# Patient Record
Sex: Male | Born: 1940 | Race: White | Hispanic: No | Marital: Married | Smoking: Former smoker
Health system: Southern US, Community
[De-identification: ages and names within clinical notes are randomized; demographics above are authoritative.]

## PROBLEM LIST (undated history)

## (undated) DIAGNOSIS — M1990 Unspecified osteoarthritis, unspecified site: Secondary | ICD-10-CM

## (undated) DIAGNOSIS — I351 Nonrheumatic aortic (valve) insufficiency: Secondary | ICD-10-CM

## (undated) DIAGNOSIS — E785 Hyperlipidemia, unspecified: Secondary | ICD-10-CM

## (undated) DIAGNOSIS — I82409 Acute embolism and thrombosis of unspecified deep veins of unspecified lower extremity: Secondary | ICD-10-CM

## (undated) DIAGNOSIS — Z720 Tobacco use: Secondary | ICD-10-CM

## (undated) DIAGNOSIS — I2510 Atherosclerotic heart disease of native coronary artery without angina pectoris: Secondary | ICD-10-CM

## (undated) DIAGNOSIS — IMO0001 Reserved for inherently not codable concepts without codable children: Secondary | ICD-10-CM

## (undated) DIAGNOSIS — I719 Aortic aneurysm of unspecified site, without rupture: Secondary | ICD-10-CM

## (undated) DIAGNOSIS — I4892 Unspecified atrial flutter: Secondary | ICD-10-CM

## (undated) DIAGNOSIS — I10 Essential (primary) hypertension: Secondary | ICD-10-CM

## (undated) DIAGNOSIS — Z9289 Personal history of other medical treatment: Secondary | ICD-10-CM

## (undated) DIAGNOSIS — I509 Heart failure, unspecified: Secondary | ICD-10-CM

## (undated) HISTORY — DX: Essential (primary) hypertension: I10

## (undated) HISTORY — DX: Aortic aneurysm of unspecified site, without rupture: I71.9

## (undated) HISTORY — PX: REPLACEMENT TOTAL KNEE: SUR1224

## (undated) HISTORY — PX: THORACENTESIS: SHX235

## (undated) HISTORY — PX: CARDIAC VALVE REPLACEMENT: SHX585

## (undated) HISTORY — DX: Tobacco use: Z72.0

## (undated) HISTORY — DX: Nonrheumatic aortic (valve) insufficiency: I35.1

## (undated) HISTORY — DX: Hyperlipidemia, unspecified: E78.5

## (undated) HISTORY — PX: CATARACT EXTRACTION W/ INTRAOCULAR LENS  IMPLANT, BILATERAL: SHX1307

## (undated) HISTORY — DX: Acute embolism and thrombosis of unspecified deep veins of unspecified lower extremity: I82.409

## (undated) HISTORY — PX: TONSILLECTOMY: SUR1361

## (undated) HISTORY — PX: EYE SURGERY: SHX253

## (undated) HISTORY — PX: JOINT REPLACEMENT: SHX530

## (undated) HISTORY — DX: Atherosclerotic heart disease of native coronary artery without angina pectoris: I25.10

## (undated) HISTORY — DX: Reserved for inherently not codable concepts without codable children: IMO0001

## (undated) HISTORY — PX: KNEE ARTHROSCOPY: SHX127

---

## 2009-02-18 ENCOUNTER — Encounter (INDEPENDENT_AMBULATORY_CARE_PROVIDER_SITE_OTHER): Payer: Self-pay | Admitting: Interventional Cardiology

## 2009-02-18 ENCOUNTER — Inpatient Hospital Stay (HOSPITAL_COMMUNITY): Admission: EM | Admit: 2009-02-18 | Discharge: 2009-02-28 | Payer: Self-pay | Admitting: Interventional Cardiology

## 2009-02-18 ENCOUNTER — Ambulatory Visit: Payer: Self-pay | Admitting: Cardiothoracic Surgery

## 2009-02-18 ENCOUNTER — Ambulatory Visit: Payer: Self-pay | Admitting: Pulmonary Disease

## 2009-02-18 ENCOUNTER — Encounter: Payer: Self-pay | Admitting: Cardiothoracic Surgery

## 2009-02-18 ENCOUNTER — Encounter (INDEPENDENT_AMBULATORY_CARE_PROVIDER_SITE_OTHER): Payer: Self-pay | Admitting: Cardiology

## 2009-02-19 ENCOUNTER — Encounter: Payer: Self-pay | Admitting: Cardiothoracic Surgery

## 2009-02-19 HISTORY — PX: AORTIC VALVE REPLACEMENT: SHX41

## 2009-02-23 ENCOUNTER — Encounter: Payer: Self-pay | Admitting: Cardiothoracic Surgery

## 2009-03-25 ENCOUNTER — Ambulatory Visit: Payer: Self-pay | Admitting: Cardiothoracic Surgery

## 2009-03-25 ENCOUNTER — Encounter: Admission: RE | Admit: 2009-03-25 | Discharge: 2009-03-25 | Payer: Self-pay | Admitting: Cardiothoracic Surgery

## 2009-03-25 ENCOUNTER — Ambulatory Visit (HOSPITAL_COMMUNITY): Admission: RE | Admit: 2009-03-25 | Discharge: 2009-03-25 | Payer: Self-pay | Admitting: Cardiothoracic Surgery

## 2009-03-25 ENCOUNTER — Encounter: Payer: Self-pay | Admitting: Cardiothoracic Surgery

## 2009-03-29 ENCOUNTER — Ambulatory Visit (HOSPITAL_COMMUNITY): Admission: RE | Admit: 2009-03-29 | Discharge: 2009-03-29 | Payer: Self-pay | Admitting: Cardiothoracic Surgery

## 2009-03-29 ENCOUNTER — Encounter (INDEPENDENT_AMBULATORY_CARE_PROVIDER_SITE_OTHER): Payer: Self-pay | Admitting: Interventional Radiology

## 2009-03-29 ENCOUNTER — Ambulatory Visit: Payer: Self-pay | Admitting: Cardiothoracic Surgery

## 2009-03-29 HISTORY — PX: OTHER SURGICAL HISTORY: SHX169

## 2009-04-01 ENCOUNTER — Ambulatory Visit: Payer: Self-pay | Admitting: Cardiothoracic Surgery

## 2009-04-01 ENCOUNTER — Encounter: Admission: RE | Admit: 2009-04-01 | Discharge: 2009-04-01 | Payer: Self-pay | Admitting: Cardiothoracic Surgery

## 2009-04-08 ENCOUNTER — Ambulatory Visit: Payer: Self-pay | Admitting: Cardiothoracic Surgery

## 2009-04-08 ENCOUNTER — Encounter: Admission: RE | Admit: 2009-04-08 | Discharge: 2009-04-08 | Payer: Self-pay | Admitting: Cardiothoracic Surgery

## 2009-04-13 ENCOUNTER — Ambulatory Visit (HOSPITAL_COMMUNITY): Admission: RE | Admit: 2009-04-13 | Discharge: 2009-04-13 | Payer: Self-pay | Admitting: Cardiothoracic Surgery

## 2009-04-22 ENCOUNTER — Encounter: Admission: RE | Admit: 2009-04-22 | Discharge: 2009-04-22 | Payer: Self-pay | Admitting: Cardiothoracic Surgery

## 2009-04-22 ENCOUNTER — Ambulatory Visit: Payer: Self-pay | Admitting: Cardiothoracic Surgery

## 2009-05-06 ENCOUNTER — Encounter: Admission: RE | Admit: 2009-05-06 | Discharge: 2009-05-06 | Payer: Self-pay | Admitting: Cardiothoracic Surgery

## 2009-05-06 ENCOUNTER — Ambulatory Visit: Payer: Self-pay | Admitting: Cardiothoracic Surgery

## 2009-08-12 ENCOUNTER — Encounter: Admission: RE | Admit: 2009-08-12 | Discharge: 2009-08-12 | Payer: Self-pay | Admitting: Cardiothoracic Surgery

## 2009-08-12 ENCOUNTER — Ambulatory Visit: Payer: Self-pay | Admitting: Cardiothoracic Surgery

## 2010-03-19 HISTORY — PX: AORTO-FEMORAL BYPASS GRAFT: SHX885

## 2010-03-31 ENCOUNTER — Ambulatory Visit: Payer: Self-pay | Admitting: Cardiothoracic Surgery

## 2010-03-31 ENCOUNTER — Encounter: Admission: RE | Admit: 2010-03-31 | Discharge: 2010-03-31 | Payer: Self-pay | Admitting: Cardiothoracic Surgery

## 2010-07-28 ENCOUNTER — Emergency Department (HOSPITAL_COMMUNITY): Payer: Medicare Other

## 2010-07-28 ENCOUNTER — Inpatient Hospital Stay (HOSPITAL_COMMUNITY)
Admission: EM | Admit: 2010-07-28 | Discharge: 2010-07-31 | DRG: 249 | Disposition: A | Discharge status: Home / Self Care | Payer: Medicare Other | Attending: Interventional Cardiology | Admitting: Interventional Cardiology

## 2010-07-28 DIAGNOSIS — E785 Hyperlipidemia, unspecified: Secondary | ICD-10-CM | POA: Diagnosis present

## 2010-07-28 DIAGNOSIS — Z7982 Long term (current) use of aspirin: Secondary | ICD-10-CM

## 2010-07-28 DIAGNOSIS — I059 Rheumatic mitral valve disease, unspecified: Secondary | ICD-10-CM | POA: Diagnosis present

## 2010-07-28 DIAGNOSIS — Z7902 Long term (current) use of antithrombotics/antiplatelets: Secondary | ICD-10-CM

## 2010-07-28 DIAGNOSIS — Z954 Presence of other heart-valve replacement: Secondary | ICD-10-CM

## 2010-07-28 DIAGNOSIS — I10 Essential (primary) hypertension: Secondary | ICD-10-CM | POA: Diagnosis present

## 2010-07-28 DIAGNOSIS — I2582 Chronic total occlusion of coronary artery: Secondary | ICD-10-CM | POA: Diagnosis present

## 2010-07-28 DIAGNOSIS — Z87891 Personal history of nicotine dependence: Secondary | ICD-10-CM

## 2010-07-28 DIAGNOSIS — R091 Pleurisy: Secondary | ICD-10-CM | POA: Diagnosis present

## 2010-07-28 DIAGNOSIS — I2510 Atherosclerotic heart disease of native coronary artery without angina pectoris: Principal | ICD-10-CM | POA: Diagnosis present

## 2010-07-28 LAB — COMPREHENSIVE METABOLIC PANEL
ALT: 14 U/L (ref 0–53)
AST: 23 U/L (ref 0–37)
Albumin: 4.2 g/dL (ref 3.5–5.2)
Alkaline Phosphatase: 56 U/L (ref 39–117)
Calcium: 9.2 mg/dL (ref 8.4–10.5)
GFR calc Af Amer: >60 mL/min (ref >60)
Glucose, Bld: 164 mg/dL — ABNORMAL HIGH (ref 70–99)
Potassium: 3.4 mEq/L — ABNORMAL LOW (ref 3.5–5.1)
Sodium: 137 mEq/L (ref 135–145)
Total Protein: 6.9 g/dL (ref 6.0–8.3)

## 2010-07-28 LAB — CBC
HCT: 36.4 % — ABNORMAL LOW (ref 39.0–52.0)
Hemoglobin: 12 g/dL — ABNORMAL LOW (ref 13.0–17.0)
MCH: 29.3 pg (ref 26.0–34.0)
MCHC: 33 g/dL (ref 30.0–36.0)
MCV: 89 fL (ref 78.0–100.0)
RDW: 14.2 % (ref 11.5–15.5)

## 2010-07-28 LAB — APTT: aPTT: 31 seconds (ref 24–37)

## 2010-07-28 LAB — CK TOTAL AND CKMB (NOT AT ARMC)
CK, MB: 2.4 ng/mL (ref 0.3–4.0)
Total CK: 122 U/L (ref 7–232)

## 2010-07-28 LAB — TYPE AND SCREEN

## 2010-07-28 LAB — PROTIME-INR
INR: 1.03 (ref 0.00–1.49)
Prothrombin Time: 13.7 seconds (ref 11.6–15.2)

## 2010-07-28 LAB — BRAIN NATRIURETIC PEPTIDE: Pro B Natriuretic peptide (BNP): 151 pg/mL — ABNORMAL HIGH (ref 0.0–100.0)

## 2010-07-28 LAB — TROPONIN I: Troponin I: 0.01 ng/mL (ref 0.00–0.06)

## 2010-07-28 MED ORDER — IOHEXOL 300 MG/ML  SOLN
100.0000 mL | Freq: Once | INTRAMUSCULAR | Status: AC | PRN
  Administered 2010-07-28: 100 mL via INTRAVENOUS

## 2010-07-29 LAB — CARDIAC PANEL(CRET KIN+CKTOT+MB+TROPI)
CK, MB: 2 ng/mL (ref 0.3–4.0)
CK, MB: 3.5 ng/mL (ref 0.3–4.0)
Total CK: 95 U/L (ref 7–232)
Total CK: 102 U/L (ref 7–232)
Troponin I: 0.19 ng/mL — ABNORMAL HIGH (ref 0.00–0.06)

## 2010-07-29 LAB — DIFFERENTIAL
Basophils Relative: 0 % (ref 0–1)
Eosinophils Absolute: 0 10*3/uL (ref 0.0–0.7)
Eosinophils Relative: 0 % (ref 0–5)
Lymphs Abs: 0.7 10*3/uL (ref 0.7–4.0)

## 2010-07-29 LAB — CBC
MCH: 28.7 pg (ref 26.0–34.0)
MCH: 28.9 pg (ref 26.0–34.0)
MCHC: 32.2 g/dL (ref 30.0–36.0)
MCV: 87.9 fL (ref 78.0–100.0)
Platelets: 195 10*3/uL (ref 150–400)
Platelets: 208 10*3/uL (ref 150–400)
RBC: 3.53 MIL/uL — ABNORMAL LOW (ref 4.22–5.81)
RDW: 14.3 % (ref 11.5–15.5)
WBC: 6.5 10*3/uL (ref 4.0–10.5)

## 2010-07-29 LAB — BASIC METABOLIC PANEL
Calcium: 8.1 mg/dL — ABNORMAL LOW (ref 8.4–10.5)
Creatinine, Ser: 0.77 mg/dL (ref 0.4–1.5)
GFR calc Af Amer: >60 mL/min (ref >60)
GFR calc non Af Amer: >60 mL/min (ref >60)

## 2010-07-29 LAB — LIPID PANEL
Cholesterol: 136 mg/dL (ref 0–200)
LDL Cholesterol: 80 mg/dL (ref 0–99)

## 2010-07-29 LAB — HEMOGLOBIN A1C: Mean Plasma Glucose: 123 mg/dL — ABNORMAL HIGH (ref <117)

## 2010-07-29 LAB — MRSA PCR SCREENING: MRSA by PCR: NEGATIVE

## 2010-07-30 LAB — CBC
HCT: 32.6 % — ABNORMAL LOW (ref 39.0–52.0)
Hemoglobin: 10.5 g/dL — ABNORMAL LOW (ref 13.0–17.0)
MCH: 29.2 pg (ref 26.0–34.0)
MCHC: 32.2 g/dL (ref 30.0–36.0)

## 2010-07-30 LAB — BASIC METABOLIC PANEL
CO2: 28 mEq/L (ref 19–32)
Calcium: 8.7 mg/dL (ref 8.4–10.5)
Glucose, Bld: 111 mg/dL — ABNORMAL HIGH (ref 70–99)
Sodium: 142 mEq/L (ref 135–145)

## 2010-08-01 NOTE — Discharge Summary (Signed)
[name] [name], [name] [name]             ACCOUNT NO.:  [id number]  MEDICAL RECORD NO.:  [id number]           PATIENT TYPE:  I  LOCATION:  2031                         FACILITY:  [facility]  PHYSICIAN:  Jake Bathe, MD      DATE OF BIRTH:  [birth year]  DATE OF ADMISSION:  07/28/2010 DATE OF DISCHARGE:  07/31/2010                              DISCHARGE SUMMARY   CARDIOLOGIST:  [name], MD  FINAL DIAGNOSES: 1. Chest pain - pleuritic chest pain, cough, T-max was 100.1, resolved     with ibuprofen, improved. 2. Coronary artery disease - percutaneous intervention with bare-metal     stent to circumflex artery 4.0 x 23 mm Vision stent.  Mid right     coronary artery is occluded.  Widely patent left anterior     descending.  Left ventriculogram not performed during     catheterization; however, on echocardiogram, ejection fraction 50-     55% with mild focal basal hypertrophy of the septum. 3. 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 4. Hypertension. 5. Hyperlipidemia.  PERTINENT LABORATORIES:  On discharge, sodium 142, potassium 3.8, BUN 16, creatinine 0.8.  White count 7.7, hemoglobin 11.5, hematocrit 32.6, platelets 205, hemoglobin A1c 5.9.  TSH was 0.6 normal.  Cardiac panel, last set on March 29, 2011, at 9:43 a.m. demonstrating mildly elevated troponin at 0.19.  This was following intervention and with continued pleuritic chest pain.  CK-MB was normal.  All other troponins were normal.  LDL cholesterol was 80 with a goal of 70, triglycerides 89, HDL 38.  BNP on admission was 151.  CT angiogram of chest done on July 28, 2010, showed no evidence of PE.  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 Laneta Simmers.  No evidence of aortic dissection.  No pericardial or pleural  effusions.  No adenopathy.  HOSPITAL COURSE:  This 70 year old man with a complex cardiac history including a large ascending aortic aneurysm repair on February 19, 2009, with bioprosthetic aortic valve replacement and reimplantation of coronary arteries who presented with diaphoresis, chest pain, near syncope, and appearing quite ill.  He was taken to the cardiac catheterization lab where his circumflex artery was noted to be highly stenotic and a bare-metal stent was placed in this region as described above by Dr. Katrinka Blazing.  Following the procedure, he still continued to have pleuritic-type chest pain that was worse with deep inspiration and cough.  He had no evidence of dissection or PE on CT scan and no evidence of pericardial effusion on CT scan.  Throughout the hospitalization, his pleuritic-like chest pain began to dissipate and interestingly on Saturday day prior to discharge in the early morning hours, he did develop a minor temperature of 100.1, which has resolved on discharge.  White count was all normal.  Ibuprofen 800 mg 3 times a day was administered with an adequate response.  On the day of discharge, he was able to ambulate the hallway without any difficulty and was feeling  quite better.  Actually on Saturday, day prior to discharge, he was looking remarkably better.  Just prior to the hospitalization, he lost his very close family member.  With bare-metal stent, Plavix is mandatory for at least 1 month, but optimally for 1 year.  His right coronary artery also was occluded in the mid vessel with right to right collateralization noted. Echocardiogram showed low normal ejection fraction overall. Bioprosthetic aortic valve was functioning normally.  DISCHARGE MEDICATIONS: 1. Aspirin 325 mg once a day. 2. Clopidogrel 75 mg once a day. 3. Ibuprofen 800 mg 3 times a day for 5 days. 4. Metoprolol 50 mg twice a day (this is an increased from 25 twice a     day). 5.  Nitroglycerin 0.4 mg p.r.n. 6. Frusemide 40 mg a day. 7. Loratadine 10 mg a day. 8. Norvasc 5 mg a day. 9. Potassium chloride 20 mEq a day. 10.Prevacid 30 mg a day. 11.Vasotec or enalapril 5 mg a day. 12.Zocor or simvastatin 20 mg a day.  On discharge, his blood pressure was 109/60 to 116/64, temperature was 98.7, pulse was 70.  Telemetry was normal without any arrhythmias and he is satting 95% on room air, ambulating well.  There was no rub on physical exam.  Lungs were clear.  Groin was clean, dry, and intact.  DISCHARGE INSTRUCTIONS:  He knows to perform postcatheterization instructions per protocol and to return to clinic in 1 week to see [name].  He knows to contact us if any worrisome symptoms develop.  Discharge time 35 minutes spent with the patient, med reconciliation, medication review, lab review, and chart review.     Jake Bathe, MD     MCS/MEDQ  D:  07/31/2010  T:  08/01/2010  Job:  [id number]  Electronically Signed by Donato Schultz MD on 08/01/2010 06:36:39 AM

## 2010-08-26 NOTE — Procedures (Signed)
NAMECLEAVEN, DEMARIO NO.:  [id number]  MEDICAL RECORD NO.:  [id number]           PATIENT TYPE:  I  LOCATION:  2905                         FACILITY:  [facility]  PHYSICIAN:  Lyn Records, M.D.   DATE OF BIRTH:  13-Mar-1941  DATE OF PROCEDURE:  07/28/2010 DATE OF DISCHARGE:                           CARDIAC CATHETERIZATION   CARDIAC CATHETERIZATION AND PERCUTANEOUS CORONARY INTERVENTION NOTE  INDICATION:  The patient is a 70 year old gentleman with a complex cardiac history including prior ascending aortic root resection including partial arch resection for aortic aneurysm.  He also has a bioprosthetic aortic valve done at the same time in 2010.  This afternoon at around 1:30, he suddenly developed severe discomfort beginning in his chest and radiating through to his back.  He became ashen, diaphoretic, and dyspneic.  He came to the emergency room where multiple procedures were done including a CT angio which ruled out pulmonary emboli and aortic dissection.  Cardiology was eventually called at around 6:30 p.m., and the patient was still having chest pain. He had mild diffuse ST-segment depression.  IV nitroglycerin and heparin were administered.  The pain lessened some but continued.  Because of this, he is brought to the cath lab urgently for definition of coronary anatomy.  My concern was that he may have ostial coronary stenosis at the coronary reimplantation sites.  PROCEDURES PERFORMED: 1. Left heart cath. 2. Selective left and right coronary angiography. 3. No ventriculography. 4. Bare-metal stent, mid circumflex.  DESCRIPTION:  Left femoral was used.  A 1% Xylocaine was used for local anesthesia.  We then performed diagnostic catheterization with 5-French equipment.  A 5-French sheath was placed using the modified Seldinger technique.  A 5-French #4 left Judkins catheter was used for left coronary angiography.  Because of the orientation of the  patient's aorta, cannulation of the left coronary ostia was difficult.  Left main was also short, so we had difficulty visualizing the circumflex. Multiple catheter changes were performed, but we were ultimately able to visualize the circumflex using a 4.5, 6-French guide catheter, left Judkins.  We used a no torque right for right coronary angiography. After studying the anatomy which demonstrated total occlusion of the RCA and a high-grade stenosis in the mid circumflex with a widely patent LAD, I felt that PCI would be the treatment of choice.  I discussed this with the patient and family and because he was having persisting pain, we decided to proceed with the procedure which would include stenting of the circumflex.  We gave a bolus followed by an infusion of Angiomax.  Versed and fentanyl were given for sedation.  Please see the medication log.  We then tried multiple catheters in an attempt to get a good guide seating.  We ultimately ended up using an SB 4.0, 6-French left coronary system guide catheter.  The BMW wire was then used to cross the stenosis.  Predilatation was performed with a 3.5 x 59-mm Apex.  A 23-mm long x 4.0 mm diameter Vision stent was deployed to 15 atmospheres. Postdilatation was performed with a 4.5 x 20 [place] Apex to 13 atmospheres. Brisk flow  was noted.  No complications occurred.  The guide catheter and wire were removed.  The sheath was sewn in place.  Angiomax was discontinued.  The patient was taken to the coronary care unit in stable condition.  RESULTS: 1. Hemodynamic data:     a.     Aortic pressure 134/66.     b.     Left ventricular pressure:  Left ventriculography and      hemodynamic recordings were not performed. 2. Coronary angiography.     a.     The left main coronary:  Short but widely patent.     b.     Left anterior descending coronary:  Widely patent.     c.     Circumflex coronary artery:  Mid vessel segmental 95%      eccentric  stenosis with a large distal distribution and      collaterals to the right coronary.     d.     Right coronary:  A 100% midvessel stenosis with left-to-      right and right-to-right collaterals noted. 3. Circumflex PCI:  The circumflex lesion is eccentric and greater     than 90% with TIMI grade 3 flow.  After ballooning and deployment     of a 4.0 x 23 Vision stent, postdilated to 4.5 mm, 0% stenosis was     noted with TIMI grade 3 flow.  CONCLUSIONS: 1. Acute coronary syndrome due to high-grade obstruction in the     midcircumflex. 2. Successful bare-metal stenting of the mid circumflex from 95% to 0%     with TIMI grade 3 flow. 3. A 100% mid right coronary artery. 4. Left ventriculography not performed. 5. Widely patent left anterior descending.  PLAN:  Aspirin and Plavix.  Angiomax is discontinued.     Lyn Records, M.D.     HWS/MEDQ  D:  07/28/2010  T:  07/29/2010  Job:  [id number]  cc:   [name], MD  Electronically Signed by Verdis Prime M.D. on 08/25/2010 09:27:15 AM

## 2010-09-22 LAB — CULTURE, ROUTINE-ABSCESS
Culture: NO GROWTH
Gram Stain: NONE SEEN

## 2010-09-22 LAB — BODY FLUID CULTURE
Culture: NO GROWTH
Gram Stain: NONE SEEN

## 2010-09-22 LAB — PROTIME-INR
INR: 1.11 (ref 0.00–1.49)
INR: 1.28 (ref 0.00–1.49)
Prothrombin Time: 14.2 seconds (ref 11.6–15.2)
Prothrombin Time: 15.9 seconds — ABNORMAL HIGH (ref 11.6–15.2)

## 2010-09-23 LAB — BLOOD GAS, ARTERIAL
Acid-Base Excess: 0.6 mmol/L (ref 0.0–2.0)
Bicarbonate: 24.7 mEq/L — ABNORMAL HIGH (ref 20.0–24.0)
Drawn by: 275531
FIO2: 100 %
MECHVT: 550 mL
O2 Saturation: 100 %
O2 Saturation: 98.2 %
PEEP: 5 cmH2O
Patient temperature: 98.6
Patient temperature: 98.6
RATE: 18 resp/min
TCO2: 24.8 mmol/L (ref 0–100)
TCO2: 25.5 mmol/L (ref 0–100)
pCO2 arterial: 32.7 mmHg — ABNORMAL LOW (ref 35.0–45.0)
pH, Arterial: 7.561 — ABNORMAL HIGH (ref 7.350–7.450)
pO2, Arterial: 82.5 mmHg (ref 80.0–100.0)

## 2010-09-23 LAB — TYPE AND SCREEN
ABO/RH(D): A POS
Antibody Screen: NEGATIVE

## 2010-09-23 LAB — GLUCOSE, CAPILLARY
Glucose-Capillary: 102 mg/dL — ABNORMAL HIGH (ref 70–99)
Glucose-Capillary: 104 mg/dL — ABNORMAL HIGH (ref 70–99)
Glucose-Capillary: 120 mg/dL — ABNORMAL HIGH (ref 70–99)
Glucose-Capillary: 120 mg/dL — ABNORMAL HIGH (ref 70–99)
Glucose-Capillary: 125 mg/dL — ABNORMAL HIGH (ref 70–99)
Glucose-Capillary: 129 mg/dL — ABNORMAL HIGH (ref 70–99)
Glucose-Capillary: 130 mg/dL — ABNORMAL HIGH (ref 70–99)
Glucose-Capillary: 133 mg/dL — ABNORMAL HIGH (ref 70–99)
Glucose-Capillary: 133 mg/dL — ABNORMAL HIGH (ref 70–99)
Glucose-Capillary: 139 mg/dL — ABNORMAL HIGH (ref 70–99)
Glucose-Capillary: 146 mg/dL — ABNORMAL HIGH (ref 70–99)
Glucose-Capillary: 78 mg/dL (ref 70–99)
Glucose-Capillary: 87 mg/dL (ref 70–99)
Glucose-Capillary: 97 mg/dL (ref 70–99)
Glucose-Capillary: 99 mg/dL (ref 70–99)

## 2010-09-23 LAB — CBC
HCT: 27.7 % — ABNORMAL LOW (ref 39.0–52.0)
HCT: 27.7 % — ABNORMAL LOW (ref 39.0–52.0)
HCT: 27.9 % — ABNORMAL LOW (ref 39.0–52.0)
HCT: 28.1 % — ABNORMAL LOW (ref 39.0–52.0)
HCT: 29 % — ABNORMAL LOW (ref 39.0–52.0)
HCT: 29.4 % — ABNORMAL LOW (ref 39.0–52.0)
HCT: 30 % — ABNORMAL LOW (ref 39.0–52.0)
HCT: 32.7 % — ABNORMAL LOW (ref 39.0–52.0)
HCT: 37 % — ABNORMAL LOW (ref 39.0–52.0)
Hemoglobin: 10.1 g/dL — ABNORMAL LOW (ref 13.0–17.0)
Hemoglobin: 10.1 g/dL — ABNORMAL LOW (ref 13.0–17.0)
Hemoglobin: 10.5 g/dL — ABNORMAL LOW (ref 13.0–17.0)
Hemoglobin: 11 g/dL — ABNORMAL LOW (ref 13.0–17.0)
Hemoglobin: 8.4 g/dL — ABNORMAL LOW (ref 13.0–17.0)
Hemoglobin: 9.3 g/dL — ABNORMAL LOW (ref 13.0–17.0)
Hemoglobin: 9.4 g/dL — ABNORMAL LOW (ref 13.0–17.0)
Hemoglobin: 9.4 g/dL — ABNORMAL LOW (ref 13.0–17.0)
Hemoglobin: 9.4 g/dL — ABNORMAL LOW (ref 13.0–17.0)
Hemoglobin: 9.9 g/dL — ABNORMAL LOW (ref 13.0–17.0)
MCHC: 33.1 g/dL (ref 30.0–36.0)
MCHC: 33.3 g/dL (ref 30.0–36.0)
MCHC: 33.4 g/dL (ref 30.0–36.0)
MCHC: 33.5 g/dL (ref 30.0–36.0)
MCHC: 33.6 g/dL (ref 30.0–36.0)
MCHC: 33.7 g/dL (ref 30.0–36.0)
MCHC: 33.7 g/dL (ref 30.0–36.0)
MCHC: 33.7 g/dL (ref 30.0–36.0)
MCHC: 33.9 g/dL (ref 30.0–36.0)
MCHC: 34.1 g/dL (ref 30.0–36.0)
MCHC: 34.3 g/dL (ref 30.0–36.0)
MCV: 94.4 fL (ref 78.0–100.0)
MCV: 95.3 fL (ref 78.0–100.0)
MCV: 95.3 fL (ref 78.0–100.0)
MCV: 95.3 fL (ref 78.0–100.0)
MCV: 95.3 fL (ref 78.0–100.0)
MCV: 95.5 fL (ref 78.0–100.0)
MCV: 96.1 fL (ref 78.0–100.0)
MCV: 96.2 fL (ref 78.0–100.0)
MCV: 96.3 fL (ref 78.0–100.0)
MCV: 96.3 fL (ref 78.0–100.0)
MCV: 96.5 fL (ref 78.0–100.0)
Platelets: 104 10*3/uL — ABNORMAL LOW (ref 150–400)
Platelets: 139 10*3/uL — ABNORMAL LOW (ref 150–400)
Platelets: 260 10*3/uL (ref 150–400)
Platelets: 68 10*3/uL — ABNORMAL LOW (ref 150–400)
Platelets: 74 10*3/uL — ABNORMAL LOW (ref 150–400)
Platelets: 76 10*3/uL — ABNORMAL LOW (ref 150–400)
Platelets: 86 10*3/uL — ABNORMAL LOW (ref 150–400)
Platelets: 87 10*3/uL — ABNORMAL LOW (ref 150–400)
Platelets: 96 10*3/uL — ABNORMAL LOW (ref 150–400)
RBC: 2.65 MIL/uL — ABNORMAL LOW (ref 4.22–5.81)
RBC: 2.87 MIL/uL — ABNORMAL LOW (ref 4.22–5.81)
RBC: 2.9 MIL/uL — ABNORMAL LOW (ref 4.22–5.81)
RBC: 2.9 MIL/uL — ABNORMAL LOW (ref 4.22–5.81)
RBC: 2.95 MIL/uL — ABNORMAL LOW (ref 4.22–5.81)
RBC: 3.01 MIL/uL — ABNORMAL LOW (ref 4.22–5.81)
RBC: 3.12 MIL/uL — ABNORMAL LOW (ref 4.22–5.81)
RBC: 3.15 MIL/uL — ABNORMAL LOW (ref 4.22–5.81)
RBC: 3.22 MIL/uL — ABNORMAL LOW (ref 4.22–5.81)
RBC: 3.43 MIL/uL — ABNORMAL LOW (ref 4.22–5.81)
RDW: 14.1 % (ref 11.5–15.5)
RDW: 14.2 % (ref 11.5–15.5)
RDW: 14.6 % (ref 11.5–15.5)
RDW: 14.7 % (ref 11.5–15.5)
RDW: 14.8 % (ref 11.5–15.5)
RDW: 14.8 % (ref 11.5–15.5)
RDW: 15 % (ref 11.5–15.5)
RDW: 15.2 % (ref 11.5–15.5)
RDW: 15.2 % (ref 11.5–15.5)
RDW: 15.5 % (ref 11.5–15.5)
RDW: 15.6 % — ABNORMAL HIGH (ref 11.5–15.5)
WBC: 10.8 10*3/uL — ABNORMAL HIGH (ref 4.0–10.5)
WBC: 11.1 10*3/uL — ABNORMAL HIGH (ref 4.0–10.5)
WBC: 11.2 10*3/uL — ABNORMAL HIGH (ref 4.0–10.5)
WBC: 11.7 10*3/uL — ABNORMAL HIGH (ref 4.0–10.5)
WBC: 12 10*3/uL — ABNORMAL HIGH (ref 4.0–10.5)
WBC: 12.8 10*3/uL — ABNORMAL HIGH (ref 4.0–10.5)
WBC: 14.5 10*3/uL — ABNORMAL HIGH (ref 4.0–10.5)
WBC: 9.1 10*3/uL (ref 4.0–10.5)

## 2010-09-23 LAB — URINE MICROSCOPIC-ADD ON

## 2010-09-23 LAB — POCT I-STAT 3, ART BLOOD GAS (G3+)
Acid-base deficit: 1 mmol/L (ref 0.0–2.0)
Acid-base deficit: 2 mmol/L (ref 0.0–2.0)
Acid-base deficit: 3 mmol/L — ABNORMAL HIGH (ref 0.0–2.0)
Acid-base deficit: 7 mmol/L — ABNORMAL HIGH (ref 0.0–2.0)
Bicarbonate: 22.2 mEq/L (ref 20.0–24.0)
Bicarbonate: 23.7 mEq/L (ref 20.0–24.0)
Bicarbonate: 24.8 mEq/L — ABNORMAL HIGH (ref 20.0–24.0)
Bicarbonate: 24.9 mEq/L — ABNORMAL HIGH (ref 20.0–24.0)
Bicarbonate: 25.5 mEq/L — ABNORMAL HIGH (ref 20.0–24.0)
O2 Saturation: 100 %
O2 Saturation: 100 %
O2 Saturation: 100 %
O2 Saturation: 96 %
O2 Saturation: 99 %
Patient temperature: 36.6
Patient temperature: 37.2
TCO2: 24 mmol/L (ref 0–100)
TCO2: 25 mmol/L (ref 0–100)
TCO2: 26 mmol/L (ref 0–100)
TCO2: 26 mmol/L (ref 0–100)
TCO2: 27 mmol/L (ref 0–100)
pCO2 arterial: 29.4 mmHg — ABNORMAL LOW (ref 35.0–45.0)
pCO2 arterial: 48.7 mmHg — ABNORMAL HIGH (ref 35.0–45.0)
pCO2 arterial: 61.9 mmHg (ref 35.0–45.0)
pH, Arterial: 7.515 — ABNORMAL HIGH (ref 7.350–7.450)
pO2, Arterial: 235 mmHg — ABNORMAL HIGH (ref 80.0–100.0)
pO2, Arterial: 82 mmHg (ref 80.0–100.0)

## 2010-09-23 LAB — PREPARE FRESH FROZEN PLASMA

## 2010-09-23 LAB — COMPREHENSIVE METABOLIC PANEL
ALT: 47 U/L (ref 0–53)
ALT: 50 U/L (ref 0–53)
AST: 140 U/L — ABNORMAL HIGH (ref 0–37)
AST: 316 U/L — ABNORMAL HIGH (ref 0–37)
Albumin: 2.5 g/dL — ABNORMAL LOW (ref 3.5–5.2)
Albumin: 2.5 g/dL — ABNORMAL LOW (ref 3.5–5.2)
Albumin: 3.6 g/dL (ref 3.5–5.2)
Alkaline Phosphatase: 32 U/L — ABNORMAL LOW (ref 39–117)
Alkaline Phosphatase: 33 U/L — ABNORMAL LOW (ref 39–117)
BUN: 20 mg/dL (ref 6–23)
BUN: 32 mg/dL — ABNORMAL HIGH (ref 6–23)
BUN: 9 mg/dL (ref 6–23)
CO2: 25 mEq/L (ref 19–32)
CO2: 27 mEq/L (ref 19–32)
Calcium: 7.4 mg/dL — ABNORMAL LOW (ref 8.4–10.5)
Calcium: 7.5 mg/dL — ABNORMAL LOW (ref 8.4–10.5)
Calcium: 8.4 mg/dL (ref 8.4–10.5)
Chloride: 105 mEq/L (ref 96–112)
Chloride: 108 mEq/L (ref 96–112)
Chloride: 110 mEq/L (ref 96–112)
Creatinine, Ser: 0.67 mg/dL (ref 0.4–1.5)
Creatinine, Ser: 0.98 mg/dL (ref 0.4–1.5)
Creatinine, Ser: 1.3 mg/dL (ref 0.4–1.5)
GFR calc Af Amer: >60 mL/min (ref >60)
GFR calc Af Amer: >60 mL/min (ref >60)
GFR calc Af Amer: >60 mL/min (ref >60)
GFR calc non Af Amer: 55 mL/min — ABNORMAL LOW (ref >60)
GFR calc non Af Amer: >60 mL/min (ref >60)
Glucose, Bld: 120 mg/dL — ABNORMAL HIGH (ref 70–99)
Glucose, Bld: 133 mg/dL — ABNORMAL HIGH (ref 70–99)
Potassium: 3.5 mEq/L (ref 3.5–5.1)
Potassium: 4.2 mEq/L (ref 3.5–5.1)
Sodium: 141 mEq/L (ref 135–145)
Sodium: 142 mEq/L (ref 135–145)
Total Bilirubin: 0.7 mg/dL (ref 0.3–1.2)
Total Bilirubin: 0.8 mg/dL (ref 0.3–1.2)
Total Bilirubin: 1.9 mg/dL — ABNORMAL HIGH (ref 0.3–1.2)
Total Protein: 4.2 g/dL — ABNORMAL LOW (ref 6.0–8.3)
Total Protein: 4.7 g/dL — ABNORMAL LOW (ref 6.0–8.3)
Total Protein: 6.5 g/dL (ref 6.0–8.3)

## 2010-09-23 LAB — POCT I-STAT, CHEM 8
BUN: 12 mg/dL (ref 6–23)
BUN: 13 mg/dL (ref 6–23)
BUN: 21 mg/dL (ref 6–23)
BUN: 49 mg/dL — ABNORMAL HIGH (ref 6–23)
Calcium, Ion: 0.82 mmol/L — ABNORMAL LOW (ref 1.12–1.32)
Calcium, Ion: 1.02 mmol/L — ABNORMAL LOW (ref 1.12–1.32)
Calcium, Ion: 1.04 mmol/L — ABNORMAL LOW (ref 1.12–1.32)
Calcium, Ion: 1.06 mmol/L — ABNORMAL LOW (ref 1.12–1.32)
Chloride: 106 mEq/L (ref 96–112)
Chloride: 107 mEq/L (ref 96–112)
Chloride: 115 mEq/L — ABNORMAL HIGH (ref 96–112)
Creatinine, Ser: 0.8 mg/dL (ref 0.4–1.5)
Creatinine, Ser: 1.1 mg/dL (ref 0.4–1.5)
Glucose, Bld: 113 mg/dL — ABNORMAL HIGH (ref 70–99)
Glucose, Bld: 117 mg/dL — ABNORMAL HIGH (ref 70–99)
Glucose, Bld: 159 mg/dL — ABNORMAL HIGH (ref 70–99)
HCT: 29 % — ABNORMAL LOW (ref 39.0–52.0)
HCT: 29 % — ABNORMAL LOW (ref 39.0–52.0)
HCT: 31 % — ABNORMAL LOW (ref 39.0–52.0)
Hemoglobin: 10.5 g/dL — ABNORMAL LOW (ref 13.0–17.0)
Hemoglobin: 12.2 g/dL — ABNORMAL LOW (ref 13.0–17.0)
Potassium: 3.3 mEq/L — ABNORMAL LOW (ref 3.5–5.1)
Potassium: 3.5 mEq/L (ref 3.5–5.1)
Potassium: 4.2 mEq/L (ref 3.5–5.1)
Sodium: 138 mEq/L (ref 135–145)
Sodium: 141 mEq/L (ref 135–145)
TCO2: 16 mmol/L (ref 0–100)
TCO2: 23 mmol/L (ref 0–100)
TCO2: 25 mmol/L (ref 0–100)
TCO2: 25 mmol/L (ref 0–100)

## 2010-09-23 LAB — BASIC METABOLIC PANEL
BUN: 19 mg/dL (ref 6–23)
BUN: 27 mg/dL — ABNORMAL HIGH (ref 6–23)
BUN: 36 mg/dL — ABNORMAL HIGH (ref 6–23)
BUN: 47 mg/dL — ABNORMAL HIGH (ref 6–23)
BUN: 48 mg/dL — ABNORMAL HIGH (ref 6–23)
CO2: 24 mEq/L (ref 19–32)
CO2: 27 mEq/L (ref 19–32)
CO2: 27 mEq/L (ref 19–32)
CO2: 28 mEq/L (ref 19–32)
CO2: 29 mEq/L (ref 19–32)
CO2: 30 mEq/L (ref 19–32)
CO2: 32 mEq/L (ref 19–32)
Calcium: 7.4 mg/dL — ABNORMAL LOW (ref 8.4–10.5)
Calcium: 7.5 mg/dL — ABNORMAL LOW (ref 8.4–10.5)
Calcium: 7.6 mg/dL — ABNORMAL LOW (ref 8.4–10.5)
Calcium: 7.9 mg/dL — ABNORMAL LOW (ref 8.4–10.5)
Calcium: 8.1 mg/dL — ABNORMAL LOW (ref 8.4–10.5)
Calcium: 8.4 mg/dL (ref 8.4–10.5)
Chloride: 105 mEq/L (ref 96–112)
Chloride: 105 mEq/L (ref 96–112)
Chloride: 106 mEq/L (ref 96–112)
Chloride: 108 mEq/L (ref 96–112)
Chloride: 109 mEq/L (ref 96–112)
Chloride: 109 mEq/L (ref 96–112)
Creatinine, Ser: 0.87 mg/dL (ref 0.4–1.5)
Creatinine, Ser: 0.87 mg/dL (ref 0.4–1.5)
Creatinine, Ser: 0.91 mg/dL (ref 0.4–1.5)
Creatinine, Ser: 1.03 mg/dL (ref 0.4–1.5)
Creatinine, Ser: 1.11 mg/dL (ref 0.4–1.5)
Creatinine, Ser: 1.45 mg/dL (ref 0.4–1.5)
Creatinine, Ser: 1.71 mg/dL — ABNORMAL HIGH (ref 0.4–1.5)
GFR calc Af Amer: 48 mL/min — ABNORMAL LOW (ref >60)
GFR calc Af Amer: 58 mL/min — ABNORMAL LOW (ref >60)
GFR calc Af Amer: >60 mL/min (ref >60)
GFR calc Af Amer: >60 mL/min (ref >60)
GFR calc Af Amer: >60 mL/min (ref >60)
GFR calc Af Amer: >60 mL/min (ref >60)
GFR calc Af Amer: >60 mL/min (ref >60)
GFR calc non Af Amer: 40 mL/min — ABNORMAL LOW (ref >60)
GFR calc non Af Amer: 48 mL/min — ABNORMAL LOW (ref >60)
GFR calc non Af Amer: >60 mL/min (ref >60)
GFR calc non Af Amer: >60 mL/min (ref >60)
GFR calc non Af Amer: >60 mL/min (ref >60)
GFR calc non Af Amer: >60 mL/min (ref >60)
Glucose, Bld: 105 mg/dL — ABNORMAL HIGH (ref 70–99)
Glucose, Bld: 108 mg/dL — ABNORMAL HIGH (ref 70–99)
Glucose, Bld: 114 mg/dL — ABNORMAL HIGH (ref 70–99)
Glucose, Bld: 114 mg/dL — ABNORMAL HIGH (ref 70–99)
Glucose, Bld: 124 mg/dL — ABNORMAL HIGH (ref 70–99)
Glucose, Bld: 134 mg/dL — ABNORMAL HIGH (ref 70–99)
Potassium: 3.3 mEq/L — ABNORMAL LOW (ref 3.5–5.1)
Potassium: 3.7 mEq/L (ref 3.5–5.1)
Potassium: 3.8 mEq/L (ref 3.5–5.1)
Potassium: 3.9 mEq/L (ref 3.5–5.1)
Potassium: 4 mEq/L (ref 3.5–5.1)
Sodium: 137 mEq/L (ref 135–145)
Sodium: 139 mEq/L (ref 135–145)
Sodium: 141 mEq/L (ref 135–145)
Sodium: 142 mEq/L (ref 135–145)
Sodium: 143 mEq/L (ref 135–145)
Sodium: 144 mEq/L (ref 135–145)
Sodium: 147 mEq/L — ABNORMAL HIGH (ref 135–145)

## 2010-09-23 LAB — PREPARE PLATELETS

## 2010-09-23 LAB — CREATININE, SERUM
Creatinine, Ser: 1.03 mg/dL (ref 0.4–1.5)
GFR calc Af Amer: >60 mL/min (ref >60)
GFR calc non Af Amer: >60 mL/min (ref >60)

## 2010-09-23 LAB — DIFFERENTIAL
Basophils Relative: 0 % (ref 0–1)
Eosinophils Absolute: 0 10*3/uL (ref 0.0–0.7)
Eosinophils Relative: 0 % (ref 0–5)
Lymphocytes Relative: 6 % — ABNORMAL LOW (ref 12–46)
Lymphs Abs: 0.7 10*3/uL (ref 0.7–4.0)
Monocytes Absolute: 0.8 10*3/uL (ref 0.1–1.0)
Neutro Abs: 11 10*3/uL — ABNORMAL HIGH (ref 1.7–7.7)

## 2010-09-23 LAB — PROTIME-INR
INR: 1.2 (ref 0.00–1.49)
INR: 1.5 (ref 0.00–1.49)
INR: 1.6 — ABNORMAL HIGH (ref 0.00–1.49)
INR: 1.9 — ABNORMAL HIGH (ref 0.00–1.49)
INR: 1.9 — ABNORMAL HIGH (ref 0.00–1.49)
INR: 2.2 — ABNORMAL HIGH (ref 0.00–1.49)
Prothrombin Time: 14.8 seconds (ref 11.6–15.2)
Prothrombin Time: 17.8 seconds — ABNORMAL HIGH (ref 11.6–15.2)
Prothrombin Time: 18.7 seconds — ABNORMAL HIGH (ref 11.6–15.2)
Prothrombin Time: 21.5 seconds — ABNORMAL HIGH (ref 11.6–15.2)
Prothrombin Time: 21.7 seconds — ABNORMAL HIGH (ref 11.6–15.2)
Prothrombin Time: 23.9 seconds — ABNORMAL HIGH (ref 11.6–15.2)

## 2010-09-23 LAB — CARDIAC PANEL(CRET KIN+CKTOT+MB+TROPI)
CK, MB: 101.9 ng/mL — ABNORMAL HIGH (ref 0.3–4.0)
CK, MB: 223.9 ng/mL — ABNORMAL HIGH (ref 0.3–4.0)
CK, MB: 4.5 ng/mL — ABNORMAL HIGH (ref 0.3–4.0)
CK, MB: 9.9 ng/mL — ABNORMAL HIGH (ref 0.3–4.0)
Relative Index: 4.5 — ABNORMAL HIGH (ref 0.0–2.5)
Relative Index: 4.9 — ABNORMAL HIGH (ref 0.0–2.5)
Relative Index: 8.2 — ABNORMAL HIGH (ref 0.0–2.5)
Total CK: 169 U/L (ref 7–232)
Total CK: 171 U/L (ref 7–232)
Total CK: 202 U/L (ref 7–232)
Total CK: 2264 U/L — ABNORMAL HIGH (ref 7–232)
Total CK: 2719 U/L — ABNORMAL HIGH (ref 7–232)
Troponin I: 0.63 ng/mL (ref 0.00–0.06)
Troponin I: 2.15 ng/mL (ref 0.00–0.06)
Troponin I: 82.12 ng/mL (ref 0.00–0.06)
Troponin I: 98.53 ng/mL (ref 0.00–0.06)

## 2010-09-23 LAB — URINALYSIS, ROUTINE W REFLEX MICROSCOPIC
Glucose, UA: NEGATIVE mg/dL
Ketones, ur: NEGATIVE mg/dL
Leukocytes, UA: NEGATIVE
Nitrite: NEGATIVE
Protein, ur: 30 mg/dL — AB
Specific Gravity, Urine: >1.046 — ABNORMAL HIGH (ref 1.005–1.030)
Urobilinogen, UA: 0.2 mg/dL (ref 0.0–1.0)
pH: 5.5 (ref 5.0–8.0)

## 2010-09-23 LAB — POCT I-STAT 4, (NA,K, GLUC, HGB,HCT)
Glucose, Bld: 116 mg/dL — ABNORMAL HIGH (ref 70–99)
Glucose, Bld: 168 mg/dL — ABNORMAL HIGH (ref 70–99)
Glucose, Bld: 97 mg/dL (ref 70–99)
HCT: 31 % — ABNORMAL LOW (ref 39.0–52.0)
Hemoglobin: 8.2 g/dL — ABNORMAL LOW (ref 13.0–17.0)
Potassium: 2.9 mEq/L — ABNORMAL LOW (ref 3.5–5.1)
Potassium: 3 mEq/L — ABNORMAL LOW (ref 3.5–5.1)
Potassium: 3.2 mEq/L — ABNORMAL LOW (ref 3.5–5.1)
Potassium: 3.5 mEq/L (ref 3.5–5.1)
Potassium: 3.5 mEq/L (ref 3.5–5.1)
Potassium: 3.8 mEq/L (ref 3.5–5.1)
Sodium: 139 mEq/L (ref 135–145)
Sodium: 139 mEq/L (ref 135–145)
Sodium: 142 mEq/L (ref 135–145)
Sodium: 142 mEq/L (ref 135–145)
Sodium: 143 mEq/L (ref 135–145)
Sodium: 143 mEq/L (ref 135–145)

## 2010-09-23 LAB — MAGNESIUM
Magnesium: 2.2 mg/dL (ref 1.5–2.5)
Magnesium: 2.2 mg/dL (ref 1.5–2.5)

## 2010-09-23 LAB — APTT: aPTT: 55 seconds — ABNORMAL HIGH (ref 24–37)

## 2010-09-23 LAB — HEMOGLOBIN A1C: Mean Plasma Glucose: 117 mg/dL

## 2010-09-23 LAB — PREPARE RBC (CROSSMATCH)

## 2010-09-23 LAB — POCT I-STAT 3, VENOUS BLOOD GAS (G3P V)
pCO2, Ven: 38.3 mmHg — ABNORMAL LOW (ref 45.0–50.0)
pO2, Ven: 124 mmHg — ABNORMAL HIGH (ref 30.0–45.0)

## 2010-09-23 LAB — HEMOGLOBIN AND HEMATOCRIT, BLOOD
HCT: 19.8 % — ABNORMAL LOW (ref 39.0–52.0)
Hemoglobin: 6.8 g/dL — CL (ref 13.0–17.0)

## 2010-09-23 LAB — POCT I-STAT GLUCOSE
Glucose, Bld: 156 mg/dL — ABNORMAL HIGH (ref 70–99)
Operator id: 3406

## 2010-09-23 LAB — ABO/RH: ABO/RH(D): A POS

## 2010-09-23 LAB — PHOSPHORUS: Phosphorus: 3.6 mg/dL (ref 2.3–4.6)

## 2010-11-01 NOTE — Assessment & Plan Note (Signed)
OFFICE VISIT   [name] [name], [name] [name]  DOB:  Dec 31, 1940                                        August 12, 2009  CHART #:  [id number]   The patient returns to the office today in followup after replacement of  his ascending aorta, aortic valve, innominate artery, and arch aneurysm  on February 19, 2009.  He now notes that he is returning to activities  that prior to surgery was unable to do.  He has got several more weeks  left in cardiac rehab.  He is increasing his physical activity  appropriately.  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   On exam today, his blood pressure 136/66, pulse 60, respiratory rate is  18, and O2 sats 98%.  His sternum is stable and well healed.  Bowel  sounds are crisp without any murmur of aortic insufficiency.  Abdominal  exam is benign.  Lower extremities are without edema.  He has no  evidence of lymphocele in the right groin.  He has no pedal edema.   He continues on aspirin.  Last week, [name] stopped his Coumadin  as he has maintained sinus rhythm.  Continues on Lopressor, Norvasc,  Prevacid, Vasotec, Zocor, Lasix and potassium 3 days a week.   Followup chest x-ray shows clear lung fields bilaterally.   He notes that he is to have an echocardiogram in June of this year.  I  have made him return appointment to see me in September, which will be  about 1 year postop and at that time obtain a CTA of the chest to  evaluate any changes in the size of his descending aorta.   [name] Plane, MD  Electronically Signed   EG/MEDQ  D:  08/12/2009  T:  08/12/2009  Job:  [id number]   cc:   [name], MD

## 2010-11-01 NOTE — Assessment & Plan Note (Signed)
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  In addition, he  developed a right pleural effusion and underwent thoracentesis x1.  Unfortunately, at that time, the radiologist failed to completely drain  the right chest.  He returns today with a followup chest x-ray.  Overall, he continues to improve.  His physical ability, he is walking  more, has some cough, but no severe shortness of breath.  Overall, he  appears to be improving.   On exam, his blood pressure 130/56, pulse 65, respiratory rate is 18,  and O2 sats 97%.  His bowel sounds are crisp.  His sternal incision is  well healed.  He has decreased breath sounds at the right base, clear at  the left.   Chest x-ray shows slight increase in pleural effusion compared to the  film done 1 week ago.   PLAN:  We will hold the patient's Coumadin starting on Friday.   Ultrasound-guided thoracentesis of the right chest, drainage draw next  Tuesday.   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  We will continue to follow this clinically.  After this  thoracentesis, the patient will resume his Coumadin.  I will plan to see  him back in 2 weeks with a followup chest x-ray.   Sheliah Plane, MD  Electronically Signed   EG/MEDQ  D:  04/08/2009  T:  04/08/2009  Job:  [id number]   cc:   [name], MD

## 2010-11-01 NOTE — Assessment & Plan Note (Signed)
OFFICE VISIT   CHAE, OOMMEN  DOB:  1940/08/16                                        April 01, 2009  CHART #:  [id number]   The patient returns to the office in follow up after replacement of his  ascending aorta, aortic valve, aortic arch and innominate artery, when  he presented with a large aneurysm and cardiogenic shock based on severe  aortic insufficiency.  This was done on September 3.  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  Monday of this week, his  Coumadin had been held, a right thoracentesis ultrasound-guided was done  by Radiology and approximately 1100 mL was withdrawn.  In addition 20 mL  was withdrawn from the right groin lymphocele after obtaining an  ultrasound of the right groin.  The patient returns today for a follow  up visit with a chest x-ray.  He notes that since Monday he has been  able to sleep flat, his respiratory status has improved.   PHYSICAL EXAMINATION:  Vital Signs:  His blood pressure is 120/64, pulse  is 70, respiratory rate 16, O2 sats 97%.  Lungs:  Left lung is clear.  Aortic valve is crisp without aortic insufficiency, still some decrease  in breath sounds at the right base.  Chest:  The sternum is stable and  well-healed.  The lymphocele has reduced slightly but much smaller than  it was on Monday.   Followup chest x-ray was obtained and there is still some small to  moderate right pleural effusion but much better than Monday and at this  point, I do not think it warrants a repeat thoracentesis, but we will  see him back again in 1 week with a followup chest x-ray.   In addition the right groin was prepped and with numbing of the skin  with ethyl chloride, a small needle was introduced into the lymphocele  area for further evacuation.  Only about 5-6 mL was  withdrawn at this point, I will continue to monitor this.   I plan to see  the patient back in 1 week.  Today, he is going downstairs to Dr.  Hoyle Barr office for a repeat protime.   Sheliah Plane, MD  Electronically Signed   EG/MEDQ  D:  04/01/2009  T:  04/02/2009  Job:  [id number]   cc:   [name], MD

## 2010-11-01 NOTE — Assessment & Plan Note (Signed)
OFFICE VISIT   [name] [name], [name] [name]  DOB:  11/27/1940                                        April 22, 2009  CHART #:  [id number]   The patient 2 months ago underwent extensive replacement of the aortic  root, ascending aorta, and arch aorta for severe AI with a shock and  extensive ascending and arch aneurysm.  The patient has been followed  because of recurrent right pleural effusion and also a right groin  lymphocele.  He returns today with a followup chest x-ray.  Overall, he  is feeling better.  He notes that the home nurse had measured his blood  pressure as being low and discussed this with [name] and his  Norvasc had been decreased from 10 mg a day to 5 mg a day.  His blood  pressure is better with this.  However, he still notes especially when  standing suddenly or rushing, having some lightheadedness.  He is  scheduled for an echocardiogram next week with [name].   On exam today, his blood pressure 140/65, pulse 63, respiratory rate is  18, and O2 sat is 99%.  Overall, he looks better.  His lungs are clear.  Cardiac exam reveals regular rate and rhythm without murmur.  There is  no murmur of aortic insufficiency.  His pedal edema is resolved.  The  right groin lymphocele is continuing to decrease in size and is markedly  smaller than it had been and not fluctuant.   Followup chest x-ray shows clear lung fields without evidence of  reaccumulation of the right pleural effusion over the last 2 weeks.   I am somewhat concerned about his dizziness especially in light of  extensive nature of his surgery and being on Coumadin.  I have talked to  [name] office, although he is not there today.  They will  proceed to do an echocardiogram on him today just to rule out any  possibility of pericardial effusion causing these symptoms.  If this is  negative, then we will have [name] consider a Holter monitor.  The  patient has been on  Lasix 40 mg twice a day, and I  have asked him to cut this back to once a day.  I will plan to see him  back in 2 weeks, and he plans to see [name] next week.   Sheliah Plane, MD  Electronically Signed   EG/MEDQ  D:  04/22/2009  T:  04/23/2009  Job:  [id number]   cc:   [name], MD

## 2010-11-01 NOTE — Consult Note (Signed)
[name], [name] NO.:  [id number]   MEDICAL RECORD NO.:  [id number]          PATIENT TYPE:  INP   LOCATION:  2904                         FACILITY:  [facility]   PHYSICIAN:  [name], MDDATE OF BIRTH:  28-Apr-1941   DATE OF CONSULTATION:  DATE OF DISCHARGE:                                 CONSULTATION   REQUESTING PHYSICIAN:  [name], MD   REASON FOR CONSULTATION:  Dilated ascending aorta and aortic  insufficiency.   HISTORY OF PRESENT ILLNESS:  The patient is a 70 year old male who  several days ago told his daughter that he did not feel well.  The  following morning, he woke up and was without any symptoms.  The exact  complaint was very nondescript.  The patient otherwise was doing well  without any evidence of congestive heart failure, chest pain, or other  symptoms.  This morning, he told his wife he was having pain in the left  upper quadrant, became diaphoretic and short of breath, went to his  primary care doctor who sent him to the [facility] Emergency Room.  He  was seen by [name] and concerned for acute myocardial infarction.  He was treated as a code STEMI, went straight to the Cath Lab.  At the  time of catheterization, he somewhat to everyone surprise.  He was found  to have a dilated ascending aorta of over 6 cm without evidence of  dissection on root injection.  He had some aortic insufficiency, but  hard to grade.  The coronary anatomy is somewhat incomplete.  He appears  to have a short left main and patent LAD and circumflex and a small  nondominant right, but was never fully engaged.  With these findings, I  saw the patient with [name] and discussed the patient with Dr.  Eldridge Dace and it was decided to proceed with the CT scan to rule out  possibility of aortic dissection.  Prior to this, the patient became  increasingly short of breath and ultimately was intubated prior to his  CT.  The CT showed no evidence  of aortic dissection.   PAST MEDICAL HISTORY:  Positive for:  1. Hyperlipidemia.  2. His wife noted that he had had a torn esophagus in the past.      From her description, this most likely was related to Mallory-Weiss      tear with bleeding following nausea and vomiting after a knee      replacement.  He had surgery on both knee replacements bilaterally.   SOCIAL HISTORY:  The patient is a former smoker, married, recently moved  from [place] to [place].   FAMILY HISTORY:  Significant for his father who died of thoracic aortic  aneurysm the night prior to a surgery for a colonic diverticulum.   The patient's home medicines include Vasotec, Zocor, Celebrex, and  Prevacid.   REVIEW OF SYSTEMS:  Unobtainable from the patient directly and talking  to the patient's wife, he has been on Coumadin in the past without any  difficulty.  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  He has had no  complaint of blood in his stool or urine.   PHYSICAL EXAMINATION:  VITAL SIGNS:  The patient's blood pressure is  110/70 to 140/48, heart rate 58, in sinus, and O2 sats 100%, sedated on  ventilator.  NECK:  I do not appreciate any carotid bruits.  CARDIAC:  Regular rate and rhythm.  There is no active wheezing.  I do  not appreciate a murmur of aortic insufficiency, but this could be  easily missed with the noise of ventilation.  ABDOMEN:  Not distended or rigid.  He has palpable femoral pulses.  Cyst  in the right groin.  He has palpable distal pulses.   I have reviewed the patient's CT scan, cardiac catheterization,  echocardiogram and personally reviewed the TEE while being done by Dr.  Eldridge Dace.  It appears on the TEE again he has no evidence of aortic  dissection, but does have a trileaflet valve with probably more aortic  insufficiency than appreciated to cath or transthoracic echo.  Without  any other good explanation, it is presumable, but with dilated aorta the  patient has had  decompensation because of increasing aortic  insufficiency.  There is no evidence of endocarditis.  There is no  evidence of flail aortic leaflets.  With the patient's current  situation, he appears hemodynamically stable at this point.  We will  diurese some and then proceed with ascending aortic root replacement,  probably with a tissue prosthesis possibly with hypothermic circulatory  arrest to do the distal anastomosis.  The risks and diagnosis has been  discussed with the patient's wife in detail.  She is aware of the risk  of death, infection, stroke, myocardial infarction, bleeding, and blood  transfusion.      Sheliah Plane, MD  Electronically Signed      [name], MD  Electronically Signed    EG/MEDQ  D:  02/18/2009  T:  02/19/2009  Job:  [id number]   cc:   [name], MD

## 2010-11-01 NOTE — Assessment & Plan Note (Signed)
OFFICE VISIT   REI, MEDLEN  DOB:  09/30/1940                                        March 25, 2009  CHART #:  [id number]   The patient returns to the office today in followup after his recent  aortic valve replacement and replacement of aortic root and ascending  aorta with [employer] Valsalva graft and implantation of right and left  coronary ostium, replacement of aortic arch, and replacement of  innominate artery because of a large severe aortic insufficiency with  acute cardiac failure, pulmonary edema done urgently on February 19, 2009.  Overall, considering the patient's preoperative condition, he has  made reasonable progress postoperatively gaining weight, does have some  cough and had some pedal edema.  [name] increased his Lasix last  week and his family notes that he diuresed significant amount of fluid.  He has been on Coumadin for postoperative atrial fibrillation.   On exam, his blood pressure 127/66, pulse 69 and regular, respiratory  rate is 18, O2 sat is 97%.  He has marked decrease in breath sounds over  the right chest.  I do not appreciate any murmur of aortic  insufficiency.  He has mild pedal edema bilaterally.  His sternum is  stable and well healed.  There is no bony instability.   Followup chest x-ray shows moderate to large right pleural effusion.  The patient's INR for Coumadin is pending today.   His current medications have been modified, continues on:  1. Aspirin 81 mg a day.  2. Coumadin is monitored by [name] office.  3. Lopressor 25 b.i.d.  4. Norvasc 10 mg a day.  5. Zocor 20 mg a day.  6. Amiodarone 200 mg a day.  7. Lasix 40 mg twice a day.  8. Potassium 20 mEq once a day.   Also on physical examination, the patient has a firm mass in the right  groin which was the site of aortic cannulation.  The patient is  suspicious for a lymphocele but could be a false aneurysm from the  cannulation site  today.  The patient notes that it has been present for  about 2 weeks but has not changed, has nonenlarged, and is not painful.   Plan today to stop the patient's Coumadin, temporarily check his protime  as originally scheduled today, obtain ultrasound of the right groin and  femoral artery, and hold his Coumadin until early next week and  dependent on his protime, then we will proceed with ultrasound-guided  thoracentesis of the right pleural effusion.  I will plan to see him  back in 1 week with a followup chest x-ray.   Sheliah Plane, MD  Electronically Signed   EG/MEDQ  D:  03/25/2009  T:  03/26/2009  Job:  [id number]   cc:   [name], MD

## 2010-11-01 NOTE — Assessment & Plan Note (Signed)
OFFICE VISIT   KDEN, WAGSTER  DOB:  1940/11/25                                        May 06, 2009  CHART #:  [id number]   The patient returns to the office today in follow up after his aortic  valve replacement, replacement of aortic root, ascending aorta, and  arch, and replacement a right innominate artery on February 19, 2009.  Postoperative, he had trouble with a right groin lymphocele and also  right pleural effusion.  The pleural effusion was tapped several times  and now has resolved.  Overall, he seems to be making good progress, but  now I encouraged him to enroll in cardiac rehab.  His peripheral edema  has markedly improved and overall fatigue is improved.   On exam today, his blood pressure 134/63, pulse is 59, respiratory rate  is 18, O2 sat is 98%.  Lungs are clear bilaterally.  I do not appreciate  any murmur of aortic insufficiency.  We knew by echo 2 weeks ago that he  had at least moderate MR though I do not appreciate much of a murmur  associated with this.  He has minimal pedal edema.  The right groin  lymphocele is resolved.   Followup chest x-ray shows very small right pleural effusion.  Otherwise, clear lung fields.   [name] has stopped the amiodarone.  The patient thinks that he  feels better since this, was stopped.  Otherwise, he continues on 81 mg  aspirin, Coumadin as monitored by [name] office, Lopressor 25  b.i.d., Norvasc 5 mg a day, Prevacid, Vasotec 5 mg a day, Zocor 20 mg a  day, Lasix 40 mg once a day, potassium 20 mEq once a day.   Overall, I am very pleased with his current progress.  I do plan to see  him back in 3 months or sooner as necessary.   Sheliah Plane, MD  Electronically Signed   EG/MEDQ  D:  05/06/2009  T:  05/07/2009  Job:  [id number]   cc:   [name], MD

## 2010-11-01 NOTE — Cardiovascular Report (Signed)
NAMELENIN, KUHNLE             ACCOUNT NO.:  [id number]   MEDICAL RECORD NO.:  [id number]          PATIENT TYPE:  INP   LOCATION:  2303                         FACILITY:  [facility]   PHYSICIAN:  [name], MDDATE OF BIRTH:  08-Feb-1941   DATE OF PROCEDURE:  02/18/2009  DATE OF DISCHARGE:                            CARDIAC CATHETERIZATION   PROCEDURES PERFORMED:  Left heart catheterization, coronary angiogram,  left ventriculogram, abdominal aortogram.   OPERATOR:  [name], MD   INDICATIONS:  Question ST-elevation MI.   PROCEDURE NARRATIVE:  The patient was brought emergently to the cardiac  cath lab.  He was prepped and draped in the usual sterile fashion.  His  right groin was infiltrated with 1% lidocaine.  A 6-French sheath was  placed into the right femoral artery using a modified Seldinger  technique.  Left coronary artery angiography was attempted with a JL-4,  JL-5, JL-6 catheter, all of which were unsuccessful.  A CLS 4.5 guiding  catheter was used, which could not reach the ostium on the left main.  Subsequently, an AL-3 catheter was used and was able to reach the ostium  of the LAD.  A JR-4 catheter was attempted to engage the right coronary  artery was unsuccessful.  We also tried with an AL-2 an AL-3 without  success.  The circumflex could also not be engaged as there was no  common left main.  The circumflex had a separate ostium and was only non  selectively engaged, only the LAD was selectively engaged.  Digital  angiography was performed and multiple projections using hand injection  of contrast.  Pigtail catheter was then advanced to the ascending aorta  and across the aortic valve.  Power injection of contrast was performed  in the RAO projection to image left ventricle.  The catheter was pulled  back under continuous hemodynamic pressure monitoring.  Catheter was  then placed in the ascending aorta and a power injection of contrast was  performed to image the ascending aorta.  Multiple attempts were made  engaging the coronary arteries selectively with various catheters.  Subsequently, the procedure was stopped and the sheath was removed using  manual compression.   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  The left ventriculogram showed global left ventricular hypokinesis with  an estimated ejection fraction of 40%.  The aortic root showed a large ascending aortic aneurysm.  The RCA does  still and appears small.  The left anterior descending is a large vessel with moderate disease in  the midportion of the vessel.  There is also a proximal 50% lesion at  the apical vessel.  The first diagonal of an ostial 50% lesion.  Second  diagonal is small and widely patent.  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   The left circumflex appeared to be a large vessel.  There did not appear  to be any acute occlusion appreciated.  There appeared to be moderate  disease proximally.  There was a large OM1 and what appeared either be  an OM-2 or left PDA,  which appeared patent.   The right coronary artery was not seen well with the coronary catheters.   HEMODYNAMIC RESULTS:  Left ventricular pressure 134/2 with an LVEDP of  15 mmHg.  Aortic pressure 143/53 with a mean aortic pressure of 91 mmHg.   IMPRESSION:  1. Large ascending aortic aneurysm, a question is whether this is      acute or chronic.  Currently, the patient is doing well.  He is not      having any chest pain.  2. No aortic valve gradient.  3. No obvious dissection flap noted.  4. No significant coronary artery disease documented.  5. Global left ventricular hypokinesis.   RECOMMENDATIONS:  We will check a stat CT scan to evaluate whether this  is an aortic dissection or if this is just a large aneurysm and case was  discussed with Dr. Tyrone Sage.  We will also start labetalol drip IV to  help with blood pressure control.  We  will not give any anticoagulation  at this time. We will also consider performing transesophageal  echocardiogram to further evaluate his aortic valve and possibly for an  aortic dissection.      [name], MD  Electronically Signed     JSV/MEDQ  D:  02/20/2009  T:  02/21/2009  Job:  [id number]

## 2010-11-01 NOTE — Assessment & Plan Note (Signed)
OFFICE VISIT   [name] [name], [name] [name]  DOB:  1940/08/29                                        March 31, 2010  CHART #:  [id number]   HISTORY:  The patient returns to the office with a followup CT scan  approximately 1 year ago.  He underwent aortic valve replacement with  pericardial tissue valve, replacement of aortic root and ascending aorta  with a [employer] Valsalva graft, reimplantation of the coronary  arteries, replacement of the aortic arch to the left subclavian, and  replacement of the right innominate artery with a 14-mm graft from the  ascending aorta.  At that time, he was near vascular collapse with  severe aortic insufficiency of on a ventilator.  Considering his stay  last year, he continues to do reasonably well, though the past summer  especially with the heat he noted increasing shortness of breath and saw  [name] and evidently had an elevated BNP and his Lasix dose was  increased.  His symptoms of heart failure have improved with this.  He  notes that the Lasix did improve his shortness of breath.  He notes that  during the summary had an echocardiogram in [name] office.   PAST MEDICAL HISTORY:  Reviewed.   MEDICATIONS:  1. He is currently on aspirin 81 mg a day.  2. Lopressor 25 a day b.i.d.  3. Norvasc 5 mg a day.  4. Prevacid 30 mg a day.  5. Vasotec 5 mg a day.  6. Zocor 20 mg a day.  7. Lasix 40 mg twice a day.  8. Potassium 20 mEq a day.  9. Loratadine.   ALLERGIES:  He has no known allergies.   PHYSICAL EXAMINATION:  His blood pressure is 164/71, pulse 71,  respiratory rate is 18, O2 sats 98%.  His sternum is stable and well  healed.  His lungs are clear.  He has some slightly more truncal obesity  than he had on his last visit.  He has no pedal edema.  Pulses are equal  in the right and left arm.   IMAGING:  Follow up CT scan was done today that shows the previous  surgical replacement of his graft,  ascending aorta valve and arch  without any technical difficulties noted.  His proximal descending aorta  is unchanged at approximately 4 cm.   ASSESSMENT AND PLAN:  At this point, there is no evidence of dissection  or technical difficulties with his previous aortic repair.  I made him  an appointment to see me back in 1 year with a followup CT scan to check  the status of his descending aorta.  He notes that on exam today I do  not appreciate any murmur of mitral insufficiency.  However, he has been  followed by [name] with serial echocardiograms for some degree of  mitral insufficiency.  At this point, it is not clear how severe this  is.  He did respond to rather small changes in treatment for heart  failure.  He notes that he is to see [name] in the near future and  have a repeat echo and we will ask him to send a copy of the echo report  at that time.   Sheliah Plane, MD  Electronically Signed   EG/MEDQ  D:  03/31/2010  T:  04/01/2010  Job:  [id number]   cc:   [name], MD

## 2011-03-13 ENCOUNTER — Other Ambulatory Visit: Payer: Self-pay | Admitting: Cardiothoracic Surgery

## 2011-03-13 DIAGNOSIS — I712 Thoracic aortic aneurysm, without rupture: Secondary | ICD-10-CM

## 2011-03-30 ENCOUNTER — Other Ambulatory Visit: Payer: Medicare Other

## 2011-03-30 ENCOUNTER — Ambulatory Visit: Payer: Medicare Other | Admitting: Cardiothoracic Surgery

## 2011-05-15 ENCOUNTER — Encounter: Payer: Self-pay | Admitting: Cardiothoracic Surgery

## 2011-05-15 DIAGNOSIS — I10 Essential (primary) hypertension: Secondary | ICD-10-CM | POA: Insufficient documentation

## 2011-05-15 DIAGNOSIS — I719 Aortic aneurysm of unspecified site, without rupture: Secondary | ICD-10-CM | POA: Insufficient documentation

## 2011-05-18 ENCOUNTER — Encounter: Payer: Self-pay | Admitting: Cardiothoracic Surgery

## 2011-05-18 ENCOUNTER — Ambulatory Visit
Admission: RE | Admit: 2011-05-18 | Discharge: 2011-05-18 | Disposition: A | Discharge status: Home / Self Care | Payer: Medicare Other | Source: Ambulatory Visit | Attending: Cardiothoracic Surgery | Admitting: Cardiothoracic Surgery

## 2011-05-18 ENCOUNTER — Ambulatory Visit (INDEPENDENT_AMBULATORY_CARE_PROVIDER_SITE_OTHER): Payer: Medicare Other | Admitting: Cardiothoracic Surgery

## 2011-05-18 VITALS — BP 136/63 | HR 63 | Resp 18 | Ht 72.0 in | Wt 167.0 lb

## 2011-05-18 DIAGNOSIS — Z952 Presence of prosthetic heart valve: Secondary | ICD-10-CM

## 2011-05-18 DIAGNOSIS — I719 Aortic aneurysm of unspecified site, without rupture: Secondary | ICD-10-CM

## 2011-05-18 DIAGNOSIS — Z954 Presence of other heart-valve replacement: Secondary | ICD-10-CM

## 2011-05-18 DIAGNOSIS — I712 Thoracic aortic aneurysm, without rupture: Secondary | ICD-10-CM

## 2011-05-18 MED ORDER — IOHEXOL 300 MG/ML  SOLN
100.0000 mL | Freq: Once | INTRAMUSCULAR | Status: AC | PRN
  Administered 2011-05-18: 100 mL via INTRAVENOUS

## 2011-05-18 NOTE — Progress Notes (Signed)
Patient ID: [name] [name], male   DOB: 1940/07/22, 70 y.o.   MRN: [id number]                    [address]            [place] [address]          [phone]       [name] [place] Medical Record #[id number] Date of Birth: 03-Feb-1941  Referring: [name]., * Primary Care: Gabriel Cirri, DO  Chief Complaint:    Chief Complaint  Patient presents with  . Follow-up    1 year f/u wtih CTA Chest, surveillance of descending aorta    History of Present Illness:        Current Activity/ Functional Status: Mobility/Ambulation: Independent with mobility prior to admission in the home on all surfaces with no assistive devices for unlimited distances.    Past Medical History  Diagnosis Date  . Hyperlipidemia   . DVT (deep venous thrombosis)     post knee surgery  . Tobacco abuse   . HTN (hypertension)   . Descending aortic aneurysm   . Aortic valve insufficiency     Past Surgical History  Procedure Date  . Right groin lymphocele [id number]  . Aortic valve replacement [id number]    History  Smoking status  . Former Smoker  . Types: Cigarettes  . Quit date: 04/16/1974  Smokeless tobacco  . Never Used   History  Alcohol Use No    History   Social History  . Marital Status: Married    Spouse Name: N/A    Number of Children: N/A  . Years of Education: N/A   Occupational History  . Not on file.   Social History Main Topics  . Smoking status: Former Smoker    Types: Cigarettes    Quit date: 04/16/1974  . Smokeless tobacco: Never Used  . Alcohol Use: No  . Drug Use: No  . Sexually Active: Not on file   Other Topics Concern  . Not on file   Social History Narrative  . No narrative on file    Allergies  Allergen Reactions  . Morphine And Related Hives    Current Outpatient Prescriptions  Medication Sig Dispense Refill  . amLODipine (NORVASC) 5 MG tablet Take 5 mg by mouth daily.        Marland Kitchen aspirin EC 81 MG  tablet Take 81 mg by mouth daily.        . clopidogrel (PLAVIX) 75 MG tablet Take 75 mg by mouth daily.        . enalapril (VASOTEC) 5 MG tablet Take 5 mg by mouth daily.        . furosemide (LASIX) 40 MG tablet Take 40 mg by mouth 2 (two) times daily.        . lansoprazole (PREVACID) 30 MG capsule Take 30 mg by mouth daily.        Marland Kitchen loratadine (CLARITIN) 10 MG tablet Take 10 mg by mouth daily.        . metoprolol (TOPROL-XL) 50 MG 24 hr tablet Take 50 mg by mouth 2 (two) times daily.        . nitroGLYCERIN (NITROSTAT) 0.4 MG SL tablet Place 0.4 mg under the tongue every 5 (five) minutes as needed.        . potassium chloride SA (K-DUR,KLOR-CON) 20 MEQ tablet Take 20 mEq by mouth daily.       Marland Kitchen  simvastatin (ZOCOR) 20 MG tablet Take 20 mg by mouth at bedtime.         No current facility-administered medications for this visit.   Facility-Administered Medications Ordered in Other Visits  Medication Dose Route Frequency Provider Last Rate Last Dose  . iohexol (OMNIPAQUE) 300 MG/ML injection 100 mL  100 mL Intravenous Once PRN Medication Radiologist   100 mL at 05/18/11 1152     (Not in a hospital admission)  No family history on file.   Review of Systems:    Review of Systems  Constitutional: Positive for fever and malaise/fatigue. Negative for chills and weight loss.  HENT: Positive for nosebleeds.   Eyes: Negative.   Respiratory: Negative for hemoptysis, sputum production and wheezing.   Cardiovascular: Positive for palpitations and leg swelling. Negative for chest pain, orthopnea, claudication and PND.  Gastrointestinal: Negative.   Genitourinary: Negative.   Musculoskeletal: Positive for myalgias and joint pain.  Skin: Negative.   Neurological: Positive for dizziness and weakness. Negative for tingling, tremors, sensory change, speech change, focal weakness, seizures and loss of consciousness.  Endo/Heme/Allergies: Negative.   Psychiatric/Behavioral: Negative.     Physical  Exam: BP 136/63  Pulse 63  Resp 18  Ht 6' (1.829 m)  Wt 167 lb (75.751 kg)  BMI 22.65 kg/m2  SpO2 98%  General appearance: alert, cooperative, appears older than stated age, fatigued, no distress and slowed mentation Neurologic: intact Heart: regular rate and rhythm, S1, S2 normal, no murmur, click, rub or gallop, normal apical impulse, no click and no rub Lungs: clear to auscultation bilaterally Abdomen: soft, non-tender; bowel sounds normal; no masses,  no organomegaly Extremities: extremities normal, atraumatic, no cyanosis or edema, no edema, redness or tenderness in the calves or thighs and no ulcers, gangrene or trophic changes Wound: sternum stable   Diagnostic Studies & Laboratory data:     Recent Radiology Findings:   Ct Angio Chest W/cm &/or Wo Cm  05/18/2011  *RADIOLOGY REPORT*  Clinical Data:  Known thoracic aortic aneurysm; AVR, 15 pounds weight loss  CT ANGIOGRAPHY CHEST WITH CONTRAST  Technique:  Multidetector CT imaging of the chest was performed using the standard protocol during bolus administration of intravenous contrast.  Multiplanar CT image reconstructions including MIPs were obtained to evaluate the vascular anatomy.  Contrast: OMNIPAQUE IOHEXOL 300 MG/ML IV SOLN  Comparison:  Chest CTA - 07/28/2010; 03/31/2010  Findings:  Stable sequela of aortic valve replacement and repair of the ascending thoracic aorta.  The ascending thoracic aorta is unchanged in size, measuring approximately 3.4 cm as measured at the level of the main pulmonary artery.  There is fusiform dilatation of the proximal aspect of the descending thoracic aorta, measuring approximately 4.3 cm (coronal image 71, series 601), unchanged since the 03/2010 examination.  The branch vessels of the aortic arch are widely patent throughout their imaged course. The amount of noncalcified intramural thrombus along the mid aspect of the descending thoracic aorta is unchanged compared to most recent chest CTA  performed 07/28/2010.  There is no evidence of acute thoracic aortic dissection.  Although this examination was not tailored for the evaluation of the pulmonary arteries, there is no discrete filling defect in the main, right or left pulmonary artery.  Review of the MIP images confirms the above findings.  The central airways are patent. Unchanged appearance of 8 mm noncalcified nodule in the right upper lobe (image 31, series five).  No focal airspace opacity.  No pleural effusion or pneumothorax. Scattered shoddy  mediastinal lymph nodes are not enlarged by CT criteria.  No mediastinal, hilar or axillary lymph adenopathy.  The heart is enlarged, in particular, the left ventricle.  No pericardial effusion.  Coronary artery calcifications.  No pericardial effusion.  Early arterial evaluation of the upper abdomen demonstrates unchanged appearance of partially exophytic cyst arising from the superior pole right kidney.  Several smaller stones are identified within an otherwise normal-appearing gallbladder.  IMPRESSION: 1.  Post aortic valve replacement and ascending aortic repair without evidence of complication. 2.  Fusiform dilatation of the proximal aspect of the descending thoracic aorta, measuring approximately 4.3 cm in diameter, unchanged since the 03/2010 examination.  3.  Unchanged indeterminate 8 mm nodule within the right upper lobe, stable since the 03/2010 examination.   Continued attention on follow-up to ensure 2 years of stability is recommended.  Original Report Authenticated By: Waynard Reeds, M.D.      Recent Lab Findings: Lab Results  Component Value Date   WBC 7.7 07/30/2010   HGB 10.5* 07/30/2010   HCT 32.6* 07/30/2010   PLT 205 07/30/2010   GLUCOSE 111* 07/30/2010   CHOL  Value: 136        ATP III CLASSIFICATION:  <200     mg/dL   Desirable  161-096  mg/dL   Borderline High  >=045    mg/dL   High        09/25/8117   TRIG 89 07/29/2010   HDL 38* 07/29/2010   LDLCALC  Value: 80         Total Cholesterol/HDL:CHD Risk Coronary Heart Disease Risk Table                     Men   Women  1/2 Average Risk   3.4   3.3  Average Risk       5.0   4.4  2 X Average Risk   9.6   7.1  3 X Average Risk  23.4   11.0        Use the calculated Patient Ratio above and the CHD Risk Table to determine the patient's CHD Risk.        ATP III CLASSIFICATION (LDL):  <100     mg/dL   Optimal  147-829  mg/dL   Near or Above                    Optimal  130-159  mg/dL   Borderline  562-130  mg/dL   High  >865     mg/dL   Very High 7/84/6962   ALT 14 07/28/2010   AST 23 07/28/2010   NA 142 07/30/2010   K 3.8 07/30/2010   CL 106 07/30/2010   CREATININE 0.80 07/30/2010   BUN 16 07/30/2010   CO2 28 07/30/2010   TSH 0.626 07/29/2010   INR 1.03 07/28/2010   HGBA1C  Value: 5.9 (NOTE)                                                                       According to the ADA Clinical Practice Recommendations for 2011, when HbA1c is used as a screening test:   >=6.5%   Diagnostic of Diabetes Mellitus           (  if abnormal result  is confirmed)  5.7-6.4%   Increased risk of developing Diabetes Mellitus  References:Diagnosis and Classification of Diabetes Mellitus,Diabetes Care,2011,34(Suppl 1):S62-S69 and Standards of Medical Care in         Diabetes - 2011,Diabetes Care,2011,34  (Suppl 1):S11-S61.* 07/29/2010      Assessment / Plan:      The patient returns today with a followup CT scan of the chest as noted above. There is no significant change in the size of his descending aorta or in the questionable areas in the right upper lobe of lung since scan one year ago. Overall the patient seems to be doing reasonably well following extensive replacement of his ascending aorta aortic valve and arch. He did have a myocardial infarction in February of this year when a stent was placed. He's had no recurrent difficulties from this with the exception of nose bleed while on aspirin and Plavix. Plan to see him back in one year with a  followup CT scan both to evaluate his lungs and his descending thoracic aorta      Delight Ovens MD 05/18/2011 12:57 PM

## 2011-08-30 DIAGNOSIS — H40019 Open angle with borderline findings, low risk, unspecified eye: Secondary | ICD-10-CM | POA: Diagnosis not present

## 2011-11-10 DIAGNOSIS — E782 Mixed hyperlipidemia: Secondary | ICD-10-CM | POA: Diagnosis not present

## 2011-11-10 DIAGNOSIS — R0602 Shortness of breath: Secondary | ICD-10-CM | POA: Diagnosis not present

## 2011-11-10 DIAGNOSIS — I2510 Atherosclerotic heart disease of native coronary artery without angina pectoris: Secondary | ICD-10-CM | POA: Diagnosis not present

## 2012-02-20 DIAGNOSIS — Z125 Encounter for screening for malignant neoplasm of prostate: Secondary | ICD-10-CM | POA: Diagnosis not present

## 2012-02-20 DIAGNOSIS — K219 Gastro-esophageal reflux disease without esophagitis: Secondary | ICD-10-CM | POA: Diagnosis not present

## 2012-02-20 DIAGNOSIS — I10 Essential (primary) hypertension: Secondary | ICD-10-CM | POA: Diagnosis not present

## 2012-02-20 DIAGNOSIS — E785 Hyperlipidemia, unspecified: Secondary | ICD-10-CM | POA: Diagnosis not present

## 2012-04-01 DIAGNOSIS — Z23 Encounter for immunization: Secondary | ICD-10-CM | POA: Diagnosis not present

## 2012-04-03 ENCOUNTER — Other Ambulatory Visit: Payer: Self-pay | Admitting: Cardiothoracic Surgery

## 2012-04-04 ENCOUNTER — Other Ambulatory Visit: Payer: Self-pay | Admitting: Cardiothoracic Surgery

## 2012-04-04 DIAGNOSIS — I719 Aortic aneurysm of unspecified site, without rupture: Secondary | ICD-10-CM

## 2012-05-07 ENCOUNTER — Other Ambulatory Visit: Payer: Self-pay | Admitting: Cardiothoracic Surgery

## 2012-05-07 DIAGNOSIS — I2510 Atherosclerotic heart disease of native coronary artery without angina pectoris: Secondary | ICD-10-CM | POA: Diagnosis not present

## 2012-05-07 DIAGNOSIS — I719 Aortic aneurysm of unspecified site, without rupture: Secondary | ICD-10-CM

## 2012-05-07 DIAGNOSIS — Z954 Presence of other heart-valve replacement: Secondary | ICD-10-CM | POA: Diagnosis not present

## 2012-05-30 ENCOUNTER — Ambulatory Visit
Admission: RE | Admit: 2012-05-30 | Discharge: 2012-05-30 | Disposition: A | Discharge status: Home / Self Care | Payer: Medicare Other | Source: Ambulatory Visit | Attending: Cardiothoracic Surgery | Admitting: Cardiothoracic Surgery

## 2012-05-30 ENCOUNTER — Ambulatory Visit (INDEPENDENT_AMBULATORY_CARE_PROVIDER_SITE_OTHER): Payer: Medicare Other | Admitting: Cardiothoracic Surgery

## 2012-05-30 ENCOUNTER — Encounter: Payer: Self-pay | Admitting: Cardiothoracic Surgery

## 2012-05-30 VITALS — BP 147/63 | HR 71 | Resp 18 | Ht 72.0 in | Wt 192.0 lb

## 2012-05-30 DIAGNOSIS — R911 Solitary pulmonary nodule: Secondary | ICD-10-CM

## 2012-05-30 DIAGNOSIS — I712 Thoracic aortic aneurysm, without rupture: Secondary | ICD-10-CM

## 2012-05-30 DIAGNOSIS — Z48812 Encounter for surgical aftercare following surgery on the circulatory system: Secondary | ICD-10-CM | POA: Diagnosis not present

## 2012-05-30 DIAGNOSIS — I719 Aortic aneurysm of unspecified site, without rupture: Secondary | ICD-10-CM

## 2012-05-30 MED ORDER — IOHEXOL 350 MG/ML SOLN
80.0000 mL | Freq: Once | INTRAVENOUS | Status: AC | PRN
  Administered 2012-05-30: 80 mL via INTRAVENOUS

## 2012-05-30 NOTE — Patient Instructions (Addendum)
No changes   Endocarditis Information  You may be at risk for developing endocarditis since you have  an artificial heart valve  or a repaired heart valve. Endocarditis is an infection of the lining of the heart or heart valves.   Certain surgical and dental procedures may put you at risk,  such as teeth cleaning or other dental procedures or any surgery involving the respiratory, urinary, gastrointestinal tract, gallbladder or prostate.   Notify your doctor or dentist before having any invasive procedures. You will need to take antibiotics before certain procedures.   To prevent endocarditis, maintain good oral health. Seek prompt medical attention for any mouth/gum, skin or urinary tract infections.

## 2012-05-30 NOTE — Progress Notes (Signed)
Patient ID: [name] [name], male   DOB: 1941-03-05, 71 y.o.   MRN: [id number]                    [address]            [place] [address]          [phone]       [name] Medical Record #[id number] Date of Birth: 1941/02/28  Referring: [name]., * Primary Care: Gabriel Cirri, DO  Chief Complaint:    Chief Complaint  Patient presents with  . Thoracic Aortic Aneurysm    1 year f/u with CTA Chest      History of Present Illness:     Patient is a 71 year old male who on 02/19/2009 presented with a large ascending aneurysm and acute aortic insufficiency and aortic arch aneurysm and at that time underwent aortic valve replacement replacement of aortic root and ascending aorta and replacement of aortic arch to the left subclavian artery. Since that time he's had an acute myocardial infarction and had a stent placed. He currently is doing reasonably well has some fatigue no chest pain no significant symptoms of heart failure. He comes in today one year from his previous visit for a CT scan of the chest to evaluate his remaining aorta. He continues to be followed in cardiology with serial echocardiograms. Since last seen he had some bruising he remains on Plavix and aspirin the aspirin dose was decreased 81 mg a day by cardiology.  Current Activity/ Functional Status: Mobility/Ambulation: Independent with mobility prior to admission in the home on all surfaces with no assistive devices for unlimited distances.    Past Medical History  Diagnosis Date  . Hyperlipidemia   . DVT (deep venous thrombosis)     post knee surgery  . Tobacco abuse   . HTN (hypertension)   . Descending aortic aneurysm   . Aortic valve insufficiency     Past Surgical History  Procedure Date  . Right groin lymphocele [id number]  . Aortic valve replacement [id number]    History  Smoking status  . Former Smoker  . Types: Cigarettes  . Quit date:  04/16/1974  Smokeless tobacco  . Never Used   History  Alcohol Use No    History   Social History  . Marital Status: Married    Spouse Name: N/A    Number of Children: N/A  . Years of Education: N/A   Occupational History  . Not on file.   Social History Main Topics  . Smoking status: Former Smoker    Types: Cigarettes    Quit date: 04/16/1974  . Smokeless tobacco: Never Used  . Alcohol Use: No  . Drug Use: No  . Sexually Active: Not on file   Other Topics Concern  . Not on file   Social History Narrative  . No narrative on file    Allergies  Allergen Reactions  . Lortab (Hydrocodone-Acetaminophen) Hives  . Morphine And Related Hives    Current Outpatient Prescriptions  Medication Sig Dispense Refill  . amLODipine (NORVASC) 5 MG tablet Take 5 mg by mouth daily.        Marland Kitchen aspirin EC 81 MG tablet Take 81 mg by mouth daily.        . clopidogrel (PLAVIX) 75 MG tablet Take 75 mg by mouth daily.        . enalapril (VASOTEC) 5 MG tablet Take 5 mg by mouth  daily.        . furosemide (LASIX) 40 MG tablet Take 40 mg by mouth 2 (two) times daily. PRN      . lansoprazole (PREVACID) 30 MG capsule Take 30 mg by mouth daily.        Marland Kitchen loratadine (CLARITIN) 10 MG tablet Take 10 mg by mouth daily.        . metoprolol (TOPROL-XL) 50 MG 24 hr tablet Take 50 mg by mouth 2 (two) times daily.        . nitroGLYCERIN (NITROSTAT) 0.4 MG SL tablet Place 0.4 mg under the tongue every 5 (five) minutes as needed.        . potassium chloride SA (K-DUR,KLOR-CON) 20 MEQ tablet Take 20 mEq by mouth daily.       . simvastatin (ZOCOR) 20 MG tablet Take 20 mg by mouth at bedtime.         No current facility-administered medications for this visit.   Facility-Administered Medications Ordered in Other Visits  Medication Dose Route Frequency Provider Last Rate Last Dose  . [COMPLETED] iohexol (OMNIPAQUE) 350 MG/ML injection 80 mL  80 mL Intravenous Once PRN Medication Radiologist, MD   80 mL at  05/30/12 1210       Review of Systems:    Review of Systems  Constitutional: Positive for fever and malaise/fatigue. Negative for chills and weight loss.  HENT: Positive for nosebleeds.   Eyes: Negative.   Respiratory: Negative for hemoptysis, sputum production and wheezing.   Cardiovascular: Positive for palpitations and leg swelling. Negative for chest pain, orthopnea, claudication and PND.  Gastrointestinal: Negative.   Genitourinary: Negative.   Musculoskeletal: Positive for myalgias and joint pain.  Skin: Negative.   Neurological: Positive for dizziness and weakness. Negative for tingling, tremors, sensory change, speech change, focal weakness, seizures and loss of consciousness.  Endo/Heme/Allergies: Negative.   Psychiatric/Behavioral: Negative.     Physical Exam: BP 147/63  Pulse 71  Resp 18  Ht 6' (1.829 m)  Wt 192 lb (87.091 kg)  BMI 26.04 kg/m2  SpO2 98%  General appearance: alert, cooperative, appears older than stated age, fatigued, no distress and slowed mentation Neurologic: intact Heart: regular rate and rhythm, S1, S2 normal, no murmur, click, rub or gallop, normal apical impulse, no click and no rub Lungs: clear to auscultation bilaterally Abdomen: soft, non-tender; bowel sounds normal; no masses,  no organomegaly Extremities: extremities normal, atraumatic, no cyanosis or edema, no edema, redness or tenderness in the calves or thighs and no ulcers, gangrene or trophic changes Wound: sternum stable   Diagnostic Studies & Laboratory data:     Recent Radiology Findings:  Ct Angio Chest W/cm &/or Wo Cm  05/30/2012  *RADIOLOGY REPORT*  Clinical Data: Follow up ascending thoracic aorta and aortic valve repair.  CT ANGIOGRAPHY CHEST  Technique:  Multidetector CT imaging of the chest using the standard protocol during bolus administration of intravenous contrast. Multiplanar reconstructed images including MIPs were obtained and reviewed to evaluate the vascular  anatomy.  Contrast: 80mL OMNIPAQUE IOHEXOL 350 MG/ML SOLN  Comparison: 05/18/2011  Findings: The configuration of the ascending thoracic aortic graft is unchanged.  Again noted is an aortic valve replacement.  The great vessels remain patent.  Calcifications involving the coronary arteries and evidence for a stent probably within the left circumflex coronary artery.  The proximal aspect of the descending thoracic aorta roughly measures 4.3 cm and unchanged.  The mid descending thoracic aorta roughly measures 4.1 cm and unchanged. There is  no evidence for a dissection.  No significant change in the size of the aorta near the aortic hiatus.  There is flow within the celiac trunk and proximal superior mesenteric artery.  The left gastric artery appears to have a separate origin from the aorta along the right side of the celiac trunk.  There is a stable round structure along the upper pole of right kidney that is slightly hyperdense and measures 3 cm. This structure did measure 2.4 cm on 02/18/2009.  There is a small hiatal hernia.  No gross abnormality to the adrenal tissue.  The trachea and mainstem bronchi are patent.  There is a stable 8 mm nodule in the anterior right upper lobe on sequence #5, image 19.  This is unchanged from exam on 04/18/2010 and was probably present on the exam on 02/18/2009.  The most likely represents a benign finding based on its stability.  No evidence for airspace disease.  No acute bony abnormality.  Median sternotomy wires are present.  Degenerative changes in lower cervical spine.  IMPRESSION: Stable appearance of the ascending thoracic aortic graft and aortic valve replacement.  No significant change in the size of the dilated descending thoracic aorta.  No evidence for dissection.  No acute abnormalities within the chest.  Stable 8 mm nodule in the right upper lung.  This has been stable for at least 2 years and likely a benign finding.  There is a stable round structure along the  upper pole of the right kidney.  The Hounsfield units are slightly elevated, measuring roughly 23.  This structure has enlarged since 2010.  This could represent a complex cyst.  This area could be evaluated with ultrasound to confirm that this is a renal cyst without suspicious characteristics.   Original Report Authenticated By: Richarda Overlie, M.D.       Recent Lab Findings: Lab Results  Component Value Date   WBC 7.7 07/30/2010   HGB 10.5* 07/30/2010   HCT 32.6* 07/30/2010   PLT 205 07/30/2010   GLUCOSE 111* 07/30/2010   CHOL  Value: 136        ATP III CLASSIFICATION:  <200     mg/dL   Desirable  161-096  mg/dL   Borderline High  >=045    mg/dL   High        09/25/8117   TRIG 89 07/29/2010   HDL 38* 07/29/2010   LDLCALC  Value: 80        Total Cholesterol/HDL:CHD Risk Coronary Heart Disease Risk Table                     Men   Women  1/2 Average Risk   3.4   3.3  Average Risk       5.0   4.4  2 X Average Risk   9.6   7.1  3 X Average Risk  23.4   11.0        Use the calculated Patient Ratio above and the CHD Risk Table to determine the patient's CHD Risk.        ATP III CLASSIFICATION (LDL):  <100     mg/dL   Optimal  147-829  mg/dL   Near or Above                    Optimal  130-159  mg/dL   Borderline  562-130  mg/dL   High  >865     mg/dL   Very High  07/29/2010   ALT 14 07/28/2010   AST 23 07/28/2010   NA 142 07/30/2010   K 3.8 07/30/2010   CL 106 07/30/2010   CREATININE 0.80 07/30/2010   BUN 16 07/30/2010   CO2 28 07/30/2010   TSH 0.626 07/29/2010   INR 1.03 07/28/2010   HGBA1C  Value: 5.9 (NOTE)                                                                       According to the ADA Clinical Practice Recommendations for 2011, when HbA1c is used as a screening test:   >=6.5%   Diagnostic of Diabetes Mellitus           (if abnormal result  is confirmed)  5.7-6.4%   Increased risk of developing Diabetes Mellitus  References:Diagnosis and Classification of Diabetes Mellitus,Diabetes Care,2011,34(Suppl  1):S62-S69 and Standards of Medical Care in         Diabetes - 2011,Diabetes Care,2011,34  (Suppl 1):S11-S61.* 07/29/2010      Assessment / Plan:      The patient returns today with a followup CT scan of the chest as noted above. There is no significant change in the size of his descending aorta or in the questionable areas in the right upper lobe of lung since scan one year ago. Overall the patient seems to be doing reasonably well following extensive replacement of his ascending aorta aortic valve and arch. Plan to see him back in one year with a followup CT scan both to evaluate his lungs and his descending thoracic aorta   Delight Ovens MD 05/30/2012 1:38 PM

## 2012-11-06 DIAGNOSIS — I059 Rheumatic mitral valve disease, unspecified: Secondary | ICD-10-CM | POA: Diagnosis not present

## 2012-11-06 DIAGNOSIS — I2510 Atherosclerotic heart disease of native coronary artery without angina pectoris: Secondary | ICD-10-CM | POA: Diagnosis not present

## 2012-11-06 DIAGNOSIS — E782 Mixed hyperlipidemia: Secondary | ICD-10-CM | POA: Diagnosis not present

## 2012-11-06 DIAGNOSIS — R0602 Shortness of breath: Secondary | ICD-10-CM | POA: Diagnosis not present

## 2012-11-14 DIAGNOSIS — H40059 Ocular hypertension, unspecified eye: Secondary | ICD-10-CM | POA: Diagnosis not present

## 2012-11-20 DIAGNOSIS — E782 Mixed hyperlipidemia: Secondary | ICD-10-CM | POA: Diagnosis not present

## 2012-11-20 DIAGNOSIS — I059 Rheumatic mitral valve disease, unspecified: Secondary | ICD-10-CM | POA: Diagnosis not present

## 2012-11-20 DIAGNOSIS — R0602 Shortness of breath: Secondary | ICD-10-CM | POA: Diagnosis not present

## 2012-11-20 DIAGNOSIS — I2510 Atherosclerotic heart disease of native coronary artery without angina pectoris: Secondary | ICD-10-CM | POA: Diagnosis not present

## 2012-12-03 DIAGNOSIS — H2510 Age-related nuclear cataract, unspecified eye: Secondary | ICD-10-CM | POA: Diagnosis not present

## 2012-12-03 DIAGNOSIS — H40019 Open angle with borderline findings, low risk, unspecified eye: Secondary | ICD-10-CM | POA: Diagnosis not present

## 2012-12-31 DIAGNOSIS — H2510 Age-related nuclear cataract, unspecified eye: Secondary | ICD-10-CM | POA: Diagnosis not present

## 2012-12-31 DIAGNOSIS — H269 Unspecified cataract: Secondary | ICD-10-CM | POA: Diagnosis not present

## 2013-01-14 DIAGNOSIS — I10 Essential (primary) hypertension: Secondary | ICD-10-CM | POA: Diagnosis not present

## 2013-01-14 DIAGNOSIS — H2510 Age-related nuclear cataract, unspecified eye: Secondary | ICD-10-CM | POA: Diagnosis not present

## 2013-01-14 DIAGNOSIS — H2589 Other age-related cataract: Secondary | ICD-10-CM | POA: Diagnosis not present

## 2013-01-14 DIAGNOSIS — H269 Unspecified cataract: Secondary | ICD-10-CM | POA: Diagnosis not present

## 2013-04-11 DIAGNOSIS — Z23 Encounter for immunization: Secondary | ICD-10-CM | POA: Diagnosis not present

## 2013-05-04 ENCOUNTER — Encounter: Payer: Self-pay | Admitting: Cardiology

## 2013-05-04 ENCOUNTER — Emergency Department (HOSPITAL_COMMUNITY): Payer: Medicare Other

## 2013-05-04 ENCOUNTER — Inpatient Hospital Stay (HOSPITAL_COMMUNITY)
Admission: EM | Admit: 2013-05-04 | Discharge: 2013-05-13 | DRG: 300 | Disposition: A | Discharge status: Home / Self Care | Payer: Medicare Other | Attending: Internal Medicine | Admitting: Internal Medicine

## 2013-05-04 ENCOUNTER — Encounter (HOSPITAL_COMMUNITY): Payer: Self-pay | Admitting: Emergency Medicine

## 2013-05-04 DIAGNOSIS — E872 Acidosis, unspecified: Secondary | ICD-10-CM | POA: Diagnosis present

## 2013-05-04 DIAGNOSIS — I82619 Acute embolism and thrombosis of superficial veins of unspecified upper extremity: Secondary | ICD-10-CM | POA: Diagnosis not present

## 2013-05-04 DIAGNOSIS — Z87891 Personal history of nicotine dependence: Secondary | ICD-10-CM | POA: Diagnosis not present

## 2013-05-04 DIAGNOSIS — D649 Anemia, unspecified: Secondary | ICD-10-CM | POA: Diagnosis present

## 2013-05-04 DIAGNOSIS — I700 Atherosclerosis of aorta: Secondary | ICD-10-CM | POA: Diagnosis not present

## 2013-05-04 DIAGNOSIS — Y9210 Unspecified residential institution as the place of occurrence of the external cause: Secondary | ICD-10-CM | POA: Diagnosis not present

## 2013-05-04 DIAGNOSIS — I2510 Atherosclerotic heart disease of native coronary artery without angina pectoris: Secondary | ICD-10-CM | POA: Diagnosis present

## 2013-05-04 DIAGNOSIS — I7101 Dissection of thoracic aorta: Principal | ICD-10-CM | POA: Diagnosis present

## 2013-05-04 DIAGNOSIS — Z7902 Long term (current) use of antithrombotics/antiplatelets: Secondary | ICD-10-CM | POA: Diagnosis not present

## 2013-05-04 DIAGNOSIS — R7309 Other abnormal glucose: Secondary | ICD-10-CM | POA: Diagnosis not present

## 2013-05-04 DIAGNOSIS — Z79899 Other long term (current) drug therapy: Secondary | ICD-10-CM

## 2013-05-04 DIAGNOSIS — I2582 Chronic total occlusion of coronary artery: Secondary | ICD-10-CM | POA: Diagnosis present

## 2013-05-04 DIAGNOSIS — R079 Chest pain, unspecified: Secondary | ICD-10-CM

## 2013-05-04 DIAGNOSIS — I71019 Dissection of thoracic aorta, unspecified: Secondary | ICD-10-CM | POA: Diagnosis not present

## 2013-05-04 DIAGNOSIS — J449 Chronic obstructive pulmonary disease, unspecified: Secondary | ICD-10-CM | POA: Diagnosis present

## 2013-05-04 DIAGNOSIS — E785 Hyperlipidemia, unspecified: Secondary | ICD-10-CM | POA: Diagnosis present

## 2013-05-04 DIAGNOSIS — Z7982 Long term (current) use of aspirin: Secondary | ICD-10-CM

## 2013-05-04 DIAGNOSIS — I4891 Unspecified atrial fibrillation: Secondary | ICD-10-CM | POA: Diagnosis not present

## 2013-05-04 DIAGNOSIS — Z952 Presence of prosthetic heart valve: Secondary | ICD-10-CM

## 2013-05-04 DIAGNOSIS — I7100 Dissection of unspecified site of aorta: Secondary | ICD-10-CM | POA: Diagnosis present

## 2013-05-04 DIAGNOSIS — I5022 Chronic systolic (congestive) heart failure: Secondary | ICD-10-CM | POA: Diagnosis present

## 2013-05-04 DIAGNOSIS — I10 Essential (primary) hypertension: Secondary | ICD-10-CM | POA: Diagnosis present

## 2013-05-04 DIAGNOSIS — I5023 Acute on chronic systolic (congestive) heart failure: Secondary | ICD-10-CM | POA: Diagnosis present

## 2013-05-04 DIAGNOSIS — J9819 Other pulmonary collapse: Secondary | ICD-10-CM | POA: Diagnosis not present

## 2013-05-04 DIAGNOSIS — I998 Other disorder of circulatory system: Secondary | ICD-10-CM | POA: Diagnosis not present

## 2013-05-04 DIAGNOSIS — I441 Atrioventricular block, second degree: Secondary | ICD-10-CM | POA: Diagnosis not present

## 2013-05-04 DIAGNOSIS — J438 Other emphysema: Secondary | ICD-10-CM | POA: Diagnosis not present

## 2013-05-04 DIAGNOSIS — Z86718 Personal history of other venous thrombosis and embolism: Secondary | ICD-10-CM | POA: Diagnosis present

## 2013-05-04 DIAGNOSIS — R109 Unspecified abdominal pain: Secondary | ICD-10-CM | POA: Diagnosis not present

## 2013-05-04 DIAGNOSIS — J90 Pleural effusion, not elsewhere classified: Secondary | ICD-10-CM | POA: Diagnosis present

## 2013-05-04 DIAGNOSIS — Z96659 Presence of unspecified artificial knee joint: Secondary | ICD-10-CM

## 2013-05-04 DIAGNOSIS — I509 Heart failure, unspecified: Secondary | ICD-10-CM | POA: Diagnosis not present

## 2013-05-04 DIAGNOSIS — I808 Phlebitis and thrombophlebitis of other sites: Secondary | ICD-10-CM | POA: Diagnosis not present

## 2013-05-04 DIAGNOSIS — I5042 Chronic combined systolic (congestive) and diastolic (congestive) heart failure: Secondary | ICD-10-CM | POA: Diagnosis present

## 2013-05-04 DIAGNOSIS — I4892 Unspecified atrial flutter: Secondary | ICD-10-CM | POA: Diagnosis not present

## 2013-05-04 DIAGNOSIS — R739 Hyperglycemia, unspecified: Secondary | ICD-10-CM | POA: Diagnosis present

## 2013-05-04 DIAGNOSIS — E876 Hypokalemia: Secondary | ICD-10-CM | POA: Diagnosis present

## 2013-05-04 DIAGNOSIS — Z9861 Coronary angioplasty status: Secondary | ICD-10-CM | POA: Diagnosis present

## 2013-05-04 DIAGNOSIS — Y849 Medical procedure, unspecified as the cause of abnormal reaction of the patient, or of later complication, without mention of misadventure at the time of the procedure: Secondary | ICD-10-CM | POA: Diagnosis not present

## 2013-05-04 DIAGNOSIS — J4489 Other specified chronic obstructive pulmonary disease: Secondary | ICD-10-CM | POA: Diagnosis present

## 2013-05-04 DIAGNOSIS — I712 Thoracic aortic aneurysm, without rupture, unspecified: Secondary | ICD-10-CM | POA: Diagnosis not present

## 2013-05-04 HISTORY — DX: Unspecified atrial flutter: I48.92

## 2013-05-04 HISTORY — DX: Dissection of unspecified site of aorta: I71.00

## 2013-05-04 LAB — CBC WITH DIFFERENTIAL/PLATELET
Basophils Absolute: 0 10*3/uL (ref 0.0–0.1)
Basophils Relative: 0 % (ref 0–1)
Hemoglobin: 12.4 g/dL — ABNORMAL LOW (ref 13.0–17.0)
MCHC: 32.9 g/dL (ref 30.0–36.0)
Monocytes Relative: 6 % (ref 3–12)
Neutro Abs: 10.3 10*3/uL — ABNORMAL HIGH (ref 1.7–7.7)
Neutrophils Relative %: 85 % — ABNORMAL HIGH (ref 43–77)
Platelets: 231 10*3/uL (ref 150–400)
RDW: 13.6 % (ref 11.5–15.5)

## 2013-05-04 LAB — COMPREHENSIVE METABOLIC PANEL
ALT: 12 U/L (ref 0–53)
AST: 23 U/L (ref 0–37)
Albumin: 4.2 g/dL (ref 3.5–5.2)
Alkaline Phosphatase: 52 U/L (ref 39–117)
Chloride: 97 mEq/L (ref 96–112)
Potassium: 3.3 mEq/L — ABNORMAL LOW (ref 3.5–5.1)
Sodium: 137 mEq/L (ref 135–145)
Total Bilirubin: 0.4 mg/dL (ref 0.3–1.2)
Total Protein: 7.2 g/dL (ref 6.0–8.3)

## 2013-05-04 LAB — POCT I-STAT TROPONIN I: Troponin i, poc: 0.01 ng/mL (ref 0.00–0.08)

## 2013-05-04 LAB — URINALYSIS, ROUTINE W REFLEX MICROSCOPIC
Bilirubin Urine: NEGATIVE
Specific Gravity, Urine: 1.039 — ABNORMAL HIGH (ref 1.005–1.030)
pH: 8 (ref 5.0–8.0)

## 2013-05-04 LAB — PROTIME-INR
INR: 1.15 (ref 0.00–1.49)
Prothrombin Time: 14.5 seconds (ref 11.6–15.2)

## 2013-05-04 LAB — MRSA PCR SCREENING: MRSA by PCR: NEGATIVE

## 2013-05-04 LAB — GLUCOSE, CAPILLARY
Glucose-Capillary: 134 mg/dL — ABNORMAL HIGH (ref 70–99)
Glucose-Capillary: 132 mg/dL — ABNORMAL HIGH (ref 70–99)
Glucose-Capillary: 120 mg/dL — ABNORMAL HIGH (ref 70–99)

## 2013-05-04 LAB — TYPE AND SCREEN: Antibody Screen: NEGATIVE

## 2013-05-04 LAB — APTT: aPTT: 26 seconds (ref 24–37)

## 2013-05-04 LAB — PRO B NATRIURETIC PEPTIDE: Pro B Natriuretic peptide (BNP): 484.8 pg/mL — ABNORMAL HIGH (ref 0–125)

## 2013-05-04 MED ORDER — METOCLOPRAMIDE HCL 5 MG/ML IJ SOLN
10.0000 mg | Freq: Four times a day (QID) | INTRAMUSCULAR | Status: AC
  Administered 2013-05-04 – 2013-05-06 (×8): 10 mg via INTRAVENOUS
  Filled 2013-05-04 (×8): qty 2

## 2013-05-04 MED ORDER — HYDROMORPHONE HCL PF 1 MG/ML IJ SOLN
1.0000 mg | Freq: Once | INTRAMUSCULAR | Status: AC
  Administered 2013-05-04: 1 mg via INTRAVENOUS
  Filled 2013-05-04: qty 1

## 2013-05-04 MED ORDER — IOHEXOL 350 MG/ML SOLN
100.0000 mL | Freq: Once | INTRAVENOUS | Status: AC | PRN
  Administered 2013-05-04: 100 mL via INTRAVENOUS

## 2013-05-04 MED ORDER — LABETALOL HCL 5 MG/ML IV SOLN: 20.0000 mg | Freq: Once | INTRAVENOUS | Status: DC

## 2013-05-04 MED ORDER — PANTOPRAZOLE SODIUM 20 MG PO TBEC
20.0000 mg | DELAYED_RELEASE_TABLET | Freq: Every day | ORAL | Status: DC
  Filled 2013-05-04: qty 1

## 2013-05-04 MED ORDER — HEPARIN SODIUM (PORCINE) 5000 UNIT/ML IJ SOLN
5000.0000 [IU] | Freq: Three times a day (TID) | INTRAMUSCULAR | Status: DC
  Administered 2013-05-04 – 2013-05-05 (×2): 5000 [IU] via SUBCUTANEOUS
  Filled 2013-05-04 (×5): qty 1

## 2013-05-04 MED ORDER — ONDANSETRON HCL 4 MG/2ML IJ SOLN
4.0000 mg | Freq: Once | INTRAMUSCULAR | Status: AC
  Administered 2013-05-04: 4 mg via INTRAVENOUS
  Filled 2013-05-04: qty 2

## 2013-05-04 MED ORDER — HYDROMORPHONE HCL PF 1 MG/ML IJ SOLN
0.5000 mg | INTRAMUSCULAR | Status: DC | PRN
  Administered 2013-05-04: 0.5 mg via INTRAVENOUS
  Filled 2013-05-04: qty 1

## 2013-05-04 MED ORDER — SIMVASTATIN 20 MG PO TABS
20.0000 mg | ORAL_TABLET | Freq: Every day | ORAL | Status: DC
  Administered 2013-05-04 – 2013-05-07 (×4): 20 mg via ORAL
  Filled 2013-05-04 (×6): qty 1

## 2013-05-04 MED ORDER — FENTANYL CITRATE 0.05 MG/ML IJ SOLN
50.0000 ug | INTRAMUSCULAR | Status: DC | PRN
  Administered 2013-05-04 – 2013-05-11 (×19): 50 ug via INTRAVENOUS
  Filled 2013-05-04 (×18): qty 2

## 2013-05-04 MED ORDER — ENALAPRIL MALEATE 5 MG PO TABS
5.0000 mg | ORAL_TABLET | Freq: Every day | ORAL | Status: DC
  Filled 2013-05-04: qty 1

## 2013-05-04 MED ORDER — SODIUM CHLORIDE 0.9 % IV SOLN
Freq: Once | INTRAVENOUS | Status: AC
  Administered 2013-05-04: 06:00:00 via INTRAVENOUS

## 2013-05-04 MED ORDER — INSULIN ASPART 100 UNIT/ML ~~LOC~~ SOLN
0.0000 [IU] | SUBCUTANEOUS | Status: DC
  Administered 2013-05-04 (×2): 2 [IU] via SUBCUTANEOUS
  Administered 2013-05-05 (×3): 1 [IU] via SUBCUTANEOUS
  Administered 2013-05-05: 2 [IU] via SUBCUTANEOUS

## 2013-05-04 MED ORDER — ESMOLOL HCL-SODIUM CHLORIDE 2000 MG/100ML IV SOLN
25.0000 ug/kg/min | INTRAVENOUS | Status: DC
  Administered 2013-05-04: 250 ug/kg/min via INTRAVENOUS
  Filled 2013-05-04: qty 100

## 2013-05-04 MED ORDER — POTASSIUM CHLORIDE 10 MEQ/100ML IV SOLN
10.0000 meq | INTRAVENOUS | Status: DC
Start: 2013-05-04 — End: 2013-05-04
  Administered 2013-05-04 (×2): 10 meq via INTRAVENOUS
  Filled 2013-05-04 (×2): qty 100

## 2013-05-04 MED ORDER — SODIUM CHLORIDE 0.9 % IV SOLN
250.0000 mL | INTRAVENOUS | Status: DC | PRN
  Administered 2013-05-05: 1000 mL via INTRAVENOUS

## 2013-05-04 MED ORDER — ESMOLOL HCL-SODIUM CHLORIDE 2000 MG/100ML IV SOLN
50.0000 ug/kg/min | Freq: Once | INTRAVENOUS | Status: AC
  Administered 2013-05-04: 500 ug/kg/min via INTRAVENOUS
  Administered 2013-05-04: 50 ug/kg/min via INTRAVENOUS
  Filled 2013-05-04: qty 100

## 2013-05-04 MED ORDER — POTASSIUM CHLORIDE 10 MEQ/100ML IV SOLN
10.0000 meq | INTRAVENOUS | Status: AC
  Administered 2013-05-04: 10 meq via INTRAVENOUS
  Filled 2013-05-04: qty 100

## 2013-05-04 MED ORDER — ONDANSETRON HCL 4 MG/2ML IJ SOLN
4.0000 mg | Freq: Four times a day (QID) | INTRAMUSCULAR | Status: DC | PRN
  Administered 2013-05-08 – 2013-05-11 (×2): 4 mg via INTRAVENOUS
  Filled 2013-05-04 (×2): qty 2

## 2013-05-04 MED ORDER — PANTOPRAZOLE SODIUM 40 MG IV SOLR
40.0000 mg | INTRAVENOUS | Status: DC
  Administered 2013-05-04 – 2013-05-05 (×2): 40 mg via INTRAVENOUS
  Filled 2013-05-04 (×3): qty 40

## 2013-05-04 MED ORDER — LABETALOL HCL 5 MG/ML IV SOLN
0.5000 mg/min | INTRAVENOUS | Status: DC
  Administered 2013-05-04: 1.5 mg/min via INTRAVENOUS
  Administered 2013-05-04: 0.5 mg/min via INTRAVENOUS
  Administered 2013-05-04: 2 mg/min via INTRAVENOUS
  Administered 2013-05-05: 2.5 mg/min via INTRAVENOUS
  Administered 2013-05-05: 3 mg/min via INTRAVENOUS
  Administered 2013-05-05 – 2013-05-06 (×3): 2 mg/min via INTRAVENOUS
  Administered 2013-05-06: 2.5 mg/min via INTRAVENOUS
  Filled 2013-05-04 (×9): qty 100

## 2013-05-04 MED ORDER — AMLODIPINE BESYLATE 5 MG PO TABS
5.0000 mg | ORAL_TABLET | Freq: Every day | ORAL | Status: DC
  Administered 2013-05-05: 5 mg via ORAL
  Filled 2013-05-04 (×3): qty 1

## 2013-05-04 MED ORDER — FENTANYL CITRATE 0.05 MG/ML IJ SOLN
50.0000 ug | Freq: Once | INTRAMUSCULAR | Status: AC
  Administered 2013-05-04: 50 ug via INTRAVENOUS
  Filled 2013-05-04: qty 2

## 2013-05-04 MED ORDER — NITROPRUSSIDE SODIUM 25 MG/ML IV SOLN
0.2500 ug/kg/min | INTRAVENOUS | Status: DC
  Administered 2013-05-04: 0.2 ug/kg/min via INTRAVENOUS
  Filled 2013-05-04: qty 2

## 2013-05-04 MED ORDER — ONDANSETRON HCL 4 MG/2ML IJ SOLN
INTRAMUSCULAR | Status: AC
  Administered 2013-05-04: 4 mg
  Filled 2013-05-04: qty 2

## 2013-05-04 NOTE — ED Notes (Signed)
Pts SpO2 dropped to 70%, pt placed on non-re breather.

## 2013-05-04 NOTE — H&P (Addendum)
PULMONARY  / CRITICAL CARE MEDICINE HISTORY AND PHYSICAL EXAMINATION  Name: [name] [name] MRN: [id number] DOB: July 21, 1940    ADMISSION DATE:  05/04/2013  CHIEF COMPLAINT:  Back and abdominal pain  BRIEF PATIENT DESCRIPTION: 72 year-old male with previous aortic dissection presenting with new type B aortic dissection. Cardiothoracic surgery requests critical care admission for blood pressure control.  SIGNIFICANT EVENTS / STUDIES:  1. CT Angiogram 11/16   LINES / TUBES: 1. PIV  CULTURES: 1. None  ANTIBIOTICS: 1. None  HISTORY OF PRESENT ILLNESS: [name] [name] is a 72 year old male with prior aortic valve replacement who presents to [facility] with abdominal pain and back pain. The pain began spontaneously while the patient was watching football. It was severe and tearing in nature. It was reminiscent of previous pain he had when he was first found to have an aortic aneurysm many years ago. There is no associated fevers, chills, shortness of breath, nausea, or vomiting. He did feel a little lightheaded when the pain began.  PAST MEDICAL HISTORY :  Past Medical History  Diagnosis Date  . Hyperlipidemia   . DVT (deep venous thrombosis)     post knee surgery  . Tobacco abuse   . HTN (hypertension)   . Descending aortic aneurysm   . Aortic valve insufficiency     Past Surgical History  Procedure Laterality Date  . Right groin lymphocele  [id number]  . Aortic valve replacement  [id number]  . Replacement total knee      bilateral    Prior to Admission medications   Medication Sig Start Date End Date Taking? Authorizing Provider  amLODipine (NORVASC) 5 MG tablet Take 5 mg by mouth daily.     Yes Historical Provider, MD  aspirin EC 81 MG tablet Take 81 mg by mouth daily.     Yes Historical Provider, MD  clopidogrel (PLAVIX) 75 MG tablet Take 75 mg by mouth daily.     Yes Historical Provider, MD  enalapril (VASOTEC) 5 MG tablet Take 5 mg by mouth daily.     Yes Historical  Provider, MD  furosemide (LASIX) 40 MG tablet Take 40 mg by mouth 2 (two) times daily. PRN   Yes Historical Provider, MD  lansoprazole (PREVACID) 30 MG capsule Take 30 mg by mouth daily.    Yes Historical Provider, MD  loratadine (CLARITIN) 10 MG tablet Take 10 mg by mouth daily.     Yes Historical Provider, MD  metoprolol (TOPROL-XL) 50 MG 24 hr tablet Take 50 mg by mouth 2 (two) times daily.     Yes Historical Provider, MD  nitroGLYCERIN (NITROSTAT) 0.4 MG SL tablet Place 0.4 mg under the tongue every 5 (five) minutes as needed.     Yes Historical Provider, MD  potassium chloride SA (K-DUR,KLOR-CON) 20 MEQ tablet Take 20 mEq by mouth daily.    Yes Historical Provider, MD  simvastatin (ZOCOR) 20 MG tablet Take 20 mg by mouth at bedtime.     Yes Historical Provider, MD    Allergies  Allergen Reactions  . Lortab [Hydrocodone-Acetaminophen] Hives  . Morphine And Related Hives    FAMILY HISTORY:  No family history on file.  SOCIAL HISTORY:  reports that he quit smoking about 39 years ago. His smoking use included Cigarettes. He smoked 0.00 packs per day. He has never used smokeless tobacco. He reports that he does not drink alcohol or use illicit drugs.  REVIEW OF SYSTEMS:  A 12-system ROS was conducted and, unless otherwise specified in  the HPI, was negative.    PHYSICAL EXAM  VITAL SIGNS: Temp:  [98 F (36.7 C)-98.5 F (36.9 C)] 98.5 F (36.9 C) (11/16 0615) Pulse Rate:  [57-86] 81 (11/16 0615) Resp:  [5-29] 29 (11/16 0615) BP: (136-179)/(59-79) 164/70 mmHg (11/16 0615) SpO2:  [99 %-100 %] 100 % (11/16 0615) Weight:  [178 lb 9.2 oz (81 kg)-191 lb 12.8 oz (87 kg)] 178 lb 9.2 oz (81 kg) (11/16 0615)  HEMODYNAMICS:    VENTILATOR SETTINGS:    INTAKE / OUTPUT: Intake/Output   None     PHYSICAL EXAMINATION: General:  Elderly male in no acute distress. Neuro:  Intact. HEENT:  Sclera anicteric, conjunctiva pink. Mucous membranes moist, oropharynx clear. Neck:  Trachea  supple and midline. No lymphadenopathy or JVD. Cardiovascular:  Regular rate and rhythm, normal S1-S2, no murmurs rubs or gallops. Lungs:  Clear to auscultation bilaterally. Abdomen: Soft, nontender, nondistended, positive bowel sounds.  Musculoskeletal:  No clubbing cyanosis or edema.  Skin:  Intact.   LABS:  CBC Recent Labs     05/04/13  0155  WBC  12.1*  HGB  12.4*  HCT  37.7*  PLT  231    Coag's Recent Labs     05/04/13  0155  APTT  26  INR  1.15    BMET Recent Labs     05/04/13  0155  NA  137  K  3.3*  CL  97  CO2  26  BUN  15  CREATININE  0.76  GLUCOSE  155*    Electrolytes Recent Labs     05/04/13  0155  CALCIUM  9.3    Sepsis Markers No results found for this basename: LACTICACIDVEN, PROCALCITON, O2SATVEN,  in the last 72 hours  ABG No results found for this basename: PHART, PCO2ART, PO2ART,  in the last 72 hours  Liver Enzymes Recent Labs     05/04/13  0155  AST  23  ALT  12  ALKPHOS  52  BILITOT  0.4  ALBUMIN  4.2    Cardiac Enzymes Recent Labs     05/04/13  0155  PROBNP  484.8*    Glucose Recent Labs     05/04/13  0227  GLUCAP  134*    Imaging Dg Chest Portable 1 View  05/04/2013   CLINICAL DATA:  Chest pain.  EXAM: PORTABLE CHEST - 1 VIEW  COMPARISON:  Chest radiograph performed 07/28/2010, and CTA of the chest performed 05/30/2012  FINDINGS: The lungs are well-aerated. Pulmonary vascularity is at the upper limits of normal. Mild left basilar atelectasis is noted. There is no evidence of pleural effusion or pneumothorax.  The cardiomediastinal silhouette is borderline enlarged; the patient is status post median sternotomy. No acute osseous abnormalities are seen.  IMPRESSION: Mild left basilar atelectasis noted; lungs otherwise clear. Borderline cardiomegaly noted.   Electronically Signed   By: Roanna Raider M.D.   On: 05/04/2013 02:22   Ct Angio Chest Aortic Dissect W &/or W/o  05/04/2013   CLINICAL DATA:  Chest pain  radiating to the back.  EXAM: CT ANGIOGRAPHY CHEST WITH CONTRAST  TECHNIQUE: Multidetector CT imaging of the chest was performed using the standard protocol during bolus administration of intravenous contrast. Multiplanar CT image reconstructions including MIPs were obtained to evaluate the vascular anatomy.  CONTRAST:  OMNIPAQUE IOHEXOL 350 MG/ML SOLN  COMPARISON:  CTA of the chest performed 05/30/2012  FINDINGS: The thoracic aorta is dilated to 4.5 cm in maximal diameter, perhaps slightly more prominent  than on the prior study. There is a new dissection flap arising from the distal aspect of the aortic arch, with a single fenestration at the proximal aspect of the dissection, and decreased enhancement of blood within the false lumen. The distal aspect of the false lumen appears to be fully clotted; the false lumen resolves just proximal to the level of the diaphragm. This causes mild luminal narrowing along the descending thoracic aorta.  The patient is status post ascending aortic repair; postoperative change is unremarkable in appearance. The great vessels are unremarkable in appearance. There is no evidence of pulmonary embolus.  Mild left basilar atelectasis is noted. Minimal emphysematous change is noted at the lung apices. The lungs are otherwise clear. There is no evidence of pleural effusion or pneumothorax. No masses are identified.  No mediastinal lymphadenopathy is appreciated. No pericardial effusion is seen. The patient is status post median sternotomy. No axillary lymphadenopathy is seen. The thyroid gland is unremarkable in appearance.  The visualized portions of the liver and spleen are unremarkable. A 3.2 cm cyst is noted at the upper pole of the right kidney.  No acute osseous abnormalities are seen. Mild degenerative change is noted at the lower cervical spine.  Review of the MIP images confirms the above findings.  IMPRESSION: 1. New dissection flap arising from the distal aspect of the  aortic arch, extending along the descending thoracic aorta and resolving just proximal to the level of the diaphragm. No evidence of involvement of branch vessels. The distal aspect of the false lumen appears to be fully clotted, while there is decreased enhancement of blood within the more proximal false lumen, with a single fenestration noted at the proximal aspect of the dissection. Associated mild luminal narrowing along the descending thoracic aorta. The thoracic aorta measures 4.5 cm in maximal diameter, perhaps slightly more prominent than on the prior study. 2. Ascending thoracic aorta repair is unremarkable in appearance. 3. No evidence of pulmonary embolus. 4. Mild left basilar atelectasis noted; minimal emphysematous change at the lung apices. Lungs otherwise clear. 5. Right renal cyst noted.  These results were called by telephone at the time of interpretation on 05/04/2013 at 4:10 AM to Dr. Brandt Loosen , who verbally acknowledged these results.   Electronically Signed   By: Roanna Raider M.D.   On: 05/04/2013 04:27    ASSESSMENT / PLAN: Principal Problem:   Aortic dissection Active Problems:   HTN (hypertension)   Lactic acidosis   Anemia   Hypokalemia   Hyperglycemia   PULMONARY A/P: 1. No acute issue  CARDIOVASCULAR A/P:  1. Acute type B aortic dissection: Cardiothoracic surgery is aware and plan to see the patient first thing in the morning. In the meantime, we will maintain aggressive blood pressure control with a goal of 120/80. 2. Elevated lactate: There is minimal elevation of the serum lactate which may represent mild hypoperfusion of some distal targets. 3. Hypertension  Hold home ACE inhibitor given potential for renal insufficiency  Esmolol drip to obtain goal blood pressure  Cardiothoracic surgery evaluation in the morning  RENAL A/P:  1. Hypokalemia:   Will replete K  GASTROINTESTINAL A/P:  1. No Acute Issue  HEMATOLOGIC A/P:   1. Anemia: This is  mild and likely secondary to the patient's acute sequestration within the false lumen.    Monitor  Type and screen and preparation for surgery  INFECTIOUS A/P: 1. No Acute Issue  ENDOCRINE A/P: 1. Hyperglycemia: This has been noted on previous admissions.  SSI  Check A1c  NEUROLOGIC A/P: 1. No acute issue  BEST PRACTICE / DISPOSITION Level of Care:  ICU Consultants:  CTS Code Status:  Full Diet:  NPO in preparation for surgery DVT Px:  SCDs only GI Px:  Not indicated Skin Integrity:  Intact Social / Family:  Updated by patient  TODAY'S SUMMARY:   I have personally obtained a history, examined the patient, evaluated laboratory and imaging results, formulated the assessment and plan and placed orders.  CRITICAL CARE: The patient is critically ill with multiple organ systems failure and requires high complexity decision making for assessment and support, frequent evaluation and titration of therapies, application of advanced monitoring technologies and extensive interpretation of multiple databases. Critical Care Time devoted to patient care services described in this note is 30 minutes.   Evalyn Casco, MD Pulmonary and Critical Care Medicine [facility] Pager: [phone]  05/04/2013, 6:31 AM

## 2013-05-04 NOTE — ED Provider Notes (Addendum)
CSN: [id number]     Arrival date & time 05/04/13  0123 History   First MD Initiated Contact with Patient 05/04/13 0145     Chief Complaint  Patient presents with  . Chest Pain   (Consider location/radiation/quality/duration/timing/severity/associated sxs/prior Treatment) HPI Patient is a 72 yo man with a history of CAD - s/p PTCA to single vessel in 2010. He is also s/p repair of ascending aortic aneurysm and AVR. He has a history of HTN, COPD, DVT.  He presents shortly after the onset of severe, centrally located, pressure like chest pain which radiates to the back. It is 10/10 in severity and associated with diaphoresis. The pain radiates to the mid back. Patient has been nauseated and vomited once PTA. He denies abdominal pain. His sx feel similar to those which he experienced prior to cardiac cath which resulted in PTCA. The patient does not have chronic angina. He was in bed trying to fall asleep when pain began.   Denies cough, pleuritic pain, SOB, fever.  The patient had ASA 324mg  and NTG SL x 3 PTA. NTG helped "just a little".   Past Medical History  Diagnosis Date  . Hyperlipidemia   . DVT (deep venous thrombosis)     post knee surgery  . Tobacco abuse   . HTN (hypertension)   . Descending aortic aneurysm   . Aortic valve insufficiency    Past Surgical History  Procedure Laterality Date  . Right groin lymphocele  [id number]  . Aortic valve replacement  [id number]   No family history on file. History  Substance Use Topics  . Smoking status: Former Smoker    Types: Cigarettes    Quit date: 04/16/1974  . Smokeless tobacco: Never Used  . Alcohol Use: No    Review of Systems 10 point ROS performed and is negative with the exception of sx noted above.   Allergies  Lortab and Morphine and related  Home Medications   Current Outpatient Rx  Name  Route  Sig  Dispense  Refill  . amLODipine (NORVASC) 5 MG tablet   Oral   Take 5 mg by mouth daily.           Marland Kitchen aspirin  EC 81 MG tablet   Oral   Take 81 mg by mouth daily.           . clopidogrel (PLAVIX) 75 MG tablet   Oral   Take 75 mg by mouth daily.           . enalapril (VASOTEC) 5 MG tablet   Oral   Take 5 mg by mouth daily.           . furosemide (LASIX) 40 MG tablet   Oral   Take 40 mg by mouth 2 (two) times daily. PRN         . lansoprazole (PREVACID) 30 MG capsule   Oral   Take 30 mg by mouth daily.           Marland Kitchen loratadine (CLARITIN) 10 MG tablet   Oral   Take 10 mg by mouth daily.           . metoprolol (TOPROL-XL) 50 MG 24 hr tablet   Oral   Take 50 mg by mouth 2 (two) times daily.           . nitroGLYCERIN (NITROSTAT) 0.4 MG SL tablet   Sublingual   Place 0.4 mg under the tongue every 5 (five) minutes as needed.           Marland Kitchen  potassium chloride SA (K-DUR,KLOR-CON) 20 MEQ tablet   Oral   Take 20 mEq by mouth daily.          . simvastatin (ZOCOR) 20 MG tablet   Oral   Take 20 mg by mouth at bedtime.            There were no vitals taken for this visit. Physical Exam Gen: well developed and well nourished appearing, extremely uncomfortable appearing, mildly agitated appearing. Head: NCAT Eyes: PERL, EOMI Nose: no epistaixis or rhinorrhea Mouth/throat: mucosa is moist and pink Neck: supple, no stridor Lungs: CTA B, no wheezing, rhonchi or rales, respiratory rate 32-36 per minute CV: RRR, mild systolic murmur, good extremity pulses  Chest wall: Well healed midline sternotomy incision, no chest wall tenderness Abd: soft, notender, nondistended Back: no midline ttp Skin: warm and dry, no lesions Extremities: Nontender, no edema Neuro: CN ii-xii grossly intact, no focal deficits, normal speech, normal gait.  Psyche; anxious affect,  calm and cooperative.   ED Course  Procedures (including critical care time) Labs Review Imaging Review No results found.  Results for orders placed during the hospital encounter of 05/04/13 (from the past 24 hour(s))   TYPE AND SCREEN     Status: None   Collection Time    05/04/13  1:47 AM      Result Value Range   ABO/RH(D) A POS     Antibody Screen NEG     Sample Expiration 05/07/2013    PRO B NATRIURETIC PEPTIDE     Status: Abnormal   Collection Time    05/04/13  1:55 AM      Result Value Range   Pro B Natriuretic peptide (BNP) 484.8 (*) 0 - 125 pg/mL  CBC WITH DIFFERENTIAL     Status: Abnormal   Collection Time    05/04/13  1:55 AM      Result Value Range   WBC 12.1 (*) 4.0 - 10.5 K/uL   RBC 4.05 (*) 4.22 - 5.81 MIL/uL   Hemoglobin 12.4 (*) 13.0 - 17.0 g/dL   HCT 16.1 (*) 09.6 - 04.5 %   MCV 93.1  78.0 - 100.0 fL   MCH 30.6  26.0 - 34.0 pg   MCHC 32.9  30.0 - 36.0 g/dL   RDW 40.9  81.1 - 91.4 %   Platelets 231  150 - 400 K/uL   Neutrophils Relative % 85 (*) 43 - 77 %   Neutro Abs 10.3 (*) 1.7 - 7.7 K/uL   Lymphocytes Relative 8 (*) 12 - 46 %   Lymphs Abs 1.0  0.7 - 4.0 K/uL   Monocytes Relative 6  3 - 12 %   Monocytes Absolute 0.7  0.1 - 1.0 K/uL   Eosinophils Relative 1  0 - 5 %   Eosinophils Absolute 0.1  0.0 - 0.7 K/uL   Basophils Relative 0  0 - 1 %   Basophils Absolute 0.0  0.0 - 0.1 K/uL  COMPREHENSIVE METABOLIC PANEL     Status: Abnormal   Collection Time    05/04/13  1:55 AM      Result Value Range   Sodium 137  135 - 145 mEq/L   Potassium 3.3 (*) 3.5 - 5.1 mEq/L   Chloride 97  96 - 112 mEq/L   CO2 26  19 - 32 mEq/L   Glucose, Bld 155 (*) 70 - 99 mg/dL   BUN 15  6 - 23 mg/dL   Creatinine, Ser 7.82  0.50 -  1.35 mg/dL   Calcium 9.3  8.4 - 11.9 mg/dL   Total Protein 7.2  6.0 - 8.3 g/dL   Albumin 4.2  3.5 - 5.2 g/dL   AST 23  0 - 37 U/L   ALT 12  0 - 53 U/L   Alkaline Phosphatase 52  39 - 117 U/L   Total Bilirubin 0.4  0.3 - 1.2 mg/dL   GFR calc non Af Amer 88 (*) >90 mL/min   GFR calc Af Amer >90  >90 mL/min  LACTIC ACID, PLASMA     Status: Abnormal   Collection Time    05/04/13  1:55 AM      Result Value Range   Lactic Acid, Venous 3.2 (*) 0.5 - 2.2 mmol/L   LIPASE, BLOOD     Status: None   Collection Time    05/04/13  1:55 AM      Result Value Range   Lipase 23  11 - 59 U/L  PROTIME-INR     Status: None   Collection Time    05/04/13  1:55 AM      Result Value Range   Prothrombin Time 14.5  11.6 - 15.2 seconds   INR 1.15  0.00 - 1.49  APTT     Status: None   Collection Time    05/04/13  1:55 AM      Result Value Range   aPTT 26  24 - 37 seconds  POCT I-STAT TROPONIN I     Status: None   Collection Time    05/04/13  2:00 AM      Result Value Range   Troponin i, poc 0.00  0.00 - 0.08 ng/mL   Comment 3           GLUCOSE, CAPILLARY     Status: Abnormal   Collection Time    05/04/13  2:27 AM      Result Value Range   Glucose-Capillary 134 (*) 70 - 99 mg/dL     EKG: NSR, pulse 76 bpm, non specific IVCD, borderline ST depression diffusely - unable to obtain old EKG for comparison. Normal axis and intervals.   MDM  DDX: PTX, PNA, ACS, failure of TAD repair with recurrent aneuyrsm, PE, pleural effusion, pericarditis  0200:  Pain relieved only slightly - from 10 to 9.5 - after dilaudid 1mg  IV. We will repeat and initiated NTG gtt.   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 At about the same time, the patient developed recurrent severe chest pain.  Noted to have increase in BP in the 160s systolic. We are continuing to control pain and have initiated emsolol gtt with goal SBP of 100 to 110.   0430:  Case dissussed with Dr. Laneta Simmers of [facility]. He advises that the patient's treatment is medical although, he personally consult on the patient within the next few hours. He agrees with management of pain and BP and recommends admission to the CCM service. Consult to CCM initiated.   The patient and family have been advised of the patient's diagnosis.   74: Case discussed with CCM fellow who will see and admit the patient to the ICU.   0450: Findings, mechanism and detailed  medical management plan of BP control explained to the patient's son and daughter. We are titrating [place] up.   Brandt Loosen, MD 05/04/13 0500  CRITICAL CARE Performed by: Brandt Loosen   Total critical care time: 129m  Critical care  time was exclusive of separately billable procedures and treating other patients.  Critical care was necessary to treat or prevent imminent or life-threatening deterioration.  Critical care was time spent personally by me on the following activities: development of treatment plan with patient and/or surrogate as well as nursing, discussions with consultants, evaluation of patient's response to treatment, examination of patient, obtaining history from patient or surrogate, ordering and performing treatments and interventions, ordering and review of laboratory studies, ordering and review of radiographic studies, pulse oximetry and re-evaluation of patient's condition.   Brandt Loosen, MD 05/04/13 0500

## 2013-05-04 NOTE — ED Notes (Signed)
Per EMS, pt began having chest pain at 11:00 mid sternal and epigastric radiating to back. Pt took 3 nitro at home with some relief. EMS gave 324 of aspirin and of LR.

## 2013-05-04 NOTE — ED Notes (Signed)
Patient transported to CT 

## 2013-05-04 NOTE — Consult Note (Signed)
Cardiothoracic Surgery Consultation:  Reason for Consult: Acute type B aortic dissection Referring Physician:  Dr. Lollie Marrow [name] [name] is an 72 y.o. male.  HPI:   The patient is a 72 year old gentleman who underwent replacement of his aortic valve with a pericardial valve, replacement of the ascending aorta and arch to the left subclavian, and bypass to the innominate artery on 02/19/2009 by Dr. Tyrone Sage for acute type A aortic dissection. He subsequently had a BMS placed in the mid left circumflex by Dr. Verdis Prime in 07/2010 for high grade stenosis with chest pain and CHF and was noted to have an occluded RCA at that time. He has been followed in the office by Dr. Tyrone Sage with yearly CT scans to check the remainder of the aorta and the last one in 05/2012 was stable. The patient reports that he has been having some episodes of chest discomfort with exertion for a while and they have been relieved with SL NTG. The last night he developed sudden severe back pain radiating through to the chest. It was similar to the pain he had with his aortic dissection so he came to the ER. CTA of the chest shows a new type B aortic dissection extending from the left subclavian down to the diaphragm with thrombosis of the false lumen and no sign of leakage or rupture. The aorta looks about the same size as last year at 4.5 cm. He is now in the ICU on esmolol drip with SBP still 160 and still having some pain and nausea.   Past Medical History  Diagnosis Date  . Hyperlipidemia   . DVT (deep venous thrombosis)     post knee surgery  . Tobacco abuse   . HTN (hypertension)   . Descending aortic aneurysm   . Aortic valve insufficiency     Past Surgical History  Procedure Laterality Date  . Right groin lymphocele  [id number]  . Aortic valve replacement  [id number]  . Replacement total knee      bilateral    No family history on file.  Social History:  reports that he quit smoking about 39 years  ago. His smoking use included Cigarettes. He smoked 0.00 packs per day. He has never used smokeless tobacco. He reports that he does not drink alcohol or use illicit drugs.  Allergies:  Allergies  Allergen Reactions  . Lortab [Hydrocodone-Acetaminophen] Hives  . Morphine And Related Hives    Medications:  I have reviewed the patient's current medications. Prior to Admission:  Prescriptions prior to admission  Medication Sig Dispense Refill  . amLODipine (NORVASC) 5 MG tablet Take 5 mg by mouth daily.        Marland Kitchen aspirin EC 81 MG tablet Take 81 mg by mouth daily.        . clopidogrel (PLAVIX) 75 MG tablet Take 75 mg by mouth daily.        . enalapril (VASOTEC) 5 MG tablet Take 5 mg by mouth daily.        . furosemide (LASIX) 40 MG tablet Take 40 mg by mouth 2 (two) times daily. PRN      . lansoprazole (PREVACID) 30 MG capsule Take 30 mg by mouth daily.       Marland Kitchen loratadine (CLARITIN) 10 MG tablet Take 10 mg by mouth daily.        . metoprolol (TOPROL-XL) 50 MG 24 hr tablet Take 50 mg by mouth 2 (two) times daily.        Marland Kitchen  nitroGLYCERIN (NITROSTAT) 0.4 MG SL tablet Place 0.4 mg under the tongue every 5 (five) minutes as needed.        . potassium chloride SA (K-DUR,KLOR-CON) 20 MEQ tablet Take 20 mEq by mouth daily.       . simvastatin (ZOCOR) 20 MG tablet Take 20 mg by mouth at bedtime.         Scheduled: . amLODipine  5 mg Oral Daily  . heparin  5,000 Units Subcutaneous Q8H  . insulin aspart  0-9 Units Subcutaneous Q4H  . metoCLOPramide (REGLAN) injection  10 mg Intravenous Q6H  . pantoprazole (PROTONIX) IV  40 mg Intravenous Q24H  . potassium chloride  10 mEq Intravenous Q1 Hr x 3  . simvastatin  20 mg Oral QHS   Continuous: . labetalol (NORMODYNE) infusion 1 mg/min (05/04/13 1100)  . nitroPRUSSide Stopped (05/04/13 1100)   WGN:FAOZHY chloride, fentaNYL, ondansetron (ZOFRAN) IV Anti-infectives   None      Results for orders placed during the hospital encounter of 05/04/13  (from the past 48 hour(s))  TYPE AND SCREEN     Status: None   Collection Time    05/04/13  1:47 AM      Result Value Range   ABO/RH(D) A POS     Antibody Screen NEG     Sample Expiration 05/07/2013    PRO B NATRIURETIC PEPTIDE     Status: Abnormal   Collection Time    05/04/13  1:55 AM      Result Value Range   Pro B Natriuretic peptide (BNP) 484.8 (*) 0 - 125 pg/mL  CBC WITH DIFFERENTIAL     Status: Abnormal   Collection Time    05/04/13  1:55 AM      Result Value Range   WBC 12.1 (*) 4.0 - 10.5 K/uL   RBC 4.05 (*) 4.22 - 5.81 MIL/uL   Hemoglobin 12.4 (*) 13.0 - 17.0 g/dL   HCT 86.5 (*) 78.4 - 69.6 %   MCV 93.1  78.0 - 100.0 fL   MCH 30.6  26.0 - 34.0 pg   MCHC 32.9  30.0 - 36.0 g/dL   RDW 29.5  28.4 - 13.2 %   Platelets 231  150 - 400 K/uL   Neutrophils Relative % 85 (*) 43 - 77 %   Neutro Abs 10.3 (*) 1.7 - 7.7 K/uL   Lymphocytes Relative 8 (*) 12 - 46 %   Lymphs Abs 1.0  0.7 - 4.0 K/uL   Monocytes Relative 6  3 - 12 %   Monocytes Absolute 0.7  0.1 - 1.0 K/uL   Eosinophils Relative 1  0 - 5 %   Eosinophils Absolute 0.1  0.0 - 0.7 K/uL   Basophils Relative 0  0 - 1 %   Basophils Absolute 0.0  0.0 - 0.1 K/uL  COMPREHENSIVE METABOLIC PANEL     Status: Abnormal   Collection Time    05/04/13  1:55 AM      Result Value Range   Sodium 137  135 - 145 mEq/L   Potassium 3.3 (*) 3.5 - 5.1 mEq/L   Chloride 97  96 - 112 mEq/L   CO2 26  19 - 32 mEq/L   Glucose, Bld 155 (*) 70 - 99 mg/dL   BUN 15  6 - 23 mg/dL   Creatinine, Ser 4.40  0.50 - 1.35 mg/dL   Calcium 9.3  8.4 - 10.2 mg/dL   Total Protein 7.2  6.0 - 8.3 g/dL  Albumin 4.2  3.5 - 5.2 g/dL   AST 23  0 - 37 U/L   ALT 12  0 - 53 U/L   Alkaline Phosphatase 52  39 - 117 U/L   Total Bilirubin 0.4  0.3 - 1.2 mg/dL   GFR calc non Af Amer 88 (*) >90 mL/min   GFR calc Af Amer >90  >90 mL/min   Comment: (NOTE)     The eGFR has been calculated using the CKD EPI equation.     This calculation has not been validated in all  clinical situations.     eGFR's persistently <90 mL/min signify possible Chronic Kidney     Disease.  LACTIC ACID, PLASMA     Status: Abnormal   Collection Time    05/04/13  1:55 AM      Result Value Range   Lactic Acid, Venous 3.2 (*) 0.5 - 2.2 mmol/L  LIPASE, BLOOD     Status: None   Collection Time    05/04/13  1:55 AM      Result Value Range   Lipase 23  11 - 59 U/L  PROTIME-INR     Status: None   Collection Time    05/04/13  1:55 AM      Result Value Range   Prothrombin Time 14.5  11.6 - 15.2 seconds   INR 1.15  0.00 - 1.49  APTT     Status: None   Collection Time    05/04/13  1:55 AM      Result Value Range   aPTT 26  24 - 37 seconds  POCT I-STAT TROPONIN I     Status: None   Collection Time    05/04/13  2:00 AM      Result Value Range   Troponin i, poc 0.00  0.00 - 0.08 ng/mL   Comment 3            Comment: Due to the release kinetics of cTnI,     a negative result within the first hours     of the onset of symptoms does not rule out     myocardial infarction with certainty.     If myocardial infarction is still suspected,     repeat the test at appropriate intervals.  GLUCOSE, CAPILLARY     Status: Abnormal   Collection Time    05/04/13  2:27 AM      Result Value Range   Glucose-Capillary 134 (*) 70 - 99 mg/dL  URINALYSIS, ROUTINE W REFLEX MICROSCOPIC     Status: Abnormal   Collection Time    05/04/13  4:39 AM      Result Value Range   Color, Urine STRAW (*) YELLOW   APPearance CLEAR  CLEAR   Specific Gravity, Urine 1.039 (*) 1.005 - 1.030   pH 8.0  5.0 - 8.0   Glucose, UA NEGATIVE  NEGATIVE mg/dL   Hgb urine dipstick TRACE (*) NEGATIVE   Bilirubin Urine NEGATIVE  NEGATIVE   Ketones, ur NEGATIVE  NEGATIVE mg/dL   Protein, ur NEGATIVE  NEGATIVE mg/dL   Urobilinogen, UA 0.2  0.0 - 1.0 mg/dL   Nitrite NEGATIVE  NEGATIVE   Leukocytes, UA NEGATIVE  NEGATIVE  URINE MICROSCOPIC-ADD ON     Status: None   Collection Time    05/04/13  4:39 AM      Result  Value Range   WBC, UA 0-2  <3 WBC/hpf   RBC / HPF 0-2  <3 RBC/hpf  POCT  I-STAT TROPONIN I     Status: None   Collection Time    05/04/13  4:44 AM      Result Value Range   Troponin i, poc 0.01  0.00 - 0.08 ng/mL   Comment 3            Comment: Due to the release kinetics of cTnI,     a negative result within the first hours     of the onset of symptoms does not rule out     myocardial infarction with certainty.     If myocardial infarction is still suspected,     repeat the test at appropriate intervals.  MRSA PCR SCREENING     Status: None   Collection Time    05/04/13  6:07 AM      Result Value Range   MRSA by PCR NEGATIVE  NEGATIVE   Comment:            The GeneXpert MRSA Assay (FDA     approved for NASAL specimens     only), is one component of a     comprehensive MRSA colonization     surveillance program. It is not     intended to diagnose MRSA     infection nor to guide or     monitor treatment for     MRSA infections.  GLUCOSE, CAPILLARY     Status: Abnormal   Collection Time    05/04/13  8:50 AM      Result Value Range   Glucose-Capillary 138 (*) 70 - 99 mg/dL   Comment 1 Notify RN      Dg Chest Portable 1 View  05/04/2013   CLINICAL DATA:  Chest pain.  EXAM: PORTABLE CHEST - 1 VIEW  COMPARISON:  Chest radiograph performed 07/28/2010, and CTA of the chest performed 05/30/2012  FINDINGS: The lungs are well-aerated. Pulmonary vascularity is at the upper limits of normal. Mild left basilar atelectasis is noted. There is no evidence of pleural effusion or pneumothorax.  The cardiomediastinal silhouette is borderline enlarged; the patient is status post median sternotomy. No acute osseous abnormalities are seen.  IMPRESSION: Mild left basilar atelectasis noted; lungs otherwise clear. Borderline cardiomegaly noted.   Electronically Signed   By: Roanna Raider M.D.   On: 05/04/2013 02:22   Ct Angio Chest Aortic Dissect W &/or W/o  05/04/2013   CLINICAL DATA:  Chest pain  radiating to the back.  EXAM: CT ANGIOGRAPHY CHEST WITH CONTRAST  TECHNIQUE: Multidetector CT imaging of the chest was performed using the standard protocol during bolus administration of intravenous contrast. Multiplanar CT image reconstructions including MIPs were obtained to evaluate the vascular anatomy.  CONTRAST:  OMNIPAQUE IOHEXOL 350 MG/ML SOLN  COMPARISON:  CTA of the chest performed 05/30/2012  FINDINGS: The thoracic aorta is dilated to 4.5 cm in maximal diameter, perhaps slightly more prominent than on the prior study. There is a new dissection flap arising from the distal aspect of the aortic arch, with a single fenestration at the proximal aspect of the dissection, and decreased enhancement of blood within the false lumen. The distal aspect of the false lumen appears to be fully clotted; the false lumen resolves just proximal to the level of the diaphragm. This causes mild luminal narrowing along the descending thoracic aorta.  The patient is status post ascending aortic repair; postoperative change is unremarkable in appearance. The great vessels are unremarkable in appearance. There is no evidence of pulmonary embolus.  Mild  left basilar atelectasis is noted. Minimal emphysematous change is noted at the lung apices. The lungs are otherwise clear. There is no evidence of pleural effusion or pneumothorax. No masses are identified.  No mediastinal lymphadenopathy is appreciated. No pericardial effusion is seen. The patient is status post median sternotomy. No axillary lymphadenopathy is seen. The thyroid gland is unremarkable in appearance.  The visualized portions of the liver and spleen are unremarkable. A 3.2 cm cyst is noted at the upper pole of the right kidney.  No acute osseous abnormalities are seen. Mild degenerative change is noted at the lower cervical spine.  Review of the MIP images confirms the above findings.  IMPRESSION: 1. New dissection flap arising from the distal aspect of the  aortic arch, extending along the descending thoracic aorta and resolving just proximal to the level of the diaphragm. No evidence of involvement of branch vessels. The distal aspect of the false lumen appears to be fully clotted, while there is decreased enhancement of blood within the more proximal false lumen, with a single fenestration noted at the proximal aspect of the dissection. Associated mild luminal narrowing along the descending thoracic aorta. The thoracic aorta measures 4.5 cm in maximal diameter, perhaps slightly more prominent than on the prior study. 2. Ascending thoracic aorta repair is unremarkable in appearance. 3. No evidence of pulmonary embolus. 4. Mild left basilar atelectasis noted; minimal emphysematous change at the lung apices. Lungs otherwise clear. 5. Right renal cyst noted.  These results were called by telephone at the time of interpretation on 05/04/2013 at 4:10 AM to Dr. Brandt Loosen , who verbally acknowledged these results.   Electronically Signed   By: Roanna Raider M.D.   On: 05/04/2013 04:27    Review of Systems  Constitutional: Positive for diaphoresis. Negative for fever, chills, weight loss and malaise/fatigue.  HENT: Negative.   Eyes: Negative.   Respiratory: Negative for shortness of breath.   Cardiovascular: Positive for chest pain. Negative for orthopnea, leg swelling and PND.  Gastrointestinal: Positive for nausea. Negative for abdominal pain, diarrhea, constipation, blood in stool and melena.  Genitourinary: Negative.   Musculoskeletal: Positive for back pain.  Skin: Negative.   Neurological: Positive for weakness. Negative for dizziness, sensory change, speech change, focal weakness, seizures and loss of consciousness.  Endo/Heme/Allergies: Negative.   Psychiatric/Behavioral: Negative.    Blood pressure 93/34, pulse 74, temperature 97.4 F (36.3 C), temperature source Oral, resp. rate 12, height 5\' 11"  (1.803 m), weight 81 kg (178 lb 9.2 oz), SpO2  100.00%. Physical Exam  Constitutional: He is oriented to person, place, and time. He appears well-developed and well-nourished. He appears distressed.  HENT:  Head: Normocephalic and atraumatic.  Mouth/Throat: Oropharynx is clear and moist.  Eyes: Conjunctivae and EOM are normal. Pupils are equal, round, and reactive to light.  Neck: Normal range of motion. Neck supple. No JVD present. No thyromegaly present.  Cardiovascular: Normal rate, regular rhythm, normal heart sounds and intact distal pulses.  Exam reveals no gallop and no friction rub.   No murmur heard. Respiratory: Effort normal and breath sounds normal. No respiratory distress. He has no rales. He exhibits no tenderness.  GI: Soft. Bowel sounds are normal. He exhibits no distension and no mass. There is no tenderness.  Musculoskeletal: He exhibits no edema and no tenderness.  Lymphadenopathy:    He has no cervical adenopathy.  Neurological: He is alert and oriented to person, place, and time. He has normal strength. No cranial nerve deficit or  sensory deficit.  Skin: Skin is warm and dry.  Psychiatric: He has a normal mood and affect.   CLINICAL DATA: Chest pain radiating to the back.  EXAM:  CT ANGIOGRAPHY CHEST WITH CONTRAST  TECHNIQUE:  Multidetector CT imaging of the chest was performed using the  standard protocol during bolus administration of intravenous  contrast. Multiplanar CT image reconstructions including MIPs were  obtained to evaluate the vascular anatomy.  CONTRAST: OMNIPAQUE IOHEXOL 350 MG/ML SOLN  COMPARISON: CTA of the chest performed 05/30/2012  FINDINGS:  The thoracic aorta is dilated to 4.5 cm in maximal diameter, perhaps  slightly more prominent than on the prior study. There is a new  dissection flap arising from the distal aspect of the aortic arch,  with a single fenestration at the proximal aspect of the dissection,  and decreased enhancement of blood within the false lumen. The  distal  aspect of the false lumen appears to be fully clotted; the  false lumen resolves just proximal to the level of the diaphragm.  This causes mild luminal narrowing along the descending thoracic  aorta.  The patient is status post ascending aortic repair; postoperative  change is unremarkable in appearance. The great vessels are  unremarkable in appearance. There is no evidence of pulmonary  embolus.  Mild left basilar atelectasis is noted. Minimal emphysematous change  is noted at the lung apices. The lungs are otherwise clear. There is  no evidence of pleural effusion or pneumothorax. No masses are  identified.  No mediastinal lymphadenopathy is appreciated. No pericardial  effusion is seen. The patient is status post median sternotomy. No  axillary lymphadenopathy is seen. The thyroid gland is unremarkable  in appearance.  The visualized portions of the liver and spleen are unremarkable. A  3.2 cm cyst is noted at the upper pole of the right kidney.  No acute osseous abnormalities are seen. Mild degenerative change is  noted at the lower cervical spine.  Review of the MIP images confirms the above findings.  IMPRESSION:  1. New dissection flap arising from the distal aspect of the aortic  arch, extending along the descending thoracic aorta and resolving  just proximal to the level of the diaphragm. No evidence of  involvement of branch vessels. The distal aspect of the false lumen  appears to be fully clotted, while there is decreased enhancement of  blood within the more proximal false lumen, with a single  fenestration noted at the proximal aspect of the dissection.  Associated mild luminal narrowing along the descending thoracic  aorta. The thoracic aorta measures 4.5 cm in maximal diameter,  perhaps slightly more prominent than on the prior study.  2. Ascending thoracic aorta repair is unremarkable in appearance.  3. No evidence of pulmonary embolus.  4. Mild left basilar  atelectasis noted; minimal emphysematous change  at the lung apices. Lungs otherwise clear.  5. Right renal cyst noted.  These results were called by telephone at the time of interpretation  on 05/04/2013 at 4:10 AM to Dr. Brandt Loosen , who verbally  acknowledged these results.  Electronically Signed  By: Roanna Raider M.D.  On: 05/04/2013 04:27        Assessment/Plan:  He has an acute type B aortic dissection that is limited to the chest with a partially thrombosed false lumen and mild aneurysmal enlargement of the descending aorta following extensive replacement of the aortic valve, ascending and arch in 2010. The immediate treatment is medical with tight BP control to  goal of SBP of 100-110. The should be done with Labetalol and Nipride with eventual conversion to po. His pain should improve and resolve with good BP control. He can have IV pain meds as needed. The mortality for surgical treatment is the same as for medical treatment for type B dissection. He does have significant coronary disease with prior left circumflex stenting and chronic RCA occlusion with what sounds like chronic stable angina. His ECG is not remarkable and troponins have been negative. He had a follow up appt with [name] tomorrow concerning his heart disease so I would let him know. His current back pain is most consistent with his dissection and not angina. I reviewed all of this with the patient and his wife who is a retired Engineer, structural. Dr. Tyrone Sage will follow up tomorrow.  BARTLE,[name] K 05/04/2013, 12:24 PM

## 2013-05-04 NOTE — Progress Notes (Signed)
D/w Dr Laneta Simmers Imaging reviewed Agree with using labetalol & nipride for BP control rather than esmolol. Keep npo x 24h -sips/ chips ok Fentanyl for pain. Decrease to 3 runs of KCL for hypokalemia.  [name] Caswell V.  [phone]

## 2013-05-05 ENCOUNTER — Ambulatory Visit: Payer: Medicare Other | Admitting: Interventional Cardiology

## 2013-05-05 ENCOUNTER — Telehealth: Payer: Self-pay | Admitting: Interventional Cardiology

## 2013-05-05 DIAGNOSIS — I2510 Atherosclerotic heart disease of native coronary artery without angina pectoris: Secondary | ICD-10-CM

## 2013-05-05 DIAGNOSIS — I7101 Dissection of thoracic aorta: Principal | ICD-10-CM

## 2013-05-05 DIAGNOSIS — I10 Essential (primary) hypertension: Secondary | ICD-10-CM

## 2013-05-05 DIAGNOSIS — Z954 Presence of other heart-valve replacement: Secondary | ICD-10-CM

## 2013-05-05 DIAGNOSIS — I700 Atherosclerosis of aorta: Secondary | ICD-10-CM

## 2013-05-05 DIAGNOSIS — R7309 Other abnormal glucose: Secondary | ICD-10-CM

## 2013-05-05 DIAGNOSIS — I7100 Dissection of unspecified site of aorta: Secondary | ICD-10-CM

## 2013-05-05 DIAGNOSIS — E876 Hypokalemia: Secondary | ICD-10-CM

## 2013-05-05 HISTORY — DX: Atherosclerosis of aorta: I70.0

## 2013-05-05 LAB — GLUCOSE, CAPILLARY
Glucose-Capillary: 108 mg/dL — ABNORMAL HIGH (ref 70–99)
Glucose-Capillary: 123 mg/dL — ABNORMAL HIGH (ref 70–99)
Glucose-Capillary: 125 mg/dL — ABNORMAL HIGH (ref 70–99)
Glucose-Capillary: 133 mg/dL — ABNORMAL HIGH (ref 70–99)
Glucose-Capillary: 120 mg/dL — ABNORMAL HIGH (ref 70–99)

## 2013-05-05 LAB — BASIC METABOLIC PANEL
BUN: 14 mg/dL (ref 6–23)
Chloride: 107 mEq/L (ref 96–112)
GFR calc Af Amer: >90 mL/min (ref >90)
GFR calc non Af Amer: >90 mL/min (ref >90)
Glucose, Bld: 122 mg/dL — ABNORMAL HIGH (ref 70–99)
Potassium: 4.1 mEq/L (ref 3.5–5.1)
Sodium: 142 mEq/L (ref 135–145)

## 2013-05-05 LAB — CBC
HCT: 32.3 % — ABNORMAL LOW (ref 39.0–52.0)
Hemoglobin: 10.3 g/dL — ABNORMAL LOW (ref 13.0–17.0)
MCHC: 31.9 g/dL (ref 30.0–36.0)
RDW: 14.2 % (ref 11.5–15.5)
WBC: 7.2 10*3/uL (ref 4.0–10.5)

## 2013-05-05 MED ORDER — ENALAPRIL MALEATE 5 MG PO TABS
5.0000 mg | ORAL_TABLET | Freq: Every day | ORAL | Status: DC
  Administered 2013-05-05 – 2013-05-12 (×8): 5 mg via ORAL
  Filled 2013-05-05 (×8): qty 1

## 2013-05-05 MED ORDER — METOPROLOL TARTRATE 50 MG PO TABS
50.0000 mg | ORAL_TABLET | Freq: Two times a day (BID) | ORAL | Status: DC
  Administered 2013-05-05 – 2013-05-11 (×13): 50 mg via ORAL
  Filled 2013-05-05 (×4): qty 1
  Filled 2013-05-05: qty 2
  Filled 2013-05-05 (×4): qty 1
  Filled 2013-05-05: qty 2
  Filled 2013-05-05 (×6): qty 1

## 2013-05-05 NOTE — Progress Notes (Signed)
PULMONARY  / CRITICAL CARE MEDICINE HISTORY AND PHYSICAL EXAMINATION  Name: [name] [name] MRN: [id number] DOB: 1940-10-18    ADMISSION DATE:  05/04/2013  CHIEF COMPLAINT:  Back and abdominal pain  BRIEF PATIENT DESCRIPTION: 72 year-old male with previous aortic dissection presenting with new type B aortic dissection. Cardiothoracic surgery requests critical care admission for blood pressure control.  SIGNIFICANT EVENTS / STUDIES:  1. CT Angiogram 11/16  > dissection flap distal aortic arch, extends to diaphragm, no PE, minimal left basilar atelectasis.   LINES / TUBES: 1. PIV  CULTURES: 1. None  ANTIBIOTICS: 1. None  Subjective:   PHYSICAL EXAM  VITAL SIGNS: Temp:  [98.1 F (36.7 C)-98.5 F (36.9 C)] 98.1 F (36.7 C) (11/17 0810) Pulse Rate:  [55-83] 66 (11/17 0815) Resp:  [7-42] 25 (11/17 0815) BP: (78-120)/(30-63) 102/40 mmHg (11/17 0815) SpO2:  [95 %-100 %] 99 % (11/17 0815) Weight:  [79.7 kg (175 lb 11.3 oz)] 79.7 kg (175 lb 11.3 oz) (11/17 0400)  HEMODYNAMICS:    VENTILATOR SETTINGS:    INTAKE / OUTPUT: Intake/Output     11/16 0701 - 11/17 0700 11/17 0701 - 11/18 0700   P.O. 90    I.V. (mL/kg) 1180.3 (14.8) 75 (0.9)   IV Piggyback 300    Total Intake(mL/kg) 1570.3 (19.7) 75 (0.9)   Urine (mL/kg/hr) 1075 (0.6) 200 (0.8)   Total Output 1075 200   Net +495.3 -125          PHYSICAL EXAMINATION:  Gen:  Comfortable HEENT: NCAT, EOMi PULM: CTA B CV :RRR , no mgr AB: BS+, soft, nontender Ext: warm, no edema Neuro: A&Ox4, maew  LABS:  CBC Recent Labs     05/04/13  0155  WBC  12.1*  HGB  12.4*  HCT  37.7*  PLT  231    Coag's Recent Labs     05/04/13  0155  APTT  26  INR  1.15    BMET Recent Labs     05/04/13  0155  NA  137  K  3.3*  CL  97  CO2  26  BUN  15  CREATININE  0.76  GLUCOSE  155*    Electrolytes Recent Labs     05/04/13  0155  CALCIUM  9.3    Sepsis Markers No results found for this basename:  LACTICACIDVEN, PROCALCITON, O2SATVEN,  in the last 72 hours  ABG No results found for this basename: PHART, PCO2ART, PO2ART,  in the last 72 hours  Liver Enzymes Recent Labs     05/04/13  0155  AST  23  ALT  12  ALKPHOS  52  BILITOT  0.4  ALBUMIN  4.2    Cardiac Enzymes Recent Labs     05/04/13  0155  PROBNP  484.8*    Glucose Recent Labs     05/04/13  1241  05/04/13  1647  05/04/13  1953  05/05/13  0036  05/05/13  0406  05/05/13  0812  GLUCAP  132*  120*  126*  126*  108*  123*    Imaging   ASSESSMENT / PLAN: Principal Problem:   Aortic dissection Active Problems:   HTN (hypertension)   Anemia   Hypokalemia   Lactic acidosis   Hyperglycemia   PULMONARY A/P: 1. No acute issue  CARDIOVASCULAR A: 1. Acute type B aortic dissection: BP better controlled, s/p arch replacement for prior ascending dissection 2. Hypertension P:  Restart ACE In  Wean labetalol off today  Call  cardiology > known patient to [name]  RENAL A: 1. Hypokalemia:  P:  bmet now  GASTROINTESTINAL A: No acute issues P: 1. Advance diet  HEMATOLOGIC A: Mild chronic anemia P:    Monitor CBC  INFECTIOUS A: No acute issues P: Monitor temp/wbc  ENDOCRINE A: Hyperglycemia P:  SSI  Check A1c  NEUROLOGIC A/P: 1. No acute issue   TODAY'S SUMMARY:   I have personally obtained a history, examined the patient, evaluated laboratory and imaging results, formulated the assessment and plan and placed orders.  CRITICAL CARE: The patient is critically ill with multiple organ systems failure and requires high complexity decision making for assessment and support, frequent evaluation and titration of therapies, application of advanced monitoring technologies and extensive interpretation of multiple databases. Critical Care Time devoted to patient care services described in this note is 40 minutes.   Yolonda Kida PCCM Pager: [phone] Cell:  [phone] If no response, call [phone]  05/05/2013, 10:10 AM

## 2013-05-05 NOTE — Progress Notes (Signed)
Patient ID: [name] [name], male   DOB: 1941/02/01, 72 y.o.   MRN: [id number] TCTS DAILY ICU PROGRESS NOTE                   [address]            [place] [address]          [phone]        Total Length of Stay:  LOS: 1 day   Subjective: Patients history reviewed, well known to me from previous admission and follow up with serial CT scans. BP now better controlled, back pain resolved.  Objective: Vital signs in last 24 hours: Temp:  [98.1 F (36.7 C)-98.5 F (36.9 C)] 98.1 F (36.7 C) (11/17 1215) Pulse Rate:  [55-83] 64 (11/17 1130) Cardiac Rhythm:  [-] Normal sinus rhythm (11/17 0800) Resp:  [10-42] 23 (11/17 1130) BP: (79-121)/(30-63) 100/31 mmHg (11/17 1130) SpO2:  [95 %-100 %] 97 % (11/17 1130) Weight:  [175 lb 11.3 oz (79.7 kg)] 175 lb 11.3 oz (79.7 kg) (11/17 0400)  Filed Weights   05/04/13 0428 05/04/13 0615 05/05/13 0400  Weight: 180 lb (81.647 kg) 178 lb 9.2 oz (81 kg) 175 lb 11.3 oz (79.7 kg)    Weight change: -16 lb 1.5 oz (-7.3 kg)   Hemodynamic parameters for last 24 hours:    Intake/Output from previous day: 11/16 0701 - 11/17 0700 In: 1570.3 [P.O.:90; I.V.:1180.3; IV Piggyback:300] Out: 1075 [Urine:1075]  Intake/Output this shift: Total I/O In: 375 [P.O.:60; I.V.:315] Out: 400 [Urine:400]  Current Meds: Scheduled Meds: . amLODipine  5 mg Oral Daily  . enalapril  5 mg Oral Daily  . insulin aspart  0-9 Units Subcutaneous Q4H  . metoCLOPramide (REGLAN) injection  10 mg Intravenous Q6H  . metoprolol tartrate  50 mg Oral BID  . pantoprazole (PROTONIX) IV  40 mg Intravenous Q24H  . simvastatin  20 mg Oral QHS   Continuous Infusions: . labetalol (NORMODYNE) infusion 2 mg/min (05/05/13 1200)  . nitroPRUSSide 0.2 mcg/kg/min (05/04/13 1600)   PRN Meds:.sodium chloride, fentaNYL, ondansetron (ZOFRAN) IV  General appearance: alert and cooperative Neurologic: intact Heart: regular rate and rhythm, S1, S2 normal, no murmur,  click, rub or gallop Lungs: clear to auscultation bilaterally Abdomen: soft, non-tender; bowel sounds normal; no masses,  no organomegaly Extremities: extremities normal, atraumatic, no cyanosis or edema and Homans sign is negative, no sign of DVT   Lab Results: CBC: Recent Labs  05/04/13 0155 05/05/13 1130  WBC 12.1* 7.2  HGB 12.4* 10.3*  HCT 37.7* 32.3*  PLT 231 148*   BMET:  Recent Labs  05/04/13 0155 05/05/13 1130  NA 137 142  K 3.3* 4.1  CL 97 107  CO2 26 29  GLUCOSE 155* 122*  BUN 15 14  CREATININE 0.76 0.69  CALCIUM 9.3 8.8    PT/INR:  Recent Labs  05/04/13 0155  LABPROT 14.5  INR 1.15   Radiology: Dg Chest Portable 1 View  05/04/2013   CLINICAL DATA:  Chest pain.  EXAM: PORTABLE CHEST - 1 VIEW  COMPARISON:  Chest radiograph performed 07/28/2010, and CTA of the chest performed 05/30/2012  FINDINGS: The lungs are well-aerated. Pulmonary vascularity is at the upper limits of normal. Mild left basilar atelectasis is noted. There is no evidence of pleural effusion or pneumothorax.  The cardiomediastinal silhouette is borderline enlarged; the patient is status post median sternotomy. No acute osseous abnormalities are seen.  IMPRESSION: Mild left basilar atelectasis noted; lungs  otherwise clear. Borderline cardiomegaly noted.   Electronically Signed   By: Roanna Raider M.D.   On: 05/04/2013 02:22   Ct Angio Chest Aortic Dissect W &/or W/o  05/04/2013   CLINICAL DATA:  Chest pain radiating to the back.  EXAM: CT ANGIOGRAPHY CHEST WITH CONTRAST  TECHNIQUE: Multidetector CT imaging of the chest was performed using the standard protocol during bolus administration of intravenous contrast. Multiplanar CT image reconstructions including MIPs were obtained to evaluate the vascular anatomy.  CONTRAST:  OMNIPAQUE IOHEXOL 350 MG/ML SOLN  COMPARISON:  CTA of the chest performed 05/30/2012  FINDINGS: The thoracic aorta is dilated to 4.5 cm in maximal diameter, perhaps slightly  more prominent than on the prior study. There is a new dissection flap arising from the distal aspect of the aortic arch, with a single fenestration at the proximal aspect of the dissection, and decreased enhancement of blood within the false lumen. The distal aspect of the false lumen appears to be fully clotted; the false lumen resolves just proximal to the level of the diaphragm. This causes mild luminal narrowing along the descending thoracic aorta.  The patient is status post ascending aortic repair; postoperative change is unremarkable in appearance. The great vessels are unremarkable in appearance. There is no evidence of pulmonary embolus.  Mild left basilar atelectasis is noted. Minimal emphysematous change is noted at the lung apices. The lungs are otherwise clear. There is no evidence of pleural effusion or pneumothorax. No masses are identified.  No mediastinal lymphadenopathy is appreciated. No pericardial effusion is seen. The patient is status post median sternotomy. No axillary lymphadenopathy is seen. The thyroid gland is unremarkable in appearance.  The visualized portions of the liver and spleen are unremarkable. A 3.2 cm cyst is noted at the upper pole of the right kidney.  No acute osseous abnormalities are seen. Mild degenerative change is noted at the lower cervical spine.  Review of the MIP images confirms the above findings.  IMPRESSION: 1. New dissection flap arising from the distal aspect of the aortic arch, extending along the descending thoracic aorta and resolving just proximal to the level of the diaphragm. No evidence of involvement of branch vessels. The distal aspect of the false lumen appears to be fully clotted, while there is decreased enhancement of blood within the more proximal false lumen, with a single fenestration noted at the proximal aspect of the dissection. Associated mild luminal narrowing along the descending thoracic aorta. The thoracic aorta measures 4.5 cm in  maximal diameter, perhaps slightly more prominent than on the prior study. 2. Ascending thoracic aorta repair is unremarkable in appearance. 3. No evidence of pulmonary embolus. 4. Mild left basilar atelectasis noted; minimal emphysematous change at the lung apices. Lungs otherwise clear. 5. Right renal cyst noted.  These results were called by telephone at the time of interpretation on 05/04/2013 at 4:10 AM to Dr. Brandt Loosen , who verbally acknowledged these results.   Electronically Signed   By: Roanna Raider M.D.   On: 05/04/2013 04:27     Assessment/Plan: S/P   avr,root, arch replacement  2010  For dilated aorta, no dissection. 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

## 2013-05-05 NOTE — Progress Notes (Signed)
Nutrition Brief Note  Patient identified on the Malnutrition Screening Tool (MST) Report for recent weight lost without trying (patient unsure).  Per weight readings, patient has had 9% weight loss since December 2013; not significant for time frame.  Wt Readings from Last 15 Encounters:  05/05/13 175 lb 11.3 oz (79.7 kg)  05/30/12 192 lb (87.091 kg)  05/18/11 167 lb (75.751 kg)    Body mass index is 24.52 kg/(m^2). Patient meets criteria for Normal based on current BMI.   Current diet order is Heart Healthy.  Labs and medications reviewed.   No nutrition interventions warranted at this time. If nutrition issues arise, please consult RD.   Maureen Chatters, RD, LDN Pager #: [phone] After-Hours Pager #: [phone]

## 2013-05-05 NOTE — Telephone Encounter (Signed)
Wife notified as well.

## 2013-05-05 NOTE — Telephone Encounter (Signed)
New Problem:  [name] wife is calling in stating her husband has a aortic anursym. [name] wife states her husband is in [facility] in room 15. She'd like it if [name] came to see her husband. Please advise

## 2013-05-05 NOTE — Telephone Encounter (Signed)
[name] notified and he will try and go by to see patient.

## 2013-05-05 NOTE — Consult Note (Signed)
Admit date: 05/04/2013 Referring Physician  Dr. 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 On 02/19/2009 he underwent replacement of his aortic valve, pericardial valve with replacement of ascending aorta and arch to the left subclavian as well as bypass to the innominate artery by Dr. Tyrone Sage in the setting of acute type A aortic dissection. Subsequently had bare-metal stent placed to the mid left circumflex in February of 2012 in the setting of chest pain and heart failure. He was noted to have occluded RCA at that time. Dr. Tyrone Sage has been following closely yearly CT scans and at last CT was in December of 2013, stable.   He reported that he was having some episodes of chest discomfort with exertion for a while that seemed to be relieved with nitroglycerin. On the night of 05/03/13 he developed sudden and severe back pain radiating through his chest. This discomfort was very similar to his prior aortic dissection so he subsequently came to the emergency room. CT angiogram was performed on 05/04/13 demonstrated dissection flap of the distal aortic arch which extends to the diaphragm. There is thrombosis of the false lumen and no sign of leakage or rupture. The aorta remains 4.5 cm about the same size as last year. No pulmonary embolism noted.  Cardiothoracic surgery team note has been reviewed.  ACE inhibitor has been restarted. He is continuing with amlodipine 5 mg once a day. Nitroprusside and labetalol IV both being administered. Blood pressure currently ranging from 78/31-141/57. His most recent blood pressure is 121/41 at 10:15 AM.  Back pain has subsided. No SOB. No bleeding.     PMH:   Past Medical History  Diagnosis Date  . Hyperlipidemia   . DVT (deep  venous thrombosis)     post knee surgery  . Tobacco abuse   . HTN (hypertension)   . Descending aortic aneurysm   . Aortic valve insufficiency   . Thoracic aneurysm   . S/P AVR (aortic valve replacement)   . CAD (coronary artery disease)     PSH:   Past Surgical History  Procedure Laterality Date  . Right groin lymphocele  [id number]  . Aortic valve replacement  [id number]  . Replacement total knee      bilateral   Allergies:  Lortab and Morphine and related Prior to Admit Meds:   Prior to Admission medications   Medication Sig Start Date End Date Taking? Authorizing Provider  amLODipine (NORVASC) 5 MG tablet Take 5 mg by mouth daily.     Yes Historical Provider, MD  aspirin EC 81 MG tablet Take 81 mg by mouth daily.     Yes Historical Provider, MD  clopidogrel (PLAVIX) 75 MG tablet Take 75 mg by mouth daily.     Yes Historical Provider, MD  enalapril (VASOTEC) 5 MG tablet Take 5 mg by mouth daily.     Yes Historical Provider, MD  furosemide (LASIX) 40 MG tablet Take 40 mg by mouth 2 (two) times daily. PRN   Yes Historical Provider, MD  lansoprazole (PREVACID) 30 MG capsule Take 30 mg by mouth daily.    Yes Historical Provider, MD  loratadine (CLARITIN) 10 MG tablet Take 10 mg by mouth daily.     Yes Historical Provider, MD  metoprolol (TOPROL-XL) 50 MG 24 hr tablet Take  50 mg by mouth 2 (two) times daily.     Yes Historical Provider, MD  nitroGLYCERIN (NITROSTAT) 0.4 MG SL tablet Place 0.4 mg under the tongue every 5 (five) minutes as needed.     Yes Historical Provider, MD  potassium chloride SA (K-DUR,KLOR-CON) 20 MEQ tablet Take 20 mEq by mouth daily.    Yes Historical Provider, MD  simvastatin (ZOCOR) 20 MG tablet Take 20 mg by mouth at bedtime.     Yes Historical Provider, MD   Fam HX:   No family history on file. noncontributory Social HX:    History   Social History  . Marital Status: Married    Spouse Name: N/A    Number of Children: N/A  . Years of Education: N/A    Occupational History  . Not on file.   Social History Main Topics  . Smoking status: Former Smoker    Types: Cigarettes    Quit date: 04/16/1974  . Smokeless tobacco: Never Used  . Alcohol Use: No  . Drug Use: No  . Sexual Activity: Not on file   Other Topics Concern  . Not on file   Social History Narrative  . No narrative on file     ROS: Denies any strokelike symptoms, fevers, chills, orthopnea, PND All 11 ROS were addressed and are negative except what is stated in the HPI   Physical Exam: Blood pressure 121/41, pulse 67, temperature 98.1 F (36.7 C), temperature source Oral, resp. rate 20, height 5\' 11"  (1.803 m), weight 175 lb 11.3 oz (79.7 kg), SpO2 100.00%.   General: Well developed, well nourished, in no acute distress Head: Eyes PERRLA, No xanthomas.   Normal cephalic and atramatic  Lungs:   Clear bilaterally to auscultation and percussion. Normal respiratory effort. No wheezes, no rales. Heart:   HRRR S1 S2 Pulses are 2+ & equal. No murmurs, rubs, gallops            No carotid bruit. No JVD.  No abdominal bruits.  Abdomen: Bowel sounds are positive, abdomen soft and non-tender without masses. No hepatosplenomegaly. Msk:  Back normal. Normal strength and tone for age. Extremities:  No clubbing, cyanosis or edema.  DP +1 Neuro: Alert and oriented X 3, non-focal, MAE x 4 GU: Deferred Rectal: Deferred Psych:  Good affect, responds appropriately      Labs: Lab Results  Component Value Date   WBC 12.1* 05/04/2013   HGB 12.4* 05/04/2013   HCT 37.7* 05/04/2013   MCV 93.1 05/04/2013   PLT 231 05/04/2013    Recent Labs Lab 05/04/13 0155  NA 137  K 3.3*  CL 97  CO2 26  BUN 15  CREATININE 0.76  CALCIUM 9.3  PROT 7.2  BILITOT 0.4  ALKPHOS 52  ALT 12  AST 23  GLUCOSE 155*   No results found for this basename: CKTOTAL, CKMB, TROPONINI,  in the last 72 hours Lab Results  Component Value Date   CHOL  Value: 136        ATP III CLASSIFICATION:  <200      mg/dL   Desirable  284-132  mg/dL   Borderline High  >=440    mg/dL   High        06/21/7251   HDL 38* 07/29/2010   LDLCALC  Value: 80        Total Cholesterol/HDL:CHD Risk Coronary Heart Disease Risk Table  Men   Women  1/2 Average Risk   3.4   3.3  Average Risk       5.0   4.4  2 X Average Risk   9.6   7.1  3 X Average Risk  23.4   11.0        Use the calculated Patient Ratio above and the CHD Risk Table to determine the patient's CHD Risk.        ATP III CLASSIFICATION (LDL):  <100     mg/dL   Optimal  161-096  mg/dL   Near or Above                    Optimal  130-159  mg/dL   Borderline  045-409  mg/dL   High  >811     mg/dL   Very High 03/02/7828   TRIG 89 07/29/2010   No results found for this basename: DDIMER     Radiology:  Dg Chest Portable 1 View  05/04/2013   CLINICAL DATA:  Chest pain.  EXAM: PORTABLE CHEST - 1 VIEW  COMPARISON:  Chest radiograph performed 07/28/2010, and CTA of the chest performed 05/30/2012  FINDINGS: The lungs are well-aerated. Pulmonary vascularity is at the upper limits of normal. Mild left basilar atelectasis is noted. There is no evidence of pleural effusion or pneumothorax.  The cardiomediastinal silhouette is borderline enlarged; the patient is status post median sternotomy. No acute osseous abnormalities are seen.  IMPRESSION: Mild left basilar atelectasis noted; lungs otherwise clear. Borderline cardiomegaly noted.   Electronically Signed   By: Roanna Raider M.D.   On: 05/04/2013 02:22   Ct Angio Chest Aortic Dissect W &/or W/o  05/04/2013   CLINICAL DATA:  Chest pain radiating to the back.  EXAM: CT ANGIOGRAPHY CHEST WITH CONTRAST  TECHNIQUE: Multidetector CT imaging of the chest was performed using the standard protocol during bolus administration of intravenous contrast. Multiplanar CT image reconstructions including MIPs were obtained to evaluate the vascular anatomy.  CONTRAST:  OMNIPAQUE IOHEXOL 350 MG/ML SOLN  COMPARISON:  CTA of  the chest performed 05/30/2012  FINDINGS: The thoracic aorta is dilated to 4.5 cm in maximal diameter, perhaps slightly more prominent than on the prior study. There is a new dissection flap arising from the distal aspect of the aortic arch, with a single fenestration at the proximal aspect of the dissection, and decreased enhancement of blood within the false lumen. The distal aspect of the false lumen appears to be fully clotted; the false lumen resolves just proximal to the level of the diaphragm. This causes mild luminal narrowing along the descending thoracic aorta.  The patient is status post ascending aortic repair; postoperative change is unremarkable in appearance. The great vessels are unremarkable in appearance. There is no evidence of pulmonary embolus.  Mild left basilar atelectasis is noted. Minimal emphysematous change is noted at the lung apices. The lungs are otherwise clear. There is no evidence of pleural effusion or pneumothorax. No masses are identified.  No mediastinal lymphadenopathy is appreciated. No pericardial effusion is seen. The patient is status post median sternotomy. No axillary lymphadenopathy is seen. The thyroid gland is unremarkable in appearance.  The visualized portions of the liver and spleen are unremarkable. A 3.2 cm cyst is noted at the upper pole of the right kidney.  No acute osseous abnormalities are seen. Mild degenerative change is noted at the lower cervical spine.  Review of the MIP images confirms the above findings.  IMPRESSION: 1. New dissection flap arising from the distal aspect of the aortic arch, extending along the descending thoracic aorta and resolving just proximal to the level of the diaphragm. No evidence of involvement of branch vessels. The distal aspect of the false lumen appears to be fully clotted, while there is decreased enhancement of blood within the more proximal false lumen, with a single fenestration noted at the proximal aspect of the  dissection. Associated mild luminal narrowing along the descending thoracic aorta. The thoracic aorta measures 4.5 cm in maximal diameter, perhaps slightly more prominent than on the prior study. 2. Ascending thoracic aorta repair is unremarkable in appearance. 3. No evidence of pulmonary embolus. 4. Mild left basilar atelectasis noted; minimal emphysematous change at the lung apices. Lungs otherwise clear. 5. Right renal cyst noted.  These results were called by telephone at the time of interpretation on 05/04/2013 at 4:10 AM to Dr. Brandt Loosen , who verbally acknowledged these results.   Electronically Signed   By: Roanna Raider M.D.   On: 05/04/2013 04:27   Personally viewed.  EKG:   Sinus rhythm, nonspecific ST-T wave changes with subtle depression diffusely. Personally viewed.   ASSESSMENT/PLAN:   72 year old with previously repaired ascending aortic aneurysm with paracardial tissue aortic valve who presented with acuteb aortic dissection from subclavian down to diaphragm.  1. Hypertension-agree with current plans. Reinitiation of ACE inhibitor. Will reinitiate Toprol/beta blocker as well. Weaning off both labetalol and as well as nitroprusside (nitroprusside now off). Restarting metoprolol 50mg  BID.   2. Type B aortic dissection-cardiothoracic surgery team consult note reviewed. Acute treatment includes tight blood pressure control trying to maintain systolic blood pressure between 100- 110 mmHg. The mortality for surgical treatment is the same as medical treatment for type B dissection. Will DC DVT proph dose heparin SQ and place SCD's. Discussed with Dr. Tyrone Sage. Holding Plavix/ASA (it has been greater than one year since stent placement to circ).   3. Coronary artery disease-chronically occluded RCA, previously stented circumflex. His current back pain is consistent with dissection, not chronic stable angina.  Discussed with wife and daughter.   Donato Schultz, MD  05/05/2013  11:17  AM

## 2013-05-06 DIAGNOSIS — I4891 Unspecified atrial fibrillation: Secondary | ICD-10-CM | POA: Diagnosis not present

## 2013-05-06 DIAGNOSIS — I7101 Dissection of thoracic aorta: Secondary | ICD-10-CM

## 2013-05-06 LAB — CBC
HCT: 31.5 % — ABNORMAL LOW (ref 39.0–52.0)
MCH: 30.5 pg (ref 26.0–34.0)
MCHC: 32.1 g/dL (ref 30.0–36.0)
Platelets: 144 10*3/uL — ABNORMAL LOW (ref 150–400)
RDW: 14.2 % (ref 11.5–15.5)
WBC: 6.8 10*3/uL (ref 4.0–10.5)

## 2013-05-06 LAB — GLUCOSE, CAPILLARY: Glucose-Capillary: 107 mg/dL — ABNORMAL HIGH (ref 70–99)

## 2013-05-06 LAB — BASIC METABOLIC PANEL
BUN: 15 mg/dL (ref 6–23)
Chloride: 104 mEq/L (ref 96–112)
Creatinine, Ser: 0.65 mg/dL (ref 0.50–1.35)
GFR calc Af Amer: >90 mL/min (ref >90)
Potassium: 3.8 mEq/L (ref 3.5–5.1)
Sodium: 138 mEq/L (ref 135–145)

## 2013-05-06 MED ORDER — AMIODARONE HCL IN DEXTROSE 360-4.14 MG/200ML-% IV SOLN
60.0000 mg/h | INTRAVENOUS | Status: AC
  Administered 2013-05-06 (×2): 60 mg/h via INTRAVENOUS
  Filled 2013-05-06 (×2): qty 200

## 2013-05-06 MED ORDER — AMIODARONE HCL IN DEXTROSE 360-4.14 MG/200ML-% IV SOLN
30.0000 mg/h | INTRAVENOUS | Status: DC
  Administered 2013-05-07 – 2013-05-09 (×6): 30 mg/h via INTRAVENOUS
  Filled 2013-05-06 (×11): qty 200

## 2013-05-06 MED ORDER — AMIODARONE LOAD VIA INFUSION
150.0000 mg | Freq: Once | INTRAVENOUS | Status: AC
  Administered 2013-05-06: 150 mg via INTRAVENOUS
  Filled 2013-05-06: qty 83.34

## 2013-05-06 MED ORDER — AMLODIPINE BESYLATE 10 MG PO TABS
10.0000 mg | ORAL_TABLET | Freq: Every day | ORAL | Status: DC
  Administered 2013-05-06 – 2013-05-07 (×2): 10 mg via ORAL
  Filled 2013-05-06 (×2): qty 1

## 2013-05-06 MED ORDER — SODIUM CHLORIDE 0.9 % IJ SOLN
3.0000 mL | Freq: Two times a day (BID) | INTRAMUSCULAR | Status: DC
  Administered 2013-05-06 – 2013-05-12 (×8): 3 mL via INTRAVENOUS
  Administered 2013-05-12 – 2013-05-13 (×2): via INTRAVENOUS

## 2013-05-06 NOTE — Evaluation (Signed)
Physical Therapy Evaluation Patient Details Name: [name] [name] MRN: [id number] DOB: [birth year] Today's Date: 05/06/2013 Time: 8295-6213 PT Time Calculation (min): 20 min  PT Assessment / Plan / Recommendation History of Present Illness  72 year-old male with previous aortic dissection presenting with new type B aortic dissection. Stable now on medical therapy.  Clinical Impression  Pt admitted with above. Pt currently with functional limitations due to the deficits listed below (see PT Problem List).  Pt will benefit from skilled PT to increase their independence and safety with mobility to allow discharge home with supportive family.     PT Assessment  Patient needs continued PT services    Follow Up Recommendations  Home health PT;Supervision/Assistance - 24 hour    Does the patient have the potential to tolerate intense rehabilitation      Barriers to Discharge        Equipment Recommendations  None recommended by PT    Recommendations for Other Services     Frequency Min 3X/week    Precautions / Restrictions Precautions Precautions: Fall   Pertinent Vitals/Pain BP 68/34 with standing.      Mobility  Transfers Transfers: Sit to Stand;Stand to Sit Sit to Stand: 4: Min assist;With upper extremity assist;With armrests;From chair/3-in-1 Stand to Sit: 4: Min assist;With upper extremity assist;With armrests;To chair/3-in-1 Details for Transfer Assistance: Verbal cues for hand placement and assist to bring hips up. Ambulation/Gait Ambulation/Gait Assistance: 4: Min assist (+2 for lines) Ambulation Distance (Feet): 6 Feet (forward and backward) Assistive device: Other (Comment) (pushing w/c) Ambulation/Gait Assistance Details: Verbal cues to stand more erect. Gait Pattern: Step-through pattern;Decreased step length - right;Decreased step length - left;Shuffle;Trunk flexed Gait velocity: slow    Exercises     PT Diagnosis: Difficulty walking;Generalized weakness   PT Problem List: Decreased strength;Decreased activity tolerance;Decreased balance;Decreased mobility PT Treatment Interventions: DME instruction;Gait training;Stair training;Functional mobility training;Therapeutic activities;Therapeutic exercise;Balance training;Patient/family education     PT Goals(Current goals can be found in the care plan section) Acute Rehab PT Goals Patient Stated Goal: Return home PT Goal Formulation: With patient/family Time For Goal Achievement: 05/13/13 Potential to Achieve Goals: Good  Visit Information  Last PT Received On: 05/06/13 Assistance Needed: +1 History of Present Illness: 72 year-old male with previous aortic dissection presenting with new type B aortic dissection. Stable now on medical therapy.       Prior Functioning  Home Living Family/patient expects to be discharged to:: Private residence Living Arrangements: Spouse/significant other Available Help at Discharge: Family;Available 24 hours/day Type of Home: Mobile home Home Access: Stairs to enter Entrance Stairs-Number of Steps: 4 Entrance Stairs-Rails: Right Home Layout: One level Home Equipment: Walker - 2 wheels Prior Function Level of Independence: Independent Communication Communication: No difficulties    Cognition  Cognition Arousal/Alertness: Awake/alert Behavior During Therapy: WFL for tasks assessed/performed Overall Cognitive Status: Within Functional Limits for tasks assessed    Extremity/Trunk Assessment Upper Extremity Assessment Upper Extremity Assessment: Generalized weakness Lower Extremity Assessment Lower Extremity Assessment: Generalized weakness   Balance Balance Balance Assessed: Yes Static Standing Balance Static Standing - Balance Support: Bilateral upper extremity supported Static Standing - Level of Assistance: 4: Min assist  End of Session PT - End of Session Equipment Utilized During Treatment: Gait belt Activity Tolerance: Patient limited by  fatigue Patient left: in chair;with call bell/phone within reach Nurse Communication: Mobility status  GP     Ladesha Pacini 05/06/2013, 2:15 PM  [employer] PT [phone]

## 2013-05-06 NOTE — Progress Notes (Signed)
05/06/13 1630  Vitals  BP ! 92/58 mmHg  MAP (mmHg) 66  Pulse Rate ! 142  ECG Heart Rate ! 138  Resp ! 28  Oxygen Therapy  SpO2 94 %  Called [facility] and notified Dr. Marchelle Gearing of patient's change in rhythm to rapid atrial fibrillation at 1602. Rate ranging from 110s- 140's. Dr. Marchelle Gearing stated he would notify cardiology and cancelled transfer to SDU.

## 2013-05-06 NOTE — Progress Notes (Signed)
05/06/13 1800  Vitals  BP ! 98/55 mmHg  MAP (mmHg) 64  Pulse Rate ! 138  ECG Heart Rate ! 120  Resp 15  Oxygen Therapy  SpO2 96 %  Pain Assessment  Pain Score 0  Amiodarone 150 mg IV bolus given and drip started at 60 mg/hr.

## 2013-05-06 NOTE — Progress Notes (Signed)
PULMONARY  / CRITICAL CARE MEDICINE HISTORY AND PHYSICAL EXAMINATION  Name: [name] [name] MRN: [id number] DOB: 1940-07-28    ADMISSION DATE:  05/04/2013  CHIEF COMPLAINT:  Back and abdominal pain  BRIEF PATIENT DESCRIPTION: 72 year-old male with previous aortic dissection presenting with new type B aortic dissection. Cardiothoracic surgery requests critical care admission for blood pressure control.  SIGNIFICANT EVENTS / STUDIES:  CT Angiogram 11/16  > dissection flap distal aortic arch, extends to diaphragm, no PE, minimal left basilar atelectasis.   LINES / TUBES: PIV  CULTURES: None  ANTIBIOTICS: None   Subjective:  Wants to get out of bed  PHYSICAL EXAM  VITAL SIGNS: Temp:  [98.1 F (36.7 C)-98.9 F (37.2 C)] 98.9 F (37.2 C) (11/18 0400) Pulse Rate:  [63-78] 67 (11/18 0645) Resp:  [7-46] 27 (11/18 0645) BP: (90-137)/(31-99) 110/46 mmHg (11/18 0645) SpO2:  [93 %-100 %] 97 % (11/18 0645) Weight:  [82 kg (180 lb 12.4 oz)] 82 kg (180 lb 12.4 oz) (11/18 0500)  HEMODYNAMICS:    VENTILATOR SETTINGS:    INTAKE / OUTPUT: Intake/Output     11/17 0701 - 11/18 0700 11/18 0701 - 11/19 0700   P.O. 480    I.V. (mL/kg) 998.8 (12.2)    IV Piggyback     Total Intake(mL/kg) 1478.8 (18)    Urine (mL/kg/hr) 975 (0.5)    Total Output 975     Net +503.8            PHYSICAL EXAMINATION:  Gen:  Comfortable HEENT: NCAT, EOMi PULM: CTA B CV :RRR , no mgr AB: BS+, soft, nontender Ext: warm, no edema Neuro: A&Ox4, maew  LABS:  CBC Recent Labs     05/04/13  0155  05/05/13  1130  05/06/13  0355  WBC  12.1*  7.2  6.8  HGB  12.4*  10.3*  10.1*  HCT  37.7*  32.3*  31.5*  PLT  231  148*  144*    Coag's Recent Labs     05/04/13  0155  APTT  26  INR  1.15    BMET Recent Labs     05/04/13  0155  05/05/13  1130  05/06/13  0355  NA  137  142  138  K  3.3*  4.1  3.8  CL  97  107  104  CO2  26  29  24   BUN  15  14  15   CREATININE  0.76  0.69  0.65   GLUCOSE  155*  122*  105*    Electrolytes Recent Labs     05/04/13  0155  05/05/13  1130  05/06/13  0355  CALCIUM  9.3  8.8  8.6    Sepsis Markers No results found for this basename: LACTICACIDVEN, PROCALCITON, O2SATVEN,  in the last 72 hours  ABG No results found for this basename: PHART, PCO2ART, PO2ART,  in the last 72 hours  Liver Enzymes Recent Labs     05/04/13  0155  AST  23  ALT  12  ALKPHOS  52  BILITOT  0.4  ALBUMIN  4.2    Cardiac Enzymes Recent Labs     05/04/13  0155  PROBNP  484.8*    Glucose Recent Labs     05/05/13  0812  05/05/13  1216  05/05/13  1555  05/05/13  1939  05/05/13  2332  05/06/13  0452  GLUCAP  123*  125*  133*  120*  107*  107*  Imaging   ASSESSMENT / PLAN: Principal Problem:   Aortic dissection Active Problems:   HTN (hypertension)   Anemia   Hypokalemia   Lactic acidosis   Hyperglycemia   CAD (coronary artery disease)   S/P AVR (aortic valve replacement)   Aortic atherosclerosis   PULMONARY A/P: No acute issue  CARDIOVASCULAR A: Acute type B aortic dissection: s/p arch replacement for prior ascending dissection Hypertension: BP better controlled, still on labetalol gtt;  P: Restart ACE In Wean labetalol off today F/u cardiology and CTCS recs Increase amlodipine 10mg   RENAL A: Hypokalemia:  P: bmet now  GASTROINTESTINAL A: No acute issues P: Advance diet  HEMATOLOGIC A: Mild chronic anemia P:    Monitor CBC  INFECTIOUS A: No acute issues P: Monitor temp/wbc  ENDOCRINE A: No acute issues P: D/c SSI   NEUROLOGIC A/P: 1. No acute issue   TODAY'S SUMMARY: Out of bed if OK by surgery, wean off labetalol; Out of bed and to SDU when OK by Tretha Sciara PCCM Pager: [phone] Cell: [phone] If no response, call [phone]  05/06/2013, 8:13 AM

## 2013-05-06 NOTE — Progress Notes (Signed)
HR 123 BP 90 sbp  On multiple BP meds  Plan Called cards ? Needs cardiovert   Dr. Kalman Shan, M.D., [facility].C.P Pulmonary and Critical Care Medicine Staff Physician [facility] Pager: [phone], If no answer or between  15:00h - 7:00h: call [phone]  05/06/2013 4:47 PM

## 2013-05-06 NOTE — Progress Notes (Signed)
Patient Name: [name] [name] Date of Encounter: 05/06/2013  Principal Problem:   Aortic dissection Active Problems:   HTN (hypertension)   Anemia   Hypokalemia   Lactic acidosis   Hyperglycemia   CAD (coronary artery disease)   S/P AVR (aortic valve replacement)   Aortic atherosclerosis   Atrial fibrillation with rapid ventricular response    SUBJECTIVE: No chest pain or SOB, did not feel any different when he went into afib.   OBJECTIVE Filed Vitals:   05/06/13 1545 05/06/13 1600 05/06/13 1615 05/06/13 1653  BP: 121/37 116/41 103/50   Pulse: 65 64 131   Temp:    98.4 F (36.9 C)  TempSrc:    Oral  Resp: 20 15 25    Height:      Weight:      SpO2: 96% 95% 94%     Intake/Output Summary (Last 24 hours) at 05/06/13 1733 Last data filed at 05/06/13 1500  Gross per 24 hour  Intake   1305 ml  Output    475 ml  Net    830 ml   Filed Weights   05/04/13 0615 05/05/13 0400 05/06/13 0500  Weight: 178 lb 9.2 oz (81 kg) 175 lb 11.3 oz (79.7 kg) 180 lb 12.4 oz (82 kg)    PHYSICAL EXAM General: Well developed, well nourished, male in no acute distress. Head: Normocephalic, atraumatic.  Neck: Supple without bruits, JVD slightly elevated Lungs:  Resp regular and unlabored, CTA. Heart: rapid and slightly irregular, S1, S2, no S3, S4, may have murmur; no rub. Abdomen: Soft, non-tender, non-distended, BS + x 4.  Extremities: No clubbing, cyanosis, no edema.  Neuro: Alert and oriented X 3. Moves all extremities spontaneously. Psych: Normal affect.  LABS: CBC:  Recent Labs  05/04/13 0155 05/05/13 1130 05/06/13 0355  WBC 12.1* 7.2 6.8  NEUTROABS 10.3*  --   --   HGB 12.4* 10.3* 10.1*  HCT 37.7* 32.3* 31.5*  MCV 93.1 95.8 95.2  PLT 231 148* 144*   INR:  Recent Labs  05/04/13 0155  INR 1.15   Basic Metabolic Panel:  Recent Labs  46/96/29 1130 05/06/13 0355  NA 142 138  K 4.1 3.8  CL 107 104  CO2 29 24  GLUCOSE 122* 105*  BUN 14 15    CREATININE 0.69 0.65  CALCIUM 8.8 8.6   Liver Function Tests:  Recent Labs  05/04/13 0155  AST 23  ALT 12  ALKPHOS 52  BILITOT 0.4  PROT 7.2  ALBUMIN 4.2    Recent Labs  05/04/13 0200 05/04/13 0444  TROPIPOC 0.00 0.01   BNP: Pro B Natriuretic peptide (BNP)  Date/Time Value Range Status  05/04/2013  1:55 AM 484.8* 0 - 125 pg/mL Final  07/28/2010  4:55 PM 151.0* 0.0 - 100.0 pg/mL Final   TELE:  SR, till about 4 pm, then afib w/ RVR  Radiology/Studies: Dg Chest Portable 1 View 05/04/2013   CLINICAL DATA:  Chest pain.  EXAM: PORTABLE CHEST - 1 VIEW  COMPARISON:  Chest radiograph performed 07/28/2010, and CTA of the chest performed 05/30/2012  FINDINGS: The lungs are well-aerated. Pulmonary vascularity is at the upper limits of normal. Mild left basilar atelectasis is noted. There is no evidence of pleural effusion or pneumothorax.  The cardiomediastinal silhouette is borderline enlarged; the patient is status post median sternotomy. No acute osseous abnormalities are seen.  IMPRESSION: Mild left basilar atelectasis noted; lungs otherwise clear. Borderline cardiomegaly noted.   Electronically Signed  By: Roanna Raider M.D.   On: 05/04/2013 02:22   Ct Angio Chest Aortic Dissect W &/or W/o 05/04/2013   CLINICAL DATA:  Chest pain radiating to the back.  EXAM: CT ANGIOGRAPHY CHEST WITH CONTRAST  TECHNIQUE: Multidetector CT imaging of the chest was performed using the standard protocol during bolus administration of intravenous contrast. Multiplanar CT image reconstructions including MIPs were obtained to evaluate the vascular anatomy.  CONTRAST:  OMNIPAQUE IOHEXOL 350 MG/ML SOLN  COMPARISON:  CTA of the chest performed 05/30/2012  FINDINGS: The thoracic aorta is dilated to 4.5 cm in maximal diameter, perhaps slightly more prominent than on the prior study. There is a new dissection flap arising from the distal aspect of the aortic arch, with a single fenestration at the proximal  aspect of the dissection, and decreased enhancement of blood within the false lumen. The distal aspect of the false lumen appears to be fully clotted; the false lumen resolves just proximal to the level of the diaphragm. This causes mild luminal narrowing along the descending thoracic aorta.  The patient is status post ascending aortic repair; postoperative change is unremarkable in appearance. The great vessels are unremarkable in appearance. There is no evidence of pulmonary embolus.  Mild left basilar atelectasis is noted. Minimal emphysematous change is noted at the lung apices. The lungs are otherwise clear. There is no evidence of pleural effusion or pneumothorax. No masses are identified.  No mediastinal lymphadenopathy is appreciated. No pericardial effusion is seen. The patient is status post median sternotomy. No axillary lymphadenopathy is seen. The thyroid gland is unremarkable in appearance.  The visualized portions of the liver and spleen are unremarkable. A 3.2 cm cyst is noted at the upper pole of the right kidney.  No acute osseous abnormalities are seen. Mild degenerative change is noted at the lower cervical spine.  Review of the MIP images confirms the above findings.  11/1 IMPRESSION: 1. New dissection flap arising from the distal aspect of the aortic arch, extending along the descending thoracic aorta and resolving just proximal to the level of the diaphragm. No evidence of involvement of branch vessels. The distal aspect of the false lumen appears to be fully clotted, while there is decreased enhancement of blood within the more proximal false lumen, with a single fenestration noted at the proximal aspect of the dissection. Associated mild luminal narrowing along the descending thoracic aorta. The thoracic aorta measures 4.5 cm in maximal diameter, perhaps slightly more prominent than on the prior study. 2. Ascending thoracic aorta repair is unremarkable in appearance. 3. No evidence of  pulmonary embolus. 4. Mild left basilar atelectasis noted; minimal emphysematous change at the lung apices. Lungs otherwise clear. 5. Right renal cyst noted.  These results were called by telephone at the time of interpretation on 05/04/2013 at 4:10 AM to Dr. Brandt Loosen , who verbally acknowledged these results.   Electronically Signed   By: Roanna Raider M.D.   On: 05/04/2013 04:27    Current Medications:  . amLODipine  10 mg Oral Daily  . enalapril  5 mg Oral Daily  . metoprolol tartrate  50 mg Oral BID  . simvastatin  20 mg Oral QHS  . sodium chloride  3 mL Intravenous Q12H   . labetalol (NORMODYNE) infusion Stopped (05/06/13 0910)    ASSESSMENT AND PLAN: 72 yo male with Hx CAD (BMS CFX 2012, RCA totaled) and AOVR (bioprosthetic AoV in 2010) who was admitted 11/16 with back and abdominal pain. 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 Admitted for BP control. TCTS has seen, no surgery planned.     Atrial fibrillation with rapid ventricular response - spoke with [name], add amiodarone and follow. He may convert spontaneously. If not, can consider DCCV in am as documented SR till this pm. May benefit from partial amio load prior to DCCV as long as symptoms/BP are controlled.  Principal Problem:   Aortic dissection - no surgery planned, BP control improved Active Problems:   HTN (hypertension) - BP improved on current Rx.   Anemia - per primary MD   Hypokalemia - improved   Lactic acidosis - per primary MD   Hyperglycemia - per primary MD   CAD (coronary artery disease) - treat symptoms   S/P AVR (aortic valve replacement) - TCTS has seen   Aortic atherosclerosis - follow    Signed, Theodore Demark , PA-C 5:33 PM 05/06/2013 I have examined the patient and reviewed assessment and plan and discussed with patient.  Agree with above as stated.  BP control for Type B dissection.  Now complicated by AFib with RVR.  He is not a candidate for anticoagulation at this time.  Will try IV amiodarone  to restore NSR.  AFib has just started, definitely less than 48 hours duration.  Not much room on BP for additional rate control meds.  Will follow.  VARANASI,JAYADEEP S.

## 2013-05-06 NOTE — Plan of Care (Signed)
Problem: Phase I Progression Outcomes Goal: Hemodynamically stable Outcome: Progressing Off IV labetolol. On amlodipine, enalapril and metoprolol po to sustain sbp < 110.  Problem: Phase II Progression Outcomes Goal: Progress activity as tolerated unless otherwise ordered Outcome: Progressing PT evaluated and ambulated to door. Up to chair for first time today. Tolerated fair.

## 2013-05-06 NOTE — Progress Notes (Signed)
05/06/13 1730  Vitals  BP ! 109/58 mmHg  MAP (mmHg) 68  Pulse Rate ! 130  ECG Heart Rate ! 137  Resp 18  Oxygen Therapy  SpO2 96 %  Theodore Demark, NP in to see patient for cardiology. Will write orders for amiodarone.

## 2013-05-06 NOTE — Progress Notes (Signed)
Patient ID: [name] [name], male   DOB: [birth year], 72 y.o.   MRN: [id number] TCTS DAILY ICU PROGRESS NOTE                   [address]            [place] [address]          [phone]        Total Length of Stay:  LOS: 2 days   Subjective: Stable night, still on labetalol   Objective: Vital signs in last 24 hours: Temp:  [98.1 F (36.7 C)-98.9 F (37.2 C)] 98.6 F (37 C) (11/18 0814) Pulse Rate:  [63-78] 67 (11/18 0900) Cardiac Rhythm:  [-] Normal sinus rhythm (11/18 0600) Resp:  [7-46] 22 (11/18 0900) BP: (90-137)/(31-99) 105/41 mmHg (11/18 0900) SpO2:  [93 %-100 %] 94 % (11/18 0900) Weight:  [180 lb 12.4 oz (82 kg)] 180 lb 12.4 oz (82 kg) (11/18 0500)  Filed Weights   05/04/13 0615 05/05/13 0400 05/06/13 0500  Weight: 178 lb 9.2 oz (81 kg) 175 lb 11.3 oz (79.7 kg) 180 lb 12.4 oz (82 kg)    Weight change: 5 lb 1.1 oz (2.3 kg)   Hemodynamic parameters for last 24 hours:    Intake/Output from previous day: 11/17 0701 - 11/18 0700 In: 1478.8 [P.O.:480; I.V.:998.8] Out: 975 [Urine:975]  Intake/Output this shift:    Current Meds: Scheduled Meds: . amLODipine  10 mg Oral Daily  . enalapril  5 mg Oral Daily  . metoprolol tartrate  50 mg Oral BID  . simvastatin  20 mg Oral QHS   Continuous Infusions: . labetalol (NORMODYNE) infusion 2.5 mg/min (05/06/13 0656)   PRN Meds:.sodium chloride, fentaNYL, ondansetron (ZOFRAN) IV  General appearance: alert and cooperative Neurologic: intact Heart: regular rate and rhythm, S1, S2 normal, no murmur, click, rub or gallop Lungs: clear to auscultation bilaterally Abdomen: soft, non-tender; bowel sounds normal; no masses,  no organomegaly Extremities: extremities normal, atraumatic, no cyanosis or edema and Homans sign is negative, no sign of DVT   Lab Results: CBC: Recent Labs  05/05/13 1130 05/06/13 0355  WBC 7.2 6.8  HGB 10.3* 10.1*  HCT 32.3* 31.5*  PLT 148* 144*   BMET:  Recent  Labs  05/05/13 1130 05/06/13 0355  NA 142 138  K 4.1 3.8  CL 107 104  CO2 29 24  GLUCOSE 122* 105*  BUN 14 15  CREATININE 0.69 0.65  CALCIUM 8.8 8.6    PT/INR:  Recent Labs  05/04/13 0155  LABPROT 14.5  INR 1.15   Radiology: No results found.   Assessment/Plan: S/P   Type III Aortic dissection, medical treatment- renal function and HGB stable OK to get up and move around  Cal Gindlesperger B 05/06/2013 9:42 AM

## 2013-05-07 MED ORDER — DILTIAZEM HCL ER COATED BEADS 120 MG PO CP24
120.0000 mg | ORAL_CAPSULE | Freq: Every day | ORAL | Status: DC
  Filled 2013-05-07: qty 1

## 2013-05-07 NOTE — Progress Notes (Signed)
SUBJECTIVE:  No CP; no palpitations. AFib started yesterday.  IV AMio started as well.  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  OBJECTIVE:   Vitals:   Filed Vitals:   05/07/13 0732 05/07/13 0800 05/07/13 0900 05/07/13 1000  BP:  110/68 122/68 96/43  Pulse:  135 121 128  Temp: 99.2 F (37.3 C)     TempSrc: Oral     Resp:  28 21 27   Height:      Weight:      SpO2:  97% 96% 96%   I&O's:   Intake/Output Summary (Last 24 hours) at 05/07/13 1021 Last data filed at 05/07/13 1000  Gross per 24 hour  Intake 1758.5 ml  Output    875 ml  Net  883.5 ml   TELEMETRY: Reviewed telemetry pt in AFib with RVR:     PHYSICAL EXAM General: Well developed, well nourished, in no acute distress Head: Eyes PERRLA, No xanthomas.   Normal cephalic and atramatic  Lungs:   No wheezing Heart:  Irregularly irregular Abdomen: nondistended Msk:   Normal strength and tone for age. Extremities:  No edema.   Neuro: Alert and oriented X 3. Psych:  Normal affect, responds appropriately   LABS: Basic Metabolic Panel:  Recent Labs  57/84/69 1130 05/06/13 0355  NA 142 138  K 4.1 3.8  CL 107 104  CO2 29 24  GLUCOSE 122* 105*  BUN 14 15  CREATININE 0.69 0.65  CALCIUM 8.8 8.6   Liver Function Tests: No results found for this basename: AST, ALT, ALKPHOS, BILITOT, PROT, ALBUMIN,  in the last 72 hours No results found for this basename: LIPASE, AMYLASE,  in the last 72 hours CBC:  Recent Labs  05/05/13 1130 05/06/13 0355  WBC 7.2 6.8  HGB 10.3* 10.1*  HCT 32.3* 31.5*  MCV 95.8 95.2  PLT 148* 144*   Cardiac Enzymes: No results found for this basename: CKTOTAL, CKMB, CKMBINDEX, TROPONINI,  in the last 72 hours BNP: No components found with this basename: POCBNP,  D-Dimer: No results found for this basename: DDIMER,  in the last 72 hours Hemoglobin A1C: No results found for this basename: HGBA1C,  in the last 72 hours Fasting Lipid Panel: No results found for this basename:  CHOL, HDL, LDLCALC, TRIG, CHOLHDL, LDLDIRECT,  in the last 72 hours Thyroid Function Tests: No results found for this basename: TSH, T4TOTAL, FREET3, T3FREE, THYROIDAB,  in the last 72 hours Anemia Panel: No results found for this basename: VITAMINB12, FOLATE, FERRITIN, TIBC, IRON, RETICCTPCT,  in the last 72 hours Coag Panel:   Lab Results  Component Value Date   INR 1.15 05/04/2013   INR 1.03 07/28/2010   INR 1.11 04/13/2009    RADIOLOGY: Dg Chest Portable 1 View  05/04/2013   CLINICAL DATA:  Chest pain.  EXAM: PORTABLE CHEST - 1 VIEW  COMPARISON:  Chest radiograph performed 07/28/2010, and CTA of the chest performed 05/30/2012  FINDINGS: The lungs are well-aerated. Pulmonary vascularity is at the upper limits of normal. Mild left basilar atelectasis is noted. There is no evidence of pleural effusion or pneumothorax.  The cardiomediastinal silhouette is borderline enlarged; the patient is status post median sternotomy. No acute osseous abnormalities are seen.  IMPRESSION: Mild left basilar atelectasis noted; lungs otherwise clear. Borderline cardiomegaly noted.   Electronically Signed   By: Roanna Raider M.D.   On: 05/04/2013 02:22   Ct Angio Chest Aortic Dissect W &/or W/o  05/04/2013   CLINICAL DATA:  Chest pain radiating to the back.  EXAM: CT ANGIOGRAPHY CHEST WITH CONTRAST  TECHNIQUE: Multidetector CT imaging of the chest was performed using the standard protocol during bolus administration of intravenous contrast. Multiplanar CT image reconstructions including MIPs were obtained to evaluate the vascular anatomy.  CONTRAST:  OMNIPAQUE IOHEXOL 350 MG/ML SOLN  COMPARISON:  CTA of the chest performed 05/30/2012  FINDINGS: The thoracic aorta is dilated to 4.5 cm in maximal diameter, perhaps slightly more prominent than on the prior study. There is a new dissection flap arising from the distal aspect of the aortic arch, with a single fenestration at the proximal aspect of the dissection,  and decreased enhancement of blood within the false lumen. The distal aspect of the false lumen appears to be fully clotted; the false lumen resolves just proximal to the level of the diaphragm. This causes mild luminal narrowing along the descending thoracic aorta.  The patient is status post ascending aortic repair; postoperative change is unremarkable in appearance. The great vessels are unremarkable in appearance. There is no evidence of pulmonary embolus.  Mild left basilar atelectasis is noted. Minimal emphysematous change is noted at the lung apices. The lungs are otherwise clear. There is no evidence of pleural effusion or pneumothorax. No masses are identified.  No mediastinal lymphadenopathy is appreciated. No pericardial effusion is seen. The patient is status post median sternotomy. No axillary lymphadenopathy is seen. The thyroid gland is unremarkable in appearance.  The visualized portions of the liver and spleen are unremarkable. A 3.2 cm cyst is noted at the upper pole of the right kidney.  No acute osseous abnormalities are seen. Mild degenerative change is noted at the lower cervical spine.  Review of the MIP images confirms the above findings.  IMPRESSION: 1. New dissection flap arising from the distal aspect of the aortic arch, extending along the descending thoracic aorta and resolving just proximal to the level of the diaphragm. No evidence of involvement of branch vessels. The distal aspect of the false lumen appears to be fully clotted, while there is decreased enhancement of blood within the more proximal false lumen, with a single fenestration noted at the proximal aspect of the dissection. Associated mild luminal narrowing along the descending thoracic aorta. The thoracic aorta measures 4.5 cm in maximal diameter, perhaps slightly more prominent than on the prior study. 2. Ascending thoracic aorta repair is unremarkable in appearance. 3. No evidence of pulmonary embolus. 4. Mild left  basilar atelectasis noted; minimal emphysematous change at the lung apices. Lungs otherwise clear. 5. Right renal cyst noted.  These results were called by telephone at the time of interpretation on 05/04/2013 at 4:10 AM to Dr. Brandt Loosen , who verbally acknowledged these results.   Electronically Signed   By: Roanna Raider M.D.   On: 05/04/2013 04:27      ASSESSMENT: Aortic dissection, AFib, CAD  PLAN:  Continued BP control.  Now off of IV drips for BP lowering.  Contine oral meds.  Still in AFib: would have to consider anticoagulation if safe from a surgical standpoint.  COntinue IV Amiodarone.  Hopefully, he will convert.  Could consider cardioversion but he would still need anticoagulation for a month afterwards.  If bleeding risk outweighs benefit of stroke reduction, would just continue rate control.   No angina.  Negative troponin.  [name]., MD  05/07/2013  10:21 AM

## 2013-05-07 NOTE — Progress Notes (Signed)
[facility] Progress Note [place] TEAM 1 - Stepdown/ICU TEAM   DANIEL RITTHALER KGM:[id number] DOB: 1940/10/19 DOA: 05/04/2013 PCP: [name]., MD  Admit HPI / Brief Narrative: 72 year old male with prior aortic valve replacement who presented to [facility] with abdominal pain and back pain. The pain began spontaneously while the patient was watching football. It was severe and tearing in nature. It was reminiscent of previous pain he had when he was first found to have an aortic aneurysm many years ago. There was no associated fevers, chills, shortness of breath, nausea, or vomiting. He did feel a little lightheaded when the pain began.  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 - has been tx w/ labetalol gtt - no chest pain today - appears to be stabilizing   Atrial fibrillation w/ RVR - newly diagnosed 05/06/13 not a candidate for anticoagulation at this time - IV Amiodarone per Cardiology  HTN  Well controlled at present/borderline hypotension - follow   Mild chronic anemia  Hgb is stable  Hypokalemia  Resolved   Lactic acidosis  Level not rechecked, but clinically suspect has resolved   Hyperglycemia  Check A1c - no known hx of DM - possible simply due to IVF and stress response  CAD - BMS CFX 2012, RCA totaled  S/P bioprosthetic AoVR in 2010  Code Status: FULL Family Communication: no family present at time of exam Disposition Plan: stable for transfer to Cardiac SDU where titration of CCB or amio gtt can be carried out prn   Consultants: PCCM >> [facility] TCTS Cardiology  Procedures: none  Antibiotics: none  DVT prophylaxis: SCDs  HPI/Subjective: The patient is awake alert and conversant.  He denies any complaints  whatsoever today.  He specifically denies chest pain shortness of breath abdominal pain nausea or vomiting.  Objective: Blood pressure 96/43, pulse 128, temperature 99.2 F (37.3 C), temperature source Oral, resp. rate 27, height 5\' 11"  (1.803 m), weight 82 kg (180 lb 12.4 oz), SpO2 96.00%.  Intake/Output Summary (Last 24 hours) at 05/07/13 1043 Last data filed at 05/07/13 1000  Gross per 24 hour  Intake 1758.5 ml  Output    875 ml  Net  883.5 ml   Exam: General: No acute respiratory distress Lungs: Clear to auscultation bilaterally without wheezes or crackles Cardiovascular: Tachycardic at approximately 120 beats per minute and irregular without murmur gallop or rub normal S1 and S2 Abdomen: Nontender, nondistended, soft, bowel sounds positive, no rebound, no ascites, no appreciable mass Extremities: No significant cyanosis, clubbing, or edema bilateral lower extremities  Data Reviewed: Basic Metabolic Panel:  Recent Labs Lab 05/04/13 0155 05/05/13 1130 05/06/13 0355  NA 137 142 138  K 3.3* 4.1 3.8  CL 97 107 104  CO2 26 29 24   GLUCOSE 155* 122* 105*  BUN 15 14 15   CREATININE 0.76 0.69 0.65  CALCIUM 9.3 8.8 8.6   Liver Function Tests:  Recent Labs Lab 05/04/13 0155  AST 23  ALT 12  ALKPHOS 52  BILITOT 0.4  PROT 7.2  ALBUMIN 4.2    Recent Labs Lab 05/04/13 0155  LIPASE 23   CBC:  Recent Labs Lab 05/04/13 0155 05/05/13 1130 05/06/13 0355  WBC 12.1* 7.2 6.8  NEUTROABS 10.3*  --   --   HGB 12.4* 10.3* 10.1*  HCT  37.7* 32.3* 31.5*  MCV 93.1 95.8 95.2  PLT 231 148* 144*   BNP (last 3 results)  Recent Labs  05/04/13 0155  PROBNP 484.8*   CBG:  Recent Labs Lab 05/05/13 1555 05/05/13 1939 05/05/13 2332 05/06/13 0452 05/06/13 0812  GLUCAP 133* 120* 107* 107* 103*    Recent Results (from the past 240 hour(s))  MRSA PCR SCREENING     Status: None   Collection Time    05/04/13  6:07 AM      Result Value Range Status   MRSA by PCR  NEGATIVE  NEGATIVE Final   Comment:            The GeneXpert MRSA Assay (FDA     approved for NASAL specimens     only), is one component of a     comprehensive MRSA colonization     surveillance program. It is not     intended to diagnose MRSA     infection nor to guide or     monitor treatment for     MRSA infections.     Studies:  Recent x-ray studies have been reviewed in detail by the Attending Physician  Scheduled Meds:  Scheduled Meds: . amLODipine  10 mg Oral Daily  . enalapril  5 mg Oral Daily  . metoprolol tartrate  50 mg Oral BID  . simvastatin  20 mg Oral QHS  . sodium chloride  3 mL Intravenous Q12H    Time spent on care of this patient: 35 mins   [facility] T  [facility] Office  [phone] Pager - Text Page per Loretha Stapler as per below:  On-Call/Text Page:      [email or web address]      password [email or web address]  If 7PM-7AM, please contact night-coverage [email or web address] Password [email or web address] 05/07/2013, 10:43 AM   LOS: 3 days

## 2013-05-07 NOTE — Progress Notes (Signed)
Chaplain offered emotional/spiritual support to pt and pt's family. Pt and family members were relieved and excited that pt "did not need surgery!" They identified no emotional/spiritual needs at this time. Chaplain provided support, caring presence, and reflective listening.   Maurene Capes, [place] [phone]

## 2013-05-07 NOTE — Progress Notes (Signed)
Spoke with [name] via telephone. Updated on pt. Status. Aware that HR occasionally at 130-140 but returns to low 100 -120. New medication orders received and processed. Will continue to monitor, educate, assess. [name] [name]

## 2013-05-08 ENCOUNTER — Other Ambulatory Visit: Payer: Self-pay | Admitting: *Deleted

## 2013-05-08 DIAGNOSIS — I7100 Dissection of unspecified site of aorta: Secondary | ICD-10-CM

## 2013-05-08 DIAGNOSIS — R079 Chest pain, unspecified: Secondary | ICD-10-CM

## 2013-05-08 LAB — CBC
Hemoglobin: 11.3 g/dL — ABNORMAL LOW (ref 13.0–17.0)
MCH: 30.9 pg (ref 26.0–34.0)
MCV: 93.7 fL (ref 78.0–100.0)
Platelets: 209 10*3/uL (ref 150–400)
RBC: 3.66 MIL/uL — ABNORMAL LOW (ref 4.22–5.81)
RDW: 14.3 % (ref 11.5–15.5)
WBC: 7.9 10*3/uL (ref 4.0–10.5)

## 2013-05-08 LAB — BASIC METABOLIC PANEL
BUN: 17 mg/dL (ref 6–23)
CO2: 22 mEq/L (ref 19–32)
Calcium: 8.5 mg/dL (ref 8.4–10.5)
Chloride: 103 mEq/L (ref 96–112)
Creatinine, Ser: 0.61 mg/dL (ref 0.50–1.35)
GFR calc Af Amer: >90 mL/min (ref >90)
GFR calc non Af Amer: >90 mL/min (ref >90)
Glucose, Bld: 109 mg/dL — ABNORMAL HIGH (ref 70–99)
Potassium: 3.7 mEq/L (ref 3.5–5.1)
Sodium: 137 mEq/L (ref 135–145)

## 2013-05-08 LAB — HEMOGLOBIN A1C: Mean Plasma Glucose: 120 mg/dL — ABNORMAL HIGH (ref <117)

## 2013-05-08 LAB — TSH: TSH: 1.102 u[IU]/mL (ref 0.350–4.500)

## 2013-05-08 LAB — MAGNESIUM: Magnesium: 2.2 mg/dL (ref 1.5–2.5)

## 2013-05-08 MED ORDER — SIMVASTATIN 10 MG PO TABS
10.0000 mg | ORAL_TABLET | Freq: Every day | ORAL | Status: DC
  Administered 2013-05-08 – 2013-05-12 (×5): 10 mg via ORAL
  Filled 2013-05-08 (×6): qty 1

## 2013-05-08 MED ORDER — FUROSEMIDE 40 MG PO TABS
40.0000 mg | ORAL_TABLET | Freq: Every day | ORAL | Status: DC
  Administered 2013-05-08 – 2013-05-09 (×2): 40 mg via ORAL
  Filled 2013-05-08 (×3): qty 1

## 2013-05-08 MED ORDER — ACETAMINOPHEN 325 MG PO TABS
650.0000 mg | ORAL_TABLET | ORAL | Status: DC | PRN
  Administered 2013-05-08 – 2013-05-10 (×3): 650 mg via ORAL
  Filled 2013-05-08 (×3): qty 2

## 2013-05-08 MED ORDER — DEXTROSE 5 % IV SOLN
5.0000 mg/h | INTRAVENOUS | Status: DC
  Administered 2013-05-08: 5 mg/h via INTRAVENOUS
  Administered 2013-05-08 – 2013-05-09 (×3): 15 mg/h via INTRAVENOUS
  Filled 2013-05-08 (×4): qty 100

## 2013-05-08 MED ORDER — PANTOPRAZOLE SODIUM 40 MG PO TBEC
40.0000 mg | DELAYED_RELEASE_TABLET | Freq: Every day | ORAL | Status: DC
  Administered 2013-05-08 – 2013-05-13 (×6): 40 mg via ORAL
  Filled 2013-05-08 (×6): qty 1

## 2013-05-08 MED ORDER — DILTIAZEM HCL 100 MG IV SOLR: 5.0000 mg/h | INTRAVENOUS | Status: DC

## 2013-05-08 NOTE — Progress Notes (Signed)
SUBJECTIVE:  No CP; no palpitations. AFib started yesterday.  IV AMio started as well.  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  OBJECTIVE:   Vitals:   Filed Vitals:   05/08/13 0420 05/08/13 0600 05/08/13 0700 05/08/13 0805  BP:  126/80  118/77  Pulse:  118 124   Temp:    99.2 F (37.3 C)  TempSrc:    Oral  Resp:  21 21   Height:      Weight: 181 lb 4.8 oz (82.237 kg)     SpO2:  97% 97%    I&O's:    Intake/Output Summary (Last 24 hours) at 05/08/13 1610 Last data filed at 05/08/13 0856  Gross per 24 hour  Intake 1530.7 ml  Output    900 ml  Net  630.7 ml   TELEMETRY: Reviewed telemetry pt in AFib with RVR:     PHYSICAL EXAM General: Well developed, well nourished, in no acute distress Head: Eyes PERRLA, No xanthomas.   Normal cephalic and atramatic  Lungs:   No wheezing Heart:  Irregularly irregular Abdomen: nondistended Msk:   Normal strength and tone for age. Extremities:  No edema.   Neuro: Alert and oriented X 3. Psych:  Normal affect, responds appropriately   LABS: Basic Metabolic Panel:  Recent Labs  96/04/54 0355 05/08/13 0532  NA 138 137  K 3.8 3.7  CL 104 103  CO2 24 22  GLUCOSE 105* 109*  BUN 15 17  CREATININE 0.65 0.61  CALCIUM 8.6 8.5  MG  --  2.2   Liver Function Tests: No results found for this basename: AST, ALT, ALKPHOS, BILITOT, PROT, ALBUMIN,  in the last 72 hours No results found for this basename: LIPASE, AMYLASE,  in the last 72 hours CBC:  Recent Labs  05/06/13 0355 05/08/13 0532  WBC 6.8 7.9  HGB 10.1* 11.3*  HCT 31.5* 34.3*  MCV 95.2 93.7  PLT 144* 209   Cardiac Enzymes: No results found for this basename: CKTOTAL, CKMB, CKMBINDEX, TROPONINI,  in the last 72 hours BNP: No components found with this basename: POCBNP,  D-Dimer: No results found for this basename: DDIMER,  in the last 72 hours Hemoglobin A1C: No results found for this basename: HGBA1C,  in the last 72 hours Fasting Lipid Panel: No results  found for this basename: CHOL, HDL, LDLCALC, TRIG, CHOLHDL, LDLDIRECT,  in the last 72 hours Thyroid Function Tests: No results found for this basename: TSH, T4TOTAL, FREET3, T3FREE, THYROIDAB,  in the last 72 hours Anemia Panel: No results found for this basename: VITAMINB12, FOLATE, FERRITIN, TIBC, IRON, RETICCTPCT,  in the last 72 hours Coag Panel:   Lab Results  Component Value Date   INR 1.15 05/04/2013   INR 1.03 07/28/2010   INR 1.11 04/13/2009    RADIOLOGY: Dg Chest Portable 1 View  05/04/2013   CLINICAL DATA:  Chest pain.  EXAM: PORTABLE CHEST - 1 VIEW  COMPARISON:  Chest radiograph performed 07/28/2010, and CTA of the chest performed 05/30/2012  FINDINGS: The lungs are well-aerated. Pulmonary vascularity is at the upper limits of normal. Mild left basilar atelectasis is noted. There is no evidence of pleural effusion or pneumothorax.  The cardiomediastinal silhouette is borderline enlarged; the patient is status post median sternotomy. No acute osseous abnormalities are seen.  IMPRESSION: Mild left basilar atelectasis noted; lungs otherwise clear. Borderline cardiomegaly noted.   Electronically Signed   By: Roanna Raider M.D.   On: 05/04/2013 02:22   Ct Angio Chest  Aortic Dissect W &/or W/o  05/04/2013   CLINICAL DATA:  Chest pain radiating to the back.  EXAM: CT ANGIOGRAPHY CHEST WITH CONTRAST  TECHNIQUE: Multidetector CT imaging of the chest was performed using the standard protocol during bolus administration of intravenous contrast. Multiplanar CT image reconstructions including MIPs were obtained to evaluate the vascular anatomy.  CONTRAST:  OMNIPAQUE IOHEXOL 350 MG/ML SOLN  COMPARISON:  CTA of the chest performed 05/30/2012  FINDINGS: The thoracic aorta is dilated to 4.5 cm in maximal diameter, perhaps slightly more prominent than on the prior study. There is a new dissection flap arising from the distal aspect of the aortic arch, with a single fenestration at the proximal  aspect of the dissection, and decreased enhancement of blood within the false lumen. The distal aspect of the false lumen appears to be fully clotted; the false lumen resolves just proximal to the level of the diaphragm. This causes mild luminal narrowing along the descending thoracic aorta.  The patient is status post ascending aortic repair; postoperative change is unremarkable in appearance. The great vessels are unremarkable in appearance. There is no evidence of pulmonary embolus.  Mild left basilar atelectasis is noted. Minimal emphysematous change is noted at the lung apices. The lungs are otherwise clear. There is no evidence of pleural effusion or pneumothorax. No masses are identified.  No mediastinal lymphadenopathy is appreciated. No pericardial effusion is seen. The patient is status post median sternotomy. No axillary lymphadenopathy is seen. The thyroid gland is unremarkable in appearance.  The visualized portions of the liver and spleen are unremarkable. A 3.2 cm cyst is noted at the upper pole of the right kidney.  No acute osseous abnormalities are seen. Mild degenerative change is noted at the lower cervical spine.  Review of the MIP images confirms the above findings.  IMPRESSION: 1. New dissection flap arising from the distal aspect of the aortic arch, extending along the descending thoracic aorta and resolving just proximal to the level of the diaphragm. No evidence of involvement of branch vessels. The distal aspect of the false lumen appears to be fully clotted, while there is decreased enhancement of blood within the more proximal false lumen, with a single fenestration noted at the proximal aspect of the dissection. Associated mild luminal narrowing along the descending thoracic aorta. The thoracic aorta measures 4.5 cm in maximal diameter, perhaps slightly more prominent than on the prior study. 2. Ascending thoracic aorta repair is unremarkable in appearance. 3. No evidence of pulmonary  embolus. 4. Mild left basilar atelectasis noted; minimal emphysematous change at the lung apices. Lungs otherwise clear. 5. Right renal cyst noted.  These results were called by telephone at the time of interpretation on 05/04/2013 at 4:10 AM to Dr. Brandt Loosen , who verbally acknowledged these results.   Electronically Signed   By: Roanna Raider M.D.   On: 05/04/2013 04:27      ASSESSMENT: Aortic dissection, AFib, CAD  PLAN:  Continued BP control.  Now off of IV drips for BP lowering.  Contine oral meds. Starting IV Cardizem.  Still in AFib: would have to consider anticoagulation if safe from a surgical standpoint.  COntinue IV Amiodarone.  Hopefully, he will convert.  Could consider cardioversion but he would still need anticoagulation for a month afterwards.  If bleeding risk outweighs benefit of stroke reduction, would just continue rate control.   Would like to see from Dr. Tyrone Sage when he thinks anticoagulation may be ok to use.  No angina.  Negative troponin.  [name]., MD  05/08/2013  9:27 AM

## 2013-05-08 NOTE — Progress Notes (Signed)
Dr. Tyrone Sage contacted to address anticoagulation. He will discuss with [name].  Theodore Demark, PA-C 05/08/2013 3:14 PM [facility] [phone]

## 2013-05-08 NOTE — Progress Notes (Signed)
Physical Therapy Treatment Patient Details Name: MIRANDA GARBER MRN: [id number] DOB: [birth year] Today's Date: 05/08/2013 Time: 1440-1510 PT Time Calculation (min): 30 min  PT Assessment / Plan / Recommendation  History of Present Illness 72 year-old male with previous aortic dissection presenting with new type B aortic dissection. Stable now on medical therapy.   PT Comments   Pt admitted with above. Pt currently with functional limitations due to endurance deficits with nursing asking for pt not to be pushed prior to PT going in to see pt.   Pt will benefit from skilled PT to increase their independence and safety with mobility to allow discharge to the venue listed below.   Follow Up Recommendations  Home health PT;Supervision/Assistance - 24 hour                 Equipment Recommendations  None recommended by PT        Frequency Min 3X/week   Progress towards PT Goals Progress towards PT goals: Progressing toward goals  Plan Current plan remains appropriate    Precautions / Restrictions Precautions Precautions: Fall Restrictions Weight Bearing Restrictions: No   Pertinent Vitals/Pain HR 87-103 bpm with activity.  O2 sat >90% with activity, BP 111/54 .  No pain   Mobility  Transfers Transfers: Sit to Stand;Stand to Sit Sit to Stand: 4: Min assist;With upper extremity assist;With armrests;From chair/3-in-1 Stand to Sit: 4: Min assist;With upper extremity assist;With armrests;To chair/3-in-1 Details for Transfer Assistance: Verbal cues for hand placement and assist to bring hips up. Ambulation/Gait Ambulation/Gait Assistance: 4: Min assist Ambulation Distance (Feet): 15 Feet Assistive device:  (pushing IV pole) Ambulation/Gait Assistance Details: verbal cues for upright posture and to widen BOS.  Steadying assist needed.   Gait Pattern: Step-through pattern;Decreased step length - right;Decreased step length - left;Shuffle;Trunk flexed Gait velocity: slow Stairs:  No Wheelchair Mobility Wheelchair Mobility: No    Exercises General Exercises - Lower Extremity Ankle Circles/Pumps: AROM;Both;10 reps;Seated Long Arc Quad: AROM;Both;10 reps;Seated Hip ABduction/ADduction: AROM;Both;10 reps;Seated Hip Flexion/Marching: AROM;Both;10 reps;Seated    PT Goals (current goals can now be found in the care plan section)    Visit Information  Last PT Received On: 05/08/13 Assistance Needed: +1 History of Present Illness: 72 year-old male with previous aortic dissection presenting with new type B aortic dissection. Stable now on medical therapy.    Subjective Data  Subjective: "II will try."   Cognition  Cognition Arousal/Alertness: Awake/alert Behavior During Therapy: WFL for tasks assessed/performed Overall Cognitive Status: Within Functional Limits for tasks assessed    Balance  Static Standing Balance Static Standing - Balance Support: Bilateral upper extremity supported Static Standing - Level of Assistance: 4: Min assist  End of Session PT - End of Session Equipment Utilized During Treatment: Gait belt Activity Tolerance: Patient limited by fatigue Patient left: in chair;with call bell/phone within reach;with family/visitor present Nurse Communication: Mobility status        INGOLD,Teddy Rebstock 05/08/2013, 3:17 PM [facility] [phone] (pager)

## 2013-05-08 NOTE — Progress Notes (Signed)
[facility] Progress Note [place] TEAM 1 - Stepdown/ICU TEAM   ULISSES VONDRAK ZOX:[id number] DOB: 1941/01/02 DOA: 05/04/2013 PCP: [name]., MD  Admit HPI / Brief Narrative: 72 year old male with prior aortic valve replacement who presented to [facility] with abdominal pain and back pain. The pain began spontaneously while the patient was watching football. It was severe and tearing in nature. It was reminiscent of previous pain he had when he was first found to have an aortic aneurysm many years ago. There was no associated fevers, chills, shortness of breath, nausea, or vomiting. He did feel a little lightheaded when the pain began.  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 - has been tx w/ labetalol gtt and now has been transitioned to oral meds  Atrial fibrillation w/ RVR - newly diagnosed 05/06/13 not a candidate for anticoagulation at this time - IV Amiodarone per Cardiology- also started on CArdizem infusion  - anticoagulation will be deferred to CT surgery- was on Plavix at home  HTN  Well controlled at present/borderline hypotension - follow  - resume lasix 40 mg daily (takes BID at home)  Mild chronic anemia  Hgb is stable  Hypokalemia  Resolved  - follow with resuming Lasix  Lactic acidosis  Level not rechecked, but clinically suspect has resolved   Hyperglycemia  Check A1c - no known hx of DM - possible simply due to IVF and stress response  CAD - BMS CFX 2012, RCA totaled  S/P bioprosthetic AoVR in 2010  Code Status: FULL Family Communication: no family present at time of exam Disposition Plan: stable for transfer to Cardiac SDU where titration of CCB or amio gtt can be carried out prn   Consultants: PCCM >>  TR[facility] TCTS Cardiology  Procedures: none  Antibiotics: none  DVT prophylaxis: SCDs  HPI/Subjective: The patient is awake alert and conversant.  Wife at bedside. He had some epigastric/substernal chest pain today (3/10). Wife states he takes Prevacid at home which has not been resumed. EKG done at the time of the pain is unremarkable.  PO intake poor compared to home.   Objective: Blood pressure 118/77, pulse 124, temperature 99.2 F (37.3 C), temperature source Oral, resp. rate 21, height 5\' 10"  (1.778 m), weight 82.237 kg (181 lb 4.8 oz), SpO2 97.00%.  Intake/Output Summary (Last 24 hours) at 05/08/13 1555 Last data filed at 05/08/13 1249  Gross per 24 hour  Intake  870.5 ml  Output    800 ml  Net   70.5 ml   Exam: General: No acute respiratory distress Lungs: Clear to auscultation bilaterally without wheezes or crackles Cardiovascular: IIRR. HR 70-80s Abdomen: Nontender, nondistended, soft, bowel sounds positive, no rebound, no ascites, no appreciable mass Extremities: No significant cyanosis, clubbing, or edema bilateral lower extremities  Data Reviewed: Basic Metabolic Panel:  Recent Labs Lab 05/04/13 0155 05/05/13 1130 05/06/13 0355 05/08/13 0532  NA 137 142 138 137  K 3.3* 4.1 3.8 3.7  CL 97 107 104 103  CO2 26 29 24 22   GLUCOSE 155* 122* 105* 109*  BUN 15 14 15 17   CREATININE 0.76 0.69 0.65 0.61  CALCIUM 9.3 8.8 8.6 8.5  MG  --   --   --  2.2   Liver Function Tests:  Recent Labs Lab 05/04/13 0155  AST  23  ALT 12  ALKPHOS 52  BILITOT 0.4  PROT 7.2  ALBUMIN 4.2    Recent Labs Lab 05/04/13 0155  LIPASE 23   CBC:  Recent Labs Lab 05/04/13 0155 05/05/13 1130 05/06/13 0355 05/08/13 0532  WBC 12.1* 7.2 6.8 7.9  NEUTROABS 10.3*  --   --   --   HGB 12.4* 10.3* 10.1* 11.3*  HCT 37.7* 32.3* 31.5* 34.3*  MCV 93.1 95.8 95.2 93.7  PLT 231 148* 144* 209   BNP (last 3 results)  Recent Labs  05/04/13 0155  PROBNP 484.8*   CBG:  Recent  Labs Lab 05/05/13 1555 05/05/13 1939 05/05/13 2332 05/06/13 0452 05/06/13 0812  GLUCAP 133* 120* 107* 107* 103*    Recent Results (from the past 240 hour(s))  MRSA PCR SCREENING     Status: None   Collection Time    05/04/13  6:07 AM      Result Value Range Status   MRSA by PCR NEGATIVE  NEGATIVE Final   Comment:            The GeneXpert MRSA Assay (FDA     approved for NASAL specimens     only), is one component of a     comprehensive MRSA colonization     surveillance program. It is not     intended to diagnose MRSA     infection nor to guide or     monitor treatment for     MRSA infections.     Studies:  Recent x-ray studies have been reviewed in detail by the Attending Physician  Scheduled Meds:  Scheduled Meds: . enalapril  5 mg Oral Daily  . furosemide  40 mg Oral Daily  . metoprolol tartrate  50 mg Oral BID  . pantoprazole  40 mg Oral Daily  . simvastatin  10 mg Oral QHS  . sodium chloride  3 mL Intravenous Q12H    Time spent on care of this patient: 35 mins   Calvert Cantor, MD  [facility] Office  [phone] Pager - Text Page per Loretha Stapler as per below:  On-Call/Text Page:      [email or web address]      password [email or web address]  If 7PM-7AM, please contact night-coverage [email or web address] Password [email or web address] 05/08/2013, 3:55 PM   LOS: 4 days

## 2013-05-09 ENCOUNTER — Inpatient Hospital Stay (HOSPITAL_COMMUNITY): Payer: Medicare Other

## 2013-05-09 LAB — GLUCOSE, CAPILLARY
Glucose-Capillary: 119 mg/dL — ABNORMAL HIGH (ref 70–99)
Glucose-Capillary: 118 mg/dL — ABNORMAL HIGH (ref 70–99)

## 2013-05-09 LAB — BASIC METABOLIC PANEL
Calcium: 8.1 mg/dL — ABNORMAL LOW (ref 8.4–10.5)
Chloride: 102 mEq/L (ref 96–112)
Creatinine, Ser: 0.63 mg/dL (ref 0.50–1.35)
GFR calc Af Amer: >90 mL/min (ref >90)
Sodium: 138 mEq/L (ref 135–145)

## 2013-05-09 MED ORDER — AMIODARONE HCL 200 MG PO TABS
200.0000 mg | ORAL_TABLET | Freq: Every day | ORAL | Status: DC
  Administered 2013-05-09 – 2013-05-10 (×2): 200 mg via ORAL
  Filled 2013-05-09 (×3): qty 1

## 2013-05-09 MED ORDER — DILTIAZEM HCL ER COATED BEADS 360 MG PO CP24
360.0000 mg | ORAL_CAPSULE | Freq: Every day | ORAL | Status: DC
  Administered 2013-05-09 – 2013-05-11 (×3): 360 mg via ORAL
  Filled 2013-05-09 (×5): qty 1

## 2013-05-09 MED ORDER — POTASSIUM CHLORIDE CRYS ER 20 MEQ PO TBCR
40.0000 meq | EXTENDED_RELEASE_TABLET | Freq: Once | ORAL | Status: AC
  Administered 2013-05-09: 40 meq via ORAL
  Filled 2013-05-09: qty 2

## 2013-05-09 MED ORDER — POTASSIUM CHLORIDE 20 MEQ/15ML (10%) PO LIQD
40.0000 meq | Freq: Once | ORAL | Status: DC
  Filled 2013-05-09: qty 30

## 2013-05-09 NOTE — Progress Notes (Addendum)
SUBJECTIVE:  No CP; no palpitations. AFib started yesterday.  IV AMio started as well.  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  OBJECTIVE:   Vitals:   Filed Vitals:   05/09/13 0000 05/09/13 0200 05/09/13 0400 05/09/13 0700  BP: 114/53 121/58 115/47 129/52  Pulse: 70 75 79 100  Temp:   98.3 F (36.8 C) 98.9 F (37.2 C)  TempSrc:   Oral Oral  Resp: 23 24 22 23   Height:      Weight:      SpO2: 93% 93% 93% 95%   I&O's:    Intake/Output Summary (Last 24 hours) at 05/09/13 0908 Last data filed at 05/09/13 0700  Gross per 24 hour  Intake   1159 ml  Output    900 ml  Net    259 ml   TELEMETRY: Reviewed telemetry pt in AFib with RVR:     PHYSICAL EXAM General: Well developed, well nourished, in no acute distress Head: Eyes PERRLA, No xanthomas.   Normal cephalic and atramatic  Lungs:   No wheezing Heart:  Irregularly irregular Abdomen: nondistended Msk:   Normal strength and tone for age. Extremities:  No edema.   Neuro: Alert and oriented X 3. Psych:  Normal affect, responds appropriately   LABS: Basic Metabolic Panel:  Recent Labs  16/10/96 0532 05/09/13 0417  NA 137 138  K 3.7 3.4*  CL 103 102  CO2 22 27  GLUCOSE 109* 117*  BUN 17 15  CREATININE 0.61 0.63  CALCIUM 8.5 8.1*  MG 2.2  --    Liver Function Tests: No results found for this basename: AST, ALT, ALKPHOS, BILITOT, PROT, ALBUMIN,  in the last 72 hours No results found for this basename: LIPASE, AMYLASE,  in the last 72 hours CBC:  Recent Labs  05/08/13 0532  WBC 7.9  HGB 11.3*  HCT 34.3*  MCV 93.7  PLT 209   Cardiac Enzymes: No results found for this basename: CKTOTAL, CKMB, CKMBINDEX, TROPONINI,  in the last 72 hours BNP: No components found with this basename: POCBNP,  D-Dimer: No results found for this basename: DDIMER,  in the last 72 hours Hemoglobin A1C:  Recent Labs  05/08/13 0532  HGBA1C 5.8*   Fasting Lipid Panel: No results found for this basename: CHOL, HDL,  LDLCALC, TRIG, CHOLHDL, LDLDIRECT,  in the last 72 hours Thyroid Function Tests:  Recent Labs  05/08/13 0532  TSH 1.102   Anemia Panel: No results found for this basename: VITAMINB12, FOLATE, FERRITIN, TIBC, IRON, RETICCTPCT,  in the last 72 hours Coag Panel:   Lab Results  Component Value Date   INR 1.15 05/04/2013   INR 1.03 07/28/2010   INR 1.11 04/13/2009    RADIOLOGY: Dg Chest Portable 1 View  05/04/2013   CLINICAL DATA:  Chest pain.  EXAM: PORTABLE CHEST - 1 VIEW  COMPARISON:  Chest radiograph performed 07/28/2010, and CTA of the chest performed 05/30/2012  FINDINGS: The lungs are well-aerated. Pulmonary vascularity is at the upper limits of normal. Mild left basilar atelectasis is noted. There is no evidence of pleural effusion or pneumothorax.  The cardiomediastinal silhouette is borderline enlarged; the patient is status post median sternotomy. No acute osseous abnormalities are seen.  IMPRESSION: Mild left basilar atelectasis noted; lungs otherwise clear. Borderline cardiomegaly noted.   Electronically Signed   By: Roanna Raider M.D.   On: 05/04/2013 02:22   Ct Angio Chest Aortic Dissect W &/or W/o  05/04/2013   CLINICAL DATA:  Chest  pain radiating to the back.  EXAM: CT ANGIOGRAPHY CHEST WITH CONTRAST  TECHNIQUE: Multidetector CT imaging of the chest was performed using the standard protocol during bolus administration of intravenous contrast. Multiplanar CT image reconstructions including MIPs were obtained to evaluate the vascular anatomy.  CONTRAST:  OMNIPAQUE IOHEXOL 350 MG/ML SOLN  COMPARISON:  CTA of the chest performed 05/30/2012  FINDINGS: The thoracic aorta is dilated to 4.5 cm in maximal diameter, perhaps slightly more prominent than on the prior study. There is a new dissection flap arising from the distal aspect of the aortic arch, with a single fenestration at the proximal aspect of the dissection, and decreased enhancement of blood within the false lumen. The  distal aspect of the false lumen appears to be fully clotted; the false lumen resolves just proximal to the level of the diaphragm. This causes mild luminal narrowing along the descending thoracic aorta.  The patient is status post ascending aortic repair; postoperative change is unremarkable in appearance. The great vessels are unremarkable in appearance. There is no evidence of pulmonary embolus.  Mild left basilar atelectasis is noted. Minimal emphysematous change is noted at the lung apices. The lungs are otherwise clear. There is no evidence of pleural effusion or pneumothorax. No masses are identified.  No mediastinal lymphadenopathy is appreciated. No pericardial effusion is seen. The patient is status post median sternotomy. No axillary lymphadenopathy is seen. The thyroid gland is unremarkable in appearance.  The visualized portions of the liver and spleen are unremarkable. A 3.2 cm cyst is noted at the upper pole of the right kidney.  No acute osseous abnormalities are seen. Mild degenerative change is noted at the lower cervical spine.  Review of the MIP images confirms the above findings.  IMPRESSION: 1. New dissection flap arising from the distal aspect of the aortic arch, extending along the descending thoracic aorta and resolving just proximal to the level of the diaphragm. No evidence of involvement of branch vessels. The distal aspect of the false lumen appears to be fully clotted, while there is decreased enhancement of blood within the more proximal false lumen, with a single fenestration noted at the proximal aspect of the dissection. Associated mild luminal narrowing along the descending thoracic aorta. The thoracic aorta measures 4.5 cm in maximal diameter, perhaps slightly more prominent than on the prior study. 2. Ascending thoracic aorta repair is unremarkable in appearance. 3. No evidence of pulmonary embolus. 4. Mild left basilar atelectasis noted; minimal emphysematous change at the lung  apices. Lungs otherwise clear. 5. Right renal cyst noted.  These results were called by telephone at the time of interpretation on 05/04/2013 at 4:10 AM to Dr. Brandt Loosen , who verbally acknowledged these results.   Electronically Signed   By: Roanna Raider M.D.   On: 05/04/2013 04:27      ASSESSMENT: Aortic dissection, AFib, CAD  PLAN:  Continued BP control.  Now off of IV drips for BP lowering.  Contine oral meds. Starting IV Cardizem.  Still in AFib: would have to consider anticoagulation if safe from a surgical standpoint.  COntinue IV Amiodarone.  Hopefully, he will convert.   continue rate control. Increase IV cardizem to 20 mg daily.  May have to hold enalapril.    Discussed with  Dr. Tyrone Sage .  Start anticoagulation 2 weeks post dissection.  No angina.  Negative troponin.  [name]., MD  05/09/2013  9:08 AM   Patient's heart rate is much better controlled on higher dose of  IV Cardizem. Will switch Cardizem to oral preparation. Cardizem CD 360 mg by mouth daily. Stop intravenous amiodarone and switched to oral amiodarone, 200 mg by mouth daily. He will be started on a novel oral anticoagulant in about 10 days, with the delay being due to his type B aortic dissection.  OK for discharge now that he is rate controlled.

## 2013-05-09 NOTE — Progress Notes (Signed)
[facility] Progress Note [place] TEAM 1 - Stepdown/ICU TEAM   LAKEN LOBATO WUJ:[id number] DOB: Oct 23, 1940 DOA: 05/04/2013 PCP: [name]., MD  Admit HPI / Brief Narrative: 72 year old male with prior aortic valve replacement who presented to [facility] with abdominal pain and back pain. The pain began spontaneously while the patient was watching football. It was severe and tearing in nature. It was reminiscent of previous pain he had when he was first found to have an aortic aneurysm many years ago. There was no associated fevers, chills, shortness of breath, nausea, or vomiting. He did feel a little lightheaded when the pain began.  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 - has been tx w/ labetalol gtt and now has been transitioned to oral meds  Atrial fibrillation w/ RVR - newly diagnosed 05/06/13 not a candidate for anticoagulation at this time - IV Amiodarone per Cardiology- also started on CArdizem infusion  - anticoagulation to be resumed in 2 wks per CT surgery-  was on Plavix at home  HTN  Well controlled at present/borderline hypotension - follow  - cont lasix 40 mg daily (takes BID at home)  Mild chronic anemia  Hgb is stable  Hypokalemia  Resolved  -replace with resuming Lasix  Lactic acidosis  Level not rechecked, but clinically suspect has resolved   Hyperglycemia   A1c 5.8 - no known hx of DM - possible simply due to IVF and stress response  CAD - BMS CFX 2012, RCA totaled  S/P bioprosthetic AoVR in 2010  Code Status: FULL Family Communication: dicussed with wife Disposition Plan:  SDU   Consultants: PCCM >> TR[facility] TCTS Cardiology  Procedures: none  Antibiotics: none  DVT  prophylaxis: SCDs  HPI/Subjective: The patient is awake alert and sitting in a chair. Has no complaints.  Objective: Blood pressure 127/49, pulse 100, temperature 98.4 F (36.9 C), temperature source Oral, resp. rate 28, height 5\' 10"  (1.778 m), weight 82.237 kg (181 lb 4.8 oz), SpO2 98.00%.  Intake/Output Summary (Last 24 hours) at 05/09/13 1410 Last data filed at 05/09/13 1300  Gross per 24 hour  Intake 1265.7 ml  Output    775 ml  Net  490.7 ml   Exam: General: No acute respiratory distress Lungs: Clear to auscultation bilaterally without wheezes or crackles Cardiovascular: IIRR. HR 70-80s Abdomen: Nontender, nondistended, soft, bowel sounds positive, no rebound, no ascites, no appreciable mass Extremities: No significant cyanosis, clubbing, or edema bilateral lower extremities  Data Reviewed: Basic Metabolic Panel:  Recent Labs Lab 05/04/13 0155 05/05/13 1130 05/06/13 0355 05/08/13 0532 05/09/13 0417  NA 137 142 138 137 138  K 3.3* 4.1 3.8 3.7 3.4*  CL 97 107 104 103 102  CO2 26 29 24 22 27   GLUCOSE 155* 122* 105* 109* 117*  BUN 15 14 15 17 15   CREATININE 0.76 0.69 0.65 0.61 0.63  CALCIUM 9.3 8.8 8.6 8.5 8.1*  MG  --   --   --  2.2  --    Liver Function Tests:  Recent Labs Lab 05/04/13 0155  AST 23  ALT 12  ALKPHOS 52  BILITOT 0.4  PROT 7.2  ALBUMIN 4.2    Recent Labs Lab 05/04/13 0155  LIPASE 23   CBC:  Recent Labs Lab 05/04/13 0155 05/05/13 1130 05/06/13 0355 05/08/13  0532  WBC 12.1* 7.2 6.8 7.9  NEUTROABS 10.3*  --   --   --   HGB 12.4* 10.3* 10.1* 11.3*  HCT 37.7* 32.3* 31.5* 34.3*  MCV 93.1 95.8 95.2 93.7  PLT 231 148* 144* 209   BNP (last 3 results)  Recent Labs  05/04/13 0155  PROBNP 484.8*   CBG:  Recent Labs Lab 05/05/13 1939 05/05/13 2332 05/06/13 0452 05/06/13 0812 05/09/13 0746  GLUCAP 120* 107* 107* 103* 119*    Recent Results (from the past 240 hour(s))  MRSA PCR SCREENING     Status: None   Collection  Time    05/04/13  6:07 AM      Result Value Range Status   MRSA by PCR NEGATIVE  NEGATIVE Final   Comment:            The GeneXpert MRSA Assay (FDA     approved for NASAL specimens     only), is one component of a     comprehensive MRSA colonization     surveillance program. It is not     intended to diagnose MRSA     infection nor to guide or     monitor treatment for     MRSA infections.     Studies:  Recent x-ray studies have been reviewed in detail by the Attending Physician  Scheduled Meds:  Scheduled Meds: . amiodarone  200 mg Oral Daily  . diltiazem  360 mg Oral Daily  . enalapril  5 mg Oral Daily  . furosemide  40 mg Oral Daily  . metoprolol tartrate  50 mg Oral BID  . pantoprazole  40 mg Oral Daily  . potassium chloride  40 mEq Oral Once  . simvastatin  10 mg Oral QHS  . sodium chloride  3 mL Intravenous Q12H    Time spent on care of this patient: 35 mins   Calvert Cantor, MD  [facility] Office  [phone] Pager - Text Page per Loretha Stapler as per below:  On-Call/Text Page:      [email or web address]      password [email or web address]  If 7PM-7AM, please contact night-coverage [email or web address] Password [email or web address] 05/09/2013, 2:10 PM   LOS: 5 days

## 2013-05-10 ENCOUNTER — Encounter (HOSPITAL_COMMUNITY): Payer: Self-pay

## 2013-05-10 DIAGNOSIS — M79609 Pain in unspecified limb: Secondary | ICD-10-CM

## 2013-05-10 DIAGNOSIS — M7989 Other specified soft tissue disorders: Secondary | ICD-10-CM

## 2013-05-10 LAB — BASIC METABOLIC PANEL
BUN: 16 mg/dL (ref 6–23)
Calcium: 8.7 mg/dL (ref 8.4–10.5)
Creatinine, Ser: 0.6 mg/dL (ref 0.50–1.35)
GFR calc Af Amer: >90 mL/min (ref >90)
GFR calc non Af Amer: >90 mL/min (ref >90)
Potassium: 3.6 mEq/L (ref 3.5–5.1)

## 2013-05-10 LAB — CBC
MCHC: 33.2 g/dL (ref 30.0–36.0)
RDW: 14.3 % (ref 11.5–15.5)
WBC: 11.2 10*3/uL — ABNORMAL HIGH (ref 4.0–10.5)

## 2013-05-10 MED ORDER — AMIODARONE HCL 200 MG PO TABS
200.0000 mg | ORAL_TABLET | Freq: Two times a day (BID) | ORAL | Status: DC
  Administered 2013-05-10 – 2013-05-11 (×2): 200 mg via ORAL
  Filled 2013-05-10 (×3): qty 1

## 2013-05-10 MED ORDER — POTASSIUM CHLORIDE CRYS ER 20 MEQ PO TBCR
40.0000 meq | EXTENDED_RELEASE_TABLET | Freq: Every day | ORAL | Status: DC
  Administered 2013-05-10 – 2013-05-13 (×4): 40 meq via ORAL
  Filled 2013-05-10 (×5): qty 2

## 2013-05-10 MED ORDER — FUROSEMIDE 80 MG PO TABS
80.0000 mg | ORAL_TABLET | Freq: Two times a day (BID) | ORAL | Status: DC
  Administered 2013-05-10 – 2013-05-13 (×7): 80 mg via ORAL
  Filled 2013-05-10: qty 1
  Filled 2013-05-10: qty 2
  Filled 2013-05-10 (×8): qty 1

## 2013-05-10 MED ORDER — VANCOMYCIN HCL IN DEXTROSE 1-5 GM/200ML-% IV SOLN
1000.0000 mg | Freq: Two times a day (BID) | INTRAVENOUS | Status: DC
  Administered 2013-05-10 (×2): 1000 mg via INTRAVENOUS
  Filled 2013-05-10 (×4): qty 200

## 2013-05-10 NOTE — Progress Notes (Signed)
Spoke with Dr. Anne Fu r/t patients HR 118-155. Patient asymptomatic. Vitals stable. Order received to give 10am PO medications early and monitor patient closely. If patient becomes symptomatic RN will notify MD immediately. Patient denies complaints

## 2013-05-10 NOTE — Progress Notes (Signed)
Patient complaining of right upper arm tenderness. Appears to be an old IV infiltrate site. Right AC and upper arm firm, edematous, and warm to touch. Dr. Butler Denmark aware. Warm compress applied and extremity elevated. Awaiting arrival of IV antibiotic and upper extremity doppler. Will encourage patient to continue to elevate arm and notify RN with increased pain.

## 2013-05-10 NOTE — Progress Notes (Signed)
ANTIBIOTIC CONSULT NOTE - INITIAL  Pharmacy Consult for Vancomycin Indication: Cellulitis  Allergies  Allergen Reactions  . Lortab [Hydrocodone-Acetaminophen] Hives  . Morphine And Related Hives    Patient Measurements: Height: 5\' 10"  (177.8 cm) Weight: 183 lb 14.4 oz (83.416 kg) IBW/kg (Calculated) : 73 Adjusted Body Weight: n/a  Vital Signs: Temp: 97.8 F (36.6 C) (11/22 0843) Temp src: Axillary (11/22 0843) BP: 130/78 mmHg (11/22 0835) Pulse Rate: 84 (11/22 0357) Intake/Output from previous day: 11/21 0701 - 11/22 0700 In: 701.9 [P.O.:480; I.V.:221.9] Out: 825 [Urine:825] Intake/Output from this shift: Total I/O In: 240 [P.O.:240] Out: 150 [Urine:150]  Labs:  Recent Labs  05/08/13 0532 05/09/13 0417  WBC 7.9  --   HGB 11.3*  --   PLT 209  --   CREATININE 0.61 0.63   Estimated Creatinine Clearance: 84.9 ml/min (by C-G formula based on Cr of 0.63). No results found for this basename: VANCOTROUGH, Leodis Binet, VANCORANDOM, GENTTROUGH, GENTPEAK, GENTRANDOM, TOBRATROUGH, TOBRAPEAK, TOBRARND, AMIKACINPEAK, AMIKACINTROU, AMIKACIN,  in the last 72 hours   Microbiology: Recent Results (from the past 720 hour(s))  MRSA PCR SCREENING     Status: None   Collection Time    05/04/13  6:07 AM      Result Value Range Status   MRSA by PCR NEGATIVE  NEGATIVE Final   Comment:            The GeneXpert MRSA Assay (FDA     approved for NASAL specimens     only), is one component of a     comprehensive MRSA colonization     surveillance program. It is not     intended to diagnose MRSA     infection nor to guide or     monitor treatment for     MRSA infections.    Medical History: Past Medical History  Diagnosis Date  . Hyperlipidemia   . DVT (deep venous thrombosis)     post knee surgery  . Tobacco abuse   . HTN (hypertension)   . Descending aortic aneurysm   . Aortic valve insufficiency   . Thoracic aneurysm   . S/P AVR (aortic valve replacement)   . CAD  (coronary artery disease)     Medications:  Scheduled:  . amiodarone  200 mg Oral Daily  . diltiazem  360 mg Oral Daily  . enalapril  5 mg Oral Daily  . furosemide  80 mg Oral BID  . metoprolol tartrate  50 mg Oral BID  . pantoprazole  40 mg Oral Daily  . simvastatin  10 mg Oral QHS  . sodium chloride  3 mL Intravenous Q12H  . vancomycin  1,000 mg Intravenous Q12H   Assessment: 72 yo male admitted with acute type B descending aortic dissection.  Pharmacy now asked to begin vancomycin for cellulitis.  Renal function within normal limits, no cultures available.  Goal of Therapy:  Vancomycin trough level 10-15 mcg/ml  Plan:  1. Vancomycin 1g IV q 12 hrs. 2. Will f/u renal function, cultures and clinical course. 3. Check vancomycin trough level at steady state as needed. 4. F/U narrowing antibiotics as able.  Tad Moore, BCPS  Clinical Pharmacist Pager [phone]  05/10/2013 10:32 AM

## 2013-05-10 NOTE — Progress Notes (Addendum)
Subjective:   Earlier this am had another bout of AFIB RVR.  Gave medications early and he has subsequently converted. Appears to be sinus with second-degree heart block type I.  No chest pain, no shortness of breath. Wife in room. Retired Engineer, structural.   Objective:  Vital Signs in the last 24 hours: Temp:  [97.8 F (36.6 C)-99.7 F (37.6 C)] 97.8 F (36.6 C) (11/22 0843) Pulse Rate:  [84-100] 84 (11/22 0357) Resp:  [20-28] 27 (11/22 0838) BP: (95-130)/(49-78) 130/78 mmHg (11/22 0835) SpO2:  [95 %-98 %] 96 % (11/22 0835) Weight:  [183 lb 14.4 oz (83.416 kg)] 183 lb 14.4 oz (83.416 kg) (11/22 0500)  Intake/Output from previous day: 11/21 0701 - 11/22 0700 In: 701.9 [P.O.:480; I.V.:221.9] Out: 825 [Urine:825] Intake/Output from this shift: Total I/O In: 240 [P.O.:240] Out: 150 [Urine:150]  Physical Exam: General: Alert and oriented x3 in no acute distress Cardiovascular: Irregularly irregular, no murmurs Lungs: Clear to auscultation bilaterally Abdomen: Soft, nontender Extremities: Right upper extremity with heating pad, erythema.  Lab Results:  Recent Labs  05/08/13 0532  WBC 7.9  HGB 11.3*  PLT 209    Recent Labs  05/08/13 0532 05/09/13 0417  NA 137 138  K 3.7 3.4*  CL 103 102  CO2 22 27  GLUCOSE 109* 117*  BUN 17 15  CREATININE 0.61 0.63   Assessment/Plan:  Principal Problem:   Aortic dissection Active Problems:   HTN (hypertension)   Anemia   Hypokalemia   Lactic acidosis   Hyperglycemia   CAD (coronary artery disease)   S/P AVR (aortic valve replacement)   Aortic atherosclerosis   Atrial fibrillation with rapid ventricular response   1. Atrial fibrillation with rapid ventricular response-I have increased his amiodarone by mouth to 200 mg twice a day. Continue with other medications metoprolol and diltiazem. Metoprolol currently 50 mg twice a day, diltiazem CD 360 mg once a day. After discussion with Daniel Nones, RN this morning, we administered  his morning medications early and this seemed to help. He was maintaining hemodynamic stability during his bout of rapid ventricular response.  2. Aortic dissection-type B. continue with aggressive blood pressure control/medical management. Appreciate cardiothoracic surgery input. Current dissection is distal to subclavian down to diaphragm. Intramural hematoma noted. Avoid anticoagulation. Family thinks that the dissection may been exacerbated by moving heavy furniture.  3. Hypertension-currently very well controlled. Trying to maintain blood pressures between 100-110 systolic.  4. Right upper extremity erythema/edema-primary team is investigating this. Currently on antibiotic.  5. Volume overload-on Lasix 80 mg twice a day.  6. Previous type a dissection with aortic valve replacement, [place]. Stable  According to [name] note, he will be started on novel oral anticoagulant in about 10 days with the delay being due to his type B. aortic dissection.  I would like to watch him today to make sure that his heart rate remained stable. If improved, potential discharge tomorrow.   LOS: 6 days    Chanie Soucek 05/10/2013, 10:44 AM

## 2013-05-10 NOTE — Progress Notes (Signed)
VASCULAR LAB PRELIMINARY  PRELIMINARY  PRELIMINARY  PRELIMINARY  Right upper extremity venous Doppler completed.    Preliminary report:  There is no SVT noted in the right upper extremity.  There is SVT noted in the cephalic and basilic veins of the right upper extremity.  [name] [name], RVT 05/10/2013, 11:28 AM

## 2013-05-10 NOTE — Progress Notes (Signed)
[facility] Progress Note [place] TEAM 1 - Stepdown/ICU TEAM   [name] [name] GNF:[id number] DOB: [birth year] DOA: 05/04/2013 PCP: [name]., MD  Admit HPI / Brief Narrative: 72 year old male with prior aortic valve replacement who presented to [facility] with abdominal pain and back pain. The pain began spontaneously while the patient was watching football. It was severe and tearing in nature. It was reminiscent of previous pain he had when he was first found to have an aortic aneurysm many years ago. There was no associated fevers, chills, shortness of breath, nausea, or vomiting. He did feel a little lightheaded when the pain began.  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 - has been tx w/ labetalol gtt and now has been transitioned to oral meds  Atrial fibrillation w/ RVR - newly diagnosed 05/06/13 not a candidate for anticoagulation at this time -rate management has been difficult- cardiology team managing   - anticoagulation to be resumed in 2 wks per CT surgery-  was on Plavix at home  HTN  Well controlled at present/borderline hypotension - follow   Left Pleural effusion - increase Lasix today and diurese  Mild chronic anemia  Hgb is stable  Hypokalemia  Resolved  -replace with increasing Lasix  Right arm superficial venous thrombosis - due to infiltration of IV - warm compresses for now  Lactic acidosis  Level not rechecked, but clinically suspect has resolved   Hyperglycemia   A1c 5.8 - no known hx of DM - possible simply due to IVF and stress response  CAD - BMS CFX 2012, RCA totaled  S/P bioprosthetic AoVR in 2010  Code Status: FULL Family Communication: dicussed with wife Disposition Plan:  SDU    Consultants: PCCM >> [facility] TCTS Cardiology  Procedures: none  Antibiotics: none  DVT prophylaxis: SCDs  HPI/Subjective: The patient is awake alert and sitting in a chair. C/o pain in right arm and warm and swelling noted this morning.  Discussed finding of pleural effusion with patient an wife- they agree with increasing Lasix for now.  Objective: Blood pressure 115/43, pulse 84, temperature 97.3 F (36.3 C), temperature source Axillary, resp. rate 33, height 5\' 10"  (1.778 m), weight 83.416 kg (183 lb 14.4 oz), SpO2 99.00%.  Intake/Output Summary (Last 24 hours) at 05/10/13 1519 Last data filed at 05/10/13 1500  Gross per 24 hour  Intake    680 ml  Output   1775 ml  Net  -1095 ml   Exam: General: No acute respiratory distress Lungs: Clear to auscultation bilaterally without wheezes or crackles- decreased breath sounds in LLL Cardiovascular: IIRR. HR 70-80s Abdomen: Nontender, nondistended, soft, bowel sounds positive, no rebound, no ascites, no appreciable mass Extremities: No significant cyanosis, clubbing, or edema bilateral lower extremities  Data Reviewed: Basic Metabolic Panel:  Recent Labs Lab 05/05/13 1130 05/06/13 0355 05/08/13 0532 05/09/13 0417 05/10/13 1000  NA 142 138 137 138 137  K 4.1 3.8 3.7 3.4* 3.6  CL 107 104 103 102 100  CO2 29 24 22 27 27   GLUCOSE 122* 105* 109* 117* 147*  BUN 14 15 17 15 16   CREATININE 0.69 0.65 0.61 0.63 0.60  CALCIUM 8.8 8.6 8.5 8.1* 8.7  MG  --   --  2.2  --   --    Liver Function Tests:  Recent Labs Lab 05/04/13 0155  AST 23  ALT 12  ALKPHOS 52  BILITOT 0.4  PROT 7.2  ALBUMIN 4.2    Recent Labs Lab 05/04/13 0155  LIPASE 23   CBC:  Recent Labs Lab 05/04/13 0155 05/05/13 1130 05/06/13 0355 05/08/13 0532 05/10/13 1145  WBC 12.1* 7.2 6.8 7.9 11.2*  NEUTROABS 10.3*  --   --   --   --   HGB 12.4* 10.3* 10.1* 11.3* 12.7*  HCT 37.7* 32.3* 31.5* 34.3* 38.3*  MCV 93.1 95.8 95.2 93.7 93.2  PLT 231  148* 144* 209 329   BNP (last 3 results)  Recent Labs  05/04/13 0155  PROBNP 484.8*   CBG:  Recent Labs Lab 05/05/13 2332 05/06/13 0452 05/06/13 0812 05/09/13 0746 05/09/13 1228  GLUCAP 107* 107* 103* 119* 118*    Recent Results (from the past 240 hour(s))  MRSA PCR SCREENING     Status: None   Collection Time    05/04/13  6:07 AM      Result Value Range Status   MRSA by PCR NEGATIVE  NEGATIVE Final   Comment:            The GeneXpert MRSA Assay (FDA     approved for NASAL specimens     only), is one component of a     comprehensive MRSA colonization     surveillance program. It is not     intended to diagnose MRSA     infection nor to guide or     monitor treatment for     MRSA infections.     Studies:  Recent x-ray studies have been reviewed in detail by the Attending Physician  Scheduled Meds:  Scheduled Meds: . amiodarone  200 mg Oral BID  . diltiazem  360 mg Oral Daily  . enalapril  5 mg Oral Daily  . furosemide  80 mg Oral BID  . metoprolol tartrate  50 mg Oral BID  . pantoprazole  40 mg Oral Daily  . simvastatin  10 mg Oral QHS  . sodium chloride  3 mL Intravenous Q12H  . vancomycin  1,000 mg Intravenous Q12H    Time spent on care of this patient: 35 mins   Calvert Cantor, MD  [facility] Office  [phone] Pager - Text Page per Loretha Stapler as per below:  On-Call/Text Page:      [email or web address]      password [email or web address]  If 7PM-7AM, please contact night-coverage [email or web address] Password [facility] 05/10/2013, 3:19 PM   LOS: 6 days

## 2013-05-11 DIAGNOSIS — I5023 Acute on chronic systolic (congestive) heart failure: Secondary | ICD-10-CM | POA: Diagnosis present

## 2013-05-11 DIAGNOSIS — I5042 Chronic combined systolic (congestive) and diastolic (congestive) heart failure: Secondary | ICD-10-CM | POA: Diagnosis present

## 2013-05-11 DIAGNOSIS — I509 Heart failure, unspecified: Secondary | ICD-10-CM

## 2013-05-11 DIAGNOSIS — J90 Pleural effusion, not elsewhere classified: Secondary | ICD-10-CM | POA: Diagnosis present

## 2013-05-11 DIAGNOSIS — I808 Phlebitis and thrombophlebitis of other sites: Secondary | ICD-10-CM | POA: Diagnosis not present

## 2013-05-11 DIAGNOSIS — I5022 Chronic systolic (congestive) heart failure: Secondary | ICD-10-CM | POA: Diagnosis present

## 2013-05-11 HISTORY — DX: Acute on chronic systolic (congestive) heart failure: I50.23

## 2013-05-11 LAB — BASIC METABOLIC PANEL
BUN: 14 mg/dL (ref 6–23)
Calcium: 8.3 mg/dL — ABNORMAL LOW (ref 8.4–10.5)
Chloride: 98 mEq/L (ref 96–112)
Creatinine, Ser: 0.62 mg/dL (ref 0.50–1.35)
Glucose, Bld: 95 mg/dL (ref 70–99)
Potassium: 3.7 mEq/L (ref 3.5–5.1)
Sodium: 136 mEq/L (ref 135–145)

## 2013-05-11 MED ORDER — METOPROLOL TARTRATE 50 MG PO TABS
50.0000 mg | ORAL_TABLET | Freq: Four times a day (QID) | ORAL | Status: DC
  Administered 2013-05-11 – 2013-05-13 (×7): 50 mg via ORAL
  Filled 2013-05-11 (×11): qty 1

## 2013-05-11 MED ORDER — METOPROLOL TARTRATE 1 MG/ML IV SOLN
INTRAVENOUS | Status: AC
  Filled 2013-05-11: qty 5

## 2013-05-11 MED ORDER — AMIODARONE HCL 200 MG PO TABS
200.0000 mg | ORAL_TABLET | Freq: Once | ORAL | Status: AC
  Administered 2013-05-11: 200 mg via ORAL
  Filled 2013-05-11: qty 1

## 2013-05-11 MED ORDER — DICLOFENAC SODIUM 1 % TD GEL
2.0000 g | Freq: Four times a day (QID) | TRANSDERMAL | Status: DC
  Administered 2013-05-11 – 2013-05-13 (×7): 2 g via TOPICAL
  Filled 2013-05-11: qty 100

## 2013-05-11 MED ORDER — POLYETHYLENE GLYCOL 3350 17 G PO PACK
17.0000 g | PACK | Freq: Every day | ORAL | Status: DC | PRN
  Filled 2013-05-11: qty 1

## 2013-05-11 MED ORDER — METOPROLOL TARTRATE 1 MG/ML IV SOLN
5.0000 mg | INTRAVENOUS | Status: DC | PRN
  Administered 2013-05-11: 5 mg via INTRAVENOUS

## 2013-05-11 MED ORDER — AMIODARONE HCL 200 MG PO TABS
400.0000 mg | ORAL_TABLET | Freq: Two times a day (BID) | ORAL | Status: DC
  Administered 2013-05-11 – 2013-05-13 (×4): 400 mg via ORAL
  Filled 2013-05-11 (×5): qty 2

## 2013-05-11 NOTE — Progress Notes (Signed)
Echo from 11/20/12:  1. Left ventricular ejection fraction estimated by 2D at 50-55 percent. 2. Mild to moderate mitral valve regurgitation. 3. There is a bioprosthetic aortic valve which functions normally. 4. There is mild aortic root dilatation. 5. Dilated atria. 6. Mildly elevated estimated right ventricular systolic pressure. 7. No significant change compared to 2011 echocardiogram.

## 2013-05-11 NOTE — Progress Notes (Addendum)
    Subjective:  AFIB remained through day yesterday with this am again RVR.   Yesterday increased amio to 200 BID. No complaints.   Objective:  Vital Signs in the last 24 hours: Temp:  [97.3 F (36.3 C)-99.2 F (37.3 C)] 98.5 F (36.9 C) (11/23 0821) Pulse Rate:  [110-137] 110 (11/23 0821) Resp:  [21-33] 21 (11/23 0912) BP: (114-145)/(43-104) 114/59 mmHg (11/23 0912) SpO2:  [94 %-99 %] 94 % (11/23 0821) Weight:  [178 lb 1.6 oz (80.786 kg)] 178 lb 1.6 oz (80.786 kg) (11/23 0349)  Intake/Output from previous day: 11/22 0701 - 11/23 0700 In: 1240 [P.O.:840; IV Piggyback:400] Out: 2550 [Urine:2550]   Physical Exam: General: Well developed, well nourished, in no acute distress. Head:  Normocephalic and atraumatic. Lungs: Clear to auscultation and percussion. Mildly decreased at bases.  Heart: Irreg irreg (HR now 90-110).  No murmur, rubs or gallops.  Abdomen: soft, non-tender, positive bowel sounds. Extremities: No clubbing or cyanosis. No edema. Neurologic: Alert and oriented x 3. Skin: right upper ext. Improved erythema. Diaphoretic back.     Lab Results:  Recent Labs  05/10/13 1145  WBC 11.2*  HGB 12.7*  PLT 329    Recent Labs  05/10/13 1000 05/11/13 0450  NA 137 136  K 3.6 3.7  CL 100 98  CO2 27 26  GLUCOSE 147* 95  BUN 16 14  CREATININE 0.60 0.62    Imaging: Dg Chest 2 View  05/09/2013   CLINICAL DATA:  CTA chest dated 05/04/2013  EXAM: CHEST  2 VIEW  COMPARISON:  Abnormal breath sounds  FINDINGS: Moderate left pleural effusion, new. Pulmonary vascular congestion without frank interstitial edema. No pneumothorax.  Cardiomegaly.  Prosthetic aortic valve.  Median sternotomy.  Prominence of the aortic arch, likely related to known dissection.  IMPRESSION: Moderate left pleural effusion, new.  Prominence of the aortic arch, likely related to known dissection.   Electronically Signed   By: Charline Bills M.D.   On: 05/09/2013 11:43   Personally viewed.     Telemetry: AFIB variable rates, as high as 150.  Personally viewed.     Assessment/Plan:   89 with new Type B aortic dissection with afib RVR.   1) AFIB  - despite increased amio yesterday 200 BID he had issues with increased heart rate.  - This am gave him 5mg  IV metoprolol which seemed to work very well.   - I also increased amio to 400mg  BID to resume load.  - Now I will increase metoprolol as well from 50mg  BID to 50 mg Q6 (for a total of 200 per day).   - In review of prior notes, anticoagulation restart will be considered in about 2 weeks.    2) Aortic dissection  - No anticoagulation   - BP control  - Appreciate Dr. Tyrone Sage assistance.   3) Hypertension  - controlled currently. Watch for any hypotension with increased metoprolol.   - lasix 80mg  BID.  4) Prior type A dissection - Bent[place]. Bioprosthetic AV stable.   Discussed with family.   SKAINS, MARK 05/11/2013, 9:13 AM

## 2013-05-11 NOTE — Progress Notes (Signed)
Heart rate sustaining 120-140's Afib. Pt asymptomatic. Dr. Anne Fu notified. Order obtained for 5mg  IV lopressor and increased PO Amiodarone to 400mg  BID. Will continue to monitor.

## 2013-05-11 NOTE — Progress Notes (Addendum)
[facility] Progress Note [place] TEAM 1 - Stepdown/ICU TEAM   [name] [name] ZOX:[id number] DOB: [birth year] DOA: 05/04/2013 PCP: [name]., MD  Admit HPI / Brief Narrative: 72 year old male with prior aortic valve replacement who presented to [facility] with abdominal pain and back pain. The pain began spontaneously while the patient was watching football. It was severe and tearing in nature. It was reminiscent of previous pain he had when he was first found to have an aortic aneurysm many years ago. There was no associated fevers, chills, shortness of breath, nausea, or vomiting. He did feel a little lightheaded when the pain began.  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 - has been tx w/ labetalol gtt and now has been transitioned to oral meds  Atrial fibrillation w/ RVR - newly diagnosed 05/06/13 - not a candidate for anticoagulation at this time -rate management has been difficult- cardiology team managing  - adjusting medications again today - anticoagulation to be resumed in 2 wks after this initial date of admission per CT surgery-  was on Plavix at home  HTN  Well controlled at present/borderline hypotension - follow   Left Pleural effusion- 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%, Mod MR and aortic bioprosthesis (ECHO 2010) - Repeat EFin 6/14 was normal (see Sr Skain's note) - increased Lasix on 11/22 - has diuresed - Cardiology has titrated lasix down to 40 BID today- On room air with good O2 sats - repeat CXR tomorrow  Mild chronic anemia  Hgb is stable  Hypokalemia  Resolved  -replace - on Lasix  Right arm superficial venous thrombosis - due to infiltration of IV - warm compresses- Voltaren gel  Lactic acidosis  Level  not rechecked, but clinically suspect has resolved   Hyperglycemia   A1c 5.8 - no known hx of DM - possible simply due to IVF and stress response  CAD - BMS CFX 2012, RCA totaled  S/P bioprosthetic AoVR in 2010  Code Status: FULL Family Communication: dicussed with wife Disposition Plan:  SDU   Consultants: PCCM >> TR[facility] TCTS Cardiology  Procedures: none  Antibiotics: none  DVT prophylaxis: SCDs  HPI/Subjective: The patient is awake alert and sitting in a chair- right arm feeling somewhat better  Objective: Blood pressure 119/69, pulse 93, temperature 98.8 F (37.1 C), temperature source Oral, resp. rate 21, height 5\' 10"  (1.778 m), weight 80.786 kg (178 lb 1.6 oz), SpO2 96.00%.  Intake/Output Summary (Last 24 hours) at 05/11/13 1510 Last data filed at 05/11/13 1200  Gross per 24 hour  Intake    920 ml  Output   1450 ml  Net   -530 ml   Exam: General: No acute respiratory distress Lungs: Clear to auscultation bilaterally without wheezes or crackles- decreased breath sounds in LLL Cardiovascular: IIRR. HR 70-80s Abdomen: Nontender, nondistended, soft, bowel sounds positive, no rebound, no ascites, no appreciable mass Extremities: No significant cyanosis, clubbing, or edema bilateral lower extremities  Data Reviewed: Basic Metabolic Panel:  Recent Labs Lab 05/06/13 0355 05/08/13 0532 05/09/13 0417 05/10/13 1000 05/11/13 0450  NA 138 137 138 137 136  K 3.8 3.7 3.4* 3.6 3.7  CL 104 103 102 100 98  CO2 24 22 27 27 26   GLUCOSE 105* 109* 117* 147* 95  BUN 15 17 15  16  14  CREATININE 0.65 0.61 0.63 0.60 0.62  CALCIUM 8.6 8.5 8.1* 8.7 8.3*  MG  --  2.2  --   --   --    Liver Function Tests: No results found for this basename: AST, ALT, ALKPHOS, BILITOT, PROT, ALBUMIN,  in the last 168 hours No results found for this basename: LIPASE, AMYLASE,  in the last 168 hours CBC:  Recent Labs Lab 05/05/13 1130 05/06/13 0355 05/08/13 0532 05/10/13 1145  WBC 7.2  6.8 7.9 11.2*  HGB 10.3* 10.1* 11.3* 12.7*  HCT 32.3* 31.5* 34.3* 38.3*  MCV 95.8 95.2 93.7 93.2  PLT 148* 144* 209 329   BNP (last 3 results)  Recent Labs  05/04/13 0155  PROBNP 484.8*   CBG:  Recent Labs Lab 05/05/13 2332 05/06/13 0452 05/06/13 0812 05/09/13 0746 05/09/13 1228  GLUCAP 107* 107* 103* 119* 118*    Recent Results (from the past 240 hour(s))  MRSA PCR SCREENING     Status: None   Collection Time    05/04/13  6:07 AM      Result Value Range Status   MRSA by PCR NEGATIVE  NEGATIVE Final   Comment:            The GeneXpert MRSA Assay (FDA     approved for NASAL specimens     only), is one component of a     comprehensive MRSA colonization     surveillance program. It is not     intended to diagnose MRSA     infection nor to guide or     monitor treatment for     MRSA infections.     Studies:  Recent x-ray studies have been reviewed in detail by the Attending Physician  Scheduled Meds:  Scheduled Meds: . amiodarone  400 mg Oral BID  . diclofenac sodium  2 g Topical QID  . diltiazem  360 mg Oral Daily  . enalapril  5 mg Oral Daily  . furosemide  80 mg Oral BID  . metoprolol tartrate  50 mg Oral QID  . pantoprazole  40 mg Oral Daily  . potassium chloride  40 mEq Oral Daily  . simvastatin  10 mg Oral QHS  . sodium chloride  3 mL Intravenous Q12H    Time spent on care of this patient: 35 mins   Calvert Cantor, MD  [facility] Office  [phone] Pager - Text Page per Loretha Stapler as per below:  On-Call/Text Page:      [email or web address]      password [email or web address]  If 7PM-7AM, please contact night-coverage [email or web address] Password [email or web address] 05/11/2013, 3:10 PM   LOS: 7 days

## 2013-05-11 NOTE — Progress Notes (Signed)
Notified Md about pt hr 103-140's Ekg obtained  With noted Afib RVR at times.  Pt is asymptomatic at this time.  Will continue to monitor as discussed with Md.  Emilie [place] Liter

## 2013-05-12 ENCOUNTER — Inpatient Hospital Stay (HOSPITAL_COMMUNITY): Payer: Medicare Other

## 2013-05-12 ENCOUNTER — Encounter (HOSPITAL_COMMUNITY): Payer: Self-pay | Admitting: Interventional Cardiology

## 2013-05-12 DIAGNOSIS — I4892 Unspecified atrial flutter: Secondary | ICD-10-CM

## 2013-05-12 MED ORDER — DILTIAZEM HCL ER COATED BEADS 240 MG PO CP24
240.0000 mg | ORAL_CAPSULE | Freq: Two times a day (BID) | ORAL | Status: DC
  Administered 2013-05-12 – 2013-05-13 (×3): 240 mg via ORAL
  Filled 2013-05-12 (×4): qty 1

## 2013-05-12 MED ORDER — METOPROLOL TARTRATE 1 MG/ML IV SOLN
5.0000 mg | Freq: Once | INTRAVENOUS | Status: AC
  Administered 2013-05-12: 5 mg via INTRAVENOUS
  Filled 2013-05-12: qty 5

## 2013-05-12 NOTE — Progress Notes (Signed)
Physical Therapy Treatment Patient Details Name: [name] [name] MRN: [id number] DOB: [birth year] Today's Date: 05/12/2013 Time: 6578-4696 PT Time Calculation (min): 24 min  PT Assessment / Plan / Recommendation  History of Present Illness 72 year-old male with previous aortic dissection presenting with new type B aortic dissection. Stable now on medical therapy.   PT Comments   Patient eager to get out of bed and ambulate today so that he can get back home soon. He is aware of the need to be cautious against overexertion and was able to safely ambulate 110 feet with RW. He needed a reminder to take a short (<30sec) rest break during ambulation and was able to recover from HR of 155 immediately upon returning to his room. Goal of 3-5 stairs to be addressed as HR allows in order to assure more independence at home.  Follow Up Recommendations  Supervision - Intermittent;No PT follow up     Does the patient have the potential to tolerate intense rehabilitation     Barriers to Discharge        Equipment Recommendations  None recommended by PT    Recommendations for Other Services    Frequency Min 3X/week   Progress towards PT Goals Progress towards PT goals: Progressing toward goals  Plan Current plan remains appropriate    Precautions / Restrictions Precautions Precautions: Fall Restrictions Weight Bearing Restrictions: No   Pertinent Vitals/Pain Starting resting BP 90/54 HR 110 and fluctuating;HR increases 117-135 with sitting, HR with gait 130-155; after activity BP 102/83, HR 122 and fluctuation; reports no pain; tachycardia unsustained but present with all mobility and corrects with sitting rest; patient asymptomatic throughout     Mobility  Bed Mobility Bed Mobility: Rolling Right;Rolling Left;Right Sidelying to Sit;Sitting - Scoot to Edge of Bed Rolling Right: 7: Independent Rolling Left: 7: Independent Right Sidelying to Sit: 4: Min assist Sitting - Scoot to Edge of  Bed: 5: Supervision Details for Bed Mobility Assistance: needed HHA assist to elevate trunk from right sidelying to sitting Transfers Transfers: Sit to Stand;Stand to Sit Sit to Stand: From bed;5: Supervision Stand to Sit: To chair/3-in-1;With upper extremity assist;6: Modified independent (Device/Increase time) Details for Transfer Assistance: for safety Ambulation/Gait Ambulation/Gait Assistance: 5: Supervision Ambulation Distance (Feet): 110 Feet Assistive device: Rolling walker Ambulation/Gait Assistance Details: for safety Gait Pattern: Step-through pattern;Trunk flexed;Decreased stride length Gait velocity: slow Stairs: No    Exercises General Exercises - Lower Extremity Long Arc Quad: AROM;15 reps;Both;Seated Hip ABduction/ADduction: AROM;Both;15 reps;Seated Hip Flexion/Marching: AROM;Both;15 reps;Seated Toe Raises: AROM;Both;15 reps;Seated   PT Diagnosis:    PT Problem List:   PT Treatment Interventions:     PT Goals (current goals can now be found in the care plan section)    Visit Information  Last PT Received On: 05/12/13 Assistance Needed: +1 History of Present Illness: 72 year-old male with previous aortic dissection presenting with new type B aortic dissection. Stable now on medical therapy.    Subjective Data      Cognition  Cognition Arousal/Alertness: Awake/alert Behavior During Therapy: WFL for tasks assessed/performed Overall Cognitive Status: Within Functional Limits for tasks assessed    Balance     End of Session PT - End of Session Equipment Utilized During Treatment: Gait belt Activity Tolerance: Patient tolerated treatment well Patient left: in chair;with call bell/phone within reach;with family/visitor present Nurse Communication: Mobility status   GP     Willette Pa, SPT 05/12/2013, 1:01 PM

## 2013-05-12 NOTE — Progress Notes (Signed)
  Echocardiogram 2D Echocardiogram has been canceled per [name]. Please reorder if deemed necessary.  [name] [name] 05/12/2013, 10:20 AM

## 2013-05-12 NOTE — Progress Notes (Addendum)
Subjective:  AFIB remained through day yesterday with this am again RVR.   Two days ago, increased amio to 200 BID. No physical complaints.  Wants to go home.  Objective:  Vital Signs in the last 24 hours: Temp:  [98.1 F (36.7 C)-99.1 F (37.3 C)] 98.4 F (36.9 C) (11/24 0745) Pulse Rate:  [93-138] 138 (11/24 0951) Resp:  [16-22] 16 (11/24 0745) BP: (95-131)/(42-86) 95/42 mmHg (11/24 0952) SpO2:  [94 %-98 %] 96 % (11/24 0745) Weight:  [186 lb 1.1 oz (84.4 kg)] 186 lb 1.1 oz (84.4 kg) (11/24 0514)  Intake/Output from previous day: 11/23 0701 - 11/24 0700 In: 720 [P.O.:720] Out: 525 [Urine:525]   Physical Exam: General: Well developed, well nourished, in no acute distress. Head:  Normocephalic and atraumatic. Lungs: Clear to auscultation and percussion. Mildly decreased at bases.  Heart: Regular, tachycardic.  No murmur, rubs or gallops.  Abdomen: soft, non-tender, positive bowel sounds. Extremities: No clubbing or cyanosis. No edema. Neurologic: Alert and oriented x 3.      Lab Results:  Recent Labs  05/10/13 1145  WBC 11.2*  HGB 12.7*  PLT 329    Recent Labs  05/10/13 1000 05/11/13 0450  NA 137 136  K 3.6 3.7  CL 100 98  CO2 27 26  GLUCOSE 147* 95  BUN 16 14  CREATININE 0.60 0.62    Imaging: Dg Chest Port 1 View  05/12/2013   CLINICAL DATA:  Pleural effusion  EXAM: PORTABLE CHEST - 1 VIEW  COMPARISON:  Prior chest x-ray 05/09/2013  FINDINGS: Stable to slightly decreased layering left pleural effusion with associated dense a basilar opacity. Patient is status post median sternotomy with evidence of aortic valve replacement. Similar configuration and contour of the thoracic aorta in this patient with a known acute/ subacute thoracic aortic dissection. The right lung remains relatively clear. There is mild background vascular congestion without overt edema. No pneumothorax. No acute osseous abnormality.  IMPRESSION: 1. Stable to slightly decreased left  layering pleural effusion with associated dense left lower lobe opacity which is favored to reflect pleural fluid combined with atelectasis. 2. Similar aortic contour with focal bulging of the left aspect of the proximal descending thoracic aorta in the region of the patient's known acute/subacute thoracic aortic dissection.   Electronically Signed   By: Malachy Moan M.D.   On: 05/12/2013 07:44      Telemetry: AFlutter 140.  Personally viewed.     Assessment/Plan:   7 with new Type B aortic dissection with afib RVR.   1) AFIB  - despite increased amio  200 BID he had issues with increased heart rate. Now in atrial flutter 2:1 conduction.  -  5mg  IV metoprolol worked very well.   - I also increased amio to 400mg  BID to resume load.  - increased metoprolol as well from 50mg  BID to 50 mg Q6 (for a total of 200 per day).   - In review of prior notes, anticoagulation restart will be considered in about 2 weeks.  Change diltiazem to 240 mg BID.   2) Aortic dissection  - No anticoagulation   - BP control  - Appreciate Dr. Tyrone Sage assistance.   3) Hypertension  - controlled currently. Watch for any hypotension with increased metoprolol.   - lasix 80mg  BID.  4) Prior type A dissection - Bent[place]. Bioprosthetic AV stable.  Normal LV function in June 2014 by echo in our ofice.  Mild to moderate MR at that time.  Hospital echo cancelled due to that study and because HR is quite high.  Discussed with family.   [name] [name] S. 05/12/2013, 10:25 AM

## 2013-05-12 NOTE — Progress Notes (Signed)
[facility] Progress Note [place] TEAM 1 - Stepdown/ICU TEAM   [name] [name] WUJ:[id number] DOB: [birth year] DOA: 05/04/2013 PCP: [name]., MD  Admit HPI / Brief Narrative: 72 year old male with prior aortic valve replacement who presented to [facility] with abdominal pain and back pain. The pain began spontaneously while the patient was watching football. It was severe and tearing in nature. It was reminiscent of previous pain he had when he was first found to have an aortic aneurysm many years ago. There was no associated fevers, chills, shortness of breath, nausea, or vomiting. He did feel a little lightheaded when the pain began.  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 - has been tx w/ labetalol gtt and now has been transitioned to oral meds - blood pressure well controlled  Atrial fibrillation w/ RVR - newly diagnosed 05/06/13 - not a candidate for anticoagulation at this time -rate management has been difficult- Cardiology team managing  - adjusting medications again today - anticoagulation to be resumed in 2 wks after this initial date of admission per CT surgery-  was on Plavix at home  HTN  Well controlled at present/borderline hypotension - follow without change today  Left Pleural effusion - 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%, Mod MR and aortic bioprosthesis (ECHO 2010) - Repeat EF in 6/14 was normal (see Sr Skain's note) - increased Lasix on 11/22 - has diuresed - Cardiology has titrated lasix down to 40 BID - On room air with good O2 sats - repeat CXR reveals decreased size of effusion  Mild chronic anemia  Hgb is stable  Hypokalemia  Resolved   Right arm superficial venous thrombosis - due to infiltration of IV -  warm compresses - Voltaren gel  Hyperglycemia   A1c 5.8 - no known hx of DM - possible simply due to IVF and stress response  CAD - BMS CFX 2012, RCA totaled  S/P bioprosthetic AoVR in 2010  Code Status: FULL Family Communication: No family present at time of exam today Disposition Plan:  SDU   Consultants: PCCM >> [facility] TCTS Cardiology  Procedures: none  Antibiotics: none  DVT prophylaxis: SCDs  HPI/Subjective: The patient has no new complaints today.  Objective: Blood pressure 113/89, pulse 142, temperature 97.9 F (36.6 C), temperature source Oral, resp. rate 25, height 5\' 10"  (1.778 m), weight 84.4 kg (186 lb 1.1 oz), SpO2 96.00%.  Intake/Output Summary (Last 24 hours) at 05/12/13 1308 Last data filed at 05/12/13 1100  Gross per 24 hour  Intake    513 ml  Output    825 ml  Net   -312 ml   Exam: General: No acute respiratory distress Lungs: Clear to auscultation bilaterally without wheezes or crackles- decreased breath sounds in LLL Cardiovascular: Tachycardic in the 130 beats per minute at time of exam and irregular Abdomen: Nontender, nondistended, soft, bowel sounds positive, no rebound, no ascites, no appreciable mass Extremities: No significant cyanosis, clubbing, or edema bilateral lower extremities  Data Reviewed: Basic Metabolic Panel:  Recent Labs Lab 05/06/13 0355 05/08/13 0532 05/09/13 0417 05/10/13 1000 05/11/13 0450  NA 138 137 138 137 136  K 3.8 3.7 3.4* 3.6 3.7  CL 104 103 102 100 98  CO2 24 22 27 27 26   GLUCOSE 105* 109* 117* 147* 95  BUN 15 17 15 16 14   CREATININE 0.65 0.61 0.63 0.60 0.62  CALCIUM 8.6 8.5 8.1* 8.7 8.3*  MG  --  2.2  --   --   --    Liver Function Tests: No results found for this basename: AST, ALT, ALKPHOS, BILITOT, PROT, ALBUMIN,  in the last 168 hours  CBC:  Recent Labs Lab 05/06/13 0355 05/08/13 0532 05/10/13 1145  WBC 6.8 7.9 11.2*  HGB 10.1* 11.3* 12.7*  HCT 31.5* 34.3* 38.3*  MCV 95.2 93.7 93.2   PLT 144* 209 329   BNP (last 3 results)  Recent Labs  05/04/13 0155  PROBNP 484.8*   CBG:  Recent Labs Lab 05/05/13 2332 05/06/13 0452 05/06/13 0812 05/09/13 0746 05/09/13 1228  GLUCAP 107* 107* 103* 119* 118*    Recent Results (from the past 240 hour(s))  MRSA PCR SCREENING     Status: None   Collection Time    05/04/13  6:07 AM      Result Value Range Status   MRSA by PCR NEGATIVE  NEGATIVE Final   Comment:            The GeneXpert MRSA Assay (FDA     approved for NASAL specimens     only), is one component of a     comprehensive MRSA colonization     surveillance program. It is not     intended to diagnose MRSA     infection nor to guide or     monitor treatment for     MRSA infections.     Studies:  Recent x-ray studies have been reviewed in detail by the Attending Physician  Scheduled Meds:  Scheduled Meds: . amiodarone  400 mg Oral BID  . diclofenac sodium  2 g Topical QID  . diltiazem  240 mg Oral BID  . enalapril  5 mg Oral Daily  . furosemide  80 mg Oral BID  . metoprolol tartrate  50 mg Oral QID  . pantoprazole  40 mg Oral Daily  . potassium chloride  40 mEq Oral Daily  . simvastatin  10 mg Oral QHS  . sodium chloride  3 mL Intravenous Q12H    Time spent on care of this patient: 25 mins   [facility] T, MD  [facility] Office  [phone] Pager - Text Page per Loretha Stapler as per below:  On-Call/Text Page:      [email or web address]      password [email or web address]  If 7PM-7AM, please contact night-coverage [email or web address] Password [email or web address] 05/12/2013, 1:08 PM   LOS: 8 days

## 2013-05-12 NOTE — Progress Notes (Signed)
Seen and agree with SPT note Derrion Tritz Tabor Devery Murgia, PT [phone]  

## 2013-05-13 LAB — BASIC METABOLIC PANEL
BUN: 21 mg/dL (ref 6–23)
Chloride: 100 mEq/L (ref 96–112)
Creatinine, Ser: 0.75 mg/dL (ref 0.50–1.35)
GFR calc Af Amer: >90 mL/min (ref >90)
GFR calc non Af Amer: 89 mL/min — ABNORMAL LOW (ref >90)
Glucose, Bld: 99 mg/dL (ref 70–99)
Sodium: 138 mEq/L (ref 135–145)

## 2013-05-13 MED ORDER — POTASSIUM CHLORIDE CRYS ER 20 MEQ PO TBCR: 40.0000 meq | EXTENDED_RELEASE_TABLET | Freq: Every day | ORAL | Status: DC

## 2013-05-13 MED ORDER — METOPROLOL SUCCINATE ER 50 MG PO TB24: 50.0000 mg | ORAL_TABLET | Freq: Every day | ORAL | Status: DC

## 2013-05-13 MED ORDER — DILTIAZEM HCL ER COATED BEADS 240 MG PO CP24: 240.0000 mg | ORAL_CAPSULE | Freq: Two times a day (BID) | ORAL | Status: DC

## 2013-05-13 MED ORDER — METOPROLOL SUCCINATE ER 100 MG PO TB24: 100.0000 mg | ORAL_TABLET | Freq: Every day | ORAL | Status: DC

## 2013-05-13 MED ORDER — METOPROLOL TARTRATE 100 MG PO TABS: 100.0000 mg | ORAL_TABLET | Freq: Two times a day (BID) | ORAL | Status: DC

## 2013-05-13 MED ORDER — FUROSEMIDE 80 MG PO TABS: 80.0000 mg | ORAL_TABLET | Freq: Two times a day (BID) | ORAL | Status: DC

## 2013-05-13 MED ORDER — SIMVASTATIN 20 MG PO TABS: 10.0000 mg | ORAL_TABLET | Freq: Every day | ORAL | Status: DC

## 2013-05-13 MED ORDER — AMIODARONE HCL 400 MG PO TABS: 400.0000 mg | ORAL_TABLET | Freq: Every day | ORAL | Status: DC

## 2013-05-13 MED ORDER — POTASSIUM CHLORIDE CRYS ER 20 MEQ PO TBCR: 40.0000 meq | EXTENDED_RELEASE_TABLET | Freq: Once | ORAL | Status: DC

## 2013-05-13 MED ORDER — AMIODARONE HCL 400 MG PO TABS: 400.0000 mg | ORAL_TABLET | Freq: Two times a day (BID) | ORAL | Status: DC

## 2013-05-13 NOTE — Progress Notes (Signed)
PT Cancellation Note  Patient Details Name: [name] [name] MRN: [id number] DOB: 22-Dec-1940   Cancelled Treatment:    Reason Eval/Treat Not Completed: Medical issues which prohibited therapy (Pt with BP 143/73 supine and beyond parameters for mobility. Per RN SBP goal <110). Will attempt at later date and recommend mobility with nursing as medically able.   Toney Sang Beth 05/13/2013, 8:15 AM Delaney Meigs, PT [phone]

## 2013-05-13 NOTE — Progress Notes (Signed)
Subjective:  AFIB was much better controlled through day yesterday.  Heart rates consistently below 100 overnight. This morning, while awake, he is in the 90s to 110 range. He continues to be asymptomatic.   3 days ago, increased amio to 200 BID. No physical complaints.  Wants to go home.  Objective:  Vital Signs in the last 24 hours: Temp:  [97.9 F (36.6 C)-99.1 F (37.3 C)] 98.8 F (37.1 C) (11/25 0720) Pulse Rate:  [124-142] 124 (11/24 2129) Resp:  [18-25] 18 (11/25 0720) BP: (81-124)/(42-89) 124/55 mmHg (11/25 0720) SpO2:  [93 %-97 %] 96 % (11/25 0720) Weight:  [176 lb 2.4 oz (79.9 kg)] 176 lb 2.4 oz (79.9 kg) (11/25 0500)  Intake/Output from previous day: 11/24 0701 - 11/25 0700 In: 361 [P.O.:355; I.V.:6] Out: 825 [Urine:825]   Physical Exam: General: Well developed, well nourished, in no acute distress. Head:  Normocephalic and atraumatic. Lungs: Clear to auscultation and percussion. Mildly decreased at bases.  Heart: Regular, tachycardic.  No murmur, rubs or gallops.  Abdomen: soft, non-tender, positive bowel sounds. Extremities: No clubbing or cyanosis. No edema. Neurologic: Alert and oriented x 3.      Lab Results:  Recent Labs  05/10/13 1145  WBC 11.2*  HGB 12.7*  PLT 329    Recent Labs  05/11/13 0450 05/13/13 0549  NA 136 138  K 3.7 3.5  CL 98 100  CO2 26 25  GLUCOSE 95 99  BUN 14 21  CREATININE 0.62 0.75    Imaging: Dg Chest Port 1 View  05/12/2013   CLINICAL DATA:  Pleural effusion  EXAM: PORTABLE CHEST - 1 VIEW  COMPARISON:  Prior chest x-ray 05/09/2013  FINDINGS: Stable to slightly decreased layering left pleural effusion with associated dense a basilar opacity. Patient is status post median sternotomy with evidence of aortic valve replacement. Similar configuration and contour of the thoracic aorta in this patient with a known acute/ subacute thoracic aortic dissection. The right lung remains relatively clear. There is mild background  vascular congestion without overt edema. No pneumothorax. No acute osseous abnormality.  IMPRESSION: 1. Stable to slightly decreased left layering pleural effusion with associated dense left lower lobe opacity which is favored to reflect pleural fluid combined with atelectasis. 2. Similar aortic contour with focal bulging of the left aspect of the proximal descending thoracic aorta in the region of the patient's known acute/subacute thoracic aortic dissection.   Electronically Signed   By: Malachy Moan M.D.   On: 05/12/2013 07:44      Telemetry: Atrial fibrillation.  Personally viewed.     Assessment/Plan:   45 with new Type B aortic dissection with afib RVR.   1) AFIB- normal A., we would've performed cardioversion electrically. However, since he cannot get anticoagulation for another week due to his aortic dissection, we have been forced to manage with medication. His rate is finally controlled enough that I think he can be discharged.  Would recommend following cardiac medications:  Metoprolol tartrate 100 mg by mouth twice a day diltiazem CD 240 mg BID. Lasix 80 mg by mouth twice a day Amiodarone 400 mg by mouth daily Lisinopril 5 mg by mouth daily.  If he has low blood pressures at home with systolic below 90, would hold lisinopril for that day. We'll see him back in the office next week.  Start Eliquis Monday.     - anticoagulation restart will be considered next week.  Change diltiazem CD 240 mg BID.   2)  Aortic dissection  - No anticoagulation until Dec 1.  - BP control  - Appreciate Dr. Tyrone Sage assistance.   3) Hypertension  - controlled currently. Watch for any hypotension with increased metoprolol and diltiazem.   - lasix 80mg  BID.  4) Prior type A dissection - [place]. Bioprosthetic AV stable.  Normal LV function in June 2014 by echo in our ofice.  Mild to moderate MR at that time.  Hospital echo cancelled due to that study and because HR is quite  high.    Adelis Docter S. 05/13/2013, 9:47 AM

## 2013-05-13 NOTE — Progress Notes (Signed)
Patient discharged to home w/wife via wheelchair.  PIV d/c'd w/pressure dressing applied to site.  Telemetry also d/c'd CCMD and E-Link notified.  D/C instructions given w/all questions and concerns addressed.  Patient was awake, alert and oriented at time of discharge.

## 2013-05-13 NOTE — Discharge Summary (Signed)
Patient ID: [name] [name] MRN: [id number] DOB/AGE: [birth year] 72 y.o.  Admit date: 05/04/2013 Discharge date: 05/13/2013  Primary Discharge Diagnosis type B aortic dissection Secondary Discharge Diagnosis atrial fibrillation with rapid ventricular response, hypertension, coronary artery disease, atrial flutter  Significant Diagnostic Studies: radiology: CT scan: Partially thrombosed aortic dissection in the descending, thoracic aorta  Consults: Cardiac surgery-Dr. Hoyle Sauer Course: 1 history of a Type A dissection who had an episode of severe chest discomfort. It was very similar to what he had about 5 years ago at the time of his first dissection. He came to the emergency room. CT scan was performed with the above findings. Cardiac surgery consult was obtained and blood pressure management was recommended. His blood pressure was decreased. He was diuresis. His pain resolved. He ruled out for MI. Despite his history of coronary artery disease, the pain that he had was very atypical.  He was doing well until he went into atrial fibrillation with rapid ventricular response a couple days after admission. I discussed anticoagulation with cardiac surgery. Due to the recent dissection, it was recommended that anticoagulation be held for 2 weeks from the time of his dissection. There is no randomized controlled data to guide this decision. This was purely a clinical decision. We did opt for a novel oral anticoagulant to avoid a possible supratherapeutic INR from Coumadin. The plan was to start Eliquis on December 1. He was started on IV amiodarone. This was continued for several days. Unfortunately, he did not convert back to normal sinus rhythm. This was switched to oral amiodarone. He was started on a Cardizem drip. His rate did come down on 20 mg per hour of Cardizem. When this was switched to oral Cardizem, his rate went back up. His amiodarone dose was increased. He did convert to  sinus rhythm on Saturday, November 22 for a short period of time, but then reverted back to atrial fibrillation.  We then struggled to achieve rate control. The patient was quite frustrated because he felt fine and wasn't being discharged. At one point, he appear to be in atrial flutter with 2:1 conduction. His Lasix dose was increased due to the possibility of diastolic heart failure from the rapid ventricular response. Finally, on November 24 into November 25, his rate control improved significantly. He felt well. He was lying flat without any shortness of breath. He was pleading to go home. At that point, he seemed clinically stable to go home. He will have close followup next week. He will come to our office to get a prescription for his anticoagulation as well as some samples. I will see him the day after he starts this medication. His blood pressure has been controlled. Parameters have been given to hold lisinopril if his systolic blood pressure increases.  His home potassium dose was also increased.   Discharge Exam: Blood pressure 124/55, pulse 124, temperature 98.8 F (37.1 C), temperature source Oral, resp. rate 18, height 5\' 10"  (1.778 m), weight 176 lb 2.4 oz (79.9 kg), SpO2 96.00%.    General: Well developed, well nourished, in no acute distress.  Head: Normocephalic and atraumatic.  Lungs: Clear to auscultation and percussion. Mildly decreased at bases.  Heart: irRegular, . Normal rate  Abdomen: soft, non-tender, positive bowel sounds.  Extremities: No clubbing or cyanosis. No edema.  Neurologic: Alert and oriented x 3.  Labs:   Lab Results  Component Value Date   WBC 11.2* 05/10/2013   HGB 12.7* 05/10/2013   HCT  38.3* 05/10/2013   MCV 93.2 05/10/2013   PLT 329 05/10/2013    Recent Labs Lab 05/13/13 0549  NA 138  K 3.5  CL 100  CO2 25  BUN 21  CREATININE 0.75  CALCIUM 8.4  GLUCOSE 99   Lab Results  Component Value Date   CKTOTAL 102 07/29/2010   CKMB 3.5 07/29/2010    TROPONINI  Value: 0.19        PERSISTENTLY INCREASED TROPONIN VALUES IN THE RANGE OF 0.06-0.49 ng/mL CAN BE SEEN IN:       -UNSTABLE ANGINA       -CONGESTIVE HEART FAILURE       -MYOCARDITIS       -CHEST TRAUMA       -ARRYHTHMIAS       -LATE PRESENTING MI       -COPD   CLINICAL FOLLOW-UP RECOMMENDED.* 07/29/2010    Lab Results  Component Value Date   CHOL  Value: 136        ATP III CLASSIFICATION:  <200     mg/dL   Desirable  161-096  mg/dL   Borderline High  >=045    mg/dL   High        09/25/8117   Lab Results  Component Value Date   HDL 38* 07/29/2010   Lab Results  Component Value Date   LDLCALC  Value: 80        Total Cholesterol/HDL:CHD Risk Coronary Heart Disease Risk Table                     Men   Women  1/2 Average Risk   3.4   3.3  Average Risk       5.0   4.4  2 X Average Risk   9.6   7.1  3 X Average Risk  23.4   11.0        Use the calculated Patient Ratio above and the CHD Risk Table to determine the patient's CHD Risk.        ATP III CLASSIFICATION (LDL):  <100     mg/dL   Optimal  147-829  mg/dL   Near or Above                    Optimal  130-159  mg/dL   Borderline  562-130  mg/dL   High  >865     mg/dL   Very High 7/84/6962   Lab Results  Component Value Date   TRIG 89 07/29/2010   Lab Results  Component Value Date   CHOLHDL 3.6 07/29/2010   No results found for this basename: LDLDIRECT      Radiology: CT scan as above EKG: Atrial fibrillation with rapid ventricular response  FOLLOW UP PLANS AND APPOINTMENTS  Future Appointments Provider Department Dept Phone   06/26/2013 2:30 PM Delight Ovens, MD [facility] [phone]       Medication List    STOP taking these medications       amLODipine 5 MG tablet  Commonly known as:  NORVASC     clopidogrel 75 MG tablet  Commonly known as:  PLAVIX     metoprolol succinate 50 MG 24 hr tablet  Commonly known as:  TOPROL-XL      TAKE these medications       amiodarone  400 MG tablet  Commonly known as:  PACERONE  Take 1 tablet (400 mg total) by mouth daily.  aspirin EC 81 MG tablet  Take 81 mg by mouth daily.     diltiazem 240 MG 24 hr capsule  Commonly known as:  CARDIZEM CD  Take 1 capsule (240 mg total) by mouth 2 (two) times daily.     enalapril 5 MG tablet  Commonly known as:  VASOTEC  Take 5 mg by mouth daily.     furosemide 80 MG tablet  Commonly known as:  LASIX  Take 1 tablet (80 mg total) by mouth 2 (two) times daily.     lansoprazole 30 MG capsule  Commonly known as:  PREVACID  Take 30 mg by mouth daily.     loratadine 10 MG tablet  Commonly known as:  CLARITIN  Take 10 mg by mouth daily.     metoprolol 100 MG tablet  Commonly known as:  LOPRESSOR  Take 1 tablet (100 mg total) by mouth 2 (two) times daily.     nitroGLYCERIN 0.4 MG SL tablet  Commonly known as:  NITROSTAT  Place 0.4 mg under the tongue every 5 (five) minutes as needed.     potassium chloride SA 20 MEQ tablet  Commonly known as:  K-DUR,KLOR-CON  Take 2 tablets (40 mEq total) by mouth daily.     simvastatin 20 MG tablet  Commonly known as:  ZOCOR  Take 0.5 tablets (10 mg total) by mouth at bedtime. Can take half of 20 mg tab           Follow-up Information   Follow up with [name]., MD On 05/20/2013. (office to call with time.)    Specialty:  Cardiology   Contact information:   [address] [place] [address] [phone]       BRING ALL MEDICATIONS WITH YOU TO FOLLOW UP APPOINTMENTS  Time spent with patient to include physician time:  40 minutes going over medication changes and arranging for his anticoagulation and followup. SignedCorky Crafts. 05/13/2013, 10:15 AM

## 2013-05-20 ENCOUNTER — Encounter: Payer: Self-pay | Admitting: Interventional Cardiology

## 2013-05-20 ENCOUNTER — Ambulatory Visit (INDEPENDENT_AMBULATORY_CARE_PROVIDER_SITE_OTHER): Payer: Medicare Other | Admitting: Interventional Cardiology

## 2013-05-20 VITALS — BP 139/61 | HR 63 | Wt 176.0 lb

## 2013-05-20 DIAGNOSIS — I4891 Unspecified atrial fibrillation: Secondary | ICD-10-CM | POA: Diagnosis not present

## 2013-05-20 DIAGNOSIS — E785 Hyperlipidemia, unspecified: Secondary | ICD-10-CM | POA: Diagnosis not present

## 2013-05-20 DIAGNOSIS — I2510 Atherosclerotic heart disease of native coronary artery without angina pectoris: Secondary | ICD-10-CM

## 2013-05-20 DIAGNOSIS — Z79899 Other long term (current) drug therapy: Secondary | ICD-10-CM | POA: Diagnosis not present

## 2013-05-20 DIAGNOSIS — I7100 Dissection of unspecified site of aorta: Secondary | ICD-10-CM

## 2013-05-20 LAB — BASIC METABOLIC PANEL
BUN: 16 mg/dL (ref 6–23)
CO2: 28 mEq/L (ref 19–32)
Calcium: 9 mg/dL (ref 8.4–10.5)
Creatinine, Ser: 1.1 mg/dL (ref 0.4–1.5)
Potassium: 4.2 mEq/L (ref 3.5–5.1)

## 2013-05-20 MED ORDER — ATORVASTATIN CALCIUM 10 MG PO TABS: 10.0000 mg | ORAL_TABLET | Freq: Every day | ORAL | Status: DC

## 2013-05-20 NOTE — Patient Instructions (Signed)
Your physician recommends that you schedule a follow-up appointment in: 3 months with [name].  Your physician recommends that you return for lab work in 3 months on the same day as appt with [name] fasting for lipid and alt.  You will go to the lab today to have a Bmet drawn.  Your physician has recommended you make the following change in your medication:   1. Stop Simvastatin.  2. Start Atorvastatin 10 mg 1 tablet by mouth daily.

## 2013-05-20 NOTE — Progress Notes (Signed)
Patient ID: [name] [name], male   DOB: 1940/11/23, 72 y.o.   MRN: [id number] `                     [address] [place], [place]  [address] Phone: [phone] Fax:  [phone]  Date:  05/20/2013   ID:  [name] [name], [name] [name] November 21, 1940, MRN [id number]  PCP:  [name]., MD      History of Present Illness: [name] [name] is a 72 y.o. male who had a stent placed in 2/12. He had an aortic root replacement in 2010. He has been doing well, until a few weeks ago when he had severe chest discomfort. He was found to have a type B aortic dissection. This was managed with blood pressure control. He had atrial fibrillation in the hospital. He was started on amiodarone and Eliquis after 2 weeks from the dissection. No bleeding issues since being on the Eliquis. Blood pressure has been well controlled. His pulse has returned to normal. He is a little bit weak but feels like he is getting better. No constipation on the high-dose diltiazem.  CAD/ASCVD:  Denies : Dizziness.  Dyspnea on exertion.  Leg edema.  Nitroglycerin.  Palpitations.  Syncope.  Doing a regular walk. No sx with that.    Wt Readings from Last 3 Encounters:  05/20/13 176 lb (79.833 kg)  05/13/13 176 lb 2.4 oz (79.9 kg)  05/30/12 192 lb (87.091 kg)     Past Medical History  Diagnosis Date  . Hyperlipidemia   . DVT (deep venous thrombosis)     post knee surgery  . Tobacco abuse   . HTN (hypertension)   . Descending aortic aneurysm   . Aortic valve insufficiency   . Thoracic aneurysm   . S/P AVR (aortic valve replacement)   . CAD (coronary artery disease)   . Atrial flutter     Current Outpatient Prescriptions  Medication Sig Dispense Refill  . amiodarone (PACERONE) 400 MG tablet Take 1 tablet (400 mg total) by mouth daily.  60 tablet  3  . apixaban (ELIQUIS) 5 MG TABS tablet Take 5 mg by mouth 2 (two) times daily.      Marland Kitchen aspirin EC 81 MG tablet Take 81 mg by mouth daily.        Marland Kitchen  diltiazem (CARDIZEM CD) 240 MG 24 hr capsule Take 1 capsule (240 mg total) by mouth 2 (two) times daily.  60 capsule  11  . enalapril (VASOTEC) 5 MG tablet Take 5 mg by mouth daily.        . furosemide (LASIX) 80 MG tablet Take 1 tablet (80 mg total) by mouth 2 (two) times daily.  60 tablet  11  . lansoprazole (PREVACID) 30 MG capsule Take 30 mg by mouth daily.       Marland Kitchen loratadine (CLARITIN) 10 MG tablet Take 10 mg by mouth daily.        . metoprolol (LOPRESSOR) 100 MG tablet Take 1 tablet (100 mg total) by mouth 2 (two) times daily.  60 tablet  7  . nitroGLYCERIN (NITROSTAT) 0.4 MG SL tablet Place 0.4 mg under the tongue every 5 (five) minutes as needed.        . potassium chloride SA (K-DUR,KLOR-CON) 20 MEQ tablet Take 2 tablets (40 mEq total) by mouth daily.  60 tablet  11  . simvastatin (ZOCOR) 20 MG tablet Take 0.5 tablets (10 mg total) by mouth at bedtime.  Can take half of 20 mg tab  30 tablet     No current facility-administered medications for this visit.    Allergies:    Allergies  Allergen Reactions  . Lortab [Hydrocodone-Acetaminophen] Hives  . Morphine And Related Hives    Social History:  The patient  reports that he quit smoking about 39 years ago. His smoking use included Cigarettes. He smoked 0.00 packs per day. He has never used smokeless tobacco. He reports that he does not drink alcohol or use illicit drugs.   Family History:  The patient's family history is not on file.   ROS:  Please see the history of present illness.  No nausea, vomiting.  No fevers, chills.  No focal weakness.  No dysuria. Generalized weakness.  All other systems reviewed and negative.   PHYSICAL EXAM: VS:  BP 139/61  Pulse 63  Wt 176 lb (79.833 kg) Well nourished, well developed, in no acute distress HEENT: normal Neck: no JVD, no carotid bruits Cardiac:  normal S1, S2; RRR; 2/6 systolic murmur Lungs:  clear to auscultation bilaterally, no wheezing, rhonchi or rales Abd: soft, nontender, no  hepatomegaly Ext: no edema Skin: warm and dry Neuro:   no focal abnormalities noted      ASSESSMENT AND PLAN:  Coronary atherosclerosis of native coronary artery  Refill K-Dur Tablet, 20 MEQ, TAKE ONE TABLET BY MOUTH EVERY DAY, once a day, 90 days, 90, Refills 3 Refill Norvasc Tablet, 5 MG, 1 tablet, Orally, Once a day, 90 days, 90, Refills 3 Refill Metoprolol Tartrate Tablet, 50, TAKE ONE TABLET BY MOUTH TWICE DAILY, 90 days, 180, Refills 3 Notes: No angina.    2. Elevated triglycerides with high cholesterol  Continue Zocor Tablet, 20 MG, 1 tablet every evening, Orally, Once a day Notes: LDL 80, HDL 51 at most recent check. recent labs reviewed from [facility].    3. Shortness of breath   Notes: Stable. Known moderate MR. Okay to increase lasix dose if SHOB persists. He can titrate the dose based on weight, swelling and SHOB.    4. Mitral Regurgitation   has never been severe. No current symptoms of CHF.   5.  aortic dissection: Continue aggressive blood pressure control. Blood pressure is very well controlled. 6. atrial fibrillation: Continue anticoagulation for stroke prevention. Continue amiodarone for rhythm control. No signs of bradycardia. Procedures  Venipuncture:  Venipuncture: [name] [name],[name] [name] 11/06/2012 11:49:50 AM > , performed in right arm.        Labs    Lab: Basic Metabolic  GLUCOSE 96  70-99 - mg/dL  BUN 15  1-61 - mg/dL  CREATININE 0.96  0.45-4.09 - mg/dl  eGFR (NON-AFRICAN AMERICAN) 92  >60 - calc  eGFR (AFRICAN AMERICAN) 111  >60 - calc  SODIUM 141  136-145 - mmol/L  POTASSIUM 4.3  3.5-5.5 - mmol/L  CHLORIDE 102  98-107 - mmol/L  C02 31  22-32 - mmol/L  ANION GAP 11.7  6.0-20.0 - mmol/L  CALCIUM 9.6  8.6-10.3 - mg/dL   [name] [name],[name] [name] 81/19/1478 01:41:56 PM > stable renal function [name] [name],[name] [name] 11/06/2012 02:32:11 PM > pts wife notified per dpr.        Lab: BNP  B-NATRIURETIC PEPTIDE 311 H 0-100 - pg/mL   [name] [name],[name] [name] 11/06/2012 02:57:23 PM >  slightly increased still. await echocardiogram. increase to lasix 80 mg daily all 7 days of the week. [name] [name],[name] [name] 11/06/2012 02:59:38 PM > Pts notified per dpr. Meds updated.     Signed, Fredric Mare, MD, [facility] 05/20/2013  2:36 PM

## 2013-05-21 DIAGNOSIS — I4891 Unspecified atrial fibrillation: Secondary | ICD-10-CM | POA: Insufficient documentation

## 2013-05-21 HISTORY — DX: Unspecified atrial fibrillation: I48.91

## 2013-05-22 ENCOUNTER — Encounter: Payer: Self-pay | Admitting: Cardiology

## 2013-06-26 ENCOUNTER — Ambulatory Visit (INDEPENDENT_AMBULATORY_CARE_PROVIDER_SITE_OTHER): Payer: Medicare Other | Admitting: Cardiothoracic Surgery

## 2013-06-26 ENCOUNTER — Encounter: Payer: Self-pay | Admitting: Cardiothoracic Surgery

## 2013-06-26 VITALS — BP 130/66 | HR 57 | Resp 20 | Ht 70.0 in | Wt 176.0 lb

## 2013-06-26 DIAGNOSIS — I712 Thoracic aortic aneurysm, without rupture, unspecified: Secondary | ICD-10-CM | POA: Diagnosis not present

## 2013-06-26 DIAGNOSIS — R911 Solitary pulmonary nodule: Secondary | ICD-10-CM | POA: Diagnosis not present

## 2013-06-26 DIAGNOSIS — I719 Aortic aneurysm of unspecified site, without rupture: Secondary | ICD-10-CM

## 2013-06-26 NOTE — Progress Notes (Signed)
Patient ID: [name] [name], male   DOB: Dec 25, 1940, 73 y.o.   MRN: [id number]          RALPH HERMOSO [facility] Medical Record #[id number] Date of Birth: 1940-06-22  Referring: Everette Rank, MD Primary Care: [name]., MD  Chief Complaint:    Chief Complaint  Patient presents with  . Thoracic Aortic Aneurysm    1 year f/u with CTA Chest    History of Present Illness:     Patient is a 73 year old male who on 02/19/2009 presented with a large ascending aneurysm and acute aortic insufficiency and aortic arch aneurysm and at that time underwent aortic valve replacement replacement of aortic root and ascending aorta and replacement of aortic arch to the left subclavian artery. Since that time he's had an acute myocardial infarction and had a stent placed. In November 2014 prior to his yearly followup [facility] with an acute type III aortic dissection. He was treated medically, there is hospital stay he did develop persistent atrial fibrillation and ultimately was discharged home on anticoagulation. He currently is doing reasonably well has some fatigue no chest pain no significant symptoms of heart failure. He was confused about his discharge instruction concerning aspirin, since discharge he has not been taking it although it is listed as one of his medications.   Current Activity/ Functional Status: Mobility/Ambulation: Independent with mobility prior to admission in the home on all surfaces with no assistive devices for unlimited distances.   Zubrod Score: At the time of surgery this patient's most appropriate activity status/level should be described as: []  Normal activity, no symptoms []  Symptoms, fully ambulatory [x]  Symptoms, in bed less than or equal to 50% of the time []  Symptoms, in bed greater than 50% of the time but less than 100% []  Bedridden []  Moribund   Past Medical History  Diagnosis Date  . Hyperlipidemia   . DVT  (deep venous thrombosis)     post knee surgery  . Tobacco abuse   . HTN (hypertension)   . Descending aortic aneurysm   . Aortic valve insufficiency   . Thoracic aneurysm   . S/P AVR (aortic valve replacement)   . CAD (coronary artery disease)   . Atrial flutter     Past Surgical History  Procedure Laterality Date  . Right groin lymphocele  [id number]  . Aortic valve replacement  [id number]  . Replacement total knee      bilateral    History  Smoking status  . Former Smoker  . Types: Cigarettes  . Quit date: 04/16/1974  Smokeless tobacco  . Never Used   History  Alcohol Use No      Allergies  Allergen Reactions  . Lortab [Hydrocodone-Acetaminophen] Hives  . Morphine And Related Hives    Current Outpatient Prescriptions  Medication Sig Dispense Refill  . amiodarone (PACERONE) 400 MG tablet Take 1 tablet (400 mg total) by mouth daily.  60 tablet  3  . apixaban (ELIQUIS) 5 MG TABS tablet Take 5 mg by mouth 2 (two) times daily.      Marland Kitchen atorvastatin (LIPITOR) 10 MG tablet Take 1 tablet (10 mg total) by mouth daily.  90 tablet  3  . diltiazem (CARDIZEM CD) 240 MG 24 hr capsule Take 1 capsule (240 mg total) by mouth 2 (two) times daily.  60 capsule  11  . enalapril (VASOTEC) 5 MG tablet Take 5 mg by mouth daily.        Marland Kitchen  furosemide (LASIX) 80 MG tablet Take 1 tablet (80 mg total) by mouth 2 (two) times daily.  60 tablet  11  . lansoprazole (PREVACID) 30 MG capsule Take 30 mg by mouth daily.       Marland Kitchen loratadine (CLARITIN) 10 MG tablet Take 10 mg by mouth daily.        . metoprolol (LOPRESSOR) 100 MG tablet Take 1 tablet (100 mg total) by mouth 2 (two) times daily.  60 tablet  7  . nitroGLYCERIN (NITROSTAT) 0.4 MG SL tablet Place 0.4 mg under the tongue every 5 (five) minutes as needed.        . potassium chloride SA (K-DUR,KLOR-CON) 20 MEQ tablet Take 2 tablets (40 mEq total) by mouth daily.  60 tablet  11  . aspirin EC 81 MG tablet Take 81 mg by mouth daily.         No  current facility-administered medications for this visit.       Review of Systems:    Review of Systems  Constitutional: Nofever and malaise/fatigue. Negative for chills and weight loss.  HENT: Positive for nosebleeds. In the past but since discharge in November has had no further nosebleeds  Eyes: Negative.   Respiratory: Negative for hemoptysis, sputum production and wheezing.   Cardiovascular: Positive for palpitations and leg swelling. Negative for chest pain, orthopnea, claudication and PND.  Gastrointestinal: Negative.   Genitourinary: Negative.   Musculoskeletal: Positive for myalgias and joint pain.  Skin: Negative.   Neurological: Positive for dizziness and weakness. Negative for tingling, tremors, sensory change, speech change, focal weakness, seizures and loss of consciousness.  Endo/Heme/Allergies: Negative.   Psychiatric/Behavioral: Negative.     Physical Exam: BP 130/66  Pulse 57  Resp 20  Ht 5\' 10"  (1.778 m)  Wt 176 lb (79.833 kg)  BMI 25.25 kg/m2  SpO2 98%  General appearance: alert, cooperative, appears older than stated age, fatigued, no distress and slowed mentation Neurologic: intact Heart: regular rate and rhythm, S1, S2 normal, no murmur, click, rub or gallop, normal apical impulse, no click and no rub Lungs: clear to auscultation bilaterally Abdomen: soft, non-tender; bowel sounds normal; no masses,  no organomegaly Extremities: extremities normal, atraumatic, no cyanosis or edema, no edema, redness or tenderness in the calves or thighs and no ulcers, gangrene or trophic changes Wound: sternum stable He does not appear to be in atrial fibrillation on exam today  Diagnostic Studies & Laboratory data:     Recent Radiology Findings:   CLINICAL DATA: Chest pain radiating to the back.  EXAM:  CT ANGIOGRAPHY CHEST WITH CONTRAST  TECHNIQUE:  Multidetector CT imaging of the chest was performed using the  standard protocol during bolus administration of  intravenous  contrast. Multiplanar CT image reconstructions including MIPs were  obtained to evaluate the vascular anatomy.  CONTRAST: OMNIPAQUE IOHEXOL 350 MG/ML SOLN  COMPARISON: CTA of the chest performed 05/30/2012  FINDINGS:  The thoracic aorta is dilated to 4.5 cm in maximal diameter, perhaps  slightly more prominent than on the prior study. There is a new  dissection flap arising from the distal aspect of the aortic arch,  with a single fenestration at the proximal aspect of the dissection,  and decreased enhancement of blood within the false lumen. The  distal aspect of the false lumen appears to be fully clotted; the  false lumen resolves just proximal to the level of the diaphragm.  This causes mild luminal narrowing along the descending thoracic  aorta.  The  patient is status post ascending aortic repair; postoperative  change is unremarkable in appearance. The great vessels are  unremarkable in appearance. There is no evidence of pulmonary  embolus.  Mild left basilar atelectasis is noted. Minimal emphysematous change  is noted at the lung apices. The lungs are otherwise clear. There is  no evidence of pleural effusion or pneumothorax. No masses are  identified.  No mediastinal lymphadenopathy is appreciated. No pericardial  effusion is seen. The patient is status post median sternotomy. No  axillary lymphadenopathy is seen. The thyroid gland is unremarkable  in appearance.  The visualized portions of the liver and spleen are unremarkable. A  3.2 cm cyst is noted at the upper pole of the right kidney.  No acute osseous abnormalities are seen. Mild degenerative change is  noted at the lower cervical spine.  Review of the MIP images confirms the above findings.  IMPRESSION:  1. New dissection flap arising from the distal aspect of the aortic  arch, extending along the descending thoracic aorta and resolving  just proximal to the level of the diaphragm. No evidence  of  involvement of branch vessels. The distal aspect of the false lumen  appears to be fully clotted, while there is decreased enhancement of  blood within the more proximal false lumen, with a single  fenestration noted at the proximal aspect of the dissection.  Associated mild luminal narrowing along the descending thoracic  aorta. The thoracic aorta measures 4.5 cm in maximal diameter,  perhaps slightly more prominent than on the prior study.  2. Ascending thoracic aorta repair is unremarkable in appearance.  3. No evidence of pulmonary embolus.  4. Mild left basilar atelectasis noted; minimal emphysematous change  at the lung apices. Lungs otherwise clear.  5. Right renal cyst noted.  These results were called by telephone at the time of interpretation  on 05/04/2013 at 4:10 AM to Dr. Brandt LoosenJULIE MANLY , who verbally  acknowledged these results.  Electronically Signed  By: Roanna RaiderJeffery Chang M.D.  On: 05/04/2013 04:27   Recent Lab Findings: Lab Results  Component Value Date   WBC 11.2* 05/10/2013   HGB 12.7* 05/10/2013   HCT 38.3* 05/10/2013   PLT 329 05/10/2013   GLUCOSE 103* 05/20/2013   CHOL  Value: 136        ATP III CLASSIFICATION:  <200     mg/dL   Desirable  409-811200-239  mg/dL   Borderline High  >=914>=240    mg/dL   High        7/82/95622/03/2011   TRIG 89 07/29/2010   HDL 38* 07/29/2010   LDLCALC  Value: 80        Total Cholesterol/HDL:CHD Risk Coronary Heart Disease Risk Table                     Men   Women  1/2 Average Risk   3.4   3.3  Average Risk       5.0   4.4  2 X Average Risk   9.6   7.1  3 X Average Risk  23.4   11.0        Use the calculated Patient Ratio above and the CHD Risk Table to determine the patient's CHD Risk.        ATP III CLASSIFICATION (LDL):  <100     mg/dL   Optimal  130-865100-129  mg/dL   Near or Above  Optimal  130-159  mg/dL   Borderline  295-621  mg/dL   High  >308     mg/dL   Very High 6/57/8469   ALT 12 05/04/2013   AST 23 05/04/2013   NA 137 05/20/2013    K 4.2 05/20/2013   CL 100 05/20/2013   CREATININE 1.1 05/20/2013   BUN 16 05/20/2013   CO2 28 05/20/2013   TSH 1.102 05/08/2013   INR 1.15 05/04/2013   HGBA1C 5.8* 05/08/2013      Assessment / Plan:      The patient returns today for his regular yearly follow up,  CT scan of the chest as noted above was done demonstrating an acute type III aortic dissection in November. There is no significant change in the size of his repaired ascending aorta or in the questionable areas in the right upper lobe of lung since scan one year ago. Plan to see him back in in 4-5 weeks , which will be 3 months after his acute aortic type III dissection with a followup CT scan of the chest abdomen and pelvis to evaluate his descending thoracic aorta.   Delight Ovens MD 06/26/2013 2:57 PM

## 2013-07-01 ENCOUNTER — Telehealth: Payer: Self-pay | Admitting: Interventional Cardiology

## 2013-07-01 NOTE — Telephone Encounter (Signed)
Called pts wife sue, no answer.

## 2013-07-01 NOTE — Telephone Encounter (Signed)
New message     Talk to you about FMLA papers left days ago

## 2013-07-02 NOTE — Telephone Encounter (Signed)
No answer

## 2013-07-02 NOTE — Telephone Encounter (Signed)
FMLA form was filled out and faxed abut a month ago.

## 2013-07-11 NOTE — Telephone Encounter (Signed)
Spoke with pts wife and I let her know we did fill out form (then it was put in the to be faxed box) but she states the company never got it. I looked through pts chart and I have checked with medical records and form has not been found. It is not in pts chart either. She states she called medical records one day and they had the form and then the next time there was no form to be found. Pts wifes claim was denied. I told pt I was very sorry that this happened and if they send me back another form we can try to send it in again to see if it can be appealed. Pt states she will look into if further and hung the phone up on me in frustration. Pt was polite as possible considering the circumstances and I apologized continuously. Sending to Almira Coaster to review and make aware.

## 2013-07-18 ENCOUNTER — Telehealth: Payer: Self-pay | Admitting: *Deleted

## 2013-07-18 MED ORDER — FUROSEMIDE 40 MG PO TABS: 40.0000 mg | ORAL_TABLET | Freq: Two times a day (BID) | ORAL | Status: DC

## 2013-07-18 NOTE — Telephone Encounter (Signed)
Spoke with pt and he states he has taken lasix 40 mg 1 tablet twice a day since being discharged from the hospital. He just found out that he does have an rx on file at the pharmacy for lasix 80mg . I told pt I would refill the lasix 40 mg BID since he has been taking this dosage for quite sometimes and I will check to see if [name] wants him to increase lasix to 80 mg BID. Please advise.

## 2013-07-18 NOTE — Telephone Encounter (Signed)
Patients wife called for lasix refill. She stated that he takes 40mg  bid, but his office note has 80mg  bid and it looks like this was changed when he was in the hospital in November. Please advise on how this should be filled as the patient is out. Thanks, MI

## 2013-07-19 NOTE — Telephone Encounter (Signed)
Ok to stay on same dose if he is doing well.  If more swelling or SHOB, may have to increase to 80 mg BID.

## 2013-07-21 ENCOUNTER — Other Ambulatory Visit: Payer: Self-pay

## 2013-07-21 DIAGNOSIS — I719 Aortic aneurysm of unspecified site, without rupture: Secondary | ICD-10-CM

## 2013-07-21 NOTE — Telephone Encounter (Signed)
[name] wife notified.

## 2013-07-25 ENCOUNTER — Telehealth: Payer: Self-pay | Admitting: Interventional Cardiology

## 2013-07-25 NOTE — Telephone Encounter (Signed)
[place]/FMLA Completed For [employer] Wife aware signed & Completed, mailed to Home address Of Pt 2.6.15/kdm

## 2013-07-29 DIAGNOSIS — I712 Thoracic aortic aneurysm, without rupture, unspecified: Secondary | ICD-10-CM | POA: Diagnosis not present

## 2013-07-29 DIAGNOSIS — R05 Cough: Secondary | ICD-10-CM | POA: Diagnosis not present

## 2013-07-29 DIAGNOSIS — I10 Essential (primary) hypertension: Secondary | ICD-10-CM | POA: Diagnosis not present

## 2013-07-29 DIAGNOSIS — R059 Cough, unspecified: Secondary | ICD-10-CM | POA: Diagnosis not present

## 2013-08-07 DIAGNOSIS — I716 Thoracoabdominal aortic aneurysm, without rupture, unspecified: Secondary | ICD-10-CM | POA: Diagnosis not present

## 2013-08-12 DIAGNOSIS — J209 Acute bronchitis, unspecified: Secondary | ICD-10-CM | POA: Diagnosis not present

## 2013-08-12 DIAGNOSIS — J0100 Acute maxillary sinusitis, unspecified: Secondary | ICD-10-CM | POA: Diagnosis not present

## 2013-08-14 ENCOUNTER — Ambulatory Visit: Payer: Medicare Other | Admitting: Cardiothoracic Surgery

## 2013-08-14 ENCOUNTER — Other Ambulatory Visit: Payer: Medicare Other

## 2013-08-14 ENCOUNTER — Inpatient Hospital Stay: Admission: RE | Admit: 2013-08-14 | Payer: Medicare Other | Source: Ambulatory Visit

## 2013-08-18 DIAGNOSIS — R0602 Shortness of breath: Secondary | ICD-10-CM | POA: Diagnosis not present

## 2013-08-18 DIAGNOSIS — D649 Anemia, unspecified: Secondary | ICD-10-CM | POA: Diagnosis not present

## 2013-08-18 DIAGNOSIS — I77819 Aortic ectasia, unspecified site: Secondary | ICD-10-CM | POA: Diagnosis not present

## 2013-08-18 DIAGNOSIS — I4891 Unspecified atrial fibrillation: Secondary | ICD-10-CM | POA: Diagnosis not present

## 2013-08-18 DIAGNOSIS — I2510 Atherosclerotic heart disease of native coronary artery without angina pectoris: Secondary | ICD-10-CM | POA: Diagnosis not present

## 2013-08-18 DIAGNOSIS — I4892 Unspecified atrial flutter: Secondary | ICD-10-CM | POA: Diagnosis not present

## 2013-08-22 ENCOUNTER — Other Ambulatory Visit: Payer: Self-pay

## 2013-08-22 ENCOUNTER — Emergency Department (HOSPITAL_COMMUNITY): Payer: Medicare Other

## 2013-08-22 ENCOUNTER — Encounter (HOSPITAL_COMMUNITY): Payer: Self-pay | Admitting: Emergency Medicine

## 2013-08-22 ENCOUNTER — Inpatient Hospital Stay (HOSPITAL_COMMUNITY)
Admission: EM | Admit: 2013-08-22 | Discharge: 2013-08-29 | DRG: 219 | Disposition: A | Discharge status: Home / Self Care | Payer: Medicare Other | Attending: Internal Medicine | Admitting: Internal Medicine

## 2013-08-22 ENCOUNTER — Telehealth: Payer: Self-pay | Admitting: Interventional Cardiology

## 2013-08-22 DIAGNOSIS — I712 Thoracic aortic aneurysm, without rupture, unspecified: Secondary | ICD-10-CM | POA: Diagnosis not present

## 2013-08-22 DIAGNOSIS — D649 Anemia, unspecified: Secondary | ICD-10-CM | POA: Diagnosis not present

## 2013-08-22 DIAGNOSIS — Z885 Allergy status to narcotic agent status: Secondary | ICD-10-CM

## 2013-08-22 DIAGNOSIS — I4901 Ventricular fibrillation: Secondary | ICD-10-CM | POA: Diagnosis not present

## 2013-08-22 DIAGNOSIS — R339 Retention of urine, unspecified: Secondary | ICD-10-CM | POA: Diagnosis not present

## 2013-08-22 DIAGNOSIS — I5022 Chronic systolic (congestive) heart failure: Secondary | ICD-10-CM | POA: Diagnosis present

## 2013-08-22 DIAGNOSIS — I4892 Unspecified atrial flutter: Secondary | ICD-10-CM | POA: Diagnosis present

## 2013-08-22 DIAGNOSIS — I6509 Occlusion and stenosis of unspecified vertebral artery: Secondary | ICD-10-CM | POA: Diagnosis not present

## 2013-08-22 DIAGNOSIS — E876 Hypokalemia: Secondary | ICD-10-CM

## 2013-08-22 DIAGNOSIS — Z79899 Other long term (current) drug therapy: Secondary | ICD-10-CM | POA: Diagnosis not present

## 2013-08-22 DIAGNOSIS — E785 Hyperlipidemia, unspecified: Secondary | ICD-10-CM | POA: Diagnosis present

## 2013-08-22 DIAGNOSIS — I5042 Chronic combined systolic (congestive) and diastolic (congestive) heart failure: Secondary | ICD-10-CM | POA: Diagnosis present

## 2013-08-22 DIAGNOSIS — I369 Nonrheumatic tricuspid valve disorder, unspecified: Secondary | ICD-10-CM | POA: Diagnosis not present

## 2013-08-22 DIAGNOSIS — Z7982 Long term (current) use of aspirin: Secondary | ICD-10-CM

## 2013-08-22 DIAGNOSIS — Z86718 Personal history of other venous thrombosis and embolism: Secondary | ICD-10-CM

## 2013-08-22 DIAGNOSIS — R079 Chest pain, unspecified: Secondary | ICD-10-CM | POA: Diagnosis not present

## 2013-08-22 DIAGNOSIS — I459 Conduction disorder, unspecified: Secondary | ICD-10-CM | POA: Diagnosis not present

## 2013-08-22 DIAGNOSIS — I359 Nonrheumatic aortic valve disorder, unspecified: Secondary | ICD-10-CM | POA: Diagnosis present

## 2013-08-22 DIAGNOSIS — I219 Acute myocardial infarction, unspecified: Secondary | ICD-10-CM | POA: Diagnosis present

## 2013-08-22 DIAGNOSIS — I700 Atherosclerosis of aorta: Secondary | ICD-10-CM

## 2013-08-22 DIAGNOSIS — T502X5A Adverse effect of carbonic-anhydrase inhibitors, benzothiadiazides and other diuretics, initial encounter: Secondary | ICD-10-CM | POA: Diagnosis not present

## 2013-08-22 DIAGNOSIS — I472 Ventricular tachycardia: Secondary | ICD-10-CM | POA: Diagnosis not present

## 2013-08-22 DIAGNOSIS — I771 Stricture of artery: Secondary | ICD-10-CM | POA: Diagnosis present

## 2013-08-22 DIAGNOSIS — Z9861 Coronary angioplasty status: Secondary | ICD-10-CM

## 2013-08-22 DIAGNOSIS — I5023 Acute on chronic systolic (congestive) heart failure: Secondary | ICD-10-CM | POA: Diagnosis present

## 2013-08-22 DIAGNOSIS — D62 Acute posthemorrhagic anemia: Secondary | ICD-10-CM | POA: Diagnosis not present

## 2013-08-22 DIAGNOSIS — I7100 Dissection of unspecified site of aorta: Secondary | ICD-10-CM

## 2013-08-22 DIAGNOSIS — I4729 Other ventricular tachycardia: Secondary | ICD-10-CM | POA: Diagnosis not present

## 2013-08-22 DIAGNOSIS — Z87891 Personal history of nicotine dependence: Secondary | ICD-10-CM | POA: Diagnosis not present

## 2013-08-22 DIAGNOSIS — I498 Other specified cardiac arrhythmias: Secondary | ICD-10-CM | POA: Diagnosis not present

## 2013-08-22 DIAGNOSIS — I509 Heart failure, unspecified: Secondary | ICD-10-CM

## 2013-08-22 DIAGNOSIS — I5033 Acute on chronic diastolic (congestive) heart failure: Secondary | ICD-10-CM | POA: Diagnosis present

## 2013-08-22 DIAGNOSIS — Z952 Presence of prosthetic heart valve: Secondary | ICD-10-CM

## 2013-08-22 DIAGNOSIS — Z888 Allergy status to other drugs, medicaments and biological substances status: Secondary | ICD-10-CM

## 2013-08-22 DIAGNOSIS — I2510 Atherosclerotic heart disease of native coronary artery without angina pectoris: Secondary | ICD-10-CM

## 2013-08-22 DIAGNOSIS — Z7901 Long term (current) use of anticoagulants: Secondary | ICD-10-CM

## 2013-08-22 DIAGNOSIS — K8020 Calculus of gallbladder without cholecystitis without obstruction: Secondary | ICD-10-CM | POA: Diagnosis not present

## 2013-08-22 DIAGNOSIS — J90 Pleural effusion, not elsewhere classified: Secondary | ICD-10-CM | POA: Diagnosis not present

## 2013-08-22 DIAGNOSIS — I10 Essential (primary) hypertension: Secondary | ICD-10-CM

## 2013-08-22 DIAGNOSIS — Z8679 Personal history of other diseases of the circulatory system: Secondary | ICD-10-CM | POA: Diagnosis not present

## 2013-08-22 DIAGNOSIS — J9819 Other pulmonary collapse: Secondary | ICD-10-CM | POA: Diagnosis not present

## 2013-08-22 DIAGNOSIS — J811 Chronic pulmonary edema: Secondary | ICD-10-CM | POA: Diagnosis not present

## 2013-08-22 DIAGNOSIS — R0602 Shortness of breath: Secondary | ICD-10-CM | POA: Diagnosis not present

## 2013-08-22 DIAGNOSIS — I4891 Unspecified atrial fibrillation: Secondary | ICD-10-CM | POA: Diagnosis present

## 2013-08-22 DIAGNOSIS — R001 Bradycardia, unspecified: Secondary | ICD-10-CM

## 2013-08-22 HISTORY — DX: Acute on chronic diastolic (congestive) heart failure: I50.33

## 2013-08-22 LAB — COMPREHENSIVE METABOLIC PANEL
ALBUMIN: 4.2 g/dL (ref 3.5–5.2)
ALT: 62 U/L — ABNORMAL HIGH (ref 0–53)
AST: 57 U/L — ABNORMAL HIGH (ref 0–37)
Alkaline Phosphatase: 118 U/L — ABNORMAL HIGH (ref 39–117)
BUN: 28 mg/dL — AB (ref 6–23)
CALCIUM: 9.8 mg/dL (ref 8.4–10.5)
CO2: 26 mEq/L (ref 19–32)
CREATININE: 0.99 mg/dL (ref 0.50–1.35)
Chloride: 100 mEq/L (ref 96–112)
GFR calc non Af Amer: 79 mL/min — ABNORMAL LOW (ref >90)
GLUCOSE: 129 mg/dL — AB (ref 70–99)
Potassium: 3.9 mEq/L (ref 3.7–5.3)
Sodium: 142 mEq/L (ref 137–147)
TOTAL PROTEIN: 8.3 g/dL (ref 6.0–8.3)
Total Bilirubin: 0.9 mg/dL (ref 0.3–1.2)

## 2013-08-22 LAB — CBC
HEMATOCRIT: 34.6 % — AB (ref 39.0–52.0)
Hemoglobin: 10.7 g/dL — ABNORMAL LOW (ref 13.0–17.0)
MCH: 28.9 pg (ref 26.0–34.0)
MCHC: 30.9 g/dL (ref 30.0–36.0)
MCV: 93.5 fL (ref 78.0–100.0)
Platelets: 244 10*3/uL (ref 150–400)
RBC: 3.7 MIL/uL — ABNORMAL LOW (ref 4.22–5.81)
RDW: 17.1 % — AB (ref 11.5–15.5)
WBC: 9.8 10*3/uL (ref 4.0–10.5)

## 2013-08-22 LAB — BASIC METABOLIC PANEL
BUN: 28 mg/dL — ABNORMAL HIGH (ref 6–23)
CALCIUM: 9.4 mg/dL (ref 8.4–10.5)
CO2: 25 meq/L (ref 19–32)
CREATININE: 0.99 mg/dL (ref 0.50–1.35)
Chloride: 101 mEq/L (ref 96–112)
GFR calc Af Amer: >90 mL/min (ref >90)
GFR calc non Af Amer: 79 mL/min — ABNORMAL LOW (ref >90)
Glucose, Bld: 146 mg/dL — ABNORMAL HIGH (ref 70–99)
Potassium: 4.7 mEq/L (ref 3.7–5.3)
Sodium: 141 mEq/L (ref 137–147)

## 2013-08-22 LAB — SAMPLE TO BLOOD BANK

## 2013-08-22 LAB — MRSA PCR SCREENING: MRSA BY PCR: NEGATIVE

## 2013-08-22 LAB — I-STAT TROPONIN, ED
TROPONIN I, POC: 0.02 ng/mL (ref 0.00–0.08)
Troponin i, poc: 0.02 ng/mL (ref 0.00–0.08)

## 2013-08-22 LAB — URINALYSIS, ROUTINE W REFLEX MICROSCOPIC
BILIRUBIN URINE: NEGATIVE
Glucose, UA: NEGATIVE mg/dL
HGB URINE DIPSTICK: NEGATIVE
Ketones, ur: NEGATIVE mg/dL
Leukocytes, UA: NEGATIVE
Nitrite: NEGATIVE
PROTEIN: NEGATIVE mg/dL
Specific Gravity, Urine: 1.023 (ref 1.005–1.030)
UROBILINOGEN UA: 0.2 mg/dL (ref 0.0–1.0)
pH: 7.5 (ref 5.0–8.0)

## 2013-08-22 LAB — PRO B NATRIURETIC PEPTIDE: PRO B NATRI PEPTIDE: 4028 pg/mL — AB (ref 0–125)

## 2013-08-22 LAB — PROTIME-INR
INR: 1.59 — ABNORMAL HIGH (ref 0.00–1.49)
PROTHROMBIN TIME: 18.5 s — AB (ref 11.6–15.2)

## 2013-08-22 LAB — APTT: aPTT: 36 seconds (ref 24–37)

## 2013-08-22 LAB — TROPONIN I: Troponin I: <0.3 ng/mL (ref <0.30)

## 2013-08-22 MED ORDER — ADULT MULTIVITAMIN W/MINERALS CH
1.0000 | ORAL_TABLET | Freq: Every day | ORAL | Status: DC
  Administered 2013-08-22 – 2013-08-29 (×7): 1 via ORAL
  Filled 2013-08-22 (×8): qty 1

## 2013-08-22 MED ORDER — FENTANYL CITRATE 0.05 MG/ML IJ SOLN
50.0000 ug | Freq: Once | INTRAMUSCULAR | Status: AC
  Administered 2013-08-22: 50 ug via INTRAVENOUS
  Filled 2013-08-22: qty 2

## 2013-08-22 MED ORDER — FUROSEMIDE 10 MG/ML IJ SOLN
40.0000 mg | Freq: Two times a day (BID) | INTRAMUSCULAR | Status: DC
  Administered 2013-08-23 – 2013-08-29 (×12): 40 mg via INTRAVENOUS
  Filled 2013-08-22 (×17): qty 4

## 2013-08-22 MED ORDER — NITROGLYCERIN 0.4 MG SL SUBL: 0.4000 mg | SUBLINGUAL_TABLET | SUBLINGUAL | Status: DC | PRN

## 2013-08-22 MED ORDER — AMIODARONE HCL 200 MG PO TABS
400.0000 mg | ORAL_TABLET | Freq: Every day | ORAL | Status: DC
  Administered 2013-08-22: 400 mg via ORAL
  Filled 2013-08-22 (×2): qty 2

## 2013-08-22 MED ORDER — IOHEXOL 350 MG/ML SOLN
75.0000 mL | Freq: Once | INTRAVENOUS | Status: AC | PRN
  Administered 2013-08-22: 75 mL via INTRAVENOUS

## 2013-08-22 MED ORDER — DEXTROSE 5 % IV SOLN
0.5000 mg/min | INTRAVENOUS | Status: DC
  Administered 2013-08-22: 0.5 mg/min via INTRAVENOUS
  Filled 2013-08-22 (×2): qty 100

## 2013-08-22 MED ORDER — IOHEXOL 350 MG/ML SOLN
50.0000 mL | Freq: Once | INTRAVENOUS | Status: AC | PRN
  Administered 2013-08-22: 50 mL via INTRAVENOUS

## 2013-08-22 MED ORDER — PANTOPRAZOLE SODIUM 40 MG PO TBEC
40.0000 mg | DELAYED_RELEASE_TABLET | Freq: Every day | ORAL | Status: DC
Start: 2013-08-22 — End: 2013-08-27
  Administered 2013-08-22 – 2013-08-25 (×4): 40 mg via ORAL
  Filled 2013-08-22 (×5): qty 1

## 2013-08-22 MED ORDER — SODIUM CHLORIDE 0.9 % IV SOLN: 250.0000 mL | INTRAVENOUS | Status: DC | PRN

## 2013-08-22 MED ORDER — ENALAPRIL MALEATE 5 MG PO TABS
5.0000 mg | ORAL_TABLET | Freq: Every day | ORAL | Status: DC
  Administered 2013-08-22 – 2013-08-23 (×2): 5 mg via ORAL
  Filled 2013-08-22 (×3): qty 1

## 2013-08-22 MED ORDER — LABETALOL HCL 5 MG/ML IV SOLN
10.0000 mg | Freq: Once | INTRAVENOUS | Status: AC
  Administered 2013-08-22: 10 mg via INTRAVENOUS
  Filled 2013-08-22: qty 4

## 2013-08-22 MED ORDER — DILTIAZEM HCL ER COATED BEADS 240 MG PO CP24
240.0000 mg | ORAL_CAPSULE | Freq: Two times a day (BID) | ORAL | Status: DC
  Administered 2013-08-22: 240 mg via ORAL
  Filled 2013-08-22 (×3): qty 1

## 2013-08-22 MED ORDER — FUROSEMIDE 10 MG/ML IJ SOLN
80.0000 mg | Freq: Once | INTRAMUSCULAR | Status: AC
  Administered 2013-08-22: 80 mg via INTRAVENOUS
  Filled 2013-08-22: qty 8

## 2013-08-22 MED ORDER — LORATADINE 10 MG PO TABS
10.0000 mg | ORAL_TABLET | Freq: Every day | ORAL | Status: DC
  Administered 2013-08-22 – 2013-08-29 (×7): 10 mg via ORAL
  Filled 2013-08-22 (×8): qty 1

## 2013-08-22 MED ORDER — ATORVASTATIN CALCIUM 10 MG PO TABS
10.0000 mg | ORAL_TABLET | Freq: Every day | ORAL | Status: DC
  Administered 2013-08-22 – 2013-08-29 (×7): 10 mg via ORAL
  Filled 2013-08-22 (×8): qty 1

## 2013-08-22 MED ORDER — POTASSIUM CHLORIDE CRYS ER 20 MEQ PO TBCR
40.0000 meq | EXTENDED_RELEASE_TABLET | Freq: Every day | ORAL | Status: DC
  Administered 2013-08-22 – 2013-08-23 (×2): 40 meq via ORAL
  Filled 2013-08-22 (×2): qty 2

## 2013-08-22 MED ORDER — SODIUM CHLORIDE 0.9 % IJ SOLN
3.0000 mL | INTRAMUSCULAR | Status: DC | PRN
Start: 2013-08-22 — End: 2013-08-29

## 2013-08-22 MED ORDER — SODIUM CHLORIDE 0.9 % IJ SOLN
3.0000 mL | Freq: Two times a day (BID) | INTRAMUSCULAR | Status: DC
  Administered 2013-08-22 – 2013-08-28 (×8): 3 mL via INTRAVENOUS

## 2013-08-22 MED ORDER — ASPIRIN EC 81 MG PO TBEC
81.0000 mg | DELAYED_RELEASE_TABLET | Freq: Every day | ORAL | Status: DC
  Administered 2013-08-22 – 2013-08-29 (×7): 81 mg via ORAL
  Filled 2013-08-22 (×8): qty 1

## 2013-08-22 MED ORDER — HEPARIN SODIUM (PORCINE) 5000 UNIT/ML IJ SOLN
5000.0000 [IU] | Freq: Three times a day (TID) | INTRAMUSCULAR | Status: DC
  Administered 2013-08-22 – 2013-08-25 (×9): 5000 [IU] via SUBCUTANEOUS
  Administered 2013-08-26: 7000 [IU] via SUBCUTANEOUS
  Administered 2013-08-26: 4000 [IU] via SUBCUTANEOUS
  Administered 2013-08-26 – 2013-08-27 (×2): 5000 [IU] via SUBCUTANEOUS
  Filled 2013-08-22 (×18): qty 1

## 2013-08-22 MED ORDER — METOPROLOL TARTRATE 100 MG PO TABS
100.0000 mg | ORAL_TABLET | Freq: Two times a day (BID) | ORAL | Status: DC
  Administered 2013-08-22: 100 mg via ORAL
  Filled 2013-08-22 (×3): qty 1

## 2013-08-22 NOTE — ED Provider Notes (Signed)
CSN: [id number]     Arrival date & time 08/22/13  7829 History   First MD Initiated Contact with Patient 08/22/13 1034     Chief Complaint  Patient presents with  . Shortness of Breath     (Consider location/radiation/quality/duration/timing/severity/associated sxs/prior Treatment) HPI Patient reports this morning he started having central chest pain that radiated into his back. His wife reports he's been short of breath over the past week that got worse the last couple days. He has been having swelling of his legs and his abdomen has been getting tight. She states this morning his face he looks well in. She has been doubling the dose of his Lasix without result. She reports he has gained 10 pounds. Patient states the pain is a pressure and he rates it as a 5-6/10. 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 Patient has never had congestive heart failure before. Wife states he got a steroid injection for possible URI/sinus infection and after that he started having the swelling in shortness of breath. That was 10 days ago.  PCP  Dr Elton Sin in [facility] Cardiologist Dr Eldridge Dace Thoracic Surgeon Dr Tyrone Sage  Past Medical History  Diagnosis Date  . Hyperlipidemia   . DVT (deep venous thrombosis)     post knee surgery  . Tobacco abuse   . HTN (hypertension)   . Descending aortic aneurysm   . Aortic valve insufficiency   . Thoracic aneurysm   . S/P AVR (aortic valve replacement)   . CAD (coronary artery disease)   . Atrial flutter    Past Surgical History  Procedure Laterality Date  . Right groin lymphocele  [id number]  . Aortic valve replacement  [id number]  . Replacement total knee      bilateral   History reviewed. No pertinent family history. History  Substance Use Topics  . Smoking status: Former Smoker    Types: Cigarettes    Quit date: 04/16/1974  . Smokeless tobacco: Never Used  . Alcohol Use: No  lives at  home Lives with spouse  Review of Systems  All other systems reviewed and are negative.      Allergies  Lortab and Morphine and related  Home Medications   Current Outpatient Rx  Name  Route  Sig  Dispense  Refill  . amiodarone (PACERONE) 400 MG tablet   Oral   Take 1 tablet (400 mg total) by mouth daily.   60 tablet   3   . apixaban (ELIQUIS) 5 MG TABS tablet   Oral   Take 5 mg by mouth 2 (two) times daily.         Marland Kitchen aspirin EC 81 MG tablet   Oral   Take 81 mg by mouth daily.           Marland Kitchen atorvastatin (LIPITOR) 10 MG tablet   Oral   Take 1 tablet (10 mg total) by mouth daily.   90 tablet   3   . diltiazem (CARDIZEM CD) 240 MG 24 hr capsule   Oral   Take 1 capsule (240 mg total) by mouth 2 (two) times daily.   60 capsule   11   . enalapril (VASOTEC) 5 MG tablet   Oral   Take 5 mg by mouth daily.           . furosemide (LASIX) 40 MG tablet   Oral   Take 1 tablet (40 mg total) by mouth 2 (two) times  daily.   60 tablet   11   . lansoprazole (PREVACID) 30 MG capsule   Oral   Take 30 mg by mouth daily.          Marland Kitchen. loratadine (CLARITIN) 10 MG tablet   Oral   Take 10 mg by mouth daily.           . metoprolol (LOPRESSOR) 100 MG tablet   Oral   Take 1 tablet (100 mg total) by mouth 2 (two) times daily.   60 tablet   7   . Multiple Vitamin (MULTIVITAMIN WITH MINERALS) TABS tablet   Oral   Take 1 tablet by mouth daily.         . nitroGLYCERIN (NITROSTAT) 0.4 MG SL tablet   Sublingual   Place 0.4 mg under the tongue every 5 (five) minutes as needed.           . potassium chloride SA (K-DUR,KLOR-CON) 20 MEQ tablet   Oral   Take 2 tablets (40 mEq total) by mouth daily.   60 tablet   11    BP 175/66  Pulse 62  Temp(Src) 97.7 F (36.5 C)  Resp 18  Ht 5\' 10"  (1.778 m)  Wt 191 lb 3.2 oz (86.728 kg)  BMI 27.43 kg/m2  SpO2 98%  Vital signs normal   Physical Exam  Nursing note and vitals reviewed. Constitutional: He is oriented to  person, place, and time. He appears well-developed and well-nourished.  Non-toxic appearance. He does not appear ill. No distress.  HENT:  Head: Normocephalic and atraumatic.  Right Ear: External ear normal.  Left Ear: External ear normal.  Nose: Nose normal. No mucosal edema or rhinorrhea.  Mouth/Throat: Oropharynx is clear and moist and mucous membranes are normal. No dental abscesses or uvula swelling.  Eyes: Conjunctivae and EOM are normal. Pupils are equal, round, and reactive to light.  Neck: Normal range of motion and full passive range of motion without pain. Neck supple.  Cardiovascular: Normal rate, regular rhythm and normal heart sounds.  Exam reveals no gallop and no friction rub.   No murmur heard. Pulmonary/Chest: Accessory muscle usage present. Tachypnea noted. He is in respiratory distress. He has decreased breath sounds. He has no wheezes. He has no rhonchi. He has no rales. He exhibits no tenderness and no crepitus.  Patient appears short of breath  Abdominal: Soft. Normal appearance and bowel sounds are normal. He exhibits no distension. There is no tenderness. There is no rebound and no guarding.  Musculoskeletal: Normal range of motion. He exhibits edema. He exhibits no tenderness.  Patient has pitting edema up to his knees bilaterally  Neurological: He is alert and oriented to person, place, and time. He has normal strength. No cranial nerve deficit.  Skin: Skin is warm, dry and intact. No rash noted. No erythema. No pallor.  Psychiatric: He has a normal mood and affect. His speech is normal and behavior is normal. His mood appears not anxious.    ED Course  Procedures (including critical care time)  Medications  labetalol (NORMODYNE,TRANDATE) 4 mg/mL in dextrose 5 % 125 mL infusion (1.5 mg/min Intravenous Rate/Dose Change 08/22/13 1342)  labetalol (NORMODYNE,TRANDATE) injection 10 mg (10 mg Intravenous Given 08/22/13 1104)  fentaNYL (SUBLIMAZE) injection 50 mcg (50 mcg  Intravenous Given 08/22/13 1103)  iohexol (OMNIPAQUE) 350 MG/ML injection 75 mL (75 mLs Intravenous Contrast Given 08/22/13 1124)  iohexol (OMNIPAQUE) 350 MG/ML injection 50 mL (50 mLs Intravenous Contrast Given 08/22/13 1126)  furosemide (LASIX)  injection 80 mg (80 mg Intravenous Given 08/22/13 1245)   Patient was brought back to a room from x-ray. I saw the patient when he was on a stretcher but waiting to go into his room. Expected Dr. Halina Maidens office at 10:56 AM. She is trying to reach him however he is in surgery. He called me back at 1111 and wants me to add a CT angiogram of the neck and brain. 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  1206 radiology skull CT imaging of chest/abdomen/pelvis result. Patient has congestive heart failure. He has stable aortic root repair with dilatation of the aorta distal to the isthmus up to 6 cm. There is a freely communicating dissection because the flap is not intact. He sees mild ascites but no evidence of acute intra-abdominal hemorrhage. He states he sees no acute changes in his abdominal aortic aneurysm.  1225 patient and wife given results of his scan. I am still waiting for Dr. Nydia Bouton to call back so he can also review the scan. We discussed admission for control of his congestive heart failure. Wife states she's never had the congestive heart failure before.  12:37 Dr Tyrone Sage has looked at his scans, states he is still considering repair of the distal dissection, but it is not emergent to be done today. Will see patient and wife later to discuss further plans.    Patient has been getting good urinary output from the Lasix.  14:10 Trish, [place] cardmaster will have someone come admit patient.      Labs Review Results for orders placed during the hospital encounter of 08/22/13  CBC      Result Value Ref Range   WBC 9.8  4.0 - 10.5 K/uL   RBC 3.70 (*) 4.22 - 5.81 MIL/uL   Hemoglobin 10.7 (*) 13.0 - 17.0 g/dL   HCT 16.1 (*) 09.6 -  52.0 %   MCV 93.5  78.0 - 100.0 fL   MCH 28.9  26.0 - 34.0 pg   MCHC 30.9  30.0 - 36.0 g/dL   RDW 04.5 (*) 40.9 - 81.1 %   Platelets 244  150 - 400 K/uL  BASIC METABOLIC PANEL      Result Value Ref Range   Sodium 141  137 - 147 mEq/L   Potassium 4.7  3.7 - 5.3 mEq/L   Chloride 101  96 - 112 mEq/L   CO2 25  19 - 32 mEq/L   Glucose, Bld 146 (*) 70 - 99 mg/dL   BUN 28 (*) 6 - 23 mg/dL   Creatinine, Ser 9.14  0.50 - 1.35 mg/dL   Calcium 9.4  8.4 - 78.2 mg/dL   GFR calc non Af Amer 79 (*) >90 mL/min   GFR calc Af Amer >90  >90 mL/min  PRO B NATRIURETIC PEPTIDE      Result Value Ref Range   Pro B Natriuretic peptide (BNP) 4028.0 (*) 0 - 125 pg/mL  APTT      Result Value Ref Range   aPTT 36  24 - 37 seconds  PROTIME-INR      Result Value Ref Range   Prothrombin Time 18.5 (*) 11.6 - 15.2 seconds   INR 1.59 (*) 0.00 - 1.49  TROPONIN I      Result Value Ref Range   Troponin I <0.30  <0.30 ng/mL  URINALYSIS, ROUTINE W REFLEX MICROSCOPIC      Result Value Ref Range   Color, Urine YELLOW  YELLOW   APPearance  CLEAR  CLEAR   Specific Gravity, Urine 1.023  1.005 - 1.030   pH 7.5  5.0 - 8.0   Glucose, UA NEGATIVE  NEGATIVE mg/dL   Hgb urine dipstick NEGATIVE  NEGATIVE   Bilirubin Urine NEGATIVE  NEGATIVE   Ketones, ur NEGATIVE  NEGATIVE mg/dL   Protein, ur NEGATIVE  NEGATIVE mg/dL   Urobilinogen, UA 0.2  0.0 - 1.0 mg/dL   Nitrite NEGATIVE  NEGATIVE   Leukocytes, UA NEGATIVE  NEGATIVE  COMPREHENSIVE METABOLIC PANEL      Result Value Ref Range   Sodium 142  137 - 147 mEq/L   Potassium 3.9  3.7 - 5.3 mEq/L   Chloride 100  96 - 112 mEq/L   CO2 26  19 - 32 mEq/L   Glucose, Bld 129 (*) 70 - 99 mg/dL   BUN 28 (*) 6 - 23 mg/dL   Creatinine, Ser 1.61  0.50 - 1.35 mg/dL   Calcium 9.8  8.4 - 09.6 mg/dL   Total Protein 8.3  6.0 - 8.3 g/dL   Albumin 4.2  3.5 - 5.2 g/dL   AST 57 (*) 0 - 37 U/L   ALT 62 (*) 0 - 53 U/L   Alkaline Phosphatase 118 (*) 39 - 117 U/L   Total Bilirubin 0.9  0.3  - 1.2 mg/dL   GFR calc non Af Amer 79 (*) >90 mL/min   GFR calc Af Amer >90  >90 mL/min  I-STAT TROPOININ, ED      Result Value Ref Range   Troponin i, poc 0.02  0.00 - 0.08 ng/mL   Comment 3           I-STAT TROPOININ, ED      Result Value Ref Range   Troponin i, poc 0.02  0.00 - 0.08 ng/mL   Comment 3           SAMPLE TO BLOOD BANK      Result Value Ref Range   Blood Bank Specimen SAMPLE AVAILABLE FOR TESTING     Sample Expiration 08/23/2013       Laboratory interpretation all normal except anemia, elevation of LFTs, increased BNP   Imaging Review Dg Chest 2 View  08/22/2013   CLINICAL DATA:  Shortness of breath.  History of aortic dissection.  EXAM: CHEST  2 VIEW  COMPARISON:  CT chest 05/04/2013. Single view of the chest 05/12/2013 and 11 16/2014. PA and lateral chest 05/09/2013.  FINDINGS: There is cardiomegaly and pulmonary edema with small bilateral pleural effusions. The patient is status post aortic valve replacement. Marked dilatation of the aortic arch appears increased since the prior study.  IMPRESSION: Marked increase in size of the aortic arch since the prior exam worrisome for worsened dissection/aneurysm and possibly hemorrhage. The patient in congestive failure with marked cardiomegaly. CT angiogram of the aorta is recommended.  Critical Value/emergent results were called by telephone at the time of interpretation on 08/22/2013 at 10:26 AM to Dr. Karma Ganja, who verbally acknowledged these results.   Electronically Signed   By: Drusilla Kanner M.D.   On: 08/22/2013 10:28    Ct Angio Head W/cm &/or Wo Cm  Ct Angio Neck W/cm &/or Wo/cm  08/22/2013   CLINICAL DATA:  73 year old male with history of thoracic aneurysm status post repair. Progressive weight gain, swelling and shortness of Breath. Recently thought to have sinus infection, treated with antibiotics and steroids. Symptoms increasing. Initial encounter.  EXAM: CT ANGIOGRAPHY HEAD AND NECK  TECHNIQUE: Multidetector CT  imaging  of the head and neck was performed using the standard protocol during bolus administration of intravenous contrast. Multiplanar CT image reconstructions and MIPs were obtained to evaluate the vascular anatomy. Carotid stenosis measurements (when applicable) are obtained utilizing NASCET criteria, using the distal internal carotid diameter as the denominator.  CONTRAST:  50mL OMNIPAQUE IOHEXOL 350 MG/ML SOLN  COMPARISON:  Chest abdomen and pelvis CTA from the same day reported separately.  FINDINGS: CTA HEAD FINDINGS  Calvarium intact. Negative scalp soft tissues. Cerebral volume is within normal limits for age. No midline shift, ventriculomegaly, mass effect, evidence of mass lesion, intracranial hemorrhage or evidence of cortically based acute infarction. Gray-white matter differentiation is within normal limits throughout the brain. No abnormal enhancement identified.  VASCULAR FINDINGS:  Major intracranial venous structures are enhancing.  Dominant distal left vertebral artery is patent without stenosis despite some calcified plaque. Retrograde supply to the non dominant distal right vertebral artery. Right PICA and AICA are patent. Dominant left AICA. The basilar artery is patent without stenosis. SCA and PCA origins are within normal limits. Normal left posterior communicating artery, the right is diminutive or absent. Bilateral PCA branches are within normal limits.  Some cavernous sinus venous contrast artifact is present. Both ICA siphons are patent. Ophthalmic and posterior communicating arteries are within normal limits. Normal carotid termini. Normal MCA and ACA origins. Anterior communicating artery and bilateral ACA branches are within normal limits. Bilateral MCA branches are within normal limits.  Review of the MIP images confirms the above findings.  CTA NECK FINDINGS  Chest findings are reported separately (please see that report). Negative thyroid, larynx, pharynx, parapharyngeal spaces,  retropharyngeal space. Extensive dental hardware artifact. Negative visualized sublingual space, submandibular glands and submandibular space. Negative parotid glands. No acute orbits soft tissue findings.  No cervical lymphadenopathy. Mucous retention cyst left maxillary sinus. Other Visualized paranasal sinuses and mastoids are clear. No acute osseous abnormality identified. Degenerative changes in the cervical spine.  VASCULAR FINDINGS:  SEE CHEST CTA FINDINGS FROM TODAY REPORTED SEPARATELY.  Both internal jugular veins are patent. The left subclavian and innominate vein are patent. Negative non contrast appearance of the right subclavian. The right innominate vein is patent.  Great vessel origins are patent, dolichoectatic. Widespread mild soft and calcified circumferential plaque in the great vessels.  Patent, mildly dolichoectatic right CCA. Patent right carotid bifurcation with no right ICA stenosis despite calcified plaque. Tortuous, mildly dolichoectatic cervical right ICA.  No proximal right subclavian artery stenosis, with some dolichoectasia. Non dominant right vertebral artery is occluded at its origin. Distal right vertebral artery flow is reconstituted briefly at the C2-C3 level, but then re- occludes below the skullbase. Intracranial findings detailed above.  Patent, the dolichoectatic left CCA. Calcified plaque at the left carotid bifurcation which is significantly affected by dental streak artifact. Loss of enhancement in the left ICA origin and bulb felt to be artifactual (see series 12, image 172). Tortuous/dolichoectatic but otherwise negative cervical left ICA be on that level.  No proximal left subclavian artery stenosis. Dominant left vertebral artery origin widely patent. Tortuous proximal left vertebral artery. Dominant left vertebral artery tortuous and patent to the skullbase.  Review of the MIP images confirms the above findings.  IMPRESSION: 1. Abnormal thoracic aorta, see chest CTA  from today reported separately. 2. Patent, dolichoectatic great vessels without arterial dissection in the neck. 3. Occluded non dominant right vertebral artery. Patent, dominant left vertebral artery. 4. Suspect artifactual decreased enhancement of the left ICA origin, related to severe  dental hardware artifact. Absence of any proximal left ICA stenosis could be confirmed with either carotid Doppler or neck MRA if necessary. 5. Distal right vertebral artery supplied in a retrograde fashion from the left. Intracranial atherosclerosis, but no intracranial stenosis and otherwise negative intracranial CTA. 6.  No acute intracranial abnormality.   Electronically Signed   By: Augusto Gamble M.D.   On: 08/22/2013 12:26   Ct Angio Chest W/cm &/or Wo Cm  Ct Angio Abd/pel W/ And/or W/o  08/22/2013   CLINICAL DATA:  Short of breath. Weight gain. Swelling. Abnormal chest radiograph. Known aortic dissection with change in the appearance of the mediastinum compared to prior.  EXAM: CT ANGIOGRAPHY CHEST, ABDOMEN AND PELVIS  TECHNIQUE: Multidetector CT imaging through the chest, abdomen and pelvis was performed using the standard protocol during bolus administration of intravenous contrast. Multiplanar reconstructed images and MIPs were obtained and reviewed to evaluate the vascular anatomy.  CONTRAST:  48mL OMNIPAQUE IOHEXOL 350 MG/ML SOLN  COMPARISON:  [place] THORA/PARACENTESIS dated 04/13/2009; [place] THORA/PARACENTESIS dated 03/29/2009; DG ABD PORTABLE 1V dated 02/18/2009; DG CHEST 2 VIEW dated 08/22/2013; CT ANGIO CHEST AORTA W/CM & WO/CM dated 05/04/2013  FINDINGS: CTA CHEST FINDINGS  The patient has undergone aortic root replacement with aortic valve replacement. This appears satisfactory. In the interval since the prior exam, the aortic arch has continued to dilate distal to the isthmus, and the dissection now off shows a large area of communication. The aortic lumen measures 61 mm maximally. The dissection flap remains present within  the lumen, thicker than on prior, with the goal opacification of the entire aorta.  There is no hemothorax. No hemopericardium. Tortuous ectatic aortic branch vessels. There is no axillary adenopathy. Small mediastinal lymph nodes are present which are probably congestive. Heart size as enlarged compared to the prior exam with new cardiomegaly since the prior CT. Small bilateral right greater than left pleural effusions are present with associated atelectasis. Scattered foci of airspace opacity are present in the lungs, with upper lobe predominance. Although these could represent areas of infection, given the increase in the heart size, alveolar pulmonary edema is favored. Interlobular septal thickening is present at the apices, also consistent with CHF. Severe coronary artery atherosclerosis is incidentally noted. Thoracic spondylosis. Median sternotomy. No aggressive osseous lesions.  8 mm right upper lobe pulmonary nodule is benign, stable since 2012.  Review of the MIP images confirms the above findings.  CTA ABDOMEN AND PELVIS FINDINGS  The thoracic aortic aneurysm tapers to more normal caliber in the abdomen. Just inferior to the aortic hiatus, the abdominal aorta measures 29 mm. Ectasia of the infrarenal abdominal aorta measuring 26 mm. Iliofemoral atherosclerosis is present.  Cholelithiasis. Small amount of ascites. Right renal cysts. Prostatomegaly with 54 cm transverse prostate span. Fat containing periumbilical hernia. Small bilateral inguinal hernia is. Left inguinal hernia contains a portion of the sigmoid colon. Severe colonic diverticulosis. Right inguinal hernia sac contains ascites. No aggressive osseous lesions. Lumbar spondylosis and facet arthrosis. Visceral atherosclerosis.  Review of the MIP images confirms the above findings.  IMPRESSION: 1. Cardiomegaly and mild CHF. Right-greater-than-left small bilateral pleural effusions. 2. Multifocal airspace disease in the lungs is favored to represent  alveolar edema rather than multifocal pneumonia. 3. 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 Maximal luminal diameter is 62 mm on today's study, increased from 45 mm on the prior exam. The septum from the dissection appears to have torn with the  aneurysmal dilation of the aorta. Significantly, no intramural or periaortic hematoma to suggest rupture. 4. Infrarenal abdominal aortic ectasia, cholelithiasis and bilateral inguinal hernias. 5. These results were called by telephone at the time of interpretation on 08/22/2013 at 12:23 PM to Dr. Devoria Albe , who verbally acknowledged these results. 6. Benign 8 mm right upper lobe pulmonary nodule.   Electronically Signed   By: Andreas Newport M.D.   On: 08/22/2013 12:24      EKG Interpretation   Date/Time:  Friday August 22 2013 09:38:14 EST Ventricular Rate:  64 PR Interval:  278 QRS Duration: 110 QT Interval:  498 QTC Calculation: 513 R Axis:   69 Text Interpretation:  Atrial fibrillation ST \\T \ T wave abnormality,  consider inferior ischemia Prolonged QT Since last tracing rate slower  Confirmed by Dempsy Damiano  MD-I, Reinhold Rickey ([id number]) on 08/22/2013 10:54:43 AM      MDM    patient presents with new onset congestive heart failure after getting a steroid injection for his sinus infection. He was noted to have a marked enlargement of his proximal aortic arch compared to his last chest x-ray with concern of worsening of his prior repair of his aortic dissection. Patient has no acute decompensation with his aortic dissection/repair. He is being admitted for treatment of his acute, new onset of congestive heart failure.    Final diagnoses:  CHF, acute  Chest pain  Aortic dissection     CRITICAL CARE Performed by: Devoria Albe L Total critical care time: 40 min Critical care time was exclusive of separately billable procedures and treating other patients. Critical care was necessary to treat or prevent  imminent or life-threatening deterioration. Critical care was time spent personally by me on the following activities: development of treatment plan with patient and/or surrogate as well as nursing, discussions with consultants, evaluation of patient's response to treatment, examination of patient, obtaining history from patient or surrogate, ordering and performing treatments and interventions, ordering and review of laboratory studies, ordering and review of radiographic studies, pulse oximetry and re-evaluation of patient's condition.    Ward Givens, MD 08/22/13 [phone]

## 2013-08-22 NOTE — ED Notes (Signed)
Pt has returned from being out of the department; myself and Lillia Abed, RN changed pt's linens, gown and top blankets and replaced with clean dry linens for the stretcher, gown for the pt and placed a diaper on pt and chuks underneath pt; family at bedside

## 2013-08-22 NOTE — ED Notes (Signed)
Cardiology at the bedside.

## 2013-08-22 NOTE — ED Notes (Signed)
Pt also placed back on monitor, continuous pulse oximetry and blood pressure cuff

## 2013-08-22 NOTE — ED Notes (Addendum)
Family states pt has been "extremely SOB, gained weight and is swollen all over." wife states he was treated for a sinus infection with abx and steroids at an [facility] 10 days ago and he's been congested and sob since then. Wife states pt doctor told them to increase lasix at home for these symptoms which she has been doing for past several days but the swelling and SOB continue to increase. He c/o "a little bit of chest pain right now."

## 2013-08-22 NOTE — ED Notes (Signed)
Pt taken to CT with this RN.

## 2013-08-22 NOTE — ED Notes (Signed)
Talked with EDP about pt bed status in the department. No information about admitting provider at this time. Call placed back to cardiology to determine admission provider for the patient.

## 2013-08-22 NOTE — Telephone Encounter (Signed)
Will speak with [name] asap and call pts wife back.

## 2013-08-22 NOTE — ED Notes (Signed)
EDP at the bedside.  ?

## 2013-08-22 NOTE — ED Notes (Signed)
Pt still out of the department for testing at this time 

## 2013-08-22 NOTE — ED Notes (Signed)
Pt brought back to room and EDP notified that pt has a possible aneurysm. EDP at the bedside.

## 2013-08-22 NOTE — Telephone Encounter (Signed)
Per [name] pt shoud go to the ED and pts wife agrees. She will take him now. Pt is not doing well at all.

## 2013-08-22 NOTE — Telephone Encounter (Signed)
New message     C/o swollen ankles, sob, coughing and bp is up.  Wife says he needs to be seen today

## 2013-08-22 NOTE — ED Notes (Signed)
Pt undressed, in gown, on monitor, continuous pulse oximetry and blood pressure cuff; family at bedside 

## 2013-08-22 NOTE — ED Notes (Signed)
Cardiothoracic Surgeon at the bedside.

## 2013-08-22 NOTE — H&P (Signed)
Reason for admit: Acute HF and expanding Ao dissection.  HPI:  Mr. [name] [name] is a 73 year old gentleman with h/o aortic dissection, PAF and CAD who presents with acute HF symptoms.   On 02/19/09 presented with Typa A dissection. Underwent replacement of his aortic valve with a pericardial valve, replacement of the ascending aorta and arch to the left subclavian, and bypass to the innominate artery by Dr. Tyrone Sage.    In 2/12 presented with MI. Cath with normal LM & LAD, LCK 95% mid and RCA 100% mid with L to R collaterals. Had BMS to LCX.  Echo 6/14 EF 50-55% mild to moderate MR. AVR stable  In 11/14 presented with acute CP found to have a new type B aortic dissection extending from the left subclavian down to the diaphragm with thrombosis of the false lumen and no sign of leakage or rupture. The aorta looks about the same size as last year at 4.5 cm. Treated medically with close f/u. That hospitalization c/b recalcitrant AF eventually started on amio.   About 10 days ago treated with steroid injection for sinus infection. Since that time progressive swelling and dyspnea not responding to increased po lasix. 10-15 pound weight gain. Came to ER and found to be in HF. CXR suggestive of worsening dissection. CT obtained stable aortic root repair with dilatation of the aorta distal to the isthmus up to 6 cm with chronic dissection.  Seen by Dr. Tyrone Sage who is planning surgery Monday or Tuesday once HF stabilized. Eliquis stopped. Patient denies CP.    Review of Systems:     Cardiac Review of Systems: {Y] = yes [ ]  = no  Chest Pain [    ]  Resting SOB [place].Etienne   ] Exertional SOB  [  y]  Orthopnea [  ]   Pedal Edema [   ]    Palpitations [  ] Syncope  [  ]   Presyncope [   ]  General Review of Systems: [Y] = yes [  ]=no Constitional: recent weight change [  ]; anorexia [  ]; fatigue [ y ]; nausea [  ]; night sweats [  ]; fever [  ]; or chills [  ];                                                                                                                                           Dental: poor dentition[  ];   Eye : blurred vision [  ]; diplopia [   ]; vision changes [  ];  Amaurosis fugax[  ]; Resp: cough [  ];  wheezing[  ];  hemoptysis[  ]; shortness of breath[  y]; paroxysmal nocturnal dyspnea[  ]; dyspnea on exertion[y  ]; or orthopnea[ y ];  GI:  gallstones[  ], vomiting[  ];  dysphagia[  ]; melena[  ];  hematochezia [  ]; heartburn[  ];    GU: kidney stones [  ]; hematuria[  ];   dysuria [  ];  nocturia[  ];  history of     obstruction [  ];                 Skin: rash, swelling[  ];, hair loss[  ];  peripheral edema[  ];  or itching[  ]; Musculosketetal: myalgias[  ];  joint swelling[  ];  joint erythema[  ];  joint pain[ y ];  back pain[  y];  Heme/Lymph: bruising[  ];  bleeding[  ];  anemia[  ];  Neuro: TIA[  ];  headaches[  ];  stroke[  ];  vertigo[  ];  seizures[  ];   paresthesias[  ];  difficulty walking[  ];  Psych:depression[  ]; anxiety[  ];  Endocrine: diabetes[  ];  thyroid dysfunction[  ];  Other:  Past Medical History  Diagnosis Date  . Hyperlipidemia   . DVT (deep venous thrombosis)     post knee surgery  . Tobacco abuse   . HTN (hypertension)   . Descending aortic aneurysm   . Aortic valve insufficiency   . Thoracic aneurysm   . S/P AVR (aortic valve replacement)   . CAD (coronary artery disease)   . Atrial flutter      (Not in a hospital admission)   Allergies  Allergen Reactions  . Lortab [Hydrocodone-Acetaminophen] Hives  . Morphine And Related Hives    History   Social History  . Marital Status: Married    Spouse Name: N/A    Number of Children: N/A  . Years of Education: N/A   Occupational History  . Not on file.   Social History Main Topics  . Smoking status: Former Smoker    Types: Cigarettes    Quit date: 04/16/1974  . Smokeless tobacco: Never Used  . Alcohol Use: No  . Drug Use: No  . Sexual Activity: Not on file    Other Topics Concern  . Not on file   Social History Narrative  . No narrative on file    History reviewed. No pertinent family history.  PHYSICAL EXAM: Filed Vitals:   08/22/13 1536  BP: 149/53  Pulse: 59  Temp:   Resp: 18   General:  Weak appearing. No respiratory difficulty HEENT: normal Neck: supple. JVP 9 Carotids 2+ bilat; no bruits. No lymphadenopathy or thryomegaly appreciated. Cor: PMI nondisplaced. Regular rate & rhythm. Soft SEM at RSB Lungs: clear with minimal crackles at L base Abdomen: soft, nontender, + distended. No hepatosplenomegaly. No bruits or masses. Good bowel sounds. Extremities: no cyanosis, clubbing, rash, 2+ edema Neuro: alert & oriented x 3, cranial nerves grossly intact. moves all 4 extremities w/o difficulty. Affect pleasant.  ECG: NSR 64. Diffuse artifact in baseline unable to assess STTs  Results for orders placed during the hospital encounter of 08/22/13 (from the past 24 hour(s))  CBC     Status: Abnormal   Collection Time    08/22/13  9:59 AM      Result Value Ref Range   WBC 9.8  4.0 - 10.5 K/uL   RBC 3.70 (*) 4.22 - 5.81 MIL/uL   Hemoglobin 10.7 (*) 13.0 - 17.0 g/dL   HCT 56.2 (*) 13.0 - 86.5 %   MCV 93.5  78.0 - 100.0 fL   MCH 28.9  26.0 - 34.0 pg   MCHC 30.9  30.0 - 36.0 g/dL  RDW 17.1 (*) 11.5 - 15.5 %   Platelets 244  150 - 400 K/uL  BASIC METABOLIC PANEL     Status: Abnormal   Collection Time    08/22/13  9:59 AM      Result Value Ref Range   Sodium 141  137 - 147 mEq/L   Potassium 4.7  3.7 - 5.3 mEq/L   Chloride 101  96 - 112 mEq/L   CO2 25  19 - 32 mEq/L   Glucose, Bld 146 (*) 70 - 99 mg/dL   BUN 28 (*) 6 - 23 mg/dL   Creatinine, Ser 8.650.99  0.50 - 1.35 mg/dL   Calcium 9.4  8.4 - 78.410.5 mg/dL   GFR calc non Af Amer 79 (*) >90 mL/min   GFR calc Af Amer >90  >90 mL/min  PRO B NATRIURETIC PEPTIDE     Status: Abnormal   Collection Time    08/22/13  9:59 AM      Result Value Ref Range   Pro B Natriuretic peptide (BNP)  4028.0 (*) 0 - 125 pg/mL  I-STAT TROPOININ, ED     Status: None   Collection Time    08/22/13 10:16 AM      Result Value Ref Range   Troponin i, poc 0.02  0.00 - 0.08 ng/mL   Comment 3           APTT     Status: None   Collection Time    08/22/13 10:45 AM      Result Value Ref Range   aPTT 36  24 - 37 seconds  PROTIME-INR     Status: Abnormal   Collection Time    08/22/13 10:45 AM      Result Value Ref Range   Prothrombin Time 18.5 (*) 11.6 - 15.2 seconds   INR 1.59 (*) 0.00 - 1.49  SAMPLE TO BLOOD BANK     Status: None   Collection Time    08/22/13 10:45 AM      Result Value Ref Range   Blood Bank Specimen SAMPLE AVAILABLE FOR TESTING     Sample Expiration 08/23/2013    TROPONIN I     Status: None   Collection Time    08/22/13 10:45 AM      Result Value Ref Range   Troponin I <0.30  <0.30 ng/mL  COMPREHENSIVE METABOLIC PANEL     Status: Abnormal   Collection Time    08/22/13 10:45 AM      Result Value Ref Range   Sodium 142  137 - 147 mEq/L   Potassium 3.9  3.7 - 5.3 mEq/L   Chloride 100  96 - 112 mEq/L   CO2 26  19 - 32 mEq/L   Glucose, Bld 129 (*) 70 - 99 mg/dL   BUN 28 (*) 6 - 23 mg/dL   Creatinine, Ser 6.960.99  0.50 - 1.35 mg/dL   Calcium 9.8  8.4 - 29.510.5 mg/dL   Total Protein 8.3  6.0 - 8.3 g/dL   Albumin 4.2  3.5 - 5.2 g/dL   AST 57 (*) 0 - 37 U/L   ALT 62 (*) 0 - 53 U/L   Alkaline Phosphatase 118 (*) 39 - 117 U/L   Total Bilirubin 0.9  0.3 - 1.2 mg/dL   GFR calc non Af Amer 79 (*) >90 mL/min   GFR calc Af Amer >90  >90 mL/min  I-STAT TROPOININ, ED     Status: None   Collection Time  08/22/13 10:51 AM      Result Value Ref Range   Troponin i, poc 0.02  0.00 - 0.08 ng/mL   Comment 3           URINALYSIS, ROUTINE W REFLEX MICROSCOPIC     Status: None   Collection Time    08/22/13 12:35 PM      Result Value Ref Range   Color, Urine YELLOW  YELLOW   APPearance CLEAR  CLEAR   Specific Gravity, Urine 1.023  1.005 - 1.030   pH 7.5  5.0 - 8.0   Glucose, UA  NEGATIVE  NEGATIVE mg/dL   Hgb urine dipstick NEGATIVE  NEGATIVE   Bilirubin Urine NEGATIVE  NEGATIVE   Ketones, ur NEGATIVE  NEGATIVE mg/dL   Protein, ur NEGATIVE  NEGATIVE mg/dL   Urobilinogen, UA 0.2  0.0 - 1.0 mg/dL   Nitrite NEGATIVE  NEGATIVE   Leukocytes, UA NEGATIVE  NEGATIVE   Ct Angio Head W/cm &/or Wo Cm  08/22/2013   CLINICAL DATA:  73 year old male with history of thoracic aneurysm status post repair. Progressive weight gain, swelling and shortness of Breath. Recently thought to have sinus infection, treated with antibiotics and steroids. Symptoms increasing. Initial encounter.  EXAM: CT ANGIOGRAPHY HEAD AND NECK  TECHNIQUE: Multidetector CT imaging of the head and neck was performed using the standard protocol during bolus administration of intravenous contrast. Multiplanar CT image reconstructions and MIPs were obtained to evaluate the vascular anatomy. Carotid stenosis measurements (when applicable) are obtained utilizing NASCET criteria, using the distal internal carotid diameter as the denominator.  CONTRAST:  50mL OMNIPAQUE IOHEXOL 350 MG/ML SOLN  COMPARISON:  Chest abdomen and pelvis CTA from the same day reported separately.  FINDINGS: CTA HEAD FINDINGS  Calvarium intact. Negative scalp soft tissues. Cerebral volume is within normal limits for age. No midline shift, ventriculomegaly, mass effect, evidence of mass lesion, intracranial hemorrhage or evidence of cortically based acute infarction. Gray-white matter differentiation is within normal limits throughout the brain. No abnormal enhancement identified.  VASCULAR FINDINGS:  Major intracranial venous structures are enhancing.  Dominant distal left vertebral artery is patent without stenosis despite some calcified plaque. Retrograde supply to the non dominant distal right vertebral artery. Right PICA and AICA are patent. Dominant left AICA. The basilar artery is patent without stenosis. SCA and PCA origins are within normal limits.  Normal left posterior communicating artery, the right is diminutive or absent. Bilateral PCA branches are within normal limits.  Some cavernous sinus venous contrast artifact is present. Both ICA siphons are patent. Ophthalmic and posterior communicating arteries are within normal limits. Normal carotid termini. Normal MCA and ACA origins. Anterior communicating artery and bilateral ACA branches are within normal limits. Bilateral MCA branches are within normal limits.  Review of the MIP images confirms the above findings.  CTA NECK FINDINGS  Chest findings are reported separately (please see that report). Negative thyroid, larynx, pharynx, parapharyngeal spaces, retropharyngeal space. Extensive dental hardware artifact. Negative visualized sublingual space, submandibular glands and submandibular space. Negative parotid glands. No acute orbits soft tissue findings.  No cervical lymphadenopathy. Mucous retention cyst left maxillary sinus. Other Visualized paranasal sinuses and mastoids are clear. No acute osseous abnormality identified. Degenerative changes in the cervical spine.  VASCULAR FINDINGS:  SEE CHEST CTA FINDINGS FROM TODAY REPORTED SEPARATELY.  Both internal jugular veins are patent. The left subclavian and innominate vein are patent. Negative non contrast appearance of the right subclavian. The right innominate vein is patent.  Great vessel  origins are patent, dolichoectatic. Widespread mild soft and calcified circumferential plaque in the great vessels.  Patent, mildly dolichoectatic right CCA. Patent right carotid bifurcation with no right ICA stenosis despite calcified plaque. Tortuous, mildly dolichoectatic cervical right ICA.  No proximal right subclavian artery stenosis, with some dolichoectasia. Non dominant right vertebral artery is occluded at its origin. Distal right vertebral artery flow is reconstituted briefly at the C2-C3 level, but then re- occludes below the skullbase. Intracranial  findings detailed above.  Patent, the dolichoectatic left CCA. Calcified plaque at the left carotid bifurcation which is significantly affected by dental streak artifact. Loss of enhancement in the left ICA origin and bulb felt to be artifactual (see series 12, image 172). Tortuous/dolichoectatic but otherwise negative cervical left ICA be on that level.  No proximal left subclavian artery stenosis. Dominant left vertebral artery origin widely patent. Tortuous proximal left vertebral artery. Dominant left vertebral artery tortuous and patent to the skullbase.  Review of the MIP images confirms the above findings.  IMPRESSION: 1. Abnormal thoracic aorta, see chest CTA from today reported separately. 2. Patent, dolichoectatic great vessels without arterial dissection in the neck. 3. Occluded non dominant right vertebral artery. Patent, dominant left vertebral artery. 4. Suspect artifactual decreased enhancement of the left ICA origin, related to severe dental hardware artifact. Absence of any proximal left ICA stenosis could be confirmed with either carotid Doppler or neck MRA if necessary. 5. Distal right vertebral artery supplied in a retrograde fashion from the left. Intracranial atherosclerosis, but no intracranial stenosis and otherwise negative intracranial CTA. 6.  No acute intracranial abnormality.   Electronically Signed   By: Augusto Gamble M.D.   On: 08/22/2013 12:26   Dg Chest 2 View  08/22/2013   CLINICAL DATA:  Shortness of breath.  History of aortic dissection.  EXAM: CHEST  2 VIEW  COMPARISON:  CT chest 05/04/2013. Single view of the chest 05/12/2013 and 11 16/2014. PA and lateral chest 05/09/2013.  FINDINGS: There is cardiomegaly and pulmonary edema with small bilateral pleural effusions. The patient is status post aortic valve replacement. Marked dilatation of the aortic arch appears increased since the prior study.  IMPRESSION: Marked increase in size of the aortic arch since the prior exam worrisome  for worsened dissection/aneurysm and possibly hemorrhage. The patient in congestive failure with marked cardiomegaly. CT angiogram of the aorta is recommended.  Critical Value/emergent results were called by telephone at the time of interpretation on 08/22/2013 at 10:26 AM to Dr. Karma Ganja, who verbally acknowledged these results.   Electronically Signed   By: Drusilla Kanner M.D.   On: 08/22/2013 10:28   Ct Angio Neck W/cm &/or Wo/cm  08/22/2013   CLINICAL DATA:  73 year old male with history of thoracic aneurysm status post repair. Progressive weight gain, swelling and shortness of Breath. Recently thought to have sinus infection, treated with antibiotics and steroids. Symptoms increasing. Initial encounter.  EXAM: CT ANGIOGRAPHY HEAD AND NECK  TECHNIQUE: Multidetector CT imaging of the head and neck was performed using the standard protocol during bolus administration of intravenous contrast. Multiplanar CT image reconstructions and MIPs were obtained to evaluate the vascular anatomy. Carotid stenosis measurements (when applicable) are obtained utilizing NASCET criteria, using the distal internal carotid diameter as the denominator.  CONTRAST:  50mL OMNIPAQUE IOHEXOL 350 MG/ML SOLN  COMPARISON:  Chest abdomen and pelvis CTA from the same day reported separately.  FINDINGS: CTA HEAD FINDINGS  Calvarium intact. Negative scalp soft tissues. Cerebral volume is within normal limits for  age. No midline shift, ventriculomegaly, mass effect, evidence of mass lesion, intracranial hemorrhage or evidence of cortically based acute infarction. Gray-white matter differentiation is within normal limits throughout the brain. No abnormal enhancement identified.  VASCULAR FINDINGS:  Major intracranial venous structures are enhancing.  Dominant distal left vertebral artery is patent without stenosis despite some calcified plaque. Retrograde supply to the non dominant distal right vertebral artery. Right PICA and AICA are patent.  Dominant left AICA. The basilar artery is patent without stenosis. SCA and PCA origins are within normal limits. Normal left posterior communicating artery, the right is diminutive or absent. Bilateral PCA branches are within normal limits.  Some cavernous sinus venous contrast artifact is present. Both ICA siphons are patent. Ophthalmic and posterior communicating arteries are within normal limits. Normal carotid termini. Normal MCA and ACA origins. Anterior communicating artery and bilateral ACA branches are within normal limits. Bilateral MCA branches are within normal limits.  Review of the MIP images confirms the above findings.  CTA NECK FINDINGS  Chest findings are reported separately (please see that report). Negative thyroid, larynx, pharynx, parapharyngeal spaces, retropharyngeal space. Extensive dental hardware artifact. Negative visualized sublingual space, submandibular glands and submandibular space. Negative parotid glands. No acute orbits soft tissue findings.  No cervical lymphadenopathy. Mucous retention cyst left maxillary sinus. Other Visualized paranasal sinuses and mastoids are clear. No acute osseous abnormality identified. Degenerative changes in the cervical spine.  VASCULAR FINDINGS:  SEE CHEST CTA FINDINGS FROM TODAY REPORTED SEPARATELY.  Both internal jugular veins are patent. The left subclavian and innominate vein are patent. Negative non contrast appearance of the right subclavian. The right innominate vein is patent.  Great vessel origins are patent, dolichoectatic. Widespread mild soft and calcified circumferential plaque in the great vessels.  Patent, mildly dolichoectatic right CCA. Patent right carotid bifurcation with no right ICA stenosis despite calcified plaque. Tortuous, mildly dolichoectatic cervical right ICA.  No proximal right subclavian artery stenosis, with some dolichoectasia. Non dominant right vertebral artery is occluded at its origin. Distal right vertebral artery  flow is reconstituted briefly at the C2-C3 level, but then re- occludes below the skullbase. Intracranial findings detailed above.  Patent, the dolichoectatic left CCA. Calcified plaque at the left carotid bifurcation which is significantly affected by dental streak artifact. Loss of enhancement in the left ICA origin and bulb felt to be artifactual (see series 12, image 172). Tortuous/dolichoectatic but otherwise negative cervical left ICA be on that level.  No proximal left subclavian artery stenosis. Dominant left vertebral artery origin widely patent. Tortuous proximal left vertebral artery. Dominant left vertebral artery tortuous and patent to the skullbase.  Review of the MIP images confirms the above findings.  IMPRESSION: 1. Abnormal thoracic aorta, see chest CTA from today reported separately. 2. Patent, dolichoectatic great vessels without arterial dissection in the neck. 3. Occluded non dominant right vertebral artery. Patent, dominant left vertebral artery. 4. Suspect artifactual decreased enhancement of the left ICA origin, related to severe dental hardware artifact. Absence of any proximal left ICA stenosis could be confirmed with either carotid Doppler or neck MRA if necessary. 5. Distal right vertebral artery supplied in a retrograde fashion from the left. Intracranial atherosclerosis, but no intracranial stenosis and otherwise negative intracranial CTA. 6.  No acute intracranial abnormality.   Electronically Signed   By: Augusto Gamble M.D.   On: 08/22/2013 12:26   Ct Angio Chest W/cm &/or Wo Cm  08/22/2013   CLINICAL DATA:  Short of breath. Weight gain. Swelling. Abnormal  chest radiograph. Known aortic dissection with change in the appearance of the mediastinum compared to prior.  EXAM: CT ANGIOGRAPHY CHEST, ABDOMEN AND PELVIS  TECHNIQUE: Multidetector CT imaging through the chest, abdomen and pelvis was performed using the standard protocol during bolus administration of intravenous contrast.  Multiplanar reconstructed images and MIPs were obtained and reviewed to evaluate the vascular anatomy.  CONTRAST:  62mL OMNIPAQUE IOHEXOL 350 MG/ML SOLN  COMPARISON:  [place] THORA/PARACENTESIS dated 04/13/2009; [place] THORA/PARACENTESIS dated 03/29/2009; DG ABD PORTABLE 1V dated 02/18/2009; DG CHEST 2 VIEW dated 08/22/2013; CT ANGIO CHEST AORTA W/CM & WO/CM dated 05/04/2013  FINDINGS: CTA CHEST FINDINGS  The patient has undergone aortic root replacement with aortic valve replacement. This appears satisfactory. In the interval since the prior exam, the aortic arch has continued to dilate distal to the isthmus, and the dissection now off shows a large area of communication. The aortic lumen measures 61 mm maximally. The dissection flap remains present within the lumen, thicker than on prior, with the goal opacification of the entire aorta.  There is no hemothorax. No hemopericardium. Tortuous ectatic aortic branch vessels. There is no axillary adenopathy. Small mediastinal lymph nodes are present which are probably congestive. Heart size as enlarged compared to the prior exam with new cardiomegaly since the prior CT. Small bilateral right greater than left pleural effusions are present with associated atelectasis. Scattered foci of airspace opacity are present in the lungs, with upper lobe predominance. Although these could represent areas of infection, given the increase in the heart size, alveolar pulmonary edema is favored. Interlobular septal thickening is present at the apices, also consistent with CHF. Severe coronary artery atherosclerosis is incidentally noted. Thoracic spondylosis. Median sternotomy. No aggressive osseous lesions.  8 mm right upper lobe pulmonary nodule is benign, stable since 2012.  Review of the MIP images confirms the above findings.  CTA ABDOMEN AND PELVIS FINDINGS  The thoracic aortic aneurysm tapers to more normal caliber in the abdomen. Just inferior to the aortic hiatus, the abdominal aorta  measures 29 mm. Ectasia of the infrarenal abdominal aorta measuring 26 mm. Iliofemoral atherosclerosis is present.  Cholelithiasis. Small amount of ascites. Right renal cysts. Prostatomegaly with 54 cm transverse prostate span. Fat containing periumbilical hernia. Small bilateral inguinal hernia is. Left inguinal hernia contains a portion of the sigmoid colon. Severe colonic diverticulosis. Right inguinal hernia sac contains ascites. No aggressive osseous lesions. Lumbar spondylosis and facet arthrosis. Visceral atherosclerosis.  Review of the MIP images confirms the above findings.  IMPRESSION: 1. Cardiomegaly and mild CHF. Right-greater-than-left small bilateral pleural effusions. 2. Multifocal airspace disease in the lungs is favored to represent alveolar edema rather than multifocal pneumonia. 3. 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 Maximal luminal diameter is 62 mm on today's study, increased from 45 mm on the prior exam. The septum from the dissection appears to have torn with the aneurysmal dilation of the aorta. Significantly, no intramural or periaortic hematoma to suggest rupture. 4. Infrarenal abdominal aortic ectasia, cholelithiasis and bilateral inguinal hernias. 5. These results were called by telephone at the time of interpretation on 08/22/2013 at 12:23 PM to Dr. Devoria Albe , who verbally acknowledged these results. 6. Benign 8 mm right upper lobe pulmonary nodule.   Electronically Signed   By: Andreas Newport M.D.   On: 08/22/2013 12:24   Ct Angio Abd/pel W/ And/or W/o  08/22/2013   CLINICAL DATA:  Short of breath. Weight gain. Swelling. Abnormal  chest radiograph. Known aortic dissection with change in the appearance of the mediastinum compared to prior.  EXAM: CT ANGIOGRAPHY CHEST, ABDOMEN AND PELVIS  TECHNIQUE: Multidetector CT imaging through the chest, abdomen and pelvis was performed using the standard protocol during bolus  administration of intravenous contrast. Multiplanar reconstructed images and MIPs were obtained and reviewed to evaluate the vascular anatomy.  CONTRAST:  75mL OMNIPAQUE IOHEXOL 350 MG/ML SOLN  COMPARISON:  [place] THORA/PARACENTESIS dated 04/13/2009; [place] THORA/PARACENTESIS dated 03/29/2009; DG ABD PORTABLE 1V dated 02/18/2009; DG CHEST 2 VIEW dated 08/22/2013; CT ANGIO CHEST AORTA W/CM & WO/CM dated 05/04/2013  FINDINGS: CTA CHEST FINDINGS  The patient has undergone aortic root replacement with aortic valve replacement. This appears satisfactory. In the interval since the prior exam, the aortic arch has continued to dilate distal to the isthmus, and the dissection now off shows a large area of communication. The aortic lumen measures 61 mm maximally. The dissection flap remains present within the lumen, thicker than on prior, with the goal opacification of the entire aorta.  There is no hemothorax. No hemopericardium. Tortuous ectatic aortic branch vessels. There is no axillary adenopathy. Small mediastinal lymph nodes are present which are probably congestive. Heart size as enlarged compared to the prior exam with new cardiomegaly since the prior CT. Small bilateral right greater than left pleural effusions are present with associated atelectasis. Scattered foci of airspace opacity are present in the lungs, with upper lobe predominance. Although these could represent areas of infection, given the increase in the heart size, alveolar pulmonary edema is favored. Interlobular septal thickening is present at the apices, also consistent with CHF. Severe coronary artery atherosclerosis is incidentally noted. Thoracic spondylosis. Median sternotomy. No aggressive osseous lesions.  8 mm right upper lobe pulmonary nodule is benign, stable since 2012.  Review of the MIP images confirms the above findings.  CTA ABDOMEN AND PELVIS FINDINGS  The thoracic aortic aneurysm tapers to more normal caliber in the abdomen. Just inferior to the  aortic hiatus, the abdominal aorta measures 29 mm. Ectasia of the infrarenal abdominal aorta measuring 26 mm. Iliofemoral atherosclerosis is present.  Cholelithiasis. Small amount of ascites. Right renal cysts. Prostatomegaly with 54 cm transverse prostate span. Fat containing periumbilical hernia. Small bilateral inguinal hernia is. Left inguinal hernia contains a portion of the sigmoid colon. Severe colonic diverticulosis. Right inguinal hernia sac contains ascites. No aggressive osseous lesions. Lumbar spondylosis and facet arthrosis. Visceral atherosclerosis.  Review of the MIP images confirms the above findings.  IMPRESSION: 1. Cardiomegaly and mild CHF. Right-greater-than-left small bilateral pleural effusions. 2. Multifocal airspace disease in the lungs is favored to represent alveolar edema rather than multifocal pneumonia. 3. 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 Maximal luminal diameter is 62 mm on today's study, increased from 45 mm on the prior exam. The septum from the dissection appears to have torn with the aneurysmal dilation of the aorta. Significantly, no intramural or periaortic hematoma to suggest rupture. 4. Infrarenal abdominal aortic ectasia, cholelithiasis and bilateral inguinal hernias. 5. These results were called by telephone at the time of interpretation on 08/22/2013 at 12:23 PM to Dr. Devoria Albe , who verbally acknowledged these results. 6. Benign 8 mm right upper lobe pulmonary nodule.   Electronically Signed   By: Andreas Newport M.D.   On: 08/22/2013 12:24     ASSESSMENT: 1. Acute/chronic diastolic HF 2. 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 PAF in NSR on amio  5. CAD s/p LCX BMS in 2012 6. HTN  PLAN/DISCUSSION:  Case d/w Dr. Tyrone Sage. Will admit to SDU for treatment of HF with IV lasix. Will check echo. Once tuned up will  need operative repair of aneurysm/dissection.  Will hold Eliquis. Will not use full dose heparin given risk of bleed. Use DVT dose. Continue b-blocker and CCB for HR and BP control.   Daniel Bensimhon,MD 5:05 PM

## 2013-08-22 NOTE — Progress Notes (Signed)
Patient ID: [name] [name], male   DOB: August 04, 1940, 73 y.o.   MRN: [id number]      [address]       [place] [address]             [phone]        [name] Record #[id number] Date of Birth: 03-21-1941  Referring: Dr Lynelle Doctor- ER Primary Care: [name]., MD  Chief Complaint:    Chief Complaint  Patient presents with  . 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 Since that time he's had an acute myocardial infarction and had a coronary stent placed. In November 2014 prior to his yearly followup he [facility] with an acute type III aortic dissection. He was treated medically, during the  hospital stay he did develop persistent atrial fibrillation and ultimately was discharged home on anticoagulation.   One week ago the patient was to come into the office with a followup CT scan of the chest, do to the snow did not make it to the appointment. He now comes to the emergency room with increasing symptoms of congestive heart failure. He's had increasing lower extremity edema in spite of increasing his Lasix to 80 mg twice a day for the last several days. In addition he's been unable to sleep for several days because of difficulty breathing when he lays flat. He has not noted any rapid heart rate. He denies any chest pain, specifically denying any pain similar to the pain at the time of his dissection in November.  Patient is currently on a Eliquis   Current Activity/ Functional Status: Mobility/Ambulation: Independent with mobility prior to admission in the home on all surfaces  with no assistive devices for unlimited distances.   Zubrod Score: At the time of surgery this patient's most appropriate activity status/level should be described as: []  Normal activity, no symptoms []  Symptoms, fully ambulatory [x]  Symptoms, in bed less than or equal to 50% of the time []  Symptoms, in bed greater than 50% of the time but less than 100% []  Bedridden []  Moribund   Past Medical History  Diagnosis Date  . Hyperlipidemia   . DVT (deep venous thrombosis)     post knee surgery  . Tobacco abuse   . HTN (hypertension)   . Descending aortic aneurysm   . Aortic valve insufficiency   . Thoracic aneurysm   . S/P AVR (aortic valve replacement)   . CAD (coronary artery disease)   . Atrial flutter     Past Surgical History  Procedure Laterality Date  . Right groin lymphocele  [id number]  . Aortic valve replacement  [id number]  . Replacement total knee      bilateral    History  Smoking status  . Former Smoker  . Types: Cigarettes  . Quit date: 04/16/1974  Smokeless tobacco  . Never Used   History  Alcohol Use No      Allergies  Allergen Reactions  . Lortab [Hydrocodone-Acetaminophen] Hives  . Morphine And Related Hives    Current Facility-Administered Medications  Medication Dose Route Frequency Provider Last Rate Last Dose  .  labetalol (NORMODYNE,TRANDATE) 4 mg/mL in dextrose 5 % 125 mL infusion  0.5-3 mg/min Intravenous Titrated Ward Givens, MD 22.5 mL/hr at 08/22/13 1342 1.5 mg/min at 08/22/13 1342   Current Outpatient Prescriptions  Medication Sig Dispense Refill  . amiodarone (PACERONE) 400 MG tablet Take 1 tablet (400 mg total) by mouth daily.  60 tablet  3  . apixaban (ELIQUIS) 5 MG TABS tablet Take 5 mg by mouth 2 (two) times daily.      Marland Kitchen aspirin EC 81 MG tablet Take 81 mg by mouth daily.        Marland Kitchen atorvastatin (LIPITOR) 10 MG tablet Take 1 tablet (10 mg total) by mouth daily.  90 tablet  3  . diltiazem (CARDIZEM CD) 240 MG 24 hr capsule Take 1  capsule (240 mg total) by mouth 2 (two) times daily.  60 capsule  11  . enalapril (VASOTEC) 5 MG tablet Take 5 mg by mouth daily.        . furosemide (LASIX) 40 MG tablet Take 1 tablet (40 mg total) by mouth 2 (two) times daily.  60 tablet  11  . lansoprazole (PREVACID) 30 MG capsule Take 30 mg by mouth daily.       Marland Kitchen loratadine (CLARITIN) 10 MG tablet Take 10 mg by mouth daily.        . metoprolol (LOPRESSOR) 100 MG tablet Take 1 tablet (100 mg total) by mouth 2 (two) times daily.  60 tablet  7  . Multiple Vitamin (MULTIVITAMIN WITH MINERALS) TABS tablet Take 1 tablet by mouth daily.      . nitroGLYCERIN (NITROSTAT) 0.4 MG SL tablet Place 0.4 mg under the tongue every 5 (five) minutes as needed.        . potassium chloride SA (K-DUR,KLOR-CON) 20 MEQ tablet Take 2 tablets (40 mEq total) by mouth daily.  60 tablet  11       Review of Systems:    Review of Systems  Constitutional: Nofever and malaise/fatigue. Negative for chills and weight loss.  HENT: Positive for nosebleeds. In the past but since discharge in November has had no further nosebleeds  Eyes: Negative.   Respiratory: Negative for hemoptysis, sputum production and wheezing.   Cardiovascular: Positive for palpitations and leg swelling and orthopnea Negative for chest pain, orthopnea, claudication and PND.  Gastrointestinal: Negative.   Genitourinary: Negative.   Musculoskeletal: Positive for myalgias and joint pain.  Skin: Negative.   Neurological: Positive for dizziness and weakness. Negative for tingling, tremors, sensory change, speech change, focal weakness, seizures and loss of consciousness.  Endo/Heme/Allergies: Negative.   Psychiatric/Behavioral: Negative.     Physical Exam: BP 152/45  Pulse 58  Temp(Src) 97.7 F (36.5 C)  Resp 16  Ht 5\' 10"  (1.778 m)  Wt 191 lb 3.2 oz (86.728 kg)  BMI 27.43 kg/m2  SpO2 96%  General appearance: alert, cooperative, appears older than stated age, fatigued, no distress and  slowed mentation Neurologic: intact Heart: regular rate and rhythm, S1, S2 normal, no murmur, click, rub or gallop, normal apical impulse, no click and no rub, patient has significant jugular venous distention Lungs: clear to auscultation bilaterally Abdomen: soft, non-tender; bowel sounds normal; no masses,  no organomegaly Extremities: extremities normal, atraumatic, no cyanosis 3+ edema in both lower extremities  Wound: sternum stable He does not appear to be in atrial fibrillation on exam today  Diagnostic Studies & Laboratory data:     Recent Radiology Findings:  Ct Angio Head W/cm &/or Wo Cm  08/22/2013   CLINICAL DATA:  73 year old male with history of thoracic aneurysm status post repair. Progressive weight gain, swelling and shortness of Breath. Recently thought to have sinus infection, treated with antibiotics and steroids. Symptoms increasing. Initial encounter.  EXAM: CT ANGIOGRAPHY HEAD AND NECK  TECHNIQUE: Multidetector CT imaging of the head and neck was performed using the standard protocol during bolus administration of intravenous contrast. Multiplanar CT image reconstructions and MIPs were obtained to evaluate the vascular anatomy. Carotid stenosis measurements (when applicable) are obtained utilizing NASCET criteria, using the distal internal carotid diameter as the denominator.  CONTRAST:  50mL OMNIPAQUE IOHEXOL 350 MG/ML SOLN  COMPARISON:  Chest abdomen and pelvis CTA from the same day reported separately.  FINDINGS: CTA HEAD FINDINGS  Calvarium intact. Negative scalp soft tissues. Cerebral volume is within normal limits for age. No midline shift, ventriculomegaly, mass effect, evidence of mass lesion, intracranial hemorrhage or evidence of cortically based acute infarction. Gray-white matter differentiation is within normal limits throughout the brain. No abnormal enhancement identified.  VASCULAR FINDINGS:  Major intracranial venous structures are enhancing.  Dominant distal left  vertebral artery is patent without stenosis despite some calcified plaque. Retrograde supply to the non dominant distal right vertebral artery. Right PICA and AICA are patent. Dominant left AICA. The basilar artery is patent without stenosis. SCA and PCA origins are within normal limits. Normal left posterior communicating artery, the right is diminutive or absent. Bilateral PCA branches are within normal limits.  Some cavernous sinus venous contrast artifact is present. Both ICA siphons are patent. Ophthalmic and posterior communicating arteries are within normal limits. Normal carotid termini. Normal MCA and ACA origins. Anterior communicating artery and bilateral ACA branches are within normal limits. Bilateral MCA branches are within normal limits.  Review of the MIP images confirms the above findings.  CTA NECK FINDINGS  Chest findings are reported separately (please see that report). Negative thyroid, larynx, pharynx, parapharyngeal spaces, retropharyngeal space. Extensive dental hardware artifact. Negative visualized sublingual space, submandibular glands and submandibular space. Negative parotid glands. No acute orbits soft tissue findings.  No cervical lymphadenopathy. Mucous retention cyst left maxillary sinus. Other Visualized paranasal sinuses and mastoids are clear. No acute osseous abnormality identified. Degenerative changes in the cervical spine.  VASCULAR FINDINGS:  SEE CHEST CTA FINDINGS FROM TODAY REPORTED SEPARATELY.  Both internal jugular veins are patent. The left subclavian and innominate vein are patent. Negative non contrast appearance of the right subclavian. The right innominate vein is patent.  Great vessel origins are patent, dolichoectatic. Widespread mild soft and calcified circumferential plaque in the great vessels.  Patent, mildly dolichoectatic right CCA. Patent right carotid bifurcation with no right ICA stenosis despite calcified plaque. Tortuous, mildly dolichoectatic cervical  right ICA.  No proximal right subclavian artery stenosis, with some dolichoectasia. Non dominant right vertebral artery is occluded at its origin. Distal right vertebral artery flow is reconstituted briefly at the C2-C3 level, but then re- occludes below the skullbase. Intracranial findings detailed above.  Patent, the dolichoectatic left CCA. Calcified plaque at the left carotid bifurcation which is significantly affected by dental streak artifact. Loss of enhancement in the left ICA origin and bulb felt to be artifactual (see series 12, image 172). Tortuous/dolichoectatic but otherwise negative cervical left ICA be on that level.  No proximal left subclavian artery stenosis. Dominant left vertebral artery origin widely patent. Tortuous proximal left vertebral artery. Dominant left vertebral artery tortuous and patent to the skullbase.  Review of the MIP images confirms the  above findings.  IMPRESSION: 1. Abnormal thoracic aorta, see chest CTA from today reported separately. 2. Patent, dolichoectatic great vessels without arterial dissection in the neck. 3. Occluded non dominant right vertebral artery. Patent, dominant left vertebral artery. 4. Suspect artifactual decreased enhancement of the left ICA origin, related to severe dental hardware artifact. Absence of any proximal left ICA stenosis could be confirmed with either carotid Doppler or neck MRA if necessary. 5. Distal right vertebral artery supplied in a retrograde fashion from the left. Intracranial atherosclerosis, but no intracranial stenosis and otherwise negative intracranial CTA. 6.  No acute intracranial abnormality.   Electronically Signed   By: Augusto Gamble M.D.   On: 08/22/2013 12:26   Dg Chest 2 View  08/22/2013   CLINICAL DATA:  Shortness of breath.  History of aortic dissection.  EXAM: CHEST  2 VIEW  COMPARISON:  CT chest 05/04/2013. Single view of the chest 05/12/2013 and 11 16/2014. PA and lateral chest 05/09/2013.  FINDINGS: There is  cardiomegaly and pulmonary edema with small bilateral pleural effusions. The patient is status post aortic valve replacement. Marked dilatation of the aortic arch appears increased since the prior study.  IMPRESSION: Marked increase in size of the aortic arch since the prior exam worrisome for worsened dissection/aneurysm and possibly hemorrhage. The patient in congestive failure with marked cardiomegaly. CT angiogram of the aorta is recommended.  Critical Value/emergent results were called by telephone at the time of interpretation on 08/22/2013 at 10:26 AM to Dr. Karma Ganja, who verbally acknowledged these results.   Electronically Signed   By: Drusilla Kanner M.D.   On: 08/22/2013 10:28   Ct Angio Neck W/cm &/or Wo/cm  08/22/2013   CLINICAL DATA:  73 year old male with history of thoracic aneurysm status post repair. Progressive weight gain, swelling and shortness of Breath. Recently thought to have sinus infection, treated with antibiotics and steroids. Symptoms increasing. Initial encounter.  EXAM: CT ANGIOGRAPHY HEAD AND NECK  TECHNIQUE: Multidetector CT imaging of the head and neck was performed using the standard protocol during bolus administration of intravenous contrast. Multiplanar CT image reconstructions and MIPs were obtained to evaluate the vascular anatomy. Carotid stenosis measurements (when applicable) are obtained utilizing NASCET criteria, using the distal internal carotid diameter as the denominator.  CONTRAST:  50mL OMNIPAQUE IOHEXOL 350 MG/ML SOLN  COMPARISON:  Chest abdomen and pelvis CTA from the same day reported separately.  FINDINGS: CTA HEAD FINDINGS  Calvarium intact. Negative scalp soft tissues. Cerebral volume is within normal limits for age. No midline shift, ventriculomegaly, mass effect, evidence of mass lesion, intracranial hemorrhage or evidence of cortically based acute infarction. Gray-white matter differentiation is within normal limits throughout the brain. No abnormal  enhancement identified.  VASCULAR FINDINGS:  Major intracranial venous structures are enhancing.  Dominant distal left vertebral artery is patent without stenosis despite some calcified plaque. Retrograde supply to the non dominant distal right vertebral artery. Right PICA and AICA are patent. Dominant left AICA. The basilar artery is patent without stenosis. SCA and PCA origins are within normal limits. Normal left posterior communicating artery, the right is diminutive or absent. Bilateral PCA branches are within normal limits.  Some cavernous sinus venous contrast artifact is present. Both ICA siphons are patent. Ophthalmic and posterior communicating arteries are within normal limits. Normal carotid termini. Normal MCA and ACA origins. Anterior communicating artery and bilateral ACA branches are within normal limits. Bilateral MCA branches are within normal limits.  Review of the MIP images confirms the above findings.  CTA NECK FINDINGS  Chest findings are reported separately (please see that report). Negative thyroid, larynx, pharynx, parapharyngeal spaces, retropharyngeal space. Extensive dental hardware artifact. Negative visualized sublingual space, submandibular glands and submandibular space. Negative parotid glands. No acute orbits soft tissue findings.  No cervical lymphadenopathy. Mucous retention cyst left maxillary sinus. Other Visualized paranasal sinuses and mastoids are clear. No acute osseous abnormality identified. Degenerative changes in the cervical spine.  VASCULAR FINDINGS:  SEE CHEST CTA FINDINGS FROM TODAY REPORTED SEPARATELY.  Both internal jugular veins are patent. The left subclavian and innominate vein are patent. Negative non contrast appearance of the right subclavian. The right innominate vein is patent.  Great vessel origins are patent, dolichoectatic. Widespread mild soft and calcified circumferential plaque in the great vessels.  Patent, mildly dolichoectatic right CCA. Patent  right carotid bifurcation with no right ICA stenosis despite calcified plaque. Tortuous, mildly dolichoectatic cervical right ICA.  No proximal right subclavian artery stenosis, with some dolichoectasia. Non dominant right vertebral artery is occluded at its origin. Distal right vertebral artery flow is reconstituted briefly at the C2-C3 level, but then re- occludes below the skullbase. Intracranial findings detailed above.  Patent, the dolichoectatic left CCA. Calcified plaque at the left carotid bifurcation which is significantly affected by dental streak artifact. Loss of enhancement in the left ICA origin and bulb felt to be artifactual (see series 12, image 172). Tortuous/dolichoectatic but otherwise negative cervical left ICA be on that level.  No proximal left subclavian artery stenosis. Dominant left vertebral artery origin widely patent. Tortuous proximal left vertebral artery. Dominant left vertebral artery tortuous and patent to the skullbase.  Review of the MIP images confirms the above findings.  IMPRESSION: 1. Abnormal thoracic aorta, see chest CTA from today reported separately. 2. Patent, dolichoectatic great vessels without arterial dissection in the neck. 3. Occluded non dominant right vertebral artery. Patent, dominant left vertebral artery. 4. Suspect artifactual decreased enhancement of the left ICA origin, related to severe dental hardware artifact. Absence of any proximal left ICA stenosis could be confirmed with either carotid Doppler or neck MRA if necessary. 5. Distal right vertebral artery supplied in a retrograde fashion from the left. Intracranial atherosclerosis, but no intracranial stenosis and otherwise negative intracranial CTA. 6.  No acute intracranial abnormality.   Electronically Signed   By: Augusto Gamble M.D.   On: 08/22/2013 12:26   Ct Angio Chest W/cm &/or Wo Cm  08/22/2013   CLINICAL DATA:  Short of breath. Weight gain. Swelling. Abnormal chest radiograph. Known aortic  dissection with change in the appearance of the mediastinum compared to prior.  EXAM: CT ANGIOGRAPHY CHEST, ABDOMEN AND PELVIS  TECHNIQUE: Multidetector CT imaging through the chest, abdomen and pelvis was performed using the standard protocol during bolus administration of intravenous contrast. Multiplanar reconstructed images and MIPs were obtained and reviewed to evaluate the vascular anatomy.  CONTRAST:  75mL OMNIPAQUE IOHEXOL 350 MG/ML SOLN  COMPARISON:  [place] THORA/PARACENTESIS dated 04/13/2009; [place] THORA/PARACENTESIS dated 03/29/2009; DG ABD PORTABLE 1V dated 02/18/2009; DG CHEST 2 VIEW dated 08/22/2013; CT ANGIO CHEST AORTA W/CM & WO/CM dated 05/04/2013  FINDINGS: CTA CHEST FINDINGS  The patient has undergone aortic root replacement with aortic valve replacement. This appears satisfactory. In the interval since the prior exam, the aortic arch has continued to dilate distal to the isthmus, and the dissection now off shows a large area of communication. The aortic lumen measures 61 mm maximally. The dissection flap remains present within the lumen, thicker than on prior,  with the goal opacification of the entire aorta.  There is no hemothorax. No hemopericardium. Tortuous ectatic aortic branch vessels. There is no axillary adenopathy. Small mediastinal lymph nodes are present which are probably congestive. Heart size as enlarged compared to the prior exam with new cardiomegaly since the prior CT. Small bilateral right greater than left pleural effusions are present with associated atelectasis. Scattered foci of airspace opacity are present in the lungs, with upper lobe predominance. Although these could represent areas of infection, given the increase in the heart size, alveolar pulmonary edema is favored. Interlobular septal thickening is present at the apices, also consistent with CHF. Severe coronary artery atherosclerosis is incidentally noted. Thoracic spondylosis. Median sternotomy. No aggressive osseous  lesions.  8 mm right upper lobe pulmonary nodule is benign, stable since 2012.  Review of the MIP images confirms the above findings.  CTA ABDOMEN AND PELVIS FINDINGS  The thoracic aortic aneurysm tapers to more normal caliber in the abdomen. Just inferior to the aortic hiatus, the abdominal aorta measures 29 mm. Ectasia of the infrarenal abdominal aorta measuring 26 mm. Iliofemoral atherosclerosis is present.  Cholelithiasis. Small amount of ascites. Right renal cysts. Prostatomegaly with 54 cm transverse prostate span. Fat containing periumbilical hernia. Small bilateral inguinal hernia is. Left inguinal hernia contains a portion of the sigmoid colon. Severe colonic diverticulosis. Right inguinal hernia sac contains ascites. No aggressive osseous lesions. Lumbar spondylosis and facet arthrosis. Visceral atherosclerosis.  Review of the MIP images confirms the above findings.  IMPRESSION: 1. Cardiomegaly and mild CHF. Right-greater-than-left small bilateral pleural effusions. 2. Multifocal airspace disease in the lungs is favored to represent alveolar edema rather than multifocal pneumonia. 3. 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 Maximal luminal diameter is 62 mm on today's study, increased from 45 mm on the prior exam. The septum from the dissection appears to have torn with the aneurysmal dilation of the aorta. Significantly, no intramural or periaortic hematoma to suggest rupture. 4. Infrarenal abdominal aortic ectasia, cholelithiasis and bilateral inguinal hernias. 5. These results were called by telephone at the time of interpretation on 08/22/2013 at 12:23 PM to Dr. Devoria Albe , who verbally acknowledged these results. 6. Benign 8 mm right upper lobe pulmonary nodule.   Electronically Signed   By: Andreas Newport M.D.   On: 08/22/2013 12:24   Ct Angio Abd/pel W/ And/or W/o  08/22/2013   CLINICAL DATA:  Short of breath. Weight gain. Swelling.  Abnormal chest radiograph. Known aortic dissection with change in the appearance of the mediastinum compared to prior.  EXAM: CT ANGIOGRAPHY CHEST, ABDOMEN AND PELVIS  TECHNIQUE: Multidetector CT imaging through the chest, abdomen and pelvis was performed using the standard protocol during bolus administration of intravenous contrast. Multiplanar reconstructed images and MIPs were obtained and reviewed to evaluate the vascular anatomy.  CONTRAST:  45mL OMNIPAQUE IOHEXOL 350 MG/ML SOLN  COMPARISON:  [place] THORA/PARACENTESIS dated 04/13/2009; [place] THORA/PARACENTESIS dated 03/29/2009; DG ABD PORTABLE 1V dated 02/18/2009; DG CHEST 2 VIEW dated 08/22/2013; CT ANGIO CHEST AORTA W/CM & WO/CM dated 05/04/2013  FINDINGS: CTA CHEST FINDINGS  The patient has undergone aortic root replacement with aortic valve replacement. This appears satisfactory. In the interval since the prior exam, the aortic arch has continued to dilate distal to the isthmus, and the dissection now off shows a large area of communication. The aortic lumen measures 61 mm maximally. The dissection flap remains present within the lumen, thicker than on prior,  with the goal opacification of the entire aorta.  There is no hemothorax. No hemopericardium. Tortuous ectatic aortic branch vessels. There is no axillary adenopathy. Small mediastinal lymph nodes are present which are probably congestive. Heart size as enlarged compared to the prior exam with new cardiomegaly since the prior CT. Small bilateral right greater than left pleural effusions are present with associated atelectasis. Scattered foci of airspace opacity are present in the lungs, with upper lobe predominance. Although these could represent areas of infection, given the increase in the heart size, alveolar pulmonary edema is favored. Interlobular septal thickening is present at the apices, also consistent with CHF. Severe coronary artery atherosclerosis is incidentally noted. Thoracic spondylosis. Median  sternotomy. No aggressive osseous lesions.  8 mm right upper lobe pulmonary nodule is benign, stable since 2012.  Review of the MIP images confirms the above findings.  CTA ABDOMEN AND PELVIS FINDINGS  The thoracic aortic aneurysm tapers to more normal caliber in the abdomen. Just inferior to the aortic hiatus, the abdominal aorta measures 29 mm. Ectasia of the infrarenal abdominal aorta measuring 26 mm. Iliofemoral atherosclerosis is present.  Cholelithiasis. Small amount of ascites. Right renal cysts. Prostatomegaly with 54 cm transverse prostate span. Fat containing periumbilical hernia. Small bilateral inguinal hernia is. Left inguinal hernia contains a portion of the sigmoid colon. Severe colonic diverticulosis. Right inguinal hernia sac contains ascites. No aggressive osseous lesions. Lumbar spondylosis and facet arthrosis. Visceral atherosclerosis.  Review of the MIP images confirms the above findings.  IMPRESSION: 1. Cardiomegaly and mild CHF. Right-greater-than-left small bilateral pleural effusions. 2. Multifocal airspace disease in the lungs is favored to represent alveolar edema rather than multifocal pneumonia. 3. 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 Maximal luminal diameter is 62 mm on today's study, increased from 45 mm on the prior exam. The septum from the dissection appears to have torn with the aneurysmal dilation of the aorta. Significantly, no intramural or periaortic hematoma to suggest rupture. 4. Infrarenal abdominal aortic ectasia, cholelithiasis and bilateral inguinal hernias. 5. These results were called by telephone at the time of interpretation on 08/22/2013 at 12:23 PM to Dr. Devoria Albe , who verbally acknowledged these results. 6. Benign 8 mm right upper lobe pulmonary nodule.   Electronically Signed   By: Andreas Newport M.D.   On: 08/22/2013 12:24    CLINICAL DATA: Chest pain radiating to the back.  EXAM:  CT  ANGIOGRAPHY CHEST WITH CONTRAST  TECHNIQUE:  Multidetector CT imaging of the chest was performed using the  standard protocol during bolus administration of intravenous  contrast. Multiplanar CT image reconstructions including MIPs were  obtained to evaluate the vascular anatomy.  CONTRAST: OMNIPAQUE IOHEXOL 350 MG/ML SOLN  COMPARISON: CTA of the chest performed 05/30/2012  FINDINGS:  The thoracic aorta is dilated to 4.5 cm in maximal diameter, perhaps  slightly more prominent than on the prior study. There is a new  dissection flap arising from the distal aspect of the aortic arch,  with a single fenestration at the proximal aspect of the dissection,  and decreased enhancement of blood within the false lumen. The  distal aspect of the false lumen appears to be fully clotted; the  false lumen resolves just proximal to the level of the diaphragm.  This causes mild luminal narrowing along the descending thoracic  aorta.  The patient is status post ascending aortic repair; postoperative  change is unremarkable in appearance. The great vessels are  unremarkable in appearance. There is no evidence of pulmonary  embolus.  Mild left basilar atelectasis is noted. Minimal emphysematous change  is noted at the lung apices. The lungs are otherwise clear. There is  no evidence of pleural effusion or pneumothorax. No masses are  identified.  No mediastinal lymphadenopathy is appreciated. No pericardial  effusion is seen. The patient is status post median sternotomy. No  axillary lymphadenopathy is seen. The thyroid gland is unremarkable  in appearance.  The visualized portions of the liver and spleen are unremarkable. A  3.2 cm cyst is noted at the upper pole of the right kidney.  No acute osseous abnormalities are seen. Mild degenerative change is  noted at the lower cervical spine.  Review of the MIP images confirms the above findings.  IMPRESSION:  1. New dissection flap arising from  the distal aspect of the aortic  arch, extending along the descending thoracic aorta and resolving  just proximal to the level of the diaphragm. No evidence of  involvement of branch vessels. The distal aspect of the false lumen  appears to be fully clotted, while there is decreased enhancement of  blood within the more proximal false lumen, with a single  fenestration noted at the proximal aspect of the dissection.  Associated mild luminal narrowing along the descending thoracic  aorta. The thoracic aorta measures 4.5 cm in maximal diameter,  perhaps slightly more prominent than on the prior study.  2. Ascending thoracic aorta repair is unremarkable in appearance.  3. No evidence of pulmonary embolus.  4. Mild left basilar atelectasis noted; minimal emphysematous change  at the lung apices. Lungs otherwise clear.  5. Right renal cyst noted.  These results were called by telephone at the time of interpretation  on 05/04/2013 at 4:10 AM to Dr. Brandt Loosen , who verbally  acknowledged these results.  Electronically Signed  By: Roanna Raider M.D.  On: 05/04/2013 04:27   Recent Lab Findings: Lab Results  Component Value Date   WBC 9.8 08/22/2013   HGB 10.7* 08/22/2013   HCT 34.6* 08/22/2013   PLT 244 08/22/2013   GLUCOSE 129* 08/22/2013   CHOL  Value: 136        ATP III CLASSIFICATION:  <200     mg/dL   Desirable  295-621  mg/dL   Borderline High  >=308    mg/dL   High        6/57/8469   TRIG 89 07/29/2010   HDL 38* 07/29/2010   LDLCALC  Value: 80        Total Cholesterol/HDL:CHD Risk Coronary Heart Disease Risk Table                     Men   Women  1/2 Average Risk   3.4   3.3  Average Risk       5.0   4.4  2 X Average Risk   9.6   7.1  3 X Average Risk  23.4   11.0        Use the calculated Patient Ratio above and the CHD Risk Table to determine the patient's CHD Risk.        ATP III CLASSIFICATION (LDL):  <100     mg/dL   Optimal  629-528  mg/dL   Near or Above                    Optimal   130-159  mg/dL   Borderline  413-244  mg/dL   High  >161>190     mg/dL   Very High 0/96/04542/03/2011   ALT 62* 08/22/2013   AST 57* 08/22/2013   NA 142 08/22/2013   K 3.9 08/22/2013   CL 100 08/22/2013   CREATININE 0.99 08/22/2013   BUN 28* 08/22/2013   CO2 26 08/22/2013   TSH 1.102 05/08/2013   INR 1.59* 08/22/2013   HGBA1C 5.8* 05/08/2013      Assessment / Plan:    Patient admitted with increasing symptoms of acute on chronic systolic congestive heart failure and followup CT scan of his type III aortic dissection or shows enlargement of the proximal descending aorta since November. 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 CTA of the cerebral vessels reveal a dominant left vertebral artery. At the patient's original operation the ascending aorta and arch were placed past the left carotid takeoff. The left carotid right carotid and innominate artery, off a bifurcated graft arising from the anterior surface of the ascending aorta. Placement of a thoracic stent graft from the previously placed graft to the descending aorta, with coverage of the left subclavian artery. The patient will require  carotid to subclavian bypass. I discussed the case with Dr. Myra GianottiBrabham. Tentatively we will plan to proceed on Tuesday, if the patient's congestive heart failure and renal function is stable.  . Hyperlipidemia   . DVT (deep venous thrombosis)     post knee surgery  . Tobacco abuse   . HTN (hypertension)   . Descending aortic aneurysm   . S/P AVR (aortic valve replacement) Biological Bent[place] and arch replacement   . CAD (coronary artery disease)/ coronary stent placed    . Atrial flutter     Delight OvensEdward B Lavonia Eager MD 08/22/2013 4:54 PM

## 2013-08-22 NOTE — ED Notes (Signed)
Patient taken to CT with Laverda Page, RN

## 2013-08-23 DIAGNOSIS — I498 Other specified cardiac arrhythmias: Secondary | ICD-10-CM

## 2013-08-23 DIAGNOSIS — I509 Heart failure, unspecified: Secondary | ICD-10-CM

## 2013-08-23 DIAGNOSIS — R001 Bradycardia, unspecified: Secondary | ICD-10-CM

## 2013-08-23 DIAGNOSIS — I369 Nonrheumatic tricuspid valve disorder, unspecified: Secondary | ICD-10-CM

## 2013-08-23 HISTORY — DX: Bradycardia, unspecified: R00.1

## 2013-08-23 LAB — COMPREHENSIVE METABOLIC PANEL
ALT: 44 U/L (ref 0–53)
AST: 63 U/L — AB (ref 0–37)
Albumin: 2.9 g/dL — ABNORMAL LOW (ref 3.5–5.2)
Alkaline Phosphatase: 74 U/L (ref 39–117)
BILIRUBIN TOTAL: 0.6 mg/dL (ref 0.3–1.2)
BUN: 27 mg/dL — ABNORMAL HIGH (ref 6–23)
CALCIUM: 8.5 mg/dL (ref 8.4–10.5)
CHLORIDE: 102 meq/L (ref 96–112)
CO2: 23 meq/L (ref 19–32)
Creatinine, Ser: 1.12 mg/dL (ref 0.50–1.35)
GFR calc Af Amer: 73 mL/min — ABNORMAL LOW (ref >90)
GFR calc non Af Amer: 63 mL/min — ABNORMAL LOW (ref >90)
Glucose, Bld: 111 mg/dL — ABNORMAL HIGH (ref 70–99)
Potassium: 5.6 mEq/L — ABNORMAL HIGH (ref 3.7–5.3)
SODIUM: 140 meq/L (ref 137–147)
Total Protein: 6 g/dL (ref 6.0–8.3)

## 2013-08-23 MED ORDER — HYDRALAZINE HCL 20 MG/ML IJ SOLN
10.0000 mg | Freq: Once | INTRAMUSCULAR | Status: AC
  Administered 2013-08-23: 10 mg via INTRAVENOUS
  Filled 2013-08-23: qty 1

## 2013-08-23 MED ORDER — ATROPINE SULFATE 0.1 MG/ML IJ SOLN
INTRAMUSCULAR | Status: AC
  Filled 2013-08-23: qty 10

## 2013-08-23 MED ORDER — ATROPINE SULFATE 0.4 MG/ML IJ SOLN
0.4000 mg | Freq: Once | INTRAMUSCULAR | Status: DC
  Filled 2013-08-23: qty 1

## 2013-08-23 NOTE — Progress Notes (Signed)
Pt blood pressure slowly rising due to medications being discontinued due to low heart rate; paged Dr. Antoine Poche; returned paged; see new order; will continue to monitor

## 2013-08-23 NOTE — Progress Notes (Signed)
Echo Lab  2D Echocardiogram completed.  [name], RDCS 08/23/2013 9:12 AM

## 2013-08-23 NOTE — Progress Notes (Addendum)
Patient ID: Vista DeckRobert D Wojahn, male   DOB: 1940-09-09, 73 y.o.   MRN: [id number]    Subjective:  Denies SSCP, palpitations   Dyspnea improved  Looking for [employer] game on [employer]  Objective:  Filed Vitals:   08/23/13 0100 08/23/13 0335 08/23/13 0400 08/23/13 0827  BP:   106/47 149/47  Pulse: 34  32 34  Temp:  98.2 F (36.8 C)  98 F (36.7 C)  TempSrc:  Oral  Oral  Resp: 14  18   Height:      Weight:  185 lb 13.6 oz (84.3 kg)    SpO2: 95%  96% 97%    Intake/Output from previous day:  Intake/Output Summary (Last 24 hours) at 08/23/13 1118 Last data filed at 08/23/13 0900  Gross per 24 hour  Intake    360 ml  Output   2050 ml  Net  -1690 ml    Physical Exam: Affect appropriate Healthy:  appears stated age HEENT: normal Neck supple with no adenopathy JVP normal no bruits no thyromegaly Lungs decreased BS bases no wheezing and good diaphragmatic motion Heart:  S1/S2 promiant no AR   murmur, no rub, gallop or click PMI normal Abdomen: benighn, BS positve, no tenderness, no AAA no bruit.  No HSM or HJR Distal pulses intact with no bruits No edema Neuro non-focal Skin warm and dry No muscular weakness   Lab Results: Basic Metabolic Panel:  Recent Labs  40/03/2702/06/15 1045 08/23/13 0334  NA 142 140  K 3.9 5.6*  CL 100 102  CO2 26 23  GLUCOSE 129* 111*  BUN 28* 27*  CREATININE 0.99 1.12  CALCIUM 9.8 8.5   Liver Function Tests:  Recent Labs  08/22/13 1045 08/23/13 0334  AST 57* 63*  ALT 62* 44  ALKPHOS 118* 74  BILITOT 0.9 0.6  PROT 8.3 6.0  ALBUMIN 4.2 2.9*   No results found for this basename: LIPASE, AMYLASE,  in the last 72 hours CBC:  Recent Labs  08/22/13 0959  WBC 9.8  HGB 10.7*  HCT 34.6*  MCV 93.5  PLT 244   Cardiac Enzymes:  Recent Labs  08/22/13 1045  TROPONINI <0.30    Imaging: CT:  Ascending aortic repair in tact  Large disection with flap in descending thoracic aorta distal to subclavian   Cardiac Studies:  ECG:   SB  with PAC and junctional escape     Telemetry:   SB with junctional rates 35   Echo:   Medications:   . amiodarone  400 mg Oral Daily  . aspirin EC  81 mg Oral Daily  . atorvastatin  10 mg Oral Daily  . atropine      . diltiazem  240 mg Oral BID  . enalapril  5 mg Oral Daily  . furosemide  40 mg Intravenous BID  . heparin  5,000 Units Subcutaneous 3 times per day  . loratadine  10 mg Oral Daily  . metoprolol  100 mg Oral BID  . multivitamin with minerals  1 tablet Oral Daily  . pantoprazole  40 mg Oral Daily  . potassium chloride SA  40 mEq Oral Daily  . sodium chloride  3 mL Intravenous Q12H       Assessment/Plan:  Disection:  Reviewed CT  Plans for ? Stent repair with Dr Myra GianottiBrabham and Tyrone SageGerhardt latter in week.  Recent type one repair intact Bradycardia:  K slightly higher Cr ok  On 3 drugs ( amiodarone, lopressor and cardizem ) that need  to be stopped He is asymptomatic.  Will have atropine and external pacer at bedside. Troponin negative with no chest pain Limit to OOB to chair CHF:   Improved continue current dose of lasix  F/u CXR in am   Discussed care with nurses  Discussed possible need for Temp wire with wife and patient including risks  Has had lymphcele in right groin and carotid surgery on right neck   Charlton Haws 08/23/2013, 11:18 AM

## 2013-08-23 NOTE — Progress Notes (Signed)
[address]       [name] [name] [address]             [phone]     CARDIOTHORACIC SURGERY PROGRESS NOTE  Subjective: Feels a little better today.  No chest pain or back pain.  Objective: Vital signs in last 24 hours: Temp:  [98 F (36.7 C)-99.2 F (37.3 C)] 98.4 F (36.9 C) (03/07 1143) Pulse Rate:  [32-59] 39 (03/07 1143) Cardiac Rhythm:  [-] Sinus bradycardia (03/07 0827) Resp:  [12-20] 18 (03/07 0400) BP: (106-178)/(32-72) 135/32 mmHg (03/07 1143) SpO2:  [93 %-99 %] 99 % (03/07 1143) Weight:  [84 kg (185 lb 3 oz)-84.3 kg (185 lb 13.6 oz)] 84.3 kg (185 lb 13.6 oz) (03/07 0335)  Physical Exam:  Rhythm:   Sinus brady  Breath sounds: clear  Heart sounds:  RRR  Incisions:  n/a  Abdomen:  soft  Extremities:  warm   Intake/Output from previous day: 03/06 0701 - 03/07 0700 In: -  Out: 2050 [Urine:2050] Intake/Output this shift: Total I/O In: 600 [P.O.:600] Out: -   Lab Results:  Recent Labs  08/22/13 0959  WBC 9.8  HGB 10.7*  HCT 34.6*  PLT 244   BMET:  Recent Labs  08/22/13 1045 08/23/13 0334  NA 142 140  K 3.9 5.6*  CL 100 102  CO2 26 23  GLUCOSE 129* 111*  BUN 28* 27*  CREATININE 0.99 1.12  CALCIUM 9.8 8.5    CBG (last 3)  No results found for this basename: GLUCAP,  in the last 72 hours PT/INR:   Recent Labs  08/22/13 1045  LABPROT 18.5*  INR 1.59*    CXR:  N/A   Transthoracic Echocardiography  Patient: [name], [name] MR #: [id number] Study Date: 08/23/2013 Gender: M Age: 73 Height: 177.8cm Weight: 84.3kg BSA: 2.18m^2 Pt. Status: Room: 2H27C  [name] [name] REFERRING [name] [name] SONOGRAPHER [name] [name], RDCS ATTENDING [name] [name], [name] [name] PERFORMING [name] [name], [name] [name] cc:  ------------------------------------------------------------ LV EF: 50% - 55%  ------------------------------------------------------------ Indications: CHF -  428.0.  ------------------------------------------------------------ History: PMH: Atrial fibrillation. Atrial flutter. Coronary artery disease. PMH: Myocardial infarction. Risk factors: Current tobacco use. Hypertension.  ------------------------------------------------------------ Study Conclusions  - Left ventricle: The cavity size was normal. Wall thickness was increased in a pattern of mild LVH. Systolic function was normal. The estimated ejection fraction was in the range of 50% to 55%. Doppler parameters are consistent with a reversible restrictive pattern, indicative of decreased left ventricular diastolic compliance and/or increased left atrial pressure (grade 3 diastolic dysfunction). - Aortic valve: Normal appearing tissue bioprosthetic valve with normal systolic gradient and no AR - Mitral valve: Calcified annulus. Mild regurgitation. - Left atrium: The atrium was moderately dilated. - Right ventricle: The cavity size was moderately dilated. - Right atrium: The atrium was severely dilated. - Atrial septum: No defect or patent foramen ovale was identified. - Tricuspid valve: Moderate regurgitation. - Pulmonary arteries: PA peak pressure: 7mm Hg (S).  ------------------------------------------------------------ Labs, prior tests, procedures, and surgery: Valve surgery (2010). Aortic valve replacement.  Aortic repair (2010). Transthoracic echocardiography. M-mode, complete 2D, spectral Doppler, and color Doppler. Height: Height: 177.8cm. Height: 70in. Weight: Weight: 84.3kg. Weight: 185.5lb. Body mass index: BMI: 26.7kg/m^2. Body surface area: BSA: 2.32m^2. Blood pressure: 106/47. Patient status: [name] [name]. Location: ICU/CCU  ------------------------------------------------------------  ------------------------------------------------------------ Left ventricle: The cavity size was normal. Wall thickness was increased in a pattern of mild LVH. Systolic  function was normal. The estimated ejection fraction  was in the range of 50% to 55%. Doppler parameters are consistent with a reversible restrictive pattern, indicative of decreased left ventricular diastolic compliance and/or increased left atrial pressure (grade 3 diastolic dysfunction).  ------------------------------------------------------------ Aortic valve: Normal appearing tissue bioprosthetic valve with normal systolic gradient and no AR Doppler: Peak velocity ratio of LVOT to aortic valve: 0.48. Mean gradient: 9mm Hg (S). Peak gradient: 18mm Hg (S).  ------------------------------------------------------------ Mitral valve: Calcified annulus. Doppler: Mild regurgitation. Peak gradient: 5mm Hg (D).  ------------------------------------------------------------ Left atrium: The atrium was moderately dilated.  ------------------------------------------------------------ Atrial septum: No defect or patent foramen ovale was identified.  ------------------------------------------------------------ Right ventricle: The cavity size was moderately dilated.  ------------------------------------------------------------ Pulmonic valve: Doppler: Mild regurgitation.  ------------------------------------------------------------ Tricuspid valve: Doppler: Moderate regurgitation.  ------------------------------------------------------------ Right atrium: The atrium was severely dilated.  ------------------------------------------------------------ Pericardium: The pericardium was normal in appearance.  ------------------------------------------------------------  2D measurements Normal Doppler Normal Left ventricle measurements LVID ED, 58.7 mm 43-52 Main pulmonary chord, artery PLAX Pressure, S 51 mm =30 LVID ES, 43.7 mm 23-38 Hg chord, Left ventricle PLAX Ea, med 4.79 cm/ ------- FS, chord, 26 % >29 ann, tiss s PLAX DP LVPW, ED 11.5 mm ------ E/Ea, med 22.76  ------- IVS/LVPW 1.03 <1.3 ann, tiss ratio, ED DP Ventricular septum LVOT IVS, ED 11.9 mm ------ Peak vel, S 103 cm/ ------- Aorta s Root diam, 34 mm ------ Aortic valve ED Peak vel, S 215 cm/ ------- Left atrium s AP dim 54 mm ------ Mean vel, S 130 cm/ ------- AP dim 2.63 cm/m^2 <2.2 s index VTI, S 49.8 cm ------- Mean 9 mm ------- gradient, S Hg Peak 18 mm ------- gradient, S Hg Peak vel 0.48 ------- ratio, LVOT/AV Mitral valve Peak E vel 109 cm/ ------- s Deceleratio 142 ms 150-230 n time Peak 5 mm ------- gradient, D Hg Tricuspid valve Regurg peak 308 cm/ ------- vel s Peak RV-RA 38 mm ------- gradient, S Hg Max regurg 308 cm/ ------- vel s Systemic veins Estimated 13 mm ------- CVP Hg Right ventricle Pressure, S 51 mm <30 Hg Sa vel, lat 9.68 cm/ ------- ann, tiss s DP  ------------------------------------------------------------ Prepared and Electronically Authenticated by  [name] [name], [name] [name] 2015-03-07T10:08:41.030        Assessment/Plan:  Clinically stable. Plan per Cardiology Carotid-subclavian bypass this week  [name] [name] 08/23/2013 2:47 PM

## 2013-08-23 NOTE — Progress Notes (Signed)
Patient is bradycardic with a heart rate of low 30s and sometimes upper 20s. He is alert and oriented  and does not endorse chest pain. Of note, Patient received his home regimen of 100 metoprolol, 400mg  amiodarone, 240mg  diltiazem XR and enalapril.  Md made aware. Atropine brought to bedside. Will continue to monitor

## 2013-08-24 ENCOUNTER — Inpatient Hospital Stay (HOSPITAL_COMMUNITY): Payer: Medicare Other

## 2013-08-24 LAB — BASIC METABOLIC PANEL
BUN: 21 mg/dL (ref 6–23)
CHLORIDE: 102 meq/L (ref 96–112)
CO2: 26 mEq/L (ref 19–32)
CREATININE: 0.96 mg/dL (ref 0.50–1.35)
Calcium: 9 mg/dL (ref 8.4–10.5)
GFR calc Af Amer: >90 mL/min (ref >90)
GFR calc non Af Amer: 80 mL/min — ABNORMAL LOW (ref >90)
Glucose, Bld: 102 mg/dL — ABNORMAL HIGH (ref 70–99)
Potassium: 3.5 mEq/L — ABNORMAL LOW (ref 3.7–5.3)
Sodium: 145 mEq/L (ref 137–147)

## 2013-08-24 MED ORDER — AMIODARONE HCL 200 MG PO TABS
400.0000 mg | ORAL_TABLET | Freq: Two times a day (BID) | ORAL | Status: DC
  Administered 2013-08-24 – 2013-08-25 (×3): 400 mg via ORAL
  Filled 2013-08-24 (×4): qty 2

## 2013-08-24 MED ORDER — ENALAPRIL MALEATE 10 MG PO TABS
10.0000 mg | ORAL_TABLET | Freq: Every day | ORAL | Status: DC
  Administered 2013-08-24 – 2013-08-29 (×5): 10 mg via ORAL
  Filled 2013-08-24 (×6): qty 1

## 2013-08-24 MED ORDER — ENALAPRIL MALEATE 10 MG PO TABS: 10.0000 mg | ORAL_TABLET | Freq: Every day | ORAL | Status: DC

## 2013-08-24 MED ORDER — HYDRALAZINE HCL 25 MG PO TABS
25.0000 mg | ORAL_TABLET | Freq: Four times a day (QID) | ORAL | Status: DC
  Administered 2013-08-24 – 2013-08-28 (×11): 25 mg via ORAL
  Filled 2013-08-24 (×24): qty 1

## 2013-08-24 NOTE — Progress Notes (Signed)
Utilization Review Completed.[name] [name] T3/01/2014  

## 2013-08-24 NOTE — Progress Notes (Addendum)
Patient ID: [name] [name], male   DOB: [birth year], 73 y.o.   MRN: [id number]    Subjective:  Good day yesterday  His team [facility] won.    Objective:  Filed Vitals:   08/24/13 0400 08/24/13 0500 08/24/13 0600 08/24/13 0800  BP: 183/52  185/57 200/50  Pulse: 67  65   Temp: 98 F (36.7 C)   98 F (36.7 C)  TempSrc: Oral   Oral  Resp:    20  Height:      Weight:  180 lb 11.2 oz (81.965 kg)    SpO2: 97%  94%     Intake/Output from previous day:  Intake/Output Summary (Last 24 hours) at 08/24/13 0951 Last data filed at 08/24/13 0900  Gross per 24 hour  Intake   1080 ml  Output   3125 ml  Net  -2045 ml    Physical Exam: Affect appropriate Healthy:  appears stated age HEENT: normal Neck supple with no adenopathy JVP normal no bruits no thyromegaly Lungs decreased BS bases no wheezing and good diaphragmatic motion Heart:  S1/S2 promiant no AR   murmur, no rub, gallop or click PMI normal Abdomen: benighn, BS positve, no tenderness, no AAA no bruit.  No HSM or HJR Distal pulses intact with no bruits No edema Neuro non-focal Skin warm and dry No muscular weakness   Lab Results: Basic Metabolic Panel:  Recent Labs  83/41/96 0334 08/24/13 0355  NA 140 145  K 5.6* 3.5*  CL 102 102  CO2 23 26  GLUCOSE 111* 102*  BUN 27* 21  CREATININE 1.12 0.96  CALCIUM 8.5 9.0   Liver Function Tests:  Recent Labs  08/22/13 1045 08/23/13 0334  AST 57* 63*  ALT 62* 44  ALKPHOS 118* 74  BILITOT 0.9 0.6  PROT 8.3 6.0  ALBUMIN 4.2 2.9*   No results found for this basename: LIPASE, AMYLASE,  in the last 72 hours CBC:  Recent Labs  08/22/13 0959  WBC 9.8  HGB 10.7*  HCT 34.6*  MCV 93.5  PLT 244   Cardiac Enzymes:  Recent Labs  08/22/13 1045  TROPONINI <0.30    Imaging: CT:  Ascending aortic repair in tact  Large disection with flap in descending thoracic aorta distal to subclavian   Cardiac Studies:  ECG:   SB with PAC and junctional escape      Telemetry:   SB with junctional rates 35  3/7  Resolved now SR rate mid 60's no junctional   Echo:   Medications:   . aspirin EC  81 mg Oral Daily  . atorvastatin  10 mg Oral Daily  . atropine  0.4 mg Intravenous Once  . enalapril  5 mg Oral Daily  . furosemide  40 mg Intravenous BID  . heparin  5,000 Units Subcutaneous 3 times per day  . loratadine  10 mg Oral Daily  . multivitamin with minerals  1 tablet Oral Daily  . pantoprazole  40 mg Oral Daily  . sodium chloride  3 mL Intravenous Q12H       Assessment/Plan:  Disection:  Reviewed CT  Plans for ? Stent repair with Dr Myra Gianotti and Tyrone Sage latter in week.  Recent type one repair intact Bradycardia:  Resolved will resume amiodarone as he is at risk for periop PAF  Eliquis held for surgery on DVT SQ heparin HTN:  Add hydralazine q 6 and increase ACe  Surgery planned for Tuesday   Charlton Haws 08/24/2013, 9:51 AM

## 2013-08-25 DIAGNOSIS — E876 Hypokalemia: Secondary | ICD-10-CM

## 2013-08-25 DIAGNOSIS — I700 Atherosclerosis of aorta: Secondary | ICD-10-CM

## 2013-08-25 DIAGNOSIS — Z954 Presence of other heart-valve replacement: Secondary | ICD-10-CM

## 2013-08-25 DIAGNOSIS — I4891 Unspecified atrial fibrillation: Secondary | ICD-10-CM

## 2013-08-25 DIAGNOSIS — I10 Essential (primary) hypertension: Secondary | ICD-10-CM

## 2013-08-25 DIAGNOSIS — E785 Hyperlipidemia, unspecified: Secondary | ICD-10-CM

## 2013-08-25 DIAGNOSIS — I2510 Atherosclerotic heart disease of native coronary artery without angina pectoris: Secondary | ICD-10-CM

## 2013-08-25 LAB — POCT I-STAT 3, ART BLOOD GAS (G3+)
ACID-BASE EXCESS: 6 mmol/L — AB (ref 0.0–2.0)
BICARBONATE: 27.7 meq/L — AB (ref 20.0–24.0)
O2 Saturation: 99 %
Patient temperature: 98.6
TCO2: 29 mmol/L (ref 0–100)
pCO2 arterial: 29.7 mmHg — ABNORMAL LOW (ref 35.0–45.0)
pH, Arterial: 7.578 — ABNORMAL HIGH (ref 7.350–7.450)
pO2, Arterial: 115 mmHg — ABNORMAL HIGH (ref 80.0–100.0)

## 2013-08-25 LAB — SURGICAL PCR SCREEN
MRSA, PCR: NEGATIVE
Staphylococcus aureus: NEGATIVE

## 2013-08-25 LAB — BASIC METABOLIC PANEL
BUN: 20 mg/dL (ref 6–23)
CALCIUM: 8.6 mg/dL (ref 8.4–10.5)
CO2: 25 meq/L (ref 19–32)
CREATININE: 0.95 mg/dL (ref 0.50–1.35)
Chloride: 104 mEq/L (ref 96–112)
GFR calc Af Amer: >90 mL/min (ref >90)
GFR calc non Af Amer: 81 mL/min — ABNORMAL LOW (ref >90)
Glucose, Bld: 92 mg/dL (ref 70–99)
Potassium: 3.4 mEq/L — ABNORMAL LOW (ref 3.7–5.3)
Sodium: 143 mEq/L (ref 137–147)

## 2013-08-25 LAB — TYPE AND SCREEN
ABO/RH(D): A POS
Antibody Screen: NEGATIVE

## 2013-08-25 MED ORDER — SODIUM CHLORIDE 0.9 % IV SOLN
INTRAVENOUS | Status: DC
  Filled 2013-08-25: qty 40

## 2013-08-25 MED ORDER — EPINEPHRINE HCL 1 MG/ML IJ SOLN
0.5000 ug/min | INTRAVENOUS | Status: DC
  Filled 2013-08-25 (×2): qty 4

## 2013-08-25 MED ORDER — CHLORHEXIDINE GLUCONATE CLOTH 2 % EX PADS: 6.0000 | MEDICATED_PAD | Freq: Once | CUTANEOUS | Status: AC

## 2013-08-25 MED ORDER — CHLORHEXIDINE GLUCONATE CLOTH 2 % EX PADS
6.0000 | MEDICATED_PAD | Freq: Once | CUTANEOUS | Status: AC
  Administered 2013-08-26: 6 via TOPICAL

## 2013-08-25 MED ORDER — ZOLPIDEM TARTRATE 5 MG PO TABS: 5.0000 mg | ORAL_TABLET | Freq: Every evening | ORAL | Status: DC | PRN

## 2013-08-25 MED ORDER — BISACODYL 5 MG PO TBEC
5.0000 mg | DELAYED_RELEASE_TABLET | Freq: Once | ORAL | Status: AC
  Administered 2013-08-25: 5 mg via ORAL
  Filled 2013-08-25: qty 1

## 2013-08-25 MED ORDER — DOPAMINE-DEXTROSE 3.2-5 MG/ML-% IV SOLN
2.0000 ug/kg/min | INTRAVENOUS | Status: DC
  Filled 2013-08-25: qty 250

## 2013-08-25 MED ORDER — DEXTROSE 5 % IV SOLN
1.5000 g | INTRAVENOUS | Status: AC
  Administered 2013-08-26 (×2): 1.5 g via INTRAVENOUS
  Filled 2013-08-25: qty 1.5

## 2013-08-25 MED ORDER — METOPROLOL TARTRATE 12.5 MG HALF TABLET
12.5000 mg | ORAL_TABLET | Freq: Once | ORAL | Status: AC
  Administered 2013-08-26: 12.5 mg via ORAL
  Filled 2013-08-25: qty 1

## 2013-08-25 MED ORDER — SODIUM CHLORIDE 0.9 % IV SOLN
INTRAVENOUS | Status: DC
  Filled 2013-08-25: qty 1

## 2013-08-25 MED ORDER — PHENYLEPHRINE HCL 10 MG/ML IJ SOLN
30.0000 ug/min | INTRAVENOUS | Status: DC
  Filled 2013-08-25: qty 2

## 2013-08-25 MED ORDER — AMIODARONE HCL 200 MG PO TABS
400.0000 mg | ORAL_TABLET | Freq: Every day | ORAL | Status: DC
  Administered 2013-08-27 – 2013-08-29 (×3): 400 mg via ORAL
  Filled 2013-08-25 (×4): qty 2

## 2013-08-25 MED ORDER — METOPROLOL TARTRATE 25 MG PO TABS
25.0000 mg | ORAL_TABLET | Freq: Two times a day (BID) | ORAL | Status: DC
  Administered 2013-08-25 – 2013-08-29 (×6): 25 mg via ORAL
  Filled 2013-08-25 (×10): qty 1

## 2013-08-25 MED ORDER — SODIUM CHLORIDE 0.9 % IV SOLN
INTRAVENOUS | Status: DC
  Filled 2013-08-25: qty 30

## 2013-08-25 MED ORDER — NITROGLYCERIN IN D5W 200-5 MCG/ML-% IV SOLN
2.0000 ug/min | INTRAVENOUS | Status: DC
  Filled 2013-08-25: qty 250

## 2013-08-25 MED ORDER — POTASSIUM CHLORIDE 2 MEQ/ML IV SOLN
80.0000 meq | INTRAVENOUS | Status: DC
  Filled 2013-08-25: qty 40

## 2013-08-25 MED ORDER — DEXMEDETOMIDINE HCL IN NACL 400 MCG/100ML IV SOLN
0.1000 ug/kg/h | INTRAVENOUS | Status: DC
  Filled 2013-08-25: qty 100

## 2013-08-25 MED ORDER — DEXTROSE 5 % IV SOLN
750.0000 mg | INTRAVENOUS | Status: DC
  Filled 2013-08-25: qty 750

## 2013-08-25 MED ORDER — POTASSIUM CHLORIDE CRYS ER 20 MEQ PO TBCR
40.0000 meq | EXTENDED_RELEASE_TABLET | Freq: Every day | ORAL | Status: DC
  Administered 2013-08-25 – 2013-08-29 (×4): 40 meq via ORAL
  Filled 2013-08-25 (×5): qty 2

## 2013-08-25 MED ORDER — PLASMA-LYTE 148 IV SOLN
INTRAVENOUS | Status: DC
Start: 2013-08-26 — End: 2013-08-26
  Filled 2013-08-25: qty 2.5

## 2013-08-25 MED ORDER — MAGNESIUM SULFATE 50 % IJ SOLN
40.0000 meq | INTRAMUSCULAR | Status: DC
  Filled 2013-08-25: qty 10

## 2013-08-25 MED ORDER — CHLORHEXIDINE GLUCONATE 4 % EX LIQD
CUTANEOUS | Status: AC
  Administered 2013-08-25: 20:00:00
  Filled 2013-08-25: qty 15

## 2013-08-25 MED ORDER — TEMAZEPAM 15 MG PO CAPS
15.0000 mg | ORAL_CAPSULE | Freq: Once | ORAL | Status: AC | PRN
  Administered 2013-08-25: 15 mg via ORAL
  Filled 2013-08-25: qty 1

## 2013-08-25 MED ORDER — VANCOMYCIN HCL 10 G IV SOLR
1250.0000 mg | INTRAVENOUS | Status: DC
  Filled 2013-08-25: qty 1250

## 2013-08-25 NOTE — Care Management Note (Addendum)
    Page 1 of 2   08/28/2013     11:01:21 AM   CARE MANAGEMENT NOTE 08/28/2013  Patient:  [name] [name], [name] [name]   Account Number:  [id number]  Date Initiated:  08/25/2013  Documentation initiated by:  Junius Creamer  Subjective/Objective Assessment:   adm w heart failure     Action/Plan:   lives w wife, pcp dr Eldridge Dace   Anticipated DC Date:  09/01/2013   Anticipated DC Plan:  HOME W HOME HEALTH SERVICES      DC Planning Services  CM consult      Choice offered to / List presented to:             Status of service:  In process, will continue to follow Medicare Important Message given?   (If response is "NO", the following Medicare IM given date fields will be blank) Date Medicare IM given:   Date Additional Medicare IM given:    Discharge Disposition:    Per UR Regulation:  Reviewed for med. necessity/level of care/duration of stay  If discussed at Long Length of Stay Meetings, dates discussed:   08/28/2013    Comments:  Contact:  Ewald, Ducker Spouse [phone]                 [facility] Daughter [phone]  08-28-13 10:55am Avie Arenas, RNBSN [phone] Patient more awake - looks good.  Wife at bedside.  In the past have had HH with [facility].  Has RW, [place] and bedside commode at home.  CM will continue to follow for progression - progressing to 2W  unit today.   08-27-13 11:15am Avie Arenas, RNBSN [phone] Post op BYPASS GRAFT CAROTID-SUBCLAVIAN (Left) and THORACIC STENT GRAFT INSERTION on 3-10.  CM will continue to follow for progression.   3/9 1043a debbie dowell rn,bsn pt for car and subcl bypass this week. will moniter for [facility]hc needs as pt progresses.

## 2013-08-25 NOTE — Progress Notes (Signed)
Patient ID: [name] [name], male   DOB: 25-Feb-1941, 73 y.o.   MRN: [id number]      [address]       [place] [address]             [id number]                   Procedure(s) (LRB): BYPASS GRAFT CAROTID-SUBCLAVIAN (Left)  THORACIC STENT GRAFT INSERTION (N/A)  LOS: 3 days   Subjective: Feels much better today, sob much improved  Objective: Vital signs in last 24 hours: Patient Vitals for the past 24 hrs:  BP Temp Temp src Pulse Resp SpO2 Weight  08/25/13 0735 170/56 mmHg 98.1 F (36.7 C) Oral - - 96 % -  08/25/13 0325 151/55 mmHg 98.3 F (36.8 C) Oral - - - 178 lb 12.7 oz (81.1 kg)  08/25/13 0027 163/65 mmHg - - - - - -  08/24/13 2300 - 98.7 F (37.1 C) Oral - - - -  08/24/13 1950 135/45 mmHg - - 80 - - -  08/24/13 1946 - 98.3 F (36.8 C) Oral - - 95 % -  08/24/13 1551 161/51 mmHg 97.9 F (36.6 C) Oral 68 18 97 % -  08/24/13 1142 176/53 mmHg 98.8 F (37.1 C) Oral - 18 98 % -    Filed Weights   08/23/13 0335 08/24/13 0500 08/25/13 0325  Weight: 185 lb 13.6 oz (84.3 kg) 180 lb 11.2 oz (81.965 kg) 178 lb 12.7 oz (81.1 kg)    Hemodynamic parameters for last 24 hours:    Intake/Output from previous day: 03/08 0701 - 03/09 0700 In: 1200 [P.O.:1200] Out: 1470 [Urine:1470] Intake/Output this shift:    Scheduled Meds: . [START ON 08/26/2013] amiodarone  400 mg Oral Daily  . aspirin EC  81 mg Oral Daily  . atorvastatin  10 mg Oral Daily  . atropine  0.4 mg Intravenous Once  . enalapril  10 mg Oral Daily  . furosemide  40 mg Intravenous BID  . heparin  5,000 Units Subcutaneous 3 times per day  . hydrALAZINE  25 mg Oral 4 times per day  . loratadine  10 mg Oral Daily  . metoprolol tartrate  25 mg Oral BID  . multivitamin with minerals  1 tablet Oral Daily  . pantoprazole  40 mg Oral Daily  . potassium chloride  40 mEq Oral Daily  . sodium chloride  3 mL Intravenous Q12H   Continuous Infusions:  PRN Meds:.sodium chloride, nitroGLYCERIN, sodium  chloride, zolpidem  General appearance: alert and cooperative Neurologic: intact Heart: regular rate and rhythm, S1, S2 normal, no murmur, click, rub or gallop Lungs: clear to auscultation bilaterally Abdomen: soft, non-tender; bowel sounds normal; no masses,  no organomegaly Extremities: edema less edema at ankles  Lab Results: CBC:No results found for this basename: WBC, HGB, HCT, PLT,  in the last 72 hours BMET:  Recent Labs  08/24/13 0355 08/25/13 0433  NA 145 143  K 3.5* 3.4*  CL 102 104  CO2 26 25  GLUCOSE 102* 92  BUN 21 20  CREATININE 0.96 0.95  CALCIUM 9.0 8.6    PT/INR: No results found for this basename: LABPROT, INR,  in the last 72 hours   Radiology Dg Chest Port 1 View  08/24/2013   CLINICAL DATA:  CHF and 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  EXAM: PORTABLE CHEST - 1 VIEW  COMPARISON:  CT ANGIO CHEST W/CM &/OR WO/CM dated 08/22/2013;  DG CHEST 2 VIEW dated 08/22/2013; CT ANGIO CHEST AORTA W/CM & WO/CM dated 05/04/2013; DG CHEST 1V PORT dated 05/12/2013  FINDINGS: Stable prominence at aortic contour along the left mediastinum corresponding to the proximal descending thoracic aorta. There is stable cardiomegaly. CHF appears improved since the prior chest x-ray with no significant residual edema. Pleural effusions have resolved.  IMPRESSION: Resolution of CHF and pleural effusions. 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   Electronically Signed   By: Irish LackGlenn  Yamagata M.D.   On: 08/24/2013 08:34     Assessment/Plan: S/P Procedure(s) (LRB): BYPASS GRAFT CAROTID-SUBCLAVIAN (Left)  THORACIC STENT GRAFT INSERTION (N/A)  The goals risks and alternatives of the planned surgical procedure Thoracic stent graft / left subclavian bypass  have been discussed with the patient in detail. The risks of the procedure including death, infection, stroke, myocardial infarction,  bleeding, blood transfusion. have all been discussed specifically. Patienty and family have been informed of risk of spinal cord ischemia and paraplegia with the planned procedure. I have quoted Vista Deckobert D Kingbird a 5% of perioperative mortality and a complication rate as high as 25 %. The patient's questions have been answered.Vista Deckobert D Bettendorf is willing  to proceed with the planned procedure.  Delight OvensEdward B Maleaha Hughett MD 08/25/2013 11:40 AM

## 2013-08-25 NOTE — Progress Notes (Signed)
SUBJECTIVE:  No complaints except LE edema  OBJECTIVE:   Vitals:   Filed Vitals:   08/24/13 2300 08/25/13 0027 08/25/13 0325 08/25/13 0735  BP:  163/65 151/55 170/56  Pulse:      Temp: 98.7 F (37.1 C)  98.3 F (36.8 C) 98.1 F (36.7 C)  TempSrc: Oral  Oral Oral  Resp:      Height:      Weight:   178 lb 12.7 oz (81.1 kg)   SpO2:    96%   I&O's:   Intake/Output Summary (Last 24 hours) at 08/25/13 1057 Last data filed at 08/25/13 0300  Gross per 24 hour  Intake    840 ml  Output   1470 ml  Net   -630 ml   TELEMETRY: Reviewed telemetry pt in NSR:     PHYSICAL EXAM General: Well developed, well nourished, in no acute distress Head: Eyes PERRLA, No xanthomas.   Normal cephalic and atramatic  Lungs:   Clear bilaterally to auscultation and percussion. Heart:   HRRR S1 S2 Pulses are 2+ & equal. Abdomen: Bowel sounds are positive, abdomen soft and non-tender without masses  Extremities:   2+ edema Neuro: Alert and oriented X 3. Psych:  Good affect, responds appropriately   LABS: Basic Metabolic Panel:  Recent Labs  40/98/11 0355 08/25/13 0433  NA 145 143  K 3.5* 3.4*  CL 102 104  CO2 26 25  GLUCOSE 102* 92  BUN 21 20  CREATININE 0.96 0.95  CALCIUM 9.0 8.6   Liver Function Tests:  Recent Labs  08/23/13 0334  AST 63*  ALT 44  ALKPHOS 74  BILITOT 0.6  PROT 6.0  ALBUMIN 2.9*   No results found for this basename: LIPASE, AMYLASE,  in the last 72 hours CBC: No results found for this basename: WBC, NEUTROABS, HGB, HCT, MCV, PLT,  in the last 72 hours Cardiac Enzymes: No results found for this basename: CKTOTAL, CKMB, CKMBINDEX, TROPONINI,  in the last 72 hours BNP: No components found with this basename: POCBNP,  D-Dimer: No results found for this basename: DDIMER,  in the last 72 hours Hemoglobin A1C: No results found for this basename: HGBA1C,  in the last 72 hours Fasting Lipid Panel: No results found for this basename: CHOL, HDL, LDLCALC, TRIG,  CHOLHDL, LDLDIRECT,  in the last 72 hours Thyroid Function Tests: No results found for this basename: TSH, T4TOTAL, FREET3, T3FREE, THYROIDAB,  in the last 72 hours Anemia Panel: No results found for this basename: VITAMINB12, FOLATE, FERRITIN, TIBC, IRON, RETICCTPCT,  in the last 72 hours Coag Panel:   Lab Results  Component Value Date   INR 1.59* 08/22/2013   INR 1.15 05/04/2013   INR 1.03 07/28/2010    RADIOLOGY: Ct Angio Head W/cm &/or Wo Cm  08/22/2013   CLINICAL DATA:  73 year old male with history of thoracic aneurysm status post repair. Progressive weight gain, swelling and shortness of Breath. Recently thought to have sinus infection, treated with antibiotics and steroids. Symptoms increasing. Initial encounter.  EXAM: CT ANGIOGRAPHY HEAD AND NECK  TECHNIQUE: Multidetector CT imaging of the head and neck was performed using the standard protocol during bolus administration of intravenous contrast. Multiplanar CT image reconstructions and MIPs were obtained to evaluate the vascular anatomy. Carotid stenosis measurements (when applicable) are obtained utilizing NASCET criteria, using the distal internal carotid diameter as the denominator.  CONTRAST:  50mL OMNIPAQUE IOHEXOL 350 MG/ML SOLN  COMPARISON:  Chest abdomen and pelvis CTA from the same  day reported separately.  FINDINGS: CTA HEAD FINDINGS  Calvarium intact. Negative scalp soft tissues. Cerebral volume is within normal limits for age. No midline shift, ventriculomegaly, mass effect, evidence of mass lesion, intracranial hemorrhage or evidence of cortically based acute infarction. Gray-white matter differentiation is within normal limits throughout the brain. No abnormal enhancement identified.  VASCULAR FINDINGS:  Major intracranial venous structures are enhancing.  Dominant distal left vertebral artery is patent without stenosis despite some calcified plaque. Retrograde supply to the non dominant distal right vertebral artery. Right PICA and  AICA are patent. Dominant left AICA. The basilar artery is patent without stenosis. SCA and PCA origins are within normal limits. Normal left posterior communicating artery, the right is diminutive or absent. Bilateral PCA branches are within normal limits.  Some cavernous sinus venous contrast artifact is present. Both ICA siphons are patent. Ophthalmic and posterior communicating arteries are within normal limits. Normal carotid termini. Normal MCA and ACA origins. Anterior communicating artery and bilateral ACA branches are within normal limits. Bilateral MCA branches are within normal limits.  Review of the MIP images confirms the above findings.  CTA NECK FINDINGS  Chest findings are reported separately (please see that report). Negative thyroid, larynx, pharynx, parapharyngeal spaces, retropharyngeal space. Extensive dental hardware artifact. Negative visualized sublingual space, submandibular glands and submandibular space. Negative parotid glands. No acute orbits soft tissue findings.  No cervical lymphadenopathy. Mucous retention cyst left maxillary sinus. Other Visualized paranasal sinuses and mastoids are clear. No acute osseous abnormality identified. Degenerative changes in the cervical spine.  VASCULAR FINDINGS:  SEE CHEST CTA FINDINGS FROM TODAY REPORTED SEPARATELY.  Both internal jugular veins are patent. The left subclavian and innominate vein are patent. Negative non contrast appearance of the right subclavian. The right innominate vein is patent.  Great vessel origins are patent, dolichoectatic. Widespread mild soft and calcified circumferential plaque in the great vessels.  Patent, mildly dolichoectatic right CCA. Patent right carotid bifurcation with no right ICA stenosis despite calcified plaque. Tortuous, mildly dolichoectatic cervical right ICA.  No proximal right subclavian artery stenosis, with some dolichoectasia. Non dominant right vertebral artery is occluded at its origin. Distal right  vertebral artery flow is reconstituted briefly at the C2-C3 level, but then re- occludes below the skullbase. Intracranial findings detailed above.  Patent, the dolichoectatic left CCA. Calcified plaque at the left carotid bifurcation which is significantly affected by dental streak artifact. Loss of enhancement in the left ICA origin and bulb felt to be artifactual (see series 12, image 172). Tortuous/dolichoectatic but otherwise negative cervical left ICA be on that level.  No proximal left subclavian artery stenosis. Dominant left vertebral artery origin widely patent. Tortuous proximal left vertebral artery. Dominant left vertebral artery tortuous and patent to the skullbase.  Review of the MIP images confirms the above findings.  IMPRESSION: 1. Abnormal thoracic aorta, see chest CTA from today reported separately. 2. Patent, dolichoectatic great vessels without arterial dissection in the neck. 3. Occluded non dominant right vertebral artery. Patent, dominant left vertebral artery. 4. Suspect artifactual decreased enhancement of the left ICA origin, related to severe dental hardware artifact. Absence of any proximal left ICA stenosis could be confirmed with either carotid Doppler or neck MRA if necessary. 5. Distal right vertebral artery supplied in a retrograde fashion from the left. Intracranial atherosclerosis, but no intracranial stenosis and otherwise negative intracranial CTA. 6.  No acute intracranial abnormality.   Electronically Signed   By: Augusto GambleLee  Hall M.D.   On: 08/22/2013 12:26  Dg Chest 2 View  08/22/2013   CLINICAL DATA:  Shortness of breath.  History of aortic dissection.  EXAM: CHEST  2 VIEW  COMPARISON:  CT chest 05/04/2013. Single view of the chest 05/12/2013 and 11 16/2014. PA and lateral chest 05/09/2013.  FINDINGS: There is cardiomegaly and pulmonary edema with small bilateral pleural effusions. The patient is status post aortic valve replacement. Marked dilatation of the aortic arch  appears increased since the prior study.  IMPRESSION: Marked increase in size of the aortic arch since the prior exam worrisome for worsened dissection/aneurysm and possibly hemorrhage. The patient in congestive failure with marked cardiomegaly. CT angiogram of the aorta is recommended.  Critical Value/emergent results were called by telephone at the time of interpretation on 08/22/2013 at 10:26 AM to Dr. Karma Ganja, who verbally acknowledged these results.   Electronically Signed   By: Drusilla Kanner M.D.   On: 08/22/2013 10:28   Ct Angio Neck W/cm &/or Wo/cm  08/22/2013   CLINICAL DATA:  73 year old male with history of thoracic aneurysm status post repair. Progressive weight gain, swelling and shortness of Breath. Recently thought to have sinus infection, treated with antibiotics and steroids. Symptoms increasing. Initial encounter.  EXAM: CT ANGIOGRAPHY HEAD AND NECK  TECHNIQUE: Multidetector CT imaging of the head and neck was performed using the standard protocol during bolus administration of intravenous contrast. Multiplanar CT image reconstructions and MIPs were obtained to evaluate the vascular anatomy. Carotid stenosis measurements (when applicable) are obtained utilizing NASCET criteria, using the distal internal carotid diameter as the denominator.  CONTRAST:  50mL OMNIPAQUE IOHEXOL 350 MG/ML SOLN  COMPARISON:  Chest abdomen and pelvis CTA from the same day reported separately.  FINDINGS: CTA HEAD FINDINGS  Calvarium intact. Negative scalp soft tissues. Cerebral volume is within normal limits for age. No midline shift, ventriculomegaly, mass effect, evidence of mass lesion, intracranial hemorrhage or evidence of cortically based acute infarction. Gray-white matter differentiation is within normal limits throughout the brain. No abnormal enhancement identified.  VASCULAR FINDINGS:  Major intracranial venous structures are enhancing.  Dominant distal left vertebral artery is patent without stenosis despite  some calcified plaque. Retrograde supply to the non dominant distal right vertebral artery. Right PICA and AICA are patent. Dominant left AICA. The basilar artery is patent without stenosis. SCA and PCA origins are within normal limits. Normal left posterior communicating artery, the right is diminutive or absent. Bilateral PCA branches are within normal limits.  Some cavernous sinus venous contrast artifact is present. Both ICA siphons are patent. Ophthalmic and posterior communicating arteries are within normal limits. Normal carotid termini. Normal MCA and ACA origins. Anterior communicating artery and bilateral ACA branches are within normal limits. Bilateral MCA branches are within normal limits.  Review of the MIP images confirms the above findings.  CTA NECK FINDINGS  Chest findings are reported separately (please see that report). Negative thyroid, larynx, pharynx, parapharyngeal spaces, retropharyngeal space. Extensive dental hardware artifact. Negative visualized sublingual space, submandibular glands and submandibular space. Negative parotid glands. No acute orbits soft tissue findings.  No cervical lymphadenopathy. Mucous retention cyst left maxillary sinus. Other Visualized paranasal sinuses and mastoids are clear. No acute osseous abnormality identified. Degenerative changes in the cervical spine.  VASCULAR FINDINGS:  SEE CHEST CTA FINDINGS FROM TODAY REPORTED SEPARATELY.  Both internal jugular veins are patent. The left subclavian and innominate vein are patent. Negative non contrast appearance of the right subclavian. The right innominate vein is patent.  Great vessel origins are patent, dolichoectatic. Widespread  mild soft and calcified circumferential plaque in the great vessels.  Patent, mildly dolichoectatic right CCA. Patent right carotid bifurcation with no right ICA stenosis despite calcified plaque. Tortuous, mildly dolichoectatic cervical right ICA.  No proximal right subclavian artery  stenosis, with some dolichoectasia. Non dominant right vertebral artery is occluded at its origin. Distal right vertebral artery flow is reconstituted briefly at the C2-C3 level, but then re- occludes below the skullbase. Intracranial findings detailed above.  Patent, the dolichoectatic left CCA. Calcified plaque at the left carotid bifurcation which is significantly affected by dental streak artifact. Loss of enhancement in the left ICA origin and bulb felt to be artifactual (see series 12, image 172). Tortuous/dolichoectatic but otherwise negative cervical left ICA be on that level.  No proximal left subclavian artery stenosis. Dominant left vertebral artery origin widely patent. Tortuous proximal left vertebral artery. Dominant left vertebral artery tortuous and patent to the skullbase.  Review of the MIP images confirms the above findings.  IMPRESSION: 1. Abnormal thoracic aorta, see chest CTA from today reported separately. 2. Patent, dolichoectatic great vessels without arterial dissection in the neck. 3. Occluded non dominant right vertebral artery. Patent, dominant left vertebral artery. 4. Suspect artifactual decreased enhancement of the left ICA origin, related to severe dental hardware artifact. Absence of any proximal left ICA stenosis could be confirmed with either carotid Doppler or neck MRA if necessary. 5. Distal right vertebral artery supplied in a retrograde fashion from the left. Intracranial atherosclerosis, but no intracranial stenosis and otherwise negative intracranial CTA. 6.  No acute intracranial abnormality.   Electronically Signed   By: Augusto Gamble M.D.   On: 08/22/2013 12:26   Ct Angio Chest W/cm &/or Wo Cm  08/22/2013   CLINICAL DATA:  Short of breath. Weight gain. Swelling. Abnormal chest radiograph. Known aortic dissection with change in the appearance of the mediastinum compared to prior.  EXAM: CT ANGIOGRAPHY CHEST, ABDOMEN AND PELVIS  TECHNIQUE: Multidetector CT imaging through the  chest, abdomen and pelvis was performed using the standard protocol during bolus administration of intravenous contrast. Multiplanar reconstructed images and MIPs were obtained and reviewed to evaluate the vascular anatomy.  CONTRAST:  67mL OMNIPAQUE IOHEXOL 350 MG/ML SOLN  COMPARISON:  [place] THORA/PARACENTESIS dated 04/13/2009; [place] THORA/PARACENTESIS dated 03/29/2009; DG ABD PORTABLE 1V dated 02/18/2009; DG CHEST 2 VIEW dated 08/22/2013; CT ANGIO CHEST AORTA W/CM & WO/CM dated 05/04/2013  FINDINGS: CTA CHEST FINDINGS  The patient has undergone aortic root replacement with aortic valve replacement. This appears satisfactory. In the interval since the prior exam, the aortic arch has continued to dilate distal to the isthmus, and the dissection now off shows a large area of communication. The aortic lumen measures 61 mm maximally. The dissection flap remains present within the lumen, thicker than on prior, with the goal opacification of the entire aorta.  There is no hemothorax. No hemopericardium. Tortuous ectatic aortic branch vessels. There is no axillary adenopathy. Small mediastinal lymph nodes are present which are probably congestive. Heart size as enlarged compared to the prior exam with new cardiomegaly since the prior CT. Small bilateral right greater than left pleural effusions are present with associated atelectasis. Scattered foci of airspace opacity are present in the lungs, with upper lobe predominance. Although these could represent areas of infection, given the increase in the heart size, alveolar pulmonary edema is favored. Interlobular septal thickening is present at the apices, also consistent with CHF. Severe coronary artery atherosclerosis is incidentally noted. Thoracic spondylosis. Median sternotomy. No  aggressive osseous lesions.  8 mm right upper lobe pulmonary nodule is benign, stable since 2012.  Review of the MIP images confirms the above findings.  CTA ABDOMEN AND PELVIS FINDINGS  The thoracic  aortic aneurysm tapers to more normal caliber in the abdomen. Just inferior to the aortic hiatus, the abdominal aorta measures 29 mm. Ectasia of the infrarenal abdominal aorta measuring 26 mm. Iliofemoral atherosclerosis is present.  Cholelithiasis. Small amount of ascites. Right renal cysts. Prostatomegaly with 54 cm transverse prostate span. Fat containing periumbilical hernia. Small bilateral inguinal hernia is. Left inguinal hernia contains a portion of the sigmoid colon. Severe colonic diverticulosis. Right inguinal hernia sac contains ascites. No aggressive osseous lesions. Lumbar spondylosis and facet arthrosis. Visceral atherosclerosis.  Review of the MIP images confirms the above findings.  IMPRESSION: 1. Cardiomegaly and mild CHF. Right-greater-than-left small bilateral pleural effusions. 2. Multifocal airspace disease in the lungs is favored to represent alveolar edema rather than multifocal pneumonia. 3. 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 Maximal luminal diameter is 62 mm on today's study, increased from 45 mm on the prior exam. The septum from the dissection appears to have torn with the aneurysmal dilation of the aorta. Significantly, no intramural or periaortic hematoma to suggest rupture. 4. Infrarenal abdominal aortic ectasia, cholelithiasis and bilateral inguinal hernias. 5. These results were called by telephone at the time of interpretation on 08/22/2013 at 12:23 PM to Dr. Devoria Albe , who verbally acknowledged these results. 6. Benign 8 mm right upper lobe pulmonary nodule.   Electronically Signed   By: Andreas Newport M.D.   On: 08/22/2013 12:24   Dg Chest Port 1 View  08/24/2013   CLINICAL DATA:  CHF and 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  EXAM: PORTABLE CHEST - 1 VIEW  COMPARISON:  CT ANGIO CHEST W/CM &/OR WO/CM dated 08/22/2013; DG CHEST 2 VIEW dated 08/22/2013; CT ANGIO CHEST AORTA W/CM &  WO/CM dated 05/04/2013; DG CHEST 1V PORT dated 05/12/2013  FINDINGS: Stable prominence at aortic contour along the left mediastinum corresponding to the proximal descending thoracic aorta. There is stable cardiomegaly. CHF appears improved since the prior chest x-ray with no significant residual edema. Pleural effusions have resolved.  IMPRESSION: Resolution of CHF and pleural effusions. 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   Electronically Signed   By: Irish Lack M.D.   On: 08/24/2013 08:34   Ct Angio Abd/pel W/ And/or W/o  08/22/2013   CLINICAL DATA:  Short of breath. Weight gain. Swelling. Abnormal chest radiograph. Known aortic dissection with change in the appearance of the mediastinum compared to prior.  EXAM: CT ANGIOGRAPHY CHEST, ABDOMEN AND PELVIS  TECHNIQUE: Multidetector CT imaging through the chest, abdomen and pelvis was performed using the standard protocol during bolus administration of intravenous contrast. Multiplanar reconstructed images and MIPs were obtained and reviewed to evaluate the vascular anatomy.  CONTRAST:  75mL OMNIPAQUE IOHEXOL 350 MG/ML SOLN  COMPARISON:  [place] THORA/PARACENTESIS dated 04/13/2009; [place] THORA/PARACENTESIS dated 03/29/2009; DG ABD PORTABLE 1V dated 02/18/2009; DG CHEST 2 VIEW dated 08/22/2013; CT ANGIO CHEST AORTA W/CM & WO/CM dated 05/04/2013  FINDINGS: CTA CHEST FINDINGS  The patient has undergone aortic root replacement with aortic valve replacement. This appears satisfactory. In the interval since the prior exam, the aortic arch has continued to dilate distal to the isthmus, and the dissection now off shows a large  area of communication. The aortic lumen measures 61 mm maximally. The dissection flap remains present within the lumen, thicker than on prior, with the goal opacification of the entire aorta.  There is no hemothorax. No hemopericardium. Tortuous ectatic aortic branch  vessels. There is no axillary adenopathy. Small mediastinal lymph nodes are present which are probably congestive. Heart size as enlarged compared to the prior exam with new cardiomegaly since the prior CT. Small bilateral right greater than left pleural effusions are present with associated atelectasis. Scattered foci of airspace opacity are present in the lungs, with upper lobe predominance. Although these could represent areas of infection, given the increase in the heart size, alveolar pulmonary edema is favored. Interlobular septal thickening is present at the apices, also consistent with CHF. Severe coronary artery atherosclerosis is incidentally noted. Thoracic spondylosis. Median sternotomy. No aggressive osseous lesions.  8 mm right upper lobe pulmonary nodule is benign, stable since 2012.  Review of the MIP images confirms the above findings.  CTA ABDOMEN AND PELVIS FINDINGS  The thoracic aortic aneurysm tapers to more normal caliber in the abdomen. Just inferior to the aortic hiatus, the abdominal aorta measures 29 mm. Ectasia of the infrarenal abdominal aorta measuring 26 mm. Iliofemoral atherosclerosis is present.  Cholelithiasis. Small amount of ascites. Right renal cysts. Prostatomegaly with 54 cm transverse prostate span. Fat containing periumbilical hernia. Small bilateral inguinal hernia is. Left inguinal hernia contains a portion of the sigmoid colon. Severe colonic diverticulosis. Right inguinal hernia sac contains ascites. No aggressive osseous lesions. Lumbar spondylosis and facet arthrosis. Visceral atherosclerosis.  Review of the MIP images confirms the above findings.  IMPRESSION: 1. Cardiomegaly and mild CHF. Right-greater-than-left small bilateral pleural effusions. 2. Multifocal airspace disease in the lungs is favored to represent alveolar edema rather than multifocal pneumonia. 3. 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  Maximal luminal diameter is 62 mm on today's study, increased from 45 mm on the prior exam. The septum from the dissection appears to have torn with the aneurysmal dilation of the aorta. Significantly, no intramural or periaortic hematoma to suggest rupture. 4. Infrarenal abdominal aortic ectasia, cholelithiasis and bilateral inguinal hernias. 5. These results were called by telephone at the time of interpretation on 08/22/2013 at 12:23 PM to Dr. Devoria Albe , who verbally acknowledged these results. 6. Benign 8 mm right upper lobe pulmonary nodule.   Electronically Signed   By: Andreas Newport M.D.   On: 08/22/2013 12:24      ASSESSMENT/PLAN:  1.  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  The dimension is now 62mm.  Plan for surgery per CVTS tomorrow.   2.  HTN not adequately controlled.  His cardiazem and beta blocker were stopped due to bradycardia which has resolved. Will restart low dose lopressor 25mg  BID. Continue ACE I and hydralazine.   3.  Acute diastolic CHF diuresing well on Lasix IV.  He is 4L net negative today.   4. Hypokalemia secondary to diuretics - will replete 5.  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 Continue ASA/statin 6.  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  Low normal LVF EF 50-55% 8.  PAF maintaining NSR - continue Jessica Priest, MD  08/25/2013  10:57 AM

## 2013-08-25 NOTE — Progress Notes (Signed)
Discussed mobility with pt and family for postop. Family has good understanding of diagnosis and postop course. He has done CRPII x2. Encouraged low sodium diet as well.  [id number] Ethelda Chick CES, ACSM 3:01 PM 08/25/2013

## 2013-08-26 ENCOUNTER — Encounter (HOSPITAL_COMMUNITY)
Admission: EM | Disposition: A | Discharge status: Home / Self Care | Payer: Self-pay | Source: Home / Self Care | Attending: Internal Medicine

## 2013-08-26 ENCOUNTER — Encounter (HOSPITAL_COMMUNITY): Payer: Self-pay | Admitting: Anesthesiology

## 2013-08-26 ENCOUNTER — Inpatient Hospital Stay (HOSPITAL_COMMUNITY): Payer: Medicare Other | Admitting: Certified Registered Nurse Anesthetist

## 2013-08-26 ENCOUNTER — Encounter (HOSPITAL_COMMUNITY): Payer: Medicare Other | Admitting: Certified Registered Nurse Anesthetist

## 2013-08-26 ENCOUNTER — Inpatient Hospital Stay (HOSPITAL_COMMUNITY): Payer: Medicare Other

## 2013-08-26 DIAGNOSIS — I712 Thoracic aortic aneurysm, without rupture, unspecified: Secondary | ICD-10-CM

## 2013-08-26 DIAGNOSIS — I472 Ventricular tachycardia: Secondary | ICD-10-CM

## 2013-08-26 DIAGNOSIS — I4729 Other ventricular tachycardia: Secondary | ICD-10-CM

## 2013-08-26 HISTORY — PX: ENDOVASCULAR STENT INSERTION: SHX5161

## 2013-08-26 HISTORY — PX: CAROTID-SUBCLAVIAN BYPASS GRAFT: SHX910

## 2013-08-26 LAB — BLOOD GAS, ARTERIAL
Acid-Base Excess: 0.2 mmol/L (ref 0.0–2.0)
Bicarbonate: 25.1 mEq/L — ABNORMAL HIGH (ref 20.0–24.0)
Delivery systems: POSITIVE
EXPIRATORY PAP: 6
FIO2: 0.5 %
Inspiratory PAP: 12
Mode: POSITIVE
O2 Saturation: 98.4 %
PCO2 ART: 46.6 mmHg — AB (ref 35.0–45.0)
PO2 ART: 110 mmHg — AB (ref 80.0–100.0)
Patient temperature: 98.6
TCO2: 26.5 mmol/L (ref 0–100)
pH, Arterial: 7.35 (ref 7.350–7.450)

## 2013-08-26 LAB — POCT I-STAT 4, (NA,K, GLUC, HGB,HCT)
GLUCOSE: 110 mg/dL — AB (ref 70–99)
HCT: 35 % — ABNORMAL LOW (ref 39.0–52.0)
HEMOGLOBIN: 11.9 g/dL — AB (ref 13.0–17.0)
POTASSIUM: 3.7 meq/L (ref 3.7–5.3)
Sodium: 140 mEq/L (ref 137–147)

## 2013-08-26 LAB — POCT I-STAT 7, (LYTES, BLD GAS, ICA,H+H)
Acid-Base Excess: 8 mmol/L — ABNORMAL HIGH (ref 0.0–2.0)
BICARBONATE: 31 meq/L — AB (ref 20.0–24.0)
Calcium, Ion: 1.05 mmol/L — ABNORMAL LOW (ref 1.13–1.30)
HEMATOCRIT: 27 % — AB (ref 39.0–52.0)
HEMOGLOBIN: 9.2 g/dL — AB (ref 13.0–17.0)
O2 Saturation: 100 %
PCO2 ART: 35.8 mmHg (ref 35.0–45.0)
POTASSIUM: 2.9 meq/L — AB (ref 3.7–5.3)
Patient temperature: 36.7
SODIUM: 141 meq/L (ref 137–147)
TCO2: 32 mmol/L (ref 0–100)
pH, Arterial: 7.545 — ABNORMAL HIGH (ref 7.350–7.450)
pO2, Arterial: 408 mmHg — ABNORMAL HIGH (ref 80.0–100.0)

## 2013-08-26 LAB — GLUCOSE, CAPILLARY: Glucose-Capillary: 98 mg/dL (ref 70–99)

## 2013-08-26 SURGERY — CREATION, BYPASS, ARTERIAL, SUBCLAVIAN TO CAROTID, USING GRAFT
Anesthesia: General | Site: Neck

## 2013-08-26 MED ORDER — ALBUMIN HUMAN 5 % IV SOLN
INTRAVENOUS | Status: DC | PRN
  Administered 2013-08-26: 10:00:00 via INTRAVENOUS

## 2013-08-26 MED ORDER — PROTAMINE SULFATE 10 MG/ML IV SOLN
INTRAVENOUS | Status: DC | PRN
  Administered 2013-08-26: 50 mg via INTRAVENOUS

## 2013-08-26 MED ORDER — ARTIFICIAL TEARS OP OINT
TOPICAL_OINTMENT | OPHTHALMIC | Status: DC | PRN
  Administered 2013-08-26: 1 via OPHTHALMIC

## 2013-08-26 MED ORDER — LIDOCAINE HCL (PF) 1 % IJ SOLN
INTRAMUSCULAR | Status: AC
  Filled 2013-08-26: qty 30

## 2013-08-26 MED ORDER — HEMOSTATIC AGENTS (NO CHARGE) OPTIME
TOPICAL | Status: DC | PRN
  Administered 2013-08-26 (×2): 1 via TOPICAL

## 2013-08-26 MED ORDER — PROPOFOL 10 MG/ML IV BOLUS
INTRAVENOUS | Status: DC | PRN
  Administered 2013-08-26: 80 mg via INTRAVENOUS

## 2013-08-26 MED ORDER — GLYCOPYRROLATE 0.2 MG/ML IJ SOLN
INTRAMUSCULAR | Status: DC | PRN
  Administered 2013-08-26 (×2): 0.1 mg via INTRAVENOUS
  Administered 2013-08-26: .6 mg via INTRAVENOUS
  Administered 2013-08-26: 0.1 mg via INTRAVENOUS

## 2013-08-26 MED ORDER — METOPROLOL TARTRATE 1 MG/ML IV SOLN
2.0000 mg | INTRAVENOUS | Status: DC | PRN
  Filled 2013-08-26: qty 5

## 2013-08-26 MED ORDER — SODIUM CHLORIDE 0.9 % IV SOLN
20.0000 mg | INTRAVENOUS | Status: DC | PRN
  Administered 2013-08-26: 50 ug/min via INTRAVENOUS

## 2013-08-26 MED ORDER — PROPOFOL 10 MG/ML IV BOLUS
INTRAVENOUS | Status: AC
  Filled 2013-08-26: qty 20

## 2013-08-26 MED ORDER — CHLORHEXIDINE GLUCONATE 4 % EX LIQD
CUTANEOUS | Status: AC
  Filled 2013-08-26: qty 15

## 2013-08-26 MED ORDER — NEOSTIGMINE METHYLSULFATE 1 MG/ML IJ SOLN
INTRAMUSCULAR | Status: DC | PRN
  Administered 2013-08-26: 4 mg via INTRAVENOUS

## 2013-08-26 MED ORDER — SODIUM CHLORIDE 0.9 % IV SOLN: 500.0000 mL | Freq: Once | INTRAVENOUS | Status: AC | PRN

## 2013-08-26 MED ORDER — DEXTROSE-NACL 5-0.45 % IV SOLN
INTRAVENOUS | Status: DC
  Administered 2013-08-26 – 2013-08-27 (×2): via INTRAVENOUS

## 2013-08-26 MED ORDER — DOCUSATE SODIUM 100 MG PO CAPS
100.0000 mg | ORAL_CAPSULE | Freq: Every day | ORAL | Status: DC
  Administered 2013-08-27 – 2013-08-28 (×2): 100 mg via ORAL
  Filled 2013-08-26 (×3): qty 1

## 2013-08-26 MED ORDER — MIDAZOLAM HCL 5 MG/5ML IJ SOLN
INTRAMUSCULAR | Status: DC | PRN
  Administered 2013-08-26 (×2): 1 mg via INTRAVENOUS

## 2013-08-26 MED ORDER — ONDANSETRON HCL 4 MG/2ML IJ SOLN
4.0000 mg | Freq: Four times a day (QID) | INTRAMUSCULAR | Status: DC | PRN
  Filled 2013-08-26: qty 2

## 2013-08-26 MED ORDER — MAGNESIUM SULFATE 50 % IJ SOLN
INTRAMUSCULAR | Status: DC | PRN
  Administered 2013-08-26: 2 g via INTRAVENOUS

## 2013-08-26 MED ORDER — EPHEDRINE SULFATE 50 MG/ML IJ SOLN
INTRAMUSCULAR | Status: AC
  Filled 2013-08-26: qty 1

## 2013-08-26 MED ORDER — HEPARIN SODIUM (PORCINE) 1000 UNIT/ML IJ SOLN
INTRAMUSCULAR | Status: AC
  Filled 2013-08-26: qty 1

## 2013-08-26 MED ORDER — ACETAMINOPHEN 325 MG RE SUPP
325.0000 mg | RECTAL | Status: DC | PRN
  Filled 2013-08-26: qty 2

## 2013-08-26 MED ORDER — ROCURONIUM BROMIDE 100 MG/10ML IV SOLN
INTRAVENOUS | Status: DC | PRN
  Administered 2013-08-26: 50 mg via INTRAVENOUS
  Administered 2013-08-26 (×2): 20 mg via INTRAVENOUS
  Administered 2013-08-26: 30 mg via INTRAVENOUS

## 2013-08-26 MED ORDER — POTASSIUM CHLORIDE 10 MEQ/100ML IV SOLN
INTRAVENOUS | Status: DC | PRN
  Administered 2013-08-26 (×2): 10 meq via INTRAVENOUS

## 2013-08-26 MED ORDER — LACTATED RINGERS IV SOLN
INTRAVENOUS | Status: DC | PRN
  Administered 2013-08-26 (×2): via INTRAVENOUS

## 2013-08-26 MED ORDER — ROCURONIUM BROMIDE 50 MG/5ML IV SOLN
INTRAVENOUS | Status: AC
  Filled 2013-08-26: qty 1

## 2013-08-26 MED ORDER — ONDANSETRON HCL 4 MG/2ML IJ SOLN
INTRAMUSCULAR | Status: DC | PRN
  Administered 2013-08-26: 4 mg via INTRAVENOUS

## 2013-08-26 MED ORDER — FENTANYL CITRATE 0.05 MG/ML IJ SOLN
INTRAMUSCULAR | Status: AC
  Filled 2013-08-26: qty 5

## 2013-08-26 MED ORDER — LIDOCAINE HCL (CARDIAC) 20 MG/ML IV SOLN
INTRAVENOUS | Status: DC | PRN
  Administered 2013-08-26: 100 mg via INTRAVENOUS

## 2013-08-26 MED ORDER — SODIUM CHLORIDE 0.9 % IJ SOLN
INTRAMUSCULAR | Status: AC
  Filled 2013-08-26: qty 10

## 2013-08-26 MED ORDER — ARTIFICIAL TEARS OP OINT
TOPICAL_OINTMENT | OPHTHALMIC | Status: AC
  Filled 2013-08-26: qty 3.5

## 2013-08-26 MED ORDER — ALUM & MAG HYDROXIDE-SIMETH 200-200-20 MG/5ML PO SUSP
15.0000 mL | ORAL | Status: DC | PRN
  Filled 2013-08-26: qty 30

## 2013-08-26 MED ORDER — LACTATED RINGERS IV SOLN
INTRAVENOUS | Status: DC | PRN
  Administered 2013-08-26: 07:00:00 via INTRAVENOUS

## 2013-08-26 MED ORDER — ONDANSETRON HCL 4 MG/2ML IJ SOLN
INTRAMUSCULAR | Status: AC
  Filled 2013-08-26: qty 2

## 2013-08-26 MED ORDER — SUCCINYLCHOLINE CHLORIDE 20 MG/ML IJ SOLN
INTRAMUSCULAR | Status: AC
  Filled 2013-08-26: qty 1

## 2013-08-26 MED ORDER — PHENOL 1.4 % MT LIQD
1.0000 | OROMUCOSAL | Status: DC | PRN
  Administered 2013-08-27 (×2): 1 via OROMUCOSAL
  Filled 2013-08-26: qty 177

## 2013-08-26 MED ORDER — PHENYLEPHRINE 40 MCG/ML (10ML) SYRINGE FOR IV PUSH (FOR BLOOD PRESSURE SUPPORT)
PREFILLED_SYRINGE | INTRAVENOUS | Status: AC
  Filled 2013-08-26: qty 10

## 2013-08-26 MED ORDER — SODIUM CHLORIDE 0.9 % IR SOLN
Status: DC | PRN
  Administered 2013-08-26 (×2)

## 2013-08-26 MED ORDER — FUROSEMIDE 10 MG/ML IJ SOLN
INTRAMUSCULAR | Status: AC
  Administered 2013-08-26: 60 mg via INTRAVENOUS
  Filled 2013-08-26: qty 4

## 2013-08-26 MED ORDER — MAGNESIUM SULFATE 40 MG/ML IJ SOLN
2.0000 g | Freq: Every day | INTRAMUSCULAR | Status: DC | PRN
  Filled 2013-08-26: qty 50

## 2013-08-26 MED ORDER — DEXTROSE 5 % IV SOLN
1.5000 g | INTRAVENOUS | Status: AC
  Filled 2013-08-26: qty 1.5

## 2013-08-26 MED ORDER — IODIXANOL 320 MG/ML IV SOLN
INTRAVENOUS | Status: DC | PRN
  Administered 2013-08-26: 43 mL via INTRAVENOUS

## 2013-08-26 MED ORDER — FUROSEMIDE 10 MG/ML IJ SOLN
20.0000 mg | Freq: Once | INTRAMUSCULAR | Status: AC
  Administered 2013-08-26: 20 mg via INTRAVENOUS

## 2013-08-26 MED ORDER — 0.9 % SODIUM CHLORIDE (POUR BTL) OPTIME
TOPICAL | Status: DC | PRN
  Administered 2013-08-26: 2000 mL

## 2013-08-26 MED ORDER — PANTOPRAZOLE SODIUM 40 MG PO TBEC
40.0000 mg | DELAYED_RELEASE_TABLET | Freq: Every day | ORAL | Status: DC
  Administered 2013-08-27 – 2013-08-29 (×3): 40 mg via ORAL
  Filled 2013-08-26 (×3): qty 1

## 2013-08-26 MED ORDER — MIDAZOLAM HCL 2 MG/2ML IJ SOLN
INTRAMUSCULAR | Status: AC
  Filled 2013-08-26: qty 2

## 2013-08-26 MED ORDER — ACETAMINOPHEN 325 MG PO TABS
325.0000 mg | ORAL_TABLET | ORAL | Status: DC | PRN
  Administered 2013-08-27 (×2): 650 mg via ORAL
  Filled 2013-08-26 (×3): qty 2

## 2013-08-26 MED ORDER — FUROSEMIDE 10 MG/ML IJ SOLN
60.0000 mg | Freq: Once | INTRAMUSCULAR | Status: AC
  Administered 2013-08-26: 60 mg via INTRAVENOUS

## 2013-08-26 MED ORDER — FUROSEMIDE 10 MG/ML IJ SOLN
INTRAMUSCULAR | Status: AC
  Administered 2013-08-26: 20 mg via INTRAVENOUS
  Filled 2013-08-26: qty 4

## 2013-08-26 MED ORDER — GUAIFENESIN-DM 100-10 MG/5ML PO SYRP
15.0000 mL | ORAL_SOLUTION | ORAL | Status: DC | PRN
  Filled 2013-08-26: qty 15

## 2013-08-26 MED ORDER — HYDRALAZINE HCL 20 MG/ML IJ SOLN
10.0000 mg | INTRAMUSCULAR | Status: DC | PRN
  Administered 2013-08-26: 10 mg via INTRAVENOUS
  Filled 2013-08-26: qty 1
  Filled 2013-08-26: qty 0.5

## 2013-08-26 MED ORDER — LABETALOL HCL 5 MG/ML IV SOLN
10.0000 mg | INTRAVENOUS | Status: DC | PRN
  Filled 2013-08-26: qty 4

## 2013-08-26 MED ORDER — POTASSIUM CHLORIDE 10 MEQ/50ML IV SOLN
10.0000 meq | INTRAVENOUS | Status: AC
  Administered 2013-08-26 (×2): 10 meq via INTRAVENOUS
  Filled 2013-08-26: qty 50

## 2013-08-26 MED ORDER — DEXTROSE 5 % IV SOLN
1.5000 g | Freq: Two times a day (BID) | INTRAVENOUS | Status: AC
  Administered 2013-08-26 – 2013-08-27 (×2): 1.5 g via INTRAVENOUS
  Filled 2013-08-26 (×3): qty 1.5

## 2013-08-26 MED ORDER — FENTANYL CITRATE 0.05 MG/ML IJ SOLN
INTRAMUSCULAR | Status: DC | PRN
  Administered 2013-08-26: 50 ug via INTRAVENOUS
  Administered 2013-08-26: 100 ug via INTRAVENOUS
  Administered 2013-08-26: 150 ug via INTRAVENOUS
  Administered 2013-08-26: 50 ug via INTRAVENOUS

## 2013-08-26 MED ORDER — POTASSIUM CHLORIDE CRYS ER 20 MEQ PO TBCR
20.0000 meq | EXTENDED_RELEASE_TABLET | Freq: Every day | ORAL | Status: DC | PRN
  Filled 2013-08-26: qty 2

## 2013-08-26 SURGICAL SUPPLY — 111 items
BAG BANDED W/RUBBER/TAPE 36X54 (MISCELLANEOUS) ×4 IMPLANT
BAG DECANTER FOR FLEXI CONT (MISCELLANEOUS) ×4 IMPLANT
BAG SNAP BAND KOVER 36X36 (MISCELLANEOUS) ×4 IMPLANT
BALLN CODA OCL 2-9.0-35-120-3 (BALLOONS) ×4
BALLOON COD OCL 2-9.0-35-120-3 (BALLOONS) ×2 IMPLANT
BENZOIN TINCTURE PRP APPL 2/3 (GAUZE/BANDAGES/DRESSINGS) ×4 IMPLANT
BLADE SURG ROTATE 9660 (MISCELLANEOUS) ×4 IMPLANT
CANISTER SUCTION 2500CC (MISCELLANEOUS) ×8 IMPLANT
CATH ACCU-VU SIZ PIG 5F 100CM (CATHETERS) ×4 IMPLANT
CATH BALLN TRILOBE 26-42 (BALLOONS) ×4 IMPLANT
CATH BEACON 5.038 65CM KMP-01 (CATHETERS) ×4 IMPLANT
CATH ROBINSON RED A/P 18FR (CATHETERS) ×4 IMPLANT
CATH SUCT 10FR WHISTLE TIP (CATHETERS) IMPLANT
CLIP TI MEDIUM 24 (CLIP) ×4 IMPLANT
CLIP TI WIDE RED SMALL 24 (CLIP) ×4 IMPLANT
CLOSURE WOUND 1/2 X4 (GAUZE/BANDAGES/DRESSINGS) ×1
COVER MAYO STAND STRL (DRAPES) ×4 IMPLANT
COVER SURGICAL LIGHT HANDLE (MISCELLANEOUS) ×12 IMPLANT
CRADLE DONUT ADULT HEAD (MISCELLANEOUS) ×4 IMPLANT
DERMABOND ADVANCED (GAUZE/BANDAGES/DRESSINGS) ×2
DERMABOND ADVANCED .7 DNX12 (GAUZE/BANDAGES/DRESSINGS) ×2 IMPLANT
DEVICE CLOSURE PERCLS PRGLD 6F (VASCULAR PRODUCTS) ×4 IMPLANT
DRAIN CHANNEL 10F 3/8 F FF (DRAIN) IMPLANT
DRAIN CHANNEL 10M FLAT 3/4 FLT (DRAIN) IMPLANT
DRAIN CHANNEL 15F RND FF W/TCR (WOUND CARE) ×4 IMPLANT
DRAPE C-ARM 42X72 X-RAY (DRAPES) ×4 IMPLANT
DRAPE TABLE COVER HEAVY DUTY (DRAPES) ×4 IMPLANT
DRAPE WARM FLUID 44X44 (DRAPE) ×4 IMPLANT
DRESSING OPSITE X SMALL 2X3 (GAUZE/BANDAGES/DRESSINGS) ×4 IMPLANT
DRSG AQUACEL AG ADV 3.5X14 (GAUZE/BANDAGES/DRESSINGS) ×4 IMPLANT
DRYSEAL FLEXSHEATH 24FR 33CM (SHEATH) ×4
ELECT CAUTERY BLADE 6.4 (BLADE) ×4 IMPLANT
ELECT REM PT RETURN 9FT ADLT (ELECTROSURGICAL) ×12
ELECTRODE REM PT RTRN 9FT ADLT (ELECTROSURGICAL) ×6 IMPLANT
ENDOPROSTHESIS THORAC 40X40X20 (Endovascular Graft) ×2 IMPLANT
ENDOPROTH THORACIC 40X40X20 (Endovascular Graft) ×4 IMPLANT
ENDOPROTH THORACIC 45X45X15 (Endovascular Graft) ×4 IMPLANT
EVACUATOR 3/16  PVC DRAIN (DRAIN)
EVACUATOR 3/16 PVC DRAIN (DRAIN) IMPLANT
EVACUATOR SILICONE 100CC (DRAIN) ×4 IMPLANT
GAUZE SPONGE 2X2 8PLY STRL LF (GAUZE/BANDAGES/DRESSINGS) ×2 IMPLANT
GAUZE SPONGE 4X4 16PLY XRAY LF (GAUZE/BANDAGES/DRESSINGS) ×8 IMPLANT
GLOVE BIO SURGEON STRL SZ 6.5 (GLOVE) ×6 IMPLANT
GLOVE BIO SURGEON STRL SZ8 (GLOVE) ×4 IMPLANT
GLOVE BIO SURGEONS STRL SZ 6.5 (GLOVE) ×2
GLOVE BIOGEL PI IND STRL 7.5 (GLOVE) ×2 IMPLANT
GLOVE BIOGEL PI IND STRL 8.5 (GLOVE) ×2 IMPLANT
GLOVE BIOGEL PI INDICATOR 7.5 (GLOVE) ×2
GLOVE BIOGEL PI INDICATOR 8.5 (GLOVE) ×2
GLOVE SURG SS PI 7.0 STRL IVOR (GLOVE) ×4 IMPLANT
GLOVE SURG SS PI 7.5 STRL IVOR (GLOVE) ×4 IMPLANT
GOWN STRL REUS W/ TWL LRG LVL3 (GOWN DISPOSABLE) ×12 IMPLANT
GOWN STRL REUS W/ TWL XL LVL3 (GOWN DISPOSABLE) ×2 IMPLANT
GOWN STRL REUS W/TWL LRG LVL3 (GOWN DISPOSABLE) ×12
GOWN STRL REUS W/TWL XL LVL3 (GOWN DISPOSABLE) ×2
GRAFT HEMASHIELD 8MM (Vascular Products) ×2 IMPLANT
GRAFT VASC STRG 30X8KNIT (Vascular Products) ×2 IMPLANT
GUIDEWIRE LUND STIFF .035X300 (WIRE) ×4 IMPLANT
HEMOSTAT SNOW SURGICEL 2X4 (HEMOSTASIS) IMPLANT
HEMOSTAT SURGICEL 2X14 (HEMOSTASIS) IMPLANT
KIT BASIN OR (CUSTOM PROCEDURE TRAY) ×8 IMPLANT
KIT ROOM TURNOVER OR (KITS) ×8 IMPLANT
LOOP VESSEL MINI RED (MISCELLANEOUS) ×4 IMPLANT
NEEDLE PERC 18GX7CM (NEEDLE) ×4 IMPLANT
NS IRRIG 1000ML POUR BTL (IV SOLUTION) ×16 IMPLANT
PACK AORTA (CUSTOM PROCEDURE TRAY) ×4 IMPLANT
PACK CAROTID (CUSTOM PROCEDURE TRAY) ×4 IMPLANT
PAD ARMBOARD 7.5X6 YLW CONV (MISCELLANEOUS) ×16 IMPLANT
PENCIL BUTTON HOLSTER BLD 10FT (ELECTRODE) ×4 IMPLANT
PERCLOSE PROGLIDE 6F (VASCULAR PRODUCTS) ×8
PROTECTION STATION PRESSURIZED (MISCELLANEOUS) ×4
PUNCH AORTIC ROTATE 5MM 8IN (MISCELLANEOUS) ×4 IMPLANT
SHEATH AVANTI 11CM 8FR (MISCELLANEOUS) ×4 IMPLANT
SHEATH DRYSEAL FLEX 24FR 33CM (SHEATH) ×4 IMPLANT
SHIELD RADPAD SCOOP 12X17 (MISCELLANEOUS) ×8 IMPLANT
SHUNT CAROTID BYPASS 10 (VASCULAR PRODUCTS) IMPLANT
SHUNT CAROTID BYPASS 12 (VASCULAR PRODUCTS) IMPLANT
SPONGE GAUZE 2X2 STER 10/PKG (GAUZE/BANDAGES/DRESSINGS) ×2
SPONGE SURGIFOAM ABS GEL 100 (HEMOSTASIS) IMPLANT
STATION PROTECTION PRESSURIZED (MISCELLANEOUS) ×2 IMPLANT
STOPCOCK MORSE 400PSI 3WAY (MISCELLANEOUS) ×4 IMPLANT
STRIP CLOSURE SKIN 1/2X4 (GAUZE/BANDAGES/DRESSINGS) ×3 IMPLANT
SUT ETHIBOND NAB CT1 #1 30IN (SUTURE) ×8 IMPLANT
SUT ETHILON 3 0 PS 1 (SUTURE) ×4 IMPLANT
SUT PROLENE 5 0 C 1 24 (SUTURE) ×8 IMPLANT
SUT PROLENE 5 0 C 1 36 (SUTURE) ×16 IMPLANT
SUT PROLENE 6 0 BV (SUTURE) ×8 IMPLANT
SUT PROLENE 6 0 C 1 30 (SUTURE) IMPLANT
SUT PROLENE 7 0 BV 1 (SUTURE) IMPLANT
SUT SILK 3 0 (SUTURE) ×4
SUT SILK 3-0 18XBRD TIE 12 (SUTURE) ×4 IMPLANT
SUT VIC AB 2-0 CTX 36 (SUTURE) ×8 IMPLANT
SUT VIC AB 3-0 SH 27 (SUTURE) ×6
SUT VIC AB 3-0 SH 27X BRD (SUTURE) ×6 IMPLANT
SUT VICRYL 4-0 PS2 18IN ABS (SUTURE) IMPLANT
SYR 20CC LL (SYRINGE) ×8 IMPLANT
SYR 30ML SLIP (SYRINGE) IMPLANT
SYR 50ML LL SCALE MARK (SYRINGE) ×4 IMPLANT
SYR 5ML LL (SYRINGE) ×4 IMPLANT
SYR MEDRAD MARK V 150ML (SYRINGE) ×4 IMPLANT
SYRINGE 10CC LL (SYRINGE) ×12 IMPLANT
TOWEL OR 17X24 6PK STRL BLUE (TOWEL DISPOSABLE) ×8 IMPLANT
TOWEL OR 17X26 10 PK STRL BLUE (TOWEL DISPOSABLE) ×12 IMPLANT
TRAY FOLEY CATH 14FRSI W/METER (CATHETERS) ×4 IMPLANT
TRAY FOLEY CATH 16FRSI W/METER (SET/KITS/TRAYS/PACK) ×4 IMPLANT
TUBE SUCT INTRACARD DLP 20F (MISCELLANEOUS) ×4 IMPLANT
TUBE SUCTION CARDIAC 10FR (CANNULA) ×4 IMPLANT
TUBING HIGH PRESSURE 120CM (CONNECTOR) ×4 IMPLANT
WATER STERILE IRR 1000ML POUR (IV SOLUTION) ×8 IMPLANT
WIRE BENTSON .035X145CM (WIRE) ×8 IMPLANT
WIRE HI TORQ VERSACORE J 260CM (WIRE) ×4 IMPLANT

## 2013-08-26 NOTE — Brief Op Note (Signed)
08/26/2013  1:55 PM  PATIENT:  [name] [name]  73 y.o. 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  PROCEDURE:  Procedure(s): BYPASS GRAFT CAROTID-SUBCLAVIAN (Left)  THORACIC STENT GRAFT INSERTION (N/A)  SURGEON:  Surgeon(s) and Role:     Nada Libman, MD - [place]    * Delight Ovens, MD - [place]   ASSISTANTS: none   ANESTHESIA:   general  EBL:  Total I/O In: 2250 [I.V.:2000; IV Piggyback:250] Out: 1350 [Urine:1250; Blood:100]  BLOOD ADMINISTERED:none  DRAINS: (10) Blake drain(s) in the left neck   LOCAL MEDICATIONS USED:  NONE  SPECIMEN:  No Specimen  DISPOSITION OF SPECIMEN:  N/A  COUNTS:  YES   DICTATION: .Dragon Dictation  PLAN OF CARE: already inpatient  PATIENT DISPOSITION:  PACU - hemodynamically stable.   Delay start of Pharmacological VTE agent (>24hrs) due to surgical blood loss or risk of bleeding: yes  Episode of unsustained VT VF at end of case with out hemodynamic change. Heart rate 45

## 2013-08-26 NOTE — Progress Notes (Signed)
Pt seen by Dr.Fitzgerald, ABG results reviewed with him, ok per Dr.Fitzgerald to be sent to icu room on Bipap, Dr.brabham called and updated on pt status, no new orders, will cont to monitor.

## 2013-08-26 NOTE — Progress Notes (Signed)
Patient ID: [name] [name], male   DOB: [birth year], 73 y.o.   MRN: [id number]  SICU Evening Rounds:  Hemodynamically stable  On cpap  Follows commands. Good strength in all extremities. Feet warm and well-perfused. Left radial pulse palpable.  Left groin ok  Left neck with mild swelling. Stable. Minimal output from drain.  K 2.9 early this afternoon and reportedly replaced. Will repeat now.

## 2013-08-26 NOTE — Preoperative (Addendum)
Beta Blockers   Reason not to administer Beta Blockersp:pt received 12.5 mg metoprolol po @0630  on 08/26/2013

## 2013-08-26 NOTE — Anesthesia Procedure Notes (Signed)
Procedure Name: Intubation Performed by: Wray Kearns A Pre-anesthesia Checklist: Patient identified, Emergency Drugs available, Suction available, Patient being monitored and Timeout performed Patient Re-evaluated:Patient Re-evaluated prior to inductionOxygen Delivery Method: Circle system utilized Preoxygenation: Pre-oxygenation with 100% oxygen Intubation Type: IV induction Ventilation: Mask ventilation without difficulty Laryngoscope Size: Mac and 4 Grade View: Grade I Tube type: Oral Number of attempts: 1 Airway Equipment and Method: Stylet

## 2013-08-26 NOTE — Progress Notes (Signed)
Pt placed on BiPap as per Dr.Moser orders, IV Lasix was given 20mg  IV, and last Potassium infusing at this time

## 2013-08-26 NOTE — Progress Notes (Addendum)
Post op TEVAR and left carotid subclavian bypass.   Developed VF at the end of case which spontaneously resolved. Required BiPAP in the PACU for labored breathing. 80mg  Lasix given in PACU Neuroloically intact Mild left neck swelling.  Minimal JP output Left groin soft palpable left radial pulse Transfer to SICU Spoke with cardiology, who is going to re-evaluate given VF in OR   [name] [name]

## 2013-08-26 NOTE — Consult Note (Signed)
ELECTROPHYSIOLOGY CONSULT NOTE    Patient ID: [name] [name] MRN: [id number], DOB/AGE: 74-Feb-1942 74 y.o.  Admit date: 08/22/2013 Date of Consult: 08-26-13  Primary Cardiologist: [name]., MD  Reason for Consultation: ventricular arrhythmia during surgery  HPI:  [name] [name] is a 73 y.o. male with a past medical history of coronary disease (s/p BMS to Lcx in 2012), type A aortic dissection (s/p replacement of aortic valve and ascending aorta in 2010), atrial fibrillation (on Amiodarone).  He was found to have type B aortic dissection extending from the left subclavian to the diaphragm in 04/2013 that has been watched closely as an outpatient.  He was admitted 3-6 with acute heart failure and has undergone diuresis.  He underwent left carotid subclavian bypass, ligation of proximal left subclavian artery, and endovascular repair of thoracic aortic aneurysm..  When the surgery was ending, he reportedly had several runs of nonsustained polymorphic VT that were unfortunately unable to be recorded.  This followed by transient junctional rhythm.  His K was noted to be low at 2.9.  EP has been asked to evaluate for treatment options.    Of note, during the hospitalization, he has been found to have sinus bradycardia and intermittent junctional rhythm.  His amiodarone, lopressor, and cardizem were discontinued on 08-23-13.   ROS unable to be obtained due to sedation post surgery.   Past Medical History  Diagnosis Date  . Hyperlipidemia   . DVT (deep venous thrombosis)     post knee surgery  . Tobacco abuse   . HTN (hypertension)   . Descending aortic aneurysm   . Aortic valve insufficiency   . Thoracic aneurysm   . S/P AVR (aortic valve replacement)   . CAD (coronary artery disease)   . Atrial flutter      Surgical History:  Past Surgical History  Procedure Laterality Date  . Right groin lymphocele  [id number]  . Aortic valve replacement  [id number]  . Replacement total  knee      bilateral     Prescriptions prior to admission  Medication Sig Dispense Refill  . amiodarone (PACERONE) 400 MG tablet Take 1 tablet (400 mg total) by mouth daily.  60 tablet  3  . apixaban (ELIQUIS) 5 MG TABS tablet Take 5 mg by mouth 2 (two) times daily.      Marland Kitchen aspirin EC 81 MG tablet Take 81 mg by mouth daily.        Marland Kitchen atorvastatin (LIPITOR) 10 MG tablet Take 1 tablet (10 mg total) by mouth daily.  90 tablet  3  . diltiazem (CARDIZEM CD) 240 MG 24 hr capsule Take 1 capsule (240 mg total) by mouth 2 (two) times daily.  60 capsule  11  . enalapril (VASOTEC) 5 MG tablet Take 5 mg by mouth daily.        . furosemide (LASIX) 40 MG tablet Take 1 tablet (40 mg total) by mouth 2 (two) times daily.  60 tablet  11  . lansoprazole (PREVACID) 30 MG capsule Take 30 mg by mouth daily.       Marland Kitchen loratadine (CLARITIN) 10 MG tablet Take 10 mg by mouth daily.        . metoprolol (LOPRESSOR) 100 MG tablet Take 1 tablet (100 mg total) by mouth 2 (two) times daily.  60 tablet  7  . Multiple Vitamin (MULTIVITAMIN WITH MINERALS) TABS tablet Take 1 tablet by mouth daily.      . nitroGLYCERIN (NITROSTAT) 0.4 MG SL tablet  Place 0.4 mg under the tongue every 5 (five) minutes as needed.        . potassium chloride SA (K-DUR,KLOR-CON) 20 MEQ tablet Take 2 tablets (40 mEq total) by mouth daily.  60 tablet  11    Inpatient Medications:  . aminocaproic acid (AMICAR) for OHS   Intravenous To OR  . [facility] HOLD] amiodarone  400 mg Oral Daily  . [facility] HOLD] aspirin EC  81 mg Oral Daily  . [facility] HOLD] atorvastatin  10 mg Oral Daily  . [facility] HOLD] atropine  0.4 mg Intravenous Once  . cefUROXime (ZINACEF) 1.5 GM IVPB  1.5 g Intravenous To OR  . cefUROXime (ZINACEF)  IV  1.5 g Intravenous Q12H  . cefUROXime (ZINACEF)  IV  750 mg Intravenous To OR  . dexmedetomidine  0.1-0.7 mcg/kg/hr Intravenous To OR  . [START ON 08/27/2013] docusate sodium  100 mg Oral Daily  . DOPamine  2-20 mcg/kg/min Intravenous To OR  . [MAR  HOLD] enalapril  10 mg Oral Daily  . epinephrine  0.5-20 mcg/min Intravenous To OR  . [facility] HOLD] furosemide  40 mg Intravenous BID  . heparin-papaverine-plasmalyte irrigation   Irrigation To OR  . heparin 30,000 units/NS 1000 mL solution for CELLSAVER   Other To OR  . [MAR HOLD] heparin  5,000 Units Subcutaneous 3 times per day  . [facility] HOLD] hydrALAZINE  25 mg Oral 4 times per day  . insulin (NOVOLIN-R) infusion   Intravenous To OR  . [facility] HOLD] loratadine  10 mg Oral Daily  . magnesium sulfate  40 mEq Other To OR  . [facility] HOLD] metoprolol tartrate  25 mg Oral BID  . [MAR HOLD] multivitamin with minerals  1 tablet Oral Daily  . nitroGLYCERIN  2-200 mcg/min Intravenous To OR  . [MAR HOLD] pantoprazole  40 mg Oral Daily  . pantoprazole  40 mg Oral Daily  . phenylephrine (NEO-SYNEPHRINE) Adult infusion  30-200 mcg/min Intravenous To OR  . potassium chloride  80 mEq Other To OR  . [facility] HOLD] potassium chloride  40 mEq Oral Daily  . [facility] HOLD] sodium chloride  3 mL Intravenous Q12H  . vancomycin  1,250 mg Intravenous To OR    Allergies:  Allergies  Allergen Reactions  . Lortab [Hydrocodone-Acetaminophen] Hives  . Morphine And Related Hives    History   Social History  . Marital Status: Married    Spouse Name: N/A    Number of Children: N/A  . Years of Education: N/A   Occupational History  . Not on file.   Social History Main Topics  . Smoking status: Former Smoker    Types: Cigarettes    Quit date: 04/16/1974  . Smokeless tobacco: Never Used  . Alcohol Use: No  . Drug Use: No  . Sexual Activity: Not on file   Other Topics Concern  . Not on file   Social History Narrative  . No narrative on file     History reviewed. No pertinent family history.   Physical Exam: Filed Vitals:   08/26/13 1600 08/26/13 1615 08/26/13 1630 08/26/13 1645  BP: 142/44 131/42 112/37   Pulse: 61 55 63   Temp:    98.1 F (36.7 C)  TempSrc:      Resp: 27 8 16    Height:      Weight:       SpO2: 95% 96% 95%     GEN- The patient is chronically ill appearing, sedated Head- normocephalic, atraumatic Eyes-  Sclera clear, conjunctiva pink Ears- hearing intact Oropharynx- clear Neck- supple  Lungs- Clear to ausculation bilaterally, normal work of breathing Heart- Regular rate and rhythm, 2/6 SEM LUSB GI- soft, NT, ND, + BS Extremities- no clubbing, cyanosis, or edema MS-diffuse muscle atrophy Skin- no rash or lesion Psych- sedated Neuro-sedated  Labs:   Lab Results  Component Value Date   WBC 9.8 08/22/2013   HGB 9.2* 08/26/2013   HCT 27.0* 08/26/2013   MCV 93.5 08/22/2013   PLT 244 08/22/2013    Recent Labs Lab 08/23/13 0334  08/25/13 0433 08/26/13 1255  NA 140  < > 143 141  K 5.6*  < > 3.4* 2.9*  CL 102  < > 104  --   CO2 23  < > 25  --   BUN 27*  < > 20  --   CREATININE 1.12  < > 0.95  --   CALCIUM 8.5  < > 8.6  --   PROT 6.0  --   --   --   BILITOT 0.6  --   --   --   ALKPHOS 74  --   --   --   ALT 44  --   --   --   AST 63*  --   --   --   GLUCOSE 111*  < > 92  --   < > = values in this interval not displayed.   Radiology/Studies: Ct Angio Head W/cm &/or Wo Cm 08/22/2013   CLINICAL DATA:  73 year old male with history of thoracic aneurysm status post repair. Progressive weight gain, swelling and shortness of Breath. Recently thought to have sinus infection, treated with antibiotics and steroids. Symptoms increasing. Initial encounter.  EXAM: CT ANGIOGRAPHY HEAD AND NECK  TECHNIQUE: Multidetector CT imaging of the head and neck was performed using the standard protocol during bolus administration of intravenous contrast. Multiplanar CT image reconstructions and MIPs were obtained to evaluate the vascular anatomy. Carotid stenosis measurements (when applicable) are obtained utilizing NASCET criteria, using the distal internal carotid diameter as the denominator.  CONTRAST:  58mL OMNIPAQUE IOHEXOL 350 MG/ML SOLN  COMPARISON:  Chest abdomen and pelvis CTA from the  same day reported separately.  FINDINGS: CTA HEAD FINDINGS  Calvarium intact. Negative scalp soft tissues. Cerebral volume is within normal limits for age. No midline shift, ventriculomegaly, mass effect, evidence of mass lesion, intracranial hemorrhage or evidence of cortically based acute infarction. Gray-white matter differentiation is within normal limits throughout the brain. No abnormal enhancement identified.  VASCULAR FINDINGS:  Major intracranial venous structures are enhancing.  Dominant distal left vertebral artery is patent without stenosis despite some calcified plaque. Retrograde supply to the non dominant distal right vertebral artery. Right PICA and AICA are patent. Dominant left AICA. The basilar artery is patent without stenosis. SCA and PCA origins are within normal limits. Normal left posterior communicating artery, the right is diminutive or absent. Bilateral PCA branches are within normal limits.  Some cavernous sinus venous contrast artifact is present. Both ICA siphons are patent. Ophthalmic and posterior communicating arteries are within normal limits. Normal carotid termini. Normal MCA and ACA origins. Anterior communicating artery and bilateral ACA branches are within normal limits. Bilateral MCA branches are within normal limits.  Review of the MIP images confirms the above findings.  CTA NECK FINDINGS  Chest findings are reported separately (please see that report). Negative thyroid, larynx, pharynx, parapharyngeal spaces, retropharyngeal space. Extensive dental hardware artifact. Negative visualized sublingual space, submandibular glands  and submandibular space. Negative parotid glands. No acute orbits soft tissue findings.  No cervical lymphadenopathy. Mucous retention cyst left maxillary sinus. Other Visualized paranasal sinuses and mastoids are clear. No acute osseous abnormality identified. Degenerative changes in the cervical spine.  VASCULAR FINDINGS:  SEE CHEST CTA FINDINGS FROM  TODAY REPORTED SEPARATELY.  Both internal jugular veins are patent. The left subclavian and innominate vein are patent. Negative non contrast appearance of the right subclavian. The right innominate vein is patent.  Great vessel origins are patent, dolichoectatic. Widespread mild soft and calcified circumferential plaque in the great vessels.  Patent, mildly dolichoectatic right CCA. Patent right carotid bifurcation with no right ICA stenosis despite calcified plaque. Tortuous, mildly dolichoectatic cervical right ICA.  No proximal right subclavian artery stenosis, with some dolichoectasia. Non dominant right vertebral artery is occluded at its origin. Distal right vertebral artery flow is reconstituted briefly at the C2-C3 level, but then re- occludes below the skullbase. Intracranial findings detailed above.  Patent, the dolichoectatic left CCA. Calcified plaque at the left carotid bifurcation which is significantly affected by dental streak artifact. Loss of enhancement in the left ICA origin and bulb felt to be artifactual (see series 12, image 172). Tortuous/dolichoectatic but otherwise negative cervical left ICA be on that level.  No proximal left subclavian artery stenosis. Dominant left vertebral artery origin widely patent. Tortuous proximal left vertebral artery. Dominant left vertebral artery tortuous and patent to the skullbase.  Review of the MIP images confirms the above findings.  IMPRESSION: 1. Abnormal thoracic aorta, see chest CTA from today reported separately. 2. Patent, dolichoectatic great vessels without arterial dissection in the neck. 3. Occluded non dominant right vertebral artery. Patent, dominant left vertebral artery. 4. Suspect artifactual decreased enhancement of the left ICA origin, related to severe dental hardware artifact. Absence of any proximal left ICA stenosis could be confirmed with either carotid Doppler or neck MRA if necessary. 5. Distal right vertebral artery supplied in a  retrograde fashion from the left. Intracranial atherosclerosis, but no intracranial stenosis and otherwise negative intracranial CTA. 6.  No acute intracranial abnormality.   Electronically Signed   By: Augusto GambleLee  Hall M.D.   On: 08/22/2013 12:26   Dg Chest 2 View 08/22/2013   CLINICAL DATA:  Shortness of breath.  History of aortic dissection.  EXAM: CHEST  2 VIEW  COMPARISON:  CT chest 05/04/2013. Single view of the chest 05/12/2013 and 11 16/2014. PA and lateral chest 05/09/2013.  FINDINGS: There is cardiomegaly and pulmonary edema with small bilateral pleural effusions. The patient is status post aortic valve replacement. Marked dilatation of the aortic arch appears increased since the prior study.  IMPRESSION: Marked increase in size of the aortic arch since the prior exam worrisome for worsened dissection/aneurysm and possibly hemorrhage. The patient in congestive failure with marked cardiomegaly. CT angiogram of the aorta is recommended.  Critical Value/emergent results were called by telephone at the time of interpretation on 08/22/2013 at 10:26 AM to Dr. Karma GanjaLINKER, who verbally acknowledged these results.   Electronically Signed   By: Drusilla Kannerhomas  Dalessio M.D.   On: 08/22/2013 10:28   EKG:  08/23/13 (Most recent) reveals junctional rhythm with prolonged qt interval  Echo is reviewed  A/P The patient is presently recovering from vascular surgery performed earlier today.   He was observed to have nonsustained polymorphic VT (strips not presently available) which self terminated.  He has no prior h/o ventricular arrhythmias.  His presenting EKG revealed junctional rhythm with prolonged qt for  which his amiodarone, diltiazem, and metoprolol were discontinued.  His present VT event occurred in the midst of a surgical procedure which included manipulation of the aorta, carotids, and subclavian vessels.  His K was 2.9 at the time. I think that given his preserved EF and provoked VT, that I would recommend conservative  management presently.  We will observe on telemetry in the SICU.  Repeat ekg at this time.  Keep K >3.9 and Mg >1.9.  I would not resume amiodarone presently unless additional arrhythmias occur. No indication for further EP workup or management at this time.   Dr Mayford Knife to follow-up on patient in the am. Electrophysiology team to see as needed while here. Please call with questions.

## 2013-08-26 NOTE — Op Note (Addendum)
Patient name: [name] [name] MRN: [id number] DOB: May 12, 1941 Sex: male  08/22/2013 - 08/26/2013 Pre-operative Diagnosis: Type III a aortic dissection Post-operative diagnosis:  Same Surgeon:  Eldridge Abrahams Co-surgeon:  Ceasar Mons Procedure:    #1: Left carotid subclavian bypass with 8 mm dacryon   #2: Ligation of proximal left subclavian artery   #3: Ultrasound-guided access, left common femoral artery   #4: Catheter in aorta x2   #5: Aortic arch angiogram   #6: Endovascular repair of thoracic aortic aneurysm   #7: Distal extension x1 Anesthesia:  Gen. Blood Loss:  See anesthesia record Specimens:  None  Findings:  Complete exclusion Devices used: Proximal piece is a Gore CTAG 20 x 40.  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  Late last year he developed an acute type III a dissection.  He was seen several months ago and found to have significant enlargement of his aneurysm.  He was admitted for heart failure symptoms.  These have resolved.  There has been a candidate significant increase in his aneurysm size.  He is now ready for repair.  Procedure:  The patient was identified in the holding area and taken to [place] [facility]  The patient was then placed supine on the table. general anesthesia was administered.  The patient was prepped and draped in the usual sterile fashion.  A time out was called and antibiotics were administered.  I initially began by making a supraclavicular incision beginning at the sternal notch extending 7 cm lateral.  The original plan was to perform a carotid subclavian transposition.  The platysma muscle was divided with cautery.  I identified the sternal and clavicular head of the sternocleidomastoid and developed a plane between the 2.  I divided approximately 80% of the clavicular head.  The omohyoid muscle was divided.  I then dissected out the internal  jugular vein.  The carotid artery was then identified and circumferentially exposed.  The vagus nerve was identified on the posterior medial border and protected.  I then developed a plane between the left common carotid and internal jugular vein.  The thoracic duct was visualized and ligated between 3-0 silk ties.  The vertebral vein was then identified.  Initially, what turned out to be internal mammary artery was identified.  We felt this was the vertebral artery and because of its depth in the wound did not feel isolating the subclavian artery at this location was appropriate.  Therefore, the decision was made to get the subclavian artery out more distally.  I then began dissecting on the lateral side of the internal jugular vein.  The scalene fat pad was reflected laterally, ligating all lymphatic structures.  The anterior scalene muscle was then identified.  The phrenic nerve was visualized coursing on the medial border.  This was mobilized and protected.  Next, the anterior scalene muscle was divided with cautery, exposing the subclavian artery at this level.  It was circumferentially dissected free.  I then mobilized this more proximally and realized that what we originally thought was the vertebral artery was the mammary artery.  I continue with further mobilization of the subclavian artery.  I identified the vertebral artery and then circumferentially exposed the subclavian artery proximal to this.  Because of the posterior and proximal  location of the vertebral artery, I did not think reimplantation would be a good idea and  felt bypass was a better anatomic option.  The patient was given a therapeutic bolus of heparin.  After this circulated, the distal subclavian artery was occluded with vascular clamps.  A #11 blade was used to make an arteriotomy which was extended longitudinally with Potts scissors.  An 8 mm dacryon graft was then brought onto the table and fashioned to fit the arteriotomy.  A  running anastomosis was created with 5-0 Prolene.  Prior to completion of the anastomosis, the appropriate flushing maneuvers were performed.  The anastomosis was completed and the clamp was released.  One repair stitch was required at the heel of the graft.  The graft was then brought posterior to the internal jugular vein.  A site was selected on the posterior lateral side of the common carotid artery.  The common carotid artery was then occluded with vascular clamps.  A #11 blade was used to make an arteriotomy.  A #5 punch was used to open the arteriotomy.  The dacryon graft was then cut the appropriate length and then beveled.  A running anastomosis was created with 5-0 Prolene.  Prior to completion, the appropriate flushing maneuvers were performed and the anastomosis was completed.  Sequential declamping was then performed with the distal common carotid clamp being removed last.  Hand-held Doppler was used to evaluate the signals in the common carotid and subclavian artery.  These were all appropriate.  The wound was then packed and attention was turned towards the groin.  Ultrasound was used to evaluate the left common femoral artery.  This was widely patent without significant calcification.  Digital ultrasound image was acquired.  A #11 blade was used to make a skin nick.  The left common femoral artery was then cannulated under ultrasound guidance with an 18-gauge needle.  An 035 wire was then advanced but met resistance in the proximal iliac.  A Kumpe catheter was used to navigate the wire into the aorta.  The subcutaneous tract was dilated with an 8 [place] dilator.  Provide devices were deployed at the 11:00 and 1:00 position for pre-closure.  An 8 French sheath was then placed.  The patient was then re-dosed with heparin.  A pigtail catheter was advanced into the descending aorta.  A Lunderquist double curve wire was then placed into the ascending aorta.  This was followed by a 24 [place] sheath.  The  main body device was prepared on the back table.  This was a Gore  CTAG 40 x 20.  It was then advanced over the wire into the descending thoracic aorta.  A second puncture of the sheath was then performed and pigtail  catheter was advanced over an 035 Bentson wire into the ascending aorta.  The catheter was connected to the injector after de-airing, a aortic arch angiogram was performed.  This located the origin of the previously placed bypass arising from the ascending aorta.  The proximal piece was then advanced around to the aortic arch, and then deployed landing at the level of the innominate and left carotid bypass.  The catheter was rewired.  The distal extension was then prepared, this was a Gore  CTAG 45 x 15.  It was then advanced through the sheath.  The pigtail was inserted to the proximal piece and a contrast injection was performed locating the celiac artery origin.  Distal extension was then advanced through the proximal piece with approximately 5 cm of overlap.  This was then deployed landing about 6 to 7 cm proximal to  the celiac artery.  Next a trilobed balloon was used to mold the distal portion first followed by the proximal portion, followed by the device overlap.  A completion arteriogram was then performed.  This showed successful exclusion of the aneurysm sac with no evidence of a endoleak.  There was preserve patency of the innominate and carotid bypass.  The subclavian carotid bypass was widely patent, as was the celiac artery.  Next a soft wire was inserted and the catheter was removed.  The sheath was withdrawn and the arteriotomy closed with the prior deployed proglide devices.  The skin was closed with a 4-0 Vicryl.  Next attention was turned towards the neck.  Hemostasis was achieved.  I did leave a 15 Blake drain.  The platysma muscle was closed with 3-0 Vicryl the skin was closed with 4-0 Vicryl.  I did reapproximate a portion of the clavicular head of the sternocleidomastoid.   Dermabond was applied.  The patient had Doppler signals in his feet similar to preop.  The patient had several runs of V Fib during closure.  He remained hemodynamically stable.  He did not require shocking.  His electrolytes were corrected and he was stabilized medically.  He was extubated and taken to the recovery room in stable condition   Disposition:  To PACU in stable condition.   Theotis Burrow, M.D. [facility] Office: [phone] Pager:  [phone]

## 2013-08-26 NOTE — Transfer of Care (Signed)
Immediate Anesthesia Transfer of Care Note  Patient: [name] [name]  Procedure(s) Performed: Procedure(s): BYPASS GRAFT CAROTID-SUBCLAVIAN (Left)  THORACIC STENT GRAFT INSERTION (N/A)  Patient Location: PACU  Anesthesia Type:General  Level of Consciousness: oriented, sedated, patient cooperative and responds to stimulation  Airway & Oxygen Therapy: Patient Spontanous Breathing and Patient connected to face mask oxygen  Post-op Assessment: Report given to PACU RN, Post -op Vital signs reviewed and stable, Patient moving all extremities and Patient moving all extremities X 4  Post vital signs: Reviewed and stable  Complications: No apparent anesthesia complications

## 2013-08-26 NOTE — Consult Note (Addendum)
Consult Note  Patient name: [name] [name] MRN: [id number] DOB: 07/29/1940 Sex: male  Consulting Physician:  Dr. Tyrone SageGerhardt  Reason for Consult:  Chief Complaint  Patient presents with  . Shortness of Breath    HISTORY OF PRESENT ILLNESS: This is a 73 year old gentleman with a history of a descending aortic aneurysm repair.  This was in 2010.  He underwent aortic valve replacement with tissue valve as well as replacement of the descending aorta.  He had a bypass to the innominate and left carotid artery.  He was seen in the office in January for followup of a type III aortic dissection which was found at November 2014.  He was scheduled to come back for repeat followup however he presented to the emergency department on 36 with acute on chronic heart failure  The patient has a history of atrial fibrillation and is on chronic anticoagulation.  He also has a history of coronary artery disease, status post stenting in 2012.  He is medically managed for hypertension with an ACE inhibitor.  He is medically managed for cholesterol with a statin.  He has a history of a deep vein thrombosis in the past  Past Medical History  Diagnosis Date  . Hyperlipidemia   . DVT (deep venous thrombosis)     post knee surgery  . Tobacco abuse   . HTN (hypertension)   . Descending aortic aneurysm   . Aortic valve insufficiency   . Thoracic aneurysm   . S/P AVR (aortic valve replacement)   . CAD (coronary artery disease)   . Atrial flutter     Past Surgical History  Procedure Laterality Date  . Right groin lymphocele  [id number]  . Aortic valve replacement  [id number]  . Replacement total knee      bilateral    History   Social History  . Marital Status: Married    Spouse Name: N/A    Number of Children: N/A  . Years of Education: N/A   Occupational History  . Not on file.   Social History Main Topics  . Smoking status: Former Smoker    Types: Cigarettes    Quit date: 04/16/1974    . Smokeless tobacco: Never Used  . Alcohol Use: No  . Drug Use: No  . Sexual Activity: Not on file   Other Topics Concern  . Not on file   Social History Narrative  . No narrative on file    History reviewed. No pertinent family history.  Allergies as of 08/22/2013 - Review Complete 08/22/2013  Allergen Reaction Noted  . Lortab [hydrocodone-acetaminophen] Hives 05/30/2012  . Morphine and related Hives 05/18/2011    No current facility-administered medications on file prior to encounter.   Current Outpatient Prescriptions on File Prior to Encounter  Medication Sig Dispense Refill  . amiodarone (PACERONE) 400 MG tablet Take 1 tablet (400 mg total) by mouth daily.  60 tablet  3  . apixaban (ELIQUIS) 5 MG TABS tablet Take 5 mg by mouth 2 (two) times daily.      Marland Kitchen. aspirin EC 81 MG tablet Take 81 mg by mouth daily.        Marland Kitchen. atorvastatin (LIPITOR) 10 MG tablet Take 1 tablet (10 mg total) by mouth daily.  90 tablet  3  . diltiazem (CARDIZEM CD) 240 MG 24 hr capsule Take 1 capsule (240 mg total) by mouth 2 (two) times daily.  60 capsule  11  . enalapril (VASOTEC)  5 MG tablet Take 5 mg by mouth daily.        . furosemide (LASIX) 40 MG tablet Take 1 tablet (40 mg total) by mouth 2 (two) times daily.  60 tablet  11  . lansoprazole (PREVACID) 30 MG capsule Take 30 mg by mouth daily.       Marland Kitchen loratadine (CLARITIN) 10 MG tablet Take 10 mg by mouth daily.        . metoprolol (LOPRESSOR) 100 MG tablet Take 1 tablet (100 mg total) by mouth 2 (two) times daily.  60 tablet  7  . nitroGLYCERIN (NITROSTAT) 0.4 MG SL tablet Place 0.4 mg under the tongue every 5 (five) minutes as needed.        . potassium chloride SA (K-DUR,KLOR-CON) 20 MEQ tablet Take 2 tablets (40 mEq total) by mouth daily.  60 tablet  11     REVIEW OF SYSTEMS: Cardiovascular: No chest pain, chest pressure, palpitations, orthopnea, or dyspnea on exertion.  Positive for lower extremity swelling Pulmonary: No productive cough,  asthma or wheezing. Neurologic: No weakness, paresthesias, aphasia, or amaurosis. No dizziness. Hematologic: No bleeding problems or clotting disorders. Musculoskeletal: No joint pain or joint swelling. Gastrointestinal: No blood in stool or hematemesis Genitourinary: No dysuria or hematuria. Psychiatric:: No history of major depression. Integumentary: No rashes or ulcers. Constitutional: No fever or chills.  PHYSICAL EXAMINATION: General: The patient appears their stated age.  Vital signs are BP 189/54  Pulse 77  Temp(Src) 98.4 F (36.9 C) (Oral)  Resp 16  Ht 5\' 10"  (1.778 m)  Wt 176 lb 5.9 oz (80 kg)  BMI 25.31 kg/m2  SpO2 98% Pulmonary: Respirations are non-labored HEENT:  No gross abnormalities Abdomen: Soft and non-tender  Musculoskeletal: There are no major deformities.   Neurologic: No focal weakness or paresthesias are detected, Skin: There are no ulcer or rashes noted. Psychiatric: The patient has normal affect. Cardiovascular: There is a regular rate and rhythm without significant murmur appreciated.  Lower extremity swelling.  Cannot palpate pulses  Diagnostic Studies: I have reviewed his most recent CT angiogram of the chest abdomen and pelvis as well as the neck.  There is a type III a dissection with aneurysmal degeneration.  No right vertebral artery is identified.  The left vertebral artery is  dominant.   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 Tyrone Sage.  The patient will require a left subclavian to carotid transposition.  This is because his right vertebral artery is occluded at his left vertebral is his only was brought to the posterior circulation.  He will need coverage of his left subclavian artery in order to get adequate seal of his dissection.  Following the transposition, the patient will need a thoracic stent graft extending from the origin of his previous innominate bypass down to  just above the celiac artery for repair of his dissection.  I discussed this operation in detail with the patient this morning.  We discussed the risks and benefits of a carotid subclavian transposition which include the risk of lymph leak, nerve injury and bleeding.  We also discussed possibly doing the stent graft from a percutaneous approach.  All his questions were answered.     Jorge Ny, M.D. [facility] Office: [phone] Pager:  [phone]

## 2013-08-26 NOTE — Anesthesia Postprocedure Evaluation (Signed)
  Anesthesia Post-op Note  Patient: [name] [name]  Procedure(s) Performed: Procedure(s): BYPASS GRAFT CAROTID-SUBCLAVIAN (Left)  THORACIC STENT GRAFT INSERTION (N/A)  Patient Location: PACU  Anesthesia Type:General  Level of Consciousness: awake, alert  and patient cooperative  Airway and Oxygen Therapy: Patient Spontanous Breathing on Bipap  Post-op Pain: none  Post-op Assessment: Post-op Vital signs reviewed, Patient's Cardiovascular Status Stable and Respiratory Function Stable  Post-op Vital Signs: Reviewed and stable  Complications: No apparent anesthesia complications

## 2013-08-26 NOTE — Anesthesia Preprocedure Evaluation (Signed)
Anesthesia Evaluation  Patient identified by MRN, date of birth, ID band Patient awake    Reviewed: Allergy & Precautions, H&P , NPO status , Patient's Chart, lab work & pertinent test results, reviewed documented beta blocker date and time   History of Anesthesia Complications Negative for: history of anesthetic complications  Airway Mallampati: II TM Distance: >3 FB Neck ROM: Full    Dental  (+) Teeth Intact   Pulmonary neg shortness of breath, neg sleep apnea, neg COPDformer smoker,  breath sounds clear to auscultation        Cardiovascular hypertension, Pt. on medications and Pt. on home beta blockers + CAD, + Cardiac Stents, + Peripheral Vascular Disease and +CHF Rhythm:Regular Rate:Normal     Neuro/Psych Normal LE motor function bilaterally negative neurological ROS  negative psych ROS   GI/Hepatic negative GI ROS, Neg liver ROS,   Endo/Other  negative endocrine ROS  Renal/GU negative Renal ROS     Musculoskeletal negative musculoskeletal ROS (+)   Abdominal   Peds  Hematology negative hematology ROS (+) anemia ,   Anesthesia Other Findings   Reproductive/Obstetrics                           Anesthesia Physical Anesthesia Plan  ASA: III  Anesthesia Plan: General   Post-op Pain Management:    Induction: Intravenous  Airway Management Planned: Oral ETT  Additional Equipment: None, Arterial line, CVP, TEE and Ultrasound Guidance Line Placement  Intra-op Plan:   Post-operative Plan: Extubation in OR  Informed Consent: I have reviewed the patients History and Physical, chart, labs and discussed the procedure including the risks, benefits and alternatives for the proposed anesthesia with the patient or authorized representative who has indicated his/her understanding and acceptance.   Dental advisory given  Plan Discussed with: CRNA and Surgeon  Anesthesia Plan Comments:          Anesthesia Quick Evaluation

## 2013-08-27 LAB — BASIC METABOLIC PANEL
BUN: 26 mg/dL — ABNORMAL HIGH (ref 6–23)
CO2: 26 mEq/L (ref 19–32)
Calcium: 8 mg/dL — ABNORMAL LOW (ref 8.4–10.5)
Chloride: 103 mEq/L (ref 96–112)
Creatinine, Ser: 1.07 mg/dL (ref 0.50–1.35)
GFR calc Af Amer: 77 mL/min — ABNORMAL LOW (ref >90)
GFR calc non Af Amer: 67 mL/min — ABNORMAL LOW (ref >90)
Glucose, Bld: 109 mg/dL — ABNORMAL HIGH (ref 70–99)
Potassium: 3.9 mEq/L (ref 3.7–5.3)
Sodium: 141 mEq/L (ref 137–147)

## 2013-08-27 LAB — CBC
HCT: 34.7 % — ABNORMAL LOW (ref 39.0–52.0)
Hemoglobin: 10.4 g/dL — ABNORMAL LOW (ref 13.0–17.0)
MCH: 28.2 pg (ref 26.0–34.0)
MCHC: 30 g/dL (ref 30.0–36.0)
MCV: 94 fL (ref 78.0–100.0)
Platelets: 240 10*3/uL (ref 150–400)
RBC: 3.69 MIL/uL — ABNORMAL LOW (ref 4.22–5.81)
RDW: 18.2 % — ABNORMAL HIGH (ref 11.5–15.5)
WBC: 12.1 10*3/uL — ABNORMAL HIGH (ref 4.0–10.5)

## 2013-08-27 LAB — PROTIME-INR
INR: 1.22 (ref 0.00–1.49)
Prothrombin Time: 15.1 seconds (ref 11.6–15.2)

## 2013-08-27 LAB — HEMOGLOBIN A1C
Hgb A1c MFr Bld: 6.1 % — ABNORMAL HIGH (ref <5.7)
Mean Plasma Glucose: 128 mg/dL — ABNORMAL HIGH (ref <117)

## 2013-08-27 MED ORDER — ENOXAPARIN SODIUM 30 MG/0.3ML ~~LOC~~ SOLN
30.0000 mg | SUBCUTANEOUS | Status: DC
  Administered 2013-08-28 – 2013-08-29 (×2): 30 mg via SUBCUTANEOUS
  Filled 2013-08-27 (×3): qty 0.3

## 2013-08-27 NOTE — Progress Notes (Signed)
UR Completed.  Hayly Litsey Jane [phone] 08/27/2013  

## 2013-08-27 NOTE — Progress Notes (Signed)
SUBJECTIVE:  No complaints  OBJECTIVE:   Vitals:   Filed Vitals:   08/27/13 1100 08/27/13 1125 08/27/13 1200 08/27/13 1215  BP: 109/46   114/39  Pulse: 77  65   Temp:  97.9 F (36.6 C)    TempSrc:  Oral    Resp: 30  20   Height:      Weight:      SpO2: 97%  96%    I&O's:   Intake/Output Summary (Last 24 hours) at 08/27/13 1259 Last data filed at 08/27/13 1215  Gross per 24 hour  Intake 2437.5 ml  Output   1000 ml  Net 1437.5 ml   TELEMETRY: Reviewed telemetry pt in NSR:     PHYSICAL EXAM General: Well developed, well nourished, in no acute distress Head: Eyes PERRLA, No xanthomas.   Normal cephalic and atramatic  Lungs:   Clear bilaterally to auscultation anteriorly. Heart:   HRRR S1 S2 Pulses are 2+ & equal. Abdomen: Bowel sounds are positive, abdomen soft and non-tender without masses  Extremities:   No clubbing, cyanosis or edema.  DP +1 Neuro: Alert and oriented X 3. Psych:  Good affect, responds appropriately   LABS: Basic Metabolic Panel:  Recent Labs  16/10/96 0433  08/26/13 1855 08/27/13 0440  NA 143  < > 140 141  K 3.4*  < > 3.7 3.9  CL 104  --   --  103  CO2 25  --   --  26  GLUCOSE 92  --  110* 109*  BUN 20  --   --  26*  CREATININE 0.95  --   --  1.07  CALCIUM 8.6  --   --  8.0*  < > = values in this interval not displayed. Liver Function Tests: No results found for this basename: AST, ALT, ALKPHOS, BILITOT, PROT, ALBUMIN,  in the last 72 hours No results found for this basename: LIPASE, AMYLASE,  in the last 72 hours CBC:  Recent Labs  08/26/13 1855 08/27/13 0440  WBC  --  12.1*  HGB 11.9* 10.4*  HCT 35.0* 34.7*  MCV  --  94.0  PLT  --  240   Cardiac Enzymes: No results found for this basename: CKTOTAL, CKMB, CKMBINDEX, TROPONINI,  in the last 72 hours BNP: No components found with this basename: POCBNP,  D-Dimer: No results found for this basename: DDIMER,  in the last 72 hours Hemoglobin A1C: No results found for this  basename: HGBA1C,  in the last 72 hours Fasting Lipid Panel: No results found for this basename: CHOL, HDL, LDLCALC, TRIG, CHOLHDL, LDLDIRECT,  in the last 72 hours Thyroid Function Tests: No results found for this basename: TSH, T4TOTAL, FREET3, T3FREE, THYROIDAB,  in the last 72 hours Anemia Panel: No results found for this basename: VITAMINB12, FOLATE, FERRITIN, TIBC, IRON, RETICCTPCT,  in the last 72 hours Coag Panel:   Lab Results  Component Value Date   INR 1.22 08/27/2013   INR 1.59* 08/22/2013   INR 1.15 05/04/2013    RADIOLOGY: Ct Angio Head W/cm &/or Wo Cm  08/22/2013   CLINICAL DATA:  73 year old male with history of thoracic aneurysm status post repair. Progressive weight gain, swelling and shortness of Breath. Recently thought to have sinus infection, treated with antibiotics and steroids. Symptoms increasing. Initial encounter.  EXAM: CT ANGIOGRAPHY HEAD AND NECK  TECHNIQUE: Multidetector CT imaging of the head and neck was performed using the standard protocol during bolus administration of intravenous contrast. Multiplanar CT image  reconstructions and MIPs were obtained to evaluate the vascular anatomy. Carotid stenosis measurements (when applicable) are obtained utilizing NASCET criteria, using the distal internal carotid diameter as the denominator.  CONTRAST:  50mL OMNIPAQUE IOHEXOL 350 MG/ML SOLN  COMPARISON:  Chest abdomen and pelvis CTA from the same day reported separately.  FINDINGS: CTA HEAD FINDINGS  Calvarium intact. Negative scalp soft tissues. Cerebral volume is within normal limits for age. No midline shift, ventriculomegaly, mass effect, evidence of mass lesion, intracranial hemorrhage or evidence of cortically based acute infarction. Gray-white matter differentiation is within normal limits throughout the brain. No abnormal enhancement identified.  VASCULAR FINDINGS:  Major intracranial venous structures are enhancing.  Dominant distal left vertebral artery is patent  without stenosis despite some calcified plaque. Retrograde supply to the non dominant distal right vertebral artery. Right PICA and AICA are patent. Dominant left AICA. The basilar artery is patent without stenosis. SCA and PCA origins are within normal limits. Normal left posterior communicating artery, the right is diminutive or absent. Bilateral PCA branches are within normal limits.  Some cavernous sinus venous contrast artifact is present. Both ICA siphons are patent. Ophthalmic and posterior communicating arteries are within normal limits. Normal carotid termini. Normal MCA and ACA origins. Anterior communicating artery and bilateral ACA branches are within normal limits. Bilateral MCA branches are within normal limits.  Review of the MIP images confirms the above findings.  CTA NECK FINDINGS  Chest findings are reported separately (please see that report). Negative thyroid, larynx, pharynx, parapharyngeal spaces, retropharyngeal space. Extensive dental hardware artifact. Negative visualized sublingual space, submandibular glands and submandibular space. Negative parotid glands. No acute orbits soft tissue findings.  No cervical lymphadenopathy. Mucous retention cyst left maxillary sinus. Other Visualized paranasal sinuses and mastoids are clear. No acute osseous abnormality identified. Degenerative changes in the cervical spine.  VASCULAR FINDINGS:  SEE CHEST CTA FINDINGS FROM TODAY REPORTED SEPARATELY.  Both internal jugular veins are patent. The left subclavian and innominate vein are patent. Negative non contrast appearance of the right subclavian. The right innominate vein is patent.  Great vessel origins are patent, dolichoectatic. Widespread mild soft and calcified circumferential plaque in the great vessels.  Patent, mildly dolichoectatic right CCA. Patent right carotid bifurcation with no right ICA stenosis despite calcified plaque. Tortuous, mildly dolichoectatic cervical right ICA.  No proximal  right subclavian artery stenosis, with some dolichoectasia. Non dominant right vertebral artery is occluded at its origin. Distal right vertebral artery flow is reconstituted briefly at the C2-C3 level, but then re- occludes below the skullbase. Intracranial findings detailed above.  Patent, the dolichoectatic left CCA. Calcified plaque at the left carotid bifurcation which is significantly affected by dental streak artifact. Loss of enhancement in the left ICA origin and bulb felt to be artifactual (see series 12, image 172). Tortuous/dolichoectatic but otherwise negative cervical left ICA be on that level.  No proximal left subclavian artery stenosis. Dominant left vertebral artery origin widely patent. Tortuous proximal left vertebral artery. Dominant left vertebral artery tortuous and patent to the skullbase.  Review of the MIP images confirms the above findings.  IMPRESSION: 1. Abnormal thoracic aorta, see chest CTA from today reported separately. 2. Patent, dolichoectatic great vessels without arterial dissection in the neck. 3. Occluded non dominant right vertebral artery. Patent, dominant left vertebral artery. 4. Suspect artifactual decreased enhancement of the left ICA origin, related to severe dental hardware artifact. Absence of any proximal left ICA stenosis could be confirmed with either carotid Doppler or neck MRA  if necessary. 5. Distal right vertebral artery supplied in a retrograde fashion from the left. Intracranial atherosclerosis, but no intracranial stenosis and otherwise negative intracranial CTA. 6.  No acute intracranial abnormality.   Electronically Signed   By: Augusto Gamble M.D.   On: 08/22/2013 12:26   Dg Chest 2 View  08/22/2013   CLINICAL DATA:  Shortness of breath.  History of aortic dissection.  EXAM: CHEST  2 VIEW  COMPARISON:  CT chest 05/04/2013. Single view of the chest 05/12/2013 and 11 16/2014. PA and lateral chest 05/09/2013.  FINDINGS: There is cardiomegaly and pulmonary edema  with small bilateral pleural effusions. The patient is status post aortic valve replacement. Marked dilatation of the aortic arch appears increased since the prior study.  IMPRESSION: Marked increase in size of the aortic arch since the prior exam worrisome for worsened dissection/aneurysm and possibly hemorrhage. The patient in congestive failure with marked cardiomegaly. CT angiogram of the aorta is recommended.  Critical Value/emergent results were called by telephone at the time of interpretation on 08/22/2013 at 10:26 AM to Dr. Karma Ganja, who verbally acknowledged these results.   Electronically Signed   By: Drusilla Kanner M.D.   On: 08/22/2013 10:28   Ct Angio Neck W/cm &/or Wo/cm  08/22/2013   CLINICAL DATA:  73 year old male with history of thoracic aneurysm status post repair. Progressive weight gain, swelling and shortness of Breath. Recently thought to have sinus infection, treated with antibiotics and steroids. Symptoms increasing. Initial encounter.  EXAM: CT ANGIOGRAPHY HEAD AND NECK  TECHNIQUE: Multidetector CT imaging of the head and neck was performed using the standard protocol during bolus administration of intravenous contrast. Multiplanar CT image reconstructions and MIPs were obtained to evaluate the vascular anatomy. Carotid stenosis measurements (when applicable) are obtained utilizing NASCET criteria, using the distal internal carotid diameter as the denominator.  CONTRAST:  50mL OMNIPAQUE IOHEXOL 350 MG/ML SOLN  COMPARISON:  Chest abdomen and pelvis CTA from the same day reported separately.  FINDINGS: CTA HEAD FINDINGS  Calvarium intact. Negative scalp soft tissues. Cerebral volume is within normal limits for age. No midline shift, ventriculomegaly, mass effect, evidence of mass lesion, intracranial hemorrhage or evidence of cortically based acute infarction. Gray-white matter differentiation is within normal limits throughout the brain. No abnormal enhancement identified.  VASCULAR  FINDINGS:  Major intracranial venous structures are enhancing.  Dominant distal left vertebral artery is patent without stenosis despite some calcified plaque. Retrograde supply to the non dominant distal right vertebral artery. Right PICA and AICA are patent. Dominant left AICA. The basilar artery is patent without stenosis. SCA and PCA origins are within normal limits. Normal left posterior communicating artery, the right is diminutive or absent. Bilateral PCA branches are within normal limits.  Some cavernous sinus venous contrast artifact is present. Both ICA siphons are patent. Ophthalmic and posterior communicating arteries are within normal limits. Normal carotid termini. Normal MCA and ACA origins. Anterior communicating artery and bilateral ACA branches are within normal limits. Bilateral MCA branches are within normal limits.  Review of the MIP images confirms the above findings.  CTA NECK FINDINGS  Chest findings are reported separately (please see that report). Negative thyroid, larynx, pharynx, parapharyngeal spaces, retropharyngeal space. Extensive dental hardware artifact. Negative visualized sublingual space, submandibular glands and submandibular space. Negative parotid glands. No acute orbits soft tissue findings.  No cervical lymphadenopathy. Mucous retention cyst left maxillary sinus. Other Visualized paranasal sinuses and mastoids are clear. No acute osseous abnormality identified. Degenerative changes in the cervical  spine.  VASCULAR FINDINGS:  SEE CHEST CTA FINDINGS FROM TODAY REPORTED SEPARATELY.  Both internal jugular veins are patent. The left subclavian and innominate vein are patent. Negative non contrast appearance of the right subclavian. The right innominate vein is patent.  Great vessel origins are patent, dolichoectatic. Widespread mild soft and calcified circumferential plaque in the great vessels.  Patent, mildly dolichoectatic right CCA. Patent right carotid bifurcation with no  right ICA stenosis despite calcified plaque. Tortuous, mildly dolichoectatic cervical right ICA.  No proximal right subclavian artery stenosis, with some dolichoectasia. Non dominant right vertebral artery is occluded at its origin. Distal right vertebral artery flow is reconstituted briefly at the C2-C3 level, but then re- occludes below the skullbase. Intracranial findings detailed above.  Patent, the dolichoectatic left CCA. Calcified plaque at the left carotid bifurcation which is significantly affected by dental streak artifact. Loss of enhancement in the left ICA origin and bulb felt to be artifactual (see series 12, image 172). Tortuous/dolichoectatic but otherwise negative cervical left ICA be on that level.  No proximal left subclavian artery stenosis. Dominant left vertebral artery origin widely patent. Tortuous proximal left vertebral artery. Dominant left vertebral artery tortuous and patent to the skullbase.  Review of the MIP images confirms the above findings.  IMPRESSION: 1. Abnormal thoracic aorta, see chest CTA from today reported separately. 2. Patent, dolichoectatic great vessels without arterial dissection in the neck. 3. Occluded non dominant right vertebral artery. Patent, dominant left vertebral artery. 4. Suspect artifactual decreased enhancement of the left ICA origin, related to severe dental hardware artifact. Absence of any proximal left ICA stenosis could be confirmed with either carotid Doppler or neck MRA if necessary. 5. Distal right vertebral artery supplied in a retrograde fashion from the left. Intracranial atherosclerosis, but no intracranial stenosis and otherwise negative intracranial CTA. 6.  No acute intracranial abnormality.   Electronically Signed   By: Augusto Gamble M.D.   On: 08/22/2013 12:26   Ct Angio Chest W/cm &/or Wo Cm  08/22/2013   CLINICAL DATA:  Short of breath. Weight gain. Swelling. Abnormal chest radiograph. Known aortic dissection with change in the appearance  of the mediastinum compared to prior.  EXAM: CT ANGIOGRAPHY CHEST, ABDOMEN AND PELVIS  TECHNIQUE: Multidetector CT imaging through the chest, abdomen and pelvis was performed using the standard protocol during bolus administration of intravenous contrast. Multiplanar reconstructed images and MIPs were obtained and reviewed to evaluate the vascular anatomy.  CONTRAST:  75mL OMNIPAQUE IOHEXOL 350 MG/ML SOLN  COMPARISON:  [place] THORA/PARACENTESIS dated 04/13/2009; [place] THORA/PARACENTESIS dated 03/29/2009; DG ABD PORTABLE 1V dated 02/18/2009; DG CHEST 2 VIEW dated 08/22/2013; CT ANGIO CHEST AORTA W/CM & WO/CM dated 05/04/2013  FINDINGS: CTA CHEST FINDINGS  The patient has undergone aortic root replacement with aortic valve replacement. This appears satisfactory. In the interval since the prior exam, the aortic arch has continued to dilate distal to the isthmus, and the dissection now off shows a large area of communication. The aortic lumen measures 61 mm maximally. The dissection flap remains present within the lumen, thicker than on prior, with the goal opacification of the entire aorta.  There is no hemothorax. No hemopericardium. Tortuous ectatic aortic branch vessels. There is no axillary adenopathy. Small mediastinal lymph nodes are present which are probably congestive. Heart size as enlarged compared to the prior exam with new cardiomegaly since the prior CT. Small bilateral right greater than left pleural effusions are present with associated atelectasis. Scattered foci of airspace opacity are present  in the lungs, with upper lobe predominance. Although these could represent areas of infection, given the increase in the heart size, alveolar pulmonary edema is favored. Interlobular septal thickening is present at the apices, also consistent with CHF. Severe coronary artery atherosclerosis is incidentally noted. Thoracic spondylosis. Median sternotomy. No aggressive osseous lesions.  8 mm right upper lobe pulmonary nodule  is benign, stable since 2012.  Review of the MIP images confirms the above findings.  CTA ABDOMEN AND PELVIS FINDINGS  The thoracic aortic aneurysm tapers to more normal caliber in the abdomen. Just inferior to the aortic hiatus, the abdominal aorta measures 29 mm. Ectasia of the infrarenal abdominal aorta measuring 26 mm. Iliofemoral atherosclerosis is present.  Cholelithiasis. Small amount of ascites. Right renal cysts. Prostatomegaly with 54 cm transverse prostate span. Fat containing periumbilical hernia. Small bilateral inguinal hernia is. Left inguinal hernia contains a portion of the sigmoid colon. Severe colonic diverticulosis. Right inguinal hernia sac contains ascites. No aggressive osseous lesions. Lumbar spondylosis and facet arthrosis. Visceral atherosclerosis.  Review of the MIP images confirms the above findings.  IMPRESSION: 1. Cardiomegaly and mild CHF. Right-greater-than-left small bilateral pleural effusions. 2. Multifocal airspace disease in the lungs is favored to represent alveolar edema rather than multifocal pneumonia. 3. 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 Maximal luminal diameter is 62 mm on today's study, increased from 45 mm on the prior exam. The septum from the dissection appears to have torn with the aneurysmal dilation of the aorta. Significantly, no intramural or periaortic hematoma to suggest rupture. 4. Infrarenal abdominal aortic ectasia, cholelithiasis and bilateral inguinal hernias. 5. These results were called by telephone at the time of interpretation on 08/22/2013 at 12:23 PM to Dr. Devoria Albe , who verbally acknowledged these results. 6. Benign 8 mm right upper lobe pulmonary nodule.   Electronically Signed   By: Andreas Newport M.D.   On: 08/22/2013 12:24   Dg Chest Portable 1 View  08/26/2013   CLINICAL DATA Stent graft placement.  EXAM PORTABLE CHEST - 1 VIEW  COMPARISON Chest x-ray from 2 days prior   FINDINGS Cardiopericardial enlargement which appears similar to prior. Aneurysmal thoracic aorta, with new stent graft. There is a surgical drain in the left neck where there is asymmetric soft tissue density, reportedly hematoma present on exam. Right IJ sheath, tip at the level of the SVC origin. There is pulmonary edema, both interstitial and alveolar, new from prior. Left pleural effusion, at least moderate. There is likely a layering right pleural effusion.  IMPRESSION Pulmonary edema and moderate left pleural effusion. Probable layering right pleural effusion.  SIGNATURE  Electronically Signed   By: Tiburcio Pea M.D.   On: 08/26/2013 14:46   Dg Chest Port 1 View  08/24/2013   CLINICAL DATA:  CHF and 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  EXAM: PORTABLE CHEST - 1 VIEW  COMPARISON:  CT ANGIO CHEST W/CM &/OR WO/CM dated 08/22/2013; DG CHEST 2 VIEW dated 08/22/2013; CT ANGIO CHEST AORTA W/CM & WO/CM dated 05/04/2013; DG CHEST 1V PORT dated 05/12/2013  FINDINGS: Stable prominence at aortic contour along the left mediastinum corresponding to the proximal descending thoracic aorta. There is stable cardiomegaly. CHF appears improved since the prior chest x-ray with no significant residual edema. Pleural effusions have resolved.  IMPRESSION: Resolution of CHF and pleural effusions. 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  Electronically Signed   By: Irish Lack M.D.   On: 08/24/2013 08:34   Ct Angio Abd/pel W/ And/or W/o  08/22/2013   CLINICAL DATA:  Short of breath. Weight gain. Swelling. Abnormal chest radiograph. Known aortic dissection with change in the appearance of the mediastinum compared to prior.  EXAM: CT ANGIOGRAPHY CHEST, ABDOMEN AND PELVIS  TECHNIQUE: Multidetector CT imaging through the chest, abdomen and pelvis was performed using the standard protocol during bolus administration of  intravenous contrast. Multiplanar reconstructed images and MIPs were obtained and reviewed to evaluate the vascular anatomy.  CONTRAST:  35mL OMNIPAQUE IOHEXOL 350 MG/ML SOLN  COMPARISON:  [place] THORA/PARACENTESIS dated 04/13/2009; [place] THORA/PARACENTESIS dated 03/29/2009; DG ABD PORTABLE 1V dated 02/18/2009; DG CHEST 2 VIEW dated 08/22/2013; CT ANGIO CHEST AORTA W/CM & WO/CM dated 05/04/2013  FINDINGS: CTA CHEST FINDINGS  The patient has undergone aortic root replacement with aortic valve replacement. This appears satisfactory. In the interval since the prior exam, the aortic arch has continued to dilate distal to the isthmus, and the dissection now off shows a large area of communication. The aortic lumen measures 61 mm maximally. The dissection flap remains present within the lumen, thicker than on prior, with the goal opacification of the entire aorta.  There is no hemothorax. No hemopericardium. Tortuous ectatic aortic branch vessels. There is no axillary adenopathy. Small mediastinal lymph nodes are present which are probably congestive. Heart size as enlarged compared to the prior exam with new cardiomegaly since the prior CT. Small bilateral right greater than left pleural effusions are present with associated atelectasis. Scattered foci of airspace opacity are present in the lungs, with upper lobe predominance. Although these could represent areas of infection, given the increase in the heart size, alveolar pulmonary edema is favored. Interlobular septal thickening is present at the apices, also consistent with CHF. Severe coronary artery atherosclerosis is incidentally noted. Thoracic spondylosis. Median sternotomy. No aggressive osseous lesions.  8 mm right upper lobe pulmonary nodule is benign, stable since 2012.  Review of the MIP images confirms the above findings.  CTA ABDOMEN AND PELVIS FINDINGS  The thoracic aortic aneurysm tapers to more normal caliber in the abdomen. Just inferior to the aortic hiatus, the  abdominal aorta measures 29 mm. Ectasia of the infrarenal abdominal aorta measuring 26 mm. Iliofemoral atherosclerosis is present.  Cholelithiasis. Small amount of ascites. Right renal cysts. Prostatomegaly with 54 cm transverse prostate span. Fat containing periumbilical hernia. Small bilateral inguinal hernia is. Left inguinal hernia contains a portion of the sigmoid colon. Severe colonic diverticulosis. Right inguinal hernia sac contains ascites. No aggressive osseous lesions. Lumbar spondylosis and facet arthrosis. Visceral atherosclerosis.  Review of the MIP images confirms the above findings.  IMPRESSION: 1. Cardiomegaly and mild CHF. Right-greater-than-left small bilateral pleural effusions. 2. Multifocal airspace disease in the lungs is favored to represent alveolar edema rather than multifocal pneumonia. 3. 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 Maximal luminal diameter is 62 mm on today's study, increased from 45 mm on the prior exam. The septum from the dissection appears to have torn with the aneurysmal dilation of the aorta. Significantly, no intramural or periaortic hematoma to suggest rupture. 4. Infrarenal abdominal aortic ectasia, cholelithiasis and bilateral inguinal hernias. 5. These results were called by telephone at the time of interpretation on 08/22/2013 at 12:23 PM to Dr. Devoria Albe , who verbally acknowledged these results. 6. Benign 8 mm right upper lobe pulmonary nodule.  Electronically Signed   By: Andreas NewportGeoffrey  Lamke M.D.   On: 08/22/2013 12:24   ASSESSMENT/PLAN:  1. 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 The dimension is now 62mm. Postop day #1Carotid-subclavian bypass and thoracic stent graft insertion.  Continue ASA 2. HTN - well controlled ACE I/Hydralazine and beta blockers 3. Acute diastolic CHF diuresing well on Lasix IV. He is 2.3L net negative today.    5. 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 Continue ASA/statin  6. 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 Low normal LVF EF 50-55%  8. PAF maintaining NSR - continue Amio.  Eliquis on hold for now post op 9.  Episode of VT/VF at end of surgery but none since then     Quintella ReichertURNER,Archer Vise R, MD  08/27/2013  12:59 PM

## 2013-08-27 NOTE — Progress Notes (Addendum)
Patient ID: Vista DeckRobert D Marulanda, male   DOB: [birth year], 73 y.o.   MRN: [id number] TCTS DAILY ICU PROGRESS NOTE                   [address]            [place] [id number]          [phone]   1 Day Post-Op Procedure(s) (LRB): BYPASS GRAFT CAROTID-SUBCLAVIAN (Left)  THORACIC STENT GRAFT INSERTION (N/A)  Total Length of Stay:  LOS: 5 days   Subjective: Up in chair neuro intact  Objective: Vital signs in last 24 hours: Temp:  [97.5 F (36.4 C)-98.5 F (36.9 C)] 98.2 F (36.8 C) (03/11 0351) Pulse Rate:  [47-80] 80 (03/11 0700) Cardiac Rhythm:  [-] Normal sinus rhythm (03/11 0000) Resp:  [8-31] 16 (03/11 0700) BP: (101-174)/(36-60) 107/40 mmHg (03/11 0700) SpO2:  [91 %-100 %] 92 % (03/11 0700) Arterial Line BP: (97-173)/(37-63) 102/42 mmHg (03/11 0400) FiO2 (%):  [50 %] 50 % (03/11 0351) Weight:  [178 lb 2.1 oz (80.8 kg)] 178 lb 2.1 oz (80.8 kg) (03/11 0600)  Filed Weights   08/25/13 0325 08/26/13 0450 08/27/13 0600  Weight: 178 lb 12.7 oz (81.1 kg) 176 lb 5.9 oz (80 kg) 178 lb 2.1 oz (80.8 kg)    Weight change: 1 lb 12.2 oz (0.8 kg)   Hemodynamic parameters for last 24 hours:    Intake/Output from previous day: 03/10 0701 - 03/11 0700 In: 2857.5 [I.V.:2457.5; IV Piggyback:400] Out: 2350 [Urine:2185; Drains:65; Blood:100]  Intake/Output this shift:    Current Meds: Scheduled Meds: . amiodarone  400 mg Oral Daily  . aspirin EC  81 mg Oral Daily  . atorvastatin  10 mg Oral Daily  . atropine  0.4 mg Intravenous Once  . cefUROXime (ZINACEF) 1.5 GM IVPB  1.5 g Intravenous To OR  . cefUROXime (ZINACEF)  IV  1.5 g Intravenous Q12H  . docusate sodium  100 mg Oral Daily  . enalapril  10 mg Oral Daily  . furosemide  40 mg Intravenous BID  . heparin  5,000 Units Subcutaneous 3 times per day  . hydrALAZINE  25 mg Oral 4 times per day  . loratadine  10 mg Oral Daily  . metoprolol tartrate  25 mg Oral BID  . multivitamin with minerals  1 tablet Oral  Daily  . pantoprazole  40 mg Oral Daily  . pantoprazole  40 mg Oral Daily  . potassium chloride  40 mEq Oral Daily  . sodium chloride  3 mL Intravenous Q12H   Continuous Infusions: . dextrose 5 % and 0.45% NaCl 50 mL/hr at 08/27/13 0600   PRN Meds:.sodium chloride, acetaminophen, acetaminophen, alum & mag hydroxide-simeth, guaiFENesin-dextromethorphan, hydrALAZINE, labetalol, magnesium sulfate 1 - 4 g bolus IVPB, metoprolol, nitroGLYCERIN, ondansetron, phenol, potassium chloride, sodium chloride  General appearance: alert, cooperative and no distress Neurologic: intact Heart: regular rate and rhythm, S1, S2 normal, no murmur, click, rub or gallop Lungs: diminished breath sounds bibasilar Abdomen: soft, non-tender; bowel sounds normal; no masses,  no organomegaly Extremities: extremities normal, atraumatic, no cyanosis or edema and Homans sign is negative, no sign of DVT Wound: some swelling left neck but not tight, drain in place, left groin without hematoma Palpable left brachial, radial pulse Lab Results: CBC: Recent Labs  08/26/13 1855 08/27/13 0440  WBC  --  12.1*  HGB 11.9* 10.4*  HCT 35.0* 34.7*  PLT  --  240   BMET:  Recent  Labs  08/25/13 0433  08/26/13 1855 08/27/13 0440  NA 143  < > 140 141  K 3.4*  < > 3.7 3.9  CL 104  --   --  103  CO2 25  --   --  26  GLUCOSE 92  --  110* 109*  BUN 20  --   --  26*  CREATININE 0.95  --   --  1.07  CALCIUM 8.6  --   --  8.0*  < > = values in this interval not displayed.  PT/INR:  Recent Labs  08/27/13 0440  LABPROT 15.1  INR 1.22   Radiology: Dg Chest Portable 1 View  08/26/2013   CLINICAL DATA Stent graft placement.  EXAM PORTABLE CHEST - 1 VIEW  COMPARISON Chest x-ray from 2 days prior  FINDINGS Cardiopericardial enlargement which appears similar to prior. Aneurysmal thoracic aorta, with new stent graft. There is a surgical drain in the left neck where there is asymmetric soft tissue density, reportedly hematoma  present on exam. Right IJ sheath, tip at the level of the SVC origin. There is pulmonary edema, both interstitial and alveolar, new from prior. Left pleural effusion, at least moderate. There is likely a layering right pleural effusion.  IMPRESSION Pulmonary edema and moderate left pleural effusion. Probable layering right pleural effusion.  SIGNATURE  Electronically Signed   By: Tiburcio Pea M.D.   On: 08/26/2013 14:46     Assessment/Plan: S/P Procedure(s) (LRB): BYPASS GRAFT CAROTID-SUBCLAVIAN (Left)  THORACIC STENT GRAFT INSERTION (N/A) Mobilize Diuresis d/c tubes/lines Patient was on elquis for Afib, currently in sinus on po amiodarone, episode of vt/vf nonsustained at end of case yesterday, no further rhythm problems, k corrected  Renal function stable Still needs RX for CHF as preop Resume  dvt prophylaxis hold on elquis for now     Dashanique Brownstein B 08/27/2013 8:28 AM

## 2013-08-27 NOTE — Progress Notes (Addendum)
[facility]  Subjective  - "I slept all night."  He has no back pain or abdominal pain this am.  His worse pain is when he moves his neck.   Objective 107/40 80 98.2 F (36.8 C) (Axillary) 16 92%  Intake/Output Summary (Last 24 hours) at 08/27/13 0749 Last data filed at 08/27/13 0600  Gross per 24 hour  Intake 2857.5 ml  Output   2350 ml  Net  507.5 ml    Feet are warm to touch.  Doppler signals bilaterally DP/PT Palpable radial pulse left arm. Neck incision healing well with min-mod edema.  Drain in place. Left groin incision is soft  Assessment/Planning: POD #1 Surgeon: Jorge Ny  Co-surgeon: Ofilia Neas  Procedure: #1: Left carotid subclavian bypass with 8 mm dacryon  #2: Ligation of proximal left subclavian artery  #3: Ultrasound-guided access, left common femoral artery  #4: Catheter in aorta x2  #5: Aortic arch angiogram  #6: Endovascular repair of thoracic aortic aneurysm  #7: Distal extension x1 Developed VF at the end of case which spontaneously resolved Sinus rhythm this am JP drain left subclavian output 65 cc total since surgery   BP 107/40  Foley D/C'd this morning no independent void yet Hypokalemia replaced K+ 3.9 , HGB 10.4 Heparin 5,00 units q 8.     Clinton Gallant [facility] 08/27/2013 7:49 AM --  Laboratory Lab Results:  Recent Labs  08/26/13 1855 08/27/13 0440  WBC  --  12.1*  HGB 11.9* 10.4*  HCT 35.0* 34.7*  PLT  --  240   BMET  Recent Labs  08/25/13 0433  08/26/13 1855 08/27/13 0440  NA 143  < > 140 141  K 3.4*  < > 3.7 3.9  CL 104  --   --  103  CO2 25  --   --  26  GLUCOSE 92  --  110* 109*  BUN 20  --   --  26*  CREATININE 0.95  --   --  1.07  CALCIUM 8.6  --   --  8.0*  < > = values in this interval not displayed.  COAG Lab Results  Component Value Date   INR 1.22 08/27/2013   INR 1.59* 08/22/2013   INR 1.15 05/04/2013   No results found for this basename: PTT       I agree with the above.  Patient complains of soreness in his left neck.  His left groin is soft. No significant change in the evaluation of the left neck swelling.  This remained soft.  JP with 65 cc output since surgery.  I will leave the drain in one more day. Patient remains neurologically intact. He'll most likely be able to be transferred to the floor later today.  I discussed the possibility of discharge Thursday or Friday.  This will need to be coordinated with cardiology regarding his arrhythmias.  [name] [name]

## 2013-08-28 ENCOUNTER — Encounter (HOSPITAL_COMMUNITY): Payer: Self-pay | Admitting: Surgery

## 2013-08-28 ENCOUNTER — Inpatient Hospital Stay (HOSPITAL_COMMUNITY): Payer: Medicare Other

## 2013-08-28 LAB — CBC
HCT: 30 % — ABNORMAL LOW (ref 39.0–52.0)
Hemoglobin: 9.4 g/dL — ABNORMAL LOW (ref 13.0–17.0)
MCH: 29 pg (ref 26.0–34.0)
MCHC: 31.3 g/dL (ref 30.0–36.0)
MCV: 92.6 fL (ref 78.0–100.0)
Platelets: 169 10*3/uL (ref 150–400)
RBC: 3.24 MIL/uL — ABNORMAL LOW (ref 4.22–5.81)
RDW: 18.3 % — ABNORMAL HIGH (ref 11.5–15.5)
WBC: 11.4 10*3/uL — ABNORMAL HIGH (ref 4.0–10.5)

## 2013-08-28 LAB — BASIC METABOLIC PANEL
BUN: 30 mg/dL — ABNORMAL HIGH (ref 6–23)
CO2: 26 mEq/L (ref 19–32)
Calcium: 7.9 mg/dL — ABNORMAL LOW (ref 8.4–10.5)
Chloride: 102 mEq/L (ref 96–112)
Creatinine, Ser: 0.94 mg/dL (ref 0.50–1.35)
GFR calc Af Amer: >90 mL/min (ref >90)
GFR calc non Af Amer: 81 mL/min — ABNORMAL LOW (ref >90)
Glucose, Bld: 130 mg/dL — ABNORMAL HIGH (ref 70–99)
Potassium: 4 mEq/L (ref 3.7–5.3)
Sodium: 139 mEq/L (ref 137–147)

## 2013-08-28 MED ORDER — LIDOCAINE HCL 2 % EX GEL
Freq: Once | CUTANEOUS | Status: AC
  Administered 2013-08-28: 14:00:00 via URETHRAL
  Filled 2013-08-28 (×2): qty 5

## 2013-08-28 MED ORDER — TAMSULOSIN HCL 0.4 MG PO CAPS
0.4000 mg | ORAL_CAPSULE | Freq: Every day | ORAL | Status: DC
  Administered 2013-08-28 – 2013-08-29 (×2): 0.4 mg via ORAL
  Filled 2013-08-28 (×2): qty 1

## 2013-08-28 MED ORDER — ZOLPIDEM TARTRATE 5 MG PO TABS
5.0000 mg | ORAL_TABLET | Freq: Every evening | ORAL | Status: DC | PRN
  Administered 2013-08-28: 5 mg via ORAL
  Filled 2013-08-28: qty 1

## 2013-08-28 NOTE — Progress Notes (Signed)
Bladder scan was 431, fell within parameters to place a foley cath to straight drain. Attempted to place a 16-fr foley cath as per MD order but was unsuccessful. Catheter was fully advanced and bladder full. Clinton Gallant PA-C notified and will contact urology. Franki Cabot, RN

## 2013-08-28 NOTE — Progress Notes (Addendum)
[facility]  Subjective  - Walked this morning, He tolerates clear liquids.   Objective 113/30 69 99 F (37.2 C) (Oral) 19 97%  Intake/Output Summary (Last 24 hours) at 08/28/13 0747 Last data filed at 08/28/13 0600  Gross per 24 hour  Intake   2640 ml  Output    875 ml  Net   1765 ml   Drain output 75 cc yesterday total  Incisions healing well Palpable left radial puls, feet warm to touch. Doppler DP/PT signals Groin soft no hematoma.  Assessment/Planning: POD #2 Surgeon: Jorge Ny  Co-surgeon: Ofilia Neas  Procedure: #1: Left carotid subclavian bypass with 8 mm dacryon  #2: Ligation of proximal left subclavian artery  #3: Ultrasound-guided access, left common femoral artery  #4: Catheter in aorta x2  #5: Aortic arch angiogram  #6: Endovascular repair of thoracic aortic aneurysm  #7: Distal extension x1  Developed VF at the end of case which spontaneously resolved  Sinus rhythm this am  JP drain left subclavian output 75 cc total since surgery pending D/C per Dr. Myra Gianotti  BP 133/48, HR 73 In/Out cath times 2 400 cc urine both occasions.  If in/out cath again will maintain foley for 24 hours and then D/C for another trial. Hypokalemia replenished K+ 4.0 today HGB 9.4  Heart failure medications controlled by Dr. Gala Romney Heparin SQ was D/C'd Lovenox 30 QD  Flomax started daily/bladder scan leave foley if greater than 200 cc on scan D/C JP and right neck IV after gaining peripheral IV site. Transfer to 2W   Clinton Gallant [facility] 08/28/2013 7:47 AM --  Laboratory Lab Results:  Recent Labs  08/27/13 0440 08/28/13 0450  WBC 12.1* 11.4*  HGB 10.4* 9.4*  HCT 34.7* 30.0*  PLT 240 169   BMET  Recent Labs  08/27/13 0440 08/28/13 0450  NA 141 139  K 3.9 4.0  CL 103 102  CO2 26 26  GLUCOSE 109* 130*  BUN 26* 30*  CREATININE 1.07 0.94  CALCIUM 8.0* 7.9*    COAG Lab Results  Component Value Date   INR 1.22 08/27/2013   INR 1.59* 08/22/2013   INR 1.15 05/04/2013   No results found for this basename: PTT      Called Urology consult.  RN states his bladder scan showed 400+ cc and they tried to place a foley catheter and got no Urine return.  They in/out cath times 2 before. COLLINS, EMMA MAUREEN  I agree with the above.  The patient is doing much better today.  He tolerated regular diet today.  He has walked from the ICU to the floor.  His incision remained soft and intact.  The JP was removed this morning.  He is neurologically intact.  Pending his urologic issues, there is a reasonable chance that he could be discharged home tomorrow.  Durene Cal

## 2013-08-28 NOTE — Progress Notes (Signed)
Patient ID: [name] [name], male   DOB: 02-28-1941, 73 y.o.   MRN: [id number] TCTS DAILY ICU PROGRESS NOTE                   [address]            [name] [id number]          [phone]   2 Days Post-Op Procedure(s) (LRB): BYPASS GRAFT CAROTID-SUBCLAVIAN (Left)  THORACIC STENT GRAFT INSERTION (N/A)  Total Length of Stay:  LOS: 6 days   Subjective: Unable to pass urine, i/o cath several times  Objective: Vital signs in last 24 hours: Temp:  [97.6 F (36.4 C)-99 F (37.2 C)] 99 F (37.2 C) (03/12 0338) Pulse Rate:  [57-81] 69 (03/12 0500) Cardiac Rhythm:  [-] Heart block (03/12 0400) Resp:  [16-31] 19 (03/12 0500) BP: (98-135)/(26-47) 113/30 mmHg (03/12 0500) SpO2:  [91 %-98 %] 97 % (03/12 0500) Weight:  [180 lb 12.4 oz (82 kg)] 180 lb 12.4 oz (82 kg) (03/12 0600)  Filed Weights   08/26/13 0450 08/27/13 0600 08/28/13 0600  Weight: 176 lb 5.9 oz (80 kg) 178 lb 2.1 oz (80.8 kg) 180 lb 12.4 oz (82 kg)    Weight change: 2 lb 10.3 oz (1.2 kg)   Hemodynamic parameters for last 24 hours:    Intake/Output from previous day: 03/11 0701 - 03/12 0700 In: 2640 [P.O.:1440; I.V.:1150; IV Piggyback:50] Out: 875 [Urine:800; Drains:75]  Intake/Output this shift:    Current Meds: Scheduled Meds: . amiodarone  400 mg Oral Daily  . aspirin EC  81 mg Oral Daily  . atorvastatin  10 mg Oral Daily  . atropine  0.4 mg Intravenous Once  . docusate sodium  100 mg Oral Daily  . enalapril  10 mg Oral Daily  . enoxaparin (LOVENOX) injection  30 mg Subcutaneous Q24H  . furosemide  40 mg Intravenous BID  . hydrALAZINE  25 mg Oral 4 times per day  . loratadine  10 mg Oral Daily  . metoprolol tartrate  25 mg Oral BID  . multivitamin with minerals  1 tablet Oral Daily  . pantoprazole  40 mg Oral Daily  . potassium chloride  40 mEq Oral Daily  . sodium chloride  3 mL Intravenous Q12H  . tamsulosin  0.4 mg Oral Daily   Continuous Infusions: . dextrose 5 % and 0.45%  NaCl 50 mL/hr at 08/28/13 0600   PRN Meds:.sodium chloride, acetaminophen, acetaminophen, alum & mag hydroxide-simeth, guaiFENesin-dextromethorphan, hydrALAZINE, labetalol, magnesium sulfate 1 - 4 g bolus IVPB, metoprolol, nitroGLYCERIN, ondansetron, phenol, potassium chloride, sodium chloride  General appearance: alert, cooperative and no distress Neurologic: intact Heart: regular rate and rhythm, S1, S2 normal, no murmur, click, rub or gallop Lungs: diminished breath sounds bibasilar Abdomen: soft, non-tender; bowel sounds normal; no masses,  no organomegaly Extremities: extremities normal, atraumatic, no cyanosis or edema and Homans sign is negative, no sign of DVT Wound: neck less swollen  Lab Results: CBC: Recent Labs  08/27/13 0440 08/28/13 0450  WBC 12.1* 11.4*  HGB 10.4* 9.4*  HCT 34.7* 30.0*  PLT 240 169   BMET:  Recent Labs  08/27/13 0440 08/28/13 0450  NA 141 139  K 3.9 4.0  CL 103 102  CO2 26 26  GLUCOSE 109* 130*  BUN 26* 30*  CREATININE 1.07 0.94  CALCIUM 8.0* 7.9*    PT/INR:  Recent Labs  08/27/13 0440  LABPROT 15.1  INR 1.22   Radiology: Dg Chest  Port 1 View  08/28/2013   CLINICAL DATA Carotid subclavian bypass.  Thoracic stent graft  EXAM PORTABLE CHEST - 1 VIEW  COMPARISON 08/26/2013  FINDINGS Right jugular sheath in the right innominate vein.  Improvement in bilateral edema. Left lower lobe atelectasis and left effusion unchanged. Aortic stent graft across the aortic arch is unchanged in position.  IMPRESSION Improvement in pulmonary edema.  Left lower lobe atelectasis and effusion remain.  SIGNATURE  Electronically Signed   By: Marlan Palau M.D.   On: 08/28/2013 08:09   Dg Chest Portable 1 View  08/26/2013   CLINICAL DATA Stent graft placement.  EXAM PORTABLE CHEST - 1 VIEW  COMPARISON Chest x-ray from 2 days prior  FINDINGS Cardiopericardial enlargement which appears similar to prior. Aneurysmal thoracic aorta, with new stent graft. There is a  surgical drain in the left neck where there is asymmetric soft tissue density, reportedly hematoma present on exam. Right IJ sheath, tip at the level of the SVC origin. There is pulmonary edema, both interstitial and alveolar, new from prior. Left pleural effusion, at least moderate. There is likely a layering right pleural effusion.  IMPRESSION Pulmonary edema and moderate left pleural effusion. Probable layering right pleural effusion.  SIGNATURE  Electronically Signed   By: Tiburcio Pea M.D.   On: 08/26/2013 14:46     Assessment/Plan: S/P Procedure(s) (LRB): BYPASS GRAFT CAROTID-SUBCLAVIAN (Left)  THORACIC STENT GRAFT INSERTION (N/A) Expected Acute  Blood - loss Anemia Urinary retention, starting Flomax check bladder scan may need urology to see   [name] [name] 08/28/2013 8:18 AM

## 2013-08-28 NOTE — Progress Notes (Signed)
Report called to 2W to Kim in the AM.  Pt did have a PIV placed by IV Team prior to transfer.  RIJ cordis was d/c'd after PIV was placed.  Attempt to place a 16FR foley was made but was not successful. The bladder scan had indicated the pt had of urine.  [name] [name], P.A. [facility] was notified and Urology was consulted.  Urology did round prior to the pt's transfer.  Dr. Brunilda Payor did place a 16FR coude with lidocaine for comfort.  Myself and my orientee, Franki Cabot were both present.  The pt tolerated the placement well.  A foley catheter leg strap was applied.  The pt returned a total of approx. 900.  Pt ambulated to 2W with assistance of wheelchair.  Pt tolerated well.

## 2013-08-28 NOTE — Consult Note (Addendum)
Urology Consult  Referring physician: Dr. Lanelle Bal Reason for referral: Inability to insert Foley catheter  History of Present Illness: Patient is a 73 years old male who underwent a carotid subclavian bypass graft on the left side on 3/10. Postoperatively he had difficulty voiding and was in and out catheterized. He was started on Flomax. Since he continued to have difficulty voiding Dr. Servando Snare ordered an indwelling Foley catheter. The nurses attempted to pass a #16 [place] Foley catheter in the bladder but admits some resistance. I was asked to see the patient in consultation. Bladder scan showed 430 cc of urine in the bladder. I then inserted a #16 [place] coud catheter without difficulty. 900 cc of urine were drained out of the bladder. The catheter was left indwelling.  Past Medical History  Diagnosis Date  . Hyperlipidemia   . DVT (deep venous thrombosis)     post knee surgery  . Tobacco abuse   . HTN (hypertension)   . Descending aortic aneurysm   . Aortic valve insufficiency   . Thoracic aneurysm   . S/P AVR (aortic valve replacement)   . CAD (coronary artery disease)   . Atrial flutter    Past Surgical History  Procedure Laterality Date  . Right groin lymphocele  [id number]  . Aortic valve replacement  [id number]  . Replacement total knee      bilateral  . Carotid-subclavian bypass graft Left 08/26/2013    Procedure: BYPASS GRAFT CAROTID-SUBCLAVIAN;  Surgeon: Serafina Mitchell, MD;  Location: [facility];  Service: Vascular;  Laterality: Left;  . Endovascular stent insertion N/A 08/26/2013    Procedure:  THORACIC STENT GRAFT INSERTION;  Surgeon: Serafina Mitchell, MD;  Location: [facility];  Service: Vascular;  Laterality: N/A;    Medications: Hydralazine, metoprolol, labetalol, pantoprazole, atorvastatin, furosemide, enalapril Allergies:  Allergies  Allergen Reactions  . Lortab [Hydrocodone-Acetaminophen] Hives  . Morphine And Related Hives    History reviewed. No pertinent  family history. Social History:  reports that he quit smoking about 39 years ago. His smoking use included Cigarettes. He smoked 0.00 packs per day. He has never used smokeless tobacco. He reports that he does not drink alcohol or use illicit drugs.  ROS: All systems are reviewed and negative except as noted.   Physical Exam:  Vital signs in last 24 hours: Temp:  [98.3 F (36.8 C)-99.3 F (37.4 C)] 99.3 F (37.4 C) (03/12 2113) Pulse Rate:  [51-78] 70 (03/12 2113) Resp:  [16-31] 18 (03/12 2113) BP: (97-142)/(26-95) 97/32 mmHg (03/12 2113) SpO2:  [90 %-99 %] 90 % (03/12 2113) Weight:  [82 kg (180 lb 12.4 oz)] 82 kg (180 lb 12.4 oz) (03/12 0600)   Abdomen: No masses. Tender in the suprapubic area. Neurological: Normal sensation to touch Musculoskeletal: Normal motor function arms and legs Lymphatics: No inguinal adenopathy Skin: No rashes Genitourinary: Penis is circumcised. Meatus is normal. Scrotum is normal in appearance. There is no testicular mass.  Laboratory Data:  Results for orders placed during the hospital encounter of 08/22/13 (from the past 72 hour(s))  GLUCOSE, CAPILLARY     Status: None   Collection Time    08/26/13  5:42 AM      Result Value Ref Range   Glucose-Capillary 98  70 - 99 mg/dL  POCT I-STAT 7, (LYTES, BLD GAS, ICA,H+H)     Status: Abnormal   Collection Time    08/26/13 12:55 PM      Result Value Ref Range   pH, Arterial 7.545 (*)  7.350 - 7.450   pCO2 arterial 35.8  35.0 - 45.0 mmHg   pO2, Arterial 408.0 (*) 80.0 - 100.0 mmHg   Bicarbonate 31.0 (*) 20.0 - 24.0 mEq/L   TCO2 32  0 - 100 mmol/L   O2 Saturation 100.0     Acid-Base Excess 8.0 (*) 0.0 - 2.0 mmol/L   Sodium 141  137 - 147 mEq/L   Potassium 2.9 (*) 3.7 - 5.3 mEq/L   Calcium, Ion 1.05 (*) 1.13 - 1.30 mmol/L   HCT 27.0 (*) 39.0 - 52.0 %   Hemoglobin 9.2 (*) 13.0 - 17.0 g/dL   Patient temperature 36.7 C     Sample type ARTERIAL     Comment NOTIFIED PHYSICIAN    BLOOD GAS, ARTERIAL      Status: Abnormal   Collection Time    08/26/13  4:28 PM      Result Value Ref Range   FIO2 0.50     Delivery systems BILEVEL POSITIVE AIRWAY PRESSURE     Mode BILEVEL POSITIVE AIRWAY PRESSURE     Inspiratory PAP 12.0     Expiratory PAP 6.0     pH, Arterial 7.350  7.350 - 7.450   pCO2 arterial 46.6 (*) 35.0 - 45.0 mmHg   pO2, Arterial 110.0 (*) 80.0 - 100.0 mmHg   Bicarbonate 25.1 (*) 20.0 - 24.0 mEq/L   TCO2 26.5  0 - 100 mmol/L   Acid-Base Excess 0.2  0.0 - 2.0 mmol/L   O2 Saturation 98.4     Patient temperature 98.6     Collection site A-LINE     Drawn by DRAWN BY RN     Sample type ARTERIAL DRAW    POCT I-STAT 4, (NA,K, GLUC, HGB,HCT)     Status: Abnormal   Collection Time    08/26/13  6:55 PM      Result Value Ref Range   Sodium 140  137 - 147 mEq/L   Potassium 3.7  3.7 - 5.3 mEq/L   Glucose, Bld 110 (*) 70 - 99 mg/dL   HCT 35.0 (*) 39.0 - 52.0 %   Hemoglobin 11.9 (*) 13.0 - 17.0 g/dL  CBC     Status: Abnormal   Collection Time    08/27/13  4:40 AM      Result Value Ref Range   WBC 12.1 (*) 4.0 - 10.5 K/uL   RBC 3.69 (*) 4.22 - 5.81 MIL/uL   Hemoglobin 10.4 (*) 13.0 - 17.0 g/dL   HCT 34.7 (*) 39.0 - 52.0 %   MCV 94.0  78.0 - 100.0 fL   MCH 28.2  26.0 - 34.0 pg   MCHC 30.0  30.0 - 36.0 g/dL   RDW 18.2 (*) 11.5 - 15.5 %   Platelets 240  150 - 400 K/uL  BASIC METABOLIC PANEL     Status: Abnormal   Collection Time    08/27/13  4:40 AM      Result Value Ref Range   Sodium 141  137 - 147 mEq/L   Potassium 3.9  3.7 - 5.3 mEq/L   Chloride 103  96 - 112 mEq/L   CO2 26  19 - 32 mEq/L   Glucose, Bld 109 (*) 70 - 99 mg/dL   BUN 26 (*) 6 - 23 mg/dL   Creatinine, Ser 1.07  0.50 - 1.35 mg/dL   Calcium 8.0 (*) 8.4 - 10.5 mg/dL   GFR calc non Af Amer 67 (*) >90 mL/min   GFR calc Af Wyvonnia Lora  77 (*) >90 mL/min   Comment: (NOTE)     The eGFR has been calculated using the CKD EPI equation.     This calculation has not been validated in all clinical situations.     eGFR's  persistently <90 mL/min signify possible Chronic Kidney     Disease.  HEMOGLOBIN A1C     Status: Abnormal   Collection Time    08/27/13  4:40 AM      Result Value Ref Range   Hemoglobin A1C 6.1 (*) <5.7 %   Comment: (NOTE)                                                                               According to the ADA Clinical Practice Recommendations for 2011, when     HbA1c is used as a screening test:      >=6.5%   Diagnostic of Diabetes Mellitus               (if abnormal result is confirmed)     5.7-6.4%   Increased risk of developing Diabetes Mellitus     References:Diagnosis and Classification of Diabetes Mellitus,Diabetes     RVUY,2334,35(WYSHU 1):S62-S69 and Standards of Medical Care in             Diabetes - 2011,Diabetes Care,2011,34 (Suppl 1):S11-S61.   Mean Plasma Glucose 128 (*) <117 mg/dL   Comment: Performed at [facility]     Status: None   Collection Time    08/27/13  4:40 AM      Result Value Ref Range   Prothrombin Time 15.1  11.6 - 15.2 seconds   INR 1.22  0.00 - 8.37  BASIC METABOLIC PANEL     Status: Abnormal   Collection Time    08/28/13  4:50 AM      Result Value Ref Range   Sodium 139  137 - 147 mEq/L   Potassium 4.0  3.7 - 5.3 mEq/L   Chloride 102  96 - 112 mEq/L   CO2 26  19 - 32 mEq/L   Glucose, Bld 130 (*) 70 - 99 mg/dL   BUN 30 (*) 6 - 23 mg/dL   Creatinine, Ser 0.94  0.50 - 1.35 mg/dL   Calcium 7.9 (*) 8.4 - 10.5 mg/dL   GFR calc non Af Amer 81 (*) >90 mL/min   GFR calc Af Amer >90  >90 mL/min   Comment: (NOTE)     The eGFR has been calculated using the CKD EPI equation.     This calculation has not been validated in all clinical situations.     eGFR's persistently <90 mL/min signify possible Chronic Kidney     Disease.  CBC     Status: Abnormal   Collection Time    08/28/13  4:50 AM      Result Value Ref Range   WBC 11.4 (*) 4.0 - 10.5 K/uL   RBC 3.24 (*) 4.22 - 5.81 MIL/uL   Hemoglobin 9.4 (*) 13.0 - 17.0 g/dL    HCT 30.0 (*) 39.0 - 52.0 %   MCV 92.6  78.0 - 100.0 fL   MCH 29.0  26.0 - 34.0 pg  MCHC 31.3  30.0 - 36.0 g/dL   RDW 18.3 (*) 11.5 - 15.5 %   Platelets 169  150 - 400 K/uL   Comment: DELTA CHECK NOTED     SPECIMEN CHECKED FOR CLOTS     REPEATED TO VERIFY   Recent Results (from the past 240 hour(s))  MRSA PCR SCREENING     Status: None   Collection Time    08/22/13  8:21 PM      Result Value Ref Range Status   MRSA by PCR NEGATIVE  NEGATIVE Final   Comment:            The GeneXpert MRSA Assay (FDA     approved for NASAL specimens     only), is one component of a     comprehensive MRSA colonization     surveillance program. It is not     intended to diagnose MRSA     infection nor to guide or     monitor treatment for     MRSA infections.  SURGICAL PCR SCREEN     Status: None   Collection Time    08/25/13  7:50 PM      Result Value Ref Range Status   MRSA, PCR NEGATIVE  NEGATIVE Final   Staphylococcus aureus NEGATIVE  NEGATIVE Final   Comment:            The Xpert SA Assay (FDA     approved for NASAL specimens     in patients over 52 years of age),     is one component of     a comprehensive surveillance     program.  Test performance has     been validated by [employer] for patients greater     than or equal to 26 year old.     It is not intended     to diagnose infection nor to     guide or monitor treatment.   Creatinine:  Recent Labs  08/22/13 0959 08/22/13 1045 08/23/13 0334 08/24/13 0355 08/25/13 0433 08/27/13 0440 08/28/13 0450  CREATININE 0.99 0.99 1.12 0.96 0.95 1.07 0.94    Impression/Assessment:  Urinary retention  Plan:  Insert a #16 Foley catheter. Leave indwelling. Continue Flomax. Voiding trial when patient is ambulatory.  Hanley Ben 08/28/2013, 9:20 PM     CC: Dr. Lanelle Bal

## 2013-08-28 NOTE — Progress Notes (Signed)
SUBJECTIVE:  No complaints, transfering to 2w  OBJECTIVE:   Vitals:   Filed Vitals:   08/28/13 1158 08/28/13 1200 08/28/13 1300 08/28/13 1400  BP:  112/35 119/42   Pulse:  51 59 62  Temp: 98.8 F (37.1 C)     TempSrc: Oral     Resp:  21 22 24   Height:      Weight:      SpO2:  94% 93% 94%   I&O's:    Intake/Output Summary (Last 24 hours) at 08/28/13 1422 Last data filed at 08/28/13 1400  Gross per 24 hour  Intake   2600 ml  Output   1760 ml  Net    840 ml   TELEMETRY: Reviewed telemetry pt in NSR:  PHYSICAL EXAM General: appears chronically ill Head: Eyes PERRLA, No xanthomas.   Normal cephalic and atramatic  Lungs:   Decreased breath sounds , esp. On left side. Consolidation  Heart:   HRRR S1 S2 Pulses are 2+ & equal. Abdomen: Bowel sounds are positive, abdomen soft and non-tender without masses  Extremities:   No clubbing, cyanosis or edema.  DP +1 Neuro: Alert and oriented X 3. Psych:  Good affect, responds appropriately   LABS: Basic Metabolic Panel:  Recent Labs  16/03/9602/11/15 0440 08/28/13 0450  NA 141 139  K 3.9 4.0  CL 103 102  CO2 26 26  GLUCOSE 109* 130*  BUN 26* 30*  CREATININE 1.07 0.94  CALCIUM 8.0* 7.9*   Liver Function Tests: No results found for this basename: AST, ALT, ALKPHOS, BILITOT, PROT, ALBUMIN,  in the last 72 hours No results found for this basename: LIPASE, AMYLASE,  in the last 72 hours CBC:  Recent Labs  08/27/13 0440 08/28/13 0450  WBC 12.1* 11.4*  HGB 10.4* 9.4*  HCT 34.7* 30.0*  MCV 94.0 92.6  PLT 240 169   Cardiac Enzymes: No results found for this basename: CKTOTAL, CKMB, CKMBINDEX, TROPONINI,  in the last 72 hours BNP: No components found with this basename: POCBNP,  D-Dimer: No results found for this basename: DDIMER,  in the last 72 hours Hemoglobin A1C:  Recent Labs  08/27/13 0440  HGBA1C 6.1*   Fasting Lipid Panel: No results found for this basename: CHOL, HDL, LDLCALC, TRIG, CHOLHDL, LDLDIRECT,  in  the last 72 hours Thyroid Function Tests: No results found for this basename: TSH, T4TOTAL, FREET3, T3FREE, THYROIDAB,  in the last 72 hours Anemia Panel: No results found for this basename: VITAMINB12, FOLATE, FERRITIN, TIBC, IRON, RETICCTPCT,  in the last 72 hours Coag Panel:   Lab Results  Component Value Date   INR 1.22 08/27/2013   INR 1.59* 08/22/2013   INR 1.15 05/04/2013    A/P    1. 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 Was  62mm. Postop day #1Carotid-subclavian bypass and thoracic stent graft insertion.  Continue ASA 2. HTN - well controlled ACE I/Hydralazine and beta blockers 3. Acute diastolic CHF diuresing well on Lasix IV. He is 2.3L net negative today.  5. 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 Continue ASA/statin  6. 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 Low normal LVF EF 50-55%  8. PAF maintaining NSR - continue Amio.  Eliquis on hold for now post op 9.  Episode of VT/VF at end of surgery but none since then   Alvia GrovePhilip J. Ladelle Teodoro, Jr.,  MD, [facility] 08/28/2013, 2:25 PM Office - [phone] Pager [phone]

## 2013-08-29 MED ORDER — ENALAPRIL MALEATE 10 MG PO TABS: 10.0000 mg | ORAL_TABLET | Freq: Every day | ORAL | Status: DC

## 2013-08-29 MED ORDER — POTASSIUM CHLORIDE CRYS ER 20 MEQ PO TBCR: 20.0000 meq | EXTENDED_RELEASE_TABLET | Freq: Two times a day (BID) | ORAL | Status: DC

## 2013-08-29 MED ORDER — HYDRALAZINE HCL 25 MG PO TABS: 25.0000 mg | ORAL_TABLET | Freq: Four times a day (QID) | ORAL | Status: DC

## 2013-08-29 MED ORDER — AMIODARONE HCL 200 MG PO TABS: 200.0000 mg | ORAL_TABLET | Freq: Every day | ORAL | Status: DC

## 2013-08-29 MED ORDER — METOPROLOL TARTRATE 25 MG PO TABS: 25.0000 mg | ORAL_TABLET | Freq: Two times a day (BID) | ORAL | Status: DC

## 2013-08-29 MED ORDER — FUROSEMIDE 40 MG PO TABS
40.0000 mg | ORAL_TABLET | Freq: Two times a day (BID) | ORAL | Status: DC
  Filled 2013-08-29 (×2): qty 1

## 2013-08-29 MED ORDER — TAMSULOSIN HCL 0.4 MG PO CAPS: 0.4000 mg | ORAL_CAPSULE | Freq: Every day | ORAL | Status: DC

## 2013-08-29 NOTE — Progress Notes (Signed)
    Subjective  - POD #3  Patient had a pseudo-fall earlier this morning with no acute injury. Foley catheter was replaced by urology yesterday was 900 cc output. No significant complaints of pain   Physical Exam:  Left neck incision is healing nicely.  No significant swelling. Neurologically intact left groin incision without hematoma.     Assessment/Plan:  POD #3  Overall the patient has recovered from his operation without significant difficulty.  He did have to have a Foley catheter replaced and he will likely go home with this.  The current plan is for urology to remove this in the office on Monday.  From a surgical perspective, the patient is cleared for discharge.  Cardiology is addressing some of his medication changes, with the anticipated discharge tomorrow.  No contraindication from a surgical perspective with restarting anticoagulation.  BRABHAM IV, V. WELLS 08/29/2013 11:57 AM --  Filed Vitals:   08/29/13 0930  BP: 156/44  Pulse: 78  Temp:   Resp:     Intake/Output Summary (Last 24 hours) at 08/29/13 1157 Last data filed at 08/29/13 0700  Gross per 24 hour  Intake    960 ml  Output    875 ml  Net     85 ml     Laboratory CBC    Component Value Date/Time   WBC 11.4* 08/28/2013 0450   HGB 9.4* 08/28/2013 0450   HCT 30.0* 08/28/2013 0450   PLT 169 08/28/2013 0450    BMET    Component Value Date/Time   NA 139 08/28/2013 0450   K 4.0 08/28/2013 0450   CL 102 08/28/2013 0450   CO2 26 08/28/2013 0450   GLUCOSE 130* 08/28/2013 0450   BUN 30* 08/28/2013 0450   CREATININE 0.94 08/28/2013 0450   CALCIUM 7.9* 08/28/2013 0450   GFRNONAA 81* 08/28/2013 0450   GFRAA >90 08/28/2013 0450    COAG Lab Results  Component Value Date   INR 1.22 08/27/2013   INR 1.59* 08/22/2013   INR 1.15 05/04/2013   No results found for this basename: PTT    Antibiotics Anti-infectives   Start     Dose/Rate Route Frequency Ordered Stop   08/26/13 2359  cefUROXime (ZINACEF) 1.5  g in dextrose 5 % 50 mL IVPB     1.5 g 100 mL/hr over 30 Minutes Intravenous Every 12 hours 08/26/13 1405 08/27/13 1245   08/26/13 1215  cefUROXime (ZINACEF) 1.5 g in dextrose 5 % 50 mL IVPB     1.5 g 100 mL/hr over 30 Minutes Intravenous To Surgery 08/26/13 1212 08/27/13 1215   08/26/13 0400  vancomycin (VANCOCIN) 1,250 mg in sodium chloride 0.9 % 250 mL IVPB  Status:  Discontinued     1,250 mg 166.7 mL/hr over 90 Minutes Intravenous To Surgery 08/25/13 1438 08/26/13 1756   08/26/13 0400  cefUROXime (ZINACEF) 1.5 g in dextrose 5 % 50 mL IVPB     1.5 g 100 mL/hr over 30 Minutes Intravenous To Surgery 08/25/13 1438 08/26/13 1218   08/26/13 0400  cefUROXime (ZINACEF) 750 mg in dextrose 5 % 50 mL IVPB  Status:  Discontinued     750 mg 100 mL/hr over 30 Minutes Intravenous To Surgery 08/25/13 1438 08/26/13 1756       V. Charlena Cross, M.D. [facility] Office: [phone] Pager:  [phone]

## 2013-08-29 NOTE — Progress Notes (Signed)
[facility]  Subjective  - Doing well over all.  He did have a fall and caught himself against the wall in the bathroom.  He got twisted up in his hospital gown.  He denise dizziness or blackouts.   Objective 156/44 78 99.4 F (37.4 C) (Oral) 18 97%  Intake/Output Summary (Last 24 hours) at 08/29/13 1223 Last data filed at 08/29/13 0700  Gross per 24 hour  Intake    960 ml  Output    875 ml  Net     85 ml    Left groin incision C/D/I Left subclavian incision ecchymosis, C/D/I. Palpable left radial puls, feet warm to touch.   Assessment/Planning: POD #3 Surgeon: Jorge Ny  Co-surgeon: Ofilia Neas  Procedure: #1: Left carotid subclavian bypass with 8 mm dacryon  #2: Ligation of proximal left subclavian artery  #3: Ultrasound-guided access, left common femoral artery  #4: Catheter in aorta x2  #5: Aortic arch angiogram  #6: Endovascular repair of thoracic aortic aneurysm  #7: Distal extension x1   Foley in place on flomax, plans as outlined per urology BP well controlled, only one reading of systolic>140- mostly in 1teens D/C planning per cardiology as patient was admitted for CHF symptoms, still needs resolution of anticoagulation (eliquis prior to admission) on Cordarone (question to stop per Dr Tillie Rung), and diuretic BP meds  High risk for early readmission for CHF so will need close medical follow up  Discussed urinary retention with Dr Brunilda Payor, can go home with foley and follow up with Urology next week in office.  Patient will F/U with Dr. Lowella Fairy as scheduled and with  Dr. Myra Gianotti PRN.  Clinton Gallant [facility] 08/29/2013 12:23 PM --  Laboratory Lab Results:  Recent Labs  08/27/13 0440 08/28/13 0450  WBC 12.1* 11.4*  HGB 10.4* 9.4*  HCT 34.7* 30.0*  PLT 240 169   BMET  Recent Labs  08/27/13 0440 08/28/13 0450  NA 141 139  K 3.9 4.0  CL 103 102  CO2 26 26  GLUCOSE 109* 130*  BUN 26* 30*  CREATININE 1.07  0.94  CALCIUM 8.0* 7.9*    COAG Lab Results  Component Value Date   INR 1.22 08/27/2013   INR 1.59* 08/22/2013   INR 1.15 05/04/2013   No results found for this basename: PTT

## 2013-08-29 NOTE — Progress Notes (Signed)
Patient Name: [name] DeckRobert D [name] Date of Encounter: 08/29/2013     Principal Problem:   Acute on chronic diastolic heart failure Active Problems:   Aortic dissection   Chronic systolic congestive heart failure   Acute on chronic diastolic CHF (congestive heart failure), NYHA class 3   Bradycardia    SUBJECTIVE  [name] MaduroRobert is a 73 year old gentleman with history of coronary artery disease, atrial fibrillation, aortic dissection and diastolic congestive heart failure. He recently presented to the hospital with volume overload and was thought to be in congestive heart failure. He was found to have another aortic dissection.  He's had his aortic dissection repair. His heart failure seems to be well-controlled.  CURRENT MEDS . amiodarone  400 mg Oral Daily  . aspirin EC  81 mg Oral Daily  . atorvastatin  10 mg Oral Daily  . atropine  0.4 mg Intravenous Once  . docusate sodium  100 mg Oral Daily  . enalapril  10 mg Oral Daily  . enoxaparin (LOVENOX) injection  30 mg Subcutaneous Q24H  . furosemide  40 mg Intravenous BID  . hydrALAZINE  25 mg Oral 4 times per day  . loratadine  10 mg Oral Daily  . metoprolol tartrate  25 mg Oral BID  . multivitamin with minerals  1 tablet Oral Daily  . pantoprazole  40 mg Oral Daily  . potassium chloride  40 mEq Oral Daily  . sodium chloride  3 mL Intravenous Q12H  . tamsulosin  0.4 mg Oral Daily    OBJECTIVE  Filed Vitals:   08/28/13 2113 08/29/13 0045 08/29/13 0539 08/29/13 0930  BP: 97/32 117/33 114/42 156/44  Pulse: 70 62 67 78  Temp: 99.3 F (37.4 C)  99.4 F (37.4 C)   TempSrc: Oral  Oral   Resp: 18  18   Height:      Weight:   182 lb 3.2 oz (82.645 kg)   SpO2: 90%  93% 97%    Intake/Output Summary (Last 24 hours) at 08/29/13 1119 Last data filed at 08/29/13 0700  Gross per 24 hour  Intake    960 ml  Output    875 ml  Net     85 ml   Filed Weights   08/27/13 0600 08/28/13 0600 08/29/13 0539  Weight: 178 lb 2.1 oz (80.8 kg)  180 lb 12.4 oz (82 kg) 182 lb 3.2 oz (82.645 kg)    PHYSICAL EXAM  General: Pleasant, NAD. Neuro: Alert and oriented X 3. Moves all extremities spontaneously. Psych: Normal affect. HEENT:  Normal  Neck: Supple without bruits or JVD. Lungs:  Resp regular and unlabored, CTA. Heart: RR, normal S1 , crisp S2  Abdomen: Soft, non-tender, non-distended, BS + x 4.  Extremities: No clubbing, cyanosis or edema. DP/PT/Radials 2+ and equal bilaterally.  Accessory Clinical Findings  CBC  Recent Labs  08/27/13 0440 08/28/13 0450  WBC 12.1* 11.4*  HGB 10.4* 9.4*  HCT 34.7* 30.0*  MCV 94.0 92.6  PLT 240 169   Basic Metabolic Panel  Recent Labs  08/27/13 0440 08/28/13 0450  NA 141 139  K 3.9 4.0  CL 103 102  CO2 26 26  GLUCOSE 109* 130*  BUN 26* 30*  CREATININE 1.07 0.94  CALCIUM 8.0* 7.9*   Hemoglobin A1C  Recent Labs  08/27/13 0440  HGBA1C 6.1*    TELE  NSR  ECG     ASSESSMENT AND PLAN Mr. [name] [name] is a 73 year old gentleman with h/o aortic dissection, PAF  and CAD who presented to [facility] on 08/22/13 with acute on chronic HF exacerbation. On 02/19/2009 presented with a large ascending aneurysm and acute aortic insufficiency and aortic arch aneurysm and at that time underwent aortic valve replacement replacement of aortic root with tissue valve and ascending aorta and replacement of aortic arch to the left subclavian artery. Since that time he's had an acute myocardial infarction and had a coronary stent placed. In November 2014 prior to his yearly followup [facility] with an acute type III aortic dissection. He was treated medically, during the hospital stay he did develop persistent atrial fibrillation and ultimately was discharged home on anticoagulation.  He was in his usual state of health until ~10 days ago when he was treated with steroid injection for sinus infection. Since that time progressive swelling and dyspnea not responding to  increased po lasix. 10-15 pound weight gain. Came to ED and found to be in HF. CXR suggestive of worsening dissection. CT obtained stable aortic root repair with dilatation of the aorta distal to the isthmus up to 6 cm with chronic dissection. He was seen by Dr. Tyrone Sage and now s/p carotid to subclavian bypass.  Carotid-subclavian bypass and thoracic stent graft insertion. Post op day #3. Continue ASA.   Bradycardia- on 3/7 the patient was bradycardic with a HR 20s-30s and with long QT, amiodarone, lopressor and cardizem all held. This resolved and amiodarone resumed on 3/8 as it was felt that he was at risk for periop PAF. His blood pressure was not adequately controlled and was at high risk with aortic dissection so lopressor was added back on 3/9.  HTN - well controlled ACE I/Hydralazine and beta blocker.  Have also added back enalapril   Acute diastolic CHF- diuresing well on Lasix IV. 732 mL net negative today. Weight down 9lbs. Creat normal.   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 Eliquis on hold for now post op. Resume when CTCS deems safe.   Episode of VT/VF- His VT event occurred in the midst of a surgical procedure which included manipulation of the aorta, carotids, and subclavian vessels. His K was 2.9 at the time. EP consulted and recommended conservative tx with observation, K and Mag supplementation and discontinuation of amiodarone. Amiodarone was never discontinued for some unknown reason and has received 400mg  qd ever since the day after his surgery. Per Dr. Johney Frame, we will decrease dose to 200mg  qd.   Urinary retention- Postoperatively he had difficulty voiding and was in and out catheterized. He was started on Flomax. Since he continued to have difficulty voiding Dr. Tyrone Sage  ordered an indwelling Foley catheter. Can leave with foley and follow up as an outpatient if leaves today. Otherwise voiding trial Monday if he is still in the hospital per urology.   Fall- Per nurse and patients wife patient fell going to bathroom.He got twisted up in his gown and caught himself against the wall in the bathroom. He denies dizziness or blackouts.   Signed, Thereasa Parkin PA-C   Attending Note:   The patient was seen and examined.  Agree with assessment and plan as noted above.  Changes made to the above note as needed.  Discussed with Dr. Chauncey Cruel ( PA for vascular)  Patient wife.  He has lots of issues but seem stable enough to go home. Will need a follow up visit this week.   Vesta Mixer, Montez Hageman., MD, [facility] 08/29/2013, 1:13 PM  er [phone]

## 2013-08-29 NOTE — Discharge Summary (Signed)
Discharge Summary   Patient ID: [name] [name] MRN: [id number], DOB/AGE: February 11, 1941 73 y.o. Admit date: 08/22/2013 D/C date:     08/29/2013  Primary Cardiologist: Dr. Eldridge DaceVaranasi   Principal Problem:   Acute on chronic diastolic heart failure Active Problems:   HTN (hypertension)   Aortic dissection   Hypokalemia   Hyperlipidemia   Atrial fibrillation with rapid ventricular response   Chronic systolic congestive heart failure   Acute on chronic diastolic CHF (congestive heart failure), NYHA class 3   Bradycardia   Admission Dates: 08/22/13-08/29/13 Discharge Diagnosis: Acute on chronic diastolic heart failure in the setting of aortic dissection: Discharge weight 182lbs   HPI: Mr. [name] [name] is a 73 year old gentleman with h/o aortic dissection s/p repair and AVR, PAF on eliquis and amiodarone and CAD s/p stenting, who [facility] with acute on chronic diastolic CHF exacerbation.  On 02/19/2009 he presented with a large ascending aneurysm and acute aortic insufficiency and aortic arch aneurysm and at that time underwent aortic valve replacement replacement of aortic root with tissue valve and ascending aorta and replacement of aortic arch to the left subclavian artery. Since that time he's had an acute myocardial infarction and had a coronary stent placed. In November 2014 prior to his yearly followup he presented to the [place] with an acute type III aortic dissection. He was treated medically and during the hospital stay he developed persistent atrial fibrillation and ultimately was discharged home on anticoagulation and amiodarone. He was in his usual state of health until ~10 days ago when he was treated with steroid injection for sinus infection. Since that time he had progressive swelling and dyspnea that did not respond to increased po lasix. He a 10-15 pound weight gain and presented to the ED and found to be in HF. CXR suggestive of worsening dissection. CT obtained stable  aortic root repair with dilatation of the aorta distal to the isthmus up to 6 cm with chronic dissection. He was seen by Dr. Tyrone SageGerhardt and now s/p carotid to subclavian bypass.  Carotid-subclavian bypass and thoracic stent graft insertion. Post op day #3. Continue ASA.   Bradycardia- on 3/7 the patient was bradycardic with a HR 20s-30s and with long QT, amiodarone, lopressor and cardizem all held. This resolved and amiodarone resumed on 3/8 as it was felt that he was at risk for periop PAF. His blood pressure was not adequately controlled and was at high risk with aortic dissection so lopressor was added back on 3/9.   HTN - well controlled ACE I/Hydralazine and beta blocker. Have also added back enalapril   Acute on chronic diastolic CHF- diuresing well on Lasix IV. 732 mL net negative today. Weight down 9lbs. Creat normal. 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 Continue ACEi and BB.  PAF maintaining NSR - continue Amio 200mg . Eliquis resumed today as cleared by CTCS.  Episode of VT/VF- His VT event occurred in the midst of a surgical procedure which included manipulation of the aorta, carotids, and subclavian vessels. His K was 2.9 at the time. EP consulted and recommended conservative tx with observation, K and Mag supplementation and discontinuation of amiodarone. Amiodarone was never discontinued for some unknown reason and has received 400mg  qd ever since the day after  his surgery. Per Dr. Johney Frame, we will decrease dose to 200mg  qd.   Urinary retention- Postoperatively he had difficulty voiding and was in and out catheterized. He was started on Flomax. Since he continued to have difficulty voiding Dr. Tyrone Sage ordered an indwelling Foley catheter. He will leave with foley in place and follow up as an outpatient with  urology on Monday.   The patient has had a complicated hospital course and has many issues but is felt stable enough to go home with close outpatient follow up. The patient has been seen by Dr. Elease Hashimoto today and deemed safe for discharge home. All follow-up appointments have been scheduled. Discharge medications include amiodarone 200mg , eliquis 5mg  BID, ASA, lipitor 10mg , enalapril 10mg , Lasix 40 BID, Kdur 20 mEq BID, hydralazine 25mg  q 6hrs, lopressor 25mg  BID, NTG, Flowmax .4mg ,    Discharge Vitals: Blood pressure 122/48, pulse 56, temperature 99.4 F (37.4 C), temperature source Oral, resp. rate 20, height 5\' 10"  (1.778 m), weight 182 lb 3.2 oz (82.645 kg), SpO2 98.00%.  Labs: Lab Results  Component Value Date   WBC 11.4* 08/28/2013   HGB 9.4* 08/28/2013   HCT 30.0* 08/28/2013   MCV 92.6 08/28/2013   PLT 169 08/28/2013    Recent Labs Lab 08/23/13 0334  08/28/13 0450  NA 140  < > 139  K 5.6*  < > 4.0  CL 102  < > 102  CO2 23  < > 26  BUN 27*  < > 30*  CREATININE 1.12  < > 0.94  CALCIUM 8.5  < > 7.9*  PROT 6.0  --   --   BILITOT 0.6  --   --   ALKPHOS 74  --   --   ALT 44  --   --   AST 63*  --   --   GLUCOSE 111*  < > 130*  < > = values in this interval not displayed.   Diagnostic Studies/Procedures      Discharge Medications     Medication List    ASK your doctor about these medications       amiodarone 400 MG tablet  Commonly known as:  PACERONE  Take 1 tablet (400 mg total) by mouth daily.     aspirin EC 81 MG tablet  Take 81 mg by mouth daily.     atorvastatin 10 MG tablet  Commonly known as:  LIPITOR  Take 1 tablet (10 mg total) by mouth daily.     diltiazem 240 MG 24 hr capsule  Commonly known as:  CARDIZEM CD  Take 1 capsule (240 mg total) by mouth 2 (two) times daily.     ELIQUIS 5 MG Tabs tablet  Generic drug:  apixaban  Take 5 mg by mouth 2 (two) times daily.     enalapril 5 MG tablet  Commonly known as:  VASOTEC  Take 5 mg by mouth daily.       furosemide 40 MG tablet  Commonly known as:  LASIX  Take 1 tablet (40 mg total) by mouth 2 (two) times daily.     lansoprazole 30 MG capsule  Commonly known as:  PREVACID  Take 30 mg by mouth daily.     loratadine 10 MG tablet  Commonly known as:  CLARITIN  Take 10 mg by mouth daily.     metoprolol 100 MG tablet  Commonly known as:  LOPRESSOR  Take 1 tablet (100 mg total) by mouth 2 (two) times daily.  multivitamin with minerals Tabs tablet  Take 1 tablet by mouth daily.     nitroGLYCERIN 0.4 MG SL tablet  Commonly known as:  NITROSTAT  Place 0.4 mg under the tongue every 5 (five) minutes as needed.     potassium chloride SA 20 MEQ tablet  Commonly known as:  K-DUR,KLOR-CON  Take 2 tablets (40 mEq total) by mouth daily.        Disposition   The patient will be discharged in stable condition to home.  Future Appointments Provider Department Dept Phone   09/04/2013 8:30 AM Everette Rank, MD [facility] [phone]   09/04/2013 8:40 AM [facility] [place] Office [phone]   09/18/2013 11:30 AM [facility] [place] [facility] AT [facility] [phone]   Liquids only 4 hours prior to your exam. Any medications can be taken as usual. Please arrive 15 min prior to your scheduled exam time.   09/18/2013 1:00 PM Delight Ovens, MD [facility] [phone]     Follow-up Information   Follow up with [name]., MD On 09/04/2013. (@ 8:30 am - please arrive at 8:15 for labwork. Nothing to eat after midnight the night before)    Specialty:  Cardiology   Contact information:   [address] [place] [address] [phone]       Follow up with Vernell Barrier, NP On 09/01/2013. (@ 10:45am for your foley catheter removal (Dr. Foster Simpson with urology) )    Specialty:  Urology   Contact information:   [address] [place]  [address] [phone]         Duration of Discharge Encounter: Greater than 30 minutes including physician and PA time.  Urban Gibson PA-C 08/29/2013, 3:23 PM   Attending Note:   The patient was seen and examined.  Agree with assessment and plan as noted above.  Changes made to the above note as needed.  Patient has had a very complex hospitalization. He originally presented with heart failure but was noticed to have an aortic dissection. The aortic dissection has been repaired. He is heart failure symptoms have resolved.    Vesta Mixer, Montez Hageman., MD, [facility] 08/29/2013, 6:54 PM

## 2013-08-29 NOTE — Progress Notes (Addendum)
[address]       [place] [address]             [phone]      3 Days Post-Op  Procedure(s) (LRB): BYPASS GRAFT CAROTID-SUBCLAVIAN (Left)  THORACIC STENT GRAFT INSERTION (N/A) Subjective: Feels well  Objective  Telemetry sinus rhythm  Temp:  [98.3 F (36.8 C)-99.4 F (37.4 C)] 99.4 F (37.4 C) (03/13 0539) Pulse Rate:  [51-78] 67 (03/13 0539) Resp:  [18-31] 18 (03/13 0539) BP: (97-142)/(32-42) 114/42 mmHg (03/13 0539) SpO2:  [90 %-98 %] 93 % (03/13 0539) Weight:  [182 lb 3.2 oz (82.645 kg)] 182 lb 3.2 oz (82.645 kg) (03/13 0539)   Intake/Output Summary (Last 24 hours) at 08/29/13 0822 Last data filed at 08/28/13 1630  Gross per 24 hour  Intake   1080 ml  Output    885 ml  Net    195 ml       General appearance: alert, cooperative and no distress Heart: regular rate and rhythm Lungs: dim in left base Abdomen: benign Extremities: no edema, left radial pulse strong Wound: incis healing well  Lab Results:  Recent Labs  08/27/13 0440 08/28/13 0450  NA 141 139  K 3.9 4.0  CL 103 102  CO2 26 26  GLUCOSE 109* 130*  BUN 26* 30*  CREATININE 1.07 0.94  CALCIUM 8.0* 7.9*   No results found for this basename: AST, ALT, ALKPHOS, BILITOT, PROT, ALBUMIN,  in the last 72 hours No results found for this basename: LIPASE, AMYLASE,  in the last 72 hours  Recent Labs  08/27/13 0440 08/28/13 0450  WBC 12.1* 11.4*  HGB 10.4* 9.4*  HCT 34.7* 30.0*  MCV 94.0 92.6  PLT 240 169   No results found for this basename: CKTOTAL, CKMB, TROPONINI,  in the last 72 hours No components found with this basename: POCBNP,  No results found for this basename: DDIMER,  in the last 72 hours  Recent Labs  08/27/13 0440  HGBA1C 6.1*   No results found for this basename: CHOL, HDL, LDLCALC, TRIG, CHOLHDL,  in the last 72 hours No results found for this basename: TSH, T4TOTAL, FREET3, T3FREE, THYROIDAB,  in the last 72 hours No results found for this  basename: VITAMINB12, FOLATE, FERRITIN, TIBC, IRON, RETICCTPCT,  in the last 72 hours  Medications: Scheduled . amiodarone  400 mg Oral Daily  . aspirin EC  81 mg Oral Daily  . atorvastatin  10 mg Oral Daily  . atropine  0.4 mg Intravenous Once  . docusate sodium  100 mg Oral Daily  . enalapril  10 mg Oral Daily  . enoxaparin (LOVENOX) injection  30 mg Subcutaneous Q24H  . furosemide  40 mg Intravenous BID  . hydrALAZINE  25 mg Oral 4 times per day  . loratadine  10 mg Oral Daily  . metoprolol tartrate  25 mg Oral BID  . multivitamin with minerals  1 tablet Oral Daily  . pantoprazole  40 mg Oral Daily  . potassium chloride  40 mEq Oral Daily  . sodium chloride  3 mL Intravenous Q12H  . tamsulosin  0.4 mg Oral Daily     Radiology/Studies:  Dg Chest Port 1 View  08/28/2013   CLINICAL DATA Carotid subclavian bypass.  Thoracic stent graft  EXAM PORTABLE CHEST - 1 VIEW  COMPARISON 08/26/2013  FINDINGS Right jugular sheath in the right innominate vein.  Improvement in bilateral edema. Left lower lobe atelectasis and  left effusion unchanged. Aortic stent graft across the aortic arch is unchanged in position.  IMPRESSION Improvement in pulmonary edema.  Left lower lobe atelectasis and effusion remain.  SIGNATURE  Electronically Signed   By: Marlan Palauharles  Clark M.D.   On: 08/28/2013 08:09    INR: Will add last result for INR, ABG once components are confirmed Will add last 4 CBG results once components are confirmed  Assessment/Plan: S/P Procedure(s) (LRB): BYPASS GRAFT CAROTID-SUBCLAVIAN (Left)  THORACIC STENT GRAFT INSERTION (N/A)  1 doing well 2 foley in place- on flomax, plans as outlined per urology 3 BP well controlled, only one reading of systolic>140- mostly in 1teens 4 no new labs today 5 cont routine rehab  LOS: 7 days    GOLD,WAYNE E 3/13/20158:22 AM  Per nurse and patients wife patient fell going to bathroom, was not alone, did not hit head, no obvious injury Close to  D/C from surgical point of view  D/C planning per cardiology as patient was admitted for CHF symptoms, still needs resolution of anticoagulation (eliquis prior to admission) on Cordarone (question to stop per Dr Tillie RungAlred), and diuretic BP meds  High risk for early readmission for CHF so will need close medical follow up Discussed urinary retention with Dr Brunilda PayorNesi, can go home with foley and follow up with Urology next week in office. I have seen and examined Vista Deckobert D Krikorian and agree with the above assessment  and plan.  Delight OvensEdward B Elisah Parmer MD [facility] [phone] Office [phone] 08/29/2013 10:22 AM

## 2013-08-29 NOTE — Progress Notes (Signed)
[id number] Cardiac Rehab Pt for discharge. Completed CHF education with pt and wife. We discussed use of IS, daily weights, sodium and fluid restrictions and when to call MD and 911. Pt voices understanding. Pt agrees to Outpt. CRP in [place], will send referral. Beatrix Fetters, RN 08/29/2013 3:44 PM

## 2013-08-29 NOTE — Op Note (Signed)
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  SURGICAL PROCEDURE:  Left subclavian to carotid bypass with 8 mm graft and ligation of proximal left subclavian artery.  Endovascular repair of descending thoracic aorta with coverage of the left subclavian and distal extension with a proximal 40 x 40 x 20 GORE-TEX device with a second extension 45 x 45 x 15 cm and aortogram x3.  CO-SURGEONS: 1. Sheliah PlaneEdward Quanta Roher, MD, Thoracic Surgery. 2. [name] ChinaV. Wells Brabham IV, MD; Vascular Surgery.  BRIEF HISTORY:  The patient is a 73 year old male who 4 years previously presented in respiratory distress with severe aortic insufficiency and a 7 cm ascending aorta extending into the arch.  At that time, a biologic Bental was done  with tissue valve and replacement of his ascending aorta and arch to the left subclavian was performed.  A bifurcated graft was brought off the distal ascending aorta to the innominate   and to the left carotid artery. Since he  had myocardial infarction and stent placed in the circumflex coronary artery.  In November of this year, 1 week prior to his regularly scheduled yearly CT of the aorta, he developed chest pain and was found to have a type 3 aortic dissection originating distal to the left subclavian  artery.  This was initially treated medically and a followup scan was scheduled for February when because of the snow he did not make it for the scan. A week  later, his wife noted he was having increasing swelling in his feet and ankles, increasing shortness of breath, and was admitted by Cardiology for congestive heart failure.  A followup scan, chest x-ray suggested that his aortic knob had enlarged even compared to November.  A repeat CTA of the chest, abdomen, and pelvis, and brain and neck was performed revealing enlargement from 4.8 to almost 6 cm in the proximal descending aorta with re-opacification of the false lumen that extended but did not extend entire length to the thoracic aorta and did not involve the femorals.  The patient's right vertebral was small and nondominant, the left was large.  The patient's congestive heart failure status was improved with diuresis and was recommended to the patient to proceed with placement of a stent graft into the old ascending aorta and arch graft and a carotid subclavian bypass to allow coverage of the left subclavian artery and preserved flow to the left vertebral.  Risks and options of this were discussed with the patient.  He was agreeable and signed informed consent.  The procedure was done with co-surgeons Dr. Myra [name] of Vascular Surgery and Dr. Tyrone [name] of Thoracic Surgery.  DESCRIPTION OF PROCEDURE:  The patient underwent general endotracheal anesthesia without incident.  Appropriate  time-out was performed.  The initial portion of the procedure by Dr.  Myra Gianotti in the left subclavian area, an incision was made and described in detail in his note.  A left subclavian-carotid bypass was performed and the left subclavian artery was ligated proximally before the takeoff of the vertebral and the mammary.  We then proceeded with placement of the GORE intraluminal covered stent device. Because of previous bypasses and lymphoceles in the right groin, the left femoral artery was selected.  Ultrasound guidance, needle stick, and guidewire were placed into the left femoral artery.  A small  sheath was then placed over the wire into the femoral artery to allow access. The pigtail catheter was advanced.  Two ProGlide with access to the artery obtained. Two ProGlide devices were placed in the left femoral artery for later closure.  An Amplatz wire under fluoroscopic guidance was passed into the ascending aorta.  A 24-French dilator and sheath were then placed into the femoral artery and well positioned.  A second buddy wire was placed for a pigtail catheter for aortogram into the superior vena cava.  The patient had been previously heparinized at the time of the subclavian bypass, so additional heparin was administered. A nonselective aortogram of the aortic arch was then performed confirming the location of the takeoff of the ascending graft to the subclavian and carotid.  A 40 x 40 x 20 graft was then positioned and deployed proximally right at the takeoff of the graft to the cerebral vessels with the landing zone in the old graft.  A second aortogram was then performed.  Then, a second device was deployed inside the first device with approximately 5 cm overlap, the second device was 45 x 45 x 15.  A trilobed balloon was then passed over guidewire and the distal landing zone, proximal landing zone, and overlap were then dilated.  A completion aortogram was then performed which showed no evidence of endovascular leak and good positioning of the device.  The device was then removed without consequence and ProGlide closures completed.  While closing the neck incision, the patient remained hemodynamically stable but did have several runs of nonsustained VT.  The patient was already on amiodarone, lidocaine bolus was administered and potassium was noted to be depleted and was replaced.  He had no further arrhythmias and incision was closed.  The patient was extubated in the operating room. The sponge and needle count was reported as correct.  He tolerated the procedure without  obvious complication.  At the completion of the procedure, he had a easily palpable pulse in the left arm and pedal pulses.     Sheliah Plane, MD     EG/MEDQ  D:  08/28/2013  T:  08/28/2013  Job:  [id number]

## 2013-08-29 NOTE — Progress Notes (Signed)
3 Days Post-Op Subjective: Patient reports No pain.  Objective: Vital signs in last 24 hours: Temp:  [98.3 F (36.8 C)-99.4 F (37.4 C)] 99.4 F (37.4 C) (03/13 0539) Pulse Rate:  [51-78] 67 (03/13 0539) Resp:  [18-31] 18 (03/13 0539) BP: (97-142)/(32-42) 114/42 mmHg (03/13 0539) SpO2:  [90 %-98 %] 93 % (03/13 0539) Weight:  [82.645 kg (182 lb 3.2 oz)] 82.645 kg (182 lb 3.2 oz) (03/13 0539)  Intake/Output from previous day: 03/12 0701 - 03/13 0700 In: 1080 [P.O.:1080] Out: 885 [Urine:875; Drains:10] Intake/Output this shift:    Physical Exam:  Foley draining clear urine. Urinary output: 885 ml/ 8 hours   Lab Results:  Recent Labs  08/26/13 1855 08/27/13 0440 08/28/13 0450  HGB 11.9* 10.4* 9.4*  HCT 35.0* 34.7* 30.0*   BMET  Recent Labs  08/27/13 0440 08/28/13 0450  NA 141 139  K 3.9 4.0  CL 103 102  CO2 26 26  GLUCOSE 109* 130*  BUN 26* 30*  CREATININE 1.07 0.94  CALCIUM 8.0* 7.9*    Recent Labs  08/27/13 0440  INR 1.22   No results found for this basename: LABURIN,  in the last 72 hours Results for orders placed during the hospital encounter of 08/22/13  MRSA PCR SCREENING     Status: None   Collection Time    08/22/13  8:21 PM      Result Value Ref Range Status   MRSA by PCR NEGATIVE  NEGATIVE Final   Comment:            The GeneXpert MRSA Assay (FDA     approved for NASAL specimens     only), is one component of a     comprehensive MRSA colonization     surveillance program. It is not     intended to diagnose MRSA     infection nor to guide or     monitor treatment for     MRSA infections.  SURGICAL PCR SCREEN     Status: None   Collection Time    08/25/13  7:50 PM      Result Value Ref Range Status   MRSA, PCR NEGATIVE  NEGATIVE Final   Staphylococcus aureus NEGATIVE  NEGATIVE Final   Comment:            The Xpert SA Assay (FDA     approved for NASAL specimens     in patients over 74 years of age),     is one component of     a  comprehensive surveillance     program.  Test performance has     been validated by [facility] for patients greater     than or equal to 74 year old.     It is not intended     to diagnose infection nor to     guide or monitor treatment.    Studies/Results: Dg Chest Port 1 View  08/28/2013   CLINICAL DATA Carotid subclavian bypass.  Thoracic stent graft  EXAM PORTABLE CHEST - 1 VIEW  COMPARISON 08/26/2013  FINDINGS Right jugular sheath in the right innominate vein.  Improvement in bilateral edema. Left lower lobe atelectasis and left effusion unchanged. Aortic stent graft across the aortic arch is unchanged in position.  IMPRESSION Improvement in pulmonary edema.  Left lower lobe atelectasis and effusion remain.  SIGNATURE  Electronically Signed   By: Marlan Palauharles  Clark M.D.   On: 08/28/2013 08:09    Assessment/Plan:  Urinary retention  Continue Flomax  Patient can go home with Foley if discharged before Monday.    Otherwise voiding trial Monday if he is still in the hospital  Follow-up as outpatient.   LOS: 7 days   Lindaann Slough 08/29/2013, 8:05 AM

## 2013-08-30 NOTE — Progress Notes (Signed)
Pt/family given discharge instructions, medication lists, follow up appointments, and when to call the doctor.  Pt/family verbalizes understanding. Pt given signs and symptoms of infection. Divina Neale McClintock, RN    

## 2013-09-01 ENCOUNTER — Telehealth: Payer: Self-pay | Admitting: Interventional Cardiology

## 2013-09-01 DIAGNOSIS — R339 Retention of urine, unspecified: Secondary | ICD-10-CM | POA: Diagnosis not present

## 2013-09-01 NOTE — Telephone Encounter (Signed)
Follow up     Calling to talk to [name] [name]----pt thinks he missed her call earlier

## 2013-09-01 NOTE — Telephone Encounter (Signed)
No answer no vm

## 2013-09-01 NOTE — Telephone Encounter (Signed)
New message    Wife [name] calling stating patient was recently discharge from hospital has a standing appt on  3/19 . Patient wife stated she need to R.S this appt due to time. Offer an appt with PA / NPA wife refused .     Advise patient a message will be sent around to nurse.

## 2013-09-02 NOTE — Telephone Encounter (Signed)
Called pts wife back yesterday and moved 8:30 appt to 10:30am.

## 2013-09-04 ENCOUNTER — Ambulatory Visit: Payer: Medicare Other | Admitting: Interventional Cardiology

## 2013-09-04 ENCOUNTER — Other Ambulatory Visit: Payer: Medicare Other

## 2013-09-04 ENCOUNTER — Encounter: Payer: Self-pay | Admitting: Interventional Cardiology

## 2013-09-04 ENCOUNTER — Ambulatory Visit (INDEPENDENT_AMBULATORY_CARE_PROVIDER_SITE_OTHER): Payer: Medicare Other | Admitting: Interventional Cardiology

## 2013-09-04 ENCOUNTER — Ambulatory Visit: Payer: Medicare Other | Admitting: Cardiothoracic Surgery

## 2013-09-04 VITALS — BP 142/62 | HR 66 | Ht 70.0 in | Wt 176.0 lb

## 2013-09-04 DIAGNOSIS — I7100 Dissection of unspecified site of aorta: Secondary | ICD-10-CM

## 2013-09-04 DIAGNOSIS — G4700 Insomnia, unspecified: Secondary | ICD-10-CM

## 2013-09-04 DIAGNOSIS — E785 Hyperlipidemia, unspecified: Secondary | ICD-10-CM | POA: Diagnosis not present

## 2013-09-04 DIAGNOSIS — I2510 Atherosclerotic heart disease of native coronary artery without angina pectoris: Secondary | ICD-10-CM | POA: Diagnosis not present

## 2013-09-04 DIAGNOSIS — I4891 Unspecified atrial fibrillation: Secondary | ICD-10-CM

## 2013-09-04 DIAGNOSIS — R609 Edema, unspecified: Secondary | ICD-10-CM

## 2013-09-04 HISTORY — DX: Insomnia, unspecified: G47.00

## 2013-09-04 MED ORDER — ZOLPIDEM TARTRATE 5 MG PO TABS: 5.0000 mg | ORAL_TABLET | Freq: Every evening | ORAL | Status: DC | PRN

## 2013-09-04 NOTE — Progress Notes (Signed)
Patient ID: [name] [name], male   DOB: May 21, 1941, 73 y.o.   MRN: [id number]    [address] [place], [place]  [address] Phone: [phone] Fax:  [phone]  Date:  09/04/2013   ID:  [name], [name] 1940/09/20, MRN [id number]  PCP:  [name]., MD      History of Present Illness: [name] [name] is a 73 y.o. male with h/o aortic disection.  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  His recovery has been slow.  He has had urinary retention requiring a catheter,  He had post op VT that was treated conservatively.  Overall,he is weak.  He cannot sleep at night.  He is using a walker and moves around slowly.     Wt Readings from Last 3 Encounters:  09/04/13 176 lb (79.833 kg)  08/29/13 182 lb 3.2 oz (82.645 kg)  08/29/13 182 lb 3.2 oz (82.645 kg)     Past Medical History  Diagnosis Date  . Hyperlipidemia   . DVT (deep venous thrombosis)     post knee surgery  . Tobacco abuse   . HTN (hypertension)   . Descending aortic aneurysm   . Aortic valve insufficiency   . Thoracic aneurysm   . S/P AVR (aortic valve replacement)   . CAD (coronary artery disease)   . Atrial flutter     Current Outpatient Prescriptions  Medication Sig Dispense Refill  . amiodarone (PACERONE) 200 MG tablet Take 1 tablet (200 mg total) by mouth daily.  30 tablet  3  . apixaban (ELIQUIS) 5 MG TABS tablet Take 5 mg by mouth 2 (two) times daily.      Marland Kitchen aspirin EC 81 MG tablet Take 81 mg by mouth daily.        Marland Kitchen atorvastatin (LIPITOR) 10 MG tablet Take 1 tablet (10 mg total) by mouth daily.  90 tablet  3  . enalapril (VASOTEC) 10 MG tablet Take 1 tablet (10 mg total) by mouth daily.  30 tablet  6  . furosemide (LASIX) 40 MG tablet Take 1 tablet (40 mg total) by mouth 2 (two) times daily.  60 tablet  11  . hydrALAZINE (APRESOLINE) 25 MG tablet Take 1 tablet (25 mg total) by mouth every 6 (six) hours.  120 tablet  3  . lansoprazole  (PREVACID) 30 MG capsule Take 30 mg by mouth daily.       Marland Kitchen loratadine (CLARITIN) 10 MG tablet Take 10 mg by mouth daily.        . metoprolol (LOPRESSOR) 25 MG tablet Take 1 tablet (25 mg total) by mouth 2 (two) times daily.  60 tablet  6  . Multiple Vitamin (MULTIVITAMIN WITH MINERALS) TABS tablet Take 1 tablet by mouth daily.      . nitroGLYCERIN (NITROSTAT) 0.4 MG SL tablet Place 0.4 mg under the tongue every 5 (five) minutes as needed.        . potassium chloride SA (K-DUR,KLOR-CON) 20 MEQ tablet Take 2 tablets (40 mEq total) by mouth daily.  60 tablet  11  . tamsulosin (FLOMAX) 0.4 MG CAPS capsule Take 0.4 mg by mouth. Take 2 daily       No current facility-administered medications for this visit.    Allergies:    Allergies  Allergen Reactions  . Lortab [Hydrocodone-Acetaminophen] Hives  . Morphine And Related Hives    Social History:  The patient  reports that he quit smoking  about 39 years ago. His smoking use included Cigarettes. He smoked 0.00 packs per day. He has never used smokeless tobacco. He reports that he does not drink alcohol or use illicit drugs.   Family History:  The patient's family history is not on file.   ROS:  Please see the history of present illness.  No nausea, vomiting.  No fevers, chills.  No focal weakness.  No dysuria. Weak; unable to urinate-has catheter.   All other systems reviewed and negative.   PHYSICAL EXAM: VS:  BP 142/62  Pulse 66  Ht 5\' 10"  (1.778 m)  Wt 176 lb (79.833 kg)  BMI 25.25 kg/m2 Well nourished, well developed, in no acute distress HEENT: normal Neck: no JVD, no carotid bruits Cardiac:  normal S1, S2; RRR;  Lungs:  clear to auscultation bilaterally, no wheezing, rhonchi or rales Abd: soft, nontender, no hepatomegaly Ext: bilateral lower extremity edema; bruising on low back form fall Skin: warm and dry, bruising at surgical site Neuro:   no focal abnormalities noted      ASSESSMENT AND PLAN:  1) aortic dissection:  Continue aggressive blood pressure control. Blood pressure is very well controlled. S/p stent to thoracic aorta and carotid-subclavian bypass 2). atrial fibrillation: Continue anticoagulation for stroke prevention. Continue amiodarone for rhythm control. No signs of bradycardia. Back on Eliquis.  Appears to be in NSR at this time.   Postpone lipid test until he is feeling better.  Insomnia: WIll give one month supply of ambien.  This has not been a long term problem in the past.   Edema: elevate legs. Do not want to increase diuretics at this time given his urinary issues.  Hopefully, his catheter can come out soon.     Signed, Fredric MareJay S. Taiki Buckwalter, MD, [facility] 09/04/2013 11:25 AM

## 2013-09-04 NOTE — Patient Instructions (Addendum)
Your physician recommends that you return for a FASTING lipid profile: order given to you to take to [facility].   Your physician recommends that you continue on your current medications as directed. Please refer to the Current Medication list given to you today.  Your physician recommends that you schedule a follow-up appointment in: 3 months with [name].

## 2013-09-11 DIAGNOSIS — R300 Dysuria: Secondary | ICD-10-CM | POA: Diagnosis not present

## 2013-09-11 DIAGNOSIS — R339 Retention of urine, unspecified: Secondary | ICD-10-CM | POA: Diagnosis not present

## 2013-09-12 ENCOUNTER — Telehealth: Payer: Self-pay | Admitting: Interventional Cardiology

## 2013-09-12 NOTE — Telephone Encounter (Signed)
New problem   Pt stated her husband still has a cathter and she need the FMLA forms to have her out until May 2 because she has to take care of him. Please call pt is any questions.

## 2013-09-12 NOTE — Telephone Encounter (Addendum)
Noted, [name] has form and he will fill out asap.

## 2013-09-15 NOTE — Telephone Encounter (Deleted)
Pt.notified

## 2013-09-18 ENCOUNTER — Ambulatory Visit (INDEPENDENT_AMBULATORY_CARE_PROVIDER_SITE_OTHER): Payer: Medicare Other | Admitting: Cardiothoracic Surgery

## 2013-09-18 ENCOUNTER — Encounter: Payer: Self-pay | Admitting: Cardiothoracic Surgery

## 2013-09-18 ENCOUNTER — Telehealth: Payer: Self-pay | Admitting: Interventional Cardiology

## 2013-09-18 ENCOUNTER — Other Ambulatory Visit: Payer: Medicare Other

## 2013-09-18 VITALS — BP 97/40 | HR 68 | Temp 97.4°F | Resp 20 | Ht 70.0 in | Wt 176.0 lb

## 2013-09-18 DIAGNOSIS — I719 Aortic aneurysm of unspecified site, without rupture: Secondary | ICD-10-CM

## 2013-09-18 DIAGNOSIS — Z09 Encounter for follow-up examination after completed treatment for conditions other than malignant neoplasm: Secondary | ICD-10-CM

## 2013-09-18 MED ORDER — HYDRALAZINE HCL 25 MG PO TABS: 12.5000 mg | ORAL_TABLET | Freq: Four times a day (QID) | ORAL | Status: DC

## 2013-09-18 NOTE — Telephone Encounter (Signed)
FMLA Mailed to Pt, pt aware 4.2.15/kdm

## 2013-09-18 NOTE — Progress Notes (Signed)
Patient ID: [name] [name], male   DOB: 03-12-1941, 73 y.o.   MRN: [id number]          [name] [name]  [facility] #[id number] Date of Birth: 03-12-1941  Referring: Corky CraftsVaranasi, Jayadeep S, MD Primary Care: Corky CraftsVARANASI,JAYADEEP S., MD  Chief Complaint:    Chief Complaint  Patient presents with  . Routine Post Op    F/U from surgery S/P Lt Subclavian carotid bypass, Endovascular repair of descending thoracic aorta on 08/28/13  08/28/2013  DATE OF DISCHARGE:  OPERATIVE REPORT  PREOPERATIVE DIAGNOSIS: Expanding proximal descending aortic aneurysm  following type 3 aortic dissection in November 2014.  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  SURGICAL PROCEDURE: Left subclavian to carotid bypass with 8 mm graft  and ligation of proximal left subclavian artery. Endovascular repair of  descending thoracic aorta with coverage of the left subclavian and  distal extension with a proximal 40 x 40 x 20 GORE-TEX device with a  second extension 45 x 45 x 15 cm and aortogram x3.   History of Present Illness:     Patient is a 73 year old male who on 02/19/2009 presented with a large ascending aneurysm and acute aortic insufficiency and aortic arch aneurysm and at that time underwent aortic valve replacement replacement of aortic root and ascending aorta and replacement of aortic arch to the left subclavian artery. Since that time he's had an acute myocardial infarction and had a stent placed. In November 2014 prior to his yearly followup he [facility] with an acute type III aortic dissection. He was treated medically, there is hospital stay he did develop persistent atrial fibrillation and ultimately was discharged home on anticoagulation.   Prior to his followup scan for the type III aortic dissection he was really admitted with signs and symptoms of congestive heart failure  followup scan showed significant enlargement of the type III aortic dissection. March 12 he underwent left subclavian to carotid bypass ligation of the proximal left subclavian artery an endovascular repair of descending thoracic aorta with coverage of the left subclavian artery. He returns to the office today for followup. He continues to have urinary problems his Flomax dose has been doubled and he still has a Foley catheter in place. He notes that the swelling and lower extremity edema is much improved. His wife notes this blood pressure at times is 90 to 100 systolic.  Current Activity/ Functional Status: Mobility/Ambulation: Independent with mobility prior to admission in the home on all surfaces with no assistive devices for unlimited distances.   Zubrod Score: At the time of surgery this patient's most appropriate activity status/level should be described as: []  Normal activity, no symptoms []  Symptoms, fully ambulatory [x]  Symptoms, in bed less than or equal to 50% of the time []  Symptoms, in bed greater than 50% of the time but less than 100% []  Bedridden []  Moribund   Past Medical History  Diagnosis Date  . Hyperlipidemia   . DVT (deep venous thrombosis)     post knee surgery  . Tobacco abuse   . HTN (hypertension)   . Descending aortic aneurysm   . Aortic valve insufficiency   . Thoracic aneurysm   . S/P AVR (aortic valve replacement)   . CAD (coronary artery disease)   . Atrial flutter     Past Surgical History  Procedure Laterality Date  . Right groin lymphocele  [id number]  . Aortic valve  replacement  [id number]  . Replacement total knee      bilateral  . Carotid-subclavian bypass graft Left 08/26/2013    Procedure: BYPASS GRAFT CAROTID-SUBCLAVIAN;  Surgeon: Nada Libman, MD;  Location: [facility] OR;  Service: Vascular;  Laterality: Left;  . Endovascular stent insertion N/A 08/26/2013    Procedure:  THORACIC STENT GRAFT INSERTION;  Surgeon: Nada Libman, MD;  Location:  [facility] OR;  Service: Vascular;  Laterality: N/A;    History  Smoking status  . Former Smoker  . Types: Cigarettes  . Quit date: 04/16/1974  Smokeless tobacco  . Never Used   History  Alcohol Use No      Allergies  Allergen Reactions  . Lortab [Hydrocodone-Acetaminophen] Hives  . Morphine And Related Hives    Current Outpatient Prescriptions  Medication Sig Dispense Refill  . amiodarone (PACERONE) 200 MG tablet Take 1 tablet (200 mg total) by mouth daily.  30 tablet  3  . apixaban (ELIQUIS) 5 MG TABS tablet Take 5 mg by mouth 2 (two) times daily.      Marland Kitchen aspirin EC 81 MG tablet Take 81 mg by mouth daily.        Marland Kitchen atorvastatin (LIPITOR) 10 MG tablet Take 1 tablet (10 mg total) by mouth daily.  90 tablet  3  . enalapril (VASOTEC) 10 MG tablet Take 1 tablet (10 mg total) by mouth daily.  30 tablet  6  . furosemide (LASIX) 40 MG tablet Take 1 tablet (40 mg total) by mouth 2 (two) times daily.  60 tablet  11  . hydrALAZINE (APRESOLINE) 25 MG tablet Take 1 tablet (25 mg total) by mouth every 6 (six) hours.  120 tablet  3  . lansoprazole (PREVACID) 30 MG capsule Take 30 mg by mouth daily.       Marland Kitchen loratadine (CLARITIN) 10 MG tablet Take 10 mg by mouth daily.        . metoprolol (LOPRESSOR) 25 MG tablet Take 1 tablet (25 mg total) by mouth 2 (two) times daily.  60 tablet  6  . Multiple Vitamin (MULTIVITAMIN WITH MINERALS) TABS tablet Take 1 tablet by mouth daily.      . nitroGLYCERIN (NITROSTAT) 0.4 MG SL tablet Place 0.4 mg under the tongue every 5 (five) minutes as needed.        . potassium chloride SA (K-DUR,KLOR-CON) 20 MEQ tablet Take 2 tablets (40 mEq total) by mouth daily.  60 tablet  11  . tamsulosin (FLOMAX) 0.4 MG CAPS capsule Take 0.4 mg by mouth. Take 2 daily      . zolpidem (AMBIEN) 5 MG tablet Take 1 tablet (5 mg total) by mouth at bedtime as needed for sleep.  30 tablet  0   No current facility-administered medications for this visit.       Review of Systems:     Review of Systems  Constitutional: Nofever and malaise/fatigue. Negative for chills and weight loss.  HENT: Positive for nosebleeds. In the past but since discharge in November has had no further nosebleeds  Eyes: Negative.   Respiratory: Negative for hemoptysis, sputum production and wheezing.   Cardiovascular: Positive for palpitations and leg swelling. Negative for chest pain, orthopnea, claudication and PND.  Gastrointestinal: Negative.   Genitourinary: Negative.   Musculoskeletal: Positive for myalgias and joint pain.  Skin: Negative.   Neurological: Positive for dizziness and weakness. Negative for tingling, tremors, sensory change, speech change, focal weakness, seizures and loss of consciousness.  Endo/Heme/Allergies: Negative.   Psychiatric/Behavioral:  Negative.     Physical Exam: BP 97/40  Pulse 68  Temp(Src) 97.4 F (36.3 C) (Oral)  Resp 20  Ht 5\' 10"  (1.778 m)  Wt 176 lb (79.833 kg)  BMI 25.25 kg/m2  SpO2 97%  General appearance: alert, cooperative, appears older than stated age, fatigued, no distress and slowed mentation Neurologic: intact Heart: regular rate and rhythm, S1, S2 normal, no murmur, click, rub or gallop, normal apical impulse, no click and no rub Lungs: clear to auscultation bilaterally Abdomen: soft, non-tender; bowel sounds normal; no masses,  no organomegaly Extremities: extremities normal, atraumatic, no cyanosis or edema, mild  edema, redness or tenderness in the calves or thighs and no ulcers, gangrene or trophic changes Wound: The left neck incision is well-healed, has a strong probable left brachial and radial pulses  The left groin puncture site is well-healed   Diagnostic Studies & Laboratory data:     Recent Radiology Findings:   Ct Angio Head W/cm &/or Wo Cm  08/22/2013   CLINICAL DATA:  73 year old male with history of thoracic aneurysm status post repair. Progressive weight gain, swelling and shortness of Breath. Recently thought to have  sinus infection, treated with antibiotics and steroids. Symptoms increasing. Initial encounter.  EXAM: CT ANGIOGRAPHY HEAD AND NECK  TECHNIQUE: Multidetector CT imaging of the head and neck was performed using the standard protocol during bolus administration of intravenous contrast. Multiplanar CT image reconstructions and MIPs were obtained to evaluate the vascular anatomy. Carotid stenosis measurements (when applicable) are obtained utilizing NASCET criteria, using the distal internal carotid diameter as the denominator.  CONTRAST:  66mL OMNIPAQUE IOHEXOL 350 MG/ML SOLN  COMPARISON:  Chest abdomen and pelvis CTA from the same day reported separately.  FINDINGS: CTA HEAD FINDINGS  Calvarium intact. Negative scalp soft tissues. Cerebral volume is within normal limits for age. No midline shift, ventriculomegaly, mass effect, evidence of mass lesion, intracranial hemorrhage or evidence of cortically based acute infarction. Gray-white matter differentiation is within normal limits throughout the brain. No abnormal enhancement identified.  VASCULAR FINDINGS:  Major intracranial venous structures are enhancing.  Dominant distal left vertebral artery is patent without stenosis despite some calcified plaque. Retrograde supply to the non dominant distal right vertebral artery. Right PICA and AICA are patent. Dominant left AICA. The basilar artery is patent without stenosis. SCA and PCA origins are within normal limits. Normal left posterior communicating artery, the right is diminutive or absent. Bilateral PCA branches are within normal limits.  Some cavernous sinus venous contrast artifact is present. Both ICA siphons are patent. Ophthalmic and posterior communicating arteries are within normal limits. Normal carotid termini. Normal MCA and ACA origins. Anterior communicating artery and bilateral ACA branches are within normal limits. Bilateral MCA branches are within normal limits.  Review of the MIP images confirms the  above findings.  CTA NECK FINDINGS  Chest findings are reported separately (please see that report). Negative thyroid, larynx, pharynx, parapharyngeal spaces, retropharyngeal space. Extensive dental hardware artifact. Negative visualized sublingual space, submandibular glands and submandibular space. Negative parotid glands. No acute orbits soft tissue findings.  No cervical lymphadenopathy. Mucous retention cyst left maxillary sinus. Other Visualized paranasal sinuses and mastoids are clear. No acute osseous abnormality identified. Degenerative changes in the cervical spine.  VASCULAR FINDINGS:  SEE CHEST CTA FINDINGS FROM TODAY REPORTED SEPARATELY.  Both internal jugular veins are patent. The left subclavian and innominate vein are patent. Negative non contrast appearance of the right subclavian. The right innominate vein is patent.  [place]  vessel origins are patent, dolichoectatic. Widespread mild soft and calcified circumferential plaque in the great vessels.  Patent, mildly dolichoectatic right CCA. Patent right carotid bifurcation with no right ICA stenosis despite calcified plaque. Tortuous, mildly dolichoectatic cervical right ICA.  No proximal right subclavian artery stenosis, with some dolichoectasia. Non dominant right vertebral artery is occluded at its origin. Distal right vertebral artery flow is reconstituted briefly at the C2-C3 level, but then re- occludes below the skullbase. Intracranial findings detailed above.  Patent, the dolichoectatic left CCA. Calcified plaque at the left carotid bifurcation which is significantly affected by dental streak artifact. Loss of enhancement in the left ICA origin and bulb felt to be artifactual (see series 12, image 172). Tortuous/dolichoectatic but otherwise negative cervical left ICA be on that level.  No proximal left subclavian artery stenosis. Dominant left vertebral artery origin widely patent. Tortuous proximal left vertebral artery. Dominant left vertebral  artery tortuous and patent to the skullbase.  Review of the MIP images confirms the above findings.  IMPRESSION: 1. Abnormal thoracic aorta, see chest CTA from today reported separately. 2. Patent, dolichoectatic great vessels without arterial dissection in the neck. 3. Occluded non dominant right vertebral artery. Patent, dominant left vertebral artery. 4. Suspect artifactual decreased enhancement of the left ICA origin, related to severe dental hardware artifact. Absence of any proximal left ICA stenosis could be confirmed with either carotid Doppler or neck MRA if necessary. 5. Distal right vertebral artery supplied in a retrograde fashion from the left. Intracranial atherosclerosis, but no intracranial stenosis and otherwise negative intracranial CTA. 6.  No acute intracranial abnormality.   Electronically Signed   By: Augusto Gamble M.D.   On: 08/22/2013 12:26     CLINICAL DATA: Chest pain radiating to the back.  EXAM:  CT ANGIOGRAPHY CHEST WITH CONTRAST  TECHNIQUE:  Multidetector CT imaging of the chest was performed using the  standard protocol during bolus administration of intravenous  contrast. Multiplanar CT image reconstructions including MIPs were  obtained to evaluate the vascular anatomy.  CONTRAST: OMNIPAQUE IOHEXOL 350 MG/ML SOLN  COMPARISON: CTA of the chest performed 05/30/2012  FINDINGS:  The thoracic aorta is dilated to 4.5 cm in maximal diameter, perhaps  slightly more prominent than on the prior study. There is a new  dissection flap arising from the distal aspect of the aortic arch,  with a single fenestration at the proximal aspect of the dissection,  and decreased enhancement of blood within the false lumen. The  distal aspect of the false lumen appears to be fully clotted; the  false lumen resolves just proximal to the level of the diaphragm.  This causes mild luminal narrowing along the descending thoracic  aorta.  The patient is status post ascending aortic  repair; postoperative  change is unremarkable in appearance. The great vessels are  unremarkable in appearance. There is no evidence of pulmonary  embolus.  Mild left basilar atelectasis is noted. Minimal emphysematous change  is noted at the lung apices. The lungs are otherwise clear. There is  no evidence of pleural effusion or pneumothorax. No masses are  identified.  No mediastinal lymphadenopathy is appreciated. No pericardial  effusion is seen. The patient is status post median sternotomy. No  axillary lymphadenopathy is seen. The thyroid gland is unremarkable  in appearance.  The visualized portions of the liver and spleen are unremarkable. A  3.2 cm cyst is noted at the upper pole of the right kidney.  No acute osseous abnormalities are seen.  Mild degenerative change is  noted at the lower cervical spine.  Review of the MIP images confirms the above findings.  IMPRESSION:  1. New dissection flap arising from the distal aspect of the aortic  arch, extending along the descending thoracic aorta and resolving  just proximal to the level of the diaphragm. No evidence of  involvement of branch vessels. The distal aspect of the false lumen  appears to be fully clotted, while there is decreased enhancement of  blood within the more proximal false lumen, with a single  fenestration noted at the proximal aspect of the dissection.  Associated mild luminal narrowing along the descending thoracic  aorta. The thoracic aorta measures 4.5 cm in maximal diameter,  perhaps slightly more prominent than on the prior study.  2. Ascending thoracic aorta repair is unremarkable in appearance.  3. No evidence of pulmonary embolus.  4. Mild left basilar atelectasis noted; minimal emphysematous change  at the lung apices. Lungs otherwise clear.  5. Right renal cyst noted.  These results were called by telephone at the time of interpretation  on 05/04/2013 at 4:10 AM to Dr. Brandt Loosen , who verbally    acknowledged these results.  Electronically Signed  By: Roanna Raider M.D.  On: 05/04/2013 04:27   Recent Lab Findings: Lab Results  Component Value Date   WBC 11.4* 08/28/2013   HGB 9.4* 08/28/2013   HCT 30.0* 08/28/2013   PLT 169 08/28/2013   GLUCOSE 130* 08/28/2013   CHOL  Value: 136        ATP III CLASSIFICATION:  <200     mg/dL   Desirable  161-096  mg/dL   Borderline High  >=045    mg/dL   High        09/25/8117   TRIG 89 07/29/2010   HDL 38* 07/29/2010   LDLCALC  Value: 80        Total Cholesterol/HDL:CHD Risk Coronary Heart Disease Risk Table                     Men   Women  1/2 Average Risk   3.4   3.3  Average Risk       5.0   4.4  2 X Average Risk   9.6   7.1  3 X Average Risk  23.4   11.0        Use the calculated Patient Ratio above and the CHD Risk Table to determine the patient's CHD Risk.        ATP III CLASSIFICATION (LDL):  <100     mg/dL   Optimal  147-829  mg/dL   Near or Above                    Optimal  130-159  mg/dL   Borderline  562-130  mg/dL   High  >865     mg/dL   Very High 7/84/6962   ALT 44 08/23/2013   AST 63* 08/23/2013   NA 139 08/28/2013   K 4.0 08/28/2013   CL 102 08/28/2013   CREATININE 0.94 08/28/2013   BUN 30* 08/28/2013   CO2 26 08/28/2013   TSH 1.102 05/08/2013   INR 1.22 08/27/2013   HGBA1C 6.1* 08/27/2013      Assessment / Plan:   Patient status post left subclavian carotid bypass ligation of subclavian artery an endovascular repair of a chronic type III aortic dissection , with previous biologic Bent[place] and arch replacement . Patient's blood  pressure is slightly low in the office today , I discussed with his wife decreasing the Apresoline to 12.5 mg every 6 hours rather than 25 . If his pressure maintenance less than 100 she will contact cardiology office for further instructions about blood pressure medications .  Plan to see the patient back in 3 months with a followup CTA of the chest and abdomen to evaluate the endovascular repair of his type III  aortic dissection.  Delight Ovens MD 09/18/2013 1:12 PM

## 2013-09-18 NOTE — Telephone Encounter (Signed)
Form filled out and gave to medical records to scan. Selena Batten will have pt pick up form as pts wife will still need to sign.

## 2013-09-23 ENCOUNTER — Encounter: Payer: Self-pay | Admitting: Cardiothoracic Surgery

## 2013-09-26 ENCOUNTER — Telehealth: Payer: Self-pay

## 2013-09-26 NOTE — Telephone Encounter (Addendum)
Spoke with pts wife and pt takes [place] every night. It has helped him sleep. Pt tried not to take it last night and he had trouble sleeping. Pts wife thinks everything will get better after the catheter is removed. [name], pt only has 4-5 tablets left. Is it okay to refill?

## 2013-09-29 NOTE — Telephone Encounter (Signed)
Ok to refill for 30 days only

## 2013-09-30 MED ORDER — ZOLPIDEM TARTRATE 5 MG PO TABS: 5.0000 mg | ORAL_TABLET | Freq: Every evening | ORAL | Status: DC | PRN

## 2013-09-30 NOTE — Telephone Encounter (Signed)
Called into pharmacy

## 2013-10-02 DIAGNOSIS — R339 Retention of urine, unspecified: Secondary | ICD-10-CM | POA: Diagnosis not present

## 2013-10-03 DIAGNOSIS — N390 Urinary tract infection, site not specified: Secondary | ICD-10-CM | POA: Diagnosis not present

## 2013-10-03 DIAGNOSIS — N401 Enlarged prostate with lower urinary tract symptoms: Secondary | ICD-10-CM | POA: Diagnosis not present

## 2013-10-03 DIAGNOSIS — R339 Retention of urine, unspecified: Secondary | ICD-10-CM | POA: Diagnosis not present

## 2013-10-05 ENCOUNTER — Inpatient Hospital Stay (HOSPITAL_COMMUNITY)
Admission: EM | Admit: 2013-10-05 | Discharge: 2013-10-11 | DRG: 871 | Disposition: A | Discharge status: Home / Self Care | Payer: Medicare Other | Attending: Internal Medicine | Admitting: Internal Medicine

## 2013-10-05 ENCOUNTER — Encounter (HOSPITAL_COMMUNITY): Payer: Self-pay | Admitting: Emergency Medicine

## 2013-10-05 DIAGNOSIS — I498 Other specified cardiac arrhythmias: Secondary | ICD-10-CM | POA: Diagnosis not present

## 2013-10-05 DIAGNOSIS — D649 Anemia, unspecified: Secondary | ICD-10-CM | POA: Diagnosis not present

## 2013-10-05 DIAGNOSIS — I4892 Unspecified atrial flutter: Secondary | ICD-10-CM | POA: Diagnosis present

## 2013-10-05 DIAGNOSIS — Z86718 Personal history of other venous thrombosis and embolism: Secondary | ICD-10-CM | POA: Diagnosis not present

## 2013-10-05 DIAGNOSIS — Z888 Allergy status to other drugs, medicaments and biological substances status: Secondary | ICD-10-CM

## 2013-10-05 DIAGNOSIS — E872 Acidosis, unspecified: Secondary | ICD-10-CM | POA: Diagnosis not present

## 2013-10-05 DIAGNOSIS — I472 Ventricular tachycardia, unspecified: Secondary | ICD-10-CM | POA: Diagnosis not present

## 2013-10-05 DIAGNOSIS — I5033 Acute on chronic diastolic (congestive) heart failure: Secondary | ICD-10-CM | POA: Diagnosis not present

## 2013-10-05 DIAGNOSIS — A419 Sepsis, unspecified organism: Principal | ICD-10-CM

## 2013-10-05 DIAGNOSIS — I5030 Unspecified diastolic (congestive) heart failure: Secondary | ICD-10-CM | POA: Diagnosis present

## 2013-10-05 DIAGNOSIS — I739 Peripheral vascular disease, unspecified: Secondary | ICD-10-CM | POA: Diagnosis present

## 2013-10-05 DIAGNOSIS — G4700 Insomnia, unspecified: Secondary | ICD-10-CM | POA: Diagnosis not present

## 2013-10-05 DIAGNOSIS — Z885 Allergy status to narcotic agent status: Secondary | ICD-10-CM

## 2013-10-05 DIAGNOSIS — I252 Old myocardial infarction: Secondary | ICD-10-CM

## 2013-10-05 DIAGNOSIS — J9600 Acute respiratory failure, unspecified whether with hypoxia or hypercapnia: Secondary | ICD-10-CM | POA: Diagnosis not present

## 2013-10-05 DIAGNOSIS — K219 Gastro-esophageal reflux disease without esophagitis: Secondary | ICD-10-CM | POA: Diagnosis present

## 2013-10-05 DIAGNOSIS — Z954 Presence of other heart-valve replacement: Secondary | ICD-10-CM | POA: Diagnosis not present

## 2013-10-05 DIAGNOSIS — R339 Retention of urine, unspecified: Secondary | ICD-10-CM | POA: Diagnosis not present

## 2013-10-05 DIAGNOSIS — I7100 Dissection of unspecified site of aorta: Secondary | ICD-10-CM | POA: Diagnosis not present

## 2013-10-05 DIAGNOSIS — R6520 Severe sepsis without septic shock: Secondary | ICD-10-CM

## 2013-10-05 DIAGNOSIS — I10 Essential (primary) hypertension: Secondary | ICD-10-CM

## 2013-10-05 DIAGNOSIS — I2510 Atherosclerotic heart disease of native coronary artery without angina pectoris: Secondary | ICD-10-CM | POA: Diagnosis present

## 2013-10-05 DIAGNOSIS — I509 Heart failure, unspecified: Secondary | ICD-10-CM | POA: Diagnosis present

## 2013-10-05 DIAGNOSIS — Z87440 Personal history of urinary (tract) infections: Secondary | ICD-10-CM

## 2013-10-05 DIAGNOSIS — R911 Solitary pulmonary nodule: Secondary | ICD-10-CM | POA: Diagnosis not present

## 2013-10-05 DIAGNOSIS — I5042 Chronic combined systolic (congestive) and diastolic (congestive) heart failure: Secondary | ICD-10-CM | POA: Diagnosis present

## 2013-10-05 DIAGNOSIS — R001 Bradycardia, unspecified: Secondary | ICD-10-CM

## 2013-10-05 DIAGNOSIS — J984 Other disorders of lung: Secondary | ICD-10-CM | POA: Diagnosis not present

## 2013-10-05 DIAGNOSIS — I5022 Chronic systolic (congestive) heart failure: Secondary | ICD-10-CM

## 2013-10-05 DIAGNOSIS — E876 Hypokalemia: Secondary | ICD-10-CM | POA: Diagnosis present

## 2013-10-05 DIAGNOSIS — E785 Hyperlipidemia, unspecified: Secondary | ICD-10-CM

## 2013-10-05 DIAGNOSIS — J9819 Other pulmonary collapse: Secondary | ICD-10-CM | POA: Diagnosis not present

## 2013-10-05 DIAGNOSIS — J90 Pleural effusion, not elsewhere classified: Secondary | ICD-10-CM

## 2013-10-05 DIAGNOSIS — R059 Cough, unspecified: Secondary | ICD-10-CM | POA: Diagnosis present

## 2013-10-05 DIAGNOSIS — N390 Urinary tract infection, site not specified: Secondary | ICD-10-CM | POA: Diagnosis present

## 2013-10-05 DIAGNOSIS — R579 Shock, unspecified: Secondary | ICD-10-CM | POA: Diagnosis not present

## 2013-10-05 DIAGNOSIS — J189 Pneumonia, unspecified organism: Secondary | ICD-10-CM

## 2013-10-05 DIAGNOSIS — J811 Chronic pulmonary edema: Secondary | ICD-10-CM | POA: Diagnosis not present

## 2013-10-05 DIAGNOSIS — Z87891 Personal history of nicotine dependence: Secondary | ICD-10-CM

## 2013-10-05 DIAGNOSIS — I700 Atherosclerosis of aorta: Secondary | ICD-10-CM

## 2013-10-05 DIAGNOSIS — R0989 Other specified symptoms and signs involving the circulatory and respiratory systems: Secondary | ICD-10-CM | POA: Diagnosis present

## 2013-10-05 DIAGNOSIS — I4729 Other ventricular tachycardia: Secondary | ICD-10-CM | POA: Diagnosis not present

## 2013-10-05 DIAGNOSIS — J9601 Acute respiratory failure with hypoxia: Secondary | ICD-10-CM

## 2013-10-05 DIAGNOSIS — R899 Unspecified abnormal finding in specimens from other organs, systems and tissues: Secondary | ICD-10-CM | POA: Diagnosis not present

## 2013-10-05 DIAGNOSIS — R05 Cough: Secondary | ICD-10-CM | POA: Diagnosis present

## 2013-10-05 DIAGNOSIS — R6521 Severe sepsis with septic shock: Secondary | ICD-10-CM

## 2013-10-05 DIAGNOSIS — D509 Iron deficiency anemia, unspecified: Secondary | ICD-10-CM | POA: Diagnosis present

## 2013-10-05 DIAGNOSIS — Z952 Presence of prosthetic heart valve: Secondary | ICD-10-CM

## 2013-10-05 DIAGNOSIS — R509 Fever, unspecified: Secondary | ICD-10-CM | POA: Diagnosis not present

## 2013-10-05 DIAGNOSIS — R079 Chest pain, unspecified: Secondary | ICD-10-CM

## 2013-10-05 DIAGNOSIS — I5023 Acute on chronic systolic (congestive) heart failure: Secondary | ICD-10-CM | POA: Diagnosis present

## 2013-10-05 DIAGNOSIS — I4891 Unspecified atrial fibrillation: Secondary | ICD-10-CM | POA: Diagnosis not present

## 2013-10-05 DIAGNOSIS — Z7982 Long term (current) use of aspirin: Secondary | ICD-10-CM

## 2013-10-05 HISTORY — DX: Heart failure, unspecified: I50.9

## 2013-10-05 MED ORDER — IBUPROFEN 200 MG PO TABS
600.0000 mg | ORAL_TABLET | Freq: Once | ORAL | Status: AC
  Administered 2013-10-06: 600 mg via ORAL
  Filled 2013-10-05: qty 3

## 2013-10-05 NOTE — ED Notes (Signed)
Patient presents with c/o fever (2 extra strength Tylenol at home) of 102.2  Has been seeing the urologist and dx with uti and started on Cipro and Pyridium.

## 2013-10-06 ENCOUNTER — Inpatient Hospital Stay (HOSPITAL_COMMUNITY): Payer: Medicare Other

## 2013-10-06 ENCOUNTER — Emergency Department (HOSPITAL_COMMUNITY): Payer: Medicare Other

## 2013-10-06 ENCOUNTER — Encounter (HOSPITAL_COMMUNITY): Payer: Self-pay | Admitting: *Deleted

## 2013-10-06 ENCOUNTER — Observation Stay (HOSPITAL_COMMUNITY): Payer: Medicare Other

## 2013-10-06 DIAGNOSIS — A419 Sepsis, unspecified organism: Secondary | ICD-10-CM

## 2013-10-06 DIAGNOSIS — N390 Urinary tract infection, site not specified: Secondary | ICD-10-CM

## 2013-10-06 DIAGNOSIS — R339 Retention of urine, unspecified: Secondary | ICD-10-CM

## 2013-10-06 DIAGNOSIS — D649 Anemia, unspecified: Secondary | ICD-10-CM

## 2013-10-06 DIAGNOSIS — E785 Hyperlipidemia, unspecified: Secondary | ICD-10-CM

## 2013-10-06 DIAGNOSIS — Z954 Presence of other heart-valve replacement: Secondary | ICD-10-CM

## 2013-10-06 DIAGNOSIS — R6521 Severe sepsis with septic shock: Secondary | ICD-10-CM

## 2013-10-06 DIAGNOSIS — J9600 Acute respiratory failure, unspecified whether with hypoxia or hypercapnia: Secondary | ICD-10-CM

## 2013-10-06 DIAGNOSIS — R6520 Severe sepsis without septic shock: Secondary | ICD-10-CM

## 2013-10-06 DIAGNOSIS — E876 Hypokalemia: Secondary | ICD-10-CM

## 2013-10-06 DIAGNOSIS — I10 Essential (primary) hypertension: Secondary | ICD-10-CM

## 2013-10-06 DIAGNOSIS — I4891 Unspecified atrial fibrillation: Secondary | ICD-10-CM

## 2013-10-06 HISTORY — DX: Urinary tract infection, site not specified: N39.0

## 2013-10-06 HISTORY — DX: Retention of urine, unspecified: R33.9

## 2013-10-06 LAB — COMPREHENSIVE METABOLIC PANEL
ALBUMIN: 2.6 g/dL — AB (ref 3.5–5.2)
ALK PHOS: 90 U/L (ref 39–117)
ALT: 66 U/L — ABNORMAL HIGH (ref 0–53)
ALT: 77 U/L — AB (ref 0–53)
AST: 65 U/L — ABNORMAL HIGH (ref 0–37)
AST: 79 U/L — ABNORMAL HIGH (ref 0–37)
Albumin: 2.9 g/dL — ABNORMAL LOW (ref 3.5–5.2)
Alkaline Phosphatase: 86 U/L (ref 39–117)
BUN: 22 mg/dL (ref 6–23)
BUN: 23 mg/dL (ref 6–23)
CALCIUM: 8.1 mg/dL — AB (ref 8.4–10.5)
CHLORIDE: 99 meq/L (ref 96–112)
CO2: 23 meq/L (ref 19–32)
CO2: 25 mEq/L (ref 19–32)
CREATININE: 0.82 mg/dL (ref 0.50–1.35)
Calcium: 8.4 mg/dL (ref 8.4–10.5)
Chloride: 104 mEq/L (ref 96–112)
Creatinine, Ser: 0.83 mg/dL (ref 0.50–1.35)
GFR calc Af Amer: >90 mL/min (ref >90)
GFR calc Af Amer: >90 mL/min (ref >90)
GFR calc non Af Amer: 85 mL/min — ABNORMAL LOW (ref >90)
GFR, EST NON AFRICAN AMERICAN: 85 mL/min — AB (ref >90)
Glucose, Bld: 123 mg/dL — ABNORMAL HIGH (ref 70–99)
Glucose, Bld: 128 mg/dL — ABNORMAL HIGH (ref 70–99)
POTASSIUM: 3.6 meq/L — AB (ref 3.7–5.3)
Potassium: 3.3 mEq/L — ABNORMAL LOW (ref 3.7–5.3)
SODIUM: 138 meq/L (ref 137–147)
Sodium: 142 mEq/L (ref 137–147)
Total Bilirubin: 0.4 mg/dL (ref 0.3–1.2)
Total Bilirubin: 0.6 mg/dL (ref 0.3–1.2)
Total Protein: 5.8 g/dL — ABNORMAL LOW (ref 6.0–8.3)
Total Protein: 6.4 g/dL (ref 6.0–8.3)

## 2013-10-06 LAB — URINE MICROSCOPIC-ADD ON

## 2013-10-06 LAB — IRON AND TIBC
Iron: 15 ug/dL — ABNORMAL LOW (ref 42–135)
SATURATION RATIOS: 6 % — AB (ref 20–55)
TIBC: 249 ug/dL (ref 215–435)
UIBC: 234 ug/dL (ref 125–400)

## 2013-10-06 LAB — URINE CULTURE
Colony Count: NO GROWTH
Culture: NO GROWTH

## 2013-10-06 LAB — LACTATE DEHYDROGENASE: LDH: 246 U/L (ref 94–250)

## 2013-10-06 LAB — CBC WITH DIFFERENTIAL/PLATELET
BASOS ABS: 0 10*3/uL (ref 0.0–0.1)
BASOS PCT: 0 % (ref 0–1)
Basophils Absolute: 0 10*3/uL (ref 0.0–0.1)
Basophils Relative: 0 % (ref 0–1)
EOS ABS: 0.1 10*3/uL (ref 0.0–0.7)
EOS PCT: 1 % (ref 0–5)
Eosinophils Absolute: 0 10*3/uL (ref 0.0–0.7)
Eosinophils Relative: 1 % (ref 0–5)
HCT: 24.7 % — ABNORMAL LOW (ref 39.0–52.0)
HCT: 27.6 % — ABNORMAL LOW (ref 39.0–52.0)
Hemoglobin: 7.4 g/dL — ABNORMAL LOW (ref 13.0–17.0)
Hemoglobin: 8.3 g/dL — ABNORMAL LOW (ref 13.0–17.0)
LYMPHS ABS: 0.5 10*3/uL — AB (ref 0.7–4.0)
LYMPHS PCT: 7 % — AB (ref 12–46)
Lymphocytes Relative: 8 % — ABNORMAL LOW (ref 12–46)
Lymphs Abs: 0.4 10*3/uL — ABNORMAL LOW (ref 0.7–4.0)
MCH: 27.7 pg (ref 26.0–34.0)
MCH: 27.8 pg (ref 26.0–34.0)
MCHC: 30 g/dL (ref 30.0–36.0)
MCHC: 30.1 g/dL (ref 30.0–36.0)
MCV: 92 fL (ref 78.0–100.0)
MCV: 92.9 fL (ref 78.0–100.0)
MONO ABS: 0.5 10*3/uL (ref 0.1–1.0)
Monocytes Absolute: 0.5 10*3/uL (ref 0.1–1.0)
Monocytes Relative: 10 % (ref 3–12)
Monocytes Relative: 9 % (ref 3–12)
NEUTROS ABS: 3.9 10*3/uL (ref 1.7–7.7)
NEUTROS PCT: 83 % — AB (ref 43–77)
Neutro Abs: 5.1 10*3/uL (ref 1.7–7.7)
Neutrophils Relative %: 81 % — ABNORMAL HIGH (ref 43–77)
PLATELETS: 232 10*3/uL (ref 150–400)
Platelets: 198 10*3/uL (ref 150–400)
RBC: 2.66 MIL/uL — ABNORMAL LOW (ref 4.22–5.81)
RBC: 3 MIL/uL — AB (ref 4.22–5.81)
RDW: 17.8 % — AB (ref 11.5–15.5)
RDW: 17.8 % — ABNORMAL HIGH (ref 11.5–15.5)
WBC: 4.8 10*3/uL (ref 4.0–10.5)
WBC: 6.1 10*3/uL (ref 4.0–10.5)

## 2013-10-06 LAB — BLOOD GAS, ARTERIAL
Acid-base deficit: 6.6 mmol/L — ABNORMAL HIGH (ref 0.0–2.0)
Bicarbonate: 18.7 mEq/L — ABNORMAL LOW (ref 20.0–24.0)
DRAWN BY: 252031
O2 CONTENT: 4 L/min
O2 Saturation: 90.8 %
PCO2 ART: 44.1 mmHg (ref 35.0–45.0)
Patient temperature: 102.6
TCO2: 19.9 mmol/L (ref 0–100)
pH, Arterial: 7.266 — ABNORMAL LOW (ref 7.350–7.450)
pO2, Arterial: 84.9 mmHg (ref 80.0–100.0)

## 2013-10-06 LAB — RETICULOCYTES
RBC.: 2.89 MIL/uL — ABNORMAL LOW (ref 4.22–5.81)
Retic Count, Absolute: 66.5 10*3/uL (ref 19.0–186.0)
Retic Ct Pct: 2.3 % (ref 0.4–3.1)

## 2013-10-06 LAB — URINALYSIS, ROUTINE W REFLEX MICROSCOPIC
GLUCOSE, UA: NEGATIVE mg/dL
Hgb urine dipstick: NEGATIVE
KETONES UR: 15 mg/dL — AB
Nitrite: POSITIVE — AB
PROTEIN: 30 mg/dL — AB
Specific Gravity, Urine: 1.023 (ref 1.005–1.030)
Urobilinogen, UA: 1 mg/dL (ref 0.0–1.0)
pH: 5 (ref 5.0–8.0)

## 2013-10-06 LAB — TYPE AND SCREEN
ABO/RH(D): A POS
ANTIBODY SCREEN: NEGATIVE

## 2013-10-06 LAB — PROTIME-INR
INR: 1.74 — AB (ref 0.00–1.49)
PROTHROMBIN TIME: 19.8 s — AB (ref 11.6–15.2)

## 2013-10-06 LAB — FERRITIN: Ferritin: 318 ng/mL (ref 22–322)

## 2013-10-06 LAB — I-STAT CG4 LACTIC ACID, ED: Lactic Acid, Venous: 2.08 mmol/L (ref 0.5–2.2)

## 2013-10-06 MED ORDER — ASPIRIN EC 81 MG PO TBEC
81.0000 mg | DELAYED_RELEASE_TABLET | Freq: Every day | ORAL | Status: DC
  Filled 2013-10-06: qty 1

## 2013-10-06 MED ORDER — ATORVASTATIN CALCIUM 10 MG PO TABS
10.0000 mg | ORAL_TABLET | Freq: Every day | ORAL | Status: DC
  Administered 2013-10-06: 10 mg via ORAL
  Filled 2013-10-06 (×2): qty 1

## 2013-10-06 MED ORDER — METOCLOPRAMIDE HCL 5 MG/ML IJ SOLN
5.0000 mg | Freq: Once | INTRAMUSCULAR | Status: AC
  Administered 2013-10-06: 5 mg via INTRAVENOUS
  Filled 2013-10-06: qty 1

## 2013-10-06 MED ORDER — CIPROFLOXACIN IN D5W 400 MG/200ML IV SOLN
400.0000 mg | Freq: Once | INTRAVENOUS | Status: AC
  Administered 2013-10-06: 400 mg via INTRAVENOUS
  Filled 2013-10-06: qty 200

## 2013-10-06 MED ORDER — POTASSIUM CHLORIDE 10 MEQ/100ML IV SOLN
10.0000 meq | INTRAVENOUS | Status: AC
  Administered 2013-10-06 (×2): 10 meq via INTRAVENOUS
  Filled 2013-10-06 (×2): qty 100

## 2013-10-06 MED ORDER — ACETAMINOPHEN 650 MG RE SUPP
650.0000 mg | Freq: Four times a day (QID) | RECTAL | Status: DC | PRN
  Administered 2013-10-06: 650 mg via RECTAL
  Filled 2013-10-06 (×2): qty 1

## 2013-10-06 MED ORDER — NITROGLYCERIN 0.4 MG SL SUBL: 0.4000 mg | SUBLINGUAL_TABLET | SUBLINGUAL | Status: DC | PRN

## 2013-10-06 MED ORDER — ALBUTEROL SULFATE (2.5 MG/3ML) 0.083% IN NEBU
2.5000 mg | INHALATION_SOLUTION | Freq: Once | RESPIRATORY_TRACT | Status: AC
  Administered 2013-10-07: 2.5 mg via RESPIRATORY_TRACT
  Filled 2013-10-06: qty 3

## 2013-10-06 MED ORDER — MIDAZOLAM HCL 2 MG/2ML IJ SOLN
INTRAMUSCULAR | Status: AC
  Administered 2013-10-07: 2 mg via INTRAVENOUS
  Filled 2013-10-06: qty 2

## 2013-10-06 MED ORDER — METOCLOPRAMIDE HCL 5 MG/ML IJ SOLN
5.0000 mg | Freq: Once | INTRAMUSCULAR | Status: DC
  Filled 2013-10-06: qty 1

## 2013-10-06 MED ORDER — AMIODARONE HCL 200 MG PO TABS
200.0000 mg | ORAL_TABLET | Freq: Every day | ORAL | Status: DC
  Administered 2013-10-06 – 2013-10-11 (×6): 200 mg via ORAL
  Filled 2013-10-06 (×6): qty 1

## 2013-10-06 MED ORDER — CIPROFLOXACIN IN D5W 400 MG/200ML IV SOLN
400.0000 mg | Freq: Two times a day (BID) | INTRAVENOUS | Status: DC
  Administered 2013-10-06: 400 mg via INTRAVENOUS
  Filled 2013-10-06 (×2): qty 200

## 2013-10-06 MED ORDER — IOHEXOL 350 MG/ML SOLN
80.0000 mL | Freq: Once | INTRAVENOUS | Status: AC | PRN
  Administered 2013-10-06: 80 mL via INTRAVENOUS

## 2013-10-06 MED ORDER — IPRATROPIUM BROMIDE 0.02 % IN SOLN
0.5000 mg | Freq: Once | RESPIRATORY_TRACT | Status: AC
  Administered 2013-10-07: 0.5 mg via RESPIRATORY_TRACT
  Filled 2013-10-06: qty 2.5

## 2013-10-06 MED ORDER — PANTOPRAZOLE SODIUM 40 MG PO TBEC
40.0000 mg | DELAYED_RELEASE_TABLET | Freq: Every day | ORAL | Status: DC
  Administered 2013-10-06: 40 mg via ORAL
  Filled 2013-10-06: qty 1

## 2013-10-06 MED ORDER — FUROSEMIDE 10 MG/ML IJ SOLN
40.0000 mg | Freq: Once | INTRAMUSCULAR | Status: AC
  Administered 2013-10-06: 40 mg via INTRAVENOUS

## 2013-10-06 MED ORDER — IOHEXOL 300 MG/ML  SOLN
100.0000 mL | Freq: Once | INTRAMUSCULAR | Status: AC | PRN
  Administered 2013-10-06: 100 mL via INTRAVENOUS

## 2013-10-06 MED ORDER — ACETAMINOPHEN 325 MG PO TABS: 650.0000 mg | ORAL_TABLET | Freq: Four times a day (QID) | ORAL | Status: DC | PRN

## 2013-10-06 MED ORDER — FENTANYL CITRATE 0.05 MG/ML IJ SOLN
INTRAMUSCULAR | Status: AC
  Filled 2013-10-06: qty 2

## 2013-10-06 MED ORDER — VANCOMYCIN HCL 10 G IV SOLR
1250.0000 mg | Freq: Two times a day (BID) | INTRAVENOUS | Status: DC
  Administered 2013-10-06 – 2013-10-07 (×3): 1250 mg via INTRAVENOUS
  Filled 2013-10-06 (×7): qty 1250

## 2013-10-06 MED ORDER — FUROSEMIDE 40 MG PO TABS
40.0000 mg | ORAL_TABLET | Freq: Two times a day (BID) | ORAL | Status: DC
  Filled 2013-10-06 (×3): qty 1

## 2013-10-06 MED ORDER — TAMSULOSIN HCL 0.4 MG PO CAPS
0.4000 mg | ORAL_CAPSULE | Freq: Two times a day (BID) | ORAL | Status: DC
  Administered 2013-10-06 (×2): 0.4 mg via ORAL
  Filled 2013-10-06 (×4): qty 1

## 2013-10-06 MED ORDER — FUROSEMIDE 10 MG/ML IJ SOLN
INTRAMUSCULAR | Status: AC
  Filled 2013-10-06: qty 4

## 2013-10-06 MED ORDER — HYDRALAZINE HCL 25 MG PO TABS
25.0000 mg | ORAL_TABLET | Freq: Two times a day (BID) | ORAL | Status: DC
  Administered 2013-10-06 (×2): 25 mg via ORAL
  Filled 2013-10-06 (×4): qty 1

## 2013-10-06 MED ORDER — POTASSIUM CHLORIDE CRYS ER 20 MEQ PO TBCR
40.0000 meq | EXTENDED_RELEASE_TABLET | Freq: Every day | ORAL | Status: DC
  Administered 2013-10-06: 40 meq via ORAL
  Filled 2013-10-06 (×2): qty 2

## 2013-10-06 MED ORDER — ONDANSETRON HCL 4 MG/2ML IJ SOLN: 4.0000 mg | Freq: Four times a day (QID) | INTRAMUSCULAR | Status: DC | PRN

## 2013-10-06 MED ORDER — DEXTROSE 5 % IV SOLN
1.0000 g | Freq: Two times a day (BID) | INTRAVENOUS | Status: DC
  Administered 2013-10-06: 1 g via INTRAVENOUS
  Filled 2013-10-06 (×2): qty 1

## 2013-10-06 MED ORDER — PHENAZOPYRIDINE HCL 100 MG PO TABS
100.0000 mg | ORAL_TABLET | Freq: Three times a day (TID) | ORAL | Status: DC
  Administered 2013-10-06 (×3): 100 mg via ORAL
  Filled 2013-10-06 (×8): qty 1

## 2013-10-06 MED ORDER — ONDANSETRON HCL 4 MG PO TABS: 4.0000 mg | ORAL_TABLET | Freq: Four times a day (QID) | ORAL | Status: DC | PRN

## 2013-10-06 MED ORDER — ENALAPRIL MALEATE 10 MG PO TABS
10.0000 mg | ORAL_TABLET | Freq: Every day | ORAL | Status: DC
  Filled 2013-10-06: qty 1

## 2013-10-06 MED ORDER — METOPROLOL TARTRATE 25 MG PO TABS
25.0000 mg | ORAL_TABLET | Freq: Two times a day (BID) | ORAL | Status: DC
  Administered 2013-10-06 (×2): 25 mg via ORAL
  Filled 2013-10-06 (×4): qty 1

## 2013-10-06 MED ORDER — ZOLPIDEM TARTRATE 5 MG PO TABS: 5.0000 mg | ORAL_TABLET | Freq: Every evening | ORAL | Status: DC | PRN

## 2013-10-06 MED ORDER — APIXABAN 5 MG PO TABS
5.0000 mg | ORAL_TABLET | Freq: Two times a day (BID) | ORAL | Status: DC
  Filled 2013-10-06 (×2): qty 1

## 2013-10-06 MED ORDER — CEFTRIAXONE SODIUM 1 G IJ SOLR
1.0000 g | Freq: Once | INTRAMUSCULAR | Status: AC
  Administered 2013-10-06: 1 g via INTRAVENOUS
  Filled 2013-10-06: qty 10

## 2013-10-06 NOTE — ED Provider Notes (Signed)
CSN: [id number]     Arrival date & time 10/05/13  2259 History   First MD Initiated Contact with Patient 10/05/13 2330     Chief Complaint  Patient presents with  . Fever  . Urinary Tract Infection     (Consider location/radiation/quality/duration/timing/severity/associated sxs/prior Treatment) HPI Comments: Patient is 73 year old male with PMHx significant for DVT, HTN, aortic aneurysm, CAD, aortic valve replacement and most recently a carotid subclavian bypass graft on 3/10 - he developed post surgical urinary retention requiring a catheter, his wife states that he has been followed by urology for this and his catheter was removed on this past Thursday.  He has been able to urinate but has developed a UTI for which he was placed on Cephalexin 500mg  po TID.  His wife reports that since then he continues with fever, chills, rigors, and dysuria.  He denies hematuria, abdominal pain, cough, congestion, wound pain or drainage, nausea, vomiting or diarrhea.  Patient is a 73 y.o. male presenting with fever and urinary tract infection. The history is provided by the patient and the spouse. No language interpreter was used.  Fever Max temp prior to arrival:  103 Temp source:  Oral Severity:  Severe Onset quality:  Gradual Duration:  4 days Timing:  Constant Progression:  Worsening Chronicity:  New Relieved by:  Nothing Worsened by:  Nothing tried Ineffective treatments:  None tried Associated symptoms: chills, dysuria and myalgias   Associated symptoms: no chest pain, no confusion, no congestion, no cough, no diarrhea, no headaches, no nausea, no rash, no rhinorrhea, no sore throat and no vomiting   Risk factors: recent surgery   Urinary Tract Infection Associated symptoms include chills, a fever and myalgias. Pertinent negatives include no chest pain, congestion, coughing, headaches, nausea, rash, sore throat or vomiting.    Past Medical History  Diagnosis Date  . Hyperlipidemia   . DVT  (deep venous thrombosis)     post knee surgery  . Tobacco abuse   . HTN (hypertension)   . Descending aortic aneurysm   . Aortic valve insufficiency   . Thoracic aneurysm   . S/P AVR (aortic valve replacement)   . CAD (coronary artery disease)   . Atrial flutter    Past Surgical History  Procedure Laterality Date  . Right groin lymphocele  [id number]  . Aortic valve replacement  [id number]  . Replacement total knee      bilateral  . Carotid-subclavian bypass graft Left 08/26/2013    Procedure: BYPASS GRAFT CAROTID-SUBCLAVIAN;  Surgeon: Nada Libman, MD;  Location: [facility];  Service: Vascular;  Laterality: Left;  . Endovascular stent insertion N/A 08/26/2013    Procedure:  THORACIC STENT GRAFT INSERTION;  Surgeon: Nada Libman, MD;  Location: [facility] OR;  Service: Vascular;  Laterality: N/A;   History reviewed. No pertinent family history. History  Substance Use Topics  . Smoking status: Former Smoker    Types: Cigarettes    Quit date: 04/16/1974  . Smokeless tobacco: Never Used  . Alcohol Use: No    Review of Systems  Constitutional: Positive for fever and chills.  HENT: Negative for congestion, rhinorrhea and sore throat.   Respiratory: Negative for cough.   Cardiovascular: Negative for chest pain.  Gastrointestinal: Negative for nausea, vomiting and diarrhea.  Genitourinary: Positive for dysuria.  Musculoskeletal: Positive for myalgias.  Skin: Negative for rash.  Neurological: Negative for headaches.  Psychiatric/Behavioral: Negative for confusion.  All other systems reviewed and are negative.  Allergies  Lortab and Morphine and related  Home Medications   Prior to Admission medications   Medication Sig Start Date End Date Taking? Authorizing Provider  amiodarone (PACERONE) 200 MG tablet Take 1 tablet (200 mg total) by mouth daily. 08/29/13   Thereasa Parkin, PA-C  apixaban (ELIQUIS) 5 MG TABS tablet Take 5 mg by mouth 2 (two) times daily.    Historical Provider, MD   aspirin EC 81 MG tablet Take 81 mg by mouth daily.      Historical Provider, MD  atorvastatin (LIPITOR) 10 MG tablet Take 1 tablet (10 mg total) by mouth daily. 05/20/13   [name], MD  enalapril (VASOTEC) 10 MG tablet Take 1 tablet (10 mg total) by mouth daily. 08/29/13   Thereasa Parkin, PA-C  furosemide (LASIX) 40 MG tablet Take 1 tablet (40 mg total) by mouth 2 (two) times daily. 07/18/13   [name], MD  hydrALAZINE (APRESOLINE) 25 MG tablet Take 0.5 tablets (12.5 mg total) by mouth every 6 (six) hours. 09/18/13   Delight Ovens, MD  lansoprazole (PREVACID) 30 MG capsule Take 30 mg by mouth daily.     Historical Provider, MD  loratadine (CLARITIN) 10 MG tablet Take 10 mg by mouth daily.      Historical Provider, MD  metoprolol (LOPRESSOR) 25 MG tablet Take 1 tablet (25 mg total) by mouth 2 (two) times daily. 08/29/13   Thereasa Parkin, PA-C  Multiple Vitamin (MULTIVITAMIN WITH MINERALS) TABS tablet Take 1 tablet by mouth daily.    Historical Provider, MD  nitroGLYCERIN (NITROSTAT) 0.4 MG SL tablet Place 0.4 mg under the tongue every 5 (five) minutes as needed.      Historical Provider, MD  phenazopyridine (PYRIDIUM) 100 MG tablet Take 100 mg by mouth 3 (three) times daily as needed for pain.    Historical Provider, MD  potassium chloride SA (K-DUR,KLOR-CON) 20 MEQ tablet Take 2 tablets (40 mEq total) by mouth daily. 05/13/13   [name], MD  tamsulosin (FLOMAX) 0.4 MG CAPS capsule Take 0.4 mg by mouth. Take 2 daily    Historical Provider, MD  zolpidem (AMBIEN) 5 MG tablet Take 1 tablet (5 mg total) by mouth at bedtime as needed for sleep. 09/30/13   [name], MD   BP 118/44  Pulse 76  Temp(Src) 101.3 F (38.5 C) (Rectal)  Resp 33  Ht 5\' 10"  (1.778 m)  Wt 168 lb (76.204 kg)  BMI 24.11 kg/m2  SpO2 98% Physical Exam  Nursing note and vitals reviewed. Constitutional: He is oriented to person, place, and time. He appears well-developed and well-nourished.  No distress.  HENT:  Head: Normocephalic and atraumatic.  Right Ear: External ear normal.  Left Ear: External ear normal.  Nose: Nose normal.  Mouth/Throat: Oropharynx is clear and moist. No oropharyngeal exudate.  Eyes: Conjunctivae are normal. Pupils are equal, round, and reactive to light. No scleral icterus.  Neck: Normal range of motion. Neck supple.  Cardiovascular: Normal rate, regular rhythm and normal heart sounds.  Exam reveals no gallop and no friction rub.   No murmur heard. Pulmonary/Chest: Effort normal and breath sounds normal. No respiratory distress. He has no wheezes. He has no rales. He exhibits no tenderness.  Abdominal: Soft. Bowel sounds are normal. He exhibits no distension. There is no tenderness.  Musculoskeletal: Normal range of motion. He exhibits no edema and no tenderness.  Lymphadenopathy:    He has no cervical adenopathy.  Neurological: He is alert and oriented  to person, place, and time. He exhibits normal muscle tone. Coordination normal.  Skin: Skin is warm and dry. No rash noted. No erythema. There is pallor.  Psychiatric: He has a normal mood and affect. His behavior is normal. Judgment and thought content normal.    ED Course  Procedures (including critical care time) Labs Review Labs Reviewed  CBC WITH DIFFERENTIAL - Abnormal; Notable for the following:    RBC 3.00 (*)    Hemoglobin 8.3 (*)    HCT 27.6 (*)    RDW 17.8 (*)    Neutrophils Relative % 83 (*)    Lymphocytes Relative 7 (*)    Lymphs Abs 0.5 (*)    All other components within normal limits  COMPREHENSIVE METABOLIC PANEL - Abnormal; Notable for the following:    Potassium 3.6 (*)    Glucose, Bld 128 (*)    Albumin 2.9 (*)    AST 79 (*)    ALT 77 (*)    GFR calc non Af Amer 85 (*)    All other components within normal limits  URINALYSIS, ROUTINE W REFLEX MICROSCOPIC - Abnormal; Notable for the following:    Color, Urine ORANGE (*)    APPearance HAZY (*)    Bilirubin Urine  SMALL (*)    Ketones, ur 15 (*)    Protein, ur 30 (*)    Nitrite POSITIVE (*)    Leukocytes, UA MODERATE (*)    All other components within normal limits  URINE MICROSCOPIC-ADD ON - Abnormal; Notable for the following:    Bacteria, UA FEW (*)    Casts HYALINE CASTS (*)    All other components within normal limits  CULTURE, BLOOD (ROUTINE X 2)  CULTURE, BLOOD (ROUTINE X 2)  URINE CULTURE  I-STAT CG4 LACTIC ACID, ED    Imaging Review Dg Chest 2 View  10/06/2013   CLINICAL DATA:  Fever of 1020.2.  Recent urinary tract infection.  EXAM: CHEST  2 VIEW  COMPARISON:  DG CHEST 1V PORT dated 08/28/2013; CT ANGIO CHEST W/CM &/OR WO/CM dated 08/22/2013; DG CHEST 1V PORT dated 08/26/2013  FINDINGS: Postoperative changes in the mediastinum with aortic valve replacement and aortic stent graft. Aortic shadow remains enlarged but appears similar to previous studies. Left pleural effusion with basilar atelectasis is similar to prior study. Right lung is clear. Cardiac enlargement with normal pulmonary vascularity.  IMPRESSION: Stable appearance of postoperative changes in the mediastinum. Cardiac enlargement. Left pleural effusion with basilar atelectasis. These changes appear stable since previous study appear   Electronically Signed   By: Burman Nieves M.D.   On: 10/06/2013 00:44     EKG Interpretation None      Results for orders placed during the hospital encounter of 10/05/13  CBC WITH DIFFERENTIAL      Result Value Ref Range   WBC 6.1  4.0 - 10.5 K/uL   RBC 3.00 (*) 4.22 - 5.81 MIL/uL   Hemoglobin 8.3 (*) 13.0 - 17.0 g/dL   HCT 25.8 (*) 52.7 - 78.2 %   MCV 92.0  78.0 - 100.0 fL   MCH 27.7  26.0 - 34.0 pg   MCHC 30.1  30.0 - 36.0 g/dL   RDW 42.3 (*) 53.6 - 14.4 %   Platelets 232  150 - 400 K/uL   Neutrophils Relative % 83 (*) 43 - 77 %   Neutro Abs 5.1  1.7 - 7.7 K/uL   Lymphocytes Relative 7 (*) 12 - 46 %   Lymphs Abs  0.5 (*) 0.7 - 4.0 K/uL   Monocytes Relative 9  3 - 12 %   Monocytes  Absolute 0.5  0.1 - 1.0 K/uL   Eosinophils Relative 1  0 - 5 %   Eosinophils Absolute 0.0  0.0 - 0.7 K/uL   Basophils Relative 0  0 - 1 %   Basophils Absolute 0.0  0.0 - 0.1 K/uL  COMPREHENSIVE METABOLIC PANEL      Result Value Ref Range   Sodium 138  137 - 147 mEq/L   Potassium 3.6 (*) 3.7 - 5.3 mEq/L   Chloride 99  96 - 112 mEq/L   CO2 23  19 - 32 mEq/L   Glucose, Bld 128 (*) 70 - 99 mg/dL   BUN 23  6 - 23 mg/dL   Creatinine, Ser 1.61  0.50 - 1.35 mg/dL   Calcium 8.4  8.4 - 09.6 mg/dL   Total Protein 6.4  6.0 - 8.3 g/dL   Albumin 2.9 (*) 3.5 - 5.2 g/dL   AST 79 (*) 0 - 37 U/L   ALT 77 (*) 0 - 53 U/L   Alkaline Phosphatase 90  39 - 117 U/L   Total Bilirubin 0.6  0.3 - 1.2 mg/dL   GFR calc non Af Amer 85 (*) >90 mL/min   GFR calc Af Amer >90  >90 mL/min  URINALYSIS, ROUTINE W REFLEX MICROSCOPIC      Result Value Ref Range   Color, Urine ORANGE (*) YELLOW   APPearance HAZY (*) CLEAR   Specific Gravity, Urine 1.023  1.005 - 1.030   pH 5.0  5.0 - 8.0   Glucose, UA NEGATIVE  NEGATIVE mg/dL   Hgb urine dipstick NEGATIVE  NEGATIVE   Bilirubin Urine SMALL (*) NEGATIVE   Ketones, ur 15 (*) NEGATIVE mg/dL   Protein, ur 30 (*) NEGATIVE mg/dL   Urobilinogen, UA 1.0  0.0 - 1.0 mg/dL   Nitrite POSITIVE (*) NEGATIVE   Leukocytes, UA MODERATE (*) NEGATIVE  URINE MICROSCOPIC-ADD ON      Result Value Ref Range   Squamous Epithelial / LPF RARE  RARE   WBC, UA TOO NUMEROUS TO COUNT  <3 WBC/hpf   RBC / HPF 0-2  <3 RBC/hpf   Bacteria, UA FEW (*) RARE   Casts HYALINE CASTS (*) NEGATIVE  I-STAT CG4 LACTIC ACID, ED      Result Value Ref Range   Lactic Acid, Venous 2.08  0.5 - 2.2 mmol/L   Dg Chest 2 View  10/06/2013   CLINICAL DATA:  Fever of 1020.2.  Recent urinary tract infection.  EXAM: CHEST  2 VIEW  COMPARISON:  DG CHEST 1V PORT dated 08/28/2013; CT ANGIO CHEST W/CM &/OR WO/CM dated 08/22/2013; DG CHEST 1V PORT dated 08/26/2013  FINDINGS: Postoperative changes in the mediastinum with aortic  valve replacement and aortic stent graft. Aortic shadow remains enlarged but appears similar to previous studies. Left pleural effusion with basilar atelectasis is similar to prior study. Right lung is clear. Cardiac enlargement with normal pulmonary vascularity.  IMPRESSION: Stable appearance of postoperative changes in the mediastinum. Cardiac enlargement. Left pleural effusion with basilar atelectasis. These changes appear stable since previous study appear   Electronically Signed   By: Burman Nieves M.D.   On: 10/06/2013 00:44       MDM   UTI  Patient here with UTI s/p urinary retention and indwelling catheter.  Presents with fever, chills, rigors, although there is no true sepsis on this patient I feel that  he does need admission for this.  I have spoken with Dr. Allena KatzPatel who will admit to his service.   Izola PriceFrances C. Marisue HumbleSanford, New JerseyPA-C 10/06/13 [phone]

## 2013-10-06 NOTE — Progress Notes (Addendum)
[facility]                                                                              Patient Demographics  [name] [name], is a 73 y.o. male, DOB - Oct 25, 1940, ZOX:[id number]  Admit date - 10/05/2013   Admitting Physician [name], MD  Outpatient Primary MD for the patient is Corky CraftsVARANASI,JAYADEEP S., MD  LOS - 1   Chief Complaint  Patient presents with  . Fever  . Urinary Tract Infection      HPI: Vista DeckRobert D [name] is a 73 y.o. male with Past medical history of coronary artery disease, DVT, hypertension, aortic valve replacement, aortic dissection repair, dyslipidemia, PVD. The patient presented with complaints of burning urination ongoing since last Thursday. The patient was recently admitted in the hospital for vascular procedure and at the time of the discharge he had a Foley catheter because of urinary retention. The catheter was changed 2 more times in his urologist office. Patient was given a cephalosporin 3 times a day after removal of the Foley catheter since he complained of burning urination. He was also given pyridium. Patient started having chills with fever again and therefore he came to the hospital. No nausea no vomiting no abdominal pain no CVA tenderness. No recent change in his medications.   Assessment & Plan   Urinary tract infection -Patient failed outpatient treatment -Was recently admitted for a vascular procedure, all was discharged with Foley catheter and follow up with urology. -UA shows -Continue ciprofloxacin and pyridium -Pending urine culture  Hypokalemia -Will replete and continue to monitor BMP  Anemia -Possibly dilutional -Pending iron studies and fecal occult -Patient denies any dark stools -Currently hemodynamically stable -Will continue to monitor CBC -Pending CT of the abdomen and pelvis  Atrial fibrillation -Continue amiodarone and metoprolol -Apixaban has been held secondary to anemia -Currently rate controlled  Recent  peripheral vascular disease repair with carotid subclavian bypass -Continue aspirin statin  Urinary retention -Continue Flomax -Patient will need to continue following up with urology as an outpatient  Diastolic congestive heart failure -Last echocardiogram on 08/23/2013 shows a systolic function and normal, EF of 50-55%, grade 3 diastolic dysfunction -Currently euvolemic and compensated -Will continue to monitor patient, place him on strict intake and output, and monitor daily weight  Code Status: Full  Family Communication: None at bedside  Disposition Plan: Admitted  Time Spent in minutes   30 minutes  Procedures none  Consults  none  DVT Prophylaxis  SCDs  Lab Results  Component Value Date   PLT 198 10/06/2013    Medications  Scheduled Meds: . amiodarone  200 mg Oral Daily  . atorvastatin  10 mg Oral Daily  . ciprofloxacin  400 mg Intravenous Q12H  . hydrALAZINE  25 mg Oral BID  . metoprolol  25 mg Oral BID  . pantoprazole  40 mg Oral Daily  . phenazopyridine  100 mg Oral TID WC  . potassium chloride  10 mEq Intravenous Q1 Hr x 2  . potassium chloride SA  40 mEq Oral Daily  . tamsulosin  0.4 mg Oral BID   Continuous Infusions:  PRN Meds:.acetaminophen, acetaminophen, nitroGLYCERIN, ondansetron (ZOFRAN) IV, ondansetron, zolpidem  Antibiotics   Anti-infectives   Start     Dose/Rate Route Frequency Ordered Stop   10/06/13 1400  ciprofloxacin (CIPRO) IVPB 400 mg     400 mg 200 mL/hr over 60 Minutes Intravenous Every 12 hours 10/06/13 0426     10/06/13 0230  ciprofloxacin (CIPRO) IVPB 400 mg     400 mg 200 mL/hr over 60 Minutes Intravenous  Once 10/06/13 0221 10/06/13 0429   10/06/13 0130  cefTRIAXone (ROCEPHIN) 1 g in dextrose 5 % 50 mL IVPB     1 g 100 mL/hr over 30 Minutes Intravenous  Once 10/06/13 0123 10/06/13 2841      Subjective:   [name] [name] seen and examined today.  Patient has no complaints at this time. Denies any chest pain, shortness  of breath, dizziness, abdominal pain.   Objective:   Filed Vitals:   10/06/13 0146 10/06/13 0156 10/06/13 0315 10/06/13 0357  BP: 113/39 109/41 121/45 122/50  Pulse:  73 73 70  Temp:    98.6 F (37 C)  TempSrc:      Resp:  26 18 20   Height:    5\' 10"  (1.778 m)  Weight:    78.2 kg (172 lb 6.4 oz)  SpO2:  98% 99% 98%    Wt Readings from Last 3 Encounters:  10/06/13 78.2 kg (172 lb 6.4 oz)  09/18/13 79.833 kg (176 lb)  09/04/13 79.833 kg (176 lb)     Intake/Output Summary (Last 24 hours) at 10/06/13 3244 Last data filed at 10/06/13 0738  Gross per 24 hour  Intake      0 ml  Output    125 ml  Net   -125 ml    Exam  General: Well developed, well nourished, NAD, appears stated age  HEENT: NCAT, PERRLA, EOMI, Anicteic Sclera, mucous membranes moist.   Neck: Supple, no JVD, no masses  Cardiovascular: S1 S2 auscultated, no rubs, murmurs or gallops. Regular rate and rhythm.  Respiratory: Clear to auscultation bilaterally with equal chest rise  Abdomen: Soft, nontender, nondistended, + bowel sounds  Extremities: warm dry without cyanosis clubbing or edema  Neuro: AAOx3, cranial nerves grossly intact. Strength equal and bilateral upper and lower extremities   Skin: Without rashes exudates or nodules  Psych: Normal affect and demeanor with intact judgement and insight  Data Review   Micro Results No results found for this or any previous visit (from the past 240 hour(s)).  Radiology Reports Dg Chest 2 View  10/06/2013   CLINICAL DATA:  Fever of 1020.2.  Recent urinary tract infection.  EXAM: CHEST  2 VIEW  COMPARISON:  DG CHEST 1V PORT dated 08/28/2013; CT ANGIO CHEST W/CM &/OR WO/CM dated 08/22/2013; DG CHEST 1V PORT dated 08/26/2013  FINDINGS: Postoperative changes in the mediastinum with aortic valve replacement and aortic stent graft. Aortic shadow remains enlarged but appears similar to previous studies. Left pleural effusion with basilar atelectasis is similar to  prior study. Right lung is clear. Cardiac enlargement with normal pulmonary vascularity.  IMPRESSION: Stable appearance of postoperative changes in the mediastinum. Cardiac enlargement. Left pleural effusion with basilar atelectasis. These changes appear stable since previous study appear   Electronically Signed   By: Burman Nieves M.D.   On: 10/06/2013 00:44    CBC  Recent Labs Lab 10/06/13 0001 10/06/13 0546  WBC 6.1 4.8  HGB 8.3* 7.4*  HCT 27.6* 24.7*  PLT 232 198  MCV 92.0 92.9  MCH 27.7 27.8  MCHC 30.1 30.0  RDW  17.8* 17.8*  LYMPHSABS 0.5* 0.4*  MONOABS 0.5 0.5  EOSABS 0.0 0.1  BASOSABS 0.0 0.0    Chemistries   Recent Labs Lab 10/06/13 0001 10/06/13 0546  NA 138 142  K 3.6* 3.3*  CL 99 104  CO2 23 25  GLUCOSE 128* 123*  BUN 23 22  CREATININE 0.83 0.82  CALCIUM 8.4 8.1*  AST 79* 65*  ALT 77* 66*  ALKPHOS 90 86  BILITOT 0.6 0.4   ------------------------------------------------------------------------------------------------------------------ estimated creatinine clearance is 81.6 ml/min (by C-G formula based on Cr of 0.82). ------------------------------------------------------------------------------------------------------------------ No results found for this basename: HGBA1C,  in the last 72 hours ------------------------------------------------------------------------------------------------------------------ No results found for this basename: CHOL, HDL, LDLCALC, TRIG, CHOLHDL, LDLDIRECT,  in the last 72 hours ------------------------------------------------------------------------------------------------------------------ No results found for this basename: TSH, T4TOTAL, FREET3, T3FREE, THYROIDAB,  in the last 72 hours ------------------------------------------------------------------------------------------------------------------ No results found for this basename: VITAMINB12, FOLATE, FERRITIN, TIBC, IRON, RETICCTPCT,  in the last 72  hours  Coagulation profile  Recent Labs Lab 10/06/13 0546  INR 1.74*    No results found for this basename: DDIMER,  in the last 72 hours  Cardiac Enzymes No results found for this basename: CK, CKMB, TROPONINI, MYOGLOBIN,  in the last 168 hours ------------------------------------------------------------------------------------------------------------------ No components found with this basename: POCBNP,     Antonino Nienhuis D.O. on 10/06/2013 at 8:12 AM  Between 7am to 7pm - Pager - [phone]  After 7pm go to [email or web address] - password [email or web address]  And look for the night coverage person covering for me after hours  [facility] Office  [phone]

## 2013-10-06 NOTE — Progress Notes (Signed)
ANTIBIOTIC CONSULT NOTE - INITIAL  Pharmacy Consult:  Vancomycin / Cefepime Indication:  HCAP / UTI  Allergies  Allergen Reactions  . Lortab [Hydrocodone-Acetaminophen] Hives  . Morphine And Related Hives    Patient Measurements: Height: 5\' 10"  (177.8 cm) Weight: 172 lb 6.4 oz (78.2 kg) IBW/kg (Calculated) : 73  Vital Signs: Temp: 98.5 F (36.9 C) (04/20 1427) Temp src: Oral (04/20 1427) BP: 108/46 mmHg (04/20 1427) Pulse Rate: 64 (04/20 1427)  Labs:  Recent Labs  10/06/13 0001 10/06/13 0546  WBC 6.1 4.8  HGB 8.3* 7.4*  PLT 232 198  CREATININE 0.83 0.82   Estimated Creatinine Clearance: 81.6 ml/min (by C-G formula based on Cr of 0.82). No results found for this basename: VANCOTROUGH, VANCOPEAK, VANCORANDOM, GENTTROUGH, GENTPEAK, GENTRANDOM, TOBRATROUGH, TOBRAPEAK, TOBRARND, AMIKACINPEAK, AMIKACINTROU, AMIKACIN,  in the last 72 hours   Microbiology: No results found for this or any previous visit (from the past 720 hour(s)).  Medical History: Past Medical History  Diagnosis Date  . Hyperlipidemia   . DVT (deep venous thrombosis)     post knee surgery  . Tobacco abuse   . HTN (hypertension)   . Descending aortic aneurysm   . Aortic valve insufficiency   . Thoracic aneurysm   . S/P AVR (aortic valve replacement)   . CAD (coronary artery disease)   . Atrial flutter   . CHF (congestive heart failure)      Assessment: 71 YOM known to Pharmacy from Cipro dosing for UTI.  Patient to broaden antibiotics to vancomycin and cefepime to cover for HCAP.  His renal function is stable.  Cipro 4/20 >> 4/20 Vanc 4/20 >> Cefepime 4/20 >>  4/20 UCx - 4/19 BCx x2 -  4/20 BCx x2 -    Goal of Therapy:  Vancomycin trough level 15-20 mcg/ml   Plan:  - Vanc 1250mg  IV Q12H  - Cefepime 1gm IV Q12H - Monitor renal fxn, clinical course, vanc prior to 4th dose    Luretta Everly D. Laney Potash, PharmD, BCPS Pager:  [phone] 10/06/2013, 7:38 PM

## 2013-10-06 NOTE — Progress Notes (Signed)
ANTICOAGULATION and ANTIBIOTIC CONSULT NOTE - Initial Consult  Pharmacy Consult for Apixaban  Indication: atrial fibrillation  Pharmacy Consult for Cipro Indication: UTI  Allergies  Allergen Reactions  . Lortab [Hydrocodone-Acetaminophen] Hives  . Morphine And Related Hives    Patient Measurements: Height: 5\' 10"  (177.8 cm) Weight: 172 lb 6.4 oz (78.2 kg) IBW/kg (Calculated) : 73 Vital Signs: Temp: 98.6 F (37 C) (04/20 0357) Temp src: Oral (04/20 0144) BP: 122/50 mmHg (04/20 0357) Pulse Rate: 70 (04/20 0357)  Labs:  Recent Labs  10/06/13 0001  HGB 8.3*  HCT 27.6*  PLT 232  CREATININE 0.83    Estimated Creatinine Clearance: 80.6 ml/min (by C-G formula based on Cr of 0.83).  Medical History: Past Medical History  Diagnosis Date  . Hyperlipidemia   . DVT (deep venous thrombosis)     post knee surgery  . Tobacco abuse   . HTN (hypertension)   . Descending aortic aneurysm   . Aortic valve insufficiency   . Thoracic aneurysm   . S/P AVR (aortic valve replacement)   . CAD (coronary artery disease)   . Atrial flutter     Medications:  Apixaban PTA  Assessment: 73 y/o M here with fever/UTI on apixaban PTA for afib. Noted Hgb of 8.3, will follow closely. Renal function good. Abnormal U/A.  WBC 6.1, Tmax 101.3.   Goal of Therapy:  Monitor platelets by anticoagulation protocol: Yes   Plan:  -Continue Apixaban 5 mg BID  -Minimum q72h CBC  -Monitor for bleeding, trend Hgb -Cipro 400 mg IV q12h -Trend WBC, temp, renal function   Abran Duke 10/06/2013,4:22 AM

## 2013-10-06 NOTE — ED Provider Notes (Signed)
Medical screening examination/treatment/procedure(s) were performed by non-physician practitioner and as supervising physician I was immediately available for consultation/collaboration.   EKG Interpretation None        Atira Borello, MD 10/06/13 0533 

## 2013-10-06 NOTE — Consult Note (Signed)
PULMONARY / CRITICAL CARE MEDICINE   Name: [name] [name] MRN: [id number] DOB: 1940-10-19    ADMISSION DATE:  10/05/2013 CONSULTATION DATE:  10/06/2013 LOS 2 days   REFERRING MD :  [facility] PRIMARY SERVICE: PCCM  CHIEF COMPLAINT:  Dyspnea  BRIEF PATIENT DESCRIPTION: 73 year old male with recent history of thoracic aortic aneurism s/p mesh stent repair, aortic valve replacement, and chf admitted for sepsis 4/19 developed CP and profound dyspnea after ambulating to restroom 4/20. He was transferred to ICU and PCCM to assume care.   SIGNIFICANT EVENTS / STUDIES:  4/19 admitted for sepsis  LINES / TUBES: ETT 10/07/13>> Central line 4/2/115  CULTURES: Blood 4/19 >>> Urine 4/20 >>> Negative  ANTIBIOTICS: Cefepime 4/19 > 4/20 Vanc 4/19 >>> Zosyn 4/20 >>>  HISTORY OF PRESENT ILLNESS:    PAST MEDICAL HISTORY :  Past Medical History  Diagnosis Date  . Hyperlipidemia   . DVT (deep venous thrombosis)     post knee surgery  . Tobacco abuse   . HTN (hypertension)   . Descending aortic aneurysm   . Aortic valve insufficiency   . Thoracic aneurysm   . S/P AVR (aortic valve replacement)   . CAD (coronary artery disease)   . Atrial flutter   . CHF (congestive heart failure)    Past Surgical History  Procedure Laterality Date  . Right groin lymphocele  [id number]  . Aortic valve replacement  [id number]  . Replacement total knee      bilateral  . Carotid-subclavian bypass graft Left 08/26/2013    Procedure: BYPASS GRAFT CAROTID-SUBCLAVIAN;  Surgeon: Nada Libman, MD;  Location: [facility] OR;  Service: Vascular;  Laterality: Left;  . Endovascular stent insertion N/A 08/26/2013    Procedure:  THORACIC STENT GRAFT INSERTION;  Surgeon: Nada Libman, MD;  Location: [facility] OR;  Service: Vascular;  Laterality: N/A;   Prior to Admission medications   Medication Sig Start Date End Date Taking? Authorizing Provider  amiodarone (PACERONE) 200 MG tablet Take 1 tablet (200 mg total) by mouth daily.  08/29/13  Yes Thereasa Parkin, PA-C  apixaban (ELIQUIS) 5 MG TABS tablet Take 5 mg by mouth 2 (two) times daily.   Yes Historical Provider, MD  aspirin EC 81 MG tablet Take 81 mg by mouth daily.    Yes Historical Provider, MD  atorvastatin (LIPITOR) 10 MG tablet Take 1 tablet (10 mg total) by mouth daily. 05/20/13  Yes [name], MD  cephALEXin (KEFLEX) 500 MG capsule Take 500 mg by mouth 3 (three) times daily. 10/03/13 10/08/13 Yes Historical Provider, MD  enalapril (VASOTEC) 10 MG tablet Take 1 tablet (10 mg total) by mouth daily. 08/29/13  Yes Thereasa Parkin, PA-C  furosemide (LASIX) 40 MG tablet Take 1 tablet (40 mg total) by mouth 2 (two) times daily. 07/18/13  Yes [name], MD  hydrALAZINE (APRESOLINE) 25 MG tablet Take 25 mg by mouth 2 (two) times daily.   Yes Historical Provider, MD  lansoprazole (PREVACID) 30 MG capsule Take 30 mg by mouth daily.    Yes Historical Provider, MD  loratadine (CLARITIN) 10 MG tablet Take 10 mg by mouth daily.    Yes Historical Provider, MD  metoprolol (LOPRESSOR) 25 MG tablet Take 1 tablet (25 mg total) by mouth 2 (two) times daily. 08/29/13  Yes Thereasa Parkin, PA-C  Multiple Vitamin (MULTIVITAMIN WITH MINERALS) TABS tablet Take 1 tablet by mouth daily.   Yes Historical Provider, MD  phenazopyridine (PYRIDIUM) 100 MG tablet Take 100 mg  by mouth 2 (two) times daily as needed for pain.    Yes Historical Provider, MD  potassium chloride SA (K-DUR,KLOR-CON) 20 MEQ tablet Take 2 tablets (40 mEq total) by mouth daily. 05/13/13  Yes [name], MD  tamsulosin (FLOMAX) 0.4 MG CAPS capsule Take 0.4 mg by mouth 2 (two) times daily.    Yes Historical Provider, MD  zolpidem (AMBIEN) 5 MG tablet Take 1 tablet (5 mg total) by mouth at bedtime as needed for sleep. 09/30/13  Yes [name], MD  nitroGLYCERIN (NITROSTAT) 0.4 MG SL tablet Place 0.4 mg under the tongue every 5 (five) minutes as needed.      Historical Provider, MD   Allergies   Allergen Reactions  . Lortab [Hydrocodone-Acetaminophen] Hives  . Morphine And Related Hives    FAMILY HISTORY:  History reviewed. No pertinent family history. SOCIAL HISTORY:  reports that he quit smoking about 39 years ago. His smoking use included Cigarettes. He smoked 0.00 packs per day. He has never used smokeless tobacco. He reports that he does not drink alcohol or use illicit drugs.  REVIEW OF SYSTEMS:   Bolds are positive  Constitutional: weight loss, gain, night sweats, Fevers, chills, fatigue .  HEENT: headaches, Sore throat, sneezing, nasal congestion, post nasal drip, Difficulty swallowing, Tooth/dental problems, visual complaints visual changes, ear ache CV:  chest pain, radiates: ,Orthopnea, PND, swelling in lower extremities, dizziness, palpitations, syncope.  GI  heartburn, indigestion, abdominal pain, nausea, vomiting, diarrhea, change in bowel habits, loss of appetite, bloody stools.  Resp: cough, productive: clear , hemoptysis, dyspnea, chest pain, pleuritic.  Skin: rash or itching or icterus GU: dysuria, change in color of urine, urgency or frequency. flank pain, hematuria  MS: joint pain or swelling. decreased range of motion  Psych: change in mood or affect. depression or anxiety.  Neuro: difficulty with speech, weakness, numbness, ataxia    SUBJECTIVE:   VITAL SIGNS: Temp:  [98.5 F (36.9 C)-101.3 F (38.5 C)] 99 F (37.2 C) (04/20 2220) Pulse Rate:  [64-84] 79 (04/20 2220) Resp:  [18-34] 18 (04/20 2031) BP: (84-136)/(39-50) 84/50 mmHg (04/20 2220) SpO2:  [84 %-99 %] 84 % (04/20 2220) Weight:  [78.2 kg (172 lb 6.4 oz)] 78.2 kg (172 lb 6.4 oz) (04/20 0357) HEMODYNAMICS:   VENTILATOR SETTINGS:   INTAKE / OUTPUT: Intake/Output     04/20 0701 - 04/21 0700   P.O. 660   Total Intake(mL/kg) 660 (8.4)   Urine (mL/kg/hr) 800 (0.6)   Total Output 800   Net -140         PHYSICAL EXAMINATION: General:  Acutely ill appearing male in moderate  respiratory distress Neuro:  Alert, oriented, anxious HEENT: [place]/AT, no jvd noted Cardiovascular:  RRR tachy Lungs:  Respirations even, severely labored. Clear  Sounds anteriorly Abdomen:  Soft, non-tender Musculoskeletal:  No acute deformity Skin:  diaphoretic  LABS: PULMONARY  Recent Labs Lab 10/06/13 2300  PHART 7.266*  PCO2ART 44.1  PO2ART 84.9  HCO3 18.7*  TCO2 19.9  O2SAT 90.8    CBC  Recent Labs Lab 10/06/13 0001 10/06/13 0546  HGB 8.3* 7.4*  HCT 27.6* 24.7*  WBC 6.1 4.8  PLT 232 198    COAGULATION  Recent Labs Lab 10/06/13 0546  INR 1.74*    CARDIAC   Recent Labs Lab 10/06/13 2249  TROPONINI <0.30    Recent Labs Lab 10/07/13 0030  PROBNP 3141.0*     CHEMISTRY  Recent Labs Lab 10/06/13 0001 10/06/13 0546 10/06/13 2315  NA 138 142 138  K 3.6* 3.3* 4.2  CL 99 104 102  CO2 23 25 19   GLUCOSE 128* 123* 125*  BUN 23 22 21   CREATININE 0.83 0.82 0.73  CALCIUM 8.4 8.1* 8.5   Estimated Creatinine Clearance: 83.6 ml/min (by C-G formula based on Cr of 0.73).   LIVER  Recent Labs Lab 10/06/13 0001 10/06/13 0546  AST 79* 65*  ALT 77* 66*  ALKPHOS 90 86  BILITOT 0.6 0.4  PROT 6.4 5.8*  ALBUMIN 2.9* 2.6*  INR  --  1.74*     INFECTIOUS  Recent Labs Lab 10/06/13 0017 10/06/13 2315  LATICACIDVEN 2.08 3.7*     ENDOCRINE CBG (last 3)   Recent Labs  10/06/13 2327  GLUCAP 128*         IMAGING x48h  Dg Chest 2 View  10/06/2013   CLINICAL DATA:  Fever of 1020.2.  Recent urinary tract infection.  EXAM: CHEST  2 VIEW  COMPARISON:  DG CHEST 1V PORT dated 08/28/2013; CT ANGIO CHEST W/CM &/OR WO/CM dated 08/22/2013; DG CHEST 1V PORT dated 08/26/2013  FINDINGS: Postoperative changes in the mediastinum with aortic valve replacement and aortic stent graft. Aortic shadow remains enlarged but appears similar to previous studies. Left pleural effusion with basilar atelectasis is similar to prior study. Right lung is clear. Cardiac  enlargement with normal pulmonary vascularity.  IMPRESSION: Stable appearance of postoperative changes in the mediastinum. Cardiac enlargement. Left pleural effusion with basilar atelectasis. These changes appear stable since previous study appear   Electronically Signed   By: Burman Nieves M.D.   On: 10/06/2013 00:44   Ct Abdomen Pelvis W Contrast  10/06/2013   CLINICAL DATA:  pt with anemia, no external bleed. rule out retroperitoneal bleed, also has recurrent UTi evaluate obstruction  EXAM: CT ABDOMEN AND PELVIS WITH CONTRAST  TECHNIQUE: Multidetector CT imaging of the abdomen and pelvis was performed using the standard protocol following bolus administration of intravenous contrast.  CONTRAST:  OMNIPAQUE IOHEXOL 300 MG/ML  SOLN  COMPARISON:  CT CTA ABD/PEL W/CM AND/OR W/O CM dated 08/22/2013; DG CHEST 2 VIEW dated 10/06/2013; CT ANGIO CHEST W/CM &/OR WO/CM dated 05/18/2011; CT ANGIO CHEST W/CM &/OR WO/CM dated 05/30/2012  FINDINGS: The patient is status post aortic stent graft repair. The graft is only partially visualized. Along the superior aspect of the descending aorta a crescent-shaped area of of low attenuation (Hounsfield units 29) is appreciated adjacent to the aorta likely representing thrombus within the false lumen of the dissection. There is no evidence of active extravasation within the visualized portions of the stented aorta. The thoracic aorta is incompletely evaluated on this study.  A small left pleural effusion is appreciated increased from prior. There has been near complete resolution of the previous left pleural effusion. Areas of increased density are appreciated within the lung bases left greater than right differential considerations are atelectasis versus infiltrates.  Atherosclerotic calcification identified within the coronary vessels.  The liver, spleen, adrenals, pancreas, and left kidney are unremarkable. A benign Bosniak type 1 cyst is appreciated within the upper pole of  the right kidney. A stable 1.3 cm nonenhancing cystic appearing nodule is appreciated along the anterior lateral lower pole right kidney, stable. There is mild stranding surrounding this finding which may represent prior rupture of a cyst.  There is no evidence of abdominal free fluid, loculated fluid collections, masses, nor adenopathy.  Bowel is negative. There is no evidence of a retroperitoneal hematoma.  There is  no evidence of abdominal aortic aneurysm. The celiac, SMA, IMA, portal vein are patent. Atherosclerotic calcifications are appreciated within the abdominal aorta.  Small calcified gallstones appreciated within the dependent portion of the gallbladder.  A trace amount of fluid is appreciated within the dependent portion of the lower pelvis. No pelvic masses, loculated fluid collections nor adenopathy is appreciated. There is diverticulosis within the sigmoid colon.  A small fat containing umbilical hernia is identified. There is no evidence of an inguinal hernia.  Multilevel spondylosis within the lumbar spine. No aggressive appearing osseous lesions.  IMPRESSION: 1. Patient is status post aortic stent graft repair. The stent graft is incompletely visualized. There are findings likely reflecting thrombus within the false lumen. Active extravasation is not appreciated. Leakage within non evaluated portions of the graft cannot be excluded on this study. Considering the patient's history further evaluation of the entire thoracic aorta and recommended with CT angiography. These results were called by telephone at the time of interpretation on 10/06/2013 at 11:43 AM to Dr. Edsel Petrin , who verbally acknowledged these results. 2. No evidence of focal or acute intra-abdominal abnormalities. A trace amount of fluid is appreciated within the pelvis this may represent reactive fluid considering the patient's history of recent surgical intervention. 3. Stable benign Bosniak type 1 cyst right kidney.  Indeterminate stable low attenuating focus inferior pole right kidney which may represent a partially ruptured cyst. 4. Increased size of the patient's left pleural effusion with near complete resolution of the right. 5. Atelectasis versus infiltrate within the lung bases.   Electronically Signed   By: Salome Holmes M.D.   On: 10/06/2013 11:49   Dg Chest Port 1 View  10/06/2013   CLINICAL DATA:  Cough and respiratory distress.  EXAM: PORTABLE CHEST - 1 VIEW  COMPARISON:  CT ANGIO CHEST AORTA W/CM & WO/CM dated 10/06/2013; DG CHEST 2 VIEW dated 10/06/2013  FINDINGS: No significant change since previous study. Previous postoperative changes in the mediastinum with aortic stent graft placement. Mild cardiac enlargement. Mild pulmonary vascular congestion. Small left pleural effusion with basilar atelectasis.  IMPRESSION: No significant change since prior study.   Electronically Signed   By: Burman Nieves M.D.   On: 10/06/2013 23:10   Ct Angio Chest Aortic Dissect W &/or W/o  10/06/2013   CLINICAL DATA:  Recent drop in hemoglobin, history of thoracic aortic stent graft  EXAM: CT ANGIOGRAPHY CHEST WITH CONTRAST  TECHNIQUE: Multidetector CT imaging of the chest was performed using the standard protocol during bolus administration of intravenous contrast. Multiplanar CT image reconstructions and MIPs were obtained to evaluate the vascular anatomy.  CONTRAST:  80mL OMNIPAQUE IOHEXOL 350 MG/ML SOLN  COMPARISON:  08/22/2013  FINDINGS: A left-sided pleural effusion is seen which is increased from the prior exam. Left lower lobe consolidation secondary to the effusion is seen. No significant right-sided infiltrate is noted. 7 mm nodule is again seen in the anterior aspect of the right upper lobe best seen on image number 43. This is stable from the prior exam.  There are changes consistent with recent aortic stent graft placement. The stent is widely patent and no findings to suggest endoleak are identified. The  previously seen aortic dissection has been excluded. The thrombus surrounding the stent graft in the descending thoracic aorta is slightly greater in density than the adjacent fluid although felt to be within normal limits given the relative recent surgical history. There are no findings to suggest active extravasation. The more distal thoracic aorta  and proximal visualized abdominal aorta are within normal limits. Few small mediastinal lymph nodes are seen which are stable from the prior exam. 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 Some retrograde flow in the proximal portion of the subclavian artery is noted secondary to a carotid subclavian bypass graft which appears patent.  No evidence of pulmonary emboli is identified. The visualized upper abdomen this similar to that seen on recent CT of the abdomen and pelvis.  Review of the MIP images confirms the above findings.  IMPRESSION: Changes of thoracic aortic stent graft with exclusion of the previously seen aortic dissection. No evidence to suggest endoleak is identified.  Stable right upper lobe pulmonary nodule.  Left lower lobe consolidation with associated effusion.  Patent left common carotid artery to left subclavian artery bypass with some retrograde filling of the more proximal left subclavian artery. 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   Electronically Signed   By: Alcide CleverMark  Lukens M.D.   On: 10/06/2013 16:58       ASSESSMENT / PLAN:  PULMONARY A:  Acute respiratory failure likely due to septic shock (staff MD comment() LLL infiltrate PE ruled out on CTA prior to this acute episode  0 worsening resp distress despite lasix  P:   Intubate 40mg  IV lasix now Check ABG Check PCT Check CXR in am Doppler BLE  CARDIOVASCULAR A:  Diastolic CHF (grade 3 DD on 08/2013 echo, preserved systolic function) S/p  aortic valve replacement on apixaban  H/o thoracic aorta dissection s/p stent/repair.   P:  Consider calling CVTS after 7am Continue PO BP medications if able to avoid intubation D/c Apixaban and transition to IV heparin; wait 24h-48h after apixaban dc Check lactic acid Monitor closely for signs of bleeding Trend troponin Check ProBNP   RENAL A:   Metabolic acidosis  P:   May need bicarb gtt if unable to compensate with BiPAP Strict I&O Cont flomax Monitor BMP  GASTROINTESTINAL A:   GERD  P:   NPO Cont PPI  HEMATOLOGIC A:   Acute Anemia On apixaban coagulopathy  P:  Follow anemia workup Follow CBC  Hold apixaban x 48h and then consider IV heparin gtt (staff MD note).  PRBC for hgb <7gm% unless MI then < 8gm% (staff MD note) If actively bleeding, transfuse for hemodynamics  INFECTIOUS A:   High Pre-test septic shock  LLL infiltrate potentially PNA overall appears small/stable.  Recent admission for UTI  P:   Broaden ABX as above Follow Blood cx Check PCT Trend WBC and fever curve  ENDOCRINE A:  No acute issues P:   Monitor Check CBG on [facility]  NEUROLOGIC A:  No acute issue P:   Monitor   Joneen RoachPaul Hoffman, ACNP [facility] Pager [phone] or [phone] 10/06/2013, 11:22 PM    STAFF NOTE: I, Dr Lavinia SharpsM Emonte Dieujuste have personally reviewed patient's available data, including medical history, events of note, physical examination and test results as part of my evaluation. I have discussed with resident/NP and other care providers such as pharmacist, RN and RRT.  In addition,  I personally evaluated patient and elicited key findings of acute resp failure likely due to sepsis. No evidence of aortic graft leak on CT or rupture. Also in circulatory shock due to sepsis most likely. Intubate and start pressors. Currently maintaining renal function but this could change.  Patient was seen by me on 10/07/13 (PA saw 10/06/13).  Rest per NP/medical  resident whose note is outlined above and that I agree with  The patient is critically ill with multiple organ systems failure and requires high complexity decision making for assessment and support, frequent evaluation and titration of therapies, application of advanced monitoring technologies and extensive interpretation of multiple databases.   Critical Care Time devoted to patient care services described in this note is  45  Minutes.  Dr. Kalman Shan, M.D., [facility].C.P Pulmonary and Critical Care Medicine Staff Physician [facility] Pager: [phone], If no answer or between  15:00h - 7:00h: call [phone]  10/07/2013 1:20 AM

## 2013-10-06 NOTE — H&P (Addendum)
[facility] History and Physical  Patient: [name] [name]  HDQ:[id number]  DOB: 01-02-1941  DOS: the patient was seen and examined on 10/06/2013 PCP: [name]., MD  Chief Complaint: Burning urination with fever  HPI: [name] [name] is a 73 y.o. male with Past medical history of coronary artery disease, DVT, hypertension, aortic valve replacement, aortic dissection repair, dyslipidemia, PVD. The patient presented with complaints of burning urination ongoing since last Thursday. The patient was recently admitted in the hospital for vascular procedure and at the time of the discharge he had a Foley catheter because of urinary retention. The catheter was changed 2 more times in his urologist office. Patient was given a cephalosporin 3 times a day after removal of the Foley catheter since he complained of burning urination. He was also given pyridium. Today the patient started having chills with fever again and therefore he came to the hospital. No nausea no vomiting no abdominal pain no CVA tenderness. No recent change in his medications.   The patient is coming from home. And at her baseline Independent for most of his  ADL.  Review of Systems: as mentioned in the history of present illness.  A Comprehensive review of the other systems is negative.  Past Medical History  Diagnosis Date  . Hyperlipidemia   . DVT (deep venous thrombosis)     post knee surgery  . Tobacco abuse   . HTN (hypertension)   . Descending aortic aneurysm   . Aortic valve insufficiency   . Thoracic aneurysm   . S/P AVR (aortic valve replacement)   . CAD (coronary artery disease)   . Atrial flutter    Past Surgical History  Procedure Laterality Date  . Right groin lymphocele  [id number]  . Aortic valve replacement  [id number]  . Replacement total knee      bilateral  . Carotid-subclavian bypass graft Left 08/26/2013    Procedure: BYPASS GRAFT CAROTID-SUBCLAVIAN;  Surgeon: Nada Libman,  MD;  Location: [facility] OR;  Service: Vascular;  Laterality: Left;  . Endovascular stent insertion N/A 08/26/2013    Procedure:  THORACIC STENT GRAFT INSERTION;  Surgeon: Nada Libman, MD;  Location: [facility] OR;  Service: Vascular;  Laterality: N/A;   Social History:  reports that he quit smoking about 39 years ago. His smoking use included Cigarettes. He smoked 0.00 packs per day. He has never used smokeless tobacco. He reports that he does not drink alcohol or use illicit drugs.  Allergies  Allergen Reactions  . Lortab [Hydrocodone-Acetaminophen] Hives  . Morphine And Related Hives    History reviewed. No pertinent family history.  Prior to Admission medications   Medication Sig Start Date End Date Taking? Authorizing Provider  amiodarone (PACERONE) 200 MG tablet Take 1 tablet (200 mg total) by mouth daily. 08/29/13  Yes Thereasa Parkin, PA-C  apixaban (ELIQUIS) 5 MG TABS tablet Take 5 mg by mouth 2 (two) times daily.   Yes Historical Provider, MD  aspirin EC 81 MG tablet Take 81 mg by mouth daily.    Yes Historical Provider, MD  atorvastatin (LIPITOR) 10 MG tablet Take 1 tablet (10 mg total) by mouth daily. 05/20/13  Yes [name], MD  cephALEXin (KEFLEX) 500 MG capsule Take 500 mg by mouth 3 (three) times daily. 10/03/13 10/08/13 Yes Historical Provider, MD  enalapril (VASOTEC) 10 MG tablet Take 1 tablet (10 mg total) by mouth daily. 08/29/13  Yes Thereasa Parkin, PA-C  furosemide (LASIX) 40 MG tablet Take 1 tablet (  40 mg total) by mouth 2 (two) times daily. 07/18/13  Yes Corky CraftsJayadeep S Varanasi, MD  hydrALAZINE (APRESOLINE) 25 MG tablet Take 25 mg by mouth 2 (two) times daily.   Yes Historical Provider, MD  lansoprazole (PREVACID) 30 MG capsule Take 30 mg by mouth daily.    Yes Historical Provider, MD  loratadine (CLARITIN) 10 MG tablet Take 10 mg by mouth daily.    Yes Historical Provider, MD  metoprolol (LOPRESSOR) 25 MG tablet Take 1 tablet (25 mg total) by mouth 2 (two) times daily. 08/29/13   Yes Thereasa ParkinKathryn Stern, PA-C  Multiple Vitamin (MULTIVITAMIN WITH MINERALS) TABS tablet Take 1 tablet by mouth daily.   Yes Historical Provider, MD  phenazopyridine (PYRIDIUM) 100 MG tablet Take 100 mg by mouth 2 (two) times daily as needed for pain.    Yes Historical Provider, MD  potassium chloride SA (K-DUR,KLOR-CON) 20 MEQ tablet Take 2 tablets (40 mEq total) by mouth daily. 05/13/13  Yes Corky CraftsJayadeep S Varanasi, MD  tamsulosin (FLOMAX) 0.4 MG CAPS capsule Take 0.4 mg by mouth 2 (two) times daily.    Yes Historical Provider, MD  zolpidem (AMBIEN) 5 MG tablet Take 1 tablet (5 mg total) by mouth at bedtime as needed for sleep. 09/30/13  Yes Corky CraftsJayadeep S Varanasi, MD  nitroGLYCERIN (NITROSTAT) 0.4 MG SL tablet Place 0.4 mg under the tongue every 5 (five) minutes as needed.      Historical Provider, MD    Physical Exam: Filed Vitals:   10/06/13 0146 10/06/13 0156 10/06/13 0315 10/06/13 0357  BP: 113/39 109/41 121/45 122/50  Pulse:  73 73 70  Temp:    98.6 F (37 C)  TempSrc:      Resp:  26 18 20   Height:    5\' 10"  (1.778 m)  Weight:    78.2 kg (172 lb 6.4 oz)  SpO2:  98% 99% 98%    General: Alert, Awake and Oriented to Time, Place and Person. Appear in mild distress Eyes: PERRL ENT: Oral Mucosa clear moist. Neck: no JVD Cardiovascular: S1 and S2 Present, no Murmur, Peripheral Pulses Present Respiratory: Bilateral Air entry equal and Decreased, Clear to Auscultation,  no Crackles,no wheezes Abdomen: Bowel Sound Present, Soft and Non tender Skin: no Rash Extremities: no Pedal edema, no calf tenderness Neurologic: Grossly Unremarkable.  Labs on Admission:  CBC:  Recent Labs Lab 10/06/13 0001  WBC 6.1  NEUTROABS 5.1  HGB 8.3*  HCT 27.6*  MCV 92.0  PLT 232    CMP     Component Value Date/Time   NA 138 10/06/2013 0001   K 3.6* 10/06/2013 0001   CL 99 10/06/2013 0001   CO2 23 10/06/2013 0001   GLUCOSE 128* 10/06/2013 0001   BUN 23 10/06/2013 0001   CREATININE 0.83 10/06/2013 0001    CALCIUM 8.4 10/06/2013 0001   PROT 6.4 10/06/2013 0001   ALBUMIN 2.9* 10/06/2013 0001   AST 79* 10/06/2013 0001   ALT 77* 10/06/2013 0001   ALKPHOS 90 10/06/2013 0001   BILITOT 0.6 10/06/2013 0001   GFRNONAA 85* 10/06/2013 0001   GFRAA >90 10/06/2013 0001    No results found for this basename: LIPASE, AMYLASE,  in the last 168 hours No results found for this basename: AMMONIA,  in the last 168 hours  No results found for this basename: CKTOTAL, CKMB, CKMBINDEX, TROPONINI,  in the last 168 hours BNP (last 3 results)  Recent Labs  05/04/13 0155 08/22/13 0959  PROBNP 484.8* 4028.0*    Radiological  Exams on Admission: Dg Chest 2 View  10/06/2013   CLINICAL DATA:  Fever of 1020.2.  Recent urinary tract infection.  EXAM: CHEST  2 VIEW  COMPARISON:  DG CHEST 1V PORT dated 08/28/2013; CT ANGIO CHEST W/CM &/OR WO/CM dated 08/22/2013; DG CHEST 1V PORT dated 08/26/2013  FINDINGS: Postoperative changes in the mediastinum with aortic valve replacement and aortic stent graft. Aortic shadow remains enlarged but appears similar to previous studies. Left pleural effusion with basilar atelectasis is similar to prior study. Right lung is clear. Cardiac enlargement with normal pulmonary vascularity.  IMPRESSION: Stable appearance of postoperative changes in the mediastinum. Cardiac enlargement. Left pleural effusion with basilar atelectasis. These changes appear stable since previous study appear   Electronically Signed   By: Burman Nieves M.D.   On: 10/06/2013 00:44     Assessment/Plan Principal Problem:   UTI (urinary tract infection) Active Problems:   HTN (hypertension)   Aortic dissection   CAD (coronary artery disease)   S/P AVR (aortic valve replacement)   Chronic systolic congestive heart failure   Atrial fibrillation   Recurrent UTI   Urinary retention   1. UTI (urinary tract infection) The patient is presenting with complaints of burning urination and fever. His urine is showing too numerous  to count WBC. With this the patient will be treated her in the hospital with ciprofloxacin since he has failed outpatient treatment with cephalosporin. Patient may require checking his urine again to make sure clearance. He may also require ultrasound renal/urologic consultation for further workup about his recurrent UTI.  2. Recent peripheral vascular disease repair with carotid-subclavian bypass Continue aspirin and statin  3. A. Fib Continue the amiodarone, Apixaban per pharmacy  DVT Prophylaxis: subcutaneous Heparin Nutrition: Regular diet  Code Status: Full  Disposition: Admitted to observation in telemetry unit.  Addendum 6:48 AM  1. Anemia Etiology unknown, no external bleeding noted, no CVA tenderness or abdominal pain on exam,. Check iron, tibc, retic, hemoccult, hold apixaban indication was atrial fibrillation and aspirin 1 dose. Type and screen.  2. Hypokalemia Replacing, in the past had NSVT DUE to low potassium.  Author: Lynden Oxford, MD [facility] Pager: [phone] 10/06/2013, 4:15 AM    If 7PM-7AM, please contact night-coverage [email or web address] Password [email or web address]

## 2013-10-06 NOTE — Progress Notes (Signed)
UR completed. Patient changed to inpatient- requiring IV antibiotics.  

## 2013-10-06 NOTE — ED Notes (Signed)
Returned from radiology and placed back on monitor,.

## 2013-10-07 ENCOUNTER — Inpatient Hospital Stay (HOSPITAL_COMMUNITY): Payer: Medicare Other

## 2013-10-07 DIAGNOSIS — R579 Shock, unspecified: Secondary | ICD-10-CM | POA: Diagnosis present

## 2013-10-07 DIAGNOSIS — E872 Acidosis, unspecified: Secondary | ICD-10-CM

## 2013-10-07 DIAGNOSIS — A419 Sepsis, unspecified organism: Secondary | ICD-10-CM | POA: Diagnosis present

## 2013-10-07 DIAGNOSIS — J9601 Acute respiratory failure with hypoxia: Secondary | ICD-10-CM

## 2013-10-07 HISTORY — DX: Acute respiratory failure with hypoxia: J96.01

## 2013-10-07 HISTORY — DX: Shock, unspecified: R57.9

## 2013-10-07 LAB — BASIC METABOLIC PANEL
BUN: 21 mg/dL (ref 6–23)
BUN: 21 mg/dL (ref 6–23)
BUN: 20 mg/dL (ref 6–23)
CALCIUM: 8.4 mg/dL (ref 8.4–10.5)
CO2: 19 meq/L (ref 19–32)
CO2: 22 mEq/L (ref 19–32)
CO2: 20 meq/L (ref 19–32)
CREATININE: 0.73 mg/dL (ref 0.50–1.35)
CREATININE: 0.85 mg/dL (ref 0.50–1.35)
Calcium: 8.5 mg/dL (ref 8.4–10.5)
Calcium: 7.4 mg/dL — ABNORMAL LOW (ref 8.4–10.5)
Chloride: 102 mEq/L (ref 96–112)
Chloride: 105 mEq/L (ref 96–112)
Chloride: 106 mEq/L (ref 96–112)
Creatinine, Ser: 0.72 mg/dL (ref 0.50–1.35)
GFR calc Af Amer: >90 mL/min (ref >90)
GFR calc Af Amer: >90 mL/min (ref >90)
GFR calc Af Amer: >90 mL/min (ref >90)
GFR calc non Af Amer: 90 mL/min — ABNORMAL LOW (ref >90)
GFR, EST NON AFRICAN AMERICAN: 89 mL/min — AB (ref >90)
GFR, EST NON AFRICAN AMERICAN: 84 mL/min — AB (ref >90)
GLUCOSE: 125 mg/dL — AB (ref 70–99)
GLUCOSE: 153 mg/dL — AB (ref 70–99)
Glucose, Bld: 118 mg/dL — ABNORMAL HIGH (ref 70–99)
POTASSIUM: 4.2 meq/L (ref 3.7–5.3)
Potassium: 4.2 mEq/L (ref 3.7–5.3)
Potassium: 3.3 mEq/L — ABNORMAL LOW (ref 3.7–5.3)
SODIUM: 142 meq/L (ref 137–147)
SODIUM: 139 meq/L (ref 137–147)
Sodium: 138 mEq/L (ref 137–147)

## 2013-10-07 LAB — BLOOD GAS, ARTERIAL
Acid-base deficit: 1.6 mmol/L (ref 0.0–2.0)
Acid-base deficit: 2.4 mmol/L — ABNORMAL HIGH (ref 0.0–2.0)
BICARBONATE: 23.5 meq/L (ref 20.0–24.0)
BICARBONATE: 22 meq/L (ref 20.0–24.0)
Drawn by: 39866
Drawn by: 252031
FIO2: 1 %
FIO2: 0.6 %
LHR: 16 {breaths}/min
MECHVT: 580 mL
O2 SAT: 98.2 %
O2 SAT: 96.3 %
PATIENT TEMPERATURE: 102.8
PEEP/CPAP: 5 cmH2O
PEEP: 5 cmH2O
PH ART: 7.299 — AB (ref 7.350–7.450)
Patient temperature: 99
RATE: 16 resp/min
TCO2: 24.9 mmol/L (ref 0–100)
TCO2: 23.2 mmol/L (ref 0–100)
VT: 500 mL
pCO2 arterial: 51 mmHg — ABNORMAL HIGH (ref 35.0–45.0)
pCO2 arterial: 39.1 mmHg (ref 35.0–45.0)
pH, Arterial: 7.371 (ref 7.350–7.450)
pO2, Arterial: 217 mmHg — ABNORMAL HIGH (ref 80.0–100.0)
pO2, Arterial: 105 mmHg — ABNORMAL HIGH (ref 80.0–100.0)

## 2013-10-07 LAB — CBC
HCT: 31.5 % — ABNORMAL LOW (ref 39.0–52.0)
Hemoglobin: 9.1 g/dL — ABNORMAL LOW (ref 13.0–17.0)
MCH: 27.1 pg (ref 26.0–34.0)
MCHC: 28.9 g/dL — ABNORMAL LOW (ref 30.0–36.0)
MCV: 93.8 fL (ref 78.0–100.0)
Platelets: 241 10*3/uL (ref 150–400)
RBC: 3.36 MIL/uL — ABNORMAL LOW (ref 4.22–5.81)
RDW: 17.9 % — AB (ref 11.5–15.5)
WBC: 8.6 10*3/uL (ref 4.0–10.5)

## 2013-10-07 LAB — TROPONIN I
Troponin I: <0.3 ng/mL (ref <0.30)
Troponin I: <0.3 ng/mL (ref <0.30)
Troponin I: <0.3 ng/mL (ref <0.30)

## 2013-10-07 LAB — GLUCOSE, CAPILLARY
Glucose-Capillary: 128 mg/dL — ABNORMAL HIGH (ref 70–99)
Glucose-Capillary: 111 mg/dL — ABNORMAL HIGH (ref 70–99)
Glucose-Capillary: 135 mg/dL — ABNORMAL HIGH (ref 70–99)

## 2013-10-07 LAB — TRIGLYCERIDES: TRIGLYCERIDES: 116 mg/dL (ref <150)

## 2013-10-07 LAB — PROCALCITONIN
Procalcitonin: 0.21 ng/mL
Procalcitonin: 1.39 ng/mL

## 2013-10-07 LAB — HEPARIN LEVEL (UNFRACTIONATED): Heparin Unfractionated: 1.52 IU/mL — ABNORMAL HIGH (ref 0.30–0.70)

## 2013-10-07 LAB — D-DIMER, QUANTITATIVE: D-Dimer, Quant: 18.73 ug/mL-FEU — ABNORMAL HIGH (ref 0.00–0.48)

## 2013-10-07 LAB — MAGNESIUM
MAGNESIUM: 1.8 mg/dL (ref 1.5–2.5)
Magnesium: 2.4 mg/dL (ref 1.5–2.5)

## 2013-10-07 LAB — LACTIC ACID, PLASMA
Lactic Acid, Venous: 3.7 mmol/L — ABNORMAL HIGH (ref 0.5–2.2)
Lactic Acid, Venous: 1.3 mmol/L (ref 0.5–2.2)

## 2013-10-07 LAB — APTT
APTT: 32 s (ref 24–37)
APTT: 58 s — AB (ref 24–37)

## 2013-10-07 LAB — PRO B NATRIURETIC PEPTIDE: PRO B NATRI PEPTIDE: 3141 pg/mL — AB (ref 0–125)

## 2013-10-07 LAB — PHOSPHORUS: Phosphorus: 1.6 mg/dL — ABNORMAL LOW (ref 2.3–4.6)

## 2013-10-07 MED ORDER — ROCURONIUM BROMIDE 50 MG/5ML IV SOLN
80.0000 mg | Freq: Once | INTRAVENOUS | Status: AC
  Administered 2013-10-07: 80 mg via INTRAVENOUS

## 2013-10-07 MED ORDER — NOREPINEPHRINE BITARTRATE 1 MG/ML IJ SOLN
2.0000 ug/min | INTRAVENOUS | Status: DC
  Administered 2013-10-07: 16 ug/min via INTRAVENOUS
  Administered 2013-10-07: 20 ug/min via INTRAVENOUS
  Administered 2013-10-07: 25 ug/min via INTRAVENOUS
  Filled 2013-10-07 (×3): qty 16

## 2013-10-07 MED ORDER — MIDAZOLAM HCL 2 MG/2ML IJ SOLN
INTRAMUSCULAR | Status: AC
  Filled 2013-10-07: qty 4

## 2013-10-07 MED ORDER — FENTANYL BOLUS VIA INFUSION
25.0000 ug | INTRAVENOUS | Status: DC | PRN
  Filled 2013-10-07: qty 50

## 2013-10-07 MED ORDER — PIPERACILLIN-TAZOBACTAM 3.375 G IVPB
3.3750 g | Freq: Three times a day (TID) | INTRAVENOUS | Status: DC
  Administered 2013-10-07 – 2013-10-11 (×13): 3.375 g via INTRAVENOUS
  Filled 2013-10-07 (×16): qty 50

## 2013-10-07 MED ORDER — NOREPINEPHRINE BITARTRATE 1 MG/ML IJ SOLN
2.0000 ug/min | INTRAMUSCULAR | Status: DC
  Administered 2013-10-07: 25 ug/min via INTRAVENOUS
  Filled 2013-10-07: qty 8

## 2013-10-07 MED ORDER — MAGNESIUM SULFATE 40 MG/ML IJ SOLN
2.0000 g | Freq: Once | INTRAMUSCULAR | Status: AC
  Administered 2013-10-07: 2 g via INTRAVENOUS
  Filled 2013-10-07: qty 50

## 2013-10-07 MED ORDER — SODIUM CHLORIDE 0.9 % IV BOLUS (SEPSIS)
1000.0000 mL | Freq: Once | INTRAVENOUS | Status: AC
  Administered 2013-10-07: 1000 mL via INTRAVENOUS

## 2013-10-07 MED ORDER — FENTANYL CITRATE 0.05 MG/ML IJ SOLN: 50.0000 ug | Freq: Once | INTRAMUSCULAR | Status: DC

## 2013-10-07 MED ORDER — BIOTENE DRY MOUTH MT LIQD
15.0000 mL | Freq: Four times a day (QID) | OROMUCOSAL | Status: DC
  Administered 2013-10-07 – 2013-10-08 (×4): 15 mL via OROMUCOSAL

## 2013-10-07 MED ORDER — HEPARIN BOLUS VIA INFUSION
2000.0000 [IU] | Freq: Once | INTRAVENOUS | Status: AC
  Administered 2013-10-07: 2000 [IU] via INTRAVENOUS
  Filled 2013-10-07: qty 2000

## 2013-10-07 MED ORDER — PROPOFOL 10 MG/ML IV EMUL
0.0000 ug/kg/min | INTRAVENOUS | Status: DC
  Administered 2013-10-07: 10 ug/kg/min via INTRAVENOUS
  Administered 2013-10-07: 20 ug/kg/min via INTRAVENOUS
  Filled 2013-10-07: qty 100

## 2013-10-07 MED ORDER — FENTANYL CITRATE 0.05 MG/ML IJ SOLN
100.0000 ug | Freq: Once | INTRAMUSCULAR | Status: AC
  Administered 2013-10-07: 100 ug via INTRAVENOUS

## 2013-10-07 MED ORDER — ETOMIDATE 2 MG/ML IV SOLN
20.0000 mg | Freq: Once | INTRAVENOUS | Status: AC
  Administered 2013-10-07: 20 mg via INTRAVENOUS

## 2013-10-07 MED ORDER — FENTANYL CITRATE 0.05 MG/ML IJ SOLN
50.0000 ug | INTRAMUSCULAR | Status: DC | PRN
  Administered 2013-10-07: 50 ug via INTRAVENOUS

## 2013-10-07 MED ORDER — PHENYLEPHRINE HCL 10 MG/ML IJ SOLN
30.0000 ug/min | INTRAMUSCULAR | Status: DC
  Administered 2013-10-07: 50 ug/min via INTRAVENOUS
  Filled 2013-10-07: qty 2

## 2013-10-07 MED ORDER — SODIUM CHLORIDE 0.9 % IV SOLN
0.0000 ug/h | INTRAVENOUS | Status: DC
  Administered 2013-10-07: 100 ug/h via INTRAVENOUS
  Filled 2013-10-07: qty 50

## 2013-10-07 MED ORDER — MIDAZOLAM HCL 2 MG/2ML IJ SOLN
2.0000 mg | Freq: Once | INTRAMUSCULAR | Status: AC
  Administered 2013-10-07: 2 mg via INTRAVENOUS

## 2013-10-07 MED ORDER — MAGNESIUM SULFATE 40 MG/ML IJ SOLN
INTRAMUSCULAR | Status: AC
  Filled 2013-10-07: qty 50

## 2013-10-07 MED ORDER — POTASSIUM PHOSPHATE DIBASIC 3 MMOLE/ML IV SOLN
20.0000 meq | Freq: Once | INTRAVENOUS | Status: AC
  Administered 2013-10-07: 20 meq via INTRAVENOUS
  Filled 2013-10-07: qty 4.55

## 2013-10-07 MED ORDER — FENTANYL CITRATE 0.05 MG/ML IJ SOLN
INTRAMUSCULAR | Status: AC
  Filled 2013-10-07: qty 4

## 2013-10-07 MED ORDER — PHENYLEPHRINE HCL 10 MG/ML IJ SOLN
30.0000 ug/min | INTRAVENOUS | Status: AC
  Administered 2013-10-07: 30 ug/min via INTRAVENOUS
  Filled 2013-10-07 (×2): qty 1

## 2013-10-07 MED ORDER — SODIUM BICARBONATE 8.4 % IV SOLN
50.0000 meq | Freq: Once | INTRAVENOUS | Status: AC
  Administered 2013-10-07: 50 meq via INTRAVENOUS

## 2013-10-07 MED ORDER — HEPARIN (PORCINE) IN NACL 100-0.45 UNIT/ML-% IJ SOLN
1350.0000 [IU]/h | INTRAMUSCULAR | Status: AC
  Administered 2013-10-07: 1100 [IU]/h via INTRAVENOUS
  Administered 2013-10-07 – 2013-10-08 (×2): 1250 [IU]/h via INTRAVENOUS
  Filled 2013-10-07 (×3): qty 250

## 2013-10-07 MED ORDER — VITAL AF 1.2 CAL PO LIQD
1000.0000 mL | ORAL | Status: DC
  Administered 2013-10-07 – 2013-10-08 (×2): 1000 mL
  Filled 2013-10-07 (×4): qty 1000

## 2013-10-07 MED ORDER — PANTOPRAZOLE SODIUM 40 MG IV SOLR
40.0000 mg | INTRAVENOUS | Status: DC
  Administered 2013-10-07: 40 mg via INTRAVENOUS
  Filled 2013-10-07 (×2): qty 40

## 2013-10-07 MED ORDER — POTASSIUM CHLORIDE 10 MEQ/50ML IV SOLN
10.0000 meq | INTRAVENOUS | Status: AC
  Administered 2013-10-07 (×4): 10 meq via INTRAVENOUS
  Filled 2013-10-07 (×4): qty 50

## 2013-10-07 MED ORDER — SODIUM CHLORIDE 0.9 % IV BOLUS (SEPSIS)
500.0000 mL | Freq: Once | INTRAVENOUS | Status: AC
  Administered 2013-10-07: 500 mL via INTRAVENOUS

## 2013-10-07 MED ORDER — SODIUM CHLORIDE 0.9 % IV SOLN
INTRAVENOUS | Status: DC
  Administered 2013-10-07 – 2013-10-08 (×2): 1000 mL via INTRAVENOUS

## 2013-10-07 MED ORDER — CHLORHEXIDINE GLUCONATE 0.12 % MT SOLN
15.0000 mL | Freq: Two times a day (BID) | OROMUCOSAL | Status: DC
  Administered 2013-10-07 – 2013-10-08 (×3): 15 mL via OROMUCOSAL
  Filled 2013-10-07 (×3): qty 15

## 2013-10-07 MED ORDER — MAGNESIUM SULFATE 40 MG/ML IJ SOLN
2.0000 g | Freq: Once | INTRAMUSCULAR | Status: AC
  Administered 2013-10-07: 2 g via INTRAVENOUS

## 2013-10-07 MED FILL — Sodium Bicarbonate IV Soln 8.4%: INTRAVENOUS | Qty: 50 | Status: AC

## 2013-10-07 MED FILL — Adenosine IV Soln 6 MG/2ML: INTRAVENOUS | Qty: 2 | Status: AC

## 2013-10-07 NOTE — Progress Notes (Signed)
Event: Notified at 2230 that pt had an episode while being assisted back to bed from being OOB to BR. RN reports pt c/o (L) sided CP that was associated w/ SOB, cough and diaphoresis. EKG was obtained and revealed NSR w/ rate of 84 and w/o acute changes. [name] BP noted to be 84/40 and 02 sats of 90% on 2L [place]. At the time of the initial call pt appeared to be improving. He was noted to be CP free, BP now 120/50 and 02 sats back up to 94-96% On 3-4L [place]. RR RN was paged and is currently at bedside. NP to bedside. Subjective: Pt continues to deny CP but endorses persistent coughing and SOB. RN reports that pt seemed to be improving but then quickly decompensated w/ persistent coughing and tachypnea. She relates that Dr Adela Glimpse w/ [facility] was present on the unit and was asked by RN to assist.  Objective: Mr Rabel is a 73 year old male with recent history of thoracic aortic aneurism s/p mesh stent repair, aortic valve replacement, and chf admitted for UTI/fever on 10/06/13 who developed CP and profound dyspnea after ambulating to restroom this evening. At bedside pt noted very tachypneic w/ persistent cough at times productive of clear secretions. He is pale, ashen, diaphoretic w/ rigors.  Very anxious. BBS diminished L>R. Dr Adela Glimpse is at bedside. Current VS are T-102.8, BP-137/100, P-85, R-44 w/ 02 sats of 94-96% on 4L [place]. Stat PCXR reveals mild pulmonary vascular congestion and small (L) pleural effusion (essentially unchanged from previous). ABG > pH-7.26, pC02-44.1, p02-84.9 w/ bicarb of 18.7.  Assessment/Plan: 1. Acute respiratory distress in setting of small LLL infiltrate, CHF and significant CAD w/ recent (08/2013) aortic dissection repair. PE ruled out on CTA just prior to this event. Dr Adela Glimpse discussed pt w/ CCM who have agreed to accept pt for transfer into ICU for management and possible intubation. Appreciate CCM input. Pt was transferred to 18M-13 and at the time of my departure pt was somewhat improved  but remained guarded. Will follow.  Leanne Chang, NP-C [facility] Pager [phone]

## 2013-10-07 NOTE — Significant Event (Signed)
Rapid Response Event Note  Overview: Time Called: 2211 Arrival Time: 2215 Event Type: Respiratory;Cardiac;Hypotension  Initial Focused Assessment: Received call from [place], RN 3W with concern for pt who was OOB to void and developed Left CP and hypotension 84/90.  Upon arrival to room pt was in bed, diaphoretic, ashen and CP free.  Stated pain was left chest, non radiating and rated 5/10 and sharp. EKG obtained  With rate 84 NSR. Breath sounds diminished in left base. O2 sats 90% on 2l [place]. Increased to 3l with 95% sat.  Color improving and for the next few minutes pt seemed to be improving all around.  At 2230 Temp 99.0  121/50 HR 82  RR 22.  A few minutes later pt began trembling with rigors, c/o increased SOB and pain on inspiration.  Began coughing uncontrollably  Producing small amount clear/white sputum.  O2 sats dropped to 90 and pt was placed on 4 L [place].  Dr Adela Glimpse arrived at bedside.  Pt again ashen, diaphoretic, warm to touch.  At 2240 Temp spiked to 102.8 with continued rigors and cough   BP 137/100 HR 85 and regular RR 42. Stat PCXR, ABG obtained.  ABG:  7.26 44 85 Dr Adela Glimpse consulted with  critical care.  [name], PA at bedside.  Pt tranferred to ICU 71m13 at 2310.     Interventions: Increased O2 to 4l [place], EKG, CXR, ABG, Troponin, d dimer,  Tylenol 650 suppository and transfer to ICU ordered by Dr Adela Glimpse   Event Summary: Name of Physician Notified: Tama Gander, NP and Dr Adela Glimpse at 2230  Name of Consulting Physician Notified: Dr Colletta [place] at 2245  Outcome: Transferred (Comment) (to 2M13)  Event End Time: 2315  [place] Sl[place] Twyla Dais

## 2013-10-07 NOTE — Progress Notes (Signed)
Was notified by Nursing stuff that patient was in respiratory distress.  Patient is a 73 yo m with recent aortic aneurism repair who was admitted for UTI/ left lobe pneumonia. CTA was done initially and was negative for PE. Patient have been previously on anticoagulation but it was held due to acute hg drop.  Patient operantly got up to use the bathroom and when he returned to bed he has rapidly decompensated. At first complained of chest pain but that rapidly resolved and patient started to have persistent cough and went into respiratory distress.  At bedside patient appeared to be breathing rapidly but answering questions, Vitals stable but increased oxygen requirement up to 4 L On physical exam noted diminished air movement on the left lung base which is change from prior,  Patient appeared tenuous. Heart rate regular no Murmur. LE w/o edema abdomen soft. Patient with rigors and repeat tem 102.8 Ordered ABG, CXR, ECG obtain non ischemic, CXR showed no significant change. Discussed case with CCM given clinical picture  Patient was transferred to ICU for closer monitoring. Ordered troponin and breathing treatments. patient have had recent blood cultures.   [name] [name] 3:36 AM

## 2013-10-07 NOTE — Progress Notes (Signed)
UR Completed.  Braelee Herrle Jane Taytem Ghattas [phone] 10/07/2013  

## 2013-10-07 NOTE — Progress Notes (Signed)
ANTICOAGULATION CONSULT NOTE - Initial Consult  Pharmacy Consult for Heparin Indication: atrial fibrillation  Allergies  Allergen Reactions  . Lortab [Hydrocodone-Acetaminophen] Hives  . Morphine And Related Hives    Patient Measurements: Height: 5\' 10"  (177.8 cm) Weight: 178 lb 2.1 oz (80.8 kg) IBW/kg (Calculated) : 73  Vital Signs: Temp: 97.9 F (36.6 C) (04/21 1222) Temp src: Oral (04/21 1222) BP: 137/46 mmHg (04/21 1300) Pulse Rate: 60 (04/21 1300)  Labs:  Recent Labs  10/06/13 0001 10/06/13 0546 10/06/13 2249 10/06/13 2315 10/07/13 0320 10/07/13 0540 10/07/13 1049 10/07/13 1300  HGB 8.3* 7.4*  --   --  9.1*  --   --   --   HCT 27.6* 24.7*  --   --  31.5*  --   --   --   PLT 232 198  --   --  241  --   --   --   APTT  --   --   --   --   --  32  --  58*  LABPROT  --  19.8*  --   --   --   --   --   --   INR  --  1.74*  --   --   --   --   --   --   HEPARINUNFRC  --   --   --   --   --  1.52*  --   --   CREATININE 0.83 0.82  --  0.73 0.85  --   --   --   TROPONINI  --   --  <0.30  --  <0.30  --  <0.30  --     Estimated Creatinine Clearance: 78.7 ml/min (by C-G formula based on Cr of 0.85).   Medications:  Scheduled:  . amiodarone  200 mg Oral Daily  . antiseptic oral rinse  15 mL Mouth Rinse QID  . chlorhexidine  15 mL Mouth Rinse BID  . fentaNYL  50 mcg Intravenous Once  . pantoprazole (PROTONIX) IV  40 mg Intravenous Q24H  . piperacillin-tazobactam (ZOSYN)  IV  3.375 g Intravenous Q8H  . potassium phosphate IVPB (mEq)  20 mEq Intravenous Once  . vancomycin  1,250 mg Intravenous Q12H   Current Heparin Rate: 1100 units/hr (~15 units/kg/hr)  Assessment: 73 yo male presenting to ED on 4/19 with fever/UTI. Patient is on apixaban PTA for afib (last dose 4/19). Apixaban was held on admission for anemia with hgb 7.3. On 4/20 patient became diaphoretic and endorsed chest pain so heparin gtt to start for ACS/afib. Baseline HL elevated to 1.52 d/t effect of  apixaban so will dose based on aPTT levels for now. Current aPTT low at 58s. No interruptions in gtt and no bleeding noted per nurse. H/H remains low but stable at 9.1/31.5, plt wnl.   Goal of Therapy:  APTT goal: 66-102s Heparin level 0.3-0.7 units/ml Monitor platelets by anticoagulation protocol: Yes   Plan:  - Increase heparin to 1250 units/hr (~2units/kg/hr increase) - Check aPTT in 8 hrs - Follow daily HL/aPTT - Continue to monitor H/H, s/s bleeding  Margie Billet, PharmD Clinical Pharmacist - Resident Pager: [phone] Pharmacy: [phone] 10/07/2013 3:23 PM

## 2013-10-07 NOTE — Progress Notes (Addendum)
Brief progress note  Patient developed several short-lasting episodes of V-tach in afternoon. The patient feels OK, no chest pain.  He is intubated.  Stat EKG showed QTc=794. K 3.3 in AM, which was replete with 10 X 4  MEq of KCl in AM. Mg=1.8 and he received 2 g of magnesium sulfate by IV. He was on Levophed gtt 20 mcg/min, which is decreased to 10 mcg/min now. He was also given 2g of magnesium sulfate. Stat BMP and Mg level were ordered, needs to be followed up.   Of note, patient was on metoprolol 25 mg bid at home, which was discontinued due to septic shock.  May consider to restart his home dose metoprolol if V-tach persists.   -  Zofran was discontinued. If nausea, consider hydroxyzine.  -  No Reglan from now. -  EKG in AM -  Consider to switch levophed to Neonephrine for maintaining MAP >65 in order to avoid beta receptor stimulation.  Lorretta Harp, MD PGY3, Internal Medicine Teaching Service Pager: [phone]

## 2013-10-07 NOTE — Progress Notes (Addendum)
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 He was transferred to ICU and PCCM to assume care.    SIGNIFICANT EVENTS / STUDIES:   4/19 admitted for sepsis 4/20 CTA:  thoracic aortic stent graft with exclusion of the previously seen aortic dissection. No evidence of endoleak.   Stable right upper lobe pulmonary nodule. Left lower lobe consolidation with associated effusion. Patent left common carotid artery to left subclavian artery bypass with some retrograde filling of the more proximal left subclavian artery. 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  LINES / TUBES: ETT 10/07/13>> Central line 4/21>>  CULTURES: Blood 4/19 >>> Urine 4/20 >>> Negative  ANTIBIOTICS: Cefepime 4/19 > 4/20 Vanc 4/19 >>> Zosyn 4/20 >>>  SUBJECTIVE:  4/21: The patient is intubated and sedated. Tm   VITAL SIGNS: Temp:  [98.2 F (36.8 C)-102.8 F (39.3 C)] 98.2 F (36.8 C) (04/21 0743) Pulse Rate:  [64-101] 76 (04/21 0700) Resp:  [14-42] 16 (04/21 0700) BP: (72-145)/(31-100) 140/42 mmHg (04/21 0700) SpO2:  [84 %-100 %] 99 % (04/21 0700) FiO2 (%):  [40 %-100 %] 40 % (04/21 0534) Weight:  [178 lb 2.1 oz (80.8 kg)] 178 lb 2.1 oz (80.8 kg) (04/21 0500) HEMODYNAMICS: CVP:  [1 mmHg-6 mmHg] 6 mmHg VENTILATOR SETTINGS: Vent Mode:  [-] PRVC FiO2 (%):  [40 %-100 %] 40 % Set Rate:  [16 bmp] 16 bmp Vt Set:  [500 mL-580 mL] 580 mL PEEP:  [5 cmH20] 5 cmH20 Pressure Support:  [7 cmH20] 7  cmH20 Plateau Pressure:  [21 cmH20] 21 cmH20 INTAKE / OUTPUT: Intake/Output     04/20 0701 - 04/21 0700 04/21 0701 - 04/22 0700   P.O. 660    I.V. (mL/kg) 357.7 (4.4)    Other 1000    IV Piggyback 291    Total Intake(mL/kg) 2308.7 (28.6)    Urine (mL/kg/hr) 1300 (0.7)    Total Output 1300     Net +1008.7            PHYSICAL EXAMINATION: General: intubated and sedated Neuro:  grossly non focal HEENT: no jvd Cardiovascular:  RRR tachy Lungs:  rhonchi on the right lung field anteriorly. Clear on the left lung field.  Abd:  Soft, non-tender Musculoskeletal:  No acute deformity Skin:  No rashes  LABS:  CBC  Recent Labs Lab 10/06/13 0001 10/06/13 0546 10/07/13 0320  WBC 6.1 4.8 8.6  HGB 8.3* 7.4* 9.1*  HCT 27.6* 24.7* 31.5*  PLT 232 198 241   Coag's  Recent Labs Lab 10/06/13 0546 10/07/13 0540  APTT  --  32  INR 1.74*  --    BMET  Recent Labs Lab 10/06/13 0546 10/06/13 2315 10/07/13 0320  NA 142 138 142  K 3.3* 4.2 3.3*  CL 104 102 105  CO2 25 19 22   BUN 22 21 21   CREATININE 0.82 0.73 0.85  GLUCOSE 123* 125* 153*   Electrolytes  Recent Labs Lab 10/06/13 0546 10/06/13 2315 10/07/13 0230 10/07/13 0320  CALCIUM 8.1* 8.5  --  8.4  MG  --   --  1.8  --   PHOS  --   --  1.6*  --    Sepsis Markers  Recent Labs Lab 10/06/13 0017 10/06/13 2315 10/06/13 2357 10/07/13 0030  LATICACIDVEN 2.08 3.7*  --   --   PROCALCITON  --   --  0.21 1.39   ABG  Recent Labs Lab 10/06/13 2300 10/07/13 0200 10/07/13 0500  PHART 7.266* 7.299* 7.371  PCO2ART 44.1 51.0* 39.1  PO2ART 84.9 217.0* 105.0*   Liver Enzymes  Recent Labs Lab 10/06/13 0001 10/06/13 0546  AST 79* 65*  ALT 77* 66*  ALKPHOS 90 86  BILITOT 0.6 0.4  ALBUMIN 2.9* 2.6*   Cardiac Enzymes  Recent Labs Lab 10/06/13 2249 10/07/13 0030 10/07/13 0320  TROPONINI <0.30  --  <0.30  PROBNP  --  3141.0*  --    Glucose  Recent Labs Lab 10/06/13 2327  GLUCAP 128*     Imaging Dg Chest 2 View  10/06/2013   CLINICAL DATA:  Fever of 1020.2.  Recent urinary tract infection.  EXAM: CHEST  2 VIEW  COMPARISON:  DG CHEST 1V PORT dated 08/28/2013; CT ANGIO CHEST W/CM &/OR WO/CM dated 08/22/2013; DG CHEST 1V PORT dated 08/26/2013  FINDINGS: Postoperative changes in the mediastinum with aortic valve replacement and aortic stent graft. Aortic shadow remains enlarged but appears similar to previous studies. Left pleural effusion with basilar atelectasis is similar to prior study. Right lung is clear. Cardiac enlargement with normal pulmonary vascularity.  IMPRESSION: Stable appearance of postoperative changes in the mediastinum. Cardiac enlargement. Left pleural effusion with basilar atelectasis. These changes appear stable since previous study appear   Electronically Signed   By: Burman Nieves M.D.   On: 10/06/2013 00:44   Ct Abdomen Pelvis W Contrast  10/06/2013   CLINICAL DATA:  pt with anemia, no external bleed. rule out retroperitoneal bleed, also has recurrent UTi evaluate obstruction  EXAM: CT ABDOMEN AND PELVIS WITH CONTRAST  TECHNIQUE: Multidetector CT imaging of the abdomen and pelvis was performed using the standard protocol following bolus administration of intravenous contrast.  CONTRAST:  OMNIPAQUE IOHEXOL 300 MG/ML  SOLN  COMPARISON:  CT CTA ABD/PEL W/CM AND/OR W/O CM dated 08/22/2013; DG CHEST 2 VIEW dated 10/06/2013; CT ANGIO CHEST W/CM &/OR WO/CM dated 05/18/2011; CT ANGIO CHEST W/CM &/OR WO/CM dated 05/30/2012  FINDINGS: The patient is status post aortic stent graft repair. The graft is only partially visualized. Along the superior aspect of the descending aorta a crescent-shaped area of of low attenuation (Hounsfield units 29) is appreciated adjacent to the aorta likely representing thrombus within the false lumen of the dissection. There is no evidence of active extravasation within the visualized portions of the stented aorta. The thoracic aorta is  incompletely evaluated on this study.  A small left pleural effusion is appreciated increased from prior. There has been near complete resolution of the previous left pleural effusion. Areas of increased density are appreciated within the lung bases left greater than right differential considerations are atelectasis versus infiltrates.  Atherosclerotic calcification identified within the coronary vessels.  The liver, spleen, adrenals, pancreas, and left kidney are unremarkable. A benign Bosniak type 1 cyst is appreciated within the upper pole of the right kidney. A stable 1.3 cm nonenhancing cystic appearing nodule is appreciated along the anterior lateral lower pole right kidney, stable. There is mild stranding surrounding this finding which may represent prior rupture of a cyst.  There is no  evidence of abdominal free fluid, loculated fluid collections, masses, nor adenopathy.  Bowel is negative. There is no evidence of a retroperitoneal hematoma.  There is no evidence of abdominal aortic aneurysm. The celiac, SMA, IMA, portal vein are patent. Atherosclerotic calcifications are appreciated within the abdominal aorta.  Small calcified gallstones appreciated within the dependent portion of the gallbladder.  A trace amount of fluid is appreciated within the dependent portion of the lower pelvis. No pelvic masses, loculated fluid collections nor adenopathy is appreciated. There is diverticulosis within the sigmoid colon.  A small fat containing umbilical hernia is identified. There is no evidence of an inguinal hernia.  Multilevel spondylosis within the lumbar spine. No aggressive appearing osseous lesions.  IMPRESSION: 1. Patient is status post aortic stent graft repair. The stent graft is incompletely visualized. There are findings likely reflecting thrombus within the false lumen. Active extravasation is not appreciated. Leakage within non evaluated portions of the graft cannot be excluded on this study.  Considering the patient's history further evaluation of the entire thoracic aorta and recommended with CT angiography. These results were called by telephone at the time of interpretation on 10/06/2013 at 11:43 AM to Dr. Edsel PetrinMARYANN MIKHAIL , who verbally acknowledged these results. 2. No evidence of focal or acute intra-abdominal abnormalities. A trace amount of fluid is appreciated within the pelvis this may represent reactive fluid considering the patient's history of recent surgical intervention. 3. Stable benign Bosniak type 1 cyst right kidney. Indeterminate stable low attenuating focus inferior pole right kidney which may represent a partially ruptured cyst. 4. Increased size of the patient's left pleural effusion with near complete resolution of the right. 5. Atelectasis versus infiltrate within the lung bases.   Electronically Signed   By: Salome HolmesHector  Cooper M.D.   On: 10/06/2013 11:49   Dg Chest Port 1 View  10/07/2013   CLINICAL DATA:  Intubated  EXAM: PORTABLE CHEST - 1 VIEW  COMPARISON:  DG CHEST 1V PORT dated 10/06/2013  FINDINGS: Endotracheal tube placed. Tip is at the carina. NG tube placed. Tip is in the fundus. Central hazy airspace disease has developed worrisome for developing edema. Left pleural effusion has increased. Cardiomegaly. Postoperative changes are stable. Right internal jugular central venous catheter placed with its tip in the upper SVC. No pneumothorax.  IMPRESSION: Endotracheal tube placed.  Tip is at the carina.  NG tube placed  Right internal jugular central venous catheter placed with its tip at the upper SVC and no pneumothorax  Developing airspace disease worrisome for edema or ARDS.   Electronically Signed   By: Maryclare BeanArt  Hoss M.D.   On: 10/07/2013 01:59   Dg Chest Port 1 View  10/06/2013   CLINICAL DATA:  Cough and respiratory distress.  EXAM: PORTABLE CHEST - 1 VIEW  COMPARISON:  CT ANGIO CHEST AORTA W/CM & WO/CM dated 10/06/2013; DG CHEST 2 VIEW dated 10/06/2013  FINDINGS: No  significant change since previous study. Previous postoperative changes in the mediastinum with aortic stent graft placement. Mild cardiac enlargement. Mild pulmonary vascular congestion. Small left pleural effusion with basilar atelectasis.  IMPRESSION: No significant change since prior study.   Electronically Signed   By: Burman NievesWilliam  Stevens M.D.   On: 10/06/2013 23:10   Ct Angio Chest Aortic Dissect W &/or W/o  10/06/2013   CLINICAL DATA:  Recent drop in hemoglobin, history of thoracic aortic stent graft  EXAM: CT ANGIOGRAPHY CHEST WITH CONTRAST  TECHNIQUE: Multidetector CT imaging of the chest was performed using the standard protocol  during bolus administration of intravenous contrast. Multiplanar CT image reconstructions and MIPs were obtained to evaluate the vascular anatomy.  CONTRAST:  80mL OMNIPAQUE IOHEXOL 350 MG/ML SOLN  COMPARISON:  08/22/2013  FINDINGS: A left-sided pleural effusion is seen which is increased from the prior exam. Left lower lobe consolidation secondary to the effusion is seen. No significant right-sided infiltrate is noted. 7 mm nodule is again seen in the anterior aspect of the right upper lobe best seen on image number 43. This is stable from the prior exam.  There are changes consistent with recent aortic stent graft placement. The stent is widely patent and no findings to suggest endoleak are identified. The previously seen aortic dissection has been excluded. The thrombus surrounding the stent graft in the descending thoracic aorta is slightly greater in density than the adjacent fluid although felt to be within normal limits given the relative recent surgical history. There are no findings to suggest active extravasation. The more distal thoracic aorta and proximal visualized abdominal aorta are within normal limits. Few small mediastinal lymph nodes are seen which are stable from the prior exam. The great vessels show evidence of occlusion of the subclavian artery on the left at  its origin due to the stent graft placement. Some retrograde flow in the proximal portion of the subclavian artery is noted secondary to a carotid subclavian bypass graft which appears patent.  No evidence of pulmonary emboli is identified. The visualized upper abdomen this similar to that seen on recent CT of the abdomen and pelvis.  Review of the MIP images confirms the above findings.  IMPRESSION: Changes of thoracic aortic stent graft with exclusion of the previously seen aortic dissection. No evidence to suggest endoleak is identified.  Stable right upper lobe pulmonary nodule.  Left lower lobe consolidation with associated effusion.  Patent left common carotid artery to left subclavian artery bypass with some retrograde filling of the more proximal left subclavian artery. 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   Electronically Signed   By: Alcide Clever M.D.   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   P:    Continue vanco and zosyn IV Follow Blood cx Trend WBC and fever curve  ENDOCRINE A:  No acute issues P:   Monitor Check CBG on BMet  NEUROLOGIC A:  No acute issue P:   Monitor  Attending:  I have  seen and examined the patient with Dr. Clyde Lundborg and agree with the note above.   Septic shock presumably from UTI and HCAP; appears under-resuscitated, will bolus saline now Wean levophed Stop propofol, use fentanyl gtt Wife updated at bedside  Additional CC time by me 45 minutes  [place], MD [place] PCCM Pager: [phone] Cell: [phone] If no response, call [phone]   10/07/2013, 7:45 AM  Addendum:  4:50 PM:  Patient developed several short-lasting episodes of V-tach in afternoon. The patient feels OK, no chest pain. He is intubated. Stat EKG showed QTc=794. K 3.3 in AM, which was replete with 10 X 4 MEq of KCl in AM. Mg=1.8 and he received 2 g of magnesium sulfate by IV. He was on Levophed gtt 20 mcg/min, which is decreased to 10 mcg/min now. He was also given 2g of magnesium sulfate. Stat BMP and Mg level were ordered, needs to be followed up.  Of note, patient was on metoprolol 25 mg bid at home, which was discontinued due to septic shock. May consider to restart his home dose metoprolol if V-tach persists.   Lorretta Harp, MD  PGY3, Internal Medicine Teaching Service  Pager: [phone]

## 2013-10-07 NOTE — Progress Notes (Signed)
CCM Interim Progress Note.  Updated wife at bedside.  She notified us that patients last dose of apixaban was 10p on Sunday the 19th - patient is >36 hours from last dose.    Hb stable (improved to 9.1) Concern for ACS causing rapid response, esp given h/o CAD s/p stent, so far CE (-) x 2, last CE later this morning  Will start heparin gtt for afib CVA ppx

## 2013-10-07 NOTE — Procedures (Signed)
Personally supervised procedure   Dr. Kalman Shan, M.D., [facility].C.P Pulmonary and Critical Care Medicine Staff Physician [place] [facility] [place] Pulmonary and Critical Care Pager: [phone], If no answer or between  15:00h - 7:00h: call [phone]  10/07/2013 1:12 AM

## 2013-10-07 NOTE — Procedures (Signed)
Central Venous Catheter Insertion Procedure Note [name] [name] [id number] 1940-11-05  Procedure: Insertion of Central Venous Catheter Indications: Assessment of intravascular volume, Drug and/or fluid administration and Frequent blood sampling  Procedure Details Consent: Risks of procedure as well as the alternatives and risks of each were explained to the (patient/caregiver).  Consent for procedure obtained. Time Out: Verified patient identification, verified procedure, site/side was marked, verified correct patient position, special equipment/implants available, medications/allergies/relevent history reviewed, required imaging and test results available.  Performed  Maximum sterile technique was used including antiseptics, cap, gloves, gown, hand hygiene, mask and sheet. Skin prep: Chlorhexidine; local anesthetic administered A antimicrobial bonded/coated triple lumen catheter was placed in the right internal jugular vein using the Seldinger technique. Ultrasound guidance used.yes Catheter placed to 17 cm. Blood aspirated via all 3 ports and then flushed x 3. Line sutured x 2 and dressing applied.  Evaluation Blood flow good Complications: No apparent complications Patient did tolerate procedure well. Chest X-ray ordered to verify placement.  CXR: pending.  [name] [name], ACNP [facility] Pulmonology/Critical Care Pager [phone] or [phone]

## 2013-10-07 NOTE — Procedures (Signed)
Personally supervised this procedure at bedside earlier today. Real time 2D ultrasound used for vein site selection, patency assessment, and needle entry.  A record of image was made but could not be submitted for filing due to malfunction of printing device   Dr. Kalman Shan, M.D., [facility].C.P Pulmonary and Critical Care Medicine Staff Physician [place] [facility] [place] Pulmonary and Critical Care Pager: [phone], If no answer or between  15:00h - 7:00h: call [phone]  10/07/2013 9:06 AM

## 2013-10-07 NOTE — Progress Notes (Signed)
ANTICOAGULATION CONSULT NOTE - Initial Consult  Pharmacy Consult for Heparin Indication: atrial fibrillation  Allergies  Allergen Reactions  . Lortab [Hydrocodone-Acetaminophen] Hives  . Morphine And Related Hives    Patient Measurements: Height: 5\' 10"  (177.8 cm) Weight: 172 lb 6.4 oz (78.2 kg) IBW/kg (Calculated) : 73  Vital Signs: Temp: 99 F (37.2 C) (04/21 0334) Temp src: Oral (04/21 0334) BP: 129/48 mmHg (04/21 0500) Pulse Rate: 78 (04/21 0500)  Labs:  Recent Labs  10/06/13 0001 10/06/13 0546 10/06/13 2249 10/06/13 2315 10/07/13 0320  HGB 8.3* 7.4*  --   --  9.1*  HCT 27.6* 24.7*  --   --  31.5*  PLT 232 198  --   --  241  LABPROT  --  19.8*  --   --   --   INR  --  1.74*  --   --   --   CREATININE 0.83 0.82  --  0.73 0.85  TROPONINI  --   --  <0.30  --  <0.30    Estimated Creatinine Clearance: 78.7 ml/min (by C-G formula based on Cr of 0.85).   Medications:  Scheduled:  . amiodarone  200 mg Oral Daily  . atorvastatin  10 mg Oral Daily  . fentaNYL      . fentaNYL      . hydrALAZINE  25 mg Oral BID  . metoprolol  25 mg Oral BID  . midazolam      . pantoprazole  40 mg Oral Daily  . phenazopyridine  100 mg Oral TID WC  . piperacillin-tazobactam (ZOSYN)  IV  3.375 g Intravenous Q8H  . potassium chloride SA  40 mEq Oral Daily  . tamsulosin  0.4 mg Oral BID  . vancomycin  1,250 mg Intravenous Q12H    Assessment: 73 yo male with chest pain/Afib for heparin.  Pt was on Eliquis PTA, last dose 4/19.    Goal of Therapy:  Heparin level 0.3-0.7 units/ml Monitor platelets by anticoagulation protocol: Yes   Plan:  Check heparin level and aPTT now, then start heparin 2000 units IV bolus, and 1100 units/hr.   Heparin level/aPTT in 8 hrs  [employer] Karis Rilling 10/07/2013,5:27 AM

## 2013-10-07 NOTE — Progress Notes (Signed)
INITIAL NUTRITION ASSESSMENT  DOCUMENTATION CODES Per approved criteria  -Not Applicable   INTERVENTION:  Diet clarification, NPO while intubated.  Utilize 19M PEPuP Protocol: initiate TF via OGT with Vital AF 1.2 at 25 ml/h on day 1; on day 2, increase to goal rate of 70 ml/h (1680 ml per day) to provide 2016 kcals (94% of estimated needs), 126 gm protein, 1362 ml free water daily.  NUTRITION DIAGNOSIS: Inadequate oral intake related to inability to eat as evidenced by NPO status.   Goal: Intake to meet >90% of estimated nutrition needs.  Monitor:  TF tolerance/adequacy, weight trend, labs, vent status.  Reason for Assessment: VDRF; Verbal MD Consult for TF initiation and management  73 y.o. 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 He was transferred to ICU and intubated in the early morning of 4/21.   Nutrition focused physical exam completed.  No muscle or subcutaneous fat depletion noticed.  Patient is currently intubated on ventilator support MV: 16.1 L/min Temp (24hrs), Avg:99.2 F (37.3 C), Min:97.9 F (36.6 C), Max:102.8 F (39.3 C)   Height: Ht Readings from Last 1 Encounters:  10/06/13 5\' 10"  (1.778 m)    Weight: Wt Readings from Last 1 Encounters:  10/07/13 178 lb 2.1 oz (80.8 kg)    Ideal Body Weight: 75.5 kg  % Ideal Body Weight: 107%  Wt Readings from Last 10 Encounters:  10/07/13 178 lb 2.1 oz (80.8 kg)  09/18/13 176 lb (79.833 kg)  09/04/13 176 lb (79.833 kg)  08/29/13 182 lb 3.2 oz (82.645 kg)  08/29/13 182 lb 3.2 oz (82.645 kg)  06/26/13 176 lb (79.833 kg)  05/20/13 176 lb (79.833 kg)  05/13/13 176 lb 2.4 oz (79.9 kg)  05/30/12 192 lb (87.091 kg)  05/18/11 167 lb (75.751 kg)     Usual Body Weight: 176 lb  % Usual Body Weight: 101%  BMI:  Body mass index is 25.56 kg/(m^2).  Estimated Nutritional Needs: Kcal: 2150 Protein: 115-130 gm Fluid: 2.3-2.4 L  Skin: no wounds  Diet Order: Cardiac  EDUCATION NEEDS: -Education not appropriate at this time   Intake/Output Summary (Last 24 hours) at 10/07/13 1509 Last data filed at 10/07/13 1320  Gross per 24 hour  Intake 2653.9 ml  Output   1075 ml  Net 1578.9 ml    Last BM: 4/19   Labs:   Recent Labs Lab 10/06/13 0546 10/06/13 2315 10/07/13 0230 10/07/13 0320  NA 142 138  --  142  K 3.3* 4.2  --  3.3*  CL 104 102  --  105  CO2 25 19  --  22  BUN 22 21  --  21  CREATININE 0.82 0.73  --  0.85  CALCIUM 8.1* 8.5  --  8.4  MG  --   --  1.8  --   PHOS  --   --  1.6*  --   GLUCOSE 123* 125*  --  153*    CBG (last 3)   Recent Labs  10/06/13 2327  GLUCAP 128*    Scheduled Meds: . amiodarone  200 mg Oral Daily  . antiseptic oral rinse  15 mL Mouth Rinse QID  . chlorhexidine  15 mL Mouth Rinse BID  . fentaNYL  50 mcg Intravenous Once  .  pantoprazole (PROTONIX) IV  40 mg Intravenous Q24H  . piperacillin-tazobactam (ZOSYN)  IV  3.375 g Intravenous Q8H  . potassium phosphate IVPB (mEq)  20 mEq Intravenous Once  . vancomycin  1,250 mg Intravenous Q12H    Continuous Infusions: . sodium chloride 1,000 mL (10/07/13 0616)  . fentaNYL infusion INTRAVENOUS 25 mcg/hr (10/07/13 1012)  . heparin 1,100 Units/hr (10/07/13 0616)  . norepinephrine (LEVOPHED) Adult infusion 14 mcg/min (10/07/13 1320)  . phenylephrine (NEO-SYNEPHRINE) Adult infusion      Past Medical History  Diagnosis Date  . Hyperlipidemia   . DVT (deep venous thrombosis)     post knee surgery  . Tobacco abuse   . HTN (hypertension)   . Descending aortic aneurysm   . Aortic valve insufficiency   . Thoracic aneurysm   . S/P AVR (aortic valve replacement)   . CAD (coronary artery disease)   . Atrial flutter   . CHF  (congestive heart failure)     Past Surgical History  Procedure Laterality Date  . Right groin lymphocele  [id number]  . Aortic valve replacement  [id number]  . Replacement total knee      bilateral  . Carotid-subclavian bypass graft Left 08/26/2013    Procedure: BYPASS GRAFT CAROTID-SUBCLAVIAN;  Surgeon: Nada LibmanVance W Brabham, MD;  [facility];  Service: Vascular;  Laterality: Left;  . Endovascular stent insertion N/A 08/26/2013    Procedure:  THORACIC STENT GRAFT INSERTION;  Surgeon: Nada LibmanVance W Brabham, MD;  [facility];  Service: Vascular;  Laterality: N/A;     Joaquin CourtsKimberly Rehana Uncapher, RD, LDN, CNSC Pager [phone] After Hours Pager [phone]

## 2013-10-07 NOTE — Progress Notes (Signed)
Event Note-10/06/2013 @ 2200 -In room assisting pt to stand at bedside to urinate and noted pt to have visible sweat on forehead. Pt stated he was fine and denied any complaints at that time.  Shortly after pt was assisted into bed and stated he was having chest pain and difficulty breathing. VS as follows : T 98.9  HR 86 BP 80/42 O2 sat 90% on 2L/[place].  Pt states he felt "woozy" just prior to Chest Pain. .  EKG obtained with no acute changes noted. Rapid Response Paged.  2220 VS as follows: T 99.0 HR 79  BP 84/50 O2 sat 84% on 2L/[place].  Oxygen increased to 4L/[place] with sat of 94%. Pt remained very diaphoretic/grey/pale color, stated chest pain had resolved but continued to c/o of shortness of breath. 2225  TRH on call K. Schorr NP paged and notified of above. VS at that time T 99.0  BP 121/50  HR 79 95% on 3L/[place].  2235 Pt remained very short of breath, tachypnea, and had developed constant cough and rigors. Dr.Doutova on floor and asked to assist/evaluate pt . 2240 Pts wife [name]) called and informed/notified of up coming transfer to ICU.  Orders Placed for Stat PCXR, ABG, Troponin---2240 Vs as follows T 102.8 BP 137/100 HR 85 RR42 sat 96% on 4/L/[place]. PCXR obtained/ ABG obtained. PCCM on floor to assess pt. 2305 Pt transferred to 2M03. Dierdre Highman, RN

## 2013-10-07 NOTE — Progress Notes (Signed)
[facility]  Subjective  - He is intubated and sedated.     Objective 140/42 76 98.2 F (36.8 C) (Oral) 16 99%  Intake/Output Summary (Last 24 hours) at 10/07/13 0744 Last data filed at 10/07/13 0700  Gross per 24 hour  Intake 2308.7 ml  Output   1175 ml  Net 1133.7 ml    Palpable radial pulses bil. Left neck incision not visible due to positioning this am.  Assessment/Planning: 08/26/2013  Surgeon: Jorge Ny  Co-surgeon: Ofilia Neas  Procedure: #1: Left carotid subclavian bypass with 8 mm dacryon  #2: Ligation of proximal left subclavian artery  #3: Ultrasound-guided access, left common femoral artery  #4: Catheter in aorta x2  #5: Aortic arch angiogram  #6: Endovascular repair of thoracic aortic aneurysm  #7: Distal extension x1  Admitted for fevers and chills.   Lars Mage 10/07/2013 7:44 AM --  Laboratory Lab Results:  Recent Labs  10/06/13 0546 10/07/13 0320  WBC 4.8 8.6  HGB 7.4* 9.1*  HCT 24.7* 31.5*  PLT 198 241   BMET  Recent Labs  10/06/13 2315 10/07/13 0320  NA 138 142  K 4.2 3.3*  CL 102 105  CO2 19 22  GLUCOSE 125* 153*  BUN 21 21  CREATININE 0.73 0.85  CALCIUM 8.5 8.4    COAG Lab Results  Component Value Date   INR 1.74* 10/06/2013   INR 1.22 08/27/2013   INR 1.59* 08/22/2013   No results found for this basename: PTT

## 2013-10-07 NOTE — Progress Notes (Addendum)
T 98.5 HR 63 RR 32 BP 128/70 Patient had several episodes of NSVT. He was found to be hypokalemic (K=3.3). This was supplemented. He also had hypomagnesemia and this also was supplemented Levophed as been decreased to 10 mcg/min an his now off. He is intubated but awake and alert. CT result reviewed (Changes of thoracic aortic stent graft with exclusion of the previously seen aortic dissection. No evidence to suggest endoleak is identified. Stable right upper lobe pulmonary nodule. Left lower lobe consolidation with associated effusion. Patent left common carotid artery to left subclavian artery bypass with some retrograde filling of the more proximal left subclavian artery. 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 Dr Tyrone Sage aware of admission. Please call if any problems.  Patient seen and CT reviewed I have seen and examined [name] and agree with the above assessment  and plan.  Delight Ovens MD Beeper [phone] Office [phone] 10/07/2013 5:59 PM

## 2013-10-07 NOTE — Procedures (Signed)
Intubation Procedure Note [name] [name] [id number] 09-20-1940  Procedure: Intubation Indications: Airway protection and maintenance  Procedure Details Consent: Unable to obtain consent because of emergent medical necessity. Time Out: Verified patient identification, verified procedure, site/side was marked, verified correct patient position, special equipment/implants available, medications/allergies/relevent history reviewed, required imaging and test results available.  Performed  MAC - glide scope   Evaluation Hemodynamic Status: BP stable throughout; O2 sats: stable throughout Patient's Current Condition: stable Complications: No apparent complications Patient did tolerate procedure well. Chest X-ray ordered to verify placement.  CXR: pending.   [name] Heman, MD (PGY3, IMTS) 10/07/2013

## 2013-10-08 ENCOUNTER — Inpatient Hospital Stay (HOSPITAL_COMMUNITY): Payer: Medicare Other

## 2013-10-08 DIAGNOSIS — I2510 Atherosclerotic heart disease of native coronary artery without angina pectoris: Secondary | ICD-10-CM

## 2013-10-08 DIAGNOSIS — I7100 Dissection of unspecified site of aorta: Secondary | ICD-10-CM

## 2013-10-08 DIAGNOSIS — I4729 Other ventricular tachycardia: Secondary | ICD-10-CM

## 2013-10-08 DIAGNOSIS — I472 Ventricular tachycardia: Secondary | ICD-10-CM

## 2013-10-08 DIAGNOSIS — J90 Pleural effusion, not elsewhere classified: Secondary | ICD-10-CM

## 2013-10-08 DIAGNOSIS — Z86718 Personal history of other venous thrombosis and embolism: Secondary | ICD-10-CM

## 2013-10-08 DIAGNOSIS — J9600 Acute respiratory failure, unspecified whether with hypoxia or hypercapnia: Secondary | ICD-10-CM

## 2013-10-08 DIAGNOSIS — I700 Atherosclerosis of aorta: Secondary | ICD-10-CM

## 2013-10-08 LAB — PHOSPHORUS: Phosphorus: 2.1 mg/dL — ABNORMAL LOW (ref 2.3–4.6)

## 2013-10-08 LAB — GLUCOSE, CAPILLARY
Glucose-Capillary: 104 mg/dL — ABNORMAL HIGH (ref 70–99)
Glucose-Capillary: 106 mg/dL — ABNORMAL HIGH (ref 70–99)
Glucose-Capillary: 106 mg/dL — ABNORMAL HIGH (ref 70–99)
Glucose-Capillary: 112 mg/dL — ABNORMAL HIGH (ref 70–99)
Glucose-Capillary: 122 mg/dL — ABNORMAL HIGH (ref 70–99)

## 2013-10-08 LAB — CBC
HCT: 25.8 % — ABNORMAL LOW (ref 39.0–52.0)
Hemoglobin: 7.8 g/dL — ABNORMAL LOW (ref 13.0–17.0)
MCH: 27.3 pg (ref 26.0–34.0)
MCHC: 30.2 g/dL (ref 30.0–36.0)
MCV: 90.2 fL (ref 78.0–100.0)
Platelets: 208 10*3/uL (ref 150–400)
RBC: 2.86 MIL/uL — ABNORMAL LOW (ref 4.22–5.81)
RDW: 18.2 % — ABNORMAL HIGH (ref 11.5–15.5)
WBC: 4.9 10*3/uL (ref 4.0–10.5)

## 2013-10-08 LAB — MAGNESIUM: MAGNESIUM: 2.7 mg/dL — AB (ref 1.5–2.5)

## 2013-10-08 LAB — BASIC METABOLIC PANEL
BUN: 19 mg/dL (ref 6–23)
BUN: 17 mg/dL (ref 6–23)
CALCIUM: 7.4 mg/dL — AB (ref 8.4–10.5)
CO2: 19 mEq/L (ref 19–32)
CO2: 22 mEq/L (ref 19–32)
Calcium: 7.1 mg/dL — ABNORMAL LOW (ref 8.4–10.5)
Chloride: 109 mEq/L (ref 96–112)
Chloride: 109 mEq/L (ref 96–112)
Creatinine, Ser: 0.62 mg/dL (ref 0.50–1.35)
Creatinine, Ser: 0.59 mg/dL (ref 0.50–1.35)
GFR calc Af Amer: >90 mL/min (ref >90)
GFR calc Af Amer: >90 mL/min (ref >90)
GFR calc non Af Amer: >90 mL/min (ref >90)
Glucose, Bld: 108 mg/dL — ABNORMAL HIGH (ref 70–99)
Glucose, Bld: 104 mg/dL — ABNORMAL HIGH (ref 70–99)
Potassium: 3.3 mEq/L — ABNORMAL LOW (ref 3.7–5.3)
Potassium: 4.1 mEq/L (ref 3.7–5.3)
Sodium: 140 mEq/L (ref 137–147)
Sodium: 142 mEq/L (ref 137–147)

## 2013-10-08 LAB — HEPATIC FUNCTION PANEL
ALK PHOS: 87 U/L (ref 39–117)
ALT: 87 U/L — ABNORMAL HIGH (ref 0–53)
AST: 95 U/L — ABNORMAL HIGH (ref 0–37)
Albumin: 2.1 g/dL — ABNORMAL LOW (ref 3.5–5.2)
BILIRUBIN DIRECT: 0.2 mg/dL (ref 0.0–0.3)
BILIRUBIN INDIRECT: 0.3 mg/dL (ref 0.3–0.9)
Total Bilirubin: 0.5 mg/dL (ref 0.3–1.2)
Total Protein: 5.1 g/dL — ABNORMAL LOW (ref 6.0–8.3)

## 2013-10-08 LAB — VANCOMYCIN, TROUGH: VANCOMYCIN TR: 16.5 ug/mL (ref 10.0–20.0)

## 2013-10-08 LAB — HEPARIN LEVEL (UNFRACTIONATED): Heparin Unfractionated: 0.7 IU/mL (ref 0.30–0.70)

## 2013-10-08 LAB — APTT
aPTT: 62 seconds — ABNORMAL HIGH (ref 24–37)
aPTT: 75 seconds — ABNORMAL HIGH (ref 24–37)

## 2013-10-08 LAB — CORTISOL: Cortisol, Plasma: 10.3 ug/dL

## 2013-10-08 LAB — LACTIC ACID, PLASMA: Lactic Acid, Venous: 1 mmol/L (ref 0.5–2.2)

## 2013-10-08 LAB — PROCALCITONIN: PROCALCITONIN: 21.26 ng/mL

## 2013-10-08 MED ORDER — CHLORHEXIDINE GLUCONATE 0.12 % MT SOLN: 15.0000 mL | Freq: Two times a day (BID) | OROMUCOSAL | Status: DC

## 2013-10-08 MED ORDER — PANTOPRAZOLE SODIUM 40 MG PO PACK
40.0000 mg | PACK | Freq: Every day | ORAL | Status: DC
  Administered 2013-10-08: 40 mg
  Filled 2013-10-08 (×2): qty 20

## 2013-10-08 MED ORDER — BIOTENE DRY MOUTH MT LIQD
15.0000 mL | Freq: Two times a day (BID) | OROMUCOSAL | Status: DC
  Administered 2013-10-08 – 2013-10-09 (×3): 15 mL via OROMUCOSAL

## 2013-10-08 MED ORDER — TAMSULOSIN HCL 0.4 MG PO CAPS
0.4000 mg | ORAL_CAPSULE | Freq: Two times a day (BID) | ORAL | Status: DC
  Administered 2013-10-08 – 2013-10-11 (×7): 0.4 mg via ORAL
  Filled 2013-10-08 (×9): qty 1

## 2013-10-08 MED ORDER — POTASSIUM PHOSPHATE DIBASIC 3 MMOLE/ML IV SOLN
20.0000 meq | Freq: Once | INTRAVENOUS | Status: AC
  Administered 2013-10-08: 20 meq via INTRAVENOUS
  Filled 2013-10-08: qty 4.55

## 2013-10-08 MED ORDER — METOPROLOL TARTRATE 12.5 MG HALF TABLET
12.5000 mg | ORAL_TABLET | Freq: Two times a day (BID) | ORAL | Status: DC
  Administered 2013-10-08: 12.5 mg via ORAL
  Filled 2013-10-08 (×3): qty 1

## 2013-10-08 MED ORDER — VANCOMYCIN HCL 10 G IV SOLR
1250.0000 mg | Freq: Two times a day (BID) | INTRAVENOUS | Status: DC
  Administered 2013-10-08 – 2013-10-10 (×6): 1250 mg via INTRAVENOUS
  Filled 2013-10-08 (×9): qty 1250

## 2013-10-08 MED ORDER — APIXABAN 5 MG PO TABS
5.0000 mg | ORAL_TABLET | Freq: Two times a day (BID) | ORAL | Status: DC
  Administered 2013-10-08 (×2): 5 mg via ORAL
  Filled 2013-10-08 (×4): qty 1

## 2013-10-08 NOTE — Progress Notes (Signed)
PULMONARY / CRITICAL CARE MEDICINE   Name: [name] [name] MRN: [id number] DOB: [birth year]    ADMISSION DATE:  10/05/2013 CONSULTATION DATE: 10/06/13 ADMISSION DATE:  10/05/2013 CONSULTATION DATE:  10/06/2013  REFERRING MD : [facility] PRIMARY SERVICE: PCCM  CHIEF COMPLAINT:  Dyspnea  BRIEF PATIENT DESCRIPTION: 73 year old male with recent history of thoracic aortic aneurism s/p mesh stent repair, aortic valve replacement, and chf admitted for sepsis 4/19 developed CP and profound dyspnea after ambulating to restroom 4/20. He was transferred to ICU and PCCM to assume care.    SIGNIFICANT EVENTS / STUDIES:   4/19 Admitted for VDRF with shock requiring pressor support 4/20 CTA:  thoracic aortic stent graft with exclusion of the previously seen aortic dissection. No evidence of endoleak.   Stable right upper lobe pulmonary nodule. Left lower lobe consolidation with associated effusion. Patent left common carotid artery to left subclavian artery bypass with some retrograde filling of the more proximal left subclavian artery. 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 4/22 Plan for extubation.  LINES / TUBES: ETT 10/07/13>> Central line 4/21>>  CULTURES: Blood 4/19 >>> Urine 4/20 >>> Negative  ANTIBIOTICS: Cefepime 4/19 > 4/20 Vanc 4/19 >>> Zosyn 4/20 >>>  SUBJECTIVE:  Pt intubated, alert, and follows commands.  No current complaints, wants tube out.   VITAL SIGNS: Temp:  [97.7 F (36.5 C)-98.7 F (37.1 C)] 97.7 F (36.5 C) (04/22 0806) Pulse Rate:  [55-100] 97 (04/22 0700) Resp:  [16-37] 23 (04/22 0700) BP: (66-164)/(38-70) 155/61 mmHg (04/22 0700) SpO2:  [98 %-100 %] 100 % (04/22 0700) FiO2 (%):  [40 %] 40 % (04/22 0342) Weight:  [82.8 kg (182 lb 8.7 oz)] 82.8 kg (182 lb 8.7 oz) (04/22 0500) HEMODYNAMICS: CVP:  [6 mmHg-8 mmHg] 6 mmHg VENTILATOR SETTINGS: Vent Mode:  [-] PRVC FiO2 (%):  [40 %] 40 % Set Rate:  [16 bmp] 16 bmp Vt Set:   [580 mL] 580 mL PEEP:  [5 cmH20] 5 cmH20 Plateau Pressure:  [10 cmH20-23 cmH20] 18 cmH20 INTAKE / OUTPUT: Intake/Output     04/21 0701 - 04/22 0700 04/22 0701 - 04/23 0700   P.O.     I.V. (mL/kg) 2908.1 (35.1)    Other     NG/GT 375    IV Piggyback 1152    Total Intake(mL/kg) 4435.1 (53.6)    Urine (mL/kg/hr) 1025 (0.5)    Total Output 1025     Net +3410.1            PHYSICAL EXAMINATION: General: intubated, alert, NAD Neuro: no focal deficits  HEENT: wnl Cardiovascular:  regular rate and rhythm  Lungs:Clear to auscultation anterior fields Abd:  Soft, non-tender, non-distended, normal BS Musculoskeletal: No edema Skin:  No rashes  LABS:  CBC  Recent Labs Lab 10/06/13 0546 10/07/13 0320 10/08/13 0415  WBC 4.8 8.6 4.9  HGB 7.4* 9.1* 7.8*  HCT 24.7* 31.5* 25.8*  PLT 198 241 208   Coag's  Recent Labs Lab 10/06/13 0546  10/07/13 1300 10/07/13 2330 10/08/13 0415  APTT  --   < > 58* 75* 62*  INR 1.74*  --   --   --   --   < > = values in this interval not displayed. BMET  Recent Labs Lab 10/07/13 0320 10/07/13 1625 10/08/13 0415  NA 142 139 140  K 3.3* 4.2 3.3*  CL 105 106 109  CO2 22 20 19   BUN 21 20 19   CREATININE  0.85 0.72 0.62  GLUCOSE 153* 118* 108*   Electrolytes  Recent Labs Lab 10/07/13 0230 10/07/13 0320 10/07/13 1625 10/08/13 0415  CALCIUM  --  8.4 7.4* 7.1*  MG 1.8  --  2.4 2.7*  PHOS 1.6*  --   --  2.1*   Sepsis Markers  Recent Labs Lab 10/06/13 2315 10/06/13 2357 10/07/13 0030 10/07/13 1430 10/08/13 0415  LATICACIDVEN 3.7*  --   --  1.3 1.0  PROCALCITON  --  0.21 1.39  --  21.26   ABG  Recent Labs Lab 10/06/13 2300 10/07/13 0200 10/07/13 0500  PHART 7.266* 7.299* 7.371  PCO2ART 44.1 51.0* 39.1  PO2ART 84.9 217.0* 105.0*   Liver Enzymes  Recent Labs Lab 10/06/13 0001 10/06/13 0546 10/08/13 0415  AST 79* 65* 95*  ALT 77* 66* 87*  ALKPHOS 90 86 87  BILITOT 0.6 0.4 0.5  ALBUMIN 2.9* 2.6* 2.1*    Cardiac Enzymes  Recent Labs Lab 10/06/13 2249 10/07/13 0030 10/07/13 0320 10/07/13 1049  TROPONINI <0.30  --  <0.30 <0.30  PROBNP  --  3141.0*  --   --    Glucose  Recent Labs Lab 10/06/13 2327 10/07/13 1749 10/07/13 2210 10/08/13 0010 10/08/13 0415 10/08/13 0743  GLUCAP 128* 111* 135* 112* 106* 122*    Imaging Ct Abdomen Pelvis W Contrast  10/06/2013   CLINICAL DATA:  pt with anemia, no external bleed. rule out retroperitoneal bleed, also has recurrent UTi evaluate obstruction  EXAM: CT ABDOMEN AND PELVIS WITH CONTRAST  TECHNIQUE: Multidetector CT imaging of the abdomen and pelvis was performed using the standard protocol following bolus administration of intravenous contrast.  CONTRAST:  OMNIPAQUE IOHEXOL 300 MG/ML  SOLN  COMPARISON:  CT CTA ABD/PEL W/CM AND/OR W/O CM dated 08/22/2013; DG CHEST 2 VIEW dated 10/06/2013; CT ANGIO CHEST W/CM &/OR WO/CM dated 05/18/2011; CT ANGIO CHEST W/CM &/OR WO/CM dated 05/30/2012  FINDINGS: The patient is status post aortic stent graft repair. The graft is only partially visualized. Along the superior aspect of the descending aorta a crescent-shaped area of of low attenuation (Hounsfield units 29) is appreciated adjacent to the aorta likely representing thrombus within the false lumen of the dissection. There is no evidence of active extravasation within the visualized portions of the stented aorta. The thoracic aorta is incompletely evaluated on this study.  A small left pleural effusion is appreciated increased from prior. There has been near complete resolution of the previous left pleural effusion. Areas of increased density are appreciated within the lung bases left greater than right differential considerations are atelectasis versus infiltrates.  Atherosclerotic calcification identified within the coronary vessels.  The liver, spleen, adrenals, pancreas, and left kidney are unremarkable. A benign Bosniak type 1 cyst is appreciated within  the upper pole of the right kidney. A stable 1.3 cm nonenhancing cystic appearing nodule is appreciated along the anterior lateral lower pole right kidney, stable. There is mild stranding surrounding this finding which may represent prior rupture of a cyst.  There is no evidence of abdominal free fluid, loculated fluid collections, masses, nor adenopathy.  Bowel is negative. There is no evidence of a retroperitoneal hematoma.  There is no evidence of abdominal aortic aneurysm. The celiac, SMA, IMA, portal vein are patent. Atherosclerotic calcifications are appreciated within the abdominal aorta.  Small calcified gallstones appreciated within the dependent portion of the gallbladder.  A trace amount of fluid is appreciated within the dependent portion of the lower pelvis. No pelvic masses, loculated fluid collections  nor adenopathy is appreciated. There is diverticulosis within the sigmoid colon.  A small fat containing umbilical hernia is identified. There is no evidence of an inguinal hernia.  Multilevel spondylosis within the lumbar spine. No aggressive appearing osseous lesions.  IMPRESSION: 1. Patient is status post aortic stent graft repair. The stent graft is incompletely visualized. There are findings likely reflecting thrombus within the false lumen. Active extravasation is not appreciated. Leakage within non evaluated portions of the graft cannot be excluded on this study. Considering the patient's history further evaluation of the entire thoracic aorta and recommended with CT angiography. These results were called by telephone at the time of interpretation on 10/06/2013 at 11:43 AM to Dr. Edsel Petrin , who verbally acknowledged these results. 2. No evidence of focal or acute intra-abdominal abnormalities. A trace amount of fluid is appreciated within the pelvis this may represent reactive fluid considering the patient's history of recent surgical intervention. 3. Stable benign Bosniak type 1 cyst right  kidney. Indeterminate stable low attenuating focus inferior pole right kidney which may represent a partially ruptured cyst. 4. Increased size of the patient's left pleural effusion with near complete resolution of the right. 5. Atelectasis versus infiltrate within the lung bases.   Electronically Signed   By: Salome Holmes M.D.   On: 10/06/2013 11:49   Dg Chest Port 1 View  10/08/2013   CLINICAL DATA:  ET tube check  EXAM: PORTABLE CHEST - 1 VIEW  COMPARISON:  DG CHEST 1V PORT dated 10/07/2013; CT ANGIO CHEST W/CM &/OR WO/CM dated 08/22/2013; DG CHEST 1V PORT dated 05/04/2013; DG CHEST 1V PORT dated 05/12/2013; DG CHEST 2 VIEW dated 05/09/2013  FINDINGS: There is an endotracheal tube with the tip 7.5 cm above the carina. There is a nasogastric tube coursing below the diaphragm. There is a right jugular central venous catheter with the tip projecting over the SVC.  There is bilateral diffuse interstitial thickening. There is a small left pleural effusion and left basilar airspace disease likely representing atelectasis. There is hazy airspace disease adjacent to the aortic arch in the left lung which may reflect atelectasis versus false lumen from the aortic dissection. . There is no pneumothorax. There is an aortic stent graft noted. Prior median sternotomy. Prosthetic aortic valve is noted.  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  IMPRESSION: 1. Endotracheal tube 7.5 cm above the carina. 2. Stable pulmonary edema.  Stable cardiomegaly.   Electronically Signed   By: Elige Ko   On: 10/08/2013 07:45   Dg Chest Port 1 View  10/07/2013   CLINICAL DATA:  Intubated  EXAM: PORTABLE CHEST - 1 VIEW  COMPARISON:  DG CHEST 1V PORT dated 10/06/2013  FINDINGS: Endotracheal tube placed. Tip is at the carina. NG tube placed. Tip is in the fundus. Central hazy airspace disease has developed worrisome for developing edema. Left pleural effusion has increased. Cardiomegaly. Postoperative changes are stable.  Right internal jugular central venous catheter placed with its tip in the upper SVC. No pneumothorax.  IMPRESSION: Endotracheal tube placed.  Tip is at the carina.  NG tube placed  Right internal jugular central venous catheter placed with its tip at the upper SVC and no pneumothorax  Developing airspace disease worrisome for edema or ARDS.   Electronically Signed   By: Maryclare Bean M.D.   On: 10/07/2013 01:59   Dg Chest Port 1 View  10/06/2013   CLINICAL DATA:  Cough and respiratory distress.  EXAM: PORTABLE CHEST - 1  VIEW  COMPARISON:  CT ANGIO CHEST AORTA W/CM & WO/CM dated 10/06/2013; DG CHEST 2 VIEW dated 10/06/2013  FINDINGS: No significant change since previous study. Previous postoperative changes in the mediastinum with aortic stent graft placement. Mild cardiac enlargement. Mild pulmonary vascular congestion. Small left pleural effusion with basilar atelectasis.  IMPRESSION: No significant change since prior study.   Electronically Signed   By: Burman Nieves M.D.   On: 10/06/2013 23:10   Ct Angio Chest Aortic Dissect W &/or W/o  10/06/2013   CLINICAL DATA:  Recent drop in hemoglobin, history of thoracic aortic stent graft  EXAM: CT ANGIOGRAPHY CHEST WITH CONTRAST  TECHNIQUE: Multidetector CT imaging of the chest was performed using the standard protocol during bolus administration of intravenous contrast. Multiplanar CT image reconstructions and MIPs were obtained to evaluate the vascular anatomy.  CONTRAST:  80mL OMNIPAQUE IOHEXOL 350 MG/ML SOLN  COMPARISON:  08/22/2013  FINDINGS: A left-sided pleural effusion is seen which is increased from the prior exam. Left lower lobe consolidation secondary to the effusion is seen. No significant right-sided infiltrate is noted. 7 mm nodule is again seen in the anterior aspect of the right upper lobe best seen on image number 43. This is stable from the prior exam.  There are changes consistent with recent aortic stent graft placement. The stent is widely patent  and no findings to suggest endoleak are identified. The previously seen aortic dissection has been excluded. The thrombus surrounding the stent graft in the descending thoracic aorta is slightly greater in density than the adjacent fluid although felt to be within normal limits given the relative recent surgical history. There are no findings to suggest active extravasation. The more distal thoracic aorta and proximal visualized abdominal aorta are within normal limits. Few small mediastinal lymph nodes are seen which are stable from the prior exam. 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 Some retrograde flow in the proximal portion of the subclavian artery is noted secondary to a carotid subclavian bypass graft which appears patent.  No evidence of pulmonary emboli is identified. The visualized upper abdomen this similar to that seen on recent CT of the abdomen and pelvis.  Review of the MIP images confirms the above findings.  IMPRESSION: Changes of thoracic aortic stent graft with exclusion of the previously seen aortic dissection. No evidence to suggest endoleak is identified.  Stable right upper lobe pulmonary nodule.  Left lower lobe consolidation with associated effusion.  Patent left common carotid artery to left subclavian artery bypass with some retrograde filling of the more proximal left subclavian artery. 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   Electronically Signed   By: Alcide Clever M.D.   On: 10/06/2013 16:58    ASSESSMENT / PLAN:  ASSESSMENT / PLAN:  PULMONARY A:   Acute respiratory failure requiring ventilator support  HCAP Small left pleural effusion Elevated d-dimer (18.73 on 4/20)   -LE DUS negative P:   Extubate today  Supplemental O2 to keep SpO2 >92% PT consult  CARDIOVASCULAR A:   Septic vs hypovolemic Shock   -bp currently stable, off  pressors since 1 AM Vtach on 4/21 - resolved   - Troponin x3 neg, Mg and K repleted Chronic Prolonged QT   - 794, improved to 527 today Grade 3 Diastolic CHF   - on 2D-echo 08/2013  Atrial Fibrillation on  apixaban and amiodarone  S/p aortic valve replacement on apixaban at home 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  - no evidence of leakage on CT  P:  D/c IV heparin, restart home apixaban D/C Neo   Continue NS 19300mL/hr NS bolus as needed to keep MAP 10-12 Continue home amiodarone Avoid QT prolonging medications  Cardiology consult   RENAL A:    Hypokalemia  Hypophosphatemia  Low Magnesium - resolved Anion Gap Metabolic acidosis - resolved Lactic acidosis - resolved P:   Resume home flomax Monitor BMP Replete electrolytes as needed  GASTROINTESTINAL A:GERD P:    NPO, if passes swallow study resume heart healthy diet Cont PPI  HEMATOLOGIC A:    Normocytic Anemia P:  Follow CBC   Continue heparin, consider transition to home apixaban   INFECTIOUS A:    Severe sepsis  - resolved    - most likely due to HCAP and recent UTI   - PCT 21.26 from 1.39 P:   Continue vanco and zosyn IV F/U blood cx and PCT Follow CBC  ENDOCRINE A:  Cortisol level 10.3 on 4/21 P:   Monitor BMP Consider stress steroids   NEUROLOGIC A: Sedation on mechanical ventilation P:   Fentantyl drip and PRN Ambien PRN insomnia   Marjan Rabbani PGY-1 IMTS  10/08/2013, 8:06 AM    Attending:  I have seen and examined the patient with nurse practitioner/resident and agree with the note above.   Doing much better Extubate today Cards consult PT consult Wife updated  CC time 35 minutes  Heber CarolinaBrent McQuaid, MD [place] [facility] Pager: [phone] Cell: [phone] If no response, call [phone]

## 2013-10-08 NOTE — Progress Notes (Signed)
VASCULAR LAB PRELIMINARY  PRELIMINARY  PRELIMINARY  PRELIMINARY  Bilateral lower extremity venous duplex  completed.    Preliminary report:  Bilateral:  No evidence of DVT, superficial thrombosis, or Baker's Cyst.    Gara Kroner, RVT 10/08/2013, 9:33 AM

## 2013-10-08 NOTE — Consult Note (Signed)
Personally seen and examined, and I agree with above.   Complex CV history. AVR (bio), ascending and arch/proximal descending stent graft.  QT was in the setting of NSVT (torsades) likely exacerbated by metabolic derangement in the seting of septic shock.  Keep K >4, Mag>2.  ECG this am demonstrates much improved QT interval.  Note: has a history of post op VT after cardiac surgery. ([name] note reviewed).  Recent ECHO in March - EF 50% Continue with AMIODARONE and metoprolol.  Will follow.   Donato Schultz, MD

## 2013-10-08 NOTE — Consult Note (Signed)
Reason for Consult: VT Referring Physician: [facility]  Primary Cardiologist: Dr. Irish Lack  HPI: The patient is a 73 year old male, followed by Dr. 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 At that time, he underwent aortic valve replacement with replacement of the aortic root and ascending aorta and replacement of aortic arch to the left subclavian artery. This has been followed by Dr. Servando Snare. In Feb. 2012, he had an acute myocardial infarction and underwent PCI + insertion of a BMS in the mid circumflex. In November 2014, he presented to the [facility] emergency room with an acute type III aortic dissection, which was treated medically. During his hospital stay he did develop persistent atrial fibrillation and ultimately was discharged home on Amiodarone and Eliquis.    In addition to his cardiac and vascular issues, he has a h/o urinary retention and recurrent UTIs. He presented to [facility] on 10/06/13 with a complaint of dysuria and fever. UA suggested a UTI. He became septic and required intubation (extubated 4/22) He was placed IV antibiotics. Yesterday, he was noted to have several episodes of VT on telemetry, the longest being a 20 beat run, and appears to be torsades. This is in the setting of prolonged QT ( 794) and hypokalemia (3.3), in the setting of metabolic derangement . Mg has been WNL.    He states that he was completely asymptomatic during episodes, denying palpitations, chest pain, diaphoresis, syncope/ near syncope.   Electrolytes were repleated and Qt prolongation improved to the 4-500s. He has had no further recurrence today.    Past Medical History  Diagnosis Date  . Hyperlipidemia   . DVT (deep venous thrombosis)     post knee surgery  . Tobacco abuse   . HTN (hypertension)   . Descending aortic aneurysm   . Aortic valve insufficiency   . Thoracic aneurysm   . S/P AVR (aortic valve replacement)     . CAD (coronary artery disease)   . Atrial flutter   . CHF (congestive heart failure)     Past Surgical History  Procedure Laterality Date  . Right groin lymphocele  [id number]  . Aortic valve replacement  [id number]  . Replacement total knee      bilateral  . Carotid-subclavian bypass graft Left 08/26/2013    Procedure: BYPASS GRAFT CAROTID-SUBCLAVIAN;  Surgeon: Serafina Mitchell, MD;  Location: [place];  Service: Vascular;  Laterality: Left;  . Endovascular stent insertion N/A 08/26/2013    Procedure:  THORACIC STENT GRAFT INSERTION;  Surgeon: Serafina Mitchell, MD;  Location: [facility] OR;  Service: Vascular;  Laterality: N/A;    History reviewed. No pertinent family history.  Social History:  reports that he quit smoking about 39 years ago. His smoking use included Cigarettes. He smoked 0.00 packs per day. He has never used smokeless tobacco. He reports that he does not drink alcohol or use illicit drugs.  Allergies:  Allergies  Allergen Reactions  . Lortab [Hydrocodone-Acetaminophen] Hives  . Morphine And Related Hives    Medications:  Prior to Admission medications   Medication Sig Start Date End Date Taking? Authorizing Provider  amiodarone (PACERONE) 200 MG tablet Take 1 tablet (200 mg total) by mouth daily. 08/29/13  Yes Perry Mount, PA-C  apixaban (ELIQUIS) 5 MG TABS tablet Take 5 mg by mouth 2 (two) times daily.   Yes Historical Provider, MD  aspirin EC 81 MG tablet Take 81 mg by mouth daily.  Yes Historical Provider, MD  atorvastatin (LIPITOR) 10 MG tablet Take 1 tablet (10 mg total) by mouth daily. 05/20/13  Yes Jettie Booze, MD  cephALEXin (KEFLEX) 500 MG capsule Take 500 mg by mouth 3 (three) times daily. 10/03/13 10/08/13 Yes Historical Provider, MD  enalapril (VASOTEC) 10 MG tablet Take 1 tablet (10 mg total) by mouth daily. 08/29/13  Yes Perry Mount, PA-C  furosemide (LASIX) 40 MG tablet Take 1 tablet (40 mg total) by mouth 2 (two) times daily. 07/18/13  Yes Jettie Booze, MD  hydrALAZINE (APRESOLINE) 25 MG tablet Take 25 mg by mouth 2 (two) times daily.   Yes Historical Provider, MD  lansoprazole (PREVACID) 30 MG capsule Take 30 mg by mouth daily.    Yes Historical Provider, MD  loratadine (CLARITIN) 10 MG tablet Take 10 mg by mouth daily.    Yes Historical Provider, MD  metoprolol (LOPRESSOR) 25 MG tablet Take 1 tablet (25 mg total) by mouth 2 (two) times daily. 08/29/13  Yes Perry Mount, PA-C  Multiple Vitamin (MULTIVITAMIN WITH MINERALS) TABS tablet Take 1 tablet by mouth daily.   Yes Historical Provider, MD  phenazopyridine (PYRIDIUM) 100 MG tablet Take 100 mg by mouth 2 (two) times daily as needed for pain.    Yes Historical Provider, MD  potassium chloride SA (K-DUR,KLOR-CON) 20 MEQ tablet Take 2 tablets (40 mEq total) by mouth daily. 05/13/13  Yes Jettie Booze, MD  tamsulosin (FLOMAX) 0.4 MG CAPS capsule Take 0.4 mg by mouth 2 (two) times daily.    Yes Historical Provider, MD  zolpidem (AMBIEN) 5 MG tablet Take 1 tablet (5 mg total) by mouth at bedtime as needed for sleep. 09/30/13  Yes Jettie Booze, MD  nitroGLYCERIN (NITROSTAT) 0.4 MG SL tablet Place 0.4 mg under the tongue every 5 (five) minutes as needed.      Historical Provider, MD     Results for orders placed during the hospital encounter of 10/05/13 (from the past 48 hour(s))  TROPONIN I     Status: None   Collection Time    10/06/13 10:49 PM      Result Value Ref Range   Troponin I <0.30  <0.30 ng/mL   Comment:            Due to the release kinetics of cTnI,     a negative result within the first hours     of the onset of symptoms does not rule out     myocardial infarction with certainty.     If myocardial infarction is still suspected,     repeat the test at appropriate intervals.  BLOOD GAS, ARTERIAL     Status: Abnormal   Collection Time    10/06/13 11:00 PM      Result Value Ref Range   O2 Content 4.0     Delivery systems NASAL CANNULA     pH, Arterial  7.266 (*) 7.350 - 7.450   pCO2 arterial 44.1  35.0 - 45.0 mmHg   pO2, Arterial 84.9  80.0 - 100.0 mmHg   Bicarbonate 18.7 (*) 20.0 - 24.0 mEq/L   TCO2 19.9  0 - 100 mmol/L   Acid-base deficit 6.6 (*) 0.0 - 2.0 mmol/L   O2 Saturation 90.8     Patient temperature 102.6     Collection site RIGHT RADIAL     Drawn by [id number]     Sample type ARTERIAL DRAW     Allens test (pass/fail) PASS  PASS  LACTIC ACID, PLASMA     Status: Abnormal   Collection Time    10/06/13 11:15 PM      Result Value Ref Range   Lactic Acid, Venous 3.7 (*) 0.5 - 2.2 mmol/L  BASIC METABOLIC PANEL     Status: Abnormal   Collection Time    10/06/13 11:15 PM      Result Value Ref Range   Sodium 138  137 - 147 mEq/L   Potassium 4.2  3.7 - 5.3 mEq/L   Comment: DELTA CHECK NOTED   Chloride 102  96 - 112 mEq/L   CO2 19  19 - 32 mEq/L   Glucose, Bld 125 (*) 70 - 99 mg/dL   BUN 21  6 - 23 mg/dL   Creatinine, Ser 0.73  0.50 - 1.35 mg/dL   Calcium 8.5  8.4 - 10.5 mg/dL   GFR calc non Af Amer 89 (*) >90 mL/min   GFR calc Af Amer >90  >90 mL/min   Comment: (NOTE)     The eGFR has been calculated using the CKD EPI equation.     This calculation has not been validated in all clinical situations.     eGFR's persistently <90 mL/min signify possible Chronic Kidney     Disease.  D-DIMER, QUANTITATIVE     Status: Abnormal   Collection Time    10/06/13 11:15 PM      Result Value Ref Range   D-Dimer, Quant 18.73 (*) 0.00 - 0.48 ug/mL-FEU   Comment:            AT THE INHOUSE ESTABLISHED CUTOFF     VALUE OF 0.48 ug/mL FEU,     THIS ASSAY HAS BEEN DOCUMENTED     IN THE LITERATURE TO HAVE     A SENSITIVITY AND NEGATIVE     PREDICTIVE VALUE OF AT LEAST     98 TO 99%.  THE TEST RESULT     SHOULD BE CORRELATED WITH     AN ASSESSMENT OF THE CLINICAL     PROBABILITY OF DVT / VTE.     REPEATED TO VERIFY     SPECIMEN CHECKED FOR CLOTS  GLUCOSE, CAPILLARY     Status: Abnormal   Collection Time    10/06/13 11:27 PM      Result  Value Ref Range   Glucose-Capillary 128 (*) 70 - 99 mg/dL  PROCALCITONIN     Status: None   Collection Time    10/06/13 11:57 PM      Result Value Ref Range   Procalcitonin 0.21     Comment:            Interpretation:     PCT (Procalcitonin) <= 0.5 ng/mL:     Systemic infection (sepsis) is not likely.     Local bacterial infection is possible.     (NOTE)             ICU PCT Algorithm               Non ICU PCT Algorithm        ----------------------------     ------------------------------             PCT < 0.25 ng/mL                 PCT < 0.1 ng/mL         Stopping of antibiotics            Stopping of antibiotics  strongly encouraged.               strongly encouraged.        ----------------------------     ------------------------------           PCT level decrease by               PCT < 0.25 ng/mL           >= 80% from peak PCT           OR PCT 0.25 - 0.5 ng/mL          Stopping of antibiotics                                                 encouraged.         Stopping of antibiotics               encouraged.        ----------------------------     ------------------------------           PCT level decrease by              PCT >= 0.25 ng/mL           < 80% from peak PCT            AND PCT >= 0.5 ng/mL            Continuing antibiotics                                                  encouraged.           Continuing antibiotics                encouraged.        ----------------------------     ------------------------------         PCT level increase compared          PCT > 0.5 ng/mL             with peak PCT AND              PCT >= 0.5 ng/mL             Escalation of antibiotics                                              strongly encouraged.          Escalation of antibiotics            strongly encouraged.  PROCALCITONIN     Status: None   Collection Time    10/07/13 12:30 AM      Result Value Ref Range   Procalcitonin 1.39     Comment:             Interpretation:     PCT > 0.5 ng/mL and <= 2 ng/mL:     Systemic infection (sepsis) is possible,     but other conditions are known to elevate     PCT as well.     (NOTE)  ICU PCT Algorithm               Non ICU PCT Algorithm        ----------------------------     ------------------------------             PCT < 0.25 ng/mL                 PCT < 0.1 ng/mL         Stopping of antibiotics            Stopping of antibiotics           strongly encouraged.               strongly encouraged.        ----------------------------     ------------------------------           PCT level decrease by               PCT < 0.25 ng/mL           >= 80% from peak PCT           OR PCT 0.25 - 0.5 ng/mL          Stopping of antibiotics                                                 encouraged.         Stopping of antibiotics               encouraged.        ----------------------------     ------------------------------           PCT level decrease by              PCT >= 0.25 ng/mL           < 80% from peak PCT            AND PCT >= 0.5 ng/mL            Continuing antibiotics                                                  encouraged.           Continuing antibiotics                encouraged.        ----------------------------     ------------------------------         PCT level increase compared          PCT > 0.5 ng/mL             with peak PCT AND              PCT >= 0.5 ng/mL             Escalation of antibiotics                                              strongly encouraged.          Escalation of antibiotics  strongly encouraged.  PRO B NATRIURETIC PEPTIDE     Status: Abnormal   Collection Time    10/07/13 12:30 AM      Result Value Ref Range   Pro B Natriuretic peptide (BNP) 3141.0 (*) 0 - 125 pg/mL  BLOOD GAS, ARTERIAL     Status: Abnormal   Collection Time    10/07/13  2:00 AM      Result Value Ref Range   FIO2 1.00     Delivery systems VENTILATOR     Mode  PRESSURE REGULATED VOLUME CONTROL     VT 500     Rate 16     Peep/cpap 5.0     pH, Arterial 7.299 (*) 7.350 - 7.450   pCO2 arterial 51.0 (*) 35.0 - 45.0 mmHg   pO2, Arterial 217.0 (*) 80.0 - 100.0 mmHg   Bicarbonate 23.5  20.0 - 24.0 mEq/L   TCO2 24.9  0 - 100 mmol/L   Acid-base deficit 1.6  0.0 - 2.0 mmol/L   O2 Saturation 98.2     Patient temperature 102.8     Collection site LEFT RADIAL     Drawn by [phone]     Sample type ARTERIAL DRAW     Allens test (pass/fail) PASS  PASS  MAGNESIUM     Status: None   Collection Time    10/07/13  2:30 AM      Result Value Ref Range   Magnesium 1.8  1.5 - 2.5 mg/dL  PHOSPHORUS     Status: Abnormal   Collection Time    10/07/13  2:30 AM      Result Value Ref Range   Phosphorus 1.6 (*) 2.3 - 4.6 mg/dL  TROPONIN I     Status: None   Collection Time    10/07/13  3:20 AM      Result Value Ref Range   Troponin I <0.30  <0.30 ng/mL   Comment:            Due to the release kinetics of cTnI,     a negative result within the first hours     of the onset of symptoms does not rule out     myocardial infarction with certainty.     If myocardial infarction is still suspected,     repeat the test at appropriate intervals.  CBC     Status: Abnormal   Collection Time    10/07/13  3:20 AM      Result Value Ref Range   WBC 8.6  4.0 - 10.5 K/uL   RBC 3.36 (*) 4.22 - 5.81 MIL/uL   Hemoglobin 9.1 (*) 13.0 - 17.0 g/dL   Comment: DELTA CHECK NOTED     RESULTS VERIFIED VIA RECOLLECT   HCT 31.5 (*) 39.0 - 52.0 %   MCV 93.8  78.0 - 100.0 fL   MCH 27.1  26.0 - 34.0 pg   MCHC 28.9 (*) 30.0 - 36.0 g/dL   RDW 17.9 (*) 11.5 - 15.5 %   Platelets 241  150 - 400 K/uL  TRIGLYCERIDES     Status: None   Collection Time    10/07/13  3:20 AM      Result Value Ref Range   Triglycerides 116  <150 mg/dL  BASIC METABOLIC PANEL     Status: Abnormal   Collection Time    10/07/13  3:20 AM      Result Value Ref Range   Sodium 142  137 - 147 mEq/L  Potassium 3.3  (*) 3.7 - 5.3 mEq/L   Comment: DELTA CHECK NOTED   Chloride 105  96 - 112 mEq/L   CO2 22  19 - 32 mEq/L   Glucose, Bld 153 (*) 70 - 99 mg/dL   BUN 21  6 - 23 mg/dL   Creatinine, Ser 0.85  0.50 - 1.35 mg/dL   Calcium 8.4  8.4 - 10.5 mg/dL   GFR calc non Af Amer 84 (*) >90 mL/min   GFR calc Af Amer >90  >90 mL/min   Comment: (NOTE)     The eGFR has been calculated using the CKD EPI equation.     This calculation has not been validated in all clinical situations.     eGFR's persistently <90 mL/min signify possible Chronic Kidney     Disease.  BLOOD GAS, ARTERIAL     Status: Abnormal   Collection Time    10/07/13  5:00 AM      Result Value Ref Range   FIO2 0.60     Delivery systems VENTILATOR     Mode PRESSURE REGULATED VOLUME CONTROL     VT 580     Rate 16     Peep/cpap 5.0     pH, Arterial 7.371  7.350 - 7.450   pCO2 arterial 39.1  35.0 - 45.0 mmHg   pO2, Arterial 105.0 (*) 80.0 - 100.0 mmHg   Bicarbonate 22.0  20.0 - 24.0 mEq/L   TCO2 23.2  0 - 100 mmol/L   Acid-base deficit 2.4 (*) 0.0 - 2.0 mmol/L   O2 Saturation 96.3     Patient temperature 99.0     Collection site RIGHT RADIAL     Drawn by [phone]     Sample type ARTERIAL DRAW     Allens test (pass/fail) PASS  PASS  HEPARIN LEVEL (UNFRACTIONATED)     Status: Abnormal   Collection Time    10/07/13  5:40 AM      Result Value Ref Range   Heparin Unfractionated 1.52 (*) 0.30 - 0.70 IU/mL   Comment: RESULTS CONFIRMED BY MANUAL DILUTION                IF HEPARIN RESULTS ARE BELOW     EXPECTED VALUES, AND PATIENT     DOSAGE HAS BEEN CONFIRMED,     SUGGEST FOLLOW UP TESTING     OF ANTITHROMBIN III LEVELS.  APTT     Status: None   Collection Time    10/07/13  5:40 AM      Result Value Ref Range   aPTT 32  24 - 37 seconds  TROPONIN I     Status: None   Collection Time    10/07/13 10:49 AM      Result Value Ref Range   Troponin I <0.30  <0.30 ng/mL   Comment:            Due to the release kinetics of cTnI,     a  negative result within the first hours     of the onset of symptoms does not rule out     myocardial infarction with certainty.     If myocardial infarction is still suspected,     repeat the test at appropriate intervals.  APTT     Status: Abnormal   Collection Time    10/07/13  1:00 PM      Result Value Ref Range   aPTT 58 (*) 24 - 37 seconds   Comment:  IF BASELINE aPTT IS ELEVATED,     SUGGEST PATIENT RISK ASSESSMENT     BE USED TO DETERMINE APPROPRIATE     ANTICOAGULANT THERAPY.  LACTIC ACID, PLASMA     Status: None   Collection Time    10/07/13  2:30 PM      Result Value Ref Range   Lactic Acid, Venous 1.3  0.5 - 2.2 mmol/L  CORTISOL     Status: None   Collection Time    10/07/13  3:00 PM      Result Value Ref Range   Cortisol, Plasma 10.3     Comment: (NOTE)     AM:  4.3 - 22.4 ug/dL     PM:  3.1 - 16.7 ug/dL     Performed at [facility]     Status: Abnormal   Collection Time    10/07/13  4:25 PM      Result Value Ref Range   Sodium 139  137 - 147 mEq/L   Potassium 4.2  3.7 - 5.3 mEq/L   Chloride 106  96 - 112 mEq/L   CO2 20  19 - 32 mEq/L   Glucose, Bld 118 (*) 70 - 99 mg/dL   BUN 20  6 - 23 mg/dL   Creatinine, Ser 0.72  0.50 - 1.35 mg/dL   Calcium 7.4 (*) 8.4 - 10.5 mg/dL   GFR calc non Af Amer 90 (*) >90 mL/min   GFR calc Af Amer >90  >90 mL/min   Comment: (NOTE)     The eGFR has been calculated using the CKD EPI equation.     This calculation has not been validated in all clinical situations.     eGFR's persistently <90 mL/min signify possible Chronic Kidney     Disease.  MAGNESIUM     Status: None   Collection Time    10/07/13  4:25 PM      Result Value Ref Range   Magnesium 2.4  1.5 - 2.5 mg/dL  GLUCOSE, CAPILLARY     Status: Abnormal   Collection Time    10/07/13  5:49 PM      Result Value Ref Range   Glucose-Capillary 111 (*) 70 - 99 mg/dL  GLUCOSE, CAPILLARY     Status: Abnormal   Collection Time     10/07/13 10:10 PM      Result Value Ref Range   Glucose-Capillary 135 (*) 70 - 99 mg/dL  APTT     Status: Abnormal   Collection Time    10/07/13 11:30 PM      Result Value Ref Range   aPTT 75 (*) 24 - 37 seconds   Comment:            IF BASELINE aPTT IS ELEVATED,     SUGGEST PATIENT RISK ASSESSMENT     BE USED TO DETERMINE APPROPRIATE     ANTICOAGULANT THERAPY.  GLUCOSE, CAPILLARY     Status: Abnormal   Collection Time    10/08/13 12:10 AM      Result Value Ref Range   Glucose-Capillary 112 (*) 70 - 99 mg/dL   Comment 1 Notify RN    PROCALCITONIN     Status: None   Collection Time    10/08/13  4:15 AM      Result Value Ref Range   Procalcitonin 21.26     Comment:            Interpretation:  PCT >= 10 ng/mL:     Important systemic inflammatory response,     almost exclusively due to severe bacterial     sepsis or septic shock.     (NOTE)             ICU PCT Algorithm               Non ICU PCT Algorithm        ----------------------------     ------------------------------             PCT < 0.25 ng/mL                 PCT < 0.1 ng/mL         Stopping of antibiotics            Stopping of antibiotics           strongly encouraged.               strongly encouraged.        ----------------------------     ------------------------------           PCT level decrease by               PCT < 0.25 ng/mL           >= 80% from peak PCT           OR PCT 0.25 - 0.5 ng/mL          Stopping of antibiotics                                                 encouraged.         Stopping of antibiotics               encouraged.        ----------------------------     ------------------------------           PCT level decrease by              PCT >= 0.25 ng/mL           < 80% from peak PCT            AND PCT >= 0.5 ng/mL            Continuing antibiotics                                                  encouraged.           Continuing antibiotics                encouraged.         ----------------------------     ------------------------------         PCT level increase compared          PCT > 0.5 ng/mL             with peak PCT AND              PCT >= 0.5 ng/mL             Escalation of antibiotics  strongly encouraged.          Escalation of antibiotics            strongly encouraged.  MAGNESIUM     Status: Abnormal   Collection Time    10/08/13  4:15 AM      Result Value Ref Range   Magnesium 2.7 (*) 1.5 - 2.5 mg/dL  PHOSPHORUS     Status: Abnormal   Collection Time    10/08/13  4:15 AM      Result Value Ref Range   Phosphorus 2.1 (*) 2.3 - 4.6 mg/dL  HEPARIN LEVEL (UNFRACTIONATED)     Status: None   Collection Time    10/08/13  4:15 AM      Result Value Ref Range   Heparin Unfractionated 0.70  0.30 - 0.70 IU/mL   Comment:            IF HEPARIN RESULTS ARE BELOW     EXPECTED VALUES, AND PATIENT     DOSAGE HAS BEEN CONFIRMED,     SUGGEST FOLLOW UP TESTING     OF ANTITHROMBIN III LEVELS.  APTT     Status: Abnormal   Collection Time    10/08/13  4:15 AM      Result Value Ref Range   aPTT 62 (*) 24 - 37 seconds   Comment:            IF BASELINE aPTT IS ELEVATED,     SUGGEST PATIENT RISK ASSESSMENT     BE USED TO DETERMINE APPROPRIATE     ANTICOAGULANT THERAPY.  CBC     Status: Abnormal   Collection Time    10/08/13  4:15 AM      Result Value Ref Range   WBC 4.9  4.0 - 10.5 K/uL   RBC 2.86 (*) 4.22 - 5.81 MIL/uL   Hemoglobin 7.8 (*) 13.0 - 17.0 g/dL   HCT 25.8 (*) 39.0 - 52.0 %   MCV 90.2  78.0 - 100.0 fL   MCH 27.3  26.0 - 34.0 pg   MCHC 30.2  30.0 - 36.0 g/dL   RDW 18.2 (*) 11.5 - 15.5 %   Platelets 208  150 - 400 K/uL  LACTIC ACID, PLASMA     Status: None   Collection Time    10/08/13  4:15 AM      Result Value Ref Range   Lactic Acid, Venous 1.0  0.5 - 2.2 mmol/L  BASIC METABOLIC PANEL     Status: Abnormal   Collection Time    10/08/13  4:15 AM      Result Value Ref Range   Sodium 140   137 - 147 mEq/L   Potassium 3.3 (*) 3.7 - 5.3 mEq/L   Chloride 109  96 - 112 mEq/L   CO2 19  19 - 32 mEq/L   Glucose, Bld 108 (*) 70 - 99 mg/dL   BUN 19  6 - 23 mg/dL   Creatinine, Ser 0.62  0.50 - 1.35 mg/dL   Calcium 7.1 (*) 8.4 - 10.5 mg/dL   GFR calc non Af Amer >90  >90 mL/min   GFR calc Af Amer >90  >90 mL/min   Comment: (NOTE)     The eGFR has been calculated using the CKD EPI equation.     This calculation has not been validated in all clinical situations.     eGFR's persistently <90 mL/min signify possible Chronic Kidney     Disease.  HEPATIC FUNCTION PANEL  Status: Abnormal   Collection Time    10/08/13  4:15 AM      Result Value Ref Range   Total Protein 5.1 (*) 6.0 - 8.3 g/dL   Albumin 2.1 (*) 3.5 - 5.2 g/dL   AST 95 (*) 0 - 37 U/L   ALT 87 (*) 0 - 53 U/L   Alkaline Phosphatase 87  39 - 117 U/L   Total Bilirubin 0.5  0.3 - 1.2 mg/dL   Bilirubin, Direct 0.2  0.0 - 0.3 mg/dL   Indirect Bilirubin 0.3  0.3 - 0.9 mg/dL  GLUCOSE, CAPILLARY     Status: Abnormal   Collection Time    10/08/13  4:15 AM      Result Value Ref Range   Glucose-Capillary 106 (*) 70 - 99 mg/dL   Comment 1 Notify RN    GLUCOSE, CAPILLARY     Status: Abnormal   Collection Time    10/08/13  7:43 AM      Result Value Ref Range   Glucose-Capillary 122 (*) 70 - 99 mg/dL  VANCOMYCIN, TROUGH     Status: None   Collection Time    10/08/13  8:10 AM      Result Value Ref Range   Vancomycin Tr 16.5  10.0 - 20.0 ug/mL    Dg Chest Port 1 View  10/08/2013   CLINICAL DATA:  ET tube check  EXAM: PORTABLE CHEST - 1 VIEW  COMPARISON:  DG CHEST 1V PORT dated 10/07/2013; CT ANGIO CHEST W/CM &/OR WO/CM dated 08/22/2013; DG CHEST 1V PORT dated 05/04/2013; DG CHEST 1V PORT dated 05/12/2013; DG CHEST 2 VIEW dated 05/09/2013  FINDINGS: There is an endotracheal tube with the tip 7.5 cm above the carina. There is a nasogastric tube coursing below the diaphragm. There is a right jugular central venous catheter with the  tip projecting over the SVC.  There is bilateral diffuse interstitial thickening. There is a small left pleural effusion and left basilar airspace disease likely representing atelectasis. There is hazy airspace disease adjacent to the aortic arch in the left lung which may reflect atelectasis versus false lumen from the aortic dissection. . There is no pneumothorax. There is an aortic stent graft noted. Prior median sternotomy. Prosthetic aortic valve is noted.  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  IMPRESSION: 1. Endotracheal tube 7.5 cm above the carina. 2. Stable pulmonary edema.  Stable cardiomegaly.   Electronically Signed   By: Kathreen Devoid   On: 10/08/2013 07:45   Dg Chest Port 1 View  10/07/2013   CLINICAL DATA:  Intubated  EXAM: PORTABLE CHEST - 1 VIEW  COMPARISON:  DG CHEST 1V PORT dated 10/06/2013  FINDINGS: Endotracheal tube placed. Tip is at the carina. NG tube placed. Tip is in the fundus. Central hazy airspace disease has developed worrisome for developing edema. Left pleural effusion has increased. Cardiomegaly. Postoperative changes are stable. Right internal jugular central venous catheter placed with its tip in the upper SVC. No pneumothorax.  IMPRESSION: Endotracheal tube placed.  Tip is at the carina.  NG tube placed  Right internal jugular central venous catheter placed with its tip at the upper SVC and no pneumothorax  Developing airspace disease worrisome for edema or ARDS.   Electronically Signed   By: Maryclare Bean M.D.   On: 10/07/2013 01:59   Dg Chest Port 1 View  10/06/2013   CLINICAL DATA:  Cough and respiratory distress.  EXAM: PORTABLE CHEST - 1 VIEW  COMPARISON:  CT ANGIO CHEST AORTA W/CM & WO/CM dated 10/06/2013; DG CHEST 2 VIEW dated 10/06/2013  FINDINGS: No significant change since previous study. Previous postoperative changes in the mediastinum with aortic stent graft placement. Mild cardiac enlargement. Mild pulmonary vascular congestion. Small left pleural  effusion with basilar atelectasis.  IMPRESSION: No significant change since prior study.   Electronically Signed   By: Lucienne Capers M.D.   On: 10/06/2013 23:10   Ct Angio Chest Aortic Dissect W &/or W/o  10/06/2013   CLINICAL DATA:  Recent drop in hemoglobin, history of thoracic aortic stent graft  EXAM: CT ANGIOGRAPHY CHEST WITH CONTRAST  TECHNIQUE: Multidetector CT imaging of the chest was performed using the standard protocol during bolus administration of intravenous contrast. Multiplanar CT image reconstructions and MIPs were obtained to evaluate the vascular anatomy.  CONTRAST:  50m OMNIPAQUE IOHEXOL 350 MG/ML SOLN  COMPARISON:  08/22/2013  FINDINGS: A left-sided pleural effusion is seen which is increased from the prior exam. Left lower lobe consolidation secondary to the effusion is seen. No significant right-sided infiltrate is noted. 7 mm nodule is again seen in the anterior aspect of the right upper lobe best seen on image number 43. This is stable from the prior exam.  There are changes consistent with recent aortic stent graft placement. The stent is widely patent and no findings to suggest endoleak are identified. The previously seen aortic dissection has been excluded. The thrombus surrounding the stent graft in the descending thoracic aorta is slightly greater in density than the adjacent fluid although felt to be within normal limits given the relative recent surgical history. There are no findings to suggest active extravasation. The more distal thoracic aorta and proximal visualized abdominal aorta are within normal limits. Few small mediastinal lymph nodes are seen which are stable from the prior exam. 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 Some retrograde flow in the proximal portion of the subclavian artery is noted secondary to a carotid subclavian bypass graft which appears patent.  No evidence  of pulmonary emboli is identified. The visualized upper abdomen this similar to that seen on recent CT of the abdomen and pelvis.  Review of the MIP images confirms the above findings.  IMPRESSION: Changes of thoracic aortic stent graft with exclusion of the previously seen aortic dissection. No evidence to suggest endoleak is identified.  Stable right upper lobe pulmonary nodule.  Left lower lobe consolidation with associated effusion.  Patent left common carotid artery to left subclavian artery bypass with some retrograde filling of the more proximal left subclavian artery. 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   Electronically Signed   By: MInez CatalinaM.D.   On: 10/06/2013 16:58    Review of Systems  Respiratory: Negative for shortness of breath.   Cardiovascular: Negative for chest pain and palpitations.  Neurological: Negative for dizziness and loss of consciousness.  All other systems reviewed and are negative.  Blood pressure 118/50, pulse 91, temperature 97.7 F (36.5 C), temperature source Oral, resp. rate 26, height 5' 10" (1.778 m), weight 182 lb 8.7 oz (82.8 kg), SpO2 98.00%. Physical Exam  Constitutional: He is oriented to person, place, and time. He appears well-developed and well-nourished.  Cardiovascular: Normal rate and regular rhythm.   Crisp mechanical heart valve sounds   Respiratory: Breath sounds normal.  GI: Bowel sounds are normal.  Musculoskeletal: He exhibits no edema.  Neurological: He is alert and oriented to person, place, and time.  Skin: Skin is dry.  Psychiatric: He has a normal mood and affect. His behavior is normal.    Assessment/Plan: Principal Problem:   UTI (urinary tract infection) Active Problems:   HTN (hypertension)   Aortic dissection   Anemia   CAD (coronary artery disease)   S/P AVR (aortic valve replacement)   Chronic systolic congestive heart failure   Atrial fibrillation   Recurrent  UTI   Urinary retention   Acute respiratory failure with hypoxia   Shock circulatory  Plan:  1. VT- resulted from Qt prolongation, in the setting of metabolic derangement. EKG today shows improved Qt at 438. Telemetry shows no further recurrence. Am labs show hypokalemia with K of 3.3. Continue to replete K. Mg today is 2.7. Keep K >4 and Mg > 2. Avoid cipro. Continue on telemetry for further monitoring. We will continue to follow.   2. Atrial Fibrillation- continue Amiodarone, Lopressor and Eliquis  Brittainy Simmons 10/08/2013, 1:06 PM

## 2013-10-08 NOTE — Progress Notes (Signed)
ANTICOAGULATION and ANTIBIOTIC CONSULT NOTE  Pharmacy Consult for Apixaban  Indication: atrial fibrillation  Pharmacy Consult for Vancomycin and Zosyn Indication: Possible HCAP/UTI  Allergies  Allergen Reactions  . Lortab [Hydrocodone-Acetaminophen] Hives  . Morphine And Related Hives    Patient Measurements: Height: 5\' 10"  (177.8 cm) Weight: 182 lb 8.7 oz (82.8 kg) IBW/kg (Calculated) : 73 Vital Signs: Temp: 97.7 F (36.5 C) (04/22 0806) Temp src: Oral (04/22 0806) BP: 117/48 mmHg (04/22 1100) Pulse Rate: 92 (04/22 1100)  Labs:  Recent Labs  10/06/13 0546 10/06/13 2249  10/07/13 0320  10/07/13 0540 10/07/13 1049 10/07/13 1300 10/07/13 1625 10/07/13 2330 10/08/13 0415  HGB 7.4*  --   --  9.1*  --   --   --   --   --   --  7.8*  HCT 24.7*  --   --  31.5*  --   --   --   --   --   --  25.8*  PLT 198  --   --  241  --   --   --   --   --   --  208  APTT  --   --   --   --   < > 32  --  58*  --  75* 62*  LABPROT 19.8*  --   --   --   --   --   --   --   --   --   --   INR 1.74*  --   --   --   --   --   --   --   --   --   --   HEPARINUNFRC  --   --   --   --   --  1.52*  --   --   --   --  0.70  CREATININE 0.82  --   < > 0.85  --   --   --   --  0.72  --  0.62  TROPONINI  --  <0.30  --  <0.30  --   --  <0.30  --   --   --   --   < > = values in this interval not displayed.  Estimated Creatinine Clearance: 83.6 ml/min (by C-G formula based on Cr of 0.62).  Medical History: Past Medical History  Diagnosis Date  . Hyperlipidemia   . DVT (deep venous thrombosis)     post knee surgery  . Tobacco abuse   . HTN (hypertension)   . Descending aortic aneurysm   . Aortic valve insufficiency   . Thoracic aneurysm   . S/P AVR (aortic valve replacement)   . CAD (coronary artery disease)   . Atrial flutter   . CHF (congestive heart failure)     Medications:  Apixaban PTA  Assessment: 73 y/o M here with fever/UTI (failed outpatient Keflex treatment) on apixaban  PTA for afib. Noted Hgb of 7.8 which is stable since admission, will follow closely.   Anticoagulation: Initially apixaban on hold for low hgb, started on hep gtt on 4/21 for ACS symptoms, now to transition back to apixaban. Of note, LFTs mildly elevated this am so will follow for further increases since apixaban is contraindicated in severe hepatic impairment. SCr stable at 0.62 (CrCl ~83 ml/min), wt >60kg, age<80 so apixaban dose appropriate.   Antibiotics: Day #3 of empiric abx for possible HCAP/inadequately treated UTI. Abnormal U/A on admission.  Remains afebrile, WBC  wnl, to be extubated today. VT this am therapeutic at 16.5.  Cipro 4/20 >> 4/20 Cefepime 4/20 >>4/21 Vanc 4/20 >> Zosyn 4/21>>  4/20 UCx - NEG 4/20 BCx x2 - NGTD  Goal of Therapy:  Vancomycin Trough: 15-20 mcg/ml Monitor platelets by anticoagulation protocol: Yes   Plan:  - D/c heparin gtt and restart Apixaban 5 mg PO BID at time of heparin discontinuation - Minimum q72h CBC  - Monitor for bleeding, trend Hgb  - Continue Vancomycin 1250 mg IV q12h - Continue Zosyn 3.375 gm IV q8h - 4 hr infusion - Trend WBC, temp, C&S, VT prn, renal function    Margie Billet, PharmD Clinical Pharmacist - Resident Pager: [phone] Pharmacy: [phone] 10/08/2013 12:01 PM

## 2013-10-08 NOTE — Progress Notes (Signed)
ANTICOAGULATION CONSULT NOTE Pharmacy Consult for Heparin Indication: atrial fibrillation  Allergies  Allergen Reactions  . Lortab [Hydrocodone-Acetaminophen] Hives  . Morphine And Related Hives    Patient Measurements: Height: 5\' 10"  (177.8 cm) Weight: 178 lb 2.1 oz (80.8 kg) IBW/kg (Calculated) : 73  Vital Signs: Temp: 98.6 F (37 C) (04/22 0000) Temp src: Oral (04/22 0000) BP: 134/50 mmHg (04/22 0000) Pulse Rate: 59 (04/22 0000)  Labs:  Recent Labs  10/06/13 0001 10/06/13 0546 10/06/13 2249 10/06/13 2315 10/07/13 0320 10/07/13 0540 10/07/13 1049 10/07/13 1300 10/07/13 1625 10/07/13 2330  HGB 8.3* 7.4*  --   --  9.1*  --   --   --   --   --   HCT 27.6* 24.7*  --   --  31.5*  --   --   --   --   --   PLT 232 198  --   --  241  --   --   --   --   --   APTT  --   --   --   --   --  32  --  58*  --  75*  LABPROT  --  19.8*  --   --   --   --   --   --   --   --   INR  --  1.74*  --   --   --   --   --   --   --   --   HEPARINUNFRC  --   --   --   --   --  1.52*  --   --   --   --   CREATININE 0.83 0.82  --  0.73 0.85  --   --   --  0.72  --   TROPONINI  --   --  <0.30  --  <0.30  --  <0.30  --   --   --     Estimated Creatinine Clearance: 83.6 ml/min (by C-G formula based on Cr of 0.72).  Assessment: 73 yo male with chest pain/Afib for heparin.  Pt was on Eliquis PTA, last dose 4/19.    Goal of Therapy:  APTT 66-102 Heparin level 0.3-0.7 units/ml Monitor platelets by anticoagulation protocol: Yes   Plan:  Continue Heparin at current rate   Follow-up am labs.  Gary Fleet Noretta Frier 10/08/2013,12:25 AM

## 2013-10-08 NOTE — Procedures (Signed)
Extubation Procedure Note  Patient Details:   Name: [name] [name] DOB: [birth year] MRN: [id number]   Pt extubated to 4L [place]. Pt able to vocalize, no stridor noted, VS WNL. Pt tolerated well, RT will continue to monitor.    Evaluation  O2 sats: stable throughout Complications: No apparent complications Patient did tolerate procedure well. Bilateral Breath Sounds: Diminished Suctioning: Airway Yes  Harley Hallmark 10/08/2013, 12:26 PM

## 2013-10-08 NOTE — Progress Notes (Signed)
Wasted 75 cc of fentanyl in sink with Drucie Opitz, RN.

## 2013-10-09 ENCOUNTER — Inpatient Hospital Stay (HOSPITAL_COMMUNITY): Payer: Medicare Other

## 2013-10-09 LAB — GLUCOSE, CAPILLARY: Glucose-Capillary: 91 mg/dL (ref 70–99)

## 2013-10-09 LAB — BASIC METABOLIC PANEL
BUN: 14 mg/dL (ref 6–23)
CALCIUM: 7.8 mg/dL — AB (ref 8.4–10.5)
CO2: 22 mEq/L (ref 19–32)
Chloride: 109 mEq/L (ref 96–112)
Creatinine, Ser: 0.57 mg/dL (ref 0.50–1.35)
GLUCOSE: 84 mg/dL (ref 70–99)
Potassium: 3.9 mEq/L (ref 3.7–5.3)
SODIUM: 141 meq/L (ref 137–147)

## 2013-10-09 LAB — CBC
HCT: 24.8 % — ABNORMAL LOW (ref 39.0–52.0)
Hemoglobin: 7.5 g/dL — ABNORMAL LOW (ref 13.0–17.0)
MCH: 27.9 pg (ref 26.0–34.0)
MCHC: 30.2 g/dL (ref 30.0–36.0)
MCV: 92.2 fL (ref 78.0–100.0)
Platelets: 192 10*3/uL (ref 150–400)
RBC: 2.69 MIL/uL — ABNORMAL LOW (ref 4.22–5.81)
RDW: 18.1 % — AB (ref 11.5–15.5)
WBC: 3.9 10*3/uL — ABNORMAL LOW (ref 4.0–10.5)

## 2013-10-09 LAB — PROCALCITONIN: PROCALCITONIN: 10.34 ng/mL

## 2013-10-09 LAB — PHOSPHORUS: Phosphorus: 3 mg/dL (ref 2.3–4.6)

## 2013-10-09 LAB — MAGNESIUM: Magnesium: 2.4 mg/dL (ref 1.5–2.5)

## 2013-10-09 MED ORDER — SODIUM CHLORIDE 0.9 % IJ SOLN
10.0000 mL | INTRAMUSCULAR | Status: DC | PRN
  Administered 2013-10-11: 10 mL

## 2013-10-09 MED ORDER — METOPROLOL TARTRATE 25 MG PO TABS
25.0000 mg | ORAL_TABLET | Freq: Two times a day (BID) | ORAL | Status: DC
  Administered 2013-10-09 – 2013-10-10 (×3): 25 mg via ORAL
  Filled 2013-10-09 (×5): qty 1

## 2013-10-09 MED ORDER — POTASSIUM CHLORIDE CRYS ER 20 MEQ PO TBCR
40.0000 meq | EXTENDED_RELEASE_TABLET | Freq: Every day | ORAL | Status: DC
  Administered 2013-10-09 – 2013-10-11 (×3): 40 meq via ORAL
  Filled 2013-10-09 (×3): qty 2

## 2013-10-09 MED ORDER — BOOST / RESOURCE BREEZE PO LIQD
1.0000 | Freq: Three times a day (TID) | ORAL | Status: DC
  Administered 2013-10-09 – 2013-10-10 (×4): 1 via ORAL

## 2013-10-09 MED ORDER — PANTOPRAZOLE SODIUM 40 MG PO TBEC
40.0000 mg | DELAYED_RELEASE_TABLET | Freq: Every day | ORAL | Status: DC
  Administered 2013-10-09 – 2013-10-11 (×2): 40 mg via ORAL
  Filled 2013-10-09 (×3): qty 1

## 2013-10-09 NOTE — Progress Notes (Signed)
PULMONARY / CRITICAL CARE MEDICINE   Name: [name] [name] MRN: [id number] DOB: 06-12-1941    ADMISSION DATE:  10/05/2013 CONSULTATION DATE: 10/06/13  REFERRING MD : [facility] PRIMARY SERVICE: PCCM  CHIEF COMPLAINT:  Dyspnea  BRIEF PATIENT DESCRIPTION: 73 year old male with recent history of thoracic aortic aneurism s/p mesh stent repair, aortic valve replacement, and chf admitted for sepsis 4/19 developed CP and profound dyspnea after ambulating to restroom 4/20. He was transferred to ICU and PCCM to assume care.    SIGNIFICANT EVENTS / STUDIES:   4/19 Admitted for VDRF with shock requiring pressor support 4/20 CTA:  thoracic aortic stent graft with exclusion of the previously seen aortic dissection. No evidence of endoleak.   Stable right upper lobe pulmonary nodule. Left lower lobe consolidation with associated effusion. Patent left common carotid artery to left subclavian artery bypass with some retrograde filling of the more proximal left subclavian artery. 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 4/22 Extubated successfully  4/23 CXR with moderate size left-sided effusion with associated worsening left mid and lower lung consolidative opacities   LINES / TUBES: ETT 10/07/13>>4/22 Central line 4/21>>4/22  CULTURES: Blood 4/19 >>> Urine 4/20 >>>NG  ANTIBIOTICS: Cefepime 4/19 > 4/20 Vanc 4/19 >>> Zosyn 4/20 >>>  SUBJECTIVE:  No acute events overnight. Currently breathing comfortably on 2L [place] O2.  Afebrile with dry cough present. No urinary symptoms.    VITAL SIGNS: Temp:  [97.7 F (36.5 C)-99 F (37.2 C)] 98.4 F (36.9 C) (04/23 0344) Pulse Rate:  [66-99] 70 (04/23 0600) Resp:  [14-29] 19 (04/23 0600) BP: (117-155)/(41-71) 126/44 mmHg (04/23 0600) SpO2:  [96 %-100 %] 96 % (04/23 0600) FiO2 (%):  [40 %] 40 % (04/22 1200) Weight:  [86.2 kg (190 lb 0.6 oz)] 86.2 kg (190 lb 0.6 oz) (04/23 0500) HEMODYNAMICS: CVP:  [5 mmHg-6 mmHg] 5  mmHg VENTILATOR SETTINGS: Vent Mode:  [-] PSV FiO2 (%):  [40 %] 40 % PEEP:  [5 cmH20] 5 cmH20 Pressure Support:  [8 cmH20] 8 cmH20 INTAKE / OUTPUT: Intake/Output     04/22 0701 - 04/23 0700   I.V. (mL/kg) 1393.8 (16.2)   NG/GT 240   IV Piggyback 986.6   Total Intake(mL/kg) 2620.4 (30.4)   Urine (mL/kg/hr) 1000 (0.5)   Total Output 1000   Net +1620.4         PHYSICAL EXAMINATION: General:  NAD Neuro: No focal deficits  HEENT: wnl Cardiovascular: Regular rate and rhythm  Lungs: Decreased BS in   Abd:  Soft, non-tender, non-distended, normal BS Musculoskeletal: No edema, SCD's Skin:  No rashes  LABS:  CBC  Recent Labs Lab 10/07/13 0320 10/08/13 0415 10/09/13 0445  WBC 8.6 4.9 3.9*  HGB 9.1* 7.8* 7.5*  HCT 31.5* 25.8* 24.8*  PLT 241 208 192   Coag's  Recent Labs Lab 10/06/13 0546  10/07/13 1300 10/07/13 2330 10/08/13 0415  APTT  --   < > 58* 75* 62*  INR 1.74*  --   --   --   --   < > = values in this interval not displayed. BMET  Recent Labs Lab 10/08/13 0415 10/08/13 1700 10/09/13 0445  NA 140 142 141  K 3.3* 4.1 3.9  CL 109 109 109  CO2 19 22 22   BUN 19 17 14   CREATININE 0.62 0.59 0.57  GLUCOSE 108* 104* 84   Electrolytes  Recent Labs Lab 10/07/13 0230  10/07/13 1625 10/08/13 0415 10/08/13  1700 10/09/13 0445  CALCIUM  --   < > 7.4* 7.1* 7.4* 7.8*  MG 1.8  --  2.4 2.7*  --  2.4  PHOS 1.6*  --   --  2.1*  --  3.0  < > = values in this interval not displayed. Sepsis Markers  Recent Labs Lab 10/06/13 2315  10/07/13 0030 10/07/13 1430 10/08/13 0415 10/09/13 0445  LATICACIDVEN 3.7*  --   --  1.3 1.0  --   PROCALCITON  --   < > 1.39  --  21.26 10.34  < > = values in this interval not displayed. ABG  Recent Labs Lab 10/06/13 2300 10/07/13 0200 10/07/13 0500  PHART 7.266* 7.299* 7.371  PCO2ART 44.1 51.0* 39.1  PO2ART 84.9 217.0* 105.0*   Liver Enzymes  Recent Labs Lab 10/06/13 0001 10/06/13 0546 10/08/13 0415  AST  79* 65* 95*  ALT 77* 66* 87*  ALKPHOS 90 86 87  BILITOT 0.6 0.4 0.5  ALBUMIN 2.9* 2.6* 2.1*   Cardiac Enzymes  Recent Labs Lab 10/06/13 2249 10/07/13 0030 10/07/13 0320 10/07/13 1049  TROPONINI <0.30  --  <0.30 <0.30  PROBNP  --  3141.0*  --   --    Glucose  Recent Labs Lab 10/08/13 0010 10/08/13 0415 10/08/13 0743 10/08/13 1536 10/08/13 1914 10/08/13 2351  GLUCAP 112* 106* 122* 104* 106* 91    Imaging Dg Chest Port 1 View  10/09/2013   CLINICAL DATA:  Shortness of breath, cough, hospital acquired pneumonia  EXAM: PORTABLE CHEST - 1 VIEW  COMPARISON:  DG CHEST 1V PORT dated 10/08/2013; DG CHEST 2 VIEW dated 08/22/2013; CT ANGIO CHEST W/CM &/OR WO/CM dated 08/22/2013; DG CHEST 1V PORT dated 05/04/2013; CT ANGIO CHEST AORTA W/CM & WO/CM dated 10/06/2013  FINDINGS: Interval increase in now moderate size left-sided effusion with worsening obscuration of the right border. Otherwise, grossly unchanged cardiac silhouette and mediastinal contours post aortic arch and descending thoracic aortic stent graft repair. Post median sternotomy aortic valve replacement. Interval extubation removal of enteric tube. Interval removal of external pacing pads. Otherwise, stable position of support apparatus. Improved aeration of the right lung. Grossly unchanged bones.  IMPRESSION: 1. Interval extubation removal of enteric tube.  No pneumothorax. 2. Interval increase in now moderate size left-sided effusion with associated worsening left mid and lower lung consolidative opacities, likely atelectasis.   Electronically Signed   By: Simonne ComeJohn  Watts M.D.   On: 10/09/2013 07:54   Dg Chest Port 1 View  10/08/2013   CLINICAL DATA:  ET tube check  EXAM: PORTABLE CHEST - 1 VIEW  COMPARISON:  DG CHEST 1V PORT dated 10/07/2013; CT ANGIO CHEST W/CM &/OR WO/CM dated 08/22/2013; DG CHEST 1V PORT dated 05/04/2013; DG CHEST 1V PORT dated 05/12/2013; DG CHEST 2 VIEW dated 05/09/2013  FINDINGS: There is an endotracheal tube with the  tip 7.5 cm above the carina. There is a nasogastric tube coursing below the diaphragm. There is a right jugular central venous catheter with the tip projecting over the SVC.  There is bilateral diffuse interstitial thickening. There is a small left pleural effusion and left basilar airspace disease likely representing atelectasis. There is hazy airspace disease adjacent to the aortic arch in the left lung which may reflect atelectasis versus false lumen from the aortic dissection. . There is no pneumothorax. There is an aortic stent graft noted. Prior median sternotomy. Prosthetic aortic valve is noted.  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  IMPRESSION: 1. Endotracheal tube  7.5 cm above the carina. 2. Stable pulmonary edema.  Stable cardiomegaly.   Electronically Signed   By: Elige Ko   On: 10/08/2013 07:45    ASSESSMENT / PLAN:  PULMONARY A:   Acute respiratory failure requiring ventilator support - resolved  - extubated on 4/22. Currently on 2L [place] O2 HCAP Worsening left pleural effusion  -exudative vs transudative > suspect parapneumonic effusion Elevated d-dimer (18.73 on 4/20)   -LE DUS negative P:   Supplemental O2 to keep SpO2 >92% Thoracentesis tomorrow in bronch suite PT consult  CARDIOVASCULAR A:   Septic Shock   -off pressor support Vtach on 4/21 - resolved   - most likely due torsades  Chronic Prolonged QT   - 527, unchanged from yesterday Grade 3 Diastolic CHF   - on 2D-echo 08/2013  Atrial Fibrillation on apixaban and amiodarone  S/p aortic valve replacement on apixaban at home 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  - no evidence of leakage on CT  P:  Hold home apixaban for thoracentesis tomm  D/c  NS 192mL/hr D/C central line  Continue home amiodarone Continue home lopressor 25 mg BID  Avoid QT prolonging medications  Appreciate cardiology consult   RENAL A:    Hypokalemia - resolved Hypophosphatemia - resolved Low  Magnesium - resolved Anion Gap Metabolic acidosis - resolved Lactic acidosis - resolved P:   Continue home flomax Monitor BMP Replete electrolytes as needed Resume hme 40 mEq potassium daily   GASTROINTESTINAL A:GERD P:    Heart Healthy diet  PO Protonix 40 mg daily   HEMATOLOGIC A:    Normocytic Anemia Afib on chronic AC therapVT Ppx P:  Follow CBC   Hold home apixaban for thoracentesis tomm SCD's  INFECTIOUS A:    Severe sepsis -improving   - most likely due to HCAP and recent UT P:   Continue vanco and zosyn F/U blood cx  Follow CBC and PCT  ENDOCRINE A:  Cortisol level 10.3 on 4/21 P:   Monitor BMP    NEUROLOGIC A: No acute issues P:   Ambien PRN insomnia   Marjan Rabbani PGY-1 IMTS  10/09/2013, 6:44 AM  Attending:  I have seen and examined the patient with nurse practitioner/resident and agree with the note above.   He is doing well, appreciate cardiology seeing him Ready for transfer to floor Needs thoracentesis on left effusion given possible HCAP and recent surgery Have arranged for it tomorrow morning at 10AM in bronch suite  Heber Ames, MD [place] [facility] Pager: [phone] Cell: [phone] If no response, call [phone]

## 2013-10-09 NOTE — Progress Notes (Signed)
[facility]  Subjective  - Alert and oriented.  Sitting in bedside chair.   Objective 170/63 86 98.3 F (36.8 C) (Oral) 22 98%  Intake/Output Summary (Last 24 hours) at 10/09/13 1029 Last data filed at 10/09/13 0700  Gross per 24 hour  Intake 1857.88 ml  Output   1100 ml  Net 757.88 ml    Left supraclavicular incision is well healed. Palpable radial pulses bil. Equal.  Assessment/Planning: Surgeon: Jorge Ny  Co-surgeon: Ofilia Neas  Procedure: #1: Left carotid subclavian bypass with 8 mm dacryon  #2: Ligation of proximal left subclavian artery  #3: Ultrasound-guided access, left common femoral artery  #4: Catheter in aorta x2  #5: Aortic arch angiogram  #6: Endovascular repair of thoracic aortic aneurysm  #7: Distal extension x1  Plan for thorocentesis 10/10/2013     Lars Mage 10/09/2013 10:29 AM --  Laboratory Lab Results:  Recent Labs  10/08/13 0415 10/09/13 0445  WBC 4.9 3.9*  HGB 7.8* 7.5*  HCT 25.8* 24.8*  PLT 208 192   BMET  Recent Labs  10/08/13 1700 10/09/13 0445  NA 142 141  K 4.1 3.9  CL 109 109  CO2 22 22  GLUCOSE 104* 84  BUN 17 14  CREATININE 0.59 0.57  CALCIUM 7.4* 7.8*    COAG Lab Results  Component Value Date   INR 1.74* 10/06/2013   INR 1.22 08/27/2013   INR 1.59* 08/22/2013   No results found for this basename: PTT

## 2013-10-09 NOTE — Evaluation (Signed)
Physical Therapy Evaluation Patient Details Name: [name] [name] MRN: [id number] DOB: June 29, 1940 Today's Date: 10/09/2013   History of Present Illness  Pt adm with VDRF, sepsis, UTI, PNA. 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 Pt currently with functional limitations due to the deficits listed below (see PT Problem List).  Pt will benefit from skilled PT to increase their independence and safety with mobility to allow discharge home with wife. Expect pt to make good progress.     Follow Up Recommendations No PT follow up;Supervision/Assistance - 24 hour (initially)    Equipment Recommendations  None recommended by PT    Recommendations for Other Services       Precautions / Restrictions Precautions Precautions: Fall      Mobility  Bed Mobility                  Transfers Overall transfer level: Needs assistance Equipment used: Rolling walker (2 wheeled) Transfers: Sit to/from Stand Sit to Stand: Min guard         General transfer comment: for safety and lines  Ambulation/Gait Ambulation/Gait assistance: Min guard Ambulation Distance (Feet): 150 Feet Assistive device: Rolling walker (2 wheeled) Gait Pattern/deviations: Step-through pattern;Decreased stride length     General Gait Details: assist for safety and lines.  Stairs            Wheelchair Mobility    Modified Rankin (Stroke Patients Only)       Balance Overall balance assessment: Needs assistance         Standing balance support: No upper extremity supported Standing balance-Leahy Scale: Fair                               Pertinent Vitals/Pain SaO2 94-96% on RA with amb.    Home Living Family/patient expects to be discharged to:: Private residence Living Arrangements: Spouse/significant other Available Help at Discharge: Family;Available 24  hours/day Type of Home: Mobile home Home Access: Stairs to enter Entrance Stairs-Rails: Right Entrance Stairs-Number of Steps: 4 Home Layout: One level Home Equipment: Walker - 2 wheels;Grab bars - tub/shower;Other (comment);Cane - single point (walking stick)      Prior Function Level of Independence: Needs assistance   Gait / Transfers Assistance Needed: Amb with walking stick (bought at [facility] gift shop)  ADL's / Homemaking Assistance Needed: Supervision for in/out of shower.        Hand Dominance        Extremity/Trunk Assessment   Upper Extremity Assessment: Overall WFL for tasks assessed           Lower Extremity Assessment: Generalized weakness         Communication   Communication: No difficulties  Cognition Arousal/Alertness: Awake/alert Behavior During Therapy: WFL for tasks assessed/performed Overall Cognitive Status: Within Functional Limits for tasks assessed                      General Comments      Exercises        Assessment/Plan    PT Assessment Patient needs continued PT services  PT Diagnosis Difficulty walking;Generalized weakness   PT Problem List Decreased strength;Decreased activity tolerance;Decreased mobility;Decreased balance  PT Treatment Interventions DME instruction;Gait training;Therapeutic activities;Therapeutic exercise;Balance training;Functional mobility training;Patient/family education;Stair training   PT Goals (Current goals can be found in the Care Plan  section) Acute Rehab PT Goals Patient Stated Goal: Return home PT Goal Formulation: With patient Time For Goal Achievement: 10/16/13 Potential to Achieve Goals: Good    Frequency Min 3X/week   Barriers to discharge        Co-evaluation               End of Session   Activity Tolerance: Patient tolerated treatment well Patient left: in chair Nurse Communication: Mobility status         Time: [id number] PT Time Calculation (min): 24  min   Charges:   PT Evaluation $Initial PT Evaluation Tier I: 1 Procedure PT Treatments $Gait Training: 8-22 mins   PT G CodesAngelina Ok:          Satine Hausner W Zackrey Dyar 10/09/2013, 12:20 PM  [employer] CorporationCary Carrera Kiesel PT [phone]

## 2013-10-09 NOTE — Progress Notes (Signed)
NUTRITION FOLLOW UP  Intervention:    Diet advancement to heart healthy per patient preference.  Resource Breeze PO TID, each supplement provides 250 kcal and 9 grams of protein  Nutrition Dx:   Inadequate oral intake now related to poor appetite as evidenced by full liquid diet.   Goal:   Intake to meet >90% of estimated nutrition needs.  Monitor:   Diet advancement, PO intake, labs, weight trend.  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 He was transferred to ICU and intubated in the early morning of 4/21.   Patient was extubated on 4/22. TF was discontinued before extubation yesterday morning. Patient is now on a full liquid diet. Ready to try some solid foods.  Height: Ht Readings from Last 1 Encounters:  10/06/13 5\' 10"  (1.778 m)    Weight Status:   Wt Readings from Last 1 Encounters:  10/09/13 190 lb 0.6 oz (86.2 kg)    Re-estimated needs:  Kcal: 1900-2100 Protein: 95-110 gm Fluid: > 2 L  Skin: no wounds  Diet Order: Full Liquid   Intake/Output Summary (Last 24 hours) at 10/09/13 1005 Last data filed at 10/09/13 0700  Gross per 24 hour  Intake 1870.38 ml  Output   1100 ml  Net 770.38 ml    Last BM: 4/19   Labs:   Recent Labs Lab 10/07/13 0230  10/07/13 1625 10/08/13 0415 10/08/13 1700 10/09/13 0445  NA  --   < > 139 140 142 141  K  --   < > 4.2 3.3* 4.1 3.9  CL  --   < > 106 109 109 109  CO2  --   < > 20 19 22 22   BUN  --   < > 20 19 17 14   CREATININE  --   < > 0.72 0.62 0.59 0.57  CALCIUM  --   < > 7.4* 7.1* 7.4* 7.8*  MG 1.8  --  2.4 2.7*  --  2.4  PHOS 1.6*  --   --  2.1*  --  3.0  GLUCOSE  --   < > 118* 108* 104* 84  < > = values in this interval not displayed.  CBG (last 3)   Recent Labs  10/08/13 1536 10/08/13 1914  10/08/13 2351  GLUCAP 104* 106* 91    Scheduled Meds: . amiodarone  200 mg Oral Daily  . antiseptic oral rinse  15 mL Mouth Rinse q12n4p  . metoprolol  25 mg Oral BID  . pantoprazole  40 mg Oral Daily  . piperacillin-tazobactam (ZOSYN)  IV  3.375 g Intravenous Q8H  . tamsulosin  0.4 mg Oral BID  . vancomycin  1,250 mg Intravenous Q12H    Continuous Infusions: . sodium chloride 1,000 mL (10/08/13 1328)    Joaquin Courts, RD, LDN, CNSC Pager [phone] After Hours Pager [phone]

## 2013-10-09 NOTE — Progress Notes (Signed)
ANTICOAGULATION CONSULT NOTE  Pharmacy Consult for Apixaban  Indication: atrial fibrillation  Allergies  Allergen Reactions  . Lortab [Hydrocodone-Acetaminophen] Hives  . Morphine And Related Hives    Patient Measurements: Height: 5\' 10"  (177.8 cm) Weight: 190 lb 0.6 oz (86.2 kg) IBW/kg (Calculated) : 73 Vital Signs: Temp: 98.3 F (36.8 C) (04/23 0700) Temp src: Oral (04/23 0700) BP: 170/63 mmHg (04/23 1000) Pulse Rate: 86 (04/23 1000)  Labs:  Recent Labs  10/06/13 2249  10/07/13 0320  10/07/13 0540 10/07/13 1049 10/07/13 1300  10/07/13 2330 10/08/13 0415 10/08/13 1700 10/09/13 0445  HGB  --   < > 9.1*  --   --   --   --   --   --  7.8*  --  7.5*  HCT  --   --  31.5*  --   --   --   --   --   --  25.8*  --  24.8*  PLT  --   --  241  --   --   --   --   --   --  208  --  192  APTT  --   --   --   < > 32  --  58*  --  75* 62*  --   --   HEPARINUNFRC  --   --   --   --  1.52*  --   --   --   --  0.70  --   --   CREATININE  --   < > 0.85  --   --   --   --   < >  --  0.62 0.59 0.57  TROPONINI <0.30  --  <0.30  --   --  <0.30  --   --   --   --   --   --   < > = values in this interval not displayed.  Estimated Creatinine Clearance: 83.6 ml/min (by C-G formula based on Cr of 0.57).  Medical History: Past Medical History  Diagnosis Date  . Hyperlipidemia   . DVT (deep venous thrombosis)     post knee surgery  . Tobacco abuse   . HTN (hypertension)   . Descending aortic aneurysm   . Aortic valve insufficiency   . Thoracic aneurysm   . S/P AVR (aortic valve replacement)   . CAD (coronary artery disease)   . Atrial flutter   . CHF (congestive heart failure)     Medications:  Apixaban PTA  Assessment: 73 y/o M here with fever/UTI on apixaban PTA for afib. Noted Hgb of 7.5 which is stable since admission, will follow closely. SCr stable at 0.57 (CrCl ~83 ml/min). Patient for thoracentesis tomorrow so will hold Eliquis today.   Goal of Therapy:  Monitor  platelets by anticoagulation protocol: Yes   Plan:  - Hold Eliquis today for thoracentesis tomorrow - Continue to monitor renal function, CBC, s/s bleeding  Margie Billet, PharmD Clinical Pharmacist - Resident Pager: [phone] Pharmacy: [phone] 10/09/2013 10:10 AM

## 2013-10-10 ENCOUNTER — Inpatient Hospital Stay (HOSPITAL_COMMUNITY): Payer: Medicare Other

## 2013-10-10 DIAGNOSIS — I498 Other specified cardiac arrhythmias: Secondary | ICD-10-CM

## 2013-10-10 DIAGNOSIS — I509 Heart failure, unspecified: Secondary | ICD-10-CM

## 2013-10-10 DIAGNOSIS — I5033 Acute on chronic diastolic (congestive) heart failure: Secondary | ICD-10-CM

## 2013-10-10 LAB — BASIC METABOLIC PANEL
BUN: 12 mg/dL (ref 6–23)
CHLORIDE: 108 meq/L (ref 96–112)
CO2: 22 mEq/L (ref 19–32)
CREATININE: 0.59 mg/dL (ref 0.50–1.35)
Calcium: 8.1 mg/dL — ABNORMAL LOW (ref 8.4–10.5)
GFR calc non Af Amer: >90 mL/min (ref >90)
Glucose, Bld: 95 mg/dL (ref 70–99)
POTASSIUM: 4 meq/L (ref 3.7–5.3)
SODIUM: 140 meq/L (ref 137–147)

## 2013-10-10 LAB — BODY FLUID CELL COUNT WITH DIFFERENTIAL
EOS FL: 0 %
Lymphs, Fluid: 65 %
Monocyte-Macrophage-Serous Fluid: 25 % — ABNORMAL LOW (ref 50–90)
NEUTROPHIL FLUID: 10 % (ref 0–25)
WBC FLUID: 1966 uL — AB (ref 0–1000)

## 2013-10-10 LAB — CBC
HCT: 26.9 % — ABNORMAL LOW (ref 39.0–52.0)
HEMOGLOBIN: 7.9 g/dL — AB (ref 13.0–17.0)
MCH: 27 pg (ref 26.0–34.0)
MCHC: 29.4 g/dL — ABNORMAL LOW (ref 30.0–36.0)
MCV: 91.8 fL (ref 78.0–100.0)
PLATELETS: 214 10*3/uL (ref 150–400)
RBC: 2.93 MIL/uL — AB (ref 4.22–5.81)
RDW: 17.9 % — ABNORMAL HIGH (ref 11.5–15.5)
WBC: 4.4 10*3/uL (ref 4.0–10.5)

## 2013-10-10 LAB — PROTEIN, BODY FLUID: Total protein, fluid: 2.5 g/dL

## 2013-10-10 LAB — CHOLESTEROL, TOTAL: Cholesterol: 152 mg/dL (ref 0–200)

## 2013-10-10 LAB — PHOSPHORUS: PHOSPHORUS: 2.5 mg/dL (ref 2.3–4.6)

## 2013-10-10 LAB — LACTATE DEHYDROGENASE, PLEURAL OR PERITONEAL FLUID: LD, Fluid: 138 U/L — ABNORMAL HIGH (ref 3–23)

## 2013-10-10 LAB — PROTEIN, TOTAL: TOTAL PROTEIN: 5.9 g/dL — AB (ref 6.0–8.3)

## 2013-10-10 LAB — LACTATE DEHYDROGENASE
LDH: 289 U/L — ABNORMAL HIGH (ref 94–250)
LDH: 293 U/L — ABNORMAL HIGH (ref 94–250)

## 2013-10-10 LAB — PROCALCITONIN: Procalcitonin: 5.3 ng/mL

## 2013-10-10 LAB — MAGNESIUM: MAGNESIUM: 2.2 mg/dL (ref 1.5–2.5)

## 2013-10-10 MED ORDER — METOPROLOL TARTRATE 12.5 MG HALF TABLET
12.5000 mg | ORAL_TABLET | Freq: Two times a day (BID) | ORAL | Status: DC
  Administered 2013-10-10 – 2013-10-11 (×2): 12.5 mg via ORAL
  Filled 2013-10-10 (×3): qty 1

## 2013-10-10 MED ORDER — FUROSEMIDE 40 MG PO TABS
40.0000 mg | ORAL_TABLET | Freq: Two times a day (BID) | ORAL | Status: DC
  Administered 2013-10-10 – 2013-10-11 (×2): 40 mg via ORAL
  Filled 2013-10-10 (×4): qty 1

## 2013-10-10 MED ORDER — APIXABAN 5 MG PO TABS
5.0000 mg | ORAL_TABLET | Freq: Two times a day (BID) | ORAL | Status: DC
  Administered 2013-10-10 – 2013-10-11 (×2): 5 mg via ORAL
  Filled 2013-10-10 (×3): qty 1

## 2013-10-10 NOTE — Care Management Note (Unsigned)
    Page 1 of 1   10/10/2013     4:15:16 PM CARE MANAGEMENT NOTE 10/10/2013  Patient:  [name] [name], [name] [name]   Account Number:  [id number]  Date Initiated:  10/07/2013  Documentation initiated by:  Avie Arenas  Subjective/Objective Assessment:   ADmitted with UTI - became septic - intubated - pressors     Action/Plan:   Anticipated DC Date:  10/14/2013   Anticipated DC Plan:  HOME W HOME HEALTH SERVICES         Choice offered to / List presented to:             Status of service:  In process, will continue to follow Medicare Important Message given?   (If response is "NO", the following Medicare IM given date fields will be blank) Date Medicare IM given:   Date Additional Medicare IM given:    Discharge Disposition:    Per UR Regulation:  Reviewed for med. necessity/level of care/duration of stay  If discussed at Long Length of Stay Meetings, dates discussed:    Comments:  Contact:  [place],Sue Spouse [phone]                 [facility] Daughter [phone]  10/10/13 Sidney Ace, RN, BSN [phone] P.T.  recommending  HH at dc.  Will follow progress.

## 2013-10-10 NOTE — Procedures (Signed)
Thoracentesis Procedure Note  Pre-operative Diagnosis: left effusion, unclear etiolo  Post-operative Diagnosis: same  Indications: sob, large effusion  Procedure Details  Consent: Informed consent was obtained. Risks of the procedure were discussed including: infection, bleeding, pain, pneumothorax.  Under sterile conditions the patient was positioned. Betadine solution and sterile drapes were utilized.  1% buffered lidocaine was used to anesthetize the 5th rib space. Fluid was obtained without any difficulties and minimal blood loss.  A dressing was applied to the wound and wound care instructions were provided.   Findings 1250 amber ml of clear pleural fluid was obtained. A sample was sent to Pathology for cytogenetics, flow, and cell counts, as well as for infection analysis.  Complications:  None; patient tolerated the procedure well.          Condition: stable  Plan A follow up chest x-ray was ordered. Bed Rest for 5 hours. Tylenol 650 mg. for pain.  Attending Attestation: I performed the procedure.  [place] guifance  Mcarthur Rossetti. Tyson Alias, MD, FACP Pgr: [phone] [name]

## 2013-10-10 NOTE — Progress Notes (Signed)
Subjective:  Thoracentesis today Feels better.  Huston FoleyBrady recently.   Objective:  Vital Signs in the last 24 hours: Temp:  [97.6 F (36.4 C)-99.2 F (37.3 C)] 99.2 F (37.3 C) (04/24 1300) Pulse Rate:  [46-89] 46 (04/24 1300) Resp:  [17-21] 20 (04/24 1300) BP: (118-164)/(39-70) 120/41 mmHg (04/24 1300) SpO2:  [91 %-97 %] 97 % (04/24 1300) Weight:  [187 lb 12.8 oz (85.186 kg)] 187 lb 12.8 oz (85.186 kg) (04/24 0607)  Intake/Output from previous day: 04/23 0701 - 04/24 0700 In: 670 [P.O.:120; I.V.:200; IV Piggyback:350] Out: 500 [Urine:500]   Physical Exam: General: Well developed, well nourished, in no acute distress. Head:  Normocephalic and atraumatic. Lungs: Improved BS.  Heart: Normal S1 and S2.  Abdomen: soft, non-tender, positive bowel sounds. Extremities: No clubbing or cyanosis. No edema. Neurologic: Alert and oriented x 3.    Lab Results:  Recent Labs  10/09/13 0445 10/10/13 0409  WBC 3.9* 4.4  HGB 7.5* 7.9*  PLT 192 214    Recent Labs  10/09/13 0445 10/10/13 0409  NA 141 140  K 3.9 4.0  CL 109 108  CO2 22 22  GLUCOSE 84 95  BUN 14 12  CREATININE 0.57 0.59   No results found for this basename: TROPONINI, CK, MB,  in the last 72 hours Hepatic Function Panel  Recent Labs  10/08/13 0415 10/10/13 1220  PROT 5.1* 5.9*  ALBUMIN 2.1*  --   AST 95*  --   ALT 87*  --   ALKPHOS 87  --   BILITOT 0.5  --   BILIDIR 0.2  --   IBILI 0.3  --     Recent Labs  10/10/13 1220  CHOL 152    Imaging: Dg Chest Port 1 View  10/10/2013   CLINICAL DATA:  post thoracentesis  EXAM: PORTABLE CHEST - 1 VIEW  COMPARISON:  DG CHEST 1V PORT dated 10/09/2013  FINDINGS: Cardiac silhouette is enlarged. There has been significant decreased size of the left pleural effusion consistent with thoracentesis. There is no evidence of a pneumothorax. Increased density is appreciated within the left lung base differential considerations residual effusion versus atelectasis  or infiltrate. No further focal regions of consolidation nor infiltrate. 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 A right internal jugular central venous catheter tip at the level superior vena cava. Patient is status post median sternotomy. Mild degenerative changes are appreciated within the shoulders.  IMPRESSION: Improved left pleural effusion consistent with thoracentesis.  No pneumothorax  Atelectasis versus infiltrate versus residual effusion left lung base   Electronically Signed   By: Salome HolmesHector  Cooper M.D.   On: 10/10/2013 11:40   Dg Chest Port 1 View  10/09/2013   CLINICAL DATA:  Shortness of breath, cough, hospital acquired pneumonia  EXAM: PORTABLE CHEST - 1 VIEW  COMPARISON:  DG CHEST 1V PORT dated 10/08/2013; DG CHEST 2 VIEW dated 08/22/2013; CT ANGIO CHEST W/CM &/OR WO/CM dated 08/22/2013; DG CHEST 1V PORT dated 05/04/2013; CT ANGIO CHEST AORTA W/CM & WO/CM dated 10/06/2013  FINDINGS: Interval increase in now moderate size left-sided effusion with worsening obscuration of the right border. Otherwise, grossly unchanged cardiac silhouette and mediastinal contours post aortic arch and descending thoracic aortic stent graft repair. Post median sternotomy aortic valve replacement. Interval extubation removal of enteric tube. Interval removal of external pacing pads. Otherwise, stable position of support apparatus. Improved aeration of the right lung. Grossly unchanged bones.  IMPRESSION: 1. Interval extubation removal of enteric  tube.  No pneumothorax. 2. Interval increase in now moderate size left-sided effusion with associated worsening left mid and lower lung consolidative opacities, likely atelectasis.   Electronically Signed   By: Simonne Come M.D.   On: 10/09/2013 07:54   Personally viewed.   Telemetry: brady at times in morning.  Personally viewed.   ECG:  Last QT 480  Assessment/Plan:  Principal Problem:   UTI (urinary tract infection) Active Problems:   HTN  (hypertension)   Aortic dissection   Anemia   CAD (coronary artery disease)   S/P AVR (aortic valve replacement)   Chronic systolic congestive heart failure   Atrial fibrillation   Recurrent UTI   Urinary retention   Acute respiratory failure with hypoxia   Shock circulatory  Complex CV history. AVR (bio), ascending and arch/proximal descending stent graft.  QT was in the setting of NSVT (torsades) likely exacerbated by metabolic derangement in the seting of septic shock.  Keep K >4, Mag>2.   ECG now demonstrates much improved QT interval.  Note: has a history of post op VT after cardiac surgery. ([name] note reviewed).  Recent ECHO in March - EF 50%  Continue with AMIODARONE  -Reduce metoprolol to 12.5 BID with bradycardia.     Mark Skains 10/10/2013, 2:10 PM

## 2013-10-10 NOTE — Progress Notes (Signed)
Thoracentesis done by Dr. Tyson Alias. No complications noted. Pt monitored X post procedure with no complications.  Spo2 96% on RA, HR 84, BP 146/53, PCXR has been done and MD aware of results. Pt being transported back to room.

## 2013-10-10 NOTE — Procedures (Signed)
At 10 am  [place] real time chest left  1. Large free flowing effusion, small lung flap 8 cm away from wall  Mcarthur Rossetti. Tyson Alias, MD, FACP Pgr: [phone] [facility]

## 2013-10-10 NOTE — Progress Notes (Signed)
[facility] Progress Note  10/10/2013 7:31 AM   Subjective:  Complaining about breakfast; states he is breathing okay and using spirometer.  afebrile  Filed Vitals:   10/10/13 0607  BP: 126/60  Pulse: 74  Temp: 98.4 F (36.9 C)  Resp: 18    Physical Exam: Incisions:  Well healed Extremities:  Left hand with 2+ radial pulse.  CBC    Component Value Date/Time   WBC 4.4 10/10/2013 0409   RBC 2.93* 10/10/2013 0409   RBC 2.89* 10/06/2013 1202   HGB 7.9* 10/10/2013 0409   HCT 26.9* 10/10/2013 0409   PLT 214 10/10/2013 0409   MCV 91.8 10/10/2013 0409   MCH 27.0 10/10/2013 0409   MCHC 29.4* 10/10/2013 0409   RDW 17.9* 10/10/2013 0409   LYMPHSABS 0.4* 10/06/2013 0546   MONOABS 0.5 10/06/2013 0546   EOSABS 0.1 10/06/2013 0546   BASOSABS 0.0 10/06/2013 0546    BMET    Component Value Date/Time   NA 140 10/10/2013 0409   K 4.0 10/10/2013 0409   CL 108 10/10/2013 0409   CO2 22 10/10/2013 0409   GLUCOSE 95 10/10/2013 0409   BUN 12 10/10/2013 0409   CREATININE 0.59 10/10/2013 0409   CALCIUM 8.1* 10/10/2013 0409   GFRNONAA >90 10/10/2013 0409   GFRAA >90 10/10/2013 0409    INR    Component Value Date/Time   INR 1.74* 10/06/2013 0546     Intake/Output Summary (Last 24 hours) at 10/10/13 0731 Last data filed at 10/10/13 0121  Gross per 24 hour  Intake    670 ml  Output    350 ml  Net    320 ml     Assessment:  73 y.o. male is s/p:  #1: Left carotid subclavian bypass with 8 mm dacryon  #2: Ligation of proximal left subclavian artery  #3: Ultrasound-guided access, left common femoral artery  #4: Catheter in aorta x2  #5: Aortic arch angiogram  #6: Endovascular repair of thoracic aortic aneurysm  #7: Distal extension x1  08/26/13 Readmitted with CHF and sepsis  Plan: -doing well from surgical standpoint -for thoracentesis today    Doreatha Massed, PA-C [facility] [phone] 10/10/2013 7:31 AM

## 2013-10-10 NOTE — Progress Notes (Signed)
Physical Therapy Treatment Patient Details Name: [name] [name] MRN: [id number] DOB: 06/11/1941 Today's Date: 11-02-13    History of Present Illness Pt adm with VDRF, sepsis, UTI, PNA. 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  Follow Up Recommendations  Home health PT;Supervision/Assistance - 24 hour     Equipment Recommendations  None recommended by PT    Recommendations for Other Services       Precautions / Restrictions Precautions Precautions: Fall    Mobility  Bed Mobility                  Transfers Overall transfer level: Needs assistance Equipment used: Rolling walker (2 wheeled) Transfers: Sit to/from Stand Sit to Stand: Min guard         General transfer comment: for safety and lines  Ambulation/Gait Ambulation/Gait assistance: Min guard Ambulation Distance (Feet): 200 Feet Assistive device: Rolling walker (2 wheeled) Gait Pattern/deviations: Step-through pattern;Decreased stride length     General Gait Details: verbal cues to stand more erect   Stairs            Wheelchair Mobility    Modified Rankin (Stroke Patients Only)       Balance           Standing balance support: Single extremity supported Standing balance-Leahy Scale: Poor                      Cognition Arousal/Alertness: Awake/alert Behavior During Therapy: WFL for tasks assessed/performed Overall Cognitive Status: Within Functional Limits for tasks assessed                      Exercises      General Comments        Pertinent Vitals/Pain See flow sheet.    Home Living                      Prior Function            PT Goals (current goals can now be found in the care plan section) Progress towards PT goals: Progressing toward goals    Frequency  Min 3X/week    PT Plan Discharge plan needs to be  updated    Co-evaluation             End of Session Equipment Utilized During Treatment: Gait belt Activity Tolerance: Patient tolerated treatment well Patient left: in chair;with call bell/phone within reach;with family/visitor present     Time: 3710-6269 PT Time Calculation (min): 17 min  Charges:                       G CodesAngelina Ok Barkley [place] 02-Nov-2013, 2:43 PM  [employer] PT [phone]

## 2013-10-10 NOTE — Progress Notes (Signed)
[facility] PROGRESS NOTE  [name] [name] UJW:[id number] DOB: February 12, 1941 DOA: 10/05/2013 PCP: [name]., MD  Assessment/Plan: 1. Acute hypoxemic respiratory failure -Status post rapid sequence intubation, admitted to the intensive care unit, extubated and 10/08/2013 -Likely secondary to ARDS/healthcare associated pneumonia -Respiratory failure that resolved  2. Left pleural effusion -Likely secondary to parapneumonic effusion -Status post thoracentesis performed on 10/10/2013, 1250 mL of amber pleural fluid removed -Fluid studies pending  3. Nonsustained ventricular tachycardia/QT prolongation -Likely secondary to critical illness/sepsis -Stable now -Remains on amiodarone, cardiology following  4. Grade 3 diastolic congestive heart failure -Lasix restarted on 10/11/1998 1540 mg by mouth twice a day. -Presently appears compensated  5. Atrial fibrillation -Currently on metoprolol, rate controlled -Continue anticoagulation with apixaban -Continue amiodarone  6. Healthcare associated pneumonia -He is on IV Zosyn and vancomycin -Clinically improving, anticipate transitioning to oral antimicrobial therapy in the next 24-48 hours if he remains afebrile  7. Bradycardia -Patient having heart rates in the 40s this afternoon, could be secondary to beta blocker therapy -Decrease in metoprolol dose to 12.5 mg by mouth twice a day  Code Status: Full Code Family Communication: I spoke with patient's wife at bedside Disposition Plan: continue monitoring    Consultants:  PCCM  Cardiology  Vascular Surgery  4/19 Admitted for VDRF with shock requiring pressor support  4/20 CTA: thoracic aortic stent graft with exclusion of the previously seen aortic dissection. No evidence of endoleak.  Stable right upper lobe pulmonary nodule. Left lower lobe consolidation with associated effusion. Patent left common carotid artery to left subclavian artery bypass with some  retrograde filling of the more proximal left subclavian artery. 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 He was transferred to ICU and PCCM to assume care.    Objective: Filed Vitals:   10/10/13 1424  BP:   Pulse: 84  Temp:   Resp:     Intake/Output Summary (Last 24 hours) at 10/10/13 1601 Last data filed at 10/10/13 0700  Gross per 24 hour  Intake    170 ml  Output    350 ml  Net   -180 ml   Filed Weights   10/08/13 0500 10/09/13 0500 10/10/13 0607  Weight: 82.8 kg (182 lb 8.7 oz) 86.2 kg (190 lb 0.6 oz) 85.186 kg (187 lb 12.8 oz)    Exam:   General:  Patient states feeling better, breathing comfortably, sitting at bedside chair, had a thoracentesis earlier  Cardiovascular: Regular rate and rhythm, normal S1S2  Respiratory: Has crackles to his left lower lung, normal inspiratory effort  Abdomen: Soft nontender nondistended  Musculoskeletal: 1+ b/l LE edema  Data Reviewed: Basic Metabolic Panel:  Recent Labs Lab 10/07/13 0230  10/07/13 1625 10/08/13 0415 10/08/13 1700 10/09/13 0445 10/10/13 0409  NA  --   < > 139 140 142 141 140  K  --   < > 4.2  3.3* 4.1 3.9 4.0  CL  --   < > 106 109 109 109 108  CO2  --   < > 20 19 22 22 22   GLUCOSE  --   < > 118* 108* 104* 84 95  BUN  --   < > 20 19 17 14 12   CREATININE  --   < > 0.72 0.62 0.59 0.57 0.59  CALCIUM   --   < > 7.4* 7.1* 7.4* 7.8* 8.1*  MG 1.8  --  2.4 2.7*  --  2.4 2.2  PHOS 1.6*  --   --  2.1*  --  3.0 2.5  < > = values in this interval not displayed. Liver Function Tests:  Recent Labs Lab 10/06/13 0001 10/06/13 0546 10/08/13 0415 10/10/13 1220  AST 79* 65* 95*  --   ALT 77* 66* 87*  --   ALKPHOS 90 86 87  --   BILITOT 0.6 0.4 0.5  --   PROT 6.4 5.8* 5.1* 5.9*  ALBUMIN 2.9* 2.6* 2.1*  --    No results found for this basename: LIPASE, AMYLASE,  in the last 168 hours No results found for this basename: AMMONIA,  in the last 168 hours CBC:  Recent Labs Lab 10/06/13 0001 10/06/13 0546 10/07/13 0320 10/08/13 0415 10/09/13 0445 10/10/13 0409  WBC 6.1 4.8 8.6 4.9 3.9* 4.4  NEUTROABS 5.1 3.9  --   --   --   --   HGB 8.3* 7.4* 9.1* 7.8* 7.5* 7.9*  HCT 27.6* 24.7* 31.5* 25.8* 24.8* 26.9*  MCV 92.0 92.9 93.8 90.2 92.2 91.8  PLT 232 198 241 208 192 214   Cardiac Enzymes:  Recent Labs Lab 10/06/13 2249 10/07/13 0320 10/07/13 1049  TROPONINI <0.30 <0.30 <0.30   BNP (last 3 results)  Recent Labs  05/04/13 0155 08/22/13 0959 10/07/13 0030  PROBNP 484.8* 4028.0* 3141.0*   CBG:  Recent Labs Lab 10/08/13 0415 10/08/13 0743 10/08/13 1536 10/08/13 1914 10/08/13 2351  GLUCAP 106* 122* 104* 106* 91    Recent Results (from the past 240 hour(s))  CULTURE, BLOOD (ROUTINE X 2)     Status: None   Collection Time    10/05/13 11:45 PM      Result Value Ref Range Status   Specimen Description BLOOD LEFT ARM   Final   Special Requests BOTTLES DRAWN AEROBIC AND ANAEROBIC 10CC EACH   Final   Culture  Setup Time     Final   Value: 10/06/2013 08:37     [facility]   Culture     Final   Value:        BLOOD CULTURE RECEIVED NO GROWTH TO DATE CULTURE WILL BE HELD FOR 5 DAYS BEFORE ISSUING A FINAL NEGATIVE REPORT     Performed at [facility]   Report Status PENDING   Incomplete  URINE CULTURE     Status: None   Collection Time    10/06/13  12:00 AM      Result Value Ref Range Status   Specimen Description URINE, CLEAN CATCH   Final   Special Requests NONE   Final   Culture  Setup Time     Final   Value: 10/06/2013 00:30     Performed at [facility]   Colony Count     Final   Value: NO GROWTH     Performed at [facility]   Culture     Final  Value: NO GROWTH     Performed at [facility]   Report Status 10/06/2013 FINAL   Final  CULTURE, BLOOD (ROUTINE X 2)     Status: None   Collection Time    10/06/13 12:28 AM      Result Value Ref Range Status   Specimen Description BLOOD RIGHT FOREARM   Final   Special Requests BOTTLES DRAWN AEROBIC ONLY 10CC   Final   Culture  Setup Time     Final   Value: 10/06/2013 08:37     Performed at [facility]   Culture     Final   Value:        BLOOD CULTURE RECEIVED NO GROWTH TO DATE CULTURE WILL BE HELD FOR 5 DAYS BEFORE ISSUING A FINAL NEGATIVE REPORT     Performed at [facility]   Report Status PENDING   Incomplete     Studies: Dg Chest Port 1 View  10/10/2013   CLINICAL DATA:  post thoracentesis  EXAM: PORTABLE CHEST - 1 VIEW  COMPARISON:  DG CHEST 1V PORT dated 10/09/2013  FINDINGS: Cardiac silhouette is enlarged. There has been significant decreased size of the left pleural effusion consistent with thoracentesis. There is no evidence of a pneumothorax. Increased density is appreciated within the left lung base differential considerations residual effusion versus atelectasis or infiltrate. No further focal regions of consolidation nor infiltrate. 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 A right internal jugular central venous catheter tip at the level superior vena cava. Patient is status post median sternotomy. Mild degenerative changes are appreciated within the shoulders.  IMPRESSION: Improved left pleural effusion consistent with thoracentesis.  No pneumothorax  Atelectasis versus infiltrate versus residual  effusion left lung base   Electronically Signed   By: Salome Holmes M.D.   On: 10/10/2013 11:40   Dg Chest Port 1 View  10/09/2013   CLINICAL DATA:  Shortness of breath, cough, hospital acquired pneumonia  EXAM: PORTABLE CHEST - 1 VIEW  COMPARISON:  DG CHEST 1V PORT dated 10/08/2013; DG CHEST 2 VIEW dated 08/22/2013; CT ANGIO CHEST W/CM &/OR WO/CM dated 08/22/2013; DG CHEST 1V PORT dated 05/04/2013; CT ANGIO CHEST AORTA W/CM & WO/CM dated 10/06/2013  FINDINGS: Interval increase in now moderate size left-sided effusion with worsening obscuration of the right border. Otherwise, grossly unchanged cardiac silhouette and mediastinal contours post aortic arch and descending thoracic aortic stent graft repair. Post median sternotomy aortic valve replacement. Interval extubation removal of enteric tube. Interval removal of external pacing pads. Otherwise, stable position of support apparatus. Improved aeration of the right lung. Grossly unchanged bones.  IMPRESSION: 1. Interval extubation removal of enteric tube.  No pneumothorax. 2. Interval increase in now moderate size left-sided effusion with associated worsening left mid and lower lung consolidative opacities, likely atelectasis.   Electronically Signed   By: Simonne Come M.D.   On: 10/09/2013 07:54    Scheduled Meds: . amiodarone  200 mg Oral Daily  . apixaban  5 mg Oral BID  . feeding supplement (RESOURCE BREEZE)  1 Container Oral TID BM  . furosemide  40 mg Oral BID  . metoprolol  12.5 mg Oral BID  . pantoprazole  40 mg Oral Daily  . piperacillin-tazobactam (ZOSYN)  IV  3.375 g Intravenous Q8H  . potassium chloride  40 mEq Oral Daily  . tamsulosin  0.4 mg Oral BID  . vancomycin  1,250 mg Intravenous Q12H   Continuous Infusions:  Principal Problem:   UTI (urinary tract infection) Active Problems:   HTN (hypertension)   Aortic dissection   Anemia   CAD (coronary artery disease)   S/P AVR (aortic valve replacement)   Chronic systolic congestive  heart failure   Atrial fibrillation   Recurrent UTI   Urinary retention   Acute respiratory failure with hypoxia   Shock circulatory    Time spent: 35 min    Jeralyn Bennett  [facility] Pager [phone]. If 7PM-7AM, please contact night-coverage at [email or web address], password [facility] 10/10/2013, 4:01 PM  LOS: 5 days

## 2013-10-11 DIAGNOSIS — R6520 Severe sepsis without septic shock: Secondary | ICD-10-CM

## 2013-10-11 DIAGNOSIS — R6521 Severe sepsis with septic shock: Secondary | ICD-10-CM

## 2013-10-11 DIAGNOSIS — J189 Pneumonia, unspecified organism: Secondary | ICD-10-CM

## 2013-10-11 DIAGNOSIS — J984 Other disorders of lung: Secondary | ICD-10-CM | POA: Diagnosis present

## 2013-10-11 DIAGNOSIS — A419 Sepsis, unspecified organism: Secondary | ICD-10-CM

## 2013-10-11 HISTORY — DX: Pneumonia, unspecified organism: J18.9

## 2013-10-11 LAB — BASIC METABOLIC PANEL
BUN: 9 mg/dL (ref 6–23)
CALCIUM: 7.8 mg/dL — AB (ref 8.4–10.5)
CO2: 23 mEq/L (ref 19–32)
CREATININE: 0.64 mg/dL (ref 0.50–1.35)
Chloride: 108 mEq/L (ref 96–112)
Glucose, Bld: 95 mg/dL (ref 70–99)
Potassium: 3.6 mEq/L — ABNORMAL LOW (ref 3.7–5.3)
Sodium: 142 mEq/L (ref 137–147)

## 2013-10-11 LAB — CBC
HCT: 25.4 % — ABNORMAL LOW (ref 39.0–52.0)
Hemoglobin: 7.5 g/dL — ABNORMAL LOW (ref 13.0–17.0)
MCH: 27 pg (ref 26.0–34.0)
MCHC: 29.5 g/dL — ABNORMAL LOW (ref 30.0–36.0)
MCV: 91.4 fL (ref 78.0–100.0)
PLATELETS: 197 10*3/uL (ref 150–400)
RBC: 2.78 MIL/uL — ABNORMAL LOW (ref 4.22–5.81)
RDW: 17.8 % — AB (ref 11.5–15.5)
WBC: 4.5 10*3/uL (ref 4.0–10.5)

## 2013-10-11 LAB — PROCALCITONIN: PROCALCITONIN: 2.77 ng/mL

## 2013-10-11 MED ORDER — AMOXICILLIN-POT CLAVULANATE 875-125 MG PO TABS: 1.0000 | ORAL_TABLET | Freq: Two times a day (BID) | ORAL | Status: DC

## 2013-10-11 MED ORDER — METOPROLOL TARTRATE 12.5 MG HALF TABLET: 12.5000 mg | ORAL_TABLET | Freq: Two times a day (BID) | ORAL | Status: DC

## 2013-10-11 NOTE — Progress Notes (Signed)
REMOVED RT NECK CENTRAL LINE. PT TOLERATED WELL, PRESSURE HELD FOR , PT ON BEDREST FOR . APPLIED VASELINE GAUZE DRESSING.

## 2013-10-11 NOTE — Discharge Summary (Signed)
Physician Discharge Summary  NYCHOLAS RAYNER [name]:[id number] DOB: 1941/04/17 DOA: 10/05/2013  PCP: [name]., MD  Admit date: 10/05/2013 Discharge date: 10/11/2013  Time spent: 35 minutes  Recommendations for Outpatient Follow-up:  1. Patient undergoing thoracentesis on 10/10/13, Cytology and Cultures are pending. Please follow up studies 2. Follow up on blood pressures since blood pressure medications where changed during this hospitalization  Discharge Diagnoses:  Principal Problem:   Acute respiratory failure with hypoxia Active Problems:   Pleural effusion   Shock circulatory   HCAP (healthcare-associated pneumonia)   S/P AVR (aortic valve replacement)   HTN (hypertension)   Aortic dissection   Anemia   CAD (coronary artery disease)   Chronic systolic congestive heart failure   Atrial fibrillation   UTI (urinary tract infection)   Recurrent UTI   Urinary retention   Discharge Condition: Stable  Diet recommendation: Heart Healthy  Filed Weights   10/09/13 0500 10/10/13 0607 10/11/13 0500  Weight: 86.2 kg (190 lb 0.6 oz) 85.186 kg (187 lb 12.8 oz) 85.2 kg (187 lb 13.3 oz)    History of present illness:  [name] [name] is a 73 y.o. male with Past medical history of coronary artery disease, DVT, hypertension, aortic valve replacement, aortic dissection repair, dyslipidemia, PVD.  The patient presented with complaints of burning urination ongoing since last Thursday. The patient was recently admitted in the hospital for vascular procedure and at the time of the discharge he had a Foley catheter because of urinary retention. The catheter was changed 2 more times in his urologist office. Patient was given a cephalosporin 3 times a day after removal of the Foley catheter since he complained of burning urination. He was also given pyridium.  Today the patient started having chills with fever again and therefore he came to the hospital. No nausea no vomiting no  abdominal pain no CVA tenderness. No recent change in his medications.   Hospital Course:  Patient is a pleasant 73 year old gentleman with a past medical history of aortic dissection status post mesh stent repair and aortic valve replacement, paroxysmal atrial fibrillationtery disease, who was admitted on 10/06/2013 1 a presented with complaints of fever and dysuria. He was admitted to the medicine service initially. On 10/07/2013 patient going into respiratory distress. Chest x-ray showed left lower lobe pneumonia. CT scan of lungs with IV contrast was negative for pulmonary embolism, patient previously on anticoagulation but had been held due to 2 hemoglobin dropped. Given decompensation he was transferred to the intensive care unit as pulmonary critical care medicine took over his care. He was treated with vancomycin and Zosyn. Patient condition continued to deteriorate as he underwent a rapid sequence intubation on 10/07/2013. Patient also required IV pressors for septic shock. Followup x-ray showed interval increase in moderate size left-sided pleural effusion with associated worsening of left mid and lower lung consolidative opacities. Her suspicion that pleural effusion could reflect a pneumonic effusion. IV antibiotics were continued. Patient was successfully extubated on 10/08/2013. Other issues occurring in the intensive care unit, patient developing short lasting episodes of ventricular tachycardia as EKG showed a prolonged QTC of 794. Patient was seen by cardiology. Nonsustained ventricular tachycardia (4 slides) was felt to be secondary to critical illness in setting of septic shock. Cardiology recommending continuing amiodarone and metoprolol. Given clinical improvement he was transferred to the medical floor on 10/09/2013. On 10/10/2013 he underwent paracentesis of left sided large pleural effusion, with removal of 1250 MLS of pleural fluid. Patient tolerated procedure well there  are no immediate  complications. Pleural fluid analysis indicates agitative effusion however cytology and cultures are pending at the time of this dictation and will need to be followed up as an outpatient. Patient was discharged in stable condition on 10/11/2013 to his home.  4/19 Admitted for VDRF with shock requiring pressor support  4/20 CTA: thoracic aortic stent graft with exclusion of the previously seen aortic dissection. No evidence of endoleak.  Stable right upper lobe pulmonary nodule. Left lower lobe consolidation with associated effusion. Patent left common carotid artery to left subclavian artery bypass with some retrograde filling of the more proximal left subclavian artery. 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  4/22 Extubated successfully  4/23 CXR with moderate size left-sided effusion with associated worsening left mid and lower lung consolidative opacities  LINES / TUBES:  ETT 10/07/13>>4/22  Central line 4/21>>4/22  CULTURES:  Blood 4/19 >>>  Urine 4/20 >>>NG  ANTIBIOTICS:  Cefepime 4/19 > 4/20  Vanc 4/19 >>>  Zosyn 4/20 >>>   Discharge Exam: Filed Vitals:   10/11/13 0500  BP: 133/70  Pulse: 71  Temp: 99.1 F (37.3 C)  Resp: 17    General: Patient is in no acute distress he is sitting at bedside chair, reports improvement to shortness of breath, he states feeling well and is anxious about going home today. Cardiovascular: Regular rate rhythm normal S1-S2,  Respiratory: Left-sided crackles noted, normal respiratory effort, breathing comfortably on room air Abdomen: Soft nontender nondistended  Discharge Instructions You were cared for by a hospitalist during your hospital stay. If you have any questions about your discharge medications or the care you received while you were in the hospital after you are discharged, you can call the unit and asked to speak with the hospitalist on call if the hospitalist that took care of you is not  available. Once you are discharged, your primary care physician will handle any further medical issues. Please note that NO REFILLS for any discharge medications will be authorized once you are discharged, as it is imperative that you return to your primary care physician (or establish a relationship with a primary care physician if you do not have one) for your aftercare needs so that they can reassess your need for medications and monitor your lab values.  Discharge Orders   Future Appointments Provider Department Dept Phone   12/02/2013 10:15 AM [name], MD [facility] [address] [phone]   Future Orders Complete By Expires   (HEART FAILURE PATIENTS) Call MD:  Anytime you have any of the following symptoms: 1) 3 pound weight gain in 24 hours or 5 pounds in 1 week 2) shortness of breath, with or without a dry hacking cough 3) swelling in the hands, feet or stomach 4) if you have to sleep on extra pillows at night in order to breathe.  As directed    Amylase, Pleural Fluid  As directed 10/09/2014   Body fluid cell count with differential  As directed 10/09/2014   Body fluid chemistry - Cholesterol  As directed 10/09/2014   Questions:     Tests needed:  Cholesterol   Fluid Type:  pleural fluid   Body fluid chemistry - Triglycerides  As directed 10/09/2014   Questions:     Tests needed:  Triglycerides   Fluid Type:  pleural fluid   Body fluid culture with gram stain  As directed 10/09/2014   Call MD for:  extreme fatigue  As directed    Call MD for:  persistant dizziness or light-headedness  As directed    Call MD for:  persistant nausea and vomiting  As directed    Call MD for:  severe uncontrolled pain  As directed    Call MD for:  temperature >100.4  As directed    Cytology - Non PAP;  As directed 10/09/2014   Questions:     Specimen Source:  pleural fluid   Specimen Location:  pleural fluid   Previous Biopsy:     Copy of Report to:     Clinical History:     Clinical  Diagnosis:     Cytology Non Gyn Anc Testing:     Diet - low sodium heart healthy  As directed    Glucose, Pleural fluid  As directed 10/09/2014   Questions:     Fluid Type:  pleural fluid   Increase activity slowly  As directed    Lactate dehydrogenase, Pleural fluid  As directed 10/09/2014   Questions:     Fluid Type:  pleural fluid   pH, body fluid  As directed 10/09/2014   Questions:     Fluid Type:     Protein, Pleural fluid  As directed 10/09/2014   Questions:     Fluid Type:  pleural fluid       Medication List    STOP taking these medications       aspirin EC 81 MG tablet     cephALEXin 500 MG capsule  Commonly known as:  KEFLEX     enalapril 10 MG tablet  Commonly known as:  VASOTEC     hydrALAZINE 25 MG tablet  Commonly known as:  APRESOLINE      TAKE these medications       amiodarone 200 MG tablet  Commonly known as:  PACERONE  Take 1 tablet (200 mg total) by mouth daily.     amoxicillin-clavulanate 875-125 MG per tablet  Commonly known as:  AUGMENTIN  Take 1 tablet by mouth 2 (two) times daily.     atorvastatin 10 MG tablet  Commonly known as:  LIPITOR  Take 1 tablet (10 mg total) by mouth daily.     ELIQUIS 5 MG Tabs tablet  Generic drug:  apixaban  Take 5 mg by mouth 2 (two) times daily.     furosemide 40 MG tablet  Commonly known as:  LASIX  Take 1 tablet (40 mg total) by mouth 2 (two) times daily.     lansoprazole 30 MG capsule  Commonly known as:  PREVACID  Take 30 mg by mouth daily.     loratadine 10 MG tablet  Commonly known as:  CLARITIN  Take 10 mg by mouth daily.     metoprolol tartrate 12.5 mg Tabs tablet  Commonly known as:  LOPRESSOR  Take 0.5 tablets (12.5 mg total) by mouth 2 (two) times daily.     multivitamin with minerals Tabs tablet  Take 1 tablet by mouth daily.     nitroGLYCERIN 0.4 MG SL tablet  Commonly known as:  NITROSTAT  Place 0.4 mg under the tongue every 5 (five) minutes as needed.     phenazopyridine 100  MG tablet  Commonly known as:  PYRIDIUM  Take 100 mg by mouth 2 (two) times daily as needed for pain.     potassium chloride SA 20 MEQ tablet  Commonly known as:  K-DUR,KLOR-CON  Take 2 tablets (40 mEq total) by mouth daily.  tamsulosin 0.4 MG Caps capsule  Commonly known as:  FLOMAX  Take 0.4 mg by mouth 2 (two) times daily.     zolpidem 5 MG tablet  Commonly known as:  AMBIEN  Take 1 tablet (5 mg total) by mouth at bedtime as needed for sleep.       Allergies  Allergen Reactions  . Lortab [Hydrocodone-Acetaminophen] Hives  . Morphine And Related Hives       Follow-up Information   Follow up with Corky CraftsVARANASI,JAYADEEP S., MD In 1 week.   Specialty:  Interventional Cardiology   Contact information:   [address] [place] [address] [phone]       Follow up with Nelda BucksFEINSTEIN,DANIEL J., MD In 1 week.   Specialty:  Pulmonary Disease   Contact information:   [address] [place] [address] [phone]       Follow up with GERHARDT,EDWARD B, MD In 2 weeks.   Specialty:  Cardiothoracic Surgery   Contact information:   [address] [place] [address] [phone]        The results of significant diagnostics from this hospitalization (including imaging, microbiology, ancillary and laboratory) are listed below for reference.    Significant Diagnostic Studies: Dg Chest 2 View  10/06/2013   CLINICAL DATA:  Fever of 1020.2.  Recent urinary tract infection.  EXAM: CHEST  2 VIEW  COMPARISON:  DG CHEST 1V PORT dated 08/28/2013; CT ANGIO CHEST W/CM &/OR WO/CM dated 08/22/2013; DG CHEST 1V PORT dated 08/26/2013  FINDINGS: Postoperative changes in the mediastinum with aortic valve replacement and aortic stent graft. Aortic shadow remains enlarged but appears similar to previous studies. Left pleural effusion with basilar atelectasis is similar to prior study. Right lung is clear. Cardiac enlargement with normal pulmonary vascularity.   IMPRESSION: Stable appearance of postoperative changes in the mediastinum. Cardiac enlargement. Left pleural effusion with basilar atelectasis. These changes appear stable since previous study appear   Electronically Signed   By: Burman NievesWilliam  Stevens M.D.   On: 10/06/2013 00:44   Ct Abdomen Pelvis W Contrast  10/06/2013   CLINICAL DATA:  pt with anemia, no external bleed. rule out retroperitoneal bleed, also has recurrent UTi evaluate obstruction  EXAM: CT ABDOMEN AND PELVIS WITH CONTRAST  TECHNIQUE: Multidetector CT imaging of the abdomen and pelvis was performed using the standard protocol following bolus administration of intravenous contrast.  CONTRAST:  100mL OMNIPAQUE IOHEXOL 300 MG/ML  SOLN  COMPARISON:  CT CTA ABD/PEL W/CM AND/OR W/O CM dated 08/22/2013; DG CHEST 2 VIEW dated 10/06/2013; CT ANGIO CHEST W/CM &/OR WO/CM dated 05/18/2011; CT ANGIO CHEST W/CM &/OR WO/CM dated 05/30/2012  FINDINGS: The patient is status post aortic stent graft repair. The graft is only partially visualized. Along the superior aspect of the descending aorta a crescent-shaped area of of low attenuation (Hounsfield units 29) is appreciated adjacent to the aorta likely representing thrombus within the false lumen of the dissection. There is no evidence of active extravasation within the visualized portions of the stented aorta. The thoracic aorta is incompletely evaluated on this study.  A small left pleural effusion is appreciated increased from prior. There has been near complete resolution of the previous left pleural effusion. Areas of increased density are appreciated within the lung bases left greater than right differential considerations are atelectasis versus infiltrates.  Atherosclerotic calcification identified within the coronary vessels.  The liver, spleen, adrenals, pancreas, and left kidney are unremarkable. A benign Bosniak type 1 cyst  is appreciated within the upper pole of the right kidney. A stable 1.3 cm nonenhancing  cystic appearing nodule is appreciated along the anterior lateral lower pole right kidney, stable. There is mild stranding surrounding this finding which may represent prior rupture of a cyst.  There is no evidence of abdominal free fluid, loculated fluid collections, masses, nor adenopathy.  Bowel is negative. There is no evidence of a retroperitoneal hematoma.  There is no evidence of abdominal aortic aneurysm. The celiac, SMA, IMA, portal vein are patent. Atherosclerotic calcifications are appreciated within the abdominal aorta.  Small calcified gallstones appreciated within the dependent portion of the gallbladder.  A trace amount of fluid is appreciated within the dependent portion of the lower pelvis. No pelvic masses, loculated fluid collections nor adenopathy is appreciated. There is diverticulosis within the sigmoid colon.  A small fat containing umbilical hernia is identified. There is no evidence of an inguinal hernia.  Multilevel spondylosis within the lumbar spine. No aggressive appearing osseous lesions.  IMPRESSION: 1. Patient is status post aortic stent graft repair. The stent graft is incompletely visualized. There are findings likely reflecting thrombus within the false lumen. Active extravasation is not appreciated. Leakage within non evaluated portions of the graft cannot be excluded on this study. Considering the patient's history further evaluation of the entire thoracic aorta and recommended with CT angiography. These results were called by telephone at the time of interpretation on 10/06/2013 at 11:43 AM to Dr. Edsel Petrin , who verbally acknowledged these results. 2. No evidence of focal or acute intra-abdominal abnormalities. A trace amount of fluid is appreciated within the pelvis this may represent reactive fluid considering the patient's history of recent surgical intervention. 3. Stable benign Bosniak type 1 cyst right kidney. Indeterminate stable low attenuating focus inferior pole  right kidney which may represent a partially ruptured cyst. 4. Increased size of the patient's left pleural effusion with near complete resolution of the right. 5. Atelectasis versus infiltrate within the lung bases.   Electronically Signed   By: Salome Holmes M.D.   On: 10/06/2013 11:49   Dg Chest Port 1 View  10/10/2013   CLINICAL DATA:  post thoracentesis  EXAM: PORTABLE CHEST - 1 VIEW  COMPARISON:  DG CHEST 1V PORT dated 10/09/2013  FINDINGS: Cardiac silhouette is enlarged. There has been significant decreased size of the left pleural effusion consistent with thoracentesis. There is no evidence of a pneumothorax. Increased density is appreciated within the left lung base differential considerations residual effusion versus atelectasis or infiltrate. No further focal regions of consolidation nor infiltrate. 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 A right internal jugular central venous catheter tip at the level superior vena cava. Patient is status post median sternotomy. Mild degenerative changes are appreciated within the shoulders.  IMPRESSION: Improved left pleural effusion consistent with thoracentesis.  No pneumothorax  Atelectasis versus infiltrate versus residual effusion left lung base   Electronically Signed   By: Salome Holmes M.D.   On: 10/10/2013 11:40   Dg Chest Port 1 View  10/09/2013   CLINICAL DATA:  Shortness of breath, cough, hospital acquired pneumonia  EXAM: PORTABLE CHEST - 1 VIEW  COMPARISON:  DG CHEST 1V PORT dated 10/08/2013; DG CHEST 2 VIEW dated 08/22/2013; CT ANGIO CHEST W/CM &/OR WO/CM dated 08/22/2013; DG CHEST 1V PORT dated 05/04/2013; CT ANGIO CHEST AORTA W/CM & WO/CM dated 10/06/2013  FINDINGS: Interval increase in now moderate size left-sided effusion with worsening obscuration of the right border.  Otherwise, grossly unchanged cardiac silhouette and mediastinal contours post aortic arch and descending thoracic aortic stent graft repair. Post median  sternotomy aortic valve replacement. Interval extubation removal of enteric tube. Interval removal of external pacing pads. Otherwise, stable position of support apparatus. Improved aeration of the right lung. Grossly unchanged bones.  IMPRESSION: 1. Interval extubation removal of enteric tube.  No pneumothorax. 2. Interval increase in now moderate size left-sided effusion with associated worsening left mid and lower lung consolidative opacities, likely atelectasis.   Electronically Signed   By: Simonne Come M.D.   On: 10/09/2013 07:54   Dg Chest Port 1 View  10/08/2013   CLINICAL DATA:  ET tube check  EXAM: PORTABLE CHEST - 1 VIEW  COMPARISON:  DG CHEST 1V PORT dated 10/07/2013; CT ANGIO CHEST W/CM &/OR WO/CM dated 08/22/2013; DG CHEST 1V PORT dated 05/04/2013; DG CHEST 1V PORT dated 05/12/2013; DG CHEST 2 VIEW dated 05/09/2013  FINDINGS: There is an endotracheal tube with the tip 7.5 cm above the carina. There is a nasogastric tube coursing below the diaphragm. There is a right jugular central venous catheter with the tip projecting over the SVC.  There is bilateral diffuse interstitial thickening. There is a small left pleural effusion and left basilar airspace disease likely representing atelectasis. There is hazy airspace disease adjacent to the aortic arch in the left lung which may reflect atelectasis versus false lumen from the aortic dissection. . There is no pneumothorax. There is an aortic stent graft noted. Prior median sternotomy. Prosthetic aortic valve is noted.  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  IMPRESSION: 1. Endotracheal tube 7.5 cm above the carina. 2. Stable pulmonary edema.  Stable cardiomegaly.   Electronically Signed   By: Elige Ko   On: 10/08/2013 07:45   Dg Chest Port 1 View  10/07/2013   CLINICAL DATA:  Intubated  EXAM: PORTABLE CHEST - 1 VIEW  COMPARISON:  DG CHEST 1V PORT dated 10/06/2013  FINDINGS: Endotracheal tube placed. Tip is at the carina. NG tube  placed. Tip is in the fundus. Central hazy airspace disease has developed worrisome for developing edema. Left pleural effusion has increased. Cardiomegaly. Postoperative changes are stable. Right internal jugular central venous catheter placed with its tip in the upper SVC. No pneumothorax.  IMPRESSION: Endotracheal tube placed.  Tip is at the carina.  NG tube placed  Right internal jugular central venous catheter placed with its tip at the upper SVC and no pneumothorax  Developing airspace disease worrisome for edema or ARDS.   Electronically Signed   By: Maryclare Bean M.D.   On: 10/07/2013 01:59   Dg Chest Port 1 View  10/06/2013   CLINICAL DATA:  Cough and respiratory distress.  EXAM: PORTABLE CHEST - 1 VIEW  COMPARISON:  CT ANGIO CHEST AORTA W/CM & WO/CM dated 10/06/2013; DG CHEST 2 VIEW dated 10/06/2013  FINDINGS: No significant change since previous study. Previous postoperative changes in the mediastinum with aortic stent graft placement. Mild cardiac enlargement. Mild pulmonary vascular congestion. Small left pleural effusion with basilar atelectasis.  IMPRESSION: No significant change since prior study.   Electronically Signed   By: Burman Nieves M.D.   On: 10/06/2013 23:10   Ct Angio Chest Aortic Dissect W &/or W/o  10/06/2013   CLINICAL DATA:  Recent drop in hemoglobin, history of thoracic aortic stent graft  EXAM: CT ANGIOGRAPHY CHEST WITH CONTRAST  TECHNIQUE: Multidetector CT imaging of the chest was performed using the standard protocol during  bolus administration of intravenous contrast. Multiplanar CT image reconstructions and MIPs were obtained to evaluate the vascular anatomy.  CONTRAST:  80mL OMNIPAQUE IOHEXOL 350 MG/ML SOLN  COMPARISON:  08/22/2013  FINDINGS: A left-sided pleural effusion is seen which is increased from the prior exam. Left lower lobe consolidation secondary to the effusion is seen. No significant right-sided infiltrate is noted. 7 mm nodule is again seen in the anterior  aspect of the right upper lobe best seen on image number 43. This is stable from the prior exam.  There are changes consistent with recent aortic stent graft placement. The stent is widely patent and no findings to suggest endoleak are identified. The previously seen aortic dissection has been excluded. The thrombus surrounding the stent graft in the descending thoracic aorta is slightly greater in density than the adjacent fluid although felt to be within normal limits given the relative recent surgical history. There are no findings to suggest active extravasation. The more distal thoracic aorta and proximal visualized abdominal aorta are within normal limits. Few small mediastinal lymph nodes are seen which are stable from the prior exam. 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 Some retrograde flow in the proximal portion of the subclavian artery is noted secondary to a carotid subclavian bypass graft which appears patent.  No evidence of pulmonary emboli is identified. The visualized upper abdomen this similar to that seen on recent CT of the abdomen and pelvis.  Review of the MIP images confirms the above findings.  IMPRESSION: Changes of thoracic aortic stent graft with exclusion of the previously seen aortic dissection. No evidence to suggest endoleak is identified.  Stable right upper lobe pulmonary nodule.  Left lower lobe consolidation with associated effusion.  Patent left common carotid artery to left subclavian artery bypass with some retrograde filling of the more proximal left subclavian artery. 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   Electronically Signed   By: Alcide Clever M.D.   On: 10/06/2013 16:58    Microbiology: Recent Results (from the past 240 hour(s))  CULTURE, BLOOD (ROUTINE X 2)     Status: None   Collection Time    10/05/13 11:45 PM      Result  Value Ref Range Status   Specimen Description BLOOD LEFT ARM   Final   Special Requests BOTTLES DRAWN AEROBIC AND ANAEROBIC 10CC EACH   Final   Culture  Setup Time     Final   Value: 10/06/2013 08:37     Performed at [facility]   Culture     Final   Value:        BLOOD CULTURE RECEIVED NO GROWTH TO DATE CULTURE WILL BE HELD FOR 5 DAYS BEFORE ISSUING A FINAL NEGATIVE REPORT     Performed at [facility]   Report Status PENDING   Incomplete  URINE CULTURE     Status: None   Collection Time    10/06/13 12:00 AM      Result Value Ref Range Status   Specimen Description URINE, CLEAN CATCH   Final   Special Requests NONE   Final   Culture  Setup Time     Final   Value: 10/06/2013 00:30     Performed at [facility]     Final   Value: NO GROWTH  Performed at [facility]     Final   Value: NO GROWTH     Performed at [facility]   Report Status 10/06/2013 FINAL   Final  CULTURE, BLOOD (ROUTINE X 2)     Status: None   Collection Time    10/06/13 12:28 AM      Result Value Ref Range Status   Specimen Description BLOOD RIGHT FOREARM   Final   Special Requests BOTTLES DRAWN AEROBIC ONLY 10CC   Final   Culture  Setup Time     Final   Value: 10/06/2013 08:37     Performed at [facility]   Culture     Final   Value:        BLOOD CULTURE RECEIVED NO GROWTH TO DATE CULTURE WILL BE HELD FOR 5 DAYS BEFORE ISSUING A FINAL NEGATIVE REPORT     Performed at [facility]   Report Status PENDING   Incomplete  BODY FLUID CULTURE     Status: None   Collection Time    10/10/13 10:56 AM      Result Value Ref Range Status   Specimen Description FLUID LEFT PLEURAL   Final   Special Requests NONE   Final   Gram Stain     Final   Value: RARE WBC PRESENT, PREDOMINANTLY PMN     NO ORGANISMS SEEN     Performed at [facility]   Culture PENDING   Incomplete   Report Status PENDING   Incomplete      Labs: Basic Metabolic Panel:  Recent Labs Lab 10/07/13 0230  10/07/13 1625 10/08/13 0415 10/08/13 1700 10/09/13 0445 10/10/13 0409 10/11/13 0445  NA  --   < > 139 140 142 141 140 142  K  --   < > 4.2 3.3* 4.1 3.9 4.0 3.6*  CL  --   < > 106 109 109 109 108 108  CO2  --   < > 20 19 22 22 22 23   GLUCOSE  --   < > 118* 108* 104* 84 95 95  BUN  --   < > 20 19 17 14 12 9   CREATININE  --   < > 0.72 0.62 0.59 0.57 0.59 0.64  CALCIUM  --   < > 7.4* 7.1* 7.4* 7.8* 8.1* 7.8*  MG 1.8  --  2.4 2.7*  --  2.4 2.2  --   PHOS 1.6*  --   --  2.1*  --  3.0 2.5  --   < > = values in this interval not displayed. Liver Function Tests:  Recent Labs Lab 10/06/13 0001 10/06/13 0546 10/08/13 0415 10/10/13 1220  AST 79* 65* 95*  --   ALT 77* 66* 87*  --   ALKPHOS 90 86 87  --   BILITOT 0.6 0.4 0.5  --   PROT 6.4 5.8* 5.1* 5.9*  ALBUMIN 2.9* 2.6* 2.1*  --    No results found for this basename: LIPASE, AMYLASE,  in the last 168 hours No results found for this basename: AMMONIA,  in the last 168 hours CBC:  Recent Labs Lab 10/06/13 0001 10/06/13 0546 10/07/13 0320 10/08/13 0415 10/09/13 0445 10/10/13 0409 10/11/13 0445  WBC 6.1 4.8 8.6 4.9 3.9* 4.4 4.5  NEUTROABS 5.1 3.9  --   --   --   --   --   HGB 8.3* 7.4* 9.1* 7.8* 7.5* 7.9* 7.5*  HCT 27.6* 24.7*  31.5* 25.8* 24.8* 26.9* 25.4*  MCV 92.0 92.9 93.8 90.2 92.2 91.8 91.4  PLT 232 198 241 208 192 214 197   Cardiac Enzymes:  Recent Labs Lab 10/06/13 2249 10/07/13 0320 10/07/13 1049  TROPONINI <0.30 <0.30 <0.30   BNP: BNP (last 3 results)  Recent Labs  05/04/13 0155 08/22/13 0959 10/07/13 0030  PROBNP 484.8* 4028.0* 3141.0*   CBG:  Recent Labs Lab 10/08/13 0415 10/08/13 0743 10/08/13 1536 10/08/13 1914 10/08/13 2351  GLUCAP 106* 122* 104* 106* 91       Signed:  Dacota Ruben  [facility] 10/11/2013, 10:51 AM

## 2013-10-11 NOTE — Progress Notes (Signed)
Subjective:  He feels a lot better after the thoracentesis yesterday and wants to go home. Not currently short of breath. No chest pain  Objective:  Vital Signs in the last 24 hours: BP 133/70  Pulse 71  Temp(Src) 99.1 F (37.3 C) (Oral)  Resp 17  Ht 5\' 10"  (1.778 m)  Wt 85.2 kg (187 lb 13.3 oz)  BMI 26.95 kg/m2  SpO2 95%  Physical Exam: Pleasant male in no acute distress Lungs: Clear anteriorly,reduced breath sounds at left base Cardiac:  Regular rhythm, normal S1 and S2, no S3, valve clicks well heard, 1/6 systolic murmur Extremities:  No edema present  Intake/Output from previous day: 04/24 0701 - 04/25 0700 In: -  Out: 1150 [Urine:1150] Weight Filed Weights   10/09/13 0500 10/10/13 0607 10/11/13 0500  Weight: 86.2 kg (190 lb 0.6 oz) 85.186 kg (187 lb 12.8 oz) 85.2 kg (187 lb 13.3 oz)    Lab Results: Basic Metabolic Panel:  Recent Labs  35/24/81 0409 10/11/13 0445  NA 140 142  K 4.0 3.6*  CL 108 108  CO2 22 23  GLUCOSE 95 95  BUN 12 9  CREATININE 0.59 0.64    CBC:  Recent Labs  10/10/13 0409 10/11/13 0445  WBC 4.4 4.5  HGB 7.9* 7.5*  HCT 26.9* 25.4*  MCV 91.8 91.4  PLT 214 197    BNP    Component Value Date/Time   PROBNP 3141.0* 10/07/2013 0030   Telemetry: Sinus rhythm, QTC 0.5 to .52 Assessment/Plan:  1. Nonsustained ventricular tachycardia much better on amiodarone 2. Bradycardia improved with reduction metoprolol 3. 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 History of prolonged QT interval in the setting of nonsustained ventricular tachycardia 5. Significant anemia currently stable  Recommendations:  From a cardiac viewpoint is acceptable for discharge. He should continue on furosemide twice daily and a lower dose of metoprolol at home. If he develops low blood pressure at home I would stop his Flomax. He should continue Eliquus and amiodarone 200 mg daily. He should follow up with his cardiologist in  one to 2 weeks after discharge.     Darden Palmer  MD [facility]  10/11/2013, 10:25 AM

## 2013-10-11 NOTE — Progress Notes (Signed)
Pt discharge home with wife. Discharge instruction reviewed and prescriptions given. Encouraged pt to call for any needs.

## 2013-10-11 NOTE — Progress Notes (Signed)
Thoracentesis results reviewed.  Fluid is a transudate by LDH and TP, and stains negative so far.  2000 white cells seen with nonspecific diff.  Cytology and cultures are pending. Glucose ordered, but do not see where resulted.  Will f/u further on Monday.  Please call if questions.

## 2013-10-12 LAB — CULTURE, BLOOD (ROUTINE X 2)
CULTURE: NO GROWTH
Culture: NO GROWTH

## 2013-10-13 LAB — ADENOSINE DEAMINASE, FLUID: ADENOSINE DEAMINASE FL: 12.8 U/L — AB (ref <7.6)

## 2013-10-13 NOTE — Progress Notes (Signed)
I agree with the above  Wells Everett Ehrler 

## 2013-10-14 LAB — BODY FLUID CULTURE: Culture: NO GROWTH

## 2013-10-20 LAB — RHEUMATOID FACTORS, FLUID: Cortisol #1 (Base): ABNORMAL

## 2013-10-21 ENCOUNTER — Encounter: Payer: Self-pay | Admitting: Nurse Practitioner

## 2013-10-21 ENCOUNTER — Ambulatory Visit
Admission: RE | Admit: 2013-10-21 | Discharge: 2013-10-21 | Disposition: A | Discharge status: Home / Self Care | Payer: Medicare Other | Source: Ambulatory Visit | Attending: Nurse Practitioner | Admitting: Nurse Practitioner

## 2013-10-21 ENCOUNTER — Ambulatory Visit (INDEPENDENT_AMBULATORY_CARE_PROVIDER_SITE_OTHER): Payer: Medicare Other | Admitting: Nurse Practitioner

## 2013-10-21 VITALS — BP 132/60 | HR 71 | Ht 70.5 in | Wt 165.8 lb

## 2013-10-21 DIAGNOSIS — I259 Chronic ischemic heart disease, unspecified: Secondary | ICD-10-CM

## 2013-10-21 DIAGNOSIS — I4891 Unspecified atrial fibrillation: Secondary | ICD-10-CM

## 2013-10-21 DIAGNOSIS — I5022 Chronic systolic (congestive) heart failure: Secondary | ICD-10-CM

## 2013-10-21 DIAGNOSIS — I10 Essential (primary) hypertension: Secondary | ICD-10-CM

## 2013-10-21 DIAGNOSIS — I509 Heart failure, unspecified: Secondary | ICD-10-CM

## 2013-10-21 DIAGNOSIS — J90 Pleural effusion, not elsewhere classified: Secondary | ICD-10-CM

## 2013-10-21 LAB — BASIC METABOLIC PANEL
BUN: 15 mg/dL (ref 6–23)
CO2: 30 mEq/L (ref 19–32)
Calcium: 8.8 mg/dL (ref 8.4–10.5)
Chloride: 102 mEq/L (ref 96–112)
Creatinine, Ser: 0.9 mg/dL (ref 0.4–1.5)
GFR: 93.64 mL/min (ref >60.00)
Glucose, Bld: 105 mg/dL — ABNORMAL HIGH (ref 70–99)
Potassium: 4.8 mEq/L (ref 3.5–5.1)
Sodium: 140 mEq/L (ref 135–145)

## 2013-10-21 LAB — CBC
HCT: 31.4 % — ABNORMAL LOW (ref 39.0–52.0)
Hemoglobin: 9.8 g/dL — ABNORMAL LOW (ref 13.0–17.0)
MCHC: 31.3 g/dL (ref 30.0–36.0)
MCV: 88.2 fl (ref 78.0–100.0)
Platelets: 335 10*3/uL (ref 150.0–400.0)
RBC: 3.56 Mil/uL — ABNORMAL LOW (ref 4.22–5.81)
RDW: 20.5 % — ABNORMAL HIGH (ref 11.5–15.5)
WBC: 5.9 10*3/uL (ref 4.0–10.5)

## 2013-10-21 LAB — MAGNESIUM: Magnesium: 2 mg/dL (ref 1.5–2.5)

## 2013-10-21 NOTE — Progress Notes (Signed)
[name] [name] Date of Birth: 1941/06/16 Medical Record #[id number]  History of Present Illness: [name] [name] is seen back today for a post hospital visit. Seen for Dr. Eldridge DaceVaranasi. He is a 73 year old male with known CAD with past MI and stent in February of 2012, DVT, HLD, PAF and HTN. In September of 2010 he presented with a large ascending aneurysm and acute aortic insufficiency and aortic arch aneurysm and at that time underwent aortic valve replacement replacement of aortic root and ascending aorta and replacement of aortic arch to the left subclavian arteryaortic dissection with AVR. He is on chronic anticoagulation.  In November 2014 he had an acute type III aortic dissection and was treated medically. Prior to his followup scan for the type III aortic dissection he was readmitted with signs and symptoms of congestive heart failure and followup scan showed significant enlargement of the type III aortic dissection and in March of 2015 he underwent left subclavian to carotid bypass ligation of the proximal left subclavian artery an endovascular repair of descending thoracic aorta with coverage of the left subclavian artery. He has a normal EF per echo from March of 2015 but with grade 3 diastolic dysfunction.   Most recently admitted with burning upon urination that developed into fever and chills. This turned into respiratory failure with pneumonia - was intubated and had VDRF - required pressors for septic shock - had a left effusion which was tapped - developed VT on the monitor with prolonged QT - continued on amiodarone and metoprolol. Discharged on 10/11/13. Hemoglobin at discharge was 7.5.   Comes in today. Here with his wife. She gives a lot of the history. She feels he is making progress and getting a little stronger. Some cough - clear sputum. No fever or chills. Got his foley out and has had no problems voiding. Denies being short of breath. No chest pain. No palpitations. BP running in  the 130's at home. No repeat CXR yet. Appetite still poor. Bowels working ok. Stools look normal. No active bleeding noted. She has him on a multi vitamin.   Current Outpatient Prescriptions  Medication Sig Dispense Refill  . amiodarone (PACERONE) 200 MG tablet Take 1 tablet (200 mg total) by mouth daily.  30 tablet  3  . apixaban (ELIQUIS) 5 MG TABS tablet Take 5 mg by mouth 2 (two) times daily.      Marland Kitchen. atorvastatin (LIPITOR) 10 MG tablet Take 1 tablet (10 mg total) by mouth daily.  90 tablet  3  . furosemide (LASIX) 40 MG tablet Take 1 tablet (40 mg total) by mouth 2 (two) times daily.  60 tablet  11  . lansoprazole (PREVACID) 30 MG capsule Take 30 mg by mouth daily.       Marland Kitchen. loratadine (CLARITIN) 10 MG tablet Take 10 mg by mouth daily.       . metoprolol tartrate (LOPRESSOR) 12.5 mg TABS tablet Take 0.5 tablets (12.5 mg total) by mouth 2 (two) times daily.  60 tablet  1  . Multiple Vitamin (MULTIVITAMIN WITH MINERALS) TABS tablet Take 1 tablet by mouth daily.      . nitroGLYCERIN (NITROSTAT) 0.4 MG SL tablet Place 0.4 mg under the tongue every 5 (five) minutes as needed.        . potassium chloride SA (K-DUR,KLOR-CON) 20 MEQ tablet Take 2 tablets (40 mEq total) by mouth daily.  60 tablet  11  . tamsulosin (FLOMAX) 0.4 MG CAPS capsule Take 0.4 mg by mouth  2 (two) times daily.        No current facility-administered medications for this visit.    Allergies  Allergen Reactions  . Lortab [Hydrocodone-Acetaminophen] Hives  . Morphine And Related Hives    Past Medical History  Diagnosis Date  . Hyperlipidemia   . DVT (deep venous thrombosis)     post knee surgery  . Tobacco abuse   . HTN (hypertension)   . Descending aortic aneurysm   . Aortic valve insufficiency   . Thoracic aneurysm   . S/P AVR (aortic valve replacement)   . CAD (coronary artery disease)   . Atrial flutter   . CHF (congestive heart failure)     Past Surgical History  Procedure Laterality Date  . Right groin  lymphocele  [id number]  . Aortic valve replacement  [id number]  . Replacement total knee      bilateral  . Carotid-subclavian bypass graft Left 08/26/2013    Procedure: BYPASS GRAFT CAROTID-SUBCLAVIAN;  Surgeon: Nada Libman, MD;  Location[facility] OR;  Service: Vascular;  Laterality: Left;  . Endovascular stent insertion N/A 08/26/2013    Procedure:  THORACIC STENT GRAFT INSERTION;  Surgeon: Nada Libman, MD;  Location: [facility] OR;  Service: Vascular;  Laterality: N/A;    History  Smoking status  . Former Smoker  . Types: Cigarettes  . Quit date: 04/16/1974  Smokeless tobacco  . Never Used    History  Alcohol Use No    No family history on file.  Review of Systems: The review of systems is per the HPI.  All other systems were reviewed and are negative.  Physical Exam: BP 132/60  Pulse 71  Ht 5' 10.5" (1.791 m)  Wt 165 lb 12.8 oz (75.206 kg)  BMI 23.45 kg/m2  SpO2 96% Patient is very pleasant and in no acute distress. Weight down 12 pounds since this last admission. Skin is warm and dry. Color is normal.  HEENT is unremarkable. Normocephalic/atraumatic. PERRL. Sclera are nonicteric. Neck is supple. No masses. No JVD. Lungs show decreased breath sounds, especially on the left. Cardiac exam shows a regular rate and rhythm. Abdomen is soft. Extremities are with just trace edema. Gait and ROM are intact. He is using a cane. No gross neurologic deficits noted.  Wt Readings from Last 3 Encounters:  10/21/13 165 lb 12.8 oz (75.206 kg)  10/11/13 187 lb 13.3 oz (85.2 kg)  09/18/13 176 lb (79.833 kg)      LABORATORY DATA: PENDING  Lab Results  Component Value Date   WBC 4.5 10/11/2013   HGB 7.5* 10/11/2013   HCT 25.4* 10/11/2013   PLT 197 10/11/2013   GLUCOSE 95 10/11/2013   CHOL 152 10/10/2013   TRIG 116 10/07/2013   HDL 38* 07/29/2010   LDLCALC  Value: 80        Total Cholesterol/HDL:CHD Risk Coronary Heart Disease Risk Table                     Men   Women  1/2 Average Risk   3.4    3.3  Average Risk       5.0   4.4  2 X Average Risk   9.6   7.1  3 X Average Risk  23.4   11.0        Use the calculated Patient Ratio above and the CHD Risk Table to determine the patient's CHD Risk.        ATP III CLASSIFICATION (LDL):  <100  mg/dL   Optimal  161-096  mg/dL   Near or Above                    Optimal  130-159  mg/dL   Borderline  045-409  mg/dL   High  >811     mg/dL   Very High 03/02/7828   ALT 87* 10/08/2013   AST 95* 10/08/2013   NA 142 10/11/2013   K 3.6* 10/11/2013   CL 108 10/11/2013   CREATININE 0.64 10/11/2013   BUN 9 10/11/2013   CO2 23 10/11/2013   TSH 1.102 05/08/2013   INR 1.74* 10/06/2013   HGBA1C 6.1* 08/27/2013      Echo Study Conclusions from March 2015  - Left ventricle: The cavity size was normal. Wall thickness was increased in a pattern of mild LVH. Systolic function was normal. The estimated ejection fraction was in the range of 50% to 55%. Doppler parameters are consistent with a reversible restrictive pattern, indicative of decreased left ventricular diastolic compliance and/or increased left atrial pressure (grade 3 diastolic dysfunction). - Aortic valve: Normal appearing tissue bioprosthetic valve with normal systolic gradient and no AR - Mitral valve: Calcified annulus. Mild regurgitation. - Left atrium: The atrium was moderately dilated. - Right ventricle: The cavity size was moderately dilated. - Right atrium: The atrium was severely dilated. - Atrial septum: No defect or patent foramen ovale was identified. - Tricuspid valve: Moderate regurgitation. - Pulmonary arteries: PA peak pressure: 51mm Hg (S).    Assessment / Plan: 1. Recent admission for sepsis/pneumonia/effusion - needs repeat CXR  2. Recent vascular surgery - slow recovery  3. Diastolic HF - his weight is down - he denies any shortness of breath - leave him on his current regimen. Recheck labs today  4. PAF - in sinus today - remains on amiodarone  5. Anemia - not  clear to me as to the etiology - needs repeat labs today - leave off aspirin for now.   6. CAD - no active chest pain  7. NSVT - recheck labs today. EKG still with prolonged QT at 504 and QTC of 528 - this is reviewed with Dr. Anne Fu (DOD) - we are leaving him on his current regimen. Check lytes and Mg today.   See back as planned.   Patient is agreeable to this plan and will call if any problems develop in the interim.   Rosalio Macadamia, RN, ANP-C [facility] [address] [place], [place]  [address] [phone]

## 2013-10-21 NOTE — Patient Instructions (Signed)
Stay on your current medicines  Please go to [facility] on the first floor for a chest Xray - you may walk in.   We will check labs today  Try to keep increasing your activity as tolerated  See [name] and Dr. Tyrone Sage as planned  Call the [facility] office at [phone] if you have any questions, problems or concerns.

## 2013-10-22 ENCOUNTER — Other Ambulatory Visit: Payer: Self-pay | Admitting: Cardiothoracic Surgery

## 2013-10-22 DIAGNOSIS — I7100 Dissection of unspecified site of aorta: Secondary | ICD-10-CM

## 2013-10-23 ENCOUNTER — Ambulatory Visit (INDEPENDENT_AMBULATORY_CARE_PROVIDER_SITE_OTHER): Payer: Medicare Other | Admitting: Cardiothoracic Surgery

## 2013-10-23 ENCOUNTER — Encounter: Payer: Self-pay | Admitting: Cardiothoracic Surgery

## 2013-10-23 VITALS — BP 145/65 | HR 73 | Resp 16 | Ht 70.5 in | Wt 165.0 lb

## 2013-10-23 DIAGNOSIS — I712 Thoracic aortic aneurysm, without rupture, unspecified: Secondary | ICD-10-CM

## 2013-10-23 DIAGNOSIS — I259 Chronic ischemic heart disease, unspecified: Secondary | ICD-10-CM

## 2013-10-23 DIAGNOSIS — A419 Sepsis, unspecified organism: Secondary | ICD-10-CM

## 2013-10-23 DIAGNOSIS — Z09 Encounter for follow-up examination after completed treatment for conditions other than malignant neoplasm: Secondary | ICD-10-CM

## 2013-10-23 DIAGNOSIS — N390 Urinary tract infection, site not specified: Secondary | ICD-10-CM

## 2013-10-23 DIAGNOSIS — I509 Heart failure, unspecified: Secondary | ICD-10-CM

## 2013-10-23 DIAGNOSIS — J90 Pleural effusion, not elsewhere classified: Secondary | ICD-10-CM

## 2013-10-23 NOTE — Progress Notes (Signed)
Patient ID: [name] [name], male   DOB: 05-13-1941, 73 y.o.   MRN: [id number]          [name] [name] [facility] Medical Record #[id number] Date of Birth: 1940/12/16  Referring: [name], MD Primary Care: [name]., MD  Chief Complaint:    Chief Complaint  Patient presents with  . 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  SURGICAL PROCEDURE: Left subclavian to carotid bypass with 8 mm graft  and ligation of proximal left subclavian artery. Endovascular repair of  descending thoracic aorta with coverage of the left subclavian and  distal extension with a proximal 40 x 40 x 20 GORE-TEX device with a  second extension 45 x 45 x 15 cm and aortogram x3.   History of Present Illness:     Patient is a 73 year old male who on 02/19/2009 presented with a large ascending aneurysm and acute aortic insufficiency and aortic arch aneurysm and at that time underwent aortic valve replacement replacement of aortic root and ascending aorta and replacement of aortic arch to the left subclavian artery. Since that time he's had an acute myocardial infarction and had a stent placed. In November 2014 prior to his yearly followup [facility] with an acute type III aortic dissection. He was treated medically, there is hospital stay he did develop persistent atrial fibrillation and ultimately was discharged home on anticoagulation.   Prior to his followup scan for the type III aortic dissection he was really admitted with signs and symptoms of congestive heart failure followup scan showed significant enlargement of the type III aortic dissection.  March 12 he underwent left subclavian to carotid bypass ligation of the proximal left subclavian artery an endovascular repair of descending thoracic aorta with coverage of the left subclavian artery. \  Since last seen when the patient still had a Foley catheter in he was admitted with urosepsis and respiratory failure requiring intubation. He slowly improved from this has been discharged home and return to the office today. His Foley catheter is out, a chest x-ray done 2 days ago is reviewed and improved. Overall the patient says he feels much better than he did prior to his hospitalization.     Current Activity/ Functional Status: Mobility/Ambulation: Independent with mobility prior to admission in the home on all surfaces with no assistive devices for unlimited distances.   Zubrod Score: At the time of surgery this patient's most appropriate activity status/level should be described as: []  Normal activity, no symptoms []  Symptoms, fully ambulatory [x]  Symptoms, in bed less than or equal to 50% of the time []  Symptoms, in bed greater than 50% of the time but less than 100% []  Bedridden []  Moribund   Past Medical History  Diagnosis Date  . Hyperlipidemia   . DVT (deep venous thrombosis)     post knee surgery  . Tobacco abuse   . HTN (hypertension)   . Descending aortic aneurysm   . Aortic valve insufficiency   . Thoracic aneurysm   . S/P AVR (aortic valve replacement)   . CAD (coronary artery disease)   . Atrial flutter   . CHF (congestive heart failure)     Past Surgical History  Procedure Laterality Date  . Right groin lymphocele  [id number]  . Aortic valve replacement  [id number]  . Replacement total knee      bilateral  . Carotid-subclavian bypass graft Left 08/26/2013    Procedure: BYPASS GRAFT CAROTID-SUBCLAVIAN;  Surgeon: Nada LibmanVance W Brabham, MD;  [facility];  Service: Vascular;  Laterality: Left;  . Endovascular stent insertion N/A 08/26/2013    Procedure:  THORACIC  STENT GRAFT INSERTION;  Surgeon: Nada LibmanVance W Brabham, MD;  Location: [facility] OR;  Service: Vascular;  Laterality: N/A;    History  Smoking status  . Former Smoker  . Types: Cigarettes  . Quit date: 04/16/1974  Smokeless tobacco  . Never Used   History  Alcohol Use No      Allergies  Allergen Reactions  . Lortab [Hydrocodone-Acetaminophen] Hives  . Morphine And Related Hives    Current Outpatient Prescriptions  Medication Sig Dispense Refill  . amiodarone (PACERONE) 200 MG tablet Take 1 tablet (200 mg total) by mouth daily.  30 tablet  3  . apixaban (ELIQUIS) 5 MG TABS tablet Take 5 mg by mouth 2 (two) times daily.      Marland Kitchen. atorvastatin (LIPITOR) 10 MG tablet Take 1 tablet (10 mg total) by mouth daily.  90 tablet  3  . furosemide (LASIX) 40 MG tablet Take 1 tablet (40 mg total) by mouth 2 (two) times daily.  60 tablet  11  . lansoprazole (PREVACID) 30 MG capsule Take 30 mg by mouth daily.       Marland Kitchen. loratadine (CLARITIN) 10 MG tablet Take 10 mg by mouth daily.       . metoprolol tartrate (LOPRESSOR) 12.5 mg TABS tablet Take 0.5 tablets (12.5 mg total) by mouth 2 (two) times daily.  60 tablet  1  . Multiple Vitamin (MULTIVITAMIN WITH MINERALS) TABS tablet Take 1 tablet by mouth daily.      . nitroGLYCERIN (NITROSTAT) 0.4 MG SL tablet Place 0.4 mg under the tongue every 5 (five) minutes as needed.        . potassium chloride SA (K-DUR,KLOR-CON) 20 MEQ tablet Take 2 tablets (40 mEq total) by mouth daily.  60 tablet  11  . tamsulosin (FLOMAX) 0.4 MG CAPS capsule Take 0.4 mg by mouth 2 (two) times daily.        No current facility-administered medications for this visit.           Physical Exam: BP 145/65  Pulse 73  Resp 16  Ht 5' 10.5" (1.791 m)  Wt 165 lb (74.844 kg)  BMI 23.33 kg/m2  SpO2 95%  General appearance: alert, cooperative, appears older than stated age, fatigued, no distress and slowed mentation Neurologic: intact Heart: regular rate and rhythm, S1, S2 normal, no  murmur, click, rub or gallop, normal apical impulse, no click and no rub Lungs: clear to auscultation bilaterally Abdomen: soft, non-tender; bowel sounds normal; no masses,  no organomegaly Extremities: extremities normal, atraumatic, no cyanosis or edema, mild  edema, redness or tenderness in the calves or thighs and no ulcers, gangrene or trophic changes Wound: The left neck incision is well-healed, has a strong probable left brachial and radial pulses  The left groin puncture site is well-healed   Diagnostic Studies & Laboratory data:     Recent Radiology Findings:  Dg Chest 2 View  10/21/2013   CLINICAL DATA:  follow up effusion  EXAM: CHEST  2 VIEW  COMPARISON:  DG CHEST 1V PORT dated 10/10/2013; CT ANGIO CHEST AORTA W/CM &  WO/CM dated 10/06/2013  FINDINGS: Cardiac silhouette is enlarged. Patient is status post median sternotomy and aortic valve replacement. Patient is status post aortic aneurysm stent graft repair which appears stable.  These there is decreased density in the left lung base. Mild residual blunting of the left costophrenic angle is appreciated. There is residual increased density in the left lung base with a nodular component. The remaining aerated portions of the lungs are clear. There is hyperinflation of the right hemi thorax. No acute osseous abnormalities.  IMPRESSION: Improving left pleural effusion. There is residual density in the left lung base with a nodular component. Differential considerations are atelectasis versus scarring. In the appropriate clinical setting an infiltrate cannot be excluded. Continue monitoring with chest radiography is recommended.   Electronically Signed   By: Salome Holmes M.D.   On: 10/21/2013 16:35    Ct Angio Head W/cm &/or Wo Cm  08/22/2013   CLINICAL DATA:  73 year old male with history of thoracic aneurysm status post repair. Progressive weight gain, swelling and shortness of Breath. Recently thought to have sinus infection, treated with  antibiotics and steroids. Symptoms increasing. Initial encounter.  EXAM: CT ANGIOGRAPHY HEAD AND NECK  TECHNIQUE: Multidetector CT imaging of the head and neck was performed using the standard protocol during bolus administration of intravenous contrast. Multiplanar CT image reconstructions and MIPs were obtained to evaluate the vascular anatomy. Carotid stenosis measurements (when applicable) are obtained utilizing NASCET criteria, using the distal internal carotid diameter as the denominator.  CONTRAST:  50mL OMNIPAQUE IOHEXOL 350 MG/ML SOLN  COMPARISON:  Chest abdomen and pelvis CTA from the same day reported separately.  FINDINGS: CTA HEAD FINDINGS  Calvarium intact. Negative scalp soft tissues. Cerebral volume is within normal limits for age. No midline shift, ventriculomegaly, mass effect, evidence of mass lesion, intracranial hemorrhage or evidence of cortically based acute infarction. Gray-white matter differentiation is within normal limits throughout the brain. No abnormal enhancement identified.  VASCULAR FINDINGS:  Major intracranial venous structures are enhancing.  Dominant distal left vertebral artery is patent without stenosis despite some calcified plaque. Retrograde supply to the non dominant distal right vertebral artery. Right PICA and AICA are patent. Dominant left AICA. The basilar artery is patent without stenosis. SCA and PCA origins are within normal limits. Normal left posterior communicating artery, the right is diminutive or absent. Bilateral PCA branches are within normal limits.  Some cavernous sinus venous contrast artifact is present. Both ICA siphons are patent. Ophthalmic and posterior communicating arteries are within normal limits. Normal carotid termini. Normal MCA and ACA origins. Anterior communicating artery and bilateral ACA branches are within normal limits. Bilateral MCA branches are within normal limits.  Review of the MIP images confirms the above findings.  CTA NECK  FINDINGS  Chest findings are reported separately (please see that report). Negative thyroid, larynx, pharynx, parapharyngeal spaces, retropharyngeal space. Extensive dental hardware artifact. Negative visualized sublingual space, submandibular glands and submandibular space. Negative parotid glands. No acute orbits soft tissue findings.  No cervical lymphadenopathy. Mucous retention cyst left maxillary sinus. Other Visualized paranasal sinuses and mastoids are clear. No acute osseous abnormality identified. Degenerative changes in the cervical spine.  VASCULAR FINDINGS:  SEE CHEST CTA FINDINGS FROM TODAY REPORTED SEPARATELY.  Both internal jugular veins are patent. The left subclavian and innominate vein are patent. Negative non contrast appearance of the right subclavian. The right innominate vein is patent.  Great vessel origins are patent, dolichoectatic. Widespread mild soft and calcified circumferential plaque in the great  vessels.  Patent, mildly dolichoectatic right CCA. Patent right carotid bifurcation with no right ICA stenosis despite calcified plaque. Tortuous, mildly dolichoectatic cervical right ICA.  No proximal right subclavian artery stenosis, with some dolichoectasia. Non dominant right vertebral artery is occluded at its origin. Distal right vertebral artery flow is reconstituted briefly at the C2-C3 level, but then re- occludes below the skullbase. Intracranial findings detailed above.  Patent, the dolichoectatic left CCA. Calcified plaque at the left carotid bifurcation which is significantly affected by dental streak artifact. Loss of enhancement in the left ICA origin and bulb felt to be artifactual (see series 12, image 172). Tortuous/dolichoectatic but otherwise negative cervical left ICA be on that level.  No proximal left subclavian artery stenosis. Dominant left vertebral artery origin widely patent. Tortuous proximal left vertebral artery. Dominant left vertebral artery tortuous and  patent to the skullbase.  Review of the MIP images confirms the above findings.  IMPRESSION: 1. Abnormal thoracic aorta, see chest CTA from today reported separately. 2. Patent, dolichoectatic great vessels without arterial dissection in the neck. 3. Occluded non dominant right vertebral artery. Patent, dominant left vertebral artery. 4. Suspect artifactual decreased enhancement of the left ICA origin, related to severe dental hardware artifact. Absence of any proximal left ICA stenosis could be confirmed with either carotid Doppler or neck MRA if necessary. 5. Distal right vertebral artery supplied in a retrograde fashion from the left. Intracranial atherosclerosis, but no intracranial stenosis and otherwise negative intracranial CTA. 6.  No acute intracranial abnormality.   Electronically Signed   By: Augusto Gamble M.D.   On: 08/22/2013 12:26     CLINICAL DATA: Chest pain radiating to the back.  EXAM:  CT ANGIOGRAPHY CHEST WITH CONTRAST  TECHNIQUE:  Multidetector CT imaging of the chest was performed using the  standard protocol during bolus administration of intravenous  contrast. Multiplanar CT image reconstructions including MIPs were  obtained to evaluate the vascular anatomy.  CONTRAST: OMNIPAQUE IOHEXOL 350 MG/ML SOLN  COMPARISON: CTA of the chest performed 05/30/2012  FINDINGS:  The thoracic aorta is dilated to 4.5 cm in maximal diameter, perhaps  slightly more prominent than on the prior study. There is a new  dissection flap arising from the distal aspect of the aortic arch,  with a single fenestration at the proximal aspect of the dissection,  and decreased enhancement of blood within the false lumen. The  distal aspect of the false lumen appears to be fully clotted; the  false lumen resolves just proximal to the level of the diaphragm.  This causes mild luminal narrowing along the descending thoracic  aorta.  The patient is status post ascending aortic repair; postoperative    change is unremarkable in appearance. The great vessels are  unremarkable in appearance. There is no evidence of pulmonary  embolus.  Mild left basilar atelectasis is noted. Minimal emphysematous change  is noted at the lung apices. The lungs are otherwise clear. There is  no evidence of pleural effusion or pneumothorax. No masses are  identified.  No mediastinal lymphadenopathy is appreciated. No pericardial  effusion is seen. The patient is status post median sternotomy. No  axillary lymphadenopathy is seen. The thyroid gland is unremarkable  in appearance.  The visualized portions of the liver and spleen are unremarkable. A  3.2 cm cyst is noted at the upper pole of the right kidney.  No acute osseous abnormalities are seen. Mild degenerative change is  noted at the lower cervical spine.  Review of  the MIP images confirms the above findings.  IMPRESSION:  1. New dissection flap arising from the distal aspect of the aortic  arch, extending along the descending thoracic aorta and resolving  just proximal to the level of the diaphragm. No evidence of  involvement of branch vessels. The distal aspect of the false lumen  appears to be fully clotted, while there is decreased enhancement of  blood within the more proximal false lumen, with a single  fenestration noted at the proximal aspect of the dissection.  Associated mild luminal narrowing along the descending thoracic  aorta. The thoracic aorta measures 4.5 cm in maximal diameter,  perhaps slightly more prominent than on the prior study.  2. Ascending thoracic aorta repair is unremarkable in appearance.  3. No evidence of pulmonary embolus.  4. Mild left basilar atelectasis noted; minimal emphysematous change  at the lung apices. Lungs otherwise clear.  5. Right renal cyst noted.  These results were called by telephone at the time of interpretation  on 05/04/2013 at 4:10 AM to Dr. Brandt Loosen , who verbally  acknowledged these  results.  Electronically Signed  By: Roanna Raider M.D.  On: 05/04/2013 04:27   Recent Lab Findings: Lab Results  Component Value Date   WBC 5.9 10/21/2013   HGB 9.8* 10/21/2013   HCT 31.4* 10/21/2013   PLT 335.0 10/21/2013   GLUCOSE 105* 10/21/2013   CHOL 152 10/10/2013   TRIG 116 10/07/2013   HDL 38* 07/29/2010   LDLCALC  Value: 80        Total Cholesterol/HDL:CHD Risk Coronary Heart Disease Risk Table                     Men   Women  1/2 Average Risk   3.4   3.3  Average Risk       5.0   4.4  2 X Average Risk   9.6   7.1  3 X Average Risk  23.4   11.0        Use the calculated Patient Ratio above and the CHD Risk Table to determine the patient's CHD Risk.        ATP III CLASSIFICATION (LDL):  <100     mg/dL   Optimal  960-454  mg/dL   Near or Above                    Optimal  130-159  mg/dL   Borderline  098-119  mg/dL   High  >147     mg/dL   Very High 02/15/5620   ALT 87* 10/08/2013   AST 95* 10/08/2013   NA 140 10/21/2013   K 4.8 10/21/2013   CL 102 10/21/2013   CREATININE 0.9 10/21/2013   BUN 15 10/21/2013   CO2 30 10/21/2013   TSH 1.102 05/08/2013   INR 1.74* 10/06/2013   HGBA1C 6.1* 08/27/2013      Assessment / Plan:   Patient status post left subclavian carotid bypass ligation of subclavian artery an endovascular repair of a chronic type III aortic dissection , with previous biologic Bentall and arch replacement . Now recovering from an episode of urosepsis respiratory failure requiring intubation, which improved now with clearing of left pleural effusion.  Plan to see the patient back in 3 months with a followup CTA of the chest and abdomen to evaluate the endovascular repair of his type III aortic dissection.  Delight Ovens MD 10/23/2013 9:41 AM

## 2013-11-07 LAB — FUNGUS CULTURE W SMEAR: Fungal Smear: NONE SEEN

## 2013-11-13 DIAGNOSIS — R339 Retention of urine, unspecified: Secondary | ICD-10-CM | POA: Diagnosis not present

## 2013-11-13 DIAGNOSIS — N401 Enlarged prostate with lower urinary tract symptoms: Secondary | ICD-10-CM | POA: Diagnosis not present

## 2013-11-13 DIAGNOSIS — N139 Obstructive and reflux uropathy, unspecified: Secondary | ICD-10-CM | POA: Diagnosis not present

## 2013-11-24 LAB — AFB CULTURE WITH SMEAR (NOT AT ARMC): ACID FAST SMEAR: NONE SEEN

## 2013-12-02 ENCOUNTER — Encounter: Payer: Self-pay | Admitting: Interventional Cardiology

## 2013-12-02 ENCOUNTER — Ambulatory Visit (INDEPENDENT_AMBULATORY_CARE_PROVIDER_SITE_OTHER): Payer: Medicare Other | Admitting: Interventional Cardiology

## 2013-12-02 VITALS — BP 133/53 | HR 54 | Ht 70.5 in | Wt 167.8 lb

## 2013-12-02 DIAGNOSIS — I7100 Dissection of unspecified site of aorta: Secondary | ICD-10-CM | POA: Diagnosis not present

## 2013-12-02 DIAGNOSIS — I259 Chronic ischemic heart disease, unspecified: Secondary | ICD-10-CM

## 2013-12-02 DIAGNOSIS — I2510 Atherosclerotic heart disease of native coronary artery without angina pectoris: Secondary | ICD-10-CM

## 2013-12-02 DIAGNOSIS — I4891 Unspecified atrial fibrillation: Secondary | ICD-10-CM | POA: Diagnosis not present

## 2013-12-02 MED ORDER — NITROGLYCERIN 0.4 MG SL SUBL: 0.4000 mg | SUBLINGUAL_TABLET | SUBLINGUAL | Status: DC | PRN

## 2013-12-02 MED ORDER — AMIODARONE HCL 200 MG PO TABS: 200.0000 mg | ORAL_TABLET | Freq: Every day | ORAL | Status: DC

## 2013-12-02 NOTE — Patient Instructions (Signed)
Your physician recommends that you continue on your current medications as directed. Please refer to the Current Medication list given to you today.  Your physician wants you to follow-up in: 6 months with Dr. Varanasi.  You will receive a reminder letter in the mail two months in advance. If you don't receive a letter, please call our office to schedule the follow-up appointment.  

## 2013-12-02 NOTE — Progress Notes (Signed)
Patient ID: [name] [name], male   DOB: 05-20-1941, 73 y.o.   MRN: [id number]    [address] [place], [place]  [address] Phone: [phone] Fax:  [phone]) [phone]  Date:  12/02/2013   ID:  [name] RueRobert D [name], DOB 05-20-1941, MRN [id number]  PCP:  Corky CraftsVARANASI,JAYADEEP S., MD      History of Present Illness:  [name] DeckRobert D Necaise is a 73 y.o. male with h/o aortic disection, CAD - DES to circumflex in 2011. 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 He was admitted to the hospital on 10/05/13 for urosepsis and was on the ventilator for 2 days. Also has thoracentesis for pleural effusion (1200cc). He was discharged 5 or 6 days after admission. He is recovering slowly. Feels okay otherwise. He fell once since hospital discharge after taking his Ambien. Hasn't have to take Ambien to help him sleep. Denies headache, palpitations, chest pain, dyspnea, orthopnea, PND, LE edema. No bleeding with Eliquis. BP is around 120-130/58-64 at home. Walks daily for 20min w/o chest pain or shortness of breath. Wants to know if it is okay for him to participate in cardiac rehab.   Wt Readings from Last 3 Encounters:  12/02/13 167 lb 12.8 oz (76.114 kg)  10/23/13 165 lb (74.844 kg)  10/21/13 165 lb 12.8 oz (75.206 kg)     Past Medical History  Diagnosis Date  . Hyperlipidemia   . DVT (deep venous thrombosis)     post knee surgery  . Tobacco abuse   . HTN (hypertension)   . Descending aortic aneurysm   . Aortic valve insufficiency   . Thoracic aneurysm   . S/P AVR (aortic valve replacement)   . CAD (coronary artery disease)   . Atrial flutter   . CHF (congestive heart failure)     Current Outpatient Prescriptions  Medication Sig Dispense Refill  . amiodarone (PACERONE) 200 MG tablet Take 1 tablet (200 mg total) by mouth daily.  30 tablet  3  . apixaban (ELIQUIS) 5 MG TABS tablet Take 5 mg by mouth 2 (two) times daily.      Marland Kitchen. atorvastatin (LIPITOR) 10 MG  tablet Take 1 tablet (10 mg total) by mouth daily.  90 tablet  3  . furosemide (LASIX) 40 MG tablet Take 1 tablet (40 mg total) by mouth 2 (two) times daily.  60 tablet  11  . lansoprazole (PREVACID) 30 MG capsule Take 30 mg by mouth daily.       Marland Kitchen. loratadine (CLARITIN) 10 MG tablet Take 10 mg by mouth daily.       . metoprolol tartrate (LOPRESSOR) 12.5 mg TABS tablet Take 0.5 tablets (12.5 mg total) by mouth 2 (two) times daily.  60 tablet  1  . Multiple Vitamin (MULTIVITAMIN WITH MINERALS) TABS tablet Take 1 tablet by mouth daily.      . nitroGLYCERIN (NITROSTAT) 0.4 MG SL tablet Place 0.4 mg under the tongue every 5 (five) minutes as needed.        . potassium chloride SA (K-DUR,KLOR-CON) 20 MEQ tablet Take 2 tablets (40 mEq total) by mouth daily.  60 tablet  11  . tamsulosin (FLOMAX) 0.4 MG CAPS capsule Take 0.4 mg by mouth 2 (two) times daily.        No current facility-administered medications for this visit.    Allergies:    Allergies  Allergen Reactions  . Lortab [Hydrocodone-Acetaminophen] Hives  . Morphine And Related Hives  Social History:  The patient  reports that he quit smoking about 39 years ago. His smoking use included Cigarettes. He smoked 0.00 packs per day. He has never used smokeless tobacco. He reports that he does not drink alcohol or use illicit drugs.   Family History:  The patient's family history is not on file.   ROS:  Please see the history of present illness.  No nausea, vomiting.  No fevers, chills.  No focal weakness.  No dysuria. Getting stronger   All other systems reviewed and negative.   PHYSICAL EXAM: VS:  BP 133/53  Pulse 54  Ht 5' 10.5" (1.791 m)  Wt 167 lb 12.8 oz (76.114 kg)  BMI 23.73 kg/m2 Well nourished, well developed, in no acute distress HEENT: normal Neck: no JVD, no carotid bruits Cardiac:  normal S1, S2; RRR; 2/6 systolic murmur Lungs:  clear to auscultation bilaterally, no wheezing, rhonchi or rales Abd: soft, nontender, no  hepatomegaly Ext: no edema Skin: warm and dry Neuro:   no focal abnormalities noted       ASSESSMENT AND PLAN:  1) Aortic dissection: Continue aggressive blood pressure control. Blood pressure is very well controlled. S/p stent to thoracic aorta and carotid-subclavian bypass  2). atrial fibrillation: Continue anticoagulation for stroke prevention. Continue amiodarone for rhythm control. No signs of bradycardia. Back on Eliquis. Appears to be in NSR at this time.  3) CAD:  No angina. lipid test soon.  OK for cardiac rehab.  4) Recent urosepsis.  Much improved.  Feeling better.    Signed, Fredric Mare, MD, [facility] 12/02/2013 10:49 AM

## 2013-12-18 DIAGNOSIS — N401 Enlarged prostate with lower urinary tract symptoms: Secondary | ICD-10-CM | POA: Diagnosis not present

## 2013-12-18 DIAGNOSIS — N139 Obstructive and reflux uropathy, unspecified: Secondary | ICD-10-CM | POA: Diagnosis not present

## 2014-01-05 ENCOUNTER — Other Ambulatory Visit: Payer: Self-pay

## 2014-01-05 DIAGNOSIS — I7101 Dissection of thoracic aorta: Secondary | ICD-10-CM

## 2014-01-05 DIAGNOSIS — I71019 Dissection of thoracic aorta, unspecified: Secondary | ICD-10-CM

## 2014-01-13 ENCOUNTER — Other Ambulatory Visit: Payer: Self-pay | Admitting: *Deleted

## 2014-02-03 DIAGNOSIS — E7800 Pure hypercholesterolemia, unspecified: Secondary | ICD-10-CM | POA: Diagnosis not present

## 2014-02-03 DIAGNOSIS — Z79899 Other long term (current) drug therapy: Secondary | ICD-10-CM | POA: Diagnosis not present

## 2014-02-03 DIAGNOSIS — I71019 Dissection of thoracic aorta, unspecified: Secondary | ICD-10-CM | POA: Diagnosis not present

## 2014-02-03 DIAGNOSIS — I7101 Dissection of thoracic aorta: Secondary | ICD-10-CM | POA: Diagnosis not present

## 2014-02-03 DIAGNOSIS — E785 Hyperlipidemia, unspecified: Secondary | ICD-10-CM | POA: Diagnosis not present

## 2014-02-12 ENCOUNTER — Ambulatory Visit (INDEPENDENT_AMBULATORY_CARE_PROVIDER_SITE_OTHER): Payer: Medicare Other | Admitting: Cardiothoracic Surgery

## 2014-02-12 ENCOUNTER — Ambulatory Visit
Admission: RE | Admit: 2014-02-12 | Discharge: 2014-02-12 | Disposition: A | Discharge status: Home / Self Care | Payer: Medicare Other | Source: Ambulatory Visit | Attending: Cardiothoracic Surgery | Admitting: Cardiothoracic Surgery

## 2014-02-12 ENCOUNTER — Encounter: Payer: Self-pay | Admitting: Cardiothoracic Surgery

## 2014-02-12 VITALS — BP 141/57 | HR 64 | Ht 70.0 in | Wt 167.0 lb

## 2014-02-12 DIAGNOSIS — I712 Thoracic aortic aneurysm, without rupture, unspecified: Secondary | ICD-10-CM | POA: Diagnosis not present

## 2014-02-12 DIAGNOSIS — I71019 Dissection of thoracic aorta, unspecified: Secondary | ICD-10-CM

## 2014-02-12 DIAGNOSIS — I7101 Dissection of thoracic aorta: Secondary | ICD-10-CM

## 2014-02-12 DIAGNOSIS — Z952 Presence of prosthetic heart valve: Secondary | ICD-10-CM

## 2014-02-12 DIAGNOSIS — I714 Abdominal aortic aneurysm, without rupture, unspecified: Secondary | ICD-10-CM | POA: Diagnosis not present

## 2014-02-12 DIAGNOSIS — Z954 Presence of other heart-valve replacement: Secondary | ICD-10-CM

## 2014-02-12 DIAGNOSIS — I7100 Dissection of unspecified site of aorta: Secondary | ICD-10-CM

## 2014-02-12 DIAGNOSIS — I719 Aortic aneurysm of unspecified site, without rupture: Secondary | ICD-10-CM

## 2014-02-12 DIAGNOSIS — I259 Chronic ischemic heart disease, unspecified: Secondary | ICD-10-CM

## 2014-02-12 MED ORDER — IOHEXOL 350 MG/ML SOLN
80.0000 mL | Freq: Once | INTRAVENOUS | Status: AC | PRN
  Administered 2014-02-12: 80 mL via INTRAVENOUS

## 2014-02-12 NOTE — Progress Notes (Signed)
Patient ID: [name] [name], male   DOB: 1941-06-06, 73 y.o.   MRN: [id number]          [name] [name] [facility] Medical Record #[id number] Date of Birth: 02-Jun-1941  Referring: [name], MD Primary Care: [name]., MD  Chief Complaint:    Chief Complaint  Patient presents with  . 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  SURGICAL PROCEDURE: Left subclavian to carotid bypass with 8 mm graft  and ligation of proximal left subclavian artery. Endovascular repair of  descending thoracic aorta with coverage of the left subclavian and  distal extension with a proximal 40 x 40 x 20 GORE-TEX device with a  second extension 45 x 45 x 15 cm and aortogram x3.  2010- aortic valve replacement replacement of aortic root and ascending aorta and replacement of aortic arch to the left subclavian artery.  History of Present Illness:     Patient is a 73 year old male who on 02/19/2009 presented with a large ascending aneurysm and acute aortic insufficiency and aortic arch aneurysm and at that time underwent aortic valve replacement replacement of aortic root and ascending aorta and replacement of aortic arch to the left subclavian artery. Since that time he's had an acute myocardial infarction and had a stent placed. In November 2014 prior to his yearly followup [facility] with an acute type III aortic dissection. He was treated medically, there is hospital stay he did develop persistent atrial fibrillation and ultimately was discharged home on anticoagulation.   Prior to his followup scan for the type III aortic dissection he was really admitted with signs and symptoms  of congestive heart failure followup scan showed significant enlargement of the type III aortic dissection. March 12 he underwent left subclavian to carotid bypass ligation of the proximal left subclavian artery an endovascular repair of descending thoracic aorta with coverage of the left subclavian artery. \   Since last seen the patient and his wife both note that he's been doing better than he has for several years. His urinary problems have been straightened out.  Current Activity/ Functional Status: Mobility/Ambulation: Independent with mobility prior to admission in the home on all surfaces with no assistive devices for unlimited distances.   Zubrod Score: At the time of surgery this patient's most appropriate activity status/level should be described as:  Normal activity, no symptoms  Symptoms, fully ambulatory  Symptoms, in bed less than or equal to 50% of the time  Symptoms, in bed greater than 50% of the time but less than 100%  Bedridden  Moribund   Past Medical History  Diagnosis Date  . Hyperlipidemia   . DVT (deep venous thrombosis)     post knee surgery  . Tobacco abuse   . HTN (hypertension)   . Descending aortic aneurysm   . Aortic valve insufficiency   . Thoracic aneurysm   . S/P AVR (aortic valve replacement)   . CAD (coronary artery disease)   . Atrial flutter   . CHF (congestive heart failure)     Past Surgical History  Procedure Laterality Date  . Right groin lymphocele  [id number]  . Aortic valve replacement  [id number]  . Replacement total knee  bilateral  . Carotid-subclavian bypass graft Left 08/26/2013    Procedure: BYPASS GRAFT CAROTID-SUBCLAVIAN;  Surgeon: Nada Libman, MD;  Location: [facility] OR;  Service: Vascular;  Laterality: Left;  . Endovascular stent insertion N/A 08/26/2013    Procedure:  THORACIC STENT GRAFT INSERTION;  Surgeon: Nada Libman, MD;  Location: [facility] OR;  Service: Vascular;  Laterality: N/A;    History  Smoking  status  . Former Smoker  . Types: Cigarettes  . Quit date: 04/16/1974  Smokeless tobacco  . Never Used   History  Alcohol Use No      Allergies  Allergen Reactions  . Lortab [Hydrocodone-Acetaminophen] Hives  . Morphine And Related Hives    Current Outpatient Prescriptions  Medication Sig Dispense Refill  . amiodarone (PACERONE) 200 MG tablet Take 1 tablet (200 mg total) by mouth daily.  30 tablet  6  . apixaban (ELIQUIS) 5 MG TABS tablet Take 5 mg by mouth 2 (two) times daily.      Marland Kitchen atorvastatin (LIPITOR) 10 MG tablet Take 1 tablet (10 mg total) by mouth daily.  90 tablet  3  . furosemide (LASIX) 40 MG tablet Take 1 tablet (40 mg total) by mouth 2 (two) times daily.  60 tablet  11  . lansoprazole (PREVACID) 30 MG capsule Take 30 mg by mouth daily.       Marland Kitchen loratadine (CLARITIN) 10 MG tablet Take 10 mg by mouth daily.       . metoprolol tartrate (LOPRESSOR) 12.5 mg TABS tablet Take 0.5 tablets (12.5 mg total) by mouth 2 (two) times daily.  60 tablet  1  . Multiple Vitamin (MULTIVITAMIN WITH MINERALS) TABS tablet Take 1 tablet by mouth daily.      . potassium chloride SA (K-DUR,KLOR-CON) 20 MEQ tablet Take 2 tablets (40 mEq total) by mouth daily.  60 tablet  11  . tamsulosin (FLOMAX) 0.4 MG CAPS capsule Take 0.4 mg by mouth 2 (two) times daily.       . nitroGLYCERIN (NITROSTAT) 0.4 MG SL tablet Place 1 tablet (0.4 mg total) under the tongue every 5 (five) minutes as needed.  25 tablet  6   No current facility-administered medications for this visit.      Physical Exam: BP 141/57  Pulse 64  Ht 5\' 10"  (1.778 m)  Wt 167 lb (75.751 kg)  BMI 23.96 kg/m2  SpO2 98%  General appearance: alert, cooperative, appears older than stated age, fatigued, no distress and slowed mentation Neurologic: intact Heart: regular rate and rhythm, S1, S2 normal, no murmur, click, rub or gallop, normal apical impulse, no click and no rub Lungs: clear to auscultation bilaterally Abdomen: soft,  non-tender; bowel sounds normal; no masses,  no organomegaly Extremities: extremities normal, atraumatic, no cyanosis or edema, mild  edema, redness or tenderness in the calves or thighs and no ulcers, gangrene or trophic changes Wound: The left neck incision is well-healed, has a strong probable left brachial and radial pulses  The left groin puncture site is well-healed   Diagnostic Studies & Laboratory data:     Recent Radiology Findings:   Ct Angio Chest Aorta W/cm &/or Wo/cm  02/12/2014   CLINICAL DATA:  Evaluate aortic dissection. Followup thoracic aortic aneurysm. Previous aortic valve replacement with thoracic aortic aneurysm stent graft.  EXAM: CT ANGIOGRAPHY CHEST AND ABDOMEN  TECHNIQUE: Multidetector CT imaging of the chest and abdomen was performed using the standard protocol during bolus administration of intravenous contrast. Multiplanar CT image reconstructions and MIPs were  obtained to evaluate the vascular anatomy.  CONTRAST:  80mL OMNIPAQUE IOHEXOL 350 MG/ML SOLN  COMPARISON:  10/06/2013, 08/22/2013 as well as 05/18/2011  FINDINGS: CTA CHEST FINDINGS  Sternotomy wires are present. The lungs are adequately inflated without consolidation or effusion. There is very minimal apical emphysematous disease. There is a 7-8 mm nodule over the anterior right upper lobe stable since 2012. There is moderate stable cardiomegaly. No change and coronary artery calcifications with suggestion of stent over the lateral circumflex artery. Evidence of previous aortic valve replacement. No pericardial or mediastinal fluid collection. No mediastinal or hilar adenopathy.  The posterior arch/descending thoracic aortic stent graft is unchanged which excludes the previously noted aneurysm/dissection. No current evidence of a dissection. Significantly less the low density thrombus in the native aneurysm sac. No evidence of endoleak. 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  Review of the MIP images confirms the above findings.  CTA ABDOMEN FINDINGS  Abdominal images demonstrate minimal cholelithiasis. The liver, spleen, pancreas and adrenal glands are within normal. Kidneys are normal in size without hydronephrosis or nephrolithiasis. No change in a 3.4 cm cyst over the upper pole right kidney. Few other smaller subcentimeter renal cortical hypodensities likely cysts but too small to characterize. There is diverticulosis of the colon. Appendix is within normal. Visualize ureters are within normal.  There is mild calcified plaque over the abdominal aorta. There is no evidence of abdominal aortic dissection. There is mild aneurysmal dilatation of the infrarenal abdominal aorta measuring 3.0 cm in AP diameter just below the take-off of the renal arteries. The celiac axis, superior and inferior mesenteric arteries are patent. Single bilateral renal arteries are patent with mild focal narrowing at the origin of the right renal artery. There are degenerative changes of the spine.  Review of the MIP images confirms the above findings.  IMPRESSION: Stable aortic stent graft over the aortic arch and descending thoracic aorta with significant reduction in the mural thrombus of the native aneurysm sac. No evidence of dissection or endoleak.  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 No evidence of abdominal aortic dissection.  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  Stable moderate cardiomegaly with atherosclerotic coronary artery disease and left lateral circumflex stent. Previous median sternotomy and aortic valve replacement.  Mild cholelithiasis.  Bilateral stable renal cysts with the largest measuring 3.4 cm over the upper pole right kidney.  Diverticulosis of the colon.   Electronically Signed   By: Elberta Fortis M.D.   On: 02/12/2014 10:02    Ct  Angio Head W/cm &/or Wo Cm  08/22/2013   CLINICAL DATA:  73 year old male with history of thoracic aneurysm status post repair. Progressive weight gain, swelling and shortness of Breath. Recently thought to have sinus infection, treated with antibiotics and steroids. Symptoms increasing. Initial encounter.  EXAM: CT ANGIOGRAPHY HEAD AND NECK  TECHNIQUE: Multidetector CT imaging of the head and neck was performed using the standard protocol during bolus administration of intravenous contrast. Multiplanar CT image reconstructions and MIPs were obtained to evaluate the vascular anatomy. Carotid stenosis measurements (when applicable) are obtained utilizing NASCET criteria, using the distal internal carotid diameter as the denominator.  CONTRAST:  50mL OMNIPAQUE IOHEXOL 350 MG/ML SOLN  COMPARISON:  Chest abdomen and pelvis CTA from the same day reported separately.  FINDINGS: CTA HEAD FINDINGS  Calvarium intact. Negative scalp soft tissues. Cerebral volume is  within normal limits for age. No midline shift, ventriculomegaly, mass effect, evidence of mass lesion, intracranial hemorrhage or evidence of cortically based acute infarction. Gray-white matter differentiation is within normal limits throughout the brain. No abnormal enhancement identified.  VASCULAR FINDINGS:  Major intracranial venous structures are enhancing.  Dominant distal left vertebral artery is patent without stenosis despite some calcified plaque. Retrograde supply to the non dominant distal right vertebral artery. Right PICA and AICA are patent. Dominant left AICA. The basilar artery is patent without stenosis. SCA and PCA origins are within normal limits. Normal left posterior communicating artery, the right is diminutive or absent. Bilateral PCA branches are within normal limits.  Some cavernous sinus venous contrast artifact is present. Both ICA siphons are patent. Ophthalmic and posterior communicating arteries are within normal limits. Normal  carotid termini. Normal MCA and ACA origins. Anterior communicating artery and bilateral ACA branches are within normal limits. Bilateral MCA branches are within normal limits.  Review of the MIP images confirms the above findings.  CTA NECK FINDINGS  Chest findings are reported separately (please see that report). Negative thyroid, larynx, pharynx, parapharyngeal spaces, retropharyngeal space. Extensive dental hardware artifact. Negative visualized sublingual space, submandibular glands and submandibular space. Negative parotid glands. No acute orbits soft tissue findings.  No cervical lymphadenopathy. Mucous retention cyst left maxillary sinus. Other Visualized paranasal sinuses and mastoids are clear. No acute osseous abnormality identified. Degenerative changes in the cervical spine.  VASCULAR FINDINGS:  SEE CHEST CTA FINDINGS FROM TODAY REPORTED SEPARATELY.  Both internal jugular veins are patent. The left subclavian and innominate vein are patent. Negative non contrast appearance of the right subclavian. The right innominate vein is patent.  Great vessel origins are patent, dolichoectatic. Widespread mild soft and calcified circumferential plaque in the great vessels.  Patent, mildly dolichoectatic right CCA. Patent right carotid bifurcation with no right ICA stenosis despite calcified plaque. Tortuous, mildly dolichoectatic cervical right ICA.  No proximal right subclavian artery stenosis, with some dolichoectasia. Non dominant right vertebral artery is occluded at its origin. Distal right vertebral artery flow is reconstituted briefly at the C2-C3 level, but then re- occludes below the skullbase. Intracranial findings detailed above.  Patent, the dolichoectatic left CCA. Calcified plaque at the left carotid bifurcation which is significantly affected by dental streak artifact. Loss of enhancement in the left ICA origin and bulb felt to be artifactual (see series 12, image 172). Tortuous/dolichoectatic but  otherwise negative cervical left ICA be on that level.  No proximal left subclavian artery stenosis. Dominant left vertebral artery origin widely patent. Tortuous proximal left vertebral artery. Dominant left vertebral artery tortuous and patent to the skullbase.  Review of the MIP images confirms the above findings.  IMPRESSION: 1. Abnormal thoracic aorta, see chest CTA from today reported separately. 2. Patent, dolichoectatic great vessels without arterial dissection in the neck. 3. Occluded non dominant right vertebral artery. Patent, dominant left vertebral artery. 4. Suspect artifactual decreased enhancement of the left ICA origin, related to severe dental hardware artifact. Absence of any proximal left ICA stenosis could be confirmed with either carotid Doppler or neck MRA if necessary. 5. Distal right vertebral artery supplied in a retrograde fashion from the left. Intracranial atherosclerosis, but no intracranial stenosis and otherwise negative intracranial CTA. 6.  No acute intracranial abnormality.   Electronically Signed   By: Augusto Gamble M.D.   On: 08/22/2013 12:26       Recent Lab Findings: Lab Results  Component Value Date   WBC 5.9  10/21/2013   HGB 9.8* 10/21/2013   HCT 31.4* 10/21/2013   PLT 335.0 10/21/2013   GLUCOSE 105* 10/21/2013   CHOL 152 10/10/2013   TRIG 116 10/07/2013   HDL 38* 07/29/2010   LDLCALC  Value: 80        Total Cholesterol/HDL:CHD Risk Coronary Heart Disease Risk Table                     Men   Women  1/2 Average Risk   3.4   3.3  Average Risk       5.0   4.4  2 X Average Risk   9.6   7.1  3 X Average Risk  23.4   11.0        Use the calculated Patient Ratio above and the CHD Risk Table to determine the patient's CHD Risk.        ATP III CLASSIFICATION (LDL):  <100     mg/dL   Optimal  960-454  mg/dL   Near or Above                    Optimal  130-159  mg/dL   Borderline  098-119  mg/dL   High  >147     mg/dL   Very High 02/15/5620   ALT 87* 10/08/2013   AST 95* 10/08/2013   NA  140 10/21/2013   K 4.8 10/21/2013   CL 102 10/21/2013   CREATININE 0.9 10/21/2013   BUN 15 10/21/2013   CO2 30 10/21/2013   TSH 1.102 05/08/2013   INR 1.74* 10/06/2013   HGBA1C 6.1* 08/27/2013      Assessment / Plan:   Patient status post left subclavian carotid bypass ligation of subclavian artery an endovascular repair of a chronic type III aortic dissection , with previous biologic Bentall and arch replacement .  CT today confirms no evidence of endoleak and marked decrease in size of native aneurysm sac. Plan to see the patient back in 8 months with a followup CTA of the chest and abdomen to evaluate the endovascular repair of his type III aortic dissection.  Delight Ovens MD 02/12/2014 10:40 AM

## 2014-02-27 DIAGNOSIS — K219 Gastro-esophageal reflux disease without esophagitis: Secondary | ICD-10-CM | POA: Diagnosis not present

## 2014-02-27 DIAGNOSIS — Z23 Encounter for immunization: Secondary | ICD-10-CM | POA: Diagnosis not present

## 2014-02-27 DIAGNOSIS — I2510 Atherosclerotic heart disease of native coronary artery without angina pectoris: Secondary | ICD-10-CM | POA: Diagnosis not present

## 2014-02-27 DIAGNOSIS — Z0000 Encounter for general adult medical examination without abnormal findings: Secondary | ICD-10-CM | POA: Diagnosis not present

## 2014-02-27 DIAGNOSIS — I4891 Unspecified atrial fibrillation: Secondary | ICD-10-CM | POA: Diagnosis not present

## 2014-02-27 DIAGNOSIS — I4892 Unspecified atrial flutter: Secondary | ICD-10-CM | POA: Diagnosis not present

## 2014-03-12 ENCOUNTER — Ambulatory Visit: Payer: Medicare Other | Admitting: Cardiothoracic Surgery

## 2014-03-23 DIAGNOSIS — N401 Enlarged prostate with lower urinary tract symptoms: Secondary | ICD-10-CM | POA: Diagnosis not present

## 2014-03-23 DIAGNOSIS — R351 Nocturia: Secondary | ICD-10-CM | POA: Diagnosis not present

## 2014-04-10 DIAGNOSIS — I4891 Unspecified atrial fibrillation: Secondary | ICD-10-CM | POA: Diagnosis not present

## 2014-04-10 DIAGNOSIS — I4892 Unspecified atrial flutter: Secondary | ICD-10-CM | POA: Diagnosis not present

## 2014-04-10 DIAGNOSIS — I714 Abdominal aortic aneurysm, without rupture: Secondary | ICD-10-CM | POA: Diagnosis not present

## 2014-04-10 DIAGNOSIS — I2510 Atherosclerotic heart disease of native coronary artery without angina pectoris: Secondary | ICD-10-CM | POA: Diagnosis not present

## 2014-05-19 ENCOUNTER — Other Ambulatory Visit: Payer: Self-pay

## 2014-05-19 MED ORDER — APIXABAN 5 MG PO TABS: 5.0000 mg | ORAL_TABLET | Freq: Two times a day (BID) | ORAL | Status: DC

## 2014-05-21 ENCOUNTER — Telehealth: Payer: Self-pay | Admitting: *Deleted

## 2014-05-21 NOTE — Telephone Encounter (Signed)
New Message  Per [name] wife- pt received flu shot at their Family physicians office with Cheryln Manly at the end of October of this year.

## 2014-05-28 ENCOUNTER — Other Ambulatory Visit: Payer: Self-pay | Admitting: *Deleted

## 2014-05-28 MED ORDER — POTASSIUM CHLORIDE CRYS ER 20 MEQ PO TBCR: 40.0000 meq | EXTENDED_RELEASE_TABLET | Freq: Every day | ORAL | Status: DC

## 2014-06-01 ENCOUNTER — Ambulatory Visit (INDEPENDENT_AMBULATORY_CARE_PROVIDER_SITE_OTHER): Payer: Medicare Other | Admitting: Interventional Cardiology

## 2014-06-01 ENCOUNTER — Encounter: Payer: Self-pay | Admitting: Interventional Cardiology

## 2014-06-01 VITALS — BP 150/54 | HR 60 | Ht 70.0 in | Wt 179.1 lb

## 2014-06-01 DIAGNOSIS — I10 Essential (primary) hypertension: Secondary | ICD-10-CM

## 2014-06-01 DIAGNOSIS — I7100 Dissection of unspecified site of aorta: Secondary | ICD-10-CM

## 2014-06-01 DIAGNOSIS — I259 Chronic ischemic heart disease, unspecified: Secondary | ICD-10-CM

## 2014-06-01 DIAGNOSIS — I2510 Atherosclerotic heart disease of native coronary artery without angina pectoris: Secondary | ICD-10-CM

## 2014-06-01 MED ORDER — METOPROLOL TARTRATE 12.5 MG HALF TABLET: 12.5000 mg | ORAL_TABLET | Freq: Two times a day (BID) | ORAL | Status: DC

## 2014-06-01 MED ORDER — METOPROLOL TARTRATE 25 MG PO TABS: ORAL_TABLET | ORAL | Status: DC

## 2014-06-01 MED ORDER — ATORVASTATIN CALCIUM 10 MG PO TABS: 10.0000 mg | ORAL_TABLET | Freq: Every day | ORAL | Status: DC

## 2014-06-01 MED ORDER — LORATADINE 10 MG PO TABS: 10.0000 mg | ORAL_TABLET | Freq: Every day | ORAL | Status: DC

## 2014-06-01 MED ORDER — FUROSEMIDE 40 MG PO TABS: 40.0000 mg | ORAL_TABLET | Freq: Two times a day (BID) | ORAL | Status: DC

## 2014-06-01 MED ORDER — AMIODARONE HCL 200 MG PO TABS: 200.0000 mg | ORAL_TABLET | Freq: Every day | ORAL | Status: DC

## 2014-06-01 NOTE — Progress Notes (Signed)
Patient ID: [name] [name], male   DOB: May 22, 1941, 73 y.o.   MRN: [id number] Patient ID: [name] [name], male   DOB: [birth year], 73 y.o.   MRN: [id number]    [address] [place], [place]  [address] Phone: [phone] Fax:  [phone]  Date:  06/01/2014   ID:  [name] [name] Nov 01, 1940, MRN [id number]  PCP:  [name], MD      History of Present Illness:  [name] [name] is a 73 y.o. male with h/o aortic disection s/p emergent repair, CAD - DES to circumflex in 2011. 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 He was admitted to the hospital on 10/05/13 for urosepsis and was on the ventilator for 2 days.  Denies headache, palpitations, chest pain, dyspnea, orthopnea, PND, LE edema. No bleeding with Eliquis. BP is around 130/58-64 at home. Walks daily for w/o chest pain or shortness of breath. Wants to know if it is okay for him to participate in cardiac rehab.   Wt Readings from Last 3 Encounters:  06/01/14 179 lb 1.9 oz (81.248 kg)  02/12/14 167 lb (75.751 kg)  12/02/13 167 lb 12.8 oz (76.114 kg)     Past Medical History  Diagnosis Date  . Hyperlipidemia   . DVT (deep venous thrombosis)     post knee surgery  . Tobacco abuse   . HTN (hypertension)   . Descending aortic aneurysm   . Aortic valve insufficiency   . Thoracic aneurysm   . S/P AVR (aortic valve replacement)   . CAD (coronary artery disease)   . Atrial flutter   . CHF (congestive heart failure)     Current Outpatient Prescriptions  Medication Sig Dispense Refill  . amiodarone (PACERONE) 200 MG tablet Take 1 tablet (200 mg total) by mouth daily. 30 tablet 6  . apixaban (ELIQUIS) 5 MG TABS tablet Take 1 tablet (5 mg total) by mouth 2 (two) times daily. 60 tablet 6  . atorvastatin (LIPITOR) 10 MG tablet Take 1 tablet (10 mg total) by mouth daily. 90 tablet 3  . furosemide (LASIX) 40 MG tablet Take 1 tablet (40 mg total) by mouth 2  (two) times daily. 60 tablet 11  . lansoprazole (PREVACID) 30 MG capsule Take 30 mg by mouth daily.     Marland Kitchen loratadine (CLARITIN) 10 MG tablet Take 10 mg by mouth daily.     . metoprolol tartrate (LOPRESSOR) 12.5 mg TABS tablet Take 0.5 tablets (12.5 mg total) by mouth 2 (two) times daily. 60 tablet 1  . Multiple Vitamin (MULTIVITAMIN WITH MINERALS) TABS tablet Take 1 tablet by mouth daily.    . nitroGLYCERIN (NITROSTAT) 0.4 MG SL tablet Place 1 tablet (0.4 mg total) under the tongue every 5 (five) minutes as needed. 25 tablet 6  . potassium chloride SA (K-DUR,KLOR-CON) 20 MEQ tablet Take 2 tablets (40 mEq total) by mouth daily. 60 tablet 11  . tamsulosin (FLOMAX) 0.4 MG CAPS capsule Take 0.4 mg by mouth 2 (two) times daily.      No current facility-administered medications for this visit.    Allergies:    Allergies  Allergen Reactions  . Lortab [Hydrocodone-Acetaminophen] Hives  . Morphine And Related Hives    Social History:  The patient  reports that he quit smoking about 40 years ago. His smoking use included Cigarettes. He smoked 0.00 packs per day. He has never used smokeless tobacco. He reports that he does not drink  alcohol or use illicit drugs.   Family History:  The patient's family history includes Celiac disease in his daughter.   ROS:  Please see the history of present illness.  No nausea, vomiting.  No fevers, chills.  No focal weakness.  No dysuria. Getting stronger   All other systems reviewed and negative.   PHYSICAL EXAM: VS:  BP 150/54 mmHg  Pulse 60  Ht 5\' 10"  (1.778 m)  Wt 179 lb 1.9 oz (81.248 kg)  BMI 25.70 kg/m2 Well nourished, well developed, in no acute distress HEENT: normal Neck: no JVD, left carotid bruit Cardiac:  normal S1, S2; RRR; 2/6 systolic murmur Lungs:  clear to auscultation bilaterally, no wheezing, rhonchi or rales Abd: soft, nontender, no hepatomegaly Ext: no edema Skin: warm and dry Neuro:   no focal abnormalities noted Psych: normal  affect       ASSESSMENT AND PLAN:  1) Aortic dissection: Continue aggressive blood pressure control. Blood pressure is very well controlled. S/p stent to thoracic aorta and carotid-subclavian bypass  2). atrial fibrillation: Continue anticoagulation for stroke prevention. Continue amiodarone for rhythm control. No signs of bradycardia. Back on Eliquis. Appears to be in NSR at this time.  3) CAD:  No angina. lipid test soon.  OK for cardiac rehab. On apixaban.  No bleeding problems.     Signed, Fredric MareJay S. Anwen Cannedy, MD, [facility] 06/01/2014 2:01 PM

## 2014-06-01 NOTE — Patient Instructions (Signed)
Your physician recommends that you continue on your current medications as directed. Please refer to the Current Medication list given to you today.  Your physician wants you to follow-up in: 9 month with [name]. You will receive a reminder letter in the mail two months in advance. If you don't receive a letter, please call our office to schedule the follow-up appointment.

## 2014-06-05 ENCOUNTER — Telehealth: Payer: Self-pay | Admitting: Interventional Cardiology

## 2014-06-05 NOTE — Telephone Encounter (Signed)
New Message  Per Pt wife- Pt on eliquis- bled this morning no obvious cut - compression, elevation, ice. Pt wife wants to know if pt can take Morning eliquis? Or not. Phone call was sent to Triage. Please call back and discuss.

## 2014-06-05 NOTE — Telephone Encounter (Signed)
Spoke with patient's wife this am. Apparently patient went out for a walk this am. He returned with a bleeding foot, with no apparent injury. Wife said he bled "a dinner plate full" of blood. She cleaned the area, wrapped it and put ice on it. Bleeding stopped. Her question is re; Eliquis. Give the am dose or hold it? Per Dr. Anne Fu, DOD, he should hold am dose, then resume it this pm. She voices good understanding.

## 2014-09-02 ENCOUNTER — Other Ambulatory Visit: Payer: Self-pay | Admitting: *Deleted

## 2014-09-02 DIAGNOSIS — I7100 Dissection of unspecified site of aorta: Secondary | ICD-10-CM

## 2014-09-23 ENCOUNTER — Other Ambulatory Visit: Payer: Self-pay

## 2014-09-23 MED ORDER — METOPROLOL TARTRATE 25 MG PO TABS: ORAL_TABLET | ORAL | Status: DC

## 2014-09-28 DIAGNOSIS — R351 Nocturia: Secondary | ICD-10-CM | POA: Diagnosis not present

## 2014-09-28 DIAGNOSIS — R972 Elevated prostate specific antigen [PSA]: Secondary | ICD-10-CM | POA: Diagnosis not present

## 2014-09-28 DIAGNOSIS — N401 Enlarged prostate with lower urinary tract symptoms: Secondary | ICD-10-CM | POA: Diagnosis not present

## 2014-10-01 ENCOUNTER — Encounter: Payer: Self-pay | Admitting: Cardiothoracic Surgery

## 2014-10-01 DIAGNOSIS — I7100 Dissection of unspecified site of aorta: Secondary | ICD-10-CM | POA: Diagnosis not present

## 2014-10-15 ENCOUNTER — Other Ambulatory Visit: Payer: Medicare Other

## 2014-10-15 ENCOUNTER — Ambulatory Visit
Admission: RE | Admit: 2014-10-15 | Discharge: 2014-10-15 | Disposition: A | Discharge status: Home / Self Care | Payer: Medicare Other | Source: Ambulatory Visit | Attending: Cardiothoracic Surgery | Admitting: Cardiothoracic Surgery

## 2014-10-15 ENCOUNTER — Encounter: Payer: Self-pay | Admitting: Cardiothoracic Surgery

## 2014-10-15 ENCOUNTER — Ambulatory Visit (INDEPENDENT_AMBULATORY_CARE_PROVIDER_SITE_OTHER): Payer: Medicare Other | Admitting: Cardiothoracic Surgery

## 2014-10-15 ENCOUNTER — Other Ambulatory Visit: Payer: Self-pay | Admitting: Cardiothoracic Surgery

## 2014-10-15 ENCOUNTER — Other Ambulatory Visit (HOSPITAL_COMMUNITY): Payer: Self-pay | Admitting: Interventional Radiology

## 2014-10-15 VITALS — BP 147/57 | HR 60 | Resp 20 | Ht 70.0 in | Wt 185.0 lb

## 2014-10-15 DIAGNOSIS — Z452 Encounter for adjustment and management of vascular access device: Secondary | ICD-10-CM

## 2014-10-15 DIAGNOSIS — I719 Aortic aneurysm of unspecified site, without rupture: Secondary | ICD-10-CM | POA: Diagnosis not present

## 2014-10-15 DIAGNOSIS — I7100 Dissection of unspecified site of aorta: Secondary | ICD-10-CM

## 2014-10-15 DIAGNOSIS — I878 Other specified disorders of veins: Secondary | ICD-10-CM

## 2014-10-15 DIAGNOSIS — T82598A Other mechanical complication of other cardiac and vascular devices and implants, initial encounter: Secondary | ICD-10-CM | POA: Diagnosis not present

## 2014-10-15 DIAGNOSIS — I712 Thoracic aortic aneurysm, without rupture: Secondary | ICD-10-CM | POA: Diagnosis not present

## 2014-10-15 DIAGNOSIS — Z9889 Other specified postprocedural states: Secondary | ICD-10-CM | POA: Diagnosis not present

## 2014-10-15 MED ORDER — IOPAMIDOL (ISOVUE-370) INJECTION 76%
75.0000 mL | Freq: Once | INTRAVENOUS | Status: AC | PRN
  Administered 2014-10-15: 75 mL via INTRAVENOUS

## 2014-10-15 NOTE — Progress Notes (Signed)
Patient ID: [name] [name], male   DOB: 1941/01/11, 74 y.o.   MRN: [id number]          [name] [name] [facility] Medical Record #[id number] Date of Birth: [birth year]  Referring: [name] Crafts, MD Primary Care: [name] Pearson, MD  Chief Complaint:    Chief Complaint  Patient presents with  . 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  SURGICAL PROCEDURE: Left subclavian to carotid bypass with 8 mm graft  and ligation of proximal left subclavian artery. Endovascular repair of  descending thoracic aorta with coverage of the left subclavian and  distal extension with a proximal 40 x 40 x 20 GORE-TEX device with a  second extension 45 x 45 x 15 cm and aortogram x3.  2010- aortic valve replacement replacement of aortic root and ascending aorta and replacement of aortic arch to the left subclavian artery.  History of Present Illness:     Patient is a 74 year old male who on 02/19/2009 presented with a large ascending aneurysm and acute aortic insufficiency and aortic arch aneurysm and at that time underwent aortic valve replacement replacement of aortic root and ascending aorta and replacement of aortic arch to the left subclavian artery. Since that time he's had an acute myocardial infarction and had a stent placed. In November 2014 prior to his yearly followup [facility] with an acute type III aortic dissection. He was treated medically, there is hospital stay he did develop persistent atrial fibrillation and ultimately was discharged home on anticoagulation.   Prior to his followup scan for the type III aortic dissection he was really admitted with signs and symptoms of  congestive heart failure followup scan showed significant enlargement of the type III aortic dissection. March 12 he underwent left subclavian to carotid bypass ligation of the proximal left subclavian artery an endovascular repair of descending thoracic aorta with coverage of the left subclavian artery. \   Since last seen the patient and his wife both note that he's been doing better than he has for several years. His urinary problems have been straightened out.  Current Activity/ Functional Status: Mobility/Ambulation: Independent with mobility prior to admission in the home on all surfaces with no assistive devices for unlimited distances.   Zubrod Score: At the time of surgery this patient's most appropriate activity status/level should be described as: []  Normal activity, no symptoms []  Symptoms, fully ambulatory [x]  Symptoms, in bed less than or equal to 50% of the time []  Symptoms, in bed greater than 50% of the time but less than 100% []  Bedridden []  Moribund   Past Medical History  Diagnosis Date  . Hyperlipidemia   . DVT (deep venous thrombosis)     post knee surgery  . Tobacco abuse   . HTN (hypertension)   . Descending aortic aneurysm   . Aortic valve insufficiency   . Thoracic aneurysm   . S/P AVR (aortic valve replacement)   . CAD (coronary artery disease)   . Atrial flutter   . CHF (congestive heart failure)     Past Surgical History  Procedure Laterality Date  . Right groin lymphocele  [id number]  . Aortic valve replacement  [id number]  . Replacement total knee  bilateral  . Carotid-subclavian bypass graft Left 08/26/2013    Procedure: BYPASS GRAFT CAROTID-SUBCLAVIAN;  Surgeon: Nada Libman, MD;  Location: [facility] OR;  Service: Vascular;  Laterality: Left;  . Endovascular stent insertion N/A 08/26/2013    Procedure:  THORACIC STENT GRAFT INSERTION;  Surgeon: Nada Libman, MD;  Location: [facility] OR;  Service: Vascular;  Laterality: N/A;    History  Smoking  status  . Former Smoker  . Types: Cigarettes  . Quit date: 04/16/1974  Smokeless tobacco  . Never Used    History  Alcohol Use No      Allergies  Allergen Reactions  . Lortab [Hydrocodone-Acetaminophen] Hives  . Morphine And Related Hives    Current Outpatient Prescriptions  Medication Sig Dispense Refill  . amiodarone (PACERONE) 200 MG tablet Take 1 tablet (200 mg total) by mouth daily. 30 tablet 11  . apixaban (ELIQUIS) 5 MG TABS tablet Take 1 tablet (5 mg total) by mouth 2 (two) times daily. 60 tablet 6  . atorvastatin (LIPITOR) 10 MG tablet Take 1 tablet (10 mg total) by mouth daily. 30 tablet 11  . furosemide (LASIX) 40 MG tablet Take 1 tablet (40 mg total) by mouth 2 (two) times daily. 60 tablet 11  . lansoprazole (PREVACID) 30 MG capsule Take 30 mg by mouth daily.     Marland Kitchen loratadine (CLARITIN) 10 MG tablet Take 1 tablet (10 mg total) by mouth daily. 30 tablet 11  . metoprolol tartrate (LOPRESSOR) 25 MG tablet Take half a tablet (12.5 mg) twice daily 30 tablet 6  . Multiple Vitamin (MULTIVITAMIN WITH MINERALS) TABS tablet Take 1 tablet by mouth daily.    . nitroGLYCERIN (NITROSTAT) 0.4 MG SL tablet Place 1 tablet (0.4 mg total) under the tongue every 5 (five) minutes as needed. 25 tablet 6  . potassium chloride SA (K-DUR,KLOR-CON) 20 MEQ tablet Take 2 tablets (40 mEq total) by mouth daily. 60 tablet 11  . tamsulosin (FLOMAX) 0.4 MG CAPS capsule Take 0.4 mg by mouth 2 (two) times daily.      No current facility-administered medications for this visit.      Physical Exam: BP 147/57 mmHg  Pulse 60  Resp 20  Ht 5\' 10"  (1.778 m)  Wt 185 lb (83.915 kg)  BMI 26.54 kg/m2  SpO2 97%  General appearance: alert, cooperative, appears older than stated age, fatigued, no distress and slowed mentation Neurologic: intact Heart: regular rate and rhythm, S1, S2 normal, no murmur, click, rub or gallop, normal apical impulse, no click and no rub Lungs: clear to auscultation  bilaterally Abdomen: soft, non-tender; bowel sounds normal; no masses,  no organomegaly Extremities: extremities normal, atraumatic, no cyanosis or edema, mild  edema, redness or tenderness in the calves or thighs and no ulcers, gangrene or trophic changes Wound: The left neck incision is well-healed, has a strong probable left brachial and radial pulses    Diagnostic Studies & Laboratory data:     Recent Radiology Findings:  ICt Angio Chest Aorta W/cm &/or Wo/cm  10/15/2014   CLINICAL DATA:  Thoracic aortic stent graft  EXAM: CT ANGIOGRAPHY CHEST WITH CONTRAST  TECHNIQUE: Multidetector CT imaging of the chest was performed using the standard protocol during bolus administration of intravenous contrast. Multiplanar CT image reconstructions and MIPs were obtained to evaluate the vascular anatomy.  CONTRAST:  75 cc Isovue 370  COMPARISON:  02/12/2014  FINDINGS: Chest:  Thoracic aortic stent graft is stable in position. It begins just distal to the takeoff of the  innominate artery with a bovine configuration. There is no evidence of endoleak. Maximal aneurysm sac diameter on image 43 of series 5 measures 5.3 cm. This is improved compared to my direct measurements on the prior study. At that time, I measure a diameter of 5.6 cm. There is calcification between the innominate artery and aortic arch on image 31 of series 5.  There is persistent mild narrowing at the origin of the innominate artery just above the takeoff of the left common carotid artery. 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 This is unchanged. Right subclavian and common carotid arteries are patent. Origin of the right vertebral artery fills with contrast but then is non-opacified. Left common carotid artery is patent. Proximal left subclavian artery has been ligated. Left carotid to left subclavian bypass graft is patent. Left vertebral artery is dominant and patent. Left subclavian artery is patent.  Aortic valve  replacement is visualized. The aortic root and ascending aorta are have a normal appearance without evidence of dissection. Maximal diameters 3.3 cm.  No obvious acute pulmonary thromboembolism.  Stable 5 mm right upper lobe pulmonary nodule on image 57 of series 602. Minimal dependent atelectasis  No pneumothorax or pleural effusion.  Cholelithiasis  Review of the MIP images confirms the above findings.  IMPRESSION: Stable position of the thoracic aortic stent graft. There is no evidence of endoleak and aneurysm sac diameter has diminished from 5.6 cm to 5.3 cm.  There is poor filling of the right vertebral artery. Early subclavian steal syndrome cannot be excluded in this patient suggesting significant narrowing of the right subclavian artery circuit. This could conceivably occur at its origin at the level of the stent graft. Carotid Doppler study with evaluation of vertebral artery flow was recommended. There is no obvious area of narrowing on this study.   Electronically Signed   By: Jolaine Click M.D.   On: 10/15/2014 14:39    Ct Angio Chest Aorta W/cm &/or Wo/cm  02/12/2014   CLINICAL DATA:  Evaluate aortic dissection. Followup thoracic aortic aneurysm. Previous aortic valve replacement with thoracic aortic aneurysm stent graft.  EXAM: CT ANGIOGRAPHY CHEST AND ABDOMEN  TECHNIQUE: Multidetector CT imaging of the chest and abdomen was performed using the standard protocol during bolus administration of intravenous contrast. Multiplanar CT image reconstructions and MIPs were obtained to evaluate the vascular anatomy.  CONTRAST:  80mL OMNIPAQUE IOHEXOL 350 MG/ML SOLN  COMPARISON:  10/06/2013, 08/22/2013 as well as 05/18/2011  FINDINGS: CTA CHEST FINDINGS  Sternotomy wires are present. The lungs are adequately inflated without consolidation or effusion. There is very minimal apical emphysematous disease. There is a 7-8 mm nodule over the anterior right upper lobe stable since 2012. There is moderate stable  cardiomegaly. No change and coronary artery calcifications with suggestion of stent over the lateral circumflex artery. Evidence of previous aortic valve replacement. No pericardial or mediastinal fluid collection. No mediastinal or hilar adenopathy.  The posterior arch/descending thoracic aortic stent graft is unchanged which excludes the previously noted aneurysm/dissection. No current evidence of a dissection. Significantly less the low density thrombus in the native aneurysm sac. No evidence of endoleak. 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  Review of the MIP images confirms the above findings.  CTA ABDOMEN FINDINGS  Abdominal images demonstrate minimal cholelithiasis. The liver, spleen, pancreas and adrenal glands are within normal. Kidneys are normal in size without hydronephrosis or nephrolithiasis. No change  in a 3.4 cm cyst over the upper pole right kidney. Few other smaller subcentimeter renal cortical hypodensities likely cysts but too small to characterize. There is diverticulosis of the colon. Appendix is within normal. Visualize ureters are within normal.  There is mild calcified plaque over the abdominal aorta. There is no evidence of abdominal aortic dissection. There is mild aneurysmal dilatation of the infrarenal abdominal aorta measuring 3.0 cm in AP diameter just below the take-off of the renal arteries. The celiac axis, superior and inferior mesenteric arteries are patent. Single bilateral renal arteries are patent with mild focal narrowing at the origin of the right renal artery. There are degenerative changes of the spine.  Review of the MIP images confirms the above findings.  IMPRESSION: Stable aortic stent graft over the aortic arch and descending thoracic aorta with significant reduction in the mural thrombus of the native aneurysm sac. No evidence of dissection or endoleak.  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 No evidence of abdominal aortic dissection.  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  Stable moderate cardiomegaly with atherosclerotic coronary artery disease and left lateral circumflex stent. Previous median sternotomy and aortic valve replacement.  Mild cholelithiasis.  Bilateral stable renal cysts with the largest measuring 3.4 cm over the upper pole right kidney.  Diverticulosis of the colon.   Electronically Signed   By: Elberta Fortis M.D.   On: 02/12/2014 10:02    Ct Angio Head W/cm &/or Wo Cm  08/22/2013   CLINICAL DATA:  74 year old male with history of thoracic aneurysm status post repair. Progressive weight gain, swelling and shortness of Breath. Recently thought to have sinus infection, treated with antibiotics and steroids. Symptoms increasing. Initial encounter.  EXAM: CT ANGIOGRAPHY HEAD AND NECK  TECHNIQUE: Multidetector CT imaging of the head and neck was performed using the standard protocol during bolus administration of intravenous contrast. Multiplanar CT image reconstructions and MIPs were obtained to evaluate the vascular anatomy. Carotid stenosis measurements (when applicable) are obtained utilizing NASCET criteria, using the distal internal carotid diameter as the denominator.  CONTRAST:  50mL OMNIPAQUE IOHEXOL 350 MG/ML SOLN  COMPARISON:  Chest abdomen and pelvis CTA from the same day reported separately.  FINDINGS: CTA HEAD FINDINGS  Calvarium intact. Negative scalp soft tissues. Cerebral volume is within normal limits for age. No midline shift, ventriculomegaly, mass effect, evidence of mass lesion, intracranial hemorrhage or evidence of cortically based acute infarction. Gray-white matter differentiation is within normal limits throughout the brain. No abnormal enhancement identified.  VASCULAR FINDINGS:  Major intracranial venous structures are enhancing.   Dominant distal left vertebral artery is patent without stenosis despite some calcified plaque. Retrograde supply to the non dominant distal right vertebral artery. Right PICA and AICA are patent. Dominant left AICA. The basilar artery is patent without stenosis. SCA and PCA origins are within normal limits. Normal left posterior communicating artery, the right is diminutive or absent. Bilateral PCA branches are within normal limits.  Some cavernous sinus venous contrast artifact is present. Both ICA siphons are patent. Ophthalmic and posterior communicating arteries are within normal limits. Normal carotid termini. Normal MCA and ACA origins. Anterior communicating artery and bilateral ACA branches are within normal limits. Bilateral MCA branches are within normal limits.  Review of the MIP images confirms the above findings.  CTA NECK FINDINGS  Chest findings are reported separately (please see that report). Negative thyroid, larynx, pharynx, parapharyngeal spaces, retropharyngeal space. Extensive dental hardware  artifact. Negative visualized sublingual space, submandibular glands and submandibular space. Negative parotid glands. No acute orbits soft tissue findings.  No cervical lymphadenopathy. Mucous retention cyst left maxillary sinus. Other Visualized paranasal sinuses and mastoids are clear. No acute osseous abnormality identified. Degenerative changes in the cervical spine.  VASCULAR FINDINGS:  SEE CHEST CTA FINDINGS FROM TODAY REPORTED SEPARATELY.  Both internal jugular veins are patent. The left subclavian and innominate vein are patent. Negative non contrast appearance of the right subclavian. The right innominate vein is patent.  Great vessel origins are patent, dolichoectatic. Widespread mild soft and calcified circumferential plaque in the great vessels.  Patent, mildly dolichoectatic right CCA. Patent right carotid bifurcation with no right ICA stenosis despite calcified plaque. Tortuous, mildly  dolichoectatic cervical right ICA.  No proximal right subclavian artery stenosis, with some dolichoectasia. Non dominant right vertebral artery is occluded at its origin. Distal right vertebral artery flow is reconstituted briefly at the C2-C3 level, but then re- occludes below the skullbase. Intracranial findings detailed above.  Patent, the dolichoectatic left CCA. Calcified plaque at the left carotid bifurcation which is significantly affected by dental streak artifact. Loss of enhancement in the left ICA origin and bulb felt to be artifactual (see series 12, image 172). Tortuous/dolichoectatic but otherwise negative cervical left ICA be on that level.  No proximal left subclavian artery stenosis. Dominant left vertebral artery origin widely patent. Tortuous proximal left vertebral artery. Dominant left vertebral artery tortuous and patent to the skullbase.  Review of the MIP images confirms the above findings.  IMPRESSION: 1. Abnormal thoracic aorta, see chest CTA from today reported separately. 2. Patent, dolichoectatic great vessels without arterial dissection in the neck. 3. Occluded non dominant right vertebral artery. Patent, dominant left vertebral artery. 4. Suspect artifactual decreased enhancement of the left ICA origin, related to severe dental hardware artifact. Absence of any proximal left ICA stenosis could be confirmed with either carotid Doppler or neck MRA if necessary. 5. Distal right vertebral artery supplied in a retrograde fashion from the left. Intracranial atherosclerosis, but no intracranial stenosis and otherwise negative intracranial CTA. 6.  No acute intracranial abnormality.   Electronically Signed   By: Augusto Gamble M.D.   On: 08/22/2013 12:26       Recent Lab Findings: Lab Results  Component Value Date   WBC 5.9 10/21/2013   HGB 9.8* 10/21/2013   HCT 31.4* 10/21/2013   PLT 335.0 10/21/2013   GLUCOSE 105* 10/21/2013   CHOL 152 10/10/2013   TRIG 116 10/07/2013   HDL 38*  07/29/2010   LDLCALC  07/29/2010    80        Total Cholesterol/HDL:CHD Risk Coronary Heart Disease Risk Table                     Men   Women  1/2 Average Risk   3.4   3.3  Average Risk       5.0   4.4  2 X Average Risk   9.6   7.1  3 X Average Risk  23.4   11.0        Use the calculated Patient Ratio above and the CHD Risk Table to determine the patient's CHD Risk.        ATP III CLASSIFICATION (LDL):  <100     mg/dL   Optimal  937-902  mg/dL   Near or Above  Optimal  130-159  mg/dL   Borderline  409-811  mg/dL   High  >914     mg/dL   Very High   ALT 87* 78/29/5621   AST 95* 10/08/2013   NA 140 10/21/2013   K 4.8 10/21/2013   CL 102 10/21/2013   CREATININE 0.9 10/21/2013   BUN 15 10/21/2013   CO2 30 10/21/2013   TSH 1.102 05/08/2013   INR 1.74* 10/06/2013   HGBA1C 6.1* 08/27/2013      Assessment / Plan:   Patient status post left subclavian carotid bypass,  ligation of subclavian artery and endovascular repair of a chronic type III aortic dissection , with previous biologic Bentall and arch replacement . Stable appearance of thoracic aortic stent Repeat CTA of chest one year  Delight Ovens MD 10/15/2014 4:39 PM

## 2014-11-25 ENCOUNTER — Encounter: Payer: Self-pay | Admitting: Cardiothoracic Surgery

## 2014-12-14 ENCOUNTER — Other Ambulatory Visit: Payer: Self-pay | Admitting: Interventional Cardiology

## 2014-12-14 ENCOUNTER — Other Ambulatory Visit: Payer: Self-pay

## 2014-12-14 MED ORDER — APIXABAN 5 MG PO TABS: 5.0000 mg | ORAL_TABLET | Freq: Two times a day (BID) | ORAL | Status: DC

## 2015-01-15 ENCOUNTER — Telehealth: Payer: Self-pay | Admitting: Interventional Cardiology

## 2015-01-15 NOTE — Telephone Encounter (Signed)
Attempted to call back.  Phone rang several times with no answer and no voicemail.  Unable to leave message.  Will try again later.  Pt does not need to hold Eliquis for cleaning per Ward Givens, NP.

## 2015-01-15 NOTE — Telephone Encounter (Signed)
Informed Eliquis does not need to be held for cleaning. Verbalized understanding and was in agreement with this plan.

## 2015-01-15 NOTE — Telephone Encounter (Signed)
Per wife:    Pt has a dentist apt 8/25 needs to know what he should do about his Eliquis befor the cleaning.

## 2015-01-15 NOTE — Telephone Encounter (Signed)
Follow up  ° ° °Patient returning call back to nurse  °

## 2015-01-28 ENCOUNTER — Encounter (HOSPITAL_COMMUNITY): Payer: Self-pay | Admitting: Emergency Medicine

## 2015-01-28 ENCOUNTER — Observation Stay (HOSPITAL_COMMUNITY)
Admission: EM | Admit: 2015-01-28 | Discharge: 2015-01-29 | Disposition: A | Discharge status: Home / Self Care | Payer: Medicare Other | Attending: Internal Medicine | Admitting: Internal Medicine

## 2015-01-28 ENCOUNTER — Emergency Department (HOSPITAL_COMMUNITY): Payer: Medicare Other

## 2015-01-28 DIAGNOSIS — I5032 Chronic diastolic (congestive) heart failure: Secondary | ICD-10-CM

## 2015-01-28 DIAGNOSIS — Z79899 Other long term (current) drug therapy: Secondary | ICD-10-CM | POA: Insufficient documentation

## 2015-01-28 DIAGNOSIS — I480 Paroxysmal atrial fibrillation: Secondary | ICD-10-CM | POA: Insufficient documentation

## 2015-01-28 DIAGNOSIS — I10 Essential (primary) hypertension: Secondary | ICD-10-CM | POA: Diagnosis not present

## 2015-01-28 DIAGNOSIS — R079 Chest pain, unspecified: Principal | ICD-10-CM | POA: Insufficient documentation

## 2015-01-28 DIAGNOSIS — E785 Hyperlipidemia, unspecified: Secondary | ICD-10-CM | POA: Diagnosis not present

## 2015-01-28 DIAGNOSIS — R404 Transient alteration of awareness: Secondary | ICD-10-CM | POA: Diagnosis not present

## 2015-01-28 DIAGNOSIS — K8020 Calculus of gallbladder without cholecystitis without obstruction: Secondary | ICD-10-CM | POA: Diagnosis not present

## 2015-01-28 DIAGNOSIS — Z952 Presence of prosthetic heart valve: Secondary | ICD-10-CM | POA: Diagnosis not present

## 2015-01-28 DIAGNOSIS — I4891 Unspecified atrial fibrillation: Secondary | ICD-10-CM | POA: Diagnosis present

## 2015-01-28 DIAGNOSIS — R61 Generalized hyperhidrosis: Secondary | ICD-10-CM | POA: Diagnosis not present

## 2015-01-28 DIAGNOSIS — Z7902 Long term (current) use of antithrombotics/antiplatelets: Secondary | ICD-10-CM | POA: Insufficient documentation

## 2015-01-28 DIAGNOSIS — Z5181 Encounter for therapeutic drug level monitoring: Secondary | ICD-10-CM | POA: Insufficient documentation

## 2015-01-28 DIAGNOSIS — I2510 Atherosclerotic heart disease of native coronary artery without angina pectoris: Secondary | ICD-10-CM | POA: Diagnosis not present

## 2015-01-28 DIAGNOSIS — I25110 Atherosclerotic heart disease of native coronary artery with unstable angina pectoris: Secondary | ICD-10-CM | POA: Diagnosis not present

## 2015-01-28 DIAGNOSIS — R42 Dizziness and giddiness: Secondary | ICD-10-CM | POA: Insufficient documentation

## 2015-01-28 DIAGNOSIS — I4892 Unspecified atrial flutter: Secondary | ICD-10-CM | POA: Insufficient documentation

## 2015-01-28 DIAGNOSIS — Z86718 Personal history of other venous thrombosis and embolism: Secondary | ICD-10-CM | POA: Diagnosis not present

## 2015-01-28 DIAGNOSIS — I209 Angina pectoris, unspecified: Secondary | ICD-10-CM

## 2015-01-28 DIAGNOSIS — R0602 Shortness of breath: Secondary | ICD-10-CM | POA: Insufficient documentation

## 2015-01-28 DIAGNOSIS — I77811 Abdominal aortic ectasia: Secondary | ICD-10-CM | POA: Diagnosis not present

## 2015-01-28 DIAGNOSIS — Z87891 Personal history of nicotine dependence: Secondary | ICD-10-CM | POA: Insufficient documentation

## 2015-01-28 DIAGNOSIS — Z885 Allergy status to narcotic agent status: Secondary | ICD-10-CM | POA: Insufficient documentation

## 2015-01-28 DIAGNOSIS — R0789 Other chest pain: Secondary | ICD-10-CM | POA: Diagnosis not present

## 2015-01-28 DIAGNOSIS — Z954 Presence of other heart-valve replacement: Secondary | ICD-10-CM

## 2015-01-28 DIAGNOSIS — R55 Syncope and collapse: Secondary | ICD-10-CM | POA: Insufficient documentation

## 2015-01-28 DIAGNOSIS — I252 Old myocardial infarction: Secondary | ICD-10-CM | POA: Insufficient documentation

## 2015-01-28 DIAGNOSIS — I712 Thoracic aortic aneurysm, without rupture: Secondary | ICD-10-CM | POA: Diagnosis not present

## 2015-01-28 DIAGNOSIS — I7100 Dissection of unspecified site of aorta: Secondary | ICD-10-CM | POA: Diagnosis present

## 2015-01-28 HISTORY — DX: Syncope and collapse: R55

## 2015-01-28 HISTORY — DX: Unspecified osteoarthritis, unspecified site: M19.90

## 2015-01-28 HISTORY — DX: Chronic diastolic (congestive) heart failure: I50.32

## 2015-01-28 HISTORY — DX: Personal history of other medical treatment: Z92.89

## 2015-01-28 HISTORY — DX: Chest pain, unspecified: R07.9

## 2015-01-28 LAB — COMPREHENSIVE METABOLIC PANEL
ALT: 18 U/L (ref 17–63)
AST: 27 U/L (ref 15–41)
Albumin: 3.9 g/dL (ref 3.5–5.0)
Alkaline Phosphatase: 58 U/L (ref 38–126)
Anion gap: 8 (ref 5–15)
BUN: 20 mg/dL (ref 6–20)
CALCIUM: 8.8 mg/dL — AB (ref 8.9–10.3)
CO2: 29 mmol/L (ref 22–32)
CREATININE: 1.19 mg/dL (ref 0.61–1.24)
Chloride: 106 mmol/L (ref 101–111)
GFR, EST NON AFRICAN AMERICAN: 58 mL/min — AB (ref >60)
GLUCOSE: 146 mg/dL — AB (ref 65–99)
Potassium: 4.4 mmol/L (ref 3.5–5.1)
SODIUM: 143 mmol/L (ref 135–145)
Total Bilirubin: 0.8 mg/dL (ref 0.3–1.2)
Total Protein: 6.6 g/dL (ref 6.5–8.1)

## 2015-01-28 LAB — CBC WITH DIFFERENTIAL/PLATELET
BASOS PCT: 0 % (ref 0–1)
Basophils Absolute: 0 10*3/uL (ref 0.0–0.1)
EOS ABS: 0.1 10*3/uL (ref 0.0–0.7)
EOS PCT: 1 % (ref 0–5)
HCT: 33.8 % — ABNORMAL LOW (ref 39.0–52.0)
HEMOGLOBIN: 10.5 g/dL — AB (ref 13.0–17.0)
LYMPHS ABS: 0.5 10*3/uL — AB (ref 0.7–4.0)
Lymphocytes Relative: 8 % — ABNORMAL LOW (ref 12–46)
MCH: 27.6 pg (ref 26.0–34.0)
MCHC: 31.1 g/dL (ref 30.0–36.0)
MCV: 88.9 fL (ref 78.0–100.0)
MONOS PCT: 5 % (ref 3–12)
Monocytes Absolute: 0.3 10*3/uL (ref 0.1–1.0)
Neutro Abs: 5.5 10*3/uL (ref 1.7–7.7)
Neutrophils Relative %: 86 % — ABNORMAL HIGH (ref 43–77)
Platelets: 203 10*3/uL (ref 150–400)
RBC: 3.8 MIL/uL — AB (ref 4.22–5.81)
RDW: 16 % — ABNORMAL HIGH (ref 11.5–15.5)
WBC: 6.3 10*3/uL (ref 4.0–10.5)

## 2015-01-28 LAB — I-STAT TROPONIN, ED: Troponin i, poc: 0 ng/mL (ref 0.00–0.08)

## 2015-01-28 LAB — PROTIME-INR
INR: 1.42 (ref 0.00–1.49)
Prothrombin Time: 17.5 seconds — ABNORMAL HIGH (ref 11.6–15.2)

## 2015-01-28 LAB — TROPONIN I: Troponin I: <0.03 ng/mL (ref <0.031)

## 2015-01-28 LAB — BRAIN NATRIURETIC PEPTIDE: B NATRIURETIC PEPTIDE 5: 278.9 pg/mL — AB (ref 0.0–100.0)

## 2015-01-28 LAB — HEPARIN LEVEL (UNFRACTIONATED)

## 2015-01-28 MED ORDER — PANTOPRAZOLE SODIUM 40 MG PO TBEC: 40.0000 mg | DELAYED_RELEASE_TABLET | Freq: Every day | ORAL | Status: DC

## 2015-01-28 MED ORDER — METOPROLOL TARTRATE 12.5 MG HALF TABLET
12.5000 mg | ORAL_TABLET | Freq: Two times a day (BID) | ORAL | Status: DC
  Administered 2015-01-28 – 2015-01-29 (×2): 12.5 mg via ORAL
  Filled 2015-01-28 (×3): qty 1

## 2015-01-28 MED ORDER — NITROGLYCERIN 0.4 MG SL SUBL: 0.4000 mg | SUBLINGUAL_TABLET | SUBLINGUAL | Status: DC | PRN

## 2015-01-28 MED ORDER — LORATADINE 10 MG PO TABS
10.0000 mg | ORAL_TABLET | Freq: Every day | ORAL | Status: DC
  Administered 2015-01-29: 10 mg via ORAL
  Filled 2015-01-28: qty 1

## 2015-01-28 MED ORDER — ADULT MULTIVITAMIN W/MINERALS CH
1.0000 | ORAL_TABLET | Freq: Every day | ORAL | Status: DC
  Administered 2015-01-29: 1 via ORAL
  Filled 2015-01-28: qty 1

## 2015-01-28 MED ORDER — ATORVASTATIN CALCIUM 10 MG PO TABS
10.0000 mg | ORAL_TABLET | Freq: Every day | ORAL | Status: DC
  Administered 2015-01-29: 10 mg via ORAL
  Filled 2015-01-28: qty 1

## 2015-01-28 MED ORDER — AMIODARONE HCL 200 MG PO TABS
200.0000 mg | ORAL_TABLET | Freq: Every day | ORAL | Status: DC
  Administered 2015-01-29: 200 mg via ORAL
  Filled 2015-01-28: qty 1

## 2015-01-28 MED ORDER — POTASSIUM CHLORIDE CRYS ER 20 MEQ PO TBCR
40.0000 meq | EXTENDED_RELEASE_TABLET | Freq: Every day | ORAL | Status: DC
  Administered 2015-01-29: 40 meq via ORAL
  Filled 2015-01-28: qty 2

## 2015-01-28 MED ORDER — TAMSULOSIN HCL 0.4 MG PO CAPS
0.4000 mg | ORAL_CAPSULE | Freq: Two times a day (BID) | ORAL | Status: DC
  Administered 2015-01-28 – 2015-01-29 (×2): 0.4 mg via ORAL
  Filled 2015-01-28 (×3): qty 1

## 2015-01-28 MED ORDER — SODIUM CHLORIDE 0.9 % IJ SOLN
3.0000 mL | Freq: Two times a day (BID) | INTRAMUSCULAR | Status: DC
  Administered 2015-01-28: 3 mL via INTRAVENOUS

## 2015-01-28 MED ORDER — IOHEXOL 350 MG/ML SOLN
100.0000 mL | Freq: Once | INTRAVENOUS | Status: AC | PRN
  Administered 2015-01-28: 100 mL via INTRAVENOUS

## 2015-01-28 MED ORDER — FUROSEMIDE 40 MG PO TABS
40.0000 mg | ORAL_TABLET | Freq: Two times a day (BID) | ORAL | Status: DC
  Administered 2015-01-28 – 2015-01-29 (×3): 40 mg via ORAL
  Filled 2015-01-28 (×4): qty 1

## 2015-01-28 MED ORDER — SODIUM CHLORIDE 0.9 % IV SOLN
Freq: Once | INTRAVENOUS | Status: AC
  Administered 2015-01-28: 13:00:00 via INTRAVENOUS

## 2015-01-28 MED ORDER — HEPARIN (PORCINE) IN NACL 100-0.45 UNIT/ML-% IJ SOLN
1500.0000 [IU]/h | INTRAMUSCULAR | Status: DC
  Administered 2015-01-28: 1200 [IU]/h via INTRAVENOUS
  Filled 2015-01-28 (×4): qty 250

## 2015-01-28 NOTE — ED Notes (Signed)
Called 3E to give report. Receiving RN unable to take report at this time 

## 2015-01-28 NOTE — Progress Notes (Addendum)
ANTICOAGULATION CONSULT NOTE - Initial Consult  Pharmacy Consult for Heparin Indication: chest pain/ACS and atrial fibrillation  Allergies  Allergen Reactions  . Lortab [Hydrocodone-Acetaminophen] Hives  . Morphine And Related Hives    Patient Measurements: Height: 5\' 11"  (180.3 cm) Weight: 189 lb 11.2 oz (86.047 kg) IBW/kg (Calculated) : 75.3 Heparin Dosing Weight: 86 kg  Vital Signs: Temp: 97.7 F (36.5 C) (08/11 1209) Temp Source: Oral (08/11 1209) BP: 163/56 mmHg (08/11 1948) Pulse Rate: 63 (08/11 1948)  Labs:  Recent Labs  01/28/15 1317 01/28/15 1352 01/28/15 1747  HGB 10.5*  --   --   HCT 33.8*  --   --   PLT 203  --   --   CREATININE  --  1.19  --   TROPONINI  --   --  <0.03    Estimated Creatinine Clearance: 57.1 mL/min (by C-G formula based on Cr of 1.19).   Medical History: Past Medical History  Diagnosis Date  . Hyperlipidemia   . DVT (deep venous thrombosis)     post knee surgery  . Tobacco abuse   . HTN (hypertension)   . Descending aortic aneurysm   . Aortic valve insufficiency   . Thoracic aneurysm   . S/P AVR (aortic valve replacement)   . CAD (coronary artery disease)   . Atrial flutter   . CHF (congestive heart failure)    Assessment:   74 yr old male to begin IV heparin for chest pain/ACS.  For lexiscan myocardia perfusion study tomorrow; may need cardiac cath.   On Eliquis 5 mg BID at home for afib.  Last dose ~9-9:30am today.   Will need to use aPTTs to monitor heparin therapy, as heparin levels are likely to be skewed due to recent Eliquis doses.  When aPTT and heparin levels correlate, will stop aPTTs.  Goal of Therapy:  Heparin level 0.3-0.7 units/ml aPTT 66-102 seconds Monitor platelets by anticoagulation protocol: Yes   Plan:   Baseline aPTT and heparin level.  Begin Heparin drip at 1200 units/hr ~12 hrs after last Eliquis dose (~9-9:30pm).  aPTT and heparin level in am, ~ 8 hrs after IV heparin begins.  Daily aPTT and  heparin level, daily CBC while on heparin.  Addenedum:  Amiodarone can inhibit the metabolism of Loratadine, increasing risk for QT prolongation.  Hold Loratadine?   Dennie Fetters, [place] Pager: [phone] 01/28/2015,8:33 PM

## 2015-01-28 NOTE — ED Provider Notes (Signed)
CSN: [id number]     Arrival date & time 01/28/15  1201 History   First MD Initiated Contact with Patient 01/28/15 1207     Chief Complaint  Patient presents with  . Near Syncope     (Consider location/radiation/quality/duration/timing/severity/associated sxs/prior Treatment) HPI Comments: [name] [name] is a 74 y.o. 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 He reports the pain is 10/10 central chest pressure which was nonradiating, constant, improved somewhat with 2 nitroglycerin, with no known aggravating factors. Associated symptoms include diaphoresis, dizziness, pallor, and shortness of breath. His dizziness and SOB worsens when he stands up. He has a extensive history of several aortic aneurysms with gortex repairs, and regularly follows up with his cardiothoracic surgeons. He is compliant with his eliquis. He denies any fevers, chills, cough, hemoptysis, leg swelling, recent travel/surgery/immobilization, history of PE, vaginal pain, nausea vomiting, diarrhea, constipation, melena, hematochezia, dysuria, hematuria, numbness, tingling, weakness, vision changes, headache, syncope, claudication, orthopnea, or recent weight changes. He has not a smoker. He has a positive family history of CABG in his son.  Per chart review from office visit on 10/15/14 with Dr. Tyrone Sage: 02/19/2009 presented with a large ascending aneurysm and acute aortic insufficiency and aortic arch aneurysm and at that time  underwent aortic valve replacement replacement of aortic root and ascending aorta and replacement of aortic arch to the left subclavian artery. Since that time he's had an acute myocardial infarction and had a stent placed. In November 2014 prior to his yearly followup [facility] with an acute type III aortic dissection. He was treated medically, there is hospital stay he did develop persistent atrial fibrillation and ultimately was discharged home on anticoagulation. Prior to his followup scan for the type III aortic dissection he was really admitted with signs and symptoms of congestive heart failure followup scan showed significant enlargement of the type III aortic dissection. August 28, 2013 he underwent left subclavian to carotid bypass ligation of the proximal left subclavian artery an endovascular repair of descending thoracic aorta with coverage of the left subclavian artery.  Patient is a 74 y.o. male presenting with near-syncope. The history is provided by the patient and medical records. No language interpreter was used.  Near Syncope This is a new problem. The current episode started today. The problem occurs rarely. The problem has been gradually improving. Associated symptoms include chest pain and diaphoresis. Pertinent negatives include no abdominal pain, arthralgias, chills, coughing, fever, headaches, myalgias, nausea, numbness, vomiting or weakness. The symptoms are aggravated by standing. He has tried nothing for the symptoms. The treatment provided no relief.    Past Medical History  Diagnosis Date  . Hyperlipidemia   . DVT (deep venous thrombosis)     post knee surgery  . Tobacco abuse   . HTN (hypertension)   . Descending aortic aneurysm   . Aortic valve insufficiency   . Thoracic aneurysm   . S/P AVR (aortic valve replacement)   . CAD (coronary artery disease)   . Atrial flutter   .  CHF (congestive heart failure)    Past Surgical History   Procedure Laterality Date  . Right groin lymphocele  [id number]  . Aortic valve replacement  [id number]  . Replacement total knee      bilateral  . Carotid-subclavian bypass graft Left 08/26/2013    Procedure: BYPASS GRAFT CAROTID-SUBCLAVIAN;  Surgeon: Nada Libman, MD;  Location: [facility] OR;  Service: Vascular;  Laterality: Left;  . Endovascular stent insertion N/A 08/26/2013    Procedure:  THORACIC STENT GRAFT INSERTION;  Surgeon: Nada Libman, MD;  Location[facility] OR;  Service: Vascular;  Laterality: N/A;   Family History  Problem Relation Age of Onset  . Celiac disease Daughter    Social History  Substance Use Topics  . Smoking status: Former Smoker    Types: Cigarettes    Quit date: 04/16/1974  . Smokeless tobacco: Never Used  . Alcohol Use: No    Review of Systems  Constitutional: Positive for diaphoresis. Negative for fever, chills and unexpected weight change.  Eyes: Negative for visual disturbance.  Respiratory: Positive for shortness of breath. Negative for cough and wheezing.   Cardiovascular: Positive for chest pain and near-syncope.  Gastrointestinal: Negative for nausea, vomiting, abdominal pain, diarrhea, constipation and blood in stool.  Genitourinary: Negative for dysuria and hematuria.  Musculoskeletal: Negative for myalgias and arthralgias.  Skin: Positive for pallor.  Allergic/Immunologic: Negative for immunocompromised state.  Neurological: Positive for dizziness and light-headedness. Negative for syncope, weakness, numbness and headaches.  Psychiatric/Behavioral: Negative for confusion.   10 Systems reviewed and are negative for acute change except as noted in the HPI.    Allergies  Lortab and Morphine and related  Home Medications   Prior to Admission medications   Medication Sig Start Date End Date Taking? Authorizing Provider  amiodarone (PACERONE) 200 MG tablet Take 1 tablet (200 mg total) by mouth daily. 06/01/14   [name], MD  apixaban  (ELIQUIS) 5 MG TABS tablet Take 1 tablet (5 mg total) by mouth 2 (two) times daily. 12/14/14   [name], MD  atorvastatin (LIPITOR) 10 MG tablet Take 1 tablet (10 mg total) by mouth daily. 06/01/14   [name], MD  furosemide (LASIX) 40 MG tablet Take 1 tablet (40 mg total) by mouth 2 (two) times daily. 06/01/14   [name], MD  lansoprazole (PREVACID) 30 MG capsule Take 30 mg by mouth daily.     Historical Provider, MD  loratadine (CLARITIN) 10 MG tablet Take 1 tablet (10 mg total) by mouth daily. 06/01/14   [name], MD  metoprolol tartrate (LOPRESSOR) 25 MG tablet Take half a tablet (12.5 mg) twice daily 09/23/14   [name], MD  Multiple Vitamin (MULTIVITAMIN WITH MINERALS) TABS tablet Take 1 tablet by mouth daily.    Historical Provider, MD  nitroGLYCERIN (NITROSTAT) 0.4 MG SL tablet Place 1 tablet (0.4 mg total) under the tongue every 5 (five) minutes as needed. 12/02/13   [name], MD  potassium chloride SA (K-DUR,KLOR-CON) 20 MEQ tablet Take 2 tablets (40 mEq total) by mouth daily. 05/28/14   [name], MD  tamsulosin (FLOMAX) 0.4 MG CAPS capsule Take 0.4 mg by mouth 2 (two) times daily.     Historical Provider, MD   BP 161/44 mmHg  Temp(Src) 97.7 F (36.5 C) (Oral)  Resp 24  Wt 189 lb 11.2 oz (86.047 kg)  SpO2 100%  HR: 63 Physical Exam  Constitutional: He is oriented to person, place, and  time. Vital signs are normal. He appears well-developed and well-nourished.  Non-toxic appearance. No distress.  Afebrile, nontoxic, NAD  HENT:  Head: Normocephalic and atraumatic.  Mouth/Throat: Oropharynx is clear and moist and mucous membranes are normal.  Eyes: Conjunctivae and EOM are normal. Right eye exhibits no discharge. Left eye exhibits no discharge.  Neck: Normal range of motion. Neck supple. No JVD present.  No JVD  Cardiovascular: Normal rate, regular rhythm, normal heart sounds and intact distal pulses.  Exam  reveals no gallop and no friction rub.   No murmur heard. Pulses:      Dorsalis pedis pulses are 1+ on the right side, and 1+ on the left side.  RRR, nl s1/s2, no m/r/g, distal pulses intact although diminished in b/l feet, no pedal edema   Pulmonary/Chest: Effort normal and breath sounds normal. No respiratory distress. He has no decreased breath sounds. He has no wheezes. He has no rhonchi. He has no rales. He exhibits no tenderness, no crepitus, no deformity and no retraction.  CTAB in all lung fields, no w/r/r, no hypoxia or increased WOB, speaking in full sentences, SpO2 100% on RA Chest wall nonTTP without crepitus or deformity, no retractions  Abdominal: Soft. Normal appearance and bowel sounds are normal. He exhibits no distension. There is no tenderness. There is no rigidity, no rebound, no guarding, no tenderness at McBurney's point and negative Murphy's sign.  Musculoskeletal: Normal range of motion.  Neurological: He is alert and oriented to person, place, and time. He has normal strength. No sensory deficit.  Skin: Skin is warm, dry and intact. No rash noted.  Psychiatric: He has a normal mood and affect.  Nursing note and vitals reviewed.   ED Course  Procedures (including critical care time)  12:11:29 Orthostatic Vital Signs HD  Orthostatic Lying  - BP- Lying: 161/44 mmHg ; Pulse- Lying: 54  Orthostatic Sitting - BP- Sitting: 175/55 mmHg ; Pulse- Sitting: 54  Orthostatic Standing at 0 minutes - BP- Standing at 0 minutes: 166/57 mmHg ; Pulse- Standing at 0 minutes: 63      Labs Review Labs Reviewed  CBC WITH DIFFERENTIAL/PLATELET - Abnormal; Notable for the following:    RBC 3.80 (*)    Hemoglobin 10.5 (*)    HCT 33.8 (*)    RDW 16.0 (*)    Neutrophils Relative % 86 (*)    Lymphocytes Relative 8 (*)    Lymphs Abs 0.5 (*)    All other components within normal limits  BRAIN NATRIURETIC PEPTIDE - Abnormal; Notable for the following:    B Natriuretic Peptide 278.9 (*)     All other components within normal limits  COMPREHENSIVE METABOLIC PANEL - Abnormal; Notable for the following:    Glucose, Bld 146 (*)    Calcium 8.8 (*)    GFR calc non Af Amer 58 (*)    All other components within normal limits  TROPONIN I  I-STAT TROPOININ, ED    Imaging Review Dg Chest 2 View  01/28/2015   CLINICAL DATA:  Chest pain started this morning. History of hypertension and thoracic aortic stent graft.  EXAM: CHEST  2 VIEW  COMPARISON:  10/21/2013  FINDINGS: Stable positioning of the thoracic stent graft extending from the aortic arch to the distal descending thoracic aorta. Again noted is a prosthetic aortic valve. Heart size is stable and within normal limits. Both lungs are clear without airspace disease or pulmonary edema. Patient has median sternotomy wires. No acute bone abnormality.  IMPRESSION:  No acute chest abnormality.  Stable postoperative changes.   Electronically Signed   By: Richarda Overlie M.D.   On: 01/28/2015 14:19   Ct Angio Chest Aorta W/cm &/or Wo/cm  01/28/2015   CLINICAL DATA:  Chest pain  EXAM: CT ANGIOGRAPHY CHEST, ABDOMEN AND PELVIS WITH CONTRAST  TECHNIQUE: Multidetector CT imaging of the chest, abdomen and pelvis was performed using the standard protocol during bolus administration of intravenous contrast. Multiplanar CT image reconstructions and MIPs were obtained to evaluate the vascular anatomy.  CONTRAST:  OMNIPAQUE IOHEXOL 350 MG/ML SOLN  COMPARISON:  02/12/2014  FINDINGS: Thorax:  The thoracic aortic stent graft remains stable in position. Maximal aneurysm sac diameter has decreased from 5.8 cm on the prior study, based on my direct measurements, to 5.0 cm today. There is no evidence of endoleak.  The left subclavian artery remains excluded with occlusion of the proximal segment. It reconstitutes just proximal to the takeoff of the left vertebral artery. Bypass graft from the left common carotid artery to the left carotid artery is patent.  Innominate  artery, right subclavian artery, right common carotid artery are patent with atherosclerotic changes. The right vertebral artery is extremely diminutive and poorly opacifies. Left vertebral artery is dominant and is widely patent within the confines of the study. The distal left subclavian artery is patent.  Artificial aortic valve is in place. Scattered coronary artery calcifications are noted.  There is no obvious evidence of acute pulmonary thromboembolism.  There is no evidence of abnormal mediastinal or hilar adenopathy.  Lungs are clear.  No pneumothorax or pleural effusion.  No vertebral compression deformity.  Abdomen and pelvis:  At the diaphragmatic hiatus, maximal aortic diameter is 4.0 cm. In the infrarenal aorta, maximal AP and transverse diameters are 3.1 and 2.6 cm. Previously, maximal aortic diameter below the renal arteries was 3.0 cm.  Celiac is patent. Branch vessels are patent. Accessory left hepatic artery anatomy. No splenic artery aneurysm.  SMA is patent with mild atherosclerotic changes at its origin. Branch vessels are grossly patent  IMA is patent.  Branch vessels are patent  Single renal arteries are patent. Atherosclerotic calcifications at their origins are noted.  Right common, internal, and external iliac arteries are patent with mild atherosclerotic changes. Left common, external, and internal iliac arteries are patent with mild diffuse atherosclerotic calcifications.  Gallstones  Liver, spleen, pancreas, adrenal glands are within normal limits.  Hyperdense cysts in the upper pole of the right kidney demonstrates no significant enhancement comparing pre and postcontrast images. Tiny hypodensity in the left kidney anteriorly on image 51 of series 601 is nonspecific. No hydronephrosis. No definite renal mass. Tiny cyst in the lower pole of the left kidney, anteriorly is noted on image 56.  Prostate is mildly enlarged extending through the base of the bladder. Bladder is otherwise  within normal limits  Sigmoid diverticulosis without evidence of acute diverticulitis.  No free-fluid.  No abnormal adenopathy.  No vertebral compression deformity. 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  Review of the MIP images confirms the above findings.  IMPRESSION: Thoracic aortic stent graft is in place without complication. There is no endoleak. Aneurysm sac diameter in the thorax has diminished from 5.8 cm to 5.0 cm.  Stable appearance of the thoracic vasculature. The left subclavian artery origin has been excluded and a left carotid to subclavian artery bypass graft is patent. The left vertebral artery is dominant.  Ectasia of the infrarenal abdominal aorta has increased from  3.0 cm to 3.1 cm.  Cholelithiasis.   Electronically Signed   By: Arthur  Hoss M.D.   On: 01/28/2015 16:06   Ct Angio Abd/pel W/ And/or W/o  01/28/2015   CLINICAL DATA:  Chest pain  EXAM: CT ANGIOGRAPHY CHEST, ABDOMEN AND PELVIS WITH CONTRAST  TECHNIQUE: Multidetector CT imaging of the chest, abdomen and pelvis was performed using the standard protocol during bolus administration of intravenous contrast. Multiplanar CT image reconstructions and MIPs were obtained to evaluate the vascular anatomy.  CONTRAST:  <MEASUREMEN16109Cataract Specialty Surgical CenSagecrest H72536ThermPurvis [phone]-Bluegrass Surgery And Laser CentWakemedDoll205-7Vantage Surgical Associates LLC Dba Vantage Surgery Cent28erMarland Kitch11 16109Gi Asc Apollo HospitalLCTeache72536ThermPurvis [phone]-Clifton T Perkins Hospital CentThe Greenwood Endoscopy Center IncDoll408-4Aspen Hills Healthcare Cent33erMarland Kitch11 16109Hardin Memorial HospiMatagor72536ThermPurvis [phone]-Shore Medical CentWashington County Memorial [phone] At Parks[facility],T42heMarland Kitch11 16109Dublin Methodist HospiBristol Hosp72536ThermPurvis [phone]-Barnes-Jewish HospitLafayette Physical Rehabilitation [phone] Medical Center - Baton Rou59geMarland Kitch11 16109Four Seasons Endoscopy Center Summit Ambulato72536ThermPurvis [phone]-Plano Surgical HospitSparrow Specialty HospitalDoll671-6Ascension Macomb Oakland Hosp-Warren Camp33usMarland Kitch11 16109Bone And Joint Surgery Center Of NGreater S72536ThermPurvis [phone]-Phycare Surgery Center LLC Dba Physicians Care Surgery CentCandescent Eye Health Surgicenter [phone] Health Providers Ltd Partnership - Dba Rml Hinsda59leMarland Kitch11 16109Regional Behavioral Health CenEndoscopy Center Of El PasoerTeacher, early yearsMarl72536ThermPurvis [phone]-Baylor Scott & White Medical Center - PlaSpaulding Hospital For Continuing Med Care CambridgeDoll262-2Memorial Hospit28alMarland Kitch11 16109Easton HospiVibr72536ThermPurvis [phone] Willow Treatment CentSt. Bernardine Medical CenterDoll415-2Southern Nevada Adult Mental Health Servic8esMarland Kitch11 16109Malcom Randall Va Medical CenDayton Va Medical Centerer72536ThermPurvis [phone]-Rose Medical CentJackson Parish HospitalDoll334-8Riverwood Healthcare Cent78erMarland Kitch11 16109Plantation General HospiCarl R. Darnall Army Medical CenteralTeacher, early ye72536ThermPurvis [phone]-Baptist Eastpoint Surgery Center LBristow Medical CenterDoll929College Hospital Costa Me56saMarland Kitch11 16109Prince William Ambulatory Surgery CenTewksbury Hospital72536ThermPurvis [phone]-Sloan Eye ClinWills Surgery Center In Northeast PhiladeLPhiaDoll681-6New Albany Surgery Center L61LCMarland Kitch11 16109Metroeast Endoscopic Surgery CenEndoscop72536ThermPurvis [phone] NorConejo Valley Surgery Center [phone] Rehabilitati7onMarland Kitch11 16109Mid Dakota ClinicSheriff Al Cannon Detenti72536ThermPurvis [phone]-Indiana University Health White Memorial HospitEncompass Health Rehabilitation Hospital Of [phone] Eye Surgery Center 71LPMarland Kitch11 16109Augusta Eye Surgery Sage Specialty HospitalLCTeacher72536ThermPurvis [phone]-North Shore Medical Center - Union CampGeisinger Endoscopy MontoursvilleDoll203-0Ucsd Ambulatory Surgery Center L31LCMarland Kitch119en1PLevindale Hebrew Geriatric Center & HospitalGenesis Behavioral HospitalMarland Kitc 16109Harrison Medical CenWilliamson M72536ThermPurvis [phone]-Va Central California Health Care SystLebauer Endoscopy CenterDoll312-4Samaritan Endoscopy L74LCMarland Kitch11 16109Bronson Methodist HospiValley Hospital Medical CenteralT72536ThermPurvis [phone]-Fisher-Titus HospitMemorial HospitalDoll864-1Lifecare Hospitals Of Wiscons81inMarland Kitch11 16109Grossnickle Eye Center Hima San72536ThermPurvis [phone]-South Texas Spine And Surgical HospitNorman Regional HealthplexDoll431-8Rutherford Hospital, In57cMarland Kitch11 16109Apex Surgery CenWest Hills Hospital And Medica72536ThermPurvis [phone]-Spokane Digestive Disease Center Total Eye Care Surgery Center [phone] Hospit40alMarland Kitch11 16109Syracuse Endoscopy AssociaLittle River Healthcare - Cameron Hospit72536ThermPurvis [phone]-Navarro Regional HospitCalhoun Memorial HospitalDoll541Conway Endoscopy Center I88ncMarland Kitch11 16109Women'S Center Of Carolinas Hospital SysEndocentre Of Baltim72536ThermPurvis [phone]-Mayers Memorial HospitWestern Avenue Day Surgery Center Dba Division Of Plastic And Hand Surgical [phone] Lukes Endoscopy Center Buxmo76ntMarland Kitch11 16109Greenbrier Valley Medical CenHorsham Cli72536ThermPurvis [phone]-Clovis Community Medical CentTrident Ambulatory Surgery Center LPDoll807-4Shepherd Cent57erMarland Kitch11 16109John T Mather Memorial Hospital Of Port [place] Sanford Jackson Me72536ThermPurvis S531764Regency Hospital Of MeridiOutpatient Surgical Care LtdDoll443-4Uf Health Jacksonvil14leMarland Kitch11 16109Ray County Memorial HospiSt Mary'S Medical Ce72536ThermPurvis [phone]-Encompass Health Rehabilitation Hospital Of AltooProvidence Valdez Medical CenterDoll276-6Adventhealth Hendersonvil60leMarland Kitch11 16109Ohio Valley Medical CenCalvert Health Medical CentererTeacher72536ThermPurvis S325 [phone] Christus Spohn Hospital KlebeTrace Regional HospitalDoll623-4Waterbury Hospit35alMarland Kitch11 16109Hosp Metropolitano De San JAcuity Specialty Hospital Ohio Valley WheelinganTeacher, ear72536ThermPurvis [phone]-John T Mather Memorial Hospital Of [place] IMadison Surgery Center LLCDoll272-2Jones Eye Clin63icMarland Kitch11 16109Scripps Memorial Hospital - EnciniFairview No72536ThermPurvis [phone]-Joint Township District Memorial HospitNorth Star Hospital - Bragaw CampusDoll249 3Westside Outpatient Center L57LCMarland Kitch11 16109Nye Regional Medical CenSagecrest Hospital G72536ThermPurvis [phone]-Saint Josephs Wayne HospitMclaren MacombDoll617-3Blue Ridge Surgery Cent27erMarland Kitch11<MEASUREMENTFranklin County Medical Cen38tKat63h130([phone] [name] Ob[place] Co Psychiatric Health FacilityDoll76911 16109Specialty Surgery Laser CenBaptist Medical Center - Princetoner72536ThermPurvis S725-525-Prairie Lakes HospitBhc Fairfax HospitalDoll514-6Beverly Hills Endoscopy L27LCMarland Kitch11 16109Trinitas Hospital - New Point CamTift Regional Medical CenterusTeac72536ThermPurvis S548857Northeast Rehabilitation HospitSelect Spec Hospital Lukes CampusDoll763-8Physicians Surgery Center Of Neva73daMarland Kitch11 16109Aims Outpatient SurgSouthcoast Hospitals Group - Tobey Hospital Camp72536ThermPurvis [phone]-Vibra Hospital Of Northern CalifornKindred Hospital AuroraDoll705-4Riverside Behavioral Health Cent59erMarland Kitch11 16109Surgery Center At Health Park Golden Triangle Surgicenter L72536ThermPurvis [phone]-Naval Health Clinic Cherry PoiUniversity Surgery Center LtdDoll267-3Sharp Chula Vista Medical Cent23erMarland Kitch11 16109Alliance Community HospiThe Hospitals Of Providence Northeast CampusalTeacher, 72536ThermPurvis S386-594-Ark[place] Specialty Surgery CentMethodist Rehabilitation HospitalDoll435-3Aiken Regional Medical Cent24erMarland Kitch11 16109Santa Rosa Medical CenTewksbury 72536ThermPurvis S442-200-Endeavor Surgical CentLiberty Endoscopy CenterDoll339-7Sumner County Hospit53alMarland Kitch11 16109Dayton Children'S HospiHudson Surgic72536ThermPurvis [phone]-Surgicare Of Jackson LMagnolia Behavioral Hospital Of East [phone] Hospit30alMarland Kitch11 16109Bahamas Surgery CenAnn & Azim H Lurie Children'S Hospital Of Chicagoer72536ThermPurvis [phone]-Fresno Surgical HospitPrisma Health Surgery Center [phone] Methodist Hospit28alMarland Kitch11 16109Palisades Medical CenBaptist Health Medical Center - Little RockerTea72536ThermPurvis [phone]-Sierra Nevada Memorial HospitAscension St Marys [phone] General Hospit42alMarland Kitch11 16109Abilene Cataract And Refractive Surgery CenWalla Walla Clinic IncerTea72536ThermPurvis [phone]-Surgery Center Of [place]ake LOverland Park Reg Med CtrDoll585Davita Medical Gro77upMarland Kitch11 16109Mclaren Central MichiKansas Medica72536ThermPurvis [phone]-Preston Surgery Center LCarlinville Area [phone] Outpatient Surgery Center L57LCMarland Kitch11 16109Overton Brooks Va Medical Center (ShrevepoAnmed Health Rehabilitation Ho72536ThermPurvis S808-840-Poudre Valley HospitUnion General [phone] Community Hospit87alMarland Kitch11 16109Ahmc Anaheim Regional Medical CenOchsner Baptist Medical CentererT72536ThermPurvis [phone]-Wal[facility] Endoscopy Center LMt Ogden Utah Surgical Center LLCDoll862-0Berger Hospit65alMarland Kitch119en83-1583MeresgyearsMarland Kit60cheKat55hlen130C406 [facility]vue Ambulatory [phone] stent graft remains stable in position. Maximal aneurysm sac diameter has decreased from 5.8 cm on the prior study, based on my direct measurements, to 5.0 cm today. There is no evidence of endoleak.  The left subclavian artery remains excluded with occlusion of the proximal segment. It reconstitutes just proximal to the takeoff of the left vertebral artery. Bypass graft from the left common carotid artery to the left carotid artery is patent.  Innominate artery, right subclavian artery, right common carotid artery are patent with atherosclerotic changes. The right vertebral artery is extremely diminutive  and poorly opacifies. Left vertebral artery is dominant and is widely patent within the confines of the study. The distal left subclavian artery is patent.  Artificial aortic valve is in place. Scattered coronary artery calcifications are noted.  There is no obvious evidence of acute pulmonary thromboembolism.  There is no evidence of abnormal mediastinal or hilar adenopathy.  Lungs are clear.  No pneumothorax or pleural effusion.  No vertebral compression deformity.  Abdomen and pelvis:  At the diaphragmatic hiatus, maximal aortic diameter is 4.0 cm. In the infrarenal aorta, maximal AP and transverse diameters are 3.1 and 2.6 cm. Previously, maximal aortic diameter below the renal arteries was 3.0 cm.  Celiac is patent. Branch vessels are patent. Accessory left hepatic artery anatomy. No splenic artery aneurysm.  SMA is patent with mild atherosclerotic changes at its origin. Branch vessels are grossly patent  IMA is patent.  Branch vessels are patent  Single renal arteries are patent. Atherosclerotic calcifications at their origins are noted.  Right common, internal, and external iliac arteries are patent with mild atherosclerotic changes. Left common, external, and internal iliac arteries are patent with mild diffuse atherosclerotic calcifications.  Gallstones  Liver, spleen, pancreas, adrenal glands are within normal limits.  Hyperdense cysts in the upper pole of the right kidney demonstrates no significant enhancement comparing pre and postcontrast images. Tiny hypodensity in the left kidney anteriorly on image 51 of series 601 is nonspecific. No hydronephrosis. No definite renal mass. Tiny cyst in the lower pole of the left kidney, anteriorly is noted on image 56.  Prostate is mildly enlarged extending through the base of the bladder. Bladder is otherwise within normal limits  Sigmoid diverticulosis without evidence of acute diverticulitis.  No free-fluid.  No abnormal adenopathy.  No vertebral compression  deformity. 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  Review of the MIP images confirms the above findings.  IMPRESSION: Thoracic aortic stent graft is in place without complication. There is no endoleak. Aneurysm sac diameter in the thorax has diminished from 5.8 cm to 5.0 cm.  Stable appearance of the thoracic vasculature. The left subclavian artery origin has been excluded and a left carotid to subclavian artery bypass graft is patent. The left vertebral artery is dominant.  Ectasia of the infrarenal abdominal aorta has increased from 3.0 cm  to 3.1 cm.  Cholelithiasis.   Electronically Signed   By: Jolaine Click M.D.   On: 01/28/2015 16:06     EKG Interpretation None      MDM   Final diagnoses:  Chest pain at rest  Diaphoresis  Dizziness  SOB (shortness of breath)    74 y.o. male here with sudden onset CP, diaphoresis, lightheadedness while at rest. Took two NTG with some relief. Lightheadedness worsens with standing. On Eliquis. No LE swelling. Doubt PE, but ACS or dissection on differential. Will obtain labs, CXR, EKG, and CT chest. Likely admit pt. Will hold on meds for now since pt took NTG and felt better. Will give gentle hydration since orthostatic VS caused worsening lightheadedness, although orthostatics negative. Will reassess shortly.   4:34 PM CMP WNL aside from mildly elevated glucose. Trop neg. Will get repeat trop now since it's been 3hrs. CBC w/diff showing stable improved anemia. BNP 278.9. CTA chest/abd/pelvis without evidence of complications to gortex grafts and no endoleaks. CXR without acute findings. EKG unchanged. Given risk factors, will admit for full ACS r/o. Pt currently CP free.  5:19 PM IM residents returning page, will admit pt. Please see there notes for further documentation of care.  BP 149/52 mmHg  Pulse 59  Temp(Src) 97.7 F (36.5 C) (Oral)  Resp 18  Wt 189 lb 11.2 oz (86.047 kg)  SpO2 100%  Meds ordered this encounter   Medications  . 0.9 %  sodium chloride infusion    Sig:   . iohexol (OMNIPAQUE) 350 MG/ML injection 100 mL    Sig:      Tallin Hart Camprubi-Soms, PA-C 01/28/15 1720  Benjiman Core, MD 02/01/15 1450

## 2015-01-28 NOTE — Consult Note (Addendum)
Name: [name] [name] is a 74 y.o. male Admit date: 01/28/2015 Referring Physician:  Internal Medicine Teaching Primary Physician:  Same and EDP Primary Cardiologist:  Everette Rank, MD  Reason for Consultation:  Near syncope and Chest pain  ASSESSMENT: 1. Chest pain r/o ischemia related. Patient has history of bare metal stent in the circumflex 2012 for ACS and chronic total occlusion of RCA.. 2. Syncope after sublingual nitroglycerin 3. 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 4. 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 5. History of paroxysmal atrial fibrillation, 2015. Rhythm control on amiodarone 6. Diastolic heart failure, with most recent EF 50% March 2015 7. Chronic anticoagulation therapy with Eliquis 8. Long QT on ECG   PLAN:  1. Assuming negative cardiac markers, the patient should undergo a Lexiscan myocardial perfusion study to rule out high-risk ischemic substrate. Should the patient rule in for myocardial injury, he will need to have coronary angiography (had large contrast exposure today related to CT studies). Should angiography become necessary, a femoral or right radial approach could be used. The left radial is not feasible due to prior subclavian to carotid bypass followed by endovascular repair of type B dissection.. The nuclear study will be abnormal since the patient has a known RCA total occlusion. If there is lateral wall ischemia or anterior ischemia, angiography would need to be done. Inferior wall ischemia alone should not provoke invasive evaluation in absence of other strong indications. 2. Hold Apixiban and and use IV heparin 3. Continue other medications as per usual home therapy 4. Check magnesium and correct hypokalemia if  present 5. Trend cardiac markers and repeat EKG in a.m.  HPI: 74 year old gentleman with a history of aortic disease who is undergone a Bentall procedure for ascending and arch aortic aneurysm and severe aortic regurgitation. He also has a history of type B aortic dissection and is undergone Endo-therapy of the dissection and left subclavian to left carotid bypass. He has a history of paroxysmal atrial fibrillation now with rhythm control, essential hypertension, and diastolic heart failure. There is an antecedent history of coronary artery disease and bare-metal stent implantation in the mid circumflex in 2012 during and ACS. He is known to have a chronically occluded RCA.  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 He received 2 sublingual nitroglycerin tablets and had syncope thereafter. Following syncope the chest discomfort had resolved. He has been increasingly active over the past year following his most recent aortic procedures. He does complain of some moderate exertional dyspnea. This is been chronic and stable. There is no exertional chest discomfort. He does take sublingual nitroglycerin from time to time.  PMH:   Past Medical History  Diagnosis Date  . Hyperlipidemia   . DVT (deep venous thrombosis)     post knee surgery  . Tobacco abuse   . HTN (hypertension)   . Descending aortic aneurysm   . Aortic valve insufficiency   . Thoracic aneurysm   . S/P AVR (aortic valve replacement)   . CAD (coronary artery disease)   . Atrial flutter   . CHF (congestive heart failure)     PSH:   Past Surgical History  Procedure Laterality Date  . Right groin lymphocele  [id number]  . Aortic valve replacement  [id number]  . Replacement total  knee      bilateral  . Carotid-subclavian bypass graft Left 08/26/2013    Procedure: BYPASS GRAFT CAROTID-SUBCLAVIAN;  Surgeon: Nada Libman, MD;  Location: [facility] OR;  Service: Vascular;   Laterality: Left;  . Endovascular stent insertion N/A 08/26/2013    Procedure:  THORACIC STENT GRAFT INSERTION;  Surgeon: Nada Libman, MD;  Location: [facility] OR;  Service: Vascular;  Laterality: N/A;   Allergies:  Lortab and Morphine and related Prior to Admit Meds:   (Not in a hospital admission) Fam HX:    Family History  Problem Relation Age of Onset  . Celiac disease Daughter    Social HX:    Social History   Social History  . Marital Status: Married    Spouse Name: N/A  . Number of Children: N/A  . Years of Education: N/A   Occupational History  . Not on file.   Social History Main Topics  . Smoking status: Former Smoker    Types: Cigarettes    Quit date: 04/16/1974  . Smokeless tobacco: Never Used  . Alcohol Use: No  . Drug Use: No  . Sexual Activity: Not on file   Other Topics Concern  . Not on file   Social History Narrative     Review of Systems: He has not noticed blood in his urine as stool. Denies transient neurological symptoms. 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 He denies dyspnea. He does have a history of chronic diastolic heart failure.  Physical Exam: Blood pressure 166/50, pulse 58, temperature 97.7 F (36.5 C), temperature source Oral, resp. rate 16, weight 86.047 kg (189 lb 11.2 oz), SpO2 100 %. Weight change:    Patient is lying comfortably on the hospital gurney in the emergency room. No distress. Skin color and touch is normal. HEENT exam reveals no jaundice or pallor. Neck reveals no JVD. Bilateral left greater than right carotid bruits are heard. Chest is clear to auscultation and percussion Cardiac exam reveals a soft systolic murmur right upper sternal border. No diastolic murmurs heard. An S4 gallop is audible. Abdomen is soft. Bowel sounds are normal. No tenderness. Extremities reveal no edema. Radial pulses are intact. Neuro exam reveals some difficulty with speech but otherwise unremarkable   Labs: Lab  Results  Component Value Date   WBC 6.3 01/28/2015   HGB 10.5* 01/28/2015   HCT 33.8* 01/28/2015   MCV 88.9 01/28/2015   PLT 203 01/28/2015    Recent Labs Lab 01/28/15 1352  NA 143  K 4.4  CL 106  CO2 29  BUN 20  CREATININE 1.19  CALCIUM 8.8*  PROT 6.6  BILITOT 0.8  ALKPHOS 58  ALT 18  AST 27  GLUCOSE 146*   No results found for: PTT Lab Results  Component Value Date   INR 1.74* 10/06/2013   INR 1.22 08/27/2013   INR 1.59* 08/22/2013   Lab Results  Component Value Date   CKTOTAL 102 07/29/2010   CKMB 3.5 07/29/2010   TROPONINI <0.30 10/07/2013     Lab Results  Component Value Date   CHOL 152 10/10/2013   CHOL  07/29/2010    136        ATP III CLASSIFICATION:  <200     mg/dL   Desirable  161-096  mg/dL   Borderline High  >=045    mg/dL   High          Lab Results  Component Value Date   HDL 38* 07/29/2010  Lab Results  Component Value Date   [facility]  07/29/2010    80        Total Cholesterol/HDL:CHD Risk Coronary Heart Disease Risk Table                     Men   Women  1/2 Average Risk   3.4   3.3  Average Risk       5.0   4.4  2 X Average Risk   9.6   7.1  3 X Average Risk  23.4   11.0        Use the calculated Patient Ratio above and the CHD Risk Table to determine the patient's CHD Risk.        ATP III CLASSIFICATION (LDL):  <100     mg/dL   Optimal  282-060  mg/dL   Near or Above                    Optimal  130-159  mg/dL   Borderline  156-153  mg/dL   High  >794     mg/dL   Very High   Lab Results  Component Value Date   TRIG 116 10/07/2013   TRIG 89 07/29/2010   Lab Results  Component Value Date   CHOLHDL 3.6 07/29/2010   No results found for: LDLDIRECT    Radiology:  Dg Chest 2 View  01/28/2015   CLINICAL DATA:  Chest pain started this morning. History of hypertension and thoracic aortic stent graft.  EXAM: CHEST  2 VIEW  COMPARISON:  10/21/2013  FINDINGS: Stable positioning of the thoracic stent graft extending from  the aortic arch to the distal descending thoracic aorta. Again noted is a prosthetic aortic valve. Heart size is stable and within normal limits. Both lungs are clear without airspace disease or pulmonary edema. Patient has median sternotomy wires. No acute bone abnormality.  IMPRESSION: No acute chest abnormality.  Stable postoperative changes.   Electronically Signed   By: Richarda Overlie M.D.   On: 01/28/2015 14:19   Ct Angio Chest Aorta W/cm &/or Wo/cm  01/28/2015   CLINICAL DATA:  Chest pain  EXAM: CT ANGIOGRAPHY CHEST, ABDOMEN AND PELVIS WITH CONTRAST  TECHNIQUE: Multidetector CT imaging of the chest, abdomen and pelvis was performed using the standard protocol during bolus administration of intravenous contrast. Multiplanar CT image reconstructions and MIPs were obtained to evaluate the vascular anatomy.  CONTRAST:  OMNIPAQUE IOHEXOL 350 MG/ML SOLN  COMPARISON:  02/12/2014  FINDINGS: Thorax:  The thoracic aortic stent graft remains stable in position. Maximal aneurysm sac diameter has decreased from 5.8 cm on the prior study, based on my direct measurements, to 5.0 cm today. There is no evidence of endoleak.  The left subclavian artery remains excluded with occlusion of the proximal segment. It reconstitutes just proximal to the takeoff of the left vertebral artery. Bypass graft from the left common carotid artery to the left carotid artery is patent.  Innominate artery, right subclavian artery, right common carotid artery are patent with atherosclerotic changes. The right vertebral artery is extremely diminutive and poorly opacifies. Left vertebral artery is dominant and is widely patent within the confines of the study. The distal left subclavian artery is patent.  Artificial aortic valve is in place. Scattered coronary artery calcifications are noted.  There is no obvious evidence of acute pulmonary thromboembolism.  There is no evidence of abnormal mediastinal or hilar adenopathy.  Lungs are clear.  No pneumothorax or pleural effusion.  No vertebral compression deformity.  Abdomen and pelvis:  At the diaphragmatic hiatus, maximal aortic diameter is 4.0 cm. In the infrarenal aorta, maximal AP and transverse diameters are 3.1 and 2.6 cm. Previously, maximal aortic diameter below the renal arteries was 3.0 cm.  Celiac is patent. Branch vessels are patent. Accessory left hepatic artery anatomy. No splenic artery aneurysm.  SMA is patent with mild atherosclerotic changes at its origin. Branch vessels are grossly patent  IMA is patent.  Branch vessels are patent  Single renal arteries are patent. Atherosclerotic calcifications at their origins are noted.  Right common, internal, and external iliac arteries are patent with mild atherosclerotic changes. Left common, external, and internal iliac arteries are patent with mild diffuse atherosclerotic calcifications.  Gallstones  Liver, spleen, pancreas, adrenal glands are within normal limits.  Hyperdense cysts in the upper pole of the right kidney demonstrates no significant enhancement comparing pre and postcontrast images. Tiny hypodensity in the left kidney anteriorly on image 51 of series 601 is nonspecific. No hydronephrosis. No definite renal mass. Tiny cyst in the lower pole of the left kidney, anteriorly is noted on image 56.  Prostate is mildly enlarged extending through the base of the bladder. Bladder is otherwise within normal limits  Sigmoid diverticulosis without evidence of acute diverticulitis.  No free-fluid.  No abnormal adenopathy.  No vertebral compression deformity. 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  Review of the MIP images confirms the above findings.  IMPRESSION: Thoracic aortic stent graft is in place without complication. There is no endoleak. Aneurysm sac diameter in the thorax has diminished from 5.8 cm to 5.0 cm.  Stable appearance of the thoracic vasculature. The left subclavian artery origin has been excluded and  a left carotid to subclavian artery bypass graft is patent. The left vertebral artery is dominant.  Ectasia of the infrarenal abdominal aorta has increased from 3.0 cm to 3.1 cm.  Cholelithiasis.   Electronically Signed   By: Jolaine Click M.D.   On: 01/28/2015 16:06   Ct Angio Abd/pel W/ And/or W/o  01/28/2015   CLINICAL DATA:  Chest pain  EXAM: CT ANGIOGRAPHY CHEST, ABDOMEN AND PELVIS WITH CONTRAST  TECHNIQUE: Multidetector CT imaging of the chest, abdomen and pelvis was performed using the standard protocol during bolus administration of intravenous contrast. Multiplanar CT image reconstructions and MIPs were obtained to evaluate the vascular anatomy.  CONTRAST:  OMNIPAQUE IOHEXOL 350 MG/ML SOLN  COMPARISON:  02/12/2014  FINDINGS: Thorax:  The thoracic aortic stent graft remains stable in position. Maximal aneurysm sac diameter has decreased from 5.8 cm on the prior study, based on my direct measurements, to 5.0 cm today. There is no evidence of endoleak.  The left subclavian artery remains excluded with occlusion of the proximal segment. It reconstitutes just proximal to the takeoff of the left vertebral artery. Bypass graft from the left common carotid artery to the left carotid artery is patent.  Innominate artery, right subclavian artery, right common carotid artery are patent with atherosclerotic changes. The right vertebral artery is extremely diminutive and poorly opacifies. Left vertebral artery is dominant and is widely patent within the confines of the study. The distal left subclavian artery is patent.  Artificial aortic valve is in place. Scattered coronary artery calcifications are noted.  There is no obvious evidence of acute pulmonary thromboembolism.  There is no evidence of abnormal mediastinal or hilar adenopathy.  Lungs are clear.  No pneumothorax or  pleural effusion.  No vertebral compression deformity.  Abdomen and pelvis:  At the diaphragmatic hiatus, maximal aortic diameter is 4.0  cm. In the infrarenal aorta, maximal AP and transverse diameters are 3.1 and 2.6 cm. Previously, maximal aortic diameter below the renal arteries was 3.0 cm.  Celiac is patent. Branch vessels are patent. Accessory left hepatic artery anatomy. No splenic artery aneurysm.  SMA is patent with mild atherosclerotic changes at its origin. Branch vessels are grossly patent  IMA is patent.  Branch vessels are patent  Single renal arteries are patent. Atherosclerotic calcifications at their origins are noted.  Right common, internal, and external iliac arteries are patent with mild atherosclerotic changes. Left common, external, and internal iliac arteries are patent with mild diffuse atherosclerotic calcifications.  Gallstones  Liver, spleen, pancreas, adrenal glands are within normal limits.  Hyperdense cysts in the upper pole of the right kidney demonstrates no significant enhancement comparing pre and postcontrast images. Tiny hypodensity in the left kidney anteriorly on image 51 of series 601 is nonspecific. No hydronephrosis. No definite renal mass. Tiny cyst in the lower pole of the left kidney, anteriorly is noted on image 56.  Prostate is mildly enlarged extending through the base of the bladder. Bladder is otherwise within normal limits  Sigmoid diverticulosis without evidence of acute diverticulitis.  No free-fluid.  No abnormal adenopathy.  No vertebral compression deformity. 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  Review of the MIP images confirms the above findings.  IMPRESSION: Thoracic aortic stent graft is in place without complication. There is no endoleak. Aneurysm sac diameter in the thorax has diminished from 5.8 cm to 5.0 cm.  Stable appearance of the thoracic vasculature. The left subclavian artery origin has been excluded and a left carotid to subclavian artery bypass graft is patent. The left vertebral artery is dominant.  Ectasia of the infrarenal abdominal aorta has increased  from 3.0 cm to 3.1 cm.  Cholelithiasis.   Electronically Signed   By: Jolaine Click M.D.   On: 01/28/2015 16:06    EKG:  Sinus bradycardia, prominent voltage, prolonged QT. Nonspecific ST depression    Lesleigh Noe 01/28/2015 6:35 PM

## 2015-01-28 NOTE — ED Notes (Signed)
Pt has hx of aortic aneurysm. Pt was in chair getting hair cut. Pt started having CP. Took 1 nitro. [name] wife gave second nitro. Pt started feeling dizzy/light headed. EMS arrived and pt pale, cool diaphoretic. BP from EMS was 107/48, HR 50's, denies SOB and CP at this time. Pt now complaining of abd pain.

## 2015-01-28 NOTE — H&P (Signed)
Date: 01/28/2015               Patient Name:  [name] [name] MRN: [id number]  DOB: 1940/11/04 Age / Sex: 74 y.o., male   PCP: [name], MD         Medical Service: Internal Medicine Teaching Service         Attending Physician: Dr. Doneen Poisson, MD    First Contact: Dr. Earnest Conroy Pager: [phone]  Second Contact: Dr. 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 Earlier today while getting his haircut, he began feeling left-sided chest pain and became very sweaty. His wife gave him one sublingual nitroglycerin but his chest pain persisted. Ten minutes later, she gave him another one, at which point he began feeling lightheaded and subsequently passed out for two to three minutes. His wife lowered him from the chair down to the ground. Recently, he denies orthopnea, paroxysmal nocturnal dyspnea, leg swelling, and he is very diligent about taking his medications.  In the emergency department, his vital signs were stable, orthostatics were negative, an EKG showed normal sinus rhythm without ST changes, unchanged from prior EKG, chest and abdominal CT did not show any acute leaks of prior aortic surgeries, and troponins were negative times two.  Meds: No current facility-administered medications for this encounter.   Current Outpatient  Prescriptions  Medication Sig Dispense Refill  . amiodarone (PACERONE) 200 MG tablet Take 1 tablet (200 mg total) by mouth daily. 30 tablet 11  . apixaban (ELIQUIS) 5 MG TABS tablet Take 1 tablet (5 mg total) by mouth 2 (two) times daily. 60 tablet 3  . atorvastatin (LIPITOR) 10 MG tablet Take 1 tablet (10 mg total) by mouth daily. 30 tablet 11  . furosemide (LASIX) 40 MG tablet Take 1 tablet (40 mg total) by mouth 2 (two) times daily. 60 tablet 11  . lansoprazole (PREVACID) 30 MG capsule Take 30 mg by mouth daily.     Marland Kitchen loratadine (CLARITIN) 10 MG tablet Take 1 tablet (10 mg total) by mouth daily. 30 tablet 11  . metoprolol tartrate (LOPRESSOR) 25 MG tablet Take half a tablet (12.5 mg) twice daily 30 tablet 6  . Multiple Vitamin (MULTIVITAMIN WITH MINERALS) TABS tablet Take 1 tablet by mouth daily.    . nitroGLYCERIN (NITROSTAT) 0.4 MG SL tablet Place 1 tablet (0.4 mg total) under the tongue every 5 (five) minutes as needed. 25 tablet 6  . potassium chloride SA (K-DUR,KLOR-CON) 20 MEQ tablet Take 2 tablets (40 mEq total) by mouth daily. 60 tablet 11  . tamsulosin (FLOMAX) 0.4 MG CAPS capsule Take 0.4 mg by mouth 2 (two) times daily.       Allergies: Allergies as of 01/28/2015 - Review Complete 01/28/2015  Allergen Reaction Noted  . Lortab [hydrocodone-acetaminophen] Hives 05/30/2012  . Morphine and related Hives 05/18/2011   Past Medical History  Diagnosis Date  .  Hyperlipidemia   . DVT (deep venous thrombosis)     post knee surgery  . Tobacco abuse   . HTN (hypertension)   . Descending aortic aneurysm   . Aortic valve insufficiency   . Thoracic aneurysm   . S/P AVR (aortic valve replacement)   . CAD (coronary artery disease)   . Atrial flutter   . CHF (congestive heart failure)    Past Surgical History  Procedure Laterality Date  . Right groin lymphocele  [id number]  . Aortic valve replacement  [id number]  . Replacement total knee      bilateral  . Carotid-subclavian bypass  graft Left 08/26/2013    Procedure: BYPASS GRAFT CAROTID-SUBCLAVIAN;  Surgeon: Nada Libman, MD;  Location: [facility] OR;  Service: Vascular;  Laterality: Left;  . Endovascular stent insertion N/A 08/26/2013    Procedure:  THORACIC STENT GRAFT INSERTION;  Surgeon: Nada Libman, MD;  Location: [facility] OR;  Service: Vascular;  Laterality: N/A;   Family History  Problem Relation Age of Onset  . Celiac disease Daughter    Social History   Social History  . Marital Status: Married    Spouse Name: N/A  . Number of Children: N/A  . Years of Education: N/A   Occupational History  . Not on file.   Social History Main Topics  . Smoking status: Former Smoker    Types: Cigarettes    Quit date: 04/16/1974  . Smokeless tobacco: Never Used  . Alcohol Use: No  . Drug Use: No  . Sexual Activity: Not on file   Other Topics Concern  . Not on file   Social History Narrative    Review of Systems  Constitutional: Negative for fever, chills and weight loss.  HENT: Negative for hearing loss.   Eyes: Negative for blurred vision and double vision.  Cardiovascular: Positive for chest pain. Negative for orthopnea, claudication, leg swelling and PND.  Gastrointestinal: Negative for nausea, vomiting and abdominal pain.  Genitourinary: Negative for dysuria.  Skin: Negative for rash.  Neurological: Positive for dizziness and loss of consciousness. Negative for headaches.    Physical Exam: Blood pressure 163/56, pulse 63, temperature 97.7 F (36.5 C), temperature source Oral, resp. rate 18, weight 86.047 kg (189 lb 11.2 oz), SpO2 98 %. Physical Exam  Constitutional: He appears well-developed and well-nourished.  Eyes: Conjunctivae and EOM are normal.  Neck: Normal range of motion. Neck supple.  Cardiovascular: Normal rate, regular rhythm and normal heart sounds.  Exam reveals no gallop.   No murmur heard. Pulmonary/Chest: Effort normal and breath sounds normal. He has no wheezes. He has no rales.    Abdominal: Soft. Bowel sounds are normal. He exhibits no distension. There is no tenderness.  Musculoskeletal: Normal range of motion.  Neurological: He is alert.  Skin: Skin is warm and dry. He is not diaphoretic.  Psychiatric: He has a normal mood and affect. His behavior is normal.   Lab results: Basic Metabolic Panel:  Recent Labs  14/78/29 1352  NA 143  K 4.4  CL 106  CO2 29  GLUCOSE 146*  BUN 20  CREATININE 1.19  CALCIUM 8.8*   Liver Function Tests:  Recent Labs  01/28/15 1352  AST 27  ALT 18  ALKPHOS 58  BILITOT 0.8  PROT 6.6  ALBUMIN 3.9  CBC:  Recent Labs  01/28/15 1317  WBC 6.3  NEUTROABS 5.5  HGB 10.5*  HCT 33.8*  MCV 88.9  PLT 203   Cardiac Enzymes:  Recent Labs  01/28/15 1747  TROPONINI <0.03   Imaging results:  Dg Chest 2 View  01/28/2015   CLINICAL DATA:  Chest pain started this morning. History of hypertension and thoracic aortic stent graft.  EXAM: CHEST  2 VIEW  COMPARISON:  10/21/2013  FINDINGS: Stable positioning of the thoracic stent graft extending from the aortic arch to the distal descending thoracic aorta. Again noted is a prosthetic aortic valve. Heart size is stable and within normal limits. Both lungs are clear without airspace disease or pulmonary edema. Patient has median sternotomy wires. No acute bone abnormality.  IMPRESSION: No acute chest abnormality.  Stable postoperative changes.   Electronically Signed   By: Richarda Overlie M.D.   On: 01/28/2015 14:19   Ct Angio Abd/pel W/ And/or W/o  01/28/2015   CLINICAL DATA:  Chest pain  EXAM: CT ANGIOGRAPHY CHEST, ABDOMEN AND PELVIS WITH CONTRAST  TECHNIQUE: Multidetector CT imaging of the chest, abdomen and pelvis was performed using the standard protocol during bolus administration of intravenous contrast. Multiplanar CT image reconstructions and MIPs were obtained to evaluate the vascular anatomy.  CONTRAST:  OMNIPAQUE IOHEXOL 350 MG/ML SOLN  COMPARISON:  02/12/2014  FINDINGS:  Thorax:  The thoracic aortic stent graft remains stable in position. Maximal aneurysm sac diameter has decreased from 5.8 cm on the prior study, based on my direct measurements, to 5.0 cm today. There is no evidence of endoleak.  The left subclavian artery remains excluded with occlusion of the proximal segment. It reconstitutes just proximal to the takeoff of the left vertebral artery. Bypass graft from the left common carotid artery to the left carotid artery is patent.  Innominate artery, right subclavian artery, right common carotid artery are patent with atherosclerotic changes. The right vertebral artery is extremely diminutive and poorly opacifies. Left vertebral artery is dominant and is widely patent within the confines of the study. The distal left subclavian artery is patent.  Artificial aortic valve is in place. Scattered coronary artery calcifications are noted.  There is no obvious evidence of acute pulmonary thromboembolism.  There is no evidence of abnormal mediastinal or hilar adenopathy.  Lungs are clear.  No pneumothorax or pleural effusion.  No vertebral compression deformity.  Abdomen and pelvis:  At the diaphragmatic hiatus, maximal aortic diameter is 4.0 cm. In the infrarenal aorta, maximal AP and transverse diameters are 3.1 and 2.6 cm. Previously, maximal aortic diameter below the renal arteries was 3.0 cm.  Celiac is patent. Branch vessels are patent. Accessory left hepatic artery anatomy. No splenic artery aneurysm.  SMA is patent with mild atherosclerotic changes at its origin. Branch vessels are grossly patent  IMA is patent.  Branch vessels are patent  Single renal arteries are patent. Atherosclerotic calcifications at their origins are noted.  Right common, internal, and external iliac arteries are patent with mild atherosclerotic changes. Left common, external, and internal iliac arteries are patent with mild diffuse atherosclerotic calcifications.  Gallstones  Liver, spleen,  pancreas, adrenal glands are within normal limits.  Hyperdense cysts in the upper pole of the right kidney demonstrates no significant enhancement comparing pre and postcontrast images. Tiny hypodensity in the left kidney anteriorly on image 51 of series 601 is nonspecific. No hydronephrosis. No definite renal mass. Tiny cyst in the lower pole of the left kidney, anteriorly is noted on image 56.  Prostate is mildly enlarged extending through the base of the bladder. Bladder is otherwise within normal limits  Sigmoid diverticulosis without evidence of acute diverticulitis.  No free-fluid.  No abnormal adenopathy.  No vertebral compression deformity. 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  Review of the MIP images confirms the above findings.  IMPRESSION: Thoracic aortic stent graft is in place without complication. There is no endoleak. Aneurysm sac diameter in the thorax has diminished from 5.8 cm to 5.0 cm.  Stable appearance of the thoracic vasculature. The left subclavian artery origin has been excluded and a left carotid to subclavian artery bypass graft is patent. The left vertebral artery is dominant.  Ectasia of the infrarenal abdominal aorta has increased from 3.0 cm to 3.1 cm.  Cholelithiasis.   Electronically Signed   By: Jolaine Click M.D.   On: 01/28/2015 16:06   Other results: EKG: Sinus bradycardia, prolonged QT interval of 570, unchanged from prior EKGs  Assessment & Plan by Problem: Mr. Scheer is a 74 year-old man with an extensive cardiac history including multiple thoracic aortic aneurysms, coronary artery disease involving complete occlusion of his right coronary artery, essential hypertension, and paroxysmal atrial fibrillation, admitted for an episode of typical angina and syncope after receiving two doses of sublingual nitroglycerin. I suspect his syncope was likely caused by the nitroglycerin but this may have been a true arrhythmia. His QT was prolonged to 570 and  I am concerned this may have been torsades de pointes or perhaps another arrhythmia induced by ischemia. His EKG showed prolonged QT but no ischemia and troponins were negative times two thus far. Cardiology is recommending myocardial perfusion study; given his known RCA occlusion involving the inferior wall, angiography should be pursued only if lateral wall or anterior wall ischemia is noted.  Chest pain and syncope followed by nitroglycerin: Cardiology is following and recommending myocardial perfusion study tomorrow with angiography pending those results. -We will follow his troponins and EKG in the morning. -We will follow-up magnesium level -We will hold lansoprazole given its potential to prolong QT  Aortic valve replacement: No regurgitation per TTE in March 2015. -Cardiology recommended holding apixaban 5mg  twice daily and starting IV heparin drip  Paroxysmal atrial fibrillation: He is currently in normal sinus rhythm; he takes amiodarone 200mg  daily and metoprolol 12.5 BID. -We will monitor telemetry -Continue amiodarone and metoprolol  Diastolic heart failure: This seems to be well-controlled recently and he is euvolemic on exam. His last transesophageal echocardiogram in March 2015 showed ejecrion fraction 50%, grade 3 diastolic heart failure, normal systolic gradient over aortic bioprosthetic valve. He is on furosemide 40mg  twice daily. -Continue furosemide 40mg  BID  Hyperlipidemia: -Continue atorvastatin 10mg  daily  Dispo: Disposition is deferred at this time, awaiting improvement of current medical problems. Anticipated discharge in approximately 2-5 day(s).   The patient does have a current PCP [name], MD) and does need an [facility] hospital follow-up appointment after discharge.  The patient does not know have transportation limitations that hinder transportation to clinic appointments.  Signed: Selina Cooley, MD 01/28/2015, 7:56 PM

## 2015-01-28 NOTE — ED Notes (Signed)
Pt SOB with any movement

## 2015-01-29 ENCOUNTER — Encounter (HOSPITAL_COMMUNITY): Payer: Medicare Other

## 2015-01-29 ENCOUNTER — Observation Stay (HOSPITAL_COMMUNITY): Payer: Medicare Other

## 2015-01-29 DIAGNOSIS — I5032 Chronic diastolic (congestive) heart failure: Secondary | ICD-10-CM | POA: Diagnosis not present

## 2015-01-29 DIAGNOSIS — I2510 Atherosclerotic heart disease of native coronary artery without angina pectoris: Secondary | ICD-10-CM

## 2015-01-29 DIAGNOSIS — R079 Chest pain, unspecified: Secondary | ICD-10-CM | POA: Diagnosis not present

## 2015-01-29 DIAGNOSIS — I7100 Dissection of unspecified site of aorta: Secondary | ICD-10-CM

## 2015-01-29 DIAGNOSIS — Z954 Presence of other heart-valve replacement: Secondary | ICD-10-CM | POA: Diagnosis not present

## 2015-01-29 DIAGNOSIS — R55 Syncope and collapse: Secondary | ICD-10-CM | POA: Diagnosis not present

## 2015-01-29 LAB — TROPONIN I: Troponin I: <0.03 ng/mL (ref <0.031)

## 2015-01-29 LAB — COMPREHENSIVE METABOLIC PANEL
ALT: 17 U/L (ref 17–63)
AST: 24 U/L (ref 15–41)
Albumin: 3.5 g/dL (ref 3.5–5.0)
Alkaline Phosphatase: 53 U/L (ref 38–126)
Anion gap: 10 (ref 5–15)
BUN: 16 mg/dL (ref 6–20)
CO2: 27 mmol/L (ref 22–32)
CREATININE: 0.97 mg/dL (ref 0.61–1.24)
Calcium: 8.4 mg/dL — ABNORMAL LOW (ref 8.9–10.3)
Chloride: 105 mmol/L (ref 101–111)
GFR calc non Af Amer: >60 mL/min (ref >60)
Glucose, Bld: 95 mg/dL (ref 65–99)
Potassium: 3.5 mmol/L (ref 3.5–5.1)
Sodium: 142 mmol/L (ref 135–145)
Total Bilirubin: 0.9 mg/dL (ref 0.3–1.2)
Total Protein: 6.4 g/dL — ABNORMAL LOW (ref 6.5–8.1)

## 2015-01-29 LAB — MAGNESIUM
Magnesium: 2.5 mg/dL — ABNORMAL HIGH (ref 1.7–2.4)
Magnesium: 2.3 mg/dL (ref 1.7–2.4)

## 2015-01-29 LAB — CBC
HEMATOCRIT: 32.3 % — AB (ref 39.0–52.0)
HEMOGLOBIN: 9.8 g/dL — AB (ref 13.0–17.0)
MCH: 26.7 pg (ref 26.0–34.0)
MCHC: 30.3 g/dL (ref 30.0–36.0)
MCV: 88 fL (ref 78.0–100.0)
Platelets: 208 10*3/uL (ref 150–400)
RBC: 3.67 MIL/uL — AB (ref 4.22–5.81)
RDW: 16.2 % — ABNORMAL HIGH (ref 11.5–15.5)
WBC: 4.6 10*3/uL (ref 4.0–10.5)

## 2015-01-29 LAB — APTT
APTT: 48 s — AB (ref 24–37)
aPTT: 64 seconds — ABNORMAL HIGH (ref 24–37)

## 2015-01-29 LAB — HEPARIN LEVEL (UNFRACTIONATED): Heparin Unfractionated: 1.82 IU/mL — ABNORMAL HIGH (ref 0.30–0.70)

## 2015-01-29 MED ORDER — REGADENOSON 0.4 MG/5ML IV SOLN
0.4000 mg | Freq: Once | INTRAVENOUS | Status: AC
  Administered 2015-01-29: 0.4 mg via INTRAVENOUS
  Filled 2015-01-29: qty 5

## 2015-01-29 MED ORDER — PANTOPRAZOLE SODIUM 40 MG PO TBEC
40.0000 mg | DELAYED_RELEASE_TABLET | Freq: Every day | ORAL | Status: DC
  Administered 2015-01-29: 40 mg via ORAL
  Filled 2015-01-29: qty 1

## 2015-01-29 MED ORDER — REGADENOSON 0.4 MG/5ML IV SOLN
INTRAVENOUS | Status: DC
Start: 2015-01-29 — End: 2015-01-29
  Filled 2015-01-29: qty 5

## 2015-01-29 MED ORDER — TECHNETIUM TC 99M SESTAMIBI - CARDIOLITE
20.0000 | Freq: Once | INTRAVENOUS | Status: AC | PRN
  Administered 2015-01-29: 20 via INTRAVENOUS

## 2015-01-29 MED ORDER — TECHNETIUM TC 99M SESTAMIBI - CARDIOLITE
10.0000 | Freq: Once | INTRAVENOUS | Status: AC | PRN
  Administered 2015-01-29: 10 via INTRAVENOUS

## 2015-01-29 NOTE — H&P (Signed)
Internal Medicine Attending Admission Note Date: 01/29/2015  Patient name: [name] [name] Medical record number: [id number] Date of birth: Oct 16, 1940 Age: 74 y.o. Gender: male  I saw and evaluated the patient. I reviewed the resident's note and I agree with the resident's findings and plan as documented in the resident's note.  Chief Complaint(s): Chest pain and syncope.  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 His wife gave him a sublingual nitroglycerin but the pain did not resolve and she gave him another sublingual nitroglycerin. At this point he put his head down and was noted to be unresponsive. He was removed from the chair to the floor and EMS was called. He reportedly was unresponsive for 2-3 minutes. Upon awakening he was minimally confused. There were no tonic-clonic movements. He was transported to the emergency department for further evaluation and admitted to the internal medicine teaching service for further care. He has ruled out for myocardial infarction with serial enzymes and cardiology recommended a nuclear stress test which was just completed. The results are pending at this time. He is currently chest pain-free on a heparin drip and without other complaints.  Physical Exam - key components related to admission:  Filed Vitals:   01/29/15 1016 01/29/15 1018 01/29/15 1024 01/29/15 1207  BP: 108/49 109/50 122/57  139/52  Pulse: 81   67  Temp:      TempSrc:      Resp:      Height:      Weight:      SpO2:       Gen.: Well-developed, well-nourished, chronically ill-appearing man sitting comfortably in a chair in no acute distress. Lungs: Bibasilar inspiratory crackles without wheezes, rhonchi, or rales. Heart: Regular rate and rhythm with a 2/6 systolic murmur that is harsh and crescendo decrescendo best heard at the mid left sternal border.  Lab results:  Basic Metabolic Panel:  Recent Labs  46/96/29 1352 01/28/15 2242 01/29/15 0506  NA 143  --  142  K 4.4  --  3.5  CL 106  --  105  CO2 29  --  27  GLUCOSE 146*  --  95  BUN 20  --  16  CREATININE 1.19  --  0.97  CALCIUM 8.8*  --  8.4*  MG  --  2.5* 2.3   Liver Function Tests:  Recent Labs  01/28/15 1352 01/29/15 0506  AST 27 24  ALT 18 17  ALKPHOS 58 53  BILITOT 0.8 0.9  PROT 6.6 6.4*  ALBUMIN 3.9 3.5   CBC:  Recent Labs  01/28/15 1317 01/29/15 0506  WBC 6.3 4.6  NEUTROABS 5.5  --   HGB 10.5* 9.8*  HCT 33.8* 32.3*  MCV 88.9 88.0  PLT 203 208   Cardiac Enzymes:  Recent Labs  01/28/15 1747 01/28/15 2242 01/29/15 0506  TROPONINI <0.03 <0.03 <0.03   Coagulation:  Recent Labs  01/28/15 2242  INR 1.42   Misc. Labs:  BNP 278.9  Imaging results:  Dg Chest 2 View  01/28/2015   CLINICAL DATA:  Chest pain started this morning. History of hypertension and thoracic aortic stent graft.  EXAM: CHEST  2 VIEW  COMPARISON:  10/21/2013  FINDINGS: Stable positioning of the thoracic stent graft extending from the aortic arch to the distal descending thoracic aorta. Again noted is a prosthetic aortic valve. Heart size is stable and within normal limits. Both lungs are clear without airspace disease or pulmonary edema. Patient has median sternotomy wires. No acute bone abnormality.  IMPRESSION: No acute chest abnormality.  Stable postoperative changes.   Electronically Signed   By: Richarda Overlie M.D.   On: 01/28/2015 14:19    Ct Angio Chest Aorta W/cm &/or Wo/cm  01/28/2015   CLINICAL DATA:  Chest pain  EXAM: CT ANGIOGRAPHY CHEST, ABDOMEN AND PELVIS WITH CONTRAST  TECHNIQUE: Multidetector CT imaging of the chest, abdomen and pelvis was performed using the standard protocol during bolus administration of intravenous contrast. Multiplanar CT image reconstructions and MIPs were obtained to evaluate the vascular anatomy.  CONTRAST:  OMNIPAQUE IOHEXOL 350 MG/ML SOLN  COMPARISON:  02/12/2014  FINDINGS: Thorax:  The thoracic aortic stent graft remains stable in position. Maximal aneurysm sac diameter has decreased from 5.8 cm on the prior study, based on my direct measurements, to 5.0 cm today. There is no evidence of endoleak.  The left subclavian artery remains excluded with occlusion of the proximal segment. It reconstitutes just proximal to the takeoff of the left vertebral artery. Bypass graft from the left common carotid artery to the left carotid artery is patent.  Innominate artery, right subclavian artery, right common carotid artery are patent with atherosclerotic changes. The right vertebral artery is extremely diminutive and poorly opacifies. Left vertebral artery is dominant and is widely patent within the confines of the study. The distal left subclavian artery is patent.  Artificial aortic valve is in place. Scattered coronary artery calcifications are noted.  There is no obvious evidence of acute pulmonary thromboembolism.  There is no evidence of abnormal mediastinal or hilar adenopathy.  Lungs are clear.  No pneumothorax or pleural effusion.  No vertebral compression deformity.  Abdomen and pelvis:  At the diaphragmatic hiatus, maximal aortic diameter is 4.0 cm. In the infrarenal aorta, maximal AP and transverse diameters are 3.1 and 2.6 cm. Previously, maximal aortic diameter below the renal arteries was 3.0 cm.  Celiac is patent. Branch vessels are patent. Accessory left hepatic artery anatomy. No splenic artery  aneurysm.  SMA is patent with mild atherosclerotic changes at its origin. Branch vessels are grossly patent  IMA is patent.  Branch vessels are patent  Single renal arteries are patent. Atherosclerotic calcifications at their origins are noted.  Right common, internal, and external iliac arteries are patent with mild atherosclerotic changes. Left common, external, and internal iliac arteries are patent with mild diffuse atherosclerotic calcifications.  Gallstones  Liver, spleen, pancreas, adrenal glands are within normal limits.  Hyperdense cysts in the upper pole of the right kidney demonstrates no significant enhancement comparing pre and postcontrast images. Tiny hypodensity in the left kidney anteriorly on image 51 of series 601 is nonspecific. No hydronephrosis. No definite renal mass. Tiny cyst in the lower pole of the left kidney, anteriorly is noted on image 56.  Prostate is mildly enlarged extending through the base of the bladder. Bladder is otherwise within normal limits  Sigmoid diverticulosis without evidence of acute diverticulitis.  No free-fluid.  No abnormal adenopathy.  No vertebral compression deformity.  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  Review of the MIP images confirms the above findings.  IMPRESSION: Thoracic aortic stent graft is in place without complication. There is no endoleak. Aneurysm sac diameter in the thorax has diminished from 5.8 cm to 5.0 cm.  Stable appearance of the thoracic vasculature. The left subclavian artery origin has been excluded and a left carotid to subclavian artery bypass graft is patent. The left vertebral artery is dominant.  Ectasia of the infrarenal abdominal aorta has increased from 3.0 cm to 3.1 cm.  Cholelithiasis.   Electronically Signed   By: Jolaine Click M.D.   On: 01/28/2015 16:06   Ct Angio Abd/pel W/ And/or W/o  01/28/2015   CLINICAL DATA:  Chest pain  EXAM: CT ANGIOGRAPHY CHEST, ABDOMEN AND PELVIS WITH CONTRAST  TECHNIQUE:  Multidetector CT imaging of the chest, abdomen and pelvis was performed using the standard protocol during bolus administration of intravenous contrast. Multiplanar CT image reconstructions and MIPs were obtained to evaluate the vascular anatomy.  CONTRAST:  OMNIPAQUE IOHEXOL 350 MG/ML SOLN  COMPARISON:  02/12/2014  FINDINGS: Thorax:  The thoracic aortic stent graft remains stable in position. Maximal aneurysm sac diameter has decreased from 5.8 cm on the prior study, based on my direct measurements, to 5.0 cm today. There is no evidence of endoleak.  The left subclavian artery remains excluded with occlusion of the proximal segment. It reconstitutes just proximal to the takeoff of the left vertebral artery. Bypass graft from the left common carotid artery to the left carotid artery is patent.  Innominate artery, right subclavian artery, right common carotid artery are patent with atherosclerotic changes. The right vertebral artery is extremely diminutive and poorly opacifies. Left vertebral artery is dominant and is widely patent within the confines of the study. The distal left subclavian artery is patent.  Artificial aortic valve is in place. Scattered coronary artery calcifications are noted.  There is no obvious evidence of acute pulmonary thromboembolism.  There is no evidence of abnormal mediastinal or hilar adenopathy.  Lungs are clear.  No pneumothorax or pleural effusion.  No vertebral compression deformity.  Abdomen and pelvis:  At the diaphragmatic hiatus, maximal aortic diameter is 4.0 cm. In the infrarenal aorta, maximal AP and transverse diameters are 3.1 and 2.6 cm. Previously, maximal aortic diameter below the renal arteries was 3.0 cm.  Celiac is patent. Branch vessels are patent. Accessory left hepatic artery anatomy. No splenic artery aneurysm.  SMA is patent with mild atherosclerotic changes at its origin. Branch vessels are grossly patent  IMA is patent.  Branch vessels are patent  Single  renal arteries are patent. Atherosclerotic calcifications at their origins are noted.  Right common, internal, and external iliac arteries are patent with mild atherosclerotic changes. Left common, external, and internal iliac arteries are patent with mild diffuse atherosclerotic calcifications.  Gallstones  Liver, spleen, pancreas, adrenal glands are within normal limits.  Hyperdense cysts in the upper pole of the right kidney demonstrates no significant enhancement comparing pre and postcontrast images. Tiny hypodensity in the left kidney anteriorly on image 51 of series 601 is nonspecific. No hydronephrosis. No definite renal mass. Tiny cyst in the lower pole of the left kidney, anteriorly is noted on image 56.  Prostate is mildly enlarged extending through the base of the bladder. Bladder is otherwise within normal limits  Sigmoid diverticulosis without evidence of acute diverticulitis.  No free-fluid.  No abnormal adenopathy.  No vertebral compression deformity. Advanced degenerative  disc disease at L3-4, L4-5, and L5-S1.  Review of the MIP images confirms the above findings.  IMPRESSION: Thoracic aortic stent graft is in place without complication. There is no endoleak. Aneurysm sac diameter in the thorax has diminished from 5.8 cm to 5.0 cm.  Stable appearance of the thoracic vasculature. The left subclavian artery origin has been excluded and a left carotid to subclavian artery bypass graft is patent. The left vertebral artery is dominant.  Ectasia of the infrarenal abdominal aorta has increased from 3.0 cm to 3.1 cm.  Cholelithiasis.   Electronically Signed   By: Jolaine Click M.D.   On: 01/28/2015 16:06   Other results:  EKG: Normal sinus bradycardia at 54 bpm, normal axis, QT prolongation, first-degree AV block, Q wave in V1, no LVH by voltage, good R wave progression, no ST or T-wave changes. This ECG is unchanged from the previous ECG on 10/21/2013.  Assessment & Plan by Problem:  Mr. Freimark is  a 74 year old man with a history of coronary artery disease status post a bare-metal stent in the left circumflex in 2012, paroxysmal atrial fibrillation, hypertension, diastolic heart failure, and extensive peripheral vascular occlusive disease who presents with typical anginal chest pain and syncope. He has ruled out for myocardial infarction with serial enzymes and ECGs. Further evaluation for evidence of ischemia included a nuclear stress test which was just completed. The results are pending at this time. He is currently hemodynamically stable without chest pain.  1) Chest pain with syncope: We will follow-up on the results of the nuclear stress test which was just completed. If it demonstrates ischemia we will proceed to cardiac catheterization to further define his anatomy. In the meantime, we will continue with the heparin drip until the results are known. We are also continuing the amiodarone, Lasix, atorvastatin, and metoprolol.  2) Disposition: Pending the results of the nuclear stress test and the need for further evaluation or intervention.

## 2015-01-29 NOTE — Progress Notes (Signed)
Pt a/o, no c/o pain, pt had stress test today and is stable will be dc to home with wife, VSS

## 2015-01-29 NOTE — Discharge Instructions (Signed)
Angina Pectoris  Angina pectoris, often just called angina, is extreme discomfort in your chest, neck, or arm caused by a lack of blood in the middle and thickest layer of your heart wall (myocardium). It may feel like tightness or heavy pressure. It may feel like a crushing or squeezing pain. Some people say it feels like gas or indigestion. It may go down your shoulders, back, and arms. Some people may have symptoms other than pain. These symptoms include fatigue, shortness of breath, cold sweats, or nausea. There are four different types of angina:  · Stable angina--Stable angina usually occurs in episodes of predictable frequency and duration. It usually is brought on by physical activity, emotional stress, or excitement. These are all times when the myocardium needs more oxygen. Stable angina usually lasts a few minutes and often is relieved by taking a medicine that can be taken under your tongue (sublingually). The medicine is called nitroglycerin. Stable angina is caused by a buildup of plaque inside the arteries, which restricts blood flow to the heart muscle (atherosclerosis).  · Unstable angina--Unstable angina can occur even when your body experiences little or no physical exertion. It can occur during sleep. It can also occur at rest. It can suddenly increase in severity or frequency. It might not be relieved by sublingual nitroglycerin. It can last up to 30 minutes. The most common cause of unstable angina is a blood clot that has developed on the top of plaque buildup inside a coronary artery. It can lead to a heart attack if the blood clot completely blocks the artery.  · Microvascular angina--This type of angina is caused by a disorder of tiny blood vessels called arterioles. Microvascular angina is more common in women. The pain may be more severe and last longer than other types of angina pectoris.  · Prinzmetal or variant angina--This type of angina pectoris usually occurs when your body  experiences little or no physical exertion. It especially occurs in the early morning hours. It is caused by a spasm of your coronary artery.  HOME CARE INSTRUCTIONS   · Only take over-the-counter and prescription medicines as directed by your health care provider.  · Stay active or increase your exercise as directed by your health care provider.  · Limit strenuous activity as directed by your health care provider.  · Limit heavy lifting as directed by your health care provider.  · Maintain a healthy weight.  · Learn about and eat heart-healthy foods.  · Do not use any tobacco products including cigarettes, chewing tobacco or electronic cigarettes.  SEEK IMMEDIATE MEDICAL CARE IF:   You experience the following symptoms:  · Chest, neck, deep shoulder, or arm pain or discomfort that lasts more than a few minutes.  · Chest, neck, deep shoulder, or arm pain or discomfort that goes away and comes back, repeatedly.  · Heavy sweating with discomfort, without a noticeable cause.  · Shortness of breath or difficulty breathing.  · Angina that does not get better after a few minutes of rest or after taking sublingual nitroglycerin.  These can all be symptoms of a heart attack, which is a medical emergency! Get medical help at once. Call your local emergency service (911 in [place] immediately. Do not  drive yourself to the hospital and do not  wait to for your symptoms to go away.  MAKE SURE YOU:  · Understand these instructions.  · Will watch your condition.  · Will get help right away if you are not   doing well or get worse.  Document Released: 06/05/2005 Document Revised: 06/10/2013 Document Reviewed: 10/07/2013  [employer]® Patient Information ©2015 [employer]. This information is not intended to replace advice given to you by your health care provider. Make sure you discuss any questions you have with your health care provider.

## 2015-01-29 NOTE — Progress Notes (Signed)
Patient Profile: 74 y/o male with h/o CAD and aortic disease, s/p PCI + BMS to the circumflex in 2012 for ACS and total occlusion of the RCA. 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 He also has a history of type B aortic dissection and is undergone Endo-therapy of the dissection and left subclavian to left carotid bypass. He has a history of paroxysmal atrial fibrillation now with rhythm control, essential hypertension, and diastolic heart failure. He was admitted for chest pain and near syncope.   Subjective: No complaints today. Currently CP free.   Objective: Vital signs in last 24 hours: Temp:  [97.7 F (36.5 C)-98.4 F (36.9 C)] 98.1 F (36.7 C) (08/12 0521) Pulse Rate:  [57-81] 81 (08/12 1016) Resp:  [13-24] 18 (08/12 0521) BP: (108-186)/(42-70) 122/57 mmHg (08/12 1024) SpO2:  [96 %-100 %] 97 % (08/12 0521) Weight:  [187 lb 11.2 oz (85.14 kg)-189 lb 11.2 oz (86.047 kg)] 188 lb 4.4 oz (85.4 kg) (08/12 0521) Last BM Date: 01/27/15  Intake/Output from previous day: 08/11 0701 - 08/12 0700 In: 162 [P.O.:120; I.V.:42] Out: 880 [Urine:880] Intake/Output this shift: Total I/O In: -  Out: 320 [Urine:320]  Medications Current Facility-Administered Medications  Medication Dose Route Frequency Provider Last Rate Last Dose  . amiodarone (PACERONE) tablet 200 mg  200 mg Oral Daily Yolanda Manges, DO      . atorvastatin (LIPITOR) tablet 10 mg  10 mg Oral q1800 Yolanda Manges, DO      . furosemide (LASIX) tablet 40 mg  40 mg Oral BID Yolanda Manges, DO   40 mg at 01/29/15 0733  . heparin ADULT infusion 100 units/mL (25000 units/250 mL)  1,450 Units/hr Intravenous Continuous Titus Mould, RPH 14.5 mL/hr at 01/29/15 0701 1,450 Units/hr at 01/29/15 0701  . loratadine (CLARITIN) tablet 10 mg  10 mg Oral Daily Yolanda Manges, DO      . metoprolol tartrate (LOPRESSOR) tablet 12.5 mg  12.5 mg Oral BID Yolanda Manges, DO    12.5 mg at 01/28/15 2151  . multivitamin with minerals tablet 1 tablet  1 tablet Oral Daily Yolanda Manges, DO      . nitroGLYCERIN (NITROSTAT) SL tablet 0.4 mg  0.4 mg Sublingual Q5 min PRN Yolanda Manges, DO      . potassium chloride SA (K-DUR,KLOR-CON) CR tablet 40 mEq  40 mEq Oral Daily Yolanda Manges, DO      . regadenoson [facility]) 0.4 MG/5ML injection SOLN           . sodium chloride 0.9 % injection 3 mL  3 mL Intravenous Q12H Yolanda Manges, DO   3 mL at 01/28/15 2243  . sodium chloride 0.9 % injection 3 mL  3 mL Intravenous Q12H Yolanda Manges, DO   3 mL at 01/28/15 2243  . tamsulosin (FLOMAX) capsule 0.4 mg  0.4 mg Oral BID Yolanda Manges, DO   0.4 mg at 01/28/15 2151    PE: General appearance: alert, cooperative and no distress Neck: no carotid bruit and no JVD Lungs: clear to auscultation bilaterally Heart: regular rate and rhythm and 2/6 SM best heard along the LSB Extremities: no LEE Pulses: 2+ and symmetric Skin: warm and dry Neurologic: Grossly normal  Lab Results:   Recent Labs  01/28/15 1317 01/29/15 0506  WBC 6.3 4.6  HGB 10.5* 9.8*  HCT 33.8* 32.3*  PLT 203 208  BMET  Recent Labs  01/28/15 1352 01/29/15 0506  NA 143 142  K 4.4 3.5  CL 106 105  CO2 29 27  GLUCOSE 146* 95  BUN 20 16  CREATININE 1.19 0.97  CALCIUM 8.8* 8.4*   PT/INR  Recent Labs  01/28/15 2242  LABPROT 17.5*  INR 1.42   Cardiac Panel (last 3 results)  Recent Labs  01/28/15 1747 01/28/15 2242 01/29/15 0506  TROPONINI <0.03 <0.03 <0.03    Studies/Results: NST - results pending   Assessment/Plan  Active Problems:   HTN (hypertension)   Aortic dissection   CAD (coronary artery disease)   S/P AVR (aortic valve replacement)   Atrial fibrillation   Chest pain   Near syncope   Chronic diastolic heart failure    1. Chest Pain: cardiac enzymes are negative x 3. NST pending.   2. Syncope after sublingual nitroglycerin. Bp now stable.  3. Aortic disease with  ascending aneurysm treated with aortic bioprosthetic valve replacement, ascending and arch replacement 2010 (Bentall procedure).  4. 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  5. History of paroxysmal atrial fibrillation, 2015. Rhythm control on amiodarone  6. Diastolic heart failure, with most recent EF 50% March 2015 7. Chronic anticoagulation therapy with Eliquis  8. Long QT on ECG   Brittainy M. Delmer Islam 01/29/2015 10:32 AM   Patient seen and examined. Agree with assessment and plan. Felt well during NST. No chest pain or dyspnea.1-2 SEM aortic area without AR on exam   Results pending.   Lennette Bihari, MD, [facility] 01/29/2015 3:13 PM

## 2015-01-29 NOTE — Progress Notes (Signed)
Patient ID: [name] [name], male   DOB: [birth year], 74 y.o.   MRN: [id number].   Subjective: [name] [name] was undergoing myocardial perfusion study this morning so we were unable to speak with him on rounds.  However I checked on him after his study and he said he was feeling well as he ate his dinner. He and his family were awaiting cardiology's approval to go home. No chest pain.  Objective: Vital signs in last 24 hours: Filed Vitals:   01/29/15 1015 01/29/15 1016 01/29/15 1018 01/29/15 1024  BP:  108/49 109/50 122/57  Pulse: 76 81    Temp:      TempSrc:      Resp:      Height:      Weight:      SpO2:       Physical Exam: General: sitting in his recliner eating dinner Cardiac: slightly bradycardic with 2/6 systolic blowing murmur Pulm: subtle bibasilar inspiratory crackles. No rales or wheezes. Moving normal volumes of air Ext: warm and well perfused, no pedal edema   Lab Results: Basic Metabolic Panel:  Recent Labs Lab 01/28/15 1352 01/28/15 2242 01/29/15 0506  NA 143  --  142  K 4.4  --  3.5  CL 106  --  105  CO2 29  --  27  GLUCOSE 146*  --  95  BUN 20  --  16  CREATININE 1.19  --  0.97  CALCIUM 8.8*  --  8.4*  MG  --  2.5* 2.3   Cardiac Enzymes:  Recent Labs Lab 01/28/15 1747 01/28/15 2242 01/29/15 0506  TROPONINI <0.03 <0.03 <0.03   Studies/Results:  Ct Angio Abd/pel W/ And/or W/o 01/28/2015 IMPRESSION: Thoracic aortic stent graft is in place without complication. There is no endoleak. Aneurysm sac diameter in the thorax has diminished from 5.8 cm to 5.0 cm.  Stable appearance of the thoracic vasculature. The left subclavian artery origin has been excluded and a left carotid to subclavian artery bypass graft is patent. The left vertebral artery is dominant.  Ectasia of the infrarenal abdominal aorta has increased from 3.0 cm to 3.1 cm.  Cholelithiasis.   Electronically Signed   By: Jolaine Click M.D.   On: 01/28/2015 16:06   Medications: I have  reviewed the patient's current medications.  Scheduled Meds: . amiodarone  200 mg Oral Daily  . atorvastatin  10 mg Oral q1800  . furosemide  40 mg Oral BID  . loratadine  10 mg Oral Daily  . metoprolol tartrate  12.5 mg Oral BID  . multivitamin with minerals  1 tablet Oral Daily  . potassium chloride SA  40 mEq Oral Daily  . regadenoson      . sodium chloride  3 mL Intravenous Q12H  . sodium chloride  3 mL Intravenous Q12H  . tamsulosin  0.4 mg Oral BID   Continuous Infusions: . heparin 1,450 Units/hr (01/29/15 0701)   PRN Meds:.nitroGLYCERIN  Assessment/Plan: Chest pain and syncope followed by nitroglycerin: Cardiology is following and recommending myocardial perfusion study today with angiography pending those results. His telemetry showed no arrhythmias overnight, his EKG was unchanged this morning, and his troponins have been negative times three. If his myocardial perfusion does not show new ischemia, and cardiology clears him, he may be discharged today. 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 -Cardiology recommended holding apixaban 5mg  twice daily and starting IV heparin drip -We will re-start his apixaban  following cath   Paroxysmal atrial fibrillation: He is currently in normal sinus rhythm; he takes amiodarone  daily and metoprolol 12.5 BID. -We will monitor telemetry -Continue amiodarone and metoprolol  Diastolic heart failure:  -Continue furosemide  BID  Hyperlipidemia: -Continue atorvastatin  daily  Dispo: Disposition is deferred at this time, awaiting improvement of current medical problems.  Anticipated discharge in approximately 2-5 day(s).   The patient does have a current PCP [name], MD) and does need an [facility] hospital follow-up appointment after discharge.  The patient does have transportation limitations that hinder transportation to clinic  appointments.  .Services Needed at time of discharge: Y = Yes, Blank = No PT:   OT:   RN:   Equipment:   Other:       Selina Cooley, MD 01/29/2015, 11:28 AM

## 2015-01-29 NOTE — Progress Notes (Signed)
ANTICOAGULATION CONSULT NOTE - Follow-up Consult  Pharmacy Consult for Heparin Indication: chest pain/ACS and atrial fibrillation  Allergies  Allergen Reactions  . Lortab [Hydrocodone-Acetaminophen] Hives  . Morphine And Related Hives   Patient Measurements: Height: 5\' 11"  (180.3 cm) Weight: 188 lb 4.4 oz (85.4 kg) IBW/kg (Calculated) : 75.3 Heparin Dosing Weight: 86 kg  Vital Signs: Temp: 97.7 F (36.5 C) (08/12 1300) Temp Source: Oral (08/12 1300) BP: 142/39 mmHg (08/12 1300) Pulse Rate: 62 (08/12 1300)  Labs:  Recent Labs  01/28/15 1317 01/28/15 1352 01/28/15 1747 01/28/15 2242 01/29/15 0506 01/29/15 1504  HGB 10.5*  --   --   --  9.8*  --   HCT 33.8*  --   --   --  32.3*  --   PLT 203  --   --   --  208  --   APTT  --   --   --   --  48* 64*  LABPROT  --   --   --  17.5*  --   --   INR  --   --   --  1.42  --   --   HEPARINUNFRC  --   --   --  >2.20* 1.82*  --   CREATININE  --  1.19  --   --  0.97  --   TROPONINI  --   --  <0.03 <0.03 <0.03  --    Estimated Creatinine Clearance: 70.1 mL/min (by C-G formula based on Cr of 0.97).  Assessment: 74 yr old male on IV heparin for chest pain/ACS. Pt on Eliquis PTA (last dose 8/11 ~0900) - currently on hold for possible cath. For lexiscan myocardia perfusion study today. Heparin level remains elevated 1.84 (Eliquis affecting) so using aPTT to monitor heparin. APTT 64 seconds which is just below desired goal range on 1450 units/hr.  No noted bleeding complications.  Goal of Therapy:  Heparin level 0.3-0.7 units/ml aPTT 66-102 seconds Monitor platelets by anticoagulation protocol: Yes   Plan:  Increase heparin gtt to 1500 units/hr F/u AM aPTT  Nadara Mustard, PharmD., MS Clinical Pharmacist Pager:  [phone] Thank you for allowing pharmacy to be part of this patients care team. 01/29/2015,4:19 PM

## 2015-01-29 NOTE — Progress Notes (Addendum)
ANTICOAGULATION CONSULT NOTE - Follow-up Consult  Pharmacy Consult for Heparin Indication: chest pain/ACS and atrial fibrillation  Allergies  Allergen Reactions  . Lortab [Hydrocodone-Acetaminophen] Hives  . Morphine And Related Hives    Patient Measurements: Height: 5\' 11"  (180.3 cm) Weight: 188 lb 4.4 oz (85.4 kg) IBW/kg (Calculated) : 75.3 Heparin Dosing Weight: 86 kg  Vital Signs: Temp: 98.1 F (36.7 C) (08/12 0521) Temp Source: Oral (08/12 0521) BP: 159/50 mmHg (08/12 0521) Pulse Rate: 60 (08/12 0521)  Labs:  Recent Labs  01/28/15 1317 01/28/15 1352 01/28/15 1747 01/28/15 2242 01/29/15 0506  HGB 10.5*  --   --   --  9.8*  HCT 33.8*  --   --   --  32.3*  PLT 203  --   --   --  208  APTT  --   --   --   --  48*  LABPROT  --   --   --  17.5*  --   INR  --   --   --  1.42  --   HEPARINUNFRC  --   --   --  >2.20*  --   CREATININE  --  1.19  --   --  0.97  TROPONINI  --   --  <0.03 <0.03 <0.03    Estimated Creatinine Clearance: 70.1 mL/min (by C-G formula based on Cr of 0.97).   Assessment: 74 yr old male on IV heparin for chest pain/ACS. Pt on Eliquis PTA (last dose 8/11 ~0900) - currently on hold for possible cath. For lexiscan myocardia perfusion study today. Heparin level remains elevated 1.84 (Eliquis affecting) so using aPTT to monitor heparin. APTT 48 seconds (subtherapeutic) on heparin 1200 units/hr. Hgb down a bit, no bleeding noted.  Goal of Therapy:  Heparin level 0.3-0.7 units/ml aPTT 66-102 seconds Monitor platelets by anticoagulation protocol: Yes   Plan:  Increase heparin gtt to 1450 units/hr F/u 8 hr aPTT  Christoper Fabian, PharmD, BCPS Clinical pharmacist, pager [phone] 01/29/2015,6:40 AM

## 2015-01-29 NOTE — Discharge Summary (Signed)
Name: [name] [name] MRN: [id number] DOB: 24-Nov-1940 74 y.o. PCP: [name] Pearson, MD  Date of Admission: 01/28/2015 12:01 PM Date of Discharge: 01/29/2015 Attending Physician: Doneen Poisson, MD  Discharge Diagnosis: 1. Chest pain and syncope 3. Paroxysmal atrial fibrillation 4. Diastolic heart failure  Discharge Medications:   Medication List    STOP taking these medications        lansoprazole 30 MG capsule  Commonly known as:  PREVACID      TAKE these medications        amiodarone 200 MG tablet  Commonly known as:  PACERONE  Take 1 tablet (200 mg total) by mouth daily.     apixaban 5 MG Tabs tablet  Commonly known as:  ELIQUIS  Take 1 tablet (5 mg total) by mouth 2 (two) times daily.     atorvastatin 10 MG tablet  Commonly known as:  LIPITOR  Take 1 tablet (10 mg total) by mouth daily.     furosemide 40 MG tablet  Commonly known as:  LASIX  Take 1 tablet (40 mg total) by mouth 2 (two) times daily.     loratadine 10 MG tablet  Commonly known as:  CLARITIN  Take 1 tablet (10 mg total) by mouth daily.     metoprolol tartrate 25 MG tablet  Commonly known as:  LOPRESSOR  Take half a tablet (12.5 mg) twice daily     multivitamin with minerals Tabs tablet  Take 1 tablet by mouth daily.     nitroGLYCERIN 0.4 MG SL tablet  Commonly known as:  NITROSTAT  Place 1 tablet (0.4 mg total) under the tongue every 5 (five) minutes as needed.     potassium chloride SA 20 MEQ tablet  Commonly known as:  K-DUR,KLOR-CON  Take 2 tablets (40 mEq total) by mouth daily.     tamsulosin 0.4 MG Caps capsule  Commonly known as:  FLOMAX  Take 0.4 mg by mouth 2 (two) times daily.        Disposition and follow-up:   [name] was discharged from [facility] in Stable condition.  At the hospital follow up visit please address:  1.  Resolution of chest pain and syncope  2.  Labs / imaging needed at time of follow-up: None  3.  Pending labs/  test needing follow-up: None  Consultations: Cardiology  Procedures Performed:  Dg Chest 2 View  01/28/2015   CLINICAL DATA:  Chest pain started this morning. History of hypertension and thoracic aortic stent graft.  EXAM: CHEST  2 VIEW  COMPARISON:  10/21/2013  FINDINGS: Stable positioning of the thoracic stent graft extending from the aortic arch to the distal descending thoracic aorta. Again noted is a prosthetic aortic valve. Heart size is stable and within normal limits. Both lungs are clear without airspace disease or pulmonary edema. Patient has median sternotomy wires. No acute bone abnormality.  IMPRESSION: No acute chest abnormality.  Stable postoperative changes.   Electronically Signed   By: Richarda Overlie M.D.   On: 01/28/2015 14:19   Nm Myocar Multi W/spect W/wall Motion / Ef  01/29/2015    Defect 1: There is a medium defect of moderate severity present in the  basal inferior, basal inferolateral, mid inferior, mid inferolateral and  apical lateral location. This is a fixed defect. No ischemia noted.  This is an intermediate risk study.  The left ventricular ejection fraction is mildly decreased (45-54%).  Nuclear stress EF: 46%.  Baseline ST/T wave  abnormality in the inferolateral leads with no change  from baseline during Lexiscan infusion.    Ct Angio Abd/pel W/ And/or W/o  01/28/2015   CLINICAL DATA:  Chest pain  EXAM: CT ANGIOGRAPHY CHEST, ABDOMEN AND PELVIS WITH CONTRAST  TECHNIQUE: Multidetector CT imaging of the chest, abdomen and pelvis was performed using the standard protocol during bolus administration of intravenous contrast. Multiplanar CT image reconstructions and MIPs were obtained to evaluate the vascular anatomy.  CONTRAST:  OMNIPAQUE IOHEXOL 350 MG/ML SOLN  COMPARISON:  02/12/2014  FINDINGS: Thorax:  The thoracic aortic stent graft remains stable in position. Maximal aneurysm sac diameter has decreased from 5.8 cm on the prior study, based on my direct  measurements, to 5.0 cm today. There is no evidence of endoleak.  The left subclavian artery remains excluded with occlusion of the proximal segment. It reconstitutes just proximal to the takeoff of the left vertebral artery. Bypass graft from the left common carotid artery to the left carotid artery is patent.  Innominate artery, right subclavian artery, right common carotid artery are patent with atherosclerotic changes. The right vertebral artery is extremely diminutive and poorly opacifies. Left vertebral artery is dominant and is widely patent within the confines of the study. The distal left subclavian artery is patent.  Artificial aortic valve is in place. Scattered coronary artery calcifications are noted.  There is no obvious evidence of acute pulmonary thromboembolism.  There is no evidence of abnormal mediastinal or hilar adenopathy.  Lungs are clear.  No pneumothorax or pleural effusion.  No vertebral compression deformity.  Abdomen and pelvis:  At the diaphragmatic hiatus, maximal aortic diameter is 4.0 cm. In the infrarenal aorta, maximal AP and transverse diameters are 3.1 and 2.6 cm. Previously, maximal aortic diameter below the renal arteries was 3.0 cm.  Celiac is patent. Branch vessels are patent. Accessory left hepatic artery anatomy. No splenic artery aneurysm.  SMA is patent with mild atherosclerotic changes at its origin. Branch vessels are grossly patent  IMA is patent.  Branch vessels are patent  Single renal arteries are patent. Atherosclerotic calcifications at their origins are noted.  Right common, internal, and external iliac arteries are patent with mild atherosclerotic changes. Left common, external, and internal iliac arteries are patent with mild diffuse atherosclerotic calcifications.  Gallstones  Liver, spleen, pancreas, adrenal glands are within normal limits.  Hyperdense cysts in the upper pole of the right kidney demonstrates no significant enhancement comparing pre and  postcontrast images. Tiny hypodensity in the left kidney anteriorly on image 51 of series 601 is nonspecific. No hydronephrosis. No definite renal mass. Tiny cyst in the lower pole of the left kidney, anteriorly is noted on image 56.  Prostate is mildly enlarged extending through the base of the bladder. Bladder is otherwise within normal limits  Sigmoid diverticulosis without evidence of acute diverticulitis.  No free-fluid.  No abnormal adenopathy.  No vertebral compression deformity. 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  Review of the MIP images confirms the above findings.  IMPRESSION: Thoracic aortic stent graft is in place without complication. There is no endoleak. Aneurysm sac diameter in the thorax has diminished from 5.8 cm to 5.0 cm.  Stable appearance of the thoracic vasculature. The left subclavian artery origin has been excluded and a left carotid to subclavian artery bypass graft is patent. The left vertebral artery is dominant.  Ectasia of the infrarenal abdominal aorta has increased from 3.0 cm to 3.1 cm.  Cholelithiasis.   Electronically  Signed   By: Jolaine Click M.D.   On: 01/28/2015 16:06   Admission HPI:  Mr. 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 His wife gave him a sublingual nitroglycerin but the pain did not resolve and she gave him another sublingual nitroglycerin. At this point he put his head down and was noted to be unresponsive. He was removed  from the chair to the floor and EMS was called. He reportedly was unresponsive for 2-3 minutes. Upon awakening he was minimally confused. There were no tonic-clonic movements. He was transported to the emergency department for further evaluation and admitted to the internal medicine teaching service for further care. He has ruled out for myocardial infarction with serial enzymes and cardiology recommended a nuclear stress test. The results are pending at this time. He is currently chest pain-free on a heparin drip and without other complaints.  Hospital Course by problem list:  1. Chest pain and syncope: Mr. [name] [name] is a 74 year-old man with an extensive cardiac history including multiple thoracic aortic aneurysms, coronary artery disease involving complete occlusion of his right coronary artery, essential hypertension, and paroxysmal atrial fibrillation, admitted for an episode of typical angina and syncope after receiving two doses of sublingual nitroglycerin. In the ED, he was asymptomatic, hemodynamically stable, his EKG was was unchanged from prior, without ischemic changes, but notably had a prolonged QT interval of 570. A CT angiogram of his chest, abdomen, and pelvis was negative for leaks from his prior aortic surgeries. We discontinued his pantoprazole given its propensity to increase the QT interval. We ruled out cardiac ischemia given with three values of negative troponins. The following day (01/29/2015) per Cardiology's recommendations, he underwent a myocardial perfusion study that showed a defect of moderate severity in the basal inferior, basal inferolateral, mid inferior, mid inferolateral and apical lateral locations, consistent with his known occlusion of the right coronary artery from coronary angiogram in 2014. He was discharged home asymptomatic, ruled out for myocardial ischemia with negative three sets of negative troponins, and without new ischemic changes noted on his myocardial  perfusion study performed. His telemetry was unremarkable for arrhythmias throughout his stay.   2. Paroxysmal atrial fibrillation with rhythm control: We continued his home medication regiment and he did not show any runs of atrial fibrillation on telemetry during his stay.  3. Diastolic heart failure: His heart failure was well-compensated during his stay; he appeared euvolemic on exam and had been compliant with his diet and medication regiment at home.   Discharge Vitals:   BP 142/39 mmHg  Pulse 62  Temp(Src) 97.7 F (36.5 C) (Oral)  Resp 18  Ht 5\' 11"  (1.803 m)  Wt 85.4 kg (188 lb 4.4 oz)  BMI 26.27 kg/m2  SpO2 98%  Discharge Labs:  Results for orders placed or performed during the hospital encounter of 01/28/15 (from the past 24 hour(s))  Troponin I     Status: None   Collection Time: 01/28/15  5:47 PM  Result Value Ref Range   Troponin I <0.03 <0.031  ng/mL  Troponin I (q 6hr x 3)     Status: None   Collection Time: 01/28/15 10:42 PM  Result Value Ref Range   Troponin I <0.03 <0.031 ng/mL  Heparin level (unfractionated)     Status: Abnormal   Collection Time: 01/28/15 10:42 PM  Result Value Ref Range   Heparin Unfractionated >2.20 (H) 0.30 - 0.70 IU/mL  Magnesium     Status: Abnormal   Collection Time: 01/28/15 10:42 PM  Result Value Ref Range   Magnesium 2.5 (H) 1.7 - 2.4 mg/dL  Protime-INR     Status: Abnormal   Collection Time: 01/28/15 10:42 PM  Result Value Ref Range   Prothrombin Time 17.5 (H) 11.6 - 15.2 seconds   INR 1.42 0.00 - 1.49  Magnesium     Status: None   Collection Time: 01/29/15  5:06 AM  Result Value Ref Range   Magnesium 2.3 1.7 - 2.4 mg/dL  Heparin level (unfractionated)     Status: Abnormal   Collection Time: 01/29/15  5:06 AM  Result Value Ref Range   Heparin Unfractionated 1.82 (H) 0.30 - 0.70 IU/mL  CBC     Status: Abnormal   Collection Time: 01/29/15  5:06 AM  Result Value Ref Range   WBC 4.6 4.0 - 10.5 K/uL   RBC 3.67 (L) 4.22 -  5.81 MIL/uL   Hemoglobin 9.8 (L) 13.0 - 17.0 g/dL   HCT 40.9 (L) 81.1 - 91.4 %   MCV 88.0 78.0 - 100.0 fL   MCH 26.7 26.0 - 34.0 pg   MCHC 30.3 30.0 - 36.0 g/dL   RDW 78.2 (H) 95.6 - 21.3 %   Platelets 208 150 - 400 K/uL  Comprehensive metabolic panel     Status: Abnormal   Collection Time: 01/29/15  5:06 AM  Result Value Ref Range   Sodium 142 135 - 145 mmol/L   Potassium 3.5 3.5 - 5.1 mmol/L   Chloride 105 101 - 111 mmol/L   CO2 27 22 - 32 mmol/L   Glucose, Bld 95 65 - 99 mg/dL   BUN 16 6 - 20 mg/dL   Creatinine, Ser 0.86 0.61 - 1.24 mg/dL   Calcium 8.4 (L) 8.9 - 10.3 mg/dL   Total Protein 6.4 (L) 6.5 - 8.1 g/dL   Albumin 3.5 3.5 - 5.0 g/dL   AST 24 15 - 41 U/L   ALT 17 17 - 63 U/L   Alkaline Phosphatase 53 38 - 126 U/L   Total Bilirubin 0.9 0.3 - 1.2 mg/dL   GFR calc non Af Amer >60 >60 mL/min   GFR calc Af Amer >60 >60 mL/min   Anion gap 10 5 - 15  Troponin I     Status: None   Collection Time: 01/29/15  5:06 AM  Result Value Ref Range   Troponin I <0.03 <0.031 ng/mL  APTT     Status: Abnormal   Collection Time: 01/29/15  5:06 AM  Result Value Ref Range   aPTT 48 (H) 24 - 37 seconds    Signed: Selina Cooley, MD 01/29/2015, 3:30 PM

## 2015-02-08 DIAGNOSIS — I2510 Atherosclerotic heart disease of native coronary artery without angina pectoris: Secondary | ICD-10-CM | POA: Diagnosis not present

## 2015-02-08 DIAGNOSIS — M859 Disorder of bone density and structure, unspecified: Secondary | ICD-10-CM | POA: Diagnosis not present

## 2015-02-08 DIAGNOSIS — Z13820 Encounter for screening for osteoporosis: Secondary | ICD-10-CM | POA: Diagnosis not present

## 2015-02-08 DIAGNOSIS — M898X9 Other specified disorders of bone, unspecified site: Secondary | ICD-10-CM | POA: Diagnosis not present

## 2015-02-08 DIAGNOSIS — Z954 Presence of other heart-valve replacement: Secondary | ICD-10-CM | POA: Diagnosis not present

## 2015-02-08 DIAGNOSIS — I4891 Unspecified atrial fibrillation: Secondary | ICD-10-CM | POA: Diagnosis not present

## 2015-02-08 DIAGNOSIS — I4892 Unspecified atrial flutter: Secondary | ICD-10-CM | POA: Diagnosis not present

## 2015-02-24 ENCOUNTER — Encounter: Payer: Self-pay | Admitting: *Deleted

## 2015-02-26 ENCOUNTER — Ambulatory Visit (INDEPENDENT_AMBULATORY_CARE_PROVIDER_SITE_OTHER): Payer: Medicare Other | Admitting: Interventional Cardiology

## 2015-02-26 ENCOUNTER — Encounter: Payer: Self-pay | Admitting: Interventional Cardiology

## 2015-02-26 VITALS — BP 136/56 | HR 49 | Ht 71.0 in | Wt 189.0 lb

## 2015-02-26 DIAGNOSIS — Z952 Presence of prosthetic heart valve: Secondary | ICD-10-CM

## 2015-02-26 DIAGNOSIS — E785 Hyperlipidemia, unspecified: Secondary | ICD-10-CM | POA: Diagnosis not present

## 2015-02-26 DIAGNOSIS — I480 Paroxysmal atrial fibrillation: Secondary | ICD-10-CM

## 2015-02-26 DIAGNOSIS — I2510 Atherosclerotic heart disease of native coronary artery without angina pectoris: Secondary | ICD-10-CM | POA: Diagnosis not present

## 2015-02-26 DIAGNOSIS — I10 Essential (primary) hypertension: Secondary | ICD-10-CM

## 2015-02-26 DIAGNOSIS — Z954 Presence of other heart-valve replacement: Secondary | ICD-10-CM

## 2015-02-26 DIAGNOSIS — I7100 Dissection of unspecified site of aorta: Secondary | ICD-10-CM

## 2015-02-26 MED ORDER — METOPROLOL TARTRATE 25 MG PO TABS: ORAL_TABLET | ORAL | Status: DC

## 2015-02-26 MED ORDER — APIXABAN 5 MG PO TABS: 5.0000 mg | ORAL_TABLET | Freq: Two times a day (BID) | ORAL | Status: DC

## 2015-02-26 NOTE — Patient Instructions (Signed)
Medication Instructions:  1) Take Metoprolol Tartrate 12.5mg  at night  Labwork: None  Testing/Procedures: None  Follow-Up: Your physician wants you to follow-up in: 9 months with [name]. You will receive a reminder letter in the mail two months in advance. If you don't receive a letter, please call our office to schedule the follow-up appointment.   Any Other Special Instructions Will Be Listed Below (If Applicable).

## 2015-02-26 NOTE — Progress Notes (Signed)
Patient ID: [name] [name], male   DOB: [birth year], 74 y.o.   MRN: [id number]     Cardiology Office Note   Date:  02/26/2015   ID:  [name] [name], DOB 1940-12-06, MRN [id number]  PCP:  [name] Pearson, MD    No chief complaint on file.    Wt Readings from Last 3 Encounters:  02/26/15 189 lb (85.73 kg)  01/29/15 188 lb 4.4 oz (85.4 kg)  10/15/14 185 lb (83.915 kg)       History of Present Illness: [name] [name] is a 74 y.o. male  with h/o aortic disection, CAD - DES to circumflex in 2011. 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 He was admitted to the hospital on 10/05/13 for urosepsis and was on the ventilator for 2 days. Also has thoracentesis for pleural effusion (1200cc).   A few weeks ago, he had been out on a hot day. He then went to get his haircut. He had a syncopal episode while in the chair. He did have some warning where he felt lightheaded. He was admitted. 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 He has gone back to walking regularly. Overall, he feels that his stamina is the same.    Past Medical History  Diagnosis Date  . Hyperlipidemia   . DVT (deep venous thrombosis)     ?LLE post knee surgery  . Tobacco abuse   . HTN (hypertension)   . Descending aortic aneurysm   . Aortic valve insufficiency   . Thoracic aneurysm   . CAD (coronary artery disease)   . Atrial flutter   . CHF (congestive heart failure)   . History of blood transfusion     "after one of my knee surgeries"  . Arthritis     "probably in my knees before they replaced them" (01/28/2015)    Past Surgical History  Procedure Laterality Date  . Right groin lymphocele Right 03/29/2009  . Aortic valve replacement  02/19/2009  . Replacement total knee Bilateral   . Carotid-subclavian bypass graft Left 08/26/2013    Procedure: BYPASS GRAFT CAROTID-SUBCLAVIAN;  Surgeon: Nada Libman, MD;  Location: [facility] OR;  Service:  Vascular;  Laterality: Left;  . Endovascular stent insertion N/A 08/26/2013    Procedure:  THORACIC STENT GRAFT INSERTION;  Surgeon: Nada Libman, MD;  Location: [facility] OR;  Service: Vascular;  Laterality: N/A;  . Cardiac valve replacement    . Tonsillectomy    . Knee arthroscopy Bilateral   . Joint replacement    . Thoracentesis  2010 X 2  . Aorto-femoral bypass graft  03/2010    ascending aortic and arch aneurysm repair/notes 04/09/2009  . Cataract extraction w/ intraocular lens  implant, bilateral Bilateral      Current Outpatient Prescriptions  Medication Sig Dispense Refill  . amiodarone (PACERONE) 200 MG tablet Take 1 tablet (200 mg total) by mouth daily. 30 tablet 11  . apixaban (ELIQUIS) 5 MG TABS tablet Take 1 tablet (5 mg total) by mouth 2 (two) times daily. 60 tablet 3  . atorvastatin (LIPITOR) 10 MG tablet Take 1 tablet (10 mg total) by mouth daily. 30 tablet 11  . furosemide (LASIX) 40 MG tablet Take 1 tablet (40 mg total) by mouth 2 (two) times daily. 60 tablet 11  . loratadine (CLARITIN) 10 MG tablet Take 1 tablet (10 mg total) by mouth daily. 30 tablet 11  . metoprolol tartrate (LOPRESSOR) 25 MG  tablet Take half a tablet (12.5 mg) twice daily 30 tablet 6  . Multiple Vitamin (MULTIVITAMIN WITH MINERALS) TABS tablet Take 1 tablet by mouth daily.    . nitroGLYCERIN (NITROSTAT) 0.4 MG SL tablet Place 1 tablet (0.4 mg total) under the tongue every 5 (five) minutes as needed. 25 tablet 6  . potassium chloride SA (K-DUR,KLOR-CON) 20 MEQ tablet Take 2 tablets (40 mEq total) by mouth daily. 60 tablet 11  . tamsulosin (FLOMAX) 0.4 MG CAPS capsule Take 0.4 mg by mouth 2 (two) times daily.      No current facility-administered medications for this visit.    Allergies:   Lortab and Morphine and related    Social History:  The patient  reports that he quit smoking about 40 years ago. His smoking use included Cigarettes. He smoked 0.00 packs per day. He has never used smokeless  tobacco. He reports that he does not drink alcohol or use illicit drugs.   Family History:  The patient's family history includes Aortic aneurysm in his father; Celiac disease in his daughter.    ROS:  Please see the history of present illness.   Otherwise, review of systems are positive for recent syncope.   All other systems are reviewed and negative.    PHYSICAL EXAM: VS:  BP 136/56 mmHg  Pulse 49  Ht  (1.803 m)  Wt 189 lb (85.73 kg)  BMI 26.37 kg/m2 , BMI Body mass index is 26.37 kg/(m^2). GEN: Well nourished, well developed, in no acute distress HEENT: normal Neck: no JVD, carotid bruits, or masses Cardiac: RRR; 2/6 systolic murmurs, rubs, or gallops,no edema  Respiratory:  clear to auscultation bilaterally, normal work of breathing GI: soft, nontender, nondistended, + BS MS: no deformity or atrophy Skin: warm and dry, no rash Neuro:  Strength and sensation are intact Psych: euthymic mood, full affect   EKG:   The ekg ordered today demonstrates sinus bradycardia, nonspecific ST segment changes   Recent Labs: 01/28/2015: B Natriuretic Peptide 278.9* 01/29/2015: ALT 17; BUN 16; Creatinine, Ser 0.97; Hemoglobin 9.8*; Magnesium 2.3; Platelets 208; Potassium 3.5; Sodium 142   Lipid Panel    Component Value Date/Time   CHOL 152 10/10/2013 1220   TRIG 116 10/07/2013 0320   HDL 38* 07/29/2010 0210   CHOLHDL 3.6 07/29/2010 0210   VLDL 18 07/29/2010 0210   LDLCALC  07/29/2010 0210    80        Total Cholesterol/HDL:CHD Risk Coronary Heart Disease Risk Table                     Men   Women  1/2 Average Risk   3.4   3.3  Average Risk       5.0   4.4  2 X Average Risk   9.6   7.1  3 X Average Risk  23.4   11.0        Use the calculated Patient Ratio above and the CHD Risk Table to determine the patient's CHD Risk.        ATP III CLASSIFICATION (LDL):  <100     mg/dL   Optimal  914-782  mg/dL   Near or Above                    Optimal  130-159  mg/dL    Borderline  956-213  mg/dL   High  >086     mg/dL   Very High  Other studies Reviewed: Additional studies/ records that were reviewed today with results demonstrating: 2016 nuclear stress test reviewed.   ASSESSMENT AND PLAN:  1. History of aortic dissection: Blood pressure stable.  HTN controlled. 2. Syncope/bradycardia. We'll decrease metoprolol to 12.5 mg daily at bedtime only. Depending on how he does, may need to change this back to twice a day. We'll see if any atrial fibrillation symptoms come back. Hopefully, his amiodarone will keep him in sinus rhythm. Continue anticoagulation with Eliquis. 3. Mitral regurgitation: Mild. No CHF symptoms. 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 Lipids well controlled.   Current medicines are reviewed at length with the patient today.  The patient concerns regarding his medicines were addressed.  The following changes have been made:  Decrease frequency of metoprolol  Labs/ tests ordered today include:  No orders of the defined types were placed in this encounter.    Recommend 150 minutes/week of aerobic exercise Low fat, low carb, high fiber diet recommended  Disposition:   FU in 9 months   Delorise Jackson., MD  02/26/2015 11:07 AM    [facility] [address] [place], [place], [place]  [address] Phone: [phone]; Fax: [phone]

## 2015-03-01 ENCOUNTER — Encounter: Payer: Self-pay | Admitting: Interventional Cardiology

## 2015-03-18 DIAGNOSIS — E785 Hyperlipidemia, unspecified: Secondary | ICD-10-CM | POA: Diagnosis not present

## 2015-03-19 ENCOUNTER — Encounter: Payer: Self-pay | Admitting: Interventional Cardiology

## 2015-04-12 DIAGNOSIS — Z23 Encounter for immunization: Secondary | ICD-10-CM | POA: Diagnosis not present

## 2015-04-15 DIAGNOSIS — N400 Enlarged prostate without lower urinary tract symptoms: Secondary | ICD-10-CM | POA: Diagnosis not present

## 2015-05-25 ENCOUNTER — Other Ambulatory Visit: Payer: Self-pay | Admitting: Interventional Cardiology

## 2015-05-26 ENCOUNTER — Other Ambulatory Visit: Payer: Self-pay | Admitting: Interventional Cardiology

## 2015-05-27 ENCOUNTER — Other Ambulatory Visit: Payer: Self-pay | Admitting: Interventional Cardiology

## 2015-06-14 ENCOUNTER — Other Ambulatory Visit: Payer: Self-pay | Admitting: Interventional Cardiology

## 2015-06-14 DIAGNOSIS — R05 Cough: Secondary | ICD-10-CM | POA: Diagnosis not present

## 2015-06-14 DIAGNOSIS — J069 Acute upper respiratory infection, unspecified: Secondary | ICD-10-CM | POA: Diagnosis not present

## 2015-06-15 ENCOUNTER — Telehealth: Payer: Self-pay | Admitting: Interventional Cardiology

## 2015-06-15 ENCOUNTER — Other Ambulatory Visit: Payer: Self-pay | Admitting: *Deleted

## 2015-06-15 MED ORDER — FUROSEMIDE 40 MG PO TABS: 40.0000 mg | ORAL_TABLET | Freq: Two times a day (BID) | ORAL | Status: DC

## 2015-06-15 NOTE — Telephone Encounter (Signed)
°*  STAT* If patient is at the pharmacy, call can be transferred to refill team.   1. Which medications need to be refilled? (please list name of each medication and dose if known) Furosemide 40mg   2. Which pharmacy/location (including street and city if local pharmacy) is medication to be sent to?[facility] [place]  3. Do they need a 30 day or 90 day supply? ?

## 2015-08-05 ENCOUNTER — Encounter: Payer: Self-pay | Admitting: Cardiology

## 2015-08-08 ENCOUNTER — Other Ambulatory Visit: Payer: Self-pay | Admitting: Interventional Cardiology

## 2015-08-10 ENCOUNTER — Telehealth: Payer: Self-pay | Admitting: Interventional Cardiology

## 2015-08-10 NOTE — Telephone Encounter (Signed)
*  STAT* If patient is at the pharmacy, call can be transferred to refill team.  Pt has appt w/ Dr Seth Bake 11/29/15.   1. Which medications need to be refilled? (please list name of each medication and dose if known) Loratidine 10mg   2. Which pharmacy/location (including street and city if local pharmacy) is medication to be sent to?[facility] [place]  3. Do they need a 30 day or 90 day supply? 30

## 2015-08-11 NOTE — Telephone Encounter (Signed)
The pt is advised that we have sent this RX refill request back to his pharmacy with a message to refer to his PCP to fill as Dr Eldridge Dace is his cardiologist and may have refilled medication in the past as a courtesy to the the pt. He verbalized understanding.

## 2015-08-11 NOTE — Telephone Encounter (Signed)
Please refer to he pts PCP. Thanks.

## 2015-09-09 ENCOUNTER — Encounter: Payer: Self-pay | Admitting: Interventional Cardiology

## 2015-09-09 DIAGNOSIS — D649 Anemia, unspecified: Secondary | ICD-10-CM | POA: Diagnosis not present

## 2015-09-09 DIAGNOSIS — I4892 Unspecified atrial flutter: Secondary | ICD-10-CM | POA: Diagnosis not present

## 2015-09-09 DIAGNOSIS — Z0000 Encounter for general adult medical examination without abnormal findings: Secondary | ICD-10-CM | POA: Diagnosis not present

## 2015-09-09 DIAGNOSIS — I714 Abdominal aortic aneurysm, without rupture: Secondary | ICD-10-CM | POA: Diagnosis not present

## 2015-09-09 DIAGNOSIS — I4891 Unspecified atrial fibrillation: Secondary | ICD-10-CM | POA: Diagnosis not present

## 2015-09-09 DIAGNOSIS — I2510 Atherosclerotic heart disease of native coronary artery without angina pectoris: Secondary | ICD-10-CM | POA: Diagnosis not present

## 2015-09-10 ENCOUNTER — Other Ambulatory Visit: Payer: Self-pay | Admitting: Cardiothoracic Surgery

## 2015-09-10 DIAGNOSIS — I253 Aneurysm of heart: Secondary | ICD-10-CM

## 2015-09-23 ENCOUNTER — Encounter: Payer: Self-pay | Admitting: Cardiothoracic Surgery

## 2015-09-23 ENCOUNTER — Other Ambulatory Visit: Payer: Medicare Other

## 2015-09-23 ENCOUNTER — Ambulatory Visit
Admission: RE | Admit: 2015-09-23 | Discharge: 2015-09-23 | Disposition: A | Discharge status: Home / Self Care | Payer: Medicare Other | Source: Ambulatory Visit | Attending: Cardiothoracic Surgery | Admitting: Cardiothoracic Surgery

## 2015-09-23 ENCOUNTER — Ambulatory Visit (INDEPENDENT_AMBULATORY_CARE_PROVIDER_SITE_OTHER): Payer: Medicare Other | Admitting: Cardiothoracic Surgery

## 2015-09-23 VITALS — BP 113/56 | HR 69 | Resp 20 | Ht 71.0 in | Wt 197.0 lb

## 2015-09-23 DIAGNOSIS — I7101 Dissection of thoracic aorta: Secondary | ICD-10-CM

## 2015-09-23 DIAGNOSIS — I712 Thoracic aortic aneurysm, without rupture: Secondary | ICD-10-CM | POA: Diagnosis not present

## 2015-09-23 DIAGNOSIS — I71019 Dissection of thoracic aorta, unspecified: Secondary | ICD-10-CM

## 2015-09-23 DIAGNOSIS — I253 Aneurysm of heart: Secondary | ICD-10-CM

## 2015-09-23 MED ORDER — IOPAMIDOL (ISOVUE-370) INJECTION 76%
80.0000 mL | Freq: Once | INTRAVENOUS | Status: AC | PRN
  Administered 2015-09-23: 80 mL via INTRAVENOUS

## 2015-09-23 NOTE — Progress Notes (Signed)
Patient ID: [name] [name], male   DOB: [birth year], 75 y.o.   MRN: [id number]          [facility] Record #[id number] Date of Birth: [birth year]  Referring: [name], MD Primary Care: [name], MD  Chief Complaint:    Chief Complaint  Patient presents with  . 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  SURGICAL PROCEDURE: Left subclavian to carotid bypass with 8 mm graft  and ligation of proximal left subclavian artery. Endovascular repair of  descending thoracic aorta with coverage of the left subclavian and  distal extension with a proximal 40 x 40 x 20 GORE-TEX device with a  second extension 45 x 45 x 15 cm and aortogram x3.  2010- aortic valve replacement replacement of aortic root and ascending aorta and replacement of aortic arch to the left subclavian artery.  History of Present Illness:     Patient is a 75 year old male who on 02/19/2009 presented In respiratory distress on a ventilator with a large ascending aneurysm and acute aortic insufficiency and aortic arch aneurysm and at that time underwent aortic valve replacement replacement of aortic root and ascending aorta and replacement of aortic arch to the left subclavian artery. Since that time he's had an acute myocardial infarction and had a stent placed. In November 2014 prior to his yearly followup he [facility] with an acute type III aortic dissection. He was treated medically, there is hospital stay he did develop persistent atrial fibrillation and ultimately was discharged home on anticoagulation.   Prior to his followup  scan for the type III aortic dissection he was really admitted with signs and symptoms of congestive heart failure followup scan showed significant enlargement of the type III aortic dissection. August 28 2013  he underwent left subclavian to carotid bypass ligation of the proximal left subclavian artery an endovascular repair of descending thoracic aorta with coverage of the left subclavian artery. \  Since seen last year he is medical history is been relatively stable he did have a syncopal episode in the fall of 2016 evaluated by cardiology.  Current Activity/ Functional Status: Mobility/Ambulation: Independent with mobility prior to admission in the home on all surfaces with no assistive devices for unlimited distances.   Zubrod Score: At the time of surgery this patient's most appropriate activity status/level should be described as: []  Normal activity, no symptoms []  Symptoms, fully ambulatory [x]  Symptoms, in bed less than or equal to 50% of the time []  Symptoms, in bed greater than 50% of the time but less than 100% []  Bedridden []  Moribund   Past Medical History  Diagnosis Date  . Hyperlipidemia   . DVT (deep venous thrombosis) (HCC)     ?LLE post knee surgery  . Tobacco abuse   . HTN (hypertension)   . Descending aortic aneurysm (HCC)   . Aortic valve insufficiency   . Thoracic aneurysm   . CAD (coronary artery disease)   . Atrial flutter (HCC)   . CHF (congestive heart failure) (HCC)   . History of blood transfusion     "after one of my knee surgeries"  . Arthritis     "  probably in my knees before they replaced them" (01/28/2015)    Past Surgical History  Procedure Laterality Date  . Right groin lymphocele Right 03/29/2009  . Aortic valve replacement  02/19/2009  . Replacement total knee Bilateral   . Carotid-subclavian bypass graft Left 08/26/2013    Procedure: BYPASS GRAFT CAROTID-SUBCLAVIAN;  Surgeon: Nada Libman, MD;  Location: [facility] OR;  Service: Vascular;   Laterality: Left;  . Endovascular stent insertion N/A 08/26/2013    Procedure:  THORACIC STENT GRAFT INSERTION;  Surgeon: Nada Libman, MD;  Location: [facility] OR;  Service: Vascular;  Laterality: N/A;  . Cardiac valve replacement    . Tonsillectomy    . Knee arthroscopy Bilateral   . Joint replacement    . Thoracentesis  2010 X 2  . Aorto-femoral bypass graft  03/2010    ascending aortic and arch aneurysm repair/notes 04/09/2009  . Cataract extraction w/ intraocular lens  implant, bilateral Bilateral     History  Smoking status  . Former Smoker -- 0.00 packs/day  . Types: Cigarettes  . Quit date: 04/16/1974  Smokeless tobacco  . Never Used    History  Alcohol Use No      Allergies  Allergen Reactions  . Lortab [Hydrocodone-Acetaminophen] Hives  . Morphine And Related Hives    Current Outpatient Prescriptions  Medication Sig Dispense Refill  . amiodarone (PACERONE) 200 MG tablet TAKE ONE TABLET BY MOUTH ONCE DAILY 30 tablet 9  . apixaban (ELIQUIS) 5 MG TABS tablet Take 1 tablet (5 mg total) by mouth 2 (two) times daily. 60 tablet 8  . atorvastatin (LIPITOR) 10 MG tablet TAKE ONE TABLET BY MOUTH ONCE DAILY 30 tablet 9  . Ferrous Sulfate (IRON) 325 (65 Fe) MG TABS Take by mouth daily.    . furosemide (LASIX) 40 MG tablet Take 1 tablet (40 mg total) by mouth 2 (two) times daily. 60 tablet 11  . KLOR-CON M20 20 MEQ tablet TAKE TWO TABLETS BY MOUTH ONCE DAILY 60 tablet 6  . loratadine (CLARITIN) 10 MG tablet Take 1 tablet (10 mg total) by mouth daily. 30 tablet 11  . metoprolol tartrate (LOPRESSOR) 25 MG tablet Use as directed (Patient taking differently: Take 12.5 mg by mouth at bedtime. Use as directed) 30 tablet 8  . Multiple Vitamin (MULTIVITAMIN WITH MINERALS) TABS tablet Take 1 tablet by mouth daily.    . nitroGLYCERIN (NITROSTAT) 0.4 MG SL tablet Place 1 tablet (0.4 mg total) under the tongue every 5 (five) minutes as needed for chest pain. 25 tablet 6  . tamsulosin  (FLOMAX) 0.4 MG CAPS capsule Take 0.4 mg by mouth 2 (two) times daily.      No current facility-administered medications for this visit.      Physical Exam: BP 113/56 mmHg  Pulse 69  Resp 20  Ht  (1.803 m)  Wt 197 lb (89.359 kg)  BMI 27.49 kg/m2  SpO2 98%  General appearance: alert, cooperative, appears older than stated age, fatigued, no distress and slowed mentation Neurologic: intact Heart: regular rate and rhythm, S1, S2 normal, no murmur, click, rub or gallop, normal apical impulse, no click and no rub, there is a bruit over the left supraclavicular area related to his carotid subclavian bypass Lungs: clear to auscultation bilaterally Abdomen: soft, non-tender; bowel sounds normal; no masses,  no organomegaly Extremities: extremities normal, atraumatic, no cyanosis or edema, mild  edema, redness or tenderness in the calves or thighs and no ulcers, gangrene or trophic changes Wound: The  left neck incision is well-healed, has a strong probable left brachial and radial pulses    Diagnostic Studies & Laboratory data:     Recent Radiology Findings:  Ct Angio Chest Aorta W/cm &/or Wo/cm  09/23/2015  CLINICAL DATA:  F/u s/p stent for TAA No injury No hx cancer Ex smoker x 30 years EXAM: CT ANGIOGRAPHY CHEST WITH CONTRAST TECHNIQUE: Multidetector CT imaging of the chest was performed using the standard protocol during bolus administration of intravenous contrast. Multiplanar CT image reconstructions and MIPs were obtained to evaluate the vascular anatomy. CONTRAST:  80 mL Isovue 370 IV COMPARISON:  01/28/2015 FINDINGS: Vascular: Right arm IV contrast injection. SVC patent. Four-chamber cardiac enlargement. Mildly dilated central pulmonary arteries. Satisfactory opacification of pulmonary arteries noted, and there is no evidence of pulmonary emboli. Patent bilateral pulmonary veins drain into the left atrium. Scattered coronary calcifications. Previous AVR. Previous tube graft repair of  the ascending aorta with graft to great vessels. There is thrombosis of the branch to the left subclavian artery. Patent left carotid to subclavian arterial bypass graft. Stent graft extends through the aortic arch to the distal descending thoracic aorta. Proximal and distal tines appear well apposed. No endoleak. No dissection. Maximum aortic diameter 5.2 cm in the mid descending, previously 5.4 cm at the same level on previous examination by my measurement. The native distal descending thoracic aorta is dilated up to 3.9 cm diameter with moderate calcified plaque in the wall. The proximal abdominal aorta tapers to a diameter 3 cm at the level of the SMA, not visualized distally. Mediastinum/Lymph Nodes: No masses or pathologically enlarged lymph nodes identified. No pericardial effusion. Lungs/Pleura: 6 mm subpleural nodule in the anterior segment right upper lobe, previously 7.5 mm. Lungs otherwise clear. No pleural effusion. No pneumothorax. Mild emphysematous changes in the lung apices. Upper abdomen: At least 2 subcentimeter partially calcified stones in the dependent aspect of the gallbladder, nondilated. Musculoskeletal: Minimal spurring in the mid and lower thoracic spine. Previous median sternotomy. Review of the MIP images confirms the above findings. IMPRESSION: 1. 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 2. Stable postop changes of AVR, ascending aortic repair, graft to brachiocephalic vessels, and left carotid subclavian bypass. 3. Cholelithiasis. Electronically Signed   By: Corlis Leak M.D.   On: 09/23/2015 12:30   ICt Angio Chest Aorta W/cm &/or Wo/cm  10/15/2014   CLINICAL DATA:  Thoracic aortic stent graft  EXAM: CT ANGIOGRAPHY CHEST WITH CONTRAST  TECHNIQUE: Multidetector CT imaging of the chest was performed using the standard protocol during bolus  administration of intravenous contrast. Multiplanar CT image reconstructions and MIPs were obtained to evaluate the vascular anatomy.  CONTRAST:  75 cc Isovue 370  COMPARISON:  02/12/2014  FINDINGS: Chest:  Thoracic aortic stent graft is stable in position. It begins just distal to the takeoff of the innominate artery with a bovine configuration. There is no evidence of endoleak. Maximal aneurysm sac diameter on image 43 of series 5 measures 5.3 cm. This is improved compared to my direct measurements on the prior study. At that time, I measure a diameter of 5.6 cm. There is calcification between the innominate artery and aortic arch on image 31 of series 5.  There is persistent mild narrowing at the origin of the innominate artery just above the takeoff of the left common carotid artery. 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 This is unchanged. Right subclavian and common carotid arteries are patent. Origin of the right vertebral artery fills with contrast but then is non-opacified. Left common carotid artery is patent. Proximal left subclavian artery has been ligated. Left carotid to left subclavian bypass graft is patent. Left vertebral artery is dominant and patent. Left subclavian artery is patent.  Aortic valve replacement is visualized. The aortic root and ascending aorta are have a normal appearance without evidence of dissection. Maximal diameters 3.3 cm.  No obvious acute pulmonary thromboembolism.  Stable 5 mm right upper lobe pulmonary nodule on image 57 of series 602. Minimal dependent atelectasis  No pneumothorax or pleural effusion.  Cholelithiasis  Review of the MIP images confirms the above findings.  IMPRESSION: Stable position of the thoracic aortic stent graft. There is no evidence of endoleak and aneurysm sac diameter has diminished from 5.6 cm to 5.3 cm.  There is poor filling of the right vertebral artery. Early subclavian steal syndrome cannot be excluded in this  patient suggesting significant narrowing of the right subclavian artery circuit. This could conceivably occur at its origin at the level of the stent graft. Carotid Doppler study with evaluation of vertebral artery flow was recommended. There is no obvious area of narrowing on this study.   Electronically Signed   By: Jolaine Click M.D.   On: 10/15/2014 14:39    Ct Angio Chest Aorta W/cm &/or Wo/cm  02/12/2014   CLINICAL DATA:  Evaluate aortic dissection. Followup thoracic aortic aneurysm. Previous aortic valve replacement with thoracic aortic aneurysm stent graft.  EXAM: CT ANGIOGRAPHY CHEST AND ABDOMEN  TECHNIQUE: Multidetector CT imaging of the chest and abdomen was performed using the standard protocol during bolus administration of intravenous contrast. Multiplanar CT image reconstructions and MIPs were obtained to evaluate the vascular anatomy.  CONTRAST:  80mL OMNIPAQUE IOHEXOL 350 MG/ML SOLN  COMPARISON:  10/06/2013, 08/22/2013 as well as 05/18/2011  FINDINGS: CTA CHEST FINDINGS  Sternotomy wires are present. The lungs are adequately inflated without consolidation or effusion. There is very minimal apical emphysematous disease. There is a 7-8 mm nodule over the anterior right upper lobe stable since 2012. There is moderate stable cardiomegaly. No change and coronary artery calcifications with suggestion of stent over the lateral circumflex artery. Evidence of previous aortic valve replacement. No pericardial or mediastinal fluid collection. No mediastinal or hilar adenopathy.  The posterior arch/descending thoracic aortic stent graft is unchanged which excludes the previously noted aneurysm/dissection. No current evidence of a dissection. Significantly less the low density thrombus in the native aneurysm sac. No evidence of endoleak. 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  Review of the MIP images confirms the  above findings.  CTA ABDOMEN FINDINGS  Abdominal images demonstrate minimal cholelithiasis. The liver, spleen, pancreas and adrenal glands are within normal. Kidneys are normal in size without hydronephrosis or nephrolithiasis. No change in a 3.4 cm cyst over the upper pole right kidney. Few other smaller subcentimeter renal cortical hypodensities likely cysts but too small to characterize. There is diverticulosis of the colon. Appendix is within normal. Visualize ureters are within normal.  There is mild calcified plaque over the abdominal aorta. There is no evidence of abdominal aortic dissection. There is mild aneurysmal dilatation of the infrarenal abdominal aorta measuring 3.0 cm in AP diameter just below the take-off of the renal arteries. The celiac axis, superior and inferior mesenteric arteries  are patent. Single bilateral renal arteries are patent with mild focal narrowing at the origin of the right renal artery. There are degenerative changes of the spine.  Review of the MIP images confirms the above findings.  IMPRESSION: Stable aortic stent graft over the aortic arch and descending thoracic aorta with significant reduction in the mural thrombus of the native aneurysm sac. No evidence of dissection or endoleak.  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 No evidence of abdominal aortic dissection.  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  Stable moderate cardiomegaly with atherosclerotic coronary artery disease and left lateral circumflex stent. Previous median sternotomy and aortic valve replacement.  Mild cholelithiasis.  Bilateral stable renal cysts with the largest measuring 3.4 cm over the upper pole right kidney.  Diverticulosis of the colon.   Electronically Signed   By: Elberta Fortis M.D.   On: 02/12/2014 10:02    Ct Angio Head W/cm &/or Wo Cm  08/22/2013   CLINICAL DATA:  75 year old male with history of  thoracic aneurysm status post repair. Progressive weight gain, swelling and shortness of Breath. Recently thought to have sinus infection, treated with antibiotics and steroids. Symptoms increasing. Initial encounter.  EXAM: CT ANGIOGRAPHY HEAD AND NECK  TECHNIQUE: Multidetector CT imaging of the head and neck was performed using the standard protocol during bolus administration of intravenous contrast. Multiplanar CT image reconstructions and MIPs were obtained to evaluate the vascular anatomy. Carotid stenosis measurements (when applicable) are obtained utilizing NASCET criteria, using the distal internal carotid diameter as the denominator.  CONTRAST:  50mL OMNIPAQUE IOHEXOL 350 MG/ML SOLN  COMPARISON:  Chest abdomen and pelvis CTA from the same day reported separately.  FINDINGS: CTA HEAD FINDINGS  Calvarium intact. Negative scalp soft tissues. Cerebral volume is within normal limits for age. No midline shift, ventriculomegaly, mass effect, evidence of mass lesion, intracranial hemorrhage or evidence of cortically based acute infarction. Gray-white matter differentiation is within normal limits throughout the brain. No abnormal enhancement identified.  VASCULAR FINDINGS:  Major intracranial venous structures are enhancing.  Dominant distal left vertebral artery is patent without stenosis despite some calcified plaque. Retrograde supply to the non dominant distal right vertebral artery. Right PICA and AICA are patent. Dominant left AICA. The basilar artery is patent without stenosis. SCA and PCA origins are within normal limits. Normal left posterior communicating artery, the right is diminutive or absent. Bilateral PCA branches are within normal limits.  Some cavernous sinus venous contrast artifact is present. Both ICA siphons are patent. Ophthalmic and posterior communicating arteries are within normal limits. Normal carotid termini. Normal MCA and ACA origins. Anterior communicating artery and bilateral ACA  branches are within normal limits. Bilateral MCA branches are within normal limits.  Review of the MIP images confirms the above findings.  CTA NECK FINDINGS  Chest findings are reported separately (please see that report). Negative thyroid, larynx, pharynx, parapharyngeal spaces, retropharyngeal space. Extensive dental hardware artifact. Negative visualized sublingual space, submandibular glands and submandibular space. Negative parotid glands. No acute orbits soft tissue findings.  No cervical lymphadenopathy. Mucous retention cyst left maxillary sinus. Other Visualized paranasal sinuses and mastoids are clear. No acute osseous abnormality identified. Degenerative changes in the cervical spine.  VASCULAR FINDINGS:  SEE CHEST CTA FINDINGS FROM TODAY REPORTED SEPARATELY.  Both internal jugular veins are patent. The left subclavian and innominate vein are patent. Negative non contrast appearance of the right subclavian. The right innominate vein is patent.  Great vessel origins are patent, dolichoectatic. Widespread mild soft and calcified circumferential plaque in the great vessels.  Patent, mildly dolichoectatic right CCA. Patent right carotid bifurcation with no right ICA stenosis despite calcified plaque. Tortuous, mildly dolichoectatic cervical right ICA.  No proximal right subclavian artery stenosis, with some dolichoectasia. Non dominant right vertebral artery is occluded at its origin. Distal right vertebral artery flow is reconstituted briefly at the C2-C3 level, but then re- occludes below the skullbase. Intracranial findings detailed above.  Patent, the dolichoectatic left CCA. Calcified plaque at the left carotid bifurcation which is significantly affected by dental streak artifact. Loss of enhancement in the left ICA origin and bulb felt to be artifactual (see series 12, image 172). Tortuous/dolichoectatic but otherwise negative cervical left ICA be on that level.  No proximal left subclavian artery  stenosis. Dominant left vertebral artery origin widely patent. Tortuous proximal left vertebral artery. Dominant left vertebral artery tortuous and patent to the skullbase.  Review of the MIP images confirms the above findings.  IMPRESSION: 1. Abnormal thoracic aorta, see chest CTA from today reported separately. 2. Patent, dolichoectatic great vessels without arterial dissection in the neck. 3. Occluded non dominant right vertebral artery. Patent, dominant left vertebral artery. 4. Suspect artifactual decreased enhancement of the left ICA origin, related to severe dental hardware artifact. Absence of any proximal left ICA stenosis could be confirmed with either carotid Doppler or neck MRA if necessary. 5. Distal right vertebral artery supplied in a retrograde fashion from the left. Intracranial atherosclerosis, but no intracranial stenosis and otherwise negative intracranial CTA. 6.  No acute intracranial abnormality.   Electronically Signed   By: Augusto Gamble M.D.   On: 08/22/2013 12:26       Recent Lab Findings: Lab Results  Component Value Date   WBC 4.6 01/29/2015   HGB 9.8* 01/29/2015   HCT 32.3* 01/29/2015   PLT 208 01/29/2015   GLUCOSE 95 01/29/2015   CHOL 152 10/10/2013   TRIG 116 10/07/2013   HDL 38* 07/29/2010   LDLCALC  07/29/2010    80        Total Cholesterol/HDL:CHD Risk Coronary Heart Disease Risk Table                     Men   Women  1/2 Average Risk   3.4   3.3  Average Risk       5.0   4.4  2 X Average Risk   9.6   7.1  3 X Average Risk  23.4   11.0        Use the calculated Patient Ratio above and the CHD Risk Table to determine the patient's CHD Risk.        ATP III CLASSIFICATION (LDL):  <100     mg/dL   Optimal  161-096  mg/dL   Near or Above                    Optimal  130-159  mg/dL   Borderline  045-409  mg/dL   High  >811     mg/dL   Very High   ALT 17 91/47/8295   AST 24 01/29/2015   NA 142 01/29/2015   K 3.5 01/29/2015   CL 105 01/29/2015    CREATININE 0.97 01/29/2015   BUN 16 01/29/2015   CO2 27 01/29/2015   TSH 1.102 05/08/2013   INR 1.42 01/28/2015   HGBA1C 6.1* 08/27/2013      Assessment /  Plan:   Patient status post left subclavian carotid bypass,  ligation of subclavian artery and endovascular repair of a chronic type III aortic dissection , with previous biologic Bentall and arch replacement . Stable appearance of thoracic aortic stent Repeat CTA of chest one year  Delight Ovens MD 09/23/2015 12:39 PM

## 2015-11-29 ENCOUNTER — Ambulatory Visit (INDEPENDENT_AMBULATORY_CARE_PROVIDER_SITE_OTHER): Payer: Medicare Other | Admitting: Interventional Cardiology

## 2015-11-29 ENCOUNTER — Encounter: Payer: Self-pay | Admitting: Interventional Cardiology

## 2015-11-29 VITALS — BP 145/70 | HR 56 | Ht 71.0 in | Wt 192.0 lb

## 2015-11-29 DIAGNOSIS — I7103 Dissection of thoracoabdominal aorta: Secondary | ICD-10-CM | POA: Diagnosis not present

## 2015-11-29 DIAGNOSIS — I480 Paroxysmal atrial fibrillation: Secondary | ICD-10-CM

## 2015-11-29 DIAGNOSIS — I252 Old myocardial infarction: Secondary | ICD-10-CM

## 2015-11-29 DIAGNOSIS — I2510 Atherosclerotic heart disease of native coronary artery without angina pectoris: Secondary | ICD-10-CM | POA: Diagnosis not present

## 2015-11-29 MED ORDER — FUROSEMIDE 40 MG PO TABS: 40.0000 mg | ORAL_TABLET | Freq: Two times a day (BID) | ORAL | Status: DC

## 2015-11-29 MED ORDER — ATORVASTATIN CALCIUM 10 MG PO TABS: 10.0000 mg | ORAL_TABLET | Freq: Every day | ORAL | Status: DC

## 2015-11-29 MED ORDER — METOPROLOL SUCCINATE ER 25 MG PO TB24: 12.5000 mg | ORAL_TABLET | Freq: Every day | ORAL | Status: DC

## 2015-11-29 MED ORDER — POTASSIUM CHLORIDE CRYS ER 20 MEQ PO TBCR: 40.0000 meq | EXTENDED_RELEASE_TABLET | Freq: Every day | ORAL | Status: DC

## 2015-11-29 MED ORDER — APIXABAN 5 MG PO TABS: 5.0000 mg | ORAL_TABLET | Freq: Two times a day (BID) | ORAL | Status: DC

## 2015-11-29 MED ORDER — AMIODARONE HCL 200 MG PO TABS: 200.0000 mg | ORAL_TABLET | Freq: Every day | ORAL | Status: DC

## 2015-11-29 NOTE — Patient Instructions (Signed)
**Note De-Identified  Obfuscation** Medication Instructions:  Same-no changes  Labwork: None  Testing/Procedures: None  Follow-Up: Your physician wants you to follow-up in: 9 to 10 months. You will receive a reminder letter in the mail two months in advance. If you don't receive a letter, please call our office to schedule the follow-up appointment.     If you need a refill on your cardiac medications before your next appointment, please call your pharmacy.

## 2015-11-29 NOTE — Progress Notes (Signed)
Cardiology Office Note   Date:  11/29/2015   ID:  [name] [name], DOB 06/23/1940, MRN [id number]  PCP:  [name], MD    No chief complaint on file. f/u CAD   Wt Readings from Last 3 Encounters:  11/29/15 192 lb (87.091 kg)  09/23/15 197 lb (89.359 kg)  02/26/15 189 lb (85.73 kg)       History of Present Illness: [name] [name] is a 75 y.o. male  with h/o aortic disection, CAD - DES to circumflex in 2011. 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 He was admitted to the hospital on 10/05/13 for urosepsis and was on the ventilator for 2 days. Also has thoracentesis for pleural effusion (1200cc).   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 He has gone back to walking regularly.   Overall, he is felt well. He denies any chest discomfort or shortness of breath. He walks the dog daily.    Past Medical History  Diagnosis Date  . Hyperlipidemia   . DVT (deep venous thrombosis) (HCC)     ?LLE post knee surgery  . Tobacco abuse   . HTN (hypertension)   . Descending aortic aneurysm (HCC)   . Aortic valve insufficiency   . Thoracic aneurysm   . CAD (coronary artery disease)   . Atrial flutter (HCC)   . CHF (congestive heart failure) (HCC)   . History of blood transfusion     "after one of my knee surgeries"  . Arthritis     "probably in my knees before they replaced them" (01/28/2015)    Past Surgical History  Procedure Laterality Date  . Right groin lymphocele Right 03/29/2009  . Aortic valve replacement  02/19/2009  . Replacement total knee Bilateral   . Carotid-subclavian bypass graft Left 08/26/2013    Procedure: BYPASS GRAFT CAROTID-SUBCLAVIAN;  Surgeon: Nada Libman, MD;  Location: [facility] OR;  Service: Vascular;  Laterality: Left;  . Endovascular stent insertion N/A 08/26/2013    Procedure:  THORACIC STENT GRAFT INSERTION;  Surgeon: Nada Libman, MD;  Location: [facility] OR;   Service: Vascular;  Laterality: N/A;  . Cardiac valve replacement    . Tonsillectomy    . Knee arthroscopy Bilateral   . Joint replacement    . Thoracentesis  2010 X 2  . Aorto-femoral bypass graft  03/2010    ascending aortic and arch aneurysm repair/notes 04/09/2009  . Cataract extraction w/ intraocular lens  implant, bilateral Bilateral      Current Outpatient Prescriptions  Medication Sig Dispense Refill  . amiodarone (PACERONE) 200 MG tablet TAKE ONE TABLET BY MOUTH ONCE DAILY 30 tablet 9  . apixaban (ELIQUIS) 5 MG TABS tablet Take 1 tablet (5 mg total) by mouth 2 (two) times daily. 60 tablet 8  . atorvastatin (LIPITOR) 10 MG tablet TAKE ONE TABLET BY MOUTH ONCE DAILY 30 tablet 9  . Ferrous Sulfate (IRON) 325 (65 Fe) MG TABS Take by mouth daily.    . furosemide (LASIX) 40 MG tablet Take 1 tablet (40 mg total) by mouth 2 (two) times daily. 60 tablet 11  . KLOR-CON M20 20 MEQ tablet TAKE TWO TABLETS BY MOUTH ONCE DAILY 60 tablet 6  . loratadine (CLARITIN) 10 MG tablet Take 1 tablet (10 mg total) by mouth daily. 30 tablet 11  . metoprolol succinate (TOPROL-XL) 25 MG 24 hr tablet Take 12.5 mg by mouth at bedtime.    Marland Kitchen  Multiple Vitamin (MULTIVITAMIN WITH MINERALS) TABS tablet Take 1 tablet by mouth daily.    . nitroGLYCERIN (NITROSTAT) 0.4 MG SL tablet Place 0.4 mg under the tongue every 5 (five) minutes as needed for chest pain (X3 DOSES BEFORE CALLING 911).    . tamsulosin (FLOMAX) 0.4 MG CAPS capsule Take 0.4 mg by mouth 2 (two) times daily.      No current facility-administered medications for this visit.    Allergies:   Lortab and Morphine and related    Social History:  The patient  reports that he quit smoking about 41 years ago. His smoking use included Cigarettes. He smoked 0.00 packs per day. He has never used smokeless tobacco. He reports that he does not drink alcohol or use illicit drugs.   Family History:  The patient's family history includes Aortic aneurysm in his  father; Celiac disease in his daughter; Hypertension in his mother. There is no history of Heart attack or Stroke.    ROS:  Please see the history of present illness.   Otherwise, review of systems are positive for Minimal swelling in his legs.   All other systems are reviewed and negative.    PHYSICAL EXAM: VS:  BP 145/70 mmHg  Pulse 56  Ht  (1.803 m)  Wt 192 lb (87.091 kg)  BMI 26.79 kg/m2 , BMI Body mass index is 26.79 kg/(m^2). GEN: Well nourished, well developed, in no acute distress HEENT: normal Neck: no JVD, carotid bruits, or masses Cardiac: RRR; no murmurs, rubs, or gallops, bilateral trace leg edema  Respiratory:  clear to auscultation bilaterally, normal work of breathing GI: soft, nontender, nondistended, + BS MS: no deformity or atrophy Skin: warm and dry, no rash Neuro:  Strength and sensation are intact Psych: euthymic mood, full affect     Recent Labs: 01/28/2015: B Natriuretic Peptide 278.9* 01/29/2015: ALT 17; BUN 16; Creatinine, Ser 0.97; Hemoglobin 9.8*; Magnesium 2.3; Platelets 208; Potassium 3.5; Sodium 142   Lipid Panel    Component Value Date/Time   CHOL 152 10/10/2013 1220   TRIG 116 10/07/2013 0320   HDL 38* 07/29/2010 0210   CHOLHDL 3.6 07/29/2010 0210   VLDL 18 07/29/2010 0210   LDLCALC  07/29/2010 0210    80        Total Cholesterol/HDL:CHD Risk Coronary Heart Disease Risk Table                     Men   Women  1/2 Average Risk   3.4   3.3  Average Risk       5.0   4.4  2 X Average Risk   9.6   7.1  3 X Average Risk  23.4   11.0        Use the calculated Patient Ratio above and the CHD Risk Table to determine the patient's CHD Risk.        ATP III CLASSIFICATION (LDL):  <100     mg/dL   Optimal  161-096  mg/dL   Near or Above                    Optimal  130-159  mg/dL   Borderline  045-409  mg/dL   High  >811     mg/dL   Very High     Other studies Reviewed: Additional studies/ records that were reviewed today with  results demonstrating: CT imaging showed no endoleak in 2017.   ASSESSMENT AND PLAN:  1.  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 Continue aggressive secondary prevention. 2. Aortic dissection: Followed by Dr. Tyrone Sage. 3. Mitral regurgitation: No CHF sx. 4. AFib: Eliquis for stroke prevention.  5. No further syncope since reducing beta blocker.    Current medicines are reviewed at length with the patient today.  The patient concerns regarding his medicines were addressed.  The following changes have been made:  No change  Labs/ tests ordered today include:  No orders of the defined types were placed in this encounter.    Recommend 150 minutes/week of aerobic exercise Low fat, low carb, high fiber diet recommended  Disposition:   FU in 9 months   Signed, Lance Muss, MD  11/29/2015 11:05 AM    [facility] [address] [place], [place], [place]  [address] Phone: [phone]; Fax: [phone]

## 2016-01-18 IMAGING — CR DG CHEST 1V PORT
1 series · 1 of 1 positions shown · non-contrast
Comparison: CT ANGIO CHEST W/CM &/OR WO/CM dated 08/22/2013;

CLINICAL DATA: CHF and enlarging descending thoracic aorta
secondary to prior dissection.

EXAM:
PORTABLE CHEST - 1 VIEW

[AP]
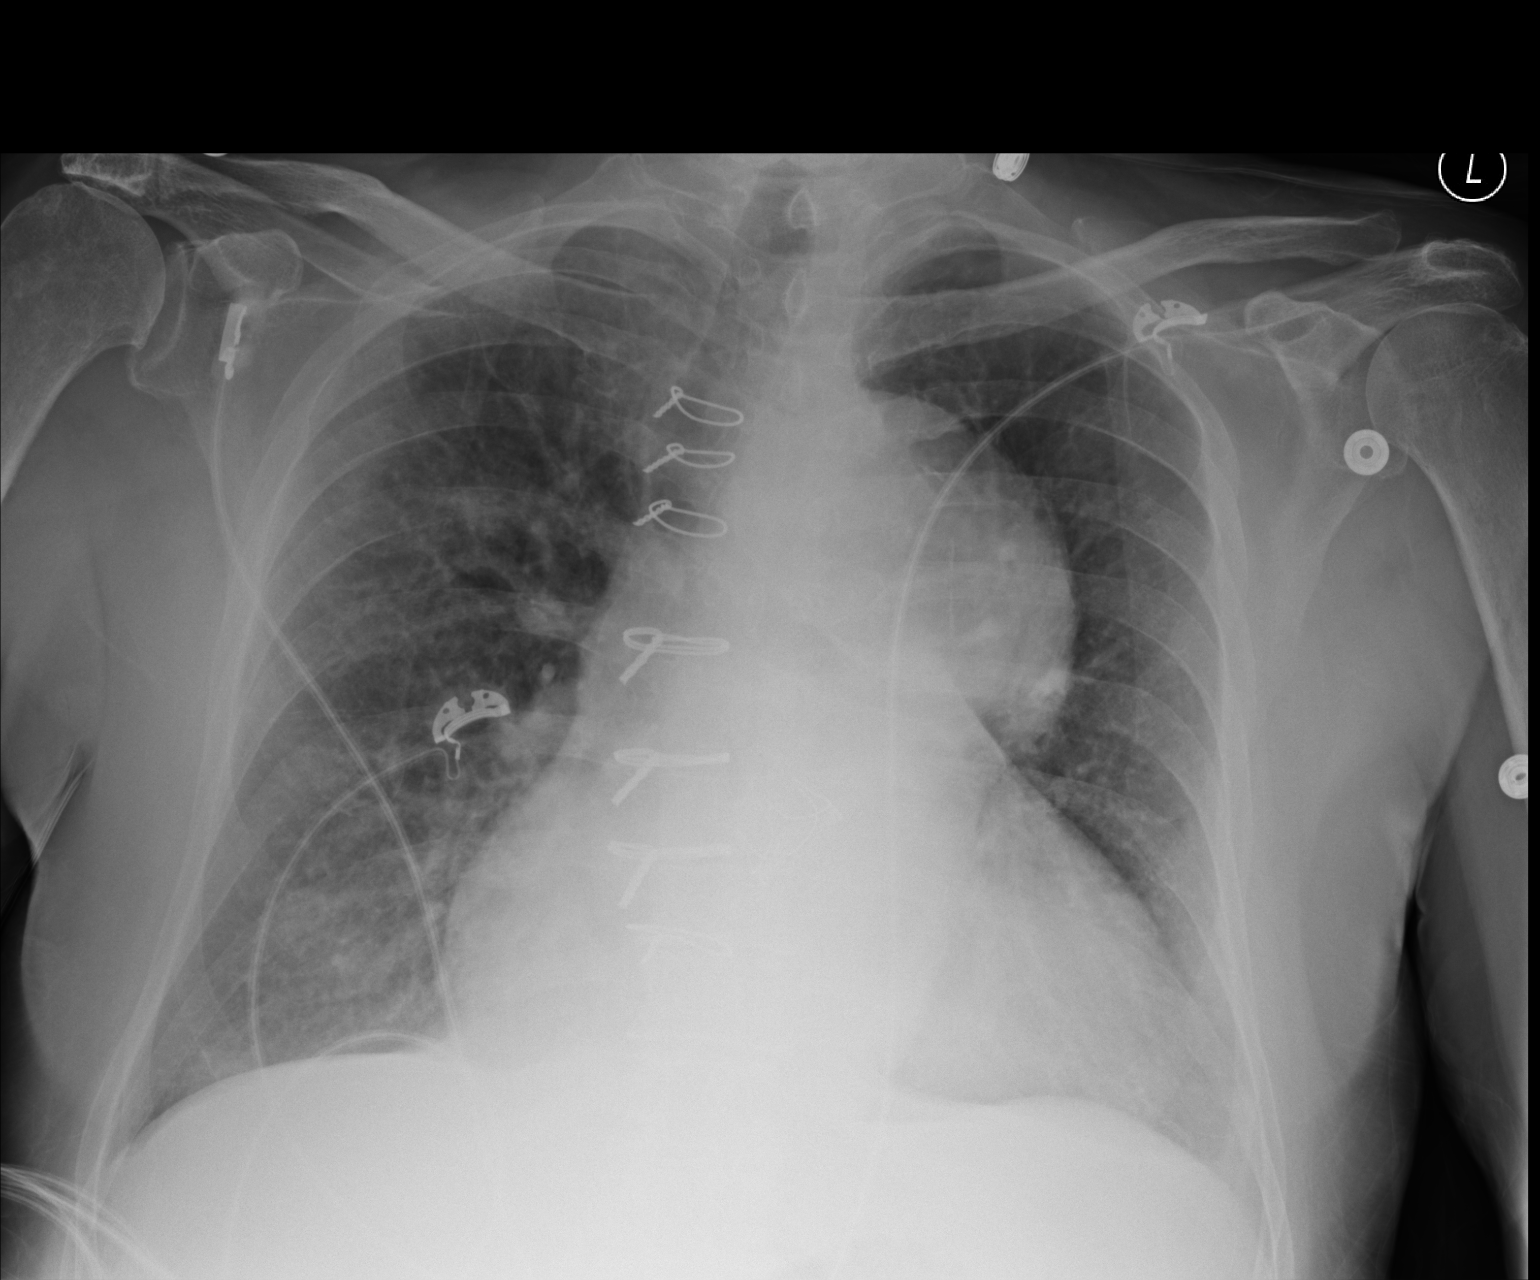

[1 of 1 positions shown; findings below may reference images not displayed]

DG
CHEST 2 VIEW dated 08/22/2013; CT ANGIO CHEST AORTA W/CM & WO/CM
dated 05/04/2013; DG CHEST 1V PORT dated 05/12/2013
FINDINGS: Stable prominence at aortic contour along the left mediastinum
corresponding to the proximal descending thoracic aorta. There is
stable cardiomegaly. CHF appears improved since the prior chest
x-ray with no significant residual edema. Pleural effusions have
resolved.
IMPRESSION: Resolution of CHF and pleural effusions. Stable mediastinal
prominence at the level of the known aneurysmal dilatation of the
proximal descending thoracic aorta.

## 2016-04-13 DIAGNOSIS — Z23 Encounter for immunization: Secondary | ICD-10-CM | POA: Diagnosis not present

## 2016-04-19 ENCOUNTER — Other Ambulatory Visit: Payer: Self-pay | Admitting: Interventional Cardiology

## 2016-05-08 DIAGNOSIS — N400 Enlarged prostate without lower urinary tract symptoms: Secondary | ICD-10-CM | POA: Diagnosis not present

## 2016-05-08 DIAGNOSIS — R972 Elevated prostate specific antigen [PSA]: Secondary | ICD-10-CM | POA: Diagnosis not present

## 2016-06-02 ENCOUNTER — Other Ambulatory Visit: Payer: Self-pay | Admitting: Interventional Cardiology

## 2016-06-02 NOTE — Telephone Encounter (Signed)
Medication Detail    Disp Refills Start End   furosemide (LASIX) 40 MG tablet 60 tablet 11 11/29/2015    Sig - Route: Take 1 tablet (40 mg total) by mouth 2 (two) times daily. - Oral   E-Prescribing Status: Receipt confirmed by pharmacy (11/29/2015 11:22 AM EDT)   Pharmacy   [id number] - [place], [place] - [address]

## 2016-06-21 DIAGNOSIS — J0100 Acute maxillary sinusitis, unspecified: Secondary | ICD-10-CM | POA: Diagnosis not present

## 2016-07-12 DIAGNOSIS — J309 Allergic rhinitis, unspecified: Secondary | ICD-10-CM | POA: Diagnosis not present

## 2016-07-12 DIAGNOSIS — R0982 Postnasal drip: Secondary | ICD-10-CM | POA: Diagnosis not present

## 2016-07-12 DIAGNOSIS — J189 Pneumonia, unspecified organism: Secondary | ICD-10-CM | POA: Diagnosis not present

## 2016-08-11 ENCOUNTER — Encounter: Payer: Self-pay | Admitting: Interventional Cardiology

## 2016-08-18 DIAGNOSIS — R972 Elevated prostate specific antigen [PSA]: Secondary | ICD-10-CM | POA: Diagnosis not present

## 2016-08-18 DIAGNOSIS — Z125 Encounter for screening for malignant neoplasm of prostate: Secondary | ICD-10-CM | POA: Diagnosis not present

## 2016-08-20 DIAGNOSIS — S6992XA Unspecified injury of left wrist, hand and finger(s), initial encounter: Secondary | ICD-10-CM | POA: Diagnosis not present

## 2016-08-20 NOTE — Progress Notes (Signed)
Cardiology Office Note   Date:  08/21/2016   ID:  DEVAUN HERNANDEZ, DOB 1940/08/14, MRN [id number]  PCP:  [name], MD    No chief complaint on file. f/u CAD   Wt Readings from Last 3 Encounters:  08/21/16 190 lb 12.8 oz (86.5 kg)  11/29/15 192 lb (87.1 kg)  09/23/15 197 lb (89.4 kg)       History of Present Illness: [name] [name] is a 76 y.o. male  with h/o aortic disection, CAD - DES to circumflex in 2011. 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 He was admitted to the hospital on 10/05/13 for urosepsis and was on the ventilator for 2 days. Also has thoracentesis for pleural effusion (1200cc).   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 He has gone back to walking regularly.   Overall, he is felt well. He denies any chest discomfort or shortness of breath. He walks the dog daily. Unfortunately, he fell yesterday and injured his left wrist.  He tripped on a blanket and fell.  He did not pass out.  He had syncope over a year ago but has not had anymore since decreasing the beta blocker.     Past Medical History:  Diagnosis Date  . Aortic valve insufficiency   . Arthritis    "probably in my knees before they replaced them" (01/28/2015)  . Atrial flutter (HCC)   . CAD (coronary artery disease)   . CHF (congestive heart failure) (HCC)   . Descending aortic aneurysm (HCC)   . DVT (deep venous thrombosis) (HCC)    ?LLE post knee surgery  . History of blood transfusion    "after one of my knee surgeries"  . HTN (hypertension)   . Hyperlipidemia   . Thoracic aneurysm   . Tobacco abuse     Past Surgical History:  Procedure Laterality Date  . AORTIC VALVE REPLACEMENT  02/19/2009  . AORTO-FEMORAL BYPASS GRAFT  03/2010   ascending aortic and arch aneurysm repair/notes 04/09/2009  . CARDIAC VALVE REPLACEMENT    . CAROTID-SUBCLAVIAN BYPASS GRAFT Left 08/26/2013   Procedure: BYPASS  GRAFT CAROTID-SUBCLAVIAN;  Surgeon: Nada Libman, MD;  Location: [facility] OR;  Service: Vascular;  Laterality: Left;  . CATARACT EXTRACTION W/ INTRAOCULAR LENS  IMPLANT, BILATERAL Bilateral   . ENDOVASCULAR STENT INSERTION N/A 08/26/2013   Procedure:  THORACIC STENT GRAFT INSERTION;  Surgeon: Nada Libman, MD;  Location: [facility] OR;  Service: Vascular;  Laterality: N/A;  . JOINT REPLACEMENT    . KNEE ARTHROSCOPY Bilateral   . REPLACEMENT TOTAL KNEE Bilateral   . right groin lymphocele Right 03/29/2009  . THORACENTESIS  2010 X 2  . TONSILLECTOMY       Current Outpatient Prescriptions  Medication Sig Dispense Refill  . amiodarone (PACERONE) 200 MG tablet Take 1 tablet (200 mg total) by mouth daily. 30 tablet 11  . apixaban (ELIQUIS) 5 MG TABS tablet Take 1 tablet (5 mg total) by mouth 2 (two) times daily. 60 tablet 11  . atorvastatin (LIPITOR) 10 MG tablet Take 1 tablet (10 mg total) by mouth daily. 30 tablet 11  . Ferrous Sulfate (IRON) 325 (65 Fe) MG TABS Take by mouth daily.    . furosemide (LASIX) 40 MG tablet Take 1 tablet (40 mg total) by mouth 2 (two) times daily. 60 tablet 11  . loratadine (CLARITIN) 10 MG tablet Take 1 tablet (10 mg total)  by mouth daily. 30 tablet 11  . metoprolol succinate (TOPROL-XL) 25 MG 24 hr tablet Take 0.5 tablets (12.5 mg total) by mouth at bedtime. 15 tablet 11  . metoprolol tartrate (LOPRESSOR) 25 MG tablet Take 0.5 tablets (12.5 mg total) by mouth at bedtime. 45 tablet 1  . Multiple Vitamin (MULTIVITAMIN WITH MINERALS) TABS tablet Take 1 tablet by mouth daily.    . nitroGLYCERIN (NITROSTAT) 0.4 MG SL tablet Place 0.4 mg under the tongue every 5 (five) minutes as needed for chest pain (X3 DOSES BEFORE CALLING 911).    . potassium chloride SA (KLOR-CON M20) 20 MEQ tablet Take 2 tablets (40 mEq total) by mouth daily. 60 tablet 11  . tamsulosin (FLOMAX) 0.4 MG CAPS capsule Take 0.4 mg by mouth 2 (two) times daily.      No current facility-administered medications  for this visit.     Allergies:   Lortab [hydrocodone-acetaminophen] and Morphine and related    Social History:  The patient  reports that he quit smoking about 42 years ago. His smoking use included Cigarettes. He smoked 0.00 packs per day. He has never used smokeless tobacco. He reports that he does not drink alcohol or use drugs.   Family History:  The patient's family history includes Aortic aneurysm in his father; Celiac disease in his daughter; Hypertension in his mother.    ROS:  Please see the history of present illness.   Otherwise, review of systems are positive for Minimal swelling in his legs.   All other systems are reviewed and negative.    PHYSICAL EXAM: VS:  BP (!) 168/70 (BP Location: Right Arm, Patient Position: Sitting, Cuff Size: Normal)   Pulse (!) 52   Ht 5\' 11"  (1.803 m)   Wt 190 lb 12.8 oz (86.5 kg)   SpO2 98%   BMI 26.61 kg/m  , BMI Body mass index is 26.61 kg/m. GEN: Well nourished, well developed, in no acute distress  HEENT: normal  Neck: no JVD, carotid bruits, or masses Cardiac: RRR; systolic murmurs;q   rubs, or gallops, bilateral trace leg edema  Respiratory:  clear to auscultation bilaterally, normal work of breathing GI: soft, nontender, nondistended, + BS MS: no deformity or atrophy  Skin: warm and dry, no rash Neuro:  Strength and sensation are intact Psych: euthymic mood, full affect     Recent Labs: No results found for requested labs within last 8760 hours.   Lipid Panel    Component Value Date/Time   CHOL 152 10/10/2013 1220   TRIG 116 10/07/2013 0320   HDL 38 (L) 07/29/2010 0210   CHOLHDL 3.6 07/29/2010 0210   VLDL 18 07/29/2010 0210   LDLCALC  07/29/2010 0210    80        Total Cholesterol/HDL:CHD Risk Coronary Heart Disease Risk Table                     Men   Women  1/2 Average Risk   3.4   3.3  Average Risk       5.0   4.4  2 X Average Risk   9.6   7.1  3 X Average Risk  23.4   11.0        Use the calculated  Patient Ratio above and the CHD Risk Table to determine the patient's CHD Risk.        ATP III CLASSIFICATION (LDL):  <100     mg/dL   Optimal  485-462  mg/dL  Near or Above                    Optimal  130-159  mg/dL   Borderline  811-914  mg/dL   High  >782     mg/dL   Very High     Other studies Reviewed: Additional studies/ records that were reviewed today with results demonstrating: CT imaging showed no endoleak in 2017.   ASSESSMENT AND PLAN:  1. 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 Continue aggressive secondary prevention.  Continue to try to be active. 2. Aortic dissection: s/p extensive repair several years ago.  Followed by Dr. Tyrone Sage. 3. Mitral regurgitation: No CHF sx. 4. AFib: Eliquis for stroke prevention. No bleeding problems. I stressed the importance of being careful to avoid falls given that he is on a blood thinner. 5. No further syncope since reducing beta blocker. Watch for lightheadedness or syncope.  THey will clarify which type of metoprolol they are taking.  Mild bradycardia at times. No indication for pacer.    Current medicines are reviewed at length with the patient today.  The patient concerns regarding his medicines were addressed.  The following changes have been made:  No change  Labs/ tests ordered today include:  No orders of the defined types were placed in this encounter.   Recommend 150 minutes/week of aerobic exercise Low fat, low carb, high fiber diet recommended  Disposition:   FU in 9 months   Signed, Lance Muss, MD  08/21/2016 12:05 PM    [facility] [address] [place], [place], [place]  [address] Phone: [phone]; Fax: [phone]

## 2016-08-21 ENCOUNTER — Ambulatory Visit (INDEPENDENT_AMBULATORY_CARE_PROVIDER_SITE_OTHER): Payer: Medicare Other | Admitting: Interventional Cardiology

## 2016-08-21 ENCOUNTER — Telehealth: Payer: Self-pay | Admitting: *Deleted

## 2016-08-21 ENCOUNTER — Encounter: Payer: Self-pay | Admitting: Interventional Cardiology

## 2016-08-21 ENCOUNTER — Other Ambulatory Visit: Payer: Self-pay | Admitting: *Deleted

## 2016-08-21 VITALS — BP 168/70 | HR 52 | Ht 71.0 in | Wt 190.8 lb

## 2016-08-21 DIAGNOSIS — I2510 Atherosclerotic heart disease of native coronary artery without angina pectoris: Secondary | ICD-10-CM

## 2016-08-21 DIAGNOSIS — I480 Paroxysmal atrial fibrillation: Secondary | ICD-10-CM

## 2016-08-21 DIAGNOSIS — R001 Bradycardia, unspecified: Secondary | ICD-10-CM | POA: Diagnosis not present

## 2016-08-21 DIAGNOSIS — I7101 Dissection of thoracic aorta: Secondary | ICD-10-CM | POA: Diagnosis not present

## 2016-08-21 DIAGNOSIS — Z952 Presence of prosthetic heart valve: Secondary | ICD-10-CM

## 2016-08-21 DIAGNOSIS — I71019 Dissection of thoracic aorta, unspecified: Secondary | ICD-10-CM

## 2016-08-21 MED ORDER — METOPROLOL TARTRATE 25 MG PO TABS: 12.5000 mg | ORAL_TABLET | Freq: Every day | ORAL | 3 refills | Status: DC

## 2016-08-21 NOTE — Telephone Encounter (Signed)
**Note De-Identified  Obfuscation** Per Dr Eldridge Dace it is ok to fill as reported by the pts wife. Thanks.

## 2016-08-21 NOTE — Telephone Encounter (Signed)
Patients wife called and stated that the patient was seen today and they were instructed to call the office when they got home to let the nurse know which metoprolol the patient was taking. Patient is taking metoprolol tartrate 12.5 mg qhs. Okay to update med list and refill as patient reported taking? Please advise. Thanks, MI

## 2016-08-21 NOTE — Patient Instructions (Signed)
Medication Instructions:  Same-no changes  Labwork: None  Testing/Procedures: None  Follow-Up: Your physician wants you to follow-up in: 9 months You will receive a reminder letter in the mail two months in advance. If you don't receive a letter, please call our office to schedule the follow-up appointment.   Any Other Special Instructions Will Be Listed Below (If Applicable). Please call us at [phone] to let us know what type of Metoprolol you are taking.   If you need a refill on your cardiac medications before your next appointment, please call your pharmacy.

## 2016-08-29 ENCOUNTER — Other Ambulatory Visit: Payer: Self-pay | Admitting: *Deleted

## 2016-08-29 DIAGNOSIS — I7101 Dissection of thoracic aorta: Secondary | ICD-10-CM

## 2016-08-29 DIAGNOSIS — I71019 Dissection of thoracic aorta, unspecified: Secondary | ICD-10-CM

## 2016-10-11 DIAGNOSIS — I7101 Dissection of thoracic aorta: Secondary | ICD-10-CM | POA: Diagnosis not present

## 2016-10-19 ENCOUNTER — Ambulatory Visit
Admission: RE | Admit: 2016-10-19 | Discharge: 2016-10-19 | Disposition: A | Discharge status: Home / Self Care | Payer: Medicare Other | Source: Ambulatory Visit | Attending: Cardiothoracic Surgery | Admitting: Cardiothoracic Surgery

## 2016-10-19 ENCOUNTER — Ambulatory Visit (INDEPENDENT_AMBULATORY_CARE_PROVIDER_SITE_OTHER): Payer: Medicare Other | Admitting: Cardiothoracic Surgery

## 2016-10-19 ENCOUNTER — Encounter: Payer: Self-pay | Admitting: Cardiothoracic Surgery

## 2016-10-19 VITALS — BP 124/57 | HR 56 | Resp 20 | Ht 71.0 in | Wt 194.0 lb

## 2016-10-19 DIAGNOSIS — I7101 Dissection of thoracic aorta: Secondary | ICD-10-CM

## 2016-10-19 DIAGNOSIS — I71019 Dissection of thoracic aorta, unspecified: Secondary | ICD-10-CM

## 2016-10-19 DIAGNOSIS — I2510 Atherosclerotic heart disease of native coronary artery without angina pectoris: Secondary | ICD-10-CM

## 2016-10-19 DIAGNOSIS — I712 Thoracic aortic aneurysm, without rupture: Secondary | ICD-10-CM | POA: Diagnosis not present

## 2016-10-19 MED ORDER — IOPAMIDOL (ISOVUE-370) INJECTION 76%
75.0000 mL | Freq: Once | INTRAVENOUS | Status: AC | PRN
  Administered 2016-10-19: 75 mL via INTRAVENOUS

## 2016-10-19 NOTE — Progress Notes (Signed)
Patient ID: [name] [name], male   DOB: Jun 27, 1940, 76 y.o.   MRN: [id number]          [name] [name] [facility] Record #[id number] Date of Birth: 1941/03/13  Referring: [name], MD Primary Care: [name], MD  Chief Complaint:    Chief Complaint  Patient presents with  . 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  SURGICAL PROCEDURE: Left subclavian to carotid bypass with 8 mm graft  and ligation of proximal left subclavian artery. Endovascular repair of  descending thoracic aorta with coverage of the left subclavian and  distal extension with a proximal 40 x 40 x 20 GORE-TEX device with a  second extension 45 x 45 x 15 cm and aortogram x3.  2010- aortic valve replacement replacement of aortic root and ascending aorta and replacement of aortic arch to the left subclavian artery.  History of Present Illness:     Patient is a 76 year old male who on 02/19/2009 presented In respiratory distress on a ventilator with a large ascending aneurysm and acute aortic insufficiency and aortic arch aneurysm and at that time underwent aortic valve replacement replacement of aortic root and ascending aorta and replacement of aortic arch to the left subclavian artery. Since that time he's had an acute myocardial infarction and had a stent placed. In November 2014 prior to his yearly followup he [facility] with an acute type III aortic dissection. He was treated medically, there is hospital stay he did develop persistent atrial fibrillation and ultimately was discharged home on anticoagulation.   Prior to his followup scan for the type III aortic  dissection he was really admitted with signs and symptoms of congestive heart failure followup scan showed significant enlargement of the type III aortic dissection. August 28 2013  he underwent left subclavian to carotid bypass ligation of the proximal left subclavian artery an endovascular repair of descending thoracic aorta with coverage of the left subclavian artery. \  Since seen last year his  medical history is been relatively stable .    Current Activity/ Functional Status: Mobility/Ambulation: Independent with mobility prior to admission in the home on all surfaces with no assistive devices for unlimited distances.   Zubrod Score: At the time of surgery this patient's most appropriate activity status/level should be described as: []  Normal activity, no symptoms []  Symptoms, fully ambulatory [x]  Symptoms, in bed less than or equal to 50% of the time []  Symptoms, in bed greater than 50% of the time but less than 100% []  Bedridden []  Moribund   Past Medical History:  Diagnosis Date  . Aortic valve insufficiency   . Arthritis    "probably in my knees before they replaced them" (01/28/2015)  . Atrial flutter (HCC)   . CAD (coronary artery disease)   . CHF (congestive heart failure) (HCC)   . Descending aortic aneurysm (HCC)   . DVT (deep venous thrombosis) (HCC)    ?LLE post knee surgery  . History of blood transfusion    "after one of my knee surgeries"  . HTN (hypertension)   . Hyperlipidemia   . Thoracic aneurysm   . Tobacco abuse     Past Surgical History:  Procedure Laterality Date  .  AORTIC VALVE REPLACEMENT  02/19/2009  . AORTO-FEMORAL BYPASS GRAFT  03/2010   ascending aortic and arch aneurysm repair/notes 04/09/2009  . CARDIAC VALVE REPLACEMENT    . CAROTID-SUBCLAVIAN BYPASS GRAFT Left 08/26/2013   Procedure: BYPASS GRAFT CAROTID-SUBCLAVIAN;  Surgeon: Nada Libman, MD;  Location: [facility] OR;  Service: Vascular;  Laterality: Left;  . CATARACT EXTRACTION W/  INTRAOCULAR LENS  IMPLANT, BILATERAL Bilateral   . ENDOVASCULAR STENT INSERTION N/A 08/26/2013   Procedure:  THORACIC STENT GRAFT INSERTION;  Surgeon: Nada Libman, MD;  Location: [facility] OR;  Service: Vascular;  Laterality: N/A;  . JOINT REPLACEMENT    . KNEE ARTHROSCOPY Bilateral   . REPLACEMENT TOTAL KNEE Bilateral   . right groin lymphocele Right 03/29/2009  . THORACENTESIS  2010 X 2  . TONSILLECTOMY      History  Smoking Status  . Former Smoker  . Packs/day: 0.00  . Types: Cigarettes  . Quit date: 04/16/1974  Smokeless Tobacco  . Never Used    History  Alcohol Use No      Allergies  Allergen Reactions  . Lortab [Hydrocodone-Acetaminophen] Hives  . Morphine And Related Hives    Current Outpatient Prescriptions  Medication Sig Dispense Refill  . amiodarone (PACERONE) 200 MG tablet Take 1 tablet (200 mg total) by mouth daily. 30 tablet 11  . apixaban (ELIQUIS) 5 MG TABS tablet Take 1 tablet (5 mg total) by mouth 2 (two) times daily. 60 tablet 11  . atorvastatin (LIPITOR) 10 MG tablet Take 1 tablet (10 mg total) by mouth daily. 30 tablet 11  . Ferrous Sulfate (IRON) 325 (65 Fe) MG TABS Take by mouth daily.    . furosemide (LASIX) 40 MG tablet Take 1 tablet (40 mg total) by mouth 2 (two) times daily. 60 tablet 11  . loratadine (CLARITIN) 10 MG tablet Take 1 tablet (10 mg total) by mouth daily. 30 tablet 11  . metoprolol tartrate (LOPRESSOR) 25 MG tablet Take 0.5 tablets (12.5 mg total) by mouth at bedtime. 45 tablet 3  . Multiple Vitamin (MULTIVITAMIN WITH MINERALS) TABS tablet Take 1 tablet by mouth daily.    . nitroGLYCERIN (NITROSTAT) 0.4 MG SL tablet Place 0.4 mg under the tongue every 5 (five) minutes as needed for chest pain (X3 DOSES BEFORE CALLING 911).    . potassium chloride SA (KLOR-CON M20) 20 MEQ tablet Take 2 tablets (40 mEq total) by mouth daily. 60 tablet 11  . tamsulosin (FLOMAX) 0.4 MG CAPS capsule Take 0.4 mg by mouth 2 (two) times daily.      No current  facility-administered medications for this visit.       Physical Exam: BP (!) 124/57   Pulse (!) 56   Resp 20   Ht 5\' 11"  (1.803 m)   Wt 194 lb (88 kg)   SpO2 98% Comment: RA  BMI 27.06 kg/m   General appearance: alert, cooperative, appears older than stated age, fatigued, no distress and slowed mentation Neurologic: intact Heart: regular rate and rhythm, S1, S2 normal, no murmur, click, rub or gallop, normal apical impulse, no click and no rub, there is a bruit over the left supraclavicular area related to his carotid subclavian bypass Lungs: clear to auscultation bilaterally Abdomen: soft, non-tender; bowel sounds normal; no masses,  no organomegaly Extremities: extremities normal, atraumatic, no cyanosis or edema, mild  edema, redness or tenderness in the calves or thighs and no ulcers, gangrene or trophic changes Wound: The left neck incision is well-healed, has a strong probable  left brachial and radial pulses    Diagnostic Studies & Laboratory data:     Recent Radiology Findings:  Ct Angio Chest Aorta W &/or Wo Contrast  Result Date: 10/19/2016 CLINICAL DATA:  Thoracic aortic aneurysm, post stent. History of dissection. EXAM: CT ANGIOGRAPHY CHEST WITH CONTRAST TECHNIQUE: Multidetector CT imaging of the chest was performed using the standard protocol during bolus administration of intravenous contrast. Multiplanar CT image reconstructions and MIPs were obtained to evaluate the vascular anatomy. CONTRAST:  75 cc Isovue 370 IV COMPARISON:  09/23/2015 FINDINGS: Cardiovascular: Patient is status post stent graft in the aortic arch and descending thoracic aorta. Maximum diameter in the distal descending thoracic aorta is 4.5 cm, stable. No dissection. No evidence of endoleak. Prior aortic valve repair she. Prior tube graft repair of ascending thoracic aorta with graft to great vessels. 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 Cardiomegaly noted. Extensive coronary artery calcifications. Mediastinum/Nodes: No mediastinal, hilar, or axillary adenopathy. Lungs/Pleura: Linear areas of scarring in the lung bases. No confluent opacities or effusions. Upper Abdomen: Imaging into the upper abdomen shows no acute findings. Gallstones layering in the gallbladder. Musculoskeletal: Chest wall soft tissues are unremarkable. No acute bony abnormality. Review of the MIP images confirms the above findings. IMPRESSION: Status post tube graft repair of the ascending thoracic aorta and stent graft repair of the aortic arch and descending thoracic aorta. Stable size and appearance. No dissection or endoleak. No acute cardiopulmonary disease. Cholelithiasis. Electronically Signed   By: Charlett Nose M.D.   On: 10/19/2016 11:53   Ct Angio Chest Aorta W/cm &/or Wo/cm  09/23/2015  CLINICAL DATA:  F/u s/p stent for TAA No injury No hx cancer Ex smoker x 30 years EXAM: CT ANGIOGRAPHY CHEST WITH CONTRAST TECHNIQUE: Multidetector CT imaging of the chest was performed using the standard protocol during bolus administration of intravenous contrast. Multiplanar CT image reconstructions and MIPs were obtained to evaluate the vascular anatomy. CONTRAST:  80 mL Isovue 370 IV COMPARISON:  01/28/2015 FINDINGS: Vascular: Right arm IV contrast injection. SVC patent. Four-chamber cardiac enlargement. Mildly dilated central pulmonary arteries. Satisfactory opacification of pulmonary arteries noted, and there is no evidence of pulmonary emboli. Patent bilateral pulmonary veins drain into the left atrium. Scattered coronary calcifications. Previous AVR. Previous tube graft repair of the ascending aorta with graft to great vessels. There is thrombosis of the branch to the left subclavian artery. Patent left carotid to subclavian arterial bypass graft. Stent graft extends through the aortic arch to the distal descending thoracic aorta. Proximal  and distal tines appear well apposed. No endoleak. No dissection. Maximum aortic diameter 5.2 cm in the mid descending, previously 5.4 cm at the same level on previous examination by my measurement. The native distal descending thoracic aorta is dilated up to 3.9 cm diameter with moderate calcified plaque in the wall. The proximal abdominal aorta tapers to a diameter 3 cm at the level of the SMA, not visualized distally. Mediastinum/Lymph Nodes: No masses or pathologically enlarged lymph nodes identified. No pericardial effusion. Lungs/Pleura: 6 mm subpleural nodule in the anterior segment right upper lobe, previously 7.5 mm. Lungs otherwise clear. No pleural effusion. No pneumothorax. Mild emphysematous changes in the lung apices. Upper abdomen: At least 2 subcentimeter partially calcified stones in the dependent aspect of the gallbladder, nondilated. Musculoskeletal: Minimal spurring in the mid and lower thoracic spine. Previous median sternotomy. Review of the MIP images confirms  the above findings. IMPRESSION: 1. 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 2. Stable postop changes of AVR, ascending aortic repair, graft to brachiocephalic vessels, and left carotid subclavian bypass. 3. Cholelithiasis. Electronically Signed   By: Corlis Leak M.D.   On: 09/23/2015 12:30   ICt Angio Chest Aorta W/cm &/or Wo/cm  10/15/2014   CLINICAL DATA:  Thoracic aortic stent graft  EXAM: CT ANGIOGRAPHY CHEST WITH CONTRAST  TECHNIQUE: Multidetector CT imaging of the chest was performed using the standard protocol during bolus administration of intravenous contrast. Multiplanar CT image reconstructions and MIPs were obtained to evaluate the vascular anatomy.  CONTRAST:  75 cc Isovue 370  COMPARISON:  02/12/2014  FINDINGS: Chest:  Thoracic aortic stent graft is stable in position. It begins  just distal to the takeoff of the innominate artery with a bovine configuration. There is no evidence of endoleak. Maximal aneurysm sac diameter on image 43 of series 5 measures 5.3 cm. This is improved compared to my direct measurements on the prior study. At that time, I measure a diameter of 5.6 cm. There is calcification between the innominate artery and aortic arch on image 31 of series 5.  There is persistent mild narrowing at the origin of the innominate artery just above the takeoff of the left common carotid artery. 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 This is unchanged. Right subclavian and common carotid arteries are patent. Origin of the right vertebral artery fills with contrast but then is non-opacified. Left common carotid artery is patent. Proximal left subclavian artery has been ligated. Left carotid to left subclavian bypass graft is patent. Left vertebral artery is dominant and patent. Left subclavian artery is patent.  Aortic valve replacement is visualized. The aortic root and ascending aorta are have a normal appearance without evidence of dissection. Maximal diameters 3.3 cm.  No obvious acute pulmonary thromboembolism.  Stable 5 mm right upper lobe pulmonary nodule on image 57 of series 602. Minimal dependent atelectasis  No pneumothorax or pleural effusion.  Cholelithiasis  Review of the MIP images confirms the above findings.  IMPRESSION: Stable position of the thoracic aortic stent graft. There is no evidence of endoleak and aneurysm sac diameter has diminished from 5.6 cm to 5.3 cm.  There is poor filling of the right vertebral artery. Early subclavian steal syndrome cannot be excluded in this patient suggesting significant narrowing of the right subclavian artery circuit. This could conceivably occur at its origin at the level of the stent graft. Carotid Doppler study with evaluation of vertebral artery flow was recommended. There is no obvious area of narrowing  on this study.   Electronically Signed   By: Jolaine Click M.D.   On: 10/15/2014 14:39    Ct Angio Chest Aorta W/cm &/or Wo/cm  02/12/2014   CLINICAL DATA:  Evaluate aortic dissection. Followup thoracic aortic aneurysm. Previous aortic valve replacement with thoracic aortic aneurysm stent graft.  EXAM: CT ANGIOGRAPHY CHEST AND ABDOMEN  TECHNIQUE: Multidetector CT imaging of the chest and abdomen was performed using the standard protocol during bolus administration of intravenous contrast. Multiplanar CT image reconstructions and MIPs were obtained to evaluate the vascular anatomy.  CONTRAST:  80mL OMNIPAQUE IOHEXOL 350 MG/ML SOLN  COMPARISON:  10/06/2013, 08/22/2013 as well as 05/18/2011  FINDINGS: CTA CHEST FINDINGS  Sternotomy wires are present. The lungs are  adequately inflated without consolidation or effusion. There is very minimal apical emphysematous disease. There is a 7-8 mm nodule over the anterior right upper lobe stable since 2012. There is moderate stable cardiomegaly. No change and coronary artery calcifications with suggestion of stent over the lateral circumflex artery. Evidence of previous aortic valve replacement. No pericardial or mediastinal fluid collection. No mediastinal or hilar adenopathy.  The posterior arch/descending thoracic aortic stent graft is unchanged which excludes the previously noted aneurysm/dissection. No current evidence of a dissection. Significantly less the low density thrombus in the native aneurysm sac. No evidence of endoleak. 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  Review of the MIP images confirms the above findings.  CTA ABDOMEN FINDINGS  Abdominal images demonstrate minimal cholelithiasis. The liver, spleen, pancreas and adrenal glands are within normal. Kidneys are normal in size without hydronephrosis or nephrolithiasis. No change in a 3.4 cm cyst over the upper pole  right kidney. Few other smaller subcentimeter renal cortical hypodensities likely cysts but too small to characterize. There is diverticulosis of the colon. Appendix is within normal. Visualize ureters are within normal.  There is mild calcified plaque over the abdominal aorta. There is no evidence of abdominal aortic dissection. There is mild aneurysmal dilatation of the infrarenal abdominal aorta measuring 3.0 cm in AP diameter just below the take-off of the renal arteries. The celiac axis, superior and inferior mesenteric arteries are patent. Single bilateral renal arteries are patent with mild focal narrowing at the origin of the right renal artery. There are degenerative changes of the spine.  Review of the MIP images confirms the above findings.  IMPRESSION: Stable aortic stent graft over the aortic arch and descending thoracic aorta with significant reduction in the mural thrombus of the native aneurysm sac. No evidence of dissection or endoleak.  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 No evidence of abdominal aortic dissection.  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  Stable moderate cardiomegaly with atherosclerotic coronary artery disease and left lateral circumflex stent. Previous median sternotomy and aortic valve replacement.  Mild cholelithiasis.  Bilateral stable renal cysts with the largest measuring 3.4 cm over the upper pole right kidney.  Diverticulosis of the colon.   Electronically Signed   By: Elberta Fortis M.D.   On: 02/12/2014 10:02    Ct Angio Head W/cm &/or Wo Cm  08/22/2013   CLINICAL DATA:  76 year old male with history of thoracic aneurysm status post repair. Progressive weight gain, swelling and shortness of Breath. Recently thought to have sinus infection, treated with antibiotics and steroids. Symptoms increasing. Initial encounter.  EXAM: CT ANGIOGRAPHY HEAD AND NECK  TECHNIQUE:  Multidetector CT imaging of the head and neck was performed using the standard protocol during bolus administration of intravenous contrast. Multiplanar CT image reconstructions and MIPs were obtained to evaluate the vascular anatomy. Carotid stenosis measurements (when applicable) are obtained utilizing NASCET criteria, using the distal internal carotid diameter as the denominator.  CONTRAST:  50mL OMNIPAQUE IOHEXOL 350 MG/ML SOLN  COMPARISON:  Chest abdomen and pelvis CTA from the same day reported separately.  FINDINGS: CTA HEAD FINDINGS  Calvarium intact. Negative scalp soft tissues. Cerebral volume is within normal limits for age. No midline shift, ventriculomegaly, mass effect, evidence of mass lesion, intracranial hemorrhage or evidence of cortically based acute infarction. Gray-white matter differentiation is within normal limits throughout the brain. No abnormal enhancement  identified.  VASCULAR FINDINGS:  Major intracranial venous structures are enhancing.  Dominant distal left vertebral artery is patent without stenosis despite some calcified plaque. Retrograde supply to the non dominant distal right vertebral artery. Right PICA and AICA are patent. Dominant left AICA. The basilar artery is patent without stenosis. SCA and PCA origins are within normal limits. Normal left posterior communicating artery, the right is diminutive or absent. Bilateral PCA branches are within normal limits.  Some cavernous sinus venous contrast artifact is present. Both ICA siphons are patent. Ophthalmic and posterior communicating arteries are within normal limits. Normal carotid termini. Normal MCA and ACA origins. Anterior communicating artery and bilateral ACA branches are within normal limits. Bilateral MCA branches are within normal limits.  Review of the MIP images confirms the above findings.  CTA NECK FINDINGS  Chest findings are reported separately (please see that report). Negative thyroid, larynx, pharynx,  parapharyngeal spaces, retropharyngeal space. Extensive dental hardware artifact. Negative visualized sublingual space, submandibular glands and submandibular space. Negative parotid glands. No acute orbits soft tissue findings.  No cervical lymphadenopathy. Mucous retention cyst left maxillary sinus. Other Visualized paranasal sinuses and mastoids are clear. No acute osseous abnormality identified. Degenerative changes in the cervical spine.  VASCULAR FINDINGS:  SEE CHEST CTA FINDINGS FROM TODAY REPORTED SEPARATELY.  Both internal jugular veins are patent. The left subclavian and innominate vein are patent. Negative non contrast appearance of the right subclavian. The right innominate vein is patent.  Great vessel origins are patent, dolichoectatic. Widespread mild soft and calcified circumferential plaque in the great vessels.  Patent, mildly dolichoectatic right CCA. Patent right carotid bifurcation with no right ICA stenosis despite calcified plaque. Tortuous, mildly dolichoectatic cervical right ICA.  No proximal right subclavian artery stenosis, with some dolichoectasia. Non dominant right vertebral artery is occluded at its origin. Distal right vertebral artery flow is reconstituted briefly at the C2-C3 level, but then re- occludes below the skullbase. Intracranial findings detailed above.  Patent, the dolichoectatic left CCA. Calcified plaque at the left carotid bifurcation which is significantly affected by dental streak artifact. Loss of enhancement in the left ICA origin and bulb felt to be artifactual (see series 12, image 172). Tortuous/dolichoectatic but otherwise negative cervical left ICA be on that level.  No proximal left subclavian artery stenosis. Dominant left vertebral artery origin widely patent. Tortuous proximal left vertebral artery. Dominant left vertebral artery tortuous and patent to the skullbase.  Review of the MIP images confirms the above findings.  IMPRESSION: 1. Abnormal thoracic  aorta, see chest CTA from today reported separately. 2. Patent, dolichoectatic great vessels without arterial dissection in the neck. 3. Occluded non dominant right vertebral artery. Patent, dominant left vertebral artery. 4. Suspect artifactual decreased enhancement of the left ICA origin, related to severe dental hardware artifact. Absence of any proximal left ICA stenosis could be confirmed with either carotid Doppler or neck MRA if necessary. 5. Distal right vertebral artery supplied in a retrograde fashion from the left. Intracranial atherosclerosis, but no intracranial stenosis and otherwise negative intracranial CTA. 6.  No acute intracranial abnormality.   Electronically Signed   By: Augusto Gamble M.D.   On: 08/22/2013 12:26       Recent Lab Findings: Lab Results  Component Value Date   WBC 4.6 01/29/2015   HGB 9.8 (L) 01/29/2015   HCT 32.3 (L) 01/29/2015   PLT 208 01/29/2015   GLUCOSE 95 01/29/2015   CHOL 152 10/10/2013   TRIG 116 10/07/2013   HDL 38 (  L) 07/29/2010   LDLCALC  07/29/2010    80        Total Cholesterol/HDL:CHD Risk Coronary Heart Disease Risk Table                     Men   Women  1/2 Average Risk   3.4   3.3  Average Risk       5.0   4.4  2 X Average Risk   9.6   7.1  3 X Average Risk  23.4   11.0        Use the calculated Patient Ratio above and the CHD Risk Table to determine the patient's CHD Risk.        ATP III CLASSIFICATION (LDL):  <100     mg/dL   Optimal  333-832  mg/dL   Near or Above                    Optimal  130-159  mg/dL   Borderline  919-166  mg/dL   High  >060     mg/dL   Very High   ALT 17 04/59/9774   AST 24 01/29/2015   NA 142 01/29/2015   K 3.5 01/29/2015   CL 105 01/29/2015   CREATININE 0.97 01/29/2015   BUN 16 01/29/2015   CO2 27 01/29/2015   TSH 1.102 05/08/2013   INR 1.42 01/28/2015   HGBA1C 6.1 (H) 08/27/2013      Assessment / Plan:   Patient status post left subclavian carotid bypass,  ligation of subclavian artery  and endovascular repair of a chronic type III aortic dissection , with previous biologic Bentall and arch replacement . Stable appearance of thoracic aortic stent- Status post tube graft repair of the ascending thoracic aorta and stent graft repair of the aortic arch and descending thoracic aorta.  Repeat CTA of chest 18 months   Delight Ovens MD 10/19/2016 1:41 PM

## 2016-12-06 ENCOUNTER — Other Ambulatory Visit: Payer: Self-pay | Admitting: Interventional Cardiology

## 2016-12-06 NOTE — Telephone Encounter (Signed)
Pt was seen by Dr Eldridge Dace on 08/21/16. Wt at OV with Dr Tyrone Sage on 10/19/16 was 88 Kg. SCr collected on 10/11/16 @ PCP was 0.96. Age 76 yrs old. Will refill  5mg  BID Eliquis .

## 2016-12-18 ENCOUNTER — Other Ambulatory Visit: Payer: Self-pay | Admitting: Interventional Cardiology

## 2016-12-28 ENCOUNTER — Other Ambulatory Visit: Payer: Self-pay | Admitting: Interventional Cardiology

## 2017-01-13 DIAGNOSIS — H1131 Conjunctival hemorrhage, right eye: Secondary | ICD-10-CM | POA: Diagnosis not present

## 2017-01-15 ENCOUNTER — Other Ambulatory Visit: Payer: Self-pay

## 2017-02-09 ENCOUNTER — Telehealth: Payer: Self-pay | Admitting: Interventional Cardiology

## 2017-02-09 NOTE — Telephone Encounter (Signed)
Called and spoke to patient. Patient states that they asked about having labs done prior to his appointment that was just scheduled in November. Patient states that he normally has his labs checked at [facility] (PCP). Will forward to [name] to see if patient needs labs prior to his appointment.

## 2017-02-09 NOTE — Telephone Encounter (Signed)
New message    Pt wife is calling stating that pt has lab work done before appts, but the order gets mailed to their house so they can do it in [place]. Can the order be mailed.

## 2017-02-13 DIAGNOSIS — H34811 Central retinal vein occlusion, right eye, with macular edema: Secondary | ICD-10-CM | POA: Diagnosis not present

## 2017-02-21 NOTE — Telephone Encounter (Signed)
Those labs are fine.  Thanks

## 2017-03-01 NOTE — Telephone Encounter (Signed)
Called and spoke to wife. Lab order request sent to patient's home to be drawn at PCP. Address verified.

## 2017-03-05 DIAGNOSIS — H35032 Hypertensive retinopathy, left eye: Secondary | ICD-10-CM | POA: Diagnosis not present

## 2017-03-05 DIAGNOSIS — H34811 Central retinal vein occlusion, right eye, with macular edema: Secondary | ICD-10-CM | POA: Diagnosis not present

## 2017-03-05 DIAGNOSIS — H3582 Retinal ischemia: Secondary | ICD-10-CM | POA: Diagnosis not present

## 2017-03-05 DIAGNOSIS — H43813 Vitreous degeneration, bilateral: Secondary | ICD-10-CM | POA: Diagnosis not present

## 2017-04-09 DIAGNOSIS — H3582 Retinal ischemia: Secondary | ICD-10-CM | POA: Diagnosis not present

## 2017-04-09 DIAGNOSIS — H35032 Hypertensive retinopathy, left eye: Secondary | ICD-10-CM | POA: Diagnosis not present

## 2017-04-09 DIAGNOSIS — H43813 Vitreous degeneration, bilateral: Secondary | ICD-10-CM | POA: Diagnosis not present

## 2017-04-09 DIAGNOSIS — H34811 Central retinal vein occlusion, right eye, with macular edema: Secondary | ICD-10-CM | POA: Diagnosis not present

## 2017-04-20 ENCOUNTER — Encounter: Payer: Self-pay | Admitting: Interventional Cardiology

## 2017-04-20 DIAGNOSIS — Z23 Encounter for immunization: Secondary | ICD-10-CM | POA: Diagnosis not present

## 2017-04-20 DIAGNOSIS — Z79899 Other long term (current) drug therapy: Secondary | ICD-10-CM | POA: Diagnosis not present

## 2017-04-20 DIAGNOSIS — E785 Hyperlipidemia, unspecified: Secondary | ICD-10-CM | POA: Diagnosis not present

## 2017-05-02 NOTE — Progress Notes (Signed)
Cardiology Office Note   Date:  05/03/2017   ID:  [name], [name] 04-30-1941, MRN [id number]  PCP:  [name], MD    No chief complaint on file.  CAD, AFib  Wt Readings from Last 3 Encounters:  05/03/17 190 lb 6.4 oz (86.4 kg)  10/19/16 194 lb (88 kg)  08/21/16 190 lb 12.8 oz (86.5 kg)       History of Present Illness: [name] [name] is a 76 y.o. male   with h/o aortic disection, CAD - DES to circumflex in 2011. 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 He was admitted to the hospital on 10/05/13 for urosepsis and was on the ventilator for 2 days. Also has thoracentesis for pleural effusion (1200cc).   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 He has gone back to walking regularly.   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  He had a retinal vein thrombosis apparently, per the patient's report.  Denies : Chest pain. Dizziness. Leg edema. Nitroglycerin use. Orthopnea. Palpitations. Paroxysmal nocturnal dyspnea. Shortness of breath. Syncope.   Overall, he feels that he is doing well from a cardiac standpoint.      Past Medical History:  Diagnosis Date  . Aortic valve insufficiency   . Arthritis    "probably in my knees before they replaced them" (01/28/2015)  . Atrial flutter (HCC)   . CAD (coronary artery disease)   . CHF (congestive heart failure) (HCC)   . Descending aortic aneurysm (HCC)   . DVT (deep venous thrombosis) (HCC)    ?LLE post knee surgery  . History of blood transfusion    "after one of my knee surgeries"  . HTN (hypertension)   . Hyperlipidemia   . Thoracic aneurysm   . Tobacco abuse     Past Surgical History:  Procedure Laterality Date  . AORTIC VALVE REPLACEMENT  02/19/2009  . AORTO-FEMORAL BYPASS GRAFT  03/2010   ascending aortic and arch aneurysm repair/notes 04/09/2009  . CARDIAC VALVE REPLACEMENT    . CAROTID-SUBCLAVIAN BYPASS GRAFT Left 08/26/2013   Procedure: BYPASS GRAFT CAROTID-SUBCLAVIAN;  Surgeon: Nada Libman, MD;  Location: [facility] OR;  Service: Vascular;  Laterality: Left;  . CATARACT EXTRACTION W/ INTRAOCULAR LENS  IMPLANT, BILATERAL Bilateral   . ENDOVASCULAR STENT INSERTION N/A 08/26/2013   Procedure:  THORACIC STENT GRAFT INSERTION;  Surgeon: Nada Libman, MD;  Location: [facility] OR;  Service: Vascular;  Laterality: N/A;  . JOINT REPLACEMENT    . KNEE ARTHROSCOPY Bilateral   . REPLACEMENT TOTAL KNEE Bilateral   . right groin lymphocele Right 03/29/2009  . THORACENTESIS  2010 X 2  . TONSILLECTOMY       Current Outpatient Medications  Medication Sig Dispense Refill  . amiodarone (PACERONE) 200 MG tablet TAKE ONE TABLET BY MOUTH ONCE DAILY 30 tablet 11  . atorvastatin (LIPITOR) 10 MG tablet TAKE ONE TABLET BY MOUTH ONCE DAILY 30 tablet 11  . ELIQUIS 5 MG TABS tablet TAKE ONE TABLET BY MOUTH TWICE DAILY 180 tablet 3  . furosemide (LASIX) 40 MG tablet Take 1 tablet (40 mg total) by mouth 2 (two) times daily. 60 tablet 8  . KLOR-CON M20 20 MEQ tablet TAKE TWO TABLETS BY MOUTH ONCE DAILY 60 tablet 11  . loratadine (CLARITIN) 10 MG tablet Take 1 tablet (10 mg total) by mouth daily. 30 tablet 11  . metoprolol tartrate (LOPRESSOR) 25 MG tablet Take  0.5 tablets (12.5 mg total) by mouth at bedtime. 45 tablet 3  . Multiple Vitamin (MULTIVITAMIN WITH MINERALS) TABS tablet Take 1 tablet by mouth daily.    . nitroGLYCERIN (NITROSTAT) 0.4 MG SL tablet Place 0.4 mg under the tongue every 5 (five) minutes as needed for chest pain (X3 DOSES BEFORE CALLING 911).    . tamsulosin (FLOMAX) 0.4 MG CAPS capsule Take 0.4 mg by mouth 2 (two) times daily.      No current facility-administered medications for this visit.     Allergies:   Lortab [hydrocodone-acetaminophen] and Morphine and related    Social History:  The patient  reports that he quit smoking about 43 years ago. His smoking use included cigarettes. He smoked 0.00 packs per day. he has  never used smokeless tobacco. He reports that he does not drink alcohol or use drugs.   Family History:  The patient's family history includes Aortic aneurysm in his father; Celiac disease in his daughter; Hypertension in his mother.    ROS:  Please see the history of present illness.   Otherwise, review of systems are positive for visual issues.   All other systems are reviewed and negative.    PHYSICAL EXAM: VS:  BP 126/62   Pulse 62   Ht 5\' 11"  (1.803 m)   Wt 190 lb 6.4 oz (86.4 kg)   SpO2 95%   BMI 26.56 kg/m  , BMI Body mass index is 26.56 kg/m. GEN: Well nourished, well developed, in no acute distress  HEENT: normal  Neck: no JVD, carotid bruits, or masses Cardiac: RRR; no murmurs, rubs, or gallops,no edema  Respiratory:  clear to auscultation bilaterally, normal work of breathing GI: soft, nontender, nondistended, + BS MS: no deformity or atrophy  Skin: warm and dry, no rash Neuro:  Strength and sensation are intact Psych: euthymic mood, full affect   EKG:   The ekg ordered 3/18 demonstrates NSR, nonspecific ST changes   Recent Labs: No results found for requested labs within last 8760 hours.   Lipid Panel    Component Value Date/Time   CHOL 152 10/10/2013 1220   TRIG 116 10/07/2013 0320   HDL 38 (L) 07/29/2010 0210   CHOLHDL 3.6 07/29/2010 0210   VLDL 18 07/29/2010 0210   LDLCALC  07/29/2010 0210    80        Total Cholesterol/HDL:CHD Risk Coronary Heart Disease Risk Table                     Men   Women  1/2 Average Risk   3.4   3.3  Average Risk       5.0   4.4  2 X Average Risk   9.6   7.1  3 X Average Risk  23.4   11.0        Use the calculated Patient Ratio above and the CHD Risk Table to determine the patient's CHD Risk.        ATP III CLASSIFICATION (LDL):  <100     mg/dL   Optimal  725-366  mg/dL   Near or Above                    Optimal  130-159  mg/dL   Borderline  440-347  mg/dL   High  >425     mg/dL   Very High     Other studies  Reviewed: Additional studies/ records that were reviewed today with results demonstrating: LVEF  50-55%, mild mitral regurgitaton.   ASSESSMENT AND PLAN:  1. CAD/Old MI:  No angina on medical therapy.  COntinue aggressive medical therapy. Refill NTG.   2. Aortic dissection: s/p repair.  Follows with Dr. Tyrone SageGerhardt. 3. Mitral regurgitation: No CHF sx.  4. AFib: Eliquis for stroke prevention.  No sx of palpitations. 5. Bradycardia : beta blocker reduced after syncope in the past. No further syncope.    Current medicines are reviewed at length with the patient today.  The patient concerns regarding his medicines were addressed.  The following changes have been made:  No change  Labs/ tests ordered today include:  No orders of the defined types were placed in this encounter.   Recommend 150 minutes/week of aerobic exercise Low fat, low carb, high fiber diet recommended  Disposition:   FU in 1 year   Signed, Lance MussJayadeep Yorel Redder, MD  05/03/2017 11:32 AM    [facility] [address], [place], [place]  [address] Phone: [phone]; Fax: [phone]

## 2017-05-03 ENCOUNTER — Encounter: Payer: Self-pay | Admitting: Interventional Cardiology

## 2017-05-03 ENCOUNTER — Ambulatory Visit (INDEPENDENT_AMBULATORY_CARE_PROVIDER_SITE_OTHER): Payer: Medicare Other | Admitting: Interventional Cardiology

## 2017-05-03 VITALS — BP 126/62 | HR 62 | Ht 71.0 in | Wt 190.4 lb

## 2017-05-03 DIAGNOSIS — I2510 Atherosclerotic heart disease of native coronary artery without angina pectoris: Secondary | ICD-10-CM | POA: Diagnosis not present

## 2017-05-03 DIAGNOSIS — R001 Bradycardia, unspecified: Secondary | ICD-10-CM | POA: Diagnosis not present

## 2017-05-03 DIAGNOSIS — I7101 Dissection of thoracic aorta: Secondary | ICD-10-CM | POA: Diagnosis not present

## 2017-05-03 DIAGNOSIS — I252 Old myocardial infarction: Secondary | ICD-10-CM

## 2017-05-03 DIAGNOSIS — I480 Paroxysmal atrial fibrillation: Secondary | ICD-10-CM | POA: Diagnosis not present

## 2017-05-03 DIAGNOSIS — Z952 Presence of prosthetic heart valve: Secondary | ICD-10-CM | POA: Diagnosis not present

## 2017-05-03 DIAGNOSIS — I71019 Dissection of thoracic aorta, unspecified: Secondary | ICD-10-CM

## 2017-05-03 MED ORDER — FUROSEMIDE 40 MG PO TABS: 40.0000 mg | ORAL_TABLET | Freq: Two times a day (BID) | ORAL | 3 refills | Status: DC

## 2017-05-03 MED ORDER — NITROGLYCERIN 0.4 MG SL SUBL
0.4000 mg | SUBLINGUAL_TABLET | SUBLINGUAL | 3 refills | Status: DC | PRN
Start: 2017-05-03 — End: 2019-05-29

## 2017-05-03 MED ORDER — METOPROLOL TARTRATE 25 MG PO TABS: 12.5000 mg | ORAL_TABLET | Freq: Every day | ORAL | 3 refills | Status: DC

## 2017-05-03 NOTE — Patient Instructions (Signed)

## 2017-05-21 DIAGNOSIS — H34811 Central retinal vein occlusion, right eye, with macular edema: Secondary | ICD-10-CM | POA: Diagnosis not present

## 2017-05-21 DIAGNOSIS — H3582 Retinal ischemia: Secondary | ICD-10-CM | POA: Diagnosis not present

## 2017-05-21 DIAGNOSIS — H35032 Hypertensive retinopathy, left eye: Secondary | ICD-10-CM | POA: Diagnosis not present

## 2017-05-21 DIAGNOSIS — H43813 Vitreous degeneration, bilateral: Secondary | ICD-10-CM | POA: Diagnosis not present

## 2017-07-09 DIAGNOSIS — H43813 Vitreous degeneration, bilateral: Secondary | ICD-10-CM | POA: Diagnosis not present

## 2017-07-09 DIAGNOSIS — H35032 Hypertensive retinopathy, left eye: Secondary | ICD-10-CM | POA: Diagnosis not present

## 2017-07-09 DIAGNOSIS — H3582 Retinal ischemia: Secondary | ICD-10-CM | POA: Diagnosis not present

## 2017-07-09 DIAGNOSIS — H34811 Central retinal vein occlusion, right eye, with macular edema: Secondary | ICD-10-CM | POA: Diagnosis not present

## 2017-08-25 ENCOUNTER — Other Ambulatory Visit: Payer: Self-pay | Admitting: Interventional Cardiology

## 2017-09-03 DIAGNOSIS — H34811 Central retinal vein occlusion, right eye, with macular edema: Secondary | ICD-10-CM | POA: Diagnosis not present

## 2017-09-03 DIAGNOSIS — H3582 Retinal ischemia: Secondary | ICD-10-CM | POA: Diagnosis not present

## 2017-09-03 DIAGNOSIS — H35032 Hypertensive retinopathy, left eye: Secondary | ICD-10-CM | POA: Diagnosis not present

## 2017-09-03 DIAGNOSIS — H43813 Vitreous degeneration, bilateral: Secondary | ICD-10-CM | POA: Diagnosis not present

## 2017-10-22 DIAGNOSIS — H35032 Hypertensive retinopathy, left eye: Secondary | ICD-10-CM | POA: Diagnosis not present

## 2017-10-22 DIAGNOSIS — H3582 Retinal ischemia: Secondary | ICD-10-CM | POA: Diagnosis not present

## 2017-10-22 DIAGNOSIS — E785 Hyperlipidemia, unspecified: Secondary | ICD-10-CM | POA: Diagnosis not present

## 2017-10-22 DIAGNOSIS — I714 Abdominal aortic aneurysm, without rupture: Secondary | ICD-10-CM | POA: Diagnosis not present

## 2017-10-22 DIAGNOSIS — H43813 Vitreous degeneration, bilateral: Secondary | ICD-10-CM | POA: Diagnosis not present

## 2017-10-22 DIAGNOSIS — H34811 Central retinal vein occlusion, right eye, with macular edema: Secondary | ICD-10-CM | POA: Diagnosis not present

## 2017-10-22 DIAGNOSIS — K219 Gastro-esophageal reflux disease without esophagitis: Secondary | ICD-10-CM | POA: Diagnosis not present

## 2017-10-22 DIAGNOSIS — I2510 Atherosclerotic heart disease of native coronary artery without angina pectoris: Secondary | ICD-10-CM | POA: Diagnosis not present

## 2017-10-22 DIAGNOSIS — Z0000 Encounter for general adult medical examination without abnormal findings: Secondary | ICD-10-CM | POA: Diagnosis not present

## 2017-12-12 ENCOUNTER — Other Ambulatory Visit: Payer: Self-pay | Admitting: Interventional Cardiology

## 2017-12-17 DIAGNOSIS — H43813 Vitreous degeneration, bilateral: Secondary | ICD-10-CM | POA: Diagnosis not present

## 2017-12-17 DIAGNOSIS — H3582 Retinal ischemia: Secondary | ICD-10-CM | POA: Diagnosis not present

## 2017-12-17 DIAGNOSIS — H35032 Hypertensive retinopathy, left eye: Secondary | ICD-10-CM | POA: Diagnosis not present

## 2017-12-17 DIAGNOSIS — H34811 Central retinal vein occlusion, right eye, with macular edema: Secondary | ICD-10-CM | POA: Diagnosis not present

## 2018-01-02 ENCOUNTER — Other Ambulatory Visit: Payer: Self-pay | Admitting: Interventional Cardiology

## 2018-01-02 NOTE — Telephone Encounter (Signed)
Eliquis 5mg  refill request received; pt is 77 yrs old, wt-86.4kg, Crea-1.11 on 04/20/17, last seen by [name] on 05/03/17; will send in refill to requested pharmacy.

## 2018-02-11 DIAGNOSIS — H35033 Hypertensive retinopathy, bilateral: Secondary | ICD-10-CM | POA: Diagnosis not present

## 2018-02-11 DIAGNOSIS — H3582 Retinal ischemia: Secondary | ICD-10-CM | POA: Diagnosis not present

## 2018-02-11 DIAGNOSIS — H43813 Vitreous degeneration, bilateral: Secondary | ICD-10-CM | POA: Diagnosis not present

## 2018-02-11 DIAGNOSIS — H34811 Central retinal vein occlusion, right eye, with macular edema: Secondary | ICD-10-CM | POA: Diagnosis not present

## 2018-03-22 ENCOUNTER — Other Ambulatory Visit: Payer: Self-pay | Admitting: Cardiothoracic Surgery

## 2018-03-22 DIAGNOSIS — I7101 Dissection of thoracic aorta: Secondary | ICD-10-CM

## 2018-03-22 DIAGNOSIS — I71019 Dissection of thoracic aorta, unspecified: Secondary | ICD-10-CM

## 2018-04-15 DIAGNOSIS — H35033 Hypertensive retinopathy, bilateral: Secondary | ICD-10-CM | POA: Diagnosis not present

## 2018-04-15 DIAGNOSIS — H3582 Retinal ischemia: Secondary | ICD-10-CM | POA: Diagnosis not present

## 2018-04-15 DIAGNOSIS — H43813 Vitreous degeneration, bilateral: Secondary | ICD-10-CM | POA: Diagnosis not present

## 2018-04-15 DIAGNOSIS — H34811 Central retinal vein occlusion, right eye, with macular edema: Secondary | ICD-10-CM | POA: Diagnosis not present

## 2018-04-16 DIAGNOSIS — Z23 Encounter for immunization: Secondary | ICD-10-CM | POA: Diagnosis not present

## 2018-04-25 ENCOUNTER — Encounter: Payer: Self-pay | Admitting: Interventional Cardiology

## 2018-05-01 NOTE — Progress Notes (Signed)
Cardiology Office Note   Date:  05/02/2018   ID:  [name] [name], DOB January 27, 1941, MRN [id number]  PCP:  [name], MD    No chief complaint on file.  CAD  Wt Readings from Last 3 Encounters:  05/02/18 189 lb (85.7 kg)  05/02/18 188 lb 6.4 oz (85.5 kg)  05/03/17 190 lb 6.4 oz (86.4 kg)       History of Present Illness: [name] [name] is a 77 y.o. male  with h/o aortic disection, CAD - DES to circumflex in 2011. 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 He was admitted to the hospital on 10/05/13 for urosepsis and was on the ventilator for 2 days. Also has thoracentesis for pleural effusion (1200cc).   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  He had a retinal vein thrombosis apparently, per the patient's report.  Since the last visit, he has done well.  Getting eye injections.    Denies : Chest pain. Dizziness. Leg edema. Nitroglycerin use. Orthopnea. Palpitations. Paroxysmal nocturnal dyspnea. Shortness of breath. Syncope.    HR at Dr. Tyrone Sage appt this morning was 107.  He is not having any palpitations.   Past Medical History:  Diagnosis Date  . Aortic valve insufficiency   . Arthritis    "probably in my knees before they replaced them" (01/28/2015)  . Atrial flutter (HCC)   . CAD (coronary artery disease)   . CHF (congestive heart failure) (HCC)   . Descending aortic aneurysm (HCC)   . DVT (deep venous thrombosis) (HCC)    ?LLE post knee surgery  . History of blood transfusion    "after one of my knee surgeries"  . HTN (hypertension)   . Hyperlipidemia   . Thoracic aneurysm   . Tobacco abuse     Past Surgical History:  Procedure Laterality Date  . AORTIC VALVE REPLACEMENT  02/19/2009  . AORTO-FEMORAL BYPASS GRAFT  03/2010   ascending aortic and arch aneurysm repair/notes 04/09/2009  . CARDIAC VALVE REPLACEMENT    .  CAROTID-SUBCLAVIAN BYPASS GRAFT Left 08/26/2013   Procedure: BYPASS GRAFT CAROTID-SUBCLAVIAN;  Surgeon: Nada Libman, MD;  [facility] OR;  Service: Vascular;  Laterality: Left;  . CATARACT EXTRACTION W/ INTRAOCULAR LENS  IMPLANT, BILATERAL Bilateral   . ENDOVASCULAR STENT INSERTION N/A 08/26/2013   Procedure:  THORACIC STENT GRAFT INSERTION;  Surgeon: Nada Libman, MD;  Location: [facility] OR;  Service: Vascular;  Laterality: N/A;  . JOINT REPLACEMENT    . KNEE ARTHROSCOPY Bilateral   . REPLACEMENT TOTAL KNEE Bilateral   . right groin lymphocele Right 03/29/2009  . THORACENTESIS  2010 X 2  . TONSILLECTOMY       Current Outpatient Medications  Medication Sig Dispense Refill  . amiodarone (PACERONE) 200 MG tablet TAKE 1 TABLET BY MOUTH ONCE DAILY 90 tablet 1  . atorvastatin (LIPITOR) 10 MG tablet TAKE 1 TABLET BY MOUTH ONCE DAILY 90 tablet 1  . ELIQUIS 5 MG TABS tablet TAKE 1 TABLET BY MOUTH TWICE DAILY 30 tablet 5  . furosemide (LASIX) 40 MG tablet TAKE 1 TABLET BY MOUTH TWICE A DAY 180 tablet 2  . loratadine (CLARITIN) 10 MG tablet Take 1 tablet (10 mg total) by mouth daily. 30 tablet 11  . metoprolol tartrate (LOPRESSOR) 25 MG tablet Take 0.5 tablets (12.5 mg total) at bedtime by mouth. 45 tablet 3  .  Multiple Vitamin (MULTIVITAMIN WITH MINERALS) TABS tablet Take 1 tablet by mouth daily.    . nitroGLYCERIN (NITROSTAT) 0.4 MG SL tablet Place 1 tablet (0.4 mg total) every 5 (five) minutes as needed under the tongue for chest pain (X3 DOSES BEFORE CALLING 911). 25 tablet 3  . potassium chloride SA (K-DUR,KLOR-CON) 20 MEQ tablet Take 2 tablets (40 mEq total) by mouth daily. 180 tablet 1   No current facility-administered medications for this visit.     Allergies:   Lortab [hydrocodone-acetaminophen] and Morphine and related    Social History:  The patient  reports that he quit smoking about 44 years ago. His smoking use included cigarettes. He smoked 0.00 packs per day. He has never used  smokeless tobacco. He reports that he does not drink alcohol or use drugs.   Family History:  The patient's family history includes Aortic aneurysm in his father; Celiac disease in his daughter; Hypertension in his mother.    ROS:  Please see the history of present illness.   Otherwise, review of systems are positive for vision problems.   All other systems are reviewed and negative.    PHYSICAL EXAM: VS:  BP 134/82   Pulse (!) 108   Ht 5\' 11"  (1.803 m)   Wt 189 lb (85.7 kg)   SpO2 98%   BMI 26.36 kg/m  , BMI Body mass index is 26.36 kg/m. GEN: Well nourished, well developed, in no acute distress  HEENT: normal  Neck: no JVD, carotid bruits, or masses Cardiac: tacycardic, S1 S2; no murmurs, rubs, or gallops,; 1+ bilateral LE edema  Respiratory:  clear to auscultation bilaterally, normal work of breathing GI: soft, nontender, nondistended, + BS MS: no deformity or atrophy  Skin: warm and dry, no rash Neuro:  Strength and sensation are intact Psych: euthymic mood, full affect   EKG:   The ekg ordered today demonstrates likely atrial tachycardia, wide QRS   Recent Labs: No results found for requested labs within last 8760 hours.   Lipid Panel    Component Value Date/Time   CHOL 152 10/10/2013 1220   TRIG 116 10/07/2013 0320   HDL 38 (L) 07/29/2010 0210   CHOLHDL 3.6 07/29/2010 0210   VLDL 18 07/29/2010 0210   LDLCALC  07/29/2010 0210    80        Total Cholesterol/HDL:CHD Risk Coronary Heart Disease Risk Table                     Men   Women  1/2 Average Risk   3.4   3.3  Average Risk       5.0   4.4  2 X Average Risk   9.6   7.1  3 X Average Risk  23.4   11.0        Use the calculated Patient Ratio above and the CHD Risk Table to determine the patient's CHD Risk.        ATP III CLASSIFICATION (LDL):  <100     mg/dL   Optimal  161-096  mg/dL   Near or Above                    Optimal  130-159  mg/dL   Borderline  045-409  mg/dL   High  >811     mg/dL    Very High     Other studies Reviewed: Additional studies/ records that were reviewed today with results demonstrating: 2015 echo 50-55%.  ASSESSMENT AND PLAN:  1. CAD/Old MI: No CHF.  Check labs today. 2. Aortic dissection: routine screening CT scans with Dr. Tyrone Sage. 3. Mitral regurgitation: mild at last echo.  No CHF. 4. AFib: Appears to be in atrial tach.  Increase metoprolol to 25 mg BID.  When HR decreases, can go back 12.5 BID.  Wife is a rad tech and comfortable checking his pulse.  5. Bradycardia:  Not an issue now.  It has been in the past, therefore, decrease back to 12.5 mg BID after HR slows. 6. Anticoagulated: Check labs for eliquis.   Current medicines are reviewed at length with the patient today.  The patient concerns regarding his medicines were addressed.  The following changes have been made:  Increase metoprolol  Labs/ tests ordered today include:  No orders of the defined types were placed in this encounter.   Recommend 150 minutes/week of aerobic exercise Low fat, low carb, high fiber diet recommended  Disposition:   FU in 1 year, or sooner if HR continues to be high, or slow HR are noted   Signed, Lance Muss, MD  05/02/2018 1:40 PM    [facility] [address] [place], [place], [place]  [address] Phone: [phone]; Fax: [phone]

## 2018-05-02 ENCOUNTER — Ambulatory Visit
Admission: RE | Admit: 2018-05-02 | Discharge: 2018-05-02 | Disposition: A | Discharge status: Home / Self Care | Payer: Medicare Other | Source: Ambulatory Visit | Attending: Cardiothoracic Surgery | Admitting: Cardiothoracic Surgery

## 2018-05-02 ENCOUNTER — Ambulatory Visit (INDEPENDENT_AMBULATORY_CARE_PROVIDER_SITE_OTHER): Payer: Medicare Other | Admitting: Cardiothoracic Surgery

## 2018-05-02 ENCOUNTER — Encounter: Payer: Self-pay | Admitting: Interventional Cardiology

## 2018-05-02 ENCOUNTER — Other Ambulatory Visit: Payer: Self-pay

## 2018-05-02 ENCOUNTER — Ambulatory Visit (INDEPENDENT_AMBULATORY_CARE_PROVIDER_SITE_OTHER): Payer: Medicare Other | Admitting: Interventional Cardiology

## 2018-05-02 VITALS — BP 134/82 | HR 108 | Ht 71.0 in | Wt 189.0 lb

## 2018-05-02 VITALS — BP 140/88 | HR 107 | Resp 18 | Ht 71.0 in | Wt 188.4 lb

## 2018-05-02 DIAGNOSIS — Z952 Presence of prosthetic heart valve: Secondary | ICD-10-CM | POA: Diagnosis not present

## 2018-05-02 DIAGNOSIS — I71019 Dissection of thoracic aorta, unspecified: Secondary | ICD-10-CM

## 2018-05-02 DIAGNOSIS — Z7901 Long term (current) use of anticoagulants: Secondary | ICD-10-CM | POA: Diagnosis not present

## 2018-05-02 DIAGNOSIS — I7101 Dissection of thoracic aorta: Secondary | ICD-10-CM

## 2018-05-02 DIAGNOSIS — I480 Paroxysmal atrial fibrillation: Secondary | ICD-10-CM

## 2018-05-02 DIAGNOSIS — I340 Nonrheumatic mitral (valve) insufficiency: Secondary | ICD-10-CM

## 2018-05-02 DIAGNOSIS — Z954 Presence of other heart-valve replacement: Secondary | ICD-10-CM | POA: Diagnosis not present

## 2018-05-02 DIAGNOSIS — I2510 Atherosclerotic heart disease of native coronary artery without angina pectoris: Secondary | ICD-10-CM

## 2018-05-02 MED ORDER — IOPAMIDOL (ISOVUE-370) INJECTION 76%
75.0000 mL | Freq: Once | INTRAVENOUS | Status: AC | PRN
  Administered 2018-05-02: 75 mL via INTRAVENOUS

## 2018-05-02 MED ORDER — METOPROLOL TARTRATE 25 MG PO TABS: 25.0000 mg | ORAL_TABLET | Freq: Two times a day (BID) | ORAL | 3 refills | Status: DC

## 2018-05-02 NOTE — Patient Instructions (Signed)
Medication Instructions:  Your physician has recommended you make the following change in your medication:   INCREASE: metoprolol tartrate (lopressor) to 25 mg by mouth twice a day   If you need a refill on your cardiac medications before your next appointment, please call your pharmacy.   Lab work: TODAY: CBC, CMET, TSH  If you have labs (blood work) drawn today and your tests are completely normal, you will receive your results only by: Marland Kitchen MyChart Message (if you have MyChart) OR . A paper copy in the mail If you have any lab test that is abnormal or we need to change your treatment, we will call you to review the results.  Testing/Procedures: None ordered  Follow-Up: At [facility], you and your health needs are our priority.  As part of our continuing mission to provide you with exceptional heart care, we have created designated Provider Care Teams.  These Care Teams include your primary Cardiologist (physician) and Advanced Practice Providers (APPs -  Physician Assistants and Nurse Practitioners) who all work together to provide you with the care you need, when you need it. . You will need a follow up appointment in 1 year.  Please call our office 2 months in advance to schedule this appointment.  You may see Everette Rank, MD or one of the following Advanced Practice Providers on your designated Care Team:   . Robbie Lis, PA-C . Dayna Dunn, PA-C . Jacolyn Reedy, PA-C  Any Other Special Instructions Will Be Listed Below (If Applicable).

## 2018-05-02 NOTE — Progress Notes (Signed)
Patient ID: [name] [name], male   DOB: 21-Dec-1940, 77 y.o.   MRN: [id number]          [facility] [place] Medical Record #[id number] Date of Birth: 21-Apr-1941  Referring: [name], MD Primary Care: [name], MD  Chief Complaint:    Chief Complaint  Patient presents with  . 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  SURGICAL PROCEDURE: Left subclavian to carotid bypass with 8 mm graft  and ligation of proximal left subclavian artery. Endovascular repair of  descending thoracic aorta with coverage of the left subclavian and  distal extension with a proximal 40 x 40 x 20 GORE-TEX device with a  second extension 45 x 45 x 15 cm and aortogram x3.  2010- aortic valve replacement replacement of aortic root and ascending aorta and replacement of aortic arch to the left subclavian artery.  History of Present Illness:     Patient is a 77 year old male who on 02/19/2009 presented In respiratory distress on a ventilator with a large ascending aneurysm and acute aortic insufficiency and aortic arch aneurysm and at that time underwent aortic valve replacement replacement of aortic root and ascending aorta and replacement of aortic arch to the left subclavian artery. Since that time he's had an acute myocardial infarction and had a stent placed. In November 2014 prior to his yearly followup [facility] with an acute type III aortic dissection. He was treated medically, there is hospital stay he did develop persistent atrial fibrillation and ultimately was discharged home on anticoagulation.    August 28 2013  he underwent left subclavian to  carotid bypass ligation of the proximal left subclavian artery an endovascular repair of descending thoracic aorta with coverage of the left subclavian artery. \  The patient returns today with a follow-up CT scan of the chest and abdomen.  Since last seen he has had a right eye blindness.  He has become more unstable on his feet.  His wife does note that he gets out and walks the dogs occasionally.  He denies chest pain.     Current Activity/ Functional Status: Mobility/Ambulation: Independent with mobility prior to admission in the home on all surfaces with no assistive devices for unlimited distances.   Zubrod Score: At the time of surgery this patient's most appropriate activity status/level should be described as: []  Normal activity, no symptoms []  Symptoms, fully ambulatory [x]  Symptoms, in bed less than or equal to 50% of the time []  Symptoms, in bed greater than 50% of the time but less than 100% []  Bedridden []  Moribund   Past Medical History:  Diagnosis Date  . Aortic valve insufficiency   . Arthritis    "probably in my knees before they replaced them" (01/28/2015)  . Atrial flutter (HCC)   . CAD (coronary artery disease)   . CHF (congestive heart failure) (HCC)   . Descending aortic aneurysm (HCC)   . DVT (deep venous thrombosis) (HCC)    ?LLE post knee surgery  . History of blood transfusion    "after one of my knee surgeries"  . HTN (hypertension)   . Hyperlipidemia   . Thoracic aneurysm   . Tobacco abuse  Past Surgical History:  Procedure Laterality Date  . AORTIC VALVE REPLACEMENT  02/19/2009  . AORTO-FEMORAL BYPASS GRAFT  03/2010   ascending aortic and arch aneurysm repair/notes 04/09/2009  . CARDIAC VALVE REPLACEMENT    . CAROTID-SUBCLAVIAN BYPASS GRAFT Left 08/26/2013   Procedure: BYPASS GRAFT CAROTID-SUBCLAVIAN;  Surgeon: Nada Libman, MD;  Location: [facility] OR;  Service: Vascular;  Laterality: Left;  . CATARACT EXTRACTION W/ INTRAOCULAR LENS  IMPLANT,  BILATERAL Bilateral   . ENDOVASCULAR STENT INSERTION N/A 08/26/2013   Procedure:  THORACIC STENT GRAFT INSERTION;  Surgeon: Nada Libman, MD;  Location: [facility] OR;  Service: Vascular;  Laterality: N/A;  . JOINT REPLACEMENT    . KNEE ARTHROSCOPY Bilateral   . REPLACEMENT TOTAL KNEE Bilateral   . right groin lymphocele Right 03/29/2009  . THORACENTESIS  2010 X 2  . TONSILLECTOMY      Social History   Tobacco Use  Smoking Status Former Smoker  . Packs/day: 0.00  . Types: Cigarettes  . Last attempt to quit: 04/16/1974  . Years since quitting: 44.0  Smokeless Tobacco Never Used    Social History   Substance and Sexual Activity  Alcohol Use No      Allergies  Allergen Reactions  . Lortab [Hydrocodone-Acetaminophen] Hives  . Morphine And Related Hives    Current Outpatient Medications  Medication Sig Dispense Refill  . amiodarone (PACERONE) 200 MG tablet TAKE 1 TABLET BY MOUTH ONCE DAILY 90 tablet 1  . atorvastatin (LIPITOR) 10 MG tablet TAKE 1 TABLET BY MOUTH ONCE DAILY 90 tablet 1  . ELIQUIS 5 MG TABS tablet TAKE 1 TABLET BY MOUTH TWICE DAILY 30 tablet 5  . furosemide (LASIX) 40 MG tablet TAKE 1 TABLET BY MOUTH TWICE A DAY 180 tablet 2  . loratadine (CLARITIN) 10 MG tablet Take 1 tablet (10 mg total) by mouth daily. 30 tablet 11  . metoprolol tartrate (LOPRESSOR) 25 MG tablet Take 0.5 tablets (12.5 mg total) at bedtime by mouth. 45 tablet 3  . Multiple Vitamin (MULTIVITAMIN WITH MINERALS) TABS tablet Take 1 tablet by mouth daily.    . nitroGLYCERIN (NITROSTAT) 0.4 MG SL tablet Place 1 tablet (0.4 mg total) every 5 (five) minutes as needed under the tongue for chest pain (X3 DOSES BEFORE CALLING 911). 25 tablet 3  . potassium chloride SA (K-DUR,KLOR-CON) 20 MEQ tablet Take 2 tablets (40 mEq total) by mouth daily. 180 tablet 1   No current facility-administered medications for this visit.       Physical Exam: BP 140/88 (BP Location: Right Arm, Patient Position: Sitting,  Cuff Size: Normal)   Pulse (!) 107   Resp 18   Ht 5\' 11"  (1.803 m)   Wt 188 lb 6.4 oz (85.5 kg)   SpO2 98% Comment: RA  BMI 26.28 kg/m   General appearance: alert, cooperative, appears older than stated age and no distress Head: Normocephalic, without obvious abnormality, atraumatic Neck: no adenopathy, no carotid bruit, no JVD, supple, symmetrical, trachea midline and thyroid not enlarged, symmetric, no tenderness/mass/nodules Lymph nodes: Cervical, supraclavicular, and axillary nodes normal. Resp: clear to auscultation bilaterally Back: symmetric, no curvature. ROM normal. No CVA tenderness. Cardio: regular rate and rhythm, S1, S2 normal, no murmur, click, rub or gallop GI: soft, non-tender; bowel sounds normal; no masses,  no organomegaly Extremities: extremities normal, atraumatic, no cyanosis or edema and Homans sign is negative, no sign of DVT Neurologic: Grossly normal Patient has palpable DP and PT pulses bilaterally  Diagnostic Studies & Laboratory data:  Recent Radiology Findings:  Ct Angio Chest Aorta W/cm &/or Wo/cm  Result Date: 05/02/2018 CLINICAL DATA:  Known hx of dissection , aortic valve replacement , s/p dissection, f/u exam 2-3 years Cancer-none Prev in pacs^ EXAM: CT ANGIOGRAPHY CHEST WITH CONTRAST TECHNIQUE: Multidetector CT imaging of the chest was performed using the standard protocol during bolus administration of intravenous contrast. Multiplanar CT image reconstructions and MIPs were obtained to evaluate the vascular anatomy. CONTRAST:  75mL ISOVUE-370 IOPAMIDOL (ISOVUE-370) INJECTION 76% Creatinine was obtained on site at [facility] at [address] Results: Creatinine 1.0 mg/dL.  GFR 72 COMPARISON:  10/19/2016 and previous FINDINGS: Cardiovascular: Biatrial cardiac enlargement. No pericardial effusion. Incomplete opacification of the pulmonary arterial tree; the exam was not optimized for detection of pulmonary emboli. Mitral annulus  calcifications. Moderate coronary arterial calcifications. Previous AVR. Stable ascending aortic tube graft repair. Common origin of the right brachiocephalic and left common carotid arteries, with mild stenosis just beyond the right brachiocephalic origin. Stent graft in the distal arch through the proximal and mid descending thoracic aorta. Proximal tines partially overlay the common brachiocephalic origin. There is origin occlusion/thrombosis of the proximal left common carotid artery, with patent carotid subclavian graft. Good apposition of distal stent graft tines. Aneurysmal dilatation of the distal descending thoracic aorta up to 4.3 cm diameter. Fusiform dilatation of the proximal abdominal aorta measuring 3.9 cm diameter at the level of the celiac axis origin, 3.3 cm the visualized infrarenal segment, incompletely visualized distally. Mediastinum/Nodes: No hilar or mediastinal adenopathy. Lungs/Pleura: Trace left pleural effusion. No pneumothorax. Pulmonary emphysematous changes most marked in the apices. Upper Abdomen: 3 cm exophytic lesion from the upper pole right kidney, stable since 08/22/2013. Partially calcified subcentimeter stones in the dependent aspect of the nondilated gallbladder. No acute findings. Musculoskeletal: Previous median sternotomy. Spondylitic changes in the visualized lower cervical spine. Mild thoracolumbar dextroscoliosis. No fracture or worrisome bone lesion. Review of the MIP images confirms the above findings. IMPRESSION: 1. Stable thoracic aortic stent graft and ascending aortic graft repair with AVR. 2. Fusiform aneurysmal dilatation of the native distal descending thoracic aorta up to 4.3 cm, infrarenal aorta at least 3.3 cm incompletely visualized. 3. Coronary calcifications. The severity of coronary artery disease and any potential stenosis cannot be assessed on this non-gated CT examination. Assessment for potential risk factor modification, dietary therapy or  pharmacologic therapy may be warranted, if clinically indicated. 4. Cholelithiasis Electronically Signed   By: Corlis Leak M.D.   On: 05/02/2018 09:52    Ct Angio Chest Aorta W &/or Wo Contrast  Result Date: 10/19/2016 CLINICAL DATA:  Thoracic aortic aneurysm, post stent. History of dissection. EXAM: CT ANGIOGRAPHY CHEST WITH CONTRAST TECHNIQUE: Multidetector CT imaging of the chest was performed using the standard protocol during bolus administration of intravenous contrast. Multiplanar CT image reconstructions and MIPs were obtained to evaluate the vascular anatomy. CONTRAST:  75 cc Isovue 370 IV COMPARISON:  09/23/2015 FINDINGS: Cardiovascular: Patient is status post stent graft in the aortic arch and descending thoracic aorta. Maximum diameter in the distal descending thoracic aorta is 4.5 cm, stable. No dissection. No evidence of endoleak. Prior aortic valve repair she. Prior tube graft repair of ascending thoracic aorta with graft to great vessels. 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 Cardiomegaly noted. Extensive coronary artery calcifications. Mediastinum/Nodes: No mediastinal, hilar, or axillary adenopathy. Lungs/Pleura: Linear areas of scarring in the lung bases. No confluent  opacities or effusions. Upper Abdomen: Imaging into the upper abdomen shows no acute findings. Gallstones layering in the gallbladder. Musculoskeletal: Chest wall soft tissues are unremarkable. No acute bony abnormality. Review of the MIP images confirms the above findings. IMPRESSION: Status post tube graft repair of the ascending thoracic aorta and stent graft repair of the aortic arch and descending thoracic aorta. Stable size and appearance. No dissection or endoleak. No acute cardiopulmonary disease. Cholelithiasis. Electronically Signed   By: Charlett Nose M.D.   On: 10/19/2016 11:53   Ct Angio Chest Aorta W/cm &/or  Wo/cm  09/23/2015  CLINICAL DATA:  F/u s/p stent for TAA No injury No hx cancer Ex smoker x 30 years EXAM: CT ANGIOGRAPHY CHEST WITH CONTRAST TECHNIQUE: Multidetector CT imaging of the chest was performed using the standard protocol during bolus administration of intravenous contrast. Multiplanar CT image reconstructions and MIPs were obtained to evaluate the vascular anatomy. CONTRAST:  80 mL Isovue 370 IV COMPARISON:  01/28/2015 FINDINGS: Vascular: Right arm IV contrast injection. SVC patent. Four-chamber cardiac enlargement. Mildly dilated central pulmonary arteries. Satisfactory opacification of pulmonary arteries noted, and there is no evidence of pulmonary emboli. Patent bilateral pulmonary veins drain into the left atrium. Scattered coronary calcifications. Previous AVR. Previous tube graft repair of the ascending aorta with graft to great vessels. There is thrombosis of the branch to the left subclavian artery. Patent left carotid to subclavian arterial bypass graft. Stent graft extends through the aortic arch to the distal descending thoracic aorta. Proximal and distal tines appear well apposed. No endoleak. No dissection. Maximum aortic diameter 5.2 cm in the mid descending, previously 5.4 cm at the same level on previous examination by my measurement. The native distal descending thoracic aorta is dilated up to 3.9 cm diameter with moderate calcified plaque in the wall. The proximal abdominal aorta tapers to a diameter 3 cm at the level of the SMA, not visualized distally. Mediastinum/Lymph Nodes: No masses or pathologically enlarged lymph nodes identified. No pericardial effusion. Lungs/Pleura: 6 mm subpleural nodule in the anterior segment right upper lobe, previously 7.5 mm. Lungs otherwise clear. No pleural effusion. No pneumothorax. Mild emphysematous changes in the lung apices. Upper abdomen: At least 2 subcentimeter partially calcified stones in the dependent aspect of the gallbladder, nondilated.  Musculoskeletal: Minimal spurring in the mid and lower thoracic spine. Previous median sternotomy. Review of the MIP images confirms the above findings. IMPRESSION: 1. 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 2. Stable postop changes of AVR, ascending aortic repair, graft to brachiocephalic vessels, and left carotid subclavian bypass. 3. Cholelithiasis. Electronically Signed   By: Corlis Leak M.D.   On: 09/23/2015 12:30   ICt Angio Chest Aorta W/cm &/or Wo/cm  10/15/2014   CLINICAL DATA:  Thoracic aortic stent graft  EXAM: CT ANGIOGRAPHY CHEST WITH CONTRAST  TECHNIQUE: Multidetector CT imaging of the chest was performed using the standard protocol during bolus administration of intravenous contrast. Multiplanar CT image reconstructions and MIPs were obtained to evaluate the vascular anatomy.  CONTRAST:  75 cc Isovue 370  COMPARISON:  02/12/2014  FINDINGS: Chest:  Thoracic aortic stent graft is stable in position. It begins just distal to the takeoff of the innominate artery with a bovine configuration. There is no evidence of endoleak. Maximal aneurysm sac diameter on image 43 of series 5 measures 5.3 cm. This is improved compared  to my direct measurements on the prior study. At that time, I measure a diameter of 5.6 cm. There is calcification between the innominate artery and aortic arch on image 31 of series 5.  There is persistent mild narrowing at the origin of the innominate artery just above the takeoff of the left common carotid artery. 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 This is unchanged. Right subclavian and common carotid arteries are patent. Origin of the right vertebral artery fills with contrast but then is non-opacified. Left common carotid artery is patent. Proximal left subclavian artery has been ligated. Left carotid to left  subclavian bypass graft is patent. Left vertebral artery is dominant and patent. Left subclavian artery is patent.  Aortic valve replacement is visualized. The aortic root and ascending aorta are have a normal appearance without evidence of dissection. Maximal diameters 3.3 cm.  No obvious acute pulmonary thromboembolism.  Stable 5 mm right upper lobe pulmonary nodule on image 57 of series 602. Minimal dependent atelectasis  No pneumothorax or pleural effusion.  Cholelithiasis  Review of the MIP images confirms the above findings.  IMPRESSION: Stable position of the thoracic aortic stent graft. There is no evidence of endoleak and aneurysm sac diameter has diminished from 5.6 cm to 5.3 cm.  There is poor filling of the right vertebral artery. Early subclavian steal syndrome cannot be excluded in this patient suggesting significant narrowing of the right subclavian artery circuit. This could conceivably occur at its origin at the level of the stent graft. Carotid Doppler study with evaluation of vertebral artery flow was recommended. There is no obvious area of narrowing on this study.   Electronically Signed   By: Jolaine Click M.D.   On: 10/15/2014 14:39    Ct Angio Chest Aorta W/cm &/or Wo/cm  02/12/2014   CLINICAL DATA:  Evaluate aortic dissection. Followup thoracic aortic aneurysm. Previous aortic valve replacement with thoracic aortic aneurysm stent graft.  EXAM: CT ANGIOGRAPHY CHEST AND ABDOMEN  TECHNIQUE: Multidetector CT imaging of the chest and abdomen was performed using the standard protocol during bolus administration of intravenous contrast. Multiplanar CT image reconstructions and MIPs were obtained to evaluate the vascular anatomy.  CONTRAST:  80mL OMNIPAQUE IOHEXOL 350 MG/ML SOLN  COMPARISON:  10/06/2013, 08/22/2013 as well as 05/18/2011  FINDINGS: CTA CHEST FINDINGS  Sternotomy wires are present. The lungs are adequately inflated without consolidation or effusion. There is very minimal apical  emphysematous disease. There is a 7-8 mm nodule over the anterior right upper lobe stable since 2012. There is moderate stable cardiomegaly. No change and coronary artery calcifications with suggestion of stent over the lateral circumflex artery. Evidence of previous aortic valve replacement. No pericardial or mediastinal fluid collection. No mediastinal or hilar adenopathy.  The posterior arch/descending thoracic aortic stent graft is unchanged which excludes the previously noted aneurysm/dissection. No current evidence of a dissection. Significantly less the low density thrombus in the native aneurysm sac. No evidence of endoleak. 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  Review of the MIP images confirms the above findings.  CTA ABDOMEN FINDINGS  Abdominal images demonstrate minimal cholelithiasis. The liver, spleen, pancreas and adrenal glands are within normal. Kidneys are normal in size without hydronephrosis or nephrolithiasis. No change in a 3.4 cm cyst over the upper pole right kidney. Few other smaller subcentimeter renal cortical hypodensities likely cysts but too small to characterize. There is diverticulosis of  the colon. Appendix is within normal. Visualize ureters are within normal.  There is mild calcified plaque over the abdominal aorta. There is no evidence of abdominal aortic dissection. There is mild aneurysmal dilatation of the infrarenal abdominal aorta measuring 3.0 cm in AP diameter just below the take-off of the renal arteries. The celiac axis, superior and inferior mesenteric arteries are patent. Single bilateral renal arteries are patent with mild focal narrowing at the origin of the right renal artery. There are degenerative changes of the spine.  Review of the MIP images confirms the above findings.  IMPRESSION: Stable aortic stent graft over the aortic arch and descending thoracic aorta with  significant reduction in the mural thrombus of the native aneurysm sac. No evidence of dissection or endoleak.  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 No evidence of abdominal aortic dissection.  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  Stable moderate cardiomegaly with atherosclerotic coronary artery disease and left lateral circumflex stent. Previous median sternotomy and aortic valve replacement.  Mild cholelithiasis.  Bilateral stable renal cysts with the largest measuring 3.4 cm over the upper pole right kidney.  Diverticulosis of the colon.   Electronically Signed   By: Elberta Fortis M.D.   On: 02/12/2014 10:02    Ct Angio Head W/cm &/or Wo Cm  08/22/2013   CLINICAL DATA:  77 year old male with history of thoracic aneurysm status post repair. Progressive weight gain, swelling and shortness of Breath. Recently thought to have sinus infection, treated with antibiotics and steroids. Symptoms increasing. Initial encounter.  EXAM: CT ANGIOGRAPHY HEAD AND NECK  TECHNIQUE: Multidetector CT imaging of the head and neck was performed using the standard protocol during bolus administration of intravenous contrast. Multiplanar CT image reconstructions and MIPs were obtained to evaluate the vascular anatomy. Carotid stenosis measurements (when applicable) are obtained utilizing NASCET criteria, using the distal internal carotid diameter as the denominator.  CONTRAST:  50mL OMNIPAQUE IOHEXOL 350 MG/ML SOLN  COMPARISON:  Chest abdomen and pelvis CTA from the same day reported separately.  FINDINGS: CTA HEAD FINDINGS  Calvarium intact. Negative scalp soft tissues. Cerebral volume is within normal limits for age. No midline shift, ventriculomegaly, mass effect, evidence of mass lesion, intracranial hemorrhage or evidence of cortically based acute infarction. Gray-white matter differentiation is within normal limits throughout the  brain. No abnormal enhancement identified.  VASCULAR FINDINGS:  Major intracranial venous structures are enhancing.  Dominant distal left vertebral artery is patent without stenosis despite some calcified plaque. Retrograde supply to the non dominant distal right vertebral artery. Right PICA and AICA are patent. Dominant left AICA. The basilar artery is patent without stenosis. SCA and PCA origins are within normal limits. Normal left posterior communicating artery, the right is diminutive or absent. Bilateral PCA branches are within normal limits.  Some cavernous sinus venous contrast artifact is present. Both ICA siphons are patent. Ophthalmic and posterior communicating arteries are within normal limits. Normal carotid termini. Normal MCA and ACA origins. Anterior communicating artery and bilateral ACA branches are within normal limits. Bilateral MCA branches are within normal limits.  Review of the MIP images confirms the above findings.  CTA NECK FINDINGS  Chest findings are reported separately (please see that report). Negative thyroid, larynx, pharynx, parapharyngeal spaces, retropharyngeal space. Extensive dental hardware artifact. Negative visualized sublingual space, submandibular glands and submandibular space. Negative parotid glands. No acute orbits soft tissue findings.  No cervical lymphadenopathy. Mucous retention cyst left maxillary sinus.  Other Visualized paranasal sinuses and mastoids are clear. No acute osseous abnormality identified. Degenerative changes in the cervical spine.  VASCULAR FINDINGS:  SEE CHEST CTA FINDINGS FROM TODAY REPORTED SEPARATELY.  Both internal jugular veins are patent. The left subclavian and innominate vein are patent. Negative non contrast appearance of the right subclavian. The right innominate vein is patent.  Great vessel origins are patent, dolichoectatic. Widespread mild soft and calcified circumferential plaque in the great vessels.  Patent, mildly dolichoectatic  right CCA. Patent right carotid bifurcation with no right ICA stenosis despite calcified plaque. Tortuous, mildly dolichoectatic cervical right ICA.  No proximal right subclavian artery stenosis, with some dolichoectasia. Non dominant right vertebral artery is occluded at its origin. Distal right vertebral artery flow is reconstituted briefly at the C2-C3 level, but then re- occludes below the skullbase. Intracranial findings detailed above.  Patent, the dolichoectatic left CCA. Calcified plaque at the left carotid bifurcation which is significantly affected by dental streak artifact. Loss of enhancement in the left ICA origin and bulb felt to be artifactual (see series 12, image 172). Tortuous/dolichoectatic but otherwise negative cervical left ICA be on that level.  No proximal left subclavian artery stenosis. Dominant left vertebral artery origin widely patent. Tortuous proximal left vertebral artery. Dominant left vertebral artery tortuous and patent to the skullbase.  Review of the MIP images confirms the above findings.  IMPRESSION: 1. Abnormal thoracic aorta, see chest CTA from today reported separately. 2. Patent, dolichoectatic great vessels without arterial dissection in the neck. 3. Occluded non dominant right vertebral artery. Patent, dominant left vertebral artery. 4. Suspect artifactual decreased enhancement of the left ICA origin, related to severe dental hardware artifact. Absence of any proximal left ICA stenosis could be confirmed with either carotid Doppler or neck MRA if necessary. 5. Distal right vertebral artery supplied in a retrograde fashion from the left. Intracranial atherosclerosis, but no intracranial stenosis and otherwise negative intracranial CTA. 6.  No acute intracranial abnormality.   Electronically Signed   By: Augusto Gamble M.D.   On: 08/22/2013 12:26       Recent Lab Findings: Lab Results  Component Value Date   WBC 4.6 01/29/2015   HGB 9.8 (L) 01/29/2015   HCT 32.3 (L)  01/29/2015   PLT 208 01/29/2015   GLUCOSE 95 01/29/2015   CHOL 152 10/10/2013   TRIG 116 10/07/2013   HDL 38 (L) 07/29/2010   LDLCALC  07/29/2010    80        Total Cholesterol/HDL:CHD Risk Coronary Heart Disease Risk Table                     Men   Women  1/2 Average Risk   3.4   3.3  Average Risk       5.0   4.4  2 X Average Risk   9.6   7.1  3 X Average Risk  23.4   11.0        Use the calculated Patient Ratio above and the CHD Risk Table to determine the patient's CHD Risk.        ATP III CLASSIFICATION (LDL):  <100     mg/dL   Optimal  161-096  mg/dL   Near or Above                    Optimal  130-159  mg/dL   Borderline  045-409  mg/dL   High  >811     mg/dL  Very High   ALT 17 01/29/2015   AST 24 01/29/2015   NA 142 01/29/2015   K 3.5 01/29/2015   CL 105 01/29/2015   CREATININE 0.97 01/29/2015   BUN 16 01/29/2015   CO2 27 01/29/2015   TSH 1.102 05/08/2013   INR 1.42 01/28/2015   HGBA1C 6.1 (H) 08/27/2013      Assessment / Plan:   Patient status post left subclavian carotid bypass,  ligation of subclavian artery and endovascular repair of a chronic type III aortic dissection , with previous biologic Bentall and arch replacement .  On CT PA of the chest this appears stable now almost 10 years from his original surgery and 5 years from his thoracic stent graft.   We will plan on follow-up CTA of the chest in 18 months  Delight Ovens MD 05/02/2018 10:17 AM

## 2018-05-03 LAB — COMPREHENSIVE METABOLIC PANEL
ALT: 29 IU/L (ref 0–44)
AST: 33 IU/L (ref 0–40)
Albumin/Globulin Ratio: 1.7 (ref 1.2–2.2)
Albumin: 4.4 g/dL (ref 3.5–4.8)
Alkaline Phosphatase: 62 IU/L (ref 39–117)
BUN/Creatinine Ratio: 23 (ref 10–24)
BUN: 22 mg/dL (ref 8–27)
Bilirubin Total: 0.4 mg/dL (ref 0.0–1.2)
CO2: 22 mmol/L (ref 20–29)
Calcium: 9.2 mg/dL (ref 8.6–10.2)
Chloride: 105 mmol/L (ref 96–106)
Creatinine, Ser: 0.97 mg/dL (ref 0.76–1.27)
GFR calc Af Amer: 86 mL/min/{1.73_m2} (ref >59)
GFR calc non Af Amer: 74 mL/min/{1.73_m2} (ref >59)
Globulin, Total: 2.6 g/dL (ref 1.5–4.5)
Glucose: 104 mg/dL — ABNORMAL HIGH (ref 65–99)
Potassium: 4.3 mmol/L (ref 3.5–5.2)
Sodium: 145 mmol/L — ABNORMAL HIGH (ref 134–144)
Total Protein: 7 g/dL (ref 6.0–8.5)

## 2018-05-03 LAB — CBC
Hematocrit: 33.6 % — ABNORMAL LOW (ref 37.5–51.0)
Hemoglobin: 10.6 g/dL — ABNORMAL LOW (ref 13.0–17.7)
MCH: 27.9 pg (ref 26.6–33.0)
MCHC: 31.5 g/dL (ref 31.5–35.7)
MCV: 88 fL (ref 79–97)
Platelets: 255 10*3/uL (ref 150–450)
RBC: 3.8 x10E6/uL — ABNORMAL LOW (ref 4.14–5.80)
RDW: 15.7 % — ABNORMAL HIGH (ref 12.3–15.4)
WBC: 7.5 10*3/uL (ref 3.4–10.8)

## 2018-05-03 LAB — TSH: TSH: 2.14 u[IU]/mL (ref 0.450–4.500)

## 2018-05-10 ENCOUNTER — Ambulatory Visit: Payer: Medicare Other | Admitting: Interventional Cardiology

## 2018-06-05 ENCOUNTER — Other Ambulatory Visit: Payer: Self-pay | Admitting: Interventional Cardiology

## 2018-06-05 DIAGNOSIS — J069 Acute upper respiratory infection, unspecified: Secondary | ICD-10-CM | POA: Diagnosis not present

## 2018-06-05 NOTE — Telephone Encounter (Signed)
[name] medication was sent to pt's pharmacy as requested. Confirmation received.  °

## 2018-06-17 ENCOUNTER — Other Ambulatory Visit: Payer: Self-pay | Admitting: Interventional Cardiology

## 2018-06-24 DIAGNOSIS — H34811 Central retinal vein occlusion, right eye, with macular edema: Secondary | ICD-10-CM | POA: Diagnosis not present

## 2018-06-24 DIAGNOSIS — H3582 Retinal ischemia: Secondary | ICD-10-CM | POA: Diagnosis not present

## 2018-06-24 DIAGNOSIS — H43813 Vitreous degeneration, bilateral: Secondary | ICD-10-CM | POA: Diagnosis not present

## 2018-06-24 DIAGNOSIS — H35032 Hypertensive retinopathy, left eye: Secondary | ICD-10-CM | POA: Diagnosis not present

## 2018-09-02 DIAGNOSIS — H35033 Hypertensive retinopathy, bilateral: Secondary | ICD-10-CM | POA: Diagnosis not present

## 2018-09-02 DIAGNOSIS — H3582 Retinal ischemia: Secondary | ICD-10-CM | POA: Diagnosis not present

## 2018-09-02 DIAGNOSIS — H43813 Vitreous degeneration, bilateral: Secondary | ICD-10-CM | POA: Diagnosis not present

## 2018-09-02 DIAGNOSIS — H34811 Central retinal vein occlusion, right eye, with macular edema: Secondary | ICD-10-CM | POA: Diagnosis not present

## 2018-09-24 ENCOUNTER — Other Ambulatory Visit: Payer: Self-pay | Admitting: Interventional Cardiology

## 2018-11-19 DIAGNOSIS — I4891 Unspecified atrial fibrillation: Secondary | ICD-10-CM | POA: Diagnosis not present

## 2018-11-19 DIAGNOSIS — E785 Hyperlipidemia, unspecified: Secondary | ICD-10-CM | POA: Diagnosis not present

## 2018-11-19 DIAGNOSIS — K219 Gastro-esophageal reflux disease without esophagitis: Secondary | ICD-10-CM | POA: Diagnosis not present

## 2018-11-19 DIAGNOSIS — Z0000 Encounter for general adult medical examination without abnormal findings: Secondary | ICD-10-CM | POA: Diagnosis not present

## 2018-11-19 DIAGNOSIS — I2510 Atherosclerotic heart disease of native coronary artery without angina pectoris: Secondary | ICD-10-CM | POA: Diagnosis not present

## 2018-11-25 DIAGNOSIS — H43812 Vitreous degeneration, left eye: Secondary | ICD-10-CM | POA: Diagnosis not present

## 2018-11-25 DIAGNOSIS — H34811 Central retinal vein occlusion, right eye, with macular edema: Secondary | ICD-10-CM | POA: Diagnosis not present

## 2019-01-03 ENCOUNTER — Other Ambulatory Visit: Payer: Self-pay

## 2019-01-03 MED ORDER — APIXABAN 5 MG PO TABS: 5.0000 mg | ORAL_TABLET | Freq: Two times a day (BID) | ORAL | 1 refills | Status: DC

## 2019-01-03 NOTE — Telephone Encounter (Signed)
Refill request received via fax from [name] in [place]. Pt last saw Dr Irish Lack 05/02/18, last labs 05/02/18 Creat 0.97, age 78, weight 85.7kg, based on specified criteria pt is on appropriate dosage of Eliquis 5mg  BID.  Will refill rx.

## 2019-02-03 DIAGNOSIS — H3582 Retinal ischemia: Secondary | ICD-10-CM | POA: Diagnosis not present

## 2019-02-03 DIAGNOSIS — H34811 Central retinal vein occlusion, right eye, with macular edema: Secondary | ICD-10-CM | POA: Diagnosis not present

## 2019-02-03 DIAGNOSIS — H35032 Hypertensive retinopathy, left eye: Secondary | ICD-10-CM | POA: Diagnosis not present

## 2019-02-03 DIAGNOSIS — H43813 Vitreous degeneration, bilateral: Secondary | ICD-10-CM | POA: Diagnosis not present

## 2019-03-18 DIAGNOSIS — Z23 Encounter for immunization: Secondary | ICD-10-CM | POA: Diagnosis not present

## 2019-04-17 DIAGNOSIS — H35032 Hypertensive retinopathy, left eye: Secondary | ICD-10-CM | POA: Diagnosis not present

## 2019-04-17 DIAGNOSIS — H34811 Central retinal vein occlusion, right eye, with macular edema: Secondary | ICD-10-CM | POA: Diagnosis not present

## 2019-04-17 DIAGNOSIS — H3582 Retinal ischemia: Secondary | ICD-10-CM | POA: Diagnosis not present

## 2019-04-17 DIAGNOSIS — H43813 Vitreous degeneration, bilateral: Secondary | ICD-10-CM | POA: Diagnosis not present

## 2019-05-28 ENCOUNTER — Telehealth: Payer: Self-pay | Admitting: Interventional Cardiology

## 2019-05-28 NOTE — Progress Notes (Signed)
Cardiology Office Note   Date:  05/29/2019   ID:  [name], [name] 03-Aug-1940, MRN [id number]  PCP:  Street, Sharon Mt, MD    No chief complaint on file.  CAD  Wt Readings from Last 3 Encounters:  05/29/19 180 lb 12.8 oz (82 kg)  05/02/18 189 lb (85.7 kg)  05/02/18 188 lb 6.4 oz (85.5 kg)       History of Present Illness: [name] is a 78 y.o. male  with h/o aortic disection, CAD - DES to circumflex in Sept 2011. 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 He was admitted to the hospital on 10/05/13 for urosepsis and was on the ventilator for 2 days. Also has thoracentesis for pleural effusion (1200cc).   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 He had a retinal vein thrombosis apparently, per the patient's report.  Since the last visit, he has done well.  Getting eye injections.    Since the last visit, he feels fairly well. He walks daily, for about 20 minutes.  Denies : Chest pain. Dizziness. Leg edema. Nitroglycerin use. Orthopnea. Palpitations. Paroxysmal nocturnal dyspnea.  Syncope.  Occasional DOE on his bad days.  Mostly good days.       Past Medical History:  Diagnosis Date  . Aortic valve insufficiency   . Arthritis    "probably in my knees before they replaced them" (01/28/2015)  . Atrial flutter ([place])   . CAD (coronary artery disease)   . CHF (congestive heart failure) ([place])   . Descending aortic aneurysm ([place])   . DVT (deep venous thrombosis) ([place])    ?LLE post knee surgery  . History of blood transfusion    "after one of my knee surgeries"  . HTN (hypertension)   . Hyperlipidemia   . Thoracic aneurysm   . Tobacco abuse     Past Surgical History:  Procedure Laterality Date  . AORTIC VALVE REPLACEMENT  02/19/2009  . AORTO-FEMORAL BYPASS GRAFT  03/2010   ascending aortic and arch aneurysm repair/notes 04/09/2009   . CARDIAC VALVE REPLACEMENT    . CAROTID-SUBCLAVIAN BYPASS GRAFT Left 08/26/2013   Procedure: BYPASS GRAFT CAROTID-SUBCLAVIAN;  Surgeon: Serafina Mitchell, MD;  Location: [facility];  Service: Vascular;  Laterality: Left;  . CATARACT EXTRACTION W/ INTRAOCULAR LENS  IMPLANT, BILATERAL Bilateral   . ENDOVASCULAR STENT INSERTION N/A 08/26/2013   Procedure:  THORACIC STENT GRAFT INSERTION;  Surgeon: Serafina Mitchell, MD;  Location[facility];  Service: Vascular;  Laterality: N/A;  . JOINT REPLACEMENT    . KNEE ARTHROSCOPY Bilateral   . REPLACEMENT TOTAL KNEE Bilateral   . right groin lymphocele Right 03/29/2009  . THORACENTESIS  2010 X 2  . TONSILLECTOMY       Current Outpatient Medications  Medication Sig Dispense Refill  . amiodarone (PACERONE) 200 MG tablet TAKE 1 TABLET BY MOUTH ONCE DAILY 90 tablet 3  . apixaban (ELIQUIS) 5 MG TABS tablet Take 1 tablet (5 mg total) by mouth 2 (two) times daily. 180 tablet 1  . atorvastatin (LIPITOR) 10 MG tablet TAKE 1 TABLET BY MOUTH ONCE DAILY 90 tablet 3  . furosemide (LASIX) 40 MG tablet TAKE 1 TABLET BY MOUTH TWICE DAILY 60 tablet 11  . loratadine (CLARITIN) 10 MG tablet Take 1 tablet (10 mg total) by mouth daily. 30 tablet 11  . metoprolol tartrate (LOPRESSOR)  25 MG tablet Take 1 tablet by mouth twice daily 180 tablet 3  . Multiple Vitamin (MULTIVITAMIN WITH MINERALS) TABS tablet Take 1 tablet by mouth daily.    . nitroGLYCERIN (NITROSTAT) 0.4 MG SL tablet Place 1 tablet (0.4 mg total) every 5 (five) minutes as needed under the tongue for chest pain (X3 DOSES BEFORE CALLING 911). 25 tablet 3  . potassium chloride SA (K-DUR,KLOR-CON) 20 MEQ tablet TAKE 2 TABLETS BY MOUTH ONCE DAILY 180 tablet 3   No current facility-administered medications for this visit.    Allergies:   Lortab [hydrocodone-acetaminophen] and Morphine and related    Social History:  The patient  reports that he quit smoking about 45 years ago. His smoking use included cigarettes. He smoked  0.00 packs per day. He has never used smokeless tobacco. He reports that he does not drink alcohol or use drugs.   Family History:  The patient's family history includes Aortic aneurysm in his father; Celiac disease in his daughter; Hypertension in his mother.    ROS:  Please see the history of present illness.   Otherwise, review of systems are positive for .   All other systems are reviewed and negative.    PHYSICAL EXAM: VS:  BP 126/84   Pulse 92   Ht 5\' 11"  (1.803 m)   Wt 180 lb 12.8 oz (82 kg)   SpO2 98%   BMI 25.22 kg/m  , BMI Body mass index is 25.22 kg/m. GEN: Well nourished, well developed, in no acute distress  HEENT: normal  Neck: no JVD, carotid bruits, or masses Cardiac: RRR; 2/6 systolic murmur, no rubs, or gallops,no edema  Respiratory:  clear to auscultation bilaterally, normal work of breathing GI: soft, nontender, nondistended, + BS MS: mild left ankle swelling Skin: warm and dry, no rash Neuro:  Strength and sensation are intact Psych: euthymic mood, full affect   EKG:   The ekg ordered today demonstrates slow Atrial flutter, 2:1; rate controlled   Recent Labs: No results found for requested labs within last 8760 hours.   Lipid Panel    Component Value Date/Time   CHOL 152 10/10/2013 1220   TRIG 116 10/07/2013 0320   HDL 38 (L) 07/29/2010 0210   CHOLHDL 3.6 07/29/2010 0210   VLDL 18 07/29/2010 0210   LDLCALC  07/29/2010 0210    80        Total Cholesterol/HDL:CHD Risk Coronary Heart Disease Risk Table                     Men   Women  1/2 Average Risk   3.4   3.3  Average Risk       5.0   4.4  2 X Average Risk   9.6   7.1  3 X Average Risk  23.4   11.0        Use the calculated Patient Ratio above and the CHD Risk Table to determine the patient's CHD Risk.        ATP III CLASSIFICATION (LDL):  <100     mg/dL   Optimal  09/27/2010  mg/dL   Near or Above                    Optimal  130-159  mg/dL   Borderline  811-572  mg/dL   High  620-355      mg/dL   Very High     Other studies Reviewed: Additional studies/ records that were reviewed  today with results demonstrating: prior ECG showed HR 108.   ASSESSMENT AND PLAN:  1. 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  COntinue aggressive secondary prevention.  2. Aortic dissection: s/p repair.  3. AFib: He appears to be in atrial flutter , 2:1 today.  No sx.  4. Mild mitral regurgitation: No CHF.  5. Bradycardia: Now in atrial flutter 6. Anticoagulated: No bleeding issues.   Current medicines are reviewed at length with the patient today.  The patient concerns regarding his medicines were addressed.  The following changes have been made:  No change  Labs/ tests ordered today include: To get labs at PMD, CBC BMet lipids, liver No orders of the defined types were placed in this encounter.   Recommend 150 minutes/week of aerobic exercise Low fat, low carb, high fiber diet recommended  Disposition:   FU in 1 year   Signed, Lance Muss, MD  05/29/2019 4:42 PM    [facility] [address] [place], [place], [place]  [address] Phone: [phone]; Fax: [phone]

## 2019-05-28 NOTE — Telephone Encounter (Signed)
Wife aware ok for her to accompany pt to appt tomorrow. She appreciates our consideration of this matter.

## 2019-05-28 NOTE — Telephone Encounter (Signed)
New message:   Wife called and said she will need to come in with pt for his appt tomorrow with Dr Irish Lack. She says pt can not hear.

## 2019-05-29 ENCOUNTER — Encounter: Payer: Self-pay | Admitting: Interventional Cardiology

## 2019-05-29 ENCOUNTER — Other Ambulatory Visit: Payer: Self-pay

## 2019-05-29 ENCOUNTER — Ambulatory Visit (INDEPENDENT_AMBULATORY_CARE_PROVIDER_SITE_OTHER): Payer: Medicare Other | Admitting: Interventional Cardiology

## 2019-05-29 VITALS — BP 126/84 | HR 92 | Ht 71.0 in | Wt 180.8 lb

## 2019-05-29 DIAGNOSIS — Z952 Presence of prosthetic heart valve: Secondary | ICD-10-CM | POA: Diagnosis not present

## 2019-05-29 DIAGNOSIS — I480 Paroxysmal atrial fibrillation: Secondary | ICD-10-CM

## 2019-05-29 DIAGNOSIS — I2510 Atherosclerotic heart disease of native coronary artery without angina pectoris: Secondary | ICD-10-CM | POA: Diagnosis not present

## 2019-05-29 DIAGNOSIS — Z7901 Long term (current) use of anticoagulants: Secondary | ICD-10-CM

## 2019-05-29 MED ORDER — METOPROLOL TARTRATE 25 MG PO TABS: 25.0000 mg | ORAL_TABLET | Freq: Two times a day (BID) | ORAL | 11 refills | Status: DC

## 2019-05-29 MED ORDER — NITROGLYCERIN 0.4 MG SL SUBL: 0.4000 mg | SUBLINGUAL_TABLET | SUBLINGUAL | 3 refills | Status: DC | PRN

## 2019-05-29 MED ORDER — ATORVASTATIN CALCIUM 10 MG PO TABS: 10.0000 mg | ORAL_TABLET | Freq: Every day | ORAL | 11 refills | Status: DC

## 2019-05-29 MED ORDER — APIXABAN 5 MG PO TABS: 5.0000 mg | ORAL_TABLET | Freq: Two times a day (BID) | ORAL | 1 refills | Status: DC

## 2019-05-29 MED ORDER — AMIODARONE HCL 200 MG PO TABS: 200.0000 mg | ORAL_TABLET | Freq: Every day | ORAL | 11 refills | Status: DC

## 2019-05-29 NOTE — Patient Instructions (Signed)
Medication Instructions:  Your physician recommends that you continue on your current medications as directed. Please refer to the Current Medication list given to you today.  *If you need a refill on your cardiac medications before your next appointment, please call your pharmacy*  Lab Work: Your physician recommends that you have lab work done at your Primary Care Doctor (CBC, CMET, LIPIDS)  If you have labs (blood work) drawn today and your tests are completely normal, you will receive your results only by: Marland Kitchen MyChart Message (if you have MyChart) OR . A paper copy in the mail If you have any lab test that is abnormal or we need to change your treatment, we will call you to review the results.  Testing/Procedures: None ordered  Follow-Up: At [facility], you and your health needs are our priority.  As part of our continuing mission to provide you with exceptional heart care, we have created designated Provider Care Teams.  These Care Teams include your primary Cardiologist (physician) and Advanced Practice Providers (APPs -  Physician Assistants and Nurse Practitioners) who all work together to provide you with the care you need, when you need it.  Your next appointment:   12 month(s)  The format for your next appointment:   In Person  Provider:   You may see Larae Grooms, MD or one of the following Advanced Practice Providers on your designated Care Team:    Melina Copa, PA-C  Ermalinda Barrios, PA-C   Other Instructions

## 2019-06-02 ENCOUNTER — Other Ambulatory Visit: Payer: Self-pay | Admitting: Interventional Cardiology

## 2019-06-14 ENCOUNTER — Other Ambulatory Visit: Payer: Self-pay | Admitting: Interventional Cardiology

## 2019-06-26 DIAGNOSIS — H35033 Hypertensive retinopathy, bilateral: Secondary | ICD-10-CM | POA: Diagnosis not present

## 2019-06-26 DIAGNOSIS — H43813 Vitreous degeneration, bilateral: Secondary | ICD-10-CM | POA: Diagnosis not present

## 2019-06-26 DIAGNOSIS — H3582 Retinal ischemia: Secondary | ICD-10-CM | POA: Diagnosis not present

## 2019-06-26 DIAGNOSIS — H34811 Central retinal vein occlusion, right eye, with macular edema: Secondary | ICD-10-CM | POA: Diagnosis not present

## 2019-07-23 ENCOUNTER — Telehealth: Payer: Self-pay | Admitting: *Deleted

## 2019-07-23 DIAGNOSIS — K4030 Unilateral inguinal hernia, with obstruction, without gangrene, not specified as recurrent: Secondary | ICD-10-CM | POA: Diagnosis not present

## 2019-07-23 NOTE — Telephone Encounter (Signed)
Patient with diagnosis of atrial fibrillation on Eliquis for anticoagulation.    Procedure: left inguinal hernia repair Date of procedure: TBD  CHADS2-VASc score of  7 (CHF, HTN, AGE x 2, stroke/tia equivalent x 2, CAD)  Note that chart indicates patient had a provoked DVT after knee surgery but no date available - was prior to 2014 office note  CrCl 63.2  Platelet count 247  Per office protocol, patient can hold Eliquis for 1 days prior to procedure.    If you feel that he should hold Eliquis any longer, please contact us, as with his previous provoked DVT we would need to consider lovenox bridge

## 2019-07-23 NOTE — Telephone Encounter (Signed)
   [facility] Pre-operative Risk Assessment    Request for surgical clearance:  1. What type of surgery is being performed? LEFT INGUINAL HERNIA REPAIR   2. When is this surgery scheduled? TBD   3. What type of clearance is required (medical clearance vs. Pharmacy clearance to hold med vs. Both)? BOTH  4. Are there any medications that need to be held prior to surgery and how long? ELIQUIS   5. Practice name and name of physician performing surgery? [facility]; DR. Lavone Neri THOMPSON   6. What is your office phone number [phone]    7.   What is your office fax number [phone]  8.   Anesthesia type (None, local, MAC, general) ?  ATTNAndreas Blower, CMA   Julaine Hua 07/23/2019, 1:11 PM  _________________________________________________________________   (provider comments below)

## 2019-07-24 NOTE — Telephone Encounter (Signed)
Attempted to call patient twice. Phone just kept ringing and I was unable to leave a voicemail. Will try again later.

## 2019-07-25 NOTE — Telephone Encounter (Signed)
   Primary Cardiologist: Lance Muss, MD  Chart reviewed as part of pre-operative protocol coverage. Patient was contacted 07/25/2019 in reference to pre-operative risk assessment for pending surgery as outlined below.  IMANI SHERRIN was last seen on 05/28/2019 by [name].  Since that day, CHRISEAN KLOTH has done well.  Therefore, based on ACC/AHA guidelines, the patient would be at acceptable risk for the planned procedure without further cardiovascular testing.   He is requested to take the amiodarone and metoprolol the morning of the surgery.  He should not miss any doses of those medications.  The pharmacist has cleared him to hold the Eliquis for 24 hours, any longer than that, and he will need a Lovenox bridge  I will route this recommendation to the requesting party via Epic fax function and remove from pre-op pool.  Please call with questions.  Theodore Demark, PA-C 07/25/2019, 9:45 AM

## 2019-07-28 NOTE — Telephone Encounter (Signed)
I would be willing to have him take last dose 36 hours before surgery.  I believe that is the same as holding 24 hours prior.  JV

## 2019-07-31 ENCOUNTER — Ambulatory Visit: Payer: Self-pay | Admitting: General Surgery

## 2019-09-04 ENCOUNTER — Other Ambulatory Visit: Payer: Self-pay

## 2019-09-04 ENCOUNTER — Inpatient Hospital Stay (HOSPITAL_COMMUNITY)
Admission: EM | Admit: 2019-09-04 | Discharge: 2019-09-08 | DRG: 184 | Disposition: A | Discharge status: Home Health | Payer: Medicare Other | Attending: General Surgery | Admitting: General Surgery

## 2019-09-04 ENCOUNTER — Emergency Department (HOSPITAL_COMMUNITY): Payer: Medicare Other

## 2019-09-04 ENCOUNTER — Encounter (HOSPITAL_COMMUNITY): Payer: Self-pay | Admitting: General Surgery

## 2019-09-04 DIAGNOSIS — R0602 Shortness of breath: Secondary | ICD-10-CM | POA: Diagnosis not present

## 2019-09-04 DIAGNOSIS — Z7901 Long term (current) use of anticoagulants: Secondary | ICD-10-CM

## 2019-09-04 DIAGNOSIS — Z87891 Personal history of nicotine dependence: Secondary | ICD-10-CM | POA: Diagnosis not present

## 2019-09-04 DIAGNOSIS — K4030 Unilateral inguinal hernia, with obstruction, without gangrene, not specified as recurrent: Secondary | ICD-10-CM | POA: Diagnosis not present

## 2019-09-04 DIAGNOSIS — W19XXXA Unspecified fall, initial encounter: Secondary | ICD-10-CM

## 2019-09-04 DIAGNOSIS — S2241XA Multiple fractures of ribs, right side, initial encounter for closed fracture: Principal | ICD-10-CM | POA: Diagnosis present

## 2019-09-04 DIAGNOSIS — Z20822 Contact with and (suspected) exposure to covid-19: Secondary | ICD-10-CM | POA: Diagnosis present

## 2019-09-04 DIAGNOSIS — K4090 Unilateral inguinal hernia, without obstruction or gangrene, not specified as recurrent: Secondary | ICD-10-CM | POA: Diagnosis present

## 2019-09-04 DIAGNOSIS — J90 Pleural effusion, not elsewhere classified: Secondary | ICD-10-CM | POA: Diagnosis present

## 2019-09-04 DIAGNOSIS — I110 Hypertensive heart disease with heart failure: Secondary | ICD-10-CM | POA: Diagnosis present

## 2019-09-04 DIAGNOSIS — Z95828 Presence of other vascular implants and grafts: Secondary | ICD-10-CM | POA: Diagnosis not present

## 2019-09-04 DIAGNOSIS — Z86718 Personal history of other venous thrombosis and embolism: Secondary | ICD-10-CM | POA: Diagnosis not present

## 2019-09-04 DIAGNOSIS — Z8249 Family history of ischemic heart disease and other diseases of the circulatory system: Secondary | ICD-10-CM

## 2019-09-04 DIAGNOSIS — I700 Atherosclerosis of aorta: Secondary | ICD-10-CM | POA: Diagnosis present

## 2019-09-04 DIAGNOSIS — S2249XA Multiple fractures of ribs, unspecified side, initial encounter for closed fracture: Secondary | ICD-10-CM

## 2019-09-04 DIAGNOSIS — I509 Heart failure, unspecified: Secondary | ICD-10-CM | POA: Diagnosis present

## 2019-09-04 DIAGNOSIS — G4700 Insomnia, unspecified: Secondary | ICD-10-CM | POA: Diagnosis present

## 2019-09-04 DIAGNOSIS — S3991XA Unspecified injury of abdomen, initial encounter: Secondary | ICD-10-CM | POA: Diagnosis not present

## 2019-09-04 DIAGNOSIS — Z96653 Presence of artificial knee joint, bilateral: Secondary | ICD-10-CM | POA: Diagnosis present

## 2019-09-04 DIAGNOSIS — I2510 Atherosclerotic heart disease of native coronary artery without angina pectoris: Secondary | ICD-10-CM | POA: Diagnosis present

## 2019-09-04 DIAGNOSIS — I4819 Other persistent atrial fibrillation: Secondary | ICD-10-CM | POA: Diagnosis present

## 2019-09-04 DIAGNOSIS — S2239XA Fracture of one rib, unspecified side, initial encounter for closed fracture: Secondary | ICD-10-CM

## 2019-09-04 DIAGNOSIS — S199XXA Unspecified injury of neck, initial encounter: Secondary | ICD-10-CM | POA: Diagnosis not present

## 2019-09-04 DIAGNOSIS — I4891 Unspecified atrial fibrillation: Secondary | ICD-10-CM | POA: Diagnosis not present

## 2019-09-04 DIAGNOSIS — S2231XA Fracture of one rib, right side, initial encounter for closed fracture: Secondary | ICD-10-CM | POA: Diagnosis not present

## 2019-09-04 DIAGNOSIS — Z885 Allergy status to narcotic agent status: Secondary | ICD-10-CM | POA: Diagnosis not present

## 2019-09-04 DIAGNOSIS — Z79899 Other long term (current) drug therapy: Secondary | ICD-10-CM

## 2019-09-04 DIAGNOSIS — S0990XA Unspecified injury of head, initial encounter: Secondary | ICD-10-CM | POA: Diagnosis not present

## 2019-09-04 DIAGNOSIS — Z953 Presence of xenogenic heart valve: Secondary | ICD-10-CM

## 2019-09-04 DIAGNOSIS — E785 Hyperlipidemia, unspecified: Secondary | ICD-10-CM | POA: Diagnosis present

## 2019-09-04 DIAGNOSIS — S299XXA Unspecified injury of thorax, initial encounter: Secondary | ICD-10-CM | POA: Diagnosis not present

## 2019-09-04 DIAGNOSIS — W010XXA Fall on same level from slipping, tripping and stumbling without subsequent striking against object, initial encounter: Secondary | ICD-10-CM | POA: Diagnosis present

## 2019-09-04 HISTORY — DX: Multiple fractures of ribs, right side, initial encounter for closed fracture: S22.41XA

## 2019-09-04 LAB — I-STAT CHEM 8, ED
BUN: 23 mg/dL (ref 8–23)
Calcium, Ion: 1.11 mmol/L — ABNORMAL LOW (ref 1.15–1.40)
Chloride: 106 mmol/L (ref 98–111)
Creatinine, Ser: 1 mg/dL (ref 0.61–1.24)
Glucose, Bld: 114 mg/dL — ABNORMAL HIGH (ref 70–99)
HCT: 29 % — ABNORMAL LOW (ref 39.0–52.0)
Hemoglobin: 9.9 g/dL — ABNORMAL LOW (ref 13.0–17.0)
Potassium: 3.8 mmol/L (ref 3.5–5.1)
Sodium: 143 mmol/L (ref 135–145)
TCO2: 28 mmol/L (ref 22–32)

## 2019-09-04 LAB — CBC WITH DIFFERENTIAL/PLATELET
Abs Immature Granulocytes: 0.07 10*3/uL (ref 0.00–0.07)
Basophils Absolute: 0 10*3/uL (ref 0.0–0.1)
Basophils Relative: 0 %
Eosinophils Absolute: 0.1 10*3/uL (ref 0.0–0.5)
Eosinophils Relative: 2 %
HCT: 35.7 % — ABNORMAL LOW (ref 39.0–52.0)
Hemoglobin: 9.8 g/dL — ABNORMAL LOW (ref 13.0–17.0)
Immature Granulocytes: 1 %
Lymphocytes Relative: 9 %
Lymphs Abs: 0.7 10*3/uL (ref 0.7–4.0)
MCH: 26.5 pg (ref 26.0–34.0)
MCHC: 27.5 g/dL — ABNORMAL LOW (ref 30.0–36.0)
MCV: 96.5 fL (ref 80.0–100.0)
Monocytes Absolute: 0.6 10*3/uL (ref 0.1–1.0)
Monocytes Relative: 8 %
Neutro Abs: 6.6 10*3/uL (ref 1.7–7.7)
Neutrophils Relative %: 80 %
Platelets: 300 10*3/uL (ref 150–400)
RBC: 3.7 MIL/uL — ABNORMAL LOW (ref 4.22–5.81)
RDW: 16.7 % — ABNORMAL HIGH (ref 11.5–15.5)
WBC: 8.2 10*3/uL (ref 4.0–10.5)
nRBC: 0 % (ref 0.0–0.2)

## 2019-09-04 LAB — COMPREHENSIVE METABOLIC PANEL
ALT: 21 U/L (ref 0–44)
AST: 30 U/L (ref 15–41)
Albumin: 4.1 g/dL (ref 3.5–5.0)
Alkaline Phosphatase: 59 U/L (ref 38–126)
Anion gap: 16 — ABNORMAL HIGH (ref 5–15)
BUN: 25 mg/dL — ABNORMAL HIGH (ref 8–23)
CO2: 22 mmol/L (ref 22–32)
Calcium: 9.1 mg/dL (ref 8.9–10.3)
Chloride: 106 mmol/L (ref 98–111)
Creatinine, Ser: 1.11 mg/dL (ref 0.61–1.24)
GFR calc Af Amer: >60 mL/min (ref >60)
GFR calc non Af Amer: >60 mL/min (ref >60)
Glucose, Bld: 117 mg/dL — ABNORMAL HIGH (ref 70–99)
Potassium: 4.3 mmol/L (ref 3.5–5.1)
Sodium: 144 mmol/L (ref 135–145)
Total Bilirubin: 0.9 mg/dL (ref 0.3–1.2)
Total Protein: 7.2 g/dL (ref 6.5–8.1)

## 2019-09-04 LAB — TYPE AND SCREEN
ABO/RH(D): A POS
Antibody Screen: NEGATIVE

## 2019-09-04 LAB — SARS CORONAVIRUS 2 (TAT 6-24 HRS): SARS Coronavirus 2: NEGATIVE

## 2019-09-04 MED ORDER — FENTANYL CITRATE (PF) 100 MCG/2ML IJ SOLN
50.0000 ug | INTRAMUSCULAR | Status: DC | PRN
  Administered 2019-09-04 – 2019-09-05 (×2): 50 ug via INTRAVENOUS
  Filled 2019-09-04 (×2): qty 2

## 2019-09-04 MED ORDER — SODIUM CHLORIDE 0.9 % IV SOLN: 250.0000 mL | INTRAVENOUS | Status: DC | PRN

## 2019-09-04 MED ORDER — FUROSEMIDE 40 MG PO TABS
40.0000 mg | ORAL_TABLET | Freq: Two times a day (BID) | ORAL | Status: DC
  Administered 2019-09-04 – 2019-09-08 (×8): 40 mg via ORAL
  Filled 2019-09-04 (×8): qty 1

## 2019-09-04 MED ORDER — SODIUM CHLORIDE 0.9% FLUSH
3.0000 mL | Freq: Two times a day (BID) | INTRAVENOUS | Status: DC
  Administered 2019-09-04 – 2019-09-08 (×7): 3 mL via INTRAVENOUS

## 2019-09-04 MED ORDER — ACETAMINOPHEN 325 MG PO TABS
650.0000 mg | ORAL_TABLET | Freq: Four times a day (QID) | ORAL | Status: DC
  Administered 2019-09-04 – 2019-09-05 (×3): 650 mg via ORAL
  Filled 2019-09-04 (×3): qty 2

## 2019-09-04 MED ORDER — LORATADINE 10 MG PO TABS
10.0000 mg | ORAL_TABLET | Freq: Every day | ORAL | Status: DC
  Administered 2019-09-05 – 2019-09-08 (×4): 10 mg via ORAL
  Filled 2019-09-04 (×4): qty 1

## 2019-09-04 MED ORDER — METOPROLOL TARTRATE 25 MG PO TABS
12.5000 mg | ORAL_TABLET | Freq: Every day | ORAL | Status: DC
  Administered 2019-09-04 – 2019-09-07 (×4): 12.5 mg via ORAL
  Filled 2019-09-04 (×4): qty 1

## 2019-09-04 MED ORDER — FENTANYL CITRATE (PF) 100 MCG/2ML IJ SOLN
50.0000 ug | Freq: Once | INTRAMUSCULAR | Status: AC
  Administered 2019-09-04: 50 ug via INTRAVENOUS
  Filled 2019-09-04: qty 2

## 2019-09-04 MED ORDER — POTASSIUM CHLORIDE CRYS ER 20 MEQ PO TBCR
40.0000 meq | EXTENDED_RELEASE_TABLET | Freq: Every day | ORAL | Status: DC
  Administered 2019-09-05 – 2019-09-08 (×4): 40 meq via ORAL
  Filled 2019-09-04 (×4): qty 2

## 2019-09-04 MED ORDER — METHOCARBAMOL 1000 MG/10ML IJ SOLN
1000.0000 mg | Freq: Three times a day (TID) | INTRAVENOUS | Status: DC | PRN
  Filled 2019-09-04: qty 10

## 2019-09-04 MED ORDER — SODIUM CHLORIDE 0.9% FLUSH: 3.0000 mL | INTRAVENOUS | Status: DC | PRN

## 2019-09-04 MED ORDER — NITROGLYCERIN 0.4 MG SL SUBL: 0.4000 mg | SUBLINGUAL_TABLET | SUBLINGUAL | Status: DC | PRN

## 2019-09-04 MED ORDER — PANTOPRAZOLE SODIUM 40 MG PO TBEC
40.0000 mg | DELAYED_RELEASE_TABLET | Freq: Every day | ORAL | Status: DC
  Administered 2019-09-05 – 2019-09-08 (×4): 40 mg via ORAL
  Filled 2019-09-04 (×4): qty 1

## 2019-09-04 MED ORDER — AMIODARONE HCL 200 MG PO TABS
200.0000 mg | ORAL_TABLET | Freq: Every day | ORAL | Status: DC
  Administered 2019-09-05 – 2019-09-08 (×4): 200 mg via ORAL
  Filled 2019-09-04 (×4): qty 1

## 2019-09-04 MED ORDER — APIXABAN 5 MG PO TABS
5.0000 mg | ORAL_TABLET | Freq: Two times a day (BID) | ORAL | Status: DC
  Administered 2019-09-04 – 2019-09-08 (×8): 5 mg via ORAL
  Filled 2019-09-04 (×8): qty 1

## 2019-09-04 MED ORDER — SODIUM CHLORIDE 0.9 % IV BOLUS
500.0000 mL | Freq: Once | INTRAVENOUS | Status: AC
  Administered 2019-09-04: 500 mL via INTRAVENOUS

## 2019-09-04 MED ORDER — ADULT MULTIVITAMIN W/MINERALS CH
1.0000 | ORAL_TABLET | Freq: Every day | ORAL | Status: DC
  Administered 2019-09-05 – 2019-09-08 (×4): 1 via ORAL
  Filled 2019-09-04 (×4): qty 1

## 2019-09-04 MED ORDER — FENTANYL CITRATE (PF) 100 MCG/2ML IJ SOLN
25.0000 ug | INTRAMUSCULAR | Status: DC | PRN
  Administered 2019-09-05: 25 ug via INTRAVENOUS
  Filled 2019-09-04: qty 2

## 2019-09-04 MED ORDER — IOHEXOL 300 MG/ML  SOLN
100.0000 mL | Freq: Once | INTRAMUSCULAR | Status: AC | PRN
  Administered 2019-09-04: 100 mL via INTRAVENOUS

## 2019-09-04 MED ORDER — ATORVASTATIN CALCIUM 10 MG PO TABS
10.0000 mg | ORAL_TABLET | Freq: Every morning | ORAL | Status: DC
  Administered 2019-09-05 – 2019-09-08 (×4): 10 mg via ORAL
  Filled 2019-09-04 (×4): qty 1

## 2019-09-04 NOTE — Plan of Care (Signed)

## 2019-09-04 NOTE — ED Provider Notes (Signed)
[name] Provider Note   CSN: [id number] Arrival date & time: 09/04/19  1210     History Chief Complaint  Patient presents with  . Fall  . Chest Pain  . Shortness of Breath    [name] [name] is a 79 y.o. male.  Patient is a 79 year old male with extensive past medical history including aortic valve replacement, aortic aneurysm repair x2, CHF, coronary artery disease, hypertension on Eliquis.  He presents today after a fall.  Patient apparently was walking on concrete when he lost his balance and fell.  He denies loss of consciousness but it does appear as though he has a small bump to his right temporal region.  He reports pain to the right side of his chest and upper abdomen.  He does report some shortness of breath.    The history is provided by the patient.  Fall This is a new problem. The current episode started 1 to 2 hours ago. The problem occurs constantly. The problem has not changed since onset.Nothing aggravates the symptoms. Nothing relieves the symptoms. He has tried nothing for the symptoms.       Past Medical History:  Diagnosis Date  . Aortic valve insufficiency   . Arthritis    "probably in my knees before they replaced them" (01/28/2015)  . Atrial flutter ([place])   . CAD (coronary artery disease)   . CHF (congestive heart failure) ([place])   . Descending aortic aneurysm ([place])   . DVT (deep venous thrombosis) ([place])    ?LLE post knee surgery  . History of blood transfusion    "after one of my knee surgeries"  . HTN (hypertension)   . Hyperlipidemia   . Thoracic aneurysm   . Tobacco abuse     Patient Active Problem List   Diagnosis Date Noted  . Chest pain 01/28/2015  . Near syncope 01/28/2015  . Chronic diastolic heart failure ([place]) 01/28/2015  . HCAP (healthcare-associated pneumonia) 10/11/2013  . Acute respiratory failure with hypoxia ([place]) 10/07/2013  . Shock circulatory ([place]) 10/07/2013  . UTI (urinary  tract infection) 10/06/2013  . Recurrent UTI 10/06/2013  . Urinary retention 10/06/2013  . Insomnia 09/04/2013  . Bradycardia 08/23/2013  . Acute on chronic diastolic heart failure ([place]) 08/22/2013  . Atrial fibrillation ([place]) 05/21/2013  . Atrial flutter ([place])   . Aortic atherosclerosis ([place]) 05/05/2013  . Aortic dissection ([place]) 05/04/2013  . CAD (coronary artery disease)   . S/P AVR (aortic valve replacement)   . Hyperlipidemia   . HTN (hypertension)     Past Surgical History:  Procedure Laterality Date  . AORTIC VALVE REPLACEMENT  02/19/2009  . AORTO-FEMORAL BYPASS GRAFT  03/2010   ascending aortic and arch aneurysm repair/notes 04/09/2009  . CARDIAC VALVE REPLACEMENT    . CAROTID-SUBCLAVIAN BYPASS GRAFT Left 08/26/2013   Procedure: BYPASS GRAFT CAROTID-SUBCLAVIAN;  Surgeon: Serafina Mitchell, MD;  Location: [facility];  Service: Vascular;  Laterality: Left;  . CATARACT EXTRACTION W/ INTRAOCULAR LENS  IMPLANT, BILATERAL Bilateral   . ENDOVASCULAR STENT INSERTION N/A 08/26/2013   Procedure:  THORACIC STENT GRAFT INSERTION;  Surgeon: Serafina Mitchell, MD;  Location: [facility];  Service: Vascular;  Laterality: N/A;  . JOINT REPLACEMENT    . KNEE ARTHROSCOPY Bilateral   . REPLACEMENT TOTAL KNEE Bilateral   . right groin lymphocele Right 03/29/2009  . THORACENTESIS  2010 X 2  . TONSILLECTOMY         Family History  Problem Relation  Age of Onset  . Celiac disease Daughter   . Aortic aneurysm Father   . Hypertension Mother   . Heart attack Neg Hx   . Stroke Neg Hx     Social History   Tobacco Use  . Smoking status: Former Smoker    Packs/day: 0.00    Types: Cigarettes    Quit date: 04/16/1974    Years since quitting: 45.4  . Smokeless tobacco: Never Used  Substance Use Topics  . Alcohol use: No  . Drug use: No    Home Medications Prior to Admission medications   Medication Sig Start Date End Date Taking? Authorizing Provider  amiodarone (PACERONE) 200 MG tablet Take 1  tablet (200 mg total) by mouth daily. 05/29/19   [name], MD  apixaban (ELIQUIS) 5 MG TABS tablet Take 1 tablet (5 mg total) by mouth 2 (two) times daily. 05/29/19   [name], MD  atorvastatin (LIPITOR) 10 MG tablet Take 1 tablet by mouth once daily 06/16/19   [name], MD  furosemide (LASIX) 40 MG tablet Take 1 tablet by mouth twice daily 06/02/19   [name], MD  loratadine (CLARITIN) 10 MG tablet Take 1 tablet (10 mg total) by mouth daily. 06/01/14   [name], MD  metoprolol tartrate (LOPRESSOR) 25 MG tablet Take 1 tablet (25 mg total) by mouth 2 (two) times daily. 05/29/19   [name], MD  Multiple Vitamin (MULTIVITAMIN WITH MINERALS) TABS tablet Take 1 tablet by mouth daily.    [provider]  nitroGLYCERIN (NITROSTAT) 0.4 MG SL tablet Place 1 tablet (0.4 mg total) under the tongue every 5 (five) minutes as needed for chest pain (X3 DOSES BEFORE CALLING 911). 05/29/19   [name], MD  potassium chloride SA (KLOR-CON) 20 MEQ tablet Take 2 tablets by mouth once daily 06/16/19   [name], MD    Allergies    Lortab [hydrocodone-acetaminophen] and Morphine and related  Review of Systems   Review of Systems  All other systems reviewed and are negative.   Physical Exam Updated Vital Signs BP (!) 152/84   Pulse 96   Temp 98 F (36.7 C) (Oral)   Resp 19   SpO2 100%   Physical Exam Vitals and nursing note reviewed.  Constitutional:      General: He is not in acute distress.    Appearance: He is well-developed. He is not diaphoretic.  HENT:     Head: Normocephalic and atraumatic.  Cardiovascular:     Rate and Rhythm: Normal rate and regular rhythm.     Heart sounds: No murmur. No friction rub.  Pulmonary:     Effort: Pulmonary effort is normal. No respiratory distress.     Breath sounds: Normal breath sounds. No wheezing or rales.  Abdominal:     General: Bowel sounds are  normal. There is no distension.     Palpations: Abdomen is soft.     Tenderness: There is no abdominal tenderness.  Musculoskeletal:        General: Normal range of motion.     Cervical back: Normal range of motion and neck supple.  Skin:    General: Skin is warm and dry.  Neurological:     Mental Status: He is alert and oriented to person, place, and time.     Coordination: Coordination normal.     ED Results / Procedures / Treatments   Labs (all labs ordered are listed, but only  abnormal results are displayed) Labs Reviewed  CBC WITH DIFFERENTIAL/PLATELET  COMPREHENSIVE METABOLIC PANEL  I-STAT CHEM 8, ED  TYPE AND SCREEN    EKG ED ECG REPORT   Date: 09/04/2019  Rate: rapid  Rhythm: atrial fibrillation  QRS Axis: right  Intervals: normal  ST/T Wave abnormalities: nonspecific T wave changes  Conduction Disutrbances:none  Narrative Interpretation:   Old EKG Reviewed: When compared with prior ECG, atrial fibrillation has replaced sinus rhythm  I have personally reviewed the EKG tracing and agree with the computerized printout as noted.   Radiology No results found.  Procedures Procedures (including critical care time)  Medications Ordered in ED Medications  sodium chloride 0.9 % bolus 500 mL (has no administration in time range)    ED Course  I have reviewed the triage vital signs and the nursing notes.  Pertinent labs & imaging results that were available during my care of the patient were reviewed by me and considered in my medical decision making (see chart for details).  MDM Rules/Calculators/A&P  Patient is a 79 year old male with extensive medical history on anticoagulation.  He presents today for evaluation of a fall.  Patient apparently fell while walking in the street today and injured his right side.  Patient's work-up shows multiple right-sided rib fractures with small pleural effusion that I suspect may be a hemothorax.  There is no evidence for  pneumothorax and oxygen saturations and hemodynamic status are adequate.  There is no evidence for internal injury and his aortic grafts appear to be intact.  I have discussed these findings with Dr. [name] from trauma surgery.  Patient will be evaluated by trauma and likely admitted.  CRITICAL CARE Performed by: Geoffery Lyons Total critical care time: 35 minutes Critical care time was exclusive of separately billable procedures and treating other patients. Critical care was necessary to treat or prevent imminent or life-threatening deterioration. Critical care was time spent personally by me on the following activities: development of treatment plan with patient and/or surrogate as well as nursing, discussions with consultants, evaluation of patient's response to treatment, examination of patient, obtaining history from patient or surrogate, ordering and performing treatments and interventions, ordering and review of laboratory studies, ordering and review of radiographic studies, pulse oximetry and re-evaluation of patient's condition.   Final Clinical Impression(s) / ED Diagnoses Final diagnoses:  None    Rx / DC Orders ED Discharge Orders    None       Geoffery Lyons, MD 09/04/19 1512

## 2019-09-04 NOTE — ED Triage Notes (Signed)
Pt c/o fall today landing on his right arm and rib area. SOB at triage. He is on eloquis with a hx of dissections. Charge notified of his complaint Level 2 to be activated.

## 2019-09-04 NOTE — Progress Notes (Signed)
Orthopedic Tech Progress Note Patient Details:  [name] 1941/01/07 [id number] Level 2 trauma  Patient ID: [name], male   DOB: 14-Feb-1941, 79 y.o.   MRN: [id number]   [name] 09/04/2019, 12:37 PM

## 2019-09-04 NOTE — H&P (Signed)
[name] [name] 1940-12-18  [id number].    Chief Complaint: fall with multiple rib fractures  HPI:  This is a 79 yo male with multiple medical problems including a flutter, CAD, CHF, aortic aneurysm s/p extensive stenting, HTN, HLD, on eliquis for stenting.  He is known to Dr. [name] for a large left inguinal hernia that was scheduled to be repaired next week.  He had been cleared by cardiology.  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  He did hit the right front of his head but didn't lose consciousness.  He fell on his right arm per the wife, but denies any right arm or shoulder pain.  His pain is in his right back.  He admits to pain with breathing.  He denies any other complaints.  Due to pain, he was brought to the ED where he had a negative head CT and a CT of the chest/abd/pel that revealed posterior right 6,7, and 8 rib fractures.  Due to pain control issues, we have been asked to see him for further evaluation and admission.  ROS: ROS: Please see HPI, otherwise all other systems are negative.  His left inguinal hernia is stable.  He is eating, moving his bowels, with no other issues.  Family History  Problem Relation Age of Onset  . Celiac disease Daughter   . Aortic aneurysm Father   . Hypertension Mother   . Heart attack Neg Hx   . Stroke Neg Hx     Past Medical History:  Diagnosis Date  . Aortic valve insufficiency   . Arthritis    "probably in my knees before they replaced them" (01/28/2015)  . Atrial flutter (HCC)   . CAD (coronary artery disease)   . CHF (congestive heart failure) (HCC)   . Descending aortic aneurysm (HCC)   . DVT (deep venous thrombosis) (HCC)    ?LLE post knee surgery  . History of blood transfusion    "after one of my knee surgeries"  . HTN (hypertension)   . Hyperlipidemia   . Thoracic aneurysm   . Tobacco abuse     Past Surgical  History:  Procedure Laterality Date  . AORTIC VALVE REPLACEMENT  02/19/2009  . AORTO-FEMORAL BYPASS GRAFT  03/2010   ascending aortic and arch aneurysm repair/notes 04/09/2009  . CARDIAC VALVE REPLACEMENT    . CAROTID-SUBCLAVIAN BYPASS GRAFT Left 08/26/2013   Procedure: BYPASS GRAFT CAROTID-SUBCLAVIAN;  Surgeon: Nada Libman, MD;  Location: [facility] OR;  Service: Vascular;  Laterality: Left;  . CATARACT EXTRACTION W/ INTRAOCULAR LENS  IMPLANT, BILATERAL Bilateral   . ENDOVASCULAR STENT INSERTION N/A 08/26/2013   Procedure:  THORACIC STENT GRAFT INSERTION;  Surgeon: Nada Libman, MD;  Location: [facility] OR;  Service: Vascular;  Laterality: N/A;  . JOINT REPLACEMENT    . KNEE ARTHROSCOPY Bilateral   . REPLACEMENT TOTAL KNEE Bilateral   . right groin lymphocele Right 03/29/2009  . THORACENTESIS  2010 X 2  . TONSILLECTOMY      Social History:  reports that he quit smoking about 45 years ago. His smoking use included cigarettes. He smoked 0.00 packs per day. He has never used smokeless tobacco. He reports that he does not drink alcohol or use drugs.  Allergies:  Allergies  Allergen Reactions  . Lortab [Hydrocodone-Acetaminophen] Hives  . Morphine And Related Hives  . Other Other (  See Comments)    Staples from surgery caused infection    (Not in a hospital admission)    Physical Exam: Blood pressure 130/65, pulse 93, temperature 98.7 F (37.1 C), temperature source Oral, resp. rate (!) 22, height 5\' 10"  (1.778 m), weight 79.4 kg, SpO2 96 %. General: pleasant, WD, WN white male who is laying in bed in NAD HEENT: head is normocephalic, but has a pon on the right parietal scalp at his hairline.  Sclera are noninjected.  PERRL.  Ears and nose without any masses or lesions.  Mouth is pink and moist Nec: trachea midline, no obvious thyromegaly Heart: regular, rate, and rhythm.  Normal s1,s2. No obvious murmurs, gallops, or rubs noted.  Palpable radial and pedal pulses bilaterally Lungs: CTAB, no  wheezes, rhonchi, or rales noted.  Respiratory effort nonlabored.  Posterior chest wall pain Abd: soft, NT, ND, +BS, no masses or organomegaly.  Large soft chronically incarcerated, but not strangulated left inguinal hernia MS: all 4 extremities are symmetrical with no cyanosis, clubbing, or edema. Skin: warm and dry with no masses, lesions, or rashes, but he does have a small abrasion on his right knee Neuro: Cranial nerves 2-12 grossly intact, sensation is normal throughout Psych: A&Ox3 with an appropriate affect.   Results for orders placed or performed during the hospital encounter of 09/04/19 (from the past 48 hour(s))  CBC with Differential     Status: Abnormal   Collection Time: 09/04/19 12:20 PM  Result Value Ref Range   WBC 8.2 4.0 - 10.5 K/uL   RBC 3.70 (L) 4.22 - 5.81 MIL/uL   Hemoglobin 9.8 (L) 13.0 - 17.0 g/dL   HCT 35.7 (L) 39.0 - 52.0 %   MCV 96.5 80.0 - 100.0 fL   MCH 26.5 26.0 - 34.0 pg   MCHC 27.5 (L) 30.0 - 36.0 g/dL   RDW 16.7 (H) 11.5 - 15.5 %   Platelets 300 150 - 400 K/uL   nRBC 0.0 0.0 - 0.2 %   Neutrophils Relative % 80 %   Neutro Abs 6.6 1.7 - 7.7 K/uL   Lymphocytes Relative 9 %   Lymphs Abs 0.7 0.7 - 4.0 K/uL   Monocytes Relative 8 %   Monocytes Absolute 0.6 0.1 - 1.0 K/uL   Eosinophils Relative 2 %   Eosinophils Absolute 0.1 0.0 - 0.5 K/uL   Basophils Relative 0 %   Basophils Absolute 0.0 0.0 - 0.1 K/uL   Immature Granulocytes 1 %   Abs Immature Granulocytes 0.07 0.00 - 0.07 K/uL    Comment: Performed at [facility], [address], [place], [place] [address]  Comprehensive metabolic panel     Status: Abnormal   Collection Time: 09/04/19 12:20 PM  Result Value Ref Range   Sodium 144 135 - 145 mmol/L   Potassium 4.3 3.5 - 5.1 mmol/L   Chloride 106 98 - 111 mmol/L   CO2 22 22 - 32 mmol/L   Glucose, Bld 117 (H) 70 - 99 mg/dL    Comment: Glucose reference range applies only to samples taken after fasting for at least 8 hours.   BUN 25 (H) 8 -  23 mg/dL   Creatinine, Ser 1.11 0.61 - 1.24 mg/dL   Calcium 9.1 8.9 - 10.3 mg/dL   Total Protein 7.2 6.5 - 8.1 g/dL   Albumin 4.1 3.5 - 5.0 g/dL   AST 30 15 - 41 U/L   ALT 21 0 - 44 U/L   Alkaline Phosphatase 59 38 -  126 U/L   Total Bilirubin 0.9 0.3 - 1.2 mg/dL   GFR calc non Af Amer >60 >60 mL/min   GFR calc Af Amer >60 >60 mL/min   Anion gap 16 (H) 5 - 15    Comment: Performed at [facility], [address] [place], [place] [address]  I-stat chem 8, ED (not at [facility] or [facility])     Status: Abnormal   Collection Time: 09/04/19 12:51 PM  Result Value Ref Range   Sodium 143 135 - 145 mmol/L   Potassium 3.8 3.5 - 5.1 mmol/L   Chloride 106 98 - 111 mmol/L   BUN 23 8 - 23 mg/dL   Creatinine, Ser 5.17 0.61 - 1.24 mg/dL   Glucose, Bld 001 (H) 70 - 99 mg/dL    Comment: Glucose reference range applies only to samples taken after fasting for at least 8 hours.   Calcium, Ion 1.11 (L) 1.15 - 1.40 mmol/L   TCO2 28 22 - 32 mmol/L   Hemoglobin 9.9 (L) 13.0 - 17.0 g/dL   HCT 74.9 (L) 44.9 - 67.5 %  Type and screen     Status: None   Collection Time: 09/04/19  2:14 PM  Result Value Ref Range   ABO/RH(D) A POS    Antibody Screen NEG    Sample Expiration      09/07/2019,2359 Performed at [facility], [address] [place], [place] [address]    CT Head Wo Contrast  Result Date: 09/04/2019 CLINICAL DATA:  Fall today landing on right side. On anticoagulation. EXAM: CT HEAD WITHOUT CONTRAST TECHNIQUE: Contiguous axial images were obtained from the base of the skull through the vertex without intravenous contrast. COMPARISON:  None. FINDINGS: Brain: No intracranial hemorrhage, mass effect, or midline shift. Age related atrophy. No hydrocephalus. The basilar cisterns are patent. Mild periventricular chronic small vessel ischemia. Possible small remote lacunar infarct in the left cerebellum. No evidence of territorial infarct or acute ischemia. No extra-axial or intracranial fluid  collection. Vascular: Dolichoectasia of the vertebrobasilar system. No hyperdense vessel. Skull base atherosclerosis. Skull: No fracture or focal lesion. Sinuses/Orbits: Mucous retention cyst in left maxillary sinus. Paranasal sinuses are otherwise clear. Mastoid air cells are well aerated. Bilateral cataract resection. Other: None. IMPRESSION: 1. No acute intracranial abnormality. No skull fracture. 2. Age related atrophy and chronic small vessel ischemia. Electronically Signed   By: Narda Rutherford M.D.   On: 09/04/2019 13:30   CT Chest W Contrast  Result Date: 09/04/2019 CLINICAL DATA:  Fall, shortness of breath, Eliquis, history of aortic dissection EXAM: CT CHEST, ABDOMEN, AND PELVIS WITH CONTRAST TECHNIQUE: Multidetector CT imaging of the chest, abdomen and pelvis was performed following the standard protocol during bolus administration of intravenous contrast. CONTRAST:  OMNIPAQUE IOHEXOL 300 MG/ML  SOLN COMPARISON:  CT chest angiogram, 05/02/2018, CT chest abdomen pelvis angiogram, 01/28/2015 FINDINGS: CT CHEST FINDINGS Cardiovascular: Redemonstrated postoperative findings of aortic valve replacement. Redemonstrated stent endograft repair of the aortic arch and descending thoracic aorta, unchanged in contour and caliber. No evidence of endoleak on this single phase examination. Cardiomegaly with extensive 3 vessel coronary artery calcifications and/or stents. No pericardial effusion. Mediastinum/Nodes: No enlarged mediastinal, hilar, or axillary lymph nodes. Thyroid gland, trachea, and esophagus demonstrate no significant findings. Lungs/Pleura: Mild paraseptal and centrilobular emphysema. Stable, benign 7 mm pulmonary nodule of the anterior right upper lobe (series 5, image 49). Trace right pleural effusion. Musculoskeletal: No chest wall mass or suspicious bone lesions identified. Minimally displaced  fractures of the posterior right 6, seventh, and eighth ribs. CT ABDOMEN PELVIS FINDINGS  Hepatobiliary: No solid liver abnormality is seen. Cholelithiasis. No gallbladder wall thickening, or biliary dilatation. Pancreas: Unremarkable. No pancreatic ductal dilatation or surrounding inflammatory changes. Spleen: Normal in size without significant abnormality. Adrenals/Urinary Tract: Adrenal glands are unremarkable. Heterogeneous soft tissue in fluid attenuation of a exophytic lesion of the superior pole of the right kidney, not significantly changed in size over multiple prior examinations although with new internal soft tissue attenuation and adjacent fat stranding, likely reflecting hemorrhage of a cyst. Bladder is unremarkable. Stomach/Bowel: Stomach is within normal limits. Appendix appears normal. No evidence of bowel wall thickening, distention, or inflammatory changes. Vascular/Lymphatic: Aortic atherosclerosis. Aneurysm of the upper abdominal aorta contiguous with the aneurysmal descending thoracic aorta measuring up to 4.3 x 4.2 cm, not significantly changed. Infrarenal aneurysm of the abdominal aorta measuring up to 3.0 x 3.0 cm, not significantly changed. No enlarged abdominal or pelvic lymph nodes. Reproductive: Mild prostatomegaly. Other: Large left inguinal hernia containing multiple loops of small bowel and sigmoid colon. No abdominopelvic ascites. Musculoskeletal: No acute or significant osseous findings. IMPRESSION: 1. Minimally displaced fractures of the posterior right sixth, seventh, and eighth ribs. Trace right pleural effusion. No pneumothorax. 2. No evidence of acute traumatic injury to the organs of the chest, abdomen or pelvis. 3. Stable appearance of stent endograft repair of the aortic arch and descending thoracic aorta. No evidence of endoleak on this single phase examination. 4. 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 5. Infrarenal abdominal aortic  aneurysm measuring up to 3.0 cm, not significantly changed. Aortic Atherosclerosis (ICD10-I70.0). 6. Coronary artery disease. 7.  Emphysema (ICD10-J43.9). 8. Cholelithiasis. 9. Large left inguinal hernia containing multiple loops of small bowel and sigmoid colon. Electronically Signed   By: Lauralyn Primes M.D.   On: 09/04/2019 13:45   CT Cervical Spine Wo Contrast  Result Date: 09/04/2019 CLINICAL DATA:  Fall today landing on right side. On anticoagulation. EXAM: CT CERVICAL SPINE WITHOUT CONTRAST TECHNIQUE: Multidetector CT imaging of the cervical spine was performed without intravenous contrast. Multiplanar CT image reconstructions were also generated. COMPARISON:  None. FINDINGS: Alignment: Straightening of normal lordosis. Trace anterolisthesis of C3 on C4 is likely degenerative and facet mediated. No traumatic subluxation. Skull base and vertebrae: No acute fracture. Vertebral body heights are maintained. The dens and skull base are intact. Soft tissues and spinal canal: No prevertebral fluid or swelling. No visible canal hematoma. Disc levels: Diffuse disc space narrowing and endplate spurring. Multilevel facet hypertrophy. No high-grade canal stenosis. Upper chest: Assessed on concurrent chest CT, reported separately. Other: Carotid atherosclerosis. IMPRESSION: Multilevel degenerative disc disease and facet hypertrophy. No acute fracture or traumatic subluxation of the cervical spine. Electronically Signed   By: Narda Rutherford M.D.   On: 09/04/2019 13:34   CT ABDOMEN PELVIS W CONTRAST  Result Date: 09/04/2019 CLINICAL DATA:  Fall, shortness of breath, Eliquis, history of aortic dissection EXAM: CT CHEST, ABDOMEN, AND PELVIS WITH CONTRAST TECHNIQUE: Multidetector CT imaging of the chest, abdomen and pelvis was performed following the standard protocol during bolus administration of intravenous contrast. CONTRAST:  OMNIPAQUE IOHEXOL 300 MG/ML  SOLN COMPARISON:  CT chest angiogram, 05/02/2018, CT  chest abdomen pelvis angiogram, 01/28/2015 FINDINGS: CT CHEST FINDINGS Cardiovascular: Redemonstrated postoperative findings of aortic valve replacement. Redemonstrated stent endograft repair of the aortic arch and descending thoracic aorta, unchanged in  contour and caliber. No evidence of endoleak on this single phase examination. Cardiomegaly with extensive 3 vessel coronary artery calcifications and/or stents. No pericardial effusion. Mediastinum/Nodes: No enlarged mediastinal, hilar, or axillary lymph nodes. Thyroid gland, trachea, and esophagus demonstrate no significant findings. Lungs/Pleura: Mild paraseptal and centrilobular emphysema. Stable, benign 7 mm pulmonary nodule of the anterior right upper lobe (series 5, image 49). Trace right pleural effusion. Musculoskeletal: No chest wall mass or suspicious bone lesions identified. Minimally displaced fractures of the posterior right 6, seventh, and eighth ribs. CT ABDOMEN PELVIS FINDINGS Hepatobiliary: No solid liver abnormality is seen. Cholelithiasis. No gallbladder wall thickening, or biliary dilatation. Pancreas: Unremarkable. No pancreatic ductal dilatation or surrounding inflammatory changes. Spleen: Normal in size without significant abnormality. Adrenals/Urinary Tract: Adrenal glands are unremarkable. Heterogeneous soft tissue in fluid attenuation of a exophytic lesion of the superior pole of the right kidney, not significantly changed in size over multiple prior examinations although with new internal soft tissue attenuation and adjacent fat stranding, likely reflecting hemorrhage of a cyst. Bladder is unremarkable. Stomach/Bowel: Stomach is within normal limits. Appendix appears normal. No evidence of bowel wall thickening, distention, or inflammatory changes. Vascular/Lymphatic: Aortic atherosclerosis. Aneurysm of the upper abdominal aorta contiguous with the aneurysmal descending thoracic aorta measuring up to 4.3 x 4.2 cm, not significantly  changed. Infrarenal aneurysm of the abdominal aorta measuring up to 3.0 x 3.0 cm, not significantly changed. No enlarged abdominal or pelvic lymph nodes. Reproductive: Mild prostatomegaly. Other: Large left inguinal hernia containing multiple loops of small bowel and sigmoid colon. No abdominopelvic ascites. Musculoskeletal: No acute or significant osseous findings. IMPRESSION: 1. Minimally displaced fractures of the posterior right sixth, seventh, and eighth ribs. Trace right pleural effusion. No pneumothorax. 2. No evidence of acute traumatic injury to the organs of the chest, abdomen or pelvis. 3. Stable appearance of stent endograft repair of the aortic arch and descending thoracic aorta. No evidence of endoleak on this single phase examination. 4. 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 5. 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 Aortic Atherosclerosis (ICD10-I70.0). 6. Coronary artery disease. 7.  Emphysema (ICD10-J43.9). 8. Cholelithiasis. 9. Large left inguinal hernia containing multiple loops of small bowel and sigmoid colon. Electronically Signed   By: Lauralyn Primes M.D.   On: 09/04/2019 13:45   DG Chest Portable 1 View  Result Date: 09/04/2019 CLINICAL DATA:  Fall. Patient on blood thinners. Shortness of breath. EXAM: PORTABLE CHEST 1 VIEW COMPARISON:  CT 05/02/2018. FINDINGS: Prior median sternotomy. Aortic stent graft appears stable. Stable cardiomegaly with bilateral interstitial prominence suggesting CHF. Pneumonitis cannot be excluded. Small bilateral pleural effusions cannot be excluded. No pneumothorax. No acute bony abnormality identified. IMPRESSION: Prior median sternotomy. Aortic stent graft appears stable. Cardiomegaly with bilateral interstitial prominence suggesting CHF. Pneumonitis can not be excluded. Small bilateral  pleural effusions cannot be excluded. Electronically Signed   By: Maisie Fus  Register   On: 09/04/2019 12:48      Assessment/Plan GLF Right posterior 6,7, and 8th rib fx with trace pleural effusion - admit for pain control, PT/OT.  IS and pulm toilet.  Repeat CXR in am.  CAD - resume home meds HLD - resume home meds Chronically incarcerated but not strangulated left inguinal hernia - will need to delay surgery that was scheduled for next week given rib fractures.  This will impede his ability to breath and get  over that surgery as well as being on a ventilator during surgery. CHF - resume home meds FEN - regular diet VTE - eliquis ID - none Admit - inpatient  Letha Cape, [facility] 09/04/2019, 3:22 PM Please see Amion for pager number during day hours 7:00am-4:30pm or 7:00am -11:30am on weekends

## 2019-09-04 NOTE — ED Notes (Signed)
Pt. Transported to CT 

## 2019-09-05 ENCOUNTER — Other Ambulatory Visit: Payer: Self-pay

## 2019-09-05 ENCOUNTER — Inpatient Hospital Stay (HOSPITAL_COMMUNITY): Payer: Medicare Other

## 2019-09-05 LAB — CBC
HCT: 29.7 % — ABNORMAL LOW (ref 39.0–52.0)
Hemoglobin: 8.5 g/dL — ABNORMAL LOW (ref 13.0–17.0)
MCH: 26.4 pg (ref 26.0–34.0)
MCHC: 28.6 g/dL — ABNORMAL LOW (ref 30.0–36.0)
MCV: 92.2 fL (ref 80.0–100.0)
Platelets: 261 10*3/uL (ref 150–400)
RBC: 3.22 MIL/uL — ABNORMAL LOW (ref 4.22–5.81)
RDW: 16.8 % — ABNORMAL HIGH (ref 11.5–15.5)
WBC: 7 10*3/uL (ref 4.0–10.5)
nRBC: 0 % (ref 0.0–0.2)

## 2019-09-05 LAB — BASIC METABOLIC PANEL
Anion gap: 14 (ref 5–15)
BUN: 22 mg/dL (ref 8–23)
CO2: 25 mmol/L (ref 22–32)
Calcium: 8.7 mg/dL — ABNORMAL LOW (ref 8.9–10.3)
Chloride: 105 mmol/L (ref 98–111)
Creatinine, Ser: 1.03 mg/dL (ref 0.61–1.24)
GFR calc Af Amer: >60 mL/min (ref >60)
GFR calc non Af Amer: >60 mL/min (ref >60)
Glucose, Bld: 126 mg/dL — ABNORMAL HIGH (ref 70–99)
Potassium: 4 mmol/L (ref 3.5–5.1)
Sodium: 144 mmol/L (ref 135–145)

## 2019-09-05 MED ORDER — HYDROXYZINE HCL 10 MG PO TABS
10.0000 mg | ORAL_TABLET | Freq: Three times a day (TID) | ORAL | Status: DC | PRN
  Filled 2019-09-05: qty 1

## 2019-09-05 MED ORDER — POLYETHYLENE GLYCOL 3350 17 G PO PACK
17.0000 g | PACK | Freq: Every day | ORAL | Status: DC
  Administered 2019-09-06 – 2019-09-08 (×3): 17 g via ORAL
  Filled 2019-09-05 (×3): qty 1

## 2019-09-05 MED ORDER — ACETAMINOPHEN 500 MG PO TABS
1000.0000 mg | ORAL_TABLET | Freq: Four times a day (QID) | ORAL | Status: DC
  Administered 2019-09-05 – 2019-09-08 (×13): 1000 mg via ORAL
  Filled 2019-09-05 (×13): qty 2

## 2019-09-05 MED ORDER — METHOCARBAMOL 500 MG PO TABS
1000.0000 mg | ORAL_TABLET | Freq: Three times a day (TID) | ORAL | Status: DC
  Administered 2019-09-05 – 2019-09-08 (×11): 1000 mg via ORAL
  Filled 2019-09-05 (×11): qty 2

## 2019-09-05 MED ORDER — OXYCODONE HCL 5 MG PO TABS
5.0000 mg | ORAL_TABLET | ORAL | Status: DC | PRN
  Administered 2019-09-05 (×2): 5 mg via ORAL
  Administered 2019-09-06: 10 mg via ORAL
  Filled 2019-09-05 (×2): qty 1
  Filled 2019-09-05 (×2): qty 2

## 2019-09-05 NOTE — Evaluation (Signed)
Occupational Therapy Evaluation Patient Details Name: [name] [name] MRN: [id number] DOB: 27-Jun-1940 Today's Date: 09/05/2019    History of Present Illness 79 yo admitted after falling getting into his car with right 6-8 rib fx. PMhx: Aflutter, CAD, CHF, HTN, HLD, inguinal hernia; thoracic aneurysm, descending aortic aneurysm, aortic valve insufficiency   Clinical Impression   Pt admitted with above. He demonstrates the below listed deficits and will benefit from continued OT to maximize safety and independence with BADLs.  Pt presents to OT with increased pain which significantly limited his ability to participate, decreased activity tolerance, generalized weakness, and impaired balance.  Pt currently requires min - total A for ADLs and was only able to progress to EOB sitting due increased pain.   He lives with his wife and reports he was mod I with ADLs PTA.  He hopes to discharge home with assist from wife, however, if he does not progress adequately, he may require SNF.       Follow Up Recommendations  Home health OT;Supervision/Assistance - 24 hour    Equipment Recommendations  3 in 1 bedside commode    Recommendations for Other Services       Precautions / Restrictions Precautions Precautions: Fall      Mobility Bed Mobility Overal bed mobility: Needs Assistance Bed Mobility: Sit to Supine;Supine to Sit   Sidelying to sit: Mod assist;HOB elevated   Sit to supine: Mod assist;HOB elevated   General bed mobility comments: with HOB fully elevated pt required assist to lift and lower LEs and to lift trunk from bed   Transfers Overall transfer level: Needs assistance               General transfer comment: Pt unable due to pain     Balance Overall balance assessment: Needs assistance Sitting-balance support: Feet supported Sitting balance-Leahy Scale: Fair Sitting balance - Comments: able to maintain static sitting with min guard assist                                     ADL either performed or assessed with clinical judgement   ADL Overall ADL's : Needs assistance/impaired Eating/Feeding: Independent   Grooming: Wash/dry hands;Wash/dry face;Set up;Sitting Grooming Details (indicate cue type and reason): supported sitting  Upper Body Bathing: Moderate assistance;Sitting   Lower Body Bathing: Maximal assistance;Sitting/lateral leans;Bed level   Upper Body Dressing : Maximal assistance   Lower Body Dressing: Total assistance;Bed level   Toilet Transfer: Total assistance Toilet Transfer Details (indicate cue type and reason): Pt unable due to pain  Toileting- Clothing Manipulation and Hygiene: Total assistance;Bed level       Functional mobility during ADLs: Moderate assistance(bed mobility only )       Vision         Perception     Praxis      Pertinent Vitals/Pain Pain Assessment: 0-10 Pain Score: 10-Worst pain ever Pain Location: right chest, ribs  Pain Descriptors / Indicators: Aching;Sharp;Radiating;Grimacing;Guarding Pain Intervention(s): Monitored during session;Limited activity within patient's tolerance;Repositioned     Hand Dominance     Extremity/Trunk Assessment Upper Extremity Assessment Upper Extremity Assessment: RUE deficits/detail;LUE deficits/detail RUE Deficits / Details: decreased ROM due to pain   Lower Extremity Assessment Lower Extremity Assessment: Generalized weakness   Cervical / Trunk Assessment Cervical / Trunk Assessment: Normal   Communication Communication Communication: No difficulties   Cognition Arousal/Alertness: Awake/alert Behavior During Therapy: WFL for tasks  assessed/performed Overall Cognitive Status: Within Functional Limits for tasks assessed                                     General Comments  Pt moved to EOB sitting, but could not progress to ADL activity or OOB due to increased pain     Exercises     Shoulder Instructions       Home Living Family/patient expects to be discharged to:: Private residence Living Arrangements: Spouse/significant other Available Help at Discharge: Family;Available 24 hours/day Type of Home: Mobile home Home Access: Stairs to enter Entrance Stairs-Number of Steps: 3 Entrance Stairs-Rails: Right Home Layout: One level     Bathroom Shower/Tub: Producer, television/film/video: Standard     Home Equipment: Environmental consultant - 2 wheels;Cane - single point;Shower seat          Prior Functioning/Environment Level of Independence: Independent                 OT Problem List: Decreased strength;Decreased activity tolerance;Impaired balance (sitting and/or standing);Decreased knowledge of use of DME or AE;Pain      OT Treatment/Interventions: Self-care/ADL training;DME and/or AE instruction;Energy conservation;Therapeutic activities;Patient/family education    OT Goals(Current goals can be found in the care plan section) Acute Rehab OT Goals Patient Stated Goal: to have less pain  OT Goal Formulation: With patient Time For Goal Achievement: 09/19/19 Potential to Achieve Goals: Good ADL Goals Pt Will Perform Grooming: with min assist;standing Pt Will Perform Upper Body Bathing: with set-up;with supervision;sitting Pt Will Perform Lower Body Bathing: with min assist;with adaptive equipment;sit to/from stand Pt Will Perform Upper Body Dressing: with set-up;sitting Pt Will Perform Lower Body Dressing: with min assist;with adaptive equipment;sit to/from stand Pt Will Transfer to Toilet: with min guard assist;ambulating;regular height toilet;bedside commode;grab bars Pt Will Perform Toileting - Clothing Manipulation and hygiene: with min guard assist;sit to/from stand  OT Frequency: Min 2X/week   Barriers to D/C: Decreased caregiver support  unsure if wife able to provide necessary level of assistance at discharge (will be dependent on pt progress)       Co-evaluation               AM-PAC OT "6 Clicks" Daily Activity     Outcome Measure Help from another person eating meals?: None Help from another person taking care of personal grooming?: A Little Help from another person toileting, which includes using toliet, bedpan, or urinal?: Total Help from another person bathing (including washing, rinsing, drying)?: A Lot Help from another person to put on and taking off regular upper body clothing?: A Lot Help from another person to put on and taking off regular lower body clothing?: Total 6 Click Score: 13   End of Session Nurse Communication: Mobility status  Activity Tolerance: Patient limited by pain Patient left: in bed;with call bell/phone within reach;with family/visitor present  OT Visit Diagnosis: Pain;Unsteadiness on feet (R26.81) Pain - Right/Left: Right Pain - part of body: (chest )                Time: 2297-9892 OT Time Calculation (min): 26 min Charges:  OT General Charges $OT Visit: 1 Visit OT Evaluation $OT Eval Moderate Complexity: 1 Mod OT Treatments $Therapeutic Activity: 8-22 mins  Eber Jones., OTR/L Acute Rehabilitation Services Pager [phone] Office [phone]   Jeani Hawking M 09/05/2019, 6:13 PM

## 2019-09-05 NOTE — Progress Notes (Signed)
Patient ID: [name] [name], male   DOB: April 29, 1941, 79 y.o.   MRN: [id number]       Subjective: Patient sitting on the edge of the bed working with PT right now.  Having a lot of pain.  Ate some this morning with no issues.    ROS: See above, otherwise other systems negative  Objective: Vital signs in last 24 hours: Temp:  [97.9 F (36.6 C)-98.7 F (37.1 C)] 98 F (36.7 C) (03/19 0809) Pulse Rate:  [87-100] 87 (03/19 0809) Resp:  [16-22] 17 (03/19 0809) BP: (119-171)/(65-84) 138/81 (03/19 0809) SpO2:  [94 %-100 %] 97 % (03/19 0809) Weight:  [79.4 kg] 79.4 kg (03/18 1245)    Intake/Output from previous day: 03/18 0701 - 03/19 0700 In: 500 [IV Piggyback:500] Out: 500 [Urine:500] Intake/Output this shift: Total I/O In: -  Out: 100 [Urine:100]  PE: Gen: mild distress sitting on the edge of the bed HEENT: PERRL Neck: no thyromegaly Heart: irregular Lungs: CTAB, pulls 750 on IS, having a lot of chest pain from his ribs Abd: soft, NT, ND, +BS Ext: MAE, right knee abrasion is stable Neuro: normal sensation Psych: A&Ox3  Lab Results:  Recent Labs    09/04/19 1220 09/04/19 1220 09/04/19 1251 09/05/19 0317  WBC 8.2  --   --  7.0  HGB 9.8*   < > 9.9* 8.5*  HCT 35.7*   < > 29.0* 29.7*  PLT 300  --   --  261   < > = values in this interval not displayed.   BMET Recent Labs    09/04/19 1220 09/04/19 1220 09/04/19 1251 09/05/19 0317  NA 144   < > 143 144  K 4.3   < > 3.8 4.0  CL 106   < > 106 105  CO2 22  --   --  25  GLUCOSE 117*   < > 114* 126*  BUN 25*   < > 23 22  CREATININE 1.11   < > 1.00 1.03  CALCIUM 9.1  --   --  8.7*   < > = values in this interval not displayed.   PT/INR No results for input(s): LABPROT, INR in the last 72 hours. CMP     Component Value Date/Time   NA 144 09/05/2019 0317   NA 145 (H) 05/02/2018 1400   K 4.0 09/05/2019 0317   CL 105 09/05/2019 0317   CO2 25 09/05/2019 0317   GLUCOSE 126 (H) 09/05/2019 0317   BUN 22  09/05/2019 0317   BUN 22 05/02/2018 1400   CREATININE 1.03 09/05/2019 0317   CALCIUM 8.7 (L) 09/05/2019 0317   PROT 7.2 09/04/2019 1220   PROT 7.0 05/02/2018 1400   ALBUMIN 4.1 09/04/2019 1220   ALBUMIN 4.4 05/02/2018 1400   AST 30 09/04/2019 1220   ALT 21 09/04/2019 1220   ALKPHOS 59 09/04/2019 1220   BILITOT 0.9 09/04/2019 1220   BILITOT 0.4 05/02/2018 1400   GFRNONAA >60 09/05/2019 0317   GFRAA >60 09/05/2019 0317   Lipase     Component Value Date/Time   LIPASE 23 05/04/2013 0155       Studies/Results: CT Head Wo Contrast  Result Date: 09/04/2019 CLINICAL DATA:  Fall today landing on right side. On anticoagulation. EXAM: CT HEAD WITHOUT CONTRAST TECHNIQUE: Contiguous axial images were obtained from the base of the skull through the vertex without intravenous contrast. COMPARISON:  None. FINDINGS: Brain: No intracranial hemorrhage, mass effect, or midline shift. Age related atrophy. No  hydrocephalus. The basilar cisterns are patent. Mild periventricular chronic small vessel ischemia. Possible small remote lacunar infarct in the left cerebellum. No evidence of territorial infarct or acute ischemia. No extra-axial or intracranial fluid collection. Vascular: Dolichoectasia of the vertebrobasilar system. No hyperdense vessel. Skull base atherosclerosis. Skull: No fracture or focal lesion. Sinuses/Orbits: Mucous retention cyst in left maxillary sinus. Paranasal sinuses are otherwise clear. Mastoid air cells are well aerated. Bilateral cataract resection. Other: None. IMPRESSION: 1. No acute intracranial abnormality. No skull fracture. 2. Age related atrophy and chronic small vessel ischemia. Electronically Signed   By: Narda Rutherford M.D.   On: 09/04/2019 13:30   CT Chest W Contrast  Result Date: 09/04/2019 CLINICAL DATA:  Fall, shortness of breath, Eliquis, history of aortic dissection EXAM: CT CHEST, ABDOMEN, AND PELVIS WITH CONTRAST TECHNIQUE: Multidetector CT imaging of the chest,  abdomen and pelvis was performed following the standard protocol during bolus administration of intravenous contrast. CONTRAST:  OMNIPAQUE IOHEXOL 300 MG/ML  SOLN COMPARISON:  CT chest angiogram, 05/02/2018, CT chest abdomen pelvis angiogram, 01/28/2015 FINDINGS: CT CHEST FINDINGS Cardiovascular: Redemonstrated postoperative findings of aortic valve replacement. Redemonstrated stent endograft repair of the aortic arch and descending thoracic aorta, unchanged in contour and caliber. No evidence of endoleak on this single phase examination. Cardiomegaly with extensive 3 vessel coronary artery calcifications and/or stents. No pericardial effusion. Mediastinum/Nodes: No enlarged mediastinal, hilar, or axillary lymph nodes. Thyroid gland, trachea, and esophagus demonstrate no significant findings. Lungs/Pleura: Mild paraseptal and centrilobular emphysema. Stable, benign 7 mm pulmonary nodule of the anterior right upper lobe (series 5, image 49). Trace right pleural effusion. Musculoskeletal: No chest wall mass or suspicious bone lesions identified. Minimally displaced fractures of the posterior right 6, seventh, and eighth ribs. CT ABDOMEN PELVIS FINDINGS Hepatobiliary: No solid liver abnormality is seen. Cholelithiasis. No gallbladder wall thickening, or biliary dilatation. Pancreas: Unremarkable. No pancreatic ductal dilatation or surrounding inflammatory changes. Spleen: Normal in size without significant abnormality. Adrenals/Urinary Tract: Adrenal glands are unremarkable. Heterogeneous soft tissue in fluid attenuation of a exophytic lesion of the superior pole of the right kidney, not significantly changed in size over multiple prior examinations although with new internal soft tissue attenuation and adjacent fat stranding, likely reflecting hemorrhage of a cyst. Bladder is unremarkable. Stomach/Bowel: Stomach is within normal limits. Appendix appears normal. No evidence of bowel wall thickening, distention,  or inflammatory changes. Vascular/Lymphatic: Aortic atherosclerosis. Aneurysm of the upper abdominal aorta contiguous with the aneurysmal descending thoracic aorta measuring up to 4.3 x 4.2 cm, not significantly changed. Infrarenal aneurysm of the abdominal aorta measuring up to 3.0 x 3.0 cm, not significantly changed. No enlarged abdominal or pelvic lymph nodes. Reproductive: Mild prostatomegaly. Other: Large left inguinal hernia containing multiple loops of small bowel and sigmoid colon. No abdominopelvic ascites. Musculoskeletal: No acute or significant osseous findings. IMPRESSION: 1. Minimally displaced fractures of the posterior right sixth, seventh, and eighth ribs. Trace right pleural effusion. No pneumothorax. 2. No evidence of acute traumatic injury to the organs of the chest, abdomen or pelvis. 3. Stable appearance of stent endograft repair of the aortic arch and descending thoracic aorta. No evidence of endoleak on this single phase examination. 4. 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 5. 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 Aortic Atherosclerosis (ICD10-I70.0). 6. Coronary artery disease. 7.  Emphysema (ICD10-J43.9). 8. Cholelithiasis. 9. Large left inguinal hernia  containing multiple loops of small bowel and sigmoid colon. Electronically Signed   By: Lauralyn Primes M.D.   On: 09/04/2019 13:45   CT Cervical Spine Wo Contrast  Result Date: 09/04/2019 CLINICAL DATA:  Fall today landing on right side. On anticoagulation. EXAM: CT CERVICAL SPINE WITHOUT CONTRAST TECHNIQUE: Multidetector CT imaging of the cervical spine was performed without intravenous contrast. Multiplanar CT image reconstructions were also generated. COMPARISON:  None. FINDINGS: Alignment: Straightening of normal lordosis. Trace anterolisthesis of C3 on C4 is  likely degenerative and facet mediated. No traumatic subluxation. Skull base and vertebrae: No acute fracture. Vertebral body heights are maintained. The dens and skull base are intact. Soft tissues and spinal canal: No prevertebral fluid or swelling. No visible canal hematoma. Disc levels: Diffuse disc space narrowing and endplate spurring. Multilevel facet hypertrophy. No high-grade canal stenosis. Upper chest: Assessed on concurrent chest CT, reported separately. Other: Carotid atherosclerosis. IMPRESSION: Multilevel degenerative disc disease and facet hypertrophy. No acute fracture or traumatic subluxation of the cervical spine. Electronically Signed   By: Narda Rutherford M.D.   On: 09/04/2019 13:34   CT ABDOMEN PELVIS W CONTRAST  Result Date: 09/04/2019 CLINICAL DATA:  Fall, shortness of breath, Eliquis, history of aortic dissection EXAM: CT CHEST, ABDOMEN, AND PELVIS WITH CONTRAST TECHNIQUE: Multidetector CT imaging of the chest, abdomen and pelvis was performed following the standard protocol during bolus administration of intravenous contrast. CONTRAST:  OMNIPAQUE IOHEXOL 300 MG/ML  SOLN COMPARISON:  CT chest angiogram, 05/02/2018, CT chest abdomen pelvis angiogram, 01/28/2015 FINDINGS: CT CHEST FINDINGS Cardiovascular: Redemonstrated postoperative findings of aortic valve replacement. Redemonstrated stent endograft repair of the aortic arch and descending thoracic aorta, unchanged in contour and caliber. No evidence of endoleak on this single phase examination. Cardiomegaly with extensive 3 vessel coronary artery calcifications and/or stents. No pericardial effusion. Mediastinum/Nodes: No enlarged mediastinal, hilar, or axillary lymph nodes. Thyroid gland, trachea, and esophagus demonstrate no significant findings. Lungs/Pleura: Mild paraseptal and centrilobular emphysema. Stable, benign 7 mm pulmonary nodule of the anterior right upper lobe (series 5, image 49). Trace right pleural effusion.  Musculoskeletal: No chest wall mass or suspicious bone lesions identified. Minimally displaced fractures of the posterior right 6, seventh, and eighth ribs. CT ABDOMEN PELVIS FINDINGS Hepatobiliary: No solid liver abnormality is seen. Cholelithiasis. No gallbladder wall thickening, or biliary dilatation. Pancreas: Unremarkable. No pancreatic ductal dilatation or surrounding inflammatory changes. Spleen: Normal in size without significant abnormality. Adrenals/Urinary Tract: Adrenal glands are unremarkable. Heterogeneous soft tissue in fluid attenuation of a exophytic lesion of the superior pole of the right kidney, not significantly changed in size over multiple prior examinations although with new internal soft tissue attenuation and adjacent fat stranding, likely reflecting hemorrhage of a cyst. Bladder is unremarkable. Stomach/Bowel: Stomach is within normal limits. Appendix appears normal. No evidence of bowel wall thickening, distention, or inflammatory changes. Vascular/Lymphatic: Aortic atherosclerosis. Aneurysm of the upper abdominal aorta contiguous with the aneurysmal descending thoracic aorta measuring up to 4.3 x 4.2 cm, not significantly changed. Infrarenal aneurysm of the abdominal aorta measuring up to 3.0 x 3.0 cm, not significantly changed. No enlarged abdominal or pelvic lymph nodes. Reproductive: Mild prostatomegaly. Other: Large left inguinal hernia containing multiple loops of small bowel and sigmoid colon. No abdominopelvic ascites. Musculoskeletal: No acute or significant osseous findings. IMPRESSION: 1. Minimally displaced fractures of the posterior right sixth, seventh, and eighth ribs. Trace right pleural effusion. No pneumothorax. 2. No evidence of acute traumatic injury to the organs of the chest, abdomen or  pelvis. 3. Stable appearance of stent endograft repair of the aortic arch and descending thoracic aorta. No evidence of endoleak on this single phase examination. 4. Stable aneurysmal  dilatation of the upper abdominal aorta contiguous with the aneurysmal descending thoracic aorta measuring up to 4.3 cm, not significantly changed. 5. 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 Aortic Atherosclerosis (ICD10-I70.0). 6. Coronary artery disease. 7.  Emphysema (ICD10-J43.9). 8. Cholelithiasis. 9. Large left inguinal hernia containing multiple loops of small bowel and sigmoid colon. Electronically Signed   By: Eddie Candle M.D.   On: 09/04/2019 13:45   DG Chest Port 1 View  Result Date: 09/05/2019 CLINICAL DATA:  Recent fall with rib trauma and shortness of breath EXAM: PORTABLE CHEST 1 VIEW COMPARISON:  September 04, 2019 chest radiograph and chest CT FINDINGS: There is persistent consolidation in the left lower lobe with effusion. No new opacity evident. Heart is enlarged, stable. There is an aortic stent graft which appears grossly stable. 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 Fractures of the posterior right seventh and eighth rib fractures noted. No pneumothorax. IMPRESSION: Persistent consolidation with effusion left base. No new opacity evident. Stable cardiac silhouette. Aortic stent graft. Rib fractures on the right without appreciable pneumothorax. Bones appear somewhat osteoporotic. Electronically Signed   By: Lowella Grip III M.D.   On: 09/05/2019 07:59   DG Chest Portable 1 View  Result Date: 09/04/2019 CLINICAL DATA:  Fall. Patient on blood thinners. Shortness of breath. EXAM: PORTABLE CHEST 1 VIEW COMPARISON:  CT 05/02/2018. FINDINGS: Prior median sternotomy. Aortic stent graft appears stable. Stable cardiomegaly with bilateral interstitial prominence suggesting CHF. Pneumonitis cannot be excluded. Small bilateral pleural effusions cannot be excluded. No pneumothorax. No acute bony abnormality identified. IMPRESSION: Prior median sternotomy. Aortic stent graft appears stable. Cardiomegaly with bilateral interstitial prominence suggesting  CHF. Pneumonitis can not be excluded. Small bilateral pleural effusions cannot be excluded. Electronically Signed   By: Marcello Moores  Register   On: 09/04/2019 12:48    Anti-infectives: Anti-infectives (From admission, onward)   None       Assessment/Plan GLF Right posterior 6,7, and 8th rib fx with trace pleural effusion - pain control, PT/OT.  IS and pulm toilet.  Repeat CXR is stable CAD - resume home meds HLD - resume home meds Chronically incarcerated but not strangulated left inguinal hernia - will need to delay surgery that was scheduled for next week given rib fractures.  This will impede his ability to breath and get over that surgery as well as being on a ventilator during surgery. CHF - resume home meds FEN - regular diet VTE - eliquis ID - none Dispo - therapies and pain control.  Adjust pain meds today   LOS: 1 day    Henreitta Cea , [facility] Surgery 09/05/2019, 9:56 AM Please see Amion for pager number during day hours 7:00am-4:30pm or 7:00am -11:30am on weekends

## 2019-09-05 NOTE — Plan of Care (Signed)
  Problem: Education: Goal: Knowledge of General Education information will improve Description: Including pain rating scale, medication(s)/side effects and non-pharmacologic comfort measures Outcome: Progressing   Problem: Health Behavior/Discharge Planning: Goal: Ability to manage health-related needs will improve Outcome: Progressing   Problem: Clinical Measurements: Goal: Respiratory complications will improve Outcome: Progressing   Problem: Activity: Goal: Risk for activity intolerance will decrease Outcome: Progressing   Problem: Coping: Goal: Level of anxiety will decrease Outcome: Progressing   Problem: Elimination: Goal: Will not experience complications related to bowel motility Outcome: Progressing   Problem: Safety: Goal: Ability to remain free from injury will improve Outcome: Progressing

## 2019-09-05 NOTE — Plan of Care (Signed)
  Problem: Clinical Measurements: Goal: Respiratory complications will improve 09/05/2019 0222 by Martyn Ehrich, RN Outcome: Progressing 09/05/2019 0221 by Martyn Ehrich, RN Outcome: Progressing

## 2019-09-05 NOTE — Evaluation (Signed)
Physical Therapy Evaluation Patient Details Name: [name] [name] MRN: [id number] DOB: 1940/10/07 Today's Date: 09/05/2019   History of Present Illness  79 yo admitted after falling getting into his car with right 6-8 rib fx. PMhx: Aflutter, CAD, CHF, HTN, HLD, inguinal hernia  Clinical Impression  Pt pleasant and willing to mobilize but reports pain limiting all aspects of function despite some medication ahead of session. Pt with 8/10 pain with activity limiting all function and limiting mobility to stand pivot. Pt educated for IS use achieving 750cc, as well as splinting with pillow for mobility and coughing. PT with decreased transfers, gait and function who will benefit from acute therapy to maximize independence and function for D/C.  SpO2 94% on RA    Follow Up Recommendations Home health PT;SNF;Supervision/Assistance - 24 hour(pending mobility progression and family assist)    Equipment Recommendations  None recommended by PT    Recommendations for Other Services OT consult     Precautions / Restrictions Precautions Precautions: Fall      Mobility  Bed Mobility Overal bed mobility: Needs Assistance Bed Mobility: Rolling;Sidelying to Sit Rolling: Mod assist Sidelying to sit: Mod assist       General bed mobility comments: cues for sequence with physical assist to roll and elevate trunk, cues for use of pillow for splinting  Transfers Overall transfer level: Needs assistance   Transfers: Sit to/from Stand Sit to Stand: Mod assist         General transfer comment: mod assist to rise from bed with LUE hooking x 2 trials and pivot to chair. Pt initially unable to maintain standing and returned to eOB.  Ambulation/Gait             General Gait Details: unable due to pain  Stairs            Wheelchair Mobility    Modified Rankin (Stroke Patients Only)       Balance Overall balance assessment: Needs assistance   Sitting balance-Leahy Scale:  Good     Standing balance support: Single extremity supported Standing balance-Leahy Scale: Poor Standing balance comment: UE support in standing                             Pertinent Vitals/Pain Pain Assessment: 0-10 Pain Score: 8  Pain Location: right chest Pain Descriptors / Indicators: Aching;Sore Pain Intervention(s): Limited activity within patient's tolerance;Monitored during session;Repositioned;RN gave pain meds during session    Home Living Family/patient expects to be discharged to:: Private residence Living Arrangements: Spouse/significant other Available Help at Discharge: Family;Available 24 hours/day Type of Home: Mobile home Home Access: Stairs to enter Entrance Stairs-Rails: Right Entrance Stairs-Number of Steps: 3 Home Layout: One level Home Equipment: Walker - 2 wheels;Cane - single point;Shower seat      Prior Function Level of Independence: Independent               Hand Dominance        Extremity/Trunk Assessment   Upper Extremity Assessment Upper Extremity Assessment: RUE deficits/detail RUE Deficits / Details: decreased ROM due to pain RUE: Unable to fully assess due to pain    Lower Extremity Assessment Lower Extremity Assessment: Generalized weakness    Cervical / Trunk Assessment Cervical / Trunk Assessment: Normal  Communication   Communication: No difficulties  Cognition Arousal/Alertness: Awake/alert Behavior During Therapy: WFL for tasks assessed/performed Overall Cognitive Status: Within Functional Limits for tasks assessed  General Comments      Exercises     Assessment/Plan    PT Assessment Patient needs continued PT services  PT Problem List Decreased mobility;Decreased strength;Decreased activity tolerance;Decreased balance;Pain       PT Treatment Interventions DME instruction;Therapeutic exercise;Gait training;Functional mobility  training;Therapeutic activities;Patient/family education;Stair training    PT Goals (Current goals can be found in the Care Plan section)  Acute Rehab PT Goals Patient Stated Goal: return home, jigsaw puzzles, read PT Goal Formulation: With patient Time For Goal Achievement: 09/19/19 Potential to Achieve Goals: Fair    Frequency Min 4X/week   Barriers to discharge Decreased caregiver support      Co-evaluation               AM-PAC PT "6 Clicks" Mobility  Outcome Measure Help needed turning from your back to your side while in a flat bed without using bedrails?: A Lot Help needed moving from lying on your back to sitting on the side of a flat bed without using bedrails?: A Lot Help needed moving to and from a bed to a chair (including a wheelchair)?: A Lot Help needed standing up from a chair using your arms (e.g., wheelchair or bedside chair)?: A Lot Help needed to walk in hospital room?: Total Help needed climbing 3-5 steps with a railing? : Total 6 Click Score: 10    End of Session Equipment Utilized During Treatment: Gait belt Activity Tolerance: Patient tolerated treatment well Patient left: in chair;with call bell/phone within reach;with chair alarm set Nurse Communication: Mobility status;Precautions PT Visit Diagnosis: Other abnormalities of gait and mobility (R26.89);Difficulty in walking, not elsewhere classified (R26.2)    Time: 2637-8588 PT Time Calculation (min) (ACUTE ONLY): 33 min   Charges:   PT Evaluation $PT Eval Moderate Complexity: 1 Mod PT Treatments $Therapeutic Activity: 8-22 mins        Lynessa Almanzar P, PT Acute Rehabilitation Services Pager: [phone] Office: [phone]   Coy Rochford B Jolayne Branson 09/05/2019, 1:25 PM

## 2019-09-05 NOTE — Plan of Care (Signed)
  Problem: Clinical Measurements: Goal: Ability to maintain clinical measurements within normal limits will improve Outcome: Progressing Goal: Respiratory complications will improve Outcome: Progressing Goal: Cardiovascular complication will be avoided Outcome: Progressing   Problem: Activity: Goal: Risk for activity intolerance will decrease Outcome: Progressing   Problem: Coping: Goal: Level of anxiety will decrease Outcome: Progressing   Problem: Elimination: Goal: Will not experience complications related to bowel motility Outcome: Progressing   Problem: Pain Managment: Goal: General experience of comfort will improve Outcome: Progressing

## 2019-09-06 LAB — CBC
HCT: 30.6 % — ABNORMAL LOW (ref 39.0–52.0)
Hemoglobin: 8.9 g/dL — ABNORMAL LOW (ref 13.0–17.0)
MCH: 26.6 pg (ref 26.0–34.0)
MCHC: 29.1 g/dL — ABNORMAL LOW (ref 30.0–36.0)
MCV: 91.3 fL (ref 80.0–100.0)
Platelets: 296 10*3/uL (ref 150–400)
RBC: 3.35 MIL/uL — ABNORMAL LOW (ref 4.22–5.81)
RDW: 17.2 % — ABNORMAL HIGH (ref 11.5–15.5)
WBC: 6.7 10*3/uL (ref 4.0–10.5)
nRBC: 0 % (ref 0.0–0.2)

## 2019-09-06 NOTE — Progress Notes (Signed)
Patient ID: [name] [name], male   DOB: 10-05-1940, 79 y.o.   MRN: [id number] [facility] Surgery Progress Note:   * No surgery found *  Subjective: Mental status is clear;  Shallow restricted breathing on the right side from posterior rib fractures Objective: Vital signs in last 24 hours: Temp:  [98.3 F (36.8 C)-98.5 F (36.9 C)] 98.5 F (36.9 C) (03/20 0318) Pulse Rate:  [90-98] 90 (03/20 0318) Resp:  [15-18] 18 (03/20 0318) BP: (117-135)/(61-73) 135/73 (03/20 0318) SpO2:  [94 %-97 %] 94 % (03/20 0318)  Intake/Output from previous day: 03/19 0701 - 03/20 0700 In: -  Out: 1000 [Urine:1000] Intake/Output this shift: Total I/O In: 240 [P.O.:240] Out: 100 [Urine:100]  Physical Exam: Work of breathing is not labored but shallow.  BS slightly diminished on the right  Lab Results:  Results for orders placed or performed during the hospital encounter of 09/04/19 (from the past 48 hour(s))  CBC with Differential     Status: Abnormal   Collection Time: 09/04/19 12:20 PM  Result Value Ref Range   WBC 8.2 4.0 - 10.5 K/uL   RBC 3.70 (L) 4.22 - 5.81 MIL/uL   Hemoglobin 9.8 (L) 13.0 - 17.0 g/dL   HCT 35.7 (L) 39.0 - 52.0 %   MCV 96.5 80.0 - 100.0 fL   MCH 26.5 26.0 - 34.0 pg   MCHC 27.5 (L) 30.0 - 36.0 g/dL   RDW 16.7 (H) 11.5 - 15.5 %   Platelets 300 150 - 400 K/uL   nRBC 0.0 0.0 - 0.2 %   Neutrophils Relative % 80 %   Neutro Abs 6.6 1.7 - 7.7 K/uL   Lymphocytes Relative 9 %   Lymphs Abs 0.7 0.7 - 4.0 K/uL   Monocytes Relative 8 %   Monocytes Absolute 0.6 0.1 - 1.0 K/uL   Eosinophils Relative 2 %   Eosinophils Absolute 0.1 0.0 - 0.5 K/uL   Basophils Relative 0 %   Basophils Absolute 0.0 0.0 - 0.1 K/uL   Immature Granulocytes 1 %   Abs Immature Granulocytes 0.07 0.00 - 0.07 K/uL    Comment: Performed at [facility], [address] [place], [place] [address]  Comprehensive metabolic panel     Status: Abnormal   Collection Time: 09/04/19 12:20 PM  Result Value  Ref Range   Sodium 144 135 - 145 mmol/L   Potassium 4.3 3.5 - 5.1 mmol/L   Chloride 106 98 - 111 mmol/L   CO2 22 22 - 32 mmol/L   Glucose, Bld 117 (H) 70 - 99 mg/dL    Comment: Glucose reference range applies only to samples taken after fasting for at least 8 hours.   BUN 25 (H) 8 - 23 mg/dL   Creatinine, Ser 1.11 0.61 - 1.24 mg/dL   Calcium 9.1 8.9 - 10.3 mg/dL   Total Protein 7.2 6.5 - 8.1 g/dL   Albumin 4.1 3.5 - 5.0 g/dL   AST 30 15 - 41 U/L   ALT 21 0 - 44 U/L   Alkaline Phosphatase 59 38 - 126 U/L   Total Bilirubin 0.9 0.3 - 1.2 mg/dL   GFR calc non Af Amer >60 >60 mL/min   GFR calc Af Amer >60 >60 mL/min   Anion gap 16 (H) 5 - 15    Comment: Performed at [facility], [place] [address], [place], [place] [address]  I-stat chem 8, ED (not at [facility] or [facility])     Status: Abnormal  Collection Time: 09/04/19 12:51 PM  Result Value Ref Range   Sodium 143 135 - 145 mmol/L   Potassium 3.8 3.5 - 5.1 mmol/L   Chloride 106 98 - 111 mmol/L   BUN 23 8 - 23 mg/dL   Creatinine, Ser 6.57 0.61 - 1.24 mg/dL   Glucose, Bld 846 (H) 70 - 99 mg/dL    Comment: Glucose reference range applies only to samples taken after fasting for at least 8 hours.   Calcium, Ion 1.11 (L) 1.15 - 1.40 mmol/L   TCO2 28 22 - 32 mmol/L   Hemoglobin 9.9 (L) 13.0 - 17.0 g/dL   HCT 96.2 (L) 95.2 - 84.1 %  Type and screen     Status: None   Collection Time: 09/04/19  2:14 PM  Result Value Ref Range   ABO/RH(D) A POS    Antibody Screen NEG    Sample Expiration      09/07/2019,2359 Performed at [facility], [address], [place], [place] [address]   SARS CORONAVIRUS 2 (TAT 6-24 HRS) Nasopharyngeal Nasopharyngeal Swab     Status: None   Collection Time: 09/04/19  2:24 PM   Specimen: Nasopharyngeal Swab  Result Value Ref Range   SARS Coronavirus 2 NEGATIVE NEGATIVE    Comment: (NOTE) SARS-CoV-2 target nucleic acids are NOT DETECTED. The SARS-CoV-2 RNA is generally detectable in upper and  lower respiratory specimens during the acute phase of infection. Negative results do not preclude SARS-CoV-2 infection, do not rule out co-infections with other pathogens, and should not be used as the sole basis for treatment or other patient management decisions. Negative results must be combined with clinical observations, patient history, and epidemiological information. The expected result is Negative. Fact Sheet for Patients: HairSlick.no Fact Sheet for Healthcare Providers: quierodirigir.com This test is not yet approved or cleared by the [place] FDA and  has been authorized for detection and/or diagnosis of SARS-CoV-2 by FDA under an Emergency Use Authorization (EUA). This EUA will remain  in effect (meaning this test can be used) for the duration of the COVID-19 declaration under Section 56 4(b)(1) of the Act, 21 U.S.C. section 360bbb-3(b)(1), unless the authorization is terminated or revoked sooner. Performed at [facility], [address] [place], [place] [address]   CBC     Status: Abnormal   Collection Time: 09/05/19  3:17 AM  Result Value Ref Range   WBC 7.0 4.0 - 10.5 K/uL   RBC 3.22 (L) 4.22 - 5.81 MIL/uL   Hemoglobin 8.5 (L) 13.0 - 17.0 g/dL   HCT 53.6 (L) 64.4 - 03.4 %   MCV 92.2 80.0 - 100.0 fL   MCH 26.4 26.0 - 34.0 pg   MCHC 28.6 (L) 30.0 - 36.0 g/dL   RDW 74.2 (H) 59.5 - 63.8 %   Platelets 261 150 - 400 K/uL   nRBC 0.0 0.0 - 0.2 %    Comment: Performed at [facility], [address], [place], [place] [address]  Basic metabolic panel     Status: Abnormal   Collection Time: 09/05/19  3:17 AM  Result Value Ref Range   Sodium 144 135 - 145 mmol/L   Potassium 4.0 3.5 - 5.1 mmol/L   Chloride 105 98 - 111 mmol/L   CO2 25 22 - 32 mmol/L   Glucose, Bld 126 (H) 70 - 99 mg/dL    Comment: Glucose reference range applies only to samples taken after fasting for at least 8 hours.  BUN 22 8 -  23 mg/dL   Creatinine, Ser 7.82 0.61 - 1.24 mg/dL   Calcium 8.7 (L) 8.9 - 10.3 mg/dL   GFR calc non Af Amer >60 >60 mL/min   GFR calc Af Amer >60 >60 mL/min   Anion gap 14 5 - 15    Comment: Performed at [facility], [address] [place], [place] [address]  CBC     Status: Abnormal   Collection Time: 09/06/19  3:53 AM  Result Value Ref Range   WBC 6.7 4.0 - 10.5 K/uL   RBC 3.35 (L) 4.22 - 5.81 MIL/uL   Hemoglobin 8.9 (L) 13.0 - 17.0 g/dL   HCT 30.8 (L) 65.7 - 84.6 %   MCV 91.3 80.0 - 100.0 fL   MCH 26.6 26.0 - 34.0 pg   MCHC 29.1 (L) 30.0 - 36.0 g/dL   RDW 96.2 (H) 95.2 - 84.1 %   Platelets 296 150 - 400 K/uL   nRBC 0.0 0.0 - 0.2 %    Comment: Performed at [facility], [address] [place], [place] [address]    Radiology/Results: CT Head Wo Contrast  Result Date: 09/04/2019 CLINICAL DATA:  Fall today landing on right side. On anticoagulation. EXAM: CT HEAD WITHOUT CONTRAST TECHNIQUE: Contiguous axial images were obtained from the base of the skull through the vertex without intravenous contrast. COMPARISON:  None. FINDINGS: Brain: No intracranial hemorrhage, mass effect, or midline shift. Age related atrophy. No hydrocephalus. The basilar cisterns are patent. Mild periventricular chronic small vessel ischemia. Possible small remote lacunar infarct in the left cerebellum. No evidence of territorial infarct or acute ischemia. No extra-axial or intracranial fluid collection. Vascular: Dolichoectasia of the vertebrobasilar system. No hyperdense vessel. Skull base atherosclerosis. Skull: No fracture or focal lesion. Sinuses/Orbits: Mucous retention cyst in left maxillary sinus. Paranasal sinuses are otherwise clear. Mastoid air cells are well aerated. Bilateral cataract resection. Other: None. IMPRESSION: 1. No acute intracranial abnormality. No skull fracture. 2. Age related atrophy and chronic small vessel ischemia. Electronically Signed   By: Narda Rutherford M.D.   On:  09/04/2019 13:30   CT Chest W Contrast  Result Date: 09/04/2019 CLINICAL DATA:  Fall, shortness of breath, Eliquis, history of aortic dissection EXAM: CT CHEST, ABDOMEN, AND PELVIS WITH CONTRAST TECHNIQUE: Multidetector CT imaging of the chest, abdomen and pelvis was performed following the standard protocol during bolus administration of intravenous contrast. CONTRAST:  OMNIPAQUE IOHEXOL 300 MG/ML  SOLN COMPARISON:  CT chest angiogram, 05/02/2018, CT chest abdomen pelvis angiogram, 01/28/2015 FINDINGS: CT CHEST FINDINGS Cardiovascular: Redemonstrated postoperative findings of aortic valve replacement. Redemonstrated stent endograft repair of the aortic arch and descending thoracic aorta, unchanged in contour and caliber. No evidence of endoleak on this single phase examination. Cardiomegaly with extensive 3 vessel coronary artery calcifications and/or stents. No pericardial effusion. Mediastinum/Nodes: No enlarged mediastinal, hilar, or axillary lymph nodes. Thyroid gland, trachea, and esophagus demonstrate no significant findings. Lungs/Pleura: Mild paraseptal and centrilobular emphysema. Stable, benign 7 mm pulmonary nodule of the anterior right upper lobe (series 5, image 49). Trace right pleural effusion. Musculoskeletal: No chest wall mass or suspicious bone lesions identified. Minimally displaced fractures of the posterior right 6, seventh, and eighth ribs. CT ABDOMEN PELVIS FINDINGS Hepatobiliary: No solid liver abnormality is seen. Cholelithiasis. No gallbladder wall thickening, or biliary dilatation. Pancreas: Unremarkable. No pancreatic ductal dilatation or surrounding inflammatory changes. Spleen: Normal in size without significant abnormality. Adrenals/Urinary Tract: Adrenal glands are unremarkable. Heterogeneous soft tissue  in fluid attenuation of a exophytic lesion of the superior pole of the right kidney, not significantly changed in size over multiple prior examinations although with new  internal soft tissue attenuation and adjacent fat stranding, likely reflecting hemorrhage of a cyst. Bladder is unremarkable. Stomach/Bowel: Stomach is within normal limits. Appendix appears normal. No evidence of bowel wall thickening, distention, or inflammatory changes. Vascular/Lymphatic: Aortic atherosclerosis. Aneurysm of the upper abdominal aorta contiguous with the aneurysmal descending thoracic aorta measuring up to 4.3 x 4.2 cm, not significantly changed. Infrarenal aneurysm of the abdominal aorta measuring up to 3.0 x 3.0 cm, not significantly changed. No enlarged abdominal or pelvic lymph nodes. Reproductive: Mild prostatomegaly. Other: Large left inguinal hernia containing multiple loops of small bowel and sigmoid colon. No abdominopelvic ascites. Musculoskeletal: No acute or significant osseous findings. IMPRESSION: 1. Minimally displaced fractures of the posterior right sixth, seventh, and eighth ribs. Trace right pleural effusion. No pneumothorax. 2. No evidence of acute traumatic injury to the organs of the chest, abdomen or pelvis. 3. Stable appearance of stent endograft repair of the aortic arch and descending thoracic aorta. No evidence of endoleak on this single phase examination. 4. 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 5. 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 Aortic Atherosclerosis (ICD10-I70.0). 6. Coronary artery disease. 7.  Emphysema (ICD10-J43.9). 8. Cholelithiasis. 9. Large left inguinal hernia containing multiple loops of small bowel and sigmoid colon. Electronically Signed   By: Lauralyn Primes M.D.   On: 09/04/2019 13:45   CT Cervical Spine Wo Contrast  Result Date: 09/04/2019 CLINICAL DATA:  Fall today landing on right side. On anticoagulation. EXAM: CT CERVICAL SPINE WITHOUT CONTRAST TECHNIQUE:  Multidetector CT imaging of the cervical spine was performed without intravenous contrast. Multiplanar CT image reconstructions were also generated. COMPARISON:  None. FINDINGS: Alignment: Straightening of normal lordosis. Trace anterolisthesis of C3 on C4 is likely degenerative and facet mediated. No traumatic subluxation. Skull base and vertebrae: No acute fracture. Vertebral body heights are maintained. The dens and skull base are intact. Soft tissues and spinal canal: No prevertebral fluid or swelling. No visible canal hematoma. Disc levels: Diffuse disc space narrowing and endplate spurring. Multilevel facet hypertrophy. No high-grade canal stenosis. Upper chest: Assessed on concurrent chest CT, reported separately. Other: Carotid atherosclerosis. IMPRESSION: Multilevel degenerative disc disease and facet hypertrophy. No acute fracture or traumatic subluxation of the cervical spine. Electronically Signed   By: Narda Rutherford M.D.   On: 09/04/2019 13:34   CT ABDOMEN PELVIS W CONTRAST  Result Date: 09/04/2019 CLINICAL DATA:  Fall, shortness of breath, Eliquis, history of aortic dissection EXAM: CT CHEST, ABDOMEN, AND PELVIS WITH CONTRAST TECHNIQUE: Multidetector CT imaging of the chest, abdomen and pelvis was performed following the standard protocol during bolus administration of intravenous contrast. CONTRAST:  OMNIPAQUE IOHEXOL 300 MG/ML  SOLN COMPARISON:  CT chest angiogram, 05/02/2018, CT chest abdomen pelvis angiogram, 01/28/2015 FINDINGS: CT CHEST FINDINGS Cardiovascular: Redemonstrated postoperative findings of aortic valve replacement. Redemonstrated stent endograft repair of the aortic arch and descending thoracic aorta, unchanged in contour and caliber. No evidence of endoleak on this single phase examination. Cardiomegaly with extensive 3 vessel coronary artery calcifications and/or stents. No pericardial effusion. Mediastinum/Nodes: No enlarged mediastinal, hilar, or axillary lymph nodes.  Thyroid gland, trachea, and esophagus demonstrate no significant findings. Lungs/Pleura: Mild paraseptal and centrilobular emphysema. Stable, benign 7 mm pulmonary nodule  of the anterior right upper lobe (series 5, image 49). Trace right pleural effusion. Musculoskeletal: No chest wall mass or suspicious bone lesions identified. Minimally displaced fractures of the posterior right 6, seventh, and eighth ribs. CT ABDOMEN PELVIS FINDINGS Hepatobiliary: No solid liver abnormality is seen. Cholelithiasis. No gallbladder wall thickening, or biliary dilatation. Pancreas: Unremarkable. No pancreatic ductal dilatation or surrounding inflammatory changes. Spleen: Normal in size without significant abnormality. Adrenals/Urinary Tract: Adrenal glands are unremarkable. Heterogeneous soft tissue in fluid attenuation of a exophytic lesion of the superior pole of the right kidney, not significantly changed in size over multiple prior examinations although with new internal soft tissue attenuation and adjacent fat stranding, likely reflecting hemorrhage of a cyst. Bladder is unremarkable. Stomach/Bowel: Stomach is within normal limits. Appendix appears normal. No evidence of bowel wall thickening, distention, or inflammatory changes. Vascular/Lymphatic: Aortic atherosclerosis. Aneurysm of the upper abdominal aorta contiguous with the aneurysmal descending thoracic aorta measuring up to 4.3 x 4.2 cm, not significantly changed. Infrarenal aneurysm of the abdominal aorta measuring up to 3.0 x 3.0 cm, not significantly changed. No enlarged abdominal or pelvic lymph nodes. Reproductive: Mild prostatomegaly. Other: Large left inguinal hernia containing multiple loops of small bowel and sigmoid colon. No abdominopelvic ascites. Musculoskeletal: No acute or significant osseous findings. IMPRESSION: 1. Minimally displaced fractures of the posterior right sixth, seventh, and eighth ribs. Trace right pleural effusion. No pneumothorax. 2. No  evidence of acute traumatic injury to the organs of the chest, abdomen or pelvis. 3. Stable appearance of stent endograft repair of the aortic arch and descending thoracic aorta. No evidence of endoleak on this single phase examination. 4. 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 5. 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 Aortic Atherosclerosis (ICD10-I70.0). 6. Coronary artery disease. 7.  Emphysema (ICD10-J43.9). 8. Cholelithiasis. 9. Large left inguinal hernia containing multiple loops of small bowel and sigmoid colon. Electronically Signed   By: Lauralyn Primes M.D.   On: 09/04/2019 13:45   DG Chest Port 1 View  Result Date: 09/05/2019 CLINICAL DATA:  Recent fall with rib trauma and shortness of breath EXAM: PORTABLE CHEST 1 VIEW COMPARISON:  September 04, 2019 chest radiograph and chest CT FINDINGS: There is persistent consolidation in the left lower lobe with effusion. No new opacity evident. Heart is enlarged, stable. There is an aortic stent graft which appears grossly stable. 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 Fractures of the posterior right seventh and eighth rib fractures noted. No pneumothorax. IMPRESSION: Persistent consolidation with effusion left base. No new opacity evident. Stable cardiac silhouette. Aortic stent graft. Rib fractures on the right without appreciable pneumothorax. Bones appear somewhat osteoporotic. Electronically Signed   By: Bretta Bang III M.D.   On: 09/05/2019 07:59   DG Chest Portable 1 View  Result Date: 09/04/2019 CLINICAL DATA:  Fall. Patient on blood thinners. Shortness of breath. EXAM: PORTABLE CHEST 1 VIEW COMPARISON:  CT 05/02/2018. FINDINGS: Prior median sternotomy. Aortic stent graft appears stable. Stable cardiomegaly with bilateral interstitial prominence suggesting CHF. Pneumonitis cannot be  excluded. Small bilateral pleural effusions cannot be excluded. No pneumothorax. No acute bony abnormality identified. IMPRESSION: Prior median sternotomy. Aortic stent graft appears stable. Cardiomegaly with bilateral interstitial prominence suggesting CHF. Pneumonitis can not be excluded. Small bilateral pleural effusions cannot be excluded. Electronically Signed   By: Maisie Fus  Register   On: 09/04/2019 12:48    Anti-infectives:  Anti-infectives (From admission, onward)   None      Assessment/Plan: Problem List: Patient Active Problem List   Diagnosis Date Noted  . Multiple fractures of ribs, right side, init for clos fx 09/04/2019  . Chest pain 01/28/2015  . Near syncope 01/28/2015  . Chronic diastolic heart failure (HCC) 01/28/2015  . HCAP (healthcare-associated pneumonia) 10/11/2013  . Acute respiratory failure with hypoxia (HCC) 10/07/2013  . Shock circulatory (HCC) 10/07/2013  . UTI (urinary tract infection) 10/06/2013  . Recurrent UTI 10/06/2013  . Urinary retention 10/06/2013  . Insomnia 09/04/2013  . Bradycardia 08/23/2013  . Acute on chronic diastolic heart failure (HCC) 08/22/2013  . Atrial fibrillation (HCC) 05/21/2013  . Atrial flutter (HCC)   . Aortic atherosclerosis (HCC) 05/05/2013  . Aortic dissection (HCC) 05/04/2013  . CAD (coronary artery disease)   . S/P AVR (aortic valve replacement)   . Hyperlipidemia   . HTN (hypertension)     Right posterior rib fractures in 79 year old with prior thoracic aneurysms and with large left scrotal hernia to be repaired at a later time by Dr. [name].   * No surgery found *    LOS: 2 days   Matt B. Daphine Deutscher, MD, [facility] [phone] beeper [phone]  09/06/2019 11:04 AM

## 2019-09-06 NOTE — Progress Notes (Signed)
Continued encouragement to use incentive spirometer

## 2019-09-06 NOTE — Progress Notes (Signed)
Occupational Therapy Treatment Patient Details Name: [name] [name] MRN: [id number] DOB: 01/29/1941 Today's Date: 09/06/2019    History of present illness 79 yo admitted after falling getting into his car with right 6-8 rib fx. PMhx: Aflutter, CAD, CHF, HTN, HLD, inguinal hernia; thoracic aneurysm, descending aortic aneurysm, aortic valve insufficiency   OT comments  Pt progressing with OT, able to mobilize OOB to recliner toward L side with Min A @ RW level. Complaining of some "lightheaded feelings". BP 175/77; 154/82 sitting after mobility; HR 99; SpO2 98 RA. Completed incentive spirometer and encouraged to complete every hour with wife - verbalized understanding. Pt states he will have 24/7 assistance at home. If pt continues to progress, feel he will be able to DC home with 24/7 assistance of family. Will continue to follow.   Follow Up Recommendations  Home health OT;SNF;Supervision/Assistance - 24 hour(pending progress)    Equipment Recommendations  3 in 1 bedside commode    Recommendations for Other Services      Precautions / Restrictions Precautions Precautions: Fall       Mobility Bed Mobility Overal bed mobility: Needs Assistance Bed Mobility: Rolling;Sidelying to Sit Rolling: Supervision Sidelying to sit: Min assist       General bed mobility comments: rolled to L side adn required Min A to transition trunk upright. VC for technique; holding pillow with RUE during mobility.  Transfers Overall transfer level: Needs assistance Equipment used: Rolling walker (2 wheeled) Transfers: Sit to/from [employer] Sit to Stand: Min assist Stand pivot transfers: Min assist            Balance     Sitting balance-Leahy Scale: Good       Standing balance-Leahy Scale: Poor                             ADL either performed or assessed with clinical judgement   ADL Overall ADL's : Needs assistance/impaired     Grooming: Wash/dry  hands;Wash/dry face;Set up;Sitting   Upper Body Bathing: Moderate assistance;Sitting   Lower Body Bathing: Maximal assistance;Sitting/lateral leans;Bed level           Toilet Transfer: Minimal assistance;Stand-pivot   Toileting- Clothing Manipulation and Hygiene: Moderate assistance;Sit to/from stand       Functional mobility during ADLs: Minimal assistance;Rolling walker;Cueing for safety General ADL Comments: Limited by pain     Vision       Perception     Praxis      Cognition Arousal/Alertness: Awake/alert Behavior During Therapy: WFL for tasks assessed/performed Overall Cognitive Status: Within Functional Limits for tasks assessed                                          Exercises Exercises: Other exercises Other Exercises Other Exercises: incentive spirometer x 10 - able to pull 750 ml Other Exercises: encouraged seated chair marching adn leg lifts as tolerated   Shoulder Instructions       General Comments      Pertinent Vitals/ Pain       Pain Assessment: 0-10 Pain Score: 6  Pain Location: right chest, ribs  Pain Descriptors / Indicators: Aching;Sharp;Radiating;Grimacing;Guarding Pain Intervention(s): Limited activity within patient's tolerance  Home Living  Prior Functioning/Environment              Frequency  Min 3X/week        Progress Toward Goals  OT Goals(current goals can now be found in the care plan section)  Progress towards OT goals: Progressing toward goals  Acute Rehab OT Goals Patient Stated Goal: to be able to go home OT Goal Formulation: With patient Time For Goal Achievement: 09/19/19 Potential to Achieve Goals: Good ADL Goals Pt Will Perform Grooming: with min assist;standing Pt Will Perform Upper Body Bathing: with set-up;with supervision;sitting Pt Will Perform Lower Body Bathing: with min assist;with adaptive equipment;sit to/from  stand Pt Will Perform Upper Body Dressing: with set-up;sitting Pt Will Perform Lower Body Dressing: with min assist;with adaptive equipment;sit to/from stand Pt Will Transfer to Toilet: with min guard assist;ambulating;regular height toilet;bedside commode;grab bars Pt Will Perform Toileting - Clothing Manipulation and hygiene: with min guard assist;sit to/from stand  Plan Discharge plan remains appropriate;Frequency needs to be updated    Co-evaluation                 AM-PAC OT "6 Clicks" Daily Activity     Outcome Measure   Help from another person eating meals?: None Help from another person taking care of personal grooming?: A Little Help from another person toileting, which includes using toliet, bedpan, or urinal?: A Lot Help from another person bathing (including washing, rinsing, drying)?: A Lot Help from another person to put on and taking off regular upper body clothing?: A Lot Help from another person to put on and taking off regular lower body clothing?: A Lot 6 Click Score: 15    End of Session Equipment Utilized During Treatment: Gait belt;Rolling walker  OT Visit Diagnosis: Pain;Unsteadiness on feet (R26.81) Pain - Right/Left: Right Pain - part of body: (chest/ribs)   Activity Tolerance Patient tolerated treatment well   Patient Left in chair;with call bell/phone within reach;with chair alarm set;with family/visitor present   Nurse Communication Mobility status        Time: 0272-5366 OT Time Calculation (min): 31 min  Charges: OT General Charges $OT Visit: 1 Visit OT Treatments $Self Care/Home Management : 23-37 mins  Maurie Boettcher, OT/L   Acute OT Clinical Specialist [place] Pager [phone] Office [phone]    [facility] 09/06/2019, 3:40 PM

## 2019-09-06 NOTE — Progress Notes (Signed)
Physical Therapy Treatment Patient Details Name: [name] [name] MRN: [id number] DOB: Apr 01, 1941 Today's Date: 09/06/2019    History of Present Illness 79 yo admitted after falling getting into his car with right 6-8 rib fx. PMhx: Aflutter, CAD, CHF, HTN, HLD, inguinal hernia; thoracic aneurysm, descending aortic aneurysm, aortic valve insufficiency    PT Comments    Pt OOB in recliner with wife helping him to complete HEP upon PT arrival, both eager to participate in PT session with focus on progressing ambulation in hopes of d/c home. The pt continues to demo limitations in transfers and ambulation endurance due to pain and above dx, but was able to complete multiple sit-stand transfers as well as 40 ft ambulation with RW in hallway today. The pt will continue to benefit from skilled PT to further progress endurance as well as independence with functional mobility to decrease dependence on assist from wife upon going home. Suspect pt will progress well as he is highly motivated and pain is better controlled.    Follow Up Recommendations  Home health PT;Supervision/Assistance - 24 hour     Equipment Recommendations  None recommended by PT    Recommendations for Other Services       Precautions / Restrictions Precautions Precautions: Fall Restrictions Weight Bearing Restrictions: No    Mobility  Bed Mobility Overal bed mobility: Needs Assistance Bed Mobility: Rolling;Sidelying to Sit Rolling: Supervision Sidelying to sit: Min assist       General bed mobility comments: pt OOB in recliner upon arrival  Transfers Overall transfer level: Needs assistance Equipment used: Rolling walker (2 wheeled) Transfers: Sit to/from Stand Sit to Stand: Min assist;Mod assist Stand pivot transfers: Min assist       General transfer comment: initially modA to stand from recliner, progressed to minA with VCs for hand placement with reps through  session  Ambulation/Gait Ambulation/Gait assistance: Min guard Gait Distance (Feet): 40 Feet Assistive device: Rolling walker (2 wheeled) Gait Pattern/deviations: Step-through pattern;Decreased stride length;Trunk flexed   Gait velocity interpretation: <1.8 ft/sec, indicate of risk for recurrent falls General Gait Details: pt with slow but steady ambulation, minimal conversation during ambulation cues to focus on breathing, no LOB. HR 126 after 40 ft ambulation, SpO2 remained >93% on RA   Stairs             Wheelchair Mobility    Modified Rankin (Stroke Patients Only)       Balance Overall balance assessment: Needs assistance Sitting-balance support: Feet supported Sitting balance-Leahy Scale: Good Sitting balance - Comments: able to maintain static sitting with supervision   Standing balance support: No upper extremity supported Standing balance-Leahy Scale: Fair Standing balance comment: static standing without AD, BUE supported for ambulation                            Cognition Arousal/Alertness: Awake/alert Behavior During Therapy: WFL for tasks assessed/performed Overall Cognitive Status: Within Functional Limits for tasks assessed                                 General Comments: pt very pleasant and cooperative, big Academic librarian      Exercises Other Exercises Other Exercises: incentive spirometer x 10 - able to pull 750 ml Other Exercises: encouraged seated chair marching adn leg lifts as tolerated    General Comments        Pertinent Vitals/Pain Pain  Assessment: 0-10 Pain Score: 5  Pain Location: right chest, ribs  Pain Descriptors / Indicators: Aching;Sharp;Radiating;Grimacing;Guarding Pain Intervention(s): Limited activity within patient's tolerance;Monitored during session;Repositioned    Home Living                      Prior Function            PT Goals (current goals can now be found in the  care plan section) Acute Rehab PT Goals Patient Stated Goal: to be able to go home PT Goal Formulation: With patient Time For Goal Achievement: 09/19/19 Potential to Achieve Goals: Fair Progress towards PT goals: Progressing toward goals    Frequency    Min 4X/week      PT Plan Current plan remains appropriate    Co-evaluation              AM-PAC PT "6 Clicks" Mobility   Outcome Measure  Help needed turning from your back to your side while in a flat bed without using bedrails?: A Little Help needed moving from lying on your back to sitting on the side of a flat bed without using bedrails?: A Little Help needed moving to and from a bed to a chair (including a wheelchair)?: A Little Help needed standing up from a chair using your arms (e.g., wheelchair or bedside chair)?: A Little Help needed to walk in hospital room?: A Little Help needed climbing 3-5 steps with a railing? : A Lot 6 Click Score: 17    End of Session Equipment Utilized During Treatment: Gait belt Activity Tolerance: Patient tolerated treatment well Patient left: in chair;with call bell/phone within reach;with chair alarm set;with family/visitor present Nurse Communication: Mobility status;Precautions PT Visit Diagnosis: Other abnormalities of gait and mobility (R26.89);Difficulty in walking, not elsewhere classified (R26.2)     Time: 1740-8144 PT Time Calculation (min) (ACUTE ONLY): 27 min  Charges:  $Gait Training: 23-37 mins                     Karma Ganja, PT, DPT   Acute Rehabilitation Department Pager #: [phone]   Otho Bellows 09/06/2019, 4:19 PM

## 2019-09-07 LAB — URINALYSIS, ROUTINE W REFLEX MICROSCOPIC
Bacteria, UA: NONE SEEN
Bilirubin Urine: NEGATIVE
Glucose, UA: NEGATIVE mg/dL
Ketones, ur: NEGATIVE mg/dL
Leukocytes,Ua: NEGATIVE
Nitrite: NEGATIVE
Protein, ur: NEGATIVE mg/dL
RBC / HPF: >50 RBC/hpf — ABNORMAL HIGH (ref 0–5)
Specific Gravity, Urine: 1.01 (ref 1.005–1.030)
pH: 6 (ref 5.0–8.0)

## 2019-09-07 NOTE — Progress Notes (Signed)
Notified by patient's wife that urine contained blood. She is concerned due to a history of sepsis related to a UTI. Some drops of blood noted in the toilet. Dr. Bedelia Person notified at 1730 and urinalysis ordered. Urinalysis sent. Will continue to monitor patient. Peggye Ley, RN

## 2019-09-07 NOTE — Progress Notes (Signed)
Patient ID: [name] [name], male   DOB: [birth year], 79 y.o.   MRN: [id number] [facility] Progress Note:   * No surgery found *  Subjective: Mental status is clear Objective: Vital signs in last 24 hours: Temp:  [97.9 F (36.6 C)-98.3 F (36.8 C)] 98.1 F (36.7 C) (03/21 0902) Pulse Rate:  [87-120] 99 (03/21 0902) Resp:  [15-17] 16 (03/21 0902) BP: (103-154)/(66-70) 125/67 (03/21 0902) SpO2:  [95 %-98 %] 97 % (03/21 0902)  Intake/Output from previous day: 03/20 0701 - 03/21 0700 In: 480 [P.O.:480] Out: 550 [Urine:550] Intake/Output this shift: Total I/O In: 360 [P.O.:360] Out: 200 [Urine:200]  Physical Exam: Work of breathing is not labored  Lab Results:  Results for orders placed or performed during the hospital encounter of 09/04/19 (from the past 48 hour(s))  CBC     Status: Abnormal   Collection Time: 09/06/19  3:53 AM  Result Value Ref Range   WBC 6.7 4.0 - 10.5 K/uL   RBC 3.35 (L) 4.22 - 5.81 MIL/uL   Hemoglobin 8.9 (L) 13.0 - 17.0 g/dL   HCT 46.5 (L) 68.1 - 27.5 %   MCV 91.3 80.0 - 100.0 fL   MCH 26.6 26.0 - 34.0 pg   MCHC 29.1 (L) 30.0 - 36.0 g/dL   RDW 17.0 (H) 01.7 - 49.4 %   Platelets 296 150 - 400 K/uL   nRBC 0.0 0.0 - 0.2 %    Comment: Performed at [facility], [address], [place], [place] [address]    Radiology/Results: No results found.  Anti-infectives: Anti-infectives (From admission, onward)   None      Assessment/Plan: Problem List: Patient Active Problem List   Diagnosis Date Noted  . Multiple fractures of ribs, right side, init for clos fx 09/04/2019  . Chest pain 01/28/2015  . Near syncope 01/28/2015  . Chronic diastolic heart failure (HCC) 01/28/2015  . HCAP (healthcare-associated pneumonia) 10/11/2013  . Acute respiratory failure with hypoxia (HCC) 10/07/2013  . Shock circulatory (HCC) 10/07/2013  . UTI (urinary tract infection) 10/06/2013  . Recurrent UTI 10/06/2013  . Urinary retention 10/06/2013  .  Insomnia 09/04/2013  . Bradycardia 08/23/2013  . Acute on chronic diastolic heart failure (HCC) 08/22/2013  . Atrial fibrillation (HCC) 05/21/2013  . Atrial flutter (HCC)   . Aortic atherosclerosis (HCC) 05/05/2013  . Aortic dissection (HCC) 05/04/2013  . CAD (coronary artery disease)   . S/P AVR (aortic valve replacement)   . Hyperlipidemia   . HTN (hypertension)     He walked some yesterday.  Using incentive spirometry.  He will hopefully be ready for discharge tomorrow.   * No surgery found *    LOS: 3 days   Matt B. Daphine Deutscher, MD, [facility] [phone] beeper [phone]  09/07/2019 10:24 AM

## 2019-09-07 NOTE — Progress Notes (Signed)
Occupational Therapy Treatment Patient Details Name: [name] [name] MRN: [id number] DOB: May 22, 1941 Today's Date: 09/07/2019    History of present illness 79 yo admitted after falling getting into his car with right 6-8 rib fx. PMhx: Aflutter, CAD, CHF, HTN, HLD, inguinal hernia; thoracic aneurysm, descending aortic aneurysm, aortic valve insufficiency   OT comments  Making good progress. Educated pt/wife on compensatory strategies for ADL and strategies to reduce risk of falls. Pt able to ambulate @ 264ft with S. Sit - stand with S this pm. Completed incentive spirometer x 10 -pulling 1000 ml. Pt appropriate to DC home with HHOT when medically stable. Will continue to follow acutely.   Follow Up Recommendations  Home health OT;Supervision/Assistance - 24 hour    Equipment Recommendations  3 in 1 bedside commode    Recommendations for Other Services      Precautions / Restrictions Precautions Precautions: Fall Restrictions Weight Bearing Restrictions: No       Mobility Bed Mobility               General bed mobility comments: OOB in chair  Transfers Overall transfer level: Needs assistance Equipment used: Rolling walker (2 wheeled) Transfers: Sit to/from Stand Sit to Stand: Supervision              Balance Overall balance assessment: Needs assistance Sitting-balance support: Feet supported Sitting balance-Leahy Scale: Good Sitting balance - Comments: able to maintain static sitting with supervision   Standing balance support: No upper extremity supported Standing balance-Leahy Scale: Fair Standing balance comment: static standing without AD, BUE supported for ambulation                           ADL either performed or assessed with clinical judgement   ADL                                         General ADL Comments: Educated wife/pt on compensatory strategies. Wife plans to assist pt with dressing/bathing. Recommend use  of shower chair adn elevated commode - wife purchased toilet riser. Educated on need to remove all throw rugs to reduce risk fo falls.     Vision       Perception     Praxis      Cognition Arousal/Alertness: Awake/alert Behavior During Therapy: WFL for tasks assessed/performed Overall Cognitive Status: Within Functional Limits for tasks assessed                                          Exercises Exercises: Other exercises Other Exercises Other Exercises: incentive spirometer x 10 - able to pull 1000 ml Other Exercises: encouraged BUE AROM through full ROM to reduce risk of shoulder immobiliityl RTC insufficiency at baseline   Shoulder Instructions       General Comments      Pertinent Vitals/ Pain       Pain Assessment: Faces Faces Pain Scale: Hurts little more Pain Location: right chest, ribs  Pain Descriptors / Indicators: Aching;Sharp;Radiating;Grimacing;Guarding Pain Intervention(s): Limited activity within patient's tolerance  Home Living  Prior Functioning/Environment              Frequency  Min 3X/week        Progress Toward Goals  OT Goals(current goals can now be found in the care plan section)  Progress towards OT goals: Progressing toward goals  Acute Rehab OT Goals Patient Stated Goal: to be able to go home OT Goal Formulation: With patient Time For Goal Achievement: 09/19/19 Potential to Achieve Goals: Good ADL Goals Pt Will Perform Grooming: with min assist;standing Pt Will Perform Upper Body Bathing: with set-up;with supervision;sitting Pt Will Perform Lower Body Bathing: with min assist;with adaptive equipment;sit to/from stand Pt Will Perform Upper Body Dressing: with set-up;sitting Pt Will Perform Lower Body Dressing: with min assist;with adaptive equipment;sit to/from stand Pt Will Transfer to Toilet: with min guard assist;ambulating;regular height  toilet;bedside commode;grab bars Pt Will Perform Toileting - Clothing Manipulation and hygiene: with min guard assist;sit to/from stand  Plan Discharge plan needs to be updated    Co-evaluation                 AM-PAC OT "6 Clicks" Daily Activity     Outcome Measure   Help from another person eating meals?: None Help from another person taking care of personal grooming?: A Little Help from another person toileting, which includes using toliet, bedpan, or urinal?: A Little Help from another person bathing (including washing, rinsing, drying)?: A Lot Help from another person to put on and taking off regular upper body clothing?: A Lot Help from another person to put on and taking off regular lower body clothing?: A Lot 6 Click Score: 16    End of Session Equipment Utilized During Treatment: Rolling walker  OT Visit Diagnosis: Pain;Unsteadiness on feet (R26.81) Pain - Right/Left: Right Pain - part of body: (chest)   Activity Tolerance Patient tolerated treatment well   Patient Left in chair;with call bell/phone within reach   Nurse Communication Mobility status        Time: 7124-5809 OT Time Calculation (min): 16 min  Charges: OT General Charges $OT Visit: 1 Visit OT Treatments $Self Care/Home Management : 8-22 mins  Luisa Dago, OT/L   Acute OT Clinical Specialist Acute Rehabilitation Services Pager [phone] Office [phone]    [facility] 09/07/2019, 4:53 PM

## 2019-09-07 NOTE — Progress Notes (Signed)
Physical Therapy Treatment Patient Details Name: [name] [name] MRN: [id number] DOB: April 27, 1941 Today's Date: 09/07/2019    History of Present Illness 79 yo admitted after falling getting into his car with right 6-8 rib fx. PMhx: Aflutter, CAD, CHF, HTN, HLD, inguinal hernia; thoracic aneurysm, descending aortic aneurysm, aortic valve insufficiency    PT Comments    Pt demonstrated good progress with PT goals meeting all goals for return to home other than HEP focused on balance.  Pt was able to ambulate much further with 1 standing rest break with RW with CGA progressing to supervision today with no near LOB demonstrated.  Pt performed stairs safely with moderate fatigue requiring seated rest break prior to and following activity (approximately 1-2 min each break). Pt reported good understanding to HEP focused on improved LE strength/activation, plan to education on balance progression tomorrow AM. PT will continue following acutely to address functional deficits listed below for transition to home following d/c.   Follow Up Recommendations  Home health PT;Supervision/Assistance - 24 hour     Equipment Recommendations  None recommended by PT    Recommendations for Other Services OT consult     Precautions / Restrictions Precautions Precautions: Fall Restrictions Weight Bearing Restrictions: No    Mobility  Bed Mobility Overal bed mobility: Needs Assistance Bed Mobility: Rolling;Sidelying to Sit Rolling: Min assist Sidelying to sit: Min guard       General bed mobility comments: Performed bed mobility from flat surface. Pt able to perform log roll with minA for rolling onto L side, limited secondary to R rib pain.  Pt able to sit without any assistance from SL requiring increased time and intermittent VCs for sequencing for improved body mechanics.  Transfers Overall transfer level: Needs assistance Equipment used: Rolling walker (2 wheeled) Transfers: Sit to/from  Stand Sit to Stand: Min assist;Min guard         General transfer comment: Initially requried minA for initial steadiness and VCs for sequencing from a low surface (use LUE for push off).   Ambulation/Gait Ambulation/Gait assistance: Min guard;Supervision Gait Distance (Feet): 165 Feet(standing rest break at 60ft approx 60 seconds) Assistive device: Rolling walker (2 wheeled) Gait Pattern/deviations: Step-through pattern;Decreased stride length;Trunk flexed Gait velocity: decreased   General Gait Details: Pt with slow steady ambulation with minimal UE support between steps. Pt with minimal conversation to focus on breathing, no LOB demonstrated throughout. VSS throughout on RA.   Stairs Stairs: Yes Stairs assistance: Min guard Stair Management: One rail Right;Step to pattern;Sideways Number of Stairs: 6 General stair comments: Pt demonstrated mod-high fatigue following requiring rest break prior to and following stair negotation.      Balance Overall balance assessment: Needs assistance Sitting-balance support: Feet supported Sitting balance-Leahy Scale: Good Sitting balance - Comments: able to maintain static sitting with supervision   Standing balance support: No upper extremity supported Standing balance-Leahy Scale: Fair Standing balance comment: static standing without AD, BUE supported for ambulation            Cognition Arousal/Alertness: Awake/alert Behavior During Therapy: WFL for tasks assessed/performed Overall Cognitive Status: Within Functional Limits for tasks assessed        General Comments: Pt very pleasant and motivated to work. Pt required intermittent VCs for sequencing for improved sit>stand transfers today.      Exercises Other Exercises Other Exercises: incentive spirometer x 10 - able to pull 1000 ml Other Exercises: SLR in supine x 10 BLE Other Exercises: LAQ x 10 BLE  General Comments        Pertinent Vitals/Pain Pain  Assessment: Faces Faces Pain Scale: Hurts little more Pain Location: right chest, ribs  Pain Descriptors / Indicators: Aching;Sharp;Radiating;Grimacing;Guarding Pain Intervention(s): Limited activity within patient's tolerance;Monitored during session;Repositioned           PT Goals (current goals can now be found in the care plan section) Acute Rehab PT Goals Patient Stated Goal: to be able to go home PT Goal Formulation: With patient Time For Goal Achievement: 09/19/19 Potential to Achieve Goals: Fair Progress towards PT goals: Progressing toward goals    Frequency    Min 4X/week      PT Plan Current plan remains appropriate       AM-PAC PT "6 Clicks" Mobility   Outcome Measure  Help needed turning from your back to your side while in a flat bed without using bedrails?: A Little Help needed moving from lying on your back to sitting on the side of a flat bed without using bedrails?: A Little Help needed moving to and from a bed to a chair (including a wheelchair)?: A Little Help needed standing up from a chair using your arms (e.g., wheelchair or bedside chair)?: A Little Help needed to walk in hospital room?: A Little Help needed climbing 3-5 steps with a railing? : A Little 6 Click Score: 18    End of Session Equipment Utilized During Treatment: Gait belt Activity Tolerance: Patient tolerated treatment well Patient left: in chair;with call bell/phone within reach;with chair alarm set;with family/visitor present Nurse Communication: Mobility status;Precautions PT Visit Diagnosis: Other abnormalities of gait and mobility (R26.89);Difficulty in walking, not elsewhere classified (R26.2)     Time: 1130-1206 PT Time Calculation (min) (ACUTE ONLY): 36 min  Charges:  $Gait Training: 8-22 mins $Therapeutic Activity: 8-22 mins                     Ann Held PT, DPT Acute Rehab [facility] Rehabilitation P: [phone]    Capucine Tryon A Cornie Mccomber 09/07/2019, 12:22 PM

## 2019-09-08 ENCOUNTER — Inpatient Hospital Stay (HOSPITAL_COMMUNITY): Payer: Medicare Other

## 2019-09-08 LAB — CBC WITH DIFFERENTIAL/PLATELET
Abs Immature Granulocytes: 0.02 10*3/uL (ref 0.00–0.07)
Basophils Absolute: 0 10*3/uL (ref 0.0–0.1)
Basophils Relative: 0 %
Eosinophils Absolute: 0.3 10*3/uL (ref 0.0–0.5)
Eosinophils Relative: 5 %
HCT: 32.3 % — ABNORMAL LOW (ref 39.0–52.0)
Hemoglobin: 9.3 g/dL — ABNORMAL LOW (ref 13.0–17.0)
Immature Granulocytes: 0 %
Lymphocytes Relative: 11 %
Lymphs Abs: 0.7 10*3/uL (ref 0.7–4.0)
MCH: 26.1 pg (ref 26.0–34.0)
MCHC: 28.8 g/dL — ABNORMAL LOW (ref 30.0–36.0)
MCV: 90.7 fL (ref 80.0–100.0)
Monocytes Absolute: 0.7 10*3/uL (ref 0.1–1.0)
Monocytes Relative: 10 %
Neutro Abs: 4.6 10*3/uL (ref 1.7–7.7)
Neutrophils Relative %: 74 %
Platelets: 309 10*3/uL (ref 150–400)
RBC: 3.56 MIL/uL — ABNORMAL LOW (ref 4.22–5.81)
RDW: 17 % — ABNORMAL HIGH (ref 11.5–15.5)
WBC: 6.3 10*3/uL (ref 4.0–10.5)
nRBC: 0 % (ref 0.0–0.2)

## 2019-09-08 MED ORDER — ACETAMINOPHEN 500 MG PO TABS: 1000.0000 mg | ORAL_TABLET | Freq: Four times a day (QID) | ORAL | 0 refills | Status: DC | PRN

## 2019-09-08 MED ORDER — METHOCARBAMOL 500 MG PO TABS: 1000.0000 mg | ORAL_TABLET | Freq: Three times a day (TID) | ORAL | 0 refills | Status: DC | PRN

## 2019-09-08 MED ORDER — OXYCODONE HCL 5 MG PO TABS: 5.0000 mg | ORAL_TABLET | Freq: Four times a day (QID) | ORAL | 0 refills | Status: DC | PRN

## 2019-09-08 NOTE — TOC Progression Note (Signed)
Transition of Care [facility]) - Progression Note    Patient Details  Name: [name] [name] MRN: [id number] Date of Birth: Jun 26, 1940  Transition of Care [facility]) CM/SW [place], [place] Phone Number: [phone] 09/08/2019, 12:16 PM  Clinical Narrative:     CSW met with patient via bedside. CSW introduced self and explained role. CSW completed SBIRT with pt. Pt denied alcohol use. Pt did not need counseling or resources.       Expected Discharge Plan and Services           Expected Discharge Date: 09/08/19                                     Social Determinants of Health (SDOH) Interventions    Readmission Risk Interventions No flowsheet data found.  Emeterio Reeve, Latanya Presser, [place] Licensed Holiday representative

## 2019-09-08 NOTE — Discharge Summary (Signed)
Patient ID: [name] [name] [id number] [birth year] 79 y.o.  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 CHF  Discharge Diagnosis Patient Active Problem List   Diagnosis Date Noted  . Multiple fractures of ribs, right side, init for clos fx 09/04/2019  . Chest pain 01/28/2015  . Near syncope 01/28/2015  . Chronic diastolic heart failure ([place]) 01/28/2015  . HCAP (healthcare-associated pneumonia) 10/11/2013  . Acute respiratory failure with hypoxia ([place]) 10/07/2013  . Shock circulatory ([place]) 10/07/2013  . UTI (urinary tract infection) 10/06/2013  . Recurrent UTI 10/06/2013  . Urinary retention 10/06/2013  . Insomnia 09/04/2013  . Bradycardia 08/23/2013  . Acute on chronic diastolic heart failure ([place]) 08/22/2013  . Atrial fibrillation ([place]) 05/21/2013  . Atrial flutter ([place])   . Aortic atherosclerosis ([place]) 05/05/2013  . Aortic dissection ([place]) 05/04/2013  . CAD (coronary artery disease)   . S/P AVR (aortic valve replacement)   . Hyperlipidemia   . HTN (hypertension)     Consultants none  Reason for Admission: This is a 79 yo male with multiple medical problems including a flutter, CAD, CHF, aortic aneurysm s/p extensive stenting, HTN, HLD, on eliquis for stenting.  He is known to Dr. Grandville Silos for a large left inguinal hernia that was scheduled to be repaired next week.  He had been cleared by cardiology.  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  He did hit the right front of his head but didn't lose consciousness.  He fell on his right arm per the wife, but denies any right arm or shoulder pain.  His pain is in his right back.  He admits to pain with breathing.  He denies any other complaints.  Due to pain, he was brought to the ED  where he had a negative head CT and a CT of the chest/abd/pel that revealed posterior right 6,7, and 8 rib fractures.  Due to pain control issues, we have been asked to see him for further evaluation and admission.  Procedures none  Hospital Course:  The patient was admitted for pain control and mobilization.  He worked with therapies who recommended [facility] PT/OT.  He has support at home with his wife and multiple grandchildren.  He ultimately was stable for DC home on HD 4 with good pain control.  He was eating, voiding, and mobilizing with good pain control.  His other medical problems remained stable throughout his admission.  He will follow up with his PCP prn and with Dr. Grandville Silos once his rib fx heal to discuss rescheduling his hernia surgery  Physical Exam: Gen: NAD HEENT; PERRL Neck: no thyromegaly Heart: irregular Lungs: CTAB Abd: soft, NT, ND, +BS Ext: MAE Skin: warm and dry Psych: A&O x3  Allergies as of 09/08/2019      Reactions   Lortab [hydrocodone-acetaminophen] Hives   No trouble breathing   Morphine And Related Hives   Other Other (See Comments)   Staples from surgery caused infection      Medication List    TAKE these medications   acetaminophen 500 MG tablet Commonly known as: TYLENOL Take 2 tablets (1,000 mg total) by mouth every 6 (six) hours as needed.   amiodarone 200 MG tablet Commonly known as: PACERONE Take 1 tablet (200 mg total)  by mouth daily.   apixaban 5 MG Tabs tablet Commonly known as: Eliquis Take 1 tablet (5 mg total) by mouth 2 (two) times daily.   atorvastatin 10 MG tablet Commonly known as: LIPITOR Take 1 tablet by mouth once daily What changed: when to take this   furosemide 40 MG tablet Commonly known as: LASIX Take 1 tablet by mouth twice daily What changed: when to take this   lansoprazole 30 MG capsule Commonly known as: PREVACID Take 30 mg by mouth daily.   loratadine 10 MG tablet Commonly known as: CLARITIN Take 1  tablet (10 mg total) by mouth daily.   methocarbamol 500 MG tablet Commonly known as: ROBAXIN Take 2 tablets (1,000 mg total) by mouth every 8 (eight) hours as needed for muscle spasms.   metoprolol tartrate 25 MG tablet Commonly known as: LOPRESSOR Take 1 tablet (25 mg total) by mouth 2 (two) times daily. What changed:   how much to take  when to take this   multivitamin with minerals Tabs tablet Take 1 tablet by mouth daily.   nitroGLYCERIN 0.4 MG SL tablet Commonly known as: NITROSTAT Place 1 tablet (0.4 mg total) under the tongue every 5 (five) minutes as needed for chest pain (X3 DOSES BEFORE CALLING 911).   oxyCODONE 5 MG immediate release tablet Commonly known as: Oxy IR/ROXICODONE Take 1-2 tablets (5-10 mg total) by mouth every 6 (six) hours as needed for moderate pain.   potassium chloride SA 20 MEQ tablet Commonly known as: KLOR-CON Take 2 tablets by mouth once daily        Follow-up Information    Street, [name], MD Follow up.   Specialty: Family Medicine Why: as needed for your rib fractures and other medical problems Contact information: [address] [place] [address] [phone]        Violeta Gelinas, MD Follow up in 8 week(s).   Specialty: General Surgery Why: Call to reschedule hernia surgery once rib fractures heal Contact information: [address] [place] [place] [address] [phone]           Signed: Barnetta Chapel, [facility] Surgery 09/08/2019, 9:01 AM Please see Amion for pager number during day hours 7:00am-4:30pm, 7-11:30am on Weekends

## 2019-09-08 NOTE — Discharge Instructions (Signed)
Rib Fracture  A rib fracture is a break or crack in one of the bones of the ribs. The ribs are like a cage that goes around your upper chest. A broken or cracked rib is often painful, but most do not cause other problems. Most rib fractures usually heal on their own in 1-3 months. Follow these instructions at home: Managing pain, stiffness, and swelling  If directed, apply ice to the injured area. ? Put ice in a plastic bag. ? Place a towel between your skin and the bag. ? Leave the ice on for 20 minutes, 2-3 times a day.  Take over-the-counter and prescription medicines only as told by your doctor. Activity  Avoid activities that cause pain to the injured area. Protect your injured area.  Slowly increase activity as told by your doctor. General instructions  Do deep breathing as told by your doctor. You may be told to: ? Take deep breaths many times a day. ? Cough many times a day while hugging a pillow. ? Use a device (incentive spirometer) to do deep breathing many times a day.  Drink enough fluid to keep your pee (urine) clear or pale yellow.  Do not wear a rib belt or binder. These do not allow you to breathe deeply.  Keep all follow-up visits as told by your doctor. This is important. Contact a doctor if:  You have a fever. Get help right away if:  You have trouble breathing.  You are short of breath.  You cannot stop coughing.  You cough up thick or bloody spit (sputum).  You feel sick to your stomach (nauseous), throw up (vomit), or have belly (abdominal) pain.  Your pain gets worse and medicine does not help. Summary  A rib fracture is a break or crack in one of the bones of the ribs.  Apply ice to the injured area and take medicines for pain as told by your doctor.  Take deep breaths and cough many times a day. Hug a pillow every time you cough. This information is not intended to replace advice given to you by your health care provider. Make sure you  discuss any questions you have with your health care provider. Document Revised: 05/18/2017 Document Reviewed: 09/05/2016 Elsevier Patient Education  2020 [employer]vier Inc[employer]  

## 2019-09-08 NOTE — Progress Notes (Signed)
Occupational Therapy Treatment Patient Details Name: [name] [name] MRN: [id number] DOB: 1940-09-20 Today's Date: 09/08/2019    History of present illness 79 yo admitted after falling getting into his car with right 6-8 rib fx. PMhx: Aflutter, CAD, CHF, HTN, HLD, inguinal hernia; thoracic aneurysm, descending aortic aneurysm, aortic valve insufficiency   OT comments  Pt continues to progress toward established OT goals. Upon OT arrival, pt sitting in recliner with wife present. Pt required minA for LB dressing. He required minA for sit<>stand due to reports of increased pain in Right ribs and pt reports feeling fatigued. Educated pt and wife on fall prevention strategies, activity progression and energy conservation strategies. Pt continues to demonstrate consistency with using incentive spirometer, able to pull 1,000. Pt will continue to benefit from skilled OT services to maximize safety and independence with ADL/IADL and functional mobility. Will continue to follow acutely and progress as tolerated.    Follow Up Recommendations  Home health OT;Supervision/Assistance - 24 hour    Equipment Recommendations  3 in 1 bedside commode    Recommendations for Other Services      Precautions / Restrictions Precautions Precautions: Fall Restrictions Weight Bearing Restrictions: No       Mobility Bed Mobility Overal bed mobility: Needs Assistance    General bed mobility comments: pt sitting in recliner upon arrival  Transfers Overall transfer level: Needs assistance Equipment used: Rolling walker (2 wheeled) Transfers: Sit to/from Stand Sit to Stand: Min assist         General transfer comment: minA to stand from recliner     Balance Overall balance assessment: Needs assistance Sitting-balance support: Feet supported Sitting balance-Leahy Scale: Good Sitting balance - Comments: able to maintain static sitting with supervision   Standing balance support: No upper extremity  supported Standing balance-Leahy Scale: Fair Standing balance comment: static standing without AD, BUE supported for ambulation                           ADL either performed or assessed with clinical judgement   ADL Overall ADL's : Needs assistance/impaired                     Lower Body Dressing: Moderate assistance;Sit to/from stand Lower Body Dressing Details (indicate cue type and reason): modA to don briefs over feet, pt reports he will plan to have wife assist him with dressing as opposed to using AE Toilet Transfer: Minimal assistance;Stand-pivot Toilet Transfer Details (indicate cue type and reason): minA for support and stabiltiy         Functional mobility during ADLs: Minimal assistance;Rolling walker General ADL Comments: pt with decreased activity tolerance, increased DoE 3/4 following LB dressing;continued to educate pt and wife on fall prevention strategies     Vision       Perception     Praxis      Cognition Arousal/Alertness: Awake/alert Behavior During Therapy: WFL for tasks assessed/performed Overall Cognitive Status: Within Functional Limits for tasks assessed                                 General Comments: pt very motivated to work with therapy;distracted by pain         Exercises Exercises: Other exercises Other Exercises Other Exercises: incentive spirometer x 10 - able to pull 1000 ml Other Exercises: encouraged BUE AROM through full ROM to reduce risk of  shoulder immobiliityl RTC insufficiency at baseline   Shoulder Instructions       General Comments      Pertinent Vitals/ Pain       Pain Assessment: Faces Pain Score: 4  Faces Pain Scale: Hurts even more Pain Location: right chest, ribs  Pain Descriptors / Indicators: Aching;Guarding Pain Intervention(s): Limited activity within patient's tolerance;Monitored during session  Home Living                                           Prior Functioning/Environment              Frequency  Min 3X/week        Progress Toward Goals  OT Goals(current goals can now be found in the care plan section)  Progress towards OT goals: Progressing toward goals  Acute Rehab OT Goals Patient Stated Goal: to be able to go home OT Goal Formulation: With patient Time For Goal Achievement: 09/19/19 Potential to Achieve Goals: Good ADL Goals Pt Will Perform Grooming: with min assist;standing Pt Will Perform Upper Body Bathing: with set-up;with supervision;sitting Pt Will Perform Lower Body Bathing: with min assist;with adaptive equipment;sit to/from stand Pt Will Perform Upper Body Dressing: with set-up;sitting Pt Will Perform Lower Body Dressing: with min assist;with adaptive equipment;sit to/from stand Pt Will Transfer to Toilet: with min guard assist;ambulating;regular height toilet;bedside commode;grab bars Pt Will Perform Toileting - Clothing Manipulation and hygiene: with min guard assist;sit to/from stand  Plan Discharge plan remains appropriate    Co-evaluation                 AM-PAC OT "6 Clicks" Daily Activity     Outcome Measure   Help from another person eating meals?: None Help from another person taking care of personal grooming?: A Little Help from another person toileting, which includes using toliet, bedpan, or urinal?: A Little Help from another person bathing (including washing, rinsing, drying)?: A Lot Help from another person to put on and taking off regular upper body clothing?: A Lot Help from another person to put on and taking off regular lower body clothing?: A Lot 6 Click Score: 16    End of Session Equipment Utilized During Treatment: Rolling walker  OT Visit Diagnosis: Pain;Unsteadiness on feet (R26.81) Pain - Right/Left: Right Pain - part of body: (R ribs)   Activity Tolerance Patient tolerated treatment well   Patient Left in chair;with call bell/phone within reach    Nurse Communication Mobility status        Time: 1017-5102 OT Time Calculation (min): 27 min  Charges: OT General Charges $OT Visit: 1 Visit OT Treatments $Self Care/Home Management : 23-37 mins  Helene Kelp OTR/L Acute Rehabilitation Services Office: [place] 09/08/2019, 1:26 PM

## 2019-09-08 NOTE — TOC Transition Note (Signed)
Transition of Care [facility]) - CM/SW Discharge Note   Patient Details  Name: MANINDER DEBOER MRN: [id number] Date of Birth: 28-Jul-1940  Transition of Care [facility]) CM/SW Contact:  Epifanio Lesches, RN Phone Number: [phone] 09/08/2019, 1:02 PM   Clinical Narrative:    Pt will transition to home today with wife / family. HH  orders noted. NCM spoke with pt and wife @ bedside regarding recommendation from PT/OT for home health services, pt agreeable. Choice list / Medicare HH star rating provided to wife/pt. [facility] selected. Referral made with [facility], orders faxed to [phone]. Acceptance pending... NCM to f/u .   Daughter to provide transportation to home. Lawson Fiscal (Daughter)    [phone]        Final next level of care: Home w Home Health Services Barriers to Discharge: No Barriers Identified   Patient Goals and CMS Choice Patient states their goals for this hospitalization and ongoing recovery are:: to go home and get better   Choice offered to / list presented to : Patient, Spouse  Discharge Placement                       Discharge Plan and Services   Discharge Planning Services: CM Consult Post Acute Care Choice: Home Health          DME Arranged: N/A DME Agency: NA       HH Arranged: PT, OT HH Agency[facility] Date [facility] Agency Contacted: 09/08/19 Time HH Agency Contacted: 1300 Representative spoke with at [facility] Agency: Enrique Sack  Social Determinants of Health (SDOH) Interventions     Readmission Risk Interventions No flowsheet data found.

## 2019-09-08 NOTE — Care Management Important Message (Signed)
Important Message  Patient Details  Name: [name] [name] MRN: [id number] Date of Birth: 1941/04/04   Medicare Important Message Given:        Mardene Sayer 09/08/2019, 10:35 AM

## 2019-09-08 NOTE — Progress Notes (Signed)
Discharge instructions addressed; pt in stable condition; spouse present in room; still waiting for family friend / relative to help them with transport.

## 2019-09-08 NOTE — Progress Notes (Signed)
Physical Therapy Treatment Patient Details Name: [name] [name] MRN: [id number] DOB: [birth year] Today's Date: 09/08/2019    History of Present Illness 79 yo admitted after falling getting into his car with right 6-8 rib fx. PMhx: Aflutter, CAD, CHF, HTN, HLD, inguinal hernia; thoracic aneurysm, descending aortic aneurysm, aortic valve insufficiency    PT Comments    Pt in chair with wife present throughout session. Pt with significantly improved mobility from eval with improved pain control and increased understanding of sequence of movement. Pt continues to require assist for bed mobility but has option of sleeping in recliner at home. Pt able to walk and perform transfer sufficient for home and wife agreeable. Pt achieving 1000cc on IS and educated for continued use.     Follow Up Recommendations  Home health PT;Supervision/Assistance - 24 hour     Equipment Recommendations  None recommended by PT    Recommendations for Other Services       Precautions / Restrictions Precautions Precautions: Fall    Mobility  Bed Mobility Overal bed mobility: Needs Assistance Bed Mobility: Rolling;Sidelying to Sit;Sit to Sidelying Rolling: Min guard Sidelying to sit: Min guard   Sit to supine: Mod assist   General bed mobility comments: pt with assist to elevate legs onto surface with cues for rolling and tactile cues to roll back to side and elevate trunk to sitting  Transfers Overall transfer level: Modified independent               General transfer comment: pt able to stand from bed and recliner  Ambulation/Gait Ambulation/Gait assistance: Min guard Gait Distance (Feet): 160 Feet Assistive device: Rolling walker (2 wheeled) Gait Pattern/deviations: Step-through pattern;Decreased stride length;Trunk flexed   Gait velocity interpretation: <1.8 ft/sec, indicate of risk for recurrent falls General Gait Details: slow steady pace with cues for posture and breathing  sequence. Pt self regulating activity   Stairs             Wheelchair Mobility    Modified Rankin (Stroke Patients Only)       Balance Overall balance assessment: Needs assistance   Sitting balance-Leahy Scale: Good       Standing balance-Leahy Scale: Fair Standing balance comment: static standing without AD, BUE supported for ambulation                            Cognition Arousal/Alertness: Awake/alert Behavior During Therapy: WFL for tasks assessed/performed Overall Cognitive Status: Within Functional Limits for tasks assessed                                        Exercises      General Comments        Pertinent Vitals/Pain Pain Score: 4  Pain Location: right chest, ribs  Pain Descriptors / Indicators: Aching;Guarding Pain Intervention(s): Limited activity within patient's tolerance;Monitored during session;Premedicated before session;Repositioned    Home Living                      Prior Function            PT Goals (current goals can now be found in the care plan section) Progress towards PT goals: Progressing toward goals    Frequency    Min 3X/week      PT Plan Current plan remains appropriate;Frequency needs to be updated  Co-evaluation              AM-PAC PT "6 Clicks" Mobility   Outcome Measure  Help needed turning from your back to your side while in a flat bed without using bedrails?: A Little Help needed moving from lying on your back to sitting on the side of a flat bed without using bedrails?: A Little Help needed moving to and from a bed to a chair (including a wheelchair)?: A Little Help needed standing up from a chair using your arms (e.g., wheelchair or bedside chair)?: None Help needed to walk in hospital room?: A Little Help needed climbing 3-5 steps with a railing? : A Little 6 Click Score: 19    End of Session   Activity Tolerance: Patient tolerated treatment  well Patient left: in chair;with call bell/phone within reach;with chair alarm set;with family/visitor present Nurse Communication: Mobility status;Precautions PT Visit Diagnosis: Other abnormalities of gait and mobility (R26.89);Difficulty in walking, not elsewhere classified (R26.2)     Time: 1032-1050 PT Time Calculation (min) (ACUTE ONLY): 18 min  Charges:  $Gait Training: 8-22 mins                     Bayard Males, PT Acute Rehabilitation Services Pager: [phone] Office: [phone]    Sandy Salaam Cresencia Asmus 09/08/2019, 12:59 PM

## 2019-09-09 ENCOUNTER — Other Ambulatory Visit (HOSPITAL_COMMUNITY): Payer: Medicare Other

## 2019-09-12 ENCOUNTER — Ambulatory Visit: Admit: 2019-09-12 | Payer: Medicare Other | Admitting: General Surgery

## 2019-09-12 SURGERY — REPAIR, HERNIA, INGUINAL, ADULT
Anesthesia: General | Laterality: Left

## 2019-09-16 DIAGNOSIS — I712 Thoracic aortic aneurysm, without rupture: Secondary | ICD-10-CM | POA: Diagnosis not present

## 2019-09-16 DIAGNOSIS — E785 Hyperlipidemia, unspecified: Secondary | ICD-10-CM | POA: Diagnosis not present

## 2019-09-16 DIAGNOSIS — I480 Paroxysmal atrial fibrillation: Secondary | ICD-10-CM | POA: Diagnosis not present

## 2019-09-16 DIAGNOSIS — Z9181 History of falling: Secondary | ICD-10-CM | POA: Diagnosis not present

## 2019-09-16 DIAGNOSIS — K4031 Unilateral inguinal hernia, with obstruction, without gangrene, recurrent: Secondary | ICD-10-CM | POA: Diagnosis not present

## 2019-09-16 DIAGNOSIS — Z79891 Long term (current) use of opiate analgesic: Secondary | ICD-10-CM | POA: Diagnosis not present

## 2019-09-16 DIAGNOSIS — I2510 Atherosclerotic heart disease of native coronary artery without angina pectoris: Secondary | ICD-10-CM | POA: Diagnosis not present

## 2019-09-16 DIAGNOSIS — I4892 Unspecified atrial flutter: Secondary | ICD-10-CM | POA: Diagnosis not present

## 2019-09-16 DIAGNOSIS — R2689 Other abnormalities of gait and mobility: Secondary | ICD-10-CM | POA: Diagnosis not present

## 2019-09-16 DIAGNOSIS — Z952 Presence of prosthetic heart valve: Secondary | ICD-10-CM | POA: Diagnosis not present

## 2019-09-16 DIAGNOSIS — S2241XD Multiple fractures of ribs, right side, subsequent encounter for fracture with routine healing: Secondary | ICD-10-CM | POA: Diagnosis not present

## 2019-09-16 DIAGNOSIS — I110 Hypertensive heart disease with heart failure: Secondary | ICD-10-CM | POA: Diagnosis not present

## 2019-09-16 DIAGNOSIS — Z7902 Long term (current) use of antithrombotics/antiplatelets: Secondary | ICD-10-CM | POA: Diagnosis not present

## 2019-09-16 DIAGNOSIS — Z79899 Other long term (current) drug therapy: Secondary | ICD-10-CM | POA: Diagnosis not present

## 2019-09-16 DIAGNOSIS — W19XXXD Unspecified fall, subsequent encounter: Secondary | ICD-10-CM | POA: Diagnosis not present

## 2019-09-16 DIAGNOSIS — R2681 Unsteadiness on feet: Secondary | ICD-10-CM | POA: Diagnosis not present

## 2019-09-18 DIAGNOSIS — R0600 Dyspnea, unspecified: Secondary | ICD-10-CM

## 2019-09-18 HISTORY — DX: Dyspnea, unspecified: R06.00

## 2019-09-22 DIAGNOSIS — S2241XD Multiple fractures of ribs, right side, subsequent encounter for fracture with routine healing: Secondary | ICD-10-CM | POA: Diagnosis not present

## 2019-09-23 DIAGNOSIS — I480 Paroxysmal atrial fibrillation: Secondary | ICD-10-CM | POA: Diagnosis not present

## 2019-09-23 DIAGNOSIS — S2241XD Multiple fractures of ribs, right side, subsequent encounter for fracture with routine healing: Secondary | ICD-10-CM | POA: Diagnosis not present

## 2019-09-23 DIAGNOSIS — I2510 Atherosclerotic heart disease of native coronary artery without angina pectoris: Secondary | ICD-10-CM | POA: Diagnosis not present

## 2019-09-23 DIAGNOSIS — I712 Thoracic aortic aneurysm, without rupture: Secondary | ICD-10-CM | POA: Diagnosis not present

## 2019-09-23 DIAGNOSIS — I110 Hypertensive heart disease with heart failure: Secondary | ICD-10-CM | POA: Diagnosis not present

## 2019-09-23 DIAGNOSIS — I4892 Unspecified atrial flutter: Secondary | ICD-10-CM | POA: Diagnosis not present

## 2019-09-25 DIAGNOSIS — H43813 Vitreous degeneration, bilateral: Secondary | ICD-10-CM | POA: Diagnosis not present

## 2019-09-25 DIAGNOSIS — H3582 Retinal ischemia: Secondary | ICD-10-CM | POA: Diagnosis not present

## 2019-09-25 DIAGNOSIS — H35033 Hypertensive retinopathy, bilateral: Secondary | ICD-10-CM | POA: Diagnosis not present

## 2019-09-25 DIAGNOSIS — H34811 Central retinal vein occlusion, right eye, with macular edema: Secondary | ICD-10-CM | POA: Diagnosis not present

## 2019-09-26 ENCOUNTER — Ambulatory Visit: Payer: Self-pay | Admitting: General Surgery

## 2019-09-26 NOTE — Telephone Encounter (Signed)
[name] [name] is calling stating [name] [name] surgery has been postponed to 10/03/19 due to him breaking some ribs. She states she would like a nurse to call and walk her through medication management before his procedure. Please advise.

## 2019-09-26 NOTE — Telephone Encounter (Signed)
Called [name] [name] and answered all questions.

## 2019-09-29 ENCOUNTER — Other Ambulatory Visit: Payer: Self-pay | Admitting: Cardiothoracic Surgery

## 2019-09-29 DIAGNOSIS — I71019 Dissection of thoracic aorta, unspecified: Secondary | ICD-10-CM

## 2019-09-29 DIAGNOSIS — I7101 Dissection of thoracic aorta: Secondary | ICD-10-CM

## 2019-09-30 ENCOUNTER — Encounter (HOSPITAL_COMMUNITY)
Admission: RE | Admit: 2019-09-30 | Discharge: 2019-09-30 | Disposition: A | Discharge status: Home / Self Care | Payer: Medicare Other | Source: Ambulatory Visit | Attending: General Surgery | Admitting: General Surgery

## 2019-09-30 ENCOUNTER — Other Ambulatory Visit: Payer: Self-pay

## 2019-09-30 ENCOUNTER — Other Ambulatory Visit (HOSPITAL_COMMUNITY)
Admission: RE | Admit: 2019-09-30 | Discharge: 2019-09-30 | Disposition: A | Discharge status: Home / Self Care | Payer: Medicare Other | Source: Ambulatory Visit | Attending: General Surgery | Admitting: General Surgery

## 2019-09-30 ENCOUNTER — Encounter (HOSPITAL_COMMUNITY): Payer: Self-pay

## 2019-09-30 DIAGNOSIS — Z79899 Other long term (current) drug therapy: Secondary | ICD-10-CM | POA: Insufficient documentation

## 2019-09-30 DIAGNOSIS — Z7901 Long term (current) use of anticoagulants: Secondary | ICD-10-CM | POA: Insufficient documentation

## 2019-09-30 DIAGNOSIS — I4892 Unspecified atrial flutter: Secondary | ICD-10-CM | POA: Insufficient documentation

## 2019-09-30 DIAGNOSIS — Z01812 Encounter for preprocedural laboratory examination: Secondary | ICD-10-CM | POA: Insufficient documentation

## 2019-09-30 DIAGNOSIS — Z20822 Contact with and (suspected) exposure to covid-19: Secondary | ICD-10-CM | POA: Insufficient documentation

## 2019-09-30 DIAGNOSIS — I4891 Unspecified atrial fibrillation: Secondary | ICD-10-CM | POA: Insufficient documentation

## 2019-09-30 DIAGNOSIS — I2510 Atherosclerotic heart disease of native coronary artery without angina pectoris: Secondary | ICD-10-CM | POA: Insufficient documentation

## 2019-09-30 LAB — BASIC METABOLIC PANEL
Anion gap: 12 (ref 5–15)
BUN: 19 mg/dL (ref 8–23)
CO2: 26 mmol/L (ref 22–32)
Calcium: 9.2 mg/dL (ref 8.9–10.3)
Chloride: 105 mmol/L (ref 98–111)
Creatinine, Ser: 1.02 mg/dL (ref 0.61–1.24)
GFR calc Af Amer: >60 mL/min (ref >60)
GFR calc non Af Amer: >60 mL/min (ref >60)
Glucose, Bld: 105 mg/dL — ABNORMAL HIGH (ref 70–99)
Potassium: 4.6 mmol/L (ref 3.5–5.1)
Sodium: 143 mmol/L (ref 135–145)

## 2019-09-30 LAB — CBC
HCT: 34.6 % — ABNORMAL LOW (ref 39.0–52.0)
Hemoglobin: 9.7 g/dL — ABNORMAL LOW (ref 13.0–17.0)
MCH: 26.2 pg (ref 26.0–34.0)
MCHC: 28 g/dL — ABNORMAL LOW (ref 30.0–36.0)
MCV: 93.5 fL (ref 80.0–100.0)
Platelets: 239 10*3/uL (ref 150–400)
RBC: 3.7 MIL/uL — ABNORMAL LOW (ref 4.22–5.81)
RDW: 16.8 % — ABNORMAL HIGH (ref 11.5–15.5)
WBC: 6.5 10*3/uL (ref 4.0–10.5)
nRBC: 0 % (ref 0.0–0.2)

## 2019-09-30 LAB — SARS CORONAVIRUS 2 (TAT 6-24 HRS): SARS Coronavirus 2: NEGATIVE

## 2019-09-30 NOTE — Progress Notes (Addendum)
[facility], [place] - [address] [facility] [place] [address] Phone: [phone] Fax: [phone]  [id number] - [place], [place] - [address] [place] [address] Phone: [phone] Fax: [phone]    Your procedure is scheduled on Friday, April 16th.  Report to [facility] Main Entrance "A" at 7:00 A.M., and check in at the Admitting office.  Call this number if you have problems the morning of surgery:  [phone]  Call [phone] if you have any questions prior to your surgery date Monday-Friday 8am-4pm   Remember:  Do not eat after midnight the night before your surgery  You may drink clear liquids until 6:00 AM the morning of your surgery.   Clear liquids allowed are: Water, Non-Citrus Juices (without pulp), Carbonated Beverages, Clear Tea, Black Coffee Only, and Gatorade    Take these medicines the morning of surgery with A SIP OF WATER   Tylenol - if needed  Amiodarone (Pacerone)  Atorvastatin (Lipitor)  Lansoprazole (Prevacid)  Claritin - if needed         metoprolol tartrate (LOPRESSOR)  Methocarbamol (Robaxin) - if needed  Nitroglycerin tablet - if needed   Oxycodone - if needed  Follow your surgeon's instructions on when to stop Eliquis.  If no instructions were given by your surgeon then you will need to call the office to get those instructions.    As of today, STOP taking any Aspirin (unless otherwise instructed by your surgeon) and Aspirin containing products, Aleve, Naproxen, Ibuprofen, Motrin, Advil, Goody's, BC's, all herbal medications, fish oil, and all vitamins.                     Do not wear jewelry.            Do not wear lotions, powders, colognes, or deodorant.            Men may shave face and neck.            Do not bring valuables to the hospital.            [facility] is not responsible for any belongings or valuables.  Do NOT Smoke (Tobacco/Vapping) or drink  Alcohol 24 hours prior to your procedure If you use a CPAP at night, you may bring all equipment for your overnight stay.   Contacts, glasses, dentures or bridgework may not be worn into surgery.      For patients admitted to the hospital, discharge time will be determined by your treatment team.   Patients discharged the day of surgery will not be allowed to drive home, and someone needs to stay with them for 24 hours.  Special instructions:   [place]- Preparing For Surgery  Before surgery, you can play an important role. Because skin is not sterile, your skin needs to be as free of germs as possible. You can reduce the number of germs on your skin by washing with CHG (chlorahexidine gluconate) Soap before surgery.  CHG is an antiseptic cleaner which kills germs and bonds with the skin to continue killing germs even after washing.    Oral Hygiene is also important to reduce your risk of infection.  Remember - BRUSH YOUR TEETH THE MORNING OF SURGERY WITH YOUR REGULAR TOOTHPASTE  Please do not use if you have an allergy to CHG or antibacterial soaps. If your skin becomes reddened/irritated stop using the CHG.  Do not shave (including  legs and underarms) for at least 48 hours prior to first CHG shower. It is OK to shave your face.  Please follow these instructions carefully.   1. Shower the NIGHT BEFORE SURGERY and the MORNING OF SURGERY with CHG Soap.   2. If you chose to wash your hair, wash your hair first as usual with your normal shampoo.  3. After you shampoo, rinse your hair and body thoroughly to remove the shampoo.  4. Use CHG as you would any other liquid soap. You can apply CHG directly to the skin and wash gently with a scrungie or a clean washcloth.   5. Apply the CHG Soap to your body ONLY FROM THE NECK DOWN.  Do not use on open wounds or open sores. Avoid contact with your eyes, ears, mouth and genitals (private parts). Wash Face and genitals (private parts)  with your  normal soap.   6. Wash thoroughly, paying special attention to the area where your surgery will be performed.  7. Thoroughly rinse your body with warm water from the neck down.  8. DO NOT shower/wash with your normal soap after using and rinsing off the CHG Soap.  9. Pat yourself dry with a CLEAN TOWEL.  10. Wear CLEAN PAJAMAS to bed the night before surgery, wear comfortable clothes the morning of surgery  11. Place CLEAN SHEETS on your bed the night of your first shower and DO NOT SLEEP WITH PETS.  Day of Surgery: Do not apply any deodorants/lotions.  Please wear clean clothes to the hospital/surgery center.   Remember to brush your teeth WITH YOUR REGULAR TOOTHPASTE.   Please read over the following fact sheets that you were given.

## 2019-09-30 NOTE — Progress Notes (Signed)
PCP - Dr. Maryjean Ka Cardiologist - Dr. Lance Muss  PPM/ICD - denies  Chest x-ray - N/A EKG - 09/08/2019 Stress Test - denies ECHO - 08/23/13 Cardiac Cath - denies   Sleep Study - denies CPAP - N/A  Blood Thinner Instructions: ELIQUIS: to be held 24 hours prior surgery per patient Aspirin Instructions: N/A  ERAS Protcol - No  COVID TEST- 09/30/2019, test results pending  Anesthesia review: YES, cardiac hx, cardiac clearance, EKG  Patient denies shortness of breath, fever, cough and chest pain at PAT appointment  All instructions explained to the patient, with a verbal understanding of the material. Patient agrees to go over the instructions while at home for a better understanding. Patient also instructed to self quarantine after being tested for COVID-19. The opportunity to ask questions was provided.

## 2019-09-30 NOTE — Progress Notes (Signed)
[facility], [place] - [address] [facility] [place] [address] Phone: [phone] Fax: [phone]  [id number] - [place], [place] - [address] [place] [address] Phone: [phone] Fax: [phone]      Your procedure is scheduled on Friday, April 16th.  Report to [facility] Main Entrance "A" at 7:00 A.M., and check in at the Admitting office.  Call this number if you have problems the morning of surgery:  [phone]  Call [phone] if you have any questions prior to your surgery date Monday-Friday 8am-4pm    Remember:  Do not eat after midnight the night before your surgery  You may drink clear liquids until 6:00 AM the morning of your surgery.   Clear liquids allowed are: Water, Non-Citrus Juices (without pulp), Carbonated Beverages, Clear Tea, Black Coffee Only, and Gatorade    Take these medicines the morning of surgery with A SIP OF WATER   Tylenol - if needed  Amiodarone (Pacerone)  Atorvastatin (Lipitor)  Lansoprazole (Prevacid)  Claritin - if needed  Methocarbamol (Robaxin)  Metoprolol (Lopressor)  Nitroglycerin tablet - if needed   Oxycodone - if needed  Follow your surgeon's instructions on when to stop Eliquis.  If no instructions were given by your surgeon then you will need to call the office to get those instructions.    As of today, STOP taking any Aspirin (unless otherwise instructed by your surgeon) and Aspirin containing products, Aleve, Naproxen, Ibuprofen, Motrin, Advil, Goody's, BC's, all herbal medications, fish oil, and all vitamins.                      Do not wear jewelry.            Do not wear lotions, powders, colognes, or deodorant.            Men may shave face and neck.            Do not bring valuables to the hospital.            [facility] is not responsible for any belongings or valuables.  Do NOT Smoke (Tobacco/Vapping) or drink Alcohol 24 hours prior to  your procedure If you use a CPAP at night, you may bring all equipment for your overnight stay.   Contacts, glasses, dentures or bridgework may not be worn into surgery.      For patients admitted to the hospital, discharge time will be determined by your treatment team.   Patients discharged the day of surgery will not be allowed to drive home, and someone needs to stay with them for 24 hours.    Special instructions:   [facility]- Preparing For Surgery  Before surgery, you can play an important role. Because skin is not sterile, your skin needs to be as free of germs as possible. You can reduce the number of germs on your skin by washing with CHG (chlorahexidine gluconate) Soap before surgery.  CHG is an antiseptic cleaner which kills germs and bonds with the skin to continue killing germs even after washing.    Oral Hygiene is also important to reduce your risk of infection.  Remember - BRUSH YOUR TEETH THE MORNING OF SURGERY WITH YOUR REGULAR TOOTHPASTE  Please do not use if you have an allergy to CHG or antibacterial soaps. If your skin becomes reddened/irritated stop using the CHG.  Do not shave (including legs and underarms) for at  least 48 hours prior to first CHG shower. It is OK to shave your face.  Please follow these instructions carefully.   1. Shower the NIGHT BEFORE SURGERY and the MORNING OF SURGERY with CHG Soap.   2. If you chose to wash your hair, wash your hair first as usual with your normal shampoo.  3. After you shampoo, rinse your hair and body thoroughly to remove the shampoo.  4. Use CHG as you would any other liquid soap. You can apply CHG directly to the skin and wash gently with a scrungie or a clean washcloth.   5. Apply the CHG Soap to your body ONLY FROM THE NECK DOWN.  Do not use on open wounds or open sores. Avoid contact with your eyes, ears, mouth and genitals (private parts). Wash Face and genitals (private parts)  with your normal soap.    6. Wash thoroughly, paying special attention to the area where your surgery will be performed.  7. Thoroughly rinse your body with warm water from the neck down.  8. DO NOT shower/wash with your normal soap after using and rinsing off the CHG Soap.  9. Pat yourself dry with a CLEAN TOWEL.  10. Wear CLEAN PAJAMAS to bed the night before surgery, wear comfortable clothes the morning of surgery  11. Place CLEAN SHEETS on your bed the night of your first shower and DO NOT SLEEP WITH PETS.   Day of Surgery:   Do not apply any deodorants/lotions.  Please wear clean clothes to the hospital/surgery center.   Remember to brush your teeth WITH YOUR REGULAR TOOTHPASTE.   Please read over the following fact sheets that you were given.

## 2019-10-01 ENCOUNTER — Ambulatory Visit (HOSPITAL_COMMUNITY): Payer: Medicare Other | Admitting: Anesthesiology

## 2019-10-01 ENCOUNTER — Ambulatory Visit (HOSPITAL_COMMUNITY): Payer: Medicare Other | Admitting: Physician Assistant

## 2019-10-01 NOTE — Anesthesia Preprocedure Evaluation (Signed)
Anesthesia Evaluation    Airway        Dental   Pulmonary former smoker,           Cardiovascular hypertension,      Neuro/Psych    GI/Hepatic   Endo/Other    Renal/GU      Musculoskeletal   Abdominal   Peds  Hematology   Anesthesia Other Findings   Reproductive/Obstetrics                             Anesthesia Physical Anesthesia Plan  ASA:   Anesthesia Plan:    Post-op Pain Management:    Induction:   PONV Risk Score and Plan:   Airway Management Planned:   Additional Equipment:   Intra-op Plan:   Post-operative Plan:   Informed Consent:   Plan Discussed with:   Anesthesia Plan Comments: (Follows with cardiology for 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 Cardiac clearance per telephone encounter 07/25/19, "Patient was contacted 07/25/2019 in reference to pre-operative risk assessment for pending surgery as outlined below.  CELESTE CANDELAS was last seen on 05/28/2019 by Dr. Irish Lack.  Since that day, GABE GLACE has done well. Therefore, based on ACC/AHA guidelines, the patient would be at acceptable risk for the planned procedure without further cardiovascular testing. He is requested to take the amiodarone and metoprolol the morning of the surgery.  He should not miss any doses of those medications.The pharmacist has cleared him to hold the Eliquis for 24 hours, any longer than that, and he will need a Lovenox bridge." Dr. Irish Lack commented that he would "be willing to have him take last dose 36 hours before surgery.  I believe that is the same as holding 24 hours prior."  Recent admission 3/18-3/22/21 for  fall that resulted in posterior right 6,7, and 8 rib fractures. He was admitted for pain control and mobilization.   Preop labs reviewed show chronic anemia, Hgb 9.7 which appears to be near his baseline. Remainder of labs unremarkable.   EKG 09/04/19: Afib with RVR, rate 108. Does not appear significantly change from previous.   Nuclear stress 01/29/15: Defect 1: There is a medium defect of moderate severity present in the basal inferior, basal inferolateral, mid inferior, mid inferolateral and apical lateral location. This is a fixed defect. No ischemia noted. This is an intermediate risk study. The left ventricular ejection fraction is mildly decreased (45-54%). Nuclear stress EF: 46%. Baseline ST/T wave abnormality in the inferolateral leads with no change from baseline during Lexiscan infusion.  TEE 08/26/13: - Left ventricle: The cavity size was normal. Wall thickness  was increased in a pattern of mild LVH. Systolic function  was normal. The estimated ejection fraction was in the  range of 50% to 55%. Doppler parameters are consistent  with a reversible restrictive pattern, indicative of  decreased left ventricular diastolic compliance and/or  increased left atrial pressure (grade 3 diastolic  dysfunction).  - Aortic valve: Normal appearing tissue bioprosthetic valve  with normal systolic gradient  and no AR  - Mitral valve: Calcified annulus. Mild regurgitation.  - Left atrium: The atrium was moderately dilated.  - Right ventricle: The cavity size was moderately dilated.  -  Right atrium: The atrium was severely dilated.  - Atrial septum: No defect or patent foramen ovale was  identified.  - Tricuspid valve: Moderate regurgitation.  - Pulmonary arteries: PA peak pressure: 46mm Hg (S).  )        Anesthesia Quick Evaluation

## 2019-10-01 NOTE — Progress Notes (Signed)
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 Cardiac clearance per telephone encounter 07/25/19, "Patient was contacted 07/25/2019 in reference to pre-operative risk assessment for pending surgery as outlined below.  [name] [name] was last seen on 05/28/2019 by [name].  Since that day, [name] [name] has done well. Therefore, based on ACC/AHA guidelines, the patient would be at acceptable risk for the planned procedure without further cardiovascular testing. He is requested to take the amiodarone and metoprolol the morning of the surgery.  He should not miss any doses of those medications.The pharmacist has cleared him to hold the Eliquis for 24 hours, any longer than that, and he will need a Lovenox bridge." [name] commented that he would "be willing to have him take last dose 36 hours before surgery.  I believe that is the same as holding 24 hours prior."  Recent admission 3/18-3/22/21 for fall that resulted in posterior right 6,7, and 8 rib fractures. He was admitted for pain control and mobilization.   Preop labs reviewed show chronic anemia, Hgb 9.7 which appears to be near his baseline. Remainder of labs unremarkable.   EKG 09/04/19: Afib with RVR, rate 108. Does not appear significantly change from previous.   Nuclear stress 01/29/15:  Defect 1: There is a medium defect of moderate severity present in the basal inferior, basal inferolateral, mid inferior, mid inferolateral and apical lateral location. This is a fixed defect. No ischemia noted.  This is an intermediate risk study.  The left ventricular ejection fraction is mildly decreased (45-54%).  Nuclear  stress EF: 46%.  Baseline ST/T wave abnormality in the inferolateral leads with no change from baseline during Lexiscan infusion.  TEE 08/26/13: - Left ventricle: The cavity size was normal. Wall thickness  was increased in a pattern of mild LVH. Systolic function  was normal. The estimated ejection fraction was in the  range of 50% to 55%. Doppler parameters are consistent  with a reversible restrictive pattern, indicative of  decreased left ventricular diastolic compliance and/or  increased left atrial pressure (grade 3 diastolic  dysfunction).  - Aortic valve: Normal appearing tissue bioprosthetic valve  with normal systolic gradient  and no AR  - Mitral valve: Calcified annulus. Mild regurgitation.  - Left atrium: The atrium was moderately dilated.  - Right ventricle: The cavity size was moderately dilated.  - Right atrium: The atrium was severely dilated.  - Atrial septum: No defect or patent foramen ovale was  identified.  - Tricuspid valve: Moderate regurgitation.  - Pulmonary arteries: PA peak pressure: 4mm Hg (S).   [facility] Short Stay Center/Anesthesiology Phone [phone] 10/01/2019 11:27 AM

## 2019-10-03 ENCOUNTER — Encounter (HOSPITAL_COMMUNITY): Payer: Self-pay | Admitting: General Surgery

## 2019-10-03 ENCOUNTER — Encounter (HOSPITAL_COMMUNITY)
Admission: AD | Disposition: A | Discharge status: Home Health | Payer: Self-pay | Source: Home / Self Care | Attending: Internal Medicine

## 2019-10-03 ENCOUNTER — Inpatient Hospital Stay (HOSPITAL_COMMUNITY): Payer: Medicare Other

## 2019-10-03 ENCOUNTER — Other Ambulatory Visit: Payer: Self-pay

## 2019-10-03 ENCOUNTER — Inpatient Hospital Stay (HOSPITAL_COMMUNITY)
Admission: AD | Admit: 2019-10-03 | Discharge: 2019-10-07 | DRG: 393 | Disposition: A | Discharge status: Home Health | Payer: Medicare Other | Attending: Internal Medicine | Admitting: Internal Medicine

## 2019-10-03 ENCOUNTER — Other Ambulatory Visit (HOSPITAL_COMMUNITY): Payer: Medicare Other

## 2019-10-03 ENCOUNTER — Ambulatory Visit (HOSPITAL_COMMUNITY): Payer: Medicare Other

## 2019-10-03 DIAGNOSIS — J9621 Acute and chronic respiratory failure with hypoxia: Secondary | ICD-10-CM | POA: Diagnosis present

## 2019-10-03 DIAGNOSIS — I110 Hypertensive heart disease with heart failure: Secondary | ICD-10-CM | POA: Diagnosis present

## 2019-10-03 DIAGNOSIS — Z79899 Other long term (current) drug therapy: Secondary | ICD-10-CM

## 2019-10-03 DIAGNOSIS — I484 Atypical atrial flutter: Secondary | ICD-10-CM | POA: Diagnosis not present

## 2019-10-03 DIAGNOSIS — I5043 Acute on chronic combined systolic (congestive) and diastolic (congestive) heart failure: Secondary | ICD-10-CM | POA: Diagnosis present

## 2019-10-03 DIAGNOSIS — D509 Iron deficiency anemia, unspecified: Secondary | ICD-10-CM | POA: Diagnosis not present

## 2019-10-03 DIAGNOSIS — I5022 Chronic systolic (congestive) heart failure: Secondary | ICD-10-CM

## 2019-10-03 DIAGNOSIS — Z01818 Encounter for other preprocedural examination: Secondary | ICD-10-CM

## 2019-10-03 DIAGNOSIS — I340 Nonrheumatic mitral (valve) insufficiency: Secondary | ICD-10-CM | POA: Diagnosis present

## 2019-10-03 DIAGNOSIS — I272 Pulmonary hypertension, unspecified: Secondary | ICD-10-CM | POA: Diagnosis present

## 2019-10-03 DIAGNOSIS — I5021 Acute systolic (congestive) heart failure: Secondary | ICD-10-CM | POA: Diagnosis not present

## 2019-10-03 DIAGNOSIS — Z8249 Family history of ischemic heart disease and other diseases of the circulatory system: Secondary | ICD-10-CM | POA: Diagnosis not present

## 2019-10-03 DIAGNOSIS — R0602 Shortness of breath: Secondary | ICD-10-CM | POA: Diagnosis not present

## 2019-10-03 DIAGNOSIS — I5032 Chronic diastolic (congestive) heart failure: Principal | ICD-10-CM | POA: Diagnosis present

## 2019-10-03 DIAGNOSIS — J811 Chronic pulmonary edema: Secondary | ICD-10-CM | POA: Diagnosis not present

## 2019-10-03 DIAGNOSIS — Z87891 Personal history of nicotine dependence: Secondary | ICD-10-CM

## 2019-10-03 DIAGNOSIS — Z5309 Procedure and treatment not carried out because of other contraindication: Secondary | ICD-10-CM | POA: Diagnosis present

## 2019-10-03 DIAGNOSIS — D649 Anemia, unspecified: Secondary | ICD-10-CM | POA: Diagnosis present

## 2019-10-03 DIAGNOSIS — Z955 Presence of coronary angioplasty implant and graft: Secondary | ICD-10-CM

## 2019-10-03 DIAGNOSIS — I2582 Chronic total occlusion of coronary artery: Secondary | ICD-10-CM | POA: Diagnosis present

## 2019-10-03 DIAGNOSIS — Z885 Allergy status to narcotic agent status: Secondary | ICD-10-CM

## 2019-10-03 DIAGNOSIS — E785 Hyperlipidemia, unspecified: Secondary | ICD-10-CM | POA: Diagnosis present

## 2019-10-03 DIAGNOSIS — R079 Chest pain, unspecified: Secondary | ICD-10-CM | POA: Diagnosis not present

## 2019-10-03 DIAGNOSIS — W19XXXD Unspecified fall, subsequent encounter: Secondary | ICD-10-CM | POA: Diagnosis present

## 2019-10-03 DIAGNOSIS — Z96653 Presence of artificial knee joint, bilateral: Secondary | ICD-10-CM | POA: Diagnosis present

## 2019-10-03 DIAGNOSIS — I4892 Unspecified atrial flutter: Secondary | ICD-10-CM | POA: Diagnosis present

## 2019-10-03 DIAGNOSIS — Z86718 Personal history of other venous thrombosis and embolism: Secondary | ICD-10-CM | POA: Diagnosis not present

## 2019-10-03 DIAGNOSIS — I480 Paroxysmal atrial fibrillation: Secondary | ICD-10-CM | POA: Diagnosis present

## 2019-10-03 DIAGNOSIS — I4891 Unspecified atrial fibrillation: Secondary | ICD-10-CM | POA: Diagnosis present

## 2019-10-03 DIAGNOSIS — Z20822 Contact with and (suspected) exposure to covid-19: Secondary | ICD-10-CM | POA: Diagnosis present

## 2019-10-03 DIAGNOSIS — I2510 Atherosclerotic heart disease of native coronary artery without angina pectoris: Secondary | ICD-10-CM | POA: Diagnosis present

## 2019-10-03 DIAGNOSIS — J9601 Acute respiratory failure with hypoxia: Secondary | ICD-10-CM | POA: Diagnosis present

## 2019-10-03 DIAGNOSIS — I10 Essential (primary) hypertension: Secondary | ICD-10-CM | POA: Diagnosis not present

## 2019-10-03 DIAGNOSIS — I5023 Acute on chronic systolic (congestive) heart failure: Secondary | ICD-10-CM | POA: Diagnosis not present

## 2019-10-03 DIAGNOSIS — I429 Cardiomyopathy, unspecified: Secondary | ICD-10-CM | POA: Diagnosis present

## 2019-10-03 DIAGNOSIS — Z7901 Long term (current) use of anticoagulants: Secondary | ICD-10-CM

## 2019-10-03 DIAGNOSIS — S2241XD Multiple fractures of ribs, right side, subsequent encounter for fracture with routine healing: Secondary | ICD-10-CM

## 2019-10-03 DIAGNOSIS — Z953 Presence of xenogenic heart valve: Secondary | ICD-10-CM | POA: Diagnosis not present

## 2019-10-03 DIAGNOSIS — Z8379 Family history of other diseases of the digestive system: Secondary | ICD-10-CM | POA: Diagnosis not present

## 2019-10-03 DIAGNOSIS — H5461 Unqualified visual loss, right eye, normal vision left eye: Secondary | ICD-10-CM | POA: Diagnosis present

## 2019-10-03 DIAGNOSIS — Z952 Presence of prosthetic heart valve: Secondary | ICD-10-CM | POA: Diagnosis not present

## 2019-10-03 DIAGNOSIS — L8990 Pressure ulcer of unspecified site, unspecified stage: Secondary | ICD-10-CM | POA: Insufficient documentation

## 2019-10-03 DIAGNOSIS — I5042 Chronic combined systolic (congestive) and diastolic (congestive) heart failure: Secondary | ICD-10-CM

## 2019-10-03 DIAGNOSIS — I361 Nonrheumatic tricuspid (valve) insufficiency: Secondary | ICD-10-CM

## 2019-10-03 DIAGNOSIS — K4090 Unilateral inguinal hernia, without obstruction or gangrene, not specified as recurrent: Secondary | ICD-10-CM

## 2019-10-03 HISTORY — DX: Acute and chronic respiratory failure with hypoxia: J96.21

## 2019-10-03 LAB — CBC
HCT: 33.5 % — ABNORMAL LOW (ref 39.0–52.0)
Hemoglobin: 9.3 g/dL — ABNORMAL LOW (ref 13.0–17.0)
MCH: 25.5 pg — ABNORMAL LOW (ref 26.0–34.0)
MCHC: 27.8 g/dL — ABNORMAL LOW (ref 30.0–36.0)
MCV: 91.8 fL (ref 80.0–100.0)
Platelets: 232 10*3/uL (ref 150–400)
RBC: 3.65 MIL/uL — ABNORMAL LOW (ref 4.22–5.81)
RDW: 17 % — ABNORMAL HIGH (ref 11.5–15.5)
WBC: 6.4 10*3/uL (ref 4.0–10.5)
nRBC: 0 % (ref 0.0–0.2)

## 2019-10-03 LAB — ECHOCARDIOGRAM COMPLETE
Height: 70 in
Weight: 2960 oz

## 2019-10-03 LAB — PROTIME-INR
INR: 1.2 (ref 0.8–1.2)
Prothrombin Time: 15.4 seconds — ABNORMAL HIGH (ref 11.4–15.2)

## 2019-10-03 LAB — TROPONIN I (HIGH SENSITIVITY)
Troponin I (High Sensitivity): 39 ng/L — ABNORMAL HIGH (ref <18)
Troponin I (High Sensitivity): 36 ng/L — ABNORMAL HIGH (ref <18)

## 2019-10-03 LAB — BASIC METABOLIC PANEL
Anion gap: 8 (ref 5–15)
BUN: 19 mg/dL (ref 8–23)
CO2: 32 mmol/L (ref 22–32)
Calcium: 8.8 mg/dL — ABNORMAL LOW (ref 8.9–10.3)
Chloride: 102 mmol/L (ref 98–111)
Creatinine, Ser: 1.04 mg/dL (ref 0.61–1.24)
GFR calc Af Amer: >60 mL/min (ref >60)
GFR calc non Af Amer: >60 mL/min (ref >60)
Glucose, Bld: 115 mg/dL — ABNORMAL HIGH (ref 70–99)
Potassium: 4.3 mmol/L (ref 3.5–5.1)
Sodium: 142 mmol/L (ref 135–145)

## 2019-10-03 LAB — BRAIN NATRIURETIC PEPTIDE: B Natriuretic Peptide: 1334.6 pg/mL — ABNORMAL HIGH (ref 0.0–100.0)

## 2019-10-03 SURGERY — REPAIR, HERNIA, INGUINAL, ADULT
Anesthesia: General | Laterality: Left

## 2019-10-03 MED ORDER — ONDANSETRON HCL 4 MG/2ML IJ SOLN
INTRAMUSCULAR | Status: AC
  Filled 2019-10-03: qty 2

## 2019-10-03 MED ORDER — LORATADINE 10 MG PO TABS
10.0000 mg | ORAL_TABLET | Freq: Every day | ORAL | Status: DC
  Administered 2019-10-03 – 2019-10-07 (×5): 10 mg via ORAL
  Filled 2019-10-03 (×5): qty 1

## 2019-10-03 MED ORDER — CEFAZOLIN SODIUM-DEXTROSE 2-4 GM/100ML-% IV SOLN: 2.0000 g | INTRAVENOUS | Status: DC

## 2019-10-03 MED ORDER — ASPIRIN EC 81 MG PO TBEC
81.0000 mg | DELAYED_RELEASE_TABLET | Freq: Every day | ORAL | Status: DC
  Administered 2019-10-03 – 2019-10-07 (×5): 81 mg via ORAL
  Filled 2019-10-03 (×5): qty 1

## 2019-10-03 MED ORDER — FENTANYL CITRATE (PF) 100 MCG/2ML IJ SOLN
INTRAMUSCULAR | Status: AC
  Filled 2019-10-03: qty 2

## 2019-10-03 MED ORDER — ONDANSETRON HCL 4 MG/2ML IJ SOLN: 4.0000 mg | INTRAMUSCULAR | Status: AC

## 2019-10-03 MED ORDER — CEFAZOLIN SODIUM-DEXTROSE 2-4 GM/100ML-% IV SOLN
INTRAVENOUS | Status: AC
  Filled 2019-10-03: qty 100

## 2019-10-03 MED ORDER — HEPARIN (PORCINE) 25000 UT/250ML-% IV SOLN
1000.0000 [IU]/h | INTRAVENOUS | Status: DC
  Administered 2019-10-03 – 2019-10-04 (×2): 950 [IU]/h via INTRAVENOUS
  Filled 2019-10-03 (×3): qty 250

## 2019-10-03 MED ORDER — LACTATED RINGERS IV SOLN: INTRAVENOUS | Status: DC

## 2019-10-03 MED ORDER — SODIUM CHLORIDE 0.9% FLUSH: 3.0000 mL | INTRAVENOUS | Status: DC | PRN

## 2019-10-03 MED ORDER — ACETAMINOPHEN 500 MG PO TABS
ORAL_TABLET | ORAL | Status: AC
  Administered 2019-10-03: 1000 mg via ORAL
  Filled 2019-10-03: qty 2

## 2019-10-03 MED ORDER — FENTANYL CITRATE (PF) 250 MCG/5ML IJ SOLN
INTRAMUSCULAR | Status: AC
  Filled 2019-10-03: qty 5

## 2019-10-03 MED ORDER — METOPROLOL TARTRATE 12.5 MG HALF TABLET
12.5000 mg | ORAL_TABLET | Freq: Every day | ORAL | Status: DC
  Administered 2019-10-03: 22:00:00 12.5 mg via ORAL
  Filled 2019-10-03: qty 1

## 2019-10-03 MED ORDER — GABAPENTIN 300 MG PO CAPS: 300.0000 mg | ORAL_CAPSULE | ORAL | Status: AC

## 2019-10-03 MED ORDER — SODIUM CHLORIDE 0.9 % IV SOLN: 250.0000 mL | INTRAVENOUS | Status: DC | PRN

## 2019-10-03 MED ORDER — CHLORHEXIDINE GLUCONATE CLOTH 2 % EX PADS: 6.0000 | MEDICATED_PAD | Freq: Once | CUTANEOUS | Status: DC

## 2019-10-03 MED ORDER — IOHEXOL 350 MG/ML SOLN
80.0000 mL | Freq: Once | INTRAVENOUS | Status: AC | PRN
  Administered 2019-10-03: 15:00:00 80 mL via INTRAVENOUS

## 2019-10-03 MED ORDER — ALBUTEROL SULFATE (2.5 MG/3ML) 0.083% IN NEBU
INHALATION_SOLUTION | RESPIRATORY_TRACT | Status: AC
  Administered 2019-10-03: 2.5 mg
  Filled 2019-10-03: qty 3

## 2019-10-03 MED ORDER — ACETAMINOPHEN 500 MG PO TABS: 1000.0000 mg | ORAL_TABLET | ORAL | Status: AC

## 2019-10-03 MED ORDER — FUROSEMIDE 10 MG/ML IJ SOLN
40.0000 mg | Freq: Two times a day (BID) | INTRAMUSCULAR | Status: DC
  Administered 2019-10-03 – 2019-10-07 (×8): 40 mg via INTRAVENOUS
  Filled 2019-10-03 (×8): qty 4

## 2019-10-03 MED ORDER — GABAPENTIN 300 MG PO CAPS
ORAL_CAPSULE | ORAL | Status: AC
  Administered 2019-10-03: 08:00:00 300 mg via ORAL
  Filled 2019-10-03: qty 1

## 2019-10-03 MED ORDER — PANTOPRAZOLE SODIUM 40 MG PO TBEC
40.0000 mg | DELAYED_RELEASE_TABLET | Freq: Every day | ORAL | Status: DC
  Administered 2019-10-04 – 2019-10-07 (×4): 40 mg via ORAL
  Filled 2019-10-03 (×4): qty 1

## 2019-10-03 MED ORDER — LIDOCAINE 2% (20 MG/ML) 5 ML SYRINGE
INTRAMUSCULAR | Status: AC
  Filled 2019-10-03: qty 5

## 2019-10-03 MED ORDER — POTASSIUM CHLORIDE CRYS ER 20 MEQ PO TBCR
40.0000 meq | EXTENDED_RELEASE_TABLET | Freq: Every day | ORAL | Status: DC
  Administered 2019-10-03 – 2019-10-07 (×5): 40 meq via ORAL
  Filled 2019-10-03 (×5): qty 2

## 2019-10-03 MED ORDER — AMIODARONE HCL 200 MG PO TABS
200.0000 mg | ORAL_TABLET | Freq: Every day | ORAL | Status: DC
  Administered 2019-10-04 – 2019-10-07 (×4): 200 mg via ORAL
  Filled 2019-10-03 (×4): qty 1

## 2019-10-03 MED ORDER — METHOCARBAMOL 500 MG PO TABS
500.0000 mg | ORAL_TABLET | Freq: Three times a day (TID) | ORAL | Status: DC | PRN
  Administered 2019-10-03: 22:00:00 500 mg via ORAL
  Filled 2019-10-03 (×2): qty 1

## 2019-10-03 MED ORDER — ACETAMINOPHEN 325 MG PO TABS
650.0000 mg | ORAL_TABLET | ORAL | Status: DC | PRN
  Administered 2019-10-04 – 2019-10-07 (×2): 650 mg via ORAL
  Filled 2019-10-03 (×3): qty 2

## 2019-10-03 MED ORDER — ATORVASTATIN CALCIUM 10 MG PO TABS
10.0000 mg | ORAL_TABLET | Freq: Every morning | ORAL | Status: DC
  Administered 2019-10-04 – 2019-10-07 (×4): 10 mg via ORAL
  Filled 2019-10-03 (×4): qty 1

## 2019-10-03 MED ORDER — ONDANSETRON HCL 4 MG/2ML IJ SOLN: 4.0000 mg | Freq: Four times a day (QID) | INTRAMUSCULAR | Status: DC | PRN

## 2019-10-03 MED ORDER — OXYCODONE HCL 5 MG PO TABS
5.0000 mg | ORAL_TABLET | Freq: Four times a day (QID) | ORAL | Status: DC | PRN
  Filled 2019-10-03: qty 2

## 2019-10-03 MED ORDER — HEPARIN BOLUS VIA INFUSION
2000.0000 [IU] | Freq: Once | INTRAVENOUS | Status: AC
  Administered 2019-10-03: 2000 [IU] via INTRAVENOUS
  Filled 2019-10-03: qty 2000

## 2019-10-03 MED ORDER — ENOXAPARIN SODIUM 80 MG/0.8ML ~~LOC~~ SOLN: 80.0000 mg | Freq: Two times a day (BID) | SUBCUTANEOUS | Status: DC

## 2019-10-03 MED ORDER — MIDAZOLAM HCL 2 MG/2ML IJ SOLN
INTRAMUSCULAR | Status: AC
  Filled 2019-10-03: qty 2

## 2019-10-03 MED ORDER — SODIUM CHLORIDE 0.9% FLUSH
3.0000 mL | Freq: Two times a day (BID) | INTRAVENOUS | Status: DC
  Administered 2019-10-03 – 2019-10-06 (×5): 3 mL via INTRAVENOUS

## 2019-10-03 MED ORDER — ONDANSETRON HCL 4 MG/2ML IJ SOLN
INTRAMUSCULAR | Status: AC
  Administered 2019-10-03: 4 mg via INTRAVENOUS
  Filled 2019-10-03: qty 2

## 2019-10-03 MED ORDER — DEXAMETHASONE SODIUM PHOSPHATE 10 MG/ML IJ SOLN
INTRAMUSCULAR | Status: AC
  Filled 2019-10-03: qty 1

## 2019-10-03 MED ORDER — PROPOFOL 10 MG/ML IV BOLUS
INTRAVENOUS | Status: AC
  Filled 2019-10-03: qty 20

## 2019-10-03 NOTE — Progress Notes (Addendum)
Dr. [name] back to re-evaluate the pt. O2 turned off to evaluate saturation on r/a. Pt tolerating well, but is still working to breath. O2 sat between 88-91% on r/a.

## 2019-10-03 NOTE — Progress Notes (Signed)
[name] wife [name] given an update on the pt's condition and the plan of care, thus far. Will update as information becomes available.

## 2019-10-03 NOTE — Progress Notes (Signed)
Pt arrived to 6N04 via stretcher from Short Stay. Received report from Hodge, Charity fundraiser. See assessment. Will continue to monitor.

## 2019-10-03 NOTE — H&P (Signed)
[name] [name] is an 79 y.o. male.   Chief Complaint: Left inguinal hernia HPI: Presents for repair of left inguinal hernia with mesh. He held his Eliquis after Wednesday.  Past Medical History:  Diagnosis Date  . Aortic valve insufficiency   . Arthritis    "probably in my knees before they replaced them" (01/28/2015)  . Atrial flutter ([place])   . CAD (coronary artery disease)   . CHF (congestive heart failure) ([place])   . Descending aortic aneurysm ([place])   . DVT (deep venous thrombosis) ([place])    ?LLE post knee surgery  . History of blood transfusion    "after one of my knee surgeries"  . HTN (hypertension)   . Hyperlipidemia   . Thoracic aneurysm   . Tobacco abuse     Past Surgical History:  Procedure Laterality Date  . AORTIC VALVE REPLACEMENT  02/19/2009  . AORTO-FEMORAL BYPASS GRAFT  03/2010   ascending aortic and arch aneurysm repair/notes 04/09/2009  . CARDIAC VALVE REPLACEMENT    . CAROTID-SUBCLAVIAN BYPASS GRAFT Left 08/26/2013   Procedure: BYPASS GRAFT CAROTID-SUBCLAVIAN;  Surgeon: Serafina Mitchell, MD;  Location: [facility];  Service: Vascular;  Laterality: Left;  . CATARACT EXTRACTION W/ INTRAOCULAR LENS  IMPLANT, BILATERAL Bilateral   . ENDOVASCULAR STENT INSERTION N/A 08/26/2013   Procedure:  THORACIC STENT GRAFT INSERTION;  Surgeon: Serafina Mitchell, MD;  Location: [facility];  Service: Vascular;  Laterality: N/A;  . EYE SURGERY    . JOINT REPLACEMENT    . KNEE ARTHROSCOPY Bilateral   . REPLACEMENT TOTAL KNEE Bilateral   . right groin lymphocele Right 03/29/2009  . THORACENTESIS  2010 X 2  . TONSILLECTOMY      Family History  Problem Relation Age of Onset  . Celiac disease Daughter   . Aortic aneurysm Father   . Hypertension Mother   . Heart attack Neg Hx   . Stroke Neg Hx    Social History:  reports that he quit smoking about 45 years ago. His smoking use included cigarettes. He smoked 0.00 packs per day. He has never used smokeless tobacco. He reports that he does  not drink alcohol or use drugs.  Allergies:  Allergies  Allergen Reactions  . Lortab [Hydrocodone-Acetaminophen] Hives    No trouble breathing  . Morphine And Related Hives  . Other Other (See Comments)    [place] from surgery caused infection    Medications Prior to Admission  Medication Sig Dispense Refill  . acetaminophen (TYLENOL) 500 MG tablet Take 2 tablets (1,000 mg total) by mouth every 6 (six) hours as needed. 30 tablet 0  . amiodarone (PACERONE) 200 MG tablet Take 1 tablet (200 mg total) by mouth daily. 30 tablet 11  . apixaban (ELIQUIS) 5 MG TABS tablet Take 1 tablet (5 mg total) by mouth 2 (two) times daily. 180 tablet 1  . atorvastatin (LIPITOR) 10 MG tablet Take 1 tablet by mouth once daily (Patient taking differently: Take 10 mg by mouth every morning. ) 90 tablet 3  . furosemide (LASIX) 40 MG tablet Take 1 tablet by mouth twice daily (Patient taking differently: Take 40 mg by mouth 2 (two) times daily. ) 180 tablet 3  . lansoprazole (PREVACID) 30 MG capsule Take 30 mg by mouth daily.    Marland Kitchen loratadine (CLARITIN) 10 MG tablet Take 1 tablet (10 mg total) by mouth daily. 30 tablet 11  . methocarbamol (ROBAXIN) 500 MG tablet Take 2 tablets (1,000 mg total) by mouth every 8 (  eight) hours as needed for muscle spasms. (Patient taking differently: Take 500 mg by mouth every 8 (eight) hours as needed for muscle spasms. ) 30 tablet 0  . metoprolol tartrate (LOPRESSOR) 25 MG tablet Take 1 tablet (25 mg total) by mouth 2 (two) times daily. (Patient taking differently: Take 12.5 mg by mouth at bedtime. ) 60 tablet 11  . Multiple Vitamin (MULTIVITAMIN WITH MINERALS) TABS tablet Take 1 tablet by mouth daily.    Marland Kitchen oxyCODONE (OXY IR/ROXICODONE) 5 MG immediate release tablet Take 1-2 tablets (5-10 mg total) by mouth every 6 (six) hours as needed for moderate pain. 30 tablet 0  . potassium chloride SA (KLOR-CON) 20 MEQ tablet Take 2 tablets by mouth once daily (Patient taking differently: Take 40  mEq by mouth daily. ) 180 tablet 3  . nitroGLYCERIN (NITROSTAT) 0.4 MG SL tablet Place 1 tablet (0.4 mg total) under the tongue every 5 (five) minutes as needed for chest pain (X3 DOSES BEFORE CALLING 911). 25 tablet 3    No results found for this or any previous visit (from the past 48 hour(s)). No results found.  Review of Systems  Blood pressure (!) 164/93, pulse 93, temperature 98.7 F (37.1 C), temperature source Oral, resp. rate 18, height 5\' 10"  (1.778 m), weight 83.9 kg, SpO2 100 %. Physical Exam  Constitutional: He is oriented to person, place, and time. He appears well-developed and well-nourished.  HENT:  Head: Normocephalic.  Eyes: Right eye exhibits no discharge. Left eye exhibits no discharge.  Neck: No thyromegaly present.  Cardiovascular:  irreg irreg  Respiratory: No respiratory distress. He has no wheezes. He exhibits tenderness.  R rib tenderness  GI: Soft. He exhibits no distension. There is no abdominal tenderness. There is no rebound and no guarding.  Genitourinary:    Genitourinary Comments: Large left inguinal hernia into scrotum   Musculoskeletal:        General: Normal range of motion.     Cervical back: Neck supple.  Neurological: He is alert and oriented to person, place, and time.  Skin: Skin is warm.  Psychiatric: He has a normal mood and affect.     Assessment/Plan Large left inguinal hernia - for repair with mesh. Procedure, risks, and benefits again discussed and he agrees. Plan overnight obs.  , MD 10/03/2019, 8:27 AM

## 2019-10-03 NOTE — Progress Notes (Signed)
  Echocardiogram 2D Echocardiogram has been performed.  [name] [name] 10/03/2019, 1:43 PM

## 2019-10-03 NOTE — Progress Notes (Signed)
ANTICOAGULATION CONSULT NOTE - Initial Consult  Pharmacy Consult for heparin Indication: atrial fibrillation  Patient Measurements: Height: 5\' 10"  (177.8 cm) Weight: 83.9 kg (185 lb) IBW/kg (Calculated) : 73  Vital Signs: Temp: 98.4 F (36.9 C) (04/16 1406) Temp Source: Oral (04/16 1406) BP: 110/64 (04/16 1406) Pulse Rate: 105 (04/16 1406)  Labs: Recent Labs    10/03/19 0741 10/03/19 1617  HGB  --  9.3*  HCT  --  33.5*  PLT  --  232  LABPROT 15.4*  --   INR 1.2  --   CREATININE  --  1.04  TROPONINIHS  --  39*    Estimated Creatinine Clearance: 58.5 mL/min (by C-G formula based on SCr of 1.04 mg/dL).   Assessment: 79 yo M on apixaban PTA for afib/flutter (CHADS2VASc = 4), LD 4/14 admitted for repair of L inguinal hernia on 4/16 - during pre-op eval became short of breath with acute HF   Hernia surgery cancelled and likely cath on Monday  Cards dc enox and requested hep gtt   Goal of Therapy:  Hep lvl 0.3 - 0.7 APTT 66 - 102 Monitor platelets by anticoagulation protocol: Yes   Plan:  Dc enox Heparin bolus 2000 units x 1 - will give 1/2 bolus since last apixaban dose sometime on 4/14 Hep gtt 950 units/hr Initial hep lvl aptt 0200  5/14, PharmD, BCPS, BCCCP Clinical Pharmacist [phone]  Please check AMION for all [facility] Pharmacy numbers  10/03/2019 5:42 PM

## 2019-10-03 NOTE — Progress Notes (Signed)
During pre-op evaulation including CXR patient developed acute SOB, wheezing, and chest pain. I discussed with Dr. Noreene Larsson and we both agree we cannot proceed with the planned surgery. EKG and breathing treatment ordered. Surgery cancelled. I tried to call his wife but got no answer. I will try again.  Violeta Gelinas, MD, MPH, FACS Please use [email or web address] to contact on call provider

## 2019-10-03 NOTE — Consult Note (Addendum)
Cardiology Consultation:   Patient ID: [name] MRN: [id number]; DOB: Apr 16, 1941  Admit date: 10/03/2019 Date of Consult: 10/03/2019  Primary Care Provider: Casper Harrison, [name], MD Primary Cardiologist: Lance Muss, MD  Primary Electrophysiologist:  None    Patient Profile:   [name] [name] is a 79 y.o. 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 He also has a history of Type B aortic dissection with endo-therapy of dissection and left subclavian to left carotid bypass. PAF/flutter anticoagulated with eliquis and amiodarone.  He had a mechanical fall three weeks ago and broke three ribs on the right side.   He presented today for hernia repair surgery. Unfortunately, while being evaluated by anesthesia, he became acutely short of breath requiring supplemental O2. Surgery canceled and he was admitted to the hospitalist service. Echocardiogram today with EF 25-30% with global hypokinesis. He does not report dietary  Indiscretion, lower extremity edema, or SOB. BNP was elevated 1335. CXR with pulmonary vascular congestion and trace left pleural effusion.   Echo in 2015 with normal EF.  Last eliquis dose was Wed night.    Past Medical History:  Diagnosis Date   Aortic valve insufficiency    Arthritis    "probably in my knees before they replaced them" (01/28/2015)   Atrial flutter (HCC)    CAD (coronary artery disease)    CHF (congestive heart failure) (HCC)    Descending aortic aneurysm  (HCC)    DVT (deep venous thrombosis) (HCC)    ?LLE post knee surgery   History of blood transfusion    "after one of my knee surgeries"   HTN (hypertension)    Hyperlipidemia    Thoracic aneurysm    Tobacco abuse     Past Surgical History:  Procedure Laterality Date   AORTIC VALVE REPLACEMENT  02/19/2009   AORTO-FEMORAL BYPASS GRAFT  03/2010   ascending aortic and arch aneurysm repair/notes 04/09/2009   CARDIAC VALVE REPLACEMENT     CAROTID-SUBCLAVIAN BYPASS GRAFT Left 08/26/2013   Procedure: BYPASS GRAFT CAROTID-SUBCLAVIAN;  Surgeon: Nada Libman, MD;  Location: [facility] OR;  Service: Vascular;  Laterality: Left;   CATARACT EXTRACTION W/ INTRAOCULAR LENS  IMPLANT, BILATERAL Bilateral    ENDOVASCULAR STENT INSERTION N/A 08/26/2013   Procedure:  THORACIC STENT GRAFT INSERTION;  Surgeon: Nada Libman, MD;  Location: [facility] OR;  Service: Vascular;  Laterality: N/A;   EYE SURGERY     JOINT REPLACEMENT     KNEE ARTHROSCOPY Bilateral    REPLACEMENT TOTAL KNEE Bilateral    right groin lymphocele Right 03/29/2009   THORACENTESIS  2010 X 2   TONSILLECTOMY       Home Medications:  Prior to Admission medications   Medication Sig Start Date End Date Taking? Authorizing Provider  acetaminophen (TYLENOL) 500 MG tablet Take 2 tablets (1,000 mg total) by mouth every 6 (six) hours as needed. 09/08/19  Yes Barnetta Chapel, PA-C  amiodarone (PACERONE) 200 MG tablet Take 1 tablet (200 mg total) by mouth daily.  05/29/19  Yes [name], MD  apixaban (ELIQUIS) 5 MG TABS tablet Take 1 tablet (5 mg total) by mouth 2 (two) times daily. 05/29/19  Yes [name], MD  atorvastatin (LIPITOR) 10 MG tablet Take 1 tablet by mouth once daily Patient taking differently: Take 10 mg by mouth every morning.  06/16/19  Yes [name], MD  furosemide (LASIX) 40 MG tablet Take 1 tablet by mouth twice daily Patient taking differently: Take 40 mg by mouth 2 (two) times daily.  06/02/19  Yes  [name], MD  lansoprazole (PREVACID) 30 MG capsule Take 30 mg by mouth daily. 08/13/19  Yes [provider]  loratadine (CLARITIN) 10 MG tablet Take 1 tablet (10 mg total) by mouth daily. 06/01/14  Yes [name], MD  methocarbamol (ROBAXIN) 500 MG tablet Take 2 tablets (1,000 mg total) by mouth every 8 (eight) hours as needed for muscle spasms. Patient taking differently: Take 500 mg by mouth every 8 (eight) hours as needed for muscle spasms.  09/08/19  Yes Barnetta Chapel, PA-C  metoprolol tartrate (LOPRESSOR) 25 MG tablet Take 1 tablet (25 mg total) by mouth 2 (two) times daily. Patient taking differently: Take 12.5 mg by mouth at bedtime.  05/29/19  Yes [name], MD  Multiple Vitamin (MULTIVITAMIN WITH MINERALS) TABS tablet Take 1 tablet by mouth daily.   Yes [provider]  oxyCODONE (OXY IR/ROXICODONE) 5 MG immediate release tablet Take 1-2 tablets (5-10 mg total) by mouth every 6 (six) hours as needed for moderate pain. 09/08/19  Yes Barnetta Chapel, PA-C  potassium chloride SA (KLOR-CON) 20 MEQ tablet Take 2 tablets by mouth once daily Patient taking differently: Take 40 mEq by mouth daily.  06/16/19  Yes [name], MD  nitroGLYCERIN (NITROSTAT) 0.4 MG SL tablet Place 1 tablet (0.4 mg total) under the tongue every 5 (five) minutes as needed for chest pain (X3 DOSES BEFORE CALLING 911). 05/29/19   [name], MD    Inpatient Medications: Scheduled Meds:  [START ON 10/04/2019] amiodarone  200 mg Oral Daily   [START ON 10/04/2019] atorvastatin  10 mg Oral q morning - 10a   [START ON 10/04/2019] enoxaparin (LOVENOX) injection  80 mg Subcutaneous Q12H   furosemide  40 mg Intravenous BID   loratadine  10 mg Oral Daily   metoprolol tartrate  12.5 mg Oral QHS   [START ON 10/04/2019] pantoprazole  40 mg Oral Daily   potassium chloride SA  40 mEq Oral Daily   sodium chloride flush  3 mL Intravenous Q12H   Continuous  Infusions:  sodium chloride     PRN Meds: sodium chloride, acetaminophen, methocarbamol, ondansetron (ZOFRAN) IV, oxyCODONE, sodium chloride flush  Allergies:    Allergies  Allergen Reactions   Lortab [Hydrocodone-Acetaminophen] Hives    No trouble breathing   Morphine And Related Hives   Other Other (See Comments)    Staples from surgery caused infection    Social History:   Social History   Socioeconomic History   Marital status: Married    Spouse name: Not on file   Number of children: Not on file   Years of education: Not on file   Highest education level: Not on file  Occupational History   Not on file  Tobacco Use   Smoking status: Former Smoker    Packs/day: 0.00    Types: Cigarettes    Quit date: 04/16/1974    Years since quitting: 45.4  Smokeless tobacco: Never Used  Substance and Sexual Activity   Alcohol use: No   Drug use: No   Sexual activity: Never  Other Topics Concern   Not on file  Social History Narrative   Not on file   Social Determinants of Health   Financial Resource Strain:    Difficulty of Paying Living Expenses:   Food Insecurity:    Worried About Charity fundraiser in the Last Year:    Arboriculturist in the Last Year:   Transportation Needs:    Film/video editor (Medical):    Lack of Transportation (Non-Medical):   Physical Activity:    Days of Exercise per Week:    Minutes of Exercise per Session:   Stress:    Feeling of Stress :   Social Connections:    Frequency of Communication with Friends and Family:    Frequency of Social Gatherings with Friends and Family:    Attends Religious Services:    Active Member of Clubs or Organizations:    Attends Music therapist:    Marital Status:   Intimate Partner Violence:    Fear of Current or Ex-Partner:    Emotionally Abused:    Physically Abused:    Sexually Abused:     Family History:    Family History  Problem Relation Age of Onset   Celiac disease  Daughter    Aortic aneurysm Father    Hypertension Mother    Heart attack Neg Hx    Stroke Neg Hx      ROS:  Please see the history of present illness.   All other ROS reviewed and negative.     Physical Exam/Data:   Vitals:   10/03/19 0925 10/03/19 1030 10/03/19 1144 10/03/19 1406  BP: (!) 160/63 123/61 121/66 110/64  Pulse: (!) 105 (!) 108 (!) 106 (!) 105  Resp: (!) 30 17 17 16   Temp:  98.4 F (36.9 C) 99 F (37.2 C) 98.4 F (36.9 C)  TempSrc:  Oral Oral Oral  SpO2: 97% 99% 100% 100%  Weight:      Height:        Intake/Output Summary (Last 24 hours) at 10/03/2019 1637 Last data filed at 10/03/2019 1211 Gross per 24 hour  Intake 3 ml  Output --  Net 3 ml   Last 3 Weights 10/03/2019 09/30/2019 09/04/2019  Weight (lbs) 185 lb 185 lb 4.8 oz 175 lb  Weight (kg) 83.915 kg 84.052 kg 79.379 kg     Body mass index is 26.54 kg/m.  General:  Elderly male, NAD, asleep HEENT: normal Lymph: no adenopathy Neck: + JVD Endocrine:  No thryomegaly Vascular: No carotid bruits; FA pulses 2+ bilaterally without bruits  Cardiac:  Irregular rhythm, regular rate Lungs:  clear to auscultation bilaterally, no wheezing, rhonchi or rales  Abd: soft, nontender, no hepatomegaly  Ext: + B LE edema Musculoskeletal:  No deformities, BUE and BLE strength normal and equal Skin: warm and dry  Neuro:  CNs 2-12 intact, no focal abnormalities noted Psych:  Normal affect   EKG:  The EKG was personally reviewed and demonstrates:  Afib with ventricular rate 111 Telemetry:  Telemetry was personally reviewed and demonstrates:  Episodes of atrial flutter, afib and sinus - rate controlled in the 70-80s currenlty  Relevant CV Studies:  Echo 10/02/19: 1. Diffuse hypokinesis worse in the septum, apex and akinesis of inferior  base . Left ventricular ejection fraction, by estimation, is 25 to 30%.  The left ventricle has severely decreased function. The left ventricle  demonstrates global hypokinesis. The    left ventricular internal cavity size was mildly dilated. Left  ventricular diastolic parameters are indeterminate.   2. Right ventricular systolic function is normal. The right ventricular  size is normal. There is normal pulmonary artery systolic pressure.   3. Left atrial size was moderately dilated.   4. Right atrial size was moderately dilated.   5. The mitral valve is degenerative. Mild mitral valve regurgitation.   6. By history bioprosthetic AVR with trivial central AR and acceptable  mean gradient of 7 mmHg . The aortic valve has been repaired/replaced.  Aortic valve regurgitation is trivial. No aortic stenosis is present.  There is a unknown a bioprosthetic valve  present in the aortic position.   7. ? dilated descending thoracic aorta seen on PsL views.  Laboratory Data:  High Sensitivity Troponin:  No results for input(s): TROPONINIHS in the last 720 hours.   Chemistry Recent Labs  Lab 09/30/19 1048  NA 143  K 4.6  CL 105  CO2 26  GLUCOSE 105*  BUN 19  CREATININE 1.02  CALCIUM 9.2  GFRNONAA >60  GFRAA >60  ANIONGAP 12    No results for input(s): PROT, ALBUMIN, AST, ALT, ALKPHOS, BILITOT in the last 168 hours. Hematology Recent Labs  Lab 09/30/19 1048  WBC 6.5  RBC 3.70*  HGB 9.7*  HCT 34.6*  MCV 93.5  MCH 26.2  MCHC 28.0*  RDW 16.8*  PLT 239   BNP Recent Labs  Lab 10/03/19 1048  BNP 1,334.6*    DDimer No results for input(s): DDIMER in the last 168 hours.   Radiology/Studies:  CT ANGIO CHEST PE W OR WO CONTRAST  Result Date: 10/03/2019 CLINICAL DATA:  Shortness of breath. EXAM: CT ANGIOGRAPHY CHEST WITH CONTRAST TECHNIQUE: Multidetector CT imaging of the chest was performed using the standard protocol during bolus administration of intravenous contrast. Multiplanar CT image reconstructions and MIPs were obtained to evaluate the vascular anatomy. CONTRAST:  45mL OMNIPAQUE IOHEXOL 350 MG/ML SOLN COMPARISON:  09/04/2019. FINDINGS:  Cardiovascular: The heart remains enlarged with dense coronary artery calcifications. An aortic stent is unchanged. The distal aortic arch and descending thoracic aorta remains diffusely enlarged. The distal arch measures 5.0 cm in maximum diameter without significant change since 09/04/2019. The lower lung zone pulmonary arteries are poorly opacified with poor mixing of opacified and unopacified blood. No definite pulmonary arterial filling defects are confirmed in 3 planes. The remainder of the pulmonary arteries are normally opacified with no pulmonary arterial filling defects seen. Mediastinum/Nodes: No enlarged mediastinal, hilar, or axillary lymph nodes. Thyroid gland, trachea, and esophagus demonstrate no significant findings. Lungs/Pleura: Interval small bilateral pleural effusions. Minimal patchy ground-glass opacity is again demonstrated in both lungs, remaining greater on the right. Mild bilateral dependent atelectasis. Interval small oval area of patchy density in the periphery of the right upper lobe measuring 9.6 mm on image number 54 series 6. A previously demonstrated 9 mm solid right upper lobe nodule on image number 56 series 6 is unchanged. This not changed significantly since 02/18/2009. No new nodules are seen. Upper Abdomen: A heterogeneous mass arising from the upper pole of the right kidney has not changed significantly, measuring 2.7 cm in maximum diameter on image number 147 series 5. This was previously stable over multiple examinations other than new soft tissue density. Musculoskeletal: Multiple mildly displaced right posterior rib fractures are again demonstrated. Some of these are healing and  others demonstrate no significant callus formation. Review of the MIP images confirms the above findings. IMPRESSION: 1. The lower lung zone pulmonary arteries are poorly opacified with poor mixing of opacified and unopacified blood. No definite pulmonary emboli are seen. 2. Interval small  bilateral pleural effusions. 3. Interval small oval area of patchy density in the periphery of the right upper lobe. This could represent a small area of infection or inflammation. 4. Stable 9 mm solid right upper lobe nodule since 02/18/2009. The long-term stability is compatible with a benign process and this does not need follow-up. 5. Stable cardiomegaly and dense coronary artery atherosclerosis. 6. 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 7. Multiple mildly displaced right posterior rib fractures, some of which are healing and others demonstrate no significant callus formation. 8. Stable 2.7 cm heterogeneous mass arising from the upper pole of the right kidney, possibly representing a hemorrhagic cyst as previously discussed. The current appearance can also be seen with malignancy. This could be further evaluated with pre and postcontrast magnetic resonance imaging of the kidneys on an outpatient basis when the patient is able to follow breathing instructions Electronically Signed   By: Beckie Salts M.D.   On: 10/03/2019 15:23   DG CHEST PORT 1 VIEW  Result Date: 10/03/2019 CLINICAL DATA:  Preop exam. Additional history provided: Preop exam for left hernia repair, labored breathing, known right broken ribs. EXAM: PORTABLE CHEST 1 VIEW COMPARISON:  Prior chest radiograph 09/08/2019 and earlier FINDINGS: Unchanged cardiomegaly. Prior median sternotomy. Redemonstrated aortic valve replacement and aortic stent graft. Pulmonary vascular congestion with interstitial prominence. No definite right pleural effusion. A small left pleural effusion is difficult to exclude. No evidence of pneumothorax. Known recent right-sided rib fractures were better appreciated on prior chest CT 09/04/2019. IMPRESSION: Unchanged cardiomegaly with aortic valve replacement and aortic stent graft. Pulmonary vascular congestion. Interstitial prominence has progressed as compared to  prior exam 09/08/2019 and likely reflects interstitial edema. Atypical/viral pneumonia cannot be excluded. Possible trace left pleural effusion. Electronically Signed   By: Jackey Loge DO   On: 10/03/2019 09:09   ECHOCARDIOGRAM COMPLETE  Result Date: 10/03/2019    ECHOCARDIOGRAM REPORT   Patient Name:   [name] [name] Date of Exam: 10/03/2019 Medical Rec #:  [id number]         Height:       70.0 in Accession #:    [id number]        Weight:       185.0 lb Date of Birth:  10-16-1940         BSA:          2.019 m Patient Age:    79 years          BP:           121/66 mmHg Patient Gender: M                 HR:           98 bpm. Exam Location:  Inpatient Procedure: 2D Echo, Cardiac Doppler and Color Doppler Indications:    R07.9* Chest pain, unspecified  History:        Patient has prior history of Echocardiogram examinations, most                 recent 08/23/2013. CHF, CAD, Aortic Valve Disease,                 Arrythmias:Atrial Flutter; Risk Factors:Former  Smoker,                 Hypertension and Dyslipidemia. DVT. Thoracic Aortic Aneurysm.                 Aortic Valve: unknown a bioprosthetic valve is present in the                 aortic position.  Sonographer:    Tiffany Dance Referring Phys: [id number] RONDELL A SMITH IMPRESSIONS  1. Diffuse hypokinesis worse in the septum, apex and akinesis of inferior base . Left ventricular ejection fraction, by estimation, is 25 to 30%. The left ventricle has severely decreased function. The left ventricle demonstrates global hypokinesis. The  left ventricular internal cavity size was mildly dilated. Left ventricular diastolic parameters are indeterminate.  2. Right ventricular systolic function is normal. The right ventricular size is normal. There is normal pulmonary artery systolic pressure.  3. Left atrial size was moderately dilated.  4. Right atrial size was moderately dilated.  5. The mitral valve is degenerative. Mild mitral valve regurgitation.  6. By history  bioprosthetic AVR with trivial central AR and acceptable mean gradient of 7 mmHg . The aortic valve has been repaired/replaced. Aortic valve regurgitation is trivial. No aortic stenosis is present. There is a unknown a bioprosthetic valve present in the aortic position.  7. ? dilated descending thoracic aorta seen on PsL views. FINDINGS  Left Ventricle: Diffuse hypokinesis worse in the septum, apex and akinesis of inferior base. Left ventricular ejection fraction, by estimation, is 25 to 30%. The left ventricle has severely decreased function. The left ventricle demonstrates global hypokinesis. The left ventricular internal cavity size was mildly dilated. There is no left ventricular hypertrophy. Left ventricular diastolic parameters are indeterminate. Right Ventricle: The right ventricular size is normal. Right vetricular wall thickness was not assessed. Right ventricular systolic function is normal. There is normal pulmonary artery systolic pressure. The tricuspid regurgitant velocity is 2.32 m/s, and with an assumed right atrial pressure of 3 mmHg, the estimated right ventricular systolic pressure is 24.5 mmHg. Left Atrium: Left atrial size was moderately dilated. Right Atrium: Right atrial size was moderately dilated. Pericardium: There is no evidence of pericardial effusion. Mitral Valve: The mitral valve is degenerative in appearance. There is mild thickening of the mitral valve leaflet(s). There is mild calcification of the mitral valve leaflet(s). Moderate mitral annular calcification. Mild mitral valve regurgitation. Tricuspid Valve: The tricuspid valve is normal in structure. Tricuspid valve regurgitation is mild. Aortic Valve: By history bioprosthetic AVR with trivial central AR and acceptable mean gradient of 7 mmHg. The aortic valve has been repaired/replaced. Aortic valve regurgitation is trivial. No aortic stenosis is present. Aortic valve mean gradient measures 6.7 mmHg. Aortic valve peak gradient  measures 13.7 mmHg. Aortic valve area, by VTI measures 2.05 cm. There is a unknown a bioprosthetic valve present in the aortic position. Pulmonic Valve: The pulmonic valve was normal in structure. Pulmonic valve regurgitation is mild. Aorta: ? dilated descending thoracic aorta seen on PsL views. The aortic root is normal in size and structure. IAS/Shunts: The interatrial septum was not well visualized.  LEFT VENTRICLE PLAX 2D LVIDd:         5.60 cm LVIDs:         4.30 cm LV PW:         1.40 cm LV IVS:        1.10 cm LVOT diam:     2.30 cm LV SV:  72 LV SV Index:   36 LVOT Area:     4.15 cm  RIGHT VENTRICLE             IVC RV Basal diam:  3.80 cm     IVC diam: 2.90 cm RV Mid diam:    1.90 cm RV S prime:     11.30 cm/s TAPSE (M-mode): 2.2 cm LEFT ATRIUM              Index       RIGHT ATRIUM           Index LA diam:        4.70 cm  2.33 cm/m  RA Area:     28.70 cm LA Vol (A2C):   150.0 ml 74.28 ml/m RA Volume:   93.30 ml  46.20 ml/m LA Vol (A4C):   82.7 ml  40.95 ml/m LA Biplane Vol: 120.0 ml 59.43 ml/m  AORTIC VALVE AV Area (Vmax):    2.53 cm AV Area (Vmean):   2.52 cm AV Area (VTI):     2.05 cm AV Vmax:           185.33 cm/s AV Vmean:          113.600 cm/s AV VTI:            0.353 m AV Peak Grad:      13.7 mmHg AV Mean Grad:      6.7 mmHg LVOT Vmax:         113.00 cm/s LVOT Vmean:        69.000 cm/s LVOT VTI:          0.174 m LVOT/AV VTI ratio: 0.49  AORTA Ao Root diam: 3.90 cm Ao Asc diam:  3.20 cm MITRAL VALVE               TRICUSPID VALVE MV Area (PHT): 3.69 cm    TR Peak grad:   21.5 mmHg MV Decel Time: 206 msec    TR Vmax:        232.00 cm/s MV E velocity: 90.65 cm/s                            SHUNTS                            Systemic VTI:  0.17 m                            Systemic Diam: 2.30 cm Charlton Haws MD Electronically signed by Charlton Haws MD Signature Date/Time: 10/03/2019/3:33:27 PM    Final    {  Assessment and Plan:   1. New onset systolic heart failure - he is maintained  on 40 mg BID lasix at home - CXR with pulmonary congestion, BNP elevated - agree with lasix - continue 40 mg IV lasix BID - normal renal function - will plan for right and left heart cath on Monday - he has a history of left subclavian-carotid bypass   2. Paroxysmal atrial fibrillation - anticoagulated with eliquis - will start heparin drip prior to heart cath on Monday - continue amiodarone and BB   3. Hx of AVR - stable valve function on echo   4. Large inguinal hernia - strict I&Os - per wife, may have trouble urinating - surgery postponed for now  For questions or updates, please contact [facility] Please consult [email or web address] for contact info under     Signed, Marcelino Duster, [place]  10/03/2019 4:37 PM   Agree with note by Jerene Dilling  We are asked to see Mr. [name] [name], patient of [name] admitted with congestive heart failure.  He has a complex history of CAD status post bare-metal stenting back in 2012 by Dr. Katrinka Blazing.  He has had ascending and descending thoracic aortic resection and grafting by Dr. Tyrone Sage remotely.  He was scheduled to have hernia repair this morning but he was admitted with congestive heart failure.  His BNP was elevated.  A 2D echo revealed significant decline in LV function with an EF in the 20 to 25% range with mild LV dilatation.  Apparently his last echo in 2015 showed normal LV function.  He denies chest pain or shortness of breath recently.  He is on diuretics at home.  He has PAF on amiodarone and Eliquis.  Eliquis was held for surgery.  He did have left carotid subclavian bypass as well.  He has diuresed with IV Lasix today and feels clinically improved.  We will start him on IV heparin, initiate aspirin therapy and plan on continuing IV Lasix.  We will arrange for him to undergo right left heart cath on Monday to determine etiology.  Runell Gess, M.D., FACP, [facility], [facility] [place] [address] [place], [place]  [address]  [phone] 10/03/2019 5:46 PM

## 2019-10-03 NOTE — Progress Notes (Signed)
First attempt to call report to [id number].

## 2019-10-03 NOTE — Progress Notes (Signed)
SOB somewhat better but not resolved. Off O2 sats were 88-91. I discussed with the anesthesia team. Recommend admission by the hospitalist service for further treatment. I spoke with Dr. Katrinka Blazing who will see him for admission. Appreciate his help.  Violeta Gelinas, MD, MPH, FACS Please use [email or web address] to contact on call provider

## 2019-10-03 NOTE — Interval H&P Note (Signed)
History and Physical Interval Note:  10/03/2019 8:30 AM  [name] [name]  has presented today for surgery, with the diagnosis of left inguinal hernia.  The various methods of treatment have been discussed with the patient and family. After consideration of risks, benefits and other options for treatment, the patient has consented to  Procedure(s): HERNIA REPAIR INGUINAL ADULT (Left) as a surgical intervention.  The patient's history has been reviewed, patient examined, no change in status, stable for surgery.  I have reviewed the patient's chart and labs.  Questions were answered to the patient's satisfaction.     Liz Malady

## 2019-10-03 NOTE — Progress Notes (Signed)
ANTICOAGULATION CONSULT NOTE - Initial Consult  Pharmacy Consult for enoxaparin Indication: atrial fibrillation  Patient Measurements: Height: 5\' 10"  (177.8 cm) Weight: 83.9 kg (185 lb) IBW/kg (Calculated) : 73  Vital Signs: Temp: 98.4 F (36.9 C) (04/16 1030) Temp Source: Oral (04/16 1030) BP: 123/61 (04/16 1030) Pulse Rate: 108 (04/16 1030)  Labs: Recent Labs    10/03/19 0741  LABPROT 15.4*  INR 1.2    Estimated Creatinine Clearance: 59.6 mL/min (by C-G formula based on SCr of 1.02 mg/dL).   Assessment: 79 yo M on apixaban PTA for afib/flutter (CHADS2VASc = 4), LD 4/14 admitted for repair of L inguinal hernia on 4/16. Pharmacy is consulted for peri-operative enoxaparin.   Patient is scheduled for OR 4/16 at 9:45. H/H anemic stable, plt stable. Good renal function. Will schedule enoxaparin for tomorrow given surgery planned today.   Goal of Therapy:  Monitor platelets by anticoagulation protocol: Yes   Plan:  Start enoxaparin 80mg  Q12hr on 4/17 F/u restart home apixaban Monitor CBC and signs/symptoms of bleeding    , PharmD, BCPS, BCCP Clinical Pharmacist  Please check AMION for all [facility] phone numbers After 10:00 PM, call Main Pharmacy [phone]

## 2019-10-03 NOTE — Progress Notes (Signed)
WILL TRANSPORT PT TO 6N 4C-1. AAOX4. PT IN NO APPARENT DISTRESS OR PAIN. CARDIAC MONITOR APPLIED FOR TRANSPORT. THE OPPORTUNITY TO ASK QUESTIONS WAS PROVIDED. FAMILY UPDATED.

## 2019-10-03 NOTE — H&P (Addendum)
History and Physical    [name] [name]:[id number] DOB: [birth year] DOA: 10/03/2019  Referring MD/NP/PA: Violeta Gelinas, MD PCP: Street, [name], MD  Patient coming from: Pre-op  Chief Complaint: Shortness of breath  I have personally briefly reviewed patient's old medical records in [facility] [facility]   HPI: AARUSH [name] is a 79 y.o. male with medical history significant of atrial flutter/atrial fibrillation on Eliquis, CHF, CAD, DVT, aortic valve repair, and aortic aneurysm s/p repair with stent and carotid subclavian bypass presented for left inguinal hernia repair.  However, after getting his portable chest x-ray he became acutely short of breath with wheezing. At baseline patient also reports that he is not on oxygen and has never had issues with wheezing before. He complained of feeling very sweaty and having left-sided chest pain.  Patient was noted to be hypoxic down to 88% on room air and was placed on 2 L nasal cannula oxygen with improvement in oxygenation.  Chest x-ray revealed signs concerning for interstitial edema.  Patient had fallen last month and suffered several right rib fractures.  His wife noted that he appeared more pale, but patient denied any bleeding except for immediately following the fall where he had one episode of blood in his urine.  He had stopped taking Eliquis 2 days ago in preparation for this surgery.  Review of records notes that patient's heart function was around 55% back in 2014.  Labs from 4/13 revealed hemoglobin 9.7, but appear to be near his baseline.  Due to the patient's symptoms surgery was canceled and [facility] was called to admit.  He had not taken his morning Lasix, but he normally takes 40 mg of Lasix twice daily.  Chest pain has since resolved.   ED Course: As seen above  Review of Systems  Constitutional: Negative for chills and fever.  HENT: Negative for ear discharge and sinus pain.   Eyes: Negative for double vision and pain.    Respiratory: Positive for shortness of breath and wheezing.   Cardiovascular: Positive for chest pain and leg swelling.  Gastrointestinal: Negative for nausea and vomiting.  Genitourinary: Negative for dysuria and hematuria.  Musculoskeletal: Positive for myalgias. Negative for falls.  Skin: Negative for itching.  Neurological: Negative for focal weakness and loss of consciousness.  Psychiatric/Behavioral: Negative for memory loss and substance abuse.    Past Medical History:  Diagnosis Date  . Aortic valve insufficiency   . Arthritis    "probably in my knees before they replaced them" (01/28/2015)  . Atrial flutter (HCC)   . CAD (coronary artery disease)   . CHF (congestive heart failure) (HCC)   . Descending aortic aneurysm (HCC)   . DVT (deep venous thrombosis) (HCC)    ?LLE post knee surgery  . History of blood transfusion    "after one of my knee surgeries"  . HTN (hypertension)   . Hyperlipidemia   . Thoracic aneurysm   . Tobacco abuse     Past Surgical History:  Procedure Laterality Date  . AORTIC VALVE REPLACEMENT  02/19/2009  . AORTO-FEMORAL BYPASS GRAFT  03/2010   ascending aortic and arch aneurysm repair/notes 04/09/2009  . CARDIAC VALVE REPLACEMENT    . CAROTID-SUBCLAVIAN BYPASS GRAFT Left 08/26/2013   Procedure: BYPASS GRAFT CAROTID-SUBCLAVIAN;  Surgeon: Nada Libman, MD;  Location: [facility] OR;  Service: Vascular;  Laterality: Left;  . CATARACT EXTRACTION W/ INTRAOCULAR LENS  IMPLANT, BILATERAL Bilateral   . ENDOVASCULAR STENT INSERTION N/A 08/26/2013   Procedure:  THORACIC STENT GRAFT INSERTION;  Surgeon: Nada Libman, MD;  Location: [facility] OR;  Service: Vascular;  Laterality: N/A;  . EYE SURGERY    . JOINT REPLACEMENT    . KNEE ARTHROSCOPY Bilateral   . REPLACEMENT TOTAL KNEE Bilateral   . right groin lymphocele Right 03/29/2009  . THORACENTESIS  2010 X 2  . TONSILLECTOMY       reports that he quit smoking about 45 years ago. His smoking use included  cigarettes. He smoked 0.00 packs per day. He has never used smokeless tobacco. He reports that he does not drink alcohol or use drugs.  Allergies  Allergen Reactions  . Lortab [Hydrocodone-Acetaminophen] Hives    No trouble breathing  . Morphine And Related Hives  . Other Other (See Comments)    Staples from surgery caused infection    Family History  Problem Relation Age of Onset  . Celiac disease Daughter   . Aortic aneurysm Father   . Hypertension Mother   . Heart attack Neg Hx   . Stroke Neg Hx     Prior to Admission medications   Medication Sig Start Date End Date Taking? Authorizing Provider  acetaminophen (TYLENOL) 500 MG tablet Take 2 tablets (1,000 mg total) by mouth every 6 (six) hours as needed. 09/08/19  Yes Barnetta Chapel, PA-C  amiodarone (PACERONE) 200 MG tablet Take 1 tablet (200 mg total) by mouth daily. 05/29/19  Yes [name], MD  apixaban (ELIQUIS) 5 MG TABS tablet Take 1 tablet (5 mg total) by mouth 2 (two) times daily. 05/29/19  Yes [name], MD  atorvastatin (LIPITOR) 10 MG tablet Take 1 tablet by mouth once daily Patient taking differently: Take 10 mg by mouth every morning.  06/16/19  Yes [name], MD  furosemide (LASIX) 40 MG tablet Take 1 tablet by mouth twice daily Patient taking differently: Take 40 mg by mouth 2 (two) times daily.  06/02/19  Yes [name], MD  lansoprazole (PREVACID) 30 MG capsule Take 30 mg by mouth daily. 08/13/19  Yes [provider]  loratadine (CLARITIN) 10 MG tablet Take 1 tablet (10 mg total) by mouth daily. 06/01/14  Yes [name], MD  methocarbamol (ROBAXIN) 500 MG tablet Take 2 tablets (1,000 mg total) by mouth every 8 (eight) hours as needed for muscle spasms. Patient taking differently: Take 500 mg by mouth every 8 (eight) hours as needed for muscle spasms.  09/08/19  Yes Barnetta Chapel, PA-C  metoprolol tartrate (LOPRESSOR) 25 MG tablet Take 1 tablet (25 mg  total) by mouth 2 (two) times daily. Patient taking differently: Take 12.5 mg by mouth at bedtime.  05/29/19  Yes [name], MD  Multiple Vitamin (MULTIVITAMIN WITH MINERALS) TABS tablet Take 1 tablet by mouth daily.   Yes [provider]  oxyCODONE (OXY IR/ROXICODONE) 5 MG immediate release tablet Take 1-2 tablets (5-10 mg total) by mouth every 6 (six) hours as needed for moderate pain. 09/08/19  Yes Barnetta Chapel, PA-C  potassium chloride SA (KLOR-CON) 20 MEQ tablet Take 2 tablets by mouth once daily Patient taking differently: Take 40 mEq by mouth daily.  06/16/19  Yes [name], MD  nitroGLYCERIN (NITROSTAT) 0.4 MG SL tablet Place 1 tablet (0.4 mg total) under the tongue every 5 (five) minutes as needed for chest pain (X3 DOSES BEFORE CALLING 911). 05/29/19   [name], MD    Physical Exam:  Constitutional: Elderly man who appears to be in no  acute distress at this time Vitals:   10/03/19 0914 10/03/19 0915 10/03/19 0920 10/03/19 0925  BP:  (!) 172/87 (!) 180/75 (!) 160/63  Pulse: (!) 104 (!) 102 (!) 106 (!) 105  Resp: 17 (!) 25 19 (!) 30  Temp:      TempSrc:      SpO2: 91% (!) 89% 94% 97%  Weight:      Height:       Eyes: PERRL, lids and conjunctivae normal ENMT: Mucous membranes are moist. Posterior pharynx clear of any exudate or lesions.Normal dentition.  Neck: normal, supple, no masses, no thyromegaly.  JVD present. Respiratory: Patient currently on 2 L nasal cannula oxygen with O2 saturations maintained, but can appreciate fine crackles in the mid to lower lung fields.  No wheezing appreciated at this time. Cardiovascular: Irregular irregular rhythm.  1+ pitting edema of the lower extremities. Abdomen: no tenderness, no masses palpated. No hepatosplenomegaly. Bowel sounds positive.  Musculoskeletal: no clubbing / cyanosis. No joint deformity upper and lower extremities. Good ROM, no contractures. Normal muscle tone.  Skin: no rashes,  lesions, ulcers. No induration Neurologic: CN 2-12 grossly intact. Sensation intact, DTR normal. Strength 5/5 in all 4.  Psychiatric: Normal judgment and insight. Alert and oriented x 3. Normal mood.     Labs on Admission: I have personally reviewed following labs and imaging studies  CBC: Recent Labs  Lab 09/30/19 1048  WBC 6.5  HGB 9.7*  HCT 34.6*  MCV 93.5  PLT 884   Basic Metabolic Panel: Recent Labs  Lab 09/30/19 1048  NA 143  K 4.6  CL 105  CO2 26  GLUCOSE 105*  BUN 19  CREATININE 1.02  CALCIUM 9.2   GFR: Estimated Creatinine Clearance: 59.6 mL/min (by C-G formula based on SCr of 1.02 mg/dL). Liver Function Tests: No results for input(s): AST, ALT, ALKPHOS, BILITOT, PROT, ALBUMIN in the last 168 hours. No results for input(s): LIPASE, AMYLASE in the last 168 hours. No results for input(s): AMMONIA in the last 168 hours. Coagulation Profile: Recent Labs  Lab 10/03/19 0741  INR 1.2   Cardiac Enzymes: No results for input(s): CKTOTAL, CKMB, CKMBINDEX, TROPONINI in the last 168 hours. BNP (last 3 results) No results for input(s): PROBNP in the last 8760 hours. HbA1C: No results for input(s): HGBA1C in the last 72 hours. CBG: No results for input(s): GLUCAP in the last 168 hours. Lipid Profile: No results for input(s): CHOL, HDL, LDLCALC, TRIG, CHOLHDL, LDLDIRECT in the last 72 hours. Thyroid Function Tests: No results for input(s): TSH, T4TOTAL, FREET4, T3FREE, THYROIDAB in the last 72 hours. Anemia Panel: No results for input(s): VITAMINB12, FOLATE, FERRITIN, TIBC, IRON, RETICCTPCT in the last 72 hours. Urine analysis:    Component Value Date/Time   COLORURINE STRAW (A) 09/07/2019 1813   APPEARANCEUR CLEAR 09/07/2019 1813   LABSPEC 1.010 09/07/2019 1813   PHURINE 6.0 09/07/2019 1813   GLUCOSEU NEGATIVE 09/07/2019 1813   HGBUR LARGE (A) 09/07/2019 1813   BILIRUBINUR NEGATIVE 09/07/2019 1813   KETONESUR NEGATIVE 09/07/2019 1813   PROTEINUR NEGATIVE  09/07/2019 1813   UROBILINOGEN 1.0 10/06/2013 0000   NITRITE NEGATIVE 09/07/2019 1813   LEUKOCYTESUR NEGATIVE 09/07/2019 1813   Sepsis Labs: Recent Results (from the past 240 hour(s))  SARS CORONAVIRUS 2 (TAT 6-24 HRS) Nasopharyngeal Nasopharyngeal Swab     Status: None   Collection Time: 09/30/19  9:07 AM   Specimen: Nasopharyngeal Swab  Result Value Ref Range Status   SARS Coronavirus 2 NEGATIVE NEGATIVE Final  Comment: (NOTE) SARS-CoV-2 target nucleic acids are NOT DETECTED. The SARS-CoV-2 RNA is generally detectable in upper and lower respiratory specimens during the acute phase of infection. Negative results do not preclude SARS-CoV-2 infection, do not rule out co-infections with other pathogens, and should not be used as the sole basis for treatment or other patient management decisions. Negative results must be combined with clinical observations, patient history, and epidemiological information. The expected result is Negative. Fact Sheet for Patients: HairSlick.no Fact Sheet for Healthcare Providers: quierodirigir.com This test is not yet approved or cleared by the [place] FDA and  has been authorized for detection and/or diagnosis of SARS-CoV-2 by FDA under an Emergency Use Authorization (EUA). This EUA will remain  in effect (meaning this test can be used) for the duration of the COVID-19 declaration under Section 56 4(b)(1) of the Act, 21 U.S.C. section 360bbb-3(b)(1), unless the authorization is terminated or revoked sooner. Performed at [facility], [address], [place], [place] [address]      Radiological Exams on Admission: DG CHEST PORT 1 VIEW  Result Date: 10/03/2019 CLINICAL DATA:  Preop exam. Additional history provided: Preop exam for left hernia repair, labored breathing, known right broken ribs. EXAM: PORTABLE CHEST 1 VIEW COMPARISON:  Prior chest radiograph 09/08/2019 and earlier  FINDINGS: Unchanged cardiomegaly. Prior median sternotomy. Redemonstrated aortic valve replacement and aortic stent graft. Pulmonary vascular congestion with interstitial prominence. No definite right pleural effusion. A small left pleural effusion is difficult to exclude. No evidence of pneumothorax. Known recent right-sided rib fractures were better appreciated on prior chest CT 09/04/2019. IMPRESSION: Unchanged cardiomegaly with aortic valve replacement and aortic stent graft. Pulmonary vascular congestion. Interstitial prominence has progressed as compared to prior exam 09/08/2019 and likely reflects interstitial edema. Atypical/viral pneumonia cannot be excluded. Possible trace left pleural effusion. Electronically Signed   By: Jackey Loge DO   On: 10/03/2019 09:09    EKG: Independently reviewed.  Atrial fibrillation with RVR 115 bpm  Assessment/Plan Acute respiratory distress secondary to systolic congestive heart failure exacerbation: Presented with acute shortness of breath with wheezing.  O2 saturations noted to be 88% on room air.  Chest x-ray showing concern for interstitial edema.  On physical exam patient with lower extremity swelling and signs of JVD.  BNP ordered stat returned to 1334.6.  Patient had been ordered 40 mg of Lasix IV.  He had missed his home Lasix dose this morning prior to the procedure.  Patient reports being followed by [name] of cardiology.  Prior echocardiogram from 2014 revealed EF of 55% -Admit to a telemetry bed -Heart failure order set utilized -Continuous pulse oximetry with nasal cannula oxygen to maintain O2 saturations -Strict intake and output -Daily weights  -Check TSH -Lasix 40 mg IV twice daily -Check echocardiogram -Check CT angiogram of the chest  -Echocardiogram revealed EF of 25 to 30% with global hypokinesis.  Cardiology was formally consulted, follow-up further recommendations.   Chest pain: Patient reported having left-sided chest pain  that is now resolved.  Patient with previous history of coronary artery disease and MI. -Check cardiac troponin  Paroxysmal atrial fibrillation on chronic anticoagulation: Patient noted to be in atrial fibrillation which appears more so chronic heart rates initially elevated up to 115.  Eliquis had been on hold since Wednesday for surgery plan today.  Home medications include amiodarone 200 mg which patient took today. -Lovenox per pharmacy -Goal potassium 4 and magnesium 2 -Consider restarting Eliquis when medically appropriate  Essential hypertension:  Home blood pressure medications include metoprolol 12.5 mg nightly. -Continue metoprolol  Inguinal hernia: Patient had been scheduled for surgery with Dr. [name], but was held due to shortness of breath and tachycardia. -Follow-up with general surgery  Hypochromic anemia: Hemoglobin 9.7 which appears near his baseline with low MCH.  Does not report any recent episodes of bleeding. -Continue to monitor H&H    -Check iron studies in a.m.   History of aortic aneurysms, status post aortic valve replacement: Patient status post repair for recurrent aortic aneurysms.    Right rib fractures after fall: Patient fell last month and sustained injury.  Imaging studies reveal routine healing. -Incentive spirometry  DVT prophylaxis: Treatment dose of Lovenox Code Status: Full Family Communication: Discussed plan of care with patient's family present at bedside Disposition Plan: Likely discharge home once medically stable Consults called: Surgery Admission status: Inpatient  Clydie Braun MD [facility] Pager [phone]   If 7PM-7AM, please contact night-coverage [email or web address] Password [facility]  10/03/2019, 9:37 AM

## 2019-10-03 NOTE — Plan of Care (Signed)
  Problem: Education: Goal: Knowledge of General Education information will improve Description: Including pain rating scale, medication(s)/side effects and non-pharmacologic comfort measures Outcome: Progressing   Problem: Health Behavior/Discharge Planning: Goal: Ability to manage health-related needs will improve Outcome: Progressing   Problem: Clinical Measurements: Goal: Ability to maintain clinical measurements within normal limits will improve Outcome: Progressing Goal: Respiratory complications will improve Outcome: Progressing Goal: Cardiovascular complication will be avoided Outcome: Progressing   Problem: Activity: Goal: Risk for activity intolerance will decrease Outcome: Progressing   Problem: Nutrition: Goal: Adequate nutrition will be maintained Outcome: Progressing   Problem: Pain Managment: Goal: General experience of comfort will improve Outcome: Progressing   Problem: Safety: Goal: Ability to remain free from injury will improve Outcome: Progressing   Problem: Skin Integrity: Goal: Risk for impaired skin integrity will decrease Outcome: Progressing   

## 2019-10-03 NOTE — Progress Notes (Signed)
Immediately following a portable CXR, the pt became extremely SOB with wheezing. Pt also c/o Left-sided chest pain. O2 at 2L via n/c applied. Dr. Noreene Larsson from anesthesia at the bedside. Verbal orders received. Stat EKG performed. Per Dr. Noreene Larsson, Dr. [name] will be notified.

## 2019-10-04 DIAGNOSIS — J9621 Acute and chronic respiratory failure with hypoxia: Secondary | ICD-10-CM

## 2019-10-04 LAB — BASIC METABOLIC PANEL
Anion gap: 9 (ref 5–15)
BUN: 20 mg/dL (ref 8–23)
CO2: 31 mmol/L (ref 22–32)
Calcium: 8.7 mg/dL — ABNORMAL LOW (ref 8.9–10.3)
Chloride: 102 mmol/L (ref 98–111)
Creatinine, Ser: 1.07 mg/dL (ref 0.61–1.24)
GFR calc Af Amer: >60 mL/min (ref >60)
GFR calc non Af Amer: >60 mL/min (ref >60)
Glucose, Bld: 109 mg/dL — ABNORMAL HIGH (ref 70–99)
Potassium: 3.9 mmol/L (ref 3.5–5.1)
Sodium: 142 mmol/L (ref 135–145)

## 2019-10-04 LAB — FERRITIN: Ferritin: 30 ng/mL (ref 24–336)

## 2019-10-04 LAB — MAGNESIUM: Magnesium: 2.2 mg/dL (ref 1.7–2.4)

## 2019-10-04 LAB — APTT
aPTT: 73 seconds — ABNORMAL HIGH (ref 24–36)
aPTT: 67 seconds — ABNORMAL HIGH (ref 24–36)

## 2019-10-04 LAB — TSH: TSH: 1.342 u[IU]/mL (ref 0.350–4.500)

## 2019-10-04 LAB — IRON AND TIBC
Iron: 28 ug/dL — ABNORMAL LOW (ref 45–182)
Saturation Ratios: 7 % — ABNORMAL LOW (ref 17.9–39.5)
TIBC: 386 ug/dL (ref 250–450)
UIBC: 358 ug/dL

## 2019-10-04 LAB — HEPARIN LEVEL (UNFRACTIONATED): Heparin Unfractionated: 1.02 IU/mL — ABNORMAL HIGH (ref 0.30–0.70)

## 2019-10-04 MED ORDER — METOPROLOL TARTRATE 25 MG PO TABS
12.5000 mg | ORAL_TABLET | Freq: Two times a day (BID) | ORAL | Status: DC
  Administered 2019-10-04 – 2019-10-05 (×3): 12.5 mg via ORAL
  Filled 2019-10-04 (×2): qty 1

## 2019-10-04 MED ORDER — POLYETHYLENE GLYCOL 3350 17 G PO PACK
17.0000 g | PACK | Freq: Every day | ORAL | Status: DC
  Administered 2019-10-04 – 2019-10-07 (×3): 17 g via ORAL
  Filled 2019-10-04 (×3): qty 1

## 2019-10-04 MED ORDER — FERROUS SULFATE 325 (65 FE) MG PO TABS
325.0000 mg | ORAL_TABLET | Freq: Every day | ORAL | Status: DC
  Administered 2019-10-04 – 2019-10-07 (×4): 325 mg via ORAL
  Filled 2019-10-04 (×4): qty 1

## 2019-10-04 NOTE — Progress Notes (Signed)
Progress Note  Patient Name: [name] [name] Date of Encounter: 10/04/2019  Primary Cardiologist:   Larae Grooms, MD   Subjective   Denies chest pain or SOB.  EKG done because monitor suggested ST elevation.  However, there was ST depression not significantly changed from previous.    Inpatient Medications    Scheduled Meds: . amiodarone  200 mg Oral Daily  . aspirin EC  81 mg Oral Daily  . atorvastatin  10 mg Oral q morning - 10a  . furosemide  40 mg Intravenous BID  . loratadine  10 mg Oral Daily  . metoprolol tartrate  12.5 mg Oral BID  . pantoprazole  40 mg Oral Daily  . potassium chloride SA  40 mEq Oral Daily  . sodium chloride flush  3 mL Intravenous Q12H   Continuous Infusions: . sodium chloride    . heparin 950 Units/hr (10/04/19 0600)   PRN Meds: sodium chloride, acetaminophen, methocarbamol, ondansetron (ZOFRAN) IV, oxyCODONE, sodium chloride flush   Vital Signs    Vitals:   10/03/19 1406 10/03/19 2103 10/04/19 0600 10/04/19 1316  BP: 110/64 120/67 129/69 (!) 115/57  Pulse: (!) 105 (!) 109 (!) 103 (!) 108  Resp: 16 18 20 16   Temp: 98.4 F (36.9 C) 98 F (36.7 C) 97.9 F (36.6 C) 98.2 F (36.8 C)  TempSrc: Oral Oral Oral Oral  SpO2: 100% 94% 97% 100%  Weight:      Height:        Intake/Output Summary (Last 24 hours) at 10/04/2019 1337 Last data filed at 10/04/2019 0600 Gross per 24 hour  Intake 532.82 ml  Output 150 ml  Net 382.82 ml   Filed Weights   10/03/19 0733  Weight: 83.9 kg    Telemetry    NSR with ectopy - Personally Reviewed  ECG    Sinus tach.  Unable to print and they cannot download.  I saw it briefly and there diffuse ST depression in the inferior and lateral leads.   - Personally Reviewed  Physical Exam   GEN: No acute distress.   Neck: No  JVD Cardiac: RRR, no murmurs, rubs, or gallops.  Respiratory:   Decreased breath sounds.  No crackles.  GI: Soft, nontender, non-distended  MS: No edema; No  deformity. Neuro:  Nonfocal  Psych: Normal affect   Labs    Chemistry Recent Labs  Lab 09/30/19 1048 10/03/19 1617 10/04/19 0309  NA 143 142 142  K 4.6 4.3 3.9  CL 105 102 102  CO2 26 32 31  GLUCOSE 105* 115* 109*  BUN 19 19 20   CREATININE 1.02 1.04 1.07  CALCIUM 9.2 8.8* 8.7*  GFRNONAA >60 >60 >60  GFRAA >60 >60 >60  ANIONGAP 12 8 9      Hematology Recent Labs  Lab 09/30/19 1048 10/03/19 1617  WBC 6.5 6.4  RBC 3.70* 3.65*  HGB 9.7* 9.3*  HCT 34.6* 33.5*  MCV 93.5 91.8  MCH 26.2 25.5*  MCHC 28.0* 27.8*  RDW 16.8* 17.0*  PLT 239 232    Cardiac EnzymesNo results for input(s): TROPONINI in the last 168 hours. No results for input(s): TROPIPOC in the last 168 hours.   BNP Recent Labs  Lab 10/03/19 1048  BNP 1,334.6*     DDimer No results for input(s): DDIMER in the last 168 hours.   Radiology    CT ANGIO CHEST PE W OR WO CONTRAST  Result Date: 10/03/2019 CLINICAL DATA:  Shortness of breath. EXAM: CT ANGIOGRAPHY CHEST WITH  CONTRAST TECHNIQUE: Multidetector CT imaging of the chest was performed using the standard protocol during bolus administration of intravenous contrast. Multiplanar CT image reconstructions and MIPs were obtained to evaluate the vascular anatomy. CONTRAST:  93mL OMNIPAQUE IOHEXOL 350 MG/ML SOLN COMPARISON:  09/04/2019. FINDINGS: Cardiovascular: The heart remains enlarged with dense coronary artery calcifications. An aortic stent is unchanged. The distal aortic arch and descending thoracic aorta remains diffusely enlarged. The distal arch measures 5.0 cm in maximum diameter without significant change since 09/04/2019. The lower lung zone pulmonary arteries are poorly opacified with poor mixing of opacified and unopacified blood. No definite pulmonary arterial filling defects are confirmed in 3 planes. The remainder of the pulmonary arteries are normally opacified with no pulmonary arterial filling defects seen. Mediastinum/Nodes: No enlarged  mediastinal, hilar, or axillary lymph nodes. Thyroid gland, trachea, and esophagus demonstrate no significant findings. Lungs/Pleura: Interval small bilateral pleural effusions. Minimal patchy ground-glass opacity is again demonstrated in both lungs, remaining greater on the right. Mild bilateral dependent atelectasis. Interval small oval area of patchy density in the periphery of the right upper lobe measuring 9.6 mm on image number 54 series 6. A previously demonstrated 9 mm solid right upper lobe nodule on image number 56 series 6 is unchanged. This not changed significantly since 02/18/2009. No new nodules are seen. Upper Abdomen: A heterogeneous mass arising from the upper pole of the right kidney has not changed significantly, measuring 2.7 cm in maximum diameter on image number 147 series 5. This was previously stable over multiple examinations other than new soft tissue density. Musculoskeletal: Multiple mildly displaced right posterior rib fractures are again demonstrated. Some of these are healing and others demonstrate no significant callus formation. Review of the MIP images confirms the above findings. IMPRESSION: 1. The lower lung zone pulmonary arteries are poorly opacified with poor mixing of opacified and unopacified blood. No definite pulmonary emboli are seen. 2. Interval small bilateral pleural effusions. 3. Interval small oval area of patchy density in the periphery of the right upper lobe. This could represent a small area of infection or inflammation. 4. Stable 9 mm solid right upper lobe nodule since 02/18/2009. The long-term stability is compatible with a benign process and this does not need follow-up. 5. Stable cardiomegaly and dense coronary artery atherosclerosis. 6. 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 7. Multiple mildly displaced right posterior rib fractures, some of which are healing and others demonstrate no significant callus  formation. 8. Stable 2.7 cm heterogeneous mass arising from the upper pole of the right kidney, possibly representing a hemorrhagic cyst as previously discussed. The current appearance can also be seen with malignancy. This could be further evaluated with pre and postcontrast magnetic resonance imaging of the kidneys on an outpatient basis when the patient is able to follow breathing instructions Electronically Signed   By: Beckie Salts M.D.   On: 10/03/2019 15:23   DG CHEST PORT 1 VIEW  Result Date: 10/03/2019 CLINICAL DATA:  Preop exam. Additional history provided: Preop exam for left hernia repair, labored breathing, known right broken ribs. EXAM: PORTABLE CHEST 1 VIEW COMPARISON:  Prior chest radiograph 09/08/2019 and earlier FINDINGS: Unchanged cardiomegaly. Prior median sternotomy. Redemonstrated aortic valve replacement and aortic stent graft. Pulmonary vascular congestion with interstitial prominence. No definite right pleural effusion. A small left pleural effusion is difficult to exclude. No evidence of pneumothorax. Known recent right-sided rib fractures were better appreciated on prior chest CT 09/04/2019. IMPRESSION: Unchanged cardiomegaly with  aortic valve replacement and aortic stent graft. Pulmonary vascular congestion. Interstitial prominence has progressed as compared to prior exam 09/08/2019 and likely reflects interstitial edema. Atypical/viral pneumonia cannot be excluded. Possible trace left pleural effusion. Electronically Signed   By: Jackey Loge DO   On: 10/03/2019 09:09   ECHOCARDIOGRAM COMPLETE  Result Date: 10/03/2019    ECHOCARDIOGRAM REPORT   Patient Name:   [name] [name] Date of Exam: 10/03/2019 Medical Rec #:  [id number]         Height:       70.0 in Accession #:    [id number]        Weight:       185.0 lb Date of Birth:  April 29, 1941         BSA:          2.019 m Patient Age:    79 years          BP:           121/66 mmHg Patient Gender: M                 HR:           98  bpm. Exam Location:  Inpatient Procedure: 2D Echo, Cardiac Doppler and Color Doppler Indications:    R07.9* Chest pain, unspecified  History:        Patient has prior history of Echocardiogram examinations, most                 recent 08/23/2013. CHF, CAD, Aortic Valve Disease,                 Arrythmias:Atrial Flutter; Risk Factors:Former Smoker,                 Hypertension and Dyslipidemia. DVT. Thoracic Aortic Aneurysm.                 Aortic Valve: unknown a bioprosthetic valve is present in the                 aortic position.  Sonographer:    Tiffany Dance Referring Phys: [id number] RONDELL A SMITH IMPRESSIONS  1. Diffuse hypokinesis worse in the septum, apex and akinesis of inferior base . Left ventricular ejection fraction, by estimation, is 25 to 30%. The left ventricle has severely decreased function. The left ventricle demonstrates global hypokinesis. The  left ventricular internal cavity size was mildly dilated. Left ventricular diastolic parameters are indeterminate.  2. Right ventricular systolic function is normal. The right ventricular size is normal. There is normal pulmonary artery systolic pressure.  3. Left atrial size was moderately dilated.  4. Right atrial size was moderately dilated.  5. The mitral valve is degenerative. Mild mitral valve regurgitation.  6. By history bioprosthetic AVR with trivial central AR and acceptable mean gradient of 7 mmHg . The aortic valve has been repaired/replaced. Aortic valve regurgitation is trivial. No aortic stenosis is present. There is a unknown a bioprosthetic valve present in the aortic position.  7. ? dilated descending thoracic aorta seen on PsL views. FINDINGS  Left Ventricle: Diffuse hypokinesis worse in the septum, apex and akinesis of inferior base. Left ventricular ejection fraction, by estimation, is 25 to 30%. The left ventricle has severely decreased function. The left ventricle demonstrates global hypokinesis. The left ventricular internal cavity  size was mildly dilated. There is no left ventricular hypertrophy. Left ventricular diastolic parameters are indeterminate. Right Ventricle: The right ventricular size is normal. Right vetricular wall thickness  was not assessed. Right ventricular systolic function is normal. There is normal pulmonary artery systolic pressure. The tricuspid regurgitant velocity is 2.32 m/s, and with an assumed right atrial pressure of 3 mmHg, the estimated right ventricular systolic pressure is 24.5 mmHg. Left Atrium: Left atrial size was moderately dilated. Right Atrium: Right atrial size was moderately dilated. Pericardium: There is no evidence of pericardial effusion. Mitral Valve: The mitral valve is degenerative in appearance. There is mild thickening of the mitral valve leaflet(s). There is mild calcification of the mitral valve leaflet(s). Moderate mitral annular calcification. Mild mitral valve regurgitation. Tricuspid Valve: The tricuspid valve is normal in structure. Tricuspid valve regurgitation is mild. Aortic Valve: By history bioprosthetic AVR with trivial central AR and acceptable mean gradient of 7 mmHg. The aortic valve has been repaired/replaced. Aortic valve regurgitation is trivial. No aortic stenosis is present. Aortic valve mean gradient measures 6.7 mmHg. Aortic valve peak gradient measures 13.7 mmHg. Aortic valve area, by VTI measures 2.05 cm. There is a unknown a bioprosthetic valve present in the aortic position. Pulmonic Valve: The pulmonic valve was normal in structure. Pulmonic valve regurgitation is mild. Aorta: ? dilated descending thoracic aorta seen on PsL views. The aortic root is normal in size and structure. IAS/Shunts: The interatrial septum was not well visualized.  LEFT VENTRICLE PLAX 2D LVIDd:         5.60 cm LVIDs:         4.30 cm LV PW:         1.40 cm LV IVS:        1.10 cm LVOT diam:     2.30 cm LV SV:         72 LV SV Index:   36 LVOT Area:     4.15 cm  RIGHT VENTRICLE             IVC RV  Basal diam:  3.80 cm     IVC diam: 2.90 cm RV Mid diam:    1.90 cm RV S prime:     11.30 cm/s TAPSE (M-mode): 2.2 cm LEFT ATRIUM              Index       RIGHT ATRIUM           Index LA diam:        4.70 cm  2.33 cm/m  RA Area:     28.70 cm LA Vol (A2C):   150.0 ml 74.28 ml/m RA Volume:   93.30 ml  46.20 ml/m LA Vol (A4C):   82.7 ml  40.95 ml/m LA Biplane Vol: 120.0 ml 59.43 ml/m  AORTIC VALVE AV Area (Vmax):    2.53 cm AV Area (Vmean):   2.52 cm AV Area (VTI):     2.05 cm AV Vmax:           185.33 cm/s AV Vmean:          113.600 cm/s AV VTI:            0.353 m AV Peak Grad:      13.7 mmHg AV Mean Grad:      6.7 mmHg LVOT Vmax:         113.00 cm/s LVOT Vmean:        69.000 cm/s LVOT VTI:          0.174 m LVOT/AV VTI ratio: 0.49  AORTA Ao Root diam: 3.90 cm Ao Asc diam:  3.20 cm MITRAL VALVE  TRICUSPID VALVE MV Area (PHT): 3.69 cm    TR Peak grad:   21.5 mmHg MV Decel Time: 206 msec    TR Vmax:        232.00 cm/s MV E velocity: 90.65 cm/s                            SHUNTS                            Systemic VTI:  0.17 m                            Systemic Diam: 2.30 cm Charlton Haws MD Electronically signed by Charlton Haws MD Signature Date/Time: 10/03/2019/3:33:27 PM    Final     Cardiac Studies   ECHO:     1. Diffuse hypokinesis worse in the septum, apex and akinesis of inferior base . Left ventricular ejection fraction, by estimation, is 25 to 30%. The left ventricle has severely decreased function. The left ventricle demonstrates global hypokinesis. The left ventricular internal cavity size was mildly dilated. Left ventricular diastolic parameters are indeterminate. 2. Right ventricular systolic function is normal. The right ventricular size is normal. There is normal pulmonary artery systolic pressure. 3. Left atrial size was moderately dilated. 4. Right atrial size was moderately dilated. 5. The mitral valve is degenerative. Mild mitral valve regurgitation. 6. By history  bioprosthetic AVR with trivial central AR and acceptable mean gradient of 7 mmHg . The aortic valve has been repaired/replaced. Aortic valve regurgitation is trivial. No aortic stenosis is present. There is a unknown a bioprosthetic valve present in the aortic position. 7.  dilated descending thoracic aorta seen on PsL views.   Patient Profile     79 y.o. 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  Assessment & Plan     New onset systolic heart failure:  Intake and output incomplete.  Right and left heart cath on Monday.  On board for cath.     Paroxysmal atrial fibrillation:  Transitioned to heparin.   Hx of AVR:  Stable on echo.      For questions or updates, please contact [facility] Please consult [email or web address] for contact info under Cardiology/STEMI.   Signed, Rollene Rotunda, MD  10/04/2019, 1:37 PM

## 2019-10-04 NOTE — Progress Notes (Signed)
Central telemetry reported that ST elevation alarm has been alarming . Patient alert and oriented x 4 and has no new complaints. BP wnl, HR has been slightly tachy today, 96-98 % on 0.5L , [place], no SOB or chest heaviness. MD notified. Will do 12 lead EKG as ordered.

## 2019-10-04 NOTE — Progress Notes (Signed)
PROGRESS NOTE  [name] [name] IWP:[id number] DOB: [birth year] DOA: 10/03/2019 PCP: [name] [name], [name] Coup, MD  HPI/Recap of past 24 hours: HPI from [name] [name] is a 79 y.o. male with medical history significant of atrial flutter/atrial fibrillation on Eliquis, CHF, CAD, DVT, aortic valve repair, and aortic aneurysm s/p repair with stent and carotid subclavian bypass presented for left inguinal hernia repair.  However, after getting his portable chest x-ray he became acutely short of breath with wheezing. At baseline patient also reports that he is not on oxygen and has never had issues with wheezing before. He complained of feeling very sweaty and having left-sided chest pain.  Patient was noted to be hypoxic down to 88% on room air and was placed on 2 L nasal cannula oxygen with improvement in oxygenation.  Chest x-ray revealed signs concerning for interstitial edema.  Patient had fallen last month and suffered several right rib fractures. Pt had stopped taking Eliquis 2 days ago in preparation for this surgery.  Review of records notes that patient's EF was around 55% back in 2014.  Labs from 4/13 revealed hemoglobin 9.7, but appear to be near his baseline.  Due to the patient's symptoms surgery was canceled and [facility] was called to admit.    Today, patient appeared very fatigued/weak, lethargic, denied any chest pain, worsening shortness of breath, abdominal pain, scrotal pain, nausea/vomiting, fever/chills, diaphoresis.    Assessment/Plan: Principal Problem:   Acute on chronic respiratory failure with hypoxia (HCC) Active Problems:   HTN (hypertension)   Hypochromic anemia   S/P AVR (aortic valve replacement)   Atrial fibrillation (HCC)   Chest pain   Left inguinal hernia   Acute hypoxic respiratory failure likely 2/2 new onset systolic HF BNP showed 1334, elevated from previous Troponins with flat trend, EKG with no acute ST changes Chest x-ray with pulmonary vascular  congestion Echo done on 10/03/2019 showed EF of 25 to 30%, diffuse hypokinesis, bioprosthetic AVR, EF more reduced than previous of 55% Cardiology consulted, continue Lasix, IV heparin, plan for LHC on 10/06/2019 Strict I's and O's, daily weights  Chest pain/history of CAD/history of aortic aneurysm/status post aortic valve replacement Currently chest pain-free Troponin, EKG as above Echo as above Cardiology on board, plan for LHC on 10/06/2019  Paroxysmal A. Fib Heart rate about 110s Continue amiodarone, metoprolol, IV heparin for now Hold home Eliquis for now  Inguinal hernia Denies any abdominal/scrotal pain Surgery with [name] [name] postponed due to new onset HF Follow-up with general surgery  Hypertension Continue metoprolol  Iron deficiency anemia/normocytic anemia Hemoglobin 9.3, around baseline Anemia panel showed iron 28, sat 7, TIBC 386, ferritin 30 Start oral iron supplementation, will give him 1 dose of Feraheme prior to discharge once more stable Daily CBC  Right rib fractures after fall Imaging studies shows healing Incentive spirometry PT/OT once stable  Mass in right kidney Stable 2.7 cm heterogeneous mass arising from the upper pole of the right kidney possibly representing a hemorrhagic cyst, can also be seen with malignancy.  MRI kidney on outpatient basis may be done to rule out malignancy, ??given his anemia        Malnutrition Type:      Malnutrition Characteristics:      Nutrition Interventions:       Estimated body mass index is 26.54 kg/m as calculated from the following:   Height as of this encounter: 5\' 10"  (1.778 m).   Weight as of this encounter: 83.9 kg.  Code Status: Full  Family Communication: Discussed extensively with wife at bedside on 10/04/2019  Disposition Plan: Patient came from home, still requires further work-up procedure and consultants sign off, pending PT/OT.    Consultants:  General  surgery  Cardiology  Procedures:  None  Antimicrobials:  None  DVT prophylaxis: Heparin drip    Objective: Vitals:   10/03/19 1406 10/03/19 2103 10/04/19 0600 10/04/19 1316  BP: 110/64 120/67 129/69 (!) 115/57  Pulse: (!) 105 (!) 109 (!) 103 (!) 108  Resp: 16 18 20 16   Temp: 98.4 F (36.9 C) 98 F (36.7 C) 97.9 F (36.6 C) 98.2 F (36.8 C)  TempSrc: Oral Oral Oral Oral  SpO2: 100% 94% 97% 100%  Weight:      Height:        Intake/Output Summary (Last 24 hours) at 10/04/2019 1329 Last data filed at 10/04/2019 0600 Gross per 24 hour  Intake 532.82 ml  Output 150 ml  Net 382.82 ml   Filed Weights   10/03/19 0733  Weight: 83.9 kg    Exam:  General: NAD, lethargic, deconditioned  Cardiovascular: S1, S2 present  Respiratory:  Bibasilar crackles noted  Abdomen: Soft, nontender, nondistended, bowel sounds present, scrotal swelling noted  Musculoskeletal: No bilateral pedal edema noted  Skin: Normal  Psychiatry: Normal mood    Data Reviewed: CBC: Recent Labs  Lab 09/30/19 1048 10/03/19 1617  WBC 6.5 6.4  HGB 9.7* 9.3*  HCT 34.6* 33.5*  MCV 93.5 91.8  PLT 239 232   Basic Metabolic Panel: Recent Labs  Lab 09/30/19 1048 10/03/19 1617 10/04/19 0309  NA 143 142 142  K 4.6 4.3 3.9  CL 105 102 102  CO2 26 32 31  GLUCOSE 105* 115* 109*  BUN 19 19 20   CREATININE 1.02 1.04 1.07  CALCIUM 9.2 8.8* 8.7*  MG  --   --  2.2   GFR: Estimated Creatinine Clearance: 56.9 mL/min (by C-G formula based on SCr of 1.07 mg/dL). Liver Function Tests: No results for input(s): AST, ALT, ALKPHOS, BILITOT, PROT, ALBUMIN in the last 168 hours. No results for input(s): LIPASE, AMYLASE in the last 168 hours. No results for input(s): AMMONIA in the last 168 hours. Coagulation Profile: Recent Labs  Lab 10/03/19 0741  INR 1.2   Cardiac Enzymes: No results for input(s): CKTOTAL, CKMB, CKMBINDEX, TROPONINI in the last 168 hours. BNP (last 3 results) No results  for input(s): PROBNP in the last 8760 hours. HbA1C: No results for input(s): HGBA1C in the last 72 hours. CBG: No results for input(s): GLUCAP in the last 168 hours. Lipid Profile: No results for input(s): CHOL, HDL, LDLCALC, TRIG, CHOLHDL, LDLDIRECT in the last 72 hours. Thyroid Function Tests: Recent Labs    10/04/19 0309  TSH 1.342   Anemia Panel: Recent Labs    10/04/19 0309  FERRITIN 30  TIBC 386  IRON 28*   Urine analysis:    Component Value Date/Time   COLORURINE STRAW (A) 09/07/2019 1813   APPEARANCEUR CLEAR 09/07/2019 1813   LABSPEC 1.010 09/07/2019 1813   PHURINE 6.0 09/07/2019 1813   GLUCOSEU NEGATIVE 09/07/2019 1813   HGBUR LARGE (A) 09/07/2019 1813   BILIRUBINUR NEGATIVE 09/07/2019 1813   KETONESUR NEGATIVE 09/07/2019 1813   PROTEINUR NEGATIVE 09/07/2019 1813   UROBILINOGEN 1.0 10/06/2013 0000   NITRITE NEGATIVE 09/07/2019 1813   LEUKOCYTESUR NEGATIVE 09/07/2019 1813   Sepsis Labs: @LABRCNTIP (procalcitonin:4,lacticidven:4)  ) Recent Results (from the past 240 hour(s))  SARS CORONAVIRUS 2 (TAT 6-24 HRS) Nasopharyngeal  Nasopharyngeal Swab     Status: None   Collection Time: 09/30/19  9:07 AM   Specimen: Nasopharyngeal Swab  Result Value Ref Range Status   SARS Coronavirus 2 NEGATIVE NEGATIVE Final    Comment: (NOTE) SARS-CoV-2 target nucleic acids are NOT DETECTED. The SARS-CoV-2 RNA is generally detectable in upper and lower respiratory specimens during the acute phase of infection. Negative results do not preclude SARS-CoV-2 infection, do not rule out co-infections with other pathogens, and should not be used as the sole basis for treatment or other patient management decisions. Negative results must be combined with clinical observations, patient history, and epidemiological information. The expected result is Negative. Fact Sheet for Patients: HairSlick.no Fact Sheet for Healthcare  Providers: quierodirigir.com This test is not yet approved or cleared by the [place] FDA and  has been authorized for detection and/or diagnosis of SARS-CoV-2 by FDA under an Emergency Use Authorization (EUA). This EUA will remain  in effect (meaning this test can be used) for the duration of the COVID-19 declaration under Section 56 4(b)(1) of the Act, 21 U.S.C. section 360bbb-3(b)(1), unless the authorization is terminated or revoked sooner. Performed at [facility], [address], [place], [place] [address]       Studies: CT ANGIO CHEST PE W OR WO CONTRAST  Result Date: 10/03/2019 CLINICAL DATA:  Shortness of breath. EXAM: CT ANGIOGRAPHY CHEST WITH CONTRAST TECHNIQUE: Multidetector CT imaging of the chest was performed using the standard protocol during bolus administration of intravenous contrast. Multiplanar CT image reconstructions and MIPs were obtained to evaluate the vascular anatomy. CONTRAST:  75mL OMNIPAQUE IOHEXOL 350 MG/ML SOLN COMPARISON:  09/04/2019. FINDINGS: Cardiovascular: The heart remains enlarged with dense coronary artery calcifications. An aortic stent is unchanged. The distal aortic arch and descending thoracic aorta remains diffusely enlarged. The distal arch measures 5.0 cm in maximum diameter without significant change since 09/04/2019. The lower lung zone pulmonary arteries are poorly opacified with poor mixing of opacified and unopacified blood. No definite pulmonary arterial filling defects are confirmed in 3 planes. The remainder of the pulmonary arteries are normally opacified with no pulmonary arterial filling defects seen. Mediastinum/Nodes: No enlarged mediastinal, hilar, or axillary lymph nodes. Thyroid gland, trachea, and esophagus demonstrate no significant findings. Lungs/Pleura: Interval small bilateral pleural effusions. Minimal patchy ground-glass opacity is again demonstrated in both lungs, remaining greater on the  right. Mild bilateral dependent atelectasis. Interval small oval area of patchy density in the periphery of the right upper lobe measuring 9.6 mm on image number 54 series 6. A previously demonstrated 9 mm solid right upper lobe nodule on image number 56 series 6 is unchanged. This not changed significantly since 02/18/2009. No new nodules are seen. Upper Abdomen: A heterogeneous mass arising from the upper pole of the right kidney has not changed significantly, measuring 2.7 cm in maximum diameter on image number 147 series 5. This was previously stable over multiple examinations other than new soft tissue density. Musculoskeletal: Multiple mildly displaced right posterior rib fractures are again demonstrated. Some of these are healing and others demonstrate no significant callus formation. Review of the MIP images confirms the above findings. IMPRESSION: 1. The lower lung zone pulmonary arteries are poorly opacified with poor mixing of opacified and unopacified blood. No definite pulmonary emboli are seen. 2. Interval small bilateral pleural effusions. 3. Interval small oval area of patchy density in the periphery of the right upper lobe. This could represent a small area of infection or inflammation. 4. Stable  9 mm solid right upper lobe nodule since 02/18/2009. The long-term stability is compatible with a benign process and this does not need follow-up. 5. Stable cardiomegaly and dense coronary artery atherosclerosis. 6. 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 7. Multiple mildly displaced right posterior rib fractures, some of which are healing and others demonstrate no significant callus formation. 8. Stable 2.7 cm heterogeneous mass arising from the upper pole of the right kidney, possibly representing a hemorrhagic cyst as previously discussed. The current appearance can also be seen with malignancy. This could be further evaluated with pre and  postcontrast magnetic resonance imaging of the kidneys on an outpatient basis when the patient is able to follow breathing instructions Electronically Signed   By: Beckie Salts M.D.   On: 10/03/2019 15:23   ECHOCARDIOGRAM COMPLETE  Result Date: 10/03/2019    ECHOCARDIOGRAM REPORT   Patient Name:   LALO TROMP Date of Exam: 10/03/2019 Medical Rec #:  [id number]         Height:       70.0 in Accession #:    [id number]        Weight:       185.0 lb Date of Birth:  [birth year]         BSA:          2.019 m Patient Age:    79 years          BP:           121/66 mmHg Patient Gender: M                 HR:           98 bpm. Exam Location:  Inpatient Procedure: 2D Echo, Cardiac Doppler and Color Doppler Indications:    R07.9* Chest pain, unspecified  History:        Patient has prior history of Echocardiogram examinations, most                 recent 08/23/2013. CHF, CAD, Aortic Valve Disease,                 Arrythmias:Atrial Flutter; Risk Factors:Former Smoker,                 Hypertension and Dyslipidemia. DVT. Thoracic Aortic Aneurysm.                 Aortic Valve: unknown a bioprosthetic valve is present in the                 aortic position.  Sonographer:    Tiffany Dance Referring Phys: [id number] RONDELL A SMITH IMPRESSIONS  1. Diffuse hypokinesis worse in the septum, apex and akinesis of inferior base . Left ventricular ejection fraction, by estimation, is 25 to 30%. The left ventricle has severely decreased function. The left ventricle demonstrates global hypokinesis. The  left ventricular internal cavity size was mildly dilated. Left ventricular diastolic parameters are indeterminate.  2. Right ventricular systolic function is normal. The right ventricular size is normal. There is normal pulmonary artery systolic pressure.  3. Left atrial size was moderately dilated.  4. Right atrial size was moderately dilated.  5. The mitral valve is degenerative. Mild mitral valve regurgitation.  6. By history bioprosthetic  AVR with trivial central AR and acceptable mean gradient of 7 mmHg . The aortic valve has been repaired/replaced. Aortic valve regurgitation is trivial. No aortic stenosis is present. There is a unknown  a bioprosthetic valve present in the aortic position.  7. ? dilated descending thoracic aorta seen on PsL views. FINDINGS  Left Ventricle: Diffuse hypokinesis worse in the septum, apex and akinesis of inferior base. Left ventricular ejection fraction, by estimation, is 25 to 30%. The left ventricle has severely decreased function. The left ventricle demonstrates global hypokinesis. The left ventricular internal cavity size was mildly dilated. There is no left ventricular hypertrophy. Left ventricular diastolic parameters are indeterminate. Right Ventricle: The right ventricular size is normal. Right vetricular wall thickness was not assessed. Right ventricular systolic function is normal. There is normal pulmonary artery systolic pressure. The tricuspid regurgitant velocity is 2.32 m/s, and with an assumed right atrial pressure of 3 mmHg, the estimated right ventricular systolic pressure is 72.5 mmHg. Left Atrium: Left atrial size was moderately dilated. Right Atrium: Right atrial size was moderately dilated. Pericardium: There is no evidence of pericardial effusion. Mitral Valve: The mitral valve is degenerative in appearance. There is mild thickening of the mitral valve leaflet(s). There is mild calcification of the mitral valve leaflet(s). Moderate mitral annular calcification. Mild mitral valve regurgitation. Tricuspid Valve: The tricuspid valve is normal in structure. Tricuspid valve regurgitation is mild. Aortic Valve: By history bioprosthetic AVR with trivial central AR and acceptable mean gradient of 7 mmHg. The aortic valve has been repaired/replaced. Aortic valve regurgitation is trivial. No aortic stenosis is present. Aortic valve mean gradient measures 6.7 mmHg. Aortic valve peak gradient measures 13.7  mmHg. Aortic valve area, by VTI measures 2.05 cm. There is a unknown a bioprosthetic valve present in the aortic position. Pulmonic Valve: The pulmonic valve was normal in structure. Pulmonic valve regurgitation is mild. Aorta: ? dilated descending thoracic aorta seen on PsL views. The aortic root is normal in size and structure. IAS/Shunts: The interatrial septum was not well visualized.  LEFT VENTRICLE PLAX 2D LVIDd:         5.60 cm LVIDs:         4.30 cm LV PW:         1.40 cm LV IVS:        1.10 cm LVOT diam:     2.30 cm LV SV:         72 LV SV Index:   36 LVOT Area:     4.15 cm  RIGHT VENTRICLE             IVC RV Basal diam:  3.80 cm     IVC diam: 2.90 cm RV Mid diam:    1.90 cm RV S prime:     11.30 cm/s TAPSE (M-mode): 2.2 cm LEFT ATRIUM              Index       RIGHT ATRIUM           Index LA diam:        4.70 cm  2.33 cm/m  RA Area:     28.70 cm LA Vol (A2C):   150.0 ml 74.28 ml/m RA Volume:   93.30 ml  46.20 ml/m LA Vol (A4C):   82.7 ml  40.95 ml/m LA Biplane Vol: 120.0 ml 59.43 ml/m  AORTIC VALVE AV Area (Vmax):    2.53 cm AV Area (Vmean):   2.52 cm AV Area (VTI):     2.05 cm AV Vmax:           185.33 cm/s AV Vmean:          113.600 cm/s AV VTI:  0.353 m AV Peak Grad:      13.7 mmHg AV Mean Grad:      6.7 mmHg LVOT Vmax:         113.00 cm/s LVOT Vmean:        69.000 cm/s LVOT VTI:          0.174 m LVOT/AV VTI ratio: 0.49  AORTA Ao Root diam: 3.90 cm Ao Asc diam:  3.20 cm MITRAL VALVE               TRICUSPID VALVE MV Area (PHT): 3.69 cm    TR Peak grad:   21.5 mmHg MV Decel Time: 206 msec    TR Vmax:        232.00 cm/s MV E velocity: 90.65 cm/s                            SHUNTS                            Systemic VTI:  0.17 m                            Systemic Diam: 2.30 cm Charlton Haws MD Electronically signed by Charlton Haws MD Signature Date/Time: 10/03/2019/3:33:27 PM    Final     Scheduled Meds: . amiodarone  200 mg Oral Daily  . aspirin EC  81 mg Oral Daily  . atorvastatin   10 mg Oral q morning - 10a  . furosemide  40 mg Intravenous BID  . loratadine  10 mg Oral Daily  . metoprolol tartrate  12.5 mg Oral BID  . pantoprazole  40 mg Oral Daily  . potassium chloride SA  40 mEq Oral Daily  . sodium chloride flush  3 mL Intravenous Q12H    Continuous Infusions: . sodium chloride    . heparin 950 Units/hr (10/04/19 0600)     LOS: 1 day     Briant Cedar, MD [facility]  If 7PM-7AM, please contact night-coverage [email or web address] 10/04/2019, 1:29 PM

## 2019-10-04 NOTE — Progress Notes (Signed)
ANTICOAGULATION CONSULT NOTE  Pharmacy Consult for heparin Indication: atrial fibrillation  Patient Measurements: Height: 5\' 10"  (177.8 cm) Weight: 83.9 kg (185 lb) IBW/kg (Calculated) : 73  Vital Signs: Temp: 98 F (36.7 C) (04/16 2103) Temp Source: Oral (04/16 2103) BP: 120/67 (04/16 2103) Pulse Rate: 109 (04/16 2103)  Labs: Recent Labs    10/03/19 0741 10/03/19 1617 10/03/19 1823 10/04/19 0309  HGB  --  9.3*  --   --   HCT  --  33.5*  --   --   PLT  --  232  --   --   APTT  --   --   --  73*  LABPROT 15.4*  --   --   --   INR 1.2  --   --   --   HEPARINUNFRC  --   --   --  1.02*  CREATININE  --  1.04  --   --   TROPONINIHS  --  39* 36*  --     Estimated Creatinine Clearance: 58.5 mL/min (by C-G formula based on SCr of 1.04 mg/dL).   Assessment: 79 yo M on apixaban PTA for afib/flutter (CHADS2VASc = 4), LD 4/14 admitted for repair of L inguinal hernia on 4/16 - during pre-op eval became short of breath with acute HF   Hernia surgery cancelled and likely cath on Monday.  Heparin level 1.02 (affected by apixaban) so utilizing PTT for monitoring. PTT 73 sec (therapeutic) on gtt at 950 units/hr. No bleeding noted.  Goal of Therapy:  Hep lvl 0.3 - 0.7 units/ml APTT 66 - 102 sec Monitor platelets by anticoagulation protocol: Yes   Plan:  Continue heparin gtt at 950 units/hr Will f/u 6 hr confirmatory PTT  Thursday, PharmD, BCPS Please see amion for complete clinical pharmacist phone list 10/04/2019 4:11 AM

## 2019-10-04 NOTE — Progress Notes (Signed)
ANTICOAGULATION CONSULT NOTE  Pharmacy Consult for heparin Indication: atrial fibrillation  Patient Measurements: Height: 5\' 10"  (177.8 cm) Weight: 83.9 kg (185 lb) IBW/kg (Calculated) : 73  Vital Signs: Temp: 97.9 F (36.6 C) (04/17 0600) Temp Source: Oral (04/17 0600) BP: 129/69 (04/17 0600) Pulse Rate: 103 (04/17 0600)  Labs: Recent Labs    10/03/19 0741 10/03/19 1617 10/03/19 1823 10/04/19 0309 10/04/19 1002  HGB  --  9.3*  --   --   --   HCT  --  33.5*  --   --   --   PLT  --  232  --   --   --   APTT  --   --   --  73* 67*  LABPROT 15.4*  --   --   --   --   INR 1.2  --   --   --   --   HEPARINUNFRC  --   --   --  1.02*  --   CREATININE  --  1.04  --  1.07  --   TROPONINIHS  --  39* 36*  --   --     Estimated Creatinine Clearance: 56.9 mL/min (by C-G formula based on SCr of 1.07 mg/dL).   Assessment: 78 yo M on apixaban PTA for afib/flutter (CHADS2VASc = 4), LD 4/14 admitted for repair of L inguinal hernia on 4/16 - during pre-op eval became short of breath with acute HF  Hernia surgery cancelled and likely cath on Monday.  EARLY AM: Heparin level 1.02 (affected by apixaban) so utilizing PTT for monitoring. PTT 73 sec (therapeutic) on gtt at 950 units/hr. No bleeding noted.  Recent update: Confirmatory PTT at 10am this morning is within range at 67 sec (therapeutic). No bleeding noted.   Goal of Therapy:  Hep lvl 0.3 - 0.7 units/ml APTT 66 - 102 sec Monitor platelets by anticoagulation protocol: Yes   Plan:  Continue heparin gtt at 950 units/hr Monitor HL / APTT daily until correlating.   Thank you for the interesting consult and for involving pharmacy in this patient's care.  Friday, PharmD, BCPS 10/04/2019 10:57 AM PGY-2 Pharmacy Administration Resident Please check [email or web address] for unit-specific pharmacist phone numbers

## 2019-10-05 DIAGNOSIS — J9621 Acute and chronic respiratory failure with hypoxia: Secondary | ICD-10-CM | POA: Diagnosis not present

## 2019-10-05 LAB — BASIC METABOLIC PANEL
Anion gap: 8 (ref 5–15)
BUN: 21 mg/dL (ref 8–23)
CO2: 31 mmol/L (ref 22–32)
Calcium: 8.5 mg/dL — ABNORMAL LOW (ref 8.9–10.3)
Chloride: 102 mmol/L (ref 98–111)
Creatinine, Ser: 0.99 mg/dL (ref 0.61–1.24)
GFR calc Af Amer: >60 mL/min (ref >60)
GFR calc non Af Amer: >60 mL/min (ref >60)
Glucose, Bld: 97 mg/dL (ref 70–99)
Potassium: 3.7 mmol/L (ref 3.5–5.1)
Sodium: 141 mmol/L (ref 135–145)

## 2019-10-05 LAB — CBC
HCT: 31 % — ABNORMAL LOW (ref 39.0–52.0)
Hemoglobin: 8.8 g/dL — ABNORMAL LOW (ref 13.0–17.0)
MCH: 25.8 pg — ABNORMAL LOW (ref 26.0–34.0)
MCHC: 28.4 g/dL — ABNORMAL LOW (ref 30.0–36.0)
MCV: 90.9 fL (ref 80.0–100.0)
Platelets: 218 10*3/uL (ref 150–400)
RBC: 3.41 MIL/uL — ABNORMAL LOW (ref 4.22–5.81)
RDW: 17.1 % — ABNORMAL HIGH (ref 11.5–15.5)
WBC: 5.4 10*3/uL (ref 4.0–10.5)
nRBC: 0 % (ref 0.0–0.2)

## 2019-10-05 LAB — CBC WITH DIFFERENTIAL/PLATELET
Abs Immature Granulocytes: 0.02 10*3/uL (ref 0.00–0.07)
Basophils Absolute: 0 10*3/uL (ref 0.0–0.1)
Basophils Relative: 0 %
Eosinophils Absolute: 0.2 10*3/uL (ref 0.0–0.5)
Eosinophils Relative: 5 %
HCT: 28.5 % — ABNORMAL LOW (ref 39.0–52.0)
Hemoglobin: 8.1 g/dL — ABNORMAL LOW (ref 13.0–17.0)
Immature Granulocytes: 0 %
Lymphocytes Relative: 15 %
Lymphs Abs: 0.7 10*3/uL (ref 0.7–4.0)
MCH: 26 pg (ref 26.0–34.0)
MCHC: 28.4 g/dL — ABNORMAL LOW (ref 30.0–36.0)
MCV: 91.6 fL (ref 80.0–100.0)
Monocytes Absolute: 0.5 10*3/uL (ref 0.1–1.0)
Monocytes Relative: 11 %
Neutro Abs: 3.1 10*3/uL (ref 1.7–7.7)
Neutrophils Relative %: 69 %
Platelets: 194 10*3/uL (ref 150–400)
RBC: 3.11 MIL/uL — ABNORMAL LOW (ref 4.22–5.81)
RDW: 16.7 % — ABNORMAL HIGH (ref 11.5–15.5)
WBC: 4.6 10*3/uL (ref 4.0–10.5)
nRBC: 0 % (ref 0.0–0.2)

## 2019-10-05 LAB — TYPE AND SCREEN
ABO/RH(D): A POS
Antibody Screen: NEGATIVE

## 2019-10-05 LAB — HEPARIN LEVEL (UNFRACTIONATED): Heparin Unfractionated: 0.76 IU/mL — ABNORMAL HIGH (ref 0.30–0.70)

## 2019-10-05 LAB — APTT: aPTT: 70 seconds — ABNORMAL HIGH (ref 24–36)

## 2019-10-05 MED ORDER — SODIUM CHLORIDE 0.9 % WEIGHT BASED INFUSION
1.0000 mL/kg/h | INTRAVENOUS | Status: DC
  Administered 2019-10-06: 04:00:00 1.001 mL/kg/h via INTRAVENOUS
  Administered 2019-10-06: 14:00:00 1 mL/kg/h via INTRAVENOUS

## 2019-10-05 MED ORDER — ASPIRIN 81 MG PO CHEW
81.0000 mg | CHEWABLE_TABLET | ORAL | Status: AC
  Administered 2019-10-06: 05:00:00 81 mg via ORAL
  Filled 2019-10-05: qty 1

## 2019-10-05 MED ORDER — SODIUM CHLORIDE 0.9 % WEIGHT BASED INFUSION
3.0000 mL/kg/h | INTRAVENOUS | Status: DC
  Administered 2019-10-06: 3 mL/kg/h via INTRAVENOUS

## 2019-10-05 MED ORDER — SODIUM CHLORIDE 0.9 % IV SOLN: 250.0000 mL | INTRAVENOUS | Status: DC | PRN

## 2019-10-05 MED ORDER — SODIUM CHLORIDE 0.9% FLUSH: 3.0000 mL | INTRAVENOUS | Status: DC | PRN

## 2019-10-05 MED ORDER — SODIUM CHLORIDE 0.9% FLUSH
3.0000 mL | Freq: Two times a day (BID) | INTRAVENOUS | Status: DC
  Administered 2019-10-05 – 2019-10-06 (×2): 3 mL via INTRAVENOUS

## 2019-10-05 MED ORDER — METOPROLOL TARTRATE 25 MG PO TABS
25.0000 mg | ORAL_TABLET | Freq: Two times a day (BID) | ORAL | Status: DC
  Administered 2019-10-05 – 2019-10-07 (×4): 25 mg via ORAL
  Filled 2019-10-05 (×5): qty 1

## 2019-10-05 NOTE — Progress Notes (Signed)
Progress Note  Patient Name: [name] [name] Date of Encounter: 10/05/2019  Primary Cardiologist:   Larae Grooms, MD   Subjective   Denies chest pain or SOB.  EKG done because monitor suggested ST elevation.  However, there was ST depression not significantly changed from previous.    Inpatient Medications    Scheduled Meds: . amiodarone  200 mg Oral Daily  . aspirin EC  81 mg Oral Daily  . atorvastatin  10 mg Oral q morning - 10a  . ferrous sulfate  325 mg Oral Q breakfast  . furosemide  40 mg Intravenous BID  . loratadine  10 mg Oral Daily  . metoprolol tartrate  12.5 mg Oral BID  . pantoprazole  40 mg Oral Daily  . polyethylene glycol  17 g Oral Daily  . potassium chloride SA  40 mEq Oral Daily  . sodium chloride flush  3 mL Intravenous Q12H   Continuous Infusions: . sodium chloride    . heparin 950 Units/hr (10/05/19 0610)   PRN Meds: sodium chloride, acetaminophen, methocarbamol, oxyCODONE, sodium chloride flush   Vital Signs    Vitals:   10/03/19 2103 10/04/19 0600 10/04/19 1316 10/04/19 2000  BP: 120/67 129/69 (!) 115/57 134/76  Pulse: (!) 109 (!) 103 (!) 108 (!) 105  Resp: 18 20 16 16   Temp: 98 F (36.7 C) 97.9 F (36.6 C) 98.2 F (36.8 C) 99.2 F (37.3 C)  TempSrc: Oral Oral Oral Oral  SpO2: 94% 97% 100% 100%  Weight:      Height:        Intake/Output Summary (Last 24 hours) at 10/05/2019 1413 Last data filed at 10/05/2019 0950 Gross per 24 hour  Intake 349.59 ml  Output 1600 ml  Net -1250.41 ml   Filed Weights   10/03/19 0733  Weight: 83.9 kg    Telemetry    NSR with ectopy - Personally Reviewed  ECG    Sinus tach.  Unable to print and they cannot download.  I saw it briefly and there diffuse ST depression in the inferior and lateral leads.   - Personally Reviewed  Physical Exam   GEN: No acute distress.   Neck: No  JVD Cardiac: RRR, no murmurs, rubs, or gallops.  Respiratory:   Decreased breath sounds.  No crackles.  GI:  Soft, nontender, non-distended  MS: No edema; No deformity. Neuro:  Nonfocal  Psych: Normal affect   Labs    Chemistry Recent Labs  Lab 10/03/19 1617 10/04/19 0309 10/05/19 0442  NA 142 142 141  K 4.3 3.9 3.7  CL 102 102 102  CO2 32 31 31  GLUCOSE 115* 109* 97  BUN 19 20 21   CREATININE 1.04 1.07 0.99  CALCIUM 8.8* 8.7* 8.5*  GFRNONAA >60 >60 >60  GFRAA >60 >60 >60  ANIONGAP 8 9 8      Hematology Recent Labs  Lab 09/30/19 1048 10/03/19 1617 10/05/19 0442  WBC 6.5 6.4 4.6  RBC 3.70* 3.65* 3.11*  HGB 9.7* 9.3* 8.1*  HCT 34.6* 33.5* 28.5*  MCV 93.5 91.8 91.6  MCH 26.2 25.5* 26.0  MCHC 28.0* 27.8* 28.4*  RDW 16.8* 17.0* 16.7*  PLT 239 232 194    Cardiac EnzymesNo results for input(s): TROPONINI in the last 168 hours. No results for input(s): TROPIPOC in the last 168 hours.   BNP Recent Labs  Lab 10/03/19 1048  BNP 1,334.6*     DDimer No results for input(s): DDIMER in the last 168 hours.  Radiology    CT ANGIO CHEST PE W OR WO CONTRAST  Result Date: 10/03/2019 CLINICAL DATA:  Shortness of breath. EXAM: CT ANGIOGRAPHY CHEST WITH CONTRAST TECHNIQUE: Multidetector CT imaging of the chest was performed using the standard protocol during bolus administration of intravenous contrast. Multiplanar CT image reconstructions and MIPs were obtained to evaluate the vascular anatomy. CONTRAST:  68mL OMNIPAQUE IOHEXOL 350 MG/ML SOLN COMPARISON:  09/04/2019. FINDINGS: Cardiovascular: The heart remains enlarged with dense coronary artery calcifications. An aortic stent is unchanged. The distal aortic arch and descending thoracic aorta remains diffusely enlarged. The distal arch measures 5.0 cm in maximum diameter without significant change since 09/04/2019. The lower lung zone pulmonary arteries are poorly opacified with poor mixing of opacified and unopacified blood. No definite pulmonary arterial filling defects are confirmed in 3 planes. The remainder of the pulmonary arteries  are normally opacified with no pulmonary arterial filling defects seen. Mediastinum/Nodes: No enlarged mediastinal, hilar, or axillary lymph nodes. Thyroid gland, trachea, and esophagus demonstrate no significant findings. Lungs/Pleura: Interval small bilateral pleural effusions. Minimal patchy ground-glass opacity is again demonstrated in both lungs, remaining greater on the right. Mild bilateral dependent atelectasis. Interval small oval area of patchy density in the periphery of the right upper lobe measuring 9.6 mm on image number 54 series 6. A previously demonstrated 9 mm solid right upper lobe nodule on image number 56 series 6 is unchanged. This not changed significantly since 02/18/2009. No new nodules are seen. Upper Abdomen: A heterogeneous mass arising from the upper pole of the right kidney has not changed significantly, measuring 2.7 cm in maximum diameter on image number 147 series 5. This was previously stable over multiple examinations other than new soft tissue density. Musculoskeletal: Multiple mildly displaced right posterior rib fractures are again demonstrated. Some of these are healing and others demonstrate no significant callus formation. Review of the MIP images confirms the above findings. IMPRESSION: 1. The lower lung zone pulmonary arteries are poorly opacified with poor mixing of opacified and unopacified blood. No definite pulmonary emboli are seen. 2. Interval small bilateral pleural effusions. 3. Interval small oval area of patchy density in the periphery of the right upper lobe. This could represent a small area of infection or inflammation. 4. Stable 9 mm solid right upper lobe nodule since 02/18/2009. The long-term stability is compatible with a benign process and this does not need follow-up. 5. Stable cardiomegaly and dense coronary artery atherosclerosis. 6. 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 7. Multiple mildly  displaced right posterior rib fractures, some of which are healing and others demonstrate no significant callus formation. 8. Stable 2.7 cm heterogeneous mass arising from the upper pole of the right kidney, possibly representing a hemorrhagic cyst as previously discussed. The current appearance can also be seen with malignancy. This could be further evaluated with pre and postcontrast magnetic resonance imaging of the kidneys on an outpatient basis when the patient is able to follow breathing instructions Electronically Signed   By: Beckie Salts M.D.   On: 10/03/2019 15:23    Cardiac Studies   ECHO:     1. Diffuse hypokinesis worse in the septum, apex and akinesis of inferior base . Left ventricular ejection fraction, by estimation, is 25 to 30%. The left ventricle has severely decreased function. The left ventricle demonstrates global hypokinesis. The left ventricular internal cavity size was mildly dilated. Left ventricular diastolic parameters are indeterminate. 2. Right ventricular systolic function is normal. The  right ventricular size is normal. There is normal pulmonary artery systolic pressure. 3. Left atrial size was moderately dilated. 4. Right atrial size was moderately dilated. 5. The mitral valve is degenerative. Mild mitral valve regurgitation. 6. By history bioprosthetic AVR with trivial central AR and acceptable mean gradient of 7 mmHg . The aortic valve has been repaired/replaced. Aortic valve regurgitation is trivial. No aortic stenosis is present. There is a unknown a bioprosthetic valve present in the aortic position. 7.  dilated descending thoracic aorta seen on PsL views.   Patient Profile     79 y.o. 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  Assessment & Plan     New onset systolic heart failure:   Net negative 846 this admission.  Continue IV Lasix.  I will increase beta blocker slightly.    Paroxysmal atrial fibrillation:  Transitioned to heparin.   Rate OK.    Hx of AVR:  Stable on echo.    Anemia:  Hgb continues to fall.  Fe deficient.  Actively being managed by primary team.   He has had no evidence of ongoing bleeding.      For questions or updates, please contact [facility] Please consult [email or web address] for contact info under Cardiology/STEMI.   Signed, Rollene Rotunda, MD  10/05/2019, 2:13 PM

## 2019-10-05 NOTE — Plan of Care (Signed)
  Problem: Education: Goal: Knowledge of General Education information will improve Description: Including pain rating scale, medication(s)/side effects and non-pharmacologic comfort measures 10/05/2019 2246 by Nanine Means, RN Outcome: Progressing 10/05/2019 2245 by Nanine Means, RN Outcome: Progressing

## 2019-10-05 NOTE — Progress Notes (Signed)
PROGRESS NOTE  [name] [name] WGN:[id number] DOB: Sep 13, 1940 DOA: 10/03/2019 PCP: [name] [name]  HPI/Recap of past 24 hours: HPI from [name] [name] is a 79 y.o. male with medical history significant of atrial flutter/atrial fibrillation on Eliquis, CHF, CAD, DVT, aortic valve repair, and aortic aneurysm s/p repair with stent and carotid subclavian bypass presented for left inguinal hernia repair.  However, after getting his portable chest x-ray he became acutely short of breath with wheezing. At baseline patient also reports that he is not on oxygen and has never had issues with wheezing before. He complained of feeling very sweaty and having left-sided chest pain.  Patient was noted to be hypoxic down to 88% on room air and was placed on 2 L nasal cannula oxygen with improvement in oxygenation.  Chest x-ray revealed signs concerning for interstitial edema.  Patient had fallen last month and suffered several right rib fractures. Pt had stopped taking Eliquis 2 days ago in preparation for this surgery.  Review of records notes that patient's EF was around 55% back in 2014.  Labs from 4/13 revealed hemoglobin 9.7, but appear to be near his baseline.  Due to the patient's symptoms surgery was canceled and [facility] was called to admit.    Today, patient still very weak, with significant shortness of breath, denies any chest pain, abdominal pain, nausea/vomiting, fever/chills, hematuria, melena, bright red blood per rectum.   Assessment/Plan: Principal Problem:   Acute on chronic respiratory failure with hypoxia (HCC) Active Problems:   HTN (hypertension)   Hypochromic anemia   S/P AVR (aortic valve replacement)   Atrial fibrillation (HCC)   Chest pain   Left inguinal hernia   Acute hypoxic respiratory failure likely 2/2 new onset systolic HF BNP showed 1334, elevated from previous Troponins with flat trend, EKG with no acute ST changes Chest x-ray with pulmonary  vascular congestion Echo done on 10/03/2019 showed EF of 25 to 30%, diffuse hypokinesis, bioprosthetic AVR, EF more reduced than previous of 55% Cardiology consulted, continue Lasix, IV heparin, plan for LHC on 10/06/2019 Strict I's and O's, daily weights  Chest pain/history of CAD/history of aortic aneurysm/status post aortic valve replacement Currently chest pain-free Troponin, EKG as above Echo as above Cardiology on board, plan for LHC on 10/06/2019  Paroxysmal A. Fib Heart rate about 110s Continue amiodarone, metoprolol, IV heparin for now Hold home Eliquis for now  Inguinal hernia Denies any abdominal/scrotal pain Surgery with [name]. [name] postponed due to new onset HF Follow-up with general surgery  Hypertension Continue metoprolol  Iron deficiency anemia/normocytic anemia Hemoglobin with a slight downward trend No obvious signs of bleeding Anemia panel showed iron 28, sat 7, TIBC 386, ferritin 30 Start oral iron supplementation, will give him 1 dose of Feraheme prior to discharge once more stable Trend CBC, every 12 hours, type and screen  Right rib fractures after fall Imaging studies shows healing Incentive spirometry PT/OT once stable  Mass in right kidney Stable 2.7 cm heterogeneous mass arising from the upper pole of the right kidney possibly representing a hemorrhagic cyst, can also be seen with malignancy.  MRI kidney on outpatient basis may be done to rule out malignancy, ??given his anemia        Malnutrition Type:      Malnutrition Characteristics:      Nutrition Interventions:       Estimated body mass index is 26.54 kg/m as calculated from the following:   Height as of this encounter:  5\' 10"  (1.778 m).   Weight as of this encounter: 83.9 kg.     Code Status: Full  Family Communication: Discussed extensively with wife at bedside on 10/05/2019  Disposition Plan: Patient came from home, still requires further work-up procedure and  consultants sign off, pending PT/OT.    Consultants:  General surgery  Cardiology  Procedures:  None  Antimicrobials:  None  DVT prophylaxis: Heparin drip    Objective: Vitals:   10/04/19 0600 10/04/19 1316 10/04/19 2000 10/05/19 1536  BP: 129/69 (!) 115/57 134/76 111/63  Pulse: (!) 103 (!) 108 (!) 105 92  Resp: 20 16 16 18   Temp: 97.9 F (36.6 C) 98.2 F (36.8 C) 99.2 F (37.3 C) 98.4 F (36.9 C)  TempSrc: Oral Oral Oral Oral  SpO2: 97% 100% 100% 100%  Weight:      Height:        Intake/Output Summary (Last 24 hours) at 10/05/2019 1641 Last data filed at 10/05/2019 1300 Gross per 24 hour  Intake 949.59 ml  Output 1600 ml  Net -650.41 ml   Filed Weights   10/03/19 0733  Weight: 83.9 kg    Exam:  General: NAD, lethargic, deconditioned  Cardiovascular: S1, S2 present  Respiratory:  Bibasilar crackles noted  Abdomen: Soft, nontender, nondistended, bowel sounds present, scrotal swelling noted  Musculoskeletal: No bilateral pedal edema noted  Skin: Normal  Psychiatry: Normal mood    Data Reviewed: CBC: Recent Labs  Lab 09/30/19 1048 10/03/19 1617 10/05/19 0442  WBC 6.5 6.4 4.6  NEUTROABS  --   --  3.1  HGB 9.7* 9.3* 8.1*  HCT 34.6* 33.5* 28.5*  MCV 93.5 91.8 91.6  PLT 239 232 194   Basic Metabolic Panel: Recent Labs  Lab 09/30/19 1048 10/03/19 1617 10/04/19 0309 10/05/19 0442  NA 143 142 142 141  K 4.6 4.3 3.9 3.7  CL 105 102 102 102  CO2 26 32 31 31  GLUCOSE 105* 115* 109* 97  BUN 19 19 20 21   CREATININE 1.02 1.04 1.07 0.99  CALCIUM 9.2 8.8* 8.7* 8.5*  MG  --   --  2.2  --    GFR: Estimated Creatinine Clearance: 61.4 mL/min (by C-G formula based on SCr of 0.99 mg/dL). Liver Function Tests: No results for input(s): AST, ALT, ALKPHOS, BILITOT, PROT, ALBUMIN in the last 168 hours. No results for input(s): LIPASE, AMYLASE in the last 168 hours. No results for input(s): AMMONIA in the last 168 hours. Coagulation  Profile: Recent Labs  Lab 10/03/19 0741  INR 1.2   Cardiac Enzymes: No results for input(s): CKTOTAL, CKMB, CKMBINDEX, TROPONINI in the last 168 hours. BNP (last 3 results) No results for input(s): PROBNP in the last 8760 hours. HbA1C: No results for input(s): HGBA1C in the last 72 hours. CBG: No results for input(s): GLUCAP in the last 168 hours. Lipid Profile: No results for input(s): CHOL, HDL, LDLCALC, TRIG, CHOLHDL, LDLDIRECT in the last 72 hours. Thyroid Function Tests: Recent Labs    10/04/19 0309  TSH 1.342   Anemia Panel: Recent Labs    10/04/19 0309  FERRITIN 30  TIBC 386  IRON 28*   Urine analysis:    Component Value Date/Time   COLORURINE STRAW (A) 09/07/2019 1813   APPEARANCEUR CLEAR 09/07/2019 1813   LABSPEC 1.010 09/07/2019 1813   PHURINE 6.0 09/07/2019 1813   GLUCOSEU NEGATIVE 09/07/2019 1813   HGBUR LARGE (A) 09/07/2019 1813   BILIRUBINUR NEGATIVE 09/07/2019 1813   KETONESUR NEGATIVE 09/07/2019 1813  PROTEINUR NEGATIVE 09/07/2019 1813   UROBILINOGEN 1.0 10/06/2013 0000   NITRITE NEGATIVE 09/07/2019 1813   LEUKOCYTESUR NEGATIVE 09/07/2019 1813   Sepsis Labs: @LABRCNTIP (procalcitonin:4,lacticidven:4)  ) Recent Results (from the past 240 hour(s))  SARS CORONAVIRUS 2 (TAT 6-24 HRS) Nasopharyngeal Nasopharyngeal Swab     Status: None   Collection Time: 09/30/19  9:07 AM   Specimen: Nasopharyngeal Swab  Result Value Ref Range Status   SARS Coronavirus 2 NEGATIVE NEGATIVE Final    Comment: (NOTE) SARS-CoV-2 target nucleic acids are NOT DETECTED. The SARS-CoV-2 RNA is generally detectable in upper and lower respiratory specimens during the acute phase of infection. Negative results do not preclude SARS-CoV-2 infection, do not rule out co-infections with other pathogens, and should not be used as the sole basis for treatment or other patient management decisions. Negative results must be combined with clinical observations, patient history, and  epidemiological information. The expected result is Negative. Fact Sheet for Patients: SugarRoll.be Fact Sheet for Healthcare Providers: https://www.woods-mathews.com/ This test is not yet approved or cleared by the [place] FDA and  has been authorized for detection and/or diagnosis of SARS-CoV-2 by FDA under an Emergency Use Authorization (EUA). This EUA will remain  in effect (meaning this test can be used) for the duration of the COVID-19 declaration under Section 56 4(b)(1) of the Act, 21 U.S.C. section 360bbb-3(b)(1), unless the authorization is terminated or revoked sooner. Performed at [facility], [place] [address] [place], [place] [address]       Studies: No results found.  Scheduled Meds: . amiodarone  200 mg Oral Daily  . aspirin EC  81 mg Oral Daily  . atorvastatin  10 mg Oral q morning - 10a  . ferrous sulfate  325 mg Oral Q breakfast  . furosemide  40 mg Intravenous BID  . loratadine  10 mg Oral Daily  . metoprolol tartrate  25 mg Oral BID  . pantoprazole  40 mg Oral Daily  . polyethylene glycol  17 g Oral Daily  . potassium chloride SA  40 mEq Oral Daily  . sodium chloride flush  3 mL Intravenous Q12H    Continuous Infusions: . sodium chloride    . heparin 950 Units/hr (10/05/19 0610)     LOS: 2 days     Alma Friendly, [name]  If 7PM-7AM, please contact night-coverage [email or web address] 10/05/2019, 4:41 PM

## 2019-10-05 NOTE — Progress Notes (Addendum)
ANTICOAGULATION CONSULT NOTE  Pharmacy Consult for heparin Indication: atrial fibrillation  Patient Measurements: Height: 5\' 10"  (177.8 cm) Weight: 83.9 kg (185 lb) IBW/kg (Calculated) : 73  Vital Signs: Temp: 99.2 F (37.3 C) (04/17 2000) Temp Source: Oral (04/17 2000) BP: 134/76 (04/17 2000) Pulse Rate: 105 (04/17 2000)  Labs: Recent Labs    10/03/19 0741 10/03/19 1617 10/03/19 1823 10/04/19 0309 10/04/19 1002 10/05/19 0442  HGB  --  9.3*  --   --   --  8.1*  HCT  --  33.5*  --   --   --  28.5*  PLT  --  232  --   --   --  194  APTT  --   --   --  73* 67* 70*  LABPROT 15.4*  --   --   --   --   --   INR 1.2  --   --   --   --   --   HEPARINUNFRC  --   --   --  1.02*  --  0.76*  CREATININE  --  1.04  --  1.07  --  0.99  TROPONINIHS  --  39* 36*  --   --   --     Estimated Creatinine Clearance: 61.4 mL/min (by C-G formula based on SCr of 0.99 mg/dL).   Assessment: 79 yo M on apixaban PTA for afib/flutter (CHADS2VASc = 4), LD 4/14 admitted for repair of L inguinal hernia on 4/16 - during pre-op eval became short of breath with acute HF  Hernia surgery cancelled and Cath planned for Monday.  Heparin level trending down from 1.02 > 0.76, this is still elevated and not correlating with aPTT just quite yet.  APTT level therapeutic at 70 today (up slightly from 67).  CBC slight drop but no signs or symptoms of bleeding noted, and confirmed with NR patient has no signs or symptoms of bleeding. No changes needed at this time.   Goal of Therapy:  Heparin level 0.3 - 0.7 units/ml APTT 66 - 102 sec Monitor platelets by anticoagulation protocol: Yes   Plan:  Continue heparin gtt at 950 units/hr Monitor HL / APTT daily until correlating.   Thank you for the interesting consult and for involving pharmacy in this patient's care.  Wednesday, PharmD, BCPS 10/05/2019 7:10 AM PGY-2 Pharmacy Administration Resident Please check [email or web address] for unit-specific pharmacist phone  numbers

## 2019-10-05 NOTE — Plan of Care (Signed)
  Problem: Education: Goal: Knowledge of General Education information will improve Description Including pain rating scale, medication(s)/side effects and non-pharmacologic comfort measures Outcome: Progressing   

## 2019-10-06 ENCOUNTER — Inpatient Hospital Stay (HOSPITAL_COMMUNITY)
Admission: AD | Disposition: A | Discharge status: Home Health | Payer: Self-pay | Source: Home / Self Care | Attending: Internal Medicine

## 2019-10-06 DIAGNOSIS — I5022 Chronic systolic (congestive) heart failure: Secondary | ICD-10-CM | POA: Diagnosis not present

## 2019-10-06 DIAGNOSIS — I480 Paroxysmal atrial fibrillation: Secondary | ICD-10-CM | POA: Diagnosis not present

## 2019-10-06 DIAGNOSIS — D509 Iron deficiency anemia, unspecified: Secondary | ICD-10-CM | POA: Diagnosis not present

## 2019-10-06 DIAGNOSIS — L8990 Pressure ulcer of unspecified site, unspecified stage: Secondary | ICD-10-CM | POA: Insufficient documentation

## 2019-10-06 DIAGNOSIS — I2510 Atherosclerotic heart disease of native coronary artery without angina pectoris: Secondary | ICD-10-CM | POA: Diagnosis not present

## 2019-10-06 DIAGNOSIS — J9621 Acute and chronic respiratory failure with hypoxia: Secondary | ICD-10-CM | POA: Diagnosis not present

## 2019-10-06 DIAGNOSIS — I5021 Acute systolic (congestive) heart failure: Secondary | ICD-10-CM | POA: Diagnosis not present

## 2019-10-06 HISTORY — PX: RIGHT/LEFT HEART CATH AND CORONARY ANGIOGRAPHY: CATH118266

## 2019-10-06 HISTORY — DX: Pressure ulcer of unspecified site, unspecified stage: L89.90

## 2019-10-06 LAB — BASIC METABOLIC PANEL
Anion gap: 11 (ref 5–15)
BUN: 20 mg/dL (ref 8–23)
CO2: 32 mmol/L (ref 22–32)
Calcium: 8.9 mg/dL (ref 8.9–10.3)
Chloride: 100 mmol/L (ref 98–111)
Creatinine, Ser: 0.98 mg/dL (ref 0.61–1.24)
GFR calc Af Amer: >60 mL/min (ref >60)
GFR calc non Af Amer: >60 mL/min (ref >60)
Glucose, Bld: 94 mg/dL (ref 70–99)
Potassium: 3.5 mmol/L (ref 3.5–5.1)
Sodium: 143 mmol/L (ref 135–145)

## 2019-10-06 LAB — CBC WITH DIFFERENTIAL/PLATELET
Abs Immature Granulocytes: 0.03 10*3/uL (ref 0.00–0.07)
Basophils Absolute: 0 10*3/uL (ref 0.0–0.1)
Basophils Relative: 0 %
Eosinophils Absolute: 0.2 10*3/uL (ref 0.0–0.5)
Eosinophils Relative: 4 %
HCT: 30 % — ABNORMAL LOW (ref 39.0–52.0)
Hemoglobin: 8.5 g/dL — ABNORMAL LOW (ref 13.0–17.0)
Immature Granulocytes: 1 %
Lymphocytes Relative: 14 %
Lymphs Abs: 0.7 10*3/uL (ref 0.7–4.0)
MCH: 26.2 pg (ref 26.0–34.0)
MCHC: 28.3 g/dL — ABNORMAL LOW (ref 30.0–36.0)
MCV: 92.3 fL (ref 80.0–100.0)
Monocytes Absolute: 0.5 10*3/uL (ref 0.1–1.0)
Monocytes Relative: 9 %
Neutro Abs: 3.9 10*3/uL (ref 1.7–7.7)
Neutrophils Relative %: 72 %
Platelets: 231 10*3/uL (ref 150–400)
RBC: 3.25 MIL/uL — ABNORMAL LOW (ref 4.22–5.81)
RDW: 17 % — ABNORMAL HIGH (ref 11.5–15.5)
WBC: 5.4 10*3/uL (ref 4.0–10.5)
nRBC: 0 % (ref 0.0–0.2)

## 2019-10-06 LAB — POCT I-STAT 7, (LYTES, BLD GAS, ICA,H+H)
Acid-Base Excess: 3 mmol/L — ABNORMAL HIGH (ref 0.0–2.0)
Bicarbonate: 27.5 mmol/L (ref 20.0–28.0)
Calcium, Ion: 1.19 mmol/L (ref 1.15–1.40)
HCT: 28 % — ABNORMAL LOW (ref 39.0–52.0)
Hemoglobin: 9.5 g/dL — ABNORMAL LOW (ref 13.0–17.0)
O2 Saturation: 99 %
Potassium: 4 mmol/L (ref 3.5–5.1)
Sodium: 141 mmol/L (ref 135–145)
TCO2: 29 mmol/L (ref 22–32)
pCO2 arterial: 41.3 mmHg (ref 32.0–48.0)
pH, Arterial: 7.432 (ref 7.350–7.450)
pO2, Arterial: 127 mmHg — ABNORMAL HIGH (ref 83.0–108.0)

## 2019-10-06 LAB — HEPARIN LEVEL (UNFRACTIONATED): Heparin Unfractionated: 0.54 IU/mL (ref 0.30–0.70)

## 2019-10-06 LAB — APTT: aPTT: 64 seconds — ABNORMAL HIGH (ref 24–36)

## 2019-10-06 LAB — POCT I-STAT EG7
Acid-Base Excess: 2 mmol/L (ref 0.0–2.0)
Bicarbonate: 27.6 mmol/L (ref 20.0–28.0)
Calcium, Ion: 1.21 mmol/L (ref 1.15–1.40)
HCT: 28 % — ABNORMAL LOW (ref 39.0–52.0)
Hemoglobin: 9.5 g/dL — ABNORMAL LOW (ref 13.0–17.0)
O2 Saturation: 60 %
Potassium: 4 mmol/L (ref 3.5–5.1)
Sodium: 141 mmol/L (ref 135–145)
TCO2: 29 mmol/L (ref 22–32)
pCO2, Ven: 45 mmHg (ref 44.0–60.0)
pH, Ven: 7.396 (ref 7.250–7.430)
pO2, Ven: 32 mmHg (ref 32.0–45.0)

## 2019-10-06 SURGERY — RIGHT/LEFT HEART CATH AND CORONARY ANGIOGRAPHY
Anesthesia: LOCAL

## 2019-10-06 MED ORDER — HYDRALAZINE HCL 20 MG/ML IJ SOLN: 10.0000 mg | INTRAMUSCULAR | Status: AC | PRN

## 2019-10-06 MED ORDER — MIDAZOLAM HCL 2 MG/2ML IJ SOLN
INTRAMUSCULAR | Status: DC | PRN
  Administered 2019-10-06 (×2): 0.5 mg via INTRAVENOUS

## 2019-10-06 MED ORDER — SODIUM CHLORIDE 0.9% FLUSH: 3.0000 mL | INTRAVENOUS | Status: DC | PRN

## 2019-10-06 MED ORDER — LIDOCAINE HCL (PF) 1 % IJ SOLN
INTRAMUSCULAR | Status: AC
  Filled 2019-10-06: qty 30

## 2019-10-06 MED ORDER — ACETAMINOPHEN 325 MG PO TABS: 650.0000 mg | ORAL_TABLET | ORAL | Status: DC | PRN

## 2019-10-06 MED ORDER — SODIUM CHLORIDE 0.9% FLUSH
3.0000 mL | Freq: Two times a day (BID) | INTRAVENOUS | Status: DC
  Administered 2019-10-07: 09:00:00 3 mL via INTRAVENOUS

## 2019-10-06 MED ORDER — IOHEXOL 350 MG/ML SOLN
INTRAVENOUS | Status: DC | PRN
  Administered 2019-10-06: 16:00:00 90 mL

## 2019-10-06 MED ORDER — VERAPAMIL HCL 2.5 MG/ML IV SOLN
INTRAVENOUS | Status: DC | PRN
  Administered 2019-10-06: 10 mL via INTRA_ARTERIAL

## 2019-10-06 MED ORDER — SODIUM CHLORIDE 0.9 % IV SOLN
INTRAVENOUS | Status: AC | PRN
  Administered 2019-10-06: 10 mL/h via INTRAVENOUS

## 2019-10-06 MED ORDER — ONDANSETRON HCL 4 MG/2ML IJ SOLN: 4.0000 mg | Freq: Four times a day (QID) | INTRAMUSCULAR | Status: DC | PRN

## 2019-10-06 MED ORDER — HEPARIN SODIUM (PORCINE) 5000 UNIT/ML IJ SOLN: 5000.0000 [IU] | Freq: Three times a day (TID) | INTRAMUSCULAR | Status: DC

## 2019-10-06 MED ORDER — VERAPAMIL HCL 2.5 MG/ML IV SOLN
INTRAVENOUS | Status: AC
  Filled 2019-10-06: qty 2

## 2019-10-06 MED ORDER — MIDAZOLAM HCL 2 MG/2ML IJ SOLN
INTRAMUSCULAR | Status: DC | PRN
  Administered 2019-10-06: 1 mg via INTRAVENOUS

## 2019-10-06 MED ORDER — HEPARIN (PORCINE) IN NACL 1000-0.9 UT/500ML-% IV SOLN
INTRAVENOUS | Status: AC
  Filled 2019-10-06: qty 1000

## 2019-10-06 MED ORDER — FENTANYL CITRATE (PF) 100 MCG/2ML IJ SOLN
INTRAMUSCULAR | Status: DC | PRN
  Administered 2019-10-06: 25 ug via INTRAVENOUS

## 2019-10-06 MED ORDER — HEPARIN (PORCINE) IN NACL 1000-0.9 UT/500ML-% IV SOLN
INTRAVENOUS | Status: DC | PRN
  Administered 2019-10-06: 500 mL

## 2019-10-06 MED ORDER — APIXABAN 5 MG PO TABS
5.0000 mg | ORAL_TABLET | Freq: Two times a day (BID) | ORAL | Status: DC
  Administered 2019-10-07: 5 mg via ORAL
  Filled 2019-10-06: qty 1

## 2019-10-06 MED ORDER — FENTANYL CITRATE (PF) 100 MCG/2ML IJ SOLN
INTRAMUSCULAR | Status: AC
  Filled 2019-10-06: qty 2

## 2019-10-06 MED ORDER — LIDOCAINE HCL (PF) 1 % IJ SOLN
INTRAMUSCULAR | Status: DC | PRN
  Administered 2019-10-06: 2 mL
  Administered 2019-10-06: 15 mL
  Administered 2019-10-06: 1 mL

## 2019-10-06 MED ORDER — LABETALOL HCL 5 MG/ML IV SOLN: 10.0000 mg | INTRAVENOUS | Status: AC | PRN

## 2019-10-06 MED ORDER — SODIUM CHLORIDE 0.9 % IV SOLN: 250.0000 mL | INTRAVENOUS | Status: DC | PRN

## 2019-10-06 MED ORDER — HEPARIN SODIUM (PORCINE) 1000 UNIT/ML IJ SOLN
INTRAMUSCULAR | Status: AC
  Filled 2019-10-06: qty 1

## 2019-10-06 MED ORDER — MIDAZOLAM HCL 2 MG/2ML IJ SOLN
INTRAMUSCULAR | Status: AC
  Filled 2019-10-06: qty 2

## 2019-10-06 SURGICAL SUPPLY — 16 items
CATH 5FR JL3.5 JR4 ANG PIG MP (CATHETERS) ×1 IMPLANT
CATH BALLN WEDGE 5F 110CM (CATHETERS) ×1 IMPLANT
CATH INFINITI 5FR JL4 (CATHETERS) ×1 IMPLANT
DEVICE RAD COMP TR BAND LRG (VASCULAR PRODUCTS) ×1 IMPLANT
GLIDESHEATH SLEND A-KIT 6F 22G (SHEATH) ×1 IMPLANT
GUIDEWIRE ANGLED .035X150CM (WIRE) ×1 IMPLANT
GUIDEWIRE INQWIRE 1.5J.035X260 (WIRE) IMPLANT
INQWIRE 1.5J .035X260CM (WIRE) ×2
KIT HEART LEFT (KITS) ×2 IMPLANT
PACK CARDIAC CATHETERIZATION (CUSTOM PROCEDURE TRAY) ×2 IMPLANT
SHEATH GLIDE SLENDER 4/5FR (SHEATH) ×1 IMPLANT
SHEATH PINNACLE 5F 10CM (SHEATH) ×1 IMPLANT
TRANSDUCER W/STOPCOCK (MISCELLANEOUS) ×2 IMPLANT
TUBING CIL FLEX 10 FLL-RA (TUBING) ×2 IMPLANT
WIRE EMERALD 3MM-J .035X150CM (WIRE) ×1 IMPLANT
WIRE HI TORQ VERSACORE-J 145CM (WIRE) ×1 IMPLANT

## 2019-10-06 NOTE — Progress Notes (Signed)
Transfer order placed for Cardiac Telemetry (as it was the original bed placement order) as pt is going for a cardiac cath today.  Bed placement called and notified. Dr. Sharolyn Douglas notified.

## 2019-10-06 NOTE — Progress Notes (Signed)
PROGRESS NOTE  [name] [name] ONG:[id number] DOB: 10/25/1940 DOA: 10/03/2019 PCP: Casper Harrison, [name], MD  HPI/Recap of past 24 hours: HPI from Dr [name] is a 79 y.o. male with medical history significant of atrial flutter/atrial fibrillation on Eliquis, CHF, CAD, DVT, aortic valve repair, and aortic aneurysm s/p repair with stent and carotid subclavian bypass presented for left inguinal hernia repair.  However, after getting his portable chest x-ray he became acutely short of breath with wheezing. At baseline patient also reports that he is not on oxygen and has never had issues with wheezing before. He complained of feeling very sweaty and having left-sided chest pain.  Patient was noted to be hypoxic down to 88% on room air and was placed on 2 L nasal cannula oxygen with improvement in oxygenation.  Chest x-ray revealed signs concerning for interstitial edema.  Patient had fallen last month and suffered several right rib fractures. Pt had stopped taking Eliquis 2 days ago in preparation for this surgery.  Review of records notes that patient's EF was around 55% back in 2014.  Labs from 4/13 revealed hemoglobin 9.7, but appear to be near his baseline.  Due to the patient's symptoms surgery was canceled and TRH was called to admit.    Today, patient still short of breath, denies any chest pain, abdominal pain, nausea/vomiting, fever/chills.   Assessment/Plan: Principal Problem:   Chronic diastolic heart failure (HCC) Active Problems:   HTN (hypertension)   Hypochromic anemia   S/P AVR (aortic valve replacement)   Acute on chronic systolic heart failure (HCC)   Atrial fibrillation (HCC)   Chest pain   Acute on chronic respiratory failure with hypoxia (HCC)   Left inguinal hernia   Pressure injury of skin   Acute hypoxic respiratory failure likely 2/2 new onset systolic HF BNP showed 1334, elevated from previous Troponins with flat trend, EKG with no acute ST  changes Chest x-ray with pulmonary vascular congestion Echo done on 10/03/2019 showed EF of 25 to 30%, diffuse hypokinesis, bioprosthetic AVR, EF more reduced than previous of 55% Cardiology on board, status post RHC and LHC on 10/06/2019 which showed moderate pulmonary hypertension, totally occluded mid right coronary, proximal to mid circumflex stent is patent with up to 50% distal in-stent restenosis Continue Lasix, IV heparin Strict I's and O's, daily weights  Chest pain/history of CAD/history of aortic aneurysm/status post aortic valve replacement Currently chest pain-free Troponin, EKG as above Echo as above Cardiology on board, plan as above  Paroxysmal A. Fib Heart rate about 110s Continue amiodarone, metoprolol, IV heparin for now Hold home Eliquis for now  Inguinal hernia Denies any abdominal/scrotal pain Surgery with Dr. [name] postponed due to new onset HF Follow-up with general surgery  Hypertension Continue metoprolol  Iron deficiency anemia/normocytic anemia Hemoglobin with a slight downward trend No obvious signs of bleeding Anemia panel showed iron 28, sat 7, TIBC 386, ferritin 30 Start oral iron supplementation, will give him 1 dose of Feraheme prior to discharge once more stable Trend CBC  Right rib fractures after fall Imaging studies shows healing Incentive spirometry PT/OT once stable  Mass in right kidney Stable 2.7 cm heterogeneous mass arising from the upper pole of the right kidney possibly representing a hemorrhagic cyst, can also be seen with malignancy.  MRI kidney on outpatient basis may be done to rule out malignancy, ??given his anemia        Malnutrition Type:      Malnutrition Characteristics:  Nutrition Interventions:       Estimated body mass index is 25.84 kg/m as calculated from the following:   Height as of this encounter: 5\' 10"  (1.778 m).   Weight as of this encounter: 81.7 kg.     Code Status:  Full  Family Communication: Discussed extensively with wife at bedside on 10/06/2019  Disposition Plan: Patient came from home, still requires further work-up procedure and consultants sign off, pending PT/OT.    Consultants:  General surgery  Cardiology  Procedures:  RHC and LHC on 10/06/2019  Antimicrobials:  None  DVT prophylaxis: Heparin drip    Objective: Vitals:   10/06/19 1540 10/06/19 1544 10/06/19 1549 10/06/19 1554  BP: (!) 142/90 (!) 142/80 140/87 (!) 155/87  Pulse: (!) 105 100 (!) 101 100  Resp: (!) 28 19 19  (!) 34  Temp:      TempSrc:      SpO2: 100% 100% 100% 100%  Weight:      Height:        Intake/Output Summary (Last 24 hours) at 10/06/2019 1613 Last data filed at 10/06/2019 0647 Gross per 24 hour  Intake 918.06 ml  Output 1430 ml  Net -511.94 ml   Filed Weights   10/03/19 0733 10/05/19 1913 10/06/19 0431  Weight: 83.9 kg 82.4 kg 81.7 kg    Exam:  General: NAD, lethargic, deconditioned  Cardiovascular: S1, S2 present  Respiratory:  Bibasilar crackles noted  Abdomen: Soft, nontender, nondistended, bowel sounds present, scrotal swelling noted  Musculoskeletal: No bilateral pedal edema noted  Skin: Normal  Psychiatry: Normal mood    Data Reviewed: CBC: Recent Labs  Lab 09/30/19 1048 10/03/19 1617 10/05/19 0442 10/05/19 1708 10/06/19 0216  WBC 6.5 6.4 4.6 5.4 5.4  NEUTROABS  --   --  3.1  --  3.9  HGB 9.7* 9.3* 8.1* 8.8* 8.5*  HCT 34.6* 33.5* 28.5* 31.0* 30.0*  MCV 93.5 91.8 91.6 90.9 92.3  PLT 239 232 194 218 213   Basic Metabolic Panel: Recent Labs  Lab 09/30/19 1048 10/03/19 1617 10/04/19 0309 10/05/19 0442 10/06/19 0216  NA 143 142 142 141 143  K 4.6 4.3 3.9 3.7 3.5  CL 105 102 102 102 100  CO2 26 32 31 31 32  GLUCOSE 105* 115* 109* 97 94  BUN 19 19 20 21 20   CREATININE 1.02 1.04 1.07 0.99 0.98  CALCIUM 9.2 8.8* 8.7* 8.5* 8.9  MG  --   --  2.2  --   --    GFR: Estimated Creatinine Clearance: 62.1  mL/min (by C-G formula based on SCr of 0.98 mg/dL). Liver Function Tests: No results for input(s): AST, ALT, ALKPHOS, BILITOT, PROT, ALBUMIN in the last 168 hours. No results for input(s): LIPASE, AMYLASE in the last 168 hours. No results for input(s): AMMONIA in the last 168 hours. Coagulation Profile: Recent Labs  Lab 10/03/19 0741  INR 1.2   Cardiac Enzymes: No results for input(s): CKTOTAL, CKMB, CKMBINDEX, TROPONINI in the last 168 hours. BNP (last 3 results) No results for input(s): PROBNP in the last 8760 hours. HbA1C: No results for input(s): HGBA1C in the last 72 hours. CBG: No results for input(s): GLUCAP in the last 168 hours. Lipid Profile: No results for input(s): CHOL, HDL, LDLCALC, TRIG, CHOLHDL, LDLDIRECT in the last 72 hours. Thyroid Function Tests: Recent Labs    10/04/19 0309  TSH 1.342   Anemia Panel: Recent Labs    10/04/19 0309  FERRITIN 30  TIBC 386  IRON 28*  Urine analysis:    Component Value Date/Time   COLORURINE STRAW (A) 09/07/2019 1813   APPEARANCEUR CLEAR 09/07/2019 1813   LABSPEC 1.010 09/07/2019 1813   PHURINE 6.0 09/07/2019 1813   GLUCOSEU NEGATIVE 09/07/2019 1813   HGBUR LARGE (A) 09/07/2019 1813   BILIRUBINUR NEGATIVE 09/07/2019 1813   KETONESUR NEGATIVE 09/07/2019 1813   PROTEINUR NEGATIVE 09/07/2019 1813   UROBILINOGEN 1.0 10/06/2013 0000   NITRITE NEGATIVE 09/07/2019 1813   LEUKOCYTESUR NEGATIVE 09/07/2019 1813   Sepsis Labs: @LABRCNTIP (procalcitonin:4,lacticidven:4)  ) Recent Results (from the past 240 hour(s))  SARS CORONAVIRUS 2 (TAT 6-24 HRS) Nasopharyngeal Nasopharyngeal Swab     Status: None   Collection Time: 09/30/19  9:07 AM   Specimen: Nasopharyngeal Swab  Result Value Ref Range Status   SARS Coronavirus 2 NEGATIVE NEGATIVE Final    Comment: (NOTE) SARS-CoV-2 target nucleic acids are NOT DETECTED. The SARS-CoV-2 RNA is generally detectable in upper and lower respiratory specimens during the acute phase  of infection. Negative results do not preclude SARS-CoV-2 infection, do not rule out co-infections with other pathogens, and should not be used as the sole basis for treatment or other patient management decisions. Negative results must be combined with clinical observations, patient history, and epidemiological information. The expected result is Negative. Fact Sheet for Patients: 10/02/19 Fact Sheet for Healthcare Providers: HairSlick.no This test is not yet approved or cleared by the quierodirigir.com FDA and  has been authorized for detection and/or diagnosis of SARS-CoV-2 by FDA under an Emergency Use Authorization (EUA). This EUA will remain  in effect (meaning this test can be used) for the duration of the COVID-19 declaration under Section 56 4(b)(1) of the Act, 21 U.S.C. section 360bbb-3(b)(1), unless the authorization is terminated or revoked sooner. Performed at [facility], [address] [place], [place]       Studies: No results found.  Scheduled Meds: . [MAR Hold] amiodarone  200 mg Oral Daily  . [MAR Hold] aspirin EC  81 mg Oral Daily  . [MAR Hold] atorvastatin  10 mg Oral q morning - 10a  . [MAR Hold] ferrous sulfate  325 mg Oral Q breakfast  . [MAR Hold] furosemide  40 mg Intravenous BID  . [MAR Hold] loratadine  10 mg Oral Daily  . [MAR Hold] metoprolol tartrate  25 mg Oral BID  . [MAR Hold] pantoprazole  40 mg Oral Daily  . [MAR Hold] polyethylene glycol  17 g Oral Daily  . [MAR Hold] potassium chloride SA  40 mEq Oral Daily  . [MAR Hold] sodium chloride flush  3 mL Intravenous Q12H  . sodium chloride flush  3 mL Intravenous Q12H    Continuous Infusions: . [MAR Hold] sodium chloride    . sodium chloride    . sodium chloride 1 mL/kg/hr (10/06/19 1357)  . heparin 1,000 Units/hr (10/06/19 0432)     LOS: 3 days     10/08/19, MD [facility]  If 7PM-7AM,  please contact night-coverage [email or web address] 10/06/2019, 4:13 PM

## 2019-10-06 NOTE — Interval H&P Note (Signed)
Cath Lab Visit (complete for each Cath Lab visit)  Clinical Evaluation Leading to the Procedure:   ACS: No.  Non-ACS:    Anginal Classification: CCS III  Anti-ischemic medical therapy: Minimal Therapy (1 class of medications)  Non-Invasive Test Results: High-risk stress test findings: cardiac mortality >3%/year  Prior CABG: No previous CABG      History and Physical Interval Note:  10/06/2019 2:32 PM  [name] [name]  has presented today for surgery, with the diagnosis of heart failure.  The various methods of treatment have been discussed with the patient and family. After consideration of risks, benefits and other options for treatment, the patient has consented to  Procedure(s): RIGHT/LEFT HEART CATH AND CORONARY ANGIOGRAPHY (N/A) as a surgical intervention.  The patient's history has been reviewed, patient examined, no change in status, stable for surgery.  I have reviewed the patient's chart and labs.  Questions were answered to the patient's satisfaction.     Lyn Records III

## 2019-10-06 NOTE — Progress Notes (Signed)
ANTICOAGULATION CONSULT NOTE  Pharmacy Consult for heparin Indication: atrial fibrillation  Patient Measurements: Height: 5\' 10"  (177.8 cm) Weight: 82.4 kg (181 lb 10.5 oz) IBW/kg (Calculated) : 73  Vital Signs: Temp: 97.9 F (36.6 C) (04/19 0401) Temp Source: Oral (04/19 0401) BP: 130/77 (04/19 0401) Pulse Rate: 92 (04/19 0401)  Labs: Recent Labs    10/03/19 0741 10/03/19 1617 10/03/19 1617 10/03/19 1823 10/04/19 0309 10/04/19 0309 10/04/19 1002 10/05/19 0442 10/05/19 0442 10/05/19 1708 10/06/19 0216  HGB  --  9.3*   < >  --   --   --   --  8.1*   < > 8.8* 8.5*  HCT  --  33.5*   < >  --   --   --   --  28.5*  --  31.0* 30.0*  PLT  --  232   < >  --   --   --   --  194  --  218 231  APTT  --   --   --   --  73*   < > 67* 70*  --   --  64*  LABPROT 15.4*  --   --   --   --   --   --   --   --   --   --   INR 1.2  --   --   --   --   --   --   --   --   --   --   HEPARINUNFRC  --   --   --   --  1.02*  --   --  0.76*  --   --  0.54  CREATININE  --  1.04  --   --  1.07  --   --  0.99  --   --   --   TROPONINIHS  --  39*  --  36*  --   --   --   --   --   --   --    < > = values in this interval not displayed.    Estimated Creatinine Clearance: 61.4 mL/min (by C-G formula based on SCr of 0.99 mg/dL).   Assessment: 79 yo M on apixaban PTA for afib/flutter (CHADS2VASc = 4), LD 4/14 admitted for repair of L inguinal hernia on 4/16 - during pre-op eval became short of breath with acute HF  Hernia surgery cancelled and Cath planned for Monday.  Heparin level trending down from 1.02 > 0.76 >0.54, this is still elevated and not correlating with aPTT yet.  APTT level down to slightly subtherapeutic at 64 today. Hgb low but stable. Pt to cath today at 1330.  Goal of Therapy:  Heparin level 0.3 - 0.7 units/ml APTT 66 - 102 sec Monitor platelets by anticoagulation protocol: Yes   Plan:  Increase heparin gtt to 1000 units/hr Will f/u post cath  Thursday, PharmD,  BCPS Please see amion for complete clinical pharmacist phone list 10/06/2019 4:24 AM

## 2019-10-06 NOTE — Plan of Care (Signed)
  Problem: Education: Goal: Knowledge of General Education information will improve Description: Including pain rating scale, medication(s)/side effects and non-pharmacologic comfort measures 10/06/2019 0248 by Nanine Means, RN Outcome: Progressing 10/05/2019 2246 by Nanine Means, RN Outcome: Progressing 10/05/2019 2245 by Nanine Means, RN Outcome: Progressing

## 2019-10-06 NOTE — Progress Notes (Addendum)
Sheath Pull Site area- right femoral  Site Prior to Removal- 0   Pressure Applied For-  20 MInutes   Bedrest Beginning at - 1642   Manual- Yes   Patient Status During Pull- Stable    Post Pull Groin Site- 0   Post Pull Instructions Given- Yes   Post Pull Pulses Present- Yes    Dressing Applied- Tegaderm and Gauze Dressing    Comments: N/A

## 2019-10-06 NOTE — CV Procedure (Signed)
   Left and right heart cath via right radial/femoral arteries and right antecubital vein.  Real-time vascular ultrasound used to access the right radial and right femoral artery.  Right heart cath revealed moderate pulmonary hypertension, and elevated pulmonary wedge mean 32 mmHg.  Mildly elevated mean right atrial pressure, 10 mmHg.  Totally occluded mid right coronary collateralized right to right and left to right.  Widely patent left main  Moderate luminal irregularities up to 40 to 50% throughout LAD.  Proximal to mid circumflex stent is patent with up to 50% distal in-stent restenosis.  LV gram and hemodynamics across the bioprosthetic aortic valve were not assessed.

## 2019-10-06 NOTE — H&P (View-Only) (Signed)
Progress Note  Patient Name: [name] [name] Date of Encounter: 10/06/2019  Primary Cardiologist:   Lance Muss, MD   Subjective   He just got up and is in the bathroom during rounds.  He is sitting on the commode.  He is in no distress.  We discussed having coronary angiography today.  He understands that that is the plan and is willing to proceed.  Inpatient Medications    Scheduled Meds: . amiodarone  200 mg Oral Daily  . aspirin EC  81 mg Oral Daily  . atorvastatin  10 mg Oral q morning - 10a  . ferrous sulfate  325 mg Oral Q breakfast  . furosemide  40 mg Intravenous BID  . loratadine  10 mg Oral Daily  . metoprolol tartrate  25 mg Oral BID  . pantoprazole  40 mg Oral Daily  . polyethylene glycol  17 g Oral Daily  . potassium chloride SA  40 mEq Oral Daily  . sodium chloride flush  3 mL Intravenous Q12H  . sodium chloride flush  3 mL Intravenous Q12H   Continuous Infusions: . sodium chloride    . sodium chloride    . sodium chloride 1.001 mL/kg/hr (10/06/19 0334)  . heparin 1,000 Units/hr (10/06/19 0432)   PRN Meds: sodium chloride, sodium chloride, acetaminophen, methocarbamol, oxyCODONE, sodium chloride flush, sodium chloride flush   Vital Signs    Vitals:   10/05/19 2019 10/06/19 0401 10/06/19 0431 10/06/19 1036  BP: (!) 145/84 130/77  140/82  Pulse: (!) 105 92  (!) 101  Resp: 17 18    Temp: 98.1 F (36.7 C) 97.9 F (36.6 C)    TempSrc: Oral Oral    SpO2: 100% 100%    Weight:   81.7 kg   Height:        Intake/Output Summary (Last 24 hours) at 10/06/2019 1215 Last data filed at 10/06/2019 0647 Gross per 24 hour  Intake 1198.06 ml  Output 1580 ml  Net -381.94 ml   Filed Weights   10/03/19 0733 10/05/19 1913 10/06/19 0431  Weight: 83.9 kg 82.4 kg 81.7 kg    Telemetry    NSR with ectopy - Personally Reviewed  ECG    Sinus tach.  Unable to print and they cannot download.  I saw it briefly and there diffuse ST depression in the  inferior and lateral leads.   - Personally Reviewed  Physical Exam   GEN: No acute distress.   Neck: No  JVD Cardiac: RRR, no murmurs, rubs, or gallops.  Respiratory:   Decreased breath sounds.  No crackles.  GI: Soft, nontender, non-distended  MS: No edema; No deformity. Neuro:  Nonfocal  Psych: Normal affect   Labs    Chemistry Recent Labs  Lab 10/04/19 0309 10/05/19 0442 10/06/19 0216  NA 142 141 143  K 3.9 3.7 3.5  CL 102 102 100  CO2 31 31 32  GLUCOSE 109* 97 94  BUN 20 21 20   CREATININE 1.07 0.99 0.98  CALCIUM 8.7* 8.5* 8.9  GFRNONAA >60 >60 >60  GFRAA >60 >60 >60  ANIONGAP 9 8 11      Hematology Recent Labs  Lab 10/05/19 0442 10/05/19 1708 10/06/19 0216  WBC 4.6 5.4 5.4  RBC 3.11* 3.41* 3.25*  HGB 8.1* 8.8* 8.5*  HCT 28.5* 31.0* 30.0*  MCV 91.6 90.9 92.3  MCH 26.0 25.8* 26.2  MCHC 28.4* 28.4* 28.3*  RDW 16.7* 17.1* 17.0*  PLT 194 218 231    Cardiac EnzymesNo  results for input(s): TROPONINI in the last 168 hours. No results for input(s): TROPIPOC in the last 168 hours.   BNP Recent Labs  Lab 10/03/19 1048  BNP 1,334.6*     DDimer No results for input(s): DDIMER in the last 168 hours.   Radiology    No results found.  Cardiac Studies   ECHO:     1. Diffuse hypokinesis worse in the septum, apex and akinesis of inferior base . Left ventricular ejection fraction, by estimation, is 25 to 30%. The left ventricle has severely decreased function. The left ventricle demonstrates global hypokinesis. The left ventricular internal cavity size was mildly dilated. Left ventricular diastolic parameters are indeterminate. 2. Right ventricular systolic function is normal. The right ventricular size is normal. There is normal pulmonary artery systolic pressure. 3. Left atrial size was moderately dilated. 4. Right atrial size was moderately dilated. 5. The mitral valve is degenerative. Mild mitral valve regurgitation. 6. By history bioprosthetic AVR  with trivial central AR and acceptable mean gradient of 7 mmHg . The aortic valve has been repaired/replaced. Aortic valve regurgitation is trivial. No aortic stenosis is present. There is a unknown a bioprosthetic valve present in the aortic position. 7.  dilated descending thoracic aorta seen on PsL views.   Patient Profile     79 y.o. 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  Assessment & Plan    1. Acute systolic heart failure: Etiology is uncertain, hence left and right heart cath is planned today.  He will require guideline directed therapy for systolic heart failure irregardless of findings.  Already on beta-blocker therapy.  Renin/angiotensin/aldosterone blockade will be withheld in till after contrast exposure.  Perhaps LV dysfunction is rate related. 2. Paroxysmal atrial fibrillation/Atrail tach with 2:1 conduction:  Transitioned to heparin.   Rate OK.   3. Status post bioprosthetic aortic valve replacement: Remote without evidence of dysfunction on recent echo. 4. Iron deficiency anemia:nemia:  Hgb is in the 8.1-8.8 range over the last 3 determinations.   5. Precath consent achieved from the patient.The patient was counseled to undergo left heart catheterization, coronary angiography, and possible percutaneous coronary intervention with stent implantation. The procedural risks and benefits were discussed in detail. The risks discussed included death, stroke, myocardial infarction, life-threatening bleeding, limb ischemia, kidney injury, allergy, and possible emergency cardiac surgery. The risk of these significant complications were estimated to  occur less than 1% of the time. After discussion, the patient has agreed to proceed.     For questions or updates, please contact [facility] Please consult [email or web address] for contact info under Cardiology/STEMI.   Signed, Esten Dollar W Mckensey Berghuis III, MD  10/06/2019, 12:15 PM    

## 2019-10-06 NOTE — Progress Notes (Addendum)
[name] urine is slightly blood-tinged intermittently. No overt bleeding. Cath sites level 0.

## 2019-10-06 NOTE — Progress Notes (Signed)
Progress Note  Patient Name: [name] [name] Date of Encounter: 10/06/2019  Primary Cardiologist:   Lance Muss, MD   Subjective   He just got up and is in the bathroom during rounds.  He is sitting on the commode.  He is in no distress.  We discussed having coronary angiography today.  He understands that that is the plan and is willing to proceed.  Inpatient Medications    Scheduled Meds: . amiodarone  200 mg Oral Daily  . aspirin EC  81 mg Oral Daily  . atorvastatin  10 mg Oral q morning - 10a  . ferrous sulfate  325 mg Oral Q breakfast  . furosemide  40 mg Intravenous BID  . loratadine  10 mg Oral Daily  . metoprolol tartrate  25 mg Oral BID  . pantoprazole  40 mg Oral Daily  . polyethylene glycol  17 g Oral Daily  . potassium chloride SA  40 mEq Oral Daily  . sodium chloride flush  3 mL Intravenous Q12H  . sodium chloride flush  3 mL Intravenous Q12H   Continuous Infusions: . sodium chloride    . sodium chloride    . sodium chloride 1.001 mL/kg/hr (10/06/19 0334)  . heparin 1,000 Units/hr (10/06/19 0432)   PRN Meds: sodium chloride, sodium chloride, acetaminophen, methocarbamol, oxyCODONE, sodium chloride flush, sodium chloride flush   Vital Signs    Vitals:   10/05/19 2019 10/06/19 0401 10/06/19 0431 10/06/19 1036  BP: (!) 145/84 130/77  140/82  Pulse: (!) 105 92  (!) 101  Resp: 17 18    Temp: 98.1 F (36.7 C) 97.9 F (36.6 C)    TempSrc: Oral Oral    SpO2: 100% 100%    Weight:   81.7 kg   Height:        Intake/Output Summary (Last 24 hours) at 10/06/2019 1215 Last data filed at 10/06/2019 0647 Gross per 24 hour  Intake 1198.06 ml  Output 1580 ml  Net -381.94 ml   Filed Weights   10/03/19 0733 10/05/19 1913 10/06/19 0431  Weight: 83.9 kg 82.4 kg 81.7 kg    Telemetry    NSR with ectopy - Personally Reviewed  ECG    Sinus tach.  Unable to print and they cannot download.  I saw it briefly and there diffuse ST depression in the  inferior and lateral leads.   - Personally Reviewed  Physical Exam   GEN: No acute distress.   Neck: No  JVD Cardiac: RRR, no murmurs, rubs, or gallops.  Respiratory:   Decreased breath sounds.  No crackles.  GI: Soft, nontender, non-distended  MS: No edema; No deformity. Neuro:  Nonfocal  Psych: Normal affect   Labs    Chemistry Recent Labs  Lab 10/04/19 0309 10/05/19 0442 10/06/19 0216  NA 142 141 143  K 3.9 3.7 3.5  CL 102 102 100  CO2 31 31 32  GLUCOSE 109* 97 94  BUN 20 21 20   CREATININE 1.07 0.99 0.98  CALCIUM 8.7* 8.5* 8.9  GFRNONAA >60 >60 >60  GFRAA >60 >60 >60  ANIONGAP 9 8 11      Hematology Recent Labs  Lab 10/05/19 0442 10/05/19 1708 10/06/19 0216  WBC 4.6 5.4 5.4  RBC 3.11* 3.41* 3.25*  HGB 8.1* 8.8* 8.5*  HCT 28.5* 31.0* 30.0*  MCV 91.6 90.9 92.3  MCH 26.0 25.8* 26.2  MCHC 28.4* 28.4* 28.3*  RDW 16.7* 17.1* 17.0*  PLT 194 218 231    Cardiac EnzymesNo  results for input(s): TROPONINI in the last 168 hours. No results for input(s): TROPIPOC in the last 168 hours.   BNP Recent Labs  Lab 10/03/19 1048  BNP 1,334.6*     DDimer No results for input(s): DDIMER in the last 168 hours.   Radiology    No results found.  Cardiac Studies   ECHO:     1. Diffuse hypokinesis worse in the septum, apex and akinesis of inferior base . Left ventricular ejection fraction, by estimation, is 25 to 30%. The left ventricle has severely decreased function. The left ventricle demonstrates global hypokinesis. The left ventricular internal cavity size was mildly dilated. Left ventricular diastolic parameters are indeterminate. 2. Right ventricular systolic function is normal. The right ventricular size is normal. There is normal pulmonary artery systolic pressure. 3. Left atrial size was moderately dilated. 4. Right atrial size was moderately dilated. 5. The mitral valve is degenerative. Mild mitral valve regurgitation. 6. By history bioprosthetic AVR  with trivial central AR and acceptable mean gradient of 7 mmHg . The aortic valve has been repaired/replaced. Aortic valve regurgitation is trivial. No aortic stenosis is present. There is a unknown a bioprosthetic valve present in the aortic position. 7.  dilated descending thoracic aorta seen on PsL views.   Patient Profile     79 y.o. 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  Assessment & Plan    1. Acute systolic heart failure: Etiology is uncertain, hence left and right heart cath is planned today.  He will require guideline directed therapy for systolic heart failure irregardless of findings.  Already on beta-blocker therapy.  Renin/angiotensin/aldosterone blockade will be withheld in till after contrast exposure.  Perhaps LV dysfunction is rate related. 2. Paroxysmal atrial fibrillation/Atrail tach with 2:1 conduction:  Transitioned to heparin.   Rate OK.   3. Status post bioprosthetic aortic valve replacement: Remote without evidence of dysfunction on recent echo. 4. Iron deficiency anemia:nemia:  Hgb is in the 8.1-8.8 range over the last 3 determinations.   5. Precath consent achieved from the patient.The patient was counseled to undergo left heart catheterization, coronary angiography, and possible percutaneous coronary intervention with stent implantation. The procedural risks and benefits were discussed in detail. The risks discussed included death, stroke, myocardial infarction, life-threatening bleeding, limb ischemia, kidney injury, allergy, and possible emergency cardiac surgery. The risk of these significant complications were estimated to  occur less than 1% of the time. After discussion, the patient has agreed to proceed.     For questions or updates, please contact [facility] Please consult [email or web address] for contact info under Cardiology/STEMI.   Signed, Sinclair Grooms, MD  10/06/2019, 12:15 PM

## 2019-10-07 DIAGNOSIS — I484 Atypical atrial flutter: Secondary | ICD-10-CM | POA: Diagnosis not present

## 2019-10-07 DIAGNOSIS — I5032 Chronic diastolic (congestive) heart failure: Secondary | ICD-10-CM | POA: Diagnosis not present

## 2019-10-07 DIAGNOSIS — I5023 Acute on chronic systolic (congestive) heart failure: Secondary | ICD-10-CM

## 2019-10-07 DIAGNOSIS — I2510 Atherosclerotic heart disease of native coronary artery without angina pectoris: Secondary | ICD-10-CM | POA: Diagnosis not present

## 2019-10-07 DIAGNOSIS — I5022 Chronic systolic (congestive) heart failure: Secondary | ICD-10-CM | POA: Diagnosis not present

## 2019-10-07 DIAGNOSIS — Z952 Presence of prosthetic heart valve: Secondary | ICD-10-CM | POA: Diagnosis not present

## 2019-10-07 LAB — CBC WITH DIFFERENTIAL/PLATELET
Abs Immature Granulocytes: 0.03 10*3/uL (ref 0.00–0.07)
Basophils Absolute: 0 10*3/uL (ref 0.0–0.1)
Basophils Relative: 0 %
Eosinophils Absolute: 0.1 10*3/uL (ref 0.0–0.5)
Eosinophils Relative: 2 %
HCT: 29.1 % — ABNORMAL LOW (ref 39.0–52.0)
Hemoglobin: 8.3 g/dL — ABNORMAL LOW (ref 13.0–17.0)
Immature Granulocytes: 1 %
Lymphocytes Relative: 10 %
Lymphs Abs: 0.5 10*3/uL — ABNORMAL LOW (ref 0.7–4.0)
MCH: 25.8 pg — ABNORMAL LOW (ref 26.0–34.0)
MCHC: 28.5 g/dL — ABNORMAL LOW (ref 30.0–36.0)
MCV: 90.4 fL (ref 80.0–100.0)
Monocytes Absolute: 0.6 10*3/uL (ref 0.1–1.0)
Monocytes Relative: 11 %
Neutro Abs: 3.8 10*3/uL (ref 1.7–7.7)
Neutrophils Relative %: 76 %
Platelets: 213 10*3/uL (ref 150–400)
RBC: 3.22 MIL/uL — ABNORMAL LOW (ref 4.22–5.81)
RDW: 17.2 % — ABNORMAL HIGH (ref 11.5–15.5)
WBC: 5 10*3/uL (ref 4.0–10.5)
nRBC: 0 % (ref 0.0–0.2)

## 2019-10-07 LAB — BASIC METABOLIC PANEL
Anion gap: 14 (ref 5–15)
BUN: 21 mg/dL (ref 8–23)
CO2: 23 mmol/L (ref 22–32)
Calcium: 8.8 mg/dL — ABNORMAL LOW (ref 8.9–10.3)
Chloride: 105 mmol/L (ref 98–111)
Creatinine, Ser: 1.1 mg/dL (ref 0.61–1.24)
GFR calc Af Amer: >60 mL/min (ref >60)
GFR calc non Af Amer: >60 mL/min (ref >60)
Glucose, Bld: 82 mg/dL (ref 70–99)
Potassium: 3.8 mmol/L (ref 3.5–5.1)
Sodium: 142 mmol/L (ref 135–145)

## 2019-10-07 LAB — APTT: aPTT: 29 seconds (ref 24–36)

## 2019-10-07 MED ORDER — FUROSEMIDE 40 MG PO TABS: 40.0000 mg | ORAL_TABLET | Freq: Two times a day (BID) | ORAL | Status: DC

## 2019-10-07 MED ORDER — FUROSEMIDE 20 MG PO TABS: 20.0000 mg | ORAL_TABLET | ORAL | Status: DC | PRN

## 2019-10-07 MED ORDER — LOSARTAN POTASSIUM 25 MG PO TABS: 12.5000 mg | ORAL_TABLET | Freq: Every day | ORAL | Status: DC

## 2019-10-07 MED ORDER — LOSARTAN POTASSIUM 25 MG PO TABS
25.0000 mg | ORAL_TABLET | Freq: Every day | ORAL | Status: DC
  Administered 2019-10-07: 25 mg via ORAL
  Filled 2019-10-07 (×2): qty 1

## 2019-10-07 MED ORDER — SPIRONOLACTONE 25 MG PO TABS: 12.5000 mg | ORAL_TABLET | Freq: Every day | ORAL | 0 refills | Status: DC

## 2019-10-07 MED ORDER — SPIRONOLACTONE 12.5 MG HALF TABLET
12.5000 mg | ORAL_TABLET | Freq: Every day | ORAL | Status: DC
  Administered 2019-10-07: 12.5 mg via ORAL
  Filled 2019-10-07: qty 1

## 2019-10-07 MED ORDER — METOPROLOL TARTRATE 50 MG PO TABS: 50.0000 mg | ORAL_TABLET | Freq: Two times a day (BID) | ORAL | Status: DC

## 2019-10-07 MED ORDER — LOSARTAN POTASSIUM 25 MG PO TABS: 25.0000 mg | ORAL_TABLET | Freq: Every day | ORAL | 0 refills | Status: DC

## 2019-10-07 MED ORDER — METOPROLOL SUCCINATE ER 100 MG PO TB24: 100.0000 mg | ORAL_TABLET | Freq: Every day | ORAL | Status: DC

## 2019-10-07 MED ORDER — SODIUM CHLORIDE 0.9 % IV SOLN
510.0000 mg | Freq: Once | INTRAVENOUS | Status: AC
  Administered 2019-10-07: 10:00:00 510 mg via INTRAVENOUS
  Filled 2019-10-07: qty 17

## 2019-10-07 MED ORDER — FERROUS SULFATE 325 (65 FE) MG PO TABS: 325.0000 mg | ORAL_TABLET | Freq: Every day | ORAL | 0 refills | Status: DC

## 2019-10-07 MED ORDER — METOPROLOL SUCCINATE ER 50 MG PO TB24
50.0000 mg | ORAL_TABLET | Freq: Once | ORAL | Status: AC
  Administered 2019-10-07: 12:00:00 50 mg via ORAL
  Filled 2019-10-07: qty 1

## 2019-10-07 MED ORDER — METOPROLOL TARTRATE 25 MG PO TABS: 25.0000 mg | ORAL_TABLET | Freq: Once | ORAL | Status: DC

## 2019-10-07 MED ORDER — METOPROLOL SUCCINATE ER 100 MG PO TB24: 100.0000 mg | ORAL_TABLET | Freq: Every day | ORAL | 0 refills | Status: DC

## 2019-10-07 MED FILL — Heparin Sodium (Porcine) Inj 1000 Unit/ML: INTRAMUSCULAR | Qty: 10 | Status: AC

## 2019-10-07 NOTE — TOC Transition Note (Signed)
Transition of Care [facility]) - CM/SW Discharge Note   Patient Details  Name: [name] [name] MRN: [id number] Date of Birth: 09/09/1940  [facility] CM/SW Contact:  Leone Haven, RN Phone Number: 10/07/2019, 4:10 PM   Clinical Narrative:    NCM spoke with wife, she states they are active with [facility], she would like to have [facility] with them also for CHF disease management.  NCM made referral to [facility]. They confirmed patient is active with them they will need orders faxed to [phone].    Final next level of care: Home w Home Health Services Barriers to Discharge: No Barriers Identified   Patient Goals and CMS Choice Patient states their goals for this hospitalization and ongoing recovery are:: get better CMS Medicare.gov Compare Post Acute Care list provided to:: Patient Represenative (must comment) Choice offered to / list presented to : Spouse  Discharge Placement                       Discharge Plan and Services                  DME Agency: NA       HH Arranged: RN, PT, Disease Management HH Agency: [facility] Health Date [facility] Agency Contacted: 10/07/19 Time HH Agency Contacted: [phone] Representative spoke with at [facility] Agency: Rep  Social Determinants of Health (SDOH) Interventions     Readmission Risk Interventions No flowsheet data found.

## 2019-10-07 NOTE — Care Management Important Message (Signed)
Important Message  Patient Details  Name: [name] [name] MRN: [id number] Date of Birth: Apr 19, 1941   Medicare Important Message Given:  Yes     Renie Ora 10/07/2019, 10:32 AM

## 2019-10-07 NOTE — Discharge Summary (Signed)
Discharge Summary  NECO VIVERITO CBJ:[id number] DOB: 12/28/1940  PCP: Bobbye Morton, MD  Admit date: 10/03/2019 Discharge date: 10/07/2019  Time spent: 40 mins  Recommendations for Outpatient Follow-up:  1. Advised to follow-up with cardiology/EP within 1 week, for medication adjustment as needed 2. Advised to follow-up with PCP in 1 week, with repeat labs  Discharge Diagnoses:  Active Hospital Problems   Diagnosis Date Noted  . Chronic diastolic heart failure (HCC) 01/28/2015  . Pressure injury of skin 10/06/2019  . Acute on chronic respiratory failure with hypoxia (HCC) 10/03/2019  . Left inguinal hernia 10/03/2019  . Chest pain 01/28/2015  . Atrial fibrillation (HCC) 05/21/2013  . Acute on chronic systolic heart failure (HCC) 05/11/2013  . Hypochromic anemia 05/04/2013  . S/P AVR (aortic valve replacement)   . HTN (hypertension)     Resolved Hospital Problems  No resolved problems to display.    Discharge Condition: Stable  Diet recommendation: Heart healthy  Vitals:   10/07/19 1134 10/07/19 1521  BP: (!) 108/55 (!) 143/76  Pulse: 95 91  Resp: 18   Temp: 98.2 F (36.8 C)   SpO2: 96%     History of present illness:  [name] [name] a 79 y.o.malewith medical history significant ofatrial flutter/atrial fibrillationon Eliquis, CHF,CAD,DVT, aortic valve repair, and aortic aneurysms/prepair with stent and carotid subclavian bypass presented for left inguinal hernia repair. However, after getting his portable chest x-ray he became acutely short of breath with wheezing. At baseline patient also reports that he is not on oxygen and has never had issues with wheezing before. He complained of feeling very sweaty and having left-sided chest pain. Patient was noted to be hypoxic down to 88% on room air and was placed on 2 L nasal cannula oxygen with improvement in oxygenation. Chest x-ray revealed signs concerning for interstitial edema. Patient had  fallen last month and suffered several right rib fractures. Pt had stopped taking Eliquis 2 days ago in preparation for this surgery. Review of records notes that patient's EF was around 55% back in 2014. Labs from 4/13 revealed hemoglobin 9.7, but appear to be near his baseline. Due to the patient's symptoms surgery was canceled and [facility] was called to admit.    Today, patient reports feeling okay, denies any worsening shortness of breath, was able to ambulate the hallway with the nursing tech, without any significant worsening shortness of breath.  Patient denies any chest pain, abdominal pain, nausea/vomiting, fever/chills, any evidence of bleeding.  Was paged by cardiology nurse practitioner Lillia Abed on 10/07/2019, who stated patient was stable to be discharged from a cardiology standpoint and EP has evaluated patient and will arrange for outpatient procedure and follow-up.  Medication changes were made by cardiology.  Patient advised to be compliant to medication as well as compliant with follow-up appointments.  Home health PT/OT/RN arranged for patient.   Hospital Course:  Principal Problem:   Chronic diastolic heart failure (HCC) Active Problems:   HTN (hypertension)   Hypochromic anemia   S/P AVR (aortic valve replacement)   Acute on chronic systolic heart failure (HCC)   Atrial fibrillation (HCC)   Chest pain   Acute on chronic respiratory failure with hypoxia (HCC)   Left inguinal hernia   Pressure injury of skin  Acute hypoxic respiratory failure likely 2/2 new onset systolic HF BNP showed 1334, elevated from previous Troponins with flat trend, EKG with no acute ST changes Chest x-ray with pulmonary vascular congestion Echo done on 10/03/2019 showed EF of  25 to 30%, diffuse hypokinesis, bioprosthetic AVR, EF more reduced than previous of 55% Cardiology on board, status post RHC and LHC on 10/06/2019 which showed moderate pulmonary hypertension, totally occluded mid right coronary,  proximal to mid circumflex stent is patent with up to 50% distal in-stent restenosis. No stents placed. 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 Fib/flutter Heart rate with better control EP on board, continue amiodarone, Toprol, Eliquis follow-up with EP as an outpatient  Inguinal hernia Denies any abdominal/scrotal pain Surgery with Dr. Grandville Silos postponed due to new onset HF Follow-up with general surgery as an outpatient once stable  Hypertension Continue losartan Follow-up with PCP for BP monitoring as medications were newly added due to new heart failure  Iron deficiency anemia/normocytic anemia Hemoglobin with a slight downward trend No obvious signs of bleeding Anemia panel showed iron 28, sat 7, TIBC 386, ferritin 30 Status post 1 dose of Feraheme on 10/07/2019, started oral iron supplementation Follow-up with PCP  Right rib fractures after fall Imaging studies shows healing HH PT/OT  Mass in right kidney Stable 2.7 cm heterogeneous mass arising from the upper pole of the right kidney possibly representing a hemorrhagic cyst, can also be seen with malignancy.  MRI kidney on outpatient basis may be done to rule out malignancy, ??given his anemia PCP to follow-up          Malnutrition Type:      Malnutrition Characteristics:      Nutrition Interventions:      Estimated body mass index is 25.24 kg/m as calculated from the following:   Height as of this encounter: 5\' 10"  (1.778 m).   Weight as of this encounter: 79.8 kg.    Procedures:  LHC/RHC on 10/06/2019  Consultations:  Cardiology, EP  General surgery  Discharge  Exam: BP (!) 143/76 (BP Location: Left Arm)   Pulse 91   Temp 98.2 F (36.8 C) (Oral)   Resp 18   Ht 5\' 10"  (1.778 m)   Wt 79.8 kg   SpO2 96%   BMI 25.24 kg/m   General: NAD, chronically ill-appearing Cardiovascular: S1, S2 present Respiratory: Bibasilar crackles noted  Discharge Instructions You were cared for by a hospitalist during your hospital stay. If you have any questions about your discharge medications or the care you received while you were in the hospital after you are discharged, you can call the unit and asked to speak with the hospitalist on call if the hospitalist that took care of you is not available. Once you are discharged, your primary care physician will handle any further medical issues. Please note that NO REFILLS for any discharge medications will be authorized once you are discharged, as it is imperative that you return to your primary care physician (or establish a relationship with a primary care physician if you do not have one) for your aftercare needs so that they can reassess your need for medications and monitor your lab values.  Discharge Instructions    Diet - low sodium heart healthy   Complete by: As directed    Increase activity slowly   Complete by: As directed      Allergies as of 10/07/2019      Reactions   Lortab [hydrocodone-acetaminophen] Hives   No trouble breathing  Morphine And Related Hives   Other Other (See Comments)   Staples from surgery caused infection      Medication List    STOP taking these medications   metoprolol tartrate 25 MG tablet Commonly known as: LOPRESSOR     TAKE these medications   acetaminophen 500 MG tablet Commonly known as: TYLENOL Take 2 tablets (1,000 mg total) by mouth every 6 (six) hours as needed.   amiodarone 200 MG tablet Commonly known as: PACERONE Take 1 tablet (200 mg total) by mouth daily.   apixaban 5 MG Tabs tablet Commonly known as: Eliquis Take 1 tablet (5 mg total) by mouth 2  (two) times daily.   atorvastatin 10 MG tablet Commonly known as: LIPITOR Take 1 tablet by mouth once daily What changed: when to take this   ferrous sulfate 325 (65 FE) MG tablet Take 1 tablet (325 mg total) by mouth daily with breakfast. Start taking on: October 08, 2019   furosemide 40 MG tablet Commonly known as: LASIX Take 1 tablet by mouth twice daily What changed: when to take this   lansoprazole 30 MG capsule Commonly known as: PREVACID Take 30 mg by mouth daily.   loratadine 10 MG tablet Commonly known as: CLARITIN Take 1 tablet (10 mg total) by mouth daily.   losartan 25 MG tablet Commonly known as: COZAAR Take 1 tablet (25 mg total) by mouth daily. Start taking on: October 08, 2019   methocarbamol 500 MG tablet Commonly known as: ROBAXIN Take 2 tablets (1,000 mg total) by mouth every 8 (eight) hours as needed for muscle spasms. What changed: how much to take   metoprolol succinate 100 MG 24 hr tablet Commonly known as: TOPROL-XL Take 1 tablet (100 mg total) by mouth daily. Take with or immediately following a meal. Start taking on: October 08, 2019   multivitamin with minerals Tabs tablet Take 1 tablet by mouth daily.   nitroGLYCERIN 0.4 MG SL tablet Commonly known as: NITROSTAT Place 1 tablet (0.4 mg total) under the tongue every 5 (five) minutes as needed for chest pain (X3 DOSES BEFORE CALLING 911).   oxyCODONE 5 MG immediate release tablet Commonly known as: Oxy IR/ROXICODONE Take 1-2 tablets (5-10 mg total) by mouth every 6 (six) hours as needed for moderate pain.   potassium chloride SA 20 MEQ tablet Commonly known as: KLOR-CON Take 2 tablets by mouth once daily   spironolactone 25 MG tablet Commonly known as: ALDACTONE Take 0.5 tablets (12.5 mg total) by mouth daily. Start taking on: October 08, 2019      Allergies  Allergen Reactions  . Lortab [Hydrocodone-Acetaminophen] Hives    No trouble breathing  . Morphine And Related Hives  . Other  Other (See Comments)    Staples from surgery caused infection   Follow-up Information    Street, [name], MD. Schedule an appointment as soon as possible for a visit in 1 week(s).   Specialty: Family Medicine Contact information: [address] [place] [address] [phone]        [name], MD. Call in 1 week(s).   Specialties: Cardiology, Radiology, Interventional Cardiology Why: To make an appointment Contact information: [address] [place] [address] [phone]        Hillis Range, MD. Call in 1 week(s).   Specialty: Cardiology Why: To make an appointment  Contact information: [address] [place] [address] [phone]  The results of significant diagnostics from this hospitalization (including imaging, microbiology, ancillary and laboratory) are listed below for reference.    Significant Diagnostic Studies: CT ANGIO CHEST PE W OR WO CONTRAST  Result Date: 10/03/2019 CLINICAL DATA:  Shortness of breath. EXAM: CT ANGIOGRAPHY CHEST WITH CONTRAST TECHNIQUE: Multidetector CT imaging of the chest was performed using the standard protocol during bolus administration of intravenous contrast. Multiplanar CT image reconstructions and MIPs were obtained to evaluate the vascular anatomy. CONTRAST:  80mL OMNIPAQUE IOHEXOL 350 MG/ML SOLN COMPARISON:  09/04/2019. FINDINGS: Cardiovascular: The heart remains enlarged with dense coronary artery calcifications. An aortic stent is unchanged. The distal aortic arch and descending thoracic aorta remains diffusely enlarged. The distal arch measures 5.0 cm in maximum diameter without significant change since 09/04/2019. The lower lung zone pulmonary arteries are poorly opacified with poor mixing of opacified and unopacified blood. No definite pulmonary arterial filling defects are confirmed in 3 planes. The remainder of the pulmonary arteries are normally  opacified with no pulmonary arterial filling defects seen. Mediastinum/Nodes: No enlarged mediastinal, hilar, or axillary lymph nodes. Thyroid gland, trachea, and esophagus demonstrate no significant findings. Lungs/Pleura: Interval small bilateral pleural effusions. Minimal patchy ground-glass opacity is again demonstrated in both lungs, remaining greater on the right. Mild bilateral dependent atelectasis. Interval small oval area of patchy density in the periphery of the right upper lobe measuring 9.6 mm on image number 54 series 6. A previously demonstrated 9 mm solid right upper lobe nodule on image number 56 series 6 is unchanged. This not changed significantly since 02/18/2009. No new nodules are seen. Upper Abdomen: A heterogeneous mass arising from the upper pole of the right kidney has not changed significantly, measuring 2.7 cm in maximum diameter on image number 147 series 5. This was previously stable over multiple examinations other than new soft tissue density. Musculoskeletal: Multiple mildly displaced right posterior rib fractures are again demonstrated. Some of these are healing and others demonstrate no significant callus formation. Review of the MIP images confirms the above findings. IMPRESSION: 1. The lower lung zone pulmonary arteries are poorly opacified with poor mixing of opacified and unopacified blood. No definite pulmonary emboli are seen. 2. Interval small bilateral pleural effusions. 3. Interval small oval area of patchy density in the periphery of the right upper lobe. This could represent a small area of infection or inflammation. 4. Stable 9 mm solid right upper lobe nodule since 02/18/2009. The long-term stability is compatible with a benign process and this does not need follow-up. 5. Stable cardiomegaly and dense coronary artery atherosclerosis. 6. 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 7. Multiple mildly displaced right  posterior rib fractures, some of which are healing and others demonstrate no significant callus formation. 8. Stable 2.7 cm heterogeneous mass arising from the upper pole of the right kidney, possibly representing a hemorrhagic cyst as previously discussed. The current appearance can also be seen with malignancy. This could be further evaluated with pre and postcontrast magnetic resonance imaging of the kidneys on an outpatient basis when the patient is able to follow breathing instructions Electronically Signed   By: Beckie Salts M.D.   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  Widely patent left main.  Widely patent LAD with moderate luminal irregularities proximal to distal.  Widely patent stent in the proximal to mid circumflex  with up to 50% narrowing distal to the stent.  Moderate pulmonary hypertension.  Mean pulmonary artery pressure 35 mmHg.  Mean pulmonary capillary wedge pressure 32 mmHg. RECOMMENDATIONS:  Left ventricular systolic dysfunction is not on the basis of coronary disease.  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  DG CHEST PORT 1 VIEW  Result Date: 10/03/2019 CLINICAL DATA:  Preop exam. Additional history provided: Preop exam for left hernia repair, labored breathing, known right broken ribs. EXAM: PORTABLE CHEST 1 VIEW COMPARISON:  Prior chest radiograph 09/08/2019 and earlier FINDINGS: Unchanged cardiomegaly. Prior median sternotomy. Redemonstrated aortic valve replacement and aortic stent graft. Pulmonary vascular congestion with interstitial prominence. No definite right pleural effusion. A small left pleural effusion is difficult to exclude. No evidence of pneumothorax. Known recent right-sided rib fractures were better appreciated on prior chest CT 09/04/2019. IMPRESSION: Unchanged cardiomegaly with aortic valve replacement and aortic stent  graft. Pulmonary vascular congestion. Interstitial prominence has progressed as compared to prior exam 09/08/2019 and likely reflects interstitial edema. Atypical/viral pneumonia cannot be excluded. Possible trace left pleural effusion. Electronically Signed   By: Jackey Loge DO   On: 10/03/2019 09:09   DG Chest Port 1 View  Result Date: 09/08/2019 CLINICAL DATA:  Right rib fracture EXAM: PORTABLE CHEST 1 VIEW COMPARISON:  There is 3 days ago FINDINGS: New small right pleural effusion with hazy density at the right base. Cardiomegaly with aortic valve replacement and extensive aortic stenting. No pneumothorax. Hazy opacity at the peripheral and lower left chest is extrapleural fat based on prior CT. IMPRESSION: Interval small right pleural effusion.  Otherwise stable. Electronically Signed   By: Marnee Spring M.D.   On: 09/08/2019 07:30   ECHOCARDIOGRAM COMPLETE  Result Date: 10/03/2019    ECHOCARDIOGRAM REPORT   Patient Name:   [name] [name] Date of Exam: 10/03/2019 Medical Rec #:  [id number]         Height:       70.0 in Accession #:    [id number]        Weight:       185.0 lb Date of Birth:  [birth year]         BSA:          2.019 m Patient Age:    79 years          BP:           121/66 mmHg Patient Gender: M                 HR:           98 bpm. Exam Location:  Inpatient Procedure: 2D Echo, Cardiac Doppler and Color Doppler Indications:    R07.9* Chest pain, unspecified  History:        Patient has prior history of Echocardiogram examinations, most                 recent 08/23/2013. CHF, CAD, Aortic Valve Disease,                 Arrythmias:Atrial Flutter; Risk Factors:Former Smoker,                 Hypertension and Dyslipidemia. DVT. Thoracic Aortic Aneurysm.                 Aortic Valve: unknown a bioprosthetic valve is present in the                 aortic position.  Sonographer:    Elmarie Shiley Dance Referring Phys:  [id number] RONDELL A SMITH IMPRESSIONS  1. Diffuse hypokinesis worse in the septum, apex  and akinesis of inferior base . Left ventricular ejection fraction, by estimation, is 25 to 30%. The left ventricle has severely decreased function. The left ventricle demonstrates global hypokinesis. The  left ventricular internal cavity size was mildly dilated. Left ventricular diastolic parameters are indeterminate.  2. Right ventricular systolic function is normal. The right ventricular size is normal. There is normal pulmonary artery systolic pressure.  3. Left atrial size was moderately dilated.  4. Right atrial size was moderately dilated.  5. The mitral valve is degenerative. Mild mitral valve regurgitation.  6. By history bioprosthetic AVR with trivial central AR and acceptable mean gradient of 7 mmHg . The aortic valve has been repaired/replaced. Aortic valve regurgitation is trivial. No aortic stenosis is present. There is a unknown a bioprosthetic valve present in the aortic position.  7. ? dilated descending thoracic aorta seen on PsL views. FINDINGS  Left Ventricle: Diffuse hypokinesis worse in the septum, apex and akinesis of inferior base. Left ventricular ejection fraction, by estimation, is 25 to 30%. The left ventricle has severely decreased function. The left ventricle demonstrates global hypokinesis. The left ventricular internal cavity size was mildly dilated. There is no left ventricular hypertrophy. Left ventricular diastolic parameters are indeterminate. Right Ventricle: The right ventricular size is normal. Right vetricular wall thickness was not assessed. Right ventricular systolic function is normal. There is normal pulmonary artery systolic pressure. The tricuspid regurgitant velocity is 2.32 m/s, and with an assumed right atrial pressure of 3 mmHg, the estimated right ventricular systolic pressure is 24.5 mmHg. Left Atrium: Left atrial size was moderately dilated. Right Atrium: Right atrial size was moderately dilated. Pericardium: There is no evidence of pericardial effusion. Mitral  Valve: The mitral valve is degenerative in appearance. There is mild thickening of the mitral valve leaflet(s). There is mild calcification of the mitral valve leaflet(s). Moderate mitral annular calcification. Mild mitral valve regurgitation. Tricuspid Valve: The tricuspid valve is normal in structure. Tricuspid valve regurgitation is mild. Aortic Valve: By history bioprosthetic AVR with trivial central AR and acceptable mean gradient of 7 mmHg. The aortic valve has been repaired/replaced. Aortic valve regurgitation is trivial. No aortic stenosis is present. Aortic valve mean gradient measures 6.7 mmHg. Aortic valve peak gradient measures 13.7 mmHg. Aortic valve area, by VTI measures 2.05 cm. There is a unknown a bioprosthetic valve present in the aortic position. Pulmonic Valve: The pulmonic valve was normal in structure. Pulmonic valve regurgitation is mild. Aorta: ? dilated descending thoracic aorta seen on PsL views. The aortic root is normal in size and structure. IAS/Shunts: The interatrial septum was not well visualized.  LEFT VENTRICLE PLAX 2D LVIDd:         5.60 cm LVIDs:         4.30 cm LV PW:         1.40 cm LV IVS:        1.10 cm LVOT diam:     2.30 cm LV SV:         72 LV SV Index:   36 LVOT Area:     4.15 cm  RIGHT VENTRICLE             IVC RV Basal diam:  3.80 cm     IVC diam: 2.90 cm RV Mid diam:    1.90 cm RV S prime:     11.30 cm/s TAPSE (M-mode): 2.2 cm LEFT ATRIUM  Index       RIGHT ATRIUM           Index LA diam:        4.70 cm  2.33 cm/m  RA Area:     28.70 cm LA Vol (A2C):   150.0 ml 74.28 ml/m RA Volume:   93.30 ml  46.20 ml/m LA Vol (A4C):   82.7 ml  40.95 ml/m LA Biplane Vol: 120.0 ml 59.43 ml/m  AORTIC VALVE AV Area (Vmax):    2.53 cm AV Area (Vmean):   2.52 cm AV Area (VTI):     2.05 cm AV Vmax:           185.33 cm/s AV Vmean:          113.600 cm/s AV VTI:            0.353 m AV Peak Grad:      13.7 mmHg AV Mean Grad:      6.7 mmHg LVOT Vmax:         113.00 cm/s  LVOT Vmean:        69.000 cm/s LVOT VTI:          0.174 m LVOT/AV VTI ratio: 0.49  AORTA Ao Root diam: 3.90 cm Ao Asc diam:  3.20 cm MITRAL VALVE               TRICUSPID VALVE MV Area (PHT): 3.69 cm    TR Peak grad:   21.5 mmHg MV Decel Time: 206 msec    TR Vmax:        232.00 cm/s MV E velocity: 90.65 cm/s                            SHUNTS                            Systemic VTI:  0.17 m                            Systemic Diam: 2.30 cm Charlton Haws MD Electronically signed by Charlton Haws MD Signature Date/Time: 10/03/2019/3:33:27 PM    Final     Microbiology: Recent Results (from the past 240 hour(s))  SARS CORONAVIRUS 2 (TAT 6-24 HRS) Nasopharyngeal Nasopharyngeal Swab     Status: None   Collection Time: 09/30/19  9:07 AM   Specimen: Nasopharyngeal Swab  Result Value Ref Range Status   SARS Coronavirus 2 NEGATIVE NEGATIVE Final    Comment: (NOTE) SARS-CoV-2 target nucleic acids are NOT DETECTED. The SARS-CoV-2 RNA is generally detectable in upper and lower respiratory specimens during the acute phase of infection. Negative results do not preclude SARS-CoV-2 infection, do not rule out co-infections with other pathogens, and should not be used as the sole basis for treatment or other patient management decisions. Negative results must be combined with clinical observations, patient history, and epidemiological information. The expected result is Negative. Fact Sheet for Patients: HairSlick.no Fact Sheet for Healthcare Providers: quierodirigir.com This test is not yet approved or cleared by the [place] FDA and  has been authorized for detection and/or diagnosis of SARS-CoV-2 by FDA under an Emergency Use Authorization (EUA). This EUA will remain  in effect (meaning this test can be used) for the duration of the COVID-19 declaration under Section 56 4(b)(1) of the Act, 21 U.S.C. section 360bbb-3(b)(1), unless the authorization is  terminated  or revoked sooner. Performed at [facility], [address], [place], [place] [address]      Labs: Basic Metabolic Panel: Recent Labs  Lab 10/03/19 1617 10/03/19 1617 10/04/19 0309 10/04/19 0309 10/05/19 0442 10/06/19 0216 10/06/19 1509 10/06/19 1515 10/07/19 0455  NA 142   < > 142   < > 141 143 141 141 142  K 4.3   < > 3.9   < > 3.7 3.5 4.0 4.0 3.8  CL 102  --  102  --  102 100  --   --  105  CO2 32  --  31  --  31 32  --   --  23  GLUCOSE 115*  --  109*  --  97 94  --   --  82  BUN 19  --  20  --  21 20  --   --  21  CREATININE 1.04  --  1.07  --  0.99 0.98  --   --  1.10  CALCIUM 8.8*  --  8.7*  --  8.5* 8.9  --   --  8.8*  MG  --   --  2.2  --   --   --   --   --   --    < > = values in this interval not displayed.   Liver Function Tests: No results for input(s): AST, ALT, ALKPHOS, BILITOT, PROT, ALBUMIN in the last 168 hours. No results for input(s): LIPASE, AMYLASE in the last 168 hours. No results for input(s): AMMONIA in the last 168 hours. CBC: Recent Labs  Lab 10/03/19 1617 10/03/19 1617 10/05/19 0442 10/05/19 0442 10/05/19 1708 10/06/19 0216 10/06/19 1509 10/06/19 1515 10/07/19 0455  WBC 6.4  --  4.6  --  5.4 5.4  --   --  5.0  NEUTROABS  --   --  3.1  --   --  3.9  --   --  3.8  HGB 9.3*   < > 8.1*   < > 8.8* 8.5* 9.5* 9.5* 8.3*  HCT 33.5*   < > 28.5*   < > 31.0* 30.0* 28.0* 28.0* 29.1*  MCV 91.8  --  91.6  --  90.9 92.3  --   --  90.4  PLT 232  --  194  --  218 231  --   --  213   < > = values in this interval not displayed.   Cardiac Enzymes: No results for input(s): CKTOTAL, CKMB, CKMBINDEX, TROPONINI in the last 168 hours. BNP: BNP (last 3 results) Recent Labs    10/03/19 1048  BNP 1,334.6*    ProBNP (last 3 results) No results for input(s): PROBNP in the last 8760 hours.  CBG: No results for input(s): GLUCAP in the last 168 hours.     Signed:  Briant Cedar, MD [facility] 10/07/2019, 4:03  PM

## 2019-10-07 NOTE — Consult Note (Addendum)
ELECTROPHYSIOLOGY CONSULT NOTE    Patient ID: [name] [name] MRN: [id number], DOB/AGE: [birth year] 79 y.o.  Admit date: 10/03/2019 Date of Consult: 10/07/2019  Primary Physician: Street, [name], MD Primary Cardiologist: Lance Muss, MD  Electrophysiologist: New (Seen distantly by Dr. Johney Frame 08/26/2013)  Referring Provider: Dr. Katrinka Blazing  Patient Profile: [name] [name] is a 79 y.o. 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 Katrinka Blazing.  HPI:  [name] [name] is a 79 y.o. male with complicated medical history as above.  HE presented 4/16 for planned inguinal/scrotal hernia repair surgery, and during anaesthesia evaluation he became acutely SOB requiring supplemental O2. Surgery cancelled and admitted to IM. Echo showed newly reduced EF 25-30% with global HK. (Echo in 2015 with normal EF).   EKG showed ? 2:1 flutter with history of same on Eliquis. With new drop in EF and known CAD pt taken for cath 10/06/2018.  This showed CTO of mid RCA with collaterals, widely patent left main, widely patent LAD, widely patent prox to mid cx stent with up to 50% narrowing. Moderate PHTN with PAP 35 mmHg. Wedge of 32  EP asked to see for consideration of rhythm control and ? Tachy mediated cardiomyopathy with pulses in the 90-100s for past several months by Epic. Pt states he has been his USOH apart from fall last month with rib fracture. Looking back at EKGs he has had this particular flutter since at least 2019.   He denies pulmonary edema, dietary indiscretions, SOB, orthopnea, or PND.  He denies ischemic symptoms.   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 It has been uncomfortable at times due to its size; but otherwise is "incarcerated, but not strangulated"    Past Medical History:  Diagnosis Date   Aortic valve insufficiency    Arthritis    "probably in my knees before they replaced them" (01/28/2015)   Atrial flutter (HCC)    CAD (coronary artery disease)    CHF (congestive heart failure) (HCC)    Descending aortic aneurysm (HCC)    DVT (deep venous thrombosis) (HCC)    ?LLE post knee surgery   Dyspnea 09/2019   History of blood transfusion    "after one of my knee surgeries"   HTN (hypertension)    Hyperlipidemia    Thoracic aneurysm    Tobacco abuse      Surgical History:  Past Surgical History:  Procedure Laterality Date   AORTIC VALVE REPLACEMENT  02/19/2009   AORTO-FEMORAL BYPASS GRAFT  03/2010   ascending aortic and arch aneurysm repair/notes 04/09/2009   CARDIAC VALVE REPLACEMENT     CAROTID-SUBCLAVIAN BYPASS GRAFT Left 08/26/2013   Procedure: BYPASS GRAFT CAROTID-SUBCLAVIAN;  Surgeon: Nada Libman, MD;  Location: [facility] OR;  Service: Vascular;  Laterality: Left;   CATARACT EXTRACTION W/ INTRAOCULAR LENS  IMPLANT, BILATERAL Bilateral    ENDOVASCULAR STENT INSERTION N/A 08/26/2013   Procedure:  THORACIC STENT GRAFT INSERTION;  Surgeon: Nada Libman, MD;  Location: [facility] OR;  Service:  Vascular;  Laterality: N/A;   EYE SURGERY     JOINT REPLACEMENT     KNEE ARTHROSCOPY Bilateral    REPLACEMENT TOTAL KNEE Bilateral    right groin lymphocele Right 03/29/2009   RIGHT/LEFT HEART CATH AND CORONARY ANGIOGRAPHY N/A 10/06/2019   Procedure: RIGHT/LEFT HEART CATH AND CORONARY ANGIOGRAPHY;  Surgeon: Lyn Records, MD;  Location: [facility] INVASIVE CV LAB;  Service: Cardiovascular;  Laterality: N/A;   THORACENTESIS  2010 X 2   TONSILLECTOMY       Medications Prior to Admission    Medication Sig Dispense Refill Last Dose   acetaminophen (TYLENOL) 500 MG tablet Take 2 tablets (1,000 mg total) by mouth every 6 (six) hours as needed. 30 tablet 0 10/02/2019 at Unknown time   amiodarone (PACERONE) 200 MG tablet Take 1 tablet (200 mg total) by mouth daily. 30 tablet 11 10/03/2019 at 0545   apixaban (ELIQUIS) 5 MG TABS tablet Take 1 tablet (5 mg total) by mouth 2 (two) times daily. 180 tablet 1 10/01/2019 at Unknown time   atorvastatin (LIPITOR) 10 MG tablet Take 1 tablet by mouth once daily (Patient taking differently: Take 10 mg by mouth every morning. ) 90 tablet 3 10/03/2019 at 0545   furosemide (LASIX) 40 MG tablet Take 1 tablet by mouth twice daily (Patient taking differently: Take 40 mg by mouth 2 (two) times daily. ) 180 tablet 3 10/02/2019 at Unknown time   lansoprazole (PREVACID) 30 MG capsule Take 30 mg by mouth daily.   10/03/2019 at 0545   loratadine (CLARITIN) 10 MG tablet Take 1 tablet (10 mg total) by mouth daily. 30 tablet 11 10/02/2019 at Unknown time   methocarbamol (ROBAXIN) 500 MG tablet Take 2 tablets (1,000 mg total) by mouth every 8 (eight) hours as needed for muscle spasms. (Patient taking differently: Take 500 mg by mouth every 8 (eight) hours as needed for muscle spasms. ) 30 tablet 0 10/03/2019 at 0545   metoprolol tartrate (LOPRESSOR) 25 MG tablet Take 1 tablet (25 mg total) by mouth 2 (two) times daily. (Patient taking differently: Take 12.5 mg by mouth at bedtime. ) 60 tablet 11 10/02/2019 at Unknown time   Multiple Vitamin (MULTIVITAMIN WITH MINERALS) TABS tablet Take 1 tablet by mouth daily.   Past Week at Unknown time   oxyCODONE (OXY IR/ROXICODONE) 5 MG immediate release tablet Take 1-2 tablets (5-10 mg total) by mouth every 6 (six) hours as needed for moderate pain. 30 tablet 0 10/02/2019 at Unknown time   potassium chloride SA (KLOR-CON) 20 MEQ tablet Take 2 tablets by mouth once daily (Patient taking differently: Take 40 mEq by mouth daily. ) 180  tablet 3 10/02/2019 at Unknown time   nitroGLYCERIN (NITROSTAT) 0.4 MG SL tablet Place 1 tablet (0.4 mg total) under the tongue every 5 (five) minutes as needed for chest pain (X3 DOSES BEFORE CALLING 911). 25 tablet 3 Unknown at Unknown time    Inpatient Medications:   amiodarone  200 mg Oral Daily   apixaban  5 mg Oral BID   aspirin EC  81 mg Oral Daily   atorvastatin  10 mg Oral q morning - 10a   ferrous sulfate  325 mg Oral Q breakfast   furosemide  40 mg Intravenous BID   loratadine  10 mg Oral Daily   losartan  12.5 mg Oral Daily   [START ON 10/08/2019] metoprolol succinate  100 mg Oral Daily   metoprolol tartrate  25 mg Oral Once   metoprolol tartrate  50 mg Oral BID   pantoprazole  40 mg Oral Daily   polyethylene glycol  17 g Oral Daily   potassium chloride SA  40 mEq Oral Daily   sodium chloride flush  3 mL Intravenous Q12H   sodium chloride flush  3 mL Intravenous Q12H    Allergies:  Allergies  Allergen Reactions   Lortab [Hydrocodone-Acetaminophen] Hives    No trouble breathing   Morphine And Related Hives   Other Other (See Comments)    Staples from surgery caused infection    Social History   Socioeconomic History   Marital status: Married    Spouse name: Not on file   Number of children: Not on file   Years of education: Not on file   Highest education level: Not on file  Occupational History   Not on file  Tobacco Use   Smoking status: Former Smoker    Packs/day: 0.00    Types: Cigarettes    Quit date: 04/16/1974    Years since quitting: 45.5   Smokeless tobacco: Never Used  Substance and Sexual Activity   Alcohol use: No   Drug use: No   Sexual activity: Not Currently  Other Topics Concern   Not on file  Social History Narrative   Not on file   Social Determinants of Health   Financial Resource Strain:    Difficulty of Paying Living Expenses:   Food Insecurity:    Worried About Programme researcher, broadcasting/film/video in the  Last Year:    Barista in the Last Year:   Transportation Needs:    Freight forwarder (Medical):    Lack of Transportation (Non-Medical):   Physical Activity:    Days of Exercise per Week:    Minutes of Exercise per Session:   Stress:    Feeling of Stress :   Social Connections:    Frequency of Communication with Friends and Family:    Frequency of Social Gatherings with Friends and Family:    Attends Religious Services:    Active Member of Clubs or Organizations:    Attends Engineer, structural:    Marital Status:   Intimate Partner Violence:    Fear of Current or Ex-Partner:    Emotionally Abused:    Physically Abused:    Sexually Abused:      Family History  Problem Relation Age of Onset   Celiac disease Daughter    Aortic aneurysm Father    Hypertension Mother    Heart attack Neg Hx    Stroke Neg Hx      Review of Systems: All other systems reviewed and are otherwise negative except as noted above.  Physical Exam: Vitals:   10/07/19 0017 10/07/19 0342 10/07/19 0342 10/07/19 0759  BP: 132/67  112/68 (!) 145/65  Pulse: 97  78 81  Resp: Temp: 98.5 F (36.9 C)  97.6 F (36.4 C) 98.2 F (36.8 C)  TempSrc: Oral  Oral Oral  SpO2: 93%  95% 98%  Weight:  79.8 kg    Height:        GEN- The patient is well appearing, alert and oriented x 3 today.   HEENT: normocephalic, atraumatic; sclera clear, conjunctiva pink; hearing intact; oropharynx clear; neck supple Lungs- Clear to ausculation bilaterally, normal work of breathing.  No wheezes, rales, rhonchi Heart- Regular rate and rhythm, no murmurs, rubs or gallops GI- soft, non-tender, non-distended, bowel sounds present Extremities- no clubbing, cyanosis, or  edema; DP/PT/radial pulses 2+ bilaterally MS- no significant deformity or atrophy Skin- warm and dry, no rash or lesion Psych- euthymic mood, full affect Neuro- strength and sensation are intact  Labs:     Lab Results  Component Value Date   WBC 5.0 10/07/2019   HGB 8.3 (L) 10/07/2019   HCT 29.1 (L) 10/07/2019   MCV 90.4 10/07/2019   PLT 213 10/07/2019    Recent Labs  Lab 10/07/19 0455  NA 142  K 3.8  CL 105  CO2 23  BUN 21  CREATININE 1.10  CALCIUM 8.8*  GLUCOSE 82      Radiology/Studies: CT ANGIO CHEST PE W OR WO CONTRAST  Result Date: 10/03/2019 CLINICAL DATA:  Shortness of breath. EXAM: CT ANGIOGRAPHY CHEST WITH CONTRAST TECHNIQUE: Multidetector CT imaging of the chest was performed using the standard protocol during bolus administration of intravenous contrast. Multiplanar CT image reconstructions and MIPs were obtained to evaluate the vascular anatomy. CONTRAST:  80mL OMNIPAQUE IOHEXOL 350 MG/ML SOLN COMPARISON:  09/04/2019. FINDINGS: Cardiovascular: The heart remains enlarged with dense coronary artery calcifications. An aortic stent is unchanged. The distal aortic arch and descending thoracic aorta remains diffusely enlarged. The distal arch measures 5.0 cm in maximum diameter without significant change since 09/04/2019. The lower lung zone pulmonary arteries are poorly opacified with poor mixing of opacified and unopacified blood. No definite pulmonary arterial filling defects are confirmed in 3 planes. The remainder of the pulmonary arteries are normally opacified with no pulmonary arterial filling defects seen. Mediastinum/Nodes: No enlarged mediastinal, hilar, or axillary lymph nodes. Thyroid gland, trachea, and esophagus demonstrate no significant findings. Lungs/Pleura: Interval small bilateral pleural effusions. Minimal patchy ground-glass opacity is again demonstrated in both lungs, remaining greater on the right. Mild bilateral dependent atelectasis. Interval small oval area of patchy density in the periphery of the right upper lobe measuring 9.6 mm on image number 54 series 6. A previously demonstrated 9 mm solid right upper lobe nodule on image number 56 series 6 is  unchanged. This not changed significantly since 02/18/2009. No new nodules are seen. Upper Abdomen: A heterogeneous mass arising from the upper pole of the right kidney has not changed significantly, measuring 2.7 cm in maximum diameter on image number 147 series 5. This was previously stable over multiple examinations other than new soft tissue density. Musculoskeletal: Multiple mildly displaced right posterior rib fractures are again demonstrated. Some of these are healing and others demonstrate no significant callus formation. Review of the MIP images confirms the above findings. IMPRESSION: 1. The lower lung zone pulmonary arteries are poorly opacified with poor mixing of opacified and unopacified blood. No definite pulmonary emboli are seen. 2. Interval small bilateral pleural effusions. 3. Interval small oval area of patchy density in the periphery of the right upper lobe. This could represent a small area of infection or inflammation. 4. Stable 9 mm solid right upper lobe nodule since 02/18/2009. The long-term stability is compatible with a benign process and this does not need follow-up. 5. Stable cardiomegaly and dense coronary artery atherosclerosis. 6. 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 7. Multiple mildly displaced right posterior rib fractures, some of which are healing and others demonstrate no significant callus formation. 8. Stable 2.7 cm heterogeneous mass arising from the upper pole of the right kidney, possibly representing a hemorrhagic cyst as previously discussed. The current appearance can also be seen with malignancy. This could be further evaluated with pre and postcontrast magnetic resonance  imaging of the kidneys on an outpatient basis when the patient is able to follow breathing instructions Electronically Signed   By: Claudie Revering M.D.   On: 10/03/2019 15:23   CARDIAC CATHETERIZATION  Result Date: 10/06/2019  Chronic total  occlusion of the mid right coronary, collateralized right to right and left to right.  Widely patent left main.  Widely patent LAD with moderate luminal irregularities proximal to distal.  Widely patent stent in the proximal to mid circumflex with up to 50% narrowing distal to the stent.  Moderate pulmonary hypertension.  Mean pulmonary artery pressure 35 mmHg.  Mean pulmonary capillary wedge pressure 32 mmHg. RECOMMENDATIONS:  Left ventricular systolic dysfunction is not on the basis of coronary disease.  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  DG CHEST PORT 1 VIEW  Result Date: 10/03/2019 CLINICAL DATA:  Preop exam. Additional history provided: Preop exam for left hernia repair, labored breathing, known right broken ribs. EXAM: PORTABLE CHEST 1 VIEW COMPARISON:  Prior chest radiograph 09/08/2019 and earlier FINDINGS: Unchanged cardiomegaly. Prior median sternotomy. Redemonstrated aortic valve replacement and aortic stent graft. Pulmonary vascular congestion with interstitial prominence. No definite right pleural effusion. A small left pleural effusion is difficult to exclude. No evidence of pneumothorax. Known recent right-sided rib fractures were better appreciated on prior chest CT 09/04/2019. IMPRESSION: Unchanged cardiomegaly with aortic valve replacement and aortic stent graft. Pulmonary vascular congestion. Interstitial prominence has progressed as compared to prior exam 09/08/2019 and likely reflects interstitial edema. Atypical/viral pneumonia cannot be excluded. Possible trace left pleural effusion. Electronically Signed   By: Kellie Simmering DO   On: 10/03/2019 09:09   DG Chest Port 1 View  Result Date: 09/08/2019 CLINICAL DATA:  Right rib fracture EXAM: PORTABLE CHEST 1 VIEW COMPARISON:  There is 3 days ago FINDINGS: New small right pleural effusion with hazy density at the right base. Cardiomegaly with aortic valve replacement and extensive aortic stenting. No pneumothorax. Hazy opacity at  the peripheral and lower left chest is extrapleural fat based on prior CT. IMPRESSION: Interval small right pleural effusion.  Otherwise stable. Electronically Signed   By: Monte Fantasia M.D.   On: 09/08/2019 07:30   ECHOCARDIOGRAM COMPLETE  Result Date: 10/03/2019    ECHOCARDIOGRAM REPORT   Patient Name:   [name] [name] Date of Exam: 10/03/2019 Medical Rec #:  [id number]         Height:       70.0 in Accession #:    [id number]        Weight:       185.0 lb Date of Birth:  [birth year]         BSA:          2.019 m Patient Age:    60 years          BP:           121/66 mmHg Patient Gender: M                 HR:           98 bpm. Exam Location:  Inpatient Procedure: 2D Echo, Cardiac Doppler and Color Doppler Indications:    R07.9* Chest pain, unspecified  History:        Patient has prior history of Echocardiogram examinations, most                 recent 08/23/2013. CHF, CAD, Aortic Valve Disease,  Arrythmias:Atrial Flutter; Risk Factors:Former Smoker,                 Hypertension and Dyslipidemia. DVT. Thoracic Aortic Aneurysm.                 Aortic Valve: unknown a bioprosthetic valve is present in the                 aortic position.  Sonographer:    Tiffany Dance Referring Phys: [id number] RONDELL A SMITH IMPRESSIONS  1. Diffuse hypokinesis worse in the septum, apex and akinesis of inferior base . Left ventricular ejection fraction, by estimation, is 25 to 30%. The left ventricle has severely decreased function. The left ventricle demonstrates global hypokinesis. The  left ventricular internal cavity size was mildly dilated. Left ventricular diastolic parameters are indeterminate.  2. Right ventricular systolic function is normal. The right ventricular size is normal. There is normal pulmonary artery systolic pressure.  3. Left atrial size was moderately dilated.  4. Right atrial size was moderately dilated.  5. The mitral valve is degenerative. Mild mitral valve regurgitation.  6. By history  bioprosthetic AVR with trivial central AR and acceptable mean gradient of 7 mmHg . The aortic valve has been repaired/replaced. Aortic valve regurgitation is trivial. No aortic stenosis is present. There is a unknown a bioprosthetic valve present in the aortic position.  7. ? dilated descending thoracic aorta seen on PsL views. FINDINGS  Left Ventricle: Diffuse hypokinesis worse in the septum, apex and akinesis of inferior base. Left ventricular ejection fraction, by estimation, is 25 to 30%. The left ventricle has severely decreased function. The left ventricle demonstrates global hypokinesis. The left ventricular internal cavity size was mildly dilated. There is no left ventricular hypertrophy. Left ventricular diastolic parameters are indeterminate. Right Ventricle: The right ventricular size is normal. Right vetricular wall thickness was not assessed. Right ventricular systolic function is normal. There is normal pulmonary artery systolic pressure. The tricuspid regurgitant velocity is 2.32 m/s, and with an assumed right atrial pressure of 3 mmHg, the estimated right ventricular systolic pressure is 24.5 mmHg. Left Atrium: Left atrial size was moderately dilated. Right Atrium: Right atrial size was moderately dilated. Pericardium: There is no evidence of pericardial effusion. Mitral Valve: The mitral valve is degenerative in appearance. There is mild thickening of the mitral valve leaflet(s). There is mild calcification of the mitral valve leaflet(s). Moderate mitral annular calcification. Mild mitral valve regurgitation. Tricuspid Valve: The tricuspid valve is normal in structure. Tricuspid valve regurgitation is mild. Aortic Valve: By history bioprosthetic AVR with trivial central AR and acceptable mean gradient of 7 mmHg. The aortic valve has been repaired/replaced. Aortic valve regurgitation is trivial. No aortic stenosis is present. Aortic valve mean gradient measures 6.7 mmHg. Aortic valve peak gradient  measures 13.7 mmHg. Aortic valve area, by VTI measures 2.05 cm. There is a unknown a bioprosthetic valve present in the aortic position. Pulmonic Valve: The pulmonic valve was normal in structure. Pulmonic valve regurgitation is mild. Aorta: ? dilated descending thoracic aorta seen on PsL views. The aortic root is normal in size and structure. IAS/Shunts: The interatrial septum was not well visualized.  LEFT VENTRICLE PLAX 2D LVIDd:         5.60 cm LVIDs:         4.30 cm LV PW:         1.40 cm LV IVS:        1.10 cm LVOT diam:     2.30 cm  LV SV:         72 LV SV Index:   36 LVOT Area:     4.15 cm  RIGHT VENTRICLE             IVC RV Basal diam:  3.80 cm     IVC diam: 2.90 cm RV Mid diam:    1.90 cm RV S prime:     11.30 cm/s TAPSE (M-mode): 2.2 cm LEFT ATRIUM              Index       RIGHT ATRIUM           Index LA diam:        4.70 cm  2.33 cm/m  RA Area:     28.70 cm LA Vol (A2C):   150.0 ml 74.28 ml/m RA Volume:   93.30 ml  46.20 ml/m LA Vol (A4C):   82.7 ml  40.95 ml/m LA Biplane Vol: 120.0 ml 59.43 ml/m  AORTIC VALVE AV Area (Vmax):    2.53 cm AV Area (Vmean):   2.52 cm AV Area (VTI):     2.05 cm AV Vmax:           185.33 cm/s AV Vmean:          113.600 cm/s AV VTI:            0.353 m AV Peak Grad:      13.7 mmHg AV Mean Grad:      6.7 mmHg LVOT Vmax:         113.00 cm/s LVOT Vmean:        69.000 cm/s LVOT VTI:          0.174 m LVOT/AV VTI ratio: 0.49  AORTA Ao Root diam: 3.90 cm Ao Asc diam:  3.20 cm MITRAL VALVE               TRICUSPID VALVE MV Area (PHT): 3.69 cm    TR Peak grad:   21.5 mmHg MV Decel Time: 206 msec    TR Vmax:        232.00 cm/s MV E velocity: 90.65 cm/s                            SHUNTS                            Systemic VTI:  0.17 m                            Systemic Diam: 2.30 cm Charlton Haws MD Electronically signed by Charlton Haws MD Signature Date/Time: 10/03/2019/3:33:27 PM    Final     EKG: 10/04/2019 shows atypical appearing atrial flutter with ? 2:1 block at rate of  108 bpm (personally reviewed) EKG 09/04/2019 shows same flutter at 108 bpm EKG 05/29/2019 shows 2:1 flutter in 90s EKG 05/02/2018 shows 2:1 flutter at 108 bpm  TELEMETRY: 2:1 atrial flutter (personally reviewed)  Assessment/Plan: 1.  Atypical appearing Atrial Flutter On Eliquis chronically for CHA2DS2VASC of at least 6   Poor ablation candidate with atypical flutter.  Would continue amiodarone and plan to attempt DCCV once he is back on Eliquis un-interrupted. If hernia surgery needed sooner, could consider TEE/DCCV.  Agree with changing lopressor to Toprol 100 mg daily. Continue amiodarone 200 mg daily. Will discuss load with MD.  He  held Eliquis 24 hours prior to admission. He was then covered by heparin. Eliquis resumed this am.   2. Acute systolic CHF Echo this admit showed newly reduced EF 25-30% with global HK. (Echo in 2015 with normal EF) New. Out of proportion to stable CAD on cath ? If precipitated by relative tachycardia over past couple of years (by EKGs)  Atypical AFL, so poor candidate for ablation. Continue GDMT titration.  He is on low dose amiodarone, so if not increasing, could consider low dose amio with frequent monitoring. Will discuss further with Dr. Johney Frame.   3. CAD Relatively stable as above.  EF down out of proportion of cath.   4. Iron deficiency anemia Hgb chronically 8-9 range.  5. Chronically incarcerated but not strangulated left inguinal hernia surgery Surgery deferred in setting of CHF.   Would likely optimize patient from CHF standpoint and then plan outpatient DCCV once back covered with Eliquis for > 3 weeks uninterrupted.  He says he has been told his hernia can wait as it is chronic. While not particularly tachycardic at rest, his EKG is the same as far back as 04/2018. Did have some rate variation on EKG 05/2019. Last sinus EKG 2018.  For questions or updates, please contact [facility] Please consult [email or web address] for contact info under  Cardiology/STEMI.  Signed, Graciella Freer, PA-C  10/07/2019 10:18 AM  I have seen, examined the patient, and reviewed the above assessment and plan.  Changes to above are made where necessary.  On exam, iRRR.  The patient has atypical atrial flutter.  RVR may have contributed to his reduced EF.  Management of CHF has been initiated by cardiology team.  The patient feels "much better' and wishes to be discharged with outpatient management.   I think this is reasonably.  He can be discharged today with cardioversion in 3 weeks.  Follow-up in 4 weeks in the AF clinic and subsequently with Dr Elberta Fortis in the [facility] office (per patient request).  Electrophysiology team to see as needed while here. Please call with questions.   Co Sign: Hillis Range, MD 10/07/2019 8:50 PM

## 2019-10-07 NOTE — Progress Notes (Signed)
[name] wife, [name] [phone]), would like to be updated about pt's plan of care.

## 2019-10-07 NOTE — Progress Notes (Signed)
Progress Note  Patient Name: [name] [name] Date of Encounter: 10/07/2019  Primary Cardiologist: Lance Muss, MD   Subjective   He is sitting up this morning and wants to go home.  Inpatient Medications    Scheduled Meds: . amiodarone  200 mg Oral Daily  . apixaban  5 mg Oral BID  . aspirin EC  81 mg Oral Daily  . atorvastatin  10 mg Oral q morning - 10a  . ferrous sulfate  325 mg Oral Q breakfast  . furosemide  40 mg Intravenous BID  . loratadine  10 mg Oral Daily  . metoprolol tartrate  25 mg Oral BID  . pantoprazole  40 mg Oral Daily  . polyethylene glycol  17 g Oral Daily  . potassium chloride SA  40 mEq Oral Daily  . sodium chloride flush  3 mL Intravenous Q12H  . sodium chloride flush  3 mL Intravenous Q12H   Continuous Infusions: . sodium chloride    . sodium chloride    . ferumoxytol     PRN Meds: sodium chloride, sodium chloride, acetaminophen, methocarbamol, ondansetron (ZOFRAN) IV, oxyCODONE, sodium chloride flush, sodium chloride flush   Vital Signs    Vitals:   10/07/19 0017 10/07/19 0342 10/07/19 0342 10/07/19 0759  BP: 132/67  112/68 (!) 145/65  Pulse: 97  78 81  Resp: 17  17 18   Temp: 98.5 F (36.9 C)  97.6 F (36.4 C) 98.2 F (36.8 C)  TempSrc: Oral  Oral Oral  SpO2: 93%  95% 98%  Weight:  79.8 kg    Height:        Intake/Output Summary (Last 24 hours) at 10/07/2019 1001 Last data filed at 10/07/2019 0900 Gross per 24 hour  Intake 480 ml  Output 1475 ml  Net -995 ml   Last 3 Weights 10/07/2019 10/06/2019 10/05/2019  Weight (lbs) 175 lb 14.8 oz 180 lb 1.9 oz 181 lb 10.5 oz  Weight (kg) 79.8 kg 81.7 kg 82.4 kg      Telemetry    Heart rate pretty consistent at 108 bpm- Personally Reviewed  ECG    No new tracing- Personally Reviewed  Physical Exam   GEN: No acute distress.  Sitting and eating breakfast Neck: No JVD Cardiac: Irregular RR, no murmurs, rubs, or gallops.  Femoral cath site is unremarkable.  Right antecubital  cath site is unremarkable..  Right radial is unremarkable. Respiratory: Clear to auscultation bilaterally. GI: Soft, nontender, non-distended  MS: No edema; No deformity. Neuro:  Nonfocal  Psych: Normal affect   Labs    High Sensitivity Troponin:   Recent Labs  Lab 10/03/19 1617 10/03/19 1823  TROPONINIHS 39* 36*      Chemistry Recent Labs  Lab 10/05/19 0442 10/05/19 0442 10/06/19 0216 10/06/19 0216 10/06/19 1509 10/06/19 1515 10/07/19 0455  NA 141   < > 143   < > 141 141 142  K 3.7   < > 3.5   < > 4.0 4.0 3.8  CL 102  --  100  --   --   --  105  CO2 31  --  32  --   --   --  23  GLUCOSE 97  --  94  --   --   --  82  BUN 21  --  20  --   --   --  21  CREATININE 0.99  --  0.98  --   --   --  1.10  CALCIUM  8.5*  --  8.9  --   --   --  8.8*  GFRNONAA >60  --  >60  --   --   --  >60  GFRAA >60  --  >60  --   --   --  >60  ANIONGAP 8  --  11  --   --   --  14   < > = values in this interval not displayed.     Hematology Recent Labs  Lab 10/05/19 1708 10/05/19 1708 10/06/19 0216 10/06/19 0216 10/06/19 1509 10/06/19 1515 10/07/19 0455  WBC 5.4  --  5.4  --   --   --  5.0  RBC 3.41*  --  3.25*  --   --   --  3.22*  HGB 8.8*   < > 8.5*   < > 9.5* 9.5* 8.3*  HCT 31.0*   < > 30.0*   < > 28.0* 28.0* 29.1*  MCV 90.9  --  92.3  --   --   --  90.4  MCH 25.8*  --  26.2  --   --   --  25.8*  MCHC 28.4*  --  28.3*  --   --   --  28.5*  RDW 17.1*  --  17.0*  --   --   --  17.2*  PLT 218  --  231  --   --   --  213   < > = values in this interval not displayed.    BNP Recent Labs  Lab 10/03/19 1048  BNP 1,334.6*     DDimer No results for input(s): DDIMER in the last 168 hours.   Radiology    CARDIAC CATHETERIZATION  Result Date: 10/06/2019  Chronic total occlusion of the mid right coronary, collateralized right to right and left to right.  Widely patent left main.  Widely patent LAD with moderate luminal irregularities proximal to distal.  Widely patent  stent in the proximal to mid circumflex with up to 50% narrowing distal to the stent.  Moderate pulmonary hypertension.  Mean pulmonary artery pressure 35 mmHg.  Mean pulmonary capillary wedge pressure 32 mmHg. RECOMMENDATIONS:  Left ventricular systolic dysfunction is not on the basis of coronary disease.  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   Cardiac Studies   Left and right heart catheterization 10/06/2019:  Chronic total occlusion of the mid right coronary, collateralized right to right and left to right.  Widely patent left main.  Widely patent LAD with moderate luminal irregularities proximal to distal.  Widely patent stent in the proximal to mid circumflex with up to 50% narrowing distal to the stent.  Moderate pulmonary hypertension.  Mean pulmonary artery pressure 35 mmHg.  Mean pulmonary capillary wedge pressure 32 mmHg.  RECOMMENDATIONS:   Left ventricular systolic dysfunction is not on the basis of coronary disease.  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  Patient Profile     79 y.o. 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  ECG demonstrates 2-1 AV conduction with ventricular rate always in the 108 bpm range.  Totally occluded right coronary by cath but wide patency of left anterior descending,  circumflex, and left main.  Assessment & Plan    1. New onset systolic heart failure not related to CAD.  Possibly tachycardia related.  Acute episode possibly precipitated by atrial tachycardia with 2-1 block.  Plan guideline directed therapy for systolic heart failure and EP consultation to consider whether  atrial arrhythmia can be resolved by ablation or should be treated with cardioversion.  We will continue metoprolol succinate and discontinue metoprolol tartrate.  Consider adding digoxin but not a good idea with concomitant amiodarone therapy.  He also needs ARB/ACE/Arni.  Will increase losartan to 50 mg/day if tolerated by blood pressure. 2. History of atrial flutter: He has had months of heart rate at least 108 bpm.  Is our only option medical rate control?  He is on amiodarone as an outpatient for rate control I believe.  Has not been in rhythm for quite some time. 3. Anemia: This contributes to hemodynamic stress.  Would be nice if we could maintain hemoglobin greater than 9.5.      For questions or updates, please contact [place] Please consult [email or web address] for contact info under        Signed, Sinclair Grooms, MD  10/07/2019, 10:01 AM

## 2019-10-07 NOTE — Progress Notes (Signed)
Call placed to CCMD to notify of telemetry monitoring d/c.   

## 2019-10-07 NOTE — Consult Note (Signed)
   [facility] CM Inpatient Consult   10/07/2019  [name] [name] [birth year] [id number]  Patient was screened for admission with initially presented for a large left inguinal hernia repair however due to chest pain and shortness of breath with wheezing and the  surgical procedure was cancelled.  Review of medical record reveals patient in the Medicare [employer] ACO with [facility] [[facility]  Review of inpatient Transition of Care RNCM reveals patient is for home with home health.  No current needs noted at this time.  For questions, please contact:  Charlesetta Shanks, RN BSN CCM [facility]  [phone] business mobile phone Toll free office [phone]  Fax number: [phone] [email or web address]

## 2019-10-08 DIAGNOSIS — I480 Paroxysmal atrial fibrillation: Secondary | ICD-10-CM | POA: Diagnosis not present

## 2019-10-08 DIAGNOSIS — I110 Hypertensive heart disease with heart failure: Secondary | ICD-10-CM | POA: Diagnosis not present

## 2019-10-08 DIAGNOSIS — I4892 Unspecified atrial flutter: Secondary | ICD-10-CM | POA: Diagnosis not present

## 2019-10-08 DIAGNOSIS — I2510 Atherosclerotic heart disease of native coronary artery without angina pectoris: Secondary | ICD-10-CM | POA: Diagnosis not present

## 2019-10-08 DIAGNOSIS — S2241XD Multiple fractures of ribs, right side, subsequent encounter for fracture with routine healing: Secondary | ICD-10-CM | POA: Diagnosis not present

## 2019-10-08 DIAGNOSIS — I712 Thoracic aortic aneurysm, without rupture: Secondary | ICD-10-CM | POA: Diagnosis not present

## 2019-10-09 DIAGNOSIS — I4892 Unspecified atrial flutter: Secondary | ICD-10-CM | POA: Diagnosis not present

## 2019-10-09 DIAGNOSIS — I110 Hypertensive heart disease with heart failure: Secondary | ICD-10-CM | POA: Diagnosis not present

## 2019-10-09 DIAGNOSIS — I480 Paroxysmal atrial fibrillation: Secondary | ICD-10-CM | POA: Diagnosis not present

## 2019-10-09 DIAGNOSIS — S2241XD Multiple fractures of ribs, right side, subsequent encounter for fracture with routine healing: Secondary | ICD-10-CM | POA: Diagnosis not present

## 2019-10-09 DIAGNOSIS — I712 Thoracic aortic aneurysm, without rupture: Secondary | ICD-10-CM | POA: Diagnosis not present

## 2019-10-09 DIAGNOSIS — I2510 Atherosclerotic heart disease of native coronary artery without angina pectoris: Secondary | ICD-10-CM | POA: Diagnosis not present

## 2019-10-10 DIAGNOSIS — I712 Thoracic aortic aneurysm, without rupture: Secondary | ICD-10-CM | POA: Diagnosis not present

## 2019-10-10 DIAGNOSIS — I110 Hypertensive heart disease with heart failure: Secondary | ICD-10-CM | POA: Diagnosis not present

## 2019-10-10 DIAGNOSIS — I480 Paroxysmal atrial fibrillation: Secondary | ICD-10-CM | POA: Diagnosis not present

## 2019-10-10 DIAGNOSIS — I2510 Atherosclerotic heart disease of native coronary artery without angina pectoris: Secondary | ICD-10-CM | POA: Diagnosis not present

## 2019-10-10 DIAGNOSIS — S2241XD Multiple fractures of ribs, right side, subsequent encounter for fracture with routine healing: Secondary | ICD-10-CM | POA: Diagnosis not present

## 2019-10-10 DIAGNOSIS — I4892 Unspecified atrial flutter: Secondary | ICD-10-CM | POA: Diagnosis not present

## 2019-10-13 ENCOUNTER — Telehealth: Payer: Self-pay | Admitting: Interventional Cardiology

## 2019-10-13 NOTE — Telephone Encounter (Signed)
Called and spoke to the patient. He states that he has a hard time hearing and remembering things. Made him aware that his wife can come with him to the appt.

## 2019-10-13 NOTE — Telephone Encounter (Signed)
New Message    Pts wife is calling and says she needs to assist him to his appt because he is hard of hearing    Please advise

## 2019-10-15 DIAGNOSIS — I4892 Unspecified atrial flutter: Secondary | ICD-10-CM | POA: Diagnosis not present

## 2019-10-15 DIAGNOSIS — I480 Paroxysmal atrial fibrillation: Secondary | ICD-10-CM | POA: Diagnosis not present

## 2019-10-15 DIAGNOSIS — S2241XD Multiple fractures of ribs, right side, subsequent encounter for fracture with routine healing: Secondary | ICD-10-CM | POA: Diagnosis not present

## 2019-10-15 DIAGNOSIS — I712 Thoracic aortic aneurysm, without rupture: Secondary | ICD-10-CM | POA: Diagnosis not present

## 2019-10-15 DIAGNOSIS — I110 Hypertensive heart disease with heart failure: Secondary | ICD-10-CM | POA: Diagnosis not present

## 2019-10-15 DIAGNOSIS — I2510 Atherosclerotic heart disease of native coronary artery without angina pectoris: Secondary | ICD-10-CM | POA: Diagnosis not present

## 2019-10-15 NOTE — Progress Notes (Signed)
Cardiology Office Note   Date:  10/16/2019   ID:  Merek, Niu Oct 07, 1940, MRN [id number]  PCP:  Street, [name], MD    No chief complaint on file.  CAD  Wt Readings from Last 3 Encounters:  10/16/19 178 lb 3.2 oz (80.8 kg)  10/07/19 175 lb 14.8 oz (79.8 kg)  09/30/19 185 lb 4.8 oz (84.1 kg)       History of Present Illness: [name] [name] is a 79 y.o. male  with h/o aortic disection, CAD - DES to circumflex in Sept 2011. 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 He was admitted to the hospital on 10/05/13 for urosepsis and was on the ventilator for 2 days. Also has thoracentesis for pleural effusion (1200cc).   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 He had a retinal vein thrombosis apparently, per the patient's report.  In 4/21, he was admitted with heart failure. Echo showed: "Left ventricular ejection fraction, by estimation, is 25 to 30%.  The left ventricle has severely decreased function. The left ventricle  demonstrates global hypokinesis."    Cath showed: "Chronic total occlusion of the mid right coronary, collateralized right to right and left to right.  Widely patent left main.  Widely patent LAD with moderate luminal irregularities proximal to distal.  Widely patent stent in the proximal to mid circumflex with up to 50% narrowing distal to the stent.  Moderate pulmonary hypertension.  Mean pulmonary artery pressure 35 mmHg.  Mean pulmonary capillary wedge pressure 32 mmHg.  RECOMMENDATIONS:  Left ventricular systolic dysfunction is not on the basis of coronary disease.  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   Since last visit, the home health nurses noted some heart rates around 60.  He does not feel any palpitations.  He feels  that his breathing is better.  His leg swelling has decreased.  Denies : Chest pain. Dizziness. Leg edema. Nitroglycerin use. Orthopnea. Palpitations. Paroxysmal nocturnal dyspnea. Shortness of breath. Syncope.   He really wants to get his hernia fixed as soon as possible as it is quite limiting.  Past Medical History:  Diagnosis Date  . Aortic valve insufficiency   . Arthritis    "probably in my knees before they replaced them" (01/28/2015)  . Atrial flutter (HCC)   . CAD (coronary artery disease)   . CHF (congestive heart failure) (HCC)   . Descending aortic aneurysm (HCC)   . DVT (deep venous thrombosis) (HCC)    ?LLE post knee surgery  . Dyspnea 09/2019  . History of blood transfusion    "after one of my knee surgeries"  . HTN (hypertension)   . Hyperlipidemia   . Thoracic aneurysm   . Tobacco abuse     Past Surgical History:  Procedure Laterality Date  . AORTIC VALVE REPLACEMENT  02/19/2009  . AORTO-FEMORAL BYPASS GRAFT  03/2010   ascending aortic and arch aneurysm repair/notes 04/09/2009  . CARDIAC VALVE REPLACEMENT    . CAROTID-SUBCLAVIAN BYPASS GRAFT Left 08/26/2013   Procedure: BYPASS GRAFT CAROTID-SUBCLAVIAN;  Surgeon: Nada Libman, MD;  Location: [facility] OR;  Service: Vascular;  Laterality: Left;  . CATARACT EXTRACTION W/ INTRAOCULAR LENS  IMPLANT, BILATERAL Bilateral   . ENDOVASCULAR STENT INSERTION N/A 08/26/2013   Procedure:  THORACIC STENT GRAFT INSERTION;  Surgeon: Faylene Million  Janae Bridgeman, MD;  Location: [facility] OR;  Service: Vascular;  Laterality: N/A;  . EYE SURGERY    . JOINT REPLACEMENT    . KNEE ARTHROSCOPY Bilateral   . REPLACEMENT TOTAL KNEE Bilateral   . right groin lymphocele Right 03/29/2009  . RIGHT/LEFT HEART CATH AND CORONARY ANGIOGRAPHY N/A 10/06/2019   Procedure: RIGHT/LEFT HEART CATH AND CORONARY ANGIOGRAPHY;  Surgeon: Lyn Records, MD;  Location: [facility] INVASIVE CV LAB;  Service: Cardiovascular;  Laterality: N/A;  . THORACENTESIS  2010 X 2  . TONSILLECTOMY         Current Outpatient Medications  Medication Sig Dispense Refill  . acetaminophen (TYLENOL) 500 MG tablet Take 2 tablets (1,000 mg total) by mouth every 6 (six) hours as needed. 30 tablet 0  . amiodarone (PACERONE) 200 MG tablet Take 1 tablet (200 mg total) by mouth daily. 30 tablet 11  . apixaban (ELIQUIS) 5 MG TABS tablet Take 1 tablet (5 mg total) by mouth 2 (two) times daily. 180 tablet 1  . atorvastatin (LIPITOR) 10 MG tablet Take 1 tablet by mouth once daily 90 tablet 3  . ferrous sulfate 325 (65 FE) MG tablet Take 1 tablet (325 mg total) by mouth daily with breakfast. 90 tablet 3  . furosemide (LASIX) 40 MG tablet Take 1 tablet by mouth twice daily 180 tablet 3  . lansoprazole (PREVACID) 30 MG capsule Take 30 mg by mouth daily.    Marland Kitchen loratadine (CLARITIN) 10 MG tablet Take 1 tablet (10 mg total) by mouth daily. 30 tablet 11  . losartan (COZAAR) 25 MG tablet Take 1 tablet (25 mg total) by mouth daily. 30 tablet 0  . methocarbamol (ROBAXIN) 500 MG tablet Take 2 tablets (1,000 mg total) by mouth every 8 (eight) hours as needed for muscle spasms. 30 tablet 0  . metoprolol succinate (TOPROL-XL) 100 MG 24 hr tablet Take 1 tablet (100 mg total) by mouth daily. Take with or immediately following a meal. 90 tablet 3  . Multiple Vitamin (MULTIVITAMIN WITH MINERALS) TABS tablet Take 1 tablet by mouth daily.    . nitroGLYCERIN (NITROSTAT) 0.4 MG SL tablet Place 1 tablet (0.4 mg total) under the tongue every 5 (five) minutes as needed for chest pain (X3 DOSES BEFORE CALLING 911). 25 tablet 3  . oxyCODONE (OXY IR/ROXICODONE) 5 MG immediate release tablet Take 1-2 tablets (5-10 mg total) by mouth every 6 (six) hours as needed for moderate pain. 30 tablet 0  . potassium chloride SA (KLOR-CON) 20 MEQ tablet Take 2 tablets by mouth once daily 180 tablet 3  . spironolactone (ALDACTONE) 25 MG tablet Take 0.5 tablets (12.5 mg total) by mouth daily. 45 tablet 3   No current facility-administered medications  for this visit.    Allergies:   Lortab [hydrocodone-acetaminophen], Morphine and related, and Other    Social History:  The patient  reports that he quit smoking about 45 years ago. His smoking use included cigarettes. He smoked 0.00 packs per day. He has never used smokeless tobacco. He reports that he does not drink alcohol or use drugs.   Family History:  The patient's family history includes Aortic aneurysm in his father; Celiac disease in his daughter; Hypertension in his mother.    ROS:  Please see the history of present illness.   Otherwise, review of systems are positive for large hernia.   All other systems are reviewed and negative.    PHYSICAL EXAM: VS:  BP (!) 128/58   Pulse 62  Ht 5\' 10"  (1.778 m)   Wt 178 lb 3.2 oz (80.8 kg)   SpO2 98%   BMI 25.57 kg/m  , BMI Body mass index is 25.57 kg/m. GEN: Well nourished, well developed, in no acute distress  HEENT: normal  Neck: no JVD, carotid bruits, or masses Cardiac: bradycardic; no murmurs, rubs, or gallops,no edema  Respiratory:  clear to auscultation bilaterally, normal work of breathing GI: soft, nontender, nondistended, + BS MS: no deformity or atrophy  Skin: warm and dry, no rash Neuro:  Strength and sensation are intact Psych: euthymic mood, full affect   EKG:   The ekg ordered today demonstrates sinus brady, prolonged PR interval   Recent Labs: 09/04/2019: ALT 21 10/03/2019: B Natriuretic Peptide 1,334.6 10/04/2019: Magnesium 2.2; TSH 1.342 10/07/2019: BUN 21; Creatinine, Ser 1.10; Hemoglobin 8.3; Platelets 213; Potassium 3.8; Sodium 142   Lipid Panel    Component Value Date/Time   CHOL 152 10/10/2013 1220   TRIG 116 10/07/2013 0320   HDL 38 (L) 07/29/2010 0210   CHOLHDL 3.6 07/29/2010 0210   VLDL 18 07/29/2010 0210   LDLCALC  07/29/2010 0210    80        Total Cholesterol/HDL:CHD Risk Coronary Heart Disease Risk Table                     Men   Women  1/2 Average Risk   3.4   3.3  Average Risk        5.0   4.4  2 X Average Risk   9.6   7.1  3 X Average Risk  23.4   11.0        Use the calculated Patient Ratio above and the CHD Risk Table to determine the patient's CHD Risk.        ATP III CLASSIFICATION (LDL):  <100     mg/dL   Optimal  09/27/2010  mg/dL   Near or Above                    Optimal  130-159  mg/dL   Borderline  673-419  mg/dL   High  379-024     mg/dL   Very High     Other studies Reviewed: Additional studies/ records that were reviewed today with results demonstrating: Hospital records reviewed.   ASSESSMENT AND PLAN:  1.   Acute systolic heart failure: Appears euvolemic.  COntinue Lasix.  Will have him take a third dose of Lasix the day before the surgery to try to help ensure that he does not have heart failure sx the day of the operation.  He should let >097 know if he has orthopnea or other heart failure symptoms in the days leading up to his surgery.  I wonder if he would be a candidate for being admitted the day before and "tuned up" before surgery.  After surgery, may be able to back off of beta-blocker to 50 mg daily. 2.   AFlutter: atypical. Planned for cardioversion but he was in normal sinus rhythm today so no need for electrical cardioversion.  Continue amiodarone.. 3.   CAD: Patent stent in circ.  No further ischemic testing needed before surgery. 4.   S/p AVR: Needs SBE prophylaxis.    Current medicines are reviewed at length with the patient today.  The patient concerns regarding his medicines were addressed.  The following changes have been made:  No change  Labs/ tests ordered today include:   Orders  Placed This Encounter  Procedures  . EKG 12-Lead    Recommend 150 minutes/week of aerobic exercise Low fat, low carb, high fiber diet recommended  Disposition:   FU in 3 months   Signed, Larae Grooms, MD  10/16/2019 2:18 PM    [facility] [place], [place], [place]  [address] Phone: [phone]; Fax: [phone]

## 2019-10-16 ENCOUNTER — Telehealth: Payer: Self-pay

## 2019-10-16 ENCOUNTER — Other Ambulatory Visit: Payer: Self-pay

## 2019-10-16 ENCOUNTER — Encounter: Payer: Self-pay | Admitting: Interventional Cardiology

## 2019-10-16 ENCOUNTER — Ambulatory Visit (INDEPENDENT_AMBULATORY_CARE_PROVIDER_SITE_OTHER): Payer: Medicare Other | Admitting: Interventional Cardiology

## 2019-10-16 VITALS — BP 128/58 | HR 62 | Ht 70.0 in | Wt 178.2 lb

## 2019-10-16 DIAGNOSIS — W19XXXD Unspecified fall, subsequent encounter: Secondary | ICD-10-CM | POA: Diagnosis not present

## 2019-10-16 DIAGNOSIS — I480 Paroxysmal atrial fibrillation: Secondary | ICD-10-CM | POA: Diagnosis not present

## 2019-10-16 DIAGNOSIS — Z953 Presence of xenogenic heart valve: Secondary | ICD-10-CM | POA: Diagnosis not present

## 2019-10-16 DIAGNOSIS — I484 Atypical atrial flutter: Secondary | ICD-10-CM

## 2019-10-16 DIAGNOSIS — Z952 Presence of prosthetic heart valve: Secondary | ICD-10-CM | POA: Diagnosis not present

## 2019-10-16 DIAGNOSIS — Z95828 Presence of other vascular implants and grafts: Secondary | ICD-10-CM | POA: Diagnosis not present

## 2019-10-16 DIAGNOSIS — D509 Iron deficiency anemia, unspecified: Secondary | ICD-10-CM | POA: Diagnosis not present

## 2019-10-16 DIAGNOSIS — I5021 Acute systolic (congestive) heart failure: Secondary | ICD-10-CM | POA: Diagnosis not present

## 2019-10-16 DIAGNOSIS — I2510 Atherosclerotic heart disease of native coronary artery without angina pectoris: Secondary | ICD-10-CM | POA: Diagnosis not present

## 2019-10-16 DIAGNOSIS — Z79891 Long term (current) use of opiate analgesic: Secondary | ICD-10-CM | POA: Diagnosis not present

## 2019-10-16 DIAGNOSIS — K4031 Unilateral inguinal hernia, with obstruction, without gangrene, recurrent: Secondary | ICD-10-CM | POA: Diagnosis not present

## 2019-10-16 DIAGNOSIS — R2681 Unsteadiness on feet: Secondary | ICD-10-CM | POA: Diagnosis not present

## 2019-10-16 DIAGNOSIS — R2689 Other abnormalities of gait and mobility: Secondary | ICD-10-CM | POA: Diagnosis not present

## 2019-10-16 DIAGNOSIS — J9621 Acute and chronic respiratory failure with hypoxia: Secondary | ICD-10-CM | POA: Diagnosis not present

## 2019-10-16 DIAGNOSIS — Z86718 Personal history of other venous thrombosis and embolism: Secondary | ICD-10-CM | POA: Diagnosis not present

## 2019-10-16 DIAGNOSIS — I4892 Unspecified atrial flutter: Secondary | ICD-10-CM | POA: Diagnosis not present

## 2019-10-16 DIAGNOSIS — I5043 Acute on chronic combined systolic (congestive) and diastolic (congestive) heart failure: Secondary | ICD-10-CM | POA: Diagnosis not present

## 2019-10-16 DIAGNOSIS — Z9181 History of falling: Secondary | ICD-10-CM | POA: Diagnosis not present

## 2019-10-16 DIAGNOSIS — E785 Hyperlipidemia, unspecified: Secondary | ICD-10-CM | POA: Diagnosis not present

## 2019-10-16 DIAGNOSIS — L89151 Pressure ulcer of sacral region, stage 1: Secondary | ICD-10-CM | POA: Diagnosis not present

## 2019-10-16 DIAGNOSIS — N2889 Other specified disorders of kidney and ureter: Secondary | ICD-10-CM | POA: Diagnosis not present

## 2019-10-16 DIAGNOSIS — Z79899 Other long term (current) drug therapy: Secondary | ICD-10-CM | POA: Diagnosis not present

## 2019-10-16 DIAGNOSIS — S2241XD Multiple fractures of ribs, right side, subsequent encounter for fracture with routine healing: Secondary | ICD-10-CM | POA: Diagnosis not present

## 2019-10-16 DIAGNOSIS — Z7902 Long term (current) use of antithrombotics/antiplatelets: Secondary | ICD-10-CM | POA: Diagnosis not present

## 2019-10-16 DIAGNOSIS — Z955 Presence of coronary angioplasty implant and graft: Secondary | ICD-10-CM | POA: Diagnosis not present

## 2019-10-16 DIAGNOSIS — I712 Thoracic aortic aneurysm, without rupture: Secondary | ICD-10-CM | POA: Diagnosis not present

## 2019-10-16 DIAGNOSIS — I272 Pulmonary hypertension, unspecified: Secondary | ICD-10-CM | POA: Diagnosis not present

## 2019-10-16 DIAGNOSIS — I110 Hypertensive heart disease with heart failure: Secondary | ICD-10-CM | POA: Diagnosis not present

## 2019-10-16 MED ORDER — METOPROLOL SUCCINATE ER 100 MG PO TB24: 100.0000 mg | ORAL_TABLET | Freq: Every day | ORAL | 3 refills | Status: DC

## 2019-10-16 MED ORDER — SPIRONOLACTONE 25 MG PO TABS: 12.5000 mg | ORAL_TABLET | Freq: Every day | ORAL | 3 refills | Status: DC

## 2019-10-16 MED ORDER — FERROUS SULFATE 325 (65 FE) MG PO TABS: 325.0000 mg | ORAL_TABLET | Freq: Every day | ORAL | 3 refills | Status: DC

## 2019-10-16 NOTE — Patient Instructions (Signed)
Medication Instructions:  Your physician recommends that you continue on your current medications as directed. Please refer to the Current Medication list given to you today.   *If you need a refill on your cardiac medications before your next appointment, please call your pharmacy*   Lab Work: None ordered  If you have labs (blood work) drawn today and your tests are completely normal, you will receive your results only by: Marland Kitchen MyChart Message (if you have MyChart) OR . A paper copy in the mail If you have any lab test that is abnormal or we need to change your treatment, we will call you to review the results.   Testing/Procedures: None ordered   Follow-Up: Follow up with [name] on 01/15/20 at 1:40 PM  Other Instructions  Take 3 of your furosemide tablets the day before your surgery

## 2019-10-16 NOTE — Telephone Encounter (Signed)
I spoke to the patient and his wife who said that Dr Eldridge Dace saw the patient today and was told there was no indication/need for Cardioversion.  I told them that I would forward to Dr [employer] nurse.

## 2019-10-21 DIAGNOSIS — I272 Pulmonary hypertension, unspecified: Secondary | ICD-10-CM | POA: Diagnosis not present

## 2019-10-21 DIAGNOSIS — I4892 Unspecified atrial flutter: Secondary | ICD-10-CM | POA: Diagnosis not present

## 2019-10-21 DIAGNOSIS — I110 Hypertensive heart disease with heart failure: Secondary | ICD-10-CM | POA: Diagnosis not present

## 2019-10-21 DIAGNOSIS — J9621 Acute and chronic respiratory failure with hypoxia: Secondary | ICD-10-CM | POA: Diagnosis not present

## 2019-10-21 DIAGNOSIS — I480 Paroxysmal atrial fibrillation: Secondary | ICD-10-CM | POA: Diagnosis not present

## 2019-10-21 DIAGNOSIS — I5043 Acute on chronic combined systolic (congestive) and diastolic (congestive) heart failure: Secondary | ICD-10-CM | POA: Diagnosis not present

## 2019-10-23 DIAGNOSIS — I5022 Chronic systolic (congestive) heart failure: Secondary | ICD-10-CM | POA: Diagnosis not present

## 2019-10-23 DIAGNOSIS — I4892 Unspecified atrial flutter: Secondary | ICD-10-CM | POA: Diagnosis not present

## 2019-10-23 DIAGNOSIS — I4891 Unspecified atrial fibrillation: Secondary | ICD-10-CM | POA: Diagnosis not present

## 2019-10-23 DIAGNOSIS — D6869 Other thrombophilia: Secondary | ICD-10-CM | POA: Diagnosis not present

## 2019-10-24 DIAGNOSIS — I4892 Unspecified atrial flutter: Secondary | ICD-10-CM | POA: Diagnosis not present

## 2019-10-24 DIAGNOSIS — I272 Pulmonary hypertension, unspecified: Secondary | ICD-10-CM | POA: Diagnosis not present

## 2019-10-24 DIAGNOSIS — J9621 Acute and chronic respiratory failure with hypoxia: Secondary | ICD-10-CM | POA: Diagnosis not present

## 2019-10-24 DIAGNOSIS — I110 Hypertensive heart disease with heart failure: Secondary | ICD-10-CM | POA: Diagnosis not present

## 2019-10-24 DIAGNOSIS — I5043 Acute on chronic combined systolic (congestive) and diastolic (congestive) heart failure: Secondary | ICD-10-CM | POA: Diagnosis not present

## 2019-10-24 DIAGNOSIS — I480 Paroxysmal atrial fibrillation: Secondary | ICD-10-CM | POA: Diagnosis not present

## 2019-10-28 DIAGNOSIS — I4892 Unspecified atrial flutter: Secondary | ICD-10-CM | POA: Diagnosis not present

## 2019-10-28 DIAGNOSIS — I272 Pulmonary hypertension, unspecified: Secondary | ICD-10-CM | POA: Diagnosis not present

## 2019-10-28 DIAGNOSIS — I480 Paroxysmal atrial fibrillation: Secondary | ICD-10-CM | POA: Diagnosis not present

## 2019-10-28 DIAGNOSIS — J9621 Acute and chronic respiratory failure with hypoxia: Secondary | ICD-10-CM | POA: Diagnosis not present

## 2019-10-28 DIAGNOSIS — I110 Hypertensive heart disease with heart failure: Secondary | ICD-10-CM | POA: Diagnosis not present

## 2019-10-28 DIAGNOSIS — I5043 Acute on chronic combined systolic (congestive) and diastolic (congestive) heart failure: Secondary | ICD-10-CM | POA: Diagnosis not present

## 2019-10-29 DIAGNOSIS — K4030 Unilateral inguinal hernia, with obstruction, without gangrene, not specified as recurrent: Secondary | ICD-10-CM | POA: Diagnosis not present

## 2019-10-30 DIAGNOSIS — I272 Pulmonary hypertension, unspecified: Secondary | ICD-10-CM | POA: Diagnosis not present

## 2019-10-30 DIAGNOSIS — I480 Paroxysmal atrial fibrillation: Secondary | ICD-10-CM | POA: Diagnosis not present

## 2019-10-30 DIAGNOSIS — I4892 Unspecified atrial flutter: Secondary | ICD-10-CM | POA: Diagnosis not present

## 2019-10-30 DIAGNOSIS — I5043 Acute on chronic combined systolic (congestive) and diastolic (congestive) heart failure: Secondary | ICD-10-CM | POA: Diagnosis not present

## 2019-10-30 DIAGNOSIS — J9621 Acute and chronic respiratory failure with hypoxia: Secondary | ICD-10-CM | POA: Diagnosis not present

## 2019-10-30 DIAGNOSIS — I110 Hypertensive heart disease with heart failure: Secondary | ICD-10-CM | POA: Diagnosis not present

## 2019-11-03 ENCOUNTER — Other Ambulatory Visit: Payer: Self-pay

## 2019-11-03 MED ORDER — LOSARTAN POTASSIUM 25 MG PO TABS: 25.0000 mg | ORAL_TABLET | Freq: Every day | ORAL | 3 refills | Status: DC

## 2019-11-03 NOTE — Telephone Encounter (Signed)
[name] medication was sent to pt's pharmacy as requested. Confirmation received.  °

## 2019-11-04 DIAGNOSIS — J9621 Acute and chronic respiratory failure with hypoxia: Secondary | ICD-10-CM | POA: Diagnosis not present

## 2019-11-04 DIAGNOSIS — I480 Paroxysmal atrial fibrillation: Secondary | ICD-10-CM | POA: Diagnosis not present

## 2019-11-04 DIAGNOSIS — I272 Pulmonary hypertension, unspecified: Secondary | ICD-10-CM | POA: Diagnosis not present

## 2019-11-04 DIAGNOSIS — I4892 Unspecified atrial flutter: Secondary | ICD-10-CM | POA: Diagnosis not present

## 2019-11-04 DIAGNOSIS — I5043 Acute on chronic combined systolic (congestive) and diastolic (congestive) heart failure: Secondary | ICD-10-CM | POA: Diagnosis not present

## 2019-11-04 DIAGNOSIS — I110 Hypertensive heart disease with heart failure: Secondary | ICD-10-CM | POA: Diagnosis not present

## 2019-11-06 ENCOUNTER — Other Ambulatory Visit: Payer: Medicare Other

## 2019-11-06 ENCOUNTER — Ambulatory Visit: Payer: Medicare Other | Admitting: Cardiothoracic Surgery

## 2019-11-06 DIAGNOSIS — I4892 Unspecified atrial flutter: Secondary | ICD-10-CM | POA: Diagnosis not present

## 2019-11-06 DIAGNOSIS — I272 Pulmonary hypertension, unspecified: Secondary | ICD-10-CM | POA: Diagnosis not present

## 2019-11-06 DIAGNOSIS — I480 Paroxysmal atrial fibrillation: Secondary | ICD-10-CM | POA: Diagnosis not present

## 2019-11-06 DIAGNOSIS — I5043 Acute on chronic combined systolic (congestive) and diastolic (congestive) heart failure: Secondary | ICD-10-CM | POA: Diagnosis not present

## 2019-11-06 DIAGNOSIS — J9621 Acute and chronic respiratory failure with hypoxia: Secondary | ICD-10-CM | POA: Diagnosis not present

## 2019-11-06 DIAGNOSIS — I110 Hypertensive heart disease with heart failure: Secondary | ICD-10-CM | POA: Diagnosis not present

## 2019-11-07 ENCOUNTER — Ambulatory Visit: Payer: Self-pay | Admitting: General Surgery

## 2019-11-10 DIAGNOSIS — I5043 Acute on chronic combined systolic (congestive) and diastolic (congestive) heart failure: Secondary | ICD-10-CM | POA: Diagnosis not present

## 2019-11-10 DIAGNOSIS — I480 Paroxysmal atrial fibrillation: Secondary | ICD-10-CM | POA: Diagnosis not present

## 2019-11-10 DIAGNOSIS — J9621 Acute and chronic respiratory failure with hypoxia: Secondary | ICD-10-CM | POA: Diagnosis not present

## 2019-11-10 DIAGNOSIS — I4892 Unspecified atrial flutter: Secondary | ICD-10-CM | POA: Diagnosis not present

## 2019-11-10 DIAGNOSIS — I110 Hypertensive heart disease with heart failure: Secondary | ICD-10-CM | POA: Diagnosis not present

## 2019-11-10 DIAGNOSIS — I272 Pulmonary hypertension, unspecified: Secondary | ICD-10-CM | POA: Diagnosis not present

## 2019-11-11 DIAGNOSIS — I4892 Unspecified atrial flutter: Secondary | ICD-10-CM | POA: Diagnosis not present

## 2019-11-11 DIAGNOSIS — I480 Paroxysmal atrial fibrillation: Secondary | ICD-10-CM | POA: Diagnosis not present

## 2019-11-11 DIAGNOSIS — I110 Hypertensive heart disease with heart failure: Secondary | ICD-10-CM | POA: Diagnosis not present

## 2019-11-11 DIAGNOSIS — J9621 Acute and chronic respiratory failure with hypoxia: Secondary | ICD-10-CM | POA: Diagnosis not present

## 2019-11-11 DIAGNOSIS — I272 Pulmonary hypertension, unspecified: Secondary | ICD-10-CM | POA: Diagnosis not present

## 2019-11-11 DIAGNOSIS — I5043 Acute on chronic combined systolic (congestive) and diastolic (congestive) heart failure: Secondary | ICD-10-CM | POA: Diagnosis not present

## 2019-11-28 ENCOUNTER — Other Ambulatory Visit (HOSPITAL_COMMUNITY): Payer: Medicare Other

## 2019-11-28 ENCOUNTER — Other Ambulatory Visit (HOSPITAL_COMMUNITY)
Admission: RE | Admit: 2019-11-28 | Discharge: 2019-11-28 | Disposition: A | Discharge status: Home / Self Care | Payer: Medicare Other | Source: Ambulatory Visit | Attending: General Surgery | Admitting: General Surgery

## 2019-11-28 DIAGNOSIS — Z01812 Encounter for preprocedural laboratory examination: Secondary | ICD-10-CM | POA: Insufficient documentation

## 2019-11-28 DIAGNOSIS — Z20822 Contact with and (suspected) exposure to covid-19: Secondary | ICD-10-CM | POA: Insufficient documentation

## 2019-11-28 LAB — SARS CORONAVIRUS 2 (TAT 6-24 HRS): SARS Coronavirus 2: NEGATIVE

## 2019-12-01 ENCOUNTER — Inpatient Hospital Stay (HOSPITAL_COMMUNITY)
Admission: RE | Admit: 2019-12-01 | Discharge: 2019-12-03 | DRG: 351 | Disposition: A | Discharge status: Home / Self Care | Payer: Medicare Other | Attending: General Surgery | Admitting: General Surgery

## 2019-12-01 ENCOUNTER — Inpatient Hospital Stay: Admission: AD | Admit: 2019-12-01 | Payer: Medicare Other | Source: Ambulatory Visit | Admitting: General Surgery

## 2019-12-01 ENCOUNTER — Encounter (HOSPITAL_COMMUNITY): Payer: Self-pay | Admitting: General Surgery

## 2019-12-01 ENCOUNTER — Other Ambulatory Visit: Payer: Self-pay | Admitting: General Surgery

## 2019-12-01 DIAGNOSIS — Z885 Allergy status to narcotic agent status: Secondary | ICD-10-CM | POA: Diagnosis not present

## 2019-12-01 DIAGNOSIS — I272 Pulmonary hypertension, unspecified: Secondary | ICD-10-CM | POA: Diagnosis present

## 2019-12-01 DIAGNOSIS — Z96653 Presence of artificial knee joint, bilateral: Secondary | ICD-10-CM | POA: Diagnosis present

## 2019-12-01 DIAGNOSIS — H5461 Unqualified visual loss, right eye, normal vision left eye: Secondary | ICD-10-CM | POA: Diagnosis present

## 2019-12-01 DIAGNOSIS — I5042 Chronic combined systolic (congestive) and diastolic (congestive) heart failure: Secondary | ICD-10-CM

## 2019-12-01 DIAGNOSIS — I428 Other cardiomyopathies: Secondary | ICD-10-CM | POA: Diagnosis present

## 2019-12-01 DIAGNOSIS — Z86718 Personal history of other venous thrombosis and embolism: Secondary | ICD-10-CM

## 2019-12-01 DIAGNOSIS — I4891 Unspecified atrial fibrillation: Secondary | ICD-10-CM | POA: Diagnosis present

## 2019-12-01 DIAGNOSIS — F17210 Nicotine dependence, cigarettes, uncomplicated: Secondary | ICD-10-CM | POA: Diagnosis present

## 2019-12-01 DIAGNOSIS — I110 Hypertensive heart disease with heart failure: Secondary | ICD-10-CM | POA: Diagnosis present

## 2019-12-01 DIAGNOSIS — Z20822 Contact with and (suspected) exposure to covid-19: Secondary | ICD-10-CM | POA: Diagnosis present

## 2019-12-01 DIAGNOSIS — Z955 Presence of coronary angioplasty implant and graft: Secondary | ICD-10-CM | POA: Diagnosis not present

## 2019-12-01 DIAGNOSIS — Z7901 Long term (current) use of anticoagulants: Secondary | ICD-10-CM

## 2019-12-01 DIAGNOSIS — I480 Paroxysmal atrial fibrillation: Secondary | ICD-10-CM | POA: Diagnosis not present

## 2019-12-01 DIAGNOSIS — K4030 Unilateral inguinal hernia, with obstruction, without gangrene, not specified as recurrent: Principal | ICD-10-CM | POA: Diagnosis present

## 2019-12-01 DIAGNOSIS — Z952 Presence of prosthetic heart valve: Secondary | ICD-10-CM

## 2019-12-01 DIAGNOSIS — Z79899 Other long term (current) drug therapy: Secondary | ICD-10-CM | POA: Diagnosis not present

## 2019-12-01 DIAGNOSIS — E785 Hyperlipidemia, unspecified: Secondary | ICD-10-CM | POA: Diagnosis present

## 2019-12-01 DIAGNOSIS — I739 Peripheral vascular disease, unspecified: Secondary | ICD-10-CM | POA: Diagnosis not present

## 2019-12-01 DIAGNOSIS — I5022 Chronic systolic (congestive) heart failure: Secondary | ICD-10-CM | POA: Diagnosis present

## 2019-12-01 DIAGNOSIS — E876 Hypokalemia: Secondary | ICD-10-CM | POA: Diagnosis not present

## 2019-12-01 DIAGNOSIS — D649 Anemia, unspecified: Secondary | ICD-10-CM | POA: Diagnosis present

## 2019-12-01 DIAGNOSIS — I2510 Atherosclerotic heart disease of native coronary artery without angina pectoris: Secondary | ICD-10-CM | POA: Diagnosis present

## 2019-12-01 DIAGNOSIS — I471 Supraventricular tachycardia: Secondary | ICD-10-CM | POA: Diagnosis not present

## 2019-12-01 DIAGNOSIS — J90 Pleural effusion, not elsewhere classified: Secondary | ICD-10-CM

## 2019-12-01 HISTORY — DX: Unilateral inguinal hernia, with obstruction, without gangrene, not specified as recurrent: K40.30

## 2019-12-01 LAB — SARS CORONAVIRUS 2 (TAT 6-24 HRS): SARS Coronavirus 2: NEGATIVE

## 2019-12-01 MED ORDER — DOCUSATE SODIUM 100 MG PO CAPS
100.0000 mg | ORAL_CAPSULE | Freq: Two times a day (BID) | ORAL | Status: DC
  Administered 2019-12-01 – 2019-12-03 (×4): 100 mg via ORAL
  Filled 2019-12-01 (×4): qty 1

## 2019-12-01 MED ORDER — FUROSEMIDE 10 MG/ML IJ SOLN: 10.0000 mg | Freq: Two times a day (BID) | INTRAMUSCULAR | Status: DC

## 2019-12-01 MED ORDER — METHOCARBAMOL 500 MG PO TABS: 1000.0000 mg | ORAL_TABLET | Freq: Three times a day (TID) | ORAL | Status: DC | PRN

## 2019-12-01 MED ORDER — METOPROLOL SUCCINATE ER 100 MG PO TB24
100.0000 mg | ORAL_TABLET | Freq: Every day | ORAL | Status: DC
  Administered 2019-12-02: 100 mg via ORAL
  Filled 2019-12-01 (×2): qty 1

## 2019-12-01 MED ORDER — FUROSEMIDE 10 MG/ML IJ SOLN
40.0000 mg | Freq: Two times a day (BID) | INTRAMUSCULAR | Status: AC
  Administered 2019-12-01 – 2019-12-02 (×2): 40 mg via INTRAVENOUS
  Filled 2019-12-01 (×2): qty 4

## 2019-12-01 MED ORDER — POTASSIUM CHLORIDE CRYS ER 20 MEQ PO TBCR
40.0000 meq | EXTENDED_RELEASE_TABLET | Freq: Every day | ORAL | Status: DC
  Administered 2019-12-02 – 2019-12-03 (×2): 40 meq via ORAL
  Filled 2019-12-01 (×2): qty 2

## 2019-12-01 MED ORDER — ACETAMINOPHEN 500 MG PO TABS: 1000.0000 mg | ORAL_TABLET | Freq: Four times a day (QID) | ORAL | Status: DC | PRN

## 2019-12-01 MED ORDER — ATORVASTATIN CALCIUM 10 MG PO TABS
10.0000 mg | ORAL_TABLET | Freq: Every day | ORAL | Status: DC
  Administered 2019-12-02 – 2019-12-03 (×2): 10 mg via ORAL
  Filled 2019-12-01 (×2): qty 1

## 2019-12-01 MED ORDER — LOSARTAN POTASSIUM 25 MG PO TABS
25.0000 mg | ORAL_TABLET | Freq: Every day | ORAL | Status: DC
  Administered 2019-12-02: 25 mg via ORAL
  Filled 2019-12-01 (×2): qty 1

## 2019-12-01 MED ORDER — LORATADINE 10 MG PO TABS
10.0000 mg | ORAL_TABLET | Freq: Every day | ORAL | Status: DC
  Administered 2019-12-02 – 2019-12-03 (×2): 10 mg via ORAL
  Filled 2019-12-01 (×2): qty 1

## 2019-12-01 MED ORDER — CHLORHEXIDINE GLUCONATE CLOTH 2 % EX PADS: 6.0000 | MEDICATED_PAD | Freq: Once | CUTANEOUS | Status: DC

## 2019-12-01 MED ORDER — AMIODARONE HCL 200 MG PO TABS
200.0000 mg | ORAL_TABLET | Freq: Every day | ORAL | Status: DC
  Administered 2019-12-02 – 2019-12-03 (×2): 200 mg via ORAL
  Filled 2019-12-01 (×2): qty 1

## 2019-12-01 MED ORDER — FERROUS SULFATE 325 (65 FE) MG PO TABS
325.0000 mg | ORAL_TABLET | Freq: Every day | ORAL | Status: DC
  Administered 2019-12-03: 325 mg via ORAL
  Filled 2019-12-01 (×2): qty 1

## 2019-12-01 MED ORDER — CHLORHEXIDINE GLUCONATE CLOTH 2 % EX PADS
6.0000 | MEDICATED_PAD | Freq: Once | CUTANEOUS | Status: AC
  Administered 2019-12-02: 6 via TOPICAL

## 2019-12-01 MED ORDER — CEFAZOLIN SODIUM-DEXTROSE 2-4 GM/100ML-% IV SOLN
2.0000 g | INTRAVENOUS | Status: AC
  Filled 2019-12-01: qty 100

## 2019-12-01 MED ORDER — FUROSEMIDE 40 MG PO TABS: 40.0000 mg | ORAL_TABLET | Freq: Two times a day (BID) | ORAL | Status: DC

## 2019-12-01 MED ORDER — PANTOPRAZOLE SODIUM 40 MG PO TBEC
40.0000 mg | DELAYED_RELEASE_TABLET | Freq: Every day | ORAL | Status: DC
  Administered 2019-12-02 – 2019-12-03 (×2): 40 mg via ORAL
  Filled 2019-12-01 (×2): qty 1

## 2019-12-01 MED ORDER — SPIRONOLACTONE 12.5 MG HALF TABLET
12.5000 mg | ORAL_TABLET | Freq: Every day | ORAL | Status: DC
  Administered 2019-12-02 – 2019-12-03 (×2): 12.5 mg via ORAL
  Filled 2019-12-01 (×2): qty 1

## 2019-12-01 NOTE — Consult Note (Signed)
Cardiology Consultation:   Patient ID: [name] MRN: [id number]; DOB: 1941-04-10  Admit date: 12/01/2019 Date of Consult: 12/01/2019  Primary Care Provider: Street, [name], MD [facility] Cardiologist: Lance Muss, MD  [facility] Electrophysiologist:  None    Patient Profile:   [name] [name] is a 79 y.o. 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 Hx of thoracentesis, for pl effusion, PAFl. On anticoag. admitted day prior to surgery for management of CHF who is being seen today for the evaluation of this CHF at the request of Dr. [name].  History of Present Illness:   [name] [name] with prior hx DES to LCx 02/2010, in 2016 with stress test neg for reversible ischemia.   In 2014 presented with a severe aortic insuff and 7 cm aorta exerteding into the arch and a biologic Bental was done with tissue valve and replacement of his ascending aorta and arch to Lt subclavian was performed.  and 2015.  Prior to annual CT he had chest pain and had type 3 aortic dissection treated medically.  Several months later + CHF and CT of chest with enlargement of proximal descending aorta from 4.8 to 6 cm with re-opacification of the false lumen that extended but did not extend entire length to the thoracic aorta and did not involve the femorals.  at that time he underwent a stent graft in the old ascending aorta and arch graft and and a carotid subclavian bypass to allow coverage of the left subclavian artery and preserved flow to the left vertebral 2015.    In April 2/21 he had CHF and EF by echo was 25-30%.   The left ventricle has severely decreased function. The left ventricle demonstrates global hypokinesis.  AV with mild mitral valve  regurg. Mean gradient of 7 mmHg and AVR is trivial   He did have Rt and Left cath after eliquis held and IV heparin, also diuresed.   10/06/19 cardiac cath-- Right heart cath revealed moderate pulmonary hypertension, and elevated pulmonary wedge mean 32 mmHg.  Mildly elevated mean right atrial pressure, 10 mmHg,   Lt heart cath with totally occluded mRCA collateralized Rt to Rt and Left to right , patent left main, Proximal to mid circumflex stent is patent with up to 50% distal in-stent restenosis  EP saw patient in hospital and recommended cardioversion after 3 weeks of anticoagulation. But on post hospital office visit pt was in SB with 1st degree AV block.   Dr. Mercer Pod saw the pt 10/16/19 and pt wanted to have surgery on hernia so recommendations were to take a third dose of lasix the day before the surgery- admit day before and tune up prior to surgery.  Post op may be able to back off BB to 50 mg daily.   Pt is on amiodarone.   [name] did not feel he needed further ischemic testing prior to surgery.  Today he presents for tune up prior to surgery tomorrow to repair Lt inguinal hernia with mesh.   BP 164/69   Past Medical History:  Diagnosis Date  . Aortic valve insufficiency   . Arthritis    "probably in my knees before they replaced them" (01/28/2015)  . Atrial flutter (HCC)   . CAD (coronary artery  disease)   . CHF (congestive heart failure) (HCC)   . Descending aortic aneurysm (HCC)   . DVT (deep venous thrombosis) (HCC)    ?LLE post knee surgery  . Dyspnea 09/2019  . History of blood transfusion    "after one of my knee surgeries"  . HTN (hypertension)   . Hyperlipidemia   . Thoracic aneurysm   . Tobacco abuse     Past Surgical History:  Procedure Laterality Date  . AORTIC VALVE REPLACEMENT  02/19/2009  . AORTO-FEMORAL BYPASS GRAFT  03/2010   ascending aortic and arch aneurysm repair/notes 04/09/2009  . CARDIAC VALVE REPLACEMENT    . CAROTID-SUBCLAVIAN BYPASS  GRAFT Left 08/26/2013   Procedure: BYPASS GRAFT CAROTID-SUBCLAVIAN;  Surgeon: Nada Libman, MD;  Location: [facility] OR;  Service: Vascular;  Laterality: Left;  . CATARACT EXTRACTION W/ INTRAOCULAR LENS  IMPLANT, BILATERAL Bilateral   . ENDOVASCULAR STENT INSERTION N/A 08/26/2013   Procedure:  THORACIC STENT GRAFT INSERTION;  Surgeon: Nada Libman, MD;  Location: [facility] OR;  Service: Vascular;  Laterality: N/A;  . EYE SURGERY    . JOINT REPLACEMENT    . KNEE ARTHROSCOPY Bilateral   . REPLACEMENT TOTAL KNEE Bilateral   . right groin lymphocele Right 03/29/2009  . RIGHT/LEFT HEART CATH AND CORONARY ANGIOGRAPHY N/A 10/06/2019   Procedure: RIGHT/LEFT HEART CATH AND CORONARY ANGIOGRAPHY;  Surgeon: Lyn Records, MD;  Location: [facility] INVASIVE CV LAB;  Service: Cardiovascular;  Laterality: N/A;  . THORACENTESIS  2010 X 2  . TONSILLECTOMY       Home Medications:  Prior to Admission medications   Medication Sig Start Date End Date Taking? Authorizing Provider  acetaminophen (TYLENOL) 500 MG tablet Take 2 tablets (1,000 mg total) by mouth every 6 (six) hours as needed. Patient taking differently: Take 1,000 mg by mouth every 6 (six) hours as needed for mild pain or moderate pain.  09/08/19  Yes Barnetta Chapel, PA-C  amiodarone (PACERONE) 200 MG tablet Take 1 tablet (200 mg total) by mouth daily. 05/29/19  Yes [name], MD  apixaban (ELIQUIS) 5 MG TABS tablet Take 1 tablet (5 mg total) by mouth 2 (two) times daily. 05/29/19  Yes [name], MD  atorvastatin (LIPITOR) 10 MG tablet Take 1 tablet by mouth once daily Patient taking differently: Take 10 mg by mouth daily.  06/16/19  Yes [name], MD  ferrous sulfate 325 (65 FE) MG tablet Take 1 tablet (325 mg total) by mouth daily with breakfast. 10/16/19  Yes [name], MD  furosemide (LASIX) 40 MG tablet Take 1 tablet by mouth twice daily Patient taking differently: Take 40 mg by mouth 2 (two) times daily.  06/02/19  Yes  [name], MD  lansoprazole (PREVACID) 30 MG capsule Take 30 mg by mouth daily. 08/13/19  Yes [provider]  loratadine (CLARITIN) 10 MG tablet Take 1 tablet (10 mg total) by mouth daily. 06/01/14  Yes [name], MD  losartan (COZAAR) 25 MG tablet Take 1 tablet (25 mg total) by mouth daily. 11/03/19  Yes [name], MD  methocarbamol (ROBAXIN) 500 MG tablet Take 2 tablets (1,000 mg total) by mouth every 8 (eight) hours as needed for muscle spasms. 09/08/19  Yes Barnetta Chapel, PA-C  metoprolol succinate (TOPROL-XL) 100 MG 24 hr tablet Take 1 tablet (100 mg total) by mouth daily. Take with or immediately following a meal. 10/16/19  Yes [name], MD  Multiple Vitamin (MULTIVITAMIN WITH MINERALS) TABS  tablet Take 1 tablet by mouth daily.   Yes [provider]  potassium chloride SA (KLOR-CON) 20 MEQ tablet Take 2 tablets by mouth once daily Patient taking differently: Take 40 mEq by mouth daily.  06/16/19  Yes [name], MD  spironolactone (ALDACTONE) 25 MG tablet Take 0.5 tablets (12.5 mg total) by mouth daily. 10/16/19  Yes [name], MD  nitroGLYCERIN (NITROSTAT) 0.4 MG SL tablet Place 1 tablet (0.4 mg total) under the tongue every 5 (five) minutes as needed for chest pain (X3 DOSES BEFORE CALLING 911). Patient not taking: Reported on 11/24/2019 05/29/19   [name], MD    Inpatient Medications: Scheduled Meds: . Chlorhexidine Gluconate Cloth  6 each Topical Once   And  . Chlorhexidine Gluconate Cloth  6 each Topical Once   Continuous Infusions: . [START ON 12/02/2019]  ceFAZolin (ANCEF) IV     PRN Meds:   Allergies:    Allergies  Allergen Reactions  . Lortab [Hydrocodone-Acetaminophen] Hives    No trouble breathing  . Morphine And Related Hives  . Other Other (See Comments)    Staples from surgery caused infection    Social History:   Social History   Socioeconomic History  . Marital  status: Married    Spouse name: Not on file  . Number of children: Not on file  . Years of education: Not on file  . Highest education level: Not on file  Occupational History  . Not on file  Tobacco Use  . Smoking status: Former Smoker    Packs/day: 0.00    Types: Cigarettes    Quit date: 04/16/1974    Years since quitting: 45.6  . Smokeless tobacco: Never Used  Vaping Use  . Vaping Use: Never used  Substance and Sexual Activity  . Alcohol use: No  . Drug use: No  . Sexual activity: Not Currently  Other Topics Concern  . Not on file  Social History Narrative  . Not on file   Social Determinants of Health   Financial Resource Strain:   . Difficulty of Paying Living Expenses:   Food Insecurity:   . Worried About Programme researcher, broadcasting/film/video in the Last Year:   . Barista in the Last Year:   Transportation Needs:   . Freight forwarder (Medical):   Marland Kitchen Lack of Transportation (Non-Medical):   Physical Activity:   . Days of Exercise per Week:   . Minutes of Exercise per Session:   Stress:   . Feeling of Stress :   Social Connections:   . Frequency of Communication with Friends and Family:   . Frequency of Social Gatherings with Friends and Family:   . Attends Religious Services:   . Active Member of Clubs or Organizations:   . Attends Banker Meetings:   Marland Kitchen Marital Status:   Intimate Partner Violence:   . Fear of Current or Ex-Partner:   . Emotionally Abused:   Marland Kitchen Physically Abused:   . Sexually Abused:     Family History:    Family History  Problem Relation Age of Onset  . Celiac disease Daughter   . Aortic aneurysm Father   . Hypertension Mother   . Heart attack Neg Hx   . Stroke Neg Hx      ROS:  Please see the history of present illness.  General:no colds or fevers, no weight changes Skin:no rashes or ulcers HEENT:no blurred vision, no congestion CV:see HPI PUL:see HPI +  tobacco use GI:no diarrhea constipation or melena, no  indigestion GU:no hematuria, no dysuria MS:no joint pain, no claudication Neuro:no syncope, no lightheadedness Endo:no diabetes, no thyroid disease  All other ROS reviewed and negative.     Physical Exam/Data:  EXAM per Dr. Harrell Gave.    Vitals:   12/01/19 1514  BP: (!) 164/69  Pulse: 80  Resp: 20  Temp: 99.2 F (37.3 C)  TempSrc: Oral  SpO2: 99%   No intake or output data in the 24 hours ending 12/01/19 1745 Last 3 Weights 10/16/2019 10/07/2019 10/06/2019  Weight (lbs) 178 lb 3.2 oz 175 lb 14.8 oz 180 lb 1.9 oz  Weight (kg) 80.831 kg 79.8 kg 81.7 kg     There is no height or weight on file to calculate BMI.   EKG:  The EKG was personally reviewed and demonstrates:  Pending  Telemetry:  Telemetry was personally reviewed and demonstrates:  pending  Relevant CV Studies: Cardiac cath 09/2019    "Chronic total occlusion of the mid right coronary, collateralized right to right and left to right.  Widely patent left main.  Widely patent LAD with moderate luminal irregularities proximal to distal.  Widely patent stent in the proximal to mid circumflex with up to 50% narrowing distal to the stent.  Moderate pulmonary hypertension. Mean pulmonary artery pressure 35 mmHg.  Mean pulmonary capillary wedge pressure 32 mmHg.  RECOMMENDATIONS:  Left ventricular systolic dysfunction is not on the basis of coronary disease. Consider rate related cardiomyopathy."    Laboratory Data:  High Sensitivity Troponin:  No results for input(s): TROPONINIHS in the last 720 hours.   ChemistryNo results for input(s): NA, K, CL, CO2, GLUCOSE, BUN, CREATININE, CALCIUM, GFRNONAA, GFRAA, ANIONGAP in the last 168 hours.  No results for input(s): PROT, ALBUMIN, AST, ALT, ALKPHOS, BILITOT in the last 168 hours. HematologyNo results for input(s): WBC, RBC, HGB, HCT, MCV, MCH, MCHC, RDW, PLT in the last 168 hours. BNPNo results for input(s): BNP, PROBNP in the last 168 hours.  DDimer No  results for input(s): DDIMER in the last 168 hours.   Radiology/Studies:  No results found.       NO CHEST PAIN Assessment and Plan:   1. Chronic CHF - systolic - on lasix 40 mg BID, may need extra dose per Dr. Harrell Gave. No current labs.   2. CAD with hx of patent stent to LCX per cath 09/2019 no further ischemic work up.  See cath report above 3. PAFl. Was in on recent hospitalization but in [place] on Office visit 10/16/19.  Takes eliquis  ? Last dose  Also on amiodarone. And BB 4. 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 HTN on cozaar 25 mg  And torpol XL 100 mg daily.  6. Hx AV repair and stable on last echo 10/03/19 7. Chronic anemia with goal >9.5 per last hospitalization notes. 8. Hx of repair of ascending and arch aortic aneurysm and stent to descending aortic aneurysm and hx Lt subclavian carotid bypass, ligation of subclavian artery stable 2019. Followed by Dr. Servando Snare.       For questions or updates, please contact [place] Please consult [email or web address] for contact info under    Signed, Cecilie Kicks, NP  12/01/2019 5:45 PM

## 2019-12-01 NOTE — H&P (Signed)
[name] [name] is an 79 y.o. male.   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 Initially scheduled for repair in March but was cancelled due to admission to the Trauma Service after a fall with rib FXs. He was then scheduled in April but was cancelled in pre-op holding to to new onset CHF. Scheduled for repair tomorrow. He is feeling better.  Past Medical History:  Diagnosis Date  . Aortic valve insufficiency   . Arthritis    "probably in my knees before they replaced them" (01/28/2015)  . Atrial flutter (HCC)   . CAD (coronary artery disease)   . CHF (congestive heart failure) (HCC)   . Descending aortic aneurysm (HCC)   . DVT (deep venous thrombosis) (HCC)    ?LLE post knee surgery  . Dyspnea 09/2019  . History of blood transfusion    "after one of my knee surgeries"  . HTN (hypertension)   . Hyperlipidemia   . Thoracic aneurysm   . Tobacco abuse     Past Surgical History:  Procedure Laterality Date  . AORTIC VALVE REPLACEMENT  02/19/2009  . AORTO-FEMORAL BYPASS GRAFT  03/2010   ascending aortic and arch aneurysm repair/notes 04/09/2009  . CARDIAC VALVE REPLACEMENT    . CAROTID-SUBCLAVIAN BYPASS GRAFT Left 08/26/2013   Procedure: BYPASS GRAFT CAROTID-SUBCLAVIAN;  Surgeon: Nada Libman, MD;  Location: [facility] OR;  Service: Vascular;  Laterality: Left;  . CATARACT EXTRACTION W/ INTRAOCULAR LENS  IMPLANT, BILATERAL Bilateral   . ENDOVASCULAR STENT INSERTION N/A 08/26/2013   Procedure:  THORACIC STENT GRAFT INSERTION;  Surgeon: Nada Libman, MD;  Location: [facility] OR;  Service: Vascular;  Laterality: N/A;  . EYE SURGERY    .  JOINT REPLACEMENT    . KNEE ARTHROSCOPY Bilateral   . REPLACEMENT TOTAL KNEE Bilateral   . right groin lymphocele Right 03/29/2009  . RIGHT/LEFT HEART CATH AND CORONARY ANGIOGRAPHY N/A 10/06/2019   Procedure: RIGHT/LEFT HEART CATH AND CORONARY ANGIOGRAPHY;  Surgeon: Lyn Records, MD;  Location: [facility] INVASIVE CV LAB;  Service: Cardiovascular;  Laterality: N/A;  . THORACENTESIS  2010 X 2  . TONSILLECTOMY      Family History  Problem Relation Age of Onset  . Celiac disease Daughter   . Aortic aneurysm Father   . Hypertension Mother   . Heart attack Neg Hx   . Stroke Neg Hx    Social History:  reports that he quit smoking about 45 years ago. His smoking use included cigarettes. He smoked 0.00 packs per day. He has never used smokeless tobacco. He reports that he does not drink alcohol and does not use drugs.  Allergies:  Allergies  Allergen Reactions  . Lortab [Hydrocodone-Acetaminophen] Hives    No trouble breathing  . Morphine And Related Hives  . Other Other (See Comments)    Staples from surgery caused infection    Medications Prior to Admission  Medication Sig Dispense Refill  . acetaminophen (TYLENOL) 500 MG tablet Take 2 tablets (1,000 mg total) by mouth every 6 (six) hours as needed. (Patient taking differently: Take 1,000 mg by mouth every 6 (six) hours as needed  for mild pain or moderate pain. ) 30 tablet 0  . amiodarone (PACERONE) 200 MG tablet Take 1 tablet (200 mg total) by mouth daily. 30 tablet 11  . apixaban (ELIQUIS) 5 MG TABS tablet Take 1 tablet (5 mg total) by mouth 2 (two) times daily. 180 tablet 1  . atorvastatin (LIPITOR) 10 MG tablet Take 1 tablet by mouth once daily (Patient taking differently: Take 10 mg by mouth daily. ) 90 tablet 3  . ferrous sulfate 325 (65 FE) MG tablet Take 1 tablet (325 mg total) by mouth daily with breakfast. 90 tablet 3  . furosemide (LASIX) 40 MG tablet Take 1 tablet by mouth twice daily (Patient taking differently: Take 40 mg by  mouth 2 (two) times daily. ) 180 tablet 3  . lansoprazole (PREVACID) 30 MG capsule Take 30 mg by mouth daily.    Marland Kitchen loratadine (CLARITIN) 10 MG tablet Take 1 tablet (10 mg total) by mouth daily. 30 tablet 11  . losartan (COZAAR) 25 MG tablet Take 1 tablet (25 mg total) by mouth daily. 90 tablet 3  . methocarbamol (ROBAXIN) 500 MG tablet Take 2 tablets (1,000 mg total) by mouth every 8 (eight) hours as needed for muscle spasms. 30 tablet 0  . metoprolol succinate (TOPROL-XL) 100 MG 24 hr tablet Take 1 tablet (100 mg total) by mouth daily. Take with or immediately following a meal. 90 tablet 3  . Multiple Vitamin (MULTIVITAMIN WITH MINERALS) TABS tablet Take 1 tablet by mouth daily.    . potassium chloride SA (KLOR-CON) 20 MEQ tablet Take 2 tablets by mouth once daily (Patient taking differently: Take 40 mEq by mouth daily. ) 180 tablet 3  . spironolactone (ALDACTONE) 25 MG tablet Take 0.5 tablets (12.5 mg total) by mouth daily. 45 tablet 3  . nitroGLYCERIN (NITROSTAT) 0.4 MG SL tablet Place 1 tablet (0.4 mg total) under the tongue every 5 (five) minutes as needed for chest pain (X3 DOSES BEFORE CALLING 911). (Patient not taking: Reported on 11/24/2019) 25 tablet 3    No results found for this or any previous visit (from the past 48 hour(s)). No results found.  Review of Systems  Constitutional: Negative for activity change.  HENT: Negative.   Eyes: Negative.   Respiratory: Positive for shortness of breath.   Cardiovascular: Negative for chest pain.  Gastrointestinal:       Hernia  Endocrine: Negative.   Genitourinary:       Hernia requires assistance with urination  Musculoskeletal: Negative.   Skin: Negative.   Allergic/Immunologic: Negative.   Neurological: Negative.   Hematological: Bruises/bleeds easily.  Psychiatric/Behavioral: Negative.     Blood pressure (!) 164/69, pulse 80, temperature 99.2 F (37.3 C), temperature source Oral, resp. rate 20, SpO2 99 %. Physical Exam   Constitutional: He is oriented to person, place, and time.  HENT:  Head: Normocephalic.  Right Ear: External ear normal.  Left Ear: External ear normal.  Nose: Nose normal.  Mouth/Throat: Mucous membranes are moist.  Eyes: Pupils are equal, round, and reactive to light. Conjunctivae are normal.  Cardiovascular: Normal rate. An irregular rhythm present.  Murmur heard. Respiratory: Effort normal. No stridor. No respiratory distress. He has rales.  GI: Normal appearance and bowel sounds are normal.  Large incarcerated LIH  Genitourinary:    Genitourinary Comments: See above   Musculoskeletal:        General: Normal range of motion.     Cervical back: Normal range of motion.  Neurological: He is alert and  oriented to person, place, and time.  Skin: Skin is warm. Capillary refill takes 2 to 3 seconds.  Psychiatric: Mood normal.     Assessment/Plan Large incarcerated left inguinal hernia -for repair tomorrow.  I again discussed the procedure, risks, benefits, and expected postoperative course.  He has a significant risk of cardiac and pulmonary complications which we again have discussed in detail at my office.  Per recommendations byVaranasi, I have admitted him the day prior so he may undergo evaluation and diuresing by the cardiology team.  We also discussed the expected postoperative course including potential need for rehabilitation services.  CAD, atrial fibrillation, CHF -Eliquis on hold for surgery.  I have consulted cardiology for perioperative assessment and management and I appreciate their assistance.  I spoke with his wife and daughter as well. Admit to inpatient.  Zenovia Jarred, MD 12/01/2019, 4:52 PM

## 2019-12-02 ENCOUNTER — Encounter (HOSPITAL_COMMUNITY): Payer: Self-pay | Admitting: General Surgery

## 2019-12-02 ENCOUNTER — Inpatient Hospital Stay (HOSPITAL_COMMUNITY): Payer: Medicare Other | Admitting: Anesthesiology

## 2019-12-02 ENCOUNTER — Encounter (HOSPITAL_COMMUNITY)
Admission: RE | Disposition: A | Discharge status: Home / Self Care | Payer: Self-pay | Source: Ambulatory Visit | Attending: General Surgery

## 2019-12-02 ENCOUNTER — Inpatient Hospital Stay (HOSPITAL_COMMUNITY): Payer: Medicare Other

## 2019-12-02 HISTORY — PX: INSERTION OF MESH: SHX5868

## 2019-12-02 HISTORY — PX: INGUINAL HERNIA REPAIR: SHX194

## 2019-12-02 LAB — SURGICAL PCR SCREEN
MRSA, PCR: NEGATIVE
Staphylococcus aureus: NEGATIVE

## 2019-12-02 LAB — BASIC METABOLIC PANEL
Anion gap: 10 (ref 5–15)
BUN: 23 mg/dL (ref 8–23)
CO2: 28 mmol/L (ref 22–32)
Calcium: 8.7 mg/dL — ABNORMAL LOW (ref 8.9–10.3)
Chloride: 101 mmol/L (ref 98–111)
Creatinine, Ser: 1.13 mg/dL (ref 0.61–1.24)
GFR calc Af Amer: >60 mL/min (ref >60)
GFR calc non Af Amer: >60 mL/min (ref >60)
Glucose, Bld: 95 mg/dL (ref 70–99)
Potassium: 3.9 mmol/L (ref 3.5–5.1)
Sodium: 139 mmol/L (ref 135–145)

## 2019-12-02 LAB — CBC
HCT: 32.7 % — ABNORMAL LOW (ref 39.0–52.0)
Hemoglobin: 10.1 g/dL — ABNORMAL LOW (ref 13.0–17.0)
MCH: 30.1 pg (ref 26.0–34.0)
MCHC: 30.9 g/dL (ref 30.0–36.0)
MCV: 97.6 fL (ref 80.0–100.0)
Platelets: 174 10*3/uL (ref 150–400)
RBC: 3.35 MIL/uL — ABNORMAL LOW (ref 4.22–5.81)
RDW: 20.3 % — ABNORMAL HIGH (ref 11.5–15.5)
WBC: 5.4 10*3/uL (ref 4.0–10.5)
nRBC: 0 % (ref 0.0–0.2)

## 2019-12-02 SURGERY — REPAIR, HERNIA, INGUINAL, ADULT
Anesthesia: General | Site: Inguinal | Laterality: Left

## 2019-12-02 MED ORDER — SUGAMMADEX SODIUM 200 MG/2ML IV SOLN
INTRAVENOUS | Status: DC | PRN
Start: 2019-12-02 — End: 2019-12-02
  Administered 2019-12-02: 350 mg via INTRAVENOUS

## 2019-12-02 MED ORDER — ONDANSETRON HCL 4 MG/2ML IJ SOLN
INTRAMUSCULAR | Status: DC | PRN
  Administered 2019-12-02: 4 mg via INTRAVENOUS

## 2019-12-02 MED ORDER — LACTATED RINGERS IV SOLN: INTRAVENOUS | Status: DC

## 2019-12-02 MED ORDER — PHENYLEPHRINE HCL-NACL 10-0.9 MG/250ML-% IV SOLN
INTRAVENOUS | Status: DC | PRN
Start: 2019-12-02 — End: 2019-12-02
  Administered 2019-12-02: 25 ug/min via INTRAVENOUS

## 2019-12-02 MED ORDER — FENTANYL CITRATE (PF) 100 MCG/2ML IJ SOLN
INTRAMUSCULAR | Status: AC
  Administered 2019-12-02: 100 ug
  Filled 2019-12-02: qty 2

## 2019-12-02 MED ORDER — BUPIVACAINE HCL (PF) 0.25 % IJ SOLN
INTRAMUSCULAR | Status: AC
  Filled 2019-12-02: qty 30

## 2019-12-02 MED ORDER — POTASSIUM CHLORIDE 10 MEQ/100ML IV SOLN
10.0000 meq | INTRAVENOUS | Status: AC
  Administered 2019-12-02 (×2): 10 meq via INTRAVENOUS
  Filled 2019-12-02 (×2): qty 100

## 2019-12-02 MED ORDER — FENTANYL CITRATE (PF) 250 MCG/5ML IJ SOLN
INTRAMUSCULAR | Status: AC
  Filled 2019-12-02: qty 5

## 2019-12-02 MED ORDER — FENTANYL CITRATE (PF) 100 MCG/2ML IJ SOLN
INTRAMUSCULAR | Status: DC | PRN
  Administered 2019-12-02: 50 ug via INTRAVENOUS

## 2019-12-02 MED ORDER — ORAL CARE MOUTH RINSE: 15.0000 mL | Freq: Once | OROMUCOSAL | Status: AC

## 2019-12-02 MED ORDER — ROCURONIUM BROMIDE 10 MG/ML (PF) SYRINGE
PREFILLED_SYRINGE | INTRAVENOUS | Status: DC | PRN
Start: 2019-12-02 — End: 2019-12-02
  Administered 2019-12-02: 80 mg via INTRAVENOUS

## 2019-12-02 MED ORDER — TRAMADOL HCL 50 MG PO TABS
50.0000 mg | ORAL_TABLET | Freq: Four times a day (QID) | ORAL | Status: DC | PRN
  Administered 2019-12-02 – 2019-12-03 (×2): 50 mg via ORAL
  Filled 2019-12-02 (×2): qty 1

## 2019-12-02 MED ORDER — 0.9 % SODIUM CHLORIDE (POUR BTL) OPTIME
TOPICAL | Status: DC | PRN
  Administered 2019-12-02: 1000 mL

## 2019-12-02 MED ORDER — MIDAZOLAM HCL 2 MG/2ML IJ SOLN
INTRAMUSCULAR | Status: AC
  Administered 2019-12-02: 2 mg
  Filled 2019-12-02: qty 2

## 2019-12-02 MED ORDER — TRAMADOL HCL 50 MG PO TABS: 100.0000 mg | ORAL_TABLET | Freq: Four times a day (QID) | ORAL | Status: DC | PRN

## 2019-12-02 MED ORDER — LIDOCAINE 2% (20 MG/ML) 5 ML SYRINGE
INTRAMUSCULAR | Status: DC | PRN
  Administered 2019-12-02: 100 mg via INTRAVENOUS

## 2019-12-02 MED ORDER — CHLORHEXIDINE GLUCONATE 0.12 % MT SOLN
15.0000 mL | Freq: Once | OROMUCOSAL | Status: AC
  Administered 2019-12-02: 15 mL via OROMUCOSAL
  Filled 2019-12-02: qty 15

## 2019-12-02 MED ORDER — DEXAMETHASONE SODIUM PHOSPHATE 4 MG/ML IJ SOLN
INTRAMUSCULAR | Status: DC | PRN
  Administered 2019-12-02: 4 mg via INTRAVENOUS

## 2019-12-02 MED ORDER — BUPIVACAINE HCL (PF) 0.25 % IJ SOLN
INTRAMUSCULAR | Status: DC | PRN
  Administered 2019-12-02: 10 mL

## 2019-12-02 MED ORDER — STERILE WATER FOR IRRIGATION IR SOLN
Status: DC | PRN
  Administered 2019-12-02: 1000 mL

## 2019-12-02 MED ORDER — ONDANSETRON HCL 4 MG/2ML IJ SOLN
4.0000 mg | INTRAMUSCULAR | Status: DC | PRN
  Administered 2019-12-02: 4 mg via INTRAVENOUS
  Filled 2019-12-02: qty 2

## 2019-12-02 MED ORDER — PROPOFOL 10 MG/ML IV BOLUS
INTRAVENOUS | Status: DC | PRN
  Administered 2019-12-02: 100 mg via INTRAVENOUS

## 2019-12-02 MED ORDER — PHENYLEPHRINE 40 MCG/ML (10ML) SYRINGE FOR IV PUSH (FOR BLOOD PRESSURE SUPPORT)
PREFILLED_SYRINGE | INTRAVENOUS | Status: DC | PRN
  Administered 2019-12-02: 120 ug via INTRAVENOUS
  Administered 2019-12-02 (×2): 80 ug via INTRAVENOUS

## 2019-12-02 MED ORDER — BUPIVACAINE-EPINEPHRINE (PF) 0.5% -1:200000 IJ SOLN
INTRAMUSCULAR | Status: DC | PRN
Start: 2019-12-02 — End: 2019-12-02
  Administered 2019-12-02: 30 mL via PERINEURAL

## 2019-12-02 MED ORDER — CEFAZOLIN SODIUM-DEXTROSE 2-3 GM-%(50ML) IV SOLR
INTRAVENOUS | Status: DC | PRN
Start: 2019-12-02 — End: 2019-12-02
  Administered 2019-12-02: 2 g via INTRAVENOUS

## 2019-12-02 SURGICAL SUPPLY — 42 items
BLADE CLIPPER SURG (BLADE) ×3 IMPLANT
CANISTER SUCT 3000ML PPV (MISCELLANEOUS) ×3 IMPLANT
CHLORAPREP W/TINT 26 (MISCELLANEOUS) ×3 IMPLANT
COVER SURGICAL LIGHT HANDLE (MISCELLANEOUS) ×3 IMPLANT
DERMABOND ADVANCED (GAUZE/BANDAGES/DRESSINGS) ×2
DERMABOND ADVANCED .7 DNX12 (GAUZE/BANDAGES/DRESSINGS) ×1 IMPLANT
DRAIN PENROSE 1/2X12 LTX STRL (WOUND CARE) ×3 IMPLANT
DRAPE LAPAROTOMY 100X72 PEDS (DRAPES) ×3 IMPLANT
ELECT BLADE 6.5 EXT (BLADE) ×3 IMPLANT
ELECT REM PT RETURN 9FT ADLT (ELECTROSURGICAL) ×3
ELECTRODE REM PT RTRN 9FT ADLT (ELECTROSURGICAL) ×1 IMPLANT
GAUZE SPONGE 4X4 12PLY STRL (GAUZE/BANDAGES/DRESSINGS) ×3 IMPLANT
GLOVE BIO SURGEON STRL SZ 6 (GLOVE) ×3 IMPLANT
GLOVE BIO SURGEON STRL SZ8 (GLOVE) ×3 IMPLANT
GLOVE BIOGEL PI IND STRL 6.5 (GLOVE) ×1 IMPLANT
GLOVE BIOGEL PI IND STRL 8 (GLOVE) ×1 IMPLANT
GLOVE BIOGEL PI INDICATOR 6.5 (GLOVE) ×2
GLOVE BIOGEL PI INDICATOR 8 (GLOVE) ×2
GOWN STRL REUS W/ TWL LRG LVL3 (GOWN DISPOSABLE) ×2 IMPLANT
GOWN STRL REUS W/ TWL XL LVL3 (GOWN DISPOSABLE) ×1 IMPLANT
GOWN STRL REUS W/TWL LRG LVL3 (GOWN DISPOSABLE) ×4
GOWN STRL REUS W/TWL XL LVL3 (GOWN DISPOSABLE) ×2
KIT BASIN OR (CUSTOM PROCEDURE TRAY) ×3 IMPLANT
KIT TURNOVER KIT B (KITS) ×3 IMPLANT
MESH HERNIA 3X6 (Mesh General) ×3 IMPLANT
NEEDLE 22X1 1/2 (OR ONLY) (NEEDLE) ×3 IMPLANT
NS IRRIG 1000ML POUR BTL (IV SOLUTION) ×3 IMPLANT
PACK GENERAL/GYN (CUSTOM PROCEDURE TRAY) ×3 IMPLANT
PAD ARMBOARD 7.5X6 YLW CONV (MISCELLANEOUS) ×3 IMPLANT
PENCIL SMOKE EVACUATOR (MISCELLANEOUS) ×3 IMPLANT
SLEEVE SUCTION CATH 165 (SLEEVE) ×3 IMPLANT
SUT MNCRL AB 4-0 PS2 18 (SUTURE) ×3 IMPLANT
SUT PROLENE 2 0 CT2 30 (SUTURE) ×12 IMPLANT
SUT VIC AB 2-0 SH 27 (SUTURE) ×4
SUT VIC AB 2-0 SH 27X BRD (SUTURE) ×2 IMPLANT
SUT VIC AB 3-0 SH 27 (SUTURE) ×2
SUT VIC AB 3-0 SH 27X BRD (SUTURE) ×1 IMPLANT
SUT VICRYL AB 3 0 TIES (SUTURE) IMPLANT
TAPE CLOTH SURG 4X10 WHT LF (GAUZE/BANDAGES/DRESSINGS) ×3 IMPLANT
TOWEL GREEN STERILE (TOWEL DISPOSABLE) ×3 IMPLANT
TOWEL GREEN STERILE FF (TOWEL DISPOSABLE) ×3 IMPLANT
TRAY FOLEY W/BAG SLVR 14FR (SET/KITS/TRAYS/PACK) ×3 IMPLANT

## 2019-12-02 NOTE — Op Note (Signed)
12/02/2019  4:19 PM  PATIENT:  [name] [name]  79 y.o. male  PRE-OPERATIVE DIAGNOSIS:  IRREDUCIBLE LEFT INGUINAL HERNIA  POST-OPERATIVE DIAGNOSIS:  IRREDUCIBLE LEFT INGUINAL HERNIA  PROCEDURE:  Procedure(s): REPAIR INCARCERATED LEFT INGUINAL HERNIA WITH MESH INSERTION OF MESH  SURGEON:  Surgeon(s): Violeta Gelinas, MD  ASSISTANTS: Bailey Mech, PA-C  ANESTHESIA:   local, regional and general  EBL:  Total I/O In: 450 [I.V.:450] Out: 205 [Urine:200; Blood:5]  BLOOD ADMINISTERED:none  DRAINS: none   SPECIMEN:  No Specimen  DISPOSITION OF SPECIMEN:  N/A  COUNTS:  YES  DICTATION: .Dragon Dictation Findings: Very large incarcerated direct left inguinal hernia without bowel compromise, hernia sac not violated.  Procedure in detail: Patient presents for repair of chronically incarcerated left inguinal hernia with mesh.  He received a TAP block by anesthesia.  Informed consent was obtained.  He was brought to the operating room.  General endotracheal anesthesia was administered by the anesthesia staff.  Foley catheter was placed by nursing.  His penis, scrotum, and abdomen were all prepped and draped in a sterile fashion.  We did a timeout procedure.  Local was injected along the planned line of incision.  In the left inguinal incision.  Subcutaneous tissues were dissected down using cautery and achieving excellent hemostasis until viable reach the level of the external oblique fascia.  This was completely obliterated by this large hernia.  I opened what remained of it out laterally and dissected the superior leaflet of the external oblique, what was left of it, the transversalis.  I dissected the inferior leaflet off of the shelving edge of inguinal ligament.  The hernia sac was then delivered out of the scrotum.  I carefully dissected it free from the scrotal structures and the hernia was identified as a direct inguinal hernia.  It was reduced into the abdomen without difficulty.   There was no evidence of bowel compromise.  Cord structures were intact.  I then completed a repair with a large keyhole polypropylene mesh.  This was cut to custom shape and size and much larger than usual due to the size of this hernia defect.  The hernia repair was completed by tacking the mesh first to the tissues of the pubic tubercle with 2-0 Prolene.  Then it was sewn with running 2-0 Prolene inferiorly to the shelving edge of inguinal ligament.  Next, I used multiple interrupted 2-0 Prolene's to suture it again to the tissues over the pubic tubercle and gradually extending out laterally to the transversalis.  I rejoined the 2 leaflets of the mesh behind the cord structures laterally and they were tacked together and down to the underlying tissues.  I put several extra sutures due to the size of this hernia.  I then did copious irrigation and meticulous hemostasis was obtained.  His left testicle was returned to anatomic position in the scrotum.  What was able to be closed and the external Bleich was closed with running 2-0 Vicryl.  Subcutaneous tissues were approximated with interrupted 3-0 Vicryl.  Skin was closed with running 4-0 Monocryl subcuticular followed by Dermabond.  A sterile dressing was then applied once the Dermabond was dried and a tolerated the procedure well without apparent complication and was taken recovery in stable condition.  All counts were correct. PATIENT DISPOSITION:  PACU - hemodynamically stable.   Delay start of Pharmacological VTE agent (>24hrs) due to surgical blood loss or risk of bleeding:  no  Violeta Gelinas, MD, MPH, FACS Pager: [phone]  6/15/20214:19  PM

## 2019-12-02 NOTE — Anesthesia Procedure Notes (Signed)
Procedure Name: Intubation Date/Time: 12/02/2019 2:41 PM Performed by: Bryson Corona, CRNA Pre-anesthesia Checklist: Patient identified, Emergency Drugs available, Suction available and Patient being monitored Patient Re-evaluated:Patient Re-evaluated prior to induction Oxygen Delivery Method: Circle System Utilized Preoxygenation: Pre-oxygenation with 100% oxygen Induction Type: IV induction and Cricoid Pressure applied Ventilation: Mask ventilation without difficulty Laryngoscope Size: Mac and 4 Grade View: Grade I Tube type: Oral Tube size: 7.5 mm Number of attempts: 1 Airway Equipment and Method: Stylet Placement Confirmation: ETT inserted through vocal cords under direct vision,  positive ETCO2 and breath sounds checked- equal and bilateral Secured at: 22 cm Tube secured with: Tape Dental Injury: Teeth and Oropharynx as per pre-operative assessment  Comments: Placed by Lavada Mesi, SRNA

## 2019-12-02 NOTE — Anesthesia Procedure Notes (Signed)
Anesthesia Regional Block: TAP block   Pre-Anesthetic Checklist: ,, timeout performed, Correct Patient, Correct Site, Correct Laterality, Correct Procedure, Correct Position, site marked, Risks and benefits discussed,  Surgical consent,  Pre-op evaluation,  At surgeon's request and post-op pain management  Laterality: Left  Prep: chloraprep       Needles:  Injection technique: Single-shot  Needle Type: Echogenic Stimulator Needle          Additional Needles:   Narrative:  Start time: 12/02/2019 2:10 PM End time: 12/02/2019 2:20 PM Injection made incrementally with aspirations every 5 mL.  Performed by: Personally  Anesthesiologist: Arta Bruce, MD  Additional Notes: Monitors applied. Patient sedated. Sterile prep and drape,hand hygiene and sterile gloves were used. Relevant anatomy identified.Needle position confirmed.Local anesthetic injected incrementally after negative aspiration. Local anesthetic spread visualized in Transversus Abdominus Plane. Vascular puncture avoided. No complications. Image printed for medical record.The patient tolerated the procedure well.

## 2019-12-02 NOTE — Anesthesia Preprocedure Evaluation (Signed)
Anesthesia Evaluation  Patient identified by MRN, date of birth, ID band Patient awake    Reviewed: Allergy & Precautions, NPO status , Patient's Chart, lab work & pertinent test results  Airway Mallampati: I  TM Distance: >3 FB Neck ROM: Full    Dental   Pulmonary former smoker,    Pulmonary exam normal        Cardiovascular hypertension, Pt. 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   Neuro/Psych    GI/Hepatic   Endo/Other    Renal/GU      Musculoskeletal   Abdominal   Peds  Hematology   Anesthesia Other Findings   Reproductive/Obstetrics                             Anesthesia Physical Anesthesia Plan  ASA: III  Anesthesia Plan: General   Post-op Pain Management:  Regional for Post-op pain   Induction: Intravenous  PONV Risk Score and Plan: 2 and Ondansetron and Midazolam  Airway Management Planned: Oral ETT  Additional Equipment:   Intra-op Plan:   Post-operative Plan: Extubation in OR  Informed Consent: I have reviewed the patients History and Physical, chart, labs and discussed the procedure including the risks, benefits and alternatives for the proposed anesthesia with the patient or authorized representative who has indicated his/her understanding and acceptance.       Plan Discussed with: CRNA and Surgeon  Anesthesia Plan Comments:         Anesthesia Quick Evaluation

## 2019-12-02 NOTE — Transfer of Care (Signed)
Immediate Anesthesia Transfer of Care Note  Patient: [name] [name]  Procedure(s) Performed: REPAIR LEFT INGUINAL HERNIA WITH MESH (Left Inguinal) INSERTION OF MESH (Left Inguinal)  Patient Location: PACU  Anesthesia Type:General  Level of Consciousness: drowsy  Airway & Oxygen Therapy: Patient Spontanous Breathing and Patient connected to face mask oxygen  Post-op Assessment: Report given to RN and Post -op Vital signs reviewed and stable  Post vital signs: Reviewed and stable  Last Vitals:  Vitals Value Taken Time  BP 162/67 12/02/19 1615  Temp 36.4 C 12/02/19 1615  Pulse 81 12/02/19 1617  Resp 16 12/02/19 1617  SpO2 100 % 12/02/19 1617  Vitals shown include unvalidated device data.  Last Pain:  Vitals:   12/02/19 1615  TempSrc:   PainSc: (P) Asleep         Complications: No complications documented.

## 2019-12-02 NOTE — Progress Notes (Signed)
° °  Subjective/Chief Complaint: No SOB Hernia the same   Objective: Vital signs in last 24 hours: Temp:  [98 F (36.7 C)-99.2 F (37.3 C)] 98.6 F (37 C) (06/15 0807) Pulse Rate:  [73-90] 90 (06/15 0807) Resp:  [18-20] 20 (06/15 0807) BP: (101-164)/(49-71) 122/70 (06/15 0807) SpO2:  [93 %-99 %] 97 % (06/15 0807) Weight:  [79.9 kg] 79.9 kg (06/15 0057) Last BM Date: 12/01/19  Intake/Output from previous day: 06/14 0701 - 06/15 0700 In: 180 [P.O.:180] Out: 600 [Urine:600] Intake/Output this shift: No intake/output data recorded.  General appearance: cooperative Resp: few rales Cardio: irregularly irregular rhythm GI: soft, NT, incarcerated LIH Extremities: min edema  Lab Results:  Recent Labs    12/02/19 0506  WBC 5.4  HGB 10.1*  HCT 32.7*  PLT 174   BMET Recent Labs    12/02/19 0506  NA 139  K 3.9  CL 101  CO2 28  GLUCOSE 95  BUN 23  CREATININE 1.13  CALCIUM 8.7*   PT/INR No results for input(s): LABPROT, INR in the last 72 hours. ABG No results for input(s): PHART, HCO3 in the last 72 hours.  Invalid input(s): PCO2, PO2  Studies/Results: DG Chest Port 1 View  Result Date: 12/02/2019 CLINICAL DATA:  Pleural effusions. EXAM: PORTABLE CHEST 1 VIEW COMPARISON:  Chest x-ray 4/16/1. FINDINGS: Heart is enlarged. Aortic stent graft noted. CABG. No edema is present. Previously noted pleural effusions have resolved. AC joint arthropathy is noted. IMPRESSION: 1. Cardiomegaly without failure. 2. Interval resolution of bilateral pleural effusions. Electronically Signed   By: Marin Roberts M.D.   On: 12/02/2019 08:40    Anti-infectives: Anti-infectives (From admission, onward)   Start     Dose/Rate Route Frequency Ordered Stop   12/02/19 0600  ceFAZolin (ANCEF) IVPB 2g/100 mL premix     Discontinue     2 g 200 mL/hr over 30 Minutes Intravenous On call to O.R. 12/01/19 1553 12/03/19 0559      Assessment/Plan: Large incarcerated left inguinal  hernia -for repair today. I again discussed the procedure, risks, benefits, and expected postoperative course.  He has a significant risk of cardiac and pulmonary complications which we again have discussed in detail. I marked his site.  CAD, atrial fibrillation, CHF -Eliquis on hold for surgery, appreciate Cardiology eval and management  Hypokalemia - mild, replete   LOS: 1 day    Liz Malady 12/02/2019

## 2019-12-03 ENCOUNTER — Encounter (HOSPITAL_COMMUNITY): Payer: Self-pay | Admitting: General Surgery

## 2019-12-03 DIAGNOSIS — I471 Supraventricular tachycardia: Secondary | ICD-10-CM

## 2019-12-03 DIAGNOSIS — I428 Other cardiomyopathies: Secondary | ICD-10-CM

## 2019-12-03 MED ORDER — TRAMADOL HCL 50 MG PO TABS: 50.0000 mg | ORAL_TABLET | Freq: Four times a day (QID) | ORAL | 0 refills | Status: DC | PRN

## 2019-12-03 NOTE — Progress Notes (Signed)
1 Day Post-Op   Subjective/Chief Complaint: Slept well Pain control good Urinated fine   Objective: Vital signs in last 24 hours: Temp:  [97.6 F (36.4 C)-98.7 F (37.1 C)] 98.6 F (37 C) (06/16 0326) Pulse Rate:  [48-92] 68 (06/16 0608) Resp:  [13-20] 16 (06/16 0326) BP: (93-171)/(38-70) 117/51 (06/16 0608) SpO2:  [92 %-100 %] 94 % (06/16 0326) Weight:  [78.2 kg-79.9 kg] 78.2 kg (06/16 0326) Last BM Date: 12/01/19  Intake/Output from previous day: 06/15 0701 - 06/16 0700 In: 570 [P.O.:120; I.V.:450] Out: 505 [Urine:500; Blood:5] Intake/Output this shift: Total I/O In: -  Out: 300 [Urine:300]  General appearance: alert and cooperative Resp: clear to auscultation bilaterally Cardio: irregularly irregular rhythm GI: soft, NT Male genitalia: L groin incision CDI, hernia repair intact  Lab Results:  Recent Labs    12/02/19 0506  WBC 5.4  HGB 10.1*  HCT 32.7*  PLT 174   BMET Recent Labs    12/02/19 0506  NA 139  K 3.9  CL 101  CO2 28  GLUCOSE 95  BUN 23  CREATININE 1.13  CALCIUM 8.7*   PT/INR No results for input(s): LABPROT, INR in the last 72 hours. ABG No results for input(s): PHART, HCO3 in the last 72 hours.  Invalid input(s): PCO2, PO2  Studies/Results: DG Chest Port 1 View  Result Date: 12/02/2019 CLINICAL DATA:  Pleural effusions. EXAM: PORTABLE CHEST 1 VIEW COMPARISON:  Chest x-ray 4/16/1. FINDINGS: Heart is enlarged. Aortic stent graft noted. CABG. No edema is present. Previously noted pleural effusions have resolved. AC joint arthropathy is noted. IMPRESSION: 1. Cardiomegaly without failure. 2. Interval resolution of bilateral pleural effusions. Electronically Signed   By: Marin Roberts M.D.   On: 12/02/2019 08:40    Anti-infectives: Anti-infectives (From admission, onward)   Start     Dose/Rate Route Frequency Ordered Stop   12/02/19 0600  ceFAZolin (ANCEF) IVPB 2g/100 mL premix        2 g 200 mL/hr over 30 Minutes Intravenous  On call to O.R. 12/01/19 1553 12/03/19 0559      Assessment/Plan: POD#1 S/P repair incarcerated left inguinal hernia  Doing well  PT/OT  CAD,atrial fibrillation,CHF- resume Eliquis tomorrow, Cardiology F/U anticipated today  Dispo - home late today vs tomorrow AM     LOS: 2 days    Liz Malady 12/03/2019

## 2019-12-03 NOTE — Evaluation (Signed)
Physical Therapy Evaluation Patient Details Name: [name] [name] MRN: [id number] DOB: [birth year] Today's Date: 12/03/2019   History of Present Illness  79 y.o. male with complex medical history including chronic systolic and diastolic heart failure, nonischemic cardiomyopathy, hypertension, atrial flutter, moderate pulmonary hypertension, [place]/AVR 2010,  proximal descending aortic dissection s/p stent, carotid-subclavian bypass, CAD with DES to LCx 2011 and recent cath with occluded mRCA with collaterals. He was seen by cardiology for perioperative management around hernia repair surgery.79 y.o. male with complex medical history including chronic systolic and diastolic heart failure, nonischemic cardiomyopathy, hypertension, atrial flutter, moderate pulmonary hypertension, [place]/AVR 2010,  proximal descending aortic dissection s/p stent, carotid-subclavian bypass, CAD with DES to LCx 2011 and recent cath with occluded mRCA with collaterals. He was seen by cardiology for perioperative management around hernia repair surgery.  Repair of incarcerated inguinal hernia 6/15.    Clinical Impression  Pt admitted with above diagnosis. Pt was able to ambulate in hallway with RW with good stability. Should progress well. Pt currently with functional limitations due to the deficits listed below (see PT Problem List). Pt will benefit from skilled PT to increase their independence and safety with mobility to allow discharge to the venue listed below.    Follow Up Recommendations Home health PT;Supervision/Assistance - 24 hour (HHOT)    Equipment Recommendations  None recommended by PT    Recommendations for Other Services       Precautions / Restrictions Precautions Precautions: Fall Restrictions Weight Bearing Restrictions: No      Mobility  Bed Mobility Overal bed mobility: Independent                Transfers Overall transfer level: Independent                   Ambulation/Gait Ambulation/Gait assistance: Supervision Gait Distance (Feet): 480 Feet Assistive device: Rolling walker (2 wheeled) Gait Pattern/deviations: Step-through pattern;Decreased stride length;Trunk flexed   Gait velocity interpretation: 1.31 - 2.62 ft/sec, indicative of limited community ambulator General Gait Details: Pt with good stability with RW. No LOB.   Stairs            Wheelchair Mobility    Modified Rankin (Stroke Patients Only)       Balance   Sitting-balance support: No upper extremity supported;Feet supported Sitting balance-Leahy Scale: Fair     Standing balance support: No upper extremity supported;During functional activity Standing balance-Leahy Scale: Fair Standing balance comment: can stand statically without UE support.                              Pertinent Vitals/Pain Pain Assessment: No/denies pain    Home Living Family/patient expects to be discharged to:: Private residence Living Arrangements: Spouse/significant other Available Help at Discharge: Family;Available 24 hours/day Type of Home: Mobile home Home Access: Stairs to enter Entrance Stairs-Rails: Right Entrance Stairs-Number of Steps: 3 Home Layout: One level Home Equipment: Walker - 2 wheels;Cane - single point;Shower seat;Toilet riser;Grab bars - tub/shower;Hand held shower head      Prior Function Level of Independence: Independent with assistive device(s)         Comments: used cane prior to admit; B/D self normally but family has been assisting lately.      Hand Dominance   Dominant Hand: Right    Extremity/Trunk Assessment   Upper Extremity Assessment Upper Extremity Assessment: Defer to OT evaluation    Lower Extremity Assessment Lower Extremity Assessment: Generalized weakness  Cervical / Trunk Assessment Cervical / Trunk Assessment: Kyphotic  Communication   Communication: No difficulties  Cognition Arousal/Alertness:  Awake/alert Behavior During Therapy: WFL for tasks assessed/performed Overall Cognitive Status: Within Functional Limits for tasks assessed                                        General Comments      Exercises General Exercises - Lower Extremity Ankle Circles/Pumps: AROM;Both;10 reps;Supine Long Arc Quad: AROM;Both;10 reps;Seated   Assessment/Plan    PT Assessment Patient needs continued PT services  PT Problem List Decreased balance;Decreased mobility;Decreased activity tolerance;Decreased knowledge of use of DME;Decreased safety awareness;Decreased knowledge of precautions       PT Treatment Interventions DME instruction;Gait training;Functional mobility training;Therapeutic activities;Therapeutic exercise;Balance training;Stair training;Patient/family education    PT Goals (Current goals can be found in the Care Plan section)  Acute Rehab PT Goals Patient Stated Goal: to go home PT Goal Formulation: With patient Time For Goal Achievement: 12/17/19 Potential to Achieve Goals: Good    Frequency Min 3X/week   Barriers to discharge        Co-evaluation               AM-PAC PT "6 Clicks" Mobility  Outcome Measure Help needed turning from your back to your side while in a flat bed without using bedrails?: None Help needed moving from lying on your back to sitting on the side of a flat bed without using bedrails?: None Help needed moving to and from a bed to a chair (including a wheelchair)?: None Help needed standing up from a chair using your arms (e.g., wheelchair or bedside chair)?: A Little Help needed to walk in hospital room?: A Little Help needed climbing 3-5 steps with a railing? : A Little 6 Click Score: 21    End of Session Equipment Utilized During Treatment: Gait belt Activity Tolerance: Patient limited by fatigue Patient left: in chair;with call bell/phone within reach;with chair alarm set Nurse Communication: Mobility status PT  Visit Diagnosis: Muscle weakness (generalized) (M62.81)    Time: 0973-5329 PT Time Calculation (min) (ACUTE ONLY): 19 min   Charges:   PT Evaluation $PT Eval Moderate Complexity: 1 Mod          [name] [name],PT Acute Rehabilitation Services Pager:  [phone]  Office:  [phone]    Berline Lopes 12/03/2019, 12:15 PM

## 2019-12-03 NOTE — Anesthesia Postprocedure Evaluation (Signed)
Anesthesia Post Note  Patient: [name] [name]  Procedure(s) Performed: REPAIR LEFT INGUINAL HERNIA WITH MESH (Left Inguinal) INSERTION OF MESH (Left Inguinal)     Patient location during evaluation: PACU Anesthesia Type: General Level of consciousness: awake and alert Pain management: pain level controlled Vital Signs Assessment: post-procedure vital signs reviewed and stable Respiratory status: spontaneous breathing, nonlabored ventilation, respiratory function stable and patient connected to nasal cannula oxygen Cardiovascular status: blood pressure returned to baseline and stable Postop Assessment: no apparent nausea or vomiting Anesthetic complications: no   No complications documented.  Last Vitals:  Vitals:   12/03/19 0005 12/03/19 0326  BP: 103/64 (!) 93/57  Pulse: 76 83  Resp: 14 16  Temp: 37.1 C 37 C  SpO2: 92% 94%    Last Pain:  Vitals:   12/03/19 0326  TempSrc: Oral  PainSc:                  [name] DAVID

## 2019-12-03 NOTE — Evaluation (Signed)
Occupational Therapy Evaluation Patient Details Name: [name] [name] MRN: [id number] DOB: 26-Aug-1940 Today's Date: 12/03/2019    History of Present Illness 79 y.o. male with complex medical history including chronic systolic and diastolic heart failure, nonischemic cardiomyopathy, hypertension, atrial flutter, moderate pulmonary hypertension, [place]/AVR 2010,  proximal descending aortic dissection s/p stent, carotid-subclavian bypass, CAD with DES to LCx 2011 and recent cath with occluded mRCA with collaterals. He was seen by cardiology for perioperative management around hernia repair surgery.79 y.o. male with complex medical history including chronic systolic and diastolic heart failure, nonischemic cardiomyopathy, hypertension, atrial flutter, moderate pulmonary hypertension, [place]/AVR 2010,  proximal descending aortic dissection s/p stent, carotid-subclavian bypass, CAD with DES to LCx 2011 and recent cath with occluded mRCA with collaterals. He was seen by cardiology for perioperative management around hernia repair surgery.  Repair of incarcerated inguinal hernia 6/15.     Clinical Impression   This 79 y/o male presents with the above. PTA pt living with spouse, is mod independent with functional mobility, reports spouse assists with LB ADL. Pt pleasant and willing to participate in therapy session, tolerating standing grooming ADL, room and short distance hallway mobility using RW at minguard assist level. Pt requiring at least modA for LB ADL (spouse typically assists with LB ADL, reports plans to continue assisting PRN after discharge). Max HR noted 116 with activity. Pt with good family support and has necessary DME for ADL tasks after return home. He will benefit from continued acute OT services to maximize his overall endurance, safety and independence with ADL and mobility tasks but do not anticipate pt will require follow up OT services post discharge.     Follow Up Recommendations   No OT follow up;Supervision/Assistance - 24 hour (24hr initially)    Equipment Recommendations  None recommended by OT (pt's DME needs are met )           Precautions / Restrictions Precautions Precautions: Fall Restrictions Weight Bearing Restrictions: No      Mobility Bed Mobility Overal bed mobility: Modified Independent             General bed mobility comments: HOB slightly elevated   Transfers Overall transfer level: Needs assistance Equipment used: Rolling walker (2 wheeled) Transfers: Sit to/from Stand Sit to Stand: Supervision         General transfer comment: for balance and safety, pt with good hand placement with transitions     Balance Overall balance assessment: Needs assistance Sitting-balance support: Feet supported Sitting balance-Leahy Scale: Good     Standing balance support: Bilateral upper extremity supported;During functional activity Standing balance-Leahy Scale: Fair Standing balance comment: can stand statically without UE support.                            ADL either performed or assessed with clinical judgement   ADL Overall ADL's : Needs assistance/impaired Eating/Feeding: Set up;Sitting   Grooming: Min guard;Standing;Wash/dry hands Grooming Details (indicate cue type and reason): standing at sink in room  Upper Body Bathing: Supervision/ safety;Sitting   Lower Body Bathing: Minimal assistance;Sitting/lateral leans;Sit to/from stand   Upper Body Dressing : Set up;Supervision/safety;Sitting   Lower Body Dressing: Moderate assistance;Sitting/lateral leans;Sit to/from stand Lower Body Dressing Details (indicate cue type and reason): pt's spouse typically assists with LB ADL due to pt not able to easily reach his feet  Toilet Transfer: Min guard;Ambulation;RW Toilet Transfer Details (indicate cue type and reason): simulated via transfer EOB to recliner,  room/hallway level mobility  Toileting- Clothing Manipulation  and Hygiene: Minimal assistance;Sitting/lateral lean;Sit to/from stand       Functional mobility during ADLs: Min guard;Minimal assistance;Rolling walker       Vision         Perception     Praxis      Pertinent Vitals/Pain Pain Assessment: No/denies pain     Hand Dominance Right   Extremity/Trunk Assessment Upper Extremity Assessment Upper Extremity Assessment: Generalized weakness   Lower Extremity Assessment Lower Extremity Assessment: Defer to PT evaluation   Cervical / Trunk Assessment Cervical / Trunk Assessment: Kyphotic   Communication Communication Communication: No difficulties   Cognition Arousal/Alertness: Awake/alert Behavior During Therapy: WFL for tasks assessed/performed Overall Cognitive Status: Within Functional Limits for tasks assessed                                     General Comments  pt's family present and supportive during session ; HR up to 116 with activity     Exercises Exercises: General Lower Extremity General Exercises - Lower Extremity Ankle Circles/Pumps: AROM;Both;10 reps;Supine Long Arc Quad: AROM;Both;10 reps;Seated   Shoulder Instructions      Home Living Family/patient expects to be discharged to:: Private residence Living Arrangements: Spouse/significant other Available Help at Discharge: Family;Available 24 hours/day Type of Home: Mobile home Home Access: Stairs to enter Entrance Stairs-Number of Steps: 3 Entrance Stairs-Rails: Right Home Layout: One level     Bathroom Shower/Tub: Occupational psychologist: Standard     Home Equipment: Environmental consultant - 2 wheels;Cane - single point;Shower seat;Toilet riser;Grab bars - tub/shower;Hand held shower head          Prior Functioning/Environment Level of Independence: Independent with assistive device(s);Needs assistance    ADL's / Homemaking Assistance Needed: spouse assisting with LB ADL   Comments: used cane prior to admit; B/D self  normally but family has been assisting lately.         OT Problem List: Decreased strength;Decreased range of motion;Decreased activity tolerance;Impaired balance (sitting and/or standing);Decreased knowledge of use of DME or AE;Decreased safety awareness;Pain;Cardiopulmonary status limiting activity      OT Treatment/Interventions: Self-care/ADL training;Therapeutic exercise;Energy conservation;DME and/or AE instruction;Therapeutic activities;Patient/family education;Balance training    OT Goals(Current goals can be found in the care plan section) Acute Rehab OT Goals Patient Stated Goal: to go home OT Goal Formulation: With patient Time For Goal Achievement: 12/17/19 Potential to Achieve Goals: Good  OT Frequency: Min 2X/week   Barriers to D/C:            Co-evaluation              AM-PAC OT "6 Clicks" Daily Activity     Outcome Measure Help from another person eating meals?: None Help from another person taking care of personal grooming?: A Little Help from another person toileting, which includes using toliet, bedpan, or urinal?: A Little Help from another person bathing (including washing, rinsing, drying)?: A Lot Help from another person to put on and taking off regular upper body clothing?: A Little Help from another person to put on and taking off regular lower body clothing?: A Lot 6 Click Score: 17   End of Session Equipment Utilized During Treatment: Rolling walker Nurse Communication: Mobility status  Activity Tolerance: Patient tolerated treatment well Patient left: in chair;with call bell/phone within reach;with family/visitor present  OT Visit Diagnosis: Other abnormalities of gait and  mobility (R26.89);Muscle weakness (generalized) (M62.81)                Time: 0347-4259 OT Time Calculation (min): 18 min Charges:  OT General Charges $OT Visit: 1 Visit OT Evaluation $OT Eval Moderate Complexity: [place], OT Acute Rehabilitation  Services Pager [phone] Office [phone]   Raymondo Band 12/03/2019, 2:20 PM

## 2019-12-03 NOTE — Progress Notes (Signed)
Progress Note  Patient Name: [name] [name] Date of Encounter: 12/03/2019  [facility] Cardiologist: Lance Muss, MD   Subjective   Did well with surgery yesterday. Has already been up and walking the halls. Breathing stable. Pain well controlled. He hopes he can go home today.  Inpatient Medications    Scheduled Meds: . amiodarone  200 mg Oral Daily  . atorvastatin  10 mg Oral Daily  . Chlorhexidine Gluconate Cloth  6 each Topical Once  . docusate sodium  100 mg Oral BID  . ferrous sulfate  325 mg Oral Q breakfast  . loratadine  10 mg Oral Daily  . losartan  25 mg Oral Daily  . metoprolol succinate  100 mg Oral Daily  . pantoprazole  40 mg Oral Daily  . potassium chloride SA  40 mEq Oral Daily  . spironolactone  12.5 mg Oral Daily   Continuous Infusions:  PRN Meds: acetaminophen, methocarbamol, ondansetron (ZOFRAN) IV, traMADol, traMADol   Vital Signs    Vitals:   12/03/19 0005 12/03/19 0326 12/03/19 0608 12/03/19 0728  BP: 103/64 (!) 93/57 (!) 117/51 (!) 98/59  Pulse: 76 83 68 79  Resp: 14 16  16   Temp: 98.7 F (37.1 C) 98.6 F (37 C)  98.3 F (36.8 C)  TempSrc: Oral Oral  Oral  SpO2: 92% 94%  94%  Weight:  78.2 kg    Height:        Intake/Output Summary (Last 24 hours) at 12/03/2019 1149 Last data filed at 12/03/2019 0819 Gross per 24 hour  Intake 750 ml  Output 505 ml  Net 245 ml   Last 3 Weights 12/03/2019 12/02/2019 12/02/2019  Weight (lbs) 172 lb 6.4 oz 176 lb 2.4 oz 176 lb 3.2 oz  Weight (kg) 78.2 kg 79.9 kg 79.924 kg      Telemetry    Irregular, difficult to assess flutter vs. atach as atrial waves not well see - Personally Reviewed  ECG    Atrial tachycardia, with atrial rate of 150 bpm, with irregular ventricular rate averaging 80s with irregular atrial-ventricular conduction pattern - Personally Reviewed  Physical Exam   GEN: No acute distress.  Sitting up comfortably in chair. Neck: No JVD at 90 degrees Cardiac:  irregularly irregular S1 and S2, 2/6 SE murmur, no rubs or gallops.  Respiratory: Clear to auscultation bilaterally. GI: Soft, nontender, non-distended  MS: No edema; No deformity. Neuro:  Nonfocal  Psych: Normal affect   Labs    High Sensitivity Troponin:  No results for input(s): TROPONINIHS in the last 720 hours.    Chemistry Recent Labs  Lab 12/02/19 0506  NA 139  K 3.9  CL 101  CO2 28  GLUCOSE 95  BUN 23  CREATININE 1.13  CALCIUM 8.7*  GFRNONAA >60  GFRAA >60  ANIONGAP 10     Hematology Recent Labs  Lab 12/02/19 0506  WBC 5.4  RBC 3.35*  HGB 10.1*  HCT 32.7*  MCV 97.6  MCH 30.1  MCHC 30.9  RDW 20.3*  PLT 174    BNPNo results for input(s): BNP, PROBNP in the last 168 hours.   DDimer No results for input(s): DDIMER in the last 168 hours.   Radiology    DG Chest Port 1 View  Result Date: 12/02/2019 CLINICAL DATA:  Pleural effusions. EXAM: PORTABLE CHEST 1 VIEW COMPARISON:  Chest x-ray 4/16/1. FINDINGS: Heart is enlarged. Aortic stent graft noted. CABG. No edema is present. Previously noted pleural effusions have resolved. AC joint  arthropathy is noted. IMPRESSION: 1. Cardiomegaly without failure. 2. Interval resolution of bilateral pleural effusions. Electronically Signed   By: San Morelle M.D.   On: 12/02/2019 08:40    Cardiac Studies   Prior reviewed, none this admission  Patient Profile     79 y.o. male with complex medical history including chronic systolic and diastolic heart failure, nonischemic cardiomyopathy, hypertension, atrial flutter, moderate pulmonary hypertension, [place]/AVR 2010,  proximal descending aortic dissection s/p stent, carotid-subclavian bypass, CAD with DES to LCx 2011 and recent cath with occluded mRCA with collaterals. He was seen by cardiology for perioperative management around hernia repair surgery.  Assessment & Plan    Chronic systolic and diastolic heart failure, nonischemic cardiomyopathy: -appears  euvolemic today -ambulating halls -would return to home medications at discharge, including furosemide, losartan, metoprolol succinate, spironolactone  History of atrial flutter, with atrial tachycardia on admission: -appears to be more of an atrial tachycardia vs. Atypical flutter as atrial rate consistently ~150. Irregular ventricular conduction, with atrial-ventricular interval not consistent. -continue amiodarone -restart apixaban when clear from surgical standpoint  PAD/CAD -continue atorvastatin  [facility] will sign off.   Medication Recommendations:  Return to home medications. Restart apixaban when safe from surgical standpoint Other recommendations (labs, testing, etc):  none Follow up as an outpatient:  Has follow up with Dr. Irish Lack on 01/15/20  For questions or updates, please contact [facility] Please consult [email or web address] for contact info under     Signed, Buford Dresser, MD  12/03/2019, 11:49 AM

## 2019-12-09 ENCOUNTER — Other Ambulatory Visit: Payer: Medicare Other

## 2019-12-09 NOTE — Discharge Summary (Signed)
Physician Discharge Summary  Patient ID: [name] [name] MRN: [id number] DOB/AGE: Feb 03, 1941 79 y.o.  Admit date: 12/01/2019 Discharge date: 12/09/2019  Admission Diagnoses: Chronically incarcerated left inguinal hernia  Discharge Diagnoses: Status post repair of chronically incarcerated left inguinal hernia Active Problems:   Incarcerated left inguinal hernia   Discharged Condition: Stable  Hospital Course: Per recommendation of cardiology, patient was admitted preoperatively for diuresis and cardiac optimization.  He subsequently underwent repair of chronically incarcerated left inguinal hernia with mesh.  He did very well.  He mobilized with physical therapy the following day and was discharged.  Consults: Cardiology  Treatments: Surgery  Discharge Exam: Blood pressure (!) 112/46, pulse 80, temperature 98.5 F (36.9 C), temperature source Oral, resp. rate 18, height 5\' 10"  (1.778 m), weight 78.2 kg, SpO2 93 %. See progress note day of discharge Disposition: Discharge disposition: 01-Home or Self Care       Discharge Instructions    Call MD for:  redness, tenderness, or signs of infection (pain, swelling, redness, odor or green/yellow discharge around incision site)   Complete by: As directed    Diet - low sodium heart healthy   Complete by: As directed    Increase activity slowly   Complete by: As directed      Allergies as of 12/03/2019      Reactions   Lortab [hydrocodone-acetaminophen] Hives   No trouble breathing   Morphine And Related Hives   Other Other (See Comments)   Staples from surgery caused infection      Medication List    TAKE these medications   acetaminophen 500 MG tablet Commonly known as: TYLENOL Take 2 tablets (1,000 mg total) by mouth every 6 (six) hours as needed. What changed: reasons to take this   amiodarone 200 MG tablet Commonly known as: PACERONE Take 1 tablet (200 mg total) by mouth daily.   apixaban 5 MG Tabs  tablet Commonly known as: Eliquis Take 1 tablet (5 mg total) by mouth 2 (two) times daily.   atorvastatin 10 MG tablet Commonly known as: LIPITOR Take 1 tablet by mouth once daily   ferrous sulfate 325 (65 FE) MG tablet Take 1 tablet (325 mg total) by mouth daily with breakfast.   furosemide 40 MG tablet Commonly known as: LASIX Take 1 tablet by mouth twice daily What changed: when to take this   lansoprazole 30 MG capsule Commonly known as: PREVACID Take 30 mg by mouth daily.   loratadine 10 MG tablet Commonly known as: CLARITIN Take 1 tablet (10 mg total) by mouth daily.   losartan 25 MG tablet Commonly known as: COZAAR Take 1 tablet (25 mg total) by mouth daily.   methocarbamol 500 MG tablet Commonly known as: ROBAXIN Take 2 tablets (1,000 mg total) by mouth every 8 (eight) hours as needed for muscle spasms.   metoprolol succinate 100 MG 24 hr tablet Commonly known as: TOPROL-XL Take 1 tablet (100 mg total) by mouth daily. Take with or immediately following a meal.   multivitamin with minerals Tabs tablet Take 1 tablet by mouth daily.   nitroGLYCERIN 0.4 MG SL tablet Commonly known as: NITROSTAT Place 1 tablet (0.4 mg total) under the tongue every 5 (five) minutes as needed for chest pain (X3 DOSES BEFORE CALLING 911).   potassium chloride SA 20 MEQ tablet Commonly known as: KLOR-CON Take 2 tablets by mouth once daily   spironolactone 25 MG tablet Commonly known as: ALDACTONE Take 0.5 tablets (12.5 mg total) by mouth daily.  traMADol 50 MG tablet Commonly known as: ULTRAM Take 1 tablet (50 mg total) by mouth every 6 (six) hours as needed for severe pain.       Follow-up Information    Violeta Gelinas, MD Follow up.   Specialty: General Surgery Why: as scheduled Contact information: [address] [place] [place] [address] [phone]               Signed: Liz Malady 12/09/2019, 6:38 AM

## 2019-12-11 ENCOUNTER — Other Ambulatory Visit: Payer: Medicare Other

## 2019-12-11 ENCOUNTER — Ambulatory Visit: Payer: Medicare Other | Admitting: Cardiothoracic Surgery

## 2019-12-16 ENCOUNTER — Encounter (HOSPITAL_COMMUNITY): Payer: Self-pay

## 2019-12-16 ENCOUNTER — Emergency Department (HOSPITAL_COMMUNITY): Payer: Medicare Other

## 2019-12-16 ENCOUNTER — Other Ambulatory Visit: Payer: Self-pay

## 2019-12-16 ENCOUNTER — Inpatient Hospital Stay (HOSPITAL_COMMUNITY)
Admission: EM | Admit: 2019-12-16 | Discharge: 2019-12-22 | DRG: 330 | Disposition: A | Discharge status: Home Health | Payer: Medicare Other | Source: Ambulatory Visit | Attending: Internal Medicine | Admitting: Internal Medicine

## 2019-12-16 DIAGNOSIS — K9172 Accidental puncture and laceration of a digestive system organ or structure during other procedure: Secondary | ICD-10-CM | POA: Diagnosis not present

## 2019-12-16 DIAGNOSIS — K4031 Unilateral inguinal hernia, with obstruction, without gangrene, recurrent: Secondary | ICD-10-CM | POA: Diagnosis not present

## 2019-12-16 DIAGNOSIS — Z79899 Other long term (current) drug therapy: Secondary | ICD-10-CM

## 2019-12-16 DIAGNOSIS — E860 Dehydration: Secondary | ICD-10-CM | POA: Diagnosis present

## 2019-12-16 DIAGNOSIS — I4892 Unspecified atrial flutter: Secondary | ICD-10-CM | POA: Diagnosis present

## 2019-12-16 DIAGNOSIS — Z96653 Presence of artificial knee joint, bilateral: Secondary | ICD-10-CM | POA: Diagnosis present

## 2019-12-16 DIAGNOSIS — Z885 Allergy status to narcotic agent status: Secondary | ICD-10-CM

## 2019-12-16 DIAGNOSIS — I5042 Chronic combined systolic (congestive) and diastolic (congestive) heart failure: Secondary | ICD-10-CM | POA: Diagnosis present

## 2019-12-16 DIAGNOSIS — I4819 Other persistent atrial fibrillation: Secondary | ICD-10-CM | POA: Diagnosis not present

## 2019-12-16 DIAGNOSIS — K4030 Unilateral inguinal hernia, with obstruction, without gangrene, not specified as recurrent: Secondary | ICD-10-CM

## 2019-12-16 DIAGNOSIS — K9171 Accidental puncture and laceration of a digestive system organ or structure during a digestive system procedure: Secondary | ICD-10-CM | POA: Diagnosis not present

## 2019-12-16 DIAGNOSIS — N179 Acute kidney failure, unspecified: Secondary | ICD-10-CM | POA: Diagnosis not present

## 2019-12-16 DIAGNOSIS — D638 Anemia in other chronic diseases classified elsewhere: Secondary | ICD-10-CM | POA: Diagnosis present

## 2019-12-16 DIAGNOSIS — Z20822 Contact with and (suspected) exposure to covid-19: Secondary | ICD-10-CM | POA: Diagnosis not present

## 2019-12-16 DIAGNOSIS — I2582 Chronic total occlusion of coronary artery: Secondary | ICD-10-CM | POA: Diagnosis present

## 2019-12-16 DIAGNOSIS — Z87891 Personal history of nicotine dependence: Secondary | ICD-10-CM

## 2019-12-16 DIAGNOSIS — R0989 Other specified symptoms and signs involving the circulatory and respiratory systems: Secondary | ICD-10-CM

## 2019-12-16 DIAGNOSIS — I4891 Unspecified atrial fibrillation: Secondary | ICD-10-CM | POA: Diagnosis present

## 2019-12-16 DIAGNOSIS — Y838 Other surgical procedures as the cause of abnormal reaction of the patient, or of later complication, without mention of misadventure at the time of the procedure: Secondary | ICD-10-CM | POA: Diagnosis not present

## 2019-12-16 DIAGNOSIS — I5032 Chronic diastolic (congestive) heart failure: Secondary | ICD-10-CM | POA: Diagnosis present

## 2019-12-16 DIAGNOSIS — I2510 Atherosclerotic heart disease of native coronary artery without angina pectoris: Secondary | ICD-10-CM | POA: Diagnosis present

## 2019-12-16 DIAGNOSIS — I255 Ischemic cardiomyopathy: Secondary | ICD-10-CM | POA: Diagnosis present

## 2019-12-16 DIAGNOSIS — I959 Hypotension, unspecified: Secondary | ICD-10-CM | POA: Diagnosis present

## 2019-12-16 DIAGNOSIS — E876 Hypokalemia: Secondary | ICD-10-CM | POA: Diagnosis not present

## 2019-12-16 DIAGNOSIS — Z8249 Family history of ischemic heart disease and other diseases of the circulatory system: Secondary | ICD-10-CM

## 2019-12-16 DIAGNOSIS — E785 Hyperlipidemia, unspecified: Secondary | ICD-10-CM | POA: Diagnosis present

## 2019-12-16 DIAGNOSIS — Z952 Presence of prosthetic heart valve: Secondary | ICD-10-CM

## 2019-12-16 DIAGNOSIS — Z7901 Long term (current) use of anticoagulants: Secondary | ICD-10-CM

## 2019-12-16 DIAGNOSIS — E870 Hyperosmolality and hypernatremia: Secondary | ICD-10-CM | POA: Diagnosis not present

## 2019-12-16 DIAGNOSIS — Z95820 Peripheral vascular angioplasty status with implants and grafts: Secondary | ICD-10-CM

## 2019-12-16 DIAGNOSIS — I110 Hypertensive heart disease with heart failure: Secondary | ICD-10-CM | POA: Diagnosis present

## 2019-12-16 DIAGNOSIS — Z8379 Family history of other diseases of the digestive system: Secondary | ICD-10-CM

## 2019-12-16 LAB — COMPREHENSIVE METABOLIC PANEL
ALT: 24 U/L (ref 0–44)
AST: 34 U/L (ref 15–41)
Albumin: 4 g/dL (ref 3.5–5.0)
Alkaline Phosphatase: 56 U/L (ref 38–126)
Anion gap: 13 (ref 5–15)
BUN: 41 mg/dL — ABNORMAL HIGH (ref 8–23)
CO2: 23 mmol/L (ref 22–32)
Calcium: 9.4 mg/dL (ref 8.9–10.3)
Chloride: 108 mmol/L (ref 98–111)
Creatinine, Ser: 1.72 mg/dL — ABNORMAL HIGH (ref 0.61–1.24)
GFR calc Af Amer: 43 mL/min — ABNORMAL LOW (ref >60)
GFR calc non Af Amer: 37 mL/min — ABNORMAL LOW (ref >60)
Glucose, Bld: 114 mg/dL — ABNORMAL HIGH (ref 70–99)
Potassium: 4.4 mmol/L (ref 3.5–5.1)
Sodium: 144 mmol/L (ref 135–145)
Total Bilirubin: 0.7 mg/dL (ref 0.3–1.2)
Total Protein: 7.5 g/dL (ref 6.5–8.1)

## 2019-12-16 LAB — LIPASE, BLOOD: Lipase: 28 U/L (ref 11–51)

## 2019-12-16 LAB — CBC
HCT: 39.1 % (ref 39.0–52.0)
Hemoglobin: 11.8 g/dL — ABNORMAL LOW (ref 13.0–17.0)
MCH: 30.3 pg (ref 26.0–34.0)
MCHC: 30.2 g/dL (ref 30.0–36.0)
MCV: 100.5 fL — ABNORMAL HIGH (ref 80.0–100.0)
Platelets: 369 10*3/uL (ref 150–400)
RBC: 3.89 MIL/uL — ABNORMAL LOW (ref 4.22–5.81)
RDW: 19.4 % — ABNORMAL HIGH (ref 11.5–15.5)
WBC: 9.9 10*3/uL (ref 4.0–10.5)
nRBC: 0 % (ref 0.0–0.2)

## 2019-12-16 LAB — TROPONIN I (HIGH SENSITIVITY): Troponin I (High Sensitivity): 17 ng/L (ref <18)

## 2019-12-16 MED ORDER — SODIUM CHLORIDE 0.9 % IV BOLUS
1000.0000 mL | Freq: Once | INTRAVENOUS | Status: AC
  Administered 2019-12-16: 1000 mL via INTRAVENOUS

## 2019-12-16 MED ORDER — SODIUM CHLORIDE 0.9% FLUSH: 3.0000 mL | Freq: Once | INTRAVENOUS | Status: DC

## 2019-12-16 MED ORDER — IOHEXOL 9 MG/ML PO SOLN
ORAL | Status: AC
  Filled 2019-12-16: qty 1000

## 2019-12-16 MED ORDER — ONDANSETRON HCL 4 MG/2ML IJ SOLN
4.0000 mg | Freq: Once | INTRAMUSCULAR | Status: AC
  Administered 2019-12-17: 4 mg via INTRAVENOUS
  Filled 2019-12-16: qty 2

## 2019-12-16 MED ORDER — IOHEXOL 9 MG/ML PO SOLN: 500.0000 mL | ORAL | Status: AC

## 2019-12-16 NOTE — ED Triage Notes (Signed)
Pt presents with diarrhea 5 days ago, began vomiting this afternoon. Went to PCP they did a scan and informed them to come to ED for "looped bowels"  Pt had hernia surgery 6/15 here with Dr. [name].

## 2019-12-16 NOTE — ED Provider Notes (Signed)
[facility] EMERGENCY DEPARTMENT Provider Note   CSN: [id number] Arrival date & time: 12/16/19  1811     History Chief Complaint  Patient presents with  . Abdominal Pain    [name] [name] is a 79 y.o. male history of CHF, aortic aneurysm, HTN, here presenting with abdominal pain and distention.  Patient is postop day 15 status post left inguinal hernia repair.  Patient states that he has been having loose stools for the last 5 to 6 days.  Wife has been giving him Imodium which helped with his loose stools.  However today he started vomiting.  He states that he went to his see his doctor and had an x-ray that showed a possible obstruction so was sent here for further evaluation.   The history is provided by the patient.       Past Medical History:  Diagnosis Date  . Aortic valve insufficiency   . Arthritis    "probably in my knees before they replaced them" (01/28/2015)  . Atrial flutter (HCC)   . CAD (coronary artery disease)   . CHF (congestive heart failure) (HCC)   . Descending aortic aneurysm (HCC)   . DVT (deep venous thrombosis) (HCC)    ?LLE post knee surgery  . Dyspnea 09/2019  . History of blood transfusion    "after one of my knee surgeries"  . HTN (hypertension)   . Hyperlipidemia   . Thoracic aneurysm   . Tobacco abuse     Patient Active Problem List   Diagnosis Date Noted  . Incarcerated left inguinal hernia 12/01/2019  . Pressure injury of skin 10/06/2019  . Acute on chronic respiratory failure with hypoxia (HCC) 10/03/2019  . Left inguinal hernia 10/03/2019  . Multiple fractures of ribs, right side, init for clos fx 09/04/2019  . Chest pain 01/28/2015  . Near syncope 01/28/2015  . Chronic diastolic heart failure (HCC) 01/28/2015  . HCAP (healthcare-associated pneumonia) 10/11/2013  . Acute respiratory failure with hypoxia (HCC) 10/07/2013  . Shock circulatory (HCC) 10/07/2013  . UTI (urinary tract infection) 10/06/2013  .  Recurrent UTI 10/06/2013  . Urinary retention 10/06/2013  . Insomnia 09/04/2013  . Bradycardia 08/23/2013  . Acute on chronic diastolic heart failure (HCC) 08/22/2013  . Atrial fibrillation (HCC) 05/21/2013  . Atrial flutter (HCC)   . Acute on chronic systolic heart failure (HCC) 05/11/2013  . Aortic atherosclerosis (HCC) 05/05/2013  . Aortic dissection (HCC) 05/04/2013  . Hypochromic anemia 05/04/2013  . CAD (coronary artery disease)   . S/P AVR (aortic valve replacement)   . Hyperlipidemia   . HTN (hypertension)     Past Surgical History:  Procedure Laterality Date  . AORTIC VALVE REPLACEMENT  02/19/2009  . AORTO-FEMORAL BYPASS GRAFT  03/2010   ascending aortic and arch aneurysm repair/notes 04/09/2009  . CARDIAC VALVE REPLACEMENT    . CAROTID-SUBCLAVIAN BYPASS GRAFT Left 08/26/2013   Procedure: BYPASS GRAFT CAROTID-SUBCLAVIAN;  Surgeon: Nada Libman, MD;  Location: [facility] OR;  Service: Vascular;  Laterality: Left;  . CATARACT EXTRACTION W/ INTRAOCULAR LENS  IMPLANT, BILATERAL Bilateral   . ENDOVASCULAR STENT INSERTION N/A 08/26/2013   Procedure:  THORACIC STENT GRAFT INSERTION;  Surgeon: Nada Libman, MD;  Location: [facility] OR;  Service: Vascular;  Laterality: N/A;  . EYE SURGERY    . INGUINAL HERNIA REPAIR Left 12/02/2019   Procedure: REPAIR LEFT INGUINAL HERNIA WITH MESH;  Surgeon: Violeta Gelinas, MD;  Location: [facility] OR;  Service: General;  Laterality: Left;  .  INSERTION OF MESH Left 12/02/2019   Procedure: INSERTION OF MESH;  Surgeon: Violeta Gelinas, MD;  Location: [facility] OR;  Service: General;  Laterality: Left;  . JOINT REPLACEMENT    . KNEE ARTHROSCOPY Bilateral   . REPLACEMENT TOTAL KNEE Bilateral   . right groin lymphocele Right 03/29/2009  . RIGHT/LEFT HEART CATH AND CORONARY ANGIOGRAPHY N/A 10/06/2019   Procedure: RIGHT/LEFT HEART CATH AND CORONARY ANGIOGRAPHY;  Surgeon: Lyn Records, MD;  Location: [facility] INVASIVE CV LAB;  Service: Cardiovascular;  Laterality: N/A;  .  THORACENTESIS  2010 X 2  . TONSILLECTOMY         Family History  Problem Relation Age of Onset  . Celiac disease Daughter   . Aortic aneurysm Father   . Hypertension Mother   . Heart attack Neg Hx   . Stroke Neg Hx     Social History   Tobacco Use  . Smoking status: Former Smoker    Packs/day: 0.00    Types: Cigarettes    Quit date: 04/16/1974    Years since quitting: 45.6  . Smokeless tobacco: Never Used  Vaping Use  . Vaping Use: Never used  Substance Use Topics  . Alcohol use: No  . Drug use: No    Home Medications Prior to Admission medications   Medication Sig Start Date End Date Taking? Authorizing Provider  acetaminophen (TYLENOL) 500 MG tablet Take 2 tablets (1,000 mg total) by mouth every 6 (six) hours as needed. Patient taking differently: Take 1,000 mg by mouth every 6 (six) hours as needed for mild pain or moderate pain.  09/08/19   Barnetta Chapel, PA-C  amiodarone (PACERONE) 200 MG tablet Take 1 tablet (200 mg total) by mouth daily. 05/29/19   [name], MD  apixaban (ELIQUIS) 5 MG TABS tablet Take 1 tablet (5 mg total) by mouth 2 (two) times daily. 05/29/19   [name], MD  atorvastatin (LIPITOR) 10 MG tablet Take 1 tablet by mouth once daily Patient taking differently: Take 10 mg by mouth daily.  06/16/19   [name], MD  ferrous sulfate 325 (65 FE) MG tablet Take 1 tablet (325 mg total) by mouth daily with breakfast. 10/16/19   [name], MD  furosemide (LASIX) 40 MG tablet Take 1 tablet by mouth twice daily Patient taking differently: Take 40 mg by mouth 2 (two) times daily.  06/02/19   [name], MD  lansoprazole (PREVACID) 30 MG capsule Take 30 mg by mouth daily. 08/13/19   [provider]  loratadine (CLARITIN) 10 MG tablet Take 1 tablet (10 mg total) by mouth daily. 06/01/14   [name], MD  losartan (COZAAR) 25 MG tablet Take 1 tablet (25 mg total) by mouth daily. 11/03/19    [name], MD  methocarbamol (ROBAXIN) 500 MG tablet Take 2 tablets (1,000 mg total) by mouth every 8 (eight) hours as needed for muscle spasms. 09/08/19   Barnetta Chapel, PA-C  metoprolol succinate (TOPROL-XL) 100 MG 24 hr tablet Take 1 tablet (100 mg total) by mouth daily. Take with or immediately following a meal. 10/16/19   [name], MD  Multiple Vitamin (MULTIVITAMIN WITH MINERALS) TABS tablet Take 1 tablet by mouth daily.    [provider]  nitroGLYCERIN (NITROSTAT) 0.4 MG SL tablet Place 1 tablet (0.4 mg total) under the tongue every 5 (five) minutes as needed for chest pain (X3 DOSES BEFORE CALLING 911). 05/29/19   [name], MD  potassium  chloride SA (KLOR-CON) 20 MEQ tablet Take 2 tablets by mouth once daily Patient taking differently: Take 40 mEq by mouth daily.  06/16/19   [name], MD  spironolactone (ALDACTONE) 25 MG tablet Take 0.5 tablets (12.5 mg total) by mouth daily. 10/16/19   [name], MD  traMADol (ULTRAM) 50 MG tablet Take 1 tablet (50 mg total) by mouth every 6 (six) hours as needed for severe pain. 12/03/19   Violeta Gelinas, MD    Allergies    Lortab [hydrocodone-acetaminophen], Morphine and related, and Other  Review of Systems   Review of Systems  Gastrointestinal: Positive for abdominal distention and abdominal pain.  All other systems reviewed and are negative.   Physical Exam Updated Vital Signs BP (!) 147/52 (BP Location: Right Arm)   Pulse 74   Temp 98.2 F (36.8 C) (Oral)   Resp 16   SpO2 96%   Physical Exam Vitals and nursing note reviewed.  Constitutional:      Comments: Chronically ill   HENT:     Head: Normocephalic.     Mouth/Throat:     Mouth: Mucous membranes are moist.  Eyes:     Extraocular Movements: Extraocular movements intact.  Cardiovascular:     Rate and Rhythm: Normal rate and regular rhythm.     Heart sounds: Normal heart sounds.  Pulmonary:     Effort:  Pulmonary effort is normal.     Breath sounds: Normal breath sounds.  Abdominal:     Comments: Distended, + mild diffuse tenderness. L inguinal hernia site healing well and no obvious incarcerated hernia   Skin:    General: Skin is warm.     Capillary Refill: Capillary refill takes less than 2 seconds.  Neurological:     General: No focal deficit present.     Mental Status: He is oriented to person, place, and time.  Psychiatric:        Mood and Affect: Mood normal.        Behavior: Behavior normal.     ED Results / Procedures / Treatments   Labs (all labs ordered are listed, but only abnormal results are displayed) Labs Reviewed  COMPREHENSIVE METABOLIC PANEL - Abnormal; Notable for the following components:      Result Value   Glucose, Bld 114 (*)    BUN 41 (*)    Creatinine, Ser 1.72 (*)    GFR calc non Af Amer 37 (*)    GFR calc Af Amer 43 (*)    All other components within normal limits  CBC - Abnormal; Notable for the following components:   RBC 3.89 (*)    Hemoglobin 11.8 (*)    MCV 100.5 (*)    RDW 19.4 (*)    All other components within normal limits  LIPASE, BLOOD  URINALYSIS, ROUTINE W REFLEX MICROSCOPIC    EKG None  Radiology No results found.  Procedures Procedures (including critical care time)  Medications Ordered in ED Medications  sodium chloride flush (NS) 0.9 % injection 3 mL (3 mLs Intravenous Not Given 12/16/19 2128)  sodium chloride 0.9 % bolus 1,000 mL (1,000 mLs Intravenous New Bag/Given 12/16/19 2133)    ED Course  I have reviewed the triage vital signs and the nursing notes.  Pertinent labs & imaging results that were available during my care of the patient were reviewed by me and considered in my medical decision making (see chart for details).    MDM Rules/Calculators/A&P  PERCY WINTERROWD is a 79 y.o. male here presenting with abdominal pain and distention.  His x-ray showed possible SBO outpatient.   Consider SBO versus ileus.  Patient does not appear to have an incarcerated hernia on my exam.  Plan to get CBC, CMP, CT abdomen pelvis.   11:14 PM Patient's labs showed AKI with Cr of 1.7. Given IVF. CT ab/pel pending at sign out. Signed out to Dr. Blinda Leatherwood. Anticipate admission given AKI.   Final Clinical Impression(s) / ED Diagnoses Final diagnoses:  None    Rx / DC Orders ED Discharge Orders    None       Charlynne Pander, MD 12/16/19 2318

## 2019-12-17 ENCOUNTER — Emergency Department (HOSPITAL_COMMUNITY): Payer: Medicare Other

## 2019-12-17 ENCOUNTER — Inpatient Hospital Stay (HOSPITAL_COMMUNITY): Payer: Medicare Other

## 2019-12-17 ENCOUNTER — Encounter (HOSPITAL_COMMUNITY): Payer: Self-pay | Admitting: Internal Medicine

## 2019-12-17 DIAGNOSIS — E870 Hyperosmolality and hypernatremia: Secondary | ICD-10-CM | POA: Diagnosis not present

## 2019-12-17 DIAGNOSIS — I959 Hypotension, unspecified: Secondary | ICD-10-CM

## 2019-12-17 DIAGNOSIS — R0902 Hypoxemia: Secondary | ICD-10-CM | POA: Diagnosis not present

## 2019-12-17 DIAGNOSIS — R58 Hemorrhage, not elsewhere classified: Secondary | ICD-10-CM | POA: Diagnosis not present

## 2019-12-17 DIAGNOSIS — D638 Anemia in other chronic diseases classified elsewhere: Secondary | ICD-10-CM | POA: Diagnosis present

## 2019-12-17 DIAGNOSIS — Z952 Presence of prosthetic heart valve: Secondary | ICD-10-CM | POA: Diagnosis not present

## 2019-12-17 DIAGNOSIS — Z95820 Peripheral vascular angioplasty status with implants and grafts: Secondary | ICD-10-CM | POA: Diagnosis not present

## 2019-12-17 DIAGNOSIS — N179 Acute kidney failure, unspecified: Secondary | ICD-10-CM

## 2019-12-17 DIAGNOSIS — Z7901 Long term (current) use of anticoagulants: Secondary | ICD-10-CM | POA: Diagnosis not present

## 2019-12-17 DIAGNOSIS — K4030 Unilateral inguinal hernia, with obstruction, without gangrene, not specified as recurrent: Secondary | ICD-10-CM | POA: Diagnosis not present

## 2019-12-17 DIAGNOSIS — E860 Dehydration: Secondary | ICD-10-CM | POA: Diagnosis present

## 2019-12-17 DIAGNOSIS — I9589 Other hypotension: Secondary | ICD-10-CM | POA: Diagnosis not present

## 2019-12-17 DIAGNOSIS — I5042 Chronic combined systolic (congestive) and diastolic (congestive) heart failure: Secondary | ICD-10-CM | POA: Diagnosis not present

## 2019-12-17 DIAGNOSIS — K4091 Unilateral inguinal hernia, without obstruction or gangrene, recurrent: Secondary | ICD-10-CM | POA: Diagnosis not present

## 2019-12-17 DIAGNOSIS — K91872 Postprocedural seroma of a digestive system organ or structure following a digestive system procedure: Secondary | ICD-10-CM | POA: Diagnosis not present

## 2019-12-17 DIAGNOSIS — R000 Tachycardia, unspecified: Secondary | ICD-10-CM | POA: Diagnosis not present

## 2019-12-17 DIAGNOSIS — I2582 Chronic total occlusion of coronary artery: Secondary | ICD-10-CM | POA: Diagnosis present

## 2019-12-17 DIAGNOSIS — E861 Hypovolemia: Secondary | ICD-10-CM

## 2019-12-17 DIAGNOSIS — I110 Hypertensive heart disease with heart failure: Secondary | ICD-10-CM | POA: Diagnosis not present

## 2019-12-17 DIAGNOSIS — I10 Essential (primary) hypertension: Secondary | ICD-10-CM | POA: Diagnosis not present

## 2019-12-17 DIAGNOSIS — I484 Atypical atrial flutter: Secondary | ICD-10-CM

## 2019-12-17 DIAGNOSIS — I480 Paroxysmal atrial fibrillation: Secondary | ICD-10-CM | POA: Diagnosis not present

## 2019-12-17 DIAGNOSIS — Z885 Allergy status to narcotic agent status: Secondary | ICD-10-CM | POA: Diagnosis not present

## 2019-12-17 DIAGNOSIS — E876 Hypokalemia: Secondary | ICD-10-CM | POA: Diagnosis not present

## 2019-12-17 DIAGNOSIS — Y838 Other surgical procedures as the cause of abnormal reaction of the patient, or of later complication, without mention of misadventure at the time of the procedure: Secondary | ICD-10-CM | POA: Diagnosis not present

## 2019-12-17 DIAGNOSIS — I5022 Chronic systolic (congestive) heart failure: Secondary | ICD-10-CM | POA: Diagnosis not present

## 2019-12-17 DIAGNOSIS — I5032 Chronic diastolic (congestive) heart failure: Secondary | ICD-10-CM | POA: Diagnosis not present

## 2019-12-17 DIAGNOSIS — K4031 Unilateral inguinal hernia, with obstruction, without gangrene, recurrent: Secondary | ICD-10-CM

## 2019-12-17 DIAGNOSIS — I25118 Atherosclerotic heart disease of native coronary artery with other forms of angina pectoris: Secondary | ICD-10-CM | POA: Diagnosis not present

## 2019-12-17 DIAGNOSIS — I4892 Unspecified atrial flutter: Secondary | ICD-10-CM | POA: Diagnosis not present

## 2019-12-17 DIAGNOSIS — K9172 Accidental puncture and laceration of a digestive system organ or structure during other procedure: Secondary | ICD-10-CM | POA: Diagnosis not present

## 2019-12-17 DIAGNOSIS — Z20822 Contact with and (suspected) exposure to covid-19: Secondary | ICD-10-CM | POA: Diagnosis not present

## 2019-12-17 DIAGNOSIS — I255 Ischemic cardiomyopathy: Secondary | ICD-10-CM | POA: Diagnosis not present

## 2019-12-17 DIAGNOSIS — Z79899 Other long term (current) drug therapy: Secondary | ICD-10-CM | POA: Diagnosis not present

## 2019-12-17 DIAGNOSIS — L7634 Postprocedural seroma of skin and subcutaneous tissue following other procedure: Secondary | ICD-10-CM | POA: Diagnosis present

## 2019-12-17 DIAGNOSIS — Z87891 Personal history of nicotine dependence: Secondary | ICD-10-CM | POA: Diagnosis not present

## 2019-12-17 DIAGNOSIS — I4819 Other persistent atrial fibrillation: Secondary | ICD-10-CM | POA: Diagnosis not present

## 2019-12-17 DIAGNOSIS — I2510 Atherosclerotic heart disease of native coronary artery without angina pectoris: Secondary | ICD-10-CM | POA: Diagnosis present

## 2019-12-17 DIAGNOSIS — Z8249 Family history of ischemic heart disease and other diseases of the circulatory system: Secondary | ICD-10-CM | POA: Diagnosis not present

## 2019-12-17 DIAGNOSIS — K9171 Accidental puncture and laceration of a digestive system organ or structure during a digestive system procedure: Secondary | ICD-10-CM | POA: Diagnosis not present

## 2019-12-17 DIAGNOSIS — E785 Hyperlipidemia, unspecified: Secondary | ICD-10-CM | POA: Diagnosis not present

## 2019-12-17 HISTORY — DX: Unilateral inguinal hernia, with obstruction, without gangrene, recurrent: K40.31

## 2019-12-17 HISTORY — DX: Hypotension, unspecified: I95.9

## 2019-12-17 HISTORY — DX: Acute kidney failure, unspecified: N17.9

## 2019-12-17 LAB — URINALYSIS, ROUTINE W REFLEX MICROSCOPIC
Bilirubin Urine: NEGATIVE
Glucose, UA: NEGATIVE mg/dL
Hgb urine dipstick: NEGATIVE
Ketones, ur: NEGATIVE mg/dL
Leukocytes,Ua: NEGATIVE
Nitrite: NEGATIVE
Protein, ur: NEGATIVE mg/dL
Specific Gravity, Urine: 1.012 (ref 1.005–1.030)
pH: 5 (ref 5.0–8.0)

## 2019-12-17 LAB — MAGNESIUM: Magnesium: 2.4 mg/dL (ref 1.7–2.4)

## 2019-12-17 LAB — SARS CORONAVIRUS 2 BY RT PCR (HOSPITAL ORDER, PERFORMED IN ~~LOC~~ HOSPITAL LAB): SARS Coronavirus 2: NEGATIVE

## 2019-12-17 LAB — BASIC METABOLIC PANEL
Anion gap: 8 (ref 5–15)
BUN: 38 mg/dL — ABNORMAL HIGH (ref 8–23)
CO2: 24 mmol/L (ref 22–32)
Calcium: 8.6 mg/dL — ABNORMAL LOW (ref 8.9–10.3)
Chloride: 113 mmol/L — ABNORMAL HIGH (ref 98–111)
Creatinine, Ser: 1.52 mg/dL — ABNORMAL HIGH (ref 0.61–1.24)
GFR calc Af Amer: 49 mL/min — ABNORMAL LOW (ref >60)
GFR calc non Af Amer: 43 mL/min — ABNORMAL LOW (ref >60)
Glucose, Bld: 90 mg/dL (ref 70–99)
Potassium: 4.6 mmol/L (ref 3.5–5.1)
Sodium: 145 mmol/L (ref 135–145)

## 2019-12-17 LAB — TROPONIN I (HIGH SENSITIVITY)
Troponin I (High Sensitivity): 20 ng/L — ABNORMAL HIGH (ref <18)
Troponin I (High Sensitivity): 22 ng/L — ABNORMAL HIGH (ref <18)

## 2019-12-17 LAB — BRAIN NATRIURETIC PEPTIDE: B Natriuretic Peptide: 587.5 pg/mL — ABNORMAL HIGH (ref 0.0–100.0)

## 2019-12-17 MED ORDER — METOPROLOL TARTRATE 5 MG/5ML IV SOLN: 2.5000 mg | INTRAVENOUS | Status: DC | PRN

## 2019-12-17 MED ORDER — METOPROLOL TARTRATE 5 MG/5ML IV SOLN
2.5000 mg | Freq: Four times a day (QID) | INTRAVENOUS | Status: DC
  Administered 2019-12-17 – 2019-12-19 (×6): 2.5 mg via INTRAVENOUS
  Filled 2019-12-17 (×6): qty 5

## 2019-12-17 MED ORDER — DIPHENHYDRAMINE HCL 50 MG/ML IJ SOLN: 25.0000 mg | Freq: Four times a day (QID) | INTRAMUSCULAR | Status: DC | PRN

## 2019-12-17 MED ORDER — PROCHLORPERAZINE EDISYLATE 10 MG/2ML IJ SOLN: 10.0000 mg | Freq: Four times a day (QID) | INTRAMUSCULAR | Status: DC | PRN

## 2019-12-17 MED ORDER — SODIUM CHLORIDE 0.9 % IV BOLUS
500.0000 mL | Freq: Once | INTRAVENOUS | Status: AC
  Administered 2019-12-17: 500 mL via INTRAVENOUS

## 2019-12-17 MED ORDER — MORPHINE SULFATE (PF) 2 MG/ML IV SOLN: 1.0000 mg | INTRAVENOUS | Status: DC | PRN

## 2019-12-17 MED ORDER — SODIUM CHLORIDE 0.9 % IV SOLN: INTRAVENOUS | Status: DC

## 2019-12-17 MED ORDER — SODIUM CHLORIDE 0.9 % IV SOLN
INTRAVENOUS | Status: DC
  Administered 2019-12-18: 50 mL via INTRAVENOUS

## 2019-12-17 MED ORDER — IOHEXOL 300 MG/ML  SOLN
100.0000 mL | Freq: Once | INTRAMUSCULAR | Status: AC | PRN
  Administered 2019-12-17: 80 mL via INTRAVENOUS

## 2019-12-17 NOTE — Progress Notes (Signed)
79 year old male with prior h/o PAF on eliquis, CAD, chronic systolic HF, hypertension, hyperlipidemia, h/o AV replacement, AA s/p repair, incarcerated left inguinal hernia s/p repair on 12/02/2019, presents to ED with abd pain.  He was found to have partial colonic obstruction. General surgery consulted.  Plan for LAP  Repair by Dr Andrey Campanile tomorrow.    Pt seen and examined at bedside.  Pt admitted by Dr Loney Loh earlier this am. Please see her note for detailed H&P   Plan:  Continue with IV fluids and npo.   Kathlen Mody, MD

## 2019-12-17 NOTE — H&P (Signed)
History and Physical    ALGERNON MUNDIE HYW:[id number] DOB: 1940-12-06 DOA: 12/16/2019  PCP: Bobbye Morton, MD Patient coming from: Home  Chief Complaint: Abdominal pain  HPI: [name] [name] is a 79 y.o. male with medical history significant of paroxysmal atrial flutter on Eliquis, CAD, chronic systolic CHF (EF 25 to 30% on echo done 10/03/2019), hypertension, hyperlipidemia, history of aortic valve replacement, aortic aneurysm status post repair, chronically incarcerated left inguinal hernia status post repair on 12/02/2019 presenting to the ED with a chief complaint of abdominal pain.  History provided by patient and wife at bedside.  Wife states patient has had diarrhea for several days which has improved over time.  He had 2 episodes of small-volume loose stools yesterday.  Also started vomiting yesterday and has had 3 episodes since then.  Patient denies abdominal pain.  No other complaints.  Denies fevers, shortness of breath, or chest pain.  ED Course: Hypotensive with systolic in the 80s on arrival.  Not febrile or tachycardic.  Labs showing no leukocytosis.  Hemoglobin 11.8, improved compared to prior labs.  BUN 41, creatinine 1.7.  Baseline creatinine 1.1.  Lipase and LFTs normal.  High-sensitivity troponin 17 >20.  UA without signs of infection.  Screening SARS-CoV-2 PCR test pending.  CT abdomen pelvis showing recurrent left inguinal hernia which currently contains short segment of sigmoid colon.  Colon proximal to the hernia is diffusely dilated and fluid filled with air fluid levels, suspicious for partial colonic obstruction.  Minimal stranding in the hernia sac with small amount of free fluid.  No definite colonic wall thickening or evidence of ischemia.  General surgery (Dr. Dwain Sarna) consulted.  Blood pressure improved after 2 L fluid boluses.  Review of Systems:  All systems reviewed and apart from history of presenting illness, are negative.  Past Medical History:    Diagnosis Date  . Aortic valve insufficiency   . Arthritis    "probably in my knees before they replaced them" (01/28/2015)  . Atrial flutter (HCC)   . CAD (coronary artery disease)   . CHF (congestive heart failure) (HCC)   . Descending aortic aneurysm (HCC)   . DVT (deep venous thrombosis) (HCC)    ?LLE post knee surgery  . Dyspnea 09/2019  . History of blood transfusion    "after one of my knee surgeries"  . HTN (hypertension)   . Hyperlipidemia   . Thoracic aneurysm   . Tobacco abuse     Past Surgical History:  Procedure Laterality Date  . AORTIC VALVE REPLACEMENT  02/19/2009  . AORTO-FEMORAL BYPASS GRAFT  03/2010   ascending aortic and arch aneurysm repair/notes 04/09/2009  . CARDIAC VALVE REPLACEMENT    . CAROTID-SUBCLAVIAN BYPASS GRAFT Left 08/26/2013   Procedure: BYPASS GRAFT CAROTID-SUBCLAVIAN;  Surgeon: Nada Libman, MD;  Location: [facility] OR;  Service: Vascular;  Laterality: Left;  . CATARACT EXTRACTION W/ INTRAOCULAR LENS  IMPLANT, BILATERAL Bilateral   . ENDOVASCULAR STENT INSERTION N/A 08/26/2013   Procedure:  THORACIC STENT GRAFT INSERTION;  Surgeon: Nada Libman, MD;  Location: [facility] OR;  Service: Vascular;  Laterality: N/A;  . EYE SURGERY    . INGUINAL HERNIA REPAIR Left 12/02/2019   Procedure: REPAIR LEFT INGUINAL HERNIA WITH MESH;  Surgeon: Violeta Gelinas, MD;  Location: [facility] OR;  Service: General;  Laterality: Left;  . INSERTION OF MESH Left 12/02/2019   Procedure: INSERTION OF MESH;  Surgeon: Violeta Gelinas, MD;  Location: [facility] OR;  Service: General;  Laterality: Left;  .  JOINT REPLACEMENT    . KNEE ARTHROSCOPY Bilateral   . REPLACEMENT TOTAL KNEE Bilateral   . right groin lymphocele Right 03/29/2009  . RIGHT/LEFT HEART CATH AND CORONARY ANGIOGRAPHY N/A 10/06/2019   Procedure: RIGHT/LEFT HEART CATH AND CORONARY ANGIOGRAPHY;  Surgeon: Lyn Records, MD;  Location: [facility] INVASIVE CV LAB;  Service: Cardiovascular;  Laterality: N/A;  . THORACENTESIS  2010 X 2  .  TONSILLECTOMY       reports that he quit smoking about 45 years ago. His smoking use included cigarettes. He smoked 0.00 packs per day. He has never used smokeless tobacco. He reports that he does not drink alcohol and does not use drugs.  Allergies  Allergen Reactions  . Lortab [Hydrocodone-Acetaminophen] Hives    No trouble breathing  . Morphine And Related Hives  . Other Other (See Comments)    Staples from surgery caused infection    Family History  Problem Relation Age of Onset  . Celiac disease Daughter   . Aortic aneurysm Father   . Hypertension Mother   . Heart attack Neg Hx   . Stroke Neg Hx     Prior to Admission medications   Medication Sig Start Date End Date Taking? Authorizing Provider  acetaminophen (TYLENOL) 500 MG tablet Take 2 tablets (1,000 mg total) by mouth every 6 (six) hours as needed. Patient taking differently: Take 1,000 mg by mouth every 6 (six) hours as needed for mild pain or moderate pain.  09/08/19   Barnetta Chapel, PA-C  amiodarone (PACERONE) 200 MG tablet Take 1 tablet (200 mg total) by mouth daily. 05/29/19   [name], MD  apixaban (ELIQUIS) 5 MG TABS tablet Take 1 tablet (5 mg total) by mouth 2 (two) times daily. 05/29/19   [name], MD  atorvastatin (LIPITOR) 10 MG tablet Take 1 tablet by mouth once daily Patient taking differently: Take 10 mg by mouth daily.  06/16/19   [name], MD  ferrous sulfate 325 (65 FE) MG tablet Take 1 tablet (325 mg total) by mouth daily with breakfast. 10/16/19   [name], MD  furosemide (LASIX) 40 MG tablet Take 1 tablet by mouth twice daily Patient taking differently: Take 40 mg by mouth 2 (two) times daily.  06/02/19   [name], MD  lansoprazole (PREVACID) 30 MG capsule Take 30 mg by mouth daily. 08/13/19   [provider]  loratadine (CLARITIN) 10 MG tablet Take 1 tablet (10 mg total) by mouth daily. 06/01/14   [name], MD  losartan  (COZAAR) 25 MG tablet Take 1 tablet (25 mg total) by mouth daily. 11/03/19   [name], MD  methocarbamol (ROBAXIN) 500 MG tablet Take 2 tablets (1,000 mg total) by mouth every 8 (eight) hours as needed for muscle spasms. 09/08/19   Barnetta Chapel, PA-C  metoprolol succinate (TOPROL-XL) 100 MG 24 hr tablet Take 1 tablet (100 mg total) by mouth daily. Take with or immediately following a meal. 10/16/19   [name], MD  Multiple Vitamin (MULTIVITAMIN WITH MINERALS) TABS tablet Take 1 tablet by mouth daily.    [provider]  nitroGLYCERIN (NITROSTAT) 0.4 MG SL tablet Place 1 tablet (0.4 mg total) under the tongue every 5 (five) minutes as needed for chest pain (X3 DOSES BEFORE CALLING 911). 05/29/19   [name], MD  potassium chloride SA (KLOR-CON) 20 MEQ tablet Take 2 tablets by mouth once daily Patient taking differently: Take 40 mEq by mouth  daily.  06/16/19   [name], MD  spironolactone (ALDACTONE) 25 MG tablet Take 0.5 tablets (12.5 mg total) by mouth daily. 10/16/19   [name], MD  traMADol (ULTRAM) 50 MG tablet Take 1 tablet (50 mg total) by mouth every 6 (six) hours as needed for severe pain. 12/03/19   Violeta Gelinas, MD    Physical Exam: Vitals:   12/17/19 0200 12/17/19 0300 12/17/19 0345 12/17/19 0400  BP: (!) 87/54 121/63 (!) 101/46 (!) 95/50  Pulse: 94 89 84 85  Resp: 14 11 11 12   Temp:      TempSrc:      SpO2: 96% 95% 96% 94%    Physical Exam Constitutional:      General: He is not in acute distress. HENT:     Head: Normocephalic.  Eyes:     General:        Right eye: No discharge.        Left eye: No discharge.     Extraocular Movements: Extraocular movements intact.  Cardiovascular:     Rate and Rhythm: Normal rate and regular rhythm.     Pulses: Normal pulses.  Pulmonary:     Effort: Pulmonary effort is normal. No respiratory distress.     Breath sounds: Rales present. No wheezing.     Comments:  Bibasilar rales Abdominal:     General: There is distension.     Tenderness: There is no abdominal tenderness. There is no guarding or rebound.     Comments: Tinkling bowel sounds Significant abdominal distention  Musculoskeletal:     Cervical back: Normal range of motion and neck supple.     Right lower leg: Edema present.     Left lower leg: Edema present.     Comments: +2 bilateral lower extremity edema  Skin:    General: Skin is warm and dry.  Neurological:     Mental Status: He is alert and oriented to person, place, and time.  Psychiatric:        Behavior: Behavior normal.        Thought Content: Thought content normal.     Labs on Admission: I have personally reviewed following labs and imaging studies  CBC: Recent Labs  Lab 12/16/19 1841  WBC 9.9  HGB 11.8*  HCT 39.1  MCV 100.5*  PLT 369   Basic Metabolic Panel: Recent Labs  Lab 12/16/19 1841  NA 144  K 4.4  CL 108  CO2 23  GLUCOSE 114*  BUN 41*  CREATININE 1.72*  CALCIUM 9.4   GFR: CrCl cannot be calculated (Unknown ideal weight.). Liver Function Tests: Recent Labs  Lab 12/16/19 1841  AST 34  ALT 24  ALKPHOS 56  BILITOT 0.7  PROT 7.5  ALBUMIN 4.0   Recent Labs  Lab 12/16/19 1841  LIPASE 28   No results for input(s): AMMONIA in the last 168 hours. Coagulation Profile: No results for input(s): INR, PROTIME in the last 168 hours. Cardiac Enzymes: No results for input(s): CKTOTAL, CKMB, CKMBINDEX, TROPONINI in the last 168 hours. BNP (last 3 results) No results for input(s): PROBNP in the last 8760 hours. HbA1C: No results for input(s): HGBA1C in the last 72 hours. CBG: No results for input(s): GLUCAP in the last 168 hours. Lipid Profile: No results for input(s): CHOL, HDL, LDLCALC, TRIG, CHOLHDL, LDLDIRECT in the last 72 hours. Thyroid Function Tests: No results for input(s): TSH, T4TOTAL, FREET4, T3FREE, THYROIDAB in the last 72 hours. Anemia Panel: No results for  input(s):  VITAMINB12, FOLATE, FERRITIN, TIBC, IRON, RETICCTPCT in the last 72 hours. Urine analysis:    Component Value Date/Time   COLORURINE YELLOW 12/17/2019 0030   APPEARANCEUR CLEAR 12/17/2019 0030   LABSPEC 1.012 12/17/2019 0030   PHURINE 5.0 12/17/2019 0030   GLUCOSEU NEGATIVE 12/17/2019 0030   HGBUR NEGATIVE 12/17/2019 0030   BILIRUBINUR NEGATIVE 12/17/2019 0030   KETONESUR NEGATIVE 12/17/2019 0030   PROTEINUR NEGATIVE 12/17/2019 0030   UROBILINOGEN 1.0 10/06/2013 0000   NITRITE NEGATIVE 12/17/2019 0030   LEUKOCYTESUR NEGATIVE 12/17/2019 0030    Radiological Exams on Admission: CT ABDOMEN PELVIS W CONTRAST  Result Date: 12/17/2019 CLINICAL DATA:  Abdominal distension. Diarrhea. Vomiting. Recent hernia surgery. EXAM: CT ABDOMEN AND PELVIS WITH CONTRAST TECHNIQUE: Multidetector CT imaging of the abdomen and pelvis was performed using the standard protocol following bolus administration of intravenous contrast. CONTRAST:  OMNIPAQUE IOHEXOL 300 MG/ML  SOLN COMPARISON:  Most recent CT 09/04/2019 FINDINGS: Lower chest: Descending thoracic aortic stent graft is partially included. Stable dimensions of the distal thoracic aorta. Multi chamber cardiomegaly. Mitral annulus calcifications. Coronary artery calcifications. No pleural effusion or acute basilar airspace disease. Hepatobiliary: No focal hepatic lesion. Layering sludge and stones in the gallbladder without pericholecystic inflammation or biliary dilatation. Pancreas: Parenchymal atrophy. No ductal dilatation or inflammation. Spleen: Normal in size without focal abnormality. Adrenals/Urinary Tract: Normal adrenal glands. No hydronephrosis or perinephric edema. Homogeneous renal enhancement with symmetric excretion on delayed phase imaging. There are small bilateral renal cysts. Urinary bladder is physiologically distended without wall thickening. Mass effect on the bladder base from enlarged prostate gland. Stomach/Bowel: Recurrent left  inguinal hernia which currently contains short segment of sigmoid colon. Colon proximal to the hernia is diffusely and prominently dilated and fluid-filled with air-fluid levels. Minimal stranding in the hernia sac with small amount of free fluid. There is no definite colonic wall thickening. More distal sigmoid colon is less distended with formed stool. Colonic diverticulosis without focal diverticulitis. The cecum is located in the right mid abdomen, containing fluid and dependent density, possibly ingested contents or contrast. There is no significant small bowel dilatation. There is no pneumatosis. Enteric contrast within the distal esophagus with moderate hiatal hernia. Stomach is not abnormally distended. Vascular/Lymphatic: Aortic atherosclerosis. 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 Moderate bi-iliac atherosclerosis. Patent portal vein. Patent mesenteric vessels. No portal venous gas. No bulky abdominopelvic adenopathy. Reproductive: Enlarged prostate gland spans 5 cm AP with nodular mass effect on the bladder base. Other: Left inguinal hernia, apparently recurrent given history of recent hernia repair. Hernia contains short segment of sigmoid colon. There is fluid in the dependent aspect of the hernia sac. Mild mesenteric and soft tissue stranding in the hernia. No evidence of free air. Small fat containing right inguinal hernia. Small fat containing umbilical hernia. Musculoskeletal: Scoliosis and multilevel degenerative change in the spine. There are no acute or suspicious osseous abnormalities. IMPRESSION: 1. Recurrent left inguinal hernia which currently contains short segment of sigmoid colon. Colon proximal to the hernia is diffusely dilated and fluid-filled with air-fluid levels, suspicious for partial colonic obstruction. Minimal stranding in the hernia sac with small amount of free fluid. No definite colonic wall thickening or evidence of ischemia. 2. Floppy cecum currently located  in the right mid abdomen. 3. Colonic diverticulosis without focal diverticulitis. 4. Enlarged prostate gland with nodular mass effect on the bladder base. 5. Moderate hiatal hernia. Enteric contrast within the distal esophagus, can be seen with vomiting, reflux or delayed transit.  6. Gallstones without pericholecystic inflammation. Aortic Atherosclerosis (ICD10-I70.0). Electronically Signed   By: Narda Rutherford M.D.   On: 12/17/2019 02:10    EKG: Independently reviewed.  Rate controlled A. fib, QTC 527.  ST depressions in lateral leads appear slightly more pronounced compared to prior tracing.  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  CT abdomen pelvis done today showing recurrent left inguinal hernia which currently contains short segment of sigmoid colon.  Colon proximal to the hernia is diffusely dilated and fluid filled with air fluid levels, suspicious for partial colonic obstruction.  Minimal stranding in the hernia sac with small amount of free fluid.  No definite colonic wall thickening or evidence of ischemia. -General surgery has been consulted, recommendations pending.  Keep n.p.o. Compazine as needed for nausea/vomiting.  Patient is not complaining of pain.  He received 2 L IV fluid boluses for hypotension.  Will avoid giving additional IV fluid at this time due to concern for volume overload in the setting of heart failure with reduced ejection fraction.  Hypotension: Likely due to volume depletion/dehydration.  No fever, leukocytosis, or signs of sepsis.  Blood pressure has improved after 2 L IV fluid boluses. -Continue to monitor  AKI:  Likely due to prerenal azotemia from dehydration in the setting of home Lasix, losartan, and spironolactone use. BUN 41, creatinine 1.7.  Baseline creatinine 1.1.  -He received 2 L IV fluid boluses for hypotension.  Will avoid giving additional IV fluid at this time due to concern for volume overload in the setting of heart failure with reduced ejection fraction.  Repeat BMP in a.m. Monitor urine output.  Hold Lasix, losartan, and spironolactone.  Avoid any other nephrotoxic agents.  Paroxysmal atrial fibrillation/flutter: Currently rate controlled. -Hold Eliquis in anticipation of likely surgery for recurrent left inguinal hernia.  Resume amiodarone and metoprolol after pharmacy med rec is done.  CAD: EKG showing ST depressions in lateral leads which appear slightly more pronounced compared to the prior tracing.  ACS less likely as patient denies chest pain and high-sensitivity troponin only mildly elevated 17>20.  -Cardiac monitoring, trend troponin   Chronic systolic CHF: EF 25 to 30% on echo done 10/03/2019.  Received 2 L fluid boluses for hypotension.  Does have signs of volume overload including rales and bilateral lower extremity edema. -Hold diuretics and ARB given AKI.  Resume beta-blocker after pharmacy med rec is done.   Monitor volume status closely.  Check BNP and order chest x-ray.  Hypertension -Hold antihypertensives at this time given hypotension at the time of presentation  QT prolongation on EKG: Patient is on amiodarone for paroxysmal atrial flutter.  QTc 527 and appears stable compared to prior EKGs. -Cardiac monitoring.  Keep potassium above 4 and magnesium above 2.  Avoid any additional QT prolonging drugs if possible.  DVT prophylaxis: SCDs at this time Code Status: Full code-discussed with patient and his wife at bedside Family Communication: Wife updated. Disposition Plan: Status is: Inpatient  Remains inpatient appropriate because:IV treatments appropriate due to  intensity of illness or inability to take PO, Inpatient level of care appropriate due to severity of illness and Recurrent hernia with bowel obstruction which will require surgery.  Dispo: The patient is from: Home              Anticipated d/c is to: Home              Anticipated d/c date is: > 3 days              Patient currently is not medically stable to d/c.   The medical decision making on this patient was of high complexity and the patient is at high risk for clinical deterioration, therefore this is a level 3 visit.  John Giovanni MD [facility]  If 7PM-7AM, please contact night-coverage [email or web address]  12/17/2019, 4:33 AM

## 2019-12-17 NOTE — Consult Note (Signed)
Reason for Consult: n/v Referring Physician: Dr Lia [name] is an 79 y.o. male.  HPI: [name] [name] with mmp including cad, chf, on DOAC who underwent scrotal LIH repair by [name] [name] 6/15 from which [name] [name] has done well.  Over past few days has some bloating, some loose stools and then vomiting yesterday. Not much pain at hernia site of in abdomen.  [name] [name] saw pcp and was found to have dilated loops on kub and was sent to the ER.  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 I was asked to see him  Past Medical History:  Diagnosis Date  . Aortic valve insufficiency   . Arthritis    "probably in my knees before they replaced them" (01/28/2015)  . Atrial flutter (HCC)   . CAD (coronary artery disease)   . CHF (congestive heart failure) (HCC)   . Descending aortic aneurysm (HCC)   . DVT (deep venous thrombosis) (HCC)    ?LLE post knee surgery  . Dyspnea 09/2019  . History of blood transfusion    "after one of my knee surgeries"  . HTN (hypertension)   . Hyperlipidemia   . Thoracic aneurysm   . Tobacco abuse     Past Surgical History:  Procedure Laterality Date  . AORTIC VALVE REPLACEMENT  02/19/2009  . AORTO-FEMORAL BYPASS GRAFT  03/2010   ascending aortic and arch aneurysm repair/notes 04/09/2009  . CARDIAC VALVE REPLACEMENT    . CAROTID-SUBCLAVIAN BYPASS GRAFT Left 08/26/2013   Procedure: BYPASS GRAFT CAROTID-SUBCLAVIAN;  Surgeon: Nada Libman, MD;  Location: [facility] OR;  Service: Vascular;  Laterality: Left;  . CATARACT EXTRACTION W/ INTRAOCULAR LENS  IMPLANT, BILATERAL Bilateral   . ENDOVASCULAR STENT INSERTION N/A 08/26/2013   Procedure:  THORACIC STENT GRAFT INSERTION;  Surgeon: Nada Libman, MD;  Location: [facility] OR;  Service: Vascular;  Laterality: N/A;  . EYE SURGERY    . INGUINAL HERNIA REPAIR Left 12/02/2019   Procedure: REPAIR LEFT INGUINAL HERNIA WITH MESH;  Surgeon: Violeta Gelinas, MD;  Location: [facility] OR;  Service: General;  Laterality: Left;  .  INSERTION OF MESH Left 12/02/2019   Procedure: INSERTION OF MESH;  Surgeon: Violeta Gelinas, MD;  Location: [facility] OR;  Service: General;  Laterality: Left;  . JOINT REPLACEMENT    . KNEE ARTHROSCOPY Bilateral   . REPLACEMENT TOTAL KNEE Bilateral   . right groin lymphocele Right 03/29/2009  . RIGHT/LEFT HEART CATH AND CORONARY ANGIOGRAPHY N/A 10/06/2019   Procedure: RIGHT/LEFT HEART CATH AND CORONARY ANGIOGRAPHY;  Surgeon: Lyn Records, MD;  Location: [facility] INVASIVE CV LAB;  Service: Cardiovascular;  Laterality: N/A;  . THORACENTESIS  2010 X 2  . TONSILLECTOMY      Family History  Problem Relation Age of Onset  . Celiac disease Daughter   . Aortic aneurysm Father   . Hypertension Mother   . Heart attack Neg Hx   . Stroke Neg Hx     Social History:  reports that [name] [name] quit smoking about 45 years ago. His smoking use included cigarettes. [name] [name] smoked 0.00 packs per day. [name] [name] has never used smokeless tobacco. [name] [name] reports that [name] [name] does not drink alcohol and does not use drugs.  Allergies:  Allergies  Allergen Reactions  . Lortab [Hydrocodone-Acetaminophen] Hives    No trouble breathing  . Morphine And Related Hives  . Other Other (See Comments)    Staples from surgery caused infection    Medications: I have reviewed the patient's current medications.  Results for orders placed or performed during the hospital encounter of 12/16/19 (from the past 48 hour(s))  Lipase, blood     Status: None   Collection Time: 12/16/19  6:41 PM  Result Value Ref Range   Lipase 28 11 - 51 U/L    Comment: Performed at [facility], [name], [place], [place] [address]  Comprehensive metabolic panel     Status: Abnormal   Collection Time: 12/16/19  6:41 PM  Result Value Ref Range   Sodium 144 135 - 145 mmol/L   Potassium 4.4 3.5 - 5.1 mmol/L   Chloride 108 98 - 111 mmol/L   CO2 23 22 - 32 mmol/L   Glucose, Bld 114 (H) 70 - 99 mg/dL    Comment: Glucose reference range applies only to samples taken  after fasting for at least 8 hours.   BUN 41 (H) 8 - 23 mg/dL   Creatinine, Ser 8.25 (H) 0.61 - 1.24 mg/dL   Calcium 9.4 8.9 - 05.3 mg/dL   Total Protein 7.5 6.5 - 8.1 g/dL   Albumin 4.0 3.5 - 5.0 g/dL   AST 34 15 - 41 U/L   ALT 24 0 - 44 U/L   Alkaline Phosphatase 56 38 - 126 U/L   Total Bilirubin 0.7 0.3 - 1.2 mg/dL   GFR calc non Af Amer 37 (L) >60 mL/min   GFR calc Af Amer 43 (L) >60 mL/min   Anion gap 13 5 - 15    Comment: Performed at [facility], [address], [place], [place] [address]  CBC     Status: Abnormal   Collection Time: 12/16/19  6:41 PM  Result Value Ref Range   WBC 9.9 4.0 - 10.5 K/uL   RBC 3.89 (L) 4.22 - 5.81 MIL/uL   Hemoglobin 11.8 (L) 13.0 - 17.0 g/dL   HCT 41.9 39 - 52 %   MCV 100.5 (H) 80.0 - 100.0 fL   MCH 30.3 26.0 - 34.0 pg   MCHC 30.2 30.0 - 36.0 g/dL   RDW 37.9 (H) 02.4 - 09.7 %   Platelets 369 150 - 400 K/uL   nRBC 0.0 0.0 - 0.2 %    Comment: Performed at [facility], [address], [place], [place] [address]  Troponin I (High Sensitivity)     Status: None   Collection Time: 12/16/19 10:30 PM  Result Value Ref Range   Troponin I (High Sensitivity) 17 <18 ng/L    Comment: (NOTE) Elevated high sensitivity troponin I (hsTnI) values and significant  changes across serial measurements may suggest ACS but many other  chronic and acute conditions are known to elevate hsTnI results.  Refer to the "Links" section for chest pain algorithms and additional  guidance. Performed at [facility], [name], [place], [place] [address]   Troponin I (High Sensitivity)     Status: Abnormal   Collection Time: 12/17/19 12:13 AM  Result Value Ref Range   Troponin I (High Sensitivity) 20 (H) <18 ng/L    Comment: (NOTE) Elevated high sensitivity troponin I (hsTnI) values and significant  changes across serial measurements may suggest ACS but many other  chronic and acute conditions are known to elevate hsTnI results.  Refer to the  "Links" section for chest pain algorithms and additional  guidance. Performed at [facility], [address] [place], [place] [address]   Urinalysis, Routine w reflex microscopic     Status:  None   Collection Time: 12/17/19 12:30 AM  Result Value Ref Range   Color, Urine YELLOW YELLOW   APPearance CLEAR CLEAR   Specific Gravity, Urine 1.012 1.005 - 1.030   pH 5.0 5.0 - 8.0   Glucose, UA NEGATIVE NEGATIVE mg/dL   Hgb urine dipstick NEGATIVE NEGATIVE   Bilirubin Urine NEGATIVE NEGATIVE   Ketones, ur NEGATIVE NEGATIVE mg/dL   Protein, ur NEGATIVE NEGATIVE mg/dL   Nitrite NEGATIVE NEGATIVE   Leukocytes,Ua NEGATIVE NEGATIVE    Comment: Performed at [facility], [address], [place], [place] [address]  SARS Coronavirus 2 by RT PCR (hospital order, performed in [facility] hospital lab) Nasopharyngeal Nasopharyngeal Swab     Status: None   Collection Time: 12/17/19  3:39 AM   Specimen: Nasopharyngeal Swab  Result Value Ref Range   SARS Coronavirus 2 NEGATIVE NEGATIVE    Comment: (NOTE) SARS-CoV-2 target nucleic acids are NOT DETECTED.  The SARS-CoV-2 RNA is generally detectable in upper and lower respiratory specimens during the acute phase of infection. The lowest concentration of SARS-CoV-2 viral copies this assay can detect is 250 copies / mL. A negative result does not preclude SARS-CoV-2 infection and should not be used as the sole basis for treatment or other patient management decisions.  A negative result may occur with improper specimen collection / handling, submission of specimen other than nasopharyngeal swab, presence of viral mutation(s) within the areas targeted by this assay, and inadequate number of viral copies (<250 copies / mL). A negative result must be combined with clinical observations, patient history, and epidemiological information.  Fact Sheet for Patients:   BoilerBrush.com.cy  Fact Sheet for Healthcare  Providers: https://pope.com/  This test is not yet approved or  cleared by the [place] FDA and has been authorized for detection and/or diagnosis of SARS-CoV-2 by FDA under an Emergency Use Authorization (EUA).  This EUA will remain in effect (meaning this test can be used) for the duration of the COVID-19 declaration under Section 564(b)(1) of the Act, 21 U.S.C. section 360bbb-3(b)(1), unless the authorization is terminated or revoked sooner.  Performed at [facility], [address], [place], [place] [address]   Troponin I (High Sensitivity)     Status: Abnormal   Collection Time: 12/17/19  4:29 AM  Result Value Ref Range   Troponin I (High Sensitivity) 22 (H) <18 ng/L    Comment: (NOTE) Elevated high sensitivity troponin I (hsTnI) values and significant  changes across serial measurements may suggest ACS but many other  chronic and acute conditions are known to elevate hsTnI results.  Refer to the "Links" section for chest pain algorithms and additional  guidance. Performed at [facility], [address] [place], [place] [address]   Basic metabolic panel     Status: Abnormal   Collection Time: 12/17/19  4:29 AM  Result Value Ref Range   Sodium 145 135 - 145 mmol/L   Potassium 4.6 3.5 - 5.1 mmol/L   Chloride 113 (H) 98 - 111 mmol/L   CO2 24 22 - 32 mmol/L   Glucose, Bld 90 70 - 99 mg/dL    Comment: Glucose reference range applies only to samples taken after fasting for at least 8 hours.   BUN 38 (H) 8 - 23 mg/dL   Creatinine, Ser 3.41 (H) 0.61 - 1.24 mg/dL   Calcium 8.6 (L) 8.9 - 10.3 mg/dL   GFR calc non Af Amer 43 (L) >60 mL/min  GFR calc Af Amer [name] (L) >60 mL/min   Anion gap 8 5 - 15    Comment: Performed at [facility], [address] [place], [place] [address]  Magnesium     Status: None   Collection Time: 12/17/19  4:29 AM  Result Value Ref Range   Magnesium 2.4 1.7 - 2.4 mg/dL    Comment: Performed at [facility], [address], [place], [place] [address]  Brain natriuretic peptide     Status: Abnormal   Collection Time: 12/17/19  4:29 AM  Result Value Ref Range   B Natriuretic Peptide 587.5 (H) 0.0 - 100.0 pg/mL    Comment: Performed at [facility], [address] [place], [place] [address]    CT ABDOMEN PELVIS W CONTRAST  Result Date: 12/17/2019 CLINICAL DATA:  Abdominal distension. Diarrhea. Vomiting. Recent hernia surgery. EXAM: CT ABDOMEN AND PELVIS WITH CONTRAST TECHNIQUE: Multidetector CT imaging of the abdomen and pelvis was performed using the standard protocol following bolus administration of intravenous contrast. CONTRAST:  OMNIPAQUE IOHEXOL 300 MG/ML  SOLN COMPARISON:  Most recent CT 09/04/2019 FINDINGS: Lower chest: Descending thoracic aortic stent graft is partially included. Stable dimensions of the distal thoracic aorta. Multi chamber cardiomegaly. Mitral annulus calcifications. Coronary artery calcifications. No pleural effusion or acute basilar airspace disease. Hepatobiliary: No focal hepatic lesion. Layering sludge and stones in the gallbladder without pericholecystic inflammation or biliary dilatation. Pancreas: Parenchymal atrophy. No ductal dilatation or inflammation. Spleen: Normal in size without focal abnormality. Adrenals/Urinary Tract: Normal adrenal glands. No hydronephrosis or perinephric edema. Homogeneous renal enhancement with symmetric excretion on delayed phase imaging. There are small bilateral renal cysts. Urinary bladder is physiologically distended without wall thickening. Mass effect on the bladder base from enlarged prostate gland. Stomach/Bowel: Recurrent left inguinal hernia which currently contains short segment of sigmoid colon. Colon proximal to the hernia is diffusely and prominently dilated and fluid-filled with air-fluid levels. Minimal stranding in the hernia sac with small amount of free fluid. There is no definite colonic wall  thickening. More distal sigmoid colon is less distended with formed stool. Colonic diverticulosis without focal diverticulitis. The cecum is located in the right mid abdomen, containing fluid and dependent density, possibly ingested contents or contrast. There is no significant small bowel dilatation. There is no pneumatosis. Enteric contrast within the distal esophagus with moderate hiatal hernia. Stomach is not abnormally distended. Vascular/Lymphatic: Aortic atherosclerosis. 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 Moderate bi-iliac atherosclerosis. Patent portal vein. Patent mesenteric vessels. No portal venous gas. No bulky abdominopelvic adenopathy. Reproductive: Enlarged prostate gland spans 5 cm AP with nodular mass effect on the bladder base. Other: Left inguinal hernia, apparently recurrent given history of recent hernia repair. Hernia contains short segment of sigmoid colon. There is fluid in the dependent aspect of the hernia sac. Mild mesenteric and soft tissue stranding in the hernia. No evidence of free air. Small fat containing right inguinal hernia. Small fat containing umbilical hernia. Musculoskeletal: Scoliosis and multilevel degenerative change in the spine. There are no acute or suspicious osseous abnormalities. IMPRESSION: 1. Recurrent left inguinal hernia which currently contains short segment of sigmoid colon. Colon proximal to the hernia is diffusely dilated and fluid-filled with air-fluid levels, suspicious for partial colonic obstruction. Minimal stranding in the hernia sac with small amount of free fluid. No definite colonic wall thickening or evidence of ischemia. 2. Floppy cecum currently located in the right mid abdomen. 3. Colonic diverticulosis without  focal diverticulitis. 4. Enlarged prostate gland with nodular mass effect on the bladder base. 5. Moderate hiatal hernia. Enteric contrast within the distal esophagus, can be seen with vomiting, reflux or delayed transit.  6. Gallstones without pericholecystic inflammation. Aortic Atherosclerosis (ICD10-I70.0). Electronically Signed   By: Narda Rutherford M.D.   On: 12/17/2019 02:10   DG CHEST PORT 1 VIEW  Result Date: 12/17/2019 CLINICAL DATA:  Rales EXAM: PORTABLE CHEST 1 VIEW COMPARISON:  Fifteen days ago FINDINGS: Cardiomegaly. Tortuous aorta with stent grafting. Peripheral density at the left base which is likely fat based on CT earlier today. No edema, consolidation, or pneumothorax. IMPRESSION: No active disease.  No pulmonary edema. Electronically Signed   By: Marnee Spring M.D.   On: 12/17/2019 04:36    Review of Systems  Gastrointestinal: Positive for abdominal distention, diarrhea and nausea.   Blood pressure (!) 101/51, pulse 84, temperature 98.7 F (37.1 C), temperature source Oral, resp. rate 17, SpO2 98 %. General nad cv reg rate Lungs clear bilaterally Ab distended, nontender, left groin with recurrent hernia not reducible nontender Ext no edema  Assessment/Plan: Recurrent LIH with resulting SBO -will have ng placed in er, last doac dose was 24 hours ago -I think will need to go to OR for re-exploration and will discuss with [name] [name] this am -discussed plan with [name] [name] and his wife  [name] [name] 12/17/2019, 6:50 AM

## 2019-12-17 NOTE — Progress Notes (Addendum)
Patient ID: [name] [name], male   DOB: 1940-07-29, 79 y.o.   MRN: [id number] Discussed at length with Dr. Dwain Sarna. Early recurrence of large LIH with partial colonic obstruction. I have also D/W my partner, Dr. Andrey Campanile, and we feel a laparoscopic transperitoneal approach will be the best option. Will plan for tomorrow by Dr. Andrey Campanile and I will assist. I called his daughter, [name] [name], and discussed the plan in detail. I will go by and speak with [name] [name] and his wife later this AM. Hold DOAC. NGT to LIWS. NPO except ice.  [name] Gelinas, MD, MPH, FACS Please use [email or web address] to contact on call provider

## 2019-12-17 NOTE — ED Provider Notes (Signed)
Patient signed out to me by Dr. Silverio Lay to follow-up on CT scan.  Patient is 2 weeks status post left inguinal hernia repair.  He has been experiencing progressive abdominal distention with nausea.  CT scan shows recurrent left inguinal hernia with large intestine contained in the hernia with partial obstruction.  Patient's vital signs had been normalized after IV fluids (was hypotensive initially).  Blood pressure is dropping.  Will administer additional IV fluids as he does have evidence of dehydration and acute kidney injury.  Discussed with Dr. Dwain Sarna, on-call for general surgery.  Recommends medicine admission for further cardiac monitoring, rehydration.  Will need to hold Eliquis prior to any surgical intervention.  Surgery will see the patient.   Gilda Crease, MD 12/17/19 [phone]

## 2019-12-17 NOTE — Progress Notes (Signed)
Subjective/Chief Complaint: Feeling somewhat better with NGT Feels like he is about to have a BM   Objective: Vital signs in last 24 hours: Temp:  [97.5 F (36.4 C)-98.7 F (37.1 C)] 98.7 F (37.1 C) (06/30 0536) Pulse Rate:  [35-94] 87 (06/30 1010) Resp:  [10-27] 22 (06/30 1010) BP: (80-147)/(43-69) 115/59 (06/30 1010) SpO2:  [91 %-100 %] 95 % (06/30 1010)    Intake/Output from previous day: No intake/output data recorded. Intake/Output this shift: No intake/output data recorded.  General appearance: alert and cooperative Nose: NGT Resp: few rales, RR30 Cardio: irregularly irregular rhythm and mech sd GI: some distention, LIH partially reduces, incision OK, no gen tenderness  Lab Results:  Recent Labs    12/16/19 1841  WBC 9.9  HGB 11.8*  HCT 39.1  PLT 369   BMET Recent Labs    12/16/19 1841 12/17/19 0429  NA 144 145  K 4.4 4.6  CL 108 113*  CO2 23 24  GLUCOSE 114* 90  BUN 41* 38*  CREATININE 1.72* 1.52*  CALCIUM 9.4 8.6*   PT/INR No results for input(s): LABPROT, INR in the last 72 hours. ABG No results for input(s): PHART, HCO3 in the last 72 hours.  Invalid input(s): PCO2, PO2  Studies/Results: CT ABDOMEN PELVIS W CONTRAST  Result Date: 12/17/2019 CLINICAL DATA:  Abdominal distension. Diarrhea. Vomiting. Recent hernia surgery. EXAM: CT ABDOMEN AND PELVIS WITH CONTRAST TECHNIQUE: Multidetector CT imaging of the abdomen and pelvis was performed using the standard protocol following bolus administration of intravenous contrast. CONTRAST:  OMNIPAQUE IOHEXOL 300 MG/ML  SOLN COMPARISON:  Most recent CT 09/04/2019 FINDINGS: Lower chest: Descending thoracic aortic stent graft is partially included. Stable dimensions of the distal thoracic aorta. Multi chamber cardiomegaly. Mitral annulus calcifications. Coronary artery calcifications. No pleural effusion or acute basilar airspace disease. Hepatobiliary: No focal hepatic lesion. Layering sludge and  stones in the gallbladder without pericholecystic inflammation or biliary dilatation. Pancreas: Parenchymal atrophy. No ductal dilatation or inflammation. Spleen: Normal in size without focal abnormality. Adrenals/Urinary Tract: Normal adrenal glands. No hydronephrosis or perinephric edema. Homogeneous renal enhancement with symmetric excretion on delayed phase imaging. There are small bilateral renal cysts. Urinary bladder is physiologically distended without wall thickening. Mass effect on the bladder base from enlarged prostate gland. Stomach/Bowel: Recurrent left inguinal hernia which currently contains short segment of sigmoid colon. Colon proximal to the hernia is diffusely and prominently dilated and fluid-filled with air-fluid levels. Minimal stranding in the hernia sac with small amount of free fluid. There is no definite colonic wall thickening. More distal sigmoid colon is less distended with formed stool. Colonic diverticulosis without focal diverticulitis. The cecum is located in the right mid abdomen, containing fluid and dependent density, possibly ingested contents or contrast. There is no significant small bowel dilatation. There is no pneumatosis. Enteric contrast within the distal esophagus with moderate hiatal hernia. Stomach is not abnormally distended. Vascular/Lymphatic: Aortic atherosclerosis. 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 Moderate bi-iliac atherosclerosis. Patent portal vein. Patent mesenteric vessels. No portal venous gas. No bulky abdominopelvic adenopathy. Reproductive: Enlarged prostate gland spans 5 cm AP with nodular mass effect on the bladder base. Other: Left inguinal hernia, apparently recurrent given history of recent hernia repair. Hernia contains short segment of sigmoid colon. There is fluid in the dependent aspect of the hernia sac. Mild mesenteric and soft tissue stranding in the hernia. No evidence of free air. Small fat containing right inguinal hernia.  Small fat containing umbilical hernia. Musculoskeletal:  Scoliosis and multilevel degenerative change in the spine. There are no acute or suspicious osseous abnormalities. IMPRESSION: 1. Recurrent left inguinal hernia which currently contains short segment of sigmoid colon. Colon proximal to the hernia is diffusely dilated and fluid-filled with air-fluid levels, suspicious for partial colonic obstruction. Minimal stranding in the hernia sac with small amount of free fluid. No definite colonic wall thickening or evidence of ischemia. 2. Floppy cecum currently located in the right mid abdomen. 3. Colonic diverticulosis without focal diverticulitis. 4. Enlarged prostate gland with nodular mass effect on the bladder base. 5. Moderate hiatal hernia. Enteric contrast within the distal esophagus, can be seen with vomiting, reflux or delayed transit. 6. Gallstones without pericholecystic inflammation. Aortic Atherosclerosis (ICD10-I70.0). Electronically Signed   By: Narda Rutherford M.D.   On: 12/17/2019 02:10   DG CHEST PORT 1 VIEW  Result Date: 12/17/2019 CLINICAL DATA:  Rales EXAM: PORTABLE CHEST 1 VIEW COMPARISON:  Fifteen days ago FINDINGS: Cardiomegaly. Tortuous aorta with stent grafting. Peripheral density at the left base which is likely fat based on CT earlier today. No edema, consolidation, or pneumothorax. IMPRESSION: No active disease.  No pulmonary edema. Electronically Signed   By: Marnee Spring M.D.   On: 12/17/2019 04:36   DG Abd Portable 1 View  Result Date: 12/17/2019 CLINICAL DATA:  Post NG tube placement EXAM: PORTABLE ABDOMEN - 1 VIEW COMPARISON:  Chest x-ray 12/17/2019 FINDINGS: Status post endovascular repair of the thoracic aorta and median sternotomy incidentally noted. Gastric tube in the stomach, tip in the fundus. Side port below GE junction. Distended loops of bowel are partially visualized in the upper abdomen. Abdominal plain film excludes portions of the LEFT flank and pelvis.  Visualized skeletal structures without acute process. IMPRESSION: 1. Gastric tube in the stomach. 2. Distended loops of bowel are partially visualized in the upper abdomen. Abdominal plain film excludes portions of the LEFT flank and pelvis. Electronically Signed   By: Donzetta Kohut M.D.   On: 12/17/2019 07:57    Anti-infectives: Anti-infectives (From admission, onward)   None      Assessment/Plan: Recurrent LIH with colonic obstruction - appreciate [facility] admit and management. I have asked Cardiology to consult as well. Plan laparoscopic repair by Dr. Andrey Campanile tomorrow and I will assist. Continue NGT, may have ice chips. Hold DOAC. I spoke with his wife at the bedside as well. They are appreciative of our care.   Liz Malady 12/17/2019

## 2019-12-17 NOTE — ED Notes (Signed)
Condom cath. Applied to pt.

## 2019-12-17 NOTE — ED Notes (Signed)
Patient transported to CT 

## 2019-12-17 NOTE — Consult Note (Addendum)
Cardiology Consultation:   Patient ID: [name] [name]; [id number]; 12-06-1940   Admit date: 12/16/2019 Date of Consult: 12/17/2019  Primary Care Provider: Casper Harrison, [name], MD Primary Cardiologist: Lance Muss, MD 10/16/2019 Primary Electrophysiologist:  None   Patient Profile:   [name] [name] is a 79 y.o. 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   He is being seen today for the evaluation of CHF preop at the request of Dr Blake Divine.  History of Present Illness:   Mr. [name] was hospitalized 6/14-6/22/2021 for repair of chronically incarcerated left inguinal hernia.  He required admission preoperatively for diuresis and cardiac optimization.  He was admitted 6/29 with diarrhea x 5 days, vomiting x2 days, and abdominal pain.  Systolic blood pressure in the 80s on arrival.  He was felt to have a partial colon obstruction as well as AKI with prerenal azotemia from dehydration with a creatinine of 1.7 and BUN 41.  He received 2 L of IV fluid.  CT showed recurrent left inguinal hernia with large intestine likely present.  He was seen by the surgeons and felt he had SBO because of the hernia.  His Eliquis was held and plans were made for surgery.  The plan is for laparoscopic repair.  Eliquis was held.  Cardiology was asked to evaluate him.  Mr. [name] had been improving after his recent discharge, until about 5 days ago.  He developed diarrhea which gradually got worse.  Yesterday, he started vomiting as well.  He has abdominal distention and tenderness.  He has not had any chest pain.  He has not had any lower extremity edema.  He denies orthopnea or PND.  Because of the diarrhea and vomiting, his weight is down and he has  not been able to take his meds as usual.  Currently, he feels a little bit better because he has an NG tube to suction and is getting IV fluids.  He has had Zofran for nausea.    Past Medical History:  Diagnosis Date  . Aortic valve insufficiency   . Arthritis    "probably in my knees before they replaced them" (01/28/2015)  . Atrial flutter (HCC)   . CAD (coronary artery disease)   . CHF (congestive heart failure) (HCC)   . Descending aortic aneurysm (HCC)   . DVT (deep venous thrombosis) (HCC)    ?LLE post knee surgery  . Dyspnea 09/2019  . History of blood transfusion    "after one of my knee surgeries"  . HTN (hypertension)   . Hyperlipidemia   . Thoracic aneurysm   . Tobacco abuse     Past Surgical History:  Procedure Laterality Date  . AORTIC VALVE REPLACEMENT  02/19/2009  . AORTO-FEMORAL BYPASS GRAFT  03/2010   ascending aortic and arch aneurysm repair/notes 04/09/2009  . CARDIAC VALVE REPLACEMENT    . CAROTID-SUBCLAVIAN BYPASS GRAFT Left 08/26/2013   Procedure: BYPASS GRAFT CAROTID-SUBCLAVIAN;  Surgeon: Nada Libman, MD;  Location: [facility] OR;  Service: Vascular;  Laterality: Left;  . CATARACT EXTRACTION W/ INTRAOCULAR LENS  IMPLANT, BILATERAL Bilateral   . ENDOVASCULAR STENT INSERTION N/A 08/26/2013   Procedure:  THORACIC STENT GRAFT INSERTION;  Surgeon: Nada Libman, MD;  Location: [facility] OR;  Service: Vascular;  Laterality:  N/A;  . EYE SURGERY    . INGUINAL HERNIA REPAIR Left 12/02/2019   Procedure: REPAIR LEFT INGUINAL HERNIA WITH MESH;  Surgeon: Violeta Gelinas, MD;  Location: [facility] OR;  Service: General;  Laterality: Left;  . INSERTION OF MESH Left 12/02/2019   Procedure: INSERTION OF MESH;  Surgeon: Violeta Gelinas, MD;  Location: [facility] OR;  Service: General;  Laterality: Left;  . JOINT REPLACEMENT    . KNEE ARTHROSCOPY Bilateral   . REPLACEMENT TOTAL KNEE Bilateral   . right groin lymphocele Right 03/29/2009  . RIGHT/LEFT HEART CATH AND CORONARY ANGIOGRAPHY N/A  10/06/2019   Procedure: RIGHT/LEFT HEART CATH AND CORONARY ANGIOGRAPHY;  Surgeon: Lyn Records, MD;  Location: [facility] INVASIVE CV LAB;  Service: Cardiovascular;  Laterality: N/A;  . THORACENTESIS  2010 X 2  . TONSILLECTOMY       Prior to Admission medications   Medication Sig Start Date End Date Taking? Authorizing Provider  acetaminophen (TYLENOL) 500 MG tablet Take 2 tablets (1,000 mg total) by mouth every 6 (six) hours as needed. Patient taking differently: Take 1,000 mg by mouth every 6 (six) hours as needed for mild pain or moderate pain.  09/08/19  Yes Barnetta Chapel, PA-C  amiodarone (PACERONE) 200 MG tablet Take 1 tablet (200 mg total) by mouth daily. 05/29/19  Yes [name], MD  apixaban (ELIQUIS) 5 MG TABS tablet Take 1 tablet (5 mg total) by mouth 2 (two) times daily. 05/29/19  Yes [name], MD  atorvastatin (LIPITOR) 10 MG tablet Take 1 tablet by mouth once daily Patient taking differently: Take 10 mg by mouth daily.  06/16/19  Yes [name], MD  ferrous sulfate 325 (65 FE) MG tablet Take 1 tablet (325 mg total) by mouth daily with breakfast. 10/16/19  Yes [name], MD  furosemide (LASIX) 40 MG tablet Take 1 tablet by mouth twice daily Patient taking differently: Take 40 mg by mouth 2 (two) times daily.  06/02/19  Yes [name], MD  lansoprazole (PREVACID) 30 MG capsule Take 30 mg by mouth daily. 08/13/19  Yes [provider]  loratadine (CLARITIN) 10 MG tablet Take 1 tablet (10 mg total) by mouth daily. 06/01/14  Yes [name], MD  losartan (COZAAR) 25 MG tablet Take 1 tablet (25 mg total) by mouth daily. 11/03/19  Yes [name], MD  methocarbamol (ROBAXIN) 500 MG tablet Take 2 tablets (1,000 mg total) by mouth every 8 (eight) hours as needed for muscle spasms. 09/08/19  Yes Barnetta Chapel, PA-C  metoprolol succinate (TOPROL-XL) 100 MG 24 hr tablet Take 1 tablet (100 mg total) by mouth daily. Take with  or immediately following a meal. 10/16/19  Yes [name], MD  Multiple Vitamin (MULTIVITAMIN WITH MINERALS) TABS tablet Take 1 tablet by mouth daily.   Yes [provider]  nitroGLYCERIN (NITROSTAT) 0.4 MG SL tablet Place 1 tablet (0.4 mg total) under the tongue every 5 (five) minutes as needed for chest pain (X3 DOSES BEFORE CALLING 911). 05/29/19  Yes [name], MD  potassium chloride SA (KLOR-CON) 20 MEQ tablet Take 2 tablets by mouth once daily Patient taking differently: Take 40 mEq by mouth daily.  06/16/19  Yes [name], MD  spironolactone (ALDACTONE) 25 MG tablet Take 0.5 tablets (12.5 mg total) by mouth daily. 10/16/19  Yes [name], MD  traMADol (ULTRAM) 50 MG tablet Take 1 tablet (50 mg total) by mouth every 6 (six) hours as needed  for severe pain. 12/03/19  Yes Violeta Gelinas, MD    Inpatient Medications: Scheduled Meds: . sodium chloride flush  3 mL Intravenous Once   Continuous Infusions:  PRN Meds: prochlorperazine  Allergies:    Allergies  Allergen Reactions  . Lortab [Hydrocodone-Acetaminophen] Hives    No trouble breathing  . Morphine And Related Hives  . Other Other (See Comments)    Staples from surgery caused infection    Social History:   Social History   Socioeconomic History  . Marital status: Married    Spouse name: Not on file  . Number of children: Not on file  . Years of education: Not on file  . Highest education level: Not on file  Occupational History  . Not on file  Tobacco Use  . Smoking status: Former Smoker    Packs/day: 0.00    Types: Cigarettes    Quit date: 04/16/1974    Years since quitting: 45.7  . Smokeless tobacco: Never Used  Vaping Use  . Vaping Use: Never used  Substance and Sexual Activity  . Alcohol use: No  . Drug use: No  . Sexual activity: Not Currently  Other Topics Concern  . Not on file  Social History Narrative  . Not on file   Social Determinants of  Health   Financial Resource Strain:   . Difficulty of Paying Living Expenses:   Food Insecurity:   . Worried About Programme researcher, broadcasting/film/video in the Last Year:   . Barista in the Last Year:   Transportation Needs:   . Freight forwarder (Medical):   Marland Kitchen Lack of Transportation (Non-Medical):   Physical Activity:   . Days of Exercise per Week:   . Minutes of Exercise per Session:   Stress:   . Feeling of Stress :   Social Connections:   . Frequency of Communication with Friends and Family:   . Frequency of Social Gatherings with Friends and Family:   . Attends Religious Services:   . Active Member of Clubs or Organizations:   . Attends Banker Meetings:   Marland Kitchen Marital Status:   Intimate Partner Violence:   . Fear of Current or Ex-Partner:   . Emotionally Abused:   Marland Kitchen Physically Abused:   . Sexually Abused:     Family History:   Family History  Problem Relation Age of Onset  . Celiac disease Daughter   . Aortic aneurysm Father   . Hypertension Mother   . Heart attack Neg Hx   . Stroke Neg Hx    Family Status:  Family Status  Relation Name Status  . Daughter  Alive  . Father  Deceased  . Mother  Deceased  . MGM  Deceased  . MGF  Deceased  . PGM  Deceased  . PGF  Deceased  . Neg Hx  (Not Specified)    ROS:  Please see the history of present illness.  All other ROS reviewed and negative.     Physical Exam/Data:   Vitals:   12/17/19 0518 12/17/19 0536 12/17/19 0659 12/17/19 1010  BP:  (!) 101/51 (!) 118/52 (!) 115/59  Pulse: 85 84 61 87  Resp: 11 17 17  (!) 22  Temp:  98.7 F (37.1 C)    TempSrc:  Oral Oral   SpO2: 97% 98% 94% 95%   No intake or output data in the 24 hours ending 12/17/19 1417  Last 3 Weights 12/03/2019 12/02/2019 12/02/2019  Weight (lbs) 172 lb  6.4 oz 176 lb 2.4 oz 176 lb 3.2 oz  Weight (kg) 78.2 kg 79.9 kg 79.924 kg     There is no height or weight on file to calculate BMI.   General:  Well nourished, well developed, male  appears uncomfortable HEENT: normal Lymph: no adenopathy Neck: JVD -not elevated Endocrine:  No thryomegaly Vascular: No carotid bruits; 4/4 extremity pulses 2+  Cardiac:  normal S1, S2; slightly irregular rate and rhythm; no murmur Lungs:  clear bilaterally, no wheezing, rhonchi or rales  Abd: soft, nontender, no hepatomegaly  Ext: no edema Musculoskeletal:  No deformities, BUE and BLE strength normal and equal Skin: warm and dry  Neuro:  CNs 2-12 intact, no focal abnormalities noted Psych:  Normal affect   EKG:  The EKG was personally reviewed and demonstrates: 6/29 ECG is possible wandering atrial pacemaker versus slow atrial flutter, heart rate 94, left bundle branch block morphology with QRS duration 156 ms, inferolateral ST and T wave abnormalities are similar to 12/01/2019 ECG and 10/04/2019 ECG Telemetry:  Telemetry was personally reviewed and demonstrates: Reviewed with [name], appears to be slow atrial flutter with variable AV block   CV studies:   ECHO: 10/03/2019 1. Diffuse hypokinesis worse in the septum, apex and akinesis of inferior  base . Left ventricular ejection fraction, by estimation, is 25 to 30%.  The left ventricle has severely decreased function. The left ventricle  demonstrates global hypokinesis. The  left ventricular internal cavity size was mildly dilated. Left  ventricular diastolic parameters are indeterminate.  2. Right ventricular systolic function is normal. The right ventricular  size is normal. There is normal pulmonary artery systolic pressure.  3. Left atrial size was moderately dilated.  4. Right atrial size was moderately dilated.  5. The mitral valve is degenerative. Mild mitral valve regurgitation.  6. By history bioprosthetic AVR with trivial central AR and acceptable  mean gradient of 7 mmHg . The aortic valve has been repaired/replaced.  Aortic valve regurgitation is trivial. No aortic stenosis is present.  There is a unknown  a bioprosthetic valve  present in the aortic position.  7. ? dilated descending thoracic aorta seen on PsL views.   CATH: 10/06/2019  Chronic total occlusion of the mid right coronary, collateralized right to right and left to right.  Widely patent left main.  Widely patent LAD with moderate luminal irregularities proximal to distal.  Widely patent stent in the proximal to mid circumflex with up to 50% narrowing distal to the stent.  Moderate pulmonary hypertension.  Mean pulmonary artery pressure 35 mmHg.  Mean pulmonary capillary wedge pressure 32 mmHg.  RECOMMENDATIONS:   Left ventricular systolic dysfunction is not on the basis of coronary disease.  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 Diagnostic Dominance: Right     Laboratory Data:   Chemistry Recent Labs  Lab 12/16/19 1841 12/17/19 0429  NA 144 145  K 4.4 4.6  CL 108 113*  CO2 23 24  GLUCOSE 114* 90  BUN 41* 38*  CREATININE 1.72* 1.52*  CALCIUM 9.4 8.6*  GFRNONAA 37* 43*  GFRAA 43* 49*  ANIONGAP 13 8    Lab Results  Component Value Date   ALT 24 12/16/2019   AST 34 12/16/2019   ALKPHOS 56 12/16/2019   BILITOT 0.7 12/16/2019   Hematology Recent Labs  Lab 12/16/19 1841  WBC 9.9  RBC 3.89*  HGB 11.8*  HCT 39.1  MCV 100.5*  MCH 30.3  MCHC 30.2  RDW 19.4*  PLT 369  Cardiac Enzymes High Sensitivity Troponin:   Recent Labs  Lab 12/16/19 2230 12/17/19 0013 12/17/19 0429  TROPONINIHS 17 20* 22*      BNP Recent Labs  Lab 12/17/19 0429  BNP 587.5*     TSH:  Lab Results  Component Value Date   TSH 1.342 10/04/2019   Lipids: Lab Results  Component Value Date   CHOL 152 10/10/2013   HDL 38 (L) 07/29/2010   LDLCALC  07/29/2010    80        Total Cholesterol/HDL:CHD Risk Coronary Heart Disease Risk Table                     Men   Women  1/2 Average Risk   3.4   3.3  Average Risk       5.0   4.4  2 X Average Risk   9.6   7.1  3 X Average Risk  23.4   11.0        Use  the calculated Patient Ratio above and the CHD Risk Table to determine the patient's CHD Risk.        ATP III CLASSIFICATION (LDL):  <100     mg/dL   Optimal  106-269  mg/dL   Near or Above                    Optimal  130-159  mg/dL   Borderline  485-462  mg/dL   High  >703     mg/dL   Very High   TRIG 500 10/07/2013   CHOLHDL 3.6 07/29/2010   HgbA1c: Lab Results  Component Value Date   HGBA1C 6.1 (H) 08/27/2013   Magnesium:  Magnesium  Date Value Ref Range Status  12/17/2019 2.4 1.7 - 2.4 mg/dL Final    Comment:    Performed at [facility], [address], [place], [place] [address]     Radiology/Studies:  CT ABDOMEN PELVIS W CONTRAST  Result Date: 12/17/2019 CLINICAL DATA:  Abdominal distension. Diarrhea. Vomiting. Recent hernia surgery. EXAM: CT ABDOMEN AND PELVIS WITH CONTRAST TECHNIQUE: Multidetector CT imaging of the abdomen and pelvis was performed using the standard protocol following bolus administration of intravenous contrast. CONTRAST:  OMNIPAQUE IOHEXOL 300 MG/ML  SOLN COMPARISON:  Most recent CT 09/04/2019 FINDINGS: Lower chest: Descending thoracic aortic stent graft is partially included. Stable dimensions of the distal thoracic aorta. Multi chamber cardiomegaly. Mitral annulus calcifications. Coronary artery calcifications. No pleural effusion or acute basilar airspace disease. Hepatobiliary: No focal hepatic lesion. Layering sludge and stones in the gallbladder without pericholecystic inflammation or biliary dilatation. Pancreas: Parenchymal atrophy. No ductal dilatation or inflammation. Spleen: Normal in size without focal abnormality. Adrenals/Urinary Tract: Normal adrenal glands. No hydronephrosis or perinephric edema. Homogeneous renal enhancement with symmetric excretion on delayed phase imaging. There are small bilateral renal cysts. Urinary bladder is physiologically distended without wall thickening. Mass effect on the bladder base from enlarged  prostate gland. Stomach/Bowel: Recurrent left inguinal hernia which currently contains short segment of sigmoid colon. Colon proximal to the hernia is diffusely and prominently dilated and fluid-filled with air-fluid levels. Minimal stranding in the hernia sac with small amount of free fluid. There is no definite colonic wall thickening. More distal sigmoid colon is less distended with formed stool. Colonic diverticulosis without focal diverticulitis. The cecum is located in the right mid abdomen, containing fluid and dependent density, possibly ingested contents or contrast. There is no significant small bowel dilatation. There  is no pneumatosis. Enteric contrast within the distal esophagus with moderate hiatal hernia. Stomach is not abnormally distended. Vascular/Lymphatic: Aortic atherosclerosis. 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 Moderate bi-iliac atherosclerosis. Patent portal vein. Patent mesenteric vessels. No portal venous gas. No bulky abdominopelvic adenopathy. Reproductive: Enlarged prostate gland spans 5 cm AP with nodular mass effect on the bladder base. Other: Left inguinal hernia, apparently recurrent given history of recent hernia repair. Hernia contains short segment of sigmoid colon. There is fluid in the dependent aspect of the hernia sac. Mild mesenteric and soft tissue stranding in the hernia. No evidence of free air. Small fat containing right inguinal hernia. Small fat containing umbilical hernia. Musculoskeletal: Scoliosis and multilevel degenerative change in the spine. There are no acute or suspicious osseous abnormalities. IMPRESSION: 1. Recurrent left inguinal hernia which currently contains short segment of sigmoid colon. Colon proximal to the hernia is diffusely dilated and fluid-filled with air-fluid levels, suspicious for partial colonic obstruction. Minimal stranding in the hernia sac with small amount of free fluid. No definite colonic wall thickening or evidence of  ischemia. 2. Floppy cecum currently located in the right mid abdomen. 3. Colonic diverticulosis without focal diverticulitis. 4. Enlarged prostate gland with nodular mass effect on the bladder base. 5. Moderate hiatal hernia. Enteric contrast within the distal esophagus, can be seen with vomiting, reflux or delayed transit. 6. Gallstones without pericholecystic inflammation. Aortic Atherosclerosis (ICD10-I70.0). Electronically Signed   By: Narda Rutherford M.D.   On: 12/17/2019 02:10   DG CHEST PORT 1 VIEW  Result Date: 12/17/2019 CLINICAL DATA:  Rales EXAM: PORTABLE CHEST 1 VIEW COMPARISON:  Fifteen days ago FINDINGS: Cardiomegaly. Tortuous aorta with stent grafting. Peripheral density at the left base which is likely fat based on CT earlier today. No edema, consolidation, or pneumothorax. IMPRESSION: No active disease.  No pulmonary edema. Electronically Signed   By: Marnee Spring M.D.   On: 12/17/2019 04:36   DG Abd Portable 1 View  Result Date: 12/17/2019 CLINICAL DATA:  Post NG tube placement EXAM: PORTABLE ABDOMEN - 1 VIEW COMPARISON:  Chest x-ray 12/17/2019 FINDINGS: Status post endovascular repair of the thoracic aorta and median sternotomy incidentally noted. Gastric tube in the stomach, tip in the fundus. Side port below GE junction. Distended loops of bowel are partially visualized in the upper abdomen. Abdominal plain film excludes portions of the LEFT flank and pelvis. Visualized skeletal structures without acute process. IMPRESSION: 1. Gastric tube in the stomach. 2. Distended loops of bowel are partially visualized in the upper abdomen. Abdominal plain film excludes portions of the LEFT flank and pelvis. Electronically Signed   By: Donzetta Kohut M.D.   On: 12/17/2019 07:57    Assessment and Plan:   1.  Preop cardiovascular examination -His RCRI score is 2 giving him a 6.6% perioperative risk of major cardiac events -He is a little dehydrated, no acute CHF on exam -No acute ischemic  symptoms despite chronically occluded RCA -He is at acceptable risk for the planned procedure -Agree with holding his Eliquis, restart when okay from a surgical standpoint  2.  Chronic systolic CHF -His volume status is good by exam. -Hold the Lasix for now, monitor volume status carefully -He will need IV fluids because he is n.p.o., start normal saline at 50 cc/h. -This will give him fluid intake of 1200 cc in 24 hours -Once he has the surgery, can cut the IV fluids down to [facility] when he is taking p.o.'s, then discontinue -Lasix, losartan,  and spironolactone are on hold for now  3.  Atrial flutter -He appears to be in atrial flutter on telemetry with variable ventricular response -Prior to admission, he was on Toprol-XL 100 mg daily -Because he has been hypotensive, wish to keep the beta-blocker dose low and it needs to be IV while he is n.p.o. -Start metoprolol 2.5 mg IV every 6 hours, and will write for extra doses to be used as needed in case his heart rate increases -Resume anticoagulation when safe after surgery  4.  CAD: -No ongoing ischemic symptoms despite chronically occluded RCA -Give beta-blocker IV for now. -Okay to hold Lipitor, losartan and other cardiac meds -If he has some angina, can always use nitro ointment, but is not currently taking any oral nitrates at home.  5. ABNORMAL PROSTATE -See CT report. -Will check a PSA, -If abnormal, may need to follow-up with urology -Will turn results over to IM to decide on evaluation  Otherwise, per IM/Surgery Principal Problem:   Inguinal hernia of left side with obstruction Active Problems:   Atrial fibrillation (HCC)   Chronic diastolic heart failure (HCC)   Hypotension   AKI (acute kidney injury) (HCC)     For questions or updates, please contact [facility] Please consult [email or web address] for contact info under Cardiology/STEMI.   Signed, Theodore Demark, PA-C  12/17/2019 2:17 PM  I have examined the patient  and reviewed assessment and plan and discussed with patient.  Agree with above as stated.  Patient with recurrent abdominal issues after hernia surgery earlier in the month.  Plan for surgery tomorrow.  Will give him metoprolol every 6 hours.  He appears to be in atrial flutter with variable conduction.  We will hold his other oral medicines.  Blood pressure well controlled currently.  If blood pressure becomes high, could give IV hydralazine 10 mg with repeat dose as needed.  If heart rate increases, could give additional IV metoprolol.  Will consider IV amiodarone if heart rate becomes more of an issue.  He is likely dehydrated given the history.  Okay to hydrate somewhat but excess hydration may lead to heart failure exacerbation.  Hopefully, his ejection fraction has improved since his heart rate has been better.  No other testing needed before surgery.  We will follow along.  Lance Muss

## 2019-12-18 ENCOUNTER — Inpatient Hospital Stay (HOSPITAL_COMMUNITY): Payer: Medicare Other | Admitting: Certified Registered Nurse Anesthetist

## 2019-12-18 ENCOUNTER — Encounter (HOSPITAL_COMMUNITY): Payer: Self-pay | Admitting: Internal Medicine

## 2019-12-18 ENCOUNTER — Encounter (HOSPITAL_COMMUNITY)
Admission: EM | Disposition: A | Discharge status: Home Health | Payer: Self-pay | Source: Ambulatory Visit | Attending: Internal Medicine

## 2019-12-18 HISTORY — PX: INGUINAL HERNIA REPAIR: SHX194

## 2019-12-18 LAB — CBC
HCT: 33.7 % — ABNORMAL LOW (ref 39.0–52.0)
Hemoglobin: 9.9 g/dL — ABNORMAL LOW (ref 13.0–17.0)
MCH: 30.2 pg (ref 26.0–34.0)
MCHC: 29.4 g/dL — ABNORMAL LOW (ref 30.0–36.0)
MCV: 102.7 fL — ABNORMAL HIGH (ref 80.0–100.0)
Platelets: 262 10*3/uL (ref 150–400)
RBC: 3.28 MIL/uL — ABNORMAL LOW (ref 4.22–5.81)
RDW: 19.3 % — ABNORMAL HIGH (ref 11.5–15.5)
WBC: 8.5 10*3/uL (ref 4.0–10.5)
nRBC: 0 % (ref 0.0–0.2)

## 2019-12-18 LAB — BASIC METABOLIC PANEL
Anion gap: 12 (ref 5–15)
BUN: 33 mg/dL — ABNORMAL HIGH (ref 8–23)
CO2: 19 mmol/L — ABNORMAL LOW (ref 22–32)
Calcium: 8.7 mg/dL — ABNORMAL LOW (ref 8.9–10.3)
Chloride: 117 mmol/L — ABNORMAL HIGH (ref 98–111)
Creatinine, Ser: 1.56 mg/dL — ABNORMAL HIGH (ref 0.61–1.24)
GFR calc Af Amer: 48 mL/min — ABNORMAL LOW (ref >60)
GFR calc non Af Amer: 41 mL/min — ABNORMAL LOW (ref >60)
Glucose, Bld: 76 mg/dL (ref 70–99)
Potassium: 4 mmol/L (ref 3.5–5.1)
Sodium: 148 mmol/L — ABNORMAL HIGH (ref 135–145)

## 2019-12-18 LAB — PSA: Prostatic Specific Antigen: 3.35 ng/mL (ref 0.00–4.00)

## 2019-12-18 SURGERY — REPAIR, HERNIA, INGUINAL, LAPAROSCOPIC
Anesthesia: General | Site: Inguinal | Laterality: Left

## 2019-12-18 MED ORDER — ESMOLOL HCL 100 MG/10ML IV SOLN
INTRAVENOUS | Status: DC | PRN
Start: 2019-12-18 — End: 2019-12-18
  Administered 2019-12-18: 20 mg via INTRAVENOUS

## 2019-12-18 MED ORDER — METOPROLOL TARTRATE 5 MG/5ML IV SOLN
INTRAVENOUS | Status: DC | PRN
  Administered 2019-12-18: 2 mg via INTRAVENOUS
  Administered 2019-12-18: 2.5 mg via INTRAVENOUS
  Administered 2019-12-18: .5 mg via INTRAVENOUS

## 2019-12-18 MED ORDER — HYDROMORPHONE HCL 1 MG/ML IJ SOLN: 0.2500 mg | INTRAMUSCULAR | Status: DC | PRN

## 2019-12-18 MED ORDER — BUPIVACAINE HCL (PF) 0.25 % IJ SOLN
INTRAMUSCULAR | Status: AC
  Filled 2019-12-18: qty 30

## 2019-12-18 MED ORDER — SODIUM CHLORIDE 0.9 % IV SOLN
INTRAVENOUS | Status: AC
  Filled 2019-12-18: qty 500000

## 2019-12-18 MED ORDER — PROPOFOL 10 MG/ML IV BOLUS
INTRAVENOUS | Status: DC | PRN
  Administered 2019-12-18: 80 mg via INTRAVENOUS

## 2019-12-18 MED ORDER — METHOCARBAMOL 1000 MG/10ML IJ SOLN
500.0000 mg | Freq: Three times a day (TID) | INTRAVENOUS | Status: DC | PRN
  Filled 2019-12-18: qty 5

## 2019-12-18 MED ORDER — ENOXAPARIN SODIUM 40 MG/0.4ML ~~LOC~~ SOLN
40.0000 mg | SUBCUTANEOUS | Status: DC
  Administered 2019-12-19: 40 mg via SUBCUTANEOUS
  Filled 2019-12-18: qty 0.4

## 2019-12-18 MED ORDER — ROCURONIUM BROMIDE 10 MG/ML (PF) SYRINGE
PREFILLED_SYRINGE | INTRAVENOUS | Status: DC | PRN
  Administered 2019-12-18: 50 mg via INTRAVENOUS
  Administered 2019-12-18: 30 mg via INTRAVENOUS

## 2019-12-18 MED ORDER — FENTANYL CITRATE (PF) 250 MCG/5ML IJ SOLN
INTRAMUSCULAR | Status: AC
  Filled 2019-12-18: qty 5

## 2019-12-18 MED ORDER — LIDOCAINE 2% (20 MG/ML) 5 ML SYRINGE
INTRAMUSCULAR | Status: DC | PRN
  Administered 2019-12-18: 100 mg via INTRAVENOUS

## 2019-12-18 MED ORDER — PHENYLEPHRINE 40 MCG/ML (10ML) SYRINGE FOR IV PUSH (FOR BLOOD PRESSURE SUPPORT)
PREFILLED_SYRINGE | INTRAVENOUS | Status: DC | PRN
  Administered 2019-12-18 (×2): 80 ug via INTRAVENOUS

## 2019-12-18 MED ORDER — SUCCINYLCHOLINE CHLORIDE 200 MG/10ML IV SOSY
PREFILLED_SYRINGE | INTRAVENOUS | Status: DC | PRN
  Administered 2019-12-18: 120 mg via INTRAVENOUS

## 2019-12-18 MED ORDER — BUPIVACAINE HCL (PF) 0.25 % IJ SOLN
INTRAMUSCULAR | Status: DC | PRN
  Administered 2019-12-18: 25 mL
  Administered 2019-12-18: 5 mL

## 2019-12-18 MED ORDER — AMIODARONE HCL IN DEXTROSE 360-4.14 MG/200ML-% IV SOLN
30.0000 mg/h | INTRAVENOUS | Status: AC
  Administered 2019-12-18 – 2019-12-21 (×7): 30 mg/h via INTRAVENOUS
  Filled 2019-12-18 (×10): qty 200

## 2019-12-18 MED ORDER — AMIODARONE HCL IN DEXTROSE 360-4.14 MG/200ML-% IV SOLN
60.0000 mg/h | INTRAVENOUS | Status: AC
  Administered 2019-12-18 (×3): 60 mg/h via INTRAVENOUS
  Filled 2019-12-18: qty 200

## 2019-12-18 MED ORDER — PROPOFOL 10 MG/ML IV BOLUS
INTRAVENOUS | Status: AC
  Filled 2019-12-18: qty 20

## 2019-12-18 MED ORDER — SODIUM CHLORIDE 0.9 % IV SOLN
INTRAVENOUS | Status: DC | PRN
  Administered 2019-12-18: 500 mL

## 2019-12-18 MED ORDER — ONDANSETRON HCL 4 MG/2ML IJ SOLN
INTRAMUSCULAR | Status: DC | PRN
  Administered 2019-12-18: 4 mg via INTRAVENOUS

## 2019-12-18 MED ORDER — CHLORHEXIDINE GLUCONATE CLOTH 2 % EX PADS
6.0000 | MEDICATED_PAD | Freq: Every day | CUTANEOUS | Status: DC
  Administered 2019-12-19 – 2019-12-22 (×4): 6 via TOPICAL

## 2019-12-18 MED ORDER — CEFAZOLIN SODIUM-DEXTROSE 2-3 GM-%(50ML) IV SOLR
INTRAVENOUS | Status: DC | PRN
  Administered 2019-12-18: 2 g via INTRAVENOUS

## 2019-12-18 MED ORDER — PHENOL 1.4 % MT LIQD
1.0000 | OROMUCOSAL | Status: DC | PRN
  Administered 2019-12-18: 1 via OROMUCOSAL
  Filled 2019-12-18: qty 177

## 2019-12-18 MED ORDER — ONDANSETRON HCL 4 MG/2ML IJ SOLN: 4.0000 mg | Freq: Once | INTRAMUSCULAR | Status: DC | PRN

## 2019-12-18 MED ORDER — SUGAMMADEX SODIUM 200 MG/2ML IV SOLN
INTRAVENOUS | Status: DC | PRN
  Administered 2019-12-18: 400 mg via INTRAVENOUS

## 2019-12-18 MED ORDER — FENTANYL CITRATE (PF) 250 MCG/5ML IJ SOLN
INTRAMUSCULAR | Status: DC | PRN
  Administered 2019-12-18 (×3): 50 ug via INTRAVENOUS
  Administered 2019-12-18: 150 ug via INTRAVENOUS
  Administered 2019-12-18: 50 ug via INTRAVENOUS

## 2019-12-18 MED ORDER — PHENYLEPHRINE HCL-NACL 10-0.9 MG/250ML-% IV SOLN
INTRAVENOUS | Status: DC | PRN
  Administered 2019-12-18: 30 ug/min via INTRAVENOUS

## 2019-12-18 MED ORDER — LACTATED RINGERS IV SOLN
INTRAVENOUS | Status: DC | PRN
Start: 2019-12-18 — End: 2019-12-18

## 2019-12-18 MED ORDER — LACTATED RINGERS IV SOLN: INTRAVENOUS | Status: DC

## 2019-12-18 MED ORDER — FENTANYL CITRATE (PF) 100 MCG/2ML IJ SOLN: 12.5000 ug | INTRAMUSCULAR | Status: DC | PRN

## 2019-12-18 MED ORDER — ALBUMIN HUMAN 5 % IV SOLN
INTRAVENOUS | Status: DC | PRN
Start: 2019-12-18 — End: 2019-12-18

## 2019-12-18 MED ORDER — 0.9 % SODIUM CHLORIDE (POUR BTL) OPTIME
TOPICAL | Status: DC | PRN
  Administered 2019-12-18: 1000 mL

## 2019-12-18 MED ORDER — MEPERIDINE HCL 25 MG/ML IJ SOLN: 6.2500 mg | INTRAMUSCULAR | Status: DC | PRN

## 2019-12-18 MED ORDER — BUPIVACAINE LIPOSOME 1.3 % IJ SUSP
20.0000 mL | Freq: Once | INTRAMUSCULAR | Status: AC
  Administered 2019-12-18: 20 mL
  Filled 2019-12-18: qty 20

## 2019-12-18 MED ORDER — AMIODARONE LOAD VIA INFUSION
150.0000 mg | Freq: Once | INTRAVENOUS | Status: AC
  Administered 2019-12-18: 150 mg via INTRAVENOUS
  Filled 2019-12-18: qty 83.34

## 2019-12-18 SURGICAL SUPPLY — 56 items
APPLIER CLIP 5 13 M/L LIGAMAX5 (MISCELLANEOUS)
APPLIER CLIP ROT 10 11.4 M/L (STAPLE)
BENZOIN TINCTURE PRP APPL 2/3 (GAUZE/BANDAGES/DRESSINGS) IMPLANT
BLADE 10 SAFETY STRL DISP (BLADE) ×3 IMPLANT
BLADE CLIPPER SURG (BLADE) IMPLANT
CANISTER SUCT 3000ML PPV (MISCELLANEOUS) IMPLANT
CHLORAPREP W/TINT 26 (MISCELLANEOUS) ×3 IMPLANT
CLIP APPLIE 5 13 M/L LIGAMAX5 (MISCELLANEOUS) IMPLANT
CLIP APPLIE ROT 10 11.4 M/L (STAPLE) IMPLANT
COVER SURGICAL LIGHT HANDLE (MISCELLANEOUS) IMPLANT
COVER WAND RF STERILE (DRAPES) IMPLANT
DERMABOND ADVANCED (GAUZE/BANDAGES/DRESSINGS) ×8
DERMABOND ADVANCED .7 DNX12 (GAUZE/BANDAGES/DRESSINGS) ×4 IMPLANT
DEVICE SECURE STRAP 25 ABSORB (INSTRUMENTS) ×3 IMPLANT
DRAIN PENROSE 1/2X12 LTX STRL (WOUND CARE) ×3 IMPLANT
DRSG TEGADERM 4X4.75 (GAUZE/BANDAGES/DRESSINGS) IMPLANT
ELECT CAUTERY BLADE 6.4 (BLADE) ×3 IMPLANT
ELECT REM PT RETURN 9FT ADLT (ELECTROSURGICAL) ×3
ELECTRODE REM PT RTRN 9FT ADLT (ELECTROSURGICAL) ×1 IMPLANT
GAUZE SPONGE 2X2 8PLY STRL LF (GAUZE/BANDAGES/DRESSINGS) IMPLANT
GLOVE BIOGEL M STRL SZ7.5 (GLOVE) ×3 IMPLANT
GLOVE INDICATOR 8.0 STRL GRN (GLOVE) ×3 IMPLANT
GOWN STRL REUS W/ TWL LRG LVL3 (GOWN DISPOSABLE) ×2 IMPLANT
GOWN STRL REUS W/TWL 2XL LVL3 (GOWN DISPOSABLE) ×6 IMPLANT
GOWN STRL REUS W/TWL LRG LVL3 (GOWN DISPOSABLE) ×4
GRASPER SUT TROCAR 14GX15 (MISCELLANEOUS) ×3 IMPLANT
KIT BASIN OR (CUSTOM PROCEDURE TRAY) ×3 IMPLANT
KIT TURNOVER KIT B (KITS) ×3 IMPLANT
MESH VENTRALIGHT ST 4X6IN (Mesh General) ×3 IMPLANT
NS IRRIG 1000ML POUR BTL (IV SOLUTION) ×3 IMPLANT
PAD ARMBOARD 7.5X6 YLW CONV (MISCELLANEOUS) ×6 IMPLANT
PENCIL SMOKE EVACUATOR (MISCELLANEOUS) ×3 IMPLANT
SCISSORS LAP 5X35 DISP (ENDOMECHANICALS) ×3 IMPLANT
SET IRRIG TUBING LAPAROSCOPIC (IRRIGATION / IRRIGATOR) IMPLANT
SET TUBE SMOKE EVAC HIGH FLOW (TUBING) ×3 IMPLANT
SLEEVE ENDOPATH XCEL 5M (ENDOMECHANICALS) ×3 IMPLANT
SPONGE GAUZE 2X2 STER 10/PKG (GAUZE/BANDAGES/DRESSINGS)
SPONGE LAP 18X18 RF (DISPOSABLE) ×3 IMPLANT
STAPLER VISISTAT 35W (STAPLE) ×3 IMPLANT
SUT MNCRL AB 4-0 PS2 18 (SUTURE) ×6 IMPLANT
SUT SILK 3 0 SH 30 (SUTURE) ×6 IMPLANT
SUT SILK 3 0 SH CR/8 (SUTURE) ×3 IMPLANT
SUT VIC AB 2-0 SH 27 (SUTURE) ×4
SUT VIC AB 2-0 SH 27X BRD (SUTURE) ×2 IMPLANT
SUT VIC AB 3-0 SH 18 (SUTURE) ×3 IMPLANT
SUT VICRYL 0 UR6 27IN ABS (SUTURE) ×3 IMPLANT
TOWEL GREEN STERILE (TOWEL DISPOSABLE) ×3 IMPLANT
TOWEL GREEN STERILE FF (TOWEL DISPOSABLE) ×3 IMPLANT
TRAY LAPAROSCOPIC MC (CUSTOM PROCEDURE TRAY) ×3 IMPLANT
TROCAR XCEL BLADELESS 5X75MML (TROCAR) ×9 IMPLANT
TROCAR XCEL BLUNT TIP 100MML (ENDOMECHANICALS) ×6 IMPLANT
TROCAR XCEL NON-BLD 5MMX100MML (ENDOMECHANICALS) ×3 IMPLANT
TUBING CONNECTING 10 (TUBING) ×4 IMPLANT
TUBING CONNECTING 10' (TUBING) ×2
WATER STERILE IRR 1000ML POUR (IV SOLUTION) ×3 IMPLANT
YANKAUER SUCT BULB TIP NO VENT (SUCTIONS) ×6 IMPLANT

## 2019-12-18 NOTE — TOC Initial Note (Signed)
Transition of Care [facility]) - Initial/Assessment Note    Patient Details  Name: [name] [name] MRN: [id number] Date of Birth: 1941/06/14  Transition of Care [facility]) CM/SW Contact:    Durenda Guthrie, RN Phone Number: 12/18/2019, 3:10 PM  Clinical Narrative:     Patient is 79 yr old male admitted with left abdominal pain. Had  left inguinal hernia repair on 12/02/19.          He has partial SBO Plan for LAP Repair by Dr Andrey Campanile today. Cardiology consulted for atrial flutter and pre op clearance. Patient is on Eliquis. Lives home with wife. TOC Team will continue to monitor.        Patient Goals and CMS Choice        Expected Discharge Plan and Services                                                Prior Living Arrangements/Services                       Activities of Daily Living Home Assistive Devices/Equipment: Eyeglasses, Gilmer Mor (specify quad or straight), Walker (specify type) ADL Screening (condition at time of admission) Patient's cognitive ability adequate to safely complete daily activities?: Yes Is the patient deaf or have difficulty hearing?: Yes Does the patient have difficulty seeing, even when wearing glasses/contacts?: No Does the patient have difficulty concentrating, remembering, or making decisions?: No Patient able to express need for assistance with ADLs?: Yes Does the patient have difficulty dressing or bathing?: No Independently performs ADLs?: Yes (appropriate for developmental age) Does the patient have difficulty walking or climbing stairs?: Yes Weakness of Legs: Both Weakness of Arms/Hands: None  Permission Sought/Granted                  Emotional Assessment              Admission diagnosis:  Rales [R09.89] AKI (acute kidney injury) (HCC) [N17.9] Inguinal hernia of left side with obstruction [K40.30] Patient Active Problem List   Diagnosis Date Noted  . Inguinal hernia of left side with obstruction 12/17/2019   . Hypotension 12/17/2019  . AKI (acute kidney injury) (HCC) 12/17/2019  . Incarcerated left inguinal hernia 12/01/2019  . Pressure injury of skin 10/06/2019  . Acute on chronic respiratory failure with hypoxia (HCC) 10/03/2019  . Left inguinal hernia 10/03/2019  . Multiple fractures of ribs, right side, init for clos fx 09/04/2019  . Chest pain 01/28/2015  . Near syncope 01/28/2015  . Chronic diastolic heart failure (HCC) 01/28/2015  . HCAP (healthcare-associated pneumonia) 10/11/2013  . Acute respiratory failure with hypoxia (HCC) 10/07/2013  . Shock circulatory (HCC) 10/07/2013  . UTI (urinary tract infection) 10/06/2013  . Recurrent UTI 10/06/2013  . Urinary retention 10/06/2013  . Insomnia 09/04/2013  . Bradycardia 08/23/2013  . Acute on chronic diastolic heart failure (HCC) 08/22/2013  . Atrial fibrillation (HCC) 05/21/2013  . Atrial flutter (HCC)   . Acute on chronic systolic heart failure (HCC) 05/11/2013  . Aortic atherosclerosis (HCC) 05/05/2013  . Aortic dissection (HCC) 05/04/2013  . Hypochromic anemia 05/04/2013  . CAD (coronary artery disease)   . S/P AVR (aortic valve replacement)   . Hyperlipidemia   . HTN (hypertension)    PCP:  Street, [name], MD Pharmacy:   [facility] [name]  [place] - [address] [facility] [place] [address] Phone: [phone] Fax: [phone]     Social Determinants of Health (SDOH) Interventions    Readmission Risk Interventions No flowsheet data found.

## 2019-12-18 NOTE — Progress Notes (Signed)
PROGRESS NOTE    [name] [name]  BDZ:[id number] DOB: 03-Mar-1941 DOA: 12/16/2019 PCP: Casper Harrison, [name], MD    Chief Complaint  Patient presents with  . Abdominal Pain    Brief Narrative:  79 year old male with prior h/o PAF on eliquis, CAD, chronic systolic HF, hypertension, hyperlipidemia, h/o AV replacement, AA s/p repair, incarcerated left inguinal hernia s/p repair on 12/02/2019, presents to ED with abd pain.  He was found to have partial colonic obstruction. General surgery consulted.  Plan for LAP  Repair by Dr Andrey Campanile today. Cardiology consulted for atrial flutter and pre op clearance.   Assessment & Plan:   Principal Problem:   Inguinal hernia of left side with obstruction Active Problems:   Atrial fibrillation (HCC)   Chronic diastolic heart failure (HCC)   Hypotension   AKI (acute kidney injury) (HCC)   Recurrent left inguinal hernia with partial colonic obstruction; - general surgery consulted and plan for lap repair today.  - cardiology on board for pre op clearance.  - pain control and NPO.  - currently has NG TUBE on intermittent low suction.     Persistent atrial flutter/ fibrillation:  Started on IV Amiodarone for rate control.    Chronic diastolic heart failure Pt appears dehydrated and he is IV fluids NS at 50 ml/hr.  Last echocardiogram showed LVEF of 25 to 30%.    AKI  probably from dehydration and poor oral intake.  Creatinine improved from 1.7 to 1.5.  Continue with IV fluids and recheck creatinine in am.  Avoid nephrotoxins at this time.  Baseline creatinine around 1.1   DVT prophylaxis:scd's Code Status: FULL CODE.  Family Communication: none at bedside.  Disposition:   Status is: Inpatient  Remains inpatient appropriate because:IV treatments appropriate due to intensity of illness or inability to take PO   Dispo: The patient is from: Home              Anticipated d/c is to: pending.               Anticipated d/c date is: >  3 days              Patient currently is not medically stable to d/c.       Consultants:   Cardiology  Surgery.    Procedures: lap repair of recurrent inguinal hernia with mesh scheduled for today.   Antimicrobials: Anti-infectives (From admission, onward)   None    .  Subjective: Reports persistent abd pain, nausea, feels like his throat is scratchy. No chest pain or sob.   Objective: Vitals:   12/18/19 0247 12/18/19 0411 12/18/19 0718 12/18/19 0840  BP: 126/86 110/75  108/70  Pulse: 99 (!) 109 70 (!) 102  Resp: (!) 26 (!) 25  (!) 21  Temp: 98 F (36.7 C) 98.3 F (36.8 C)  98.3 F (36.8 C)  TempSrc: Oral Oral  Oral  SpO2: 100% 95% 95% 98%  Weight: 78.2 kg 78.2 kg    Height:        Intake/Output Summary (Last 24 hours) at 12/18/2019 1124 Last data filed at 12/18/2019 1100 Gross per 24 hour  Intake 600.24 ml  Output 100 ml  Net 500.24 ml   Filed Weights   12/17/19 1611 12/18/19 0247 12/18/19 0411  Weight: 79.2 kg 78.2 kg 78.2 kg    Examination:  General exam: Appears calm and comfortable  Respiratory system: diminished at bases,  Cardiovascular system: S1 & S2 heard, Irregularly irregular, no  JVD, No pedal edema. Gastrointestinal system: Abdomen is firm, tender, distended, bowel sounds minimal.  Central nervous system: Alert and oriented to place and person.  Extremities: Symmetric 5 x 5 power. Skin: No rashes, lesions or ulcers Psychiatry: anxious.      Data Reviewed: I have personally reviewed following labs and imaging studies  CBC: Recent Labs  Lab 12/16/19 1841 12/18/19 0404  WBC 9.9 8.5  HGB 11.8* 9.9*  HCT 39.1 33.7*  MCV 100.5* 102.7*  PLT 369 262    Basic Metabolic Panel: Recent Labs  Lab 12/16/19 1841 12/17/19 0429 12/18/19 0404  NA 144 145 148*  K 4.4 4.6 4.0  CL 108 113* 117*  CO2 23 24 19*  GLUCOSE 114* 90 76  BUN 41* 38* 33*  CREATININE 1.72* 1.52* 1.56*  CALCIUM 9.4 8.6* 8.7*  MG  --  2.4  --      GFR: Estimated Creatinine Clearance: 39.6 mL/min (A) (by C-G formula based on SCr of 1.56 mg/dL (H)).  Liver Function Tests: Recent Labs  Lab 12/16/19 1841  AST 34  ALT 24  ALKPHOS 56  BILITOT 0.7  PROT 7.5  ALBUMIN 4.0    CBG: No results for input(s): GLUCAP in the last 168 hours.   Recent Results (from the past 240 hour(s))  SARS Coronavirus 2 by RT PCR (hospital order, performed in [facility] hospital lab) Nasopharyngeal Nasopharyngeal Swab     Status: None   Collection Time: 12/17/19  3:39 AM   Specimen: Nasopharyngeal Swab  Result Value Ref Range Status   SARS Coronavirus 2 NEGATIVE NEGATIVE Final    Comment: (NOTE) SARS-CoV-2 target nucleic acids are NOT DETECTED.  The SARS-CoV-2 RNA is generally detectable in upper and lower respiratory specimens during the acute phase of infection. The lowest concentration of SARS-CoV-2 viral copies this assay can detect is 250 copies / mL. A negative result does not preclude SARS-CoV-2 infection and should not be used as the sole basis for treatment or other patient management decisions.  A negative result may occur with improper specimen collection / handling, submission of specimen other than nasopharyngeal swab, presence of viral mutation(s) within the areas targeted by this assay, and inadequate number of viral copies (<250 copies / mL). A negative result must be combined with clinical observations, patient history, and epidemiological information.  Fact Sheet for Patients:   BoilerBrush.com.cy  Fact Sheet for Healthcare Providers: https://pope.com/  This test is not yet approved or  cleared by the [place] FDA and has been authorized for detection and/or diagnosis of SARS-CoV-2 by FDA under an Emergency Use Authorization (EUA).  This EUA will remain in effect (meaning this test can be used) for the duration of the COVID-19 declaration under Section 564(b)(1) of  the Act, 21 U.S.C. section 360bbb-3(b)(1), unless the authorization is terminated or revoked sooner.  Performed at [facility], [address], [place], [place] [address]          Radiology Studies: CT ABDOMEN PELVIS W CONTRAST  Result Date: 12/17/2019 CLINICAL DATA:  Abdominal distension. Diarrhea. Vomiting. Recent hernia surgery. EXAM: CT ABDOMEN AND PELVIS WITH CONTRAST TECHNIQUE: Multidetector CT imaging of the abdomen and pelvis was performed using the standard protocol following bolus administration of intravenous contrast. CONTRAST:  OMNIPAQUE IOHEXOL 300 MG/ML  SOLN COMPARISON:  Most recent CT 09/04/2019 FINDINGS: Lower chest: Descending thoracic aortic stent graft is partially included. Stable dimensions of the distal thoracic aorta. Multi chamber cardiomegaly. Mitral annulus calcifications. Coronary artery calcifications.  No pleural effusion or acute basilar airspace disease. Hepatobiliary: No focal hepatic lesion. Layering sludge and stones in the gallbladder without pericholecystic inflammation or biliary dilatation. Pancreas: Parenchymal atrophy. No ductal dilatation or inflammation. Spleen: Normal in size without focal abnormality. Adrenals/Urinary Tract: Normal adrenal glands. No hydronephrosis or perinephric edema. Homogeneous renal enhancement with symmetric excretion on delayed phase imaging. There are small bilateral renal cysts. Urinary bladder is physiologically distended without wall thickening. Mass effect on the bladder base from enlarged prostate gland. Stomach/Bowel: Recurrent left inguinal hernia which currently contains short segment of sigmoid colon. Colon proximal to the hernia is diffusely and prominently dilated and fluid-filled with air-fluid levels. Minimal stranding in the hernia sac with small amount of free fluid. There is no definite colonic wall thickening. More distal sigmoid colon is less distended with formed stool. Colonic diverticulosis without  focal diverticulitis. The cecum is located in the right mid abdomen, containing fluid and dependent density, possibly ingested contents or contrast. There is no significant small bowel dilatation. There is no pneumatosis. Enteric contrast within the distal esophagus with moderate hiatal hernia. Stomach is not abnormally distended. Vascular/Lymphatic: Aortic atherosclerosis. 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 Moderate bi-iliac atherosclerosis. Patent portal vein. Patent mesenteric vessels. No portal venous gas. No bulky abdominopelvic adenopathy. Reproductive: Enlarged prostate gland spans 5 cm AP with nodular mass effect on the bladder base. Other: Left inguinal hernia, apparently recurrent given history of recent hernia repair. Hernia contains short segment of sigmoid colon. There is fluid in the dependent aspect of the hernia sac. Mild mesenteric and soft tissue stranding in the hernia. No evidence of free air. Small fat containing right inguinal hernia. Small fat containing umbilical hernia. Musculoskeletal: Scoliosis and multilevel degenerative change in the spine. There are no acute or suspicious osseous abnormalities. IMPRESSION: 1. Recurrent left inguinal hernia which currently contains short segment of sigmoid colon. Colon proximal to the hernia is diffusely dilated and fluid-filled with air-fluid levels, suspicious for partial colonic obstruction. Minimal stranding in the hernia sac with small amount of free fluid. No definite colonic wall thickening or evidence of ischemia. 2. Floppy cecum currently located in the right mid abdomen. 3. Colonic diverticulosis without focal diverticulitis. 4. Enlarged prostate gland with nodular mass effect on the bladder base. 5. Moderate hiatal hernia. Enteric contrast within the distal esophagus, can be seen with vomiting, reflux or delayed transit. 6. Gallstones without pericholecystic inflammation. Aortic Atherosclerosis (ICD10-I70.0). Electronically  Signed   By: Narda Rutherford M.D.   On: 12/17/2019 02:10   DG CHEST PORT 1 VIEW  Result Date: 12/17/2019 CLINICAL DATA:  Rales EXAM: PORTABLE CHEST 1 VIEW COMPARISON:  Fifteen days ago FINDINGS: Cardiomegaly. Tortuous aorta with stent grafting. Peripheral density at the left base which is likely fat based on CT earlier today. No edema, consolidation, or pneumothorax. IMPRESSION: No active disease.  No pulmonary edema. Electronically Signed   By: Marnee Spring M.D.   On: 12/17/2019 04:36   DG Abd Portable 1 View  Result Date: 12/17/2019 CLINICAL DATA:  Post NG tube placement EXAM: PORTABLE ABDOMEN - 1 VIEW COMPARISON:  Chest x-ray 12/17/2019 FINDINGS: Status post endovascular repair of the thoracic aorta and median sternotomy incidentally noted. Gastric tube in the stomach, tip in the fundus. Side port below GE junction. Distended loops of bowel are partially visualized in the upper abdomen. Abdominal plain film excludes portions of the LEFT flank and pelvis. Visualized skeletal structures without acute process. IMPRESSION: 1. Gastric tube in the stomach. 2.  Distended loops of bowel are partially visualized in the upper abdomen. Abdominal plain film excludes portions of the LEFT flank and pelvis. Electronically Signed   By: Donzetta Kohut M.D.   On: 12/17/2019 07:57        Scheduled Meds: . metoprolol tartrate  2.5 mg Intravenous Q6H  . sodium chloride flush  3 mL Intravenous Once   Continuous Infusions: . sodium chloride 50 mL/hr at 12/18/19 1113  . amiodarone 60 mg/hr (12/18/19 1107)   Followed by  . amiodarone       LOS: 1 day        Kathlen Mody, MD [facility]   To contact the attending provider between 7A-7P or the covering provider during after hours 7P-7A, please log into the web site [email or web address] and access using universal [place] password for that web site. If you do not have the password, please call the hospital operator.  12/18/2019, 11:25 AM

## 2019-12-18 NOTE — Anesthesia Preprocedure Evaluation (Signed)
Anesthesia Evaluation  Patient identified by MRN, date of birth, ID band Patient awake    Reviewed: Allergy & Precautions, NPO status , Patient's Chart, lab work & pertinent test results  Airway Mallampati: I  TM Distance: >3 FB Neck ROM: Full    Dental   Pulmonary former smoker,    Pulmonary exam normal        Cardiovascular hypertension, Pt. on medications + CAD  Normal cardiovascular exam+ dysrhythmias Atrial Fibrillation      Neuro/Psych    GI/Hepatic   Endo/Other    Renal/GU      Musculoskeletal   Abdominal   Peds  Hematology   Anesthesia Other Findings   Reproductive/Obstetrics                             Anesthesia Physical Anesthesia Plan  ASA: III  Anesthesia Plan: General   Post-op Pain Management:    Induction: Intravenous, Rapid sequence and Cricoid pressure planned  PONV Risk Score and Plan: 2 and Ondansetron and Midazolam  Airway Management Planned: Oral ETT  Additional Equipment:   Intra-op Plan:   Post-operative Plan: Extubation in OR  Informed Consent: I have reviewed the patients History and Physical, chart, labs and discussed the procedure including the risks, benefits and alternatives for the proposed anesthesia with the patient or authorized representative who has indicated his/her understanding and acceptance.       Plan Discussed with: CRNA and Surgeon  Anesthesia Plan Comments:         Anesthesia Quick Evaluation

## 2019-12-18 NOTE — Brief Op Note (Signed)
12/18/2019  8:19 PM  PATIENT:  [name] [name]  79 y.o. male  PRE-OPERATIVE DIAGNOSIS:  recurrent left incarcerated inguinalscrotal hernia  POST-OPERATIVE DIAGNOSIS:  recurrent left incarcerated indirect inguinalscrotal hernia  PROCEDURE:  Procedure(s): LAPAROSCOPIC ASSISTED REPAIR OF RECURRENT LEFT INCARCERATED INDIRECT INGUINAL/SCROTAL HERNIA WITH MESH LAPAROSCOPIC REPAIR OF COLONIC SEROSAL TEAR LAPAROSCOPIC BILATERAL TAP BLOCK  SURGEON:  Surgeon(s) and Role:    Gaynelle Adu, MD -     Violeta Gelinas, MD - Co-surgeon  PHYSICIAN ASSISTANT:   ASSISTANTS: none   ANESTHESIA:   general  EBL:  100 mL   BLOOD ADMINISTERED:none  DRAINS: Nasogastric Tube and Urinary Catheter (Foley)   LOCAL MEDICATIONS USED:  MARCAINE    and OTHER exparel   SPECIMEN:  No Specimen  DISPOSITION OF SPECIMEN:  N/A  COUNTS:  YES  TOURNIQUET:  * No tourniquets in log *  DICTATION: .Other Dictation: Dictation Number [id number]  PLAN OF CARE: Admit to inpatient   PATIENT DISPOSITION:  PACU - hemodynamically stable.   Delay start of Pharmacological VTE agent (>24hrs) due to surgical blood loss or risk of bleeding: no  Mary Sella. Andrey Campanile, MD, FACS General, Bariatric, & Minimally Invasive Surgery [facility], [place]

## 2019-12-18 NOTE — Transfer of Care (Signed)
Immediate Anesthesia Transfer of Care Note  Patient: [name] [name]  Procedure(s) Performed: ATTEMPTED LAPAROSCOPIC INGUINAL HERNIA (Left Abdomen) LEFT HERNIA REPAIR INGUINAL ADULT (Left Groin)  Patient Location: PACU  Anesthesia Type:General  Level of Consciousness: awake, alert  and oriented  Airway & Oxygen Therapy: Patient Spontanous Breathing and Patient connected to face mask oxygen  Post-op Assessment: Report given to RN and Post -op Vital signs reviewed and stable  Post vital signs: Reviewed and stable  Last Vitals:  Vitals Value Taken Time  BP 129/53 12/18/19 2017  Temp    Pulse 122 12/18/19 2021  Resp 24 12/18/19 2021  SpO2 98 % 12/18/19 2021  Vitals shown include unvalidated device data.  Last Pain:  Vitals:   12/18/19 1310  TempSrc: Oral  PainSc: 0-No pain      Patients Stated Pain Goal: 0 (12/18/19 0718)  Complications: No complications documented.

## 2019-12-18 NOTE — Progress Notes (Signed)
Patient ID: ROEN MACGOWAN, male   DOB: 09-09-1940, 79 y.o.   MRN: [id number]  Pt seen & examined Appears a little diaphoretic Soft, mild distension, bulge in groin, incision ok  Recurrent incarcerated inguinal hernia  Bowel obstruction  the patient has elected to proceed with lap repair of recurrent inguinal hernia with mesh, poss open    I described the procedure in detail.  We discussed the risks and benefits including but not limited to bleeding, infection, chronic inguinal pain, nerve entrapment, hernia recurrence, mesh complications, hematoma formation, urinary retention, injury to the testicle, numbness in the groin, blood clots, injury to the surrounding structures, and anesthesia risk, cardiac/pulmonary events. We also discussed the typical post operative recovery course, including no heavy lifting for 4-6 weeks. I explained that the likelihood of improvement of their symptoms is good.   Not really a medical option since hernia causing obstruction and not reducible  Increased risk for injury to testicle blood flow, hematoma/seroma, recurrence, infection, conversion to open, cardiac events  Discussed with pt that these inguinoscrotal hernias can be challenging even laparoscopically due to size of hernia sac.   I reached the daughter and wife via phone and had same conversation with them. Discussed criteria for dc after surgery  Mary Sella. Andrey Campanile, MD, FACS General, Bariatric, & Minimally Invasive Surgery [facility], [place]

## 2019-12-18 NOTE — Progress Notes (Addendum)
Progress Note  Patient Name: [name] [name] Date of Encounter: 12/18/2019  [facility] Cardiologist: Lance Muss, MD   Subjective   Plan for surgery later today.  Inpatient Medications    Scheduled Meds: . metoprolol tartrate  2.5 mg Intravenous Q6H  . sodium chloride flush  3 mL Intravenous Once   Continuous Infusions: . sodium chloride 50 mL/hr at 12/17/19 1627   PRN Meds: metoprolol tartrate, phenol, prochlorperazine   Vital Signs    Vitals:   12/18/19 0211 12/18/19 0247 12/18/19 0411 12/18/19 0840  BP: 120/75 126/86 110/75 108/70  Pulse: (!) 117 99 (!) 109 (!) 102  Resp:  (!) 26 (!) 25 (!) 21  Temp:  98 F (36.7 C) 98.3 F (36.8 C) 98.3 F (36.8 C)  TempSrc:  Oral Oral Oral  SpO2: 96% 100% 95% 98%  Weight:  78.2 kg 78.2 kg   Height:        Intake/Output Summary (Last 24 hours) at 12/18/2019 0952 Last data filed at 12/18/2019 0842 Gross per 24 hour  Intake 600 ml  Output 100 ml  Net 500 ml   Last 3 Weights 12/18/2019 12/18/2019 12/17/2019  Weight (lbs) 172 lb 4.8 oz 172 lb 4.8 oz 174 lb 9.7 oz  Weight (kg) 78.155 kg 78.155 kg 79.2 kg      Telemetry    Atrial flutter with increased HR - Personally Reviewed  ECG      Physical Exam   GEN: resting , NG tube in place Neck: No JVD Cardiac: tachy, no murmurs, rubs, or gallops.  Respiratory: No wheezing to auscultation bilaterally. GI: mildly distended  MS: No edema; No deformity. Neuro:  Nonfocal  Psych: Normal affect - sleepy  Labs    High Sensitivity Troponin:   Recent Labs  Lab 12/16/19 2230 12/17/19 0013 12/17/19 0429  TROPONINIHS 17 20* 22*      Chemistry Recent Labs  Lab 12/16/19 1841 12/17/19 0429 12/18/19 0404  NA 144 145 148*  K 4.4 4.6 4.0  CL 108 113* 117*  CO2 23 24 19*  GLUCOSE 114* 90 76  BUN 41* 38* 33*  CREATININE 1.72* 1.52* 1.56*  CALCIUM 9.4 8.6* 8.7*  PROT 7.5  --   --   ALBUMIN 4.0  --   --   AST 34  --   --   ALT 24  --   --   ALKPHOS 56  --    --   BILITOT 0.7  --   --   GFRNONAA 37* 43* 41*  GFRAA 43* 49* 48*  ANIONGAP 13 8 12      Hematology Recent Labs  Lab 12/16/19 1841 12/18/19 0404  WBC 9.9 8.5  RBC 3.89* 3.28*  HGB 11.8* 9.9*  HCT 39.1 33.7*  MCV 100.5* 102.7*  MCH 30.3 30.2  MCHC 30.2 29.4*  RDW 19.4* 19.3*  PLT 369 262    BNP Recent Labs  Lab 12/17/19 0429  BNP 587.5*     DDimer No results for input(s): DDIMER in the last 168 hours.   Radiology    CT ABDOMEN PELVIS W CONTRAST  Result Date: 12/17/2019 CLINICAL DATA:  Abdominal distension. Diarrhea. Vomiting. Recent hernia surgery. EXAM: CT ABDOMEN AND PELVIS WITH CONTRAST TECHNIQUE: Multidetector CT imaging of the abdomen and pelvis was performed using the standard protocol following bolus administration of intravenous contrast. CONTRAST:  12/19/2019 OMNIPAQUE IOHEXOL 300 MG/ML  SOLN COMPARISON:  Most recent CT 09/04/2019 FINDINGS: Lower chest: Descending thoracic aortic stent graft is partially  included. Stable dimensions of the distal thoracic aorta. Multi chamber cardiomegaly. Mitral annulus calcifications. Coronary artery calcifications. No pleural effusion or acute basilar airspace disease. Hepatobiliary: No focal hepatic lesion. Layering sludge and stones in the gallbladder without pericholecystic inflammation or biliary dilatation. Pancreas: Parenchymal atrophy. No ductal dilatation or inflammation. Spleen: Normal in size without focal abnormality. Adrenals/Urinary Tract: Normal adrenal glands. No hydronephrosis or perinephric edema. Homogeneous renal enhancement with symmetric excretion on delayed phase imaging. There are small bilateral renal cysts. Urinary bladder is physiologically distended without wall thickening. Mass effect on the bladder base from enlarged prostate gland. Stomach/Bowel: Recurrent left inguinal hernia which currently contains short segment of sigmoid colon. Colon proximal to the hernia is diffusely and prominently dilated and  fluid-filled with air-fluid levels. Minimal stranding in the hernia sac with small amount of free fluid. There is no definite colonic wall thickening. More distal sigmoid colon is less distended with formed stool. Colonic diverticulosis without focal diverticulitis. The cecum is located in the right mid abdomen, containing fluid and dependent density, possibly ingested contents or contrast. There is no significant small bowel dilatation. There is no pneumatosis. Enteric contrast within the distal esophagus with moderate hiatal hernia. Stomach is not abnormally distended. Vascular/Lymphatic: Aortic atherosclerosis. 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 Moderate bi-iliac atherosclerosis. Patent portal vein. Patent mesenteric vessels. No portal venous gas. No bulky abdominopelvic adenopathy. Reproductive: Enlarged prostate gland spans 5 cm AP with nodular mass effect on the bladder base. Other: Left inguinal hernia, apparently recurrent given history of recent hernia repair. Hernia contains short segment of sigmoid colon. There is fluid in the dependent aspect of the hernia sac. Mild mesenteric and soft tissue stranding in the hernia. No evidence of free air. Small fat containing right inguinal hernia. Small fat containing umbilical hernia. Musculoskeletal: Scoliosis and multilevel degenerative change in the spine. There are no acute or suspicious osseous abnormalities. IMPRESSION: 1. Recurrent left inguinal hernia which currently contains short segment of sigmoid colon. Colon proximal to the hernia is diffusely dilated and fluid-filled with air-fluid levels, suspicious for partial colonic obstruction. Minimal stranding in the hernia sac with small amount of free fluid. No definite colonic wall thickening or evidence of ischemia. 2. Floppy cecum currently located in the right mid abdomen. 3. Colonic diverticulosis without focal diverticulitis. 4. Enlarged prostate gland with nodular mass effect on the  bladder base. 5. Moderate hiatal hernia. Enteric contrast within the distal esophagus, can be seen with vomiting, reflux or delayed transit. 6. Gallstones without pericholecystic inflammation. Aortic Atherosclerosis (ICD10-I70.0). Electronically Signed   By: Narda Rutherford M.D.   On: 12/17/2019 02:10   DG CHEST PORT 1 VIEW  Result Date: 12/17/2019 CLINICAL DATA:  Rales EXAM: PORTABLE CHEST 1 VIEW COMPARISON:  Fifteen days ago FINDINGS: Cardiomegaly. Tortuous aorta with stent grafting. Peripheral density at the left base which is likely fat based on CT earlier today. No edema, consolidation, or pneumothorax. IMPRESSION: No active disease.  No pulmonary edema. Electronically Signed   By: Marnee Spring M.D.   On: 12/17/2019 04:36   DG Abd Portable 1 View  Result Date: 12/17/2019 CLINICAL DATA:  Post NG tube placement EXAM: PORTABLE ABDOMEN - 1 VIEW COMPARISON:  Chest x-ray 12/17/2019 FINDINGS: Status post endovascular repair of the thoracic aorta and median sternotomy incidentally noted. Gastric tube in the stomach, tip in the fundus. Side port below GE junction. Distended loops of bowel are partially visualized in the upper abdomen. Abdominal plain film excludes portions of the LEFT  flank and pelvis. Visualized skeletal structures without acute process. IMPRESSION: 1. Gastric tube in the stomach. 2. Distended loops of bowel are partially visualized in the upper abdomen. Abdominal plain film excludes portions of the LEFT flank and pelvis. Electronically Signed   By: Donzetta Kohut M.D.   On: 12/17/2019 07:57    Cardiac Studies   EF 25-30%  Patient Profile     79 y.o. male recurrent atrial flutter with need for hernia surgery  Assessment & Plan    Wil restart IV Amio gett to help with rate control- he has been taking PO prior to this GI issue..  Do not want to add rate control meds that may drop BP.    Will follow along.     For questions or updates, please contact [facility] Please  consult [email or web address] for contact info under        Signed, Lance Muss, MD  12/18/2019, 9:52 AM

## 2019-12-18 NOTE — Anesthesia Procedure Notes (Signed)
Procedure Name: Intubation Date/Time: 12/18/2019 5:25 PM Performed by: Griffin Dakin, CRNA Pre-anesthesia Checklist: Patient identified, Emergency Drugs available, Suction available and Patient being monitored Patient Re-evaluated:Patient Re-evaluated prior to induction Oxygen Delivery Method: Circle system utilized Preoxygenation: Pre-oxygenation with 100% oxygen Induction Type: IV induction and Rapid sequence Laryngoscope Size: Mac and 3 Grade View: Grade I Tube type: Oral Tube size: 7.5 mm Number of attempts: 1 Airway Equipment and Method: Stylet Placement Confirmation: ETT inserted through vocal cords under direct vision,  positive ETCO2 and breath sounds checked- equal and bilateral Secured at: 23 cm Tube secured with: Tape Dental Injury: Teeth and Oropharynx as per pre-operative assessment

## 2019-12-18 NOTE — Progress Notes (Signed)
Subjective/Chief Complaint: No abdominal pain Had liquid BMs Throat sore from NGT   Objective: Vital signs in last 24 hours: Temp:  [97.6 F (36.4 C)-98.3 F (36.8 C)] 98.3 F (36.8 C) (07/01 0411) Pulse Rate:  [45-117] 109 (07/01 0411) Resp:  [13-36] 25 (07/01 0411) BP: (105-144)/(43-114) 110/75 (07/01 0411) SpO2:  [90 %-100 %] 95 % (07/01 0411) Weight:  [78.2 kg-79.2 kg] 78.2 kg (07/01 0411) Last BM Date: 12/17/19  Intake/Output from previous day: 06/30 0701 - 07/01 0700 In: 600 [I.V.:600] Out: 100 [Emesis/NG output:100] Intake/Output this shift: No intake/output data recorded.  General appearance: alert and cooperative Resp: few rales Cardio: irregularly irregular rhythm GI: soft, moderate distention, +BS Male genitalia: LIH not reducible, NT  Lab Results:  Recent Labs    12/16/19 1841 12/18/19 0404  WBC 9.9 8.5  HGB 11.8* 9.9*  HCT 39.1 33.7*  PLT 369 262   BMET Recent Labs    12/17/19 0429 12/18/19 0404  NA 145 148*  K 4.6 4.0  CL 113* 117*  CO2 24 19*  GLUCOSE 90 76  BUN 38* 33*  CREATININE 1.52* 1.56*  CALCIUM 8.6* 8.7*   PT/INR No results for input(s): LABPROT, INR in the last 72 hours. ABG No results for input(s): PHART, HCO3 in the last 72 hours.  Invalid input(s): PCO2, PO2  Studies/Results: CT ABDOMEN PELVIS W CONTRAST  Result Date: 12/17/2019 CLINICAL DATA:  Abdominal distension. Diarrhea. Vomiting. Recent hernia surgery. EXAM: CT ABDOMEN AND PELVIS WITH CONTRAST TECHNIQUE: Multidetector CT imaging of the abdomen and pelvis was performed using the standard protocol following bolus administration of intravenous contrast. CONTRAST:  OMNIPAQUE IOHEXOL 300 MG/ML  SOLN COMPARISON:  Most recent CT 09/04/2019 FINDINGS: Lower chest: Descending thoracic aortic stent graft is partially included. Stable dimensions of the distal thoracic aorta. Multi chamber cardiomegaly. Mitral annulus calcifications. Coronary artery calcifications. No  pleural effusion or acute basilar airspace disease. Hepatobiliary: No focal hepatic lesion. Layering sludge and stones in the gallbladder without pericholecystic inflammation or biliary dilatation. Pancreas: Parenchymal atrophy. No ductal dilatation or inflammation. Spleen: Normal in size without focal abnormality. Adrenals/Urinary Tract: Normal adrenal glands. No hydronephrosis or perinephric edema. Homogeneous renal enhancement with symmetric excretion on delayed phase imaging. There are small bilateral renal cysts. Urinary bladder is physiologically distended without wall thickening. Mass effect on the bladder base from enlarged prostate gland. Stomach/Bowel: Recurrent left inguinal hernia which currently contains short segment of sigmoid colon. Colon proximal to the hernia is diffusely and prominently dilated and fluid-filled with air-fluid levels. Minimal stranding in the hernia sac with small amount of free fluid. There is no definite colonic wall thickening. More distal sigmoid colon is less distended with formed stool. Colonic diverticulosis without focal diverticulitis. The cecum is located in the right mid abdomen, containing fluid and dependent density, possibly ingested contents or contrast. There is no significant small bowel dilatation. There is no pneumatosis. Enteric contrast within the distal esophagus with moderate hiatal hernia. Stomach is not abnormally distended. Vascular/Lymphatic: Aortic atherosclerosis. 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 Moderate bi-iliac atherosclerosis. Patent portal vein. Patent mesenteric vessels. No portal venous gas. No bulky abdominopelvic adenopathy. Reproductive: Enlarged prostate gland spans 5 cm AP with nodular mass effect on the bladder base. Other: Left inguinal hernia, apparently recurrent given history of recent hernia repair. Hernia contains short segment of sigmoid colon. There is fluid in the dependent aspect of the hernia sac. Mild  mesenteric and soft tissue stranding in the hernia. No evidence  of free air. Small fat containing right inguinal hernia. Small fat containing umbilical hernia. Musculoskeletal: Scoliosis and multilevel degenerative change in the spine. There are no acute or suspicious osseous abnormalities. IMPRESSION: 1. Recurrent left inguinal hernia which currently contains short segment of sigmoid colon. Colon proximal to the hernia is diffusely dilated and fluid-filled with air-fluid levels, suspicious for partial colonic obstruction. Minimal stranding in the hernia sac with small amount of free fluid. No definite colonic wall thickening or evidence of ischemia. 2. Floppy cecum currently located in the right mid abdomen. 3. Colonic diverticulosis without focal diverticulitis. 4. Enlarged prostate gland with nodular mass effect on the bladder base. 5. Moderate hiatal hernia. Enteric contrast within the distal esophagus, can be seen with vomiting, reflux or delayed transit. 6. Gallstones without pericholecystic inflammation. Aortic Atherosclerosis (ICD10-I70.0). Electronically Signed   By: Narda Rutherford M.D.   On: 12/17/2019 02:10   DG CHEST PORT 1 VIEW  Result Date: 12/17/2019 CLINICAL DATA:  Rales EXAM: PORTABLE CHEST 1 VIEW COMPARISON:  Fifteen days ago FINDINGS: Cardiomegaly. Tortuous aorta with stent grafting. Peripheral density at the left base which is likely fat based on CT earlier today. No edema, consolidation, or pneumothorax. IMPRESSION: No active disease.  No pulmonary edema. Electronically Signed   By: Marnee Spring M.D.   On: 12/17/2019 04:36   DG Abd Portable 1 View  Result Date: 12/17/2019 CLINICAL DATA:  Post NG tube placement EXAM: PORTABLE ABDOMEN - 1 VIEW COMPARISON:  Chest x-ray 12/17/2019 FINDINGS: Status post endovascular repair of the thoracic aorta and median sternotomy incidentally noted. Gastric tube in the stomach, tip in the fundus. Side port below GE junction. Distended loops of bowel  are partially visualized in the upper abdomen. Abdominal plain film excludes portions of the LEFT flank and pelvis. Visualized skeletal structures without acute process. IMPRESSION: 1. Gastric tube in the stomach. 2. Distended loops of bowel are partially visualized in the upper abdomen. Abdominal plain film excludes portions of the LEFT flank and pelvis. Electronically Signed   By: Donzetta Kohut M.D.   On: 12/17/2019 07:57    Anti-infectives: Anti-infectives (From admission, onward)   None      Assessment/Plan: Recurrent LIH with colonic obstruction - to OR later today for laparoscopic repair recurrent LIH with mesh by Dr. Andrey Campanile. I discussed the procedure, risks and benefits with him and his wife yesterday. I also spoke with his daughter. He agrees. Dr. Andrey Campanile will see pre-op. Continue NGT. Add chloraseptic spray PRN.  Appreciate Cardiology consult and TRH management of medical issues  LOS: 1 day    Liz Malady 12/18/2019

## 2019-12-18 NOTE — Anesthesia Postprocedure Evaluation (Signed)
Anesthesia Post Note  Patient: [name]  Procedure(s) Performed: LAPAROSCOPIC ASSISTED REPAIR OF RECURRENT INCARCERATED LEFT INGUINAL HERNIA WITH MESH; LAPAOSCOPIC REPAIR OF SEROSAL TEAR (Left Inguinal)     Patient location during evaluation: PACU Anesthesia Type: General Level of consciousness: awake and alert, awake and oriented Pain management: pain level controlled Vital Signs Assessment: post-procedure vital signs reviewed and stable Respiratory status: spontaneous breathing, nonlabored ventilation and respiratory function stable Cardiovascular status: blood pressure returned to baseline and stable (A-fib) Postop Assessment: no apparent nausea or vomiting Anesthetic complications: no   No complications documented.  Last Vitals:  Vitals:   12/18/19 2045 12/18/19 2127  BP: 140/68 114/67  Pulse: (!) 104   Resp: 18 18  Temp: 36.7 C 36.6 C  SpO2: 95%     Last Pain:  Vitals:   12/18/19 2127  TempSrc: Oral  PainSc:                  Cecile Hearing

## 2019-12-18 NOTE — Progress Notes (Signed)
HR stable at 90-100s. Continue IV metoprolol 2.5mg  q 6 hours. Additional dose witting for HR > 120s. May start amiodarone for sustained tachycardia. See consult note for details. Will continue to follow post operatively.

## 2019-12-18 NOTE — Progress Notes (Signed)
   12/18/19 0011  Assess: MEWS Score  Temp 98.1 F (36.7 C)  BP (!) 144/83  Pulse Rate (!) 115  ECG Heart Rate (!) 115  Resp (!) 24  SpO2 97 %  O2 Device Room Air  Assess: MEWS Score  MEWS Temp 0  MEWS Systolic 0  MEWS Pulse 2  MEWS RR 1  MEWS LOC 0  MEWS Score 3  MEWS Score Color Yellow  Assess: if the MEWS score is Yellow or Red  Were vital signs taken at a resting state? Yes  Focused Assessment Documented focused assessment  Early Detection of Sepsis Score *See Row Information* Low  MEWS guidelines implemented *See Row Information* Yes  Treat  MEWS Interventions Administered scheduled meds/treatments (see MAR for IVP Metoprolol given )  Take Vital Signs  Increase Vital Sign Frequency  Yellow: Q 2hr X 2 then Q 4hr X 2, if remains yellow, continue Q 4hrs  Escalate  MEWS: Escalate Yellow: discuss with charge nurse/RN and consider discussing with provider and RRT  Notify: Charge Nurse/RN  Name of Charge Nurse/RN Notified D. Buzzy Han, RN   Date Charge Nurse/RN Notified 12/18/19  Time Charge Nurse/RN Notified 0121  Document  Patient Outcome Other (Comment) (heart rate decreased post metoprolol dose, see vitals 0111)

## 2019-12-19 ENCOUNTER — Encounter (HOSPITAL_COMMUNITY): Payer: Self-pay | Admitting: General Surgery

## 2019-12-19 LAB — BASIC METABOLIC PANEL
Anion gap: 11 (ref 5–15)
BUN: 24 mg/dL — ABNORMAL HIGH (ref 8–23)
CO2: 21 mmol/L — ABNORMAL LOW (ref 22–32)
Calcium: 8.4 mg/dL — ABNORMAL LOW (ref 8.9–10.3)
Chloride: 118 mmol/L — ABNORMAL HIGH (ref 98–111)
Creatinine, Ser: 1.37 mg/dL — ABNORMAL HIGH (ref 0.61–1.24)
GFR calc Af Amer: 56 mL/min — ABNORMAL LOW (ref >60)
GFR calc non Af Amer: 48 mL/min — ABNORMAL LOW (ref >60)
Glucose, Bld: 97 mg/dL (ref 70–99)
Potassium: 3.9 mmol/L (ref 3.5–5.1)
Sodium: 150 mmol/L — ABNORMAL HIGH (ref 135–145)

## 2019-12-19 LAB — CBC
HCT: 33 % — ABNORMAL LOW (ref 39.0–52.0)
Hemoglobin: 9.9 g/dL — ABNORMAL LOW (ref 13.0–17.0)
MCH: 31.3 pg (ref 26.0–34.0)
MCHC: 30 g/dL (ref 30.0–36.0)
MCV: 104.4 fL — ABNORMAL HIGH (ref 80.0–100.0)
Platelets: 234 10*3/uL (ref 150–400)
RBC: 3.16 MIL/uL — ABNORMAL LOW (ref 4.22–5.81)
RDW: 19.4 % — ABNORMAL HIGH (ref 11.5–15.5)
WBC: 7.9 10*3/uL (ref 4.0–10.5)
nRBC: 0 % (ref 0.0–0.2)

## 2019-12-19 MED ORDER — DEXTROSE 5 % IV SOLN
INTRAVENOUS | Status: DC
  Administered 2019-12-20: 50 mL/h via INTRAVENOUS

## 2019-12-19 MED ORDER — METOPROLOL TARTRATE 5 MG/5ML IV SOLN
2.5000 mg | Freq: Three times a day (TID) | INTRAVENOUS | Status: DC
  Administered 2019-12-20: 2.5 mg via INTRAVENOUS
  Filled 2019-12-19 (×2): qty 5

## 2019-12-19 NOTE — Evaluation (Signed)
Physical Therapy Evaluation Patient Details Name: [name] [name] MRN: [id number] DOB: [birth year] Today's Date: 12/19/2019   History of Present Illness  79 y.o. male with medical history significant of paroxysmal atrial flutter on Eliquis, CAD, chronic systolic CHF (EF 25 to 30% on echo done 10/03/2019), hypertension, hyperlipidemia, history of aortic valve replacement, aortic aneurysm status post repair, chronically incarcerated left inguinal hernia status post repair on 12/02/2019 presenting to the ED with a chief complaint of abdominal pain. In ED CT of abdomen pelvis showing recurrent left inguinal hernia which currently contains short segment of sigmoid colon.  Colon proximal to the hernia is diffusely dilated and fluid filled with air fluid levels, suspicious for partial colonic obstruction. Admitted 6/29 for treatment of left inguinal hernia and partial colonic obstruction with hypotension. Laproscopic repair of hernia 12/18/19.  Clinical Impression  PTA pt living in mobile home with 4 steps to enter. Pt requires assist for lower body bathing and dressing and iADLs. Pt is currently very lethargic after surgery yesterday evening and is limited in safe mobility by decreased strength and balance. Pt is modA for bed mobility, transfers and ambulation of 3 feet from bed to recliner. PT currently recommending SNF level rehab for improving strength and balance for safe mobility in his home environment. PT will continue to follow acutely.     Follow Up Recommendations SNF    Equipment Recommendations  None recommended by PT       Precautions / Restrictions Precautions Precautions: Fall Restrictions Weight Bearing Restrictions: No      Mobility  Bed Mobility Overal bed mobility: Needs Assistance Bed Mobility: Rolling;Sidelying to Sit Rolling: Min assist Sidelying to sit: Min assist       General bed mobility comments: min A for reaching across to bed rail to come to sidelying, min A for  trunk to upright and pad scoot of hips to EoB  Transfers Overall transfer level: Needs assistance Equipment used: 1 person hand held assist Transfers: Sit to/from Stand Sit to Stand: Mod assist         General transfer comment: modA for power up and steadying in standing pt with increased posterior lean, improved balance with CoG over BoS   Ambulation/Gait Ambulation/Gait assistance: Mod assist Gait Distance (Feet): 3 Feet Assistive device: 1 person hand held assist Gait Pattern/deviations: Step-to pattern;Shuffle;Decreased stride length Gait velocity: slowed Gait velocity interpretation: <1.31 ft/sec, indicative of household ambulator General Gait Details: modA for face to face stepping transfer from bed to recliner      Balance Overall balance assessment: Needs assistance Sitting-balance support: Feet supported;Bilateral upper extremity supported Sitting balance-Leahy Scale: Poor     Standing balance support: Bilateral upper extremity supported Standing balance-Leahy Scale: Poor Standing balance comment: requires outside support                             Pertinent Vitals/Pain Pain Assessment: No/denies pain    Home Living Family/patient expects to be discharged to:: Private residence Living Arrangements: Spouse/significant other Available Help at Discharge: Family;Available 24 hours/day Type of Home: Mobile home Home Access: Stairs to enter Entrance Stairs-Rails: Right Entrance Stairs-Number of Steps: 3 Home Layout: One level Home Equipment: Walker - 2 wheels;Cane - single point;Shower seat;Toilet riser;Grab bars - tub/shower;Hand held shower head      Prior Function Level of Independence: Independent with assistive device(s)      ADL's / Homemaking Assistance Needed: spouse assisting with LB ADL  Comments: used  cane for ambulation around the house and RW for when wife is gone or he is in the community      Hand Dominance   Dominant Hand:  Right    Extremity/Trunk Assessment   Upper Extremity Assessment Upper Extremity Assessment: Defer to OT evaluation    Lower Extremity Assessment Lower Extremity Assessment: Generalized weakness    Cervical / Trunk Assessment Cervical / Trunk Assessment: Kyphotic  Communication   Communication: HOH  Cognition Arousal/Alertness: Lethargic;Suspect due to medications Behavior During Therapy: [facility] for tasks assessed/performed Overall Cognitive Status: Within Functional Limits for tasks assessed                                        General Comments General comments (skin integrity, edema, etc.): pt's family present during session, pt on 1.5L O2 via [place], SaO2 95%O2, removed [place] and SaO2 maintained SaO2 >90% on RA, BP 95/47, HR max 106bpm         Assessment/Plan    PT Assessment Patient needs continued PT services  PT Problem List Decreased strength;Decreased balance;Decreased mobility;Decreased coordination       PT Treatment Interventions DME instruction;Gait training;Functional mobility training;Therapeutic activities;Therapeutic exercise;Balance training;Stair training;Patient/family education    PT Goals (Current goals can be found in the Care Plan section)  Acute Rehab PT Goals Patient Stated Goal: to go home PT Goal Formulation: With patient/family Time For Goal Achievement: 12/17/19 Potential to Achieve Goals: Good    Frequency Min 3X/week    AM-PAC PT "6 Clicks" Mobility  Outcome Measure Help needed turning from your back to your side while in a flat bed without using bedrails?: A Lot Help needed moving from lying on your back to sitting on the side of a flat bed without using bedrails?: A Lot Help needed moving to and from a bed to a chair (including a wheelchair)?: A Lot Help needed standing up from a chair using your arms (e.g., wheelchair or bedside chair)?: A Lot Help needed to walk in hospital room?: Total Help needed climbing 3-5 steps  with a railing? : Total 6 Click Score: 10    End of Session Equipment Utilized During Treatment: Gait belt Activity Tolerance: Patient limited by lethargy Patient left: in chair;with call bell/phone within reach;with family/visitor present;with chair alarm set Nurse Communication: Mobility status PT Visit Diagnosis: Unsteadiness on feet (R26.81);Other abnormalities of gait and mobility (R26.89);Muscle weakness (generalized) (M62.81);Difficulty in walking, not elsewhere classified (R26.2)    Time: 3220-2542 PT Time Calculation (min) (ACUTE ONLY): 32 min   Charges:   PT Evaluation $PT Eval Moderate Complexity: 1 Mod PT Treatments $Therapeutic Activity: 8-22 mins        Alberta Cairns B. Beverely Risen PT, DPT Acute Rehabilitation Services Pager [phone] Office [phone]   Elon Alas [facility] 12/19/2019, 3:18 PM

## 2019-12-19 NOTE — Progress Notes (Signed)
Held metoprolol at 1400 due to soft BP 87/48.  HR is in 100s.  Hinton Dyer, RN

## 2019-12-19 NOTE — Progress Notes (Signed)
Patient ID: [name] [name], male   DOB: 06/18/1941, 79 y.o.   MRN: [id number] Tolerating clears. Sleepy. Appreciate [name] F/U. PT eval noted. I spoke with his wife and daughter.  Violeta Gelinas, MD, MPH, FACS Please use [email or web address] to contact on call provider

## 2019-12-19 NOTE — Progress Notes (Signed)
1 Day Post-Op   Subjective/Chief Complaint: Wants ice chips, no nausea, minimal pain   Objective: Vital signs in last 24 hours: Temp:  [97.9 F (36.6 C)-98.6 F (37 C)] 98 F (36.7 C) (07/02 0434) Pulse Rate:  [70-124] 83 (07/02 0108) Resp:  [17-24] 17 (07/02 0434) BP: (101-140)/(43-81) 134/62 (07/02 0434) SpO2:  [95 %-100 %] 100 % (07/02 0108) Weight:  [81.1 kg] 81.1 kg (07/02 0434) Last BM Date: 12/17/19  Intake/Output from previous day: 07/01 0701 - 07/02 0700 In: 1781.7 [I.V.:1131.7; IV Piggyback:500] Out: 1350 [Urine:1250; Blood:100] Intake/Output this shift: Total I/O In: 1325 [I.V.:925; Other:150; IV Piggyback:250] Out: 350 [Urine:250; Blood:100]  General appearance: cooperative Resp: few rales Cardio: irregularly irregular rhythm GI: soft, some distention but NT and +BS, incisions OK Male genitalia: some edema L scrotum, L groin incision OK, hernia repair intact  Lab Results:  Recent Labs    12/18/19 0404 12/19/19 0314  WBC 8.5 7.9  HGB 9.9* 9.9*  HCT 33.7* 33.0*  PLT 262 234   BMET Recent Labs    12/18/19 0404 12/19/19 0314  NA 148* 150*  K 4.0 3.9  CL 117* 118*  CO2 19* 21*  GLUCOSE 76 97  BUN 33* 24*  CREATININE 1.56* 1.37*  CALCIUM 8.7* 8.4*   PT/INR No results for input(s): LABPROT, INR in the last 72 hours. ABG No results for input(s): PHART, HCO3 in the last 72 hours.  Invalid input(s): PCO2, PO2  Studies/Results: DG Abd Portable 1 View  Result Date: 12/17/2019 CLINICAL DATA:  Post NG tube placement EXAM: PORTABLE ABDOMEN - 1 VIEW COMPARISON:  Chest x-ray 12/17/2019 FINDINGS: Status post endovascular repair of the thoracic aorta and median sternotomy incidentally noted. Gastric tube in the stomach, tip in the fundus. Side port below GE junction. Distended loops of bowel are partially visualized in the upper abdomen. Abdominal plain film excludes portions of the LEFT flank and pelvis. Visualized skeletal structures without acute  process. IMPRESSION: 1. Gastric tube in the stomach. 2. Distended loops of bowel are partially visualized in the upper abdomen. Abdominal plain film excludes portions of the LEFT flank and pelvis. Electronically Signed   By: Donzetta Kohut M.D.   On: 12/17/2019 07:57    Anti-infectives: Anti-infectives (From admission, onward)   Start     Dose/Rate Route Frequency Ordered Stop   12/18/19 1925  polymyxin B 500,000 Units, bacitracin 50,000 Units in sodium chloride 0.9 % 500 mL irrigation  Status:  Discontinued          As needed 12/18/19 2057 12/18/19 2057      Assessment/Plan: Recurrent incarcerated LIH with colonic obstruction - S/P laparoscopic assisted repair of recurrent incarcerated LIH with mesh 7/1 by Wilson/Jaretzy Lhommedieu, D/C NGT, clears, PT/OT  Appreciate TRH and Cardiology management of multiple medical issues.   LOS: 2 days    Liz Malady 12/19/2019

## 2019-12-19 NOTE — Progress Notes (Addendum)
Progress Note  Patient Name: [name] [name] Date of Encounter: 12/19/2019  [facility] Cardiologist: Lance Muss, MD   Subjective   No chest pain or dyspnea. Wants ice chips.   Inpatient Medications    Scheduled Meds: . Chlorhexidine Gluconate Cloth  6 each Topical Daily  . enoxaparin (LOVENOX) injection  40 mg Subcutaneous Q24H  . metoprolol tartrate  2.5 mg Intravenous Q8H  . sodium chloride flush  3 mL Intravenous Once   Continuous Infusions: . sodium chloride 50 mL (12/18/19 2308)  . amiodarone 30 mg/hr (12/18/19 2303)  . lactated ringers    . methocarbamol (ROBAXIN) IV     PRN Meds: fentaNYL (SUBLIMAZE) injection, methocarbamol (ROBAXIN) IV, metoprolol tartrate, phenol, prochlorperazine   Vital Signs    Vitals:   12/18/19 2253 12/19/19 0108 12/19/19 0434 12/19/19 0823  BP: (!) 105/43 (!) 101/51 134/62 (!) 101/41  Pulse: 88 83  93  Resp:   17 18  Temp:   98 F (36.7 C) 98.4 F (36.9 C)  TempSrc:   Oral Oral  SpO2: 100% 100%  100%  Weight:   81.1 kg   Height:        Intake/Output Summary (Last 24 hours) at 12/19/2019 0840 Last data filed at 12/18/2019 2050 Gross per 24 hour  Intake 1781.65 ml  Output 1350 ml  Net 431.65 ml   Last 3 Weights 12/19/2019 12/18/2019 12/18/2019  Weight (lbs) 178 lb 12.7 oz 172 lb 4.8 oz 172 lb 4.8 oz  Weight (kg) 81.1 kg 78.155 kg 78.155 kg      Telemetry    Atrial fibrillation at rate of 90s - Personally Reviewed  ECG    N/A  Physical Exam   GEN: No acute distress.   Neck: No JVD Cardiac: IR IR , no murmurs, rubs, or gallops.  Respiratory: Clear to auscultation bilaterally. GI: distended with incision mark MS: No edema; No deformity. Neuro:  Nonfocal  Psych: Normal affect   Labs    High Sensitivity Troponin:   Recent Labs  Lab 12/16/19 2230 12/17/19 0013 12/17/19 0429  TROPONINIHS 17 20* 22*      Chemistry Recent Labs  Lab 12/16/19 1841 12/16/19 1841 12/17/19 0429 12/18/19 0404  12/19/19 0314  NA 144   < > 145 148* 150*  K 4.4   < > 4.6 4.0 3.9  CL 108   < > 113* 117* 118*  CO2 23   < > 24 19* 21*  GLUCOSE 114*   < > 90 76 97  BUN 41*   < > 38* 33* 24*  CREATININE 1.72*   < > 1.52* 1.56* 1.37*  CALCIUM 9.4   < > 8.6* 8.7* 8.4*  PROT 7.5  --   --   --   --   ALBUMIN 4.0  --   --   --   --   AST 34  --   --   --   --   ALT 24  --   --   --   --   ALKPHOS 56  --   --   --   --   BILITOT 0.7  --   --   --   --   GFRNONAA 37*   < > 43* 41* 48*  GFRAA 43*   < > 49* 48* 56*  ANIONGAP 13   < > 8 12 11    < > = values in this interval not displayed.     Hematology Recent  Labs  Lab 12/16/19 1841 12/18/19 0404 12/19/19 0314  WBC 9.9 8.5 7.9  RBC 3.89* 3.28* 3.16*  HGB 11.8* 9.9* 9.9*  HCT 39.1 33.7* 33.0*  MCV 100.5* 102.7* 104.4*  MCH 30.3 30.2 31.3  MCHC 30.2 29.4* 30.0  RDW 19.4* 19.3* 19.4*  PLT 369 262 234    BNP Recent Labs  Lab 12/17/19 0429  BNP 587.5*     Radiology    No results found.  Cardiac Studies   None this admission   Patient Profile     79 y.o. 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   Assessment & Plan    1. Persistent atrial fibrillation/flutter - Home Toprol XL on Hold while NPO - Rate is controlled on amiodarone gtt >> ? Will need PO  - Eliquis on hold >> resume when okay with surgery - Given soft BP will reduce IV metoprolol 2.5mg  to q 8 hours  2. Chronic systolic CHF - Volume status stable - BB, Lasix, losartan, and spironolactone  - Watch closely for volume overload>> likely need PRN lasix  3. CAD with chronically occluded RCA - No  angina -cardiac meds on hold   4. Incarcerated left inguinal hernia s/p repair with mesh - Per surgery   Will follow along.   For questions or updates, please contact [facility] Please consult [email or web address] for contact info under        Signed, Manson Passey, PA  12/19/2019, 8:40 AM    I have examined the patient and reviewed assessment and plan and discussed with patient.  Agree with above as stated.    Still in rate controlled atrial flutter.  Continue IV amnio.  IV beta-blocker as well.  Resume anticoagulation when okay from surgical standpoint.  Patient states that his NG tube is coming out later today.  Hopefully, his heart rate control will improve as he is more comfortable.  Hopefully, can at least start IV heparin tomorrow, if okay with surgery.  If he has issues with respiratory status, can give a dose of Lasix.  Known low EF, thought to be tachycardia mediated.  Lance Muss

## 2019-12-19 NOTE — Progress Notes (Signed)
Occupational Therapy Evaluation Patient Details Name: [name] [name] MRN: [id number] DOB: Feb 03, 1941 Today's Date: 12/19/2019    History of Present Illness 79 y.o. male with medical history significant of paroxysmal atrial flutter on Eliquis, CAD, chronic systolic CHF (EF 25 to 30% on echo done 10/03/2019), hypertension, hyperlipidemia, history of aortic valve replacement, aortic aneurysm status post repair, chronically incarcerated left inguinal hernia status post repair on 12/02/2019 presenting to the ED with a chief complaint of abdominal pain. In ED CT of abdomen pelvis showing recurrent left inguinal hernia which currently contains short segment of sigmoid colon.  Colon proximal to the hernia is diffusely dilated and fluid filled with air fluid levels, suspicious for partial colonic obstruction. Admitted 6/29 for treatment of left inguinal hernia and partial colonic obstruction with hypotension. Laproscopic repair of hernia 12/18/19.   Clinical Impression   Prior to hospitalization, pt was living with his spouse in a mobile home with 3 STE and R hand railing. Pt was Mod I with ADLs (excluding LB dressing/bathing recently) and functional mobility, primarily using a SPC to ambulate around the house and a RW in the community. Pt was splitting up household chores with daughter and spouse (until recently). [name] daughter lives 2 doors down. Pt has 24/7 support at home. Spouse/daughter confirm having all the necessary AE/DME for the pt at home.   Today, pt received semi-reclined in bed. Daughter and spouse present to confirm PLOF and home environment setup. Pt did not c/o pain today, however pt experiencing increased lethargy and drowsiness, causing pt difficulty to keep eyes open throughout OT eval. Bed level OT eval completed today, will continue to assess functional mobility and ADL function with EOB/OOB self-care activities. Mild swelling noted in bilateral hands. [name] R shoulder is fairly functional but  does not range to 90 degrees shoulder flexion secondary to recent R shoulder injury (after sustaining mechanical fall). Pt required setup at bed level for grooming. Pt able to wash face with Mod I using R hand and extended time. Unable to fully assess all over ADLs secondary to lethargy/drowsiness. Pt admitted for above and limited by decreased strength/ROM, decreased activity tolerance, decreased balance, and decreased endurance. Recommending SNF placement at this point, will continue to assess with OOB activity and follow pt as able acutely.       Follow Up Recommendations  SNF;Supervision/Assistance - 24 hour;Other (comment) (if family declines HHOT strict 24/7 S/A)    Equipment Recommendations  None recommended by OT    Recommendations for Other Services       Precautions / Restrictions Precautions Precautions: Fall Restrictions Weight Bearing Restrictions: No      Mobility Bed Mobility               General bed mobility comments: bed level eval; pt too lethargic/drowsy for bed mobility; will continue to assess  Transfers                 General transfer comment: bed level eval today; will continue to assess    Balance               Standing balance comment: bed level eval today, will continue to assess                           ADL either performed or assessed with clinical judgement   ADL Overall ADL's : Needs assistance/impaired Eating/Feeding: Set up;Sitting   Grooming: Wash/dry face;Set up;Bed level Grooming Details (indicate cue  type and reason): washed face with setup; HOB elevated Upper Body Bathing: Maximal assistance;Bed level   Lower Body Bathing: Maximal assistance;Bed level   Upper Body Dressing : Maximal assistance;Bed level   Lower Body Dressing: Maximal assistance;Bed level   Toilet Transfer: Moderate assistance;Maximal assistance;Stand-pivot;BSC;RW   Toileting- Clothing Manipulation and Hygiene: Maximal assistance;Bed  level       Functional mobility during ADLs:  (bed level eval) General ADL Comments: bed level OT eval performed today secondary to increased lethargy, fatigue, and drowsiness; will continue to assess ADLs and functional mobility with more functional activity at EOB and OOB     Vision Baseline Vision/History: Wears glasses Wears Glasses: At all times Patient Visual Report: Other (comment) (wife 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 (monthly injections)) Vision Assessment?: Vision impaired- to be further tested in functional context Additional Comments: 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; pt has difficulty keeping R eye open right now because he hasn't been getting his monthly injections            Pertinent Vitals/Pain Pain Assessment: No/denies pain     Hand Dominance Right   Extremity/Trunk Assessment Upper Extremity Assessment Upper Extremity Assessment: RUE deficits/detail RUE Deficits / Details: shoulder flexion to about 50 degrees secondary to shoulder injury in March (after sustaining mechanical fall) RUE: Unable to fully assess due to immobilization (previous shoulder injury; was receiving HHOT for shoulder ) RUE Sensation: WNL RUE Coordination: decreased gross motor   Lower Extremity Assessment Lower Extremity Assessment: Defer to PT evaluation   Cervical / Trunk Assessment Cervical / Trunk Assessment: Kyphotic   Communication Communication Communication: HOH   Cognition Arousal/Alertness: Lethargic;Suspect due to medications Behavior During Therapy: [facility] for tasks assessed/performed Overall Cognitive Status: Within Functional Limits for tasks assessed                                 General Comments: difficulty keeping eyes open; tends to close R eye secondary to occluded vessel (usually receives injections)   General Comments  bed level OT eval completed today secondary to increased lethargy/drowsiness/weakness; will continue to  assess OOB self care function; daughter and wife present            Home Living Family/patient expects to be discharged to:: Private residence Living Arrangements: Spouse/significant other Available Help at Discharge: Family;Available 24 hours/day Type of Home: Mobile home Home Access: Stairs to enter Entrance Stairs-Number of Steps: 3 Entrance Stairs-Rails: Right Home Layout: One level     Bathroom Shower/Tub: Producer, television/film/video: Standard Bathroom Accessibility: Yes How Accessible: Accessible via walker Home Equipment: Walker - 2 wheels;Cane - single point;Shower seat;Toilet riser;Grab bars - tub/shower;Hand held shower head          Prior Functioning/Environment Level of Independence: Independent with assistive device(s)    ADL's / Homemaking Assistance Needed: spouse assisting with LB ADL   Comments: used cane for ambulation around the house and RW for when wife is gone or he is in the community         OT Problem List: Decreased strength;Decreased range of motion;Decreased activity tolerance;Impaired balance (sitting and/or standing);Impaired vision/perception;Decreased coordination;Decreased safety awareness;Impaired UE functional use;Increased edema      OT Treatment/Interventions: Self-care/ADL training;Therapeutic exercise;Neuromuscular education;Energy conservation;DME and/or AE instruction;Therapeutic activities;Patient/family education;Balance training    OT Goals(Current goals can be found in the care plan section) Acute Rehab OT Goals Patient Stated Goal: to go home  OT Goal Formulation: With patient/family Time For Goal Achievement: 01/02/20 Potential to Achieve Goals: Good  OT Frequency: Min 3X/week          AM-PAC OT "6 Clicks" Daily Activity     Outcome Measure Help from another person eating meals?: A Little Help from another person taking care of personal grooming?: A Little Help from another person toileting, which includes using  toliet, bedpan, or urinal?: A Lot Help from another person bathing (including washing, rinsing, drying)?: A Lot Help from another person to put on and taking off regular upper body clothing?: A Lot Help from another person to put on and taking off regular lower body clothing?: A Lot 6 Click Score: 14   End of Session Nurse Communication: Mobility status  Activity Tolerance: Patient limited by lethargy;Patient limited by fatigue;Other (comment) (suspect lethargy from recent surgery) Patient left: in bed;with call bell/phone within reach;with bed alarm set;with family/visitor present  OT Visit Diagnosis: Muscle weakness (generalized) (M62.81);Unsteadiness on feet (R26.81);Repeated falls (R29.6)                Time: 4628-6381 OT Time Calculation (min): 34 min Charges:  OT General Charges $OT Visit: 1 Visit OT Evaluation $OT Eval Moderate Complexity: 1 Mod OT Treatments $Therapeutic Activity: 8-22 mins  Norris Cross, OTR/L [facility] Acute Rehab Services [phone]   Mechele Claude 12/19/2019, 7:49 PM

## 2019-12-19 NOTE — TOC Initial Note (Signed)
Transition of Care [facility]) - Initial/Assessment Note    Patient Details  Name: [name] [name] MRN: [id number] Date of Birth: 1941-04-07  Transition of Care [facility]) CM/SW Contact:    Jacquelynn Cree Phone Number: 12/19/2019, 5:20 PM  Clinical Narrative:                 CSW met with patient, spouse Collie Siad, & daughter Almyra Free. Patient was aroused when CSW entered room and went back to sleep. CSW explained PT recommendation of short-term SNF placement at discharge to spouse and daughter.   Ms. Brockbank stated family lives in a one-level mobile home. PTA, patient was independent and mobile with a cane. Ms. Illescas and daughter expressed understanding of PT recommendation but would like to wait for patient to be reassessed by PT before moving forward. Ms. Daubenspeck stated patient has had a lot of procedures in the last few weeks, which could be contributing to current mobility weakness.  CSW expressed understanding and agreed to follow-up with family once patient is reassessed by PT. No further questions expressed at this time. TOC team will continue to follow.  Expected Discharge Plan: Skilled Nursing Facility Barriers to Discharge: Continued Medical Work up, Ship broker   Patient Goals and CMS Choice   CMS Medicare.gov Compare Post Acute Care list provided to:: Patient Represenative (must comment) Collie Siad) Choice offered to / list presented to : Spouse  Expected Discharge Plan and Services Expected Discharge Plan: [place]       Living arrangements for the past 2 months: Mobile Home                                      Prior Living Arrangements/Services Living arrangements for the past 2 months: Mobile Home Lives with:: Spouse Patient language and need for interpreter reviewed:: Yes Do you feel safe going back to the place where you live?: Yes      Need for Family Participation in Patient Care: No (Comment) Care giver support system in place?:  Yes (comment)   Criminal Activity/Legal Involvement Pertinent to Current Situation/Hospitalization: No - Comment as needed  Activities of Daily Living Home Assistive Devices/Equipment: Eyeglasses, Cane (specify quad or straight), Walker (specify type) ADL Screening (condition at time of admission) Patient's cognitive ability adequate to safely complete daily activities?: Yes Is the patient deaf or have difficulty hearing?: Yes Does the patient have difficulty seeing, even when wearing glasses/contacts?: No Does the patient have difficulty concentrating, remembering, or making decisions?: No Patient able to express need for assistance with ADLs?: Yes Does the patient have difficulty dressing or bathing?: No Independently performs ADLs?: Yes (appropriate for developmental age) Does the patient have difficulty walking or climbing stairs?: Yes Weakness of Legs: Both Weakness of Arms/Hands: None  Permission Sought/Granted Permission sought to share information with : Family Supports Permission granted to share information with : Yes, Verbal Permission Granted  Share Information with NAME: Collie Siad     Permission granted to share info w Relationship: Spouse  Permission granted to share info w Contact Information: [phone]  Emotional Assessment   Attitude/Demeanor/Rapport: Unable to Assess Affect (typically observed): Unable to Assess Orientation: : Oriented to Self, Oriented to Place, Oriented to  Time, Oriented to Situation Alcohol / Substance Use: Not Applicable Psych Involvement: No (comment)  Admission diagnosis:  [place] [R09.89] AKI (acute kidney injury) ([place]) [N17.9] Inguinal hernia of left side with obstruction [K40.30] Patient  Active Problem List   Diagnosis Date Noted  . Inguinal hernia of left side with obstruction 12/17/2019  . Hypotension 12/17/2019  . AKI (acute kidney injury) ([place]) 12/17/2019  . Incarcerated left inguinal hernia 12/01/2019  . Pressure injury of skin  10/06/2019  . Acute on chronic respiratory failure with hypoxia ([place]) 10/03/2019  . Left inguinal hernia 10/03/2019  . Multiple fractures of ribs, right side, init for clos fx 09/04/2019  . Chest pain 01/28/2015  . Near syncope 01/28/2015  . Chronic diastolic heart failure ([place]) 01/28/2015  . HCAP (healthcare-associated pneumonia) 10/11/2013  . Acute respiratory failure with hypoxia ([place]) 10/07/2013  . Shock circulatory ([place]) 10/07/2013  . UTI (urinary tract infection) 10/06/2013  . Recurrent UTI 10/06/2013  . Urinary retention 10/06/2013  . Insomnia 09/04/2013  . Bradycardia 08/23/2013  . Acute on chronic diastolic heart failure ([place]) 08/22/2013  . Atrial fibrillation ([place]) 05/21/2013  . Atrial flutter ([place])   . Acute on chronic systolic heart failure ([place]) 05/11/2013  . Aortic atherosclerosis ([place]) 05/05/2013  . Aortic dissection ([place]) 05/04/2013  . Hypochromic anemia 05/04/2013  . CAD (coronary artery disease)   . S/P AVR (aortic valve replacement)   . Hyperlipidemia   . HTN (hypertension)    PCP:  Street, Sharon Mt, MD Pharmacy:   [facility] [place] [address] Phone: [phone] Fax: [phone]     Social Determinants of Health (SDOH) Interventions    Readmission Risk Interventions No flowsheet data found.

## 2019-12-19 NOTE — Progress Notes (Signed)
PROGRESS NOTE    [name] [name]  CHY:[id number] DOB: 04-23-1941 DOA: 12/16/2019 PCP: Casper Harrison, [name], MD    Chief Complaint  Patient presents with  . Abdominal Pain    Brief Narrative:  79 year old male with prior h/o PAF on eliquis, CAD, chronic systolic HF, hypertension, hyperlipidemia, h/o AV replacement, AA s/p repair, incarcerated left inguinal hernia s/p repair on 12/02/2019, presents to ED with abd pain.  He was found to have partial colonic obstruction. General surgery consulted.  Underwent  LAP  Repair .  Cardiology consulted for atrial flutter and pre op clearance.  Post op pt able to pass flatus and he was started on clear liquid diet.    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 Patient currently is able to pass flatus, he was started on clear liquid diet Ambulate as tolerated, pain control.    Persistent atrial flutter/ fibrillation:  Started on IV Amiodarone for rate control.  Plan to start the patient on IV heparin in the morning if okay with surgery. Cardiology on board and appreciate recommendations to continue with IV amiodarone for now.   Chronic diastolic heart failure Patient does not appear to be in failure, gentle hydration with dextrose fluids. Last echocardiogram showed LVEF of 25 to 30%.  Continue with strict intake and output and daily weights.   AKI  probably from dehydration and poor oral intake.  Creatinine improved from 1.7 to 1.5 to 1.3 Continue with IV fluids and recheck creatinine in am.  Avoid nephrotoxins at this time.  Baseline creatinine around 1.1   Anemia of chronic  disease Baseline hemoglobin ranging from 8-10.  Currently at 9.9. Watch for blood loss anemia from surgery.     DVT prophylaxis:scd's Code Status: FULL CODE.  Family Communication: none at bedside.  Disposition:   Status is: Inpatient  Remains inpatient appropriate because:IV treatments appropriate due to intensity of illness or inability to take PO   Dispo: The patient is from: Home              Anticipated d/c is to: pending.               Anticipated d/c date is: > 3 days              Patient currently is not medically stable to d/c.       Consultants:   Cardiology  Surgery.    Procedures: lap repair of recurrent inguinal hernia with mesh scheduled for today.   Antimicrobials: Anti-infectives (From admission, onward)   Start     Dose/Rate Route Frequency Ordered Stop   12/18/19 1925  polymyxin B 500,000 Units, bacitracin 50,000 Units in sodium chloride 0.9 % 500 mL irrigation  Status:  Discontinued          As needed 12/18/19 2057 12/18/19 2057    .  Subjective: Alert and comfortable, able to pass flatus, no nausea, no chest pain or shortness of breath Objective: Vitals:   12/18/19 2253 12/19/19 0108 12/19/19 0434 12/19/19 0823  BP: (!) 105/43 (!) 101/51 134/62 (!) 101/41  Pulse: 88 83  93  Resp:   17 18  Temp:  98 F (36.7 C) 98.4 F (36.9 C)  TempSrc:   Oral Oral  SpO2: 100% 100%  100%  Weight:   81.1 kg   Height:        Intake/Output Summary (Last 24 hours) at 12/19/2019 1027 Last data filed at 12/18/2019 2050 Gross per 24 hour  Intake 1781.65 ml  Output 1350 ml  Net 431.65 ml   Filed Weights   12/18/19 0247 12/18/19 0411 12/19/19 0434  Weight: 78.2 kg 78.2 kg 81.1 kg    Examination:  General exam: Alert comfortable not in any kind of distress Respiratory system: Air entry fair bilateral, no wheezing or rhonchi Cardiovascular system: S1-S2 heard, tachycardic, irregularly irregular, no JVD, no pedal edema Gastrointestinal system: Abdomen  is firm, mildly tender, bowel sounds normal Central nervous system: Alert and oriented to person and place Extremities: No pedal edema Skin: No rashes seen Psychiatry: Mood is appropriate    Data Reviewed: I have personally reviewed following labs and imaging studies  CBC: Recent Labs  Lab 12/16/19 1841 12/18/19 0404 12/19/19 0314  WBC 9.9 8.5 7.9  HGB 11.8* 9.9* 9.9*  HCT 39.1 33.7* 33.0*  MCV 100.5* 102.7* 104.4*  PLT 369 262 234    Basic Metabolic Panel: Recent Labs  Lab 12/16/19 1841 12/17/19 0429 12/18/19 0404 12/19/19 0314  NA 144 145 148* 150*  K 4.4 4.6 4.0 3.9  CL 108 113* 117* 118*  CO2 23 24 19* 21*  GLUCOSE 114* 90 76 97  BUN 41* 38* 33* 24*  CREATININE 1.72* 1.52* 1.56* 1.37*  CALCIUM 9.4 8.6* 8.7* 8.4*  MG  --  2.4  --   --     GFR: Estimated Creatinine Clearance: 45.1 mL/min (A) (by C-G formula based on SCr of 1.37 mg/dL (H)).  Liver Function Tests: Recent Labs  Lab 12/16/19 1841  AST 34  ALT 24  ALKPHOS 56  BILITOT 0.7  PROT 7.5  ALBUMIN 4.0    CBG: No results for input(s): GLUCAP in the last 168 hours.   Recent Results (from the past 240 hour(s))  SARS Coronavirus 2 by RT PCR (hospital order, performed in [facility] hospital lab) Nasopharyngeal Nasopharyngeal Swab     Status: None   Collection Time: 12/17/19  3:39 AM   Specimen: Nasopharyngeal Swab  Result Value Ref Range Status   SARS Coronavirus 2 NEGATIVE NEGATIVE Final    Comment: (NOTE) SARS-CoV-2 target nucleic acids are NOT DETECTED.  The SARS-CoV-2 RNA is generally detectable in upper and lower respiratory specimens during the acute phase of infection. The lowest concentration of SARS-CoV-2 viral copies this assay can detect is 250 copies / mL. A negative result does not preclude SARS-CoV-2 infection and should not be used as the sole basis for treatment or other patient management decisions.  A negative result may occur with improper specimen collection / handling,  submission of specimen other than nasopharyngeal swab, presence of viral mutation(s) within the areas targeted by this assay, and inadequate number of viral copies (<250 copies / mL). A negative result must be combined with clinical observations, patient history, and epidemiological information.  Fact Sheet for Patients:   BoilerBrush.com.cy  Fact Sheet for Healthcare Providers: https://pope.com/  This test is not yet approved or  cleared by the [place] FDA and has been authorized for detection and/or diagnosis of SARS-CoV-2 by FDA under an Emergency Use Authorization (EUA).  This EUA will remain in effect (meaning this test can be used) for the duration of the COVID-19  declaration under Section 564(b)(1) of the Act, 21 U.S.C. section 360bbb-3(b)(1), unless the authorization is terminated or revoked sooner.  Performed at [facility], [address], [place], [place] [address]          Radiology Studies: No results found.      Scheduled Meds: . Chlorhexidine Gluconate Cloth  6 each Topical Daily  . enoxaparin (LOVENOX) injection  40 mg Subcutaneous Q24H  . metoprolol tartrate  2.5 mg Intravenous Q8H  . sodium chloride flush  3 mL Intravenous Once   Continuous Infusions: . amiodarone 30 mg/hr (12/18/19 2303)  . dextrose    . lactated ringers    . methocarbamol (ROBAXIN) IV       LOS: 2 days        Kathlen Mody, MD [facility]   To contact the attending provider between 7A-7P or the covering provider during after hours 7P-7A, please log into the web site [email or web address] and access using universal [place] password for that web site. If you do not have the password, please call the hospital operator.  12/19/2019, 10:27 AM

## 2019-12-19 NOTE — Op Note (Signed)
NAME: [name] [name], [name] [name] MEDICAL RECORD [id number] ACCOUNT [id number] DATE OF BIRTH:1940/09/19 FACILITY: [facility] LOCATION: [id number] PHYSICIAN:Ansel Ferrall Elson Clan, MD  OPERATIVE REPORT  DATE OF PROCEDURE:  12/18/2019  PREOPERATIVE DIAGNOSIS:  Recurrent left incarcerated inguinal scrotal hernia.  POSTOPERATIVE DIAGNOSIS:  Recurrent left incarcerated indirect inguinal scrotal hernia.  PROCEDURES: 1.  Laparoscopic assisted repair of recurrent left incarcerated indirect inguinal scrotal hernia with mesh. 2.  Laparoscopic repair of colonic serosal tear. 3.  Laparoscopic bilateral transversus abdominis plane blocks.  SURGEON:  Gaynelle Adu, MD  Assistant:  Violeta Gelinas, MD (an assistant was needed due to the complexity of the incarcerated inguinoscrotal hernia, aid in identifying anatomy, tissue manipulation)  ANESTHESIA:  General.  ESTIMATED BLOOD LOSS:  100 mL.  SPECIMENS:  None.    LOCAL MEDICATIONS:  Marcaine and Exparel mixture.  INDICATIONS:  The patient is an 79 year old gentleman who underwent an open repair of an incarcerated left inguinal hernia with mesh by Dr. [name] on 12/02/2019.  He had a very large scrotal inguinal hernia.  He was kept overnight.  He was discharged  and was doing well until 06/29.  He had progressive distention and nausea.  He came to the ER for evaluation.  He was found to have a recurrent incarcerated left inguinal hernia causing a partial colonic obstruction.  He had acute kidney injury.  He was  admitted and nasogastric tube decompression was initiated.  His Eliquis was held.  He had no signs of bowel compromise necessitating urgent surgical intervention.  Cardiology did evaluate the patient and felt that he was at  6% risk for perioperative  cardiovascular events.  On 07/01, his Eliquis had been held the appropriate timeframe.  Dr. [name] had asked me to attempt repair laparoscopic cholecystectomy since during his open repair, he had a very attenuated  inguinal floor and he thought that a  laparoscopic approach, specifically a TAP approach might be an easier approach.  I had a long conversation with the patient and his family.  We discussed that even a laparoscopic approach can be challenging in these inguinal scrotal hernias due to the  large sac, as well as the fact that he had an incarcerated bowel.  We discussed that there is a moderate chance that we would have to do a portion or all of it open.  We also discussed at length the risks and benefits of the procedure, including that he  was at increased risk for injury to surrounding structures, hematoma or seroma formation, recurrence as well as infection.  DESCRIPTION OF PROCEDURE:  The patient was taken to OR #[facility] at [facility], placed supine on the operating room table.  General endotracheal anesthesia was established.  Sequential compression devices had been placed.  A Foley catheter was placed.   His arms were tucked at his sides with the appropriate padding.  His abdomen was prepped with ChloraPrep and his inguinal and scrotal area were prepped with Betadine.  He received IV antibiotic prior to skin incision.  I attempted to reduce the hernia  before he was prepped and draped,  but I was only able to partially reduce it.  Surgical timeout was performed.  A small incision was made in the supraumbilical location.  Fascia was grasped and lifted anteriorly.  Next, the fascia was incised and the  abdominal cavity was entered.  A pursestring suture of 0 Vicryl was placed around the fascial edges.  We then placed a 12 mm Hasson trocar and established pneumoperitoneum up to a patient  pressure of 15 mmHg without any change in patient's vital signs.   The patient was on a continuous infusion of amiodarone from the floor.  Abdominal cavity was surveilled.  There was no evidence of injury to surrounding structures.  He had colonic distention proximal to the incarcerated sigmoid colon.  It appeared to  be  consistent with a large indirect inguinal hernia.  Two additional 5 mm trocars were placed, 1 in the left and 1 in the right midabdomen, all under direct visualization after local was infiltrated.  Then, using atraumatic bowel graspers, I attempted to  reduce the incarcerated colon while Dr. [name] pushed externally.  We were really only able to partially reduce it.  It appeared that part of the epiploic appendages of the colon and part of the colon itself was adhered to the distal hernia sac.  In  the colon, just could not feel we could do this part safely.  We attempted for several minutes to reduce it.  We got some of the omentum out that was incarcerated, but again portions of the colonic lateral attachments were adhered to the actual hernia  sac itself and I could not get an adequate plane at that point in order to safely lyse it.  Moreover, there was a superficial serosal tear to the colon in trying to reduce it.  This was not an unexpected occurrence.  At this point, we decided to open up  the left inguinal incision and explore the groin that way.  The previous incision was opened up by Dr [name].  At this point, Dr. [name] performed the bulk of this procedure.  Previous sutures were excised.  The patient had a very large thick fibrotic hernia sac.   The inguinal canal and inguinal anatomy was very distorted and difficult to identify classic landmarks.  He had very little transversalis fascia.  It was very attenuated.  The previous mesh that had been sutured in was identified.  We were able to get our hand around the spermatic cord ultimately  and place a Penrose drain.  Again, the lower abdominal wall muscles and the conjoined tendon was very attenuated.  We decided to open up the hernia sac.  At this point, we were able to free the colon from the edges of the hernia sac internally with a  combination of blunt dissection, along with a right angle and Metzenbaum and Bovie electrocautery.   This took approximately 20-30 minutes to do.  At this point, we were able to reduce the colon in its entirety from the externalized hernia sac.  We wanted  to bring the colon out through the groin incision to inspect it, but there were still some internal attachments that were obvious.  We put towel clamps over the hernia sac opening and returned laparoscopically.  At this point, all the colon had been  reduced, but there was still some lateral attachments to the hernia sac internally.  At this point, I was able to free it with EndoShears with and without electrocautery, taking care not to injure the colon.  The peritoneum inferior to the inguinal ring,  so overlying the triangle of  doom had been entered.  I took down some of the lateral attachments of the colon along the white line of Toldt in order to mobilize it more.  At this point, the colon and all its attachments were freed from the inguinal  canal and hernia sac.  We reinspected the colon that had been reduced.  We  identified the area that had the serosal tear, which was probably about 1.5 cm long.  I placed 2 interrupted 3-0 silk sutures laparoscopically in a Lembert fashion to repair it.   There was no other evidence of injury to the colon.  I did not feel like a TAP transabdominal preperitoneal approach was useful at this point.  The peritoneum inferior to the inguinal opening was completely violated, so therefore there would obviously be  exposed mesh.  I did not feel therefore there was any reason to open up the preperitoneal space and do a classic flap.  We discussed leaving the mesh inside the abdomen versus external through the open incision, but again, the patient really had very  little external muscle in the inguinal area.  The patient had a very large indirect defect.  We decided that probably the best course of action was to just essentially do an IPOM over the indirect hernia space.  We decided to use a dual-sided mesh, one  with an  antiadhesive film since it would be in contact with the viscera.  We obtained a piece of Bard Ventralight ST mesh, 4 inches x 6 inches.  It was irrigated and moistened with antibiotic irrigation, placed through the Hasson trocar.  It was placed  in the left inguinal area.  I would say two-thirds of the mesh was lateral to the inferior epigastrics and one-third was a little bit medial to the inferior epigastric area to cover the direct space.  There was no direct defect.  Using an [employer]  SecureStrap tacker, I tacked the superior edge of the mesh to the lower abdominal wall, 1 on each side of the inferior epigastric vessel and then 3 out laterally.  Since the mesh was in contact with the viscera, we ensured that the antiadhesive side was  in contact.  I was concerned that the inferior edge of the mesh could roll up, so therefore, I did place 1 tack in the left lower quadrant just inferolateral to the inguinal canal opening.  This was not in the triangle of doom, but near the triangle of  pain.  I was concerned that the mesh could roll up and we did not place a tack in that location.  A bilateral Exparel and Marcaine block was performed using laparoscopic guidance.  The Hasson trocar was removed and the previously placed pursestring  suture was tied down, thus obliterating the fascial defect.  Pneumoperitoneum was released.  At this time, Dr. [name] closed the inguinal incision in multiple layers.  The initial layer was a 2-0 running SH in the sac.  He then closed the soft tissue  in 3 layers, 2-0 Vicryl, followed by 3-0 Vicryls, followed by 4-0 Monocryl in a subcuticular fashion.  Prior to closing the groin incision, it was irrigated with antibiotic irrigation.  Dermabond was applied.  The patient was extubated and taken to  recovery room in stable condition.  We updated the family at the conclusion.  VN/NUANCE  D:12/18/2019 T:12/19/2019 JOB:[id number]

## 2019-12-20 DIAGNOSIS — I4819 Other persistent atrial fibrillation: Secondary | ICD-10-CM

## 2019-12-20 LAB — BASIC METABOLIC PANEL
Anion gap: 7 (ref 5–15)
BUN: 25 mg/dL — ABNORMAL HIGH (ref 8–23)
CO2: 23 mmol/L (ref 22–32)
Calcium: 8.3 mg/dL — ABNORMAL LOW (ref 8.9–10.3)
Chloride: 115 mmol/L — ABNORMAL HIGH (ref 98–111)
Creatinine, Ser: 1.29 mg/dL — ABNORMAL HIGH (ref 0.61–1.24)
GFR calc Af Amer: >60 mL/min (ref >60)
GFR calc non Af Amer: 52 mL/min — ABNORMAL LOW (ref >60)
Glucose, Bld: 164 mg/dL — ABNORMAL HIGH (ref 70–99)
Potassium: 3.4 mmol/L — ABNORMAL LOW (ref 3.5–5.1)
Sodium: 145 mmol/L (ref 135–145)

## 2019-12-20 LAB — CBC
HCT: 29.8 % — ABNORMAL LOW (ref 39.0–52.0)
Hemoglobin: 8.9 g/dL — ABNORMAL LOW (ref 13.0–17.0)
MCH: 30.4 pg (ref 26.0–34.0)
MCHC: 29.9 g/dL — ABNORMAL LOW (ref 30.0–36.0)
MCV: 101.7 fL — ABNORMAL HIGH (ref 80.0–100.0)
Platelets: 190 10*3/uL (ref 150–400)
RBC: 2.93 MIL/uL — ABNORMAL LOW (ref 4.22–5.81)
RDW: 19.7 % — ABNORMAL HIGH (ref 11.5–15.5)
WBC: 8.6 10*3/uL (ref 4.0–10.5)
nRBC: 0 % (ref 0.0–0.2)

## 2019-12-20 MED ORDER — METOPROLOL TARTRATE 5 MG/5ML IV SOLN
2.5000 mg | Freq: Three times a day (TID) | INTRAVENOUS | Status: AC
  Administered 2019-12-20 (×2): 2.5 mg via INTRAVENOUS
  Filled 2019-12-20 (×2): qty 5

## 2019-12-20 MED ORDER — METOPROLOL TARTRATE 12.5 MG HALF TABLET: 12.5000 mg | ORAL_TABLET | Freq: Every day | ORAL | Status: DC

## 2019-12-20 MED ORDER — POTASSIUM CHLORIDE CRYS ER 20 MEQ PO TBCR
40.0000 meq | EXTENDED_RELEASE_TABLET | Freq: Once | ORAL | Status: AC
  Administered 2019-12-20: 40 meq via ORAL
  Filled 2019-12-20: qty 2

## 2019-12-20 MED ORDER — APIXABAN 5 MG PO TABS
5.0000 mg | ORAL_TABLET | Freq: Two times a day (BID) | ORAL | Status: DC
  Administered 2019-12-20 – 2019-12-22 (×5): 5 mg via ORAL
  Filled 2019-12-20 (×5): qty 1

## 2019-12-20 NOTE — Progress Notes (Signed)
PROGRESS NOTE    [name] [name]  CVE:[id number] DOB: [birth year] DOA: 12/16/2019 PCP: Casper Harrison, [name], MD    Chief Complaint  Patient presents with  . Abdominal Pain    Brief Narrative:  79 year old male with prior h/o PAF on eliquis, CAD, chronic systolic HF, hypertension, hyperlipidemia, h/o AV replacement, AA s/p repair, incarcerated left inguinal hernia s/p repair on 12/02/2019, presents to ED with abd pain. He was found to have partial colonic obstruction. General surgery consulted. Cardiology consulted for atrial flutter and pre op clearance. Patient Underwent LAP  Repair of the recurrent inguinal hernia by Dr Andrey Campanile on 12/18/19. Post op pt able to pass flatus and he was started on clear liquid diet and advance as tolerated. Start PT today.    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 Patient currently is able to pass flatus, he was started on clear liquid diet and advanced to heart healthy diet.  Ambulate as tolerated, pain control.    Persistent atrial flutter/ fibrillation:  Started on IV Amiodarone for rate control.  Cardiology on board and appreciate recommendations to continue with IV amiodarone for now. Start the patient on eliquis and oral metoprolol only if his SBP>139mmhg.    Chronic diastolic heart failure Patient does not appear to be in failure,. D/c IV fluids.  Last echocardiogram showed Diffuse hypokinesis worse in the septum, apex and akinesis of inferior base . Left ventricular ejection fraction, by estimation, is 25 to 30%. The left ventricle has severely decreased function. The left ventricle  demonstrates global hypokinesis. The left ventricular internal cavity size was mildly dilated. Left  ventricular diastolic parameters are indeterminate. Continue with strict intake and output and daily weights. Not on diuretics at this time.    AKI  probably from dehydration and poor oral intake.  Creatinine improved from 1.7 to 1.5 to 1.3 to 1.29.  Avoid nephrotoxins at this time.  Baseline creatinine around 1.1   Anemia of chronic disease Baseline hemoglobin ranging from 8-10.  Currently at 8.9 Watch for blood loss anemia from surgery.   Essential hypertension;  bp parameters are still borderline.   Hypernatremia:  Free water deficit and dehydration.  Resolved after dextrose fluids.     Hypokalemia:  Replaced.    DVT prophylaxis:scd's Code Status: FULL CODE.  Family Communication: none at bedside.  Disposition:   Status is: Inpatient  Remains inpatient appropriate because:IV treatments appropriate due to intensity of illness or inability to take PO   Dispo: The patient is from: Home              Anticipated d/c is to: pending.               Anticipated d/c date is: 1 day              Patient currently is not medically stable to d/c.       Consultants:   Cardiology  Surgery.    Procedures: lap repair of recurrent inguinal hernia with mesh scheduled for today.   Antimicrobials: Anti-infectives (From admission, onward)   Start     Dose/Rate Route Frequency Ordered Stop   12/18/19 1925  polymyxin  B 500,000 Units, bacitracin 50,000 Units in sodium chloride 0.9 % 500 mL irrigation  Status:  Discontinued          As needed 12/18/19 2057 12/18/19 2057    .  Subjective: Able to pass flatus, no chest pain or sob.  No nausea, vomiting.  Able to tolerate liquid diet.   Objective: Vitals:   12/20/19 0445 12/20/19 0515 12/20/19 0545 12/20/19 0807  BP: (!) 104/59 102/62 107/66 (!) 102/59  Pulse: (!) 114 (!) 104 (!) 116 99  Resp:    18  Temp:    98.7 F  (37.1 C)  TempSrc:    Oral  SpO2: 97% 96% 94% 96%  Weight:      Height:        Intake/Output Summary (Last 24 hours) at 12/20/2019 0929 Last data filed at 12/20/2019 0636 Gross per 24 hour  Intake 1562.37 ml  Output --  Net 1562.37 ml   Filed Weights   12/18/19 0411 12/19/19 0434 12/20/19 0400  Weight: 78.2 kg 81.1 kg 82.6 kg    Examination:  General exam: Alert laying in bed not in any distress  respiratory system: Diminished air entry at bases but no wheezing or rhonchi Cardiovascular system: S1-S2 heard, rate irregularly irregular, no JVD, no pedal edema Gastrointestinal system: Abdomen is soft mildly tender at the surgical site, bowel sounds normal Central nervous system: Alert and oriented to place and person Extremities: No pedal edema or cyanosis or clubbing Skin: No rashes seen Psychiatry: Mood is appropriate    Data Reviewed: I have personally reviewed following labs and imaging studies  CBC: Recent Labs  Lab 12/16/19 1841 12/18/19 0404 12/19/19 0314 12/20/19 0511  WBC 9.9 8.5 7.9 8.6  HGB 11.8* 9.9* 9.9* 8.9*  HCT 39.1 33.7* 33.0* 29.8*  MCV 100.5* 102.7* 104.4* 101.7*  PLT 369 262 234 190    Basic Metabolic Panel: Recent Labs  Lab 12/16/19 1841 12/17/19 0429 12/18/19 0404 12/19/19 0314 12/20/19 0511  NA 144 145 148* 150* 145  K 4.4 4.6 4.0 3.9 3.4*  CL 108 113* 117* 118* 115*  CO2 23 24 19* 21* 23  GLUCOSE 114* 90 76 97 164*  BUN 41* 38* 33* 24* 25*  CREATININE 1.72* 1.52* 1.56* 1.37* 1.29*  CALCIUM 9.4 8.6* 8.7* 8.4* 8.3*  MG  --  2.4  --   --   --     GFR: Estimated Creatinine Clearance: 47.9 mL/min (A) (by C-G formula based on SCr of 1.29 mg/dL (H)).  Liver Function Tests: Recent Labs  Lab 12/16/19 1841  AST 34  ALT 24  ALKPHOS 56  BILITOT 0.7  PROT 7.5  ALBUMIN 4.0    CBG: No results for input(s): GLUCAP in the last 168 hours.   Recent Results (from the past 240 hour(s))  SARS Coronavirus 2 by RT PCR (hospital order,  performed in [facility] hospital lab) Nasopharyngeal Nasopharyngeal Swab     Status: None   Collection Time: 12/17/19  3:39 AM   Specimen: Nasopharyngeal Swab  Result Value Ref Range Status   SARS Coronavirus 2 NEGATIVE NEGATIVE Final    Comment: (NOTE) SARS-CoV-2 target nucleic acids are NOT DETECTED.  The SARS-CoV-2 RNA is generally detectable in upper and lower respiratory specimens during the acute phase of infection. The lowest concentration of SARS-CoV-2 viral copies this assay can detect is 250 copies / mL. A negative result does not preclude SARS-CoV-2 infection and should not be used as the sole basis for treatment or  other patient management decisions.  A negative result may occur with improper specimen collection / handling, submission of specimen other than nasopharyngeal swab, presence of viral mutation(s) within the areas targeted by this assay, and inadequate number of viral copies (<250 copies / mL). A negative result must be combined with clinical observations, patient history, and epidemiological information.  Fact Sheet for Patients:   BoilerBrush.com.cy  Fact Sheet for Healthcare Providers: https://pope.com/  This test is not yet approved or  cleared by the [place] FDA and has been authorized for detection and/or diagnosis of SARS-CoV-2 by FDA under an Emergency Use Authorization (EUA).  This EUA will remain in effect (meaning this test can be used) for the duration of the COVID-19 declaration under Section 564(b)(1) of the Act, 21 U.S.C. section 360bbb-3(b)(1), unless the authorization is terminated or revoked sooner.  Performed at [facility], [address] [place], [place] [address]          Radiology Studies: No results found.      Scheduled Meds: . apixaban  5 mg Oral BID  . Chlorhexidine Gluconate Cloth  6 each Topical Daily  . metoprolol tartrate  2.5 mg Intravenous Q8H  .  potassium chloride  40 mEq Oral Once  . sodium chloride flush  3 mL Intravenous Once   Continuous Infusions: . amiodarone 30 mg/hr (12/19/19 2308)  . dextrose 50 mL/hr (12/20/19 0359)  . lactated ringers    . methocarbamol (ROBAXIN) IV       LOS: 3 days        Kathlen Mody, MD [facility]   To contact the attending provider between 7A-7P or the covering provider during after hours 7P-7A, please log into the web site [email or web address] and access using universal [place] password for that web site. If you do not have the password, please call the hospital operator.  12/20/2019, 9:29 AM

## 2019-12-20 NOTE — Progress Notes (Signed)
Progress Note  Patient Name: [name] [name] Date of Encounter: 12/20/2019  Primary Cardiologist: Lance Muss, MD  Subjective   No chest pain or palpitations.  No abdominal pain, a little sore when he moves around.  Inpatient Medications    Scheduled Meds: . Chlorhexidine Gluconate Cloth  6 each Topical Daily  . enoxaparin (LOVENOX) injection  40 mg Subcutaneous Q24H  . metoprolol tartrate  2.5 mg Intravenous Q8H  . sodium chloride flush  3 mL Intravenous Once   Continuous Infusions: . amiodarone 30 mg/hr (12/19/19 2308)  . dextrose 50 mL/hr (12/20/19 0359)  . lactated ringers    . methocarbamol (ROBAXIN) IV     PRN Meds: fentaNYL (SUBLIMAZE) injection, methocarbamol (ROBAXIN) IV, metoprolol tartrate, phenol, prochlorperazine   Vital Signs    Vitals:   12/20/19 0415 12/20/19 0445 12/20/19 0515 12/20/19 0545  BP: 95/64 (!) 104/59 102/62 107/66  Pulse: (!) 114 (!) 114 (!) 104 (!) 116  Resp:      Temp:      TempSrc:      SpO2: 94% 97% 96% 94%  Weight:      Height:        Intake/Output Summary (Last 24 hours) at 12/20/2019 0747 Last data filed at 12/20/2019 0636 Gross per 24 hour  Intake 1802.37 ml  Output --  Net 1802.37 ml   Filed Weights   12/18/19 0411 12/19/19 0434 12/20/19 0400  Weight: 78.2 kg 81.1 kg 82.6 kg    Telemetry    Atrial fibrillation, heart rate 100-110.  Personally reviewed.  ECG    An ECG dated 12/16/2019 was personally reviewed today and demonstrated:  Probable atrial fibrillation with IVCD and repolarization abnormalities.  Physical Exam   GEN: No acute distress.   Neck: No JVD. Cardiac:  Irregularly irregular, no murmur or gallop.  Respiratory: Nonlabored. Clear to auscultation bilaterally. GI: Soft, bowel sounds present. MS:  SCDs in place.  Labs    Chemistry Recent Labs  Lab 12/16/19 1841 12/17/19 0429 12/18/19 0404 12/19/19 0314 12/20/19 0511  NA 144   < > 148* 150* 145  K 4.4   < > 4.0 3.9 3.4*  CL 108   <  > 117* 118* 115*  CO2 23   < > 19* 21* 23  GLUCOSE 114*   < > 76 97 164*  BUN 41*   < > 33* 24* 25*  CREATININE 1.72*   < > 1.56* 1.37* 1.29*  CALCIUM 9.4   < > 8.7* 8.4* 8.3*  PROT 7.5  --   --   --   --   ALBUMIN 4.0  --   --   --   --   AST 34  --   --   --   --   ALT 24  --   --   --   --   ALKPHOS 56  --   --   --   --   BILITOT 0.7  --   --   --   --   GFRNONAA 37*   < > 41* 48* 52*  GFRAA 43*   < > 48* 56* >60  ANIONGAP 13   < > 12 11 7    < > = values in this interval not displayed.     Hematology Recent Labs  Lab 12/18/19 0404 12/19/19 0314 12/20/19 0511  WBC 8.5 7.9 8.6  RBC 3.28* 3.16* 2.93*  HGB 9.9* 9.9* 8.9*  HCT 33.7* 33.0* 29.8*  MCV 102.7*  104.4* 101.7*  MCH 30.2 31.3 30.4  MCHC 29.4* 30.0 29.9*  RDW 19.3* 19.4* 19.7*  PLT 262 234 190    Cardiac Enzymes Recent Labs  Lab 12/16/19 2230 12/17/19 0013 12/17/19 0429  TROPONINIHS 17 20* 22*    BNP Recent Labs  Lab 12/17/19 0429  BNP 587.5*     Radiology    No results found.  Cardiac Studies   Echocardiogram 10/03/2019: 1. Diffuse hypokinesis worse in the septum, apex and akinesis of inferior  base . Left ventricular ejection fraction, by estimation, is 25 to 30%.  The left ventricle has severely decreased function. The left ventricle  demonstrates global hypokinesis. The  left ventricular internal cavity size was mildly dilated. Left  ventricular diastolic parameters are indeterminate.  2. Right ventricular systolic function is normal. The right ventricular  size is normal. There is normal pulmonary artery systolic pressure.  3. Left atrial size was moderately dilated.  4. Right atrial size was moderately dilated.  5. The mitral valve is degenerative. Mild mitral valve regurgitation.  6. By history bioprosthetic AVR with trivial central AR and acceptable  mean gradient of 7 mmHg . The aortic valve has been repaired/replaced.  Aortic valve regurgitation is trivial. No aortic  stenosis is present.  There is a unknown a bioprosthetic valve  present in the aortic position.  7. ? dilated descending thoracic aorta seen on PsL views.   Patient Profile     79 y.o. 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  Assessment & Plan    1.  Persistent atrial fibrillation, heart rates mildly elevated.  CHA2DS2-VASc score is 5.  Patient off Eliquis and home dose of Toprol-XL while n.p.o. for surgery, now on IV amiodarone and divided dose IV Lopressor.  Blood pressure low normal this morning.  No reported palpitations.  2.  Ischemic cardiomyopathy with chronic systolic heart failure, LVEF 25 to 30%.  No active angina at this time.  Cardiac catheterization in April showed CTO of the RCA with collaterals, moderate LAD disease, patent stent in the proximal to mid circumflex, managed medically.  No evidence of fluid overload at this time.  He was on Lasix with potassium supplement, losartan, Toprol-XL, and Aldactone as an outpatient, all held at this time.  3.  Incarcerated left inguinal hernia status post mesh repair on July 1.  Chart reviewed including recent follow-up by cardiology service.  Would continue IV amiodarone at this time as well as low-dose IV Lopressor.  When patient able to take p.o. medications, would aim to resume Eliquis if okay with the surgical team.  At that point may also be able to convert from IV Lopressor back to metoprolol, although beginning at lower dose then outpatient based on recent blood pressures.  As activity increases so may blood pressure and we might be able to work back toward his outpatient regimen for cardiomyopathy.  Signed, Nona Dell, MD  12/20/2019, 7:47 AM

## 2019-12-20 NOTE — Progress Notes (Signed)
BP at 2200 90/51, HR >100.  Dr Loney Loh made aware, order received to hold scheduled Metoprolol and to check BP manually. Will continue to monitor.

## 2019-12-20 NOTE — Progress Notes (Signed)
Manual BP 110/62 at 2305.   Patient has not voided since foley cath removed >8 hrs ago. Bladder scan done and only 94 cc in bladder.  Will continue to monitor patient.

## 2019-12-20 NOTE — Progress Notes (Signed)
2 Days Post-Op   Subjective/Chief Complaint: Tolerated clears No nausea Passed gas Wants to eat  Objective: Vital signs in last 24 hours: Temp:  [98.3 F (36.8 C)-99.4 F (37.4 C)] 98.7 F (37.1 C) (07/03 0807) Pulse Rate:  [99-119] 99 (07/03 0807) Resp:  [18-20] 18 (07/03 0807) BP: (84-110)/(48-69) 102/59 (07/03 0807) SpO2:  [91 %-99 %] 96 % (07/03 0807) Weight:  [82.6 kg] 82.6 kg (07/03 0400) Last BM Date: 12/17/19  Intake/Output from previous day: 07/02 0701 - 07/03 0700 In: 1802.4 [P.O.:600; I.V.:502.4] Out: -  Intake/Output this shift: No intake/output data recorded.  General appearance: cooperative Resp: few rales Cardio: irregularly irregular rhythm GI: soft, +BS, NT, somewhat less distended Male genitalia: L groin incision OK  Lab Results:  Recent Labs    12/19/19 0314 12/20/19 0511  WBC 7.9 8.6  HGB 9.9* 8.9*  HCT 33.0* 29.8*  PLT 234 190   BMET Recent Labs    12/19/19 0314 12/20/19 0511  NA 150* 145  K 3.9 3.4*  CL 118* 115*  CO2 21* 23  GLUCOSE 97 164*  BUN 24* 25*  CREATININE 1.37* 1.29*  CALCIUM 8.4* 8.3*   PT/INR No results for input(s): LABPROT, INR in the last 72 hours. ABG No results for input(s): PHART, HCO3 in the last 72 hours.  Invalid input(s): PCO2, PO2  Studies/Results: No results found.  Anti-infectives: Anti-infectives (From admission, onward)   Start     Dose/Rate Route Frequency Ordered Stop   12/18/19 1925  polymyxin B 500,000 Units, bacitracin 50,000 Units in sodium chloride 0.9 % 500 mL irrigation  Status:  Discontinued          As needed 12/18/19 2057 12/18/19 2057      Assessment/Plan: Recurrent incarcerated LIH with colonic obstruction - S/P laparoscopic assisted repair of recurrent incarcerated LIH with mesh 7/1 by Wilson/Loden Laurent, advance to heart healthy diet. Resume Eliquis, may resume PO meds per Cardiology  Appreciate [facility] and Cardiology management of multiple medical issues.   Continue PT/OT   LOS: 3 days    Liz Malady 12/20/2019

## 2019-12-21 DIAGNOSIS — I255 Ischemic cardiomyopathy: Secondary | ICD-10-CM

## 2019-12-21 LAB — CBC
HCT: 30.5 % — ABNORMAL LOW (ref 39.0–52.0)
Hemoglobin: 9.1 g/dL — ABNORMAL LOW (ref 13.0–17.0)
MCH: 30.1 pg (ref 26.0–34.0)
MCHC: 29.8 g/dL — ABNORMAL LOW (ref 30.0–36.0)
MCV: 101 fL — ABNORMAL HIGH (ref 80.0–100.0)
Platelets: 169 10*3/uL (ref 150–400)
RBC: 3.02 MIL/uL — ABNORMAL LOW (ref 4.22–5.81)
RDW: 19.4 % — ABNORMAL HIGH (ref 11.5–15.5)
WBC: 6.6 10*3/uL (ref 4.0–10.5)
nRBC: 0 % (ref 0.0–0.2)

## 2019-12-21 LAB — BASIC METABOLIC PANEL
Anion gap: 7 (ref 5–15)
BUN: 24 mg/dL — ABNORMAL HIGH (ref 8–23)
CO2: 24 mmol/L (ref 22–32)
Calcium: 8.3 mg/dL — ABNORMAL LOW (ref 8.9–10.3)
Chloride: 115 mmol/L — ABNORMAL HIGH (ref 98–111)
Creatinine, Ser: 1.14 mg/dL (ref 0.61–1.24)
GFR calc Af Amer: >60 mL/min (ref >60)
GFR calc non Af Amer: >60 mL/min (ref >60)
Glucose, Bld: 111 mg/dL — ABNORMAL HIGH (ref 70–99)
Potassium: 3.1 mmol/L — ABNORMAL LOW (ref 3.5–5.1)
Sodium: 146 mmol/L — ABNORMAL HIGH (ref 135–145)

## 2019-12-21 LAB — MAGNESIUM: Magnesium: 2.3 mg/dL (ref 1.7–2.4)

## 2019-12-21 MED ORDER — SPIRONOLACTONE 12.5 MG HALF TABLET
12.5000 mg | ORAL_TABLET | Freq: Every day | ORAL | Status: DC
  Administered 2019-12-21 – 2019-12-22 (×2): 12.5 mg via ORAL
  Filled 2019-12-21 (×2): qty 1

## 2019-12-21 MED ORDER — METOPROLOL TARTRATE 12.5 MG HALF TABLET
12.5000 mg | ORAL_TABLET | Freq: Two times a day (BID) | ORAL | Status: DC
  Administered 2019-12-21 – 2019-12-22 (×3): 12.5 mg via ORAL
  Filled 2019-12-21 (×3): qty 1

## 2019-12-21 MED ORDER — POTASSIUM CHLORIDE CRYS ER 20 MEQ PO TBCR
20.0000 meq | EXTENDED_RELEASE_TABLET | Freq: Every day | ORAL | Status: DC
  Administered 2019-12-21: 20 meq via ORAL
  Filled 2019-12-21 (×2): qty 1

## 2019-12-21 MED ORDER — POTASSIUM CHLORIDE CRYS ER 20 MEQ PO TBCR
40.0000 meq | EXTENDED_RELEASE_TABLET | Freq: Once | ORAL | Status: AC
  Administered 2019-12-21: 40 meq via ORAL
  Filled 2019-12-21: qty 2

## 2019-12-21 NOTE — Progress Notes (Signed)
3 Days Post-Op   Subjective/Chief Complaint: Doing better Was up in chair for 2h BM x 2   Objective: Vital signs in last 24 hours: Temp:  [98.2 F (36.8 C)-98.7 F (37.1 C)] 98.2 F (36.8 C) (07/04 1150) Pulse Rate:  [88-115] 92 (07/04 1150) Resp:  [18-20] 18 (07/04 1150) BP: (93-122)/(49-75) 118/75 (07/04 0950) SpO2:  [96 %-100 %] 98 % (07/04 1150) Weight:  [82.6 kg] 82.6 kg (07/04 0635) Last BM Date: 12/17/19  Intake/Output from previous day: 07/03 0701 - 07/04 0700 In: 1183.2 [P.O.:240; I.V.:943.2] Out: 400 [Urine:400] Intake/Output this shift: No intake/output data recorded.  General appearance: cooperative Resp: clear to auscultation bilaterally Cardio: irregularly irregular rhythm GI: soft, less distended, incisions OK, +BS  Lab Results:  Recent Labs    12/20/19 0511 12/21/19 0249  WBC 8.6 6.6  HGB 8.9* 9.1*  HCT 29.8* 30.5*  PLT 190 169   BMET Recent Labs    12/20/19 0511 12/21/19 0249  NA 145 146*  K 3.4* 3.1*  CL 115* 115*  CO2 23 24  GLUCOSE 164* 111*  BUN 25* 24*  CREATININE 1.29* 1.14  CALCIUM 8.3* 8.3*   PT/INR No results for input(s): LABPROT, INR in the last 72 hours. ABG No results for input(s): PHART, HCO3 in the last 72 hours.  Invalid input(s): PCO2, PO2  Studies/Results: No results found.  Anti-infectives: Anti-infectives (From admission, onward)   Start     Dose/Rate Route Frequency Ordered Stop   12/18/19 1925  polymyxin B 500,000 Units, bacitracin 50,000 Units in sodium chloride 0.9 % 500 mL irrigation  Status:  Discontinued          As needed 12/18/19 2057 12/18/19 2057      Assessment/Plan: Recurrent incarcerated LIH with colonic obstruction - S/P laparoscopic assisted repair of recurrent incarcerated LIH with mesh 7/1 by Wilson/Dijon Kohlman, tolerating diet. BMx2. Continue PT/OT - seems he will progress to [facility]  Appreciate [facility] and Cardiology care - still on IV Amiodarone  I spoke with his wife and daughter at the  bedside.  Liz Malady 12/21/2019

## 2019-12-21 NOTE — Progress Notes (Signed)
PROGRESS NOTE    [name] [name]  LXB:[id number] DOB: December 11, 1940 DOA: 12/16/2019 PCP: Casper Harrison, [name], MD    Chief Complaint  Patient presents with  . Abdominal Pain    Brief Narrative:  79 year old male with prior h/o PAF on eliquis, CAD, chronic systolic HF, hypertension, hyperlipidemia, h/o AV replacement, AA s/p repair, incarcerated left inguinal hernia s/p repair on 12/02/2019, presents to ED with abd pain. He was found to have partial colonic obstruction. General surgery consulted. Cardiology consulted for atrial flutter and pre op clearance. Patient Underwent LAP  Repair of the recurrent inguinal hernia by Dr Andrey Campanile on 12/18/19. Post op pt able to pass flatus and he was started on clear liquid diet and advance as tolerated. PT evaluation recommending SNF. Family wants to take the patient home in am.  Pt seen and examined today. He had 2 BM, able to pass flatus and tolerated diet.     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 Patient currently is able to pass flatus, he was started on clear liquid diet and advanced to heart healthy diet. Able to tolerate. No nausea, vomiting or abdominal pain.  Ambulate as tolerated, pain control.    Persistent atrial flutter/ fibrillation:  Started on IV Amiodarone for rate control. Continue with IV amiodarone as per cardiology.  Cardiology on board and appreciate recommendations to continue with IV amiodarone for now. Start the patient on eliquis and oral metoprolol 12.5 mg daily, increased to BID today.    Chronic diastolic heart failure Patient does not appear to be in failure,.  D/c IV fluids.  Last echocardiogram showed Diffuse hypokinesis worse in the septum, apex and akinesis of inferior base . Left ventricular ejection fraction, by estimation, is 25 to 30%. The left ventricle has severely decreased function. The left ventricle demonstrates global hypokinesis. The left ventricular internal cavity size was mildly dilated. Left  ventricular diastolic parameters are indeterminate. Continue with strict intake and output and daily weights. He appears euvolemic.    AKI  probably from dehydration and poor oral intake.  Creatinine improved from 1.7 to 1.5 to 1.3 to 1.29 to 1.14. creatinine at baseline.  Avoid nephrotoxins at this time.  Baseline creatinine around 1.1   Anemia of chronic disease Baseline hemoglobin ranging from 8-10.  Currently at 9.1 Watch for blood loss anemia from surgery.   Essential hypertension;  Well controlled BP parameters.   Hypernatremia:  Free water deficit and dehydration.  Improved with dextrose fluids.  Encourage free water intake by mouth.     Hypokalemia:  Replaced, recheck in am.    DVT prophylaxis:scd's Code Status: FULL CODE.  Family Communication: family at bedside.  Disposition:   Status is: Inpatient  Remains inpatient appropriate because:IV treatments appropriate due to intensity of illness or inability to take PO   Dispo: The patient is from: Home              Anticipated d/c is to: Home              Anticipated d/c date is: 1 day              Patient currently is  not medically stable to d/c.       Consultants:   Cardiology  Surgery.    Procedures: lap repair of recurrent inguinal hernia with mesh scheduled for today.   Antimicrobials: Anti-infectives (From admission, onward)   Start     Dose/Rate Route Frequency Ordered Stop   12/18/19 1925  polymyxin B 500,000 Units, bacitracin 50,000 Units in sodium chloride 0.9 % 500 mL irrigation  Status:  Discontinued          As needed 12/18/19 2057  12/18/19 2057    .  Subjective: No nausea, vomiting, abdominal pain, no chest pain or sob,  Able to pass flatulence and 2 BM today.   Objective: Vitals:   12/20/19 1900 12/20/19 1946 12/20/19 2046 12/21/19 0635  BP:  114/67 122/74 (!) 119/49  Pulse: (!) 102 (!) 107 88   Resp:   20 20  Temp:   98.7 F (37.1 C) 98.7 F (37.1 C)  TempSrc:   Oral Oral  SpO2: 96% 98% 98%   Weight:    82.6 kg  Height:        Intake/Output Summary (Last 24 hours) at 12/21/2019 0754 Last data filed at 12/21/2019 1610 Gross per 24 hour  Intake 1183.22 ml  Output 400 ml  Net 783.22 ml   Filed Weights   12/19/19 0434 12/20/19 0400 12/21/19 0635  Weight: 81.1 kg 82.6 kg 82.6 kg    Examination:  General exam: alert and comfortable, not in distress.  respiratory system: Air entry fair bilateral, no wheezing or rhonchi Cardiovascular system: S1-S2 heard, irregularly irregular no JVD, no pedal edema Gastrointestinal system: Abdomen is soft, nondistended, bowel sounds normal Central nervous system: Alert and oriented to person and place, able to move all extremities and grossly nonfocal Extremities: No pedal edema Skin: No rashes seen Psychiatry: Mood isappropriate    Data Reviewed: I have personally reviewed following labs and imaging studies  CBC: Recent Labs  Lab 12/16/19 1841 12/18/19 0404 12/19/19 0314 12/20/19 0511 12/21/19 0249  WBC 9.9 8.5 7.9 8.6 6.6  HGB 11.8* 9.9* 9.9* 8.9* 9.1*  HCT 39.1 33.7* 33.0* 29.8* 30.5*  MCV 100.5* 102.7* 104.4* 101.7* 101.0*  PLT 369 262 234 190 169    Basic Metabolic Panel: Recent Labs  Lab 12/17/19 0429 12/18/19 0404 12/19/19 0314 12/20/19 0511 12/21/19 0249  NA 145 148* 150* 145 146*  K 4.6 4.0 3.9 3.4* 3.1*  CL 113* 117* 118* 115* 115*  CO2 24 19* 21* 23 24  GLUCOSE 90 76 97 164* 111*  BUN 38* 33* 24* 25* 24*  CREATININE 1.52* 1.56* 1.37* 1.29* 1.14  CALCIUM 8.6* 8.7* 8.4* 8.3* 8.3*  MG 2.4  --   --   --  2.3    GFR: Estimated  Creatinine Clearance: 54.2 mL/min (by C-G formula based on SCr of 1.14 mg/dL).  Liver Function Tests: Recent Labs  Lab 12/16/19 1841  AST 34  ALT 24  ALKPHOS 56  BILITOT 0.7  PROT 7.5  ALBUMIN 4.0    CBG: No results for input(s): GLUCAP in the last 168 hours.   Recent Results (from the past 240 hour(s))  SARS Coronavirus 2 by RT PCR (hospital order, performed in [facility] hospital lab) Nasopharyngeal Nasopharyngeal Swab     Status: None   Collection Time: 12/17/19  3:39 AM   Specimen: Nasopharyngeal Swab  Result Value Ref Range Status   SARS Coronavirus 2 NEGATIVE NEGATIVE Final    Comment: (NOTE) SARS-CoV-2 target nucleic acids are NOT  DETECTED.  The SARS-CoV-2 RNA is generally detectable in upper and lower respiratory specimens during the acute phase of infection. The lowest concentration of SARS-CoV-2 viral copies this assay can detect is 250 copies / mL. A negative result does not preclude SARS-CoV-2 infection and should not be used as the sole basis for treatment or other patient management decisions.  A negative result may occur with improper specimen collection / handling, submission of specimen other than nasopharyngeal swab, presence of viral mutation(s) within the areas targeted by this assay, and inadequate number of viral copies (<250 copies / mL). A negative result must be combined with clinical observations, patient history, and epidemiological information.  Fact Sheet for Patients:   BoilerBrush.com.cy  Fact Sheet for Healthcare Providers: https://pope.com/  This test is not yet approved or  cleared by the [place] FDA and has been authorized for detection and/or diagnosis of SARS-CoV-2 by FDA under an Emergency Use Authorization (EUA).  This EUA will remain in effect (meaning this test can be used) for the duration of the COVID-19 declaration under Section 564(b)(1) of the Act, 21 U.S.C. section  360bbb-3(b)(1), unless the authorization is terminated or revoked sooner.  Performed at [facility], [address], [place], [place] [address]          Radiology Studies: No results found.      Scheduled Meds: . apixaban  5 mg Oral BID  . Chlorhexidine Gluconate Cloth  6 each Topical Daily  . metoprolol tartrate  12.5 mg Oral BID  . potassium chloride  20 mEq Oral Daily  . potassium chloride  40 mEq Oral Once  . sodium chloride flush  3 mL Intravenous Once  . spironolactone  12.5 mg Oral Daily   Continuous Infusions: . amiodarone 30 mg/hr (12/20/19 2113)  . lactated ringers    . methocarbamol (ROBAXIN) IV       LOS: 4 days        Kathlen Mody, MD [facility]   To contact the attending provider between 7A-7P or the covering provider during after hours 7P-7A, please log into the web site [email or web address] and access using universal [place] password for that web site. If you do not have the password, please call the hospital operator.  12/21/2019, 7:54 AM

## 2019-12-21 NOTE — Progress Notes (Signed)
Progress Note  Patient Name: [name] [name] Date of Encounter: 12/21/2019  Primary Cardiologist: Lance Muss, MD  Subjective   Diet has been advanced since yesterday, no nausea or emesis.  No sense of chest pain or palpitations.  Inpatient Medications    Scheduled Meds: . apixaban  5 mg Oral BID  . Chlorhexidine Gluconate Cloth  6 each Topical Daily  . metoprolol tartrate  12.5 mg Oral Daily  . sodium chloride flush  3 mL Intravenous Once   Continuous Infusions: . amiodarone 30 mg/hr (12/20/19 2113)  . lactated ringers    . methocarbamol (ROBAXIN) IV     PRN Meds: fentaNYL (SUBLIMAZE) injection, methocarbamol (ROBAXIN) IV, metoprolol tartrate, phenol, prochlorperazine   Vital Signs    Vitals:   12/20/19 1900 12/20/19 1946 12/20/19 2046 12/21/19 0635  BP:  114/67 122/74 (!) 119/49  Pulse: (!) 102 (!) 107 88   Resp:   20 20  Temp:   98.7 F (37.1 C) 98.7 F (37.1 C)  TempSrc:   Oral Oral  SpO2: 96% 98% 98%   Weight:    82.6 kg  Height:        Intake/Output Summary (Last 24 hours) at 12/21/2019 0733 Last data filed at 12/21/2019 6073 Gross per 24 hour  Intake 1183.22 ml  Output 400 ml  Net 783.22 ml   Filed Weights   12/19/19 0434 12/20/19 0400 12/21/19 0635  Weight: 81.1 kg 82.6 kg 82.6 kg    Telemetry    Atrial fibrillation.  Personally reviewed.  ECG    ECG from 12/21/2019 shows atrial fibrillation at 101 bpm with IVCD and repolarization normalities.  Physical Exam   GEN:  Elderly male.  No acute distress.   Neck: No JVD. Cardiac:  Irregularly irregular without gallop.  Respiratory:  Clear to auscultation anteriorly, nonlabored. GI:  Bowel sounds present. MS:  SCDs in place.  Labs    Chemistry Recent Labs  Lab 12/16/19 1841 12/17/19 0429 12/19/19 0314 12/20/19 0511 12/21/19 0249  NA 144   < > 150* 145 146*  K 4.4   < > 3.9 3.4* 3.1*  CL 108   < > 118* 115* 115*  CO2 23   < > 21* 23 24  GLUCOSE 114*   < > 97 164* 111*  BUN 41*    < > 24* 25* 24*  CREATININE 1.72*   < > 1.37* 1.29* 1.14  CALCIUM 9.4   < > 8.4* 8.3* 8.3*  PROT 7.5  --   --   --   --   ALBUMIN 4.0  --   --   --   --   AST 34  --   --   --   --   ALT 24  --   --   --   --   ALKPHOS 56  --   --   --   --   BILITOT 0.7  --   --   --   --   GFRNONAA 37*   < > 48* 52* >60  GFRAA 43*   < > 56* >60 >60  ANIONGAP 13   < > 11 7 7    < > = values in this interval not displayed.     Hematology Recent Labs  Lab 12/19/19 0314 12/20/19 0511 12/21/19 0249  WBC 7.9 8.6 6.6  RBC 3.16* 2.93* 3.02*  HGB 9.9* 8.9* 9.1*  HCT 33.0* 29.8* 30.5*  MCV 104.4* 101.7* 101.0*  MCH 31.3 30.4  30.1  MCHC 30.0 29.9* 29.8*  RDW 19.4* 19.7* 19.4*  PLT 234 190 169    Cardiac Enzymes Recent Labs  Lab 12/16/19 2230 12/17/19 0013 12/17/19 0429  TROPONINIHS 17 20* 22*    BNP Recent Labs  Lab 12/17/19 0429  BNP 587.5*     Radiology    No results found.  Cardiac Studies   Echocardiogram 10/03/2019: 1. Diffuse hypokinesis worse in the septum, apex and akinesis of inferior  base . Left ventricular ejection fraction, by estimation, is 25 to 30%.  The left ventricle has severely decreased function. The left ventricle  demonstrates global hypokinesis. The  left ventricular internal cavity size was mildly dilated. Left  ventricular diastolic parameters are indeterminate.  2. Right ventricular systolic function is normal. The right ventricular  size is normal. There is normal pulmonary artery systolic pressure.  3. Left atrial size was moderately dilated.  4. Right atrial size was moderately dilated.  5. The mitral valve is degenerative. Mild mitral valve regurgitation.  6. By history bioprosthetic AVR with trivial central AR and acceptable  mean gradient of 7 mmHg . The aortic valve has been repaired/replaced.  Aortic valve regurgitation is trivial. No aortic stenosis is present.  There is a unknown a bioprosthetic valve  present in the aortic  position.  7. ? dilated descending thoracic aorta seen on PsL views.   Patient Profile     79 y.o. 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  Assessment & Plan    1.  Persistent atrial fibrillation.  CHA2DS2-VASc score is 5.  Eliquis was resumed yesterday and he is now on low-dose oral Lopressor.  Continues on IV amiodarone.  Heart rates 90s to low 100s.  2.  Ischemic cardiomyopathy with chronic systolic heart failure, LVEF 25 to 30%.  No active angina at this time.  Cardiac catheterization in April showed CTO of the RCA with collaterals, moderate LAD disease, patent stent in the proximal to mid circumflex, managed medically.  He was on Lasix with potassium supplement, losartan, Toprol-XL, and Aldactone as an outpatient.   3.  Incarcerated left inguinal hernia status post mesh repair on July 1.  Would continue IV amiodarone for now, increase Lopressor to 12.5 mg twice daily, continue Eliquis.  Still with intermittent, relative hypotension (mainly overnight), will have to go slow with medication titration.  Hold off on resuming Lasix for now.  Replete potassium and start back on low-dose Aldactone.  Signed, Nona Dell, MD  12/21/2019, 7:33 AM

## 2019-12-21 NOTE — TOC Progression Note (Signed)
Transition of Care [facility]) - Progression Note    Patient Details  Name: [name] [name] MRN: [id number] Date of Birth: 1940/08/16  Transition of Care [facility] CM/SW Contact  Patrice [place], [place] Phone Number: [phone] 12/21/2019, 2:14 PM  Clinical Narrative:    CSw spoke with patient's wife and daughter due to patient being with RN. Both explained that patient is doing better and is more alerted. They are declining SNF due to having family support to care for client at home. They shared that he has DME equipment at home. Spouse stated that patient has a Charity fundraiser with [facility] and would like to use them again. They are agreeable to [facility].  TOC team will continue to assist with discharge planning needs. Dispo plan has been changed to [facility].   Expected Discharge Plan: Home w Home Health Services Barriers to Discharge: Continued Medical Work up, English as a second language teacher  Expected Discharge Plan and Services Expected Discharge Plan: Home w Home Health Services       Living arrangements for the past 2 months: Mobile Home                                       Social Determinants of Health (SDOH) Interventions    Readmission Risk Interventions No flowsheet data found.

## 2019-12-22 ENCOUNTER — Emergency Department (HOSPITAL_COMMUNITY)
Admission: EM | Admit: 2019-12-22 | Discharge: 2019-12-22 | Disposition: A | Discharge status: Home / Self Care | Payer: Medicare Other | Attending: Emergency Medicine | Admitting: Emergency Medicine

## 2019-12-22 ENCOUNTER — Encounter (HOSPITAL_COMMUNITY): Payer: Self-pay

## 2019-12-22 ENCOUNTER — Other Ambulatory Visit: Payer: Self-pay

## 2019-12-22 DIAGNOSIS — I2510 Atherosclerotic heart disease of native coronary artery without angina pectoris: Secondary | ICD-10-CM | POA: Insufficient documentation

## 2019-12-22 DIAGNOSIS — I480 Paroxysmal atrial fibrillation: Secondary | ICD-10-CM

## 2019-12-22 DIAGNOSIS — I25118 Atherosclerotic heart disease of native coronary artery with other forms of angina pectoris: Secondary | ICD-10-CM

## 2019-12-22 DIAGNOSIS — Z87891 Personal history of nicotine dependence: Secondary | ICD-10-CM | POA: Insufficient documentation

## 2019-12-22 DIAGNOSIS — L7634 Postprocedural seroma of skin and subcutaneous tissue following other procedure: Secondary | ICD-10-CM | POA: Diagnosis not present

## 2019-12-22 DIAGNOSIS — S301XXA Contusion of abdominal wall, initial encounter: Secondary | ICD-10-CM

## 2019-12-22 DIAGNOSIS — I10 Essential (primary) hypertension: Secondary | ICD-10-CM | POA: Diagnosis not present

## 2019-12-22 DIAGNOSIS — K91872 Postprocedural seroma of a digestive system organ or structure following a digestive system procedure: Secondary | ICD-10-CM | POA: Diagnosis not present

## 2019-12-22 DIAGNOSIS — K4031 Unilateral inguinal hernia, with obstruction, without gangrene, recurrent: Principal | ICD-10-CM

## 2019-12-22 DIAGNOSIS — Z7901 Long term (current) use of anticoagulants: Secondary | ICD-10-CM | POA: Insufficient documentation

## 2019-12-22 DIAGNOSIS — I110 Hypertensive heart disease with heart failure: Secondary | ICD-10-CM | POA: Insufficient documentation

## 2019-12-22 DIAGNOSIS — I5032 Chronic diastolic (congestive) heart failure: Secondary | ICD-10-CM | POA: Insufficient documentation

## 2019-12-22 LAB — BASIC METABOLIC PANEL
Anion gap: 7 (ref 5–15)
Anion gap: 13 (ref 5–15)
BUN: 22 mg/dL (ref 8–23)
BUN: 21 mg/dL (ref 8–23)
CO2: 22 mmol/L (ref 22–32)
CO2: 21 mmol/L — ABNORMAL LOW (ref 22–32)
Calcium: 8.2 mg/dL — ABNORMAL LOW (ref 8.9–10.3)
Calcium: 8.7 mg/dL — ABNORMAL LOW (ref 8.9–10.3)
Chloride: 117 mmol/L — ABNORMAL HIGH (ref 98–111)
Chloride: 110 mmol/L (ref 98–111)
Creatinine, Ser: 0.97 mg/dL (ref 0.61–1.24)
Creatinine, Ser: 1.07 mg/dL (ref 0.61–1.24)
GFR calc Af Amer: >60 mL/min (ref >60)
GFR calc Af Amer: >60 mL/min (ref >60)
GFR calc non Af Amer: >60 mL/min (ref >60)
GFR calc non Af Amer: >60 mL/min (ref >60)
Glucose, Bld: 101 mg/dL — ABNORMAL HIGH (ref 70–99)
Glucose, Bld: 100 mg/dL — ABNORMAL HIGH (ref 70–99)
Potassium: 3.4 mmol/L — ABNORMAL LOW (ref 3.5–5.1)
Potassium: 3.6 mmol/L (ref 3.5–5.1)
Sodium: 146 mmol/L — ABNORMAL HIGH (ref 135–145)
Sodium: 144 mmol/L (ref 135–145)

## 2019-12-22 LAB — CBC
HCT: 36.9 % — ABNORMAL LOW (ref 39.0–52.0)
Hemoglobin: 11.1 g/dL — ABNORMAL LOW (ref 13.0–17.0)
MCH: 30.7 pg (ref 26.0–34.0)
MCHC: 30.1 g/dL (ref 30.0–36.0)
MCV: 102.2 fL — ABNORMAL HIGH (ref 80.0–100.0)
Platelets: 258 10*3/uL (ref 150–400)
RBC: 3.61 MIL/uL — ABNORMAL LOW (ref 4.22–5.81)
RDW: 19.4 % — ABNORMAL HIGH (ref 11.5–15.5)
WBC: 7 10*3/uL (ref 4.0–10.5)
nRBC: 0 % (ref 0.0–0.2)

## 2019-12-22 LAB — URINALYSIS, ROUTINE W REFLEX MICROSCOPIC
Bacteria, UA: NONE SEEN
Bilirubin Urine: NEGATIVE
Glucose, UA: NEGATIVE mg/dL
Ketones, ur: NEGATIVE mg/dL
Leukocytes,Ua: NEGATIVE
Nitrite: NEGATIVE
Protein, ur: NEGATIVE mg/dL
Specific Gravity, Urine: 1.009 (ref 1.005–1.030)
pH: 5 (ref 5.0–8.0)

## 2019-12-22 LAB — LACTIC ACID, PLASMA: Lactic Acid, Venous: 1.7 mmol/L (ref 0.5–1.9)

## 2019-12-22 MED ORDER — METOPROLOL TARTRATE 25 MG PO TABS
50.0000 mg | ORAL_TABLET | Freq: Two times a day (BID) | ORAL | 1 refills | Status: DC
Start: 2019-12-22 — End: 2019-12-23

## 2019-12-22 MED ORDER — FUROSEMIDE 40 MG PO TABS: 40.0000 mg | ORAL_TABLET | Freq: Every day | ORAL | 1 refills | Status: DC

## 2019-12-22 MED ORDER — METOPROLOL TARTRATE 25 MG PO TABS: 12.5000 mg | ORAL_TABLET | Freq: Two times a day (BID) | ORAL | 1 refills | Status: DC

## 2019-12-22 MED ORDER — POTASSIUM CHLORIDE CRYS ER 20 MEQ PO TBCR
40.0000 meq | EXTENDED_RELEASE_TABLET | Freq: Once | ORAL | Status: AC
  Administered 2019-12-22: 40 meq via ORAL
  Filled 2019-12-22: qty 2

## 2019-12-22 MED ORDER — AMIODARONE HCL 200 MG PO TABS
200.0000 mg | ORAL_TABLET | Freq: Every day | ORAL | Status: DC
  Administered 2019-12-22: 200 mg via ORAL
  Filled 2019-12-22: qty 1

## 2019-12-22 MED ORDER — POTASSIUM CHLORIDE CRYS ER 20 MEQ PO TBCR: 20.0000 meq | EXTENDED_RELEASE_TABLET | Freq: Every day | ORAL | Status: DC

## 2019-12-22 MED ORDER — FUROSEMIDE 40 MG PO TABS
40.0000 mg | ORAL_TABLET | Freq: Every day | ORAL | Status: DC
  Administered 2019-12-22: 40 mg via ORAL
  Filled 2019-12-22: qty 1

## 2019-12-22 MED ORDER — POTASSIUM CHLORIDE CRYS ER 20 MEQ PO TBCR: 20.0000 meq | EXTENDED_RELEASE_TABLET | Freq: Every day | ORAL | 0 refills | Status: DC

## 2019-12-22 MED ORDER — LOSARTAN POTASSIUM 25 MG PO TABS: 25.0000 mg | ORAL_TABLET | Freq: Every day | ORAL | 3 refills | Status: DC

## 2019-12-22 NOTE — ED Provider Notes (Signed)
[facility] EMERGENCY DEPARTMENT Provider Note   CSN: [id number] Arrival date & time: 12/22/19  1953     History Chief Complaint  Patient presents with  . Post-op Problem    [name] [name] is a 79 y.o. male.  HPI   Patient presented to the ED for evaluation of a postoperative problem.  Patient had recent surgery on July 1.  Patient had a left inguinal hernia repair performed.  This was a second surgical repair after recent one on June 15.  Patient has started to get up and walk around today.  Patient's family members noted that he was oozing so fluid that appeared to be thin blood from his left groin wound.  They called the surgeon's office and was instructed to apply pressure.  Family noted that it continued to ooze as she applied pressure.  It did not stop so EMS was contacted.  Past Medical History:  Diagnosis Date  . Aortic valve insufficiency   . Arthritis    "probably in my knees before they replaced them" (01/28/2015)  . Atrial flutter (HCC)   . CAD (coronary artery disease)   . CHF (congestive heart failure) (HCC)   . Descending aortic aneurysm (HCC)   . DVT (deep venous thrombosis) (HCC)    ?LLE post knee surgery  . Dyspnea 09/2019  . History of blood transfusion    "after one of my knee surgeries"  . HTN (hypertension)   . Hyperlipidemia   . Thoracic aneurysm   . Tobacco abuse     Patient Active Problem List   Diagnosis Date Noted  . 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 Hypotension 12/17/2019  . AKI (acute kidney injury) (HCC) 12/17/2019  . Incarcerated left inguinal hernia s/p repair 12/02/2019 12/01/2019  . Pressure injury of skin 10/06/2019  . Acute on chronic respiratory failure with hypoxia (HCC) 10/03/2019  . Multiple fractures of ribs, right side, init for clos fx 09/04/2019  . Chest pain 01/28/2015  . Near syncope 01/28/2015  . Chronic diastolic heart failure (HCC)  97/28/2060  . HCAP (healthcare-associated pneumonia) 10/11/2013  . Acute respiratory failure with hypoxia (HCC) 10/07/2013  . Shock circulatory (HCC) 10/07/2013  . UTI (urinary tract infection) 10/06/2013  . Recurrent UTI 10/06/2013  . Urinary retention 10/06/2013  . Insomnia 09/04/2013  . Bradycardia 08/23/2013  . Acute on chronic diastolic heart failure (HCC) 08/22/2013  . Atrial fibrillation (HCC) 05/21/2013  . Atrial flutter (HCC)   . Acute on chronic systolic heart failure (HCC) 05/11/2013  . Aortic atherosclerosis (HCC) 05/05/2013  . Aortic dissection (HCC) 05/04/2013  . Hypochromic anemia 05/04/2013  . CAD (coronary artery disease)   . S/P AVR (aortic valve replacement)   . Hyperlipidemia   . HTN (hypertension)     Past Surgical History:  Procedure Laterality Date  . AORTIC VALVE REPLACEMENT  02/19/2009  . AORTO-FEMORAL BYPASS GRAFT  03/2010   ascending aortic and arch aneurysm repair/notes 04/09/2009  . CARDIAC VALVE REPLACEMENT    . CAROTID-SUBCLAVIAN BYPASS GRAFT Left 08/26/2013   Procedure: BYPASS GRAFT CAROTID-SUBCLAVIAN;  Surgeon: Nada Libman, MD;  Location: [facility] OR;  Service: Vascular;  Laterality: Left;  . CATARACT EXTRACTION W/ INTRAOCULAR LENS  IMPLANT, BILATERAL Bilateral   . ENDOVASCULAR STENT INSERTION N/A 08/26/2013   Procedure:  THORACIC STENT GRAFT INSERTION;  Surgeon: Nada Libman, MD;  Location: [facility] OR;  Service: Vascular;  Laterality: N/A;  . EYE SURGERY    .  INGUINAL HERNIA REPAIR Left 12/02/2019   Procedure: REPAIR LEFT INGUINAL HERNIA WITH MESH;  Surgeon: Violeta Gelinas, MD;  Location: [facility] OR;  Service: General;  Laterality: Left;  . INGUINAL HERNIA REPAIR Left 12/18/2019   Procedure: LAPAROSCOPIC ASSISTED REPAIR OF RECURRENT INCARCERATED LEFT INGUINAL HERNIA WITH MESH; LAPAOSCOPIC REPAIR OF SEROSAL TEAR;  Surgeon: Gaynelle Adu, MD;  Location: [facility] OR;  Service: General;  Laterality: Left;  . INSERTION OF MESH Left 12/02/2019   Procedure: INSERTION OF  MESH;  Surgeon: Violeta Gelinas, MD;  Location: [facility] OR;  Service: General;  Laterality: Left;  . JOINT REPLACEMENT    . KNEE ARTHROSCOPY Bilateral   . REPLACEMENT TOTAL KNEE Bilateral   . right groin lymphocele Right 03/29/2009  . RIGHT/LEFT HEART CATH AND CORONARY ANGIOGRAPHY N/A 10/06/2019   Procedure: RIGHT/LEFT HEART CATH AND CORONARY ANGIOGRAPHY;  Surgeon: Lyn Records, MD;  Location: [facility] INVASIVE CV LAB;  Service: Cardiovascular;  Laterality: N/A;  . THORACENTESIS  2010 X 2  . TONSILLECTOMY         Family History  Problem Relation Age of Onset  . Celiac disease Daughter   . Aortic aneurysm Father   . Hypertension Mother   . Heart attack Neg Hx   . Stroke Neg Hx     Social History   Tobacco Use  . Smoking status: Former Smoker    Packs/day: 0.00    Types: Cigarettes    Quit date: 04/16/1974    Years since quitting: 45.7  . Smokeless tobacco: Never Used  Vaping Use  . Vaping Use: Never used  Substance Use Topics  . Alcohol use: No  . Drug use: No    Home Medications Prior to Admission medications   Medication Sig Start Date End Date Taking? Authorizing Provider  acetaminophen (TYLENOL) 500 MG tablet Take 2 tablets (1,000 mg total) by mouth every 6 (six) hours as needed. Patient taking differently: Take 1,000 mg by mouth every 6 (six) hours as needed for mild pain or moderate pain.  09/08/19  Yes Barnetta Chapel, PA-C  amiodarone (PACERONE) 200 MG tablet Take 1 tablet (200 mg total) by mouth daily. 05/29/19  Yes [name], MD  apixaban (ELIQUIS) 5 MG TABS tablet Take 1 tablet (5 mg total) by mouth 2 (two) times daily. 05/29/19  Yes [name], MD  atorvastatin (LIPITOR) 10 MG tablet Take 1 tablet by mouth once daily Patient taking differently: Take 10 mg by mouth daily.  06/16/19  Yes [name], MD  ferrous sulfate 325 (65 FE) MG tablet Take 1 tablet (325 mg total) by mouth daily with breakfast. 10/16/19  Yes [name], MD    furosemide (LASIX) 40 MG tablet Take 1 tablet (40 mg total) by mouth daily. 12/22/19  Yes Kathlen Mody, MD  lansoprazole (PREVACID) 30 MG capsule Take 30 mg by mouth daily. 08/13/19  Yes [provider]  loratadine (CLARITIN) 10 MG tablet Take 1 tablet (10 mg total) by mouth daily. 06/01/14  Yes [name], MD  Multiple Vitamin (MULTIVITAMIN WITH MINERALS) TABS tablet Take 1 tablet by mouth daily.   Yes [provider]  nitroGLYCERIN (NITROSTAT) 0.4 MG SL tablet Place 1 tablet (0.4 mg total) under the tongue every 5 (five) minutes as needed for chest pain (X3 DOSES BEFORE CALLING 911). 05/29/19  Yes [name], MD  potassium chloride SA (KLOR-CON) 20 MEQ tablet Take 1 tablet (20 mEq total) by mouth daily. 12/23/19  Yes Kathlen Mody, MD  spironolactone (ALDACTONE) 25 MG tablet Take 0.5 tablets (12.5 mg total) by mouth daily. 10/16/19  Yes [name], MD  traMADol (ULTRAM) 50 MG tablet Take 1 tablet (50 mg total) by mouth every 6 (six) hours as needed for severe pain. 12/03/19  Yes Violeta Gelinas, MD  losartan (COZAAR) 25 MG tablet Take 1 tablet (25 mg total) by mouth daily. 12/24/19   Kathlen Mody, MD  metoprolol tartrate (LOPRESSOR) 25 MG tablet Take 2 tablets (50 mg total) by mouth 2 (two) times daily. 12/22/19 02/20/20  Kathlen Mody, MD    Allergies    Lortab [hydrocodone-acetaminophen], Morphine and related, and Other  Review of Systems   Review of Systems  All other systems reviewed and are negative.   Physical Exam Updated Vital Signs BP 123/82 (BP Location: Right Arm)   Pulse (!) 115   Temp 98.8 F (37.1 C) (Oral)   Resp 16   SpO2 98%   Physical Exam Vitals and nursing note reviewed.  Constitutional:      Appearance: He is well-developed. He is not toxic-appearing or diaphoretic.  HENT:     Head: Normocephalic and atraumatic.     Right Ear: External ear normal.     Left Ear: External ear normal.  Eyes:     General: No scleral  icterus.       Right eye: No discharge.        Left eye: No discharge.     Conjunctiva/sclera: Conjunctivae normal.  Neck:     Trachea: No tracheal deviation.  Cardiovascular:     Rate and Rhythm: Normal rate. Rhythm irregular.  Pulmonary:     Effort: Pulmonary effort is normal. No respiratory distress.     Breath sounds: Normal breath sounds. No stridor. No wheezing or rales.  Abdominal:     General: Bowel sounds are normal. There is no distension.     Palpations: Abdomen is soft.     Tenderness: There is no abdominal tenderness. There is no guarding or rebound.     Comments: Hematoma noted around the surgical wound, mild superficial wound dehiscence, slight serosanguineous drainage but no evidence of active bleeding  Musculoskeletal:        General: No tenderness.     Cervical back: Neck supple.  Skin:    General: Skin is warm and dry.     Findings: No rash.  Neurological:     Mental Status: He is alert.     Cranial Nerves: No cranial nerve deficit (no facial droop, extraocular movements intact, no slurred speech).     Sensory: No sensory deficit.     Motor: No abnormal muscle tone or seizure activity.     Coordination: Coordination normal.       ED Results / Procedures / Treatments   Labs (all labs ordered are listed, but only abnormal results are displayed) Labs Reviewed  CBC - Abnormal; Notable for the following components:      Result Value   RBC 3.61 (*)    Hemoglobin 11.1 (*)    HCT 36.9 (*)    MCV 102.2 (*)    RDW 19.4 (*)    All other components within normal limits  BASIC METABOLIC PANEL - Abnormal; Notable for the following components:   CO2 21 (*)    Glucose, Bld 100 (*)    Calcium 8.7 (*)    All other components within normal limits  URINALYSIS, ROUTINE W REFLEX MICROSCOPIC - Abnormal; Notable for the following components:   Color,  Urine STRAW (*)    Hgb urine dipstick SMALL (*)    All other components within normal limits  LACTIC ACID, PLASMA     EKG EKG Interpretation  Date/Time:  Monday December 22 2019 20:02:00 EDT Ventricular Rate:  111 PR Interval:    QRS Duration: 138 QT Interval:  414 QTC Calculation: 563 R Axis:   87 Text Interpretation: Atrial flutter with variable A-V block Left ventricular hypertrophy with QRS widening and repolarization abnormality ( Cornell product ) Abnormal ECG prolonged qt No significant change since last tracing Confirmed by Linwood Dibbles [phone]) on 12/22/2019 9:29:07 PM   Radiology No results found.  Procedures Procedures (including critical care time)  Medications Ordered in ED Medications - No data to display  ED Course  I have reviewed the triage vital signs and the nursing notes.  Pertinent labs & imaging results that were available during my care of the patient were reviewed by me and considered in my medical decision making (see chart for details).  Clinical Course as of Dec 21 2244  Mon Dec 22, 2019  2209 Labs reviewed.  Hemoglobin is stable compared to previous.  Electrolyte panel is unremarkable.  Urinalysis without signs of infection   [JK]    Clinical Course User Index [JK] Linwood Dibbles, MD   MDM Rules/Calculators/A&P                          Patient presented for possible wound dehiscence and postoperative bleeding.  My exam the patient does not have any significant bleeding.  There was some serosanguineous drainage.  I suspect he has a seroma associated with his recent surgery.  There is a slight dehiscence of the superficial layer of the skin.  There is some serosanguineous drainage from this area.  Patient's laboratory tests are unremarkable.  Applied a dressing.  Discussed the findings with the patient and his family.  Patient appears stable for outpatient follow-up.  Final Clinical Impression(s) / ED Diagnoses Final diagnoses:  Abdominal wall seroma, initial encounter    Rx / DC Orders ED Discharge Orders    None       Linwood Dibbles, MD 12/22/19 2246

## 2019-12-22 NOTE — Discharge Instructions (Addendum)
The blood tests were reassuring.  The drainage seems to be consistent with a seroma.  This is likely related to a fluid collection associated with the surgery.  This should resolve over time.  Follow-up with his surgeon this week to be rechecked

## 2019-12-22 NOTE — Progress Notes (Signed)
Progress Note  Patient Name: [name] [name] Date of Encounter: 12/22/2019  [facility] Cardiologist: Lance Muss, MD   Subjective   Feels better.  Wants to go home.  Eating sold foods.    Inpatient Medications    Scheduled Meds: . amiodarone  200 mg Oral Daily  . apixaban  5 mg Oral BID  . Chlorhexidine Gluconate Cloth  6 each Topical Daily  . metoprolol tartrate  12.5 mg Oral BID  . [START ON 12/23/2019] potassium chloride  20 mEq Oral Daily  . potassium chloride  40 mEq Oral Once  . sodium chloride flush  3 mL Intravenous Once  . spironolactone  12.5 mg Oral Daily   Continuous Infusions: . amiodarone 30 mg/hr (12/21/19 2239)  . lactated ringers    . methocarbamol (ROBAXIN) IV     PRN Meds: fentaNYL (SUBLIMAZE) injection, methocarbamol (ROBAXIN) IV, metoprolol tartrate, phenol, prochlorperazine   Vital Signs    Vitals:   12/22/19 0007 12/22/19 0525 12/22/19 0553 12/22/19 0707  BP:    140/76  Pulse:    97  Resp: 18 18  16   Temp: 98.3 F (36.8 C) (!) 97.5 F (36.4 C)  98.3 F (36.8 C)  TempSrc: Oral Oral  Oral  SpO2: 98%   98%  Weight:   83.7 kg   Height:        Intake/Output Summary (Last 24 hours) at 12/22/2019 0859 Last data filed at 12/22/2019 0720 Gross per 24 hour  Intake 1274.76 ml  Output 400 ml  Net 874.76 ml   Last 3 Weights 12/22/2019 12/21/2019 12/20/2019  Weight (lbs) 184 lb 8.4 oz 182 lb 1.6 oz 182 lb 1.6 oz  Weight (kg) 83.7 kg 82.6 kg 82.6 kg      Telemetry    atrial flutter, borderline rate control - Personally Reviewed  ECG    Atrial flutter - Personally Reviewed  Physical Exam   GEN: No acute distress.  NG tube has been removed since I last saw him Neck: No JVD Cardiac: irregualrly irregular, no murmurs, rubs, or gallops.  Respiratory: Clear to auscultation bilaterally. GI:  non-distended - improved form prior MS: No edema; No deformity. Neuro:  Nonfocal  Psych: Normal affect   Labs    High Sensitivity Troponin:     Recent Labs  Lab 12/16/19 2230 12/17/19 0013 12/17/19 0429  TROPONINIHS 17 20* 22*      Chemistry Recent Labs  Lab 12/16/19 1841 12/17/19 0429 12/20/19 0511 12/21/19 0249 12/22/19 0346  NA 144   < > 145 146* 146*  K 4.4   < > 3.4* 3.1* 3.4*  CL 108   < > 115* 115* 117*  CO2 23   < > 23 24 22   GLUCOSE 114*   < > 164* 111* 101*  BUN 41*   < > 25* 24* 22  CREATININE 1.72*   < > 1.29* 1.14 0.97  CALCIUM 9.4   < > 8.3* 8.3* 8.2*  PROT 7.5  --   --   --   --   ALBUMIN 4.0  --   --   --   --   AST 34  --   --   --   --   ALT 24  --   --   --   --   ALKPHOS 56  --   --   --   --   BILITOT 0.7  --   --   --   --  GFRNONAA 37*   < > 52* >60 >60  GFRAA 43*   < > >60 >60 >60  ANIONGAP 13   < > 7 7 7    < > = values in this interval not displayed.     Hematology Recent Labs  Lab 12/19/19 0314 12/20/19 0511 12/21/19 0249  WBC 7.9 8.6 6.6  RBC 3.16* 2.93* 3.02*  HGB 9.9* 8.9* 9.1*  HCT 33.0* 29.8* 30.5*  MCV 104.4* 101.7* 101.0*  MCH 31.3 30.4 30.1  MCHC 30.0 29.9* 29.8*  RDW 19.4* 19.7* 19.4*  PLT 234 190 169    BNP Recent Labs  Lab 12/17/19 0429  BNP 587.5*     DDimer No results for input(s): DDIMER in the last 168 hours.   Radiology    No results found.  Cardiac Studies   Low EF by prior echo  Patient Profile     79 y.o. male Chronic systolic heart failure, CAD, atrial flutter  Assessment & Plan    Atrial flutter:  Change IV amio to PO.  If he remains in flutter with difficult to control rates, may need to consider DCCV in several weeks.  Would not do now as he has been off of anticoagulation.  Eliquis has been restarted. Could consider DCC in 3-4 weeks.  Chronic systolic heart failure: Appears euvolemic, but has decreased PO intake as he recovers from surgery.  Spironolactone restarted at low dose.  WOuld have him take Lasix 40 mg daily with potassium 96 daily; both of which are half of his usual home dose.  If he has increased edema or signs of  volume overload, he can go back to his usual home dose of Lasix 40 mg BID, KCl 40 mEq.  His wife has a medical background and is very involved in his care at home, so she will be a good resource to help judge fluid status.   CAD: no angina.  OK to discharge from cardiac standpoint.  I called the wife's number but there was no answer.  F/u appt already scheduled for the end of July.      For questions or updates, please contact [facility] Please consult [email or web address] for contact info under        Signed, August, MD  12/22/2019, 8:59 AM

## 2019-12-22 NOTE — Care Management Important Message (Signed)
Important Message  Patient Details  Name: [name] [name] MRN: [id number] Date of Birth: December 02, 1940   Medicare Important Message Given:  Yes     Renie Ora 12/22/2019, 12:16 PM

## 2019-12-22 NOTE — Progress Notes (Signed)
4 Days Post-Op   Subjective/Chief Complaint: Had another BM overnight. Eating breakfast. Wants to go home.   Objective: Vital signs in last 24 hours: Temp:  [97.5 F (36.4 C)-98.4 F (36.9 C)] 98.3 F (36.8 C) (07/05 0707) Pulse Rate:  [92-115] 97 (07/05 0707) Resp:  [16-18] 16 (07/05 0707) BP: (93-140)/(63-76) 140/76 (07/05 0707) SpO2:  [98 %] 98 % (07/05 0707) Weight:  [83.7 kg] 83.7 kg (07/05 0553) Last BM Date: 12/21/19  Intake/Output from previous day: 07/04 0701 - 07/05 0700 In: 1274.8 [P.O.:480; I.V.:794.8] Out: -  Intake/Output this shift: Total I/O In: -  Out: 400 [Urine:400]  General appearance: cooperative Resp: few rales GI: soft, less distended, NT, incisions OK Male genitalia: L inguinal incision OK  Lab Results:  Recent Labs    12/20/19 0511 12/21/19 0249  WBC 8.6 6.6  HGB 8.9* 9.1*  HCT 29.8* 30.5*  PLT 190 169   BMET Recent Labs    12/21/19 0249 12/22/19 0346  NA 146* 146*  K 3.1* 3.4*  CL 115* 117*  CO2 24 22  GLUCOSE 111* 101*  BUN 24* 22  CREATININE 1.14 0.97  CALCIUM 8.3* 8.2*   PT/INR No results for input(s): LABPROT, INR in the last 72 hours. ABG No results for input(s): PHART, HCO3 in the last 72 hours.  Invalid input(s): PCO2, PO2  Studies/Results: No results found.  Anti-infectives: Anti-infectives (From admission, onward)   Start     Dose/Rate Route Frequency Ordered Stop   12/18/19 1925  polymyxin B 500,000 Units, bacitracin 50,000 Units in sodium chloride 0.9 % 500 mL irrigation  Status:  Discontinued          As needed 12/18/19 2057 12/18/19 2057      Assessment/Plan: Recurrent incarcerated LIH with colonic obstruction - S/P laparoscopic assisted repair of recurrent incarcerated LIH with mesh 7/1 by Wilson/Aalina Brege, mobilizing better and he is OK for discharge from my standpoint once medically ready. Note Cardiology plan to transition off IV Amio today. F/U listed in D/C navigator.  LOS: 5 days    Liz Malady 12/22/2019

## 2019-12-22 NOTE — ED Notes (Signed)
Pt daughter, [name], would like an update once he's roomed. [phone].

## 2019-12-22 NOTE — ED Triage Notes (Signed)
Pt bib [facility] for post-op problem. Pt received hernia surgery thurs and discharged today. Pt brought back d/t the incision opening while walking today. Pt denies pain. Pt tachycardic w/ EMS, bleeding controlled.

## 2019-12-22 NOTE — Care Management (Signed)
1528 12-22-19 Patient previously declined SNF. Patient wanted to return home- Per CSW note patient was active with [facility]- Case Manager was able to speak with patient's daughter and services were completed. Daughter will call [facility] in am to make them aware that patient has discharged. Case Manager will fax orders to agency- due to agency was closed due to holiday. Per daughter [facility] will accept the patient- Case Manager will verify upon return on Wednesday. Case Manager did ask daughter to call back if the agency was unable to accept. Daughter did not want to use any other agency at this time. No further needs at this time. Gala Lewandowsky, RN,BSN Case Manager

## 2019-12-22 NOTE — Progress Notes (Signed)
PT Cancellation Note  Patient Details Name: [name] [name] MRN: [id number] DOB: September 29, 1940   Cancelled Treatment:    Reason Eval/Treat Not Completed: Other (comment). Per pt and family, pt much improved since eval and for home in a few minutes. Report they have all needed equipment at home and have no questions.    [name] [facility] 12/22/2019, 2:46 PM Skip Mayer PT Acute Rehabilitation Services Pager [phone] Office [phone]

## 2019-12-23 ENCOUNTER — Telehealth: Payer: Self-pay

## 2019-12-23 MED ORDER — METOPROLOL SUCCINATE ER 100 MG PO TB24
100.0000 mg | ORAL_TABLET | Freq: Every day | ORAL | 3 refills | Status: DC
Start: 2019-12-23 — End: 2020-07-22

## 2019-12-23 NOTE — Telephone Encounter (Signed)
-----   Message from [name], MD sent at 12/23/2019  8:14 AM EDT ----- [place]ville Haggard,  Can you call Mr. Schoenherr and tell him it is ok to increase back to Toprol XL 100 mg based on his HR and BP from the ER yesterday night. COntinue to monitor.   THanks.  JV

## 2019-12-23 NOTE — Telephone Encounter (Signed)
Called and spoke to patient and his wife and made them aware to switch to Toprol-XL 100 mg QD per [name] based on HR and BP in the ER.

## 2019-12-26 DIAGNOSIS — Z9181 History of falling: Secondary | ICD-10-CM | POA: Diagnosis not present

## 2019-12-26 DIAGNOSIS — Z955 Presence of coronary angioplasty implant and graft: Secondary | ICD-10-CM | POA: Diagnosis not present

## 2019-12-26 DIAGNOSIS — S2241XD Multiple fractures of ribs, right side, subsequent encounter for fracture with routine healing: Secondary | ICD-10-CM | POA: Diagnosis not present

## 2019-12-26 DIAGNOSIS — W19XXXD Unspecified fall, subsequent encounter: Secondary | ICD-10-CM | POA: Diagnosis not present

## 2019-12-26 DIAGNOSIS — N2889 Other specified disorders of kidney and ureter: Secondary | ICD-10-CM | POA: Diagnosis not present

## 2019-12-26 DIAGNOSIS — E785 Hyperlipidemia, unspecified: Secondary | ICD-10-CM | POA: Diagnosis not present

## 2019-12-26 DIAGNOSIS — K4031 Unilateral inguinal hernia, with obstruction, without gangrene, recurrent: Secondary | ICD-10-CM | POA: Diagnosis not present

## 2019-12-26 DIAGNOSIS — I4892 Unspecified atrial flutter: Secondary | ICD-10-CM | POA: Diagnosis not present

## 2019-12-26 DIAGNOSIS — R2689 Other abnormalities of gait and mobility: Secondary | ICD-10-CM | POA: Diagnosis not present

## 2019-12-26 DIAGNOSIS — J9621 Acute and chronic respiratory failure with hypoxia: Secondary | ICD-10-CM | POA: Diagnosis not present

## 2019-12-26 DIAGNOSIS — I480 Paroxysmal atrial fibrillation: Secondary | ICD-10-CM | POA: Diagnosis not present

## 2019-12-26 DIAGNOSIS — I110 Hypertensive heart disease with heart failure: Secondary | ICD-10-CM | POA: Diagnosis not present

## 2019-12-26 DIAGNOSIS — I712 Thoracic aortic aneurysm, without rupture: Secondary | ICD-10-CM | POA: Diagnosis not present

## 2019-12-26 DIAGNOSIS — Z86718 Personal history of other venous thrombosis and embolism: Secondary | ICD-10-CM | POA: Diagnosis not present

## 2019-12-26 DIAGNOSIS — Z48815 Encounter for surgical aftercare following surgery on the digestive system: Secondary | ICD-10-CM | POA: Diagnosis not present

## 2019-12-26 DIAGNOSIS — Z79891 Long term (current) use of opiate analgesic: Secondary | ICD-10-CM | POA: Diagnosis not present

## 2019-12-26 DIAGNOSIS — Z7902 Long term (current) use of antithrombotics/antiplatelets: Secondary | ICD-10-CM | POA: Diagnosis not present

## 2019-12-26 DIAGNOSIS — I2510 Atherosclerotic heart disease of native coronary artery without angina pectoris: Secondary | ICD-10-CM | POA: Diagnosis not present

## 2019-12-26 DIAGNOSIS — Z95828 Presence of other vascular implants and grafts: Secondary | ICD-10-CM | POA: Diagnosis not present

## 2019-12-26 DIAGNOSIS — L89151 Pressure ulcer of sacral region, stage 1: Secondary | ICD-10-CM | POA: Diagnosis not present

## 2019-12-26 DIAGNOSIS — I272 Pulmonary hypertension, unspecified: Secondary | ICD-10-CM | POA: Diagnosis not present

## 2019-12-26 DIAGNOSIS — R2681 Unsteadiness on feet: Secondary | ICD-10-CM | POA: Diagnosis not present

## 2019-12-26 DIAGNOSIS — I5043 Acute on chronic combined systolic (congestive) and diastolic (congestive) heart failure: Secondary | ICD-10-CM | POA: Diagnosis not present

## 2019-12-26 DIAGNOSIS — Z79899 Other long term (current) drug therapy: Secondary | ICD-10-CM | POA: Diagnosis not present

## 2019-12-26 DIAGNOSIS — D509 Iron deficiency anemia, unspecified: Secondary | ICD-10-CM | POA: Diagnosis not present

## 2019-12-29 NOTE — Discharge Summary (Signed)
Physician Discharge Summary  [name] [name] XNA:[id number] DOB: 05/24/1941 DOA: 12/16/2019  PCP: Bobbye Morton, MD  Admit date: 12/16/2019 Discharge date: 12/22/2019  Admitted From: Home.  Disposition:  Home   Recommendations for Outpatient Follow-up:  1. Follow up with PCP in 1-2 weeks 2. Please obtain BMP/CBC in one week Please follow up with cardiology as recommended.   Discharge Condition: stable.  CODE STATUS: Full code.  Diet recommendation: Heart Healthy  Brief/Interim Summary: 79 year old male with prior h/o PAF on eliquis, CAD, chronic systolic HF, hypertension, hyperlipidemia, h/o AV replacement, AA s/p repair, incarcerated left inguinal hernia s/p repair on 12/02/2019, presents to ED with abd pain. He was found to have partial colonic obstruction. General surgery consulted. Cardiology consulted for atrial flutter and pre op clearance. Patient Underwent LAP Repair of the recurrent inguinal hernia by Dr Andrey Campanile on 12/18/19. Post op pt able to pass flatus and he was started on clear liquid diet and advance as tolerated. PT evaluation recommending SNF. Family wants to take the patient home in am.  Pt seen and examined today. He had 2 BM, able to pass flatus and tolerated diet.   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 Patient currently is able to pass flatus, he was started on clear liquid diet and advanced to heart healthy diet. Able to tolerate. No nausea, vomiting or abdominal pain.  Ambulate as tolerated, pain  control.    Persistent atrial flutter/ fibrillation:  Started on IV Amiodarone for rate control. Continue with IV amiodarone as per cardiology.  Cardiology on board and appreciate recommendations to continue with IV amiodarone for now. Start the patient on eliquis and metoprolol 50 mg BID on discharge.    Chronic diastolic heart failure Patient does not appear to be in failure,.  Last echocardiogram showed Diffuse hypokinesis worse in the septum, apex and akinesis of inferior base . Left ventricular ejection fraction, by estimation, is 25 to 30%. The left ventricle has severely decreased function. The left ventricle demonstrates global hypokinesis. The left ventricular internal cavity size was mildly dilated. Left  ventricular diastolic parameters are indeterminate. Continue with strict intake and output and daily weights. He appears euvolemic.    AKI  probably from dehydration and poor oral intake.  Creatinine improved from 1.7 to 1.5 to 1.3 to 1.29 to 1.14. creatinine at baseline.  Avoid nephrotoxins at this time.  Baseline creatinine around 1.1   Anemia of chronic disease Baseline hemoglobin ranging from 8-10.  Currently at 9.1 Watch for blood loss anemia from surgery.   Essential hypertension;  Well controlled BP parameters.   Hypernatremia:  Free water deficit and dehydration.  Improved with dextrose fluids.  Encourage free water intake by mouth.     Hypokalemia:  Replaced,.  Discharge Instructions  Discharge Instructions    Diet - low sodium heart healthy   Complete by: As directed    Discharge instructions   Complete by: As directed    Please follow up with PCP in one week and follow up with Cardiology as scheduled.   Discharge wound care:   Complete by:  As directed    As per gen surgery     Allergies as of 12/22/2019      Reactions   Lortab [hydrocodone-acetaminophen] Hives   No trouble breathing   Morphine And Related Hives   Other  Other (See Comments)   Staples from surgery caused infection      Medication List    STOP taking these medications   methocarbamol 500 MG tablet Commonly known as: ROBAXIN   metoprolol succinate 100 MG 24 hr tablet Commonly known as: TOPROL-XL     TAKE these medications   acetaminophen 500 MG tablet Commonly known as: TYLENOL Take 2 tablets (1,000 mg total) by mouth every 6 (six) hours as needed. What changed: reasons to take this   amiodarone 200 MG tablet Commonly known as: PACERONE Take 1 tablet (200 mg total) by mouth daily.   apixaban 5 MG Tabs tablet Commonly known as: Eliquis Take 1 tablet (5 mg total) by mouth 2 (two) times daily.   atorvastatin 10 MG tablet Commonly known as: LIPITOR Take 1 tablet by mouth once daily   ferrous sulfate 325 (65 FE) MG tablet Take 1 tablet (325 mg total) by mouth daily with breakfast.   furosemide 40 MG tablet Commonly known as: LASIX Take 1 tablet (40 mg total) by mouth daily. What changed: when to take this   lansoprazole 30 MG capsule Commonly known as: PREVACID Take 30 mg by mouth daily.   loratadine 10 MG tablet Commonly known as: CLARITIN Take 1 tablet (10 mg total) by mouth daily.   losartan 25 MG tablet Commonly known as: COZAAR Take 1 tablet (25 mg total) by mouth daily.   multivitamin with minerals Tabs tablet Take 1 tablet by mouth daily.   nitroGLYCERIN 0.4 MG SL tablet Commonly known as: NITROSTAT Place 1 tablet (0.4 mg total) under the tongue every 5 (five) minutes as needed for chest pain (X3 DOSES BEFORE CALLING 911).   potassium chloride SA 20 MEQ tablet Commonly known as: KLOR-CON Take 1 tablet (20 mEq total) by mouth daily. What changed: how much to take   spironolactone 25 MG tablet Commonly known as: ALDACTONE Take 0.5 tablets (12.5 mg total) by mouth daily.   traMADol 50 MG tablet Commonly known as: ULTRAM Take 1 tablet (50 mg total) by mouth every 6 (six) hours as needed for severe  pain.            Discharge Care Instructions  (From admission, onward)         Start     Ordered   12/22/19 0000  Discharge wound care:       Comments: As per gen surgery   12/22/19 1410          Follow-up Information    Violeta Gelinas, MD. Schedule an appointment as soon as possible for a visit in 2 week(s).   Specialty: General Surgery Contact information: [address] [place] [address] [phone]        Street, [name], MD. Schedule an appointment as soon as possible for a visit in 1 week(s).   Specialty: Family Medicine Contact information: [address] [place] [address] [phone]        [name], MD .   Specialties: Cardiology, Radiology, Interventional Cardiology Contact information: [address] [place] [address] [phone]              Allergies  Allergen Reactions  . Lortab [Hydrocodone-Acetaminophen] Hives  No trouble breathing  . Morphine And Related Hives  . Other Other (See Comments)    Staples from surgery caused infection    Consultations: Cardiology.  Surgery.   Procedures/Studies: CT ABDOMEN PELVIS W CONTRAST  Result Date: 12/17/2019 CLINICAL DATA:  Abdominal distension. Diarrhea. Vomiting. Recent hernia surgery. EXAM: CT ABDOMEN AND PELVIS WITH CONTRAST TECHNIQUE: Multidetector CT imaging of the abdomen and pelvis was performed using the standard protocol following bolus administration of intravenous contrast. CONTRAST:  OMNIPAQUE IOHEXOL 300 MG/ML  SOLN COMPARISON:  Most recent CT 09/04/2019 FINDINGS: Lower chest: Descending thoracic aortic stent graft is partially included. Stable dimensions of the distal thoracic aorta. Multi chamber cardiomegaly. Mitral annulus calcifications. Coronary artery calcifications. No pleural effusion or acute basilar airspace disease. Hepatobiliary: No focal hepatic lesion. Layering sludge and stones in the gallbladder  without pericholecystic inflammation or biliary dilatation. Pancreas: Parenchymal atrophy. No ductal dilatation or inflammation. Spleen: Normal in size without focal abnormality. Adrenals/Urinary Tract: Normal adrenal glands. No hydronephrosis or perinephric edema. Homogeneous renal enhancement with symmetric excretion on delayed phase imaging. There are small bilateral renal cysts. Urinary bladder is physiologically distended without wall thickening. Mass effect on the bladder base from enlarged prostate gland. Stomach/Bowel: Recurrent left inguinal hernia which currently contains short segment of sigmoid colon. Colon proximal to the hernia is diffusely and prominently dilated and fluid-filled with air-fluid levels. Minimal stranding in the hernia sac with small amount of free fluid. There is no definite colonic wall thickening. More distal sigmoid colon is less distended with formed stool. Colonic diverticulosis without focal diverticulitis. The cecum is located in the right mid abdomen, containing fluid and dependent density, possibly ingested contents or contrast. There is no significant small bowel dilatation. There is no pneumatosis. Enteric contrast within the distal esophagus with moderate hiatal hernia. Stomach is not abnormally distended. Vascular/Lymphatic: Aortic atherosclerosis. 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 Moderate bi-iliac atherosclerosis. Patent portal vein. Patent mesenteric vessels. No portal venous gas. No bulky abdominopelvic adenopathy. Reproductive: Enlarged prostate gland spans 5 cm AP with nodular mass effect on the bladder base. Other: Left inguinal hernia, apparently recurrent given history of recent hernia repair. Hernia contains short segment of sigmoid colon. There is fluid in the dependent aspect of the hernia sac. Mild mesenteric and soft tissue stranding in the hernia. No evidence of free air. Small fat containing right inguinal hernia. Small fat containing  umbilical hernia. Musculoskeletal: Scoliosis and multilevel degenerative change in the spine. There are no acute or suspicious osseous abnormalities. IMPRESSION: 1. Recurrent left inguinal hernia which currently contains short segment of sigmoid colon. Colon proximal to the hernia is diffusely dilated and fluid-filled with air-fluid levels, suspicious for partial colonic obstruction. Minimal stranding in the hernia sac with small amount of free fluid. No definite colonic wall thickening or evidence of ischemia. 2. Floppy cecum currently located in the right mid abdomen. 3. Colonic diverticulosis without focal diverticulitis. 4. Enlarged prostate gland with nodular mass effect on the bladder base. 5. Moderate hiatal hernia. Enteric contrast within the distal esophagus, can be seen with vomiting, reflux or delayed transit. 6. Gallstones without pericholecystic inflammation. Aortic Atherosclerosis (ICD10-I70.0). Electronically Signed   By: Narda Rutherford M.D.   On: 12/17/2019 02:10   DG CHEST PORT 1 VIEW  Result Date: 12/17/2019 CLINICAL DATA:  Rales EXAM: PORTABLE CHEST 1 VIEW COMPARISON:  Fifteen days ago FINDINGS: Cardiomegaly. Tortuous aorta with stent grafting. Peripheral density at the left base which is likely fat based on CT  earlier today. No edema, consolidation, or pneumothorax. IMPRESSION: No active disease.  No pulmonary edema. Electronically Signed   By: Marnee Spring M.D.   On: 12/17/2019 04:36   DG Chest Port 1 View  Result Date: 12/02/2019 CLINICAL DATA:  Pleural effusions. EXAM: PORTABLE CHEST 1 VIEW COMPARISON:  Chest x-ray 4/16/1. FINDINGS: Heart is enlarged. Aortic stent graft noted. CABG. No edema is present. Previously noted pleural effusions have resolved. AC joint arthropathy is noted. IMPRESSION: 1. Cardiomegaly without failure. 2. Interval resolution of bilateral pleural effusions. Electronically Signed   By: Marin Roberts M.D.   On: 12/02/2019 08:40   DG Abd Portable 1  View  Result Date: 12/17/2019 CLINICAL DATA:  Post NG tube placement EXAM: PORTABLE ABDOMEN - 1 VIEW COMPARISON:  Chest x-ray 12/17/2019 FINDINGS: Status post endovascular repair of the thoracic aorta and median sternotomy incidentally noted. Gastric tube in the stomach, tip in the fundus. Side port below GE junction. Distended loops of bowel are partially visualized in the upper abdomen. Abdominal plain film excludes portions of the LEFT flank and pelvis. Visualized skeletal structures without acute process. IMPRESSION: 1. Gastric tube in the stomach. 2. Distended loops of bowel are partially visualized in the upper abdomen. Abdominal plain film excludes portions of the LEFT flank and pelvis. Electronically Signed   By: Donzetta Kohut M.D.   On: 12/17/2019 07:57       Subjective: No new complaints.   Discharge Exam: Vitals:   12/22/19 0707 12/22/19 1235  BP: 140/76 (!) 142/87  Pulse: 97 91  Resp: 16 20  Temp: 98.3 F (36.8 C) 97.7 F (36.5 C)  SpO2: 98% 99%   Vitals:   12/22/19 0525 12/22/19 0553 12/22/19 0707 12/22/19 1235  BP:   140/76 (!) 142/87  Pulse:   97 91  Resp: Temp: (!) 97.5 F (36.4 C)  98.3 F (36.8 C) 97.7 F (36.5 C)  TempSrc: Oral  Oral Oral  SpO2:   98% 99%  Weight:  83.7 kg    Height:        General: Pt is alert, awake, not in acute distress Cardiovascular: RRR, S1/S2 +, no rubs, no gallops Respiratory: CTA bilaterally, no wheezing, no rhonchi Abdominal: Soft, NT, ND, bowel sounds + Extremities: no edema, no cyanosis    The results of significant diagnostics from this hospitalization (including imaging, microbiology, ancillary and laboratory) are listed below for reference.     Microbiology: No results found for this or any previous visit (from the past 240 hour(s)).   Labs: BNP (last 3 results) Recent Labs    10/03/19 1048 12/17/19 0429  BNP 1,334.6* 587.5*   Basic Metabolic Panel: Recent Labs  Lab 12/22/19 2022  NA 144  K  3.6  CL 110  CO2 21*  GLUCOSE 100*  BUN 21  CREATININE 1.07  CALCIUM 8.7*   Liver Function Tests: No results for input(s): AST, ALT, ALKPHOS, BILITOT, PROT, ALBUMIN in the last 168 hours. No results for input(s): LIPASE, AMYLASE in the last 168 hours. No results for input(s): AMMONIA in the last 168 hours. CBC: Recent Labs  Lab 12/22/19 2022  WBC 7.0  HGB 11.1*  HCT 36.9*  MCV 102.2*  PLT 258   Cardiac Enzymes: No results for input(s): CKTOTAL, CKMB, CKMBINDEX, TROPONINI in the last 168 hours. BNP: Invalid input(s): POCBNP CBG: No results for input(s): GLUCAP in the last 168 hours. D-Dimer No results for input(s): DDIMER in the last 72 hours. Hgb A1c  No results for input(s): HGBA1C in the last 72 hours. Lipid Profile No results for input(s): CHOL, HDL, LDLCALC, TRIG, CHOLHDL, LDLDIRECT in the last 72 hours. Thyroid function studies No results for input(s): TSH, T4TOTAL, T3FREE, THYROIDAB in the last 72 hours.  Invalid input(s): FREET3 Anemia work up No results for input(s): VITAMINB12, FOLATE, FERRITIN, TIBC, IRON, RETICCTPCT in the last 72 hours. Urinalysis    Component Value Date/Time   COLORURINE STRAW (A) 12/22/2019 2020   APPEARANCEUR CLEAR 12/22/2019 2020   LABSPEC 1.009 12/22/2019 2020   PHURINE 5.0 12/22/2019 2020   GLUCOSEU NEGATIVE 12/22/2019 2020   HGBUR SMALL (A) 12/22/2019 2020   BILIRUBINUR NEGATIVE 12/22/2019 2020   KETONESUR NEGATIVE 12/22/2019 2020   PROTEINUR NEGATIVE 12/22/2019 2020   UROBILINOGEN 1.0 10/06/2013 0000   NITRITE NEGATIVE 12/22/2019 2020   LEUKOCYTESUR NEGATIVE 12/22/2019 2020   Sepsis Labs Invalid input(s): PROCALCITONIN,  WBC,  LACTICIDVEN Microbiology No results found for this or any previous visit (from the past 240 hour(s)).   Time coordinating discharge:35 minutes.  SIGNED:   Kathlen Mody, MD  [facility]

## 2020-01-01 DIAGNOSIS — I480 Paroxysmal atrial fibrillation: Secondary | ICD-10-CM | POA: Diagnosis not present

## 2020-01-01 DIAGNOSIS — I4892 Unspecified atrial flutter: Secondary | ICD-10-CM | POA: Diagnosis not present

## 2020-01-01 DIAGNOSIS — I5043 Acute on chronic combined systolic (congestive) and diastolic (congestive) heart failure: Secondary | ICD-10-CM | POA: Diagnosis not present

## 2020-01-01 DIAGNOSIS — I110 Hypertensive heart disease with heart failure: Secondary | ICD-10-CM | POA: Diagnosis not present

## 2020-01-01 DIAGNOSIS — Z48815 Encounter for surgical aftercare following surgery on the digestive system: Secondary | ICD-10-CM | POA: Diagnosis not present

## 2020-01-01 DIAGNOSIS — J9621 Acute and chronic respiratory failure with hypoxia: Secondary | ICD-10-CM | POA: Diagnosis not present

## 2020-01-02 DIAGNOSIS — I110 Hypertensive heart disease with heart failure: Secondary | ICD-10-CM | POA: Diagnosis not present

## 2020-01-02 DIAGNOSIS — Z48815 Encounter for surgical aftercare following surgery on the digestive system: Secondary | ICD-10-CM | POA: Diagnosis not present

## 2020-01-02 DIAGNOSIS — I4892 Unspecified atrial flutter: Secondary | ICD-10-CM | POA: Diagnosis not present

## 2020-01-02 DIAGNOSIS — J9621 Acute and chronic respiratory failure with hypoxia: Secondary | ICD-10-CM | POA: Diagnosis not present

## 2020-01-02 DIAGNOSIS — I480 Paroxysmal atrial fibrillation: Secondary | ICD-10-CM | POA: Diagnosis not present

## 2020-01-02 DIAGNOSIS — I5043 Acute on chronic combined systolic (congestive) and diastolic (congestive) heart failure: Secondary | ICD-10-CM | POA: Diagnosis not present

## 2020-01-07 DIAGNOSIS — Z48815 Encounter for surgical aftercare following surgery on the digestive system: Secondary | ICD-10-CM | POA: Diagnosis not present

## 2020-01-07 DIAGNOSIS — J9621 Acute and chronic respiratory failure with hypoxia: Secondary | ICD-10-CM | POA: Diagnosis not present

## 2020-01-07 DIAGNOSIS — I4892 Unspecified atrial flutter: Secondary | ICD-10-CM | POA: Diagnosis not present

## 2020-01-07 DIAGNOSIS — I480 Paroxysmal atrial fibrillation: Secondary | ICD-10-CM | POA: Diagnosis not present

## 2020-01-07 DIAGNOSIS — I110 Hypertensive heart disease with heart failure: Secondary | ICD-10-CM | POA: Diagnosis not present

## 2020-01-07 DIAGNOSIS — I5043 Acute on chronic combined systolic (congestive) and diastolic (congestive) heart failure: Secondary | ICD-10-CM | POA: Diagnosis not present

## 2020-01-08 ENCOUNTER — Other Ambulatory Visit: Payer: Medicare Other

## 2020-01-08 ENCOUNTER — Ambulatory Visit: Payer: Medicare Other | Admitting: Cardiothoracic Surgery

## 2020-01-08 DIAGNOSIS — Z48815 Encounter for surgical aftercare following surgery on the digestive system: Secondary | ICD-10-CM | POA: Diagnosis not present

## 2020-01-08 DIAGNOSIS — J9621 Acute and chronic respiratory failure with hypoxia: Secondary | ICD-10-CM | POA: Diagnosis not present

## 2020-01-08 DIAGNOSIS — I5043 Acute on chronic combined systolic (congestive) and diastolic (congestive) heart failure: Secondary | ICD-10-CM | POA: Diagnosis not present

## 2020-01-08 DIAGNOSIS — I110 Hypertensive heart disease with heart failure: Secondary | ICD-10-CM | POA: Diagnosis not present

## 2020-01-08 DIAGNOSIS — I4892 Unspecified atrial flutter: Secondary | ICD-10-CM | POA: Diagnosis not present

## 2020-01-08 DIAGNOSIS — I480 Paroxysmal atrial fibrillation: Secondary | ICD-10-CM | POA: Diagnosis not present

## 2020-01-14 DIAGNOSIS — I5043 Acute on chronic combined systolic (congestive) and diastolic (congestive) heart failure: Secondary | ICD-10-CM | POA: Diagnosis not present

## 2020-01-14 DIAGNOSIS — I480 Paroxysmal atrial fibrillation: Secondary | ICD-10-CM | POA: Diagnosis not present

## 2020-01-14 DIAGNOSIS — J9621 Acute and chronic respiratory failure with hypoxia: Secondary | ICD-10-CM | POA: Diagnosis not present

## 2020-01-14 DIAGNOSIS — I110 Hypertensive heart disease with heart failure: Secondary | ICD-10-CM | POA: Diagnosis not present

## 2020-01-14 DIAGNOSIS — Z48815 Encounter for surgical aftercare following surgery on the digestive system: Secondary | ICD-10-CM | POA: Diagnosis not present

## 2020-01-14 DIAGNOSIS — I4892 Unspecified atrial flutter: Secondary | ICD-10-CM | POA: Diagnosis not present

## 2020-01-14 NOTE — Progress Notes (Signed)
Cardiology Office Note   Date:  01/15/2020   ID:  Pacer, Wehr Apr 12, 1941, MRN [id number]  PCP:  Street, [name], MD    No chief complaint on file.  AFib  Wt Readings from Last 3 Encounters:  01/15/20 170 lb 3.2 oz (77.2 kg)  12/22/19 184 lb 8.4 oz (83.7 kg)  12/03/19 172 lb 6.4 oz (78.2 kg)       History of Present Illness: [name] [name] is a 79 y.o. male  with h/o aortic disection, CAD - DES to circumflex inSept2011. 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 He was admitted to the hospital on 10/05/13 for urosepsis and was on the ventilator for 2 days. Also has thoracentesis for pleural effusion (1200cc).   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 He had a retinal vein thrombosis apparently, per the patient's report.  In 4/21, he was admitted with heart failure. Echo showed: "Left ventricular ejection fraction, by estimation, is 25 to 30%.  The left ventricle has severely decreased function. The left ventricle  demonstrates global hypokinesis."    Cath showed: "Chronic total occlusion of the mid right coronary, collateralized right to right and left to right.  Widely patent left main.  Widely patent LAD with moderate luminal irregularities proximal to distal.  Widely patent stent in the proximal to mid circumflex with up to 50% narrowing distal to the stent.  Moderate pulmonary hypertension. Mean pulmonary artery pressure 35 mmHg.  Mean pulmonary capillary wedge pressure 32 mmHg.  RECOMMENDATIONS:  Left ventricular systolic dysfunction is not on the basis of coronary disease. 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   More recently, history includes: " incarcerated left inguinal hernia s/p repair on 12/02/2019, presents to ED with abd  pain. He was found to have partial colonic obstruction. General surgery consulted. Cardiology consulted for atrial flutter and pre op clearance. Patient Underwent LAP Repair of the recurrent inguinal hernia by Dr Andrey Campanile on 12/18/19."  IV Amio was used for rate controlled while he was NPO.   He had a seroma that was draining.  It is better.    HH checking HR and it has been normal.  Has a pressure sore area  On his heel.    Past Medical History:  Diagnosis Date   Acute on chronic diastolic heart failure (HCC) 08/22/2013   Acute on chronic respiratory failure with hypoxia (HCC) 10/03/2019   Acute on chronic systolic heart failure (HCC) 05/11/2013   EF from 35% to 50-55% on 6/14 ECHO.    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 No evidence of dissection or endoleak.  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 No evidence of abdominal aortic dissection.  Occluded proximal left subclav   Aortic valve insufficiency    Arthritis    "probably in my knees before they replaced them" (01/28/2015)   Atrial fibrillation (HCC) 05/21/2013   Atrial flutter (HCC)    Bradycardia 08/23/2013   CAD (  coronary artery disease)    Chest pain 01/28/2015   CHF (congestive heart failure) (HCC)    Chronic diastolic heart failure (HCC) 01/28/2015   Descending aortic aneurysm (HCC)    DVT (deep venous thrombosis) (HCC)    ?LLE post knee surgery   Dyspnea 09/2019   HCAP (healthcare-associated pneumonia) 10/11/2013   History of blood transfusion    "after one of my knee surgeries"   HTN (hypertension)    Hyperlipidemia     Hypotension 12/17/2019   Incarcerated left inguinal hernia s/p repair 12/02/2019 12/01/2019   Insomnia 09/04/2013   Multiple fractures of ribs, right side, init for clos fx 09/04/2019   Near syncope 01/28/2015   Pressure injury of skin 10/06/2019   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 12/18/2019 12/17/2019   Recurrent UTI 10/06/2013   Shock circulatory (HCC) 10/07/2013   Thoracic aneurysm    Tobacco abuse    Urinary retention 10/06/2013   UTI (urinary tract infection) 10/06/2013    Past Surgical History:  Procedure Laterality Date   AORTIC VALVE REPLACEMENT  02/19/2009   AORTO-FEMORAL BYPASS GRAFT  03/2010   ascending aortic and arch aneurysm repair/notes 04/09/2009   CARDIAC VALVE REPLACEMENT     CAROTID-SUBCLAVIAN BYPASS GRAFT Left 08/26/2013   Procedure: BYPASS GRAFT CAROTID-SUBCLAVIAN;  Surgeon: Nada Libman, MD;  Location: [facility] OR;  Service: Vascular;  Laterality: Left;   CATARACT EXTRACTION W/ INTRAOCULAR LENS  IMPLANT, BILATERAL Bilateral    ENDOVASCULAR STENT INSERTION N/A 08/26/2013   Procedure:  THORACIC STENT GRAFT INSERTION;  Surgeon: Nada Libman, MD;  Location: [facility] OR;  Service: Vascular;  Laterality: N/A;   EYE SURGERY     INGUINAL HERNIA REPAIR Left 12/02/2019   Procedure: REPAIR LEFT INGUINAL HERNIA WITH MESH;  Surgeon: Violeta Gelinas, MD;  Location: [facility] OR;  Service: General;  Laterality: Left;   INGUINAL HERNIA REPAIR Left 12/18/2019   Procedure: LAPAROSCOPIC ASSISTED REPAIR OF RECURRENT INCARCERATED LEFT INGUINAL HERNIA WITH MESH; LAPAOSCOPIC REPAIR OF SEROSAL TEAR;  Surgeon: Gaynelle Adu, MD;  Location: [facility] OR;  Service: General;  Laterality: Left;   INSERTION OF MESH Left 12/02/2019   Procedure: INSERTION OF MESH;  Surgeon: Violeta Gelinas, MD;  Location: [facility] OR;  Service: General;  Laterality: Left;   JOINT REPLACEMENT     KNEE ARTHROSCOPY Bilateral    REPLACEMENT TOTAL KNEE Bilateral    right groin lymphocele  Right 03/29/2009   RIGHT/LEFT HEART CATH AND CORONARY ANGIOGRAPHY N/A 10/06/2019   Procedure: RIGHT/LEFT HEART CATH AND CORONARY ANGIOGRAPHY;  Surgeon: Lyn Records, MD;  Location: [facility] INVASIVE CV LAB;  Service: Cardiovascular;  Laterality: N/A;   THORACENTESIS  2010 X 2   TONSILLECTOMY       Current Outpatient Medications  Medication Sig Dispense Refill   acetaminophen (TYLENOL) 500 MG tablet Take 2 tablets (1,000 mg total) by mouth every 6 (six) hours as needed. (Patient taking differently: Take 1,000 mg by mouth every 6 (six) hours as needed for mild pain or moderate pain. ) 30 tablet 0   amiodarone (PACERONE) 200 MG tablet Take 1 tablet (200 mg total) by mouth daily. 30 tablet 11   apixaban (ELIQUIS) 5 MG TABS tablet Take 1 tablet (5 mg total) by mouth 2 (two) times daily. 180 tablet 1   atorvastatin (LIPITOR) 10 MG tablet Take 1 tablet by mouth once daily (Patient taking differently: Take 10 mg by mouth daily. ) 90 tablet 3   ferrous sulfate 325 (65 FE) MG tablet  Take 1 tablet (325 mg total) by mouth daily with breakfast. 90 tablet 3   furosemide (LASIX) 40 MG tablet Take 1 tablet (40 mg total) by mouth daily. 30 tablet 1   lansoprazole (PREVACID) 30 MG capsule Take 30 mg by mouth daily.     loratadine (CLARITIN) 10 MG tablet Take 1 tablet (10 mg total) by mouth daily. 30 tablet 11   losartan (COZAAR) 25 MG tablet Take 1 tablet (25 mg total) by mouth daily. 90 tablet 3   metoprolol succinate (TOPROL-XL) 100 MG 24 hr tablet Take 1 tablet (100 mg total) by mouth daily. Take with or immediately following a meal. 90 tablet 3   Multiple Vitamin (MULTIVITAMIN WITH MINERALS) TABS tablet Take 1 tablet by mouth daily.     nitroGLYCERIN (NITROSTAT) 0.4 MG SL tablet Place 1 tablet (0.4 mg total) under the tongue every 5 (five) minutes as needed for chest pain (X3 DOSES BEFORE CALLING 911). 25 tablet 3   potassium chloride SA (KLOR-CON) 20 MEQ tablet Take 1 tablet (20 mEq total) by  mouth daily. 30 tablet 0   spironolactone (ALDACTONE) 25 MG tablet Take 0.5 tablets (12.5 mg total) by mouth daily. 45 tablet 3   traMADol (ULTRAM) 50 MG tablet Take 1 tablet (50 mg total) by mouth every 6 (six) hours as needed for severe pain. 30 tablet 0   No current facility-administered medications for this visit.    Allergies:   Lortab [hydrocodone-acetaminophen], Morphine and related, and Other    Social History:  The patient  reports that he quit smoking about 45 years ago. His smoking use included cigarettes. He smoked 0.00 packs per day. He has never used smokeless tobacco. He reports that he does not drink alcohol and does not use drugs.   Family History:  The patient's family history includes Aortic aneurysm in his father; Celiac disease in his daughter; Hypertension in his mother.    ROS:  Please see the history of present illness.   Otherwise, review of systems are positive for decreasing drainage from abdominal wound.   All other systems are reviewed and negative.    PHYSICAL EXAM: VS:  BP (!) 120/60    Pulse 91    Ht 5' 10.5" (1.791 m)    Wt 170 lb 3.2 oz (77.2 kg)    SpO2 93%    BMI 24.08 kg/m  , BMI Body mass index is 24.08 kg/m. GEN: Well nourished, well developed, in no acute distress  HEENT: normal  Neck: no JVD, carotid bruits, or masses Cardiac: RRR; 2/6 systolic murmur, no rubs, or gallops,no edema  Respiratory:  clear to auscultation bilaterally, normal work of breathing GI: soft, nontender, nondistended, + BS MS: no deformity or atrophy  Skin: warm and dry, no rash Neuro:  Strength and sensation are intact Psych: euthymic mood, full affect   EKG:   The ekg ordered 7/5 demonstrates atrial flutter with variable block   Recent Labs: 10/04/2019: TSH 1.342 12/16/2019: ALT 24 12/17/2019: B Natriuretic Peptide 587.5 12/21/2019: Magnesium 2.3 12/22/2019: BUN 21; Creatinine, Ser 1.07; Hemoglobin 11.1; Platelets 258; Potassium 3.6; Sodium 144   Lipid Panel      Component Value Date/Time   CHOL 152 10/10/2013 1220   TRIG 116 10/07/2013 0320   HDL 38 (L) 07/29/2010 0210   CHOLHDL 3.6 07/29/2010 0210   VLDL 18 07/29/2010 0210   LDLCALC  07/29/2010 0210    80        Total Cholesterol/HDL:CHD Risk Coronary Heart Disease  Risk Table                     Men   Women  1/2 Average Risk   3.4   3.3  Average Risk       5.0   4.4  2 X Average Risk   9.6   7.1  3 X Average Risk  23.4   11.0        Use the calculated Patient Ratio above and the CHD Risk Table to determine the patient's CHD Risk.        ATP III CLASSIFICATION (LDL):  <100     mg/dL   Optimal  517-616  mg/dL   Near or Above                    Optimal  130-159  mg/dL   Borderline  073-710  mg/dL   High  >626     mg/dL   Very High     Other studies Reviewed: Additional studies/ records that were reviewed today with results demonstrating: Hbg 11.1 at last chcek in July 2021.   ASSESSMENT AND PLAN:  1.   Chronic systolic heart failure: Appears euvolemic.  Continue daily lasix.  2.   CAD: No angina. COntinue aggressive secondary prevention.  Appetite is better now.  3.   Atrial flutter: Regular rate now.  Much better controlled.  4.   S/p AVR: SBE prophylaxis.     Current medicines are reviewed at length with the patient today.  The patient concerns regarding his medicines were addressed.  The following changes have been made:  No change  Labs/ tests ordered today include:  No orders of the defined types were placed in this encounter.   Recommend 150 minutes/week of aerobic exercise Low fat, low carb, high fiber diet recommended  Disposition:   FU in 6 months   Signed, Lance Muss, MD  01/15/2020 1:55 PM    [facility] [address] [place], [place], [place]  [address] Phone: [phone]; Fax: [phone]

## 2020-01-15 ENCOUNTER — Ambulatory Visit (INDEPENDENT_AMBULATORY_CARE_PROVIDER_SITE_OTHER): Payer: Medicare Other | Admitting: Interventional Cardiology

## 2020-01-15 ENCOUNTER — Encounter: Payer: Self-pay | Admitting: Interventional Cardiology

## 2020-01-15 ENCOUNTER — Other Ambulatory Visit: Payer: Self-pay

## 2020-01-15 VITALS — BP 120/60 | HR 91 | Ht 70.5 in | Wt 170.2 lb

## 2020-01-15 DIAGNOSIS — I5021 Acute systolic (congestive) heart failure: Secondary | ICD-10-CM | POA: Diagnosis not present

## 2020-01-15 DIAGNOSIS — Z952 Presence of prosthetic heart valve: Secondary | ICD-10-CM

## 2020-01-15 DIAGNOSIS — I484 Atypical atrial flutter: Secondary | ICD-10-CM | POA: Diagnosis not present

## 2020-01-15 DIAGNOSIS — I2510 Atherosclerotic heart disease of native coronary artery without angina pectoris: Secondary | ICD-10-CM

## 2020-01-15 NOTE — Patient Instructions (Signed)

## 2020-01-21 DIAGNOSIS — I5043 Acute on chronic combined systolic (congestive) and diastolic (congestive) heart failure: Secondary | ICD-10-CM | POA: Diagnosis not present

## 2020-01-21 DIAGNOSIS — J9621 Acute and chronic respiratory failure with hypoxia: Secondary | ICD-10-CM | POA: Diagnosis not present

## 2020-01-21 DIAGNOSIS — Z48815 Encounter for surgical aftercare following surgery on the digestive system: Secondary | ICD-10-CM | POA: Diagnosis not present

## 2020-01-21 DIAGNOSIS — I4892 Unspecified atrial flutter: Secondary | ICD-10-CM | POA: Diagnosis not present

## 2020-01-21 DIAGNOSIS — I480 Paroxysmal atrial fibrillation: Secondary | ICD-10-CM | POA: Diagnosis not present

## 2020-01-21 DIAGNOSIS — I110 Hypertensive heart disease with heart failure: Secondary | ICD-10-CM | POA: Diagnosis not present

## 2020-01-25 ENCOUNTER — Other Ambulatory Visit: Payer: Self-pay | Admitting: Interventional Cardiology

## 2020-01-25 DIAGNOSIS — I4819 Other persistent atrial fibrillation: Secondary | ICD-10-CM | POA: Diagnosis not present

## 2020-01-25 DIAGNOSIS — Z87891 Personal history of nicotine dependence: Secondary | ICD-10-CM | POA: Diagnosis not present

## 2020-01-25 DIAGNOSIS — I5042 Chronic combined systolic (congestive) and diastolic (congestive) heart failure: Secondary | ICD-10-CM | POA: Diagnosis not present

## 2020-01-25 DIAGNOSIS — Z7901 Long term (current) use of anticoagulants: Secondary | ICD-10-CM | POA: Diagnosis not present

## 2020-01-25 DIAGNOSIS — E785 Hyperlipidemia, unspecified: Secondary | ICD-10-CM | POA: Diagnosis not present

## 2020-01-25 DIAGNOSIS — Z48815 Encounter for surgical aftercare following surgery on the digestive system: Secondary | ICD-10-CM | POA: Diagnosis not present

## 2020-01-25 DIAGNOSIS — L89610 Pressure ulcer of right heel, unstageable: Secondary | ICD-10-CM | POA: Diagnosis not present

## 2020-01-25 DIAGNOSIS — Z952 Presence of prosthetic heart valve: Secondary | ICD-10-CM | POA: Diagnosis not present

## 2020-01-25 DIAGNOSIS — I2510 Atherosclerotic heart disease of native coronary artery without angina pectoris: Secondary | ICD-10-CM | POA: Diagnosis not present

## 2020-01-25 DIAGNOSIS — I110 Hypertensive heart disease with heart failure: Secondary | ICD-10-CM | POA: Diagnosis not present

## 2020-01-26 DIAGNOSIS — H3582 Retinal ischemia: Secondary | ICD-10-CM | POA: Diagnosis not present

## 2020-01-26 DIAGNOSIS — H43813 Vitreous degeneration, bilateral: Secondary | ICD-10-CM | POA: Diagnosis not present

## 2020-01-26 DIAGNOSIS — H35033 Hypertensive retinopathy, bilateral: Secondary | ICD-10-CM | POA: Diagnosis not present

## 2020-01-26 DIAGNOSIS — H34811 Central retinal vein occlusion, right eye, with macular edema: Secondary | ICD-10-CM | POA: Diagnosis not present

## 2020-01-26 NOTE — Telephone Encounter (Signed)
Eliquis 5mg  refill request received. Patient is 79 years old, weight-77.2kg, Crea-1.07 on 12/22/2019, Diagnosis-Afib, and last seen by Dr. 02/22/2020 on 01/15/2020. Dose is appropriate based on dosing criteria. Will send in refill to requested pharmacy.

## 2020-01-29 DIAGNOSIS — I110 Hypertensive heart disease with heart failure: Secondary | ICD-10-CM | POA: Diagnosis not present

## 2020-01-29 DIAGNOSIS — I2510 Atherosclerotic heart disease of native coronary artery without angina pectoris: Secondary | ICD-10-CM | POA: Diagnosis not present

## 2020-01-29 DIAGNOSIS — L89610 Pressure ulcer of right heel, unstageable: Secondary | ICD-10-CM | POA: Diagnosis not present

## 2020-01-29 DIAGNOSIS — I5042 Chronic combined systolic (congestive) and diastolic (congestive) heart failure: Secondary | ICD-10-CM | POA: Diagnosis not present

## 2020-01-29 DIAGNOSIS — I4819 Other persistent atrial fibrillation: Secondary | ICD-10-CM | POA: Diagnosis not present

## 2020-01-29 DIAGNOSIS — Z48815 Encounter for surgical aftercare following surgery on the digestive system: Secondary | ICD-10-CM | POA: Diagnosis not present

## 2020-02-05 DIAGNOSIS — I2510 Atherosclerotic heart disease of native coronary artery without angina pectoris: Secondary | ICD-10-CM | POA: Diagnosis not present

## 2020-02-05 DIAGNOSIS — I5042 Chronic combined systolic (congestive) and diastolic (congestive) heart failure: Secondary | ICD-10-CM | POA: Diagnosis not present

## 2020-02-05 DIAGNOSIS — Z48815 Encounter for surgical aftercare following surgery on the digestive system: Secondary | ICD-10-CM | POA: Diagnosis not present

## 2020-02-05 DIAGNOSIS — I110 Hypertensive heart disease with heart failure: Secondary | ICD-10-CM | POA: Diagnosis not present

## 2020-02-05 DIAGNOSIS — L89610 Pressure ulcer of right heel, unstageable: Secondary | ICD-10-CM | POA: Diagnosis not present

## 2020-02-05 DIAGNOSIS — I4819 Other persistent atrial fibrillation: Secondary | ICD-10-CM | POA: Diagnosis not present

## 2020-02-11 DIAGNOSIS — I110 Hypertensive heart disease with heart failure: Secondary | ICD-10-CM | POA: Diagnosis not present

## 2020-02-11 DIAGNOSIS — L89610 Pressure ulcer of right heel, unstageable: Secondary | ICD-10-CM | POA: Diagnosis not present

## 2020-02-11 DIAGNOSIS — I4819 Other persistent atrial fibrillation: Secondary | ICD-10-CM | POA: Diagnosis not present

## 2020-02-11 DIAGNOSIS — Z48815 Encounter for surgical aftercare following surgery on the digestive system: Secondary | ICD-10-CM | POA: Diagnosis not present

## 2020-02-11 DIAGNOSIS — I5042 Chronic combined systolic (congestive) and diastolic (congestive) heart failure: Secondary | ICD-10-CM | POA: Diagnosis not present

## 2020-02-11 DIAGNOSIS — I2510 Atherosclerotic heart disease of native coronary artery without angina pectoris: Secondary | ICD-10-CM | POA: Diagnosis not present

## 2020-02-18 NOTE — Progress Notes (Signed)
Patient ID: [name] [name], male   DOB: 24-Jul-1940, 79 y.o.   MRN: [id number]          [name] [name] [facility] Medical Record #[id number] Date of Birth: May 27, 1941  Referring: [name], MD Primary Care: Street, [name], MD  Chief Complaint:    Chief Complaint  Patient presents with  . 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  SURGICAL PROCEDURE: Left subclavian to carotid bypass with 8 mm graft  and ligation of proximal left subclavian artery. Endovascular repair of  descending thoracic aorta with coverage of the left subclavian and  distal extension with a proximal 40 x 40 x 20 GORE-TEX device with a  second extension 45 x 45 x 15 cm and aortogram x3.  2010- aortic valve replacement replacement of aortic root and ascending aorta and replacement of aortic arch to the left subclavian artery.  History of Present Illness:     Patient is a 79 year-old male who on 02/19/2009 presented In respiratory distress on a ventilator with a large ascending aneurysm and acute aortic insufficiency and aortic arch aneurysm and at that time underwent aortic valve replacement replacement of aortic root and ascending aorta and replacement of aortic arch to the left subclavian artery. Since that time he's had an acute myocardial infarction and had a stent placed. In November 2014 prior to his yearly followup [facility] with an acute type III aortic dissection. He was treated medically, there is hospital stay he did develop persistent atrial fibrillation and ultimately was discharged home on anticoagulation.    August 28 2013  he underwent left subclavian to  carotid bypass ligation of the proximal left subclavian artery an endovascular repair of descending thoracic aorta with coverage of the left subclavian artery. \  The patient returns today with a follow-up CT scan of the chest and abdomen.  Since last seen he has had a right eye blindness.  He has become more unstable on his feet.  His wife does note that he gets out and walks the dogs occasionally.  He denies chest pain.  This year the patient has had significant hospitalizations with episodes of congestive heart failure, he also had a hernia repair-that ultimately required 4 admissions.   Current Activity/ Functional Status: Mobility/Ambulation: Independent with mobility prior to admission in the home on all surfaces with no assistive devices for unlimited distances.   Zubrod Score: At the time of surgery this patient's most appropriate activity status/level should be described as:  Normal activity, no symptoms  Symptoms, fully ambulatory  Symptoms, in bed less than or equal to 50% of the time  Symptoms, in bed greater than 50% of the time but less than 100%  Bedridden  Moribund   Past Medical History:  Diagnosis Date  . Acute on chronic diastolic heart failure (HCC) 08/22/2013  . Acute on chronic respiratory failure with hypoxia (HCC) 10/03/2019  . Acute on chronic systolic heart failure (HCC) 05/11/2013   EF from 35% to 50-55% on 6/14 ECHO.   Marland Kitchen Acute respiratory failure with hypoxia (HCC) 10/07/2013  . AKI (acute kidney injury) (HCC) 12/17/2019  . Aortic atherosclerosis (HCC) 05/05/2013  . Aortic dissection (HCC) 05/04/2013  Descending only, had aneurysm of ascending but no dissection   02/12/2014 Stable aortic stent graft over the aortic arch and descending thoracic aorta with significant reduction in the mural thrombus of the native aneurysm sac. No evidence of dissection or endoleak.  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 No evidence of abdominal aortic  dissection.  Occluded proximal left subclav  . Aortic valve insufficiency   . Arthritis    "probably in my knees before they replaced them" (01/28/2015)  . Atrial fibrillation (HCC) 05/21/2013  . Atrial flutter (HCC)   . Bradycardia 08/23/2013  . CAD (coronary artery disease)   . Chest pain 01/28/2015  . CHF (congestive heart failure) (HCC)   . Chronic diastolic heart failure (HCC) 01/28/2015  . Descending aortic aneurysm (HCC)   . DVT (deep venous thrombosis) (HCC)    ?LLE post knee surgery  . Dyspnea 09/2019  . HCAP (healthcare-associated pneumonia) 10/11/2013  . History of blood transfusion    "after one of my knee surgeries"  . HTN (hypertension)   . Hyperlipidemia   . Hypotension 12/17/2019  . Incarcerated left inguinal hernia s/p repair 12/02/2019 12/01/2019  . Insomnia 09/04/2013  . Multiple fractures of ribs, right side, init for clos fx 09/04/2019  . Near syncope 01/28/2015  . Pressure injury of skin 10/06/2019  . 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 Recurrent UTI 10/06/2013  . Shock circulatory (HCC) 10/07/2013  . Thoracic aneurysm   . Tobacco abuse   . Urinary retention 10/06/2013  . UTI (urinary tract infection) 10/06/2013    Past Surgical History:  Procedure Laterality Date  . AORTIC VALVE REPLACEMENT  02/19/2009  . AORTO-FEMORAL BYPASS GRAFT  03/2010   ascending aortic and arch aneurysm repair/notes 04/09/2009  . CARDIAC VALVE REPLACEMENT    . CAROTID-SUBCLAVIAN BYPASS GRAFT Left 08/26/2013   Procedure: BYPASS GRAFT CAROTID-SUBCLAVIAN;  Surgeon: Nada Libman, MD;  Location: [facility] OR;  Service: Vascular;  Laterality: Left;  . CATARACT EXTRACTION W/ INTRAOCULAR LENS  IMPLANT, BILATERAL Bilateral   . ENDOVASCULAR STENT INSERTION N/A 08/26/2013   Procedure:  THORACIC STENT GRAFT INSERTION;  Surgeon: Nada Libman, MD;  Location: [facility] OR;  Service: Vascular;  Laterality: N/A;  . EYE SURGERY    . INGUINAL HERNIA REPAIR  Left 12/02/2019   Procedure: REPAIR LEFT INGUINAL HERNIA WITH MESH;  Surgeon: Violeta Gelinas, MD;  Location: [facility] OR;  Service: General;  Laterality: Left;  . INGUINAL HERNIA REPAIR Left 12/18/2019   Procedure: LAPAROSCOPIC ASSISTED REPAIR OF RECURRENT INCARCERATED LEFT INGUINAL HERNIA WITH MESH; LAPAOSCOPIC REPAIR OF SEROSAL TEAR;  Surgeon: Gaynelle Adu, MD;  Location: [facility] OR;  Service: General;  Laterality: Left;  . INSERTION OF MESH Left 12/02/2019   Procedure: INSERTION OF MESH;  Surgeon: Violeta Gelinas, MD;  Location: [facility] OR;  Service: General;  Laterality: Left;  . JOINT REPLACEMENT    . KNEE ARTHROSCOPY Bilateral   . REPLACEMENT TOTAL KNEE Bilateral   . right groin lymphocele Right 03/29/2009  . RIGHT/LEFT HEART CATH AND CORONARY ANGIOGRAPHY N/A 10/06/2019   Procedure: RIGHT/LEFT HEART CATH AND CORONARY ANGIOGRAPHY;  Surgeon: Lyn Records, MD;  Location: [facility] INVASIVE CV LAB;  Service: Cardiovascular;  Laterality: N/A;  . THORACENTESIS  2010 X 2  . TONSILLECTOMY      Social History   Tobacco Use  Smoking Status Former Smoker  . Packs/day: 0.00  . Types: Cigarettes  . Quit date: 04/16/1974  . Years since quitting: 45.9  Smokeless Tobacco Never Used    Social History   Substance and Sexual Activity  Alcohol Use No      Allergies  Allergen Reactions  . Lortab [Hydrocodone-Acetaminophen] Hives    No trouble breathing  . Morphine And Related Hives  . Other Other (See Comments)    Staples from surgery caused infection    Current Outpatient Medications  Medication Sig Dispense Refill  . acetaminophen (TYLENOL) 500 MG tablet Take 2 tablets (1,000 mg total) by mouth every 6 (six) hours as needed. (Patient taking differently: Take 1,000 mg by mouth every 6 (six) hours as needed for mild pain or moderate pain. ) 30 tablet 0  . amiodarone (PACERONE) 200 MG tablet Take 1 tablet (200 mg total) by mouth daily. 30 tablet 11  . atorvastatin (LIPITOR) 10 MG tablet Take 1 tablet by mouth  once daily (Patient taking differently: Take 10 mg by mouth daily. ) 90 tablet 3  . ELIQUIS 5 MG TABS tablet Take 1 tablet by mouth twice daily 180 tablet 2  . ferrous sulfate 325 (65 FE) MG tablet Take 1 tablet (325 mg total) by mouth daily with breakfast. 90 tablet 3  . furosemide (LASIX) 40 MG tablet Take 1 tablet (40 mg total) by mouth daily. 30 tablet 1  . lansoprazole (PREVACID) 30 MG capsule Take 30 mg by mouth daily.    Marland Kitchen loratadine (CLARITIN) 10 MG tablet Take 1 tablet (10 mg total) by mouth daily. 30 tablet 11  . losartan (COZAAR) 25 MG tablet Take 1 tablet (25 mg total) by mouth daily. 90 tablet 3  . metoprolol succinate (TOPROL-XL) 100 MG 24 hr tablet Take 1 tablet (100 mg total) by mouth daily. Take with or immediately following a meal. 90 tablet 3  . Multiple Vitamin (MULTIVITAMIN WITH MINERALS) TABS tablet Take 1 tablet by mouth daily.    . potassium chloride SA (KLOR-CON) 20 MEQ tablet Take 1 tablet (20 mEq total) by mouth daily. 30 tablet 0  . spironolactone (ALDACTONE) 25 MG tablet Take 0.5 tablets (12.5 mg total) by mouth daily. 45 tablet 3  . nitroGLYCERIN (NITROSTAT) 0.4 MG SL tablet Place 1 tablet (0.4 mg total) under the tongue every 5 (five) minutes as needed for chest pain (X3 DOSES BEFORE CALLING 911). (Patient not taking: Reported on 02/19/2020) 25 tablet 3  . traMADol (ULTRAM) 50 MG tablet Take 1 tablet (50 mg total) by mouth every 6 (six) hours as needed for severe pain. (Patient not taking: Reported on 02/19/2020) 30 tablet 0   No current facility-administered medications for this visit.      Physical Exam: BP 102/64   Pulse 77   Temp 97.6 F (36.4 C) (Skin)   Resp 20   Ht 5' 10.5" (1.791 m)   Wt 170 lb (77.1 kg)   SpO2 94% Comment: RA  BMI 24.05 kg/m   General appearance: alert, cooperative, appears older than stated age and no distress Head: Normocephalic, without obvious abnormality, atraumatic Neck: no adenopathy, no carotid bruit, no JVD, supple,  symmetrical, trachea midline and thyroid not enlarged, symmetric, no tenderness/mass/nodules Lymph nodes: Cervical, supraclavicular, and axillary nodes normal. Resp: clear to auscultation bilaterally Back: symmetric, no curvature. ROM normal. No CVA tenderness. Cardio: regular rate and rhythm, S1, S2 normal, no murmur, click, rub or gallop GI: soft, non-tender; bowel sounds normal; no masses,  no organomegaly Extremities: extremities normal, atraumatic, no cyanosis or edema and Homans sign is negative, no sign of DVT Neurologic: Grossly normal Patient has  palpable DP and PT pulses bilaterally  Diagnostic Studies & Laboratory data:     Recent Radiology Findings:  CT ANGIO CHEST AORTA W/CM & OR WO/CM &  CT Angio Abd/Pel w/ and/or w/o  Result Date: 02/20/2020 CLINICAL DATA:  79 year old with history of aortic dissection and status post stent placement. History aortic valve replacement. EXAM: CT ANGIOGRAPHY CHEST, ABDOMEN AND PELVIS TECHNIQUE: Non-contrast CT of the chest was initially obtained. Multidetector CT imaging through the chest, abdomen and pelvis was performed using the standard protocol during bolus administration of intravenous contrast. Multiplanar reconstructed images and MIPs were obtained and reviewed to evaluate the vascular anatomy. CONTRAST:  33mL ISOVUE-370 IOPAMIDOL (ISOVUE-370) INJECTION 76% COMPARISON:  Chest CTA 10/03/2019 and abdominal CT 12/17/2019 and chest CTA 05/02/2018 FINDINGS: CTA CHEST FINDINGS Cardiovascular: Patient has a prosthetic aortic valve. Coronary arteries are heavily calcified. Stable position of the aortic stent involving the aortic arch and descending thoracic aorta. Aortic stent is widely patent without dissection. Normal caliber of the ascending thoracic aorta. Again noted is a common origin for the left common carotid artery and brachiocephalic artery. Again noted is mild narrowing and irregularity near the origin of the right brachiocephalic artery which  is unchanged. Common carotid arteries are patent bilaterally. 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 There is a patent left carotid to subclavian artery bypass graft. Mild ectasia and irregularity and subclavian arteries is unchanged. Proximal left vertebral artery is patent. No flow in the proximal right vertebral artery. These findings in the great vessels are unchanged. Stable size of the proximal descending thoracic aorta and stent measuring 4.4 cm. Mid descending thoracic aorta is stable measuring 4.7 cm. Stent terminates in the distal descending thoracic aorta. Distal descending thoracic aorta measures 4.2 cm and stable. Mediastinum/Nodes: Small hiatal hernia. Precarinal lymph node measures 1.6 cm in the short axis and stable. Thyroid tissue is the normal limits. No axillary lymph node enlargement. Small amount of soft tissue in the right hilum is stable. Stable enlargement of the heart. No significant pericardial effusion. Lungs/Pleura: Trachea and mainstem bronchi are patent. No pleural effusions. Subtle ground-glass density in the superior segment of the right lower lobe on sequence 8 image 39 measures up to 9 mm. Pleural-based density in the right lower lobe on sequence 8, image 49 has minimally changed. Small ground-glass density in the posterior right upper lobe on sequence 8 image 30 measures 7 mm. Small pleural-based densities near the right lung apex are similar to the prior examination in April 2021. Bilateral pleural effusions have resolved from the exam in April 2021. Stable 7 mm noncalcified nodule in anterior right upper lobe on sequence 8, image image 23. This nodule has not significant changed since 2015 and compatible with a benign pulmonary nodule. Stable volume loss in the left lower lobe. Musculoskeletal: Old fractures involving posterior right ribs. These fractures have demonstrated interval healing since April 2021. Median sternotomy wires. Review of the MIP images  confirms the above findings. CTA ABDOMEN AND PELVIS FINDINGS VASCULAR Aorta: Supra celiac abdominal aorta measures 4.1 cm and stable. Atherosclerotic calcifications in the abdominal aorta. Infrarenal abdominal aorta measures 3.2 cm and stable since 2019. Celiac: Main celiac trunk is patent without significant stenosis. Left gastric artery originates directly from the aorta and evidence for replaced left hepatic artery coming off the left gastric artery. Right hepatic artery appears to originate directly from the aorta. SMA: Patent without evidence of aneurysm, dissection, vasculitis or significant stenosis. Renals: Mild narrowing at the origin  of the right renal artery without aneurysm or dissection. Atherosclerotic plaque near the origin of the left renal artery without significant stenosis. No evidence for aneurysm or dissection involving the left renal artery. IMA: Patent Inflow: Left common iliac artery is tortuous. Atherosclerotic calcifications involving the iliac arteries. Common, internal and external iliac arteries are patent bilaterally. Proximal inflow: Proximal femoral arteries are patent bilaterally. Veins: No obvious venous abnormality within the limitations of this arterial phase study. Review of the MIP images confirms the above findings. NON-VASCULAR Hepatobiliary: No focal abnormality to the liver. Evidence for gallstones. No gallbladder distension. Pancreas: Unremarkable. No pancreatic ductal dilatation or surrounding inflammatory changes. Spleen: Normal in size without focal abnormality. Adrenals/Urinary Tract: Normal appearance of the adrenal glands. Normal appearance of the urinary bladder. Small hyperdense structure along the right kidney upper pole measures roughly 1.4 cm. This is hyperdense on both the noncontrast and postcontrast images. In fact, there was a larger cyst in this area in 2016 and this probably represents an involuted hyperdense cyst. Additional small exophytic structures in  the right kidney likely represent small cysts. Stable appearance of left kidney. No hydronephrosis. Stomach/Bowel: Extensive diverticulosis involving the descending colon and proximal sigmoid colon. Left inguinal hernia repair with evidence of mesh material. Proximal appendix appears normal. No evidence for bowel inflammation or obstruction. Small hiatal hernia. Lymphatic: No suspicious lymph node enlargement in the abdomen or pelvis. Reproductive: Again noted is a prominent prostate gland. Other: Changes compatible with a left inguinal hernia repair. No ascites. Negative for free air. Musculoskeletal: Multilevel disc disease in lumbar spine. No acute bone abnormality. Review of the MIP images confirms the above findings. IMPRESSION: 1. Stable appearance of the thoracic aorta and thoracic aortic stent graft. No acute abnormalities. 2. Stable appearance of the great vessels with postsurgical changes. 3. Stable aneurysm of the proximal abdominal aorta measuring 4.1 cm. Stable infrarenal abdominal aortic aneurysm measuring 3.2 cm. Recommend follow-up every 12 months and vascular consultation. This recommendation follows ACR consensus guidelines: White Paper of the ACR Incidental Findings Committee II on Vascular Findings. J Am Coll Radiol 2013; 10:789-794. 4. Stable cardiac enlargement. Status post aortic valve replacement. 5. Small pleural effusions have resolved since April 2021. There are new small ground-glass densities in the right upper lung, largest measuring 9 mm. Based on the changes from the exam in April 2021, suspect these small ground-glass opacities could be postinflammatory. Non-contrast chest CT at 3-6 months is recommended. If nodules persist, subsequent management will be based upon the most suspicious nodule(s). This recommendation follows the consensus statement: Guidelines for Management of Incidental Pulmonary Nodules Detected on CT Images: From the Fleischner Society 2017; Radiology 2017;  284:228-243. 6. Surgical pair of left inguinal hernia. 7. Variant anatomy involving the celiac trunk and hepatic arteries. 8. Cholelithiasis. 9. Renal cysts as described. Electronically Signed   By: Richarda Overlie M.D.   On: 02/20/2020 08:49   I have independently reviewed the above radiology studies  and reviewed the findings with the patient.   Ct Angio Chest Aorta W/cm &/or Wo/cm  Result Date: 05/02/2018 CLINICAL DATA:  Known hx of dissection , aortic valve replacement , s/p dissection, f/u exam 2-3 years Cancer-none Prev in pacs^ EXAM: CT ANGIOGRAPHY CHEST WITH CONTRAST TECHNIQUE: Multidetector CT imaging of the chest was performed using the standard protocol during bolus administration of intravenous contrast. Multiplanar CT image reconstructions and MIPs were obtained to evaluate the vascular anatomy. CONTRAST:  75mL ISOVUE-370 IOPAMIDOL (ISOVUE-370) INJECTION 76% Creatinine was obtained on site  at [facility] at [address] Results: Creatinine 1.0 mg/dL.  GFR 72 COMPARISON:  10/19/2016 and previous FINDINGS: Cardiovascular: Biatrial cardiac enlargement. No pericardial effusion. Incomplete opacification of the pulmonary arterial tree; the exam was not optimized for detection of pulmonary emboli. Mitral annulus calcifications. Moderate coronary arterial calcifications. Previous AVR. Stable ascending aortic tube graft repair. Common origin of the right brachiocephalic and left common carotid arteries, with mild stenosis just beyond the right brachiocephalic origin. Stent graft in the distal arch through the proximal and mid descending thoracic aorta. Proximal tines partially overlay the common brachiocephalic origin. There is origin occlusion/thrombosis of the proximal left common carotid artery, with patent carotid subclavian graft. Good apposition of distal stent graft tines. Aneurysmal dilatation of the distal descending thoracic aorta up to 4.3 cm diameter. Fusiform dilatation of the  proximal abdominal aorta measuring 3.9 cm diameter at the level of the celiac axis origin, 3.3 cm the visualized infrarenal segment, incompletely visualized distally. Mediastinum/Nodes: No hilar or mediastinal adenopathy. Lungs/Pleura: Trace left pleural effusion. No pneumothorax. Pulmonary emphysematous changes most marked in the apices. Upper Abdomen: 3 cm exophytic lesion from the upper pole right kidney, stable since 08/22/2013. Partially calcified subcentimeter stones in the dependent aspect of the nondilated gallbladder. No acute findings. Musculoskeletal: Previous median sternotomy. Spondylitic changes in the visualized lower cervical spine. Mild thoracolumbar dextroscoliosis. No fracture or worrisome bone lesion. Review of the MIP images confirms the above findings. IMPRESSION: 1. Stable thoracic aortic stent graft and ascending aortic graft repair with AVR. 2. Fusiform aneurysmal dilatation of the native distal descending thoracic aorta up to 4.3 cm, infrarenal aorta at least 3.3 cm incompletely visualized. 3. Coronary calcifications. The severity of coronary artery disease and any potential stenosis cannot be assessed on this non-gated CT examination. Assessment for potential risk factor modification, dietary therapy or pharmacologic therapy may be warranted, if clinically indicated. 4. Cholelithiasis Electronically Signed   By: Corlis Leak M.D.   On: 05/02/2018 09:52    Ct Angio Chest Aorta W &/or Wo Contrast  Result Date: 10/19/2016 CLINICAL DATA:  Thoracic aortic aneurysm, post stent. History of dissection. EXAM: CT ANGIOGRAPHY CHEST WITH CONTRAST TECHNIQUE: Multidetector CT imaging of the chest was performed using the standard protocol during bolus administration of intravenous contrast. Multiplanar CT image reconstructions and MIPs were obtained to evaluate the vascular anatomy. CONTRAST:  75 cc Isovue 370 IV COMPARISON:  09/23/2015 FINDINGS: Cardiovascular: Patient is status post stent graft in  the aortic arch and descending thoracic aorta. Maximum diameter in the distal descending thoracic aorta is 4.5 cm, stable. No dissection. No evidence of endoleak. Prior aortic valve repair she. Prior tube graft repair of ascending thoracic aorta with graft to great vessels. 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 Cardiomegaly noted. Extensive coronary artery calcifications. Mediastinum/Nodes: No mediastinal, hilar, or axillary adenopathy. Lungs/Pleura: Linear areas of scarring in the lung bases. No confluent opacities or effusions. Upper Abdomen: Imaging into the upper abdomen shows no acute findings. Gallstones layering in the gallbladder. Musculoskeletal: Chest wall soft tissues are unremarkable. No acute bony abnormality. Review of the MIP images confirms the above findings. IMPRESSION: Status post tube graft repair of the ascending thoracic aorta and stent graft repair of the aortic arch and descending thoracic aorta. Stable size and appearance. No dissection or endoleak. No acute cardiopulmonary disease. Cholelithiasis. Electronically Signed   By: Charlett Nose M.D.   On: 10/19/2016  11:53   Ct Angio Chest Aorta W/cm &/or Wo/cm  09/23/2015  CLINICAL DATA:  F/u s/p stent for TAA No injury No hx cancer Ex smoker x 30 years EXAM: CT ANGIOGRAPHY CHEST WITH CONTRAST TECHNIQUE: Multidetector CT imaging of the chest was performed using the standard protocol during bolus administration of intravenous contrast. Multiplanar CT image reconstructions and MIPs were obtained to evaluate the vascular anatomy. CONTRAST:  80 mL Isovue 370 IV COMPARISON:  01/28/2015 FINDINGS: Vascular: Right arm IV contrast injection. SVC patent. Four-chamber cardiac enlargement. Mildly dilated central pulmonary arteries. Satisfactory opacification of pulmonary arteries noted, and there is no evidence of pulmonary emboli. Patent bilateral  pulmonary veins drain into the left atrium. Scattered coronary calcifications. Previous AVR. Previous tube graft repair of the ascending aorta with graft to great vessels. There is thrombosis of the branch to the left subclavian artery. Patent left carotid to subclavian arterial bypass graft. Stent graft extends through the aortic arch to the distal descending thoracic aorta. Proximal and distal tines appear well apposed. No endoleak. No dissection. Maximum aortic diameter 5.2 cm in the mid descending, previously 5.4 cm at the same level on previous examination by my measurement. The native distal descending thoracic aorta is dilated up to 3.9 cm diameter with moderate calcified plaque in the wall. The proximal abdominal aorta tapers to a diameter 3 cm at the level of the SMA, not visualized distally. Mediastinum/Lymph Nodes: No masses or pathologically enlarged lymph nodes identified. No pericardial effusion. Lungs/Pleura: 6 mm subpleural nodule in the anterior segment right upper lobe, previously 7.5 mm. Lungs otherwise clear. No pleural effusion. No pneumothorax. Mild emphysematous changes in the lung apices. Upper abdomen: At least 2 subcentimeter partially calcified stones in the dependent aspect of the gallbladder, nondilated. Musculoskeletal: Minimal spurring in the mid and lower thoracic spine. Previous median sternotomy. Review of the MIP images confirms the above findings. IMPRESSION: 1. 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 2. Stable postop changes of AVR, ascending aortic repair, graft to brachiocephalic vessels, and left carotid subclavian bypass. 3. Cholelithiasis. Electronically Signed   By: Corlis Leak M.D.   On: 09/23/2015 12:30   ICt Angio Chest Aorta W/cm &/or Wo/cm  10/15/2014   CLINICAL DATA:  Thoracic aortic stent graft  EXAM: CT ANGIOGRAPHY  CHEST WITH CONTRAST  TECHNIQUE: Multidetector CT imaging of the chest was performed using the standard protocol during bolus administration of intravenous contrast. Multiplanar CT image reconstructions and MIPs were obtained to evaluate the vascular anatomy.  CONTRAST:  75 cc Isovue 370  COMPARISON:  02/12/2014  FINDINGS: Chest:  Thoracic aortic stent graft is stable in position. It begins just distal to the takeoff of the innominate artery with a bovine configuration. There is no evidence of endoleak. Maximal aneurysm sac diameter on image 43 of series 5 measures 5.3 cm. This is improved compared to my direct measurements on the prior study. At that time, I measure a diameter of 5.6 cm. There is calcification between the innominate artery and aortic arch on image 31 of series 5.  There is persistent mild narrowing at the origin of the innominate artery just above the takeoff of the left common carotid artery. 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 This is unchanged. Right subclavian and common carotid arteries are patent. Origin of the right vertebral artery fills with contrast  but then is non-opacified. Left common carotid artery is patent. Proximal left subclavian artery has been ligated. Left carotid to left subclavian bypass graft is patent. Left vertebral artery is dominant and patent. Left subclavian artery is patent.  Aortic valve replacement is visualized. The aortic root and ascending aorta are have a normal appearance without evidence of dissection. Maximal diameters 3.3 cm.  No obvious acute pulmonary thromboembolism.  Stable 5 mm right upper lobe pulmonary nodule on image 57 of series 602. Minimal dependent atelectasis  No pneumothorax or pleural effusion.  Cholelithiasis  Review of the MIP images confirms the above findings.  IMPRESSION: Stable position of the thoracic aortic stent graft. There is no evidence of endoleak and aneurysm sac diameter has diminished from 5.6 cm to  5.3 cm.  There is poor filling of the right vertebral artery. Early subclavian steal syndrome cannot be excluded in this patient suggesting significant narrowing of the right subclavian artery circuit. This could conceivably occur at its origin at the level of the stent graft. Carotid Doppler study with evaluation of vertebral artery flow was recommended. There is no obvious area of narrowing on this study.   Electronically Signed   By: Jolaine Click M.D.   On: 10/15/2014 14:39    Ct Angio Chest Aorta W/cm &/or Wo/cm  02/12/2014   CLINICAL DATA:  Evaluate aortic dissection. Followup thoracic aortic aneurysm. Previous aortic valve replacement with thoracic aortic aneurysm stent graft.  EXAM: CT ANGIOGRAPHY CHEST AND ABDOMEN  TECHNIQUE: Multidetector CT imaging of the chest and abdomen was performed using the standard protocol during bolus administration of intravenous contrast. Multiplanar CT image reconstructions and MIPs were obtained to evaluate the vascular anatomy.  CONTRAST:  80mL OMNIPAQUE IOHEXOL 350 MG/ML SOLN  COMPARISON:  10/06/2013, 08/22/2013 as well as 05/18/2011  FINDINGS: CTA CHEST FINDINGS  Sternotomy wires are present. The lungs are adequately inflated without consolidation or effusion. There is very minimal apical emphysematous disease. There is a 7-8 mm nodule over the anterior right upper lobe stable since 2012. There is moderate stable cardiomegaly. No change and coronary artery calcifications with suggestion of stent over the lateral circumflex artery. Evidence of previous aortic valve replacement. No pericardial or mediastinal fluid collection. No mediastinal or hilar adenopathy.  The posterior arch/descending thoracic aortic stent graft is unchanged which excludes the previously noted aneurysm/dissection. No current evidence of a dissection. Significantly less the low density thrombus in the native aneurysm sac. No evidence of endoleak. Continued evidence of occluded proximal left  subclavian artery with reconstitution via patent left common carotid to subclavian graft.  Review of the MIP images confirms the above findings.  CTA ABDOMEN FINDINGS  Abdominal images demonstrate minimal cholelithiasis. The liver, spleen, pancreas and adrenal glands are within normal. Kidneys are normal in size without hydronephrosis or nephrolithiasis. No change in a 3.4 cm cyst over the upper pole right kidney. Few other smaller subcentimeter renal cortical hypodensities likely cysts but too small to characterize. There is diverticulosis of the colon. Appendix is within normal. Visualize ureters are within normal.  There is mild calcified plaque over the abdominal aorta. There is no evidence of abdominal aortic dissection. There is mild aneurysmal dilatation of the infrarenal abdominal aorta measuring 3.0 cm in AP diameter just below the take-off of the renal arteries. The celiac axis, superior and inferior mesenteric arteries are patent. Single bilateral renal arteries are patent with mild focal narrowing at the origin of the right renal artery. There are degenerative changes of the spine.  Review of the MIP images confirms the above findings.  IMPRESSION: Stable aortic stent graft over the aortic arch and descending thoracic aorta with significant reduction in the mural thrombus of the native aneurysm sac. No evidence of dissection or endoleak.  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 No evidence of abdominal aortic dissection.  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  Stable moderate cardiomegaly with atherosclerotic coronary artery disease and left lateral circumflex stent. Previous median sternotomy and aortic valve replacement.  Mild cholelithiasis.  Bilateral stable renal cysts with the largest measuring 3.4 cm over the upper pole right kidney.  Diverticulosis of the colon.   Electronically Signed   By: Elberta Fortis  M.D.   On: 02/12/2014 10:02    Ct Angio Head W/cm &/or Wo Cm  08/22/2013   CLINICAL DATA:  79 year old male with history of thoracic aneurysm status post repair. Progressive weight gain, swelling and shortness of Breath. Recently thought to have sinus infection, treated with antibiotics and steroids. Symptoms increasing. Initial encounter.  EXAM: CT ANGIOGRAPHY HEAD AND NECK  TECHNIQUE: Multidetector CT imaging of the head and neck was performed using the standard protocol during bolus administration of intravenous contrast. Multiplanar CT image reconstructions and MIPs were obtained to evaluate the vascular anatomy. Carotid stenosis measurements (when applicable) are obtained utilizing NASCET criteria, using the distal internal carotid diameter as the denominator.  CONTRAST:  96mL OMNIPAQUE IOHEXOL 350 MG/ML SOLN  COMPARISON:  Chest abdomen and pelvis CTA from the same day reported separately.  FINDINGS: CTA HEAD FINDINGS  Calvarium intact. Negative scalp soft tissues. Cerebral volume is within normal limits for age. No midline shift, ventriculomegaly, mass effect, evidence of mass lesion, intracranial hemorrhage or evidence of cortically based acute infarction. Gray-white matter differentiation is within normal limits throughout the brain. No abnormal enhancement identified.  VASCULAR FINDINGS:  Major intracranial venous structures are enhancing.  Dominant distal left vertebral artery is patent without stenosis despite some calcified plaque. Retrograde supply to the non dominant distal right vertebral artery. Right PICA and AICA are patent. Dominant left AICA. The basilar artery is patent without stenosis. SCA and PCA origins are within normal limits. Normal left posterior communicating artery, the right is diminutive or absent. Bilateral PCA branches are within normal limits.  Some cavernous sinus venous contrast artifact is present. Both ICA siphons are patent. Ophthalmic and posterior communicating arteries  are within normal limits. Normal carotid termini. Normal MCA and ACA origins. Anterior communicating artery and bilateral ACA branches are within normal limits. Bilateral MCA branches are within normal limits.  Review of the MIP images confirms the above findings.  CTA NECK FINDINGS  Chest findings are reported separately (please see that report). Negative thyroid, larynx, pharynx, parapharyngeal spaces, retropharyngeal space. Extensive dental hardware artifact. Negative visualized sublingual space, submandibular glands and submandibular space. Negative parotid glands. No acute orbits soft tissue findings.  No cervical lymphadenopathy. Mucous retention cyst left maxillary sinus. Other Visualized paranasal sinuses and mastoids are clear. No acute osseous abnormality identified. Degenerative changes in the cervical spine.  VASCULAR FINDINGS:  SEE CHEST CTA FINDINGS FROM TODAY REPORTED SEPARATELY.  Both internal jugular veins are patent. The left subclavian and innominate vein are patent. Negative non contrast appearance of the right subclavian. The right innominate vein is patent.  Great vessel origins are patent, dolichoectatic. Widespread mild soft and calcified circumferential plaque in the great vessels.  Patent, mildly dolichoectatic right CCA. Patent right carotid bifurcation with  no right ICA stenosis despite calcified plaque. Tortuous, mildly dolichoectatic cervical right ICA.  No proximal right subclavian artery stenosis, with some dolichoectasia. Non dominant right vertebral artery is occluded at its origin. Distal right vertebral artery flow is reconstituted briefly at the C2-C3 level, but then re- occludes below the skullbase. Intracranial findings detailed above.  Patent, the dolichoectatic left CCA. Calcified plaque at the left carotid bifurcation which is significantly affected by dental streak artifact. Loss of enhancement in the left ICA origin and bulb felt to be artifactual (see series 12, image  172). Tortuous/dolichoectatic but otherwise negative cervical left ICA be on that level.  No proximal left subclavian artery stenosis. Dominant left vertebral artery origin widely patent. Tortuous proximal left vertebral artery. Dominant left vertebral artery tortuous and patent to the skullbase.  Review of the MIP images confirms the above findings.  IMPRESSION: 1. Abnormal thoracic aorta, see chest CTA from today reported separately. 2. Patent, dolichoectatic great vessels without arterial dissection in the neck. 3. Occluded non dominant right vertebral artery. Patent, dominant left vertebral artery. 4. Suspect artifactual decreased enhancement of the left ICA origin, related to severe dental hardware artifact. Absence of any proximal left ICA stenosis could be confirmed with either carotid Doppler or neck MRA if necessary. 5. Distal right vertebral artery supplied in a retrograde fashion from the left. Intracranial atherosclerosis, but no intracranial stenosis and otherwise negative intracranial CTA. 6.  No acute intracranial abnormality.   Electronically Signed   By: Augusto Gamble M.D.   On: 08/22/2013 12:26       Recent Lab Findings: Lab Results  Component Value Date   WBC 7.0 12/22/2019   HGB 11.1 (L) 12/22/2019   HCT 36.9 (L) 12/22/2019   PLT 258 12/22/2019   GLUCOSE 100 (H) 12/22/2019   CHOL 152 10/10/2013   TRIG 116 10/07/2013   HDL 38 (L) 07/29/2010   LDLCALC  07/29/2010    80        Total Cholesterol/HDL:CHD Risk Coronary Heart Disease Risk Table                     Men   Women  1/2 Average Risk   3.4   3.3  Average Risk       5.0   4.4  2 X Average Risk   9.6   7.1  3 X Average Risk  23.4   11.0        Use the calculated Patient Ratio above and the CHD Risk Table to determine the patient's CHD Risk.        ATP III CLASSIFICATION (LDL):  <100     mg/dL   Optimal  161-096  mg/dL   Near or Above                    Optimal  130-159  mg/dL   Borderline  045-409  mg/dL   High   >811     mg/dL   Very High   ALT 24 91/47/8295   AST 34 12/16/2019   NA 144 12/22/2019   K 3.6 12/22/2019   CL 110 12/22/2019   CREATININE 1.07 12/22/2019   BUN 21 12/22/2019   CO2 21 (L) 12/22/2019   TSH 1.342 10/04/2019   INR 1.2 10/03/2019   HGBA1C 6.1 (H) 08/27/2013    Chronic total occlusion of the mid right coronary, collateralized right to right and left to right.  Widely patent left main.  Widely patent LAD  with moderate luminal irregularities proximal to distal.  Widely patent stent in the proximal to mid circumflex with up to 50% narrowing distal to the stent.  Moderate pulmonary hypertension.  Mean pulmonary artery pressure 35 mmHg.  Mean pulmonary capillary wedge pressure 32 mmHg.  RECOMMENDATIONS:   Left ventricular systolic dysfunction is not on the basis of coronary disease.  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  On CTA of the chest this appears stable now almost 11  years from his original surgery and 6 years from his thoracic stent graft.   Plan follow-up CTA of the chest and abdomen in 18 months    Delight Ovens MD 03/02/2020 10:30 AM

## 2020-02-19 ENCOUNTER — Ambulatory Visit
Admission: RE | Admit: 2020-02-19 | Discharge: 2020-02-19 | Disposition: A | Discharge status: Home / Self Care | Payer: Medicare Other | Source: Ambulatory Visit | Attending: Cardiothoracic Surgery | Admitting: Cardiothoracic Surgery

## 2020-02-19 ENCOUNTER — Other Ambulatory Visit: Payer: Self-pay

## 2020-02-19 ENCOUNTER — Ambulatory Visit (INDEPENDENT_AMBULATORY_CARE_PROVIDER_SITE_OTHER): Payer: Medicare Other | Admitting: Cardiothoracic Surgery

## 2020-02-19 VITALS — BP 102/64 | HR 77 | Temp 97.6°F | Resp 20 | Ht 70.5 in | Wt 170.0 lb

## 2020-02-19 DIAGNOSIS — I2510 Atherosclerotic heart disease of native coronary artery without angina pectoris: Secondary | ICD-10-CM | POA: Diagnosis not present

## 2020-02-19 DIAGNOSIS — I714 Abdominal aortic aneurysm, without rupture: Secondary | ICD-10-CM | POA: Diagnosis not present

## 2020-02-19 DIAGNOSIS — I7101 Dissection of thoracic aorta: Secondary | ICD-10-CM

## 2020-02-19 DIAGNOSIS — I71019 Dissection of thoracic aorta, unspecified: Secondary | ICD-10-CM

## 2020-02-19 DIAGNOSIS — N281 Cyst of kidney, acquired: Secondary | ICD-10-CM | POA: Diagnosis not present

## 2020-02-19 MED ORDER — IOPAMIDOL (ISOVUE-370) INJECTION 76%
75.0000 mL | Freq: Once | INTRAVENOUS | Status: AC | PRN
  Administered 2020-02-19: 60 mL via INTRAVENOUS

## 2020-02-20 DIAGNOSIS — L89610 Pressure ulcer of right heel, unstageable: Secondary | ICD-10-CM | POA: Diagnosis not present

## 2020-02-20 DIAGNOSIS — I2510 Atherosclerotic heart disease of native coronary artery without angina pectoris: Secondary | ICD-10-CM | POA: Diagnosis not present

## 2020-02-20 DIAGNOSIS — I4819 Other persistent atrial fibrillation: Secondary | ICD-10-CM | POA: Diagnosis not present

## 2020-02-20 DIAGNOSIS — I110 Hypertensive heart disease with heart failure: Secondary | ICD-10-CM | POA: Diagnosis not present

## 2020-02-20 DIAGNOSIS — Z48815 Encounter for surgical aftercare following surgery on the digestive system: Secondary | ICD-10-CM | POA: Diagnosis not present

## 2020-02-20 DIAGNOSIS — I5042 Chronic combined systolic (congestive) and diastolic (congestive) heart failure: Secondary | ICD-10-CM | POA: Diagnosis not present

## 2020-02-23 DIAGNOSIS — I2510 Atherosclerotic heart disease of native coronary artery without angina pectoris: Secondary | ICD-10-CM | POA: Diagnosis not present

## 2020-02-23 DIAGNOSIS — Z48815 Encounter for surgical aftercare following surgery on the digestive system: Secondary | ICD-10-CM | POA: Diagnosis not present

## 2020-02-23 DIAGNOSIS — I110 Hypertensive heart disease with heart failure: Secondary | ICD-10-CM | POA: Diagnosis not present

## 2020-02-23 DIAGNOSIS — L89610 Pressure ulcer of right heel, unstageable: Secondary | ICD-10-CM | POA: Diagnosis not present

## 2020-02-23 DIAGNOSIS — I5042 Chronic combined systolic (congestive) and diastolic (congestive) heart failure: Secondary | ICD-10-CM | POA: Diagnosis not present

## 2020-02-23 DIAGNOSIS — I4819 Other persistent atrial fibrillation: Secondary | ICD-10-CM | POA: Diagnosis not present

## 2020-02-24 DIAGNOSIS — Z87891 Personal history of nicotine dependence: Secondary | ICD-10-CM | POA: Diagnosis not present

## 2020-02-24 DIAGNOSIS — I110 Hypertensive heart disease with heart failure: Secondary | ICD-10-CM | POA: Diagnosis not present

## 2020-02-24 DIAGNOSIS — I4819 Other persistent atrial fibrillation: Secondary | ICD-10-CM | POA: Diagnosis not present

## 2020-02-24 DIAGNOSIS — Z952 Presence of prosthetic heart valve: Secondary | ICD-10-CM | POA: Diagnosis not present

## 2020-02-24 DIAGNOSIS — I2510 Atherosclerotic heart disease of native coronary artery without angina pectoris: Secondary | ICD-10-CM | POA: Diagnosis not present

## 2020-02-24 DIAGNOSIS — L89610 Pressure ulcer of right heel, unstageable: Secondary | ICD-10-CM | POA: Diagnosis not present

## 2020-02-24 DIAGNOSIS — Z7901 Long term (current) use of anticoagulants: Secondary | ICD-10-CM | POA: Diagnosis not present

## 2020-02-24 DIAGNOSIS — E785 Hyperlipidemia, unspecified: Secondary | ICD-10-CM | POA: Diagnosis not present

## 2020-02-24 DIAGNOSIS — I5042 Chronic combined systolic (congestive) and diastolic (congestive) heart failure: Secondary | ICD-10-CM | POA: Diagnosis not present

## 2020-02-24 DIAGNOSIS — Z48815 Encounter for surgical aftercare following surgery on the digestive system: Secondary | ICD-10-CM | POA: Diagnosis not present

## 2020-03-01 DIAGNOSIS — L89610 Pressure ulcer of right heel, unstageable: Secondary | ICD-10-CM | POA: Diagnosis not present

## 2020-03-01 DIAGNOSIS — I110 Hypertensive heart disease with heart failure: Secondary | ICD-10-CM | POA: Diagnosis not present

## 2020-03-01 DIAGNOSIS — I4819 Other persistent atrial fibrillation: Secondary | ICD-10-CM | POA: Diagnosis not present

## 2020-03-01 DIAGNOSIS — I5042 Chronic combined systolic (congestive) and diastolic (congestive) heart failure: Secondary | ICD-10-CM | POA: Diagnosis not present

## 2020-03-01 DIAGNOSIS — I2510 Atherosclerotic heart disease of native coronary artery without angina pectoris: Secondary | ICD-10-CM | POA: Diagnosis not present

## 2020-03-01 DIAGNOSIS — Z48815 Encounter for surgical aftercare following surgery on the digestive system: Secondary | ICD-10-CM | POA: Diagnosis not present

## 2020-03-09 DIAGNOSIS — I5042 Chronic combined systolic (congestive) and diastolic (congestive) heart failure: Secondary | ICD-10-CM | POA: Diagnosis not present

## 2020-03-09 DIAGNOSIS — Z48815 Encounter for surgical aftercare following surgery on the digestive system: Secondary | ICD-10-CM | POA: Diagnosis not present

## 2020-03-09 DIAGNOSIS — I2510 Atherosclerotic heart disease of native coronary artery without angina pectoris: Secondary | ICD-10-CM | POA: Diagnosis not present

## 2020-03-09 DIAGNOSIS — I4819 Other persistent atrial fibrillation: Secondary | ICD-10-CM | POA: Diagnosis not present

## 2020-03-09 DIAGNOSIS — I110 Hypertensive heart disease with heart failure: Secondary | ICD-10-CM | POA: Diagnosis not present

## 2020-03-09 DIAGNOSIS — L89610 Pressure ulcer of right heel, unstageable: Secondary | ICD-10-CM | POA: Diagnosis not present

## 2020-03-15 ENCOUNTER — Telehealth: Payer: Self-pay | Admitting: Interventional Cardiology

## 2020-03-15 DIAGNOSIS — I2510 Atherosclerotic heart disease of native coronary artery without angina pectoris: Secondary | ICD-10-CM | POA: Diagnosis not present

## 2020-03-15 DIAGNOSIS — Z48815 Encounter for surgical aftercare following surgery on the digestive system: Secondary | ICD-10-CM | POA: Diagnosis not present

## 2020-03-15 DIAGNOSIS — I110 Hypertensive heart disease with heart failure: Secondary | ICD-10-CM | POA: Diagnosis not present

## 2020-03-15 DIAGNOSIS — I5042 Chronic combined systolic (congestive) and diastolic (congestive) heart failure: Secondary | ICD-10-CM | POA: Diagnosis not present

## 2020-03-15 DIAGNOSIS — L89610 Pressure ulcer of right heel, unstageable: Secondary | ICD-10-CM | POA: Diagnosis not present

## 2020-03-15 DIAGNOSIS — I4819 Other persistent atrial fibrillation: Secondary | ICD-10-CM | POA: Diagnosis not present

## 2020-03-15 MED ORDER — AMOXICILLIN 500 MG PO CAPS
ORAL_CAPSULE | ORAL | 3 refills | Status: DC
Start: 2020-03-15 — End: 2022-12-08

## 2020-03-15 NOTE — Telephone Encounter (Signed)
1. What dental office are you calling from? Goes to Dr. Burnett Sheng [facility]ist Office   2. What is your office phone number? [phone]  3. What is your fax number? [phone]  4. What type of procedure is the patient having performed? Upper molar pulled     5. What date is procedure scheduled or is the patient there now? TBD  (if the patient is at the dentist's office question goes to their cardiologist if he/she is in the office.  If not, question should go to the DOD).   6. What is your question (ex. Antibiotics prior to procedure, holding medication-we need to know how long dentist wants pt to hold med)? How long does Eliquis need to be held  Wife is calling, not dentist office.

## 2020-03-15 NOTE — Telephone Encounter (Signed)
I have called and confirmed single extraction of a upper molar. Will forward to our clinic pharmacist to review. Patient has h/o CAD (most recent cath in Apr 2021, medical therapy, no stent was placed), h/o atrial flutter on Eliquis and AVR.   Unclear what is the bleeding risk since a molar is being extracted. Will also need SBE prophylaxis.

## 2020-03-15 NOTE — Telephone Encounter (Signed)
Both the pt and his wife are aware he will not need to hold Eliquis for 1 tooth extraction. Pt and his wife are aware also of SBE has been called into [facility]. I have gone over the instructions for Amoxicillin. Both the pt and his wife have given verbal understanding to plan of care. I will fax these notes to Dr. Burnett Sheng. Will remove from the pre op call back pool.

## 2020-03-15 NOTE — Telephone Encounter (Signed)
No need to hold Eliquis for single tooth extraction. He will need SBE prophylaxis with amoxicillin 2g 30-60 min prior to procedure. I have sent rx to [facility]. Please advise pt on call back

## 2020-03-22 DIAGNOSIS — I5042 Chronic combined systolic (congestive) and diastolic (congestive) heart failure: Secondary | ICD-10-CM | POA: Diagnosis not present

## 2020-03-22 DIAGNOSIS — I110 Hypertensive heart disease with heart failure: Secondary | ICD-10-CM | POA: Diagnosis not present

## 2020-03-22 DIAGNOSIS — I2510 Atherosclerotic heart disease of native coronary artery without angina pectoris: Secondary | ICD-10-CM | POA: Diagnosis not present

## 2020-03-22 DIAGNOSIS — Z48815 Encounter for surgical aftercare following surgery on the digestive system: Secondary | ICD-10-CM | POA: Diagnosis not present

## 2020-03-22 DIAGNOSIS — I4819 Other persistent atrial fibrillation: Secondary | ICD-10-CM | POA: Diagnosis not present

## 2020-03-22 DIAGNOSIS — L89610 Pressure ulcer of right heel, unstageable: Secondary | ICD-10-CM | POA: Diagnosis not present

## 2020-03-25 DIAGNOSIS — I4819 Other persistent atrial fibrillation: Secondary | ICD-10-CM | POA: Diagnosis not present

## 2020-03-25 DIAGNOSIS — L89610 Pressure ulcer of right heel, unstageable: Secondary | ICD-10-CM | POA: Diagnosis not present

## 2020-03-25 DIAGNOSIS — Z952 Presence of prosthetic heart valve: Secondary | ICD-10-CM | POA: Diagnosis not present

## 2020-03-25 DIAGNOSIS — I5042 Chronic combined systolic (congestive) and diastolic (congestive) heart failure: Secondary | ICD-10-CM | POA: Diagnosis not present

## 2020-03-25 DIAGNOSIS — E785 Hyperlipidemia, unspecified: Secondary | ICD-10-CM | POA: Diagnosis not present

## 2020-03-25 DIAGNOSIS — I110 Hypertensive heart disease with heart failure: Secondary | ICD-10-CM | POA: Diagnosis not present

## 2020-03-25 DIAGNOSIS — I2510 Atherosclerotic heart disease of native coronary artery without angina pectoris: Secondary | ICD-10-CM | POA: Diagnosis not present

## 2020-03-25 DIAGNOSIS — Z87891 Personal history of nicotine dependence: Secondary | ICD-10-CM | POA: Diagnosis not present

## 2020-03-25 DIAGNOSIS — Z48815 Encounter for surgical aftercare following surgery on the digestive system: Secondary | ICD-10-CM | POA: Diagnosis not present

## 2020-03-25 DIAGNOSIS — Z7901 Long term (current) use of anticoagulants: Secondary | ICD-10-CM | POA: Diagnosis not present

## 2020-03-30 DIAGNOSIS — I2510 Atherosclerotic heart disease of native coronary artery without angina pectoris: Secondary | ICD-10-CM | POA: Diagnosis not present

## 2020-03-30 DIAGNOSIS — I110 Hypertensive heart disease with heart failure: Secondary | ICD-10-CM | POA: Diagnosis not present

## 2020-03-30 DIAGNOSIS — I4819 Other persistent atrial fibrillation: Secondary | ICD-10-CM | POA: Diagnosis not present

## 2020-03-30 DIAGNOSIS — Z48815 Encounter for surgical aftercare following surgery on the digestive system: Secondary | ICD-10-CM | POA: Diagnosis not present

## 2020-03-30 DIAGNOSIS — L89610 Pressure ulcer of right heel, unstageable: Secondary | ICD-10-CM | POA: Diagnosis not present

## 2020-03-30 DIAGNOSIS — I5042 Chronic combined systolic (congestive) and diastolic (congestive) heart failure: Secondary | ICD-10-CM | POA: Diagnosis not present

## 2020-04-05 DIAGNOSIS — L89610 Pressure ulcer of right heel, unstageable: Secondary | ICD-10-CM | POA: Diagnosis not present

## 2020-04-05 DIAGNOSIS — I5042 Chronic combined systolic (congestive) and diastolic (congestive) heart failure: Secondary | ICD-10-CM | POA: Diagnosis not present

## 2020-04-05 DIAGNOSIS — I110 Hypertensive heart disease with heart failure: Secondary | ICD-10-CM | POA: Diagnosis not present

## 2020-04-05 DIAGNOSIS — I4819 Other persistent atrial fibrillation: Secondary | ICD-10-CM | POA: Diagnosis not present

## 2020-04-05 DIAGNOSIS — I2510 Atherosclerotic heart disease of native coronary artery without angina pectoris: Secondary | ICD-10-CM | POA: Diagnosis not present

## 2020-04-05 DIAGNOSIS — Z48815 Encounter for surgical aftercare following surgery on the digestive system: Secondary | ICD-10-CM | POA: Diagnosis not present

## 2020-04-12 DIAGNOSIS — I110 Hypertensive heart disease with heart failure: Secondary | ICD-10-CM | POA: Diagnosis not present

## 2020-04-12 DIAGNOSIS — I2510 Atherosclerotic heart disease of native coronary artery without angina pectoris: Secondary | ICD-10-CM | POA: Diagnosis not present

## 2020-04-12 DIAGNOSIS — I5042 Chronic combined systolic (congestive) and diastolic (congestive) heart failure: Secondary | ICD-10-CM | POA: Diagnosis not present

## 2020-04-12 DIAGNOSIS — Z48815 Encounter for surgical aftercare following surgery on the digestive system: Secondary | ICD-10-CM | POA: Diagnosis not present

## 2020-04-12 DIAGNOSIS — I4819 Other persistent atrial fibrillation: Secondary | ICD-10-CM | POA: Diagnosis not present

## 2020-04-12 DIAGNOSIS — L89610 Pressure ulcer of right heel, unstageable: Secondary | ICD-10-CM | POA: Diagnosis not present

## 2020-04-19 DIAGNOSIS — I2510 Atherosclerotic heart disease of native coronary artery without angina pectoris: Secondary | ICD-10-CM | POA: Diagnosis not present

## 2020-04-19 DIAGNOSIS — I110 Hypertensive heart disease with heart failure: Secondary | ICD-10-CM | POA: Diagnosis not present

## 2020-04-19 DIAGNOSIS — I4819 Other persistent atrial fibrillation: Secondary | ICD-10-CM | POA: Diagnosis not present

## 2020-04-19 DIAGNOSIS — L89610 Pressure ulcer of right heel, unstageable: Secondary | ICD-10-CM | POA: Diagnosis not present

## 2020-04-19 DIAGNOSIS — I5042 Chronic combined systolic (congestive) and diastolic (congestive) heart failure: Secondary | ICD-10-CM | POA: Diagnosis not present

## 2020-04-19 DIAGNOSIS — Z48815 Encounter for surgical aftercare following surgery on the digestive system: Secondary | ICD-10-CM | POA: Diagnosis not present

## 2020-04-24 DIAGNOSIS — I4819 Other persistent atrial fibrillation: Secondary | ICD-10-CM | POA: Diagnosis not present

## 2020-04-24 DIAGNOSIS — I110 Hypertensive heart disease with heart failure: Secondary | ICD-10-CM | POA: Diagnosis not present

## 2020-04-24 DIAGNOSIS — E785 Hyperlipidemia, unspecified: Secondary | ICD-10-CM | POA: Diagnosis not present

## 2020-04-24 DIAGNOSIS — Z87891 Personal history of nicotine dependence: Secondary | ICD-10-CM | POA: Diagnosis not present

## 2020-04-24 DIAGNOSIS — L89610 Pressure ulcer of right heel, unstageable: Secondary | ICD-10-CM | POA: Diagnosis not present

## 2020-04-24 DIAGNOSIS — I5042 Chronic combined systolic (congestive) and diastolic (congestive) heart failure: Secondary | ICD-10-CM | POA: Diagnosis not present

## 2020-04-24 DIAGNOSIS — Z48815 Encounter for surgical aftercare following surgery on the digestive system: Secondary | ICD-10-CM | POA: Diagnosis not present

## 2020-04-24 DIAGNOSIS — I2510 Atherosclerotic heart disease of native coronary artery without angina pectoris: Secondary | ICD-10-CM | POA: Diagnosis not present

## 2020-04-24 DIAGNOSIS — Z7901 Long term (current) use of anticoagulants: Secondary | ICD-10-CM | POA: Diagnosis not present

## 2020-04-24 DIAGNOSIS — Z952 Presence of prosthetic heart valve: Secondary | ICD-10-CM | POA: Diagnosis not present

## 2020-04-26 DIAGNOSIS — I2510 Atherosclerotic heart disease of native coronary artery without angina pectoris: Secondary | ICD-10-CM | POA: Diagnosis not present

## 2020-04-26 DIAGNOSIS — L89610 Pressure ulcer of right heel, unstageable: Secondary | ICD-10-CM | POA: Diagnosis not present

## 2020-04-26 DIAGNOSIS — I110 Hypertensive heart disease with heart failure: Secondary | ICD-10-CM | POA: Diagnosis not present

## 2020-04-26 DIAGNOSIS — I4819 Other persistent atrial fibrillation: Secondary | ICD-10-CM | POA: Diagnosis not present

## 2020-04-26 DIAGNOSIS — Z48815 Encounter for surgical aftercare following surgery on the digestive system: Secondary | ICD-10-CM | POA: Diagnosis not present

## 2020-04-26 DIAGNOSIS — I5042 Chronic combined systolic (congestive) and diastolic (congestive) heart failure: Secondary | ICD-10-CM | POA: Diagnosis not present

## 2020-05-04 DIAGNOSIS — I110 Hypertensive heart disease with heart failure: Secondary | ICD-10-CM | POA: Diagnosis not present

## 2020-05-04 DIAGNOSIS — I4819 Other persistent atrial fibrillation: Secondary | ICD-10-CM | POA: Diagnosis not present

## 2020-05-04 DIAGNOSIS — Z48815 Encounter for surgical aftercare following surgery on the digestive system: Secondary | ICD-10-CM | POA: Diagnosis not present

## 2020-05-04 DIAGNOSIS — I2510 Atherosclerotic heart disease of native coronary artery without angina pectoris: Secondary | ICD-10-CM | POA: Diagnosis not present

## 2020-05-04 DIAGNOSIS — L89610 Pressure ulcer of right heel, unstageable: Secondary | ICD-10-CM | POA: Diagnosis not present

## 2020-05-04 DIAGNOSIS — I5042 Chronic combined systolic (congestive) and diastolic (congestive) heart failure: Secondary | ICD-10-CM | POA: Diagnosis not present

## 2020-05-10 DIAGNOSIS — L89610 Pressure ulcer of right heel, unstageable: Secondary | ICD-10-CM | POA: Diagnosis not present

## 2020-05-10 DIAGNOSIS — I5042 Chronic combined systolic (congestive) and diastolic (congestive) heart failure: Secondary | ICD-10-CM | POA: Diagnosis not present

## 2020-05-10 DIAGNOSIS — I110 Hypertensive heart disease with heart failure: Secondary | ICD-10-CM | POA: Diagnosis not present

## 2020-05-10 DIAGNOSIS — Z48815 Encounter for surgical aftercare following surgery on the digestive system: Secondary | ICD-10-CM | POA: Diagnosis not present

## 2020-05-10 DIAGNOSIS — I4819 Other persistent atrial fibrillation: Secondary | ICD-10-CM | POA: Diagnosis not present

## 2020-05-10 DIAGNOSIS — I2510 Atherosclerotic heart disease of native coronary artery without angina pectoris: Secondary | ICD-10-CM | POA: Diagnosis not present

## 2020-05-17 DIAGNOSIS — I4819 Other persistent atrial fibrillation: Secondary | ICD-10-CM | POA: Diagnosis not present

## 2020-05-17 DIAGNOSIS — I5042 Chronic combined systolic (congestive) and diastolic (congestive) heart failure: Secondary | ICD-10-CM | POA: Diagnosis not present

## 2020-05-17 DIAGNOSIS — L89610 Pressure ulcer of right heel, unstageable: Secondary | ICD-10-CM | POA: Diagnosis not present

## 2020-05-17 DIAGNOSIS — Z48815 Encounter for surgical aftercare following surgery on the digestive system: Secondary | ICD-10-CM | POA: Diagnosis not present

## 2020-05-17 DIAGNOSIS — I110 Hypertensive heart disease with heart failure: Secondary | ICD-10-CM | POA: Diagnosis not present

## 2020-05-17 DIAGNOSIS — I2510 Atherosclerotic heart disease of native coronary artery without angina pectoris: Secondary | ICD-10-CM | POA: Diagnosis not present

## 2020-05-24 DIAGNOSIS — L89610 Pressure ulcer of right heel, unstageable: Secondary | ICD-10-CM | POA: Diagnosis not present

## 2020-05-24 DIAGNOSIS — I5042 Chronic combined systolic (congestive) and diastolic (congestive) heart failure: Secondary | ICD-10-CM | POA: Diagnosis not present

## 2020-05-24 DIAGNOSIS — I110 Hypertensive heart disease with heart failure: Secondary | ICD-10-CM | POA: Diagnosis not present

## 2020-05-24 DIAGNOSIS — Z952 Presence of prosthetic heart valve: Secondary | ICD-10-CM | POA: Diagnosis not present

## 2020-05-24 DIAGNOSIS — Z48815 Encounter for surgical aftercare following surgery on the digestive system: Secondary | ICD-10-CM | POA: Diagnosis not present

## 2020-05-24 DIAGNOSIS — I2510 Atherosclerotic heart disease of native coronary artery without angina pectoris: Secondary | ICD-10-CM | POA: Diagnosis not present

## 2020-05-24 DIAGNOSIS — I4819 Other persistent atrial fibrillation: Secondary | ICD-10-CM | POA: Diagnosis not present

## 2020-05-24 DIAGNOSIS — Z87891 Personal history of nicotine dependence: Secondary | ICD-10-CM | POA: Diagnosis not present

## 2020-05-24 DIAGNOSIS — Z7901 Long term (current) use of anticoagulants: Secondary | ICD-10-CM | POA: Diagnosis not present

## 2020-05-24 DIAGNOSIS — E785 Hyperlipidemia, unspecified: Secondary | ICD-10-CM | POA: Diagnosis not present

## 2020-05-31 DIAGNOSIS — Z48815 Encounter for surgical aftercare following surgery on the digestive system: Secondary | ICD-10-CM | POA: Diagnosis not present

## 2020-05-31 DIAGNOSIS — I110 Hypertensive heart disease with heart failure: Secondary | ICD-10-CM | POA: Diagnosis not present

## 2020-05-31 DIAGNOSIS — I2510 Atherosclerotic heart disease of native coronary artery without angina pectoris: Secondary | ICD-10-CM | POA: Diagnosis not present

## 2020-05-31 DIAGNOSIS — I5042 Chronic combined systolic (congestive) and diastolic (congestive) heart failure: Secondary | ICD-10-CM | POA: Diagnosis not present

## 2020-05-31 DIAGNOSIS — L89610 Pressure ulcer of right heel, unstageable: Secondary | ICD-10-CM | POA: Diagnosis not present

## 2020-05-31 DIAGNOSIS — I4819 Other persistent atrial fibrillation: Secondary | ICD-10-CM | POA: Diagnosis not present

## 2020-06-08 DIAGNOSIS — I5042 Chronic combined systolic (congestive) and diastolic (congestive) heart failure: Secondary | ICD-10-CM | POA: Diagnosis not present

## 2020-06-08 DIAGNOSIS — I2510 Atherosclerotic heart disease of native coronary artery without angina pectoris: Secondary | ICD-10-CM | POA: Diagnosis not present

## 2020-06-08 DIAGNOSIS — Z48815 Encounter for surgical aftercare following surgery on the digestive system: Secondary | ICD-10-CM | POA: Diagnosis not present

## 2020-06-08 DIAGNOSIS — I4819 Other persistent atrial fibrillation: Secondary | ICD-10-CM | POA: Diagnosis not present

## 2020-06-08 DIAGNOSIS — L89610 Pressure ulcer of right heel, unstageable: Secondary | ICD-10-CM | POA: Diagnosis not present

## 2020-06-08 DIAGNOSIS — I110 Hypertensive heart disease with heart failure: Secondary | ICD-10-CM | POA: Diagnosis not present

## 2020-06-14 DIAGNOSIS — I110 Hypertensive heart disease with heart failure: Secondary | ICD-10-CM | POA: Diagnosis not present

## 2020-06-14 DIAGNOSIS — Z48815 Encounter for surgical aftercare following surgery on the digestive system: Secondary | ICD-10-CM | POA: Diagnosis not present

## 2020-06-14 DIAGNOSIS — I4819 Other persistent atrial fibrillation: Secondary | ICD-10-CM | POA: Diagnosis not present

## 2020-06-14 DIAGNOSIS — I2510 Atherosclerotic heart disease of native coronary artery without angina pectoris: Secondary | ICD-10-CM | POA: Diagnosis not present

## 2020-06-14 DIAGNOSIS — L89610 Pressure ulcer of right heel, unstageable: Secondary | ICD-10-CM | POA: Diagnosis not present

## 2020-06-14 DIAGNOSIS — I5042 Chronic combined systolic (congestive) and diastolic (congestive) heart failure: Secondary | ICD-10-CM | POA: Diagnosis not present

## 2020-06-21 ENCOUNTER — Other Ambulatory Visit: Payer: Self-pay | Admitting: Interventional Cardiology

## 2020-06-21 DIAGNOSIS — I5042 Chronic combined systolic (congestive) and diastolic (congestive) heart failure: Secondary | ICD-10-CM | POA: Diagnosis not present

## 2020-06-21 DIAGNOSIS — Z48815 Encounter for surgical aftercare following surgery on the digestive system: Secondary | ICD-10-CM | POA: Diagnosis not present

## 2020-06-21 DIAGNOSIS — I4819 Other persistent atrial fibrillation: Secondary | ICD-10-CM | POA: Diagnosis not present

## 2020-06-21 DIAGNOSIS — I110 Hypertensive heart disease with heart failure: Secondary | ICD-10-CM | POA: Diagnosis not present

## 2020-06-21 DIAGNOSIS — I2510 Atherosclerotic heart disease of native coronary artery without angina pectoris: Secondary | ICD-10-CM | POA: Diagnosis not present

## 2020-06-21 DIAGNOSIS — L89610 Pressure ulcer of right heel, unstageable: Secondary | ICD-10-CM | POA: Diagnosis not present

## 2020-06-23 DIAGNOSIS — I4819 Other persistent atrial fibrillation: Secondary | ICD-10-CM | POA: Diagnosis not present

## 2020-06-23 DIAGNOSIS — Z952 Presence of prosthetic heart valve: Secondary | ICD-10-CM | POA: Diagnosis not present

## 2020-06-23 DIAGNOSIS — Z48815 Encounter for surgical aftercare following surgery on the digestive system: Secondary | ICD-10-CM | POA: Diagnosis not present

## 2020-06-23 DIAGNOSIS — E785 Hyperlipidemia, unspecified: Secondary | ICD-10-CM | POA: Diagnosis not present

## 2020-06-23 DIAGNOSIS — I110 Hypertensive heart disease with heart failure: Secondary | ICD-10-CM | POA: Diagnosis not present

## 2020-06-23 DIAGNOSIS — I2510 Atherosclerotic heart disease of native coronary artery without angina pectoris: Secondary | ICD-10-CM | POA: Diagnosis not present

## 2020-06-23 DIAGNOSIS — L89610 Pressure ulcer of right heel, unstageable: Secondary | ICD-10-CM | POA: Diagnosis not present

## 2020-06-23 DIAGNOSIS — I5042 Chronic combined systolic (congestive) and diastolic (congestive) heart failure: Secondary | ICD-10-CM | POA: Diagnosis not present

## 2020-06-23 DIAGNOSIS — Z7901 Long term (current) use of anticoagulants: Secondary | ICD-10-CM | POA: Diagnosis not present

## 2020-06-23 DIAGNOSIS — Z87891 Personal history of nicotine dependence: Secondary | ICD-10-CM | POA: Diagnosis not present

## 2020-06-28 DIAGNOSIS — I110 Hypertensive heart disease with heart failure: Secondary | ICD-10-CM | POA: Diagnosis not present

## 2020-06-28 DIAGNOSIS — I2510 Atherosclerotic heart disease of native coronary artery without angina pectoris: Secondary | ICD-10-CM | POA: Diagnosis not present

## 2020-06-28 DIAGNOSIS — Z48815 Encounter for surgical aftercare following surgery on the digestive system: Secondary | ICD-10-CM | POA: Diagnosis not present

## 2020-06-28 DIAGNOSIS — L89610 Pressure ulcer of right heel, unstageable: Secondary | ICD-10-CM | POA: Diagnosis not present

## 2020-06-28 DIAGNOSIS — I4819 Other persistent atrial fibrillation: Secondary | ICD-10-CM | POA: Diagnosis not present

## 2020-06-28 DIAGNOSIS — I5042 Chronic combined systolic (congestive) and diastolic (congestive) heart failure: Secondary | ICD-10-CM | POA: Diagnosis not present

## 2020-07-06 ENCOUNTER — Other Ambulatory Visit: Payer: Self-pay | Admitting: Interventional Cardiology

## 2020-07-07 DIAGNOSIS — I4819 Other persistent atrial fibrillation: Secondary | ICD-10-CM | POA: Diagnosis not present

## 2020-07-07 DIAGNOSIS — I2510 Atherosclerotic heart disease of native coronary artery without angina pectoris: Secondary | ICD-10-CM | POA: Diagnosis not present

## 2020-07-07 DIAGNOSIS — Z48815 Encounter for surgical aftercare following surgery on the digestive system: Secondary | ICD-10-CM | POA: Diagnosis not present

## 2020-07-07 DIAGNOSIS — I110 Hypertensive heart disease with heart failure: Secondary | ICD-10-CM | POA: Diagnosis not present

## 2020-07-07 DIAGNOSIS — L89610 Pressure ulcer of right heel, unstageable: Secondary | ICD-10-CM | POA: Diagnosis not present

## 2020-07-07 DIAGNOSIS — I5042 Chronic combined systolic (congestive) and diastolic (congestive) heart failure: Secondary | ICD-10-CM | POA: Diagnosis not present

## 2020-07-12 DIAGNOSIS — I110 Hypertensive heart disease with heart failure: Secondary | ICD-10-CM | POA: Diagnosis not present

## 2020-07-12 DIAGNOSIS — I4819 Other persistent atrial fibrillation: Secondary | ICD-10-CM | POA: Diagnosis not present

## 2020-07-12 DIAGNOSIS — Z48815 Encounter for surgical aftercare following surgery on the digestive system: Secondary | ICD-10-CM | POA: Diagnosis not present

## 2020-07-12 DIAGNOSIS — I2510 Atherosclerotic heart disease of native coronary artery without angina pectoris: Secondary | ICD-10-CM | POA: Diagnosis not present

## 2020-07-12 DIAGNOSIS — L89610 Pressure ulcer of right heel, unstageable: Secondary | ICD-10-CM | POA: Diagnosis not present

## 2020-07-12 DIAGNOSIS — I5042 Chronic combined systolic (congestive) and diastolic (congestive) heart failure: Secondary | ICD-10-CM | POA: Diagnosis not present

## 2020-07-12 NOTE — Progress Notes (Unsigned)
Cardiology Office Note   Date:  07/13/2020   ID:  [name], [name] 1941-05-24, MRN [id number]  PCP:  Street, [name], MD    No chief complaint on file.  CAD  Wt Readings from Last 3 Encounters:  07/13/20 182 lb 12.8 oz (82.9 kg)  02/19/20 170 lb (77.1 kg)  01/15/20 170 lb 3.2 oz (77.2 kg)       History of Present Illness: [name] [name] is a 80 y.o. male   with h/o aortic disection, CAD - DES to circumflex inSept2011. 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 He was admitted to the hospital on 10/05/13 for urosepsis and was on the ventilator for 2 days. Also has thoracentesis for pleural effusion (1200cc).   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 He had a retinal vein thrombosis apparently, per the patient's report.  In 4/21, he was admitted with heart failure. Echo showed: "Left ventricular ejection fraction, by estimation, is 25 to 30%.  The left ventricle has severely decreased function. The left ventricle  demonstrates global hypokinesis."    Cath showed: "Chronic total occlusion of the mid right coronary, collateralized right to right and left to right.  Widely patent left main.  Widely patent LAD with moderate luminal irregularities proximal to distal.  Widely patent stent in the proximal to mid circumflex with up to 50% narrowing distal to the stent.  Moderate pulmonary hypertension. Mean pulmonary artery pressure 35 mmHg.  Mean pulmonary capillary wedge pressure 32 mmHg.  RECOMMENDATIONS:  Left ventricular systolic dysfunction is not on the basis of coronary disease. 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  More recently, history includes: "incarcerated left inguinal hernia s/p repair on 12/02/2019, presents to ED with abd pain.  He was found to have partial colonic obstruction. General surgery consulted. Cardiology consulted for atrial flutter and pre op clearance. Patient Underwent LAP Repair of the recurrent inguinal hernia by Dr Andrey Campanile on 12/18/19."  IV Amio was used for rate controlled while he was NPO.   He had a seroma that was draining.  This resolved.  HH checking HR and it has been normal.  Has a pressure sore area  On his heel.  Improving with ointment.    Son passed away in 06-21-2023 after a bleeding episode after cath/PCI.  He also had Addisons.  He was in [place].  Denies : Chest pain. Dizziness. Leg edema. Nitroglycerin use. Orthopnea. Palpitations. Paroxysmal nocturnal dyspnea. Shortness of breath. Syncope.   Walking inside is most strenuous activity.           Past Medical History:  Diagnosis Date  . Acute on chronic diastolic heart failure (HCC) 08/22/2013  . Acute on chronic respiratory failure with hypoxia (HCC) 10/03/2019  . Acute on chronic systolic heart failure (HCC) 05/11/2013   EF from 35% to 50-55% on 6/14 ECHO.   Marland Kitchen Acute respiratory failure with hypoxia (HCC) 10/07/2013  . AKI (acute kidney injury) (HCC) 12/17/2019  . Aortic atherosclerosis (HCC) 05/05/2013  . Aortic dissection (HCC) 05/04/2013    Descending only, had aneurysm of ascending but no dissection   02/12/2014 Stable aortic stent graft over the aortic arch and descending thoracic aorta with significant reduction in the mural thrombus of the native aneurysm sac. No evidence of dissection or endoleak.  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 No evidence of abdominal aortic dissection.  Occluded proximal left subclav  . Aortic valve insufficiency   . Arthritis    "probably in my knees before they replaced them" (01/28/2015)  . Atrial fibrillation (HCC) 05/21/2013  . Atrial flutter (HCC)   . Bradycardia 08/23/2013  . CAD (coronary artery disease)   . Chest pain 01/28/2015  . CHF (congestive heart failure) (HCC)    . Chronic diastolic heart failure (HCC) 01/28/2015  . Descending aortic aneurysm (HCC)   . DVT (deep venous thrombosis) (HCC)    ?LLE post knee surgery  . Dyspnea 09/2019  . HCAP (healthcare-associated pneumonia) 10/11/2013  . History of blood transfusion    "after one of my knee surgeries"  . HTN (hypertension)   . Hyperlipidemia   . Hypotension 12/17/2019  . Incarcerated left inguinal hernia s/p repair 12/02/2019 12/01/2019  . Insomnia 09/04/2013  . Multiple fractures of ribs, right side, init for clos fx 09/04/2019  . Near syncope 01/28/2015  . Pressure injury of skin 10/06/2019  . 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 Recurrent UTI 10/06/2013  . Shock circulatory (HCC) 10/07/2013  . Thoracic aneurysm   . Tobacco abuse   . Urinary retention 10/06/2013  . UTI (urinary tract infection) 10/06/2013    Past Surgical History:  Procedure Laterality Date  . AORTIC VALVE REPLACEMENT  02/19/2009  . AORTO-FEMORAL BYPASS GRAFT  03/2010   ascending aortic and arch aneurysm repair/notes 04/09/2009  . CARDIAC VALVE REPLACEMENT    . CAROTID-SUBCLAVIAN BYPASS GRAFT Left 08/26/2013   Procedure: BYPASS GRAFT CAROTID-SUBCLAVIAN;  Surgeon: Nada Libman, MD;  Location: [facility] OR;  Service: Vascular;  Laterality: Left;  . CATARACT EXTRACTION W/ INTRAOCULAR LENS  IMPLANT, BILATERAL Bilateral   . ENDOVASCULAR STENT INSERTION N/A 08/26/2013   Procedure:  THORACIC STENT GRAFT INSERTION;  Surgeon: Nada Libman, MD;  Location: [facility] OR;  Service: Vascular;  Laterality: N/A;  . EYE SURGERY    . INGUINAL HERNIA REPAIR Left 12/02/2019   Procedure: REPAIR LEFT INGUINAL HERNIA WITH MESH;  Surgeon: Violeta Gelinas, MD;  Location: [facility] OR;  Service: General;  Laterality: Left;  . INGUINAL HERNIA REPAIR Left 12/18/2019   Procedure: LAPAROSCOPIC ASSISTED REPAIR OF RECURRENT INCARCERATED LEFT INGUINAL HERNIA WITH MESH; LAPAOSCOPIC REPAIR OF SEROSAL TEAR;  Surgeon: Gaynelle Adu, MD;  Location: [facility] OR;  Service: General;  Laterality: Left;  . INSERTION OF MESH Left 12/02/2019   Procedure: INSERTION OF MESH;  Surgeon: Violeta Gelinas, MD;  Location: [facility] OR;  Service: General;  Laterality: Left;  . JOINT REPLACEMENT    . KNEE ARTHROSCOPY Bilateral   . REPLACEMENT TOTAL KNEE Bilateral   . right groin lymphocele Right 03/29/2009  . RIGHT/LEFT HEART CATH AND CORONARY ANGIOGRAPHY N/A 10/06/2019   Procedure: RIGHT/LEFT HEART CATH AND CORONARY ANGIOGRAPHY;  Surgeon: Lyn Records, MD;  Location: [facility] INVASIVE CV LAB;  Service: Cardiovascular;  Laterality: N/A;  . THORACENTESIS  2010 X 2  . TONSILLECTOMY       Current Outpatient Medications  Medication Sig Dispense Refill  . acetaminophen (TYLENOL) 500 MG tablet Take 2 tablets (1,000 mg total) by mouth every 6 (six) hours as needed. 30 tablet 0  . amiodarone (PACERONE) 200 MG tablet Take 1 tablet (200 mg total) by mouth daily. PLEASE KEEP SCHEDULED FOLLOW UP FOR FURTHER REFILLS 30 tablet 0  . amoxicillin (AMOXIL) 500 MG capsule Take 4 capsules by mouth 30-60 min prior to  dental procedure. 4 capsule 3  . atorvastatin (LIPITOR) 10 MG tablet Take 1 tablet (10 mg total) by mouth daily. PLEASE KEEP SCHEDULED FOLLOW UP FOR FURTHER REFILLS 30 tablet 0  . ELIQUIS 5 MG TABS tablet Take 1 tablet by mouth twice daily 180 tablet 2  . ferrous sulfate 325 (65 FE) MG tablet Take 1 tablet (325 mg total) by mouth daily with breakfast. 90 tablet 3  . furosemide (LASIX) 40 MG tablet Take 1 tablet (40 mg total) by mouth daily. 30 tablet 1  . lansoprazole (PREVACID) 30 MG capsule Take 30 mg by mouth daily.    Marland Kitchen loratadine (CLARITIN) 10 MG tablet Take 1 tablet (10 mg total) by mouth daily. 30 tablet 11  . losartan (COZAAR) 25 MG tablet Take 1 tablet (25 mg total) by mouth daily. 90 tablet 3  . metoprolol succinate (TOPROL-XL) 100 MG 24 hr tablet Take 1 tablet (100 mg total) by mouth daily. Take with or immediately following a meal. 90 tablet 3   . Multiple Vitamin (MULTIVITAMIN WITH MINERALS) TABS tablet Take 1 tablet by mouth daily.    . potassium chloride SA (KLOR-CON) 20 MEQ tablet Take 2 tablets by mouth once daily 180 tablet 2  . spironolactone (ALDACTONE) 25 MG tablet Take 0.5 tablets (12.5 mg total) by mouth daily. 45 tablet 3   No current facility-administered medications for this visit.    Allergies:   Lortab [hydrocodone-acetaminophen], Morphine and related, and Other    Social History:  The patient  reports that he quit smoking about 46 years ago. His smoking use included cigarettes. He smoked 0.00 packs per day. He has never used smokeless tobacco. He reports that he does not drink alcohol and does not use drugs.   Family History:  The patient's family history includes Aortic aneurysm in his father; Celiac disease in his daughter; Hypertension in his mother.    ROS:  Please see the history of present illness.   Otherwise, review of systems are positive for heel sore.   All other systems are reviewed and negative.    PHYSICAL EXAM: VS:  BP 130/80   Pulse 66   Ht 5\' 10"  (1.778 m)   Wt 182 lb 12.8 oz (82.9 kg)   SpO2 94%   BMI 26.23 kg/m  , BMI Body mass index is 26.23 kg/m. GEN: Well nourished, well developed, in no acute distress  HEENT: normal  Neck: no JVD, carotid bruits, or masses Cardiac: irregularly irregular; no murmurs, rubs, or gallops,no edema  Respiratory:  clear to auscultation bilaterally, normal work of breathing GI: soft, nontender, nondistended, + BS MS: no deformity or atrophy  Skin: warm and dry, no rash Neuro:  Strength and sensation are intact Psych: euthymic mood, full affect      Recent Labs: 10/04/2019: TSH 1.342 12/16/2019: ALT 24 12/17/2019: B Natriuretic Peptide 587.5 12/21/2019: Magnesium 2.3 12/22/2019: BUN 21; Creatinine, Ser 1.07; Hemoglobin 11.1; Platelets 258; Potassium 3.6; Sodium 144   Lipid Panel    Component Value Date/Time   CHOL 152 10/10/2013 1220   TRIG 116  10/07/2013 0320   HDL 38 (L) 07/29/2010 0210   CHOLHDL 3.6 07/29/2010 0210   VLDL 18 07/29/2010 0210   LDLCALC  07/29/2010 0210    80        Total Cholesterol/HDL:CHD Risk Coronary Heart Disease Risk Table                     Men   Women  1/2  Average Risk   3.4   3.3  Average Risk       5.0   4.4  2 X Average Risk   9.6   7.1  3 X Average Risk  23.4   11.0        Use the calculated Patient Ratio above and the CHD Risk Table to determine the patient's CHD Risk.        ATP III CLASSIFICATION (LDL):  <100     mg/dL   Optimal  366-440  mg/dL   Near or Above                    Optimal  130-159  mg/dL   Borderline  347-425  mg/dL   High  >956     mg/dL   Very High     Other studies Reviewed: Additional studies/ records that were reviewed today with results demonstrating: labs reviewed.   ASSESSMENT AND PLAN:  1. Chronic systolic heart failure: he appears euvolemic.  COntinue current meds.  2. CAD: No angina. Continue aggressive secondary prevention. Check lipids today.  Continue statin.  3. Atrial flutter: Rate controlled. Eliquis for stroke prevention.   4. S/p AVR: SBE prophylaxis.   5. Check CBC, CMet and lipids.  Healthy diet.     Current medicines are reviewed at length with the patient today.  The patient concerns regarding his medicines were addressed.  The following changes have been made:  No change  Labs/ tests ordered today include:   Orders Placed This Encounter  Procedures  . CBC  . Comprehensive metabolic panel  . Lipid panel  . TSH    Recommend 150 minutes/week of aerobic exercise Low fat, low carb, high fiber diet recommended  Disposition:   FU in 6 months   Signed, Lance Muss, MD  07/13/2020 11:22 AM    [facility] [address] [place], [place], [place]  [address] Phone: [phone]; Fax: [phone]

## 2020-07-13 ENCOUNTER — Ambulatory Visit (INDEPENDENT_AMBULATORY_CARE_PROVIDER_SITE_OTHER): Payer: Medicare Other | Admitting: Interventional Cardiology

## 2020-07-13 ENCOUNTER — Encounter: Payer: Self-pay | Admitting: Interventional Cardiology

## 2020-07-13 ENCOUNTER — Other Ambulatory Visit: Payer: Self-pay

## 2020-07-13 VITALS — BP 130/80 | HR 66 | Ht 70.0 in | Wt 182.8 lb

## 2020-07-13 DIAGNOSIS — I480 Paroxysmal atrial fibrillation: Secondary | ICD-10-CM

## 2020-07-13 DIAGNOSIS — I2510 Atherosclerotic heart disease of native coronary artery without angina pectoris: Secondary | ICD-10-CM

## 2020-07-13 DIAGNOSIS — Z7901 Long term (current) use of anticoagulants: Secondary | ICD-10-CM | POA: Diagnosis not present

## 2020-07-13 DIAGNOSIS — Z952 Presence of prosthetic heart valve: Secondary | ICD-10-CM | POA: Diagnosis not present

## 2020-07-13 LAB — COMPREHENSIVE METABOLIC PANEL
ALT: 16 IU/L (ref 0–44)
AST: 23 IU/L (ref 0–40)
Albumin/Globulin Ratio: 1.9 (ref 1.2–2.2)
Albumin: 4.3 g/dL (ref 3.7–4.7)
Alkaline Phosphatase: 65 IU/L (ref 44–121)
BUN/Creatinine Ratio: 26 — ABNORMAL HIGH (ref 10–24)
BUN: 34 mg/dL — ABNORMAL HIGH (ref 8–27)
Bilirubin Total: 0.4 mg/dL (ref 0.0–1.2)
CO2: 26 mmol/L (ref 20–29)
Calcium: 8.6 mg/dL (ref 8.6–10.2)
Chloride: 100 mmol/L (ref 96–106)
Creatinine, Ser: 1.29 mg/dL — ABNORMAL HIGH (ref 0.76–1.27)
GFR calc Af Amer: 60 mL/min/{1.73_m2} (ref >59)
GFR calc non Af Amer: 52 mL/min/{1.73_m2} — ABNORMAL LOW (ref >59)
Globulin, Total: 2.3 g/dL (ref 1.5–4.5)
Glucose: 111 mg/dL — ABNORMAL HIGH (ref 65–99)
Potassium: 5 mmol/L (ref 3.5–5.2)
Sodium: 138 mmol/L (ref 134–144)
Total Protein: 6.6 g/dL (ref 6.0–8.5)

## 2020-07-13 LAB — CBC
Hematocrit: 31.2 % — ABNORMAL LOW (ref 37.5–51.0)
Hemoglobin: 10.5 g/dL — ABNORMAL LOW (ref 13.0–17.7)
MCH: 32.6 pg (ref 26.6–33.0)
MCHC: 33.7 g/dL (ref 31.5–35.7)
MCV: 97 fL (ref 79–97)
Platelets: 205 10*3/uL (ref 150–450)
RBC: 3.22 x10E6/uL — ABNORMAL LOW (ref 4.14–5.80)
RDW: 13.7 % (ref 11.6–15.4)
WBC: 6.1 10*3/uL (ref 3.4–10.8)

## 2020-07-13 LAB — LIPID PANEL
Chol/HDL Ratio: 3.4 ratio (ref 0.0–5.0)
Cholesterol, Total: 155 mg/dL (ref 100–199)
HDL: 46 mg/dL (ref >39)
LDL Chol Calc (NIH): 79 mg/dL (ref 0–99)
Triglycerides: 176 mg/dL — ABNORMAL HIGH (ref 0–149)
VLDL Cholesterol Cal: 30 mg/dL (ref 5–40)

## 2020-07-13 LAB — TSH: TSH: 2.97 u[IU]/mL (ref 0.450–4.500)

## 2020-07-13 NOTE — Patient Instructions (Signed)
Medication Instructions:  Your physician recommends that you continue on your current medications as directed. Please refer to the Current Medication list given to you today.  *If you need a refill on your cardiac medications before your next appointment, please call your pharmacy*   Lab Work: TODAY: CBC, CMET, Lipid panel, and TSH If you have labs (blood work) drawn today and your tests are completely normal, you will receive your results only by: Marland Kitchen MyChart Message (if you have MyChart) OR . A paper copy in the mail If you have any lab test that is abnormal or we need to change your treatment, we will call you to review the results.   Testing/Procedures: NONE   Follow-Up: At [facility], you and your health needs are our priority.  As part of our continuing mission to provide you with exceptional heart care, we have created designated Provider Care Teams.  These Care Teams include your primary Cardiologist (physician) and Advanced Practice Providers (APPs -  Physician Assistants and Nurse Practitioners) who all work together to provide you with the care you need, when you need it.  We recommend signing up for the patient portal called "MyChart".  Sign up information is provided on this After Visit Summary.  MyChart is used to connect with patients for Virtual Visits (Telemedicine).  Patients are able to view lab/test results, encounter notes, upcoming appointments, etc.  Non-urgent messages can be sent to your provider as well.   To learn more about what you can do with MyChart, go to [email or web address].    Your next appointment:   6 month(s)  The format for your next appointment:   In Person  Provider:   You may see Lance Muss, MD or one of the following Advanced Practice Providers on your designated Care Team:    Ronie Spies, PA-C  Jacolyn Reedy, PA-C

## 2020-07-15 ENCOUNTER — Encounter: Payer: Self-pay | Admitting: *Deleted

## 2020-07-19 DIAGNOSIS — I2510 Atherosclerotic heart disease of native coronary artery without angina pectoris: Secondary | ICD-10-CM | POA: Diagnosis not present

## 2020-07-19 DIAGNOSIS — L89610 Pressure ulcer of right heel, unstageable: Secondary | ICD-10-CM | POA: Diagnosis not present

## 2020-07-19 DIAGNOSIS — Z48815 Encounter for surgical aftercare following surgery on the digestive system: Secondary | ICD-10-CM | POA: Diagnosis not present

## 2020-07-19 DIAGNOSIS — I4819 Other persistent atrial fibrillation: Secondary | ICD-10-CM | POA: Diagnosis not present

## 2020-07-19 DIAGNOSIS — I5042 Chronic combined systolic (congestive) and diastolic (congestive) heart failure: Secondary | ICD-10-CM | POA: Diagnosis not present

## 2020-07-19 DIAGNOSIS — I110 Hypertensive heart disease with heart failure: Secondary | ICD-10-CM | POA: Diagnosis not present

## 2020-07-22 ENCOUNTER — Other Ambulatory Visit: Payer: Self-pay | Admitting: Interventional Cardiology

## 2020-07-22 MED ORDER — FERROUS SULFATE 325 (65 FE) MG PO TABS: 325.0000 mg | ORAL_TABLET | Freq: Every day | ORAL | 3 refills | Status: DC

## 2020-07-22 MED ORDER — FUROSEMIDE 40 MG PO TABS: 40.0000 mg | ORAL_TABLET | Freq: Every day | ORAL | 3 refills | Status: DC

## 2020-07-22 MED ORDER — ATORVASTATIN CALCIUM 10 MG PO TABS: 10.0000 mg | ORAL_TABLET | Freq: Every day | ORAL | 3 refills | Status: DC

## 2020-07-22 MED ORDER — AMIODARONE HCL 200 MG PO TABS: 200.0000 mg | ORAL_TABLET | Freq: Every day | ORAL | 3 refills | Status: DC

## 2020-07-22 MED ORDER — POTASSIUM CHLORIDE CRYS ER 20 MEQ PO TBCR: 40.0000 meq | EXTENDED_RELEASE_TABLET | Freq: Every day | ORAL | 3 refills | Status: DC

## 2020-07-22 MED ORDER — SPIRONOLACTONE 25 MG PO TABS: 12.5000 mg | ORAL_TABLET | Freq: Every day | ORAL | 3 refills | Status: DC

## 2020-07-22 MED ORDER — APIXABAN 5 MG PO TABS: 5.0000 mg | ORAL_TABLET | Freq: Two times a day (BID) | ORAL | 2 refills | Status: DC

## 2020-07-22 MED ORDER — METOPROLOL SUCCINATE ER 100 MG PO TB24: 100.0000 mg | ORAL_TABLET | Freq: Every day | ORAL | 3 refills | Status: DC

## 2020-07-22 MED ORDER — LOSARTAN POTASSIUM 25 MG PO TABS: 25.0000 mg | ORAL_TABLET | Freq: Every day | ORAL | 3 refills | Status: DC

## 2020-07-22 NOTE — Telephone Encounter (Signed)
° ° ° °*  STAT* If patient is at the pharmacy, call can be transferred to refill team.   1. Which medications need to be refilled? (please list name of each medication and dose if known)   amiodarone (PACERONE) 200 MG tablet  atorvastatin (LIPITOR) 10 MG tablet  ELIQUIS 5 MG TABS tablet    ferrous sulfate 325 (65 FE) MG tablet    furosemide (LASIX) 40 MG tablet    losartan (COZAAR) 25 MG tablet    metoprolol succinate (TOPROL-XL) 100 MG 24 hr tablet    potassium chloride SA (KLOR-CON) 20 MEQ tablet    spironolactone (ALDACTONE) 25 MG tablet    2. Which pharmacy/location (including street and city if local pharmacy) is medication to be sent to? [id number] - [place], [place] - [address]  3. Do they need a 30 day or 90 day supply? 30 days

## 2020-07-22 NOTE — Telephone Encounter (Signed)
[name] medications were sent to pt's pharmacy as requested. Confirmation received.  

## 2020-07-22 NOTE — Telephone Encounter (Addendum)
Eliquis 5mg  refill sent by refill dept and doseage correct. Will deny this duplicate.

## 2020-07-22 NOTE — Addendum Note (Signed)
Addended by: Pamala Hurry B on: 07/22/2020 10:49 AM   Modules accepted: Orders

## 2020-07-22 NOTE — Addendum Note (Signed)
Addended by: Margaret Pyle D on: 07/22/2020 10:42 AM   Modules accepted: Orders

## 2020-07-23 DIAGNOSIS — I2510 Atherosclerotic heart disease of native coronary artery without angina pectoris: Secondary | ICD-10-CM | POA: Diagnosis not present

## 2020-07-23 DIAGNOSIS — L89610 Pressure ulcer of right heel, unstageable: Secondary | ICD-10-CM | POA: Diagnosis not present

## 2020-07-23 DIAGNOSIS — I4819 Other persistent atrial fibrillation: Secondary | ICD-10-CM | POA: Diagnosis not present

## 2020-07-23 DIAGNOSIS — Z7901 Long term (current) use of anticoagulants: Secondary | ICD-10-CM | POA: Diagnosis not present

## 2020-07-23 DIAGNOSIS — Z87891 Personal history of nicotine dependence: Secondary | ICD-10-CM | POA: Diagnosis not present

## 2020-07-23 DIAGNOSIS — I110 Hypertensive heart disease with heart failure: Secondary | ICD-10-CM | POA: Diagnosis not present

## 2020-07-23 DIAGNOSIS — Z952 Presence of prosthetic heart valve: Secondary | ICD-10-CM | POA: Diagnosis not present

## 2020-07-23 DIAGNOSIS — Z48815 Encounter for surgical aftercare following surgery on the digestive system: Secondary | ICD-10-CM | POA: Diagnosis not present

## 2020-07-23 DIAGNOSIS — I5042 Chronic combined systolic (congestive) and diastolic (congestive) heart failure: Secondary | ICD-10-CM | POA: Diagnosis not present

## 2020-07-23 DIAGNOSIS — E785 Hyperlipidemia, unspecified: Secondary | ICD-10-CM | POA: Diagnosis not present

## 2020-07-26 DIAGNOSIS — I4819 Other persistent atrial fibrillation: Secondary | ICD-10-CM | POA: Diagnosis not present

## 2020-07-26 DIAGNOSIS — I5042 Chronic combined systolic (congestive) and diastolic (congestive) heart failure: Secondary | ICD-10-CM | POA: Diagnosis not present

## 2020-07-26 DIAGNOSIS — I110 Hypertensive heart disease with heart failure: Secondary | ICD-10-CM | POA: Diagnosis not present

## 2020-07-26 DIAGNOSIS — Z48815 Encounter for surgical aftercare following surgery on the digestive system: Secondary | ICD-10-CM | POA: Diagnosis not present

## 2020-07-26 DIAGNOSIS — L89610 Pressure ulcer of right heel, unstageable: Secondary | ICD-10-CM | POA: Diagnosis not present

## 2020-07-26 DIAGNOSIS — I2510 Atherosclerotic heart disease of native coronary artery without angina pectoris: Secondary | ICD-10-CM | POA: Diagnosis not present

## 2020-08-02 DIAGNOSIS — Z48815 Encounter for surgical aftercare following surgery on the digestive system: Secondary | ICD-10-CM | POA: Diagnosis not present

## 2020-08-02 DIAGNOSIS — I2510 Atherosclerotic heart disease of native coronary artery without angina pectoris: Secondary | ICD-10-CM | POA: Diagnosis not present

## 2020-08-02 DIAGNOSIS — I5042 Chronic combined systolic (congestive) and diastolic (congestive) heart failure: Secondary | ICD-10-CM | POA: Diagnosis not present

## 2020-08-02 DIAGNOSIS — I110 Hypertensive heart disease with heart failure: Secondary | ICD-10-CM | POA: Diagnosis not present

## 2020-08-02 DIAGNOSIS — I4819 Other persistent atrial fibrillation: Secondary | ICD-10-CM | POA: Diagnosis not present

## 2020-08-02 DIAGNOSIS — L89610 Pressure ulcer of right heel, unstageable: Secondary | ICD-10-CM | POA: Diagnosis not present

## 2020-08-06 DIAGNOSIS — Z23 Encounter for immunization: Secondary | ICD-10-CM | POA: Diagnosis not present

## 2020-08-09 DIAGNOSIS — Z48815 Encounter for surgical aftercare following surgery on the digestive system: Secondary | ICD-10-CM | POA: Diagnosis not present

## 2020-08-09 DIAGNOSIS — I110 Hypertensive heart disease with heart failure: Secondary | ICD-10-CM | POA: Diagnosis not present

## 2020-08-09 DIAGNOSIS — I4819 Other persistent atrial fibrillation: Secondary | ICD-10-CM | POA: Diagnosis not present

## 2020-08-09 DIAGNOSIS — I5042 Chronic combined systolic (congestive) and diastolic (congestive) heart failure: Secondary | ICD-10-CM | POA: Diagnosis not present

## 2020-08-09 DIAGNOSIS — L89610 Pressure ulcer of right heel, unstageable: Secondary | ICD-10-CM | POA: Diagnosis not present

## 2020-08-09 DIAGNOSIS — I2510 Atherosclerotic heart disease of native coronary artery without angina pectoris: Secondary | ICD-10-CM | POA: Diagnosis not present

## 2020-08-16 DIAGNOSIS — I4819 Other persistent atrial fibrillation: Secondary | ICD-10-CM | POA: Diagnosis not present

## 2020-08-16 DIAGNOSIS — Z48815 Encounter for surgical aftercare following surgery on the digestive system: Secondary | ICD-10-CM | POA: Diagnosis not present

## 2020-08-16 DIAGNOSIS — I2510 Atherosclerotic heart disease of native coronary artery without angina pectoris: Secondary | ICD-10-CM | POA: Diagnosis not present

## 2020-08-16 DIAGNOSIS — I5042 Chronic combined systolic (congestive) and diastolic (congestive) heart failure: Secondary | ICD-10-CM | POA: Diagnosis not present

## 2020-08-16 DIAGNOSIS — I110 Hypertensive heart disease with heart failure: Secondary | ICD-10-CM | POA: Diagnosis not present

## 2020-08-16 DIAGNOSIS — L89610 Pressure ulcer of right heel, unstageable: Secondary | ICD-10-CM | POA: Diagnosis not present

## 2020-10-07 ENCOUNTER — Other Ambulatory Visit: Payer: Self-pay | Admitting: Interventional Cardiology

## 2020-11-08 ENCOUNTER — Other Ambulatory Visit: Payer: Self-pay | Admitting: Interventional Cardiology

## 2020-11-29 ENCOUNTER — Telehealth: Payer: Self-pay | Admitting: Interventional Cardiology

## 2020-11-29 NOTE — Telephone Encounter (Signed)
Spoke with pt in regards to weight gain and BLE edema.  He reports BLE for the past 3-4 days.  He does not weigh himself daily and does not have any readings.  However, reports there is a 5 lb increase from last week. He denies increased salt in his diet.  Has only had 2-3 hotdogs in the last 2-3 weeks.  He does not wear compression socks d/t a pressure ulcer to his heel that has every other day dressing changes.   He elevates BLE most of the day he sits in a recliner most of the day.  He expresses that he has not missed any doses of furosemide and has not noticed a decrease in UOP.  122/76-68 readings from today while on the phone.  Will route to MD for recommendations.

## 2020-11-29 NOTE — Telephone Encounter (Signed)
Called and spoke with patient and wife.  Informed him of MD recommendation to increase furosemide to 40 mg PO BID for 3 days(take 2nd dose at [facility]) and then go back to furosemide 40 mg PO QD.  Call back if swelling does not improve.  Pt and wife verbalize understanding.

## 2020-11-29 NOTE — Telephone Encounter (Signed)
Pt c/o swelling: STAT is pt has developed SOB within 24 hours  If swelling, where is the swelling located? Feet and ankles   How much weight have you gained and in what time span? 5 lbs in a week   Have you gained 3 pounds in a day or 5 pounds in a week? 5 lbs in a week  Do you have a log of your daily weights (if so, list)?  197 (today)  Are you currently taking a fluid pill? Yes. Pt takes 1 lasix and 1 potassium daily   Are you currently SOB? no  Have you traveled recently? No  Wife presses on his ankles and there is edema. She states the patient is not good about writing down his weights

## 2020-12-23 DIAGNOSIS — H3540 Unspecified peripheral retinal degeneration: Secondary | ICD-10-CM | POA: Diagnosis not present

## 2020-12-23 DIAGNOSIS — H34811 Central retinal vein occlusion, right eye, with macular edema: Secondary | ICD-10-CM | POA: Diagnosis not present

## 2020-12-23 DIAGNOSIS — H43813 Vitreous degeneration, bilateral: Secondary | ICD-10-CM | POA: Diagnosis not present

## 2021-01-10 ENCOUNTER — Ambulatory Visit: Payer: Medicare Other | Admitting: Interventional Cardiology

## 2021-03-21 DIAGNOSIS — Z23 Encounter for immunization: Secondary | ICD-10-CM | POA: Diagnosis not present

## 2021-03-21 DIAGNOSIS — I4891 Unspecified atrial fibrillation: Secondary | ICD-10-CM | POA: Diagnosis not present

## 2021-03-21 DIAGNOSIS — I5022 Chronic systolic (congestive) heart failure: Secondary | ICD-10-CM | POA: Diagnosis not present

## 2021-03-21 DIAGNOSIS — I4892 Unspecified atrial flutter: Secondary | ICD-10-CM | POA: Diagnosis not present

## 2021-03-21 DIAGNOSIS — E785 Hyperlipidemia, unspecified: Secondary | ICD-10-CM | POA: Diagnosis not present

## 2021-03-21 DIAGNOSIS — Z0000 Encounter for general adult medical examination without abnormal findings: Secondary | ICD-10-CM | POA: Diagnosis not present

## 2021-04-04 ENCOUNTER — Other Ambulatory Visit: Payer: Self-pay | Admitting: Interventional Cardiology

## 2021-04-25 ENCOUNTER — Other Ambulatory Visit: Payer: Self-pay | Admitting: Interventional Cardiology

## 2021-04-25 NOTE — Telephone Encounter (Signed)
Eliquis 5 mg refill request received. Patient is 80 years old, weight-82.9 kg, Crea- 1.29 on 07/13/20, Diagnosis-PAF, and last seen by DrMarland Kitchen Eldridge Dace on 07/13/20. Dose is appropriate based on dosing criteria. Will send in refill to requested pharmacy.

## 2021-05-02 NOTE — Progress Notes (Signed)
Cardiology Office Note   Date:  05/03/2021   ID:  [name], [name] Nov 03, 1940, MRN [id number]  PCP:  Street, [name] Coup, MD    No chief complaint on file.  CAD  Wt Readings from Last 3 Encounters:  05/03/21 198 lb (89.8 kg)  07/13/20 182 lb 12.8 oz (82.9 kg)  02/19/20 170 lb (77.1 kg)       History of Present Illness: [name] [name] is a 80 y.o. male   with h/o aortic disection, CAD - DES to circumflex in Sept 2011. 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 He was admitted to the hospital on 10/05/13 for urosepsis and was on the ventilator for 2 days. Also has thoracentesis for pleural effusion (1200cc).    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  He had a retinal vein thrombosis apparently, per the patient's report.   In 4/21, he was admitted with heart failure. Echo showed: "Left ventricular ejection fraction, by estimation, is 25 to 30%.  The left ventricle has severely decreased function. The left ventricle  demonstrates global hypokinesis."    Cath showed: "Chronic total occlusion of the mid right coronary, collateralized right to right and left to right. Widely patent left main. Widely patent LAD with moderate luminal irregularities proximal to distal. Widely patent stent in the proximal to mid circumflex with up to 50% narrowing distal to the stent. Moderate pulmonary hypertension.  Mean pulmonary artery pressure 35 mmHg. Mean pulmonary capillary wedge pressure 32 mmHg.   RECOMMENDATIONS:   Left ventricular systolic dysfunction is not on the basis of coronary disease.  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    More recently, history includes: " incarcerated left inguinal hernia s/p repair on 12/02/2019, presents to ED with abd pain. He was found to  have partial colonic obstruction. General surgery consulted. Cardiology consulted for atrial flutter and pre op clearance. Patient Underwent LAP  Repair of the recurrent inguinal hernia by Dr Andrey Campanile on 12/18/19."   IV Amio was used for rate controlled while he was NPO.    He had a seroma that was draining.  This resolved.    Had a pressure sore area  On his heel.  Had home health for 9 months.    Son passed away in 2023-06-21 after a bleeding episode after cath/PCI.  He also had Addisons.  He was in [place].  Since the last visit, he has done well.  Walks for exercise. Careful to avoid falling.    Past Medical History:  Diagnosis Date   Acute on chronic diastolic heart failure (HCC) 08/22/2013   Acute on chronic respiratory failure with hypoxia (HCC) 10/03/2019   Acute on chronic systolic heart failure (HCC) 05/11/2013   EF from 35% to 50-55% on 6/14 ECHO.    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 No evidence of dissection or endoleak.  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 No evidence of abdominal aortic dissection.  Occluded proximal left subclav   Aortic valve insufficiency    Arthritis    "probably in my knees before they replaced them" (01/28/2015)   Atrial fibrillation (HCC) 05/21/2013   Atrial flutter (HCC)    Bradycardia 08/23/2013   CAD (coronary artery disease)    Chest pain 01/28/2015   CHF (congestive heart failure) (HCC)    Chronic diastolic heart failure (HCC) 01/28/2015   Descending aortic aneurysm (HCC)    DVT (deep venous thrombosis) (HCC)    ?LLE post knee surgery   Dyspnea 09/2019   HCAP  (healthcare-associated pneumonia) 10/11/2013   History of blood transfusion    "after one of my knee surgeries"   HTN (hypertension)    Hyperlipidemia    Hypotension 12/17/2019   Incarcerated left inguinal hernia s/p repair 12/02/2019 12/01/2019   Insomnia 09/04/2013   Multiple fractures of ribs, right side, init for clos fx 09/04/2019   Near syncope 01/28/2015   Pressure injury of skin 10/06/2019   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 12/18/2019 12/17/2019   Recurrent UTI 10/06/2013   Shock circulatory (HCC) 10/07/2013   Thoracic aneurysm    Tobacco abuse    Urinary retention 10/06/2013   UTI (urinary tract infection) 10/06/2013    Past Surgical History:  Procedure Laterality Date   AORTIC VALVE REPLACEMENT  02/19/2009   AORTO-FEMORAL BYPASS GRAFT  03/2010   ascending aortic and arch aneurysm repair/notes 04/09/2009   CARDIAC VALVE REPLACEMENT     CAROTID-SUBCLAVIAN BYPASS GRAFT Left 08/26/2013   Procedure: BYPASS GRAFT CAROTID-SUBCLAVIAN;  Surgeon: Nada Libman, MD;  Location: [facility] OR;  Service: Vascular;  Laterality: Left;   CATARACT EXTRACTION W/ INTRAOCULAR LENS  IMPLANT, BILATERAL Bilateral    ENDOVASCULAR STENT INSERTION N/A 08/26/2013   Procedure:  THORACIC STENT GRAFT INSERTION;  Surgeon: Nada Libman, MD;  Location: [facility] OR;  Service: Vascular;  Laterality: N/A;   EYE SURGERY     INGUINAL HERNIA REPAIR Left 12/02/2019   Procedure: REPAIR LEFT INGUINAL HERNIA WITH MESH;  Surgeon: Violeta Gelinas, MD;  Location: [facility] OR;  Service: General;  Laterality: Left;   INGUINAL HERNIA REPAIR Left 12/18/2019   Procedure: LAPAROSCOPIC ASSISTED REPAIR OF RECURRENT INCARCERATED LEFT INGUINAL HERNIA WITH MESH; LAPAOSCOPIC REPAIR OF SEROSAL TEAR;  Surgeon: Gaynelle Adu, MD;  Location: [facility] OR;  Service: General;  Laterality: Left;   INSERTION OF MESH Left 12/02/2019   Procedure: INSERTION OF MESH;  Surgeon: Violeta Gelinas, MD;  Location: [facility] OR;  Service: General;   Laterality: Left;   JOINT REPLACEMENT     KNEE ARTHROSCOPY Bilateral    REPLACEMENT TOTAL KNEE Bilateral    right groin lymphocele Right 03/29/2009   RIGHT/LEFT HEART CATH AND CORONARY ANGIOGRAPHY N/A 10/06/2019   Procedure: RIGHT/LEFT HEART CATH AND CORONARY ANGIOGRAPHY;  Surgeon: Lyn Records, MD;  [facility]: [facility] INVASIVE CV LAB;  Service: Cardiovascular;  Laterality: N/A;   THORACENTESIS  2010 X 2   TONSILLECTOMY       Current Outpatient Medications  Medication Sig Dispense Refill   acetaminophen (TYLENOL) 500 MG tablet Take 2 tablets (1,000 mg total) by mouth every 6 (six) hours as needed. 30 tablet 0   amiodarone (PACERONE) 200 MG tablet Take 1 tablet (200 mg total) by mouth daily. 90 tablet 3   amoxicillin (AMOXIL) 500 MG capsule Take 4 capsules by mouth 30-60 min prior to dental procedure. 4 capsule 3   atorvastatin (LIPITOR) 10 MG tablet Take 1 tablet (10 mg total) by mouth  daily. 90 tablet 3   ELIQUIS 5 MG TABS tablet Take 1 tablet by mouth twice daily 60 tablet 0   ferrous sulfate 325 (65 FE) MG tablet Take 1 tablet (325 mg total) by mouth daily with breakfast. 90 tablet 3   furosemide (LASIX) 40 MG tablet Take 1 tablet (40 mg total) by mouth daily. 90 tablet 3   lansoprazole (PREVACID) 30 MG capsule Take 30 mg by mouth daily.     levocetirizine (XYZAL) 5 MG tablet Take 5 mg by mouth daily.     losartan (COZAAR) 25 MG tablet Take 1 tablet by mouth once daily 90 tablet 3   metoprolol succinate (TOPROL-XL) 100 MG 24 hr tablet Take 1 tablet (100 mg total) by mouth daily. Take with or immediately following a meal. 90 tablet 3   Multiple Vitamin (MULTIVITAMIN WITH MINERALS) TABS tablet Take 1 tablet by mouth daily.     potassium chloride SA (KLOR-CON) 20 MEQ tablet Take 2 tablets (40 mEq total) by mouth daily. 180 tablet 3   spironolactone (ALDACTONE) 25 MG tablet Take 1/2 (one-half) tablet by mouth once daily 45 tablet 0   loratadine (CLARITIN) 10 MG tablet Take 1 tablet (10 mg  total) by mouth daily. (Patient not taking: Reported on 05/03/2021) 30 tablet 11   No current facility-administered medications for this visit.    Allergies:   Lortab [hydrocodone-acetaminophen], Morphine and related, and Other    Social History:  The patient  reports that he quit smoking about 47 years ago. His smoking use included cigarettes. He has never used smokeless tobacco. He reports that he does not drink alcohol and does not use drugs.   Family History:  The patient's family history includes Aortic aneurysm in his father; Celiac disease in his daughter; Hypertension in his mother.    ROS:  Please see the history of present illness.   Otherwise, review of systems are positive for weight gain.   All other systems are reviewed and negative.    PHYSICAL EXAM: VS:  BP (!) 112/56   Pulse 63   Ht 5\' 10"  (1.778 m)   Wt 198 lb (89.8 kg)   SpO2 97%   BMI 28.41 kg/m  , BMI Body mass index is 28.41 kg/m. GEN: Well nourished, well developed, in no acute distress HEENT: normal Neck: no JVD, carotid bruits, or masses Cardiac: irregularly irregular; no murmurs, rubs, or gallops,; 1+ ankle edema bilateral Respiratory:  clear to auscultation bilaterally, normal work of breathing GI: soft, nontender, nondistended, + BS MS: no deformity or atrophy Skin: warm and dry, no rash Neuro:  Strength and sensation are intact Psych: euthymic mood, full affect   EKG:   The ekg ordered today demonstrates AFib, rate controlled   Recent Labs: 07/13/2020: ALT 16; BUN 34; Creatinine, Ser 1.29; Hemoglobin 10.5; Platelets 205; Potassium 5.0; Sodium 138; TSH 2.970   Lipid Panel    Component Value Date/Time   CHOL 155 07/13/2020 1103   TRIG 176 (H) 07/13/2020 1103   HDL 46 07/13/2020 1103   CHOLHDL 3.4 07/13/2020 1103   CHOLHDL 3.6 07/29/2010 0210   VLDL 18 07/29/2010 0210   LDLCALC 79 07/13/2020 1103     Other studies Reviewed: Additional studies/ records that were reviewed today with  results demonstrating: labs from Jan 2022, Hbg 10.5, LDL 79.   ASSESSMENT AND PLAN:  CAD: No angina. Continue aggressive secondary prevention. Eats a healthy diet. Walks as tolerated.   S/p AVR: SBE prophylaxis required for dental treatments.  No CHF sx.  PAF: Rate controlled.  Eliquis for stroke prevention.  Chronic systolic/diastolic heart failure: Appears euvolemic.  Anticoagulated: No bleeding problems.  Careful to avoid falling.  Uses a cane.   Current medicines are reviewed at length with the patient today.  The patient concerns regarding his medicines were addressed.  The following changes have been made:  No change  Labs/ tests ordered today include:  No orders of the defined types were placed in this encounter.   Recommend 150 minutes/week of aerobic exercise Low fat, low carb, high fiber diet recommended  Disposition:   FU in 9 months   Signed, Lance Muss, MD  05/03/2021 9:38 AM    [facility] [address] [place], [place], [place]  [address] Phone: [phone]; Fax: [phone]

## 2021-05-03 ENCOUNTER — Encounter: Payer: Self-pay | Admitting: Interventional Cardiology

## 2021-05-03 ENCOUNTER — Ambulatory Visit (INDEPENDENT_AMBULATORY_CARE_PROVIDER_SITE_OTHER): Payer: Medicare Other | Admitting: Interventional Cardiology

## 2021-05-03 ENCOUNTER — Other Ambulatory Visit: Payer: Self-pay

## 2021-05-03 VITALS — BP 112/56 | HR 63 | Ht 70.0 in | Wt 198.0 lb

## 2021-05-03 DIAGNOSIS — I480 Paroxysmal atrial fibrillation: Secondary | ICD-10-CM | POA: Diagnosis not present

## 2021-05-03 DIAGNOSIS — Z7901 Long term (current) use of anticoagulants: Secondary | ICD-10-CM

## 2021-05-03 DIAGNOSIS — I5041 Acute combined systolic (congestive) and diastolic (congestive) heart failure: Secondary | ICD-10-CM

## 2021-05-03 DIAGNOSIS — I2510 Atherosclerotic heart disease of native coronary artery without angina pectoris: Secondary | ICD-10-CM | POA: Diagnosis not present

## 2021-05-03 DIAGNOSIS — Z952 Presence of prosthetic heart valve: Secondary | ICD-10-CM | POA: Diagnosis not present

## 2021-05-03 LAB — COMPREHENSIVE METABOLIC PANEL
ALT: 22 IU/L (ref 0–44)
AST: 29 IU/L (ref 0–40)
Albumin/Globulin Ratio: 2 (ref 1.2–2.2)
Albumin: 4.5 g/dL (ref 3.6–4.6)
Alkaline Phosphatase: 64 IU/L (ref 44–121)
BUN/Creatinine Ratio: 23 (ref 10–24)
BUN: 34 mg/dL — ABNORMAL HIGH (ref 8–27)
Bilirubin Total: 0.4 mg/dL (ref 0.0–1.2)
CO2: 24 mmol/L (ref 20–29)
Calcium: 9.1 mg/dL (ref 8.6–10.2)
Chloride: 101 mmol/L (ref 96–106)
Creatinine, Ser: 1.49 mg/dL — ABNORMAL HIGH (ref 0.76–1.27)
Globulin, Total: 2.2 g/dL (ref 1.5–4.5)
Glucose: 92 mg/dL (ref 70–99)
Potassium: 5 mmol/L (ref 3.5–5.2)
Sodium: 140 mmol/L (ref 134–144)
Total Protein: 6.7 g/dL (ref 6.0–8.5)
eGFR: 47 mL/min/{1.73_m2} — ABNORMAL LOW (ref >59)

## 2021-05-03 LAB — CBC
Hematocrit: 31.9 % — ABNORMAL LOW (ref 37.5–51.0)
Hemoglobin: 10.5 g/dL — ABNORMAL LOW (ref 13.0–17.7)
MCH: 32.4 pg (ref 26.6–33.0)
MCHC: 32.9 g/dL (ref 31.5–35.7)
MCV: 99 fL — ABNORMAL HIGH (ref 79–97)
Platelets: 209 10*3/uL (ref 150–450)
RBC: 3.24 x10E6/uL — ABNORMAL LOW (ref 4.14–5.80)
RDW: 12.7 % (ref 11.6–15.4)
WBC: 5.7 10*3/uL (ref 3.4–10.8)

## 2021-05-03 LAB — TSH: TSH: 3.45 u[IU]/mL (ref 0.450–4.500)

## 2021-05-03 MED ORDER — FUROSEMIDE 40 MG PO TABS: 40.0000 mg | ORAL_TABLET | Freq: Every day | ORAL | 3 refills | Status: DC

## 2021-05-03 MED ORDER — APIXABAN 5 MG PO TABS: 5.0000 mg | ORAL_TABLET | Freq: Two times a day (BID) | ORAL | 3 refills | Status: DC

## 2021-05-03 MED ORDER — ATORVASTATIN CALCIUM 10 MG PO TABS: 10.0000 mg | ORAL_TABLET | Freq: Every day | ORAL | 3 refills | Status: DC

## 2021-05-03 MED ORDER — SPIRONOLACTONE 25 MG PO TABS: ORAL_TABLET | ORAL | 3 refills | Status: DC

## 2021-05-03 NOTE — Patient Instructions (Signed)
Medication Instructions:  Your physician recommends that you continue on your current medications as directed. Please refer to the Current Medication list given to you today.  *If you need a refill on your cardiac medications before your next appointment, please call your pharmacy*   Lab Work: Lab work to be done today--CMET, CBC, TSH If you have labs (blood work) drawn today and your tests are completely normal, you will receive your results only by: MyChart Message (if you have MyChart) OR A paper copy in the mail If you have any lab test that is abnormal or we need to change your treatment, we will call you to review the results.   Testing/Procedures: none   Follow-Up: At [facility], you and your health needs are our priority.  As part of our continuing mission to provide you with exceptional heart care, we have created designated Provider Care Teams.  These Care Teams include your primary Cardiologist (physician) and Advanced Practice Providers (APPs -  Physician Assistants and Nurse Practitioners) who all work together to provide you with the care you need, when you need it.  We recommend signing up for the patient portal called "MyChart".  Sign up information is provided on this After Visit Summary.  MyChart is used to connect with patients for Virtual Visits (Telemedicine).  Patients are able to view lab/test results, encounter notes, upcoming appointments, etc.  Non-urgent messages can be sent to your provider as well.   To learn more about what you can do with MyChart, go to [email or web address].    Your next appointment:   9 month(s)--August /September 2023  The format for your next appointment:   In Person  Provider:   Lance Muss, MD     Other Instructions

## 2021-05-26 ENCOUNTER — Ambulatory Visit: Payer: Medicare Other | Admitting: Interventional Cardiology

## 2021-06-23 DIAGNOSIS — H34811 Central retinal vein occlusion, right eye, with macular edema: Secondary | ICD-10-CM | POA: Diagnosis not present

## 2021-06-23 DIAGNOSIS — H43813 Vitreous degeneration, bilateral: Secondary | ICD-10-CM | POA: Diagnosis not present

## 2021-06-23 DIAGNOSIS — H35033 Hypertensive retinopathy, bilateral: Secondary | ICD-10-CM | POA: Diagnosis not present

## 2021-06-23 DIAGNOSIS — H3540 Unspecified peripheral retinal degeneration: Secondary | ICD-10-CM | POA: Diagnosis not present

## 2021-07-14 ENCOUNTER — Other Ambulatory Visit: Payer: Self-pay | Admitting: Interventional Cardiology

## 2021-07-29 ENCOUNTER — Other Ambulatory Visit: Payer: Self-pay | Admitting: Cardiothoracic Surgery

## 2021-07-29 DIAGNOSIS — I71019 Dissection of thoracic aorta, unspecified: Secondary | ICD-10-CM

## 2021-08-04 ENCOUNTER — Other Ambulatory Visit: Payer: Self-pay | Admitting: Interventional Cardiology

## 2021-08-07 DIAGNOSIS — R059 Cough, unspecified: Secondary | ICD-10-CM | POA: Diagnosis not present

## 2021-08-07 DIAGNOSIS — J189 Pneumonia, unspecified organism: Secondary | ICD-10-CM | POA: Diagnosis not present

## 2021-08-10 DIAGNOSIS — J40 Bronchitis, not specified as acute or chronic: Secondary | ICD-10-CM | POA: Diagnosis not present

## 2021-08-14 ENCOUNTER — Other Ambulatory Visit: Payer: Self-pay | Admitting: Interventional Cardiology

## 2021-08-16 DIAGNOSIS — I10 Essential (primary) hypertension: Secondary | ICD-10-CM | POA: Diagnosis not present

## 2021-08-16 DIAGNOSIS — E039 Hypothyroidism, unspecified: Secondary | ICD-10-CM | POA: Diagnosis not present

## 2021-08-16 DIAGNOSIS — F339 Major depressive disorder, recurrent, unspecified: Secondary | ICD-10-CM | POA: Diagnosis not present

## 2021-08-29 ENCOUNTER — Other Ambulatory Visit: Payer: Self-pay

## 2021-08-29 ENCOUNTER — Ambulatory Visit
Admission: RE | Admit: 2021-08-29 | Discharge: 2021-08-29 | Disposition: A | Discharge status: Home / Self Care | Payer: Medicare Other | Source: Ambulatory Visit | Attending: Cardiothoracic Surgery | Admitting: Cardiothoracic Surgery

## 2021-08-29 ENCOUNTER — Ambulatory Visit (INDEPENDENT_AMBULATORY_CARE_PROVIDER_SITE_OTHER): Payer: Medicare Other | Admitting: Cardiothoracic Surgery

## 2021-08-29 ENCOUNTER — Encounter: Payer: Self-pay | Admitting: Cardiothoracic Surgery

## 2021-08-29 ENCOUNTER — Other Ambulatory Visit: Payer: Medicare Other

## 2021-08-29 VITALS — BP 144/76 | HR 62 | Resp 20 | Ht 70.0 in | Wt 199.0 lb

## 2021-08-29 DIAGNOSIS — I71019 Dissection of thoracic aorta, unspecified: Secondary | ICD-10-CM

## 2021-08-29 DIAGNOSIS — M314 Aortic arch syndrome [Takayasu]: Secondary | ICD-10-CM | POA: Diagnosis not present

## 2021-08-29 DIAGNOSIS — K4090 Unilateral inguinal hernia, without obstruction or gangrene, not specified as recurrent: Secondary | ICD-10-CM | POA: Diagnosis not present

## 2021-08-29 DIAGNOSIS — J439 Emphysema, unspecified: Secondary | ICD-10-CM | POA: Diagnosis not present

## 2021-08-29 DIAGNOSIS — Z952 Presence of prosthetic heart valve: Secondary | ICD-10-CM | POA: Diagnosis not present

## 2021-08-29 DIAGNOSIS — I71012 Dissection of descending thoracic aorta: Secondary | ICD-10-CM | POA: Diagnosis not present

## 2021-08-29 MED ORDER — IOPAMIDOL (ISOVUE-370) INJECTION 76%
75.0000 mL | Freq: Once | INTRAVENOUS | Status: AC | PRN
  Administered 2021-08-29: 60 mL via INTRAVENOUS

## 2021-08-29 NOTE — Progress Notes (Signed)
PCP is Street, [name], MD ?Referring Provider is [name], MD ? ?Chief Complaint  ?Patient presents with  ? Thoracic Aortic Dissection  ?  18 month f.u with CTA chest/abd  ? ? ?HPI: 81 year old patient who returns for scheduled follow-up with CTA for history of previous biologic-Bentall procedure 12 years ago and descending thoracic aortic stent graft for typeB descending thoracic aorta dissection.  He has had no significant complaints since his last visit over a year ago.  His CHF is well managed by cardiology.  He denies chest pain.  He denies significant ankle edema, syncope or palpitations.  He is followed by Dr.Varanasi.  He takes Eliquis for PAF. ? ? ?Past Medical History:  ?Diagnosis Date  ? Acute on chronic diastolic heart failure (HCC) 08/22/2013  ? Acute on chronic respiratory failure with hypoxia (HCC) 10/03/2019  ? Acute on chronic systolic heart failure (HCC) 05/11/2013  ? EF from 35% to 50-55% on 6/14 ECHO.   ? Acute respiratory failure with hypoxia (HCC) 10/07/2013  ? AKI (acute kidney injury) (HCC) 12/17/2019  ? Aortic atherosclerosis (HCC) 05/05/2013  ? Aortic dissection (HCC) 05/04/2013  ?  Descending only, had aneurysm of ascending but no dissection   02/12/2014 Stable aortic stent graft over the aortic arch and descending thoracic aorta with significant reduction in the mural thrombus of the native aneurysm sac. No evidence of dissection or endoleak.  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 No evidence of abdominal aortic dissection.  Occluded proximal left subclav  ? Aortic valve insufficiency   ? Arthritis   ? "probably in my knees before they replaced them" (01/28/2015)  ? Atrial fibrillation (HCC) 05/21/2013  ? Atrial flutter (HCC)   ? Bradycardia 08/23/2013  ? CAD (coronary artery disease)   ? Chest pain 01/28/2015  ? CHF (congestive heart failure) (HCC)   ? Chronic diastolic heart failure (HCC) 01/28/2015  ? Descending aortic aneurysm (HCC)   ? DVT (deep venous  thrombosis) (HCC)   ? ?LLE post knee surgery  ? Dyspnea 09/2019  ? HCAP (healthcare-associated pneumonia) 10/11/2013  ? History of blood transfusion   ? "after one of my knee surgeries"  ? HTN (hypertension)   ? Hyperlipidemia   ? Hypotension 12/17/2019  ? Incarcerated left inguinal hernia s/p repair 12/02/2019 12/01/2019  ? Insomnia 09/04/2013  ? Multiple fractures of ribs, right side, init for clos fx 09/04/2019  ? Near syncope 01/28/2015  ? Pressure injury of skin 10/06/2019  ? 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 12/18/2019 12/17/2019  ? Recurrent UTI 10/06/2013  ? Shock circulatory (HCC) 10/07/2013  ? Thoracic aneurysm   ? Tobacco abuse   ? Urinary retention 10/06/2013  ? UTI (urinary tract infection) 10/06/2013  ? ? ?Past Surgical History:  ?Procedure Laterality Date  ? AORTIC VALVE REPLACEMENT  02/19/2009  ? AORTO-FEMORAL BYPASS GRAFT  03/2010  ? ascending aortic and arch aneurysm repair/notes 04/09/2009  ? CARDIAC VALVE REPLACEMENT    ? CAROTID-SUBCLAVIAN BYPASS GRAFT Left 08/26/2013  ? Procedure: BYPASS GRAFT CAROTID-SUBCLAVIAN;  Surgeon: Nada Libman, MD;  Location: [facility] OR;  Service: Vascular;  Laterality: Left;  ? CATARACT EXTRACTION W/ INTRAOCULAR LENS  IMPLANT, BILATERAL Bilateral   ? ENDOVASCULAR STENT INSERTION N/A 08/26/2013  ? Procedure:  THORACIC STENT GRAFT INSERTION;  Surgeon: Nada Libman, MD;  Location: [facility] OR;  Service: Vascular;  Laterality: N/A;  ? EYE SURGERY    ? INGUINAL HERNIA REPAIR Left 12/02/2019  ? Procedure: REPAIR  LEFT INGUINAL HERNIA WITH MESH;  Surgeon: Violeta Gelinas, MD;  Location: [facility] OR;  Service: General;  Laterality: Left;  ? INGUINAL HERNIA REPAIR Left 12/18/2019  ? Procedure: LAPAROSCOPIC ASSISTED REPAIR OF RECURRENT INCARCERATED LEFT INGUINAL HERNIA WITH MESH; LAPAOSCOPIC REPAIR OF SEROSAL TEAR;  Surgeon: Gaynelle Adu, MD;  Location: [facility] OR;  Service: General;  Laterality: Left;  ? INSERTION OF MESH Left 12/02/2019  ? Procedure: INSERTION OF MESH;   Surgeon: Violeta Gelinas, MD;  Location: [facility] OR;  Service: General;  Laterality: Left;  ? JOINT REPLACEMENT    ? KNEE ARTHROSCOPY Bilateral   ? REPLACEMENT TOTAL KNEE Bilateral   ? right groin lymphocele Right 03/29/2009  ? RIGHT/LEFT HEART CATH AND CORONARY ANGIOGRAPHY N/A 10/06/2019  ? Procedure: RIGHT/LEFT HEART CATH AND CORONARY ANGIOGRAPHY;  Surgeon: Lyn Records, MD;  Location: [facility] INVASIVE CV LAB;  Service: Cardiovascular;  Laterality: N/A;  ? THORACENTESIS  2010 X 2  ? TONSILLECTOMY    ? ? ?Family History  ?Problem Relation Age of Onset  ? Celiac disease Daughter   ? Aortic aneurysm Father   ? Hypertension Mother   ? Heart attack Neg Hx   ? Stroke Neg Hx   ? ? ?Social History ?Social History  ? ?Tobacco Use  ? Smoking status: Former  ?  Packs/day: 0.00  ?  Types: Cigarettes  ?  Quit date: 04/16/1974  ?  Years since quitting: 47.4  ? Smokeless tobacco: Never  ?Vaping Use  ? Vaping Use: Never used  ?Substance Use Topics  ? Alcohol use: No  ? Drug use: No  ? ? ?Current Outpatient Medications  ?Medication Sig Dispense Refill  ? acetaminophen (TYLENOL) 500 MG tablet Take 2 tablets (1,000 mg total) by mouth every 6 (six) hours as needed. 30 tablet 0  ? amiodarone (PACERONE) 200 MG tablet Take 1 tablet by mouth once daily 30 tablet 9  ? amoxicillin (AMOXIL) 500 MG capsule Take 4 capsules by mouth 30-60 min prior to dental procedure. 4 capsule 3  ? apixaban (ELIQUIS) 5 MG TABS tablet Take 1 tablet (5 mg total) by mouth 2 (two) times daily. 180 tablet 3  ? atorvastatin (LIPITOR) 10 MG tablet Take 1 tablet (10 mg total) by mouth daily. 90 tablet 3  ? furosemide (LASIX) 40 MG tablet Take 1 tablet (40 mg total) by mouth daily. 90 tablet 3  ? lansoprazole (PREVACID) 30 MG capsule Take 30 mg by mouth daily.    ? levocetirizine (XYZAL) 5 MG tablet Take 5 mg by mouth daily.    ? losartan (COZAAR) 25 MG tablet Take 1 tablet by mouth once daily 90 tablet 3  ? metoprolol succinate (TOPROL-XL) 100 MG 24 hr tablet TAKE 1 TABLET  BY MOUTH ONCE DAILY TAKE  WITH  OR  INNEDIATELY  FOLLOWING  A  MEAL 90 tablet 2  ? Multiple Vitamin (MULTIVITAMIN WITH MINERALS) TABS tablet Take 1 tablet by mouth daily.    ? potassium chloride SA (KLOR-CON) 20 MEQ tablet Take 2 tablets (40 mEq total) by mouth daily. 180 tablet 3  ? spironolactone (ALDACTONE) 25 MG tablet Take 1/2 (one-half) tablet by mouth once daily 45 tablet 3  ? SV IRON 325 MG tablet Take 1 tablet by mouth once daily with breakfast 30 tablet 1  ? ?No current facility-administered medications for this visit.  ? ? ?Allergies  ?Allergen Reactions  ? Lortab [Hydrocodone-Acetaminophen] Hives  ?  No trouble breathing  ? Morphine And Related Hives  ? Other Other (  See Comments)  ?  Staples from surgery caused infection  ? ? ?Review of Systems ?No fever weight change, no dental complaints. ?Review of systems otherwise as noted above ? ?BP (!) 144/76 (BP Location: Left Arm, Patient Position: Sitting, Cuff Size: Normal)   Pulse 62   Resp 20   Ht 5\' 10"  (1.778 m)   Wt 199 lb (90.3 kg)   SpO2 99% Comment: RA  BMI 28.55 kg/m?  ?Physical Exam ?  ?   Exam ? ?  General- alert and comfortable ?   Neck- no JVD, no cervical adenopathy palpable, no carotid bruit ?  Lungs- clear without rales, wheezes ?  Cor- regular rate and rhythm, no murmur , gallop ?  Abdomen- soft, non-tender ?  Extremities - warm, non-tender, minimal edema ?  Neuro- oriented, appropriate, no focal weakness  ? ?Diagnostic Tests: ?CT images personally reviewed and report discussed with patient and wife.  The descending thoracic aortic diameter remains approximately 4.5 cm.  There is no false lumen.  There is no endoleak.  The aortic root has been replaced and the distal ascending aorta remains at 4 cm. ? ?Impression: ?Patient continues to do well. ?He understands importance of blood pressure control ?We will continue with the long-term surveillance scanning of his ascending and descending aortic repairs. ? ?Plan: ?Return next year with CTA  of thoracic aorta.  Continue current medications and heart healthy lifestyle. ? ? , MD ?[facility] ?[phone] ? ?

## 2021-08-30 ENCOUNTER — Other Ambulatory Visit: Payer: Self-pay | Admitting: Interventional Cardiology

## 2021-10-03 ENCOUNTER — Other Ambulatory Visit: Payer: Self-pay | Admitting: Interventional Cardiology

## 2021-11-03 ENCOUNTER — Other Ambulatory Visit: Payer: Self-pay | Admitting: Interventional Cardiology

## 2021-12-02 ENCOUNTER — Other Ambulatory Visit: Payer: Self-pay | Admitting: Interventional Cardiology

## 2021-12-26 DIAGNOSIS — H3540 Unspecified peripheral retinal degeneration: Secondary | ICD-10-CM | POA: Diagnosis not present

## 2021-12-26 DIAGNOSIS — H3582 Retinal ischemia: Secondary | ICD-10-CM | POA: Diagnosis not present

## 2021-12-26 DIAGNOSIS — H35033 Hypertensive retinopathy, bilateral: Secondary | ICD-10-CM | POA: Diagnosis not present

## 2021-12-26 DIAGNOSIS — H34811 Central retinal vein occlusion, right eye, with macular edema: Secondary | ICD-10-CM | POA: Diagnosis not present

## 2021-12-26 DIAGNOSIS — H43813 Vitreous degeneration, bilateral: Secondary | ICD-10-CM | POA: Diagnosis not present

## 2022-01-28 IMAGING — CT CT CERVICAL SPINE W/O CM
3 of 4 series · 11 of 33 positions shown, 13 images · non-contrast
Comparison: None.

CLINICAL DATA: Fall today landing on right side. On
anticoagulation.

EXAM:
CT CERVICAL SPINE WITHOUT CONTRAST
TECHNIQUE: Multidetector CT imaging of the cervical spine was performed without
intravenous contrast. Multiplanar CT image reconstructions were also
generated.

[Series 8: sag bone · sagittal · 0.24mm/px · 5 of 80 slices shown, 6 images]
[im 27/80  bone]
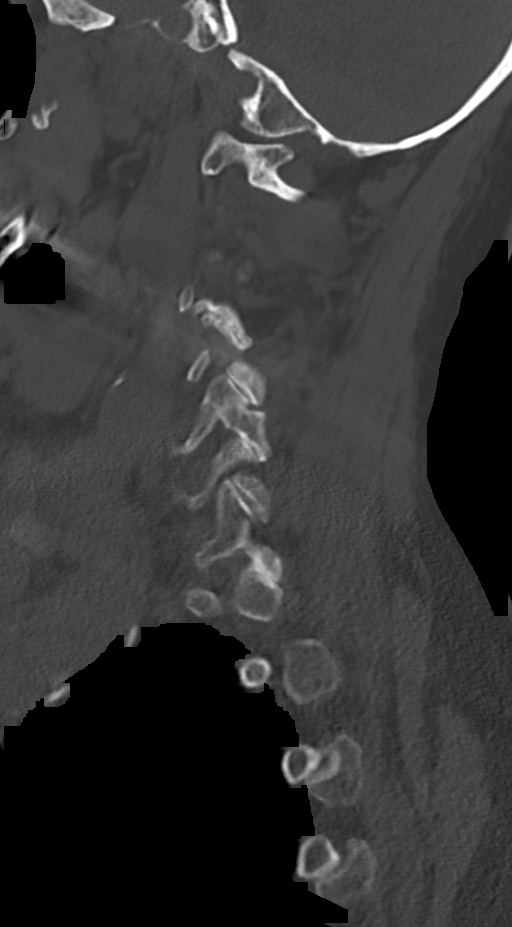
[im 33/80  bone]
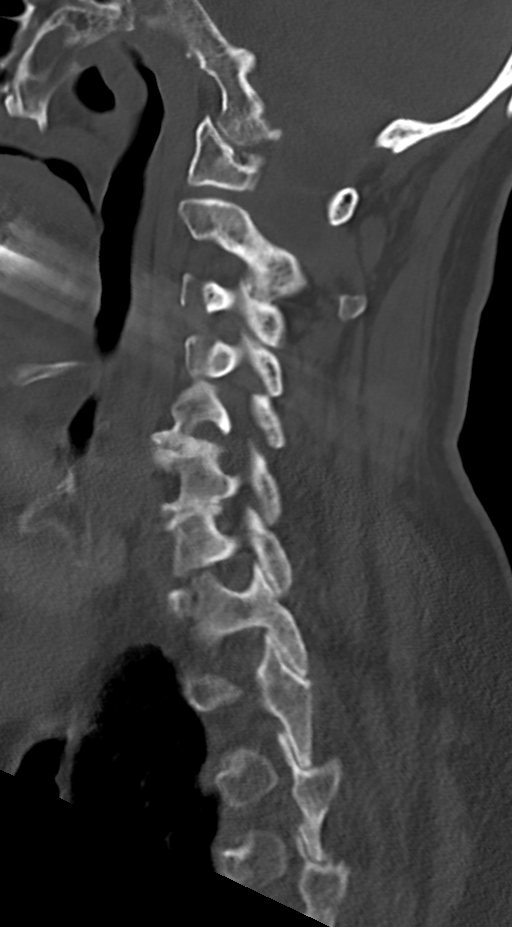
[im 40/80  soft-tissue]
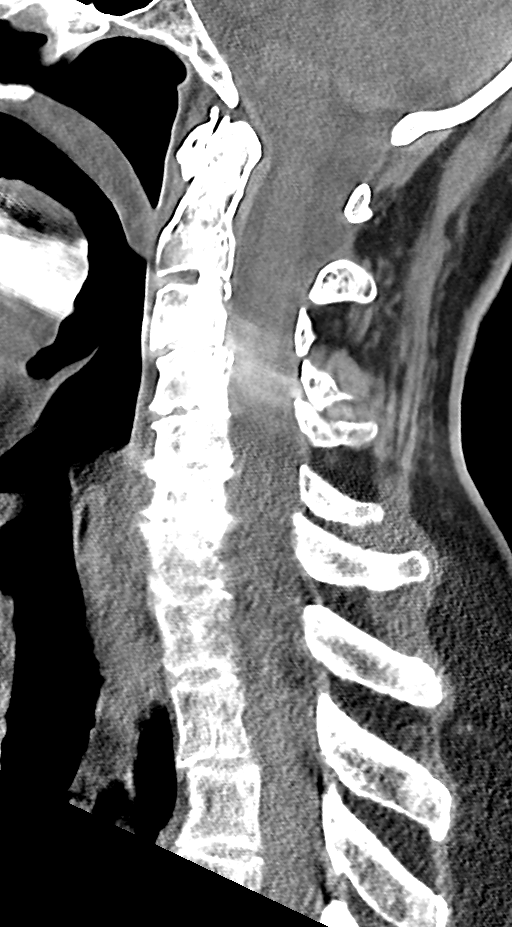
[im 40/80  bone]
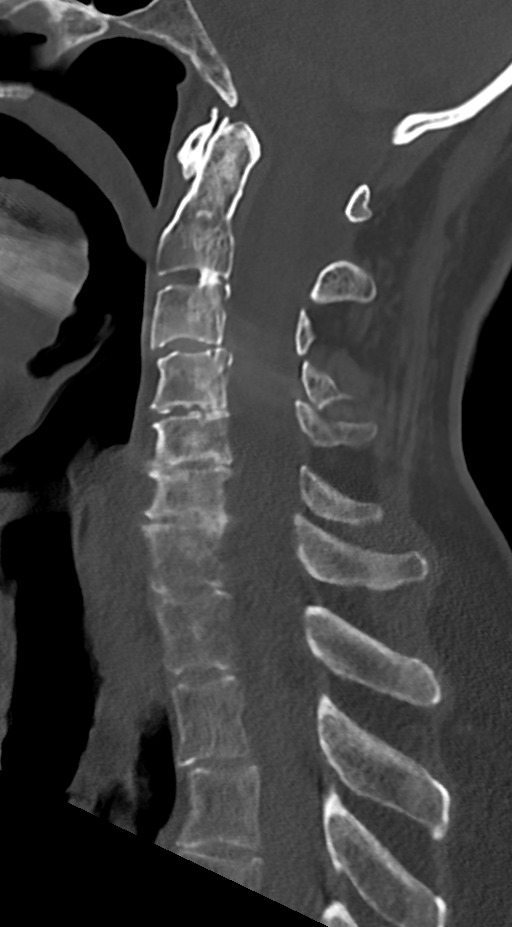
[im 47/80  bone]
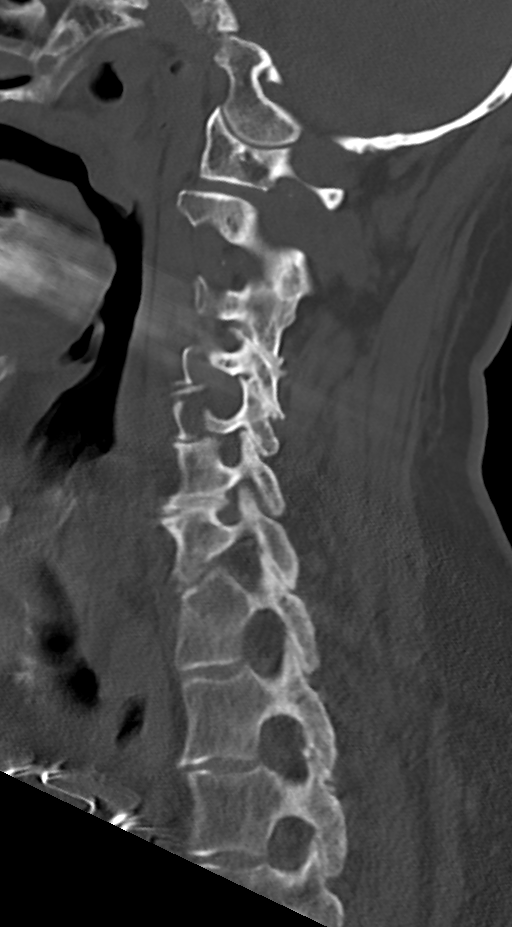
[im 53/80  bone]
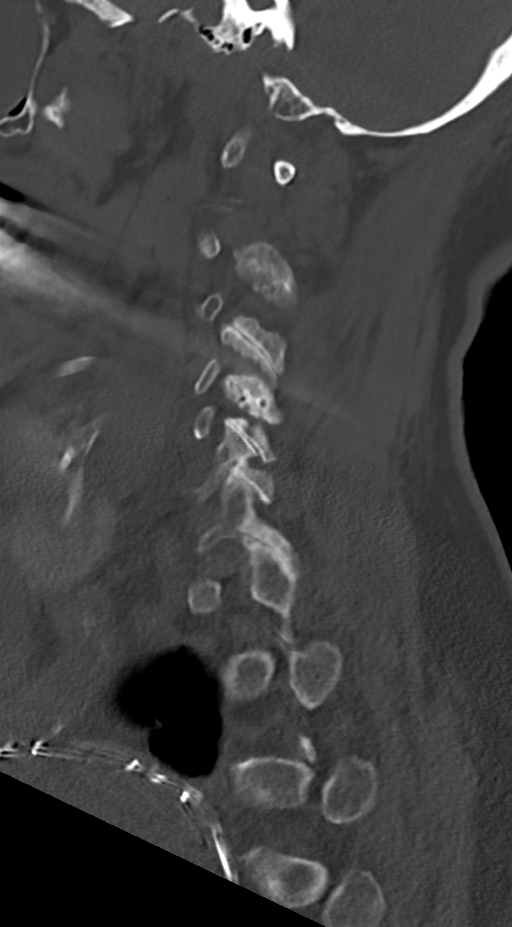

[Series 9: cor bone · coronal · 0.31mm/px · 3 of 74 slices shown]
[im 22/74  bone]
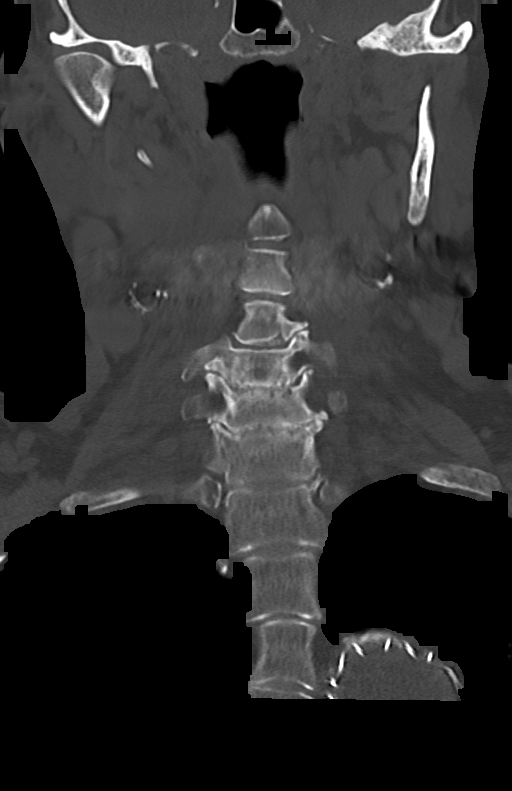
[im 32/74  bone]
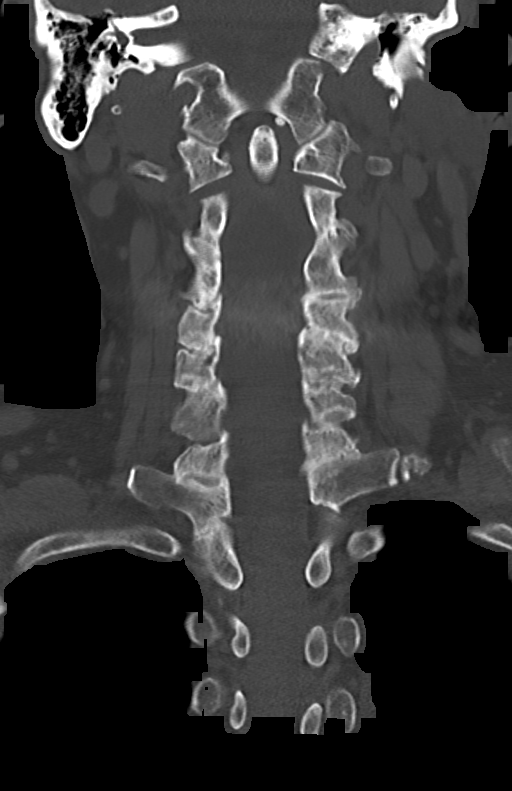
[im 42/74  bone]
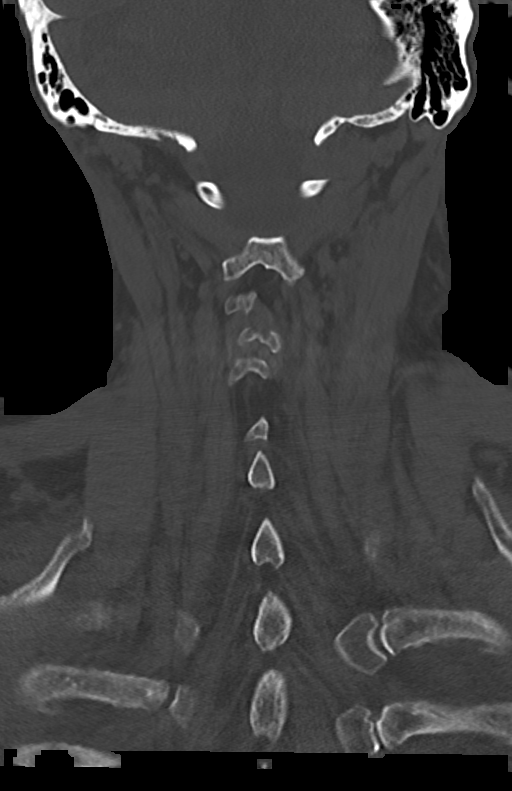

[Series 10: orthogonal axials · axial · 0.21mm/px · z∈[-284,-169]mm · 3 of 99 slices shown, 4 images]
[im 17/99  soft-tissue]
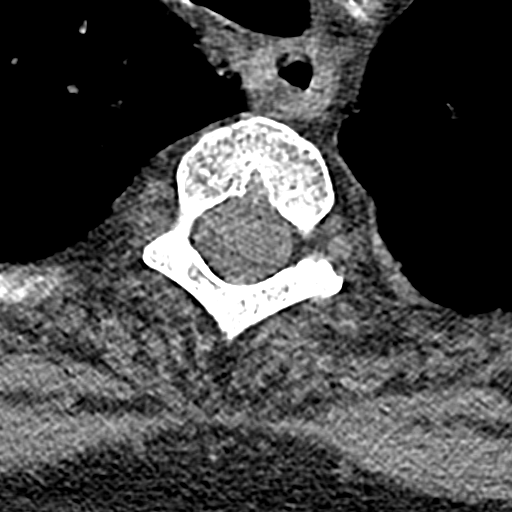
[im 17/99  bone]
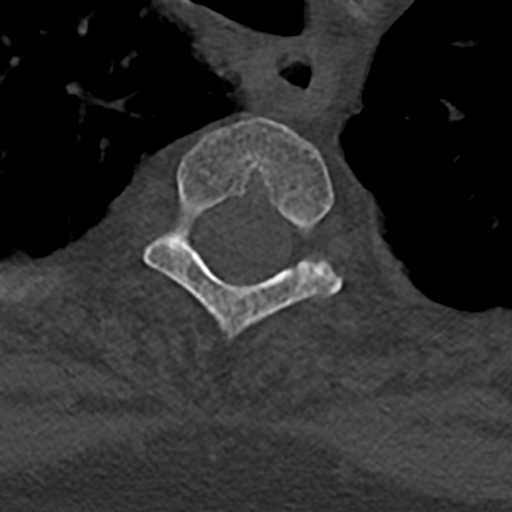
[im 50/99  bone]
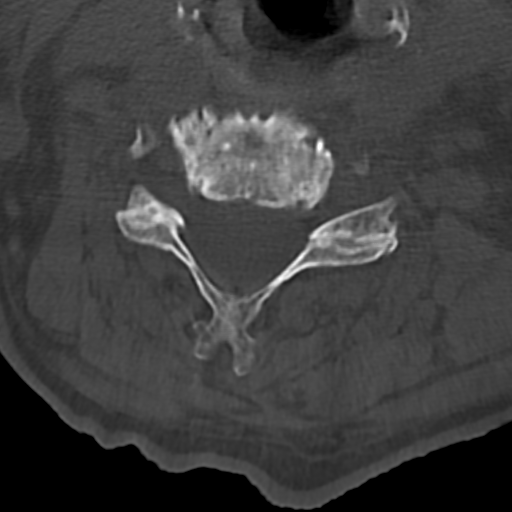
[im 82/99  bone]
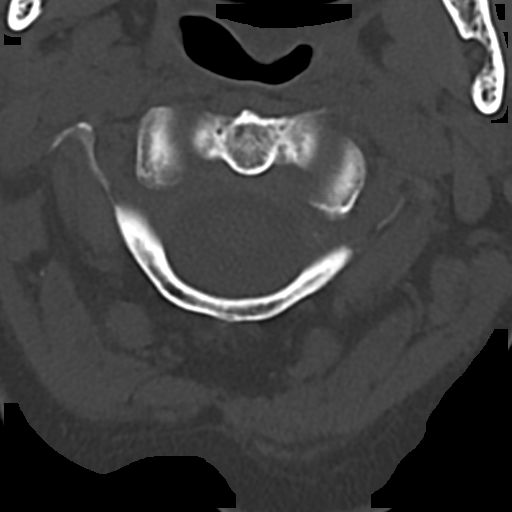

[11 of 33 positions shown; findings below may reference images not displayed]

FINDINGS: Alignment: Straightening of normal lordosis. Trace anterolisthesis
of C3 on C4 is likely degenerative and facet mediated. No traumatic
subluxation.

Skull base and vertebrae: No acute fracture. Vertebral body heights
are maintained. The dens and skull base are intact.

Soft tissues and spinal canal: No prevertebral fluid or swelling. No
visible canal hematoma.

Disc levels: Diffuse disc space narrowing and endplate spurring.
Multilevel facet hypertrophy. No high-grade canal stenosis.

Upper chest: Assessed on concurrent chest CT, reported separately.

Other: Carotid atherosclerosis.
IMPRESSION: Multilevel degenerative disc disease and facet hypertrophy. No acute
fracture or traumatic subluxation of the cervical spine.

## 2022-01-28 IMAGING — DX DG CHEST 1V PORT
1 series · 1 of 1 positions shown · non-contrast
Comparison: CT 05/02/2018.

CLINICAL DATA: Fall. Patient on blood thinners. Shortness of
breath.

EXAM:
PORTABLE CHEST 1 VIEW

[chest]
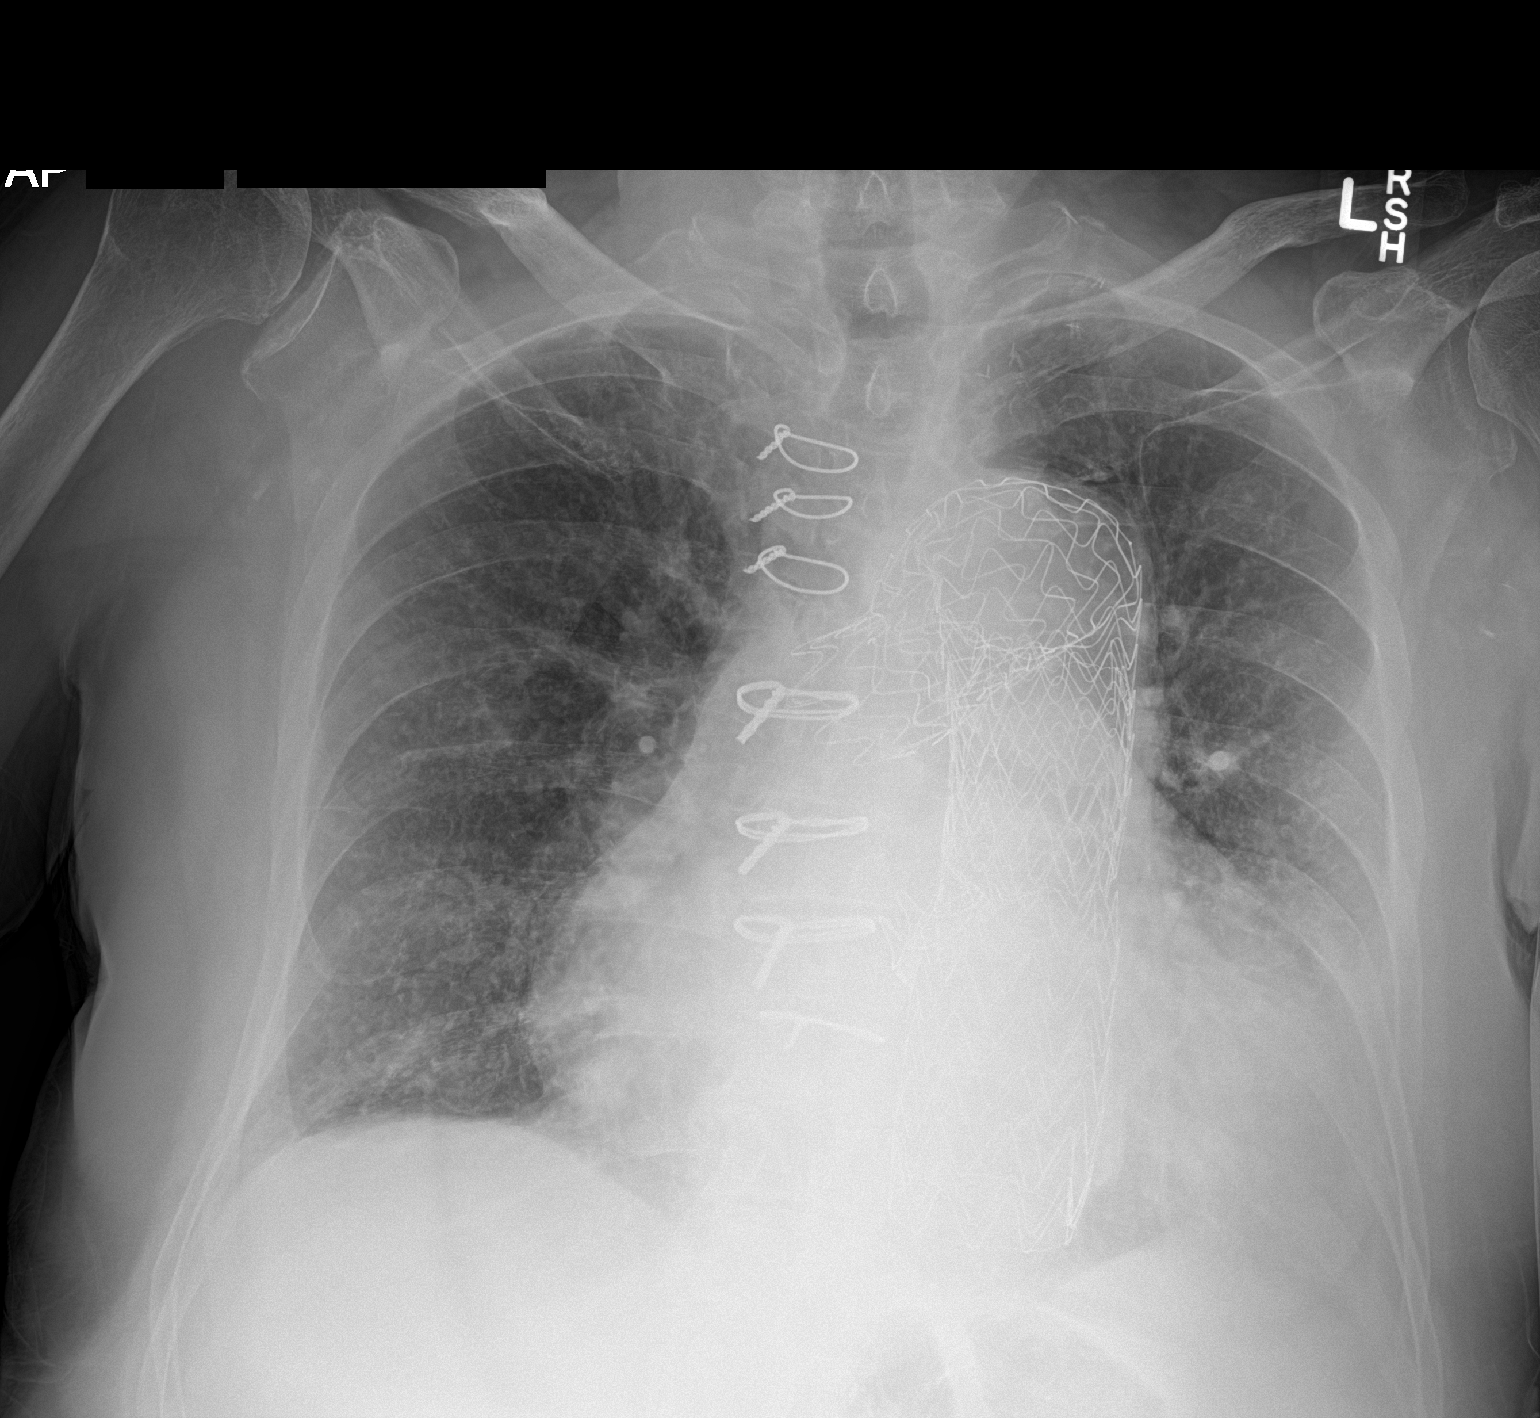

[1 of 1 positions shown; findings below may reference images not displayed]

FINDINGS: Prior median sternotomy. Aortic stent graft appears stable. Stable
cardiomegaly with bilateral interstitial prominence suggesting CHF.
Pneumonitis cannot be excluded. Small bilateral pleural effusions
cannot be excluded. No pneumothorax. No acute bony abnormality
identified.
IMPRESSION: Prior median sternotomy. Aortic stent graft appears stable.
Cardiomegaly with bilateral interstitial prominence suggesting CHF.
Pneumonitis can not be excluded. Small bilateral pleural effusions
cannot be excluded.

## 2022-01-29 IMAGING — DX DG CHEST 1V PORT
1 series · 1 of 1 positions shown · non-contrast
Comparison: September 04, 2019 chest radiograph and chest CT

CLINICAL DATA: Recent fall with rib trauma and shortness of breath

EXAM:
PORTABLE CHEST 1 VIEW

[chest ap]
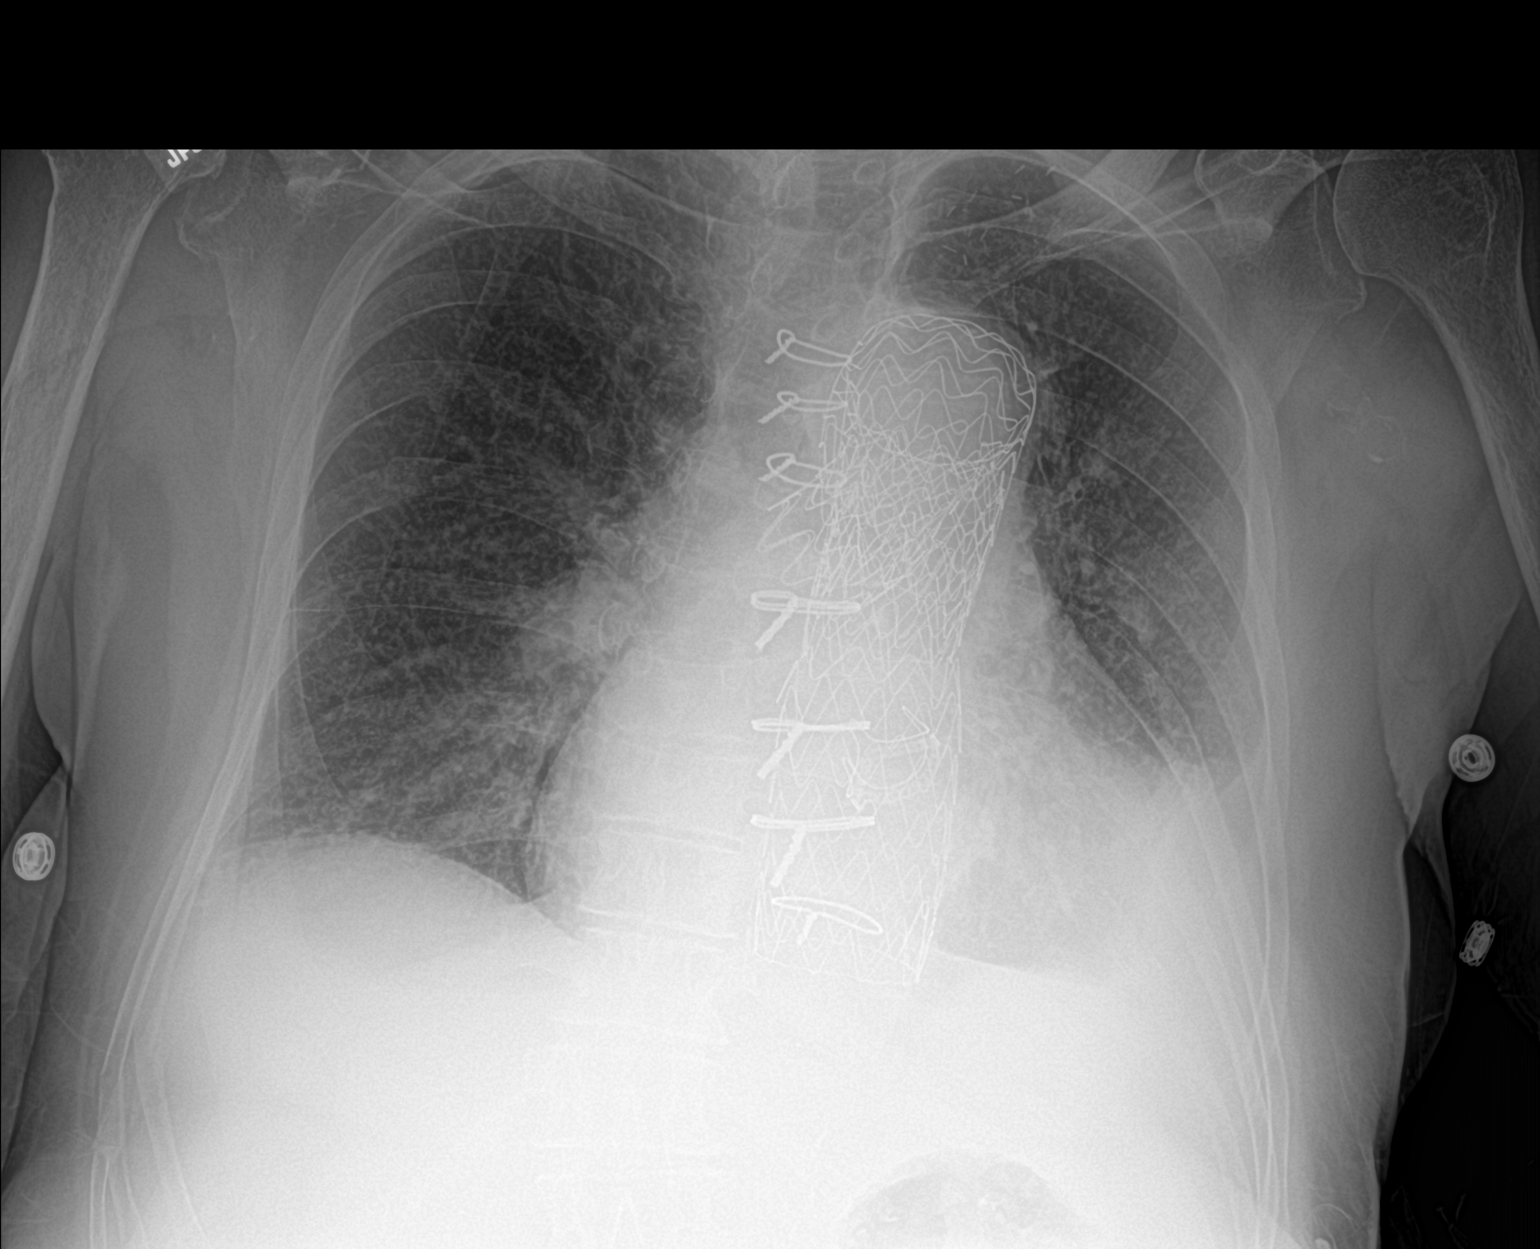

[1 of 1 positions shown; findings below may reference images not displayed]

FINDINGS: There is persistent consolidation in the left lower lobe with
effusion. No new opacity evident. Heart is enlarged, stable. There
is an aortic stent graft which appears grossly stable.

Bones are somewhat osteoporotic. Fractures of the posterior right
seventh and eighth rib fractures noted. No pneumothorax.
IMPRESSION: Persistent consolidation with effusion left base. No new opacity
evident. Stable cardiac silhouette. Aortic stent graft. Rib
fractures on the right without appreciable pneumothorax. Bones
appear somewhat osteoporotic.

## 2022-03-14 ENCOUNTER — Telehealth: Payer: Self-pay | Admitting: Interventional Cardiology

## 2022-03-14 NOTE — Telephone Encounter (Signed)
I spoke with patient who gave permission to speak with his wife.  Wife reports patient has swelling in his lower legs. Will develop blisters at times that she keeps wrapped.  No infection noted.  Blisters will heal then return again.  Started about a month ago. Patient has been taking one extra lasix for 3 days several times and it will help for a short time but then swelling returns.  Patient is keeping legs elevated.  No shortness of breath.  Patient is due for follow up with Dr Irish Lack.  Appointment scheduled for 03/17/22 at 11:40

## 2022-03-14 NOTE — Telephone Encounter (Signed)
Pt c/o swelling: STAT is pt has developed SOB within 24 hours  How much weight have you gained and in what time span?  No major weight gain, weight is stable   If swelling, where is the swelling located?  Feet and legs + water blisters around knees   Are you currently taking a fluid pill?  Lasix 40 MG once daily, Potassium 20 MEQ once daily   Are you currently SOB?  No   Do you have a log of your daily weights (if so, list)?  No log available   Have you gained 3 pounds in a day or 5 pounds in a week?    Have you traveled recently?  No

## 2022-03-16 NOTE — Progress Notes (Signed)
Cardiology Office Note   Date:  03/17/2022   ID:  [name] [name], [name] [name] 02-May-1941, MRN [id number]  PCP:  Street, Sharon Mt, MD    No chief complaint on file.  CAD  Wt Readings from Last 3 Encounters:  03/17/22 201 lb 3.2 oz (91.3 kg)  08/29/21 199 lb (90.3 kg)  05/03/21 198 lb (89.8 kg)       History of Present Illness: [name] [name] is a 81 y.o. male   with h/o aortic disection, CAD - DES to circumflex in Sept 2011. 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 He was admitted to the hospital on 10/05/13 for urosepsis and was on the ventilator for 2 days. Also has thoracentesis for pleural effusion (1200cc).    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  He had a retinal vein thrombosis apparently, per the patient's report.   In 4/21, he was admitted with heart failure. Echo showed: "Left ventricular ejection fraction, by estimation, is 25 to 30%.  The left ventricle has severely decreased function. The left ventricle  demonstrates global hypokinesis."    Cath showed: "Chronic total occlusion of the mid right coronary, collateralized right to right and left to right. Widely patent left main. Widely patent LAD with moderate luminal irregularities proximal to distal. Widely patent stent in the proximal to mid circumflex with up to 50% narrowing distal to the stent. Moderate pulmonary hypertension.  Mean pulmonary artery pressure 35 mmHg. Mean pulmonary capillary wedge pressure 32 mmHg.   RECOMMENDATIONS:   Left ventricular systolic dysfunction is not on the basis of coronary disease.  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    More recently, history includes: " incarcerated left inguinal hernia s/p repair on 12/02/2019, presents to ED with abd pain. He was found to  have partial colonic obstruction. General surgery consulted. Cardiology consulted for atrial flutter and pre op clearance. Patient Underwent LAP  Repair of the recurrent inguinal hernia by Dr Redmond Pulling on 12/18/19."   IV Amio was used for rate controlled while he was NPO.    He had a seroma that was draining.  This resolved.    Had a pressure sore area  On his heel.  Had home health for 9 months.    Son passed away in 07-04-2023 after a bleeding episode after cath/PCI. He had surgery.  A month later, he fell at home and died there in his recliner. He also had Addisons.  He was in [place].  He did well in 2022.  He was walking regularly.  He has had some increased swelling in the past few months.  He had a fall out of bed as well and banged his leg.  Some blisters are on the leg, clear drainage.    Likes to go shopping.  Walks in stores. No problems with that activity. He now has 6 great grandchildren.    Denies : Chest pain. Dizziness. Nitroglycerin use. Orthopnea. Palpitations. Paroxysmal nocturnal dyspnea. Shortness of breath. Syncope.        Past Medical History:  Diagnosis Date   Acute on chronic diastolic heart failure ([facility]) 08/22/2013   Acute on chronic respiratory failure with hypoxia ([place]) 10/03/2019   Acute on chronic systolic heart failure ([place]) 05/11/2013   EF from 35% to 50-55% on 6/14  ECHO.    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 No evidence of dissection or endoleak.  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 No evidence of abdominal aortic dissection.  Occluded proximal left subclav   Aortic valve  insufficiency    Arthritis    "probably in my knees before they replaced them" (01/28/2015)   Atrial fibrillation (HCC) 05/21/2013   Atrial flutter (HCC)    Bradycardia 08/23/2013   CAD (coronary artery disease)    Chest pain 01/28/2015   CHF (congestive heart failure) (HCC)    Chronic diastolic heart failure (HCC) 01/28/2015   Descending aortic aneurysm (HCC)    DVT (deep venous thrombosis) (HCC)    ?LLE post knee surgery   Dyspnea 09/2019   HCAP (healthcare-associated pneumonia) 10/11/2013   History of blood transfusion    "after one of my knee surgeries"   HTN (hypertension)    Hyperlipidemia    Hypotension 12/17/2019   Incarcerated left inguinal hernia s/p repair 12/02/2019 12/01/2019   Insomnia 09/04/2013   Multiple fractures of ribs, right side, init for clos fx 09/04/2019   Near syncope 01/28/2015   Pressure injury of skin 10/06/2019   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 12/18/2019 12/17/2019   Recurrent UTI 10/06/2013   Shock circulatory (HCC) 10/07/2013   Thoracic aneurysm    Tobacco abuse    Urinary retention 10/06/2013   UTI (urinary tract infection) 10/06/2013    Past Surgical History:  Procedure Laterality Date   AORTIC VALVE REPLACEMENT  02/19/2009   AORTO-FEMORAL BYPASS GRAFT  03/2010   ascending aortic and arch aneurysm repair/notes 04/09/2009   CARDIAC VALVE REPLACEMENT     CAROTID-SUBCLAVIAN BYPASS GRAFT Left 08/26/2013   Procedure: BYPASS GRAFT CAROTID-SUBCLAVIAN;  Surgeon: Nada Libman, MD;  Location: [facility] OR;  Service: Vascular;  Laterality: Left;   CATARACT EXTRACTION W/ INTRAOCULAR LENS  IMPLANT, BILATERAL Bilateral    ENDOVASCULAR STENT INSERTION N/A 08/26/2013   Procedure:  THORACIC STENT GRAFT INSERTION;  Surgeon: Nada Libman, MD;  Location: [facility] OR;  Service: Vascular;  Laterality: N/A;   EYE SURGERY     INGUINAL HERNIA REPAIR Left 12/02/2019   Procedure: REPAIR LEFT INGUINAL HERNIA WITH MESH;  Surgeon: Violeta Gelinas, MD;   Location: [facility] OR;  Service: General;  Laterality: Left;   INGUINAL HERNIA REPAIR Left 12/18/2019   Procedure: LAPAROSCOPIC ASSISTED REPAIR OF RECURRENT INCARCERATED LEFT INGUINAL HERNIA WITH MESH; LAPAOSCOPIC REPAIR OF SEROSAL TEAR;  Surgeon: Gaynelle Adu, MD;  Location: [facility] OR;  Service: General;  Laterality: Left;   INSERTION OF MESH Left 12/02/2019   Procedure: INSERTION OF MESH;  Surgeon: Violeta Gelinas, MD;  Location: [facility] OR;  Service: General;  Laterality: Left;   JOINT REPLACEMENT     KNEE ARTHROSCOPY Bilateral    REPLACEMENT TOTAL KNEE Bilateral    right groin lymphocele Right 03/29/2009   RIGHT/LEFT HEART CATH AND CORONARY ANGIOGRAPHY N/A 10/06/2019   Procedure: RIGHT/LEFT HEART CATH AND CORONARY ANGIOGRAPHY;  Surgeon: Lyn Records, MD;  Location: [facility] INVASIVE CV LAB;  Service: Cardiovascular;  Laterality: N/A;   THORACENTESIS  2010 X 2   TONSILLECTOMY  Current Outpatient Medications  Medication Sig Dispense Refill   acetaminophen (TYLENOL) 500 MG tablet Take 2 tablets (1,000 mg total) by mouth every 6 (six) hours as needed. 30 tablet 0   amiodarone (PACERONE) 200 MG tablet Take 1 tablet by mouth once daily 30 tablet 9   amoxicillin (AMOXIL) 500 MG capsule Take 4 capsules by mouth 30-60 min prior to dental procedure. 4 capsule 3   apixaban (ELIQUIS) 5 MG TABS tablet Take 1 tablet (5 mg total) by mouth 2 (two) times daily. 180 tablet 3   atorvastatin (LIPITOR) 10 MG tablet Take 1 tablet (10 mg total) by mouth daily. 90 tablet 3   ferrous sulfate (SV IRON) 325 (65 FE) MG tablet Take 1 tablet (325 mg total) by mouth daily with breakfast. Pt. Needs to make an appt. With [name] in order to receive future refills. Thank You. 1st Attempt. 30 tablet 0   furosemide (LASIX) 40 MG tablet Take 1 tablet (40 mg total) by mouth daily. 90 tablet 3   lansoprazole (PREVACID) 30 MG capsule Take 30 mg by mouth daily.     levocetirizine (XYZAL) 5 MG tablet Take 5 mg by mouth daily.     losartan  (COZAAR) 25 MG tablet Take 1 tablet by mouth once daily 90 tablet 2   metoprolol succinate (TOPROL-XL) 100 MG 24 hr tablet TAKE 1 TABLET BY MOUTH ONCE DAILY TAKE  WITH  OR  INNEDIATELY  FOLLOWING  A  MEAL 90 tablet 2   Multiple Vitamin (MULTIVITAMIN WITH MINERALS) TABS tablet Take 1 tablet by mouth daily.     Multiple Vitamins-Minerals (ZINC PO) Take 1 tablet by mouth daily at 6 (six) AM.     potassium chloride SA (KLOR-CON M) 20 MEQ tablet Take 2 tablets by mouth once daily 60 tablet 11   spironolactone (ALDACTONE) 25 MG tablet Take 1/2 (one-half) tablet by mouth once daily 45 tablet 3   No current facility-administered medications for this visit.    Allergies:   Lortab [hydrocodone-acetaminophen], Morphine and related, and Other    Social History:  The patient  reports that he quit smoking about 47 years ago. His smoking use included cigarettes. He has never used smokeless tobacco. He reports that he does not drink alcohol and does not use drugs.   Family History:  The patient's family history includes Aortic aneurysm in his father; Celiac disease in his daughter; Hypertension in his mother.    ROS:  Please see the history of present illness.   Otherwise, review of systems are positive for lower extremity edema.   All other systems are reviewed and negative.    PHYSICAL EXAM: VS:  BP (!) 144/78   Pulse 82   Ht 5\' 10"  (1.778 m)   Wt 201 lb 3.2 oz (91.3 kg)   SpO2 94%   BMI 28.87 kg/m  , BMI Body mass index is 28.87 kg/m. GEN: Well nourished, well developed, in no acute distress HEENT: normal Neck: no JVD, carotid bruits, or masses Cardiac: Irregularly irregular; no murmurs, rubs, or gallops,; bilateral pitting lower extremity edema, dressings in place Respiratory:  clear to auscultation bilaterally, normal work of breathing GI: soft, nontender, nondistended, + BS MS: no deformity or atrophy Skin: warm and dry, ; venous stasis rash on legs Neuro:  Strength and sensation are  intact Psych: euthymic mood, full affect   EKG:   The ekg ordered today demonstrates atrial fibrillation, IVCD, rate controlled, heart rate mildly increased compared to 2022 ECG  Recent Labs: 05/03/2021: ALT 22; BUN 34; Creatinine, Ser 1.49; Hemoglobin 10.5; Platelets 209; Potassium 5.0; Sodium 140; TSH 3.450   Lipid Panel    Component Value Date/Time   CHOL 155 07/13/2020 1103   TRIG 176 (H) 07/13/2020 1103   HDL 46 07/13/2020 1103   CHOLHDL 3.4 07/13/2020 1103   CHOLHDL 3.6 07/29/2010 0210   VLDL 18 07/29/2010 0210   LDLCALC 79 07/13/2020 1103     Other studies Reviewed: Additional studies/ records that were reviewed today with results demonstrating: labs reviewed; K 5.0, Cr 1.49 in 04/2021.   ASSESSMENT AND PLAN:  CAD: No angina on medical therapy.  Continue aggressive secondary prevention. Status post AVR: Using SBE prophylaxis.  Permanent AF: Rate controlled. No sx. has been on amiodarone for effective rate control.  Due to some low blood pressure readings and difficulty tolerating other medications, rate control has been a challenge in the past.  Will check LFTs and TSH. Chronic systolic/diastolic heart failure: Mild lower extremity edema.  I suspect this is in part from venous insufficiency.  No excess fluid in the chest cavity.  Okay to increase Lasix to 40 mg p.o. twice daily as needed.  He can adjust whether he takes 1 or 2 tablets.  We will check labs today to determine whether or not he will need additional potassium.  Potassium in the past has been at the high end of the normal range. 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 BP readings at home are in the 125-130 systolic range.    Current medicines are reviewed at length with the patient today.  The patient concerns regarding his medicines were addressed.  The following changes have been made:  No change  Labs/ tests ordered today include:  No orders of the defined types  were placed in this encounter.   Recommend 150 minutes/week of aerobic exercise Low fat, low carb, high fiber diet recommended  Disposition:   FU in 9 months   Signed, Lance Muss, MD  03/17/2022 12:05 PM    [facility] [address] [place], [place], [place]  [address] Phone: [phone]; Fax: [phone]

## 2022-03-17 ENCOUNTER — Ambulatory Visit: Payer: Medicare Other | Attending: Interventional Cardiology | Admitting: Interventional Cardiology

## 2022-03-17 ENCOUNTER — Encounter: Payer: Self-pay | Admitting: Interventional Cardiology

## 2022-03-17 VITALS — BP 144/78 | HR 82 | Ht 70.0 in | Wt 201.2 lb

## 2022-03-17 DIAGNOSIS — I2510 Atherosclerotic heart disease of native coronary artery without angina pectoris: Secondary | ICD-10-CM | POA: Insufficient documentation

## 2022-03-17 DIAGNOSIS — Z7901 Long term (current) use of anticoagulants: Secondary | ICD-10-CM | POA: Diagnosis not present

## 2022-03-17 DIAGNOSIS — I480 Paroxysmal atrial fibrillation: Secondary | ICD-10-CM | POA: Diagnosis not present

## 2022-03-17 DIAGNOSIS — Z952 Presence of prosthetic heart valve: Secondary | ICD-10-CM | POA: Insufficient documentation

## 2022-03-17 DIAGNOSIS — I5041 Acute combined systolic (congestive) and diastolic (congestive) heart failure: Secondary | ICD-10-CM | POA: Diagnosis not present

## 2022-03-17 DIAGNOSIS — E785 Hyperlipidemia, unspecified: Secondary | ICD-10-CM | POA: Diagnosis not present

## 2022-03-17 MED ORDER — FERROUS SULFATE 325 (65 FE) MG PO TABS: 325.0000 mg | ORAL_TABLET | Freq: Every day | ORAL | 3 refills | Status: DC

## 2022-03-17 MED ORDER — FUROSEMIDE 40 MG PO TABS: 40.0000 mg | ORAL_TABLET | Freq: Two times a day (BID) | ORAL | 3 refills | Status: DC | PRN

## 2022-03-17 NOTE — Patient Instructions (Addendum)
Medication Instructions:  Your physician has recommended you make the following change in your medication: Increase furosemide to 40 mg twice daily as needed  *If you need a refill on your cardiac medications before your next appointment, please call your pharmacy*   Lab Work: Lab work to be done today--CMET, TSH, CBC, Lipids If you have labs (blood work) drawn today and your tests are completely normal, you will receive your results only by: [place] (if you have MyChart) OR A paper copy in the mail If you have any lab test that is abnormal or we need to change your treatment, we will call you to review the results.   Testing/Procedures: none   Follow-Up: At [facility], you and your health needs are our priority.  As part of our continuing mission to provide you with exceptional heart care, we have created designated Provider Care Teams.  These Care Teams include your primary Cardiologist (physician) and Advanced Practice Providers (APPs -  Physician Assistants and Nurse Practitioners) who all work together to provide you with the care you need, when you need it.  We recommend signing up for the patient portal called "MyChart".  Sign up information is provided on this After Visit Summary.  MyChart is used to connect with patients for Virtual Visits (Telemedicine).  Patients are able to view lab/test results, encounter notes, upcoming appointments, etc.  Non-urgent messages can be sent to your provider as well.   To learn more about what you can do with MyChart, go to [email or web address].    Your next appointment:   May/June 2024  The format for your next appointment:   In Person  Provider:   Larae Grooms, MD     Other Instructions   Important Information About Sugar

## 2022-03-18 ENCOUNTER — Telehealth: Payer: Self-pay | Admitting: Student

## 2022-03-18 DIAGNOSIS — Z79899 Other long term (current) drug therapy: Secondary | ICD-10-CM

## 2022-03-18 DIAGNOSIS — I71019 Dissection of thoracic aorta, unspecified: Secondary | ICD-10-CM

## 2022-03-18 DIAGNOSIS — I5033 Acute on chronic diastolic (congestive) heart failure: Secondary | ICD-10-CM

## 2022-03-18 LAB — COMPREHENSIVE METABOLIC PANEL
ALT: 25 IU/L (ref 0–44)
AST: 36 IU/L (ref 0–40)
Albumin/Globulin Ratio: 1.6 (ref 1.2–2.2)
Albumin: 4.4 g/dL (ref 3.7–4.7)
Alkaline Phosphatase: 80 IU/L (ref 44–121)
BUN/Creatinine Ratio: 28 — ABNORMAL HIGH (ref 10–24)
BUN: 41 mg/dL — ABNORMAL HIGH (ref 8–27)
Bilirubin Total: 0.3 mg/dL (ref 0.0–1.2)
CO2: 23 mmol/L (ref 20–29)
Calcium: 9.3 mg/dL (ref 8.6–10.2)
Chloride: 103 mmol/L (ref 96–106)
Creatinine, Ser: 1.48 mg/dL — ABNORMAL HIGH (ref 0.76–1.27)
Globulin, Total: 2.7 g/dL (ref 1.5–4.5)
Glucose: 103 mg/dL — ABNORMAL HIGH (ref 70–99)
Potassium: 6.4 mmol/L (ref 3.5–5.2)
Sodium: 141 mmol/L (ref 134–144)
Total Protein: 7.1 g/dL (ref 6.0–8.5)
eGFR: 47 mL/min/{1.73_m2} — ABNORMAL LOW (ref >59)

## 2022-03-18 LAB — CBC
Hematocrit: 32 % — ABNORMAL LOW (ref 37.5–51.0)
Hemoglobin: 10.3 g/dL — ABNORMAL LOW (ref 13.0–17.7)
MCH: 32 pg (ref 26.6–33.0)
MCHC: 32.2 g/dL (ref 31.5–35.7)
MCV: 99 fL — ABNORMAL HIGH (ref 79–97)
Platelets: 207 10*3/uL (ref 150–450)
RBC: 3.22 x10E6/uL — ABNORMAL LOW (ref 4.14–5.80)
RDW: 13.9 % (ref 11.6–15.4)
WBC: 4.8 10*3/uL (ref 3.4–10.8)

## 2022-03-18 LAB — LIPID PANEL
Chol/HDL Ratio: 3.5 ratio (ref 0.0–5.0)
Cholesterol, Total: 157 mg/dL (ref 100–199)
HDL: 45 mg/dL (ref >39)
LDL Chol Calc (NIH): 86 mg/dL (ref 0–99)
Triglycerides: 152 mg/dL — ABNORMAL HIGH (ref 0–149)
VLDL Cholesterol Cal: 26 mg/dL (ref 5–40)

## 2022-03-18 LAB — TSH: TSH: 3.5 u[IU]/mL (ref 0.450–4.500)

## 2022-03-18 NOTE — Telephone Encounter (Signed)
Telephone encounter:  Received call from [place] that K was 6.4 on CMP yesterday. Reviewed old CMPs, generally patient's K is 4-5. Reviewed old cardiology notes, patient was recently started on higher dose lasix. Is on spironolactone and losartan as well (but these are not new meds). Etiology for hyperkalemia is unclear in the setting of recently increased lasix dose.  Called patient, spoke to his wife. She reports he is doing well. Does not feel dizzy, palpitations, chest pain, or SOB. Discussed hyperkalemia with her and told her that he should take an extra lasix dose and hold spironolactone until he hears from Dr. Irish Lack.  Recommendations: - Take extra lasix dose  - Hold spironolactone until Dr. Irish Lack reaches out to them - Discussed with patient's wife that Dr. Irish Lack might ask for repeat labs    Loel Dubonnet MD MPH [facility] Cardiology

## 2022-03-20 NOTE — Telephone Encounter (Signed)
I spoke with the pt and apologized for my error... he needs to have an ECG and not a BMET... I spoke with Delfino Lovett and he set the pt up for tomorrow at 10 am at the [place] office.

## 2022-03-20 NOTE — Addendum Note (Signed)
Addended by: Stephani Police on: 03/20/2022 09:41 AM   Modules accepted: Orders

## 2022-03-20 NOTE — Telephone Encounter (Signed)
Pts wife advised and will have labs today at the [place] office.

## 2022-03-21 ENCOUNTER — Ambulatory Visit: Payer: Medicare Other | Attending: Cardiology

## 2022-03-21 VITALS — BP 122/64 | HR 72 | Ht 70.0 in | Wt 199.4 lb

## 2022-03-21 DIAGNOSIS — I2510 Atherosclerotic heart disease of native coronary artery without angina pectoris: Secondary | ICD-10-CM | POA: Insufficient documentation

## 2022-03-21 NOTE — Progress Notes (Signed)
   Nurse Visit   Date of Encounter: 03/21/2022 ID: [name] [name], DOB [birth year], MRN [id number]  PCP:  Street, Sharon Mt, MD   [facility] Providers Cardiologist:  Larae Grooms, MD      Visit Details   VS:  BP 122/64 (BP Location: Left Arm, Patient Position: Sitting)   Pulse 72   Ht 5\' 10"  (1.778 m)   Wt 199 lb 6.4 oz (90.4 kg)   SpO2 92%   BMI 28.61 kg/m  , BMI Body mass index is 28.61 kg/m.  Wt Readings from Last 3 Encounters:  03/21/22 199 lb 6.4 oz (90.4 kg)  03/17/22 201 lb 3.2 oz (91.3 kg)  08/29/21 199 lb (90.3 kg)     Reason for visit: Perform an EKG and lab draw due to patient having a potassium level of 6.4 Performed today: Vitals, EKG, Lab Draw , Provider consulted and Education Changes (medications, testing, etc.) : No medication changes or new orders at this time Length of Visit: 20 minutes    Medications Adjustments/Labs and Tests Ordered: Orders Placed This Encounter  Procedures   Basic metabolic panel   EKG 15-BWIO   No orders of the defined types were placed in this encounter.    Signed, Louie Casa, RN  03/21/2022 11:43 AM

## 2022-03-22 LAB — BASIC METABOLIC PANEL
BUN/Creatinine Ratio: 28 — ABNORMAL HIGH (ref 10–24)
BUN: 44 mg/dL — ABNORMAL HIGH (ref 8–27)
CO2: 21 mmol/L (ref 20–29)
Calcium: 8.8 mg/dL (ref 8.6–10.2)
Chloride: 100 mmol/L (ref 96–106)
Creatinine, Ser: 1.56 mg/dL — ABNORMAL HIGH (ref 0.76–1.27)
Glucose: 94 mg/dL (ref 70–99)
Potassium: 5 mmol/L (ref 3.5–5.2)
Sodium: 141 mmol/L (ref 134–144)
eGFR: 44 mL/min/{1.73_m2} — ABNORMAL LOW (ref >59)

## 2022-03-23 ENCOUNTER — Telehealth: Payer: Self-pay | Admitting: Interventional Cardiology

## 2022-03-23 DIAGNOSIS — E875 Hyperkalemia: Secondary | ICD-10-CM

## 2022-03-23 DIAGNOSIS — I480 Paroxysmal atrial fibrillation: Secondary | ICD-10-CM

## 2022-03-23 NOTE — Telephone Encounter (Signed)
[name] wife aware of lab results ./cy 

## 2022-03-23 NOTE — Telephone Encounter (Signed)
.  Pt c/o medication issue:  1. Name of Medication: Spironolactone and Potassium  2. How are you currently taking this medication (dosage and times per day)?   3. Are you having a reaction (difficulty breathing--STAT)?   4. What is your medication issue? They had stopped these medicines, because his Potassium was too high- when can he start back on them?n

## 2022-03-23 NOTE — Telephone Encounter (Signed)
Park Liter, MD  03/22/2022  8:20 PM EDT Back to Top    Potassium normalized, continue present management, recheck Chem-7 in 3 weeks

## 2022-03-24 ENCOUNTER — Telehealth: Payer: Self-pay

## 2022-03-24 DIAGNOSIS — E875 Hyperkalemia: Secondary | ICD-10-CM

## 2022-03-24 NOTE — Telephone Encounter (Signed)
Pts wife aware of results and will come back in 3 weeks.

## 2022-03-24 NOTE — Telephone Encounter (Signed)
-----   Message from Park Liter, MD sent at 03/22/2022  8:20 PM EDT ----- Potassium normalized, continue present management, recheck Chem-7 in 3 weeks

## 2022-03-27 ENCOUNTER — Telehealth: Payer: Self-pay | Admitting: Interventional Cardiology

## 2022-03-27 NOTE — Telephone Encounter (Signed)
Pt c/o medication issue:  1. Name of Medication:  spironolactone (ALDACTONE) 25 MG tablet potassium chloride SA (KLOR-CON M) 20 MEQ tablet  2. How are you currently taking this medication (dosage and times per day)?  Not sure if he should be taking it  3. Are you having a reaction (difficulty breathing--STAT)? no  4. What is your medication issue? Patient's wife states Dr. Hassell Done office told him to not take either medication for 3 weeks and then get labs. She says the [place] office told him to go back on the medications and get labs drawn in 3 weeks. She says they are not sure whether he takes the medications or not.

## 2022-03-27 NOTE — Telephone Encounter (Signed)
I spoke with patient's wife and told her patient should not resume spironolactone and potassium.  Patient will have follow up lab work in [place] in about 3 weeks.

## 2022-04-13 DIAGNOSIS — E875 Hyperkalemia: Secondary | ICD-10-CM | POA: Diagnosis not present

## 2022-04-14 LAB — BASIC METABOLIC PANEL
BUN/Creatinine Ratio: 24 (ref 10–24)
BUN: 33 mg/dL — ABNORMAL HIGH (ref 8–27)
CO2: 26 mmol/L (ref 20–29)
Calcium: 8.8 mg/dL (ref 8.6–10.2)
Chloride: 101 mmol/L (ref 96–106)
Creatinine, Ser: 1.38 mg/dL — ABNORMAL HIGH (ref 0.76–1.27)
Glucose: 97 mg/dL (ref 70–99)
Potassium: 4.2 mmol/L (ref 3.5–5.2)
Sodium: 145 mmol/L — ABNORMAL HIGH (ref 134–144)
eGFR: 51 mL/min/{1.73_m2} — ABNORMAL LOW (ref >59)

## 2022-04-18 ENCOUNTER — Telehealth: Payer: Self-pay | Admitting: Interventional Cardiology

## 2022-04-18 NOTE — Telephone Encounter (Signed)
OK to send his lab results.

## 2022-04-18 NOTE — Telephone Encounter (Signed)
Patient's wife is calling requesting patients 10/26 lab results. Please advise.

## 2022-04-19 ENCOUNTER — Telehealth: Payer: Self-pay

## 2022-04-19 DIAGNOSIS — Z79899 Other long term (current) drug therapy: Secondary | ICD-10-CM

## 2022-04-19 NOTE — Telephone Encounter (Signed)
-----   Message from Park Liter, MD sent at 04/19/2022 11:22 AM EDT ----- Chem-7 looks good, continue present management

## 2022-04-19 NOTE — Telephone Encounter (Signed)
Follow Up:     Patient's wife is calling back to see about patient's results and his medicine please

## 2022-04-19 NOTE — Telephone Encounter (Signed)
Park Liter, MD  04/19/2022 11:22 AM EDT Back to Top    Chem-7 looks good, continue present management   Advised patient's wife of lab results. She would like to know if patient should remain off of the spironolactone and potassium.

## 2022-04-19 NOTE — Telephone Encounter (Signed)
Patient notified via mychart

## 2022-04-20 NOTE — Telephone Encounter (Signed)
I spoke with patient who asked I call back tomorrow and speak with his wife.  Patient confirms he is not taking potassium and spironolactone and knows he should not be taking them at this time.

## 2022-04-20 NOTE — Telephone Encounter (Signed)
Potassium and spironolactone were stopped based on 9/29 lab results.  Patient should not resume these. I placed call to patient but there was no answer

## 2022-04-21 NOTE — Telephone Encounter (Signed)
I spoke with patient's wife and told her patient should not be taking potassium and spironolactone at this time.  Dr Agustin Cree has ordered follow up BMP in [place] office on 11/6.  Wife requests Dr Irish Lack also review these results when available.

## 2022-04-24 DIAGNOSIS — Z79899 Other long term (current) drug therapy: Secondary | ICD-10-CM | POA: Diagnosis not present

## 2022-04-25 LAB — BASIC METABOLIC PANEL
BUN/Creatinine Ratio: 23 (ref 10–24)
BUN: 34 mg/dL — ABNORMAL HIGH (ref 8–27)
CO2: 27 mmol/L (ref 20–29)
Calcium: 9.3 mg/dL (ref 8.6–10.2)
Chloride: 106 mmol/L (ref 96–106)
Creatinine, Ser: 1.51 mg/dL — ABNORMAL HIGH (ref 0.76–1.27)
Glucose: 135 mg/dL — ABNORMAL HIGH (ref 70–99)
Potassium: 5 mmol/L (ref 3.5–5.2)
Sodium: 147 mmol/L — ABNORMAL HIGH (ref 134–144)
eGFR: 46 mL/min/{1.73_m2} — ABNORMAL LOW (ref >59)

## 2022-04-27 NOTE — Telephone Encounter (Signed)
Stable Cr and potassium on most recent labs 11/6

## 2022-04-28 NOTE — Telephone Encounter (Signed)
Patients wife notified

## 2022-05-01 ENCOUNTER — Other Ambulatory Visit: Payer: Self-pay | Admitting: Interventional Cardiology

## 2022-05-22 ENCOUNTER — Other Ambulatory Visit: Payer: Self-pay | Admitting: Interventional Cardiology

## 2022-05-23 MED ORDER — APIXABAN 5 MG PO TABS: 5.0000 mg | ORAL_TABLET | Freq: Two times a day (BID) | ORAL | 1 refills | Status: DC

## 2022-05-23 NOTE — Telephone Encounter (Addendum)
Prescription refill request for Eliquis received. Indication: AF Last office visit: 03/17/22  Catalina Gravel MD Scr: 1.51 on 04/24/22 Age: 81 Weight: 91.3kg  Will forward to PharmD Pool re. Dose reduction Ok to continue Eliquis 5mg  twice daily per PharmD.  Will continue to monitor Scr closely.

## 2022-05-23 NOTE — Addendum Note (Signed)
Addended by: Louanna Raw on: 05/23/2022 10:40 AM   Modules accepted: Orders

## 2022-05-31 DIAGNOSIS — I252 Old myocardial infarction: Secondary | ICD-10-CM | POA: Diagnosis not present

## 2022-05-31 DIAGNOSIS — J811 Chronic pulmonary edema: Secondary | ICD-10-CM | POA: Diagnosis not present

## 2022-05-31 DIAGNOSIS — J810 Acute pulmonary edema: Secondary | ICD-10-CM | POA: Diagnosis not present

## 2022-05-31 DIAGNOSIS — Z79899 Other long term (current) drug therapy: Secondary | ICD-10-CM | POA: Diagnosis not present

## 2022-05-31 DIAGNOSIS — I10 Essential (primary) hypertension: Secondary | ICD-10-CM | POA: Diagnosis not present

## 2022-05-31 DIAGNOSIS — I482 Chronic atrial fibrillation, unspecified: Secondary | ICD-10-CM | POA: Diagnosis present

## 2022-05-31 DIAGNOSIS — I509 Heart failure, unspecified: Secondary | ICD-10-CM | POA: Diagnosis not present

## 2022-05-31 DIAGNOSIS — R059 Cough, unspecified: Secondary | ICD-10-CM | POA: Diagnosis not present

## 2022-05-31 DIAGNOSIS — I4891 Unspecified atrial fibrillation: Secondary | ICD-10-CM | POA: Diagnosis not present

## 2022-05-31 DIAGNOSIS — I429 Cardiomyopathy, unspecified: Secondary | ICD-10-CM | POA: Diagnosis not present

## 2022-05-31 DIAGNOSIS — I700 Atherosclerosis of aorta: Secondary | ICD-10-CM | POA: Diagnosis not present

## 2022-05-31 DIAGNOSIS — Z7901 Long term (current) use of anticoagulants: Secondary | ICD-10-CM | POA: Diagnosis not present

## 2022-05-31 DIAGNOSIS — I340 Nonrheumatic mitral (valve) insufficiency: Secondary | ICD-10-CM | POA: Diagnosis not present

## 2022-05-31 DIAGNOSIS — R0902 Hypoxemia: Secondary | ICD-10-CM | POA: Diagnosis not present

## 2022-05-31 DIAGNOSIS — K219 Gastro-esophageal reflux disease without esophagitis: Secondary | ICD-10-CM | POA: Diagnosis not present

## 2022-05-31 DIAGNOSIS — J101 Influenza due to other identified influenza virus with other respiratory manifestations: Secondary | ICD-10-CM | POA: Diagnosis present

## 2022-05-31 DIAGNOSIS — I2510 Atherosclerotic heart disease of native coronary artery without angina pectoris: Secondary | ICD-10-CM | POA: Diagnosis not present

## 2022-05-31 DIAGNOSIS — Z87891 Personal history of nicotine dependence: Secondary | ICD-10-CM | POA: Diagnosis not present

## 2022-05-31 DIAGNOSIS — J441 Chronic obstructive pulmonary disease with (acute) exacerbation: Secondary | ICD-10-CM | POA: Diagnosis not present

## 2022-05-31 DIAGNOSIS — I517 Cardiomegaly: Secondary | ICD-10-CM | POA: Diagnosis not present

## 2022-05-31 DIAGNOSIS — R0602 Shortness of breath: Secondary | ICD-10-CM | POA: Diagnosis not present

## 2022-05-31 DIAGNOSIS — J9600 Acute respiratory failure, unspecified whether with hypoxia or hypercapnia: Secondary | ICD-10-CM | POA: Diagnosis not present

## 2022-05-31 DIAGNOSIS — Z9229 Personal history of other drug therapy: Secondary | ICD-10-CM | POA: Diagnosis not present

## 2022-05-31 DIAGNOSIS — E785 Hyperlipidemia, unspecified: Secondary | ICD-10-CM | POA: Diagnosis not present

## 2022-05-31 DIAGNOSIS — Z885 Allergy status to narcotic agent status: Secondary | ICD-10-CM | POA: Diagnosis not present

## 2022-05-31 DIAGNOSIS — Z952 Presence of prosthetic heart valve: Secondary | ICD-10-CM | POA: Diagnosis not present

## 2022-05-31 DIAGNOSIS — J90 Pleural effusion, not elsewhere classified: Secondary | ICD-10-CM | POA: Diagnosis not present

## 2022-05-31 DIAGNOSIS — M1990 Unspecified osteoarthritis, unspecified site: Secondary | ICD-10-CM | POA: Diagnosis not present

## 2022-05-31 DIAGNOSIS — I5023 Acute on chronic systolic (congestive) heart failure: Secondary | ICD-10-CM | POA: Diagnosis not present

## 2022-05-31 DIAGNOSIS — R000 Tachycardia, unspecified: Secondary | ICD-10-CM | POA: Diagnosis not present

## 2022-05-31 DIAGNOSIS — E7800 Pure hypercholesterolemia, unspecified: Secondary | ICD-10-CM | POA: Diagnosis not present

## 2022-05-31 DIAGNOSIS — J9601 Acute respiratory failure with hypoxia: Secondary | ICD-10-CM | POA: Diagnosis not present

## 2022-05-31 DIAGNOSIS — I110 Hypertensive heart disease with heart failure: Secondary | ICD-10-CM | POA: Diagnosis not present

## 2022-06-01 DIAGNOSIS — E785 Hyperlipidemia, unspecified: Secondary | ICD-10-CM

## 2022-06-01 DIAGNOSIS — J101 Influenza due to other identified influenza virus with other respiratory manifestations: Secondary | ICD-10-CM

## 2022-06-01 DIAGNOSIS — Z9229 Personal history of other drug therapy: Secondary | ICD-10-CM

## 2022-06-01 DIAGNOSIS — I482 Chronic atrial fibrillation, unspecified: Secondary | ICD-10-CM

## 2022-06-01 DIAGNOSIS — I10 Essential (primary) hypertension: Secondary | ICD-10-CM

## 2022-06-01 DIAGNOSIS — I340 Nonrheumatic mitral (valve) insufficiency: Secondary | ICD-10-CM | POA: Diagnosis not present

## 2022-06-01 DIAGNOSIS — I509 Heart failure, unspecified: Secondary | ICD-10-CM | POA: Diagnosis not present

## 2022-06-01 DIAGNOSIS — R0902 Hypoxemia: Secondary | ICD-10-CM

## 2022-06-02 DIAGNOSIS — E785 Hyperlipidemia, unspecified: Secondary | ICD-10-CM | POA: Diagnosis not present

## 2022-06-02 DIAGNOSIS — I10 Essential (primary) hypertension: Secondary | ICD-10-CM | POA: Diagnosis not present

## 2022-06-02 DIAGNOSIS — I482 Chronic atrial fibrillation, unspecified: Secondary | ICD-10-CM | POA: Diagnosis not present

## 2022-06-02 DIAGNOSIS — I509 Heart failure, unspecified: Secondary | ICD-10-CM | POA: Diagnosis not present

## 2022-06-03 DIAGNOSIS — I482 Chronic atrial fibrillation, unspecified: Secondary | ICD-10-CM | POA: Diagnosis not present

## 2022-06-03 DIAGNOSIS — J101 Influenza due to other identified influenza virus with other respiratory manifestations: Secondary | ICD-10-CM | POA: Diagnosis not present

## 2022-06-03 DIAGNOSIS — I10 Essential (primary) hypertension: Secondary | ICD-10-CM | POA: Diagnosis not present

## 2022-06-03 DIAGNOSIS — Z9229 Personal history of other drug therapy: Secondary | ICD-10-CM | POA: Diagnosis not present

## 2022-06-03 DIAGNOSIS — I429 Cardiomyopathy, unspecified: Secondary | ICD-10-CM

## 2022-06-03 DIAGNOSIS — I2510 Atherosclerotic heart disease of native coronary artery without angina pectoris: Secondary | ICD-10-CM

## 2022-06-13 ENCOUNTER — Other Ambulatory Visit: Payer: Self-pay | Admitting: Interventional Cardiology

## 2022-06-22 DIAGNOSIS — Z0000 Encounter for general adult medical examination without abnormal findings: Secondary | ICD-10-CM | POA: Diagnosis not present

## 2022-06-22 DIAGNOSIS — I5022 Chronic systolic (congestive) heart failure: Secondary | ICD-10-CM | POA: Diagnosis not present

## 2022-06-22 DIAGNOSIS — I4891 Unspecified atrial fibrillation: Secondary | ICD-10-CM | POA: Diagnosis not present

## 2022-06-22 DIAGNOSIS — J1100 Influenza due to unidentified influenza virus with unspecified type of pneumonia: Secondary | ICD-10-CM | POA: Diagnosis not present

## 2022-06-22 DIAGNOSIS — I4892 Unspecified atrial flutter: Secondary | ICD-10-CM | POA: Diagnosis not present

## 2022-06-23 ENCOUNTER — Encounter: Payer: Self-pay | Admitting: Interventional Cardiology

## 2022-06-23 ENCOUNTER — Ambulatory Visit: Payer: Medicare Other | Attending: Interventional Cardiology | Admitting: Interventional Cardiology

## 2022-06-23 VITALS — BP 110/74 | HR 73 | Ht 70.5 in | Wt 188.0 lb

## 2022-06-23 DIAGNOSIS — Z79899 Other long term (current) drug therapy: Secondary | ICD-10-CM | POA: Insufficient documentation

## 2022-06-23 DIAGNOSIS — I2510 Atherosclerotic heart disease of native coronary artery without angina pectoris: Secondary | ICD-10-CM | POA: Insufficient documentation

## 2022-06-23 DIAGNOSIS — I480 Paroxysmal atrial fibrillation: Secondary | ICD-10-CM | POA: Diagnosis not present

## 2022-06-23 NOTE — Progress Notes (Signed)
Cardiology Office Note   Date:  06/23/2022   ID:  Schafer, Studer [birth year], MRN [id number]  PCP:  Street, Sharon Mt, MD    No chief complaint on file.  CAD  Wt Readings from Last 3 Encounters:  06/23/22 188 lb (85.3 kg)  03/21/22 199 lb 6.4 oz (90.4 kg)  03/17/22 201 lb 3.2 oz (91.3 kg)       History of Present Illness: [name] [name] is a 82 y.o. male   with h/o aortic disection, CAD - DES to circumflex in Sept 2011. 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 He was admitted to the hospital on 10/05/13 for urosepsis and was on the ventilator for 2 days. Also has thoracentesis for pleural effusion (1200cc).    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  He had a retinal vein thrombosis apparently, per the patient's report.   In 4/21, he was admitted with heart failure. Echo showed: "Left ventricular ejection fraction, by estimation, is 25 to 30%.  The left ventricle has severely decreased function. The left ventricle  demonstrates global hypokinesis."    Cath showed: "Chronic total occlusion of the mid right coronary, collateralized right to right and left to right. Widely patent left main. Widely patent LAD with moderate luminal irregularities proximal to distal. Widely patent stent in the proximal to mid circumflex with up to 50% narrowing distal to the stent. Moderate pulmonary hypertension.  Mean pulmonary artery pressure 35 mmHg. Mean pulmonary capillary wedge pressure 32 mmHg.   RECOMMENDATIONS:   Left ventricular systolic dysfunction is not on the basis of coronary disease.  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    More recently, history includes: " incarcerated left inguinal hernia s/p repair on 12/02/2019, presents to ED with abd pain. He was found  to have partial colonic obstruction. General surgery consulted. Cardiology consulted for atrial flutter and pre op clearance. Patient Underwent LAP  Repair of the recurrent inguinal hernia by Dr Redmond Pulling on 12/18/19."   IV Amio was used for rate controlled while he was NPO.    He had a seroma that was draining.  This resolved.    Had a pressure sore area  On his heel.  Had home health for 9 months.    Son passed away in 06/28/2023 after a bleeding episode after cath/PCI. He had surgery.  A month later, he fell at home and died there in his recliner. He also had Addisons.  He was in [place].   He did well in 2022.  He was walking regularly.   He has had some increased swelling in the past few months.  He had a fall out of bed as well and banged his leg.  Some blisters are on the leg, clear drainage.     Likes to go shopping.  Walks in stores. No problems with that activity. He now has 6 great grandchildren.    He was hospitalized in 12/23 with flu A.  He was hospitalized in [place].  Given Lasix.    Using medicinal honey for LE sores.  Denies : Chest pain. Dizziness. Leg edema. Nitroglycerin use. Orthopnea. Palpitations. Paroxysmal nocturnal dyspnea. Shortness of breath. Syncope.        Past Medical History:  Diagnosis Date   Acute on chronic diastolic  heart failure (HCC) 08/22/2013   Acute on chronic respiratory failure with hypoxia (HCC) 10/03/2019   Acute on chronic systolic heart failure (HCC) 05/11/2013   EF from 35% to 50-55% on 6/14 ECHO.    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 No evidence of dissection or endoleak.  3.0  cm immediately infrarenal abdominal aortic aneurysm. No evidence of abdominal aortic dissection.  Occluded proximal left subclav   Aortic valve insufficiency    Arthritis    "probably in my knees before they replaced them" (01/28/2015)   Atrial fibrillation (HCC) 05/21/2013   Atrial flutter (HCC)    Bradycardia 08/23/2013   CAD (coronary artery disease)    Chest pain 01/28/2015   CHF (congestive heart failure) (HCC)    Chronic diastolic heart failure (HCC) 01/28/2015   Descending aortic aneurysm (HCC)    DVT (deep venous thrombosis) (HCC)    ?LLE post knee surgery   Dyspnea 09/2019   HCAP (healthcare-associated pneumonia) 10/11/2013   History of blood transfusion    "after one of my knee surgeries"   HTN (hypertension)    Hyperlipidemia    Hypotension 12/17/2019   Incarcerated left inguinal hernia s/p repair 12/02/2019 12/01/2019   Insomnia 09/04/2013   Multiple fractures of ribs, right side, init for clos fx 09/04/2019   Near syncope 01/28/2015   Pressure injury of skin 10/06/2019   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 12/18/2019 12/17/2019   Recurrent UTI 10/06/2013   Shock circulatory (HCC) 10/07/2013   Thoracic aneurysm    Tobacco abuse    Urinary retention 10/06/2013   UTI (urinary tract infection) 10/06/2013    Past Surgical History:  Procedure Laterality Date   AORTIC VALVE REPLACEMENT  02/19/2009   AORTO-FEMORAL BYPASS GRAFT  03/2010   ascending aortic and arch aneurysm repair/notes 04/09/2009   CARDIAC VALVE REPLACEMENT     CAROTID-SUBCLAVIAN BYPASS GRAFT Left 08/26/2013   Procedure: BYPASS GRAFT CAROTID-SUBCLAVIAN;  Surgeon: Nada Libman, MD;  Location: [facility] OR;  Service: Vascular;  Laterality: Left;   CATARACT EXTRACTION W/ INTRAOCULAR LENS  IMPLANT, BILATERAL Bilateral    ENDOVASCULAR STENT INSERTION N/A 08/26/2013   Procedure:  THORACIC STENT GRAFT INSERTION;  Surgeon: Nada Libman, MD;  Location: [facility] OR;  Service: Vascular;  Laterality:  N/A;   EYE SURGERY     INGUINAL HERNIA REPAIR Left 12/02/2019   Procedure: REPAIR LEFT INGUINAL HERNIA WITH MESH;  Surgeon: Violeta Gelinas, MD;  Location: [facility] OR;  Service: General;  Laterality: Left;   INGUINAL HERNIA REPAIR Left 12/18/2019   Procedure: LAPAROSCOPIC ASSISTED REPAIR OF RECURRENT INCARCERATED LEFT INGUINAL HERNIA WITH MESH; LAPAOSCOPIC REPAIR OF SEROSAL TEAR;  Surgeon: Gaynelle Adu, MD;  Location: [facility] OR;  Service: General;  Laterality: Left;   INSERTION OF MESH Left 12/02/2019   Procedure: INSERTION OF MESH;  Surgeon: Violeta Gelinas, MD;  Location: [facility] OR;  Service: General;  Laterality: Left;   JOINT REPLACEMENT     KNEE ARTHROSCOPY Bilateral    REPLACEMENT TOTAL KNEE Bilateral    right groin lymphocele Right 03/29/2009   RIGHT/LEFT HEART CATH AND CORONARY ANGIOGRAPHY N/A 10/06/2019   Procedure: RIGHT/LEFT HEART  CATH AND CORONARY ANGIOGRAPHY;  Surgeon: Belva Crome, MD;  Location: [place] [facility] LAB;  Service: Cardiovascular;  Laterality: N/A;   THORACENTESIS  2010 X 2   TONSILLECTOMY       Current Outpatient Medications  Medication Sig Dispense Refill   acetaminophen (TYLENOL) 500 MG tablet Take 2 tablets (1,000 mg total) by mouth every 6 (six) hours as needed. (Patient taking differently: Take 1,000 mg by mouth every 6 (six) hours as needed for mild pain or moderate pain.) 30 tablet 0   albuterol (PROVENTIL) (2.5 MG/3ML) 0.083% nebulizer solution Take by nebulization.     amiodarone (PACERONE) 200 MG tablet Take 1 tablet (200 mg total) by mouth daily. 30 tablet 0   amoxicillin (AMOXIL) 500 MG capsule Take 4 capsules by mouth 30-60 min prior to dental procedure. (Patient taking differently: Take 1,000 mg by mouth See admin instructions. Take 4 capsules by mouth 30-60 min prior to dental procedure.) 4 capsule 3   apixaban (ELIQUIS) 5 MG TABS tablet Take 1 tablet (5 mg total) by mouth 2 (two) times daily. 180 tablet 1   atorvastatin (LIPITOR) 10 MG tablet Take 1 tablet (10 mg  total) by mouth daily. 90 tablet 3   cefdinir (OMNICEF) 300 MG capsule Take 300 mg by mouth 2 (two) times daily.     ferrous sulfate (SV IRON) 325 (65 FE) MG tablet Take 1 tablet (325 mg total) by mouth daily with breakfast. 90 tablet 3   furosemide (LASIX) 40 MG tablet Take 1 tablet (40 mg total) by mouth 2 (two) times daily as needed. (Patient taking differently: Take 40 mg by mouth 2 (two) times daily as needed for fluid or edema.) 180 tablet 3   lansoprazole (PREVACID) 30 MG capsule Take 30 mg by mouth daily.     levocetirizine (XYZAL) 5 MG tablet Take 5 mg by mouth daily.     losartan (COZAAR) 25 MG tablet Take 1 tablet by mouth once daily (Patient taking differently: Take 25 mg by mouth daily.) 90 tablet 2   metoprolol succinate (TOPROL-XL) 100 MG 24 hr tablet TAKE 1 TABLET BY MOUTH ONCE DAILY WITH OR IMMEDIATELY  FOLLOWING  A  MEAL 90 tablet 0   Multiple Vitamin (MULTIVITAMIN WITH MINERALS) TABS tablet Take 1 tablet by mouth daily.     Multiple Vitamins-Minerals (ZINC PO) Take 1 tablet by mouth daily at 6 (six) AM.     No current facility-administered medications for this visit.    Allergies:   Lortab [hydrocodone-acetaminophen], Morphine and related, and Other    Social History:  The patient  reports that he quit smoking about 48 years ago. His smoking use included cigarettes. He has never used smokeless tobacco. He reports that he does not drink alcohol and does not use drugs.   Family History:  The patient's family history includes Aortic aneurysm in his father; Celiac disease in his daughter; Hypertension in his mother.    ROS:  Please see the history of present illness.   Otherwise, review of systems are positive for legs wrapped.   All other systems are reviewed and negative.    PHYSICAL EXAM: VS:  BP 110/74   Pulse 73   Ht 5' 10.5" (1.791 m)   Wt 188 lb (85.3 kg)   SpO2 98%   BMI 26.59 kg/m  , BMI Body mass index is 26.59 kg/m. GEN: Well nourished, well developed, in  no acute distress HEENT: normal Neck: no JVD, carotid bruits, or masses Cardiac: irregularly  irregular; no murmurs, rubs, or gallops,no edema  Respiratory:  clear to auscultation bilaterally, normal work of breathing GI: soft, nontender, nondistended, + BS MS: no deformity or atrophy Skin: warm and dry, no rash Neuro:  Strength and sensation are intact Psych: euthymic mood, full affect   Recent Labs: 03/17/2022: ALT 25; Hemoglobin 10.3; Platelets 207; TSH 3.500 04/24/2022: BUN 34; Creatinine, Ser 1.51; Potassium 5.0; Sodium 147   Lipid Panel    Component Value Date/Time   CHOL 157 03/17/2022 1235   TRIG 152 (H) 03/17/2022 1235   HDL 45 03/17/2022 1235   CHOLHDL 3.5 03/17/2022 1235   CHOLHDL 3.6 07/29/2010 0210   VLDL 18 07/29/2010 0210   LDLCALC 86 03/17/2022 1235     Other studies Reviewed: Additional studies/ records that were reviewed today with results demonstrating: Labs reviewed, LDL 86.   ASSESSMENT AND PLAN:  CAD: No angina.  Continue aggressive secondary prevention. S/p AVR: Continue SBE prophylaxis. AFib: Rate controlled.  He may have had high rates while he had the flu and was hospitalized.  This seems more stable now.  His breathing is more stable. Chronic systolic/diastolic: mild LE edema.   Anticoagulated: Eliquis for stroke prevention in the setting of atrial fibrillation.    Current medicines are reviewed at length with the patient today.  The patient concerns regarding his medicines were addressed.  The following changes have been made:  No change  Labs/ tests ordered today include: Check c-Met and CBC given that he is on Eliquis No orders of the defined types were placed in this encounter.   Recommend 150 minutes/week of aerobic exercise Low fat, low carb, high fiber diet recommended  Disposition:   FU in June as schedyuled   Signed, Larae Grooms, MD  06/23/2022 4:08 PM    [facility] [address], [place],  [place]  [address] Phone: [phone]; Fax: [phone]

## 2022-06-23 NOTE — Patient Instructions (Signed)
Medication Instructions:  Your physician recommends that you continue on your current medications as directed. Please refer to the Current Medication list given to you today.  *If you need a refill on your cardiac medications before your next appointment, please call your pharmacy*   Lab Work: Lab work to be done today--CMET and CBC If you have labs (blood work) drawn today and your tests are completely normal, you will receive your results only by: [place] (if you have MyChart) OR A paper copy in the mail If you have any lab test that is abnormal or we need to change your treatment, we will call you to review the results.   Testing/Procedures: none   Follow-Up: At [facility], you and your health needs are our priority.  As part of our continuing mission to provide you with exceptional heart care, we have created designated Provider Care Teams.  These Care Teams include your primary Cardiologist (physician) and Advanced Practice Providers (APPs -  Physician Assistants and Nurse Practitioners) who all work together to provide you with the care you need, when you need it.  We recommend signing up for the patient portal called "MyChart".  Sign up information is provided on this After Visit Summary.  MyChart is used to connect with patients for Virtual Visits (Telemedicine).  Patients are able to view lab/test results, encounter notes, upcoming appointments, etc.  Non-urgent messages can be sent to your provider as well.   To learn more about what you can do with MyChart, go to [email or web address].    Your next appointment:   June 17  The format for your next appointment:   In Person  Provider:   Larae Grooms, MD     Other Instructions    Important Information About Sugar

## 2022-06-24 LAB — COMPREHENSIVE METABOLIC PANEL
ALT: 28 IU/L (ref 0–44)
AST: 35 IU/L (ref 0–40)
Albumin/Globulin Ratio: 1.5 (ref 1.2–2.2)
Albumin: 3.9 g/dL (ref 3.7–4.7)
Alkaline Phosphatase: 77 IU/L (ref 44–121)
BUN/Creatinine Ratio: 18 (ref 10–24)
BUN: 21 mg/dL (ref 8–27)
Bilirubin Total: 0.3 mg/dL (ref 0.0–1.2)
CO2: 28 mmol/L (ref 20–29)
Calcium: 9 mg/dL (ref 8.6–10.2)
Chloride: 100 mmol/L (ref 96–106)
Creatinine, Ser: 1.2 mg/dL (ref 0.76–1.27)
Globulin, Total: 2.6 g/dL (ref 1.5–4.5)
Glucose: 103 mg/dL — ABNORMAL HIGH (ref 70–99)
Potassium: 3.7 mmol/L (ref 3.5–5.2)
Sodium: 145 mmol/L — ABNORMAL HIGH (ref 134–144)
Total Protein: 6.5 g/dL (ref 6.0–8.5)
eGFR: 60 mL/min/{1.73_m2} (ref >59)

## 2022-06-24 LAB — CBC
Hematocrit: 30.3 % — ABNORMAL LOW (ref 37.5–51.0)
Hemoglobin: 9.4 g/dL — ABNORMAL LOW (ref 13.0–17.7)
MCH: 30 pg (ref 26.6–33.0)
MCHC: 31 g/dL — ABNORMAL LOW (ref 31.5–35.7)
MCV: 97 fL (ref 79–97)
Platelets: 258 10*3/uL (ref 150–450)
RBC: 3.13 x10E6/uL — ABNORMAL LOW (ref 4.14–5.80)
RDW: 14.7 % (ref 11.6–15.4)
WBC: 5.4 10*3/uL (ref 3.4–10.8)

## 2022-06-26 DIAGNOSIS — H43813 Vitreous degeneration, bilateral: Secondary | ICD-10-CM | POA: Diagnosis not present

## 2022-06-26 DIAGNOSIS — H3582 Retinal ischemia: Secondary | ICD-10-CM | POA: Diagnosis not present

## 2022-06-26 DIAGNOSIS — H35033 Hypertensive retinopathy, bilateral: Secondary | ICD-10-CM | POA: Diagnosis not present

## 2022-06-26 DIAGNOSIS — H34811 Central retinal vein occlusion, right eye, with macular edema: Secondary | ICD-10-CM | POA: Diagnosis not present

## 2022-07-12 ENCOUNTER — Other Ambulatory Visit: Payer: Self-pay | Admitting: Interventional Cardiology

## 2022-07-18 ENCOUNTER — Other Ambulatory Visit: Payer: Self-pay | Admitting: Interventional Cardiology

## 2022-08-02 ENCOUNTER — Other Ambulatory Visit: Payer: Self-pay | Admitting: Cardiology

## 2022-08-02 ENCOUNTER — Other Ambulatory Visit: Payer: Self-pay | Admitting: Interventional Cardiology

## 2022-08-07 ENCOUNTER — Telehealth: Payer: Self-pay | Admitting: Interventional Cardiology

## 2022-08-07 NOTE — Telephone Encounter (Signed)
Pt c/o medication issue:  1. Name of Medication: Postassium  2. How are you currently taking this medication (dosage and times per day)?    3. Are you having a reaction (difficulty breathing--STAT)? no  4. What is your medication issue? Wife called to say that he patient was taking off his potassium last summer. Wife is saying that the patient has no energy, he is always sleepy/sleeping, and over weakness. Wife calling in to see what the options are. Please advise

## 2022-08-07 NOTE — Telephone Encounter (Signed)
I spoke with patient's wife.  She reports for the last week patient has had decreased energy. He gets up in the morning and falls asleep again after eating breakfast.  No chest pain. Some shortness of breath on exertion. Has been losing weight. No appetite.  No increased swelling.  Has chronic abrasions on lower legs.  Last saw PCP in early January after being hospitalized with the flu.  Saw Dr Irish Lack in January.  Patient's wife is concerned potassium may be low.  I let her know on last check potassium was in the normal range.  I asked her to schedule appointment with PCP for evaluation of patient's symptoms and lab check .  She will let us know if PCP feels issues could be heart related.  Sooner appointment with Dr Irish Lack could then be scheduled.

## 2022-08-10 DIAGNOSIS — I25119 Atherosclerotic heart disease of native coronary artery with unspecified angina pectoris: Secondary | ICD-10-CM | POA: Diagnosis not present

## 2022-08-10 DIAGNOSIS — I7121 Aneurysm of the ascending aorta, without rupture: Secondary | ICD-10-CM | POA: Diagnosis not present

## 2022-08-10 DIAGNOSIS — I5022 Chronic systolic (congestive) heart failure: Secondary | ICD-10-CM | POA: Diagnosis not present

## 2022-08-10 DIAGNOSIS — D6489 Other specified anemias: Secondary | ICD-10-CM | POA: Diagnosis not present

## 2022-08-10 DIAGNOSIS — D649 Anemia, unspecified: Secondary | ICD-10-CM | POA: Diagnosis not present

## 2022-08-10 DIAGNOSIS — D509 Iron deficiency anemia, unspecified: Secondary | ICD-10-CM | POA: Diagnosis not present

## 2022-08-14 DIAGNOSIS — W19XXXA Unspecified fall, initial encounter: Secondary | ICD-10-CM | POA: Diagnosis not present

## 2022-08-14 DIAGNOSIS — I10 Essential (primary) hypertension: Secondary | ICD-10-CM | POA: Diagnosis not present

## 2022-08-14 DIAGNOSIS — K219 Gastro-esophageal reflux disease without esophagitis: Secondary | ICD-10-CM | POA: Diagnosis not present

## 2022-08-14 DIAGNOSIS — Z043 Encounter for examination and observation following other accident: Secondary | ICD-10-CM | POA: Diagnosis not present

## 2022-08-14 DIAGNOSIS — M79603 Pain in arm, unspecified: Secondary | ICD-10-CM | POA: Diagnosis not present

## 2022-08-14 DIAGNOSIS — S43402A Unspecified sprain of left shoulder joint, initial encounter: Secondary | ICD-10-CM | POA: Diagnosis not present

## 2022-08-15 ENCOUNTER — Telehealth: Payer: Self-pay

## 2022-08-15 DIAGNOSIS — K219 Gastro-esophageal reflux disease without esophagitis: Secondary | ICD-10-CM | POA: Diagnosis not present

## 2022-08-15 DIAGNOSIS — S43402A Unspecified sprain of left shoulder joint, initial encounter: Secondary | ICD-10-CM | POA: Diagnosis not present

## 2022-08-15 DIAGNOSIS — W19XXXA Unspecified fall, initial encounter: Secondary | ICD-10-CM | POA: Diagnosis not present

## 2022-08-15 DIAGNOSIS — I10 Essential (primary) hypertension: Secondary | ICD-10-CM | POA: Diagnosis not present

## 2022-08-15 NOTE — Patient Outreach (Signed)
  Care Coordination   Initial Visit Note   08/15/2022 Name: [name] [name] MRN: [id number] DOB: [birth year]  [name] [name] is a 82 y.o. year old male who sees Street, Sharon Mt, MD for primary care. I spoke with  [name] [name] by phone today.Placed call to patient and he requested I speak with wife [name] [name].  What matters to the patients health and wellness today?  Placed call to patient and wife to review the reason for call. Wife reports patient has had decrease energy.  Patient had follow up with cardiology and PCP. Medications adjusted. Patient fell yesterday and went to the ED.   Wife reports she was previously in health care and is managing well.  Denies needs today.   SDOH assessments and interventions completed:  No     Care Coordination Interventions:  No, not indicated   Follow up plan: No further intervention required.   Encounter Outcome:  Pt. Refused   Tomasa Rand, RN, BSN, CEN [facility] [employer] [phone]

## 2022-08-21 DIAGNOSIS — M75102 Unspecified rotator cuff tear or rupture of left shoulder, not specified as traumatic: Secondary | ICD-10-CM | POA: Diagnosis not present

## 2022-08-21 DIAGNOSIS — M25512 Pain in left shoulder: Secondary | ICD-10-CM | POA: Diagnosis not present

## 2022-09-05 ENCOUNTER — Other Ambulatory Visit: Payer: Self-pay | Admitting: Interventional Cardiology

## 2022-09-07 ENCOUNTER — Other Ambulatory Visit: Payer: Self-pay | Admitting: Interventional Cardiology

## 2022-09-08 ENCOUNTER — Other Ambulatory Visit: Payer: Self-pay | Admitting: Interventional Cardiology

## 2022-09-18 DIAGNOSIS — I4892 Unspecified atrial flutter: Secondary | ICD-10-CM | POA: Diagnosis not present

## 2022-09-18 DIAGNOSIS — D6869 Other thrombophilia: Secondary | ICD-10-CM | POA: Diagnosis not present

## 2022-09-18 DIAGNOSIS — M75102 Unspecified rotator cuff tear or rupture of left shoulder, not specified as traumatic: Secondary | ICD-10-CM | POA: Diagnosis not present

## 2022-09-18 DIAGNOSIS — D6489 Other specified anemias: Secondary | ICD-10-CM | POA: Diagnosis not present

## 2022-09-18 DIAGNOSIS — I5022 Chronic systolic (congestive) heart failure: Secondary | ICD-10-CM | POA: Diagnosis not present

## 2022-09-18 DIAGNOSIS — I4891 Unspecified atrial fibrillation: Secondary | ICD-10-CM | POA: Diagnosis not present

## 2022-09-18 DIAGNOSIS — Z79899 Other long term (current) drug therapy: Secondary | ICD-10-CM | POA: Diagnosis not present

## 2022-09-18 DIAGNOSIS — D500 Iron deficiency anemia secondary to blood loss (chronic): Secondary | ICD-10-CM | POA: Diagnosis not present

## 2022-09-18 DIAGNOSIS — D649 Anemia, unspecified: Secondary | ICD-10-CM | POA: Diagnosis not present

## 2022-09-18 DIAGNOSIS — M25512 Pain in left shoulder: Secondary | ICD-10-CM | POA: Diagnosis not present

## 2022-09-20 DIAGNOSIS — N189 Chronic kidney disease, unspecified: Secondary | ICD-10-CM | POA: Diagnosis not present

## 2022-09-20 DIAGNOSIS — J439 Emphysema, unspecified: Secondary | ICD-10-CM | POA: Diagnosis not present

## 2022-09-20 DIAGNOSIS — R911 Solitary pulmonary nodule: Secondary | ICD-10-CM | POA: Diagnosis not present

## 2022-09-20 DIAGNOSIS — N179 Acute kidney failure, unspecified: Secondary | ICD-10-CM | POA: Diagnosis not present

## 2022-09-20 DIAGNOSIS — K5730 Diverticulosis of large intestine without perforation or abscess without bleeding: Secondary | ICD-10-CM | POA: Diagnosis not present

## 2022-09-20 DIAGNOSIS — D72825 Bandemia: Secondary | ICD-10-CM | POA: Diagnosis not present

## 2022-10-30 DIAGNOSIS — S91302A Unspecified open wound, left foot, initial encounter: Secondary | ICD-10-CM | POA: Diagnosis not present

## 2022-10-30 DIAGNOSIS — R059 Cough, unspecified: Secondary | ICD-10-CM | POA: Diagnosis not present

## 2022-10-30 DIAGNOSIS — R509 Fever, unspecified: Secondary | ICD-10-CM | POA: Diagnosis not present

## 2022-10-30 DIAGNOSIS — R069 Unspecified abnormalities of breathing: Secondary | ICD-10-CM | POA: Diagnosis not present

## 2022-10-30 DIAGNOSIS — S91301A Unspecified open wound, right foot, initial encounter: Secondary | ICD-10-CM | POA: Diagnosis not present

## 2022-10-30 DIAGNOSIS — I509 Heart failure, unspecified: Secondary | ICD-10-CM | POA: Diagnosis not present

## 2022-10-30 DIAGNOSIS — R0902 Hypoxemia: Secondary | ICD-10-CM | POA: Diagnosis not present

## 2022-10-30 DIAGNOSIS — R7881 Bacteremia: Secondary | ICD-10-CM | POA: Diagnosis not present

## 2022-10-30 DIAGNOSIS — I83019 Varicose veins of right lower extremity with ulcer of unspecified site: Secondary | ICD-10-CM | POA: Diagnosis not present

## 2022-10-30 DIAGNOSIS — I10 Essential (primary) hypertension: Secondary | ICD-10-CM | POA: Diagnosis not present

## 2022-10-30 DIAGNOSIS — J189 Pneumonia, unspecified organism: Secondary | ICD-10-CM | POA: Diagnosis not present

## 2022-10-31 DIAGNOSIS — S91302A Unspecified open wound, left foot, initial encounter: Secondary | ICD-10-CM | POA: Diagnosis not present

## 2022-10-31 DIAGNOSIS — Z452 Encounter for adjustment and management of vascular access device: Secondary | ICD-10-CM | POA: Diagnosis not present

## 2022-10-31 DIAGNOSIS — N400 Enlarged prostate without lower urinary tract symptoms: Secondary | ICD-10-CM | POA: Diagnosis not present

## 2022-10-31 DIAGNOSIS — S81809A Unspecified open wound, unspecified lower leg, initial encounter: Secondary | ICD-10-CM | POA: Diagnosis not present

## 2022-10-31 DIAGNOSIS — S91109A Unspecified open wound of unspecified toe(s) without damage to nail, initial encounter: Secondary | ICD-10-CM | POA: Diagnosis not present

## 2022-10-31 DIAGNOSIS — R0602 Shortness of breath: Secondary | ICD-10-CM | POA: Diagnosis not present

## 2022-10-31 DIAGNOSIS — M1990 Unspecified osteoarthritis, unspecified site: Secondary | ICD-10-CM | POA: Diagnosis not present

## 2022-10-31 DIAGNOSIS — E7800 Pure hypercholesterolemia, unspecified: Secondary | ICD-10-CM | POA: Diagnosis not present

## 2022-10-31 DIAGNOSIS — I10 Essential (primary) hypertension: Secondary | ICD-10-CM | POA: Diagnosis not present

## 2022-10-31 DIAGNOSIS — I83019 Varicose veins of right lower extremity with ulcer of unspecified site: Secondary | ICD-10-CM | POA: Diagnosis not present

## 2022-10-31 DIAGNOSIS — R509 Fever, unspecified: Secondary | ICD-10-CM | POA: Diagnosis not present

## 2022-10-31 DIAGNOSIS — J441 Chronic obstructive pulmonary disease with (acute) exacerbation: Secondary | ICD-10-CM | POA: Diagnosis not present

## 2022-10-31 DIAGNOSIS — I482 Chronic atrial fibrillation, unspecified: Secondary | ICD-10-CM | POA: Diagnosis not present

## 2022-10-31 DIAGNOSIS — R059 Cough, unspecified: Secondary | ICD-10-CM | POA: Diagnosis not present

## 2022-10-31 DIAGNOSIS — I509 Heart failure, unspecified: Secondary | ICD-10-CM | POA: Diagnosis not present

## 2022-10-31 DIAGNOSIS — Z9861 Coronary angioplasty status: Secondary | ICD-10-CM | POA: Diagnosis not present

## 2022-10-31 DIAGNOSIS — I5023 Acute on chronic systolic (congestive) heart failure: Secondary | ICD-10-CM | POA: Diagnosis not present

## 2022-10-31 DIAGNOSIS — I83029 Varicose veins of left lower extremity with ulcer of unspecified site: Secondary | ICD-10-CM | POA: Diagnosis not present

## 2022-10-31 DIAGNOSIS — B9562 Methicillin resistant Staphylococcus aureus infection as the cause of diseases classified elsewhere: Secondary | ICD-10-CM | POA: Diagnosis not present

## 2022-10-31 DIAGNOSIS — Z7952 Long term (current) use of systemic steroids: Secondary | ICD-10-CM | POA: Diagnosis not present

## 2022-10-31 DIAGNOSIS — S91104A Unspecified open wound of right lesser toe(s) without damage to nail, initial encounter: Secondary | ICD-10-CM | POA: Diagnosis not present

## 2022-10-31 DIAGNOSIS — S92502A Displaced unspecified fracture of left lesser toe(s), initial encounter for closed fracture: Secondary | ICD-10-CM | POA: Diagnosis not present

## 2022-10-31 DIAGNOSIS — J440 Chronic obstructive pulmonary disease with acute lower respiratory infection: Secondary | ICD-10-CM | POA: Diagnosis not present

## 2022-10-31 DIAGNOSIS — L97919 Non-pressure chronic ulcer of unspecified part of right lower leg with unspecified severity: Secondary | ICD-10-CM | POA: Diagnosis not present

## 2022-10-31 DIAGNOSIS — R7881 Bacteremia: Secondary | ICD-10-CM | POA: Diagnosis not present

## 2022-10-31 DIAGNOSIS — I252 Old myocardial infarction: Secondary | ICD-10-CM | POA: Diagnosis not present

## 2022-10-31 DIAGNOSIS — I340 Nonrheumatic mitral (valve) insufficiency: Secondary | ICD-10-CM | POA: Diagnosis not present

## 2022-10-31 DIAGNOSIS — L97929 Non-pressure chronic ulcer of unspecified part of left lower leg with unspecified severity: Secondary | ICD-10-CM | POA: Diagnosis not present

## 2022-10-31 DIAGNOSIS — M24874 Other specific joint derangements of right foot, not elsewhere classified: Secondary | ICD-10-CM | POA: Diagnosis not present

## 2022-10-31 DIAGNOSIS — I110 Hypertensive heart disease with heart failure: Secondary | ICD-10-CM | POA: Diagnosis not present

## 2022-10-31 DIAGNOSIS — K219 Gastro-esophageal reflux disease without esophagitis: Secondary | ICD-10-CM | POA: Diagnosis not present

## 2022-10-31 DIAGNOSIS — I361 Nonrheumatic tricuspid (valve) insufficiency: Secondary | ICD-10-CM | POA: Diagnosis not present

## 2022-10-31 DIAGNOSIS — S91105A Unspecified open wound of left lesser toe(s) without damage to nail, initial encounter: Secondary | ICD-10-CM | POA: Diagnosis not present

## 2022-10-31 DIAGNOSIS — S91301A Unspecified open wound, right foot, initial encounter: Secondary | ICD-10-CM | POA: Diagnosis not present

## 2022-10-31 DIAGNOSIS — J189 Pneumonia, unspecified organism: Secondary | ICD-10-CM | POA: Diagnosis present

## 2022-10-31 DIAGNOSIS — Z885 Allergy status to narcotic agent status: Secondary | ICD-10-CM | POA: Diagnosis not present

## 2022-10-31 DIAGNOSIS — N170 Acute kidney failure with tubular necrosis: Secondary | ICD-10-CM | POA: Diagnosis not present

## 2022-10-31 DIAGNOSIS — J9601 Acute respiratory failure with hypoxia: Secondary | ICD-10-CM | POA: Diagnosis not present

## 2022-11-02 DIAGNOSIS — I340 Nonrheumatic mitral (valve) insufficiency: Secondary | ICD-10-CM

## 2022-11-02 DIAGNOSIS — I361 Nonrheumatic tricuspid (valve) insufficiency: Secondary | ICD-10-CM

## 2022-11-09 DIAGNOSIS — I5023 Acute on chronic systolic (congestive) heart failure: Secondary | ICD-10-CM | POA: Diagnosis not present

## 2022-11-09 DIAGNOSIS — L97819 Non-pressure chronic ulcer of other part of right lower leg with unspecified severity: Secondary | ICD-10-CM | POA: Diagnosis not present

## 2022-11-09 DIAGNOSIS — I83028 Varicose veins of left lower extremity with ulcer other part of lower leg: Secondary | ICD-10-CM | POA: Diagnosis not present

## 2022-11-09 DIAGNOSIS — B9562 Methicillin resistant Staphylococcus aureus infection as the cause of diseases classified elsewhere: Secondary | ICD-10-CM | POA: Diagnosis not present

## 2022-11-09 DIAGNOSIS — R7881 Bacteremia: Secondary | ICD-10-CM | POA: Diagnosis not present

## 2022-11-09 DIAGNOSIS — I482 Chronic atrial fibrillation, unspecified: Secondary | ICD-10-CM | POA: Diagnosis not present

## 2022-11-09 DIAGNOSIS — S92592D Other fracture of left lesser toe(s), subsequent encounter for fracture with routine healing: Secondary | ICD-10-CM | POA: Diagnosis not present

## 2022-11-09 DIAGNOSIS — I4892 Unspecified atrial flutter: Secondary | ICD-10-CM | POA: Diagnosis not present

## 2022-11-09 DIAGNOSIS — N179 Acute kidney failure, unspecified: Secondary | ICD-10-CM | POA: Diagnosis not present

## 2022-11-09 DIAGNOSIS — D6869 Other thrombophilia: Secondary | ICD-10-CM | POA: Diagnosis not present

## 2022-11-09 DIAGNOSIS — S91104D Unspecified open wound of right lesser toe(s) without damage to nail, subsequent encounter: Secondary | ICD-10-CM | POA: Diagnosis not present

## 2022-11-09 DIAGNOSIS — L97829 Non-pressure chronic ulcer of other part of left lower leg with unspecified severity: Secondary | ICD-10-CM | POA: Diagnosis not present

## 2022-11-09 DIAGNOSIS — I252 Old myocardial infarction: Secondary | ICD-10-CM | POA: Diagnosis not present

## 2022-11-09 DIAGNOSIS — J440 Chronic obstructive pulmonary disease with acute lower respiratory infection: Secondary | ICD-10-CM | POA: Diagnosis not present

## 2022-11-09 DIAGNOSIS — I110 Hypertensive heart disease with heart failure: Secondary | ICD-10-CM | POA: Diagnosis not present

## 2022-11-09 DIAGNOSIS — J9601 Acute respiratory failure with hypoxia: Secondary | ICD-10-CM | POA: Diagnosis not present

## 2022-11-09 DIAGNOSIS — M19071 Primary osteoarthritis, right ankle and foot: Secondary | ICD-10-CM | POA: Diagnosis not present

## 2022-11-09 DIAGNOSIS — I83018 Varicose veins of right lower extremity with ulcer other part of lower leg: Secondary | ICD-10-CM | POA: Diagnosis not present

## 2022-11-09 DIAGNOSIS — I272 Pulmonary hypertension, unspecified: Secondary | ICD-10-CM | POA: Diagnosis not present

## 2022-11-09 DIAGNOSIS — J441 Chronic obstructive pulmonary disease with (acute) exacerbation: Secondary | ICD-10-CM | POA: Diagnosis not present

## 2022-11-09 DIAGNOSIS — J188 Other pneumonia, unspecified organism: Secondary | ICD-10-CM | POA: Diagnosis not present

## 2022-11-09 DIAGNOSIS — I2510 Atherosclerotic heart disease of native coronary artery without angina pectoris: Secondary | ICD-10-CM | POA: Diagnosis not present

## 2022-11-09 DIAGNOSIS — N400 Enlarged prostate without lower urinary tract symptoms: Secondary | ICD-10-CM | POA: Diagnosis not present

## 2022-11-09 DIAGNOSIS — I081 Rheumatic disorders of both mitral and tricuspid valves: Secondary | ICD-10-CM | POA: Diagnosis not present

## 2022-11-09 DIAGNOSIS — S91105D Unspecified open wound of left lesser toe(s) without damage to nail, subsequent encounter: Secondary | ICD-10-CM | POA: Diagnosis not present

## 2022-11-14 ENCOUNTER — Telehealth: Payer: Self-pay | Admitting: Interventional Cardiology

## 2022-11-14 DIAGNOSIS — J440 Chronic obstructive pulmonary disease with acute lower respiratory infection: Secondary | ICD-10-CM | POA: Diagnosis not present

## 2022-11-14 DIAGNOSIS — J188 Other pneumonia, unspecified organism: Secondary | ICD-10-CM | POA: Diagnosis not present

## 2022-11-14 DIAGNOSIS — L97819 Non-pressure chronic ulcer of other part of right lower leg with unspecified severity: Secondary | ICD-10-CM | POA: Diagnosis not present

## 2022-11-14 DIAGNOSIS — J441 Chronic obstructive pulmonary disease with (acute) exacerbation: Secondary | ICD-10-CM | POA: Diagnosis not present

## 2022-11-14 DIAGNOSIS — I83028 Varicose veins of left lower extremity with ulcer other part of lower leg: Secondary | ICD-10-CM | POA: Diagnosis not present

## 2022-11-14 DIAGNOSIS — B9562 Methicillin resistant Staphylococcus aureus infection as the cause of diseases classified elsewhere: Secondary | ICD-10-CM | POA: Diagnosis not present

## 2022-11-14 DIAGNOSIS — I83018 Varicose veins of right lower extremity with ulcer other part of lower leg: Secondary | ICD-10-CM | POA: Diagnosis not present

## 2022-11-14 NOTE — Telephone Encounter (Signed)
Pt c/o medication issue:  1. Name of Medication:   apixaban (ELIQUIS) 5 MG TABS tablet    2. How are you currently taking this medication (dosage and times per day)?   Take 1 tablet (5 mg total) by mouth 2 (two) times daily.    3. Are you having a reaction (difficulty breathing--STAT)? No  4. What is your medication issue? Pt spouse would like a callback to discuss him being discharged from [place] on Tuesday night and they decreased his dose of this above medication. Please advise

## 2022-11-14 NOTE — Telephone Encounter (Signed)
Spoke to the patient wife [name], [place], stated pt was admitted to [facility] from 10/30/22-11/07/22 for MRSA and pneumonia. He is currently asymptomatic. While he was admitted his Eliquis was decreased to 2.5 mg BID, pt wife wanted [name] advise. Ms. [name] also mentioned  PCP called stating  the pt hemoglobin is decreasing and pt should go to [facility]. Pt has appointment with PCP on 5/31 and asked if the patient can have his blood work repeated during the visit, according to pt wife , MD agreed. Explained ED precautions, Ms. [name] voiced understanding.

## 2022-11-15 MED ORDER — APIXABAN 2.5 MG PO TABS: 2.5000 mg | ORAL_TABLET | Freq: Two times a day (BID) | ORAL | 0 refills | Status: DC

## 2022-11-15 NOTE — Telephone Encounter (Signed)
OK to reduce Eliquis to 2.5 mg BID.  It is based on age, weight and kidney function.  Likely that the change was recommended based on his most recent renal function.

## 2022-11-15 NOTE — Telephone Encounter (Signed)
Patient's wife notified. Med list updated

## 2022-11-17 DIAGNOSIS — J441 Chronic obstructive pulmonary disease with (acute) exacerbation: Secondary | ICD-10-CM | POA: Diagnosis not present

## 2022-11-17 DIAGNOSIS — J440 Chronic obstructive pulmonary disease with acute lower respiratory infection: Secondary | ICD-10-CM | POA: Diagnosis not present

## 2022-11-17 DIAGNOSIS — I83018 Varicose veins of right lower extremity with ulcer other part of lower leg: Secondary | ICD-10-CM | POA: Diagnosis not present

## 2022-11-17 DIAGNOSIS — I83028 Varicose veins of left lower extremity with ulcer other part of lower leg: Secondary | ICD-10-CM | POA: Diagnosis not present

## 2022-11-17 DIAGNOSIS — L97819 Non-pressure chronic ulcer of other part of right lower leg with unspecified severity: Secondary | ICD-10-CM | POA: Diagnosis not present

## 2022-11-17 DIAGNOSIS — J188 Other pneumonia, unspecified organism: Secondary | ICD-10-CM | POA: Diagnosis not present

## 2022-11-17 DIAGNOSIS — D6489 Other specified anemias: Secondary | ICD-10-CM | POA: Diagnosis not present

## 2022-11-20 DIAGNOSIS — L89613 Pressure ulcer of right heel, stage 3: Secondary | ICD-10-CM | POA: Diagnosis not present

## 2022-11-20 DIAGNOSIS — I83019 Varicose veins of right lower extremity with ulcer of unspecified site: Secondary | ICD-10-CM | POA: Diagnosis not present

## 2022-11-20 DIAGNOSIS — M79671 Pain in right foot: Secondary | ICD-10-CM | POA: Diagnosis not present

## 2022-11-20 DIAGNOSIS — J15212 Pneumonia due to Methicillin resistant Staphylococcus aureus: Secondary | ICD-10-CM | POA: Diagnosis not present

## 2022-11-20 DIAGNOSIS — M868X7 Other osteomyelitis, ankle and foot: Secondary | ICD-10-CM | POA: Diagnosis not present

## 2022-11-20 DIAGNOSIS — R600 Localized edema: Secondary | ICD-10-CM | POA: Diagnosis not present

## 2022-11-20 DIAGNOSIS — L97529 Non-pressure chronic ulcer of other part of left foot with unspecified severity: Secondary | ICD-10-CM | POA: Diagnosis not present

## 2022-11-20 DIAGNOSIS — L97919 Non-pressure chronic ulcer of unspecified part of right lower leg with unspecified severity: Secondary | ICD-10-CM | POA: Diagnosis not present

## 2022-11-20 DIAGNOSIS — L97528 Non-pressure chronic ulcer of other part of left foot with other specified severity: Secondary | ICD-10-CM | POA: Diagnosis not present

## 2022-11-20 DIAGNOSIS — L97519 Non-pressure chronic ulcer of other part of right foot with unspecified severity: Secondary | ICD-10-CM | POA: Diagnosis not present

## 2022-11-20 DIAGNOSIS — I83029 Varicose veins of left lower extremity with ulcer of unspecified site: Secondary | ICD-10-CM | POA: Diagnosis not present

## 2022-11-21 ENCOUNTER — Encounter: Payer: Self-pay | Admitting: Internal Medicine

## 2022-11-21 DIAGNOSIS — I110 Hypertensive heart disease with heart failure: Secondary | ICD-10-CM | POA: Diagnosis not present

## 2022-11-21 DIAGNOSIS — J441 Chronic obstructive pulmonary disease with (acute) exacerbation: Secondary | ICD-10-CM | POA: Diagnosis not present

## 2022-11-21 DIAGNOSIS — J188 Other pneumonia, unspecified organism: Secondary | ICD-10-CM | POA: Diagnosis not present

## 2022-11-21 DIAGNOSIS — R7881 Bacteremia: Secondary | ICD-10-CM | POA: Diagnosis not present

## 2022-11-21 DIAGNOSIS — I83028 Varicose veins of left lower extremity with ulcer other part of lower leg: Secondary | ICD-10-CM | POA: Diagnosis not present

## 2022-11-21 DIAGNOSIS — L97819 Non-pressure chronic ulcer of other part of right lower leg with unspecified severity: Secondary | ICD-10-CM | POA: Diagnosis not present

## 2022-11-21 DIAGNOSIS — I83018 Varicose veins of right lower extremity with ulcer other part of lower leg: Secondary | ICD-10-CM | POA: Diagnosis not present

## 2022-11-21 DIAGNOSIS — J440 Chronic obstructive pulmonary disease with acute lower respiratory infection: Secondary | ICD-10-CM | POA: Diagnosis not present

## 2022-11-21 DIAGNOSIS — B9562 Methicillin resistant Staphylococcus aureus infection as the cause of diseases classified elsewhere: Secondary | ICD-10-CM | POA: Diagnosis not present

## 2022-11-23 DIAGNOSIS — L97819 Non-pressure chronic ulcer of other part of right lower leg with unspecified severity: Secondary | ICD-10-CM | POA: Diagnosis not present

## 2022-11-23 DIAGNOSIS — J441 Chronic obstructive pulmonary disease with (acute) exacerbation: Secondary | ICD-10-CM | POA: Diagnosis not present

## 2022-11-23 DIAGNOSIS — J188 Other pneumonia, unspecified organism: Secondary | ICD-10-CM | POA: Diagnosis not present

## 2022-11-23 DIAGNOSIS — I83018 Varicose veins of right lower extremity with ulcer other part of lower leg: Secondary | ICD-10-CM | POA: Diagnosis not present

## 2022-11-23 DIAGNOSIS — I83028 Varicose veins of left lower extremity with ulcer other part of lower leg: Secondary | ICD-10-CM | POA: Diagnosis not present

## 2022-11-23 DIAGNOSIS — J440 Chronic obstructive pulmonary disease with acute lower respiratory infection: Secondary | ICD-10-CM | POA: Diagnosis not present

## 2022-11-27 ENCOUNTER — Other Ambulatory Visit: Payer: Self-pay | Admitting: Internal Medicine

## 2022-11-27 ENCOUNTER — Encounter (HOSPITAL_COMMUNITY): Payer: Self-pay

## 2022-11-27 DIAGNOSIS — I2510 Atherosclerotic heart disease of native coronary artery without angina pectoris: Secondary | ICD-10-CM | POA: Diagnosis not present

## 2022-11-27 DIAGNOSIS — I509 Heart failure, unspecified: Secondary | ICD-10-CM | POA: Diagnosis not present

## 2022-11-27 DIAGNOSIS — M86379 Chronic multifocal osteomyelitis, unspecified ankle and foot: Secondary | ICD-10-CM | POA: Diagnosis not present

## 2022-11-27 DIAGNOSIS — J918 Pleural effusion in other conditions classified elsewhere: Secondary | ICD-10-CM | POA: Diagnosis not present

## 2022-11-27 DIAGNOSIS — I4891 Unspecified atrial fibrillation: Secondary | ICD-10-CM | POA: Diagnosis not present

## 2022-11-27 DIAGNOSIS — R0602 Shortness of breath: Secondary | ICD-10-CM | POA: Diagnosis not present

## 2022-11-27 DIAGNOSIS — J189 Pneumonia, unspecified organism: Secondary | ICD-10-CM | POA: Diagnosis not present

## 2022-11-27 DIAGNOSIS — I482 Chronic atrial fibrillation, unspecified: Secondary | ICD-10-CM | POA: Diagnosis not present

## 2022-11-27 DIAGNOSIS — R9431 Abnormal electrocardiogram [ECG] [EKG]: Secondary | ICD-10-CM | POA: Diagnosis not present

## 2022-11-27 DIAGNOSIS — I4519 Other right bundle-branch block: Secondary | ICD-10-CM | POA: Diagnosis not present

## 2022-11-27 DIAGNOSIS — J811 Chronic pulmonary edema: Secondary | ICD-10-CM | POA: Diagnosis not present

## 2022-11-27 DIAGNOSIS — J90 Pleural effusion, not elsewhere classified: Secondary | ICD-10-CM | POA: Diagnosis not present

## 2022-11-27 NOTE — Progress Notes (Unsigned)
Plan of Care Note for accepted transfer   Patient: [name] [name] MRN: [id number]   DOA: (Not on file)  Facility requesting transfer: [facility] hospital Requesting Provider: [facility] Reason for transfer: Multifocal pneumonia with tiny cavitations requiring Pulmonology and ID consults Facility course: 82 year old with recent admission for MRSA pneumonia and osteomyelitis.  Patient apparently was discharged home on IV daptomycin and doxycycline.  Patient returns to the ED with acute shortness of breath over the past few days.  Sudden onset.  Repeat CT without contrast shows new areas of tiny cavitations in the right upper lobe with consolidation.  There were also findings of moderate loculated left-sided pleural effusion.  Due to complexity of patient's pneumonia and loculated fluid collection.  Patient was referred for transfer to from [facility] to [facility] for further management by pulmonology and infectious disease.  Patient had been on Eliquis but was staooped a few days prior in anticipation for amputation procedure.  Will consider CTA to rule out PE.  EMR reviewed with CT scan findings.  Plan of care: The patient is accepted for admission to [facility] unit, at [facility]    Author: Lilia Pro, MD 11/27/2022  Check [email or web address] for on-call coverage.  Nursing staff, Please call [facility] Admits & Consults System-Wide number on Amion as soon as patient's arrival, so appropriate admitting provider can evaluate the pt.

## 2022-11-28 ENCOUNTER — Observation Stay (HOSPITAL_COMMUNITY): Payer: Medicare Other

## 2022-11-28 ENCOUNTER — Telehealth: Payer: Self-pay | Admitting: Interventional Cardiology

## 2022-11-28 ENCOUNTER — Inpatient Hospital Stay (HOSPITAL_COMMUNITY)
Admit: 2022-11-28 | Discharge: 2022-12-08 | DRG: 871 | Disposition: A | Discharge status: Rehabilitation Facility | Payer: Medicare Other | Source: Other Acute Inpatient Hospital | Attending: Internal Medicine | Admitting: Internal Medicine

## 2022-11-28 DIAGNOSIS — L89613 Pressure ulcer of right heel, stage 3: Secondary | ICD-10-CM | POA: Diagnosis present

## 2022-11-28 DIAGNOSIS — R7401 Elevation of levels of liver transaminase levels: Secondary | ICD-10-CM | POA: Diagnosis not present

## 2022-11-28 DIAGNOSIS — N179 Acute kidney failure, unspecified: Secondary | ICD-10-CM

## 2022-11-28 DIAGNOSIS — D649 Anemia, unspecified: Secondary | ICD-10-CM | POA: Diagnosis present

## 2022-11-28 DIAGNOSIS — Z885 Allergy status to narcotic agent status: Secondary | ICD-10-CM

## 2022-11-28 DIAGNOSIS — R579 Shock, unspecified: Secondary | ICD-10-CM | POA: Diagnosis not present

## 2022-11-28 DIAGNOSIS — A4102 Sepsis due to Methicillin resistant Staphylococcus aureus: Secondary | ICD-10-CM | POA: Diagnosis not present

## 2022-11-28 DIAGNOSIS — K761 Chronic passive congestion of liver: Secondary | ICD-10-CM | POA: Diagnosis present

## 2022-11-28 DIAGNOSIS — L97424 Non-pressure chronic ulcer of left heel and midfoot with necrosis of bone: Secondary | ICD-10-CM | POA: Diagnosis present

## 2022-11-28 DIAGNOSIS — I081 Rheumatic disorders of both mitral and tricuspid valves: Secondary | ICD-10-CM | POA: Diagnosis present

## 2022-11-28 DIAGNOSIS — Z86718 Personal history of other venous thrombosis and embolism: Secondary | ICD-10-CM

## 2022-11-28 DIAGNOSIS — J189 Pneumonia, unspecified organism: Secondary | ICD-10-CM

## 2022-11-28 DIAGNOSIS — I2510 Atherosclerotic heart disease of native coronary artery without angina pectoris: Secondary | ICD-10-CM | POA: Diagnosis present

## 2022-11-28 DIAGNOSIS — J15212 Pneumonia due to Methicillin resistant Staphylococcus aureus: Secondary | ICD-10-CM | POA: Diagnosis not present

## 2022-11-28 DIAGNOSIS — N1832 Chronic kidney disease, stage 3b: Secondary | ICD-10-CM | POA: Diagnosis present

## 2022-11-28 DIAGNOSIS — R0602 Shortness of breath: Secondary | ICD-10-CM | POA: Diagnosis not present

## 2022-11-28 DIAGNOSIS — Z87891 Personal history of nicotine dependence: Secondary | ICD-10-CM | POA: Diagnosis not present

## 2022-11-28 DIAGNOSIS — I5022 Chronic systolic (congestive) heart failure: Secondary | ICD-10-CM

## 2022-11-28 DIAGNOSIS — S91102A Unspecified open wound of left great toe without damage to nail, initial encounter: Secondary | ICD-10-CM | POA: Diagnosis not present

## 2022-11-28 DIAGNOSIS — Z953 Presence of xenogenic heart valve: Secondary | ICD-10-CM

## 2022-11-28 DIAGNOSIS — L97514 Non-pressure chronic ulcer of other part of right foot with necrosis of bone: Secondary | ICD-10-CM

## 2022-11-28 DIAGNOSIS — R7881 Bacteremia: Secondary | ICD-10-CM

## 2022-11-28 DIAGNOSIS — I739 Peripheral vascular disease, unspecified: Secondary | ICD-10-CM | POA: Diagnosis present

## 2022-11-28 DIAGNOSIS — E785 Hyperlipidemia, unspecified: Secondary | ICD-10-CM | POA: Diagnosis present

## 2022-11-28 DIAGNOSIS — Z1321 Encounter for screening for nutritional disorder: Secondary | ICD-10-CM | POA: Diagnosis not present

## 2022-11-28 DIAGNOSIS — J948 Other specified pleural conditions: Secondary | ICD-10-CM | POA: Diagnosis not present

## 2022-11-28 DIAGNOSIS — L89156 Pressure-induced deep tissue damage of sacral region: Secondary | ICD-10-CM | POA: Diagnosis present

## 2022-11-28 DIAGNOSIS — I70229 Atherosclerosis of native arteries of extremities with rest pain, unspecified extremity: Secondary | ICD-10-CM | POA: Diagnosis not present

## 2022-11-28 DIAGNOSIS — I959 Hypotension, unspecified: Secondary | ICD-10-CM | POA: Diagnosis present

## 2022-11-28 DIAGNOSIS — A4902 Methicillin resistant Staphylococcus aureus infection, unspecified site: Secondary | ICD-10-CM | POA: Diagnosis not present

## 2022-11-28 DIAGNOSIS — Z9842 Cataract extraction status, left eye: Secondary | ICD-10-CM

## 2022-11-28 DIAGNOSIS — Z952 Presence of prosthetic heart valve: Secondary | ICD-10-CM

## 2022-11-28 DIAGNOSIS — J9602 Acute respiratory failure with hypercapnia: Secondary | ICD-10-CM | POA: Diagnosis not present

## 2022-11-28 DIAGNOSIS — J811 Chronic pulmonary edema: Secondary | ICD-10-CM | POA: Diagnosis not present

## 2022-11-28 DIAGNOSIS — I2723 Pulmonary hypertension due to lung diseases and hypoxia: Secondary | ICD-10-CM | POA: Diagnosis present

## 2022-11-28 DIAGNOSIS — Z7901 Long term (current) use of anticoagulants: Secondary | ICD-10-CM

## 2022-11-28 DIAGNOSIS — I509 Heart failure, unspecified: Secondary | ICD-10-CM | POA: Diagnosis not present

## 2022-11-28 DIAGNOSIS — M869 Osteomyelitis, unspecified: Secondary | ICD-10-CM | POA: Diagnosis not present

## 2022-11-28 DIAGNOSIS — R5381 Other malaise: Secondary | ICD-10-CM | POA: Diagnosis not present

## 2022-11-28 DIAGNOSIS — L089 Local infection of the skin and subcutaneous tissue, unspecified: Secondary | ICD-10-CM | POA: Diagnosis present

## 2022-11-28 DIAGNOSIS — L89312 Pressure ulcer of right buttock, stage 2: Secondary | ICD-10-CM | POA: Diagnosis not present

## 2022-11-28 DIAGNOSIS — L97529 Non-pressure chronic ulcer of other part of left foot with unspecified severity: Secondary | ICD-10-CM | POA: Diagnosis present

## 2022-11-28 DIAGNOSIS — L97909 Non-pressure chronic ulcer of unspecified part of unspecified lower leg with unspecified severity: Secondary | ICD-10-CM | POA: Diagnosis not present

## 2022-11-28 DIAGNOSIS — D6832 Hemorrhagic disorder due to extrinsic circulating anticoagulants: Secondary | ICD-10-CM | POA: Diagnosis present

## 2022-11-28 DIAGNOSIS — I5040 Unspecified combined systolic (congestive) and diastolic (congestive) heart failure: Secondary | ICD-10-CM | POA: Diagnosis not present

## 2022-11-28 DIAGNOSIS — Z66 Do not resuscitate: Secondary | ICD-10-CM | POA: Diagnosis not present

## 2022-11-28 DIAGNOSIS — T148XXA Other injury of unspecified body region, initial encounter: Secondary | ICD-10-CM | POA: Diagnosis not present

## 2022-11-28 DIAGNOSIS — J439 Emphysema, unspecified: Secondary | ICD-10-CM | POA: Diagnosis not present

## 2022-11-28 DIAGNOSIS — Z8701 Personal history of pneumonia (recurrent): Secondary | ICD-10-CM

## 2022-11-28 DIAGNOSIS — I5042 Chronic combined systolic (congestive) and diastolic (congestive) heart failure: Secondary | ICD-10-CM | POA: Diagnosis present

## 2022-11-28 DIAGNOSIS — L97519 Non-pressure chronic ulcer of other part of right foot with unspecified severity: Secondary | ICD-10-CM | POA: Diagnosis present

## 2022-11-28 DIAGNOSIS — L0390 Cellulitis, unspecified: Secondary | ICD-10-CM | POA: Diagnosis not present

## 2022-11-28 DIAGNOSIS — R6521 Severe sepsis with septic shock: Secondary | ICD-10-CM | POA: Diagnosis not present

## 2022-11-28 DIAGNOSIS — I2582 Chronic total occlusion of coronary artery: Secondary | ICD-10-CM | POA: Diagnosis present

## 2022-11-28 DIAGNOSIS — L89152 Pressure ulcer of sacral region, stage 2: Secondary | ICD-10-CM | POA: Diagnosis present

## 2022-11-28 DIAGNOSIS — J9811 Atelectasis: Secondary | ICD-10-CM | POA: Diagnosis not present

## 2022-11-28 DIAGNOSIS — A419 Sepsis, unspecified organism: Secondary | ICD-10-CM | POA: Diagnosis not present

## 2022-11-28 DIAGNOSIS — Z961 Presence of intraocular lens: Secondary | ICD-10-CM | POA: Diagnosis present

## 2022-11-28 DIAGNOSIS — Z96653 Presence of artificial knee joint, bilateral: Secondary | ICD-10-CM | POA: Diagnosis present

## 2022-11-28 DIAGNOSIS — Z515 Encounter for palliative care: Secondary | ICD-10-CM | POA: Diagnosis not present

## 2022-11-28 DIAGNOSIS — I4891 Unspecified atrial fibrillation: Secondary | ICD-10-CM | POA: Diagnosis present

## 2022-11-28 DIAGNOSIS — R531 Weakness: Secondary | ICD-10-CM | POA: Diagnosis not present

## 2022-11-28 DIAGNOSIS — M86672 Other chronic osteomyelitis, left ankle and foot: Secondary | ICD-10-CM | POA: Diagnosis present

## 2022-11-28 DIAGNOSIS — R627 Adult failure to thrive: Secondary | ICD-10-CM | POA: Diagnosis not present

## 2022-11-28 DIAGNOSIS — Z8614 Personal history of Methicillin resistant Staphylococcus aureus infection: Secondary | ICD-10-CM | POA: Diagnosis not present

## 2022-11-28 DIAGNOSIS — J9601 Acute respiratory failure with hypoxia: Secondary | ICD-10-CM | POA: Diagnosis present

## 2022-11-28 DIAGNOSIS — I5043 Acute on chronic combined systolic (congestive) and diastolic (congestive) heart failure: Secondary | ICD-10-CM | POA: Diagnosis not present

## 2022-11-28 DIAGNOSIS — M86671 Other chronic osteomyelitis, right ankle and foot: Secondary | ICD-10-CM | POA: Diagnosis present

## 2022-11-28 DIAGNOSIS — L89316 Pressure-induced deep tissue damage of right buttock: Secondary | ICD-10-CM | POA: Diagnosis present

## 2022-11-28 DIAGNOSIS — J90 Pleural effusion, not elsewhere classified: Principal | ICD-10-CM | POA: Diagnosis present

## 2022-11-28 DIAGNOSIS — E11621 Type 2 diabetes mellitus with foot ulcer: Secondary | ICD-10-CM | POA: Diagnosis present

## 2022-11-28 DIAGNOSIS — N390 Urinary tract infection, site not specified: Secondary | ICD-10-CM | POA: Diagnosis not present

## 2022-11-28 DIAGNOSIS — Z8679 Personal history of other diseases of the circulatory system: Secondary | ICD-10-CM

## 2022-11-28 DIAGNOSIS — Z9841 Cataract extraction status, right eye: Secondary | ICD-10-CM

## 2022-11-28 DIAGNOSIS — I4821 Permanent atrial fibrillation: Secondary | ICD-10-CM | POA: Diagnosis not present

## 2022-11-28 DIAGNOSIS — E876 Hypokalemia: Secondary | ICD-10-CM | POA: Diagnosis not present

## 2022-11-28 DIAGNOSIS — T45515A Adverse effect of anticoagulants, initial encounter: Secondary | ICD-10-CM | POA: Diagnosis present

## 2022-11-28 DIAGNOSIS — M86379 Chronic multifocal osteomyelitis, unspecified ankle and foot: Secondary | ICD-10-CM | POA: Diagnosis not present

## 2022-11-28 DIAGNOSIS — J918 Pleural effusion in other conditions classified elsewhere: Secondary | ICD-10-CM | POA: Diagnosis not present

## 2022-11-28 DIAGNOSIS — L97524 Non-pressure chronic ulcer of other part of left foot with necrosis of bone: Secondary | ICD-10-CM | POA: Diagnosis not present

## 2022-11-28 DIAGNOSIS — I878 Other specified disorders of veins: Secondary | ICD-10-CM | POA: Diagnosis present

## 2022-11-28 DIAGNOSIS — B9562 Methicillin resistant Staphylococcus aureus infection as the cause of diseases classified elsewhere: Secondary | ICD-10-CM | POA: Diagnosis not present

## 2022-11-28 DIAGNOSIS — Z8249 Family history of ischemic heart disease and other diseases of the circulatory system: Secondary | ICD-10-CM

## 2022-11-28 DIAGNOSIS — K7200 Acute and subacute hepatic failure without coma: Secondary | ICD-10-CM | POA: Diagnosis present

## 2022-11-28 DIAGNOSIS — G9341 Metabolic encephalopathy: Secondary | ICD-10-CM

## 2022-11-28 DIAGNOSIS — L89322 Pressure ulcer of left buttock, stage 2: Secondary | ICD-10-CM | POA: Diagnosis present

## 2022-11-28 DIAGNOSIS — I87319 Chronic venous hypertension (idiopathic) with ulcer of unspecified lower extremity: Secondary | ICD-10-CM | POA: Diagnosis not present

## 2022-11-28 DIAGNOSIS — I482 Chronic atrial fibrillation, unspecified: Secondary | ICD-10-CM | POA: Diagnosis not present

## 2022-11-28 DIAGNOSIS — I2722 Pulmonary hypertension due to left heart disease: Secondary | ICD-10-CM | POA: Diagnosis present

## 2022-11-28 DIAGNOSIS — Z79899 Other long term (current) drug therapy: Secondary | ICD-10-CM | POA: Diagnosis not present

## 2022-11-28 DIAGNOSIS — L89326 Pressure-induced deep tissue damage of left buttock: Secondary | ICD-10-CM | POA: Diagnosis present

## 2022-11-28 DIAGNOSIS — J850 Gangrene and necrosis of lung: Secondary | ICD-10-CM | POA: Diagnosis not present

## 2022-11-28 DIAGNOSIS — I5021 Acute systolic (congestive) heart failure: Secondary | ICD-10-CM | POA: Diagnosis not present

## 2022-11-28 DIAGNOSIS — D509 Iron deficiency anemia, unspecified: Secondary | ICD-10-CM | POA: Diagnosis present

## 2022-11-28 DIAGNOSIS — Z87440 Personal history of urinary (tract) infections: Secondary | ICD-10-CM

## 2022-11-28 DIAGNOSIS — I130 Hypertensive heart and chronic kidney disease with heart failure and stage 1 through stage 4 chronic kidney disease, or unspecified chronic kidney disease: Secondary | ICD-10-CM | POA: Diagnosis present

## 2022-11-28 DIAGNOSIS — R195 Other fecal abnormalities: Secondary | ICD-10-CM | POA: Diagnosis not present

## 2022-11-28 DIAGNOSIS — Z955 Presence of coronary angioplasty implant and graft: Secondary | ICD-10-CM

## 2022-11-28 DIAGNOSIS — Z95828 Presence of other vascular implants and grafts: Secondary | ICD-10-CM

## 2022-11-28 DIAGNOSIS — Z7189 Other specified counseling: Secondary | ICD-10-CM | POA: Diagnosis not present

## 2022-11-28 DIAGNOSIS — Z8379 Family history of other diseases of the digestive system: Secondary | ICD-10-CM

## 2022-11-28 DIAGNOSIS — Z452 Encounter for adjustment and management of vascular access device: Secondary | ICD-10-CM | POA: Diagnosis not present

## 2022-11-28 DIAGNOSIS — Z7982 Long term (current) use of aspirin: Secondary | ICD-10-CM

## 2022-11-28 LAB — COMPREHENSIVE METABOLIC PANEL
ALT: 19 U/L (ref 0–44)
AST: 46 U/L — ABNORMAL HIGH (ref 15–41)
Albumin: 2.5 g/dL — ABNORMAL LOW (ref 3.5–5.0)
Alkaline Phosphatase: 72 U/L (ref 38–126)
Anion gap: 12 (ref 5–15)
BUN: 26 mg/dL — ABNORMAL HIGH (ref 8–23)
CO2: 30 mmol/L (ref 22–32)
Calcium: 7.8 mg/dL — ABNORMAL LOW (ref 8.9–10.3)
Chloride: 98 mmol/L (ref 98–111)
Creatinine, Ser: 2.15 mg/dL — ABNORMAL HIGH (ref 0.61–1.24)
GFR, Estimated: 30 mL/min — ABNORMAL LOW (ref >60)
Glucose, Bld: 115 mg/dL — ABNORMAL HIGH (ref 70–99)
Potassium: 3.8 mmol/L (ref 3.5–5.1)
Sodium: 140 mmol/L (ref 135–145)
Total Bilirubin: 0.8 mg/dL (ref 0.3–1.2)
Total Protein: 5.8 g/dL — ABNORMAL LOW (ref 6.5–8.1)

## 2022-11-28 LAB — CBC WITH DIFFERENTIAL/PLATELET
Abs Immature Granulocytes: 0.05 10*3/uL (ref 0.00–0.07)
Basophils Absolute: 0 10*3/uL (ref 0.0–0.1)
Basophils Relative: 0 %
Eosinophils Absolute: 0.2 10*3/uL (ref 0.0–0.5)
Eosinophils Relative: 5 %
HCT: 27.8 % — ABNORMAL LOW (ref 39.0–52.0)
Hemoglobin: 7.9 g/dL — ABNORMAL LOW (ref 13.0–17.0)
Immature Granulocytes: 1 %
Lymphocytes Relative: 8 %
Lymphs Abs: 0.4 10*3/uL — ABNORMAL LOW (ref 0.7–4.0)
MCH: 29.2 pg (ref 26.0–34.0)
MCHC: 28.4 g/dL — ABNORMAL LOW (ref 30.0–36.0)
MCV: 102.6 fL — ABNORMAL HIGH (ref 80.0–100.0)
Monocytes Absolute: 0.8 10*3/uL (ref 0.1–1.0)
Monocytes Relative: 15 %
Neutro Abs: 3.8 10*3/uL (ref 1.7–7.7)
Neutrophils Relative %: 71 %
Platelets: 261 10*3/uL (ref 150–400)
RBC: 2.71 MIL/uL — ABNORMAL LOW (ref 4.22–5.81)
RDW: 18.6 % — ABNORMAL HIGH (ref 11.5–15.5)
WBC: 5.3 10*3/uL (ref 4.0–10.5)
nRBC: 0 % (ref 0.0–0.2)

## 2022-11-28 LAB — BRAIN NATRIURETIC PEPTIDE: B Natriuretic Peptide: 1270.5 pg/mL — ABNORMAL HIGH (ref 0.0–100.0)

## 2022-11-28 MED ORDER — AMIODARONE HCL 200 MG PO TABS
200.0000 mg | ORAL_TABLET | Freq: Every day | ORAL | Status: DC
  Administered 2022-11-30 – 2022-12-01 (×2): 200 mg via ORAL
  Filled 2022-11-28 (×3): qty 1

## 2022-11-28 MED ORDER — FUROSEMIDE 40 MG PO TABS
40.0000 mg | ORAL_TABLET | Freq: Every day | ORAL | Status: DC
  Filled 2022-11-28: qty 1

## 2022-11-28 MED ORDER — ONDANSETRON HCL 4 MG PO TABS: 4.0000 mg | ORAL_TABLET | Freq: Four times a day (QID) | ORAL | Status: DC | PRN

## 2022-11-28 MED ORDER — ALBUTEROL SULFATE (2.5 MG/3ML) 0.083% IN NEBU
2.5000 mg | INHALATION_SOLUTION | RESPIRATORY_TRACT | Status: DC | PRN
  Administered 2022-11-29 – 2022-12-02 (×2): 2.5 mg via RESPIRATORY_TRACT
  Filled 2022-11-28 (×2): qty 3

## 2022-11-28 MED ORDER — METOPROLOL SUCCINATE ER 25 MG PO TB24
25.0000 mg | ORAL_TABLET | Freq: Every day | ORAL | Status: DC
  Filled 2022-11-28: qty 1

## 2022-11-28 MED ORDER — HEPARIN SODIUM (PORCINE) 5000 UNIT/ML IJ SOLN
5000.0000 [IU] | Freq: Three times a day (TID) | INTRAMUSCULAR | Status: DC
  Administered 2022-11-28 – 2022-12-08 (×26): 5000 [IU] via SUBCUTANEOUS
  Filled 2022-11-28 (×26): qty 1

## 2022-11-28 MED ORDER — PANTOPRAZOLE SODIUM 40 MG PO TBEC
40.0000 mg | DELAYED_RELEASE_TABLET | Freq: Every day | ORAL | Status: DC
  Administered 2022-11-28 – 2022-12-08 (×11): 40 mg via ORAL
  Filled 2022-11-28 (×11): qty 1

## 2022-11-28 MED ORDER — LEVOCETIRIZINE DIHYDROCHLORIDE 5 MG PO TABS: 5.0000 mg | ORAL_TABLET | Freq: Every day | ORAL | Status: DC

## 2022-11-28 MED ORDER — LINEZOLID 600 MG/300ML IV SOLN
600.0000 mg | Freq: Two times a day (BID) | INTRAVENOUS | Status: DC
  Administered 2022-11-28 – 2022-12-05 (×14): 600 mg via INTRAVENOUS
  Filled 2022-11-28 (×15): qty 300

## 2022-11-28 MED ORDER — ACETAMINOPHEN 325 MG PO TABS
650.0000 mg | ORAL_TABLET | Freq: Four times a day (QID) | ORAL | Status: DC | PRN
  Administered 2022-11-28 – 2022-11-30 (×2): 650 mg via ORAL
  Filled 2022-11-28 (×2): qty 2

## 2022-11-28 MED ORDER — TRAMADOL HCL 50 MG PO TABS
50.0000 mg | ORAL_TABLET | Freq: Four times a day (QID) | ORAL | Status: DC | PRN
  Administered 2022-11-29 (×2): 50 mg via ORAL
  Filled 2022-11-28 (×2): qty 1

## 2022-11-28 MED ORDER — ONDANSETRON HCL 4 MG/2ML IJ SOLN: 4.0000 mg | Freq: Four times a day (QID) | INTRAMUSCULAR | Status: DC | PRN

## 2022-11-28 MED ORDER — TAMSULOSIN HCL 0.4 MG PO CAPS
0.4000 mg | ORAL_CAPSULE | Freq: Every day | ORAL | Status: DC
  Administered 2022-11-28 – 2022-12-08 (×11): 0.4 mg via ORAL
  Filled 2022-11-28 (×11): qty 1

## 2022-11-28 MED ORDER — ACETAMINOPHEN 650 MG RE SUPP: 650.0000 mg | Freq: Four times a day (QID) | RECTAL | Status: DC | PRN

## 2022-11-28 MED ORDER — LORATADINE 10 MG PO TABS
10.0000 mg | ORAL_TABLET | Freq: Every day | ORAL | Status: DC
  Administered 2022-11-28 – 2022-12-08 (×11): 10 mg via ORAL
  Filled 2022-11-28 (×11): qty 1

## 2022-11-28 NOTE — Assessment & Plan Note (Signed)
[name] eliquis has been on hold for >48 hours in anticipation of his scheduled bilateral toe amputation at [facility]. Depending on results of his CT chest, he may need to be restarted on Eliquis if chest tube/lytics are not planned.  Continue with toprol-xl for rate control.

## 2022-11-28 NOTE — Subjective & Objective (Signed)
CC: loculated pleural effusion(left side), hypoxia HPI: 82 year old male prior history of chronic systolic heart failure EF of 40 to 45%, history of bioprosthetic aortic valve replacement, history of aortic aneurysm, peripheral vascular disease, chronic A-fib on Eliquis, chronic wounds on his bilateral lower extremities, history of anemia, presents to the ER at [facility] yesterday for hypoxia.  He had been seen in wound care clinic and was noted to have a room air O2 saturations of 82%.  He was sent to the ER.  Last month, he was admitted to [facility] where he was diagnosed with MRSA pneumonia and septicemia.  Unclear if he had a transesophageal echocardiogram done.  He was discharged home on IV Cubicin for his septicemia and p.o. doxycycline for his MRSA pneumonia.  Reportedly he had completed his doxycycline for his MRSA pneumonia and had been continued on IV Cubicin.  He has a PICC line.  Patient denied any recent fevers, shortness of breath.  On evaluation in the [facility] ER, he had a CT scan which demonstrated a large loculated left-sided pleural effusion.  He also had a small right pleural effusion.  He also had a cavitary lesion in his right upper lobe that was consistent with his history of MRSA pneumonia.  He underwent thoracentesis on his left side today by interventional radiology at [facility] which removed 900 cc of fluid from his pleural space.  Reportedly labs were sent to the chemistry lab at [facility].  However no reports of his fluid analysis were sent with him upon transport to [facility] health.  Hospitalist at [facility] declined admission due to no infectious disease specialist available.  Patient was getting ready to have bilateral second toe amputations performed by podiatry this week.  He has been off Eliquis for about 48 hours.  Patient has a long history of wounds on his legs.  He does not have a history of diabetes.  Apparently patient  had renal insufficiency caused by IV vancomycin when he was admitted to the hospital in May 2024 for his MRSA pneumonia and septicemia.  His wife refused for him to be restarted on IV vancomycin today at [facility].  Pharmacist there started him on Zyvox.  Patient transferred to [facility] for infectious disease consultation.

## 2022-11-28 NOTE — Telephone Encounter (Signed)
Attempt to call the pt wife [name] [name] (DPR), granddaughter answered the phone and stated her grandmother is at the hospital with the patient. The granddaughter provided another telephone number to contact pt wife. Attempted to call the pt wife, unable to leave VM.

## 2022-11-28 NOTE — Assessment & Plan Note (Addendum)
Pt has been on IV cubicin at home for MRSA septicemia. Unclear if pt had TEE at [facility] last month.  Reportedly pt had developed renal insufficiency when on IV vancomycin. Pt was also treated for MRSA pneumonia. Had been on po doxycycline at home and reportedly had completed this course.  Apparently, pt's wife objected to pt being started on IV vanco at [facility] and pt was started on zyvox. Will consult pharmacy for zyvox.  May need ID consult if Zyvox to be continued.

## 2022-11-28 NOTE — Assessment & Plan Note (Signed)
Observation med/tele bed. RH documents 900 ml of pleural fluid removed from left side thoracentesis. RH documents that labs were sent but no reports were sent with patient. Will repeat CT chest without contrast to further evaluate if pt still has pleural effusion. If pleural effusion remains loculated, he may need PCCM consult for chest tube and lytics.

## 2022-11-28 NOTE — Telephone Encounter (Signed)
Tried to return call to wife. Unable to leave voicemail

## 2022-11-28 NOTE — Telephone Encounter (Signed)
FYI Patient wife wanted to let you know Patient is at [facility] with MRSA and pneumonia again. He will be transferred to [facility] once abed becomes available.

## 2022-11-28 NOTE — Progress Notes (Signed)
Pt roomed on floor as a direct admission from [place]. Will initiate airborne precautions as special request made for an airborne prec room until discontinued. Pt is on 6 liters hi-flo oxygen. Awaiting admission orders

## 2022-11-28 NOTE — Assessment & Plan Note (Signed)
Elevated pro-BNP at [facility] to [id number]. Pt without any peripheral edema. No JVD. Hold off on IV lasix for now.

## 2022-11-28 NOTE — H&P (Addendum)
History and Physical    [name] [name] ZOX:[id number] DOB: 14-Dec-1940 DOA: 11/28/2022  DOS: the patient was seen and examined on 11/28/2022  PCP: Street, [name], MD   Patient coming from:  [facility]  I have personally briefly reviewed patient's old medical records in [facility]  CC: loculated pleural effusion(left side), hypoxia HPI: 82 year old male prior history of chronic systolic heart failure EF of 40 to 45%, history of bioprosthetic aortic valve replacement, history of aortic aneurysm, peripheral vascular disease, chronic A-fib on Eliquis, chronic wounds on his bilateral lower extremities, history of anemia, presents to the ER at [facility] yesterday for hypoxia.  He had been seen in wound care clinic and was noted to have a room air O2 saturations of 82%.  He was sent to the ER.  Last month, he was admitted to [facility] where he was diagnosed with MRSA pneumonia and septicemia.  Unclear if he had a transesophageal echocardiogram done.  He was discharged home on IV Cubicin for his septicemia and p.o. doxycycline for his MRSA pneumonia.  Reportedly he had completed his doxycycline for his MRSA pneumonia and had been continued on IV Cubicin.  He has a PICC line.  Patient denied any recent fevers, shortness of breath.  On evaluation in the [facility] ER, he had a CT scan which demonstrated a large loculated left-sided pleural effusion.  He also had a small right pleural effusion.  He also had a cavitary lesion in his right upper lobe that was consistent with his history of MRSA pneumonia.  He underwent thoracentesis on his left side today by interventional radiology at [facility] which removed 900 cc of fluid from his pleural space.  Reportedly labs were sent to the chemistry lab at [facility].  However no reports of his fluid analysis were sent with him upon transport to [facility].  Hospitalist at [facility] declined  admission due to no infectious disease specialist available.  Patient was getting ready to have bilateral second toe amputations performed by podiatry this week.  He has been off Eliquis for about 48 hours.  Patient has a long history of wounds on his legs.  He does not have a history of diabetes.  Apparently patient had renal insufficiency caused by IV vancomycin when he was admitted to the hospital in May 2024 for his MRSA pneumonia and septicemia.  His wife refused for him to be restarted on IV vancomycin today at [facility].  Pharmacist there started him on Zyvox.  Patient transferred to [facility] for infectious disease consultation.   ED Course: noted to be hypoxic in wound care clinic. CT chest shows bilateral pleural effusion L > R. Pt had 900 ml thoracentesis on left side. WBC 5.8, procalcitonin 0.12  Review of Systems:  Review of Systems  Constitutional:  Negative for chills and fever.  HENT: Negative.    Eyes: Negative.   Respiratory:  Negative for cough and shortness of breath.   Cardiovascular: Negative.   Skin:        Chronic wounds on his lower legs and toes. Leg wounds from abrasions, not from lying in bed.  Neurological:  Positive for weakness.  Endo/Heme/Allergies: Negative.   All other systems reviewed and are negative.   Past Medical History:  Diagnosis Date   Acute on chronic diastolic heart failure (HCC) 08/22/2013   Acute on chronic respiratory failure with hypoxia (HCC) 10/03/2019   Acute on chronic systolic heart failure (HCC) 05/11/2013  EF from 35% to 50-55% on 6/14 ECHO.    Acute respiratory failure with hypoxia (HCC) 10/07/2013   AKI (acute kidney injury) (HCC) 12/17/2019   Aortic atherosclerosis (HCC) 05/05/2013   Aortic dissection (HCC) 05/04/2013    Descending only, had aneurysm of ascending but no dissection   02/12/2014 Stable aortic stent graft over the aortic arch and descending thoracic aorta with significant reduction in the mural  thrombus of the native aneurysm sac. No evidence of dissection or endoleak.  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 No evidence of abdominal aortic dissection.  Occluded proximal left subclav   Aortic valve insufficiency    Arthritis    "probably in my knees before they replaced them" (01/28/2015)   Atrial fibrillation (HCC) 05/21/2013   Atrial flutter (HCC)    Bradycardia 08/23/2013   CAD (coronary artery disease)    Chest pain 01/28/2015   CHF (congestive heart failure) (HCC)    Chronic diastolic heart failure (HCC) 01/28/2015   Descending aortic aneurysm (HCC)    DVT (deep venous thrombosis) (HCC)    ?LLE post knee surgery   Dyspnea 09/2019   HCAP (healthcare-associated pneumonia) 10/11/2013   History of blood transfusion    "after one of my knee surgeries"   HTN (hypertension)    Hyperlipidemia    Hypotension 12/17/2019   Incarcerated left inguinal hernia s/p repair 12/02/2019 12/01/2019   Insomnia 09/04/2013   Multiple fractures of ribs, right side, init for clos fx 09/04/2019   Near syncope 01/28/2015   Pressure injury of skin 10/06/2019   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 12/18/2019 12/17/2019   Recurrent UTI 10/06/2013   Shock circulatory (HCC) 10/07/2013   Thoracic aneurysm    Tobacco abuse    Urinary retention 10/06/2013   UTI (urinary tract infection) 10/06/2013    Past Surgical History:  Procedure Laterality Date   AORTIC VALVE REPLACEMENT  02/19/2009   AORTO-FEMORAL BYPASS GRAFT  03/2010   ascending aortic and arch aneurysm repair/notes 04/09/2009   CARDIAC VALVE REPLACEMENT     CAROTID-SUBCLAVIAN BYPASS GRAFT Left 08/26/2013   Procedure: BYPASS GRAFT CAROTID-SUBCLAVIAN;  Surgeon: Nada Libman, MD;  Location: [facility] OR;  Service: Vascular;  Laterality: Left;   CATARACT EXTRACTION W/ INTRAOCULAR LENS  IMPLANT, BILATERAL Bilateral    ENDOVASCULAR STENT INSERTION N/A 08/26/2013   Procedure:  THORACIC STENT GRAFT INSERTION;   Surgeon: Nada Libman, MD;  Location: [facility] OR;  Service: Vascular;  Laterality: N/A;   EYE SURGERY     INGUINAL HERNIA REPAIR Left 12/02/2019   Procedure: REPAIR LEFT INGUINAL HERNIA WITH MESH;  Surgeon: Violeta Gelinas, MD;  Location: [facility] OR;  Service: General;  Laterality: Left;   INGUINAL HERNIA REPAIR Left 12/18/2019   Procedure: LAPAROSCOPIC ASSISTED REPAIR OF RECURRENT INCARCERATED LEFT INGUINAL HERNIA WITH MESH; LAPAOSCOPIC REPAIR OF SEROSAL TEAR;  Surgeon: Gaynelle Adu, MD;  Location: [facility] OR;  Service: General;  Laterality: Left;   INSERTION OF MESH Left 12/02/2019   Procedure: INSERTION OF MESH;  Surgeon: Violeta Gelinas, MD;  Location: [facility] OR;  Service: General;  Laterality: Left;   JOINT REPLACEMENT     KNEE ARTHROSCOPY Bilateral    REPLACEMENT TOTAL KNEE Bilateral    right groin lymphocele Right 03/29/2009   RIGHT/LEFT HEART CATH AND CORONARY ANGIOGRAPHY N/A 10/06/2019   Procedure: RIGHT/LEFT HEART CATH AND CORONARY ANGIOGRAPHY;  Surgeon: Lyn Records, MD;  Location: [facility] INVASIVE CV LAB;  Service: Cardiovascular;  Laterality: N/A;   THORACENTESIS  2010  X 2   TONSILLECTOMY       reports that he quit smoking about 48 years ago. His smoking use included cigarettes. He has never used smokeless tobacco. He reports that he does not drink alcohol and does not use drugs.  Allergies  Allergen Reactions   Lortab [Hydrocodone-Acetaminophen] Hives    No trouble breathing   Morphine And Codeine Hives   Other Other (See Comments)    Staples from surgery caused infection    Family History  Problem Relation Age of Onset   Celiac disease Daughter    Aortic aneurysm Father    Hypertension Mother    Heart attack Neg Hx    Stroke Neg Hx     Prior to Admission medications   Medication Sig Start Date End Date Taking? Authorizing Provider  acetaminophen (TYLENOL) 500 MG tablet Take 2 tablets (1,000 mg total) by mouth every 6 (six) hours as needed. Patient taking differently: Take 1,000 mg by  mouth every 6 (six) hours as needed for mild pain or moderate pain. 09/08/19  Yes Barnetta Chapel, PA-C  albuterol (PROVENTIL) (2.5 MG/3ML) 0.083% nebulizer solution Take by nebulization. 06/22/22  Yes [provider]  amiodarone (PACERONE) 200 MG tablet Take 1 tablet (200 mg total) by mouth daily. 07/12/22  Yes [name], MD  amoxicillin (AMOXIL) 500 MG capsule Take 4 capsules by mouth 30-60 min prior to dental procedure. Patient taking differently: Take 1,000 mg by mouth See admin instructions. Take 4 capsules by mouth 30-60 min prior to dental procedures 03/15/20  Yes [name], MD  apixaban (ELIQUIS) 2.5 MG TABS tablet Take 1 tablet (2.5 mg total) by mouth 2 (two) times daily. 11/15/22  Yes [name], MD  DAPTOmycin (CUBICIN) 500 MG injection Inject 500 mg into the vein daily. 11/28/22  Yes [provider]  ferrous sulfate (SV IRON) 325 (65 FE) MG tablet Take 1 tablet (325 mg total) by mouth daily with breakfast. 03/17/22  Yes [name], MD  furosemide (LASIX) 40 MG tablet Take 1 tablet (40 mg total) by mouth 2 (two) times daily as needed. Patient taking differently: Take 40 mg by mouth daily. 03/17/22  Yes [name], MD  lansoprazole (PREVACID) 30 MG capsule Take 30 mg by mouth daily. 08/13/19  Yes [provider]  levocetirizine (XYZAL) 5 MG tablet Take 5 mg by mouth daily. 04/21/21  Yes [provider]  metoprolol succinate (TOPROL-XL) 25 MG 24 hr tablet Take 25 mg by mouth daily.   Yes [provider]  Multiple Vitamin (MULTIVITAMIN WITH MINERALS) TABS tablet Take 1 tablet by mouth daily.   Yes [provider]  Multiple Vitamins-Minerals (ZINC PO) Take 1 tablet by mouth daily at 6 (six) AM.   Yes [provider]  tamsulosin (FLOMAX) 0.4 MG CAPS capsule Take 0.4 mg by mouth.   Yes [provider]  traMADol (ULTRAM) 50 MG tablet Take 50 mg by mouth every 6 (six) hours as needed for  moderate pain. 11/27/22  Yes [provider]  atorvastatin (LIPITOR) 10 MG tablet Take 1 tablet by mouth once daily Patient not taking: Reported on 11/28/2022 07/18/22   [name], MD  cefdinir (OMNICEF) 300 MG capsule Take 300 mg by mouth 2 (two) times daily. Patient not taking: Reported on 11/28/2022 06/22/22   [provider]  losartan (COZAAR) 25 MG tablet Take 1 tablet by mouth once daily Patient not taking: Reported on 11/28/2022 08/02/22   Lance Muss  S, MD  metoprolol succinate (TOPROL-XL) 100 MG 24 hr tablet TAKE 1 TABLET BY MOUTH ONCE DAILY WITH  OR  IMMEDIATELY  FOLLOWING  A  MEAL Patient not taking: Reported on 11/28/2022 08/02/22   [name], MD    Physical Exam: Vitals:   11/28/22 1827  BP: (!) 114/94  Pulse: 91  Resp: 17  Temp: 98.2 F (36.8 C)  SpO2: 100%    Physical Exam Vitals and nursing note reviewed.  Constitutional:      General: He is not in acute distress.    Comments: Appears chronically ill  HENT:     Head: Normocephalic and atraumatic.     Nose: Nose normal.  Eyes:     General: No scleral icterus.    Pupils: Pupils are equal, round, and reactive to light.  Cardiovascular:     Rate and Rhythm: Normal rate. Rhythm irregular.  Pulmonary:     Effort: No tachypnea or respiratory distress.     Breath sounds: Examination of the right-lower field reveals rales. Examination of the left-lower field reveals rales. Rales present.     Comments: No JVD. No peripheral edema Abdominal:     General: Abdomen is flat. Bowel sounds are normal. There is no distension.     Palpations: Abdomen is soft.     Tenderness: There is no abdominal tenderness.  Skin:    Capillary Refill: Capillary refill takes less than 2 seconds.     Comments: Outside lateral Left lower leg small dime-size ulceration Left heel quarter-size unstageable ulceration  Dorsum right foot, lateral aspect. Scabbed over abrasion Right heel quarter-size  unstageable ulceration  Bilateral 2nd toes with denuded skin. Visible tendons  Neurological:     General: No focal deficit present.     Mental Status: He is alert and oriented to person, place, and time.      Labs on Admission: I have personally reviewed following labs and imaging studies        Radiological Exams on Admission:    EKG: My personal interpretation of EKG shows: afib     Assessment/Plan Principal Problem:   Loculated pleural effusion Active Problems:   Acute respiratory failure with hypoxia (HCC)   S/P AVR (aortic valve replacement)   Chronic systolic CHF (congestive heart failure) (HCC)   Atrial fibrillation (HCC)   Chronic wound   Normocytic anemia   MRSA bacteremia    Assessment and Plan: * Loculated pleural effusion Observation med/tele bed. RH documents 900 ml of pleural fluid removed from left side thoracentesis. RH documents that labs were sent but no reports were sent with patient. Will repeat CT chest without contrast to further evaluate if pt still has pleural effusion. If pleural effusion remains loculated, he may need PCCM consult for chest tube and lytics.  Acute respiratory failure with hypoxia (HCC) Secondary to his pleural effusion. Pt not on home O2. Wean O2 to RA.  Chronic wound Pt with multiple wounds on bilateral lower legs/heels. Also with bilateral 2nd toe wounds. These are all present on admission. Pt states he is scheduled for outpatient bilateral 2nd toe amputations at [facility]. Pt advised to call and cancel this amputation with his doctor. He will have to reschedule this surgery.  Atrial fibrillation (HCC) [name] eliquis has been on hold for >48 hours in anticipation of his scheduled bilateral toe amputation at [facility]. Depending on results of his CT chest, he may need to be restarted on Eliquis if chest tube/lytics are not planned.  Continue with  toprol-xl for rate control.  Chronic systolic CHF (congestive heart failure) (HCC) Elevated  pro-BNP at [facility] to 9870. Pt without any peripheral edema. No JVD. Hold off on IV lasix for now.  S/P AVR (aortic valve replacement) Stable.  MRSA bacteremia Pt has been on IV cubicin at home for MRSA septicemia. Unclear if pt had TEE at [facility] last month.  Reportedly pt had developed renal insufficiency when on IV vancomycin. Pt was also treated for MRSA pneumonia. Had been on po doxycycline at home and reportedly had completed this course.  Apparently, pt's wife objected to pt being started on IV vanco at [facility] and pt was started on zyvox. Will consult pharmacy for zyvox.  May need ID consult if Zyvox to be continued.  Normocytic anemia Hgb of 8.3. will repeat CBC. Check type and screen. Pt denies any recent bleeding. Pt with chronic anemia but baseline HgB 9-10   DVT prophylaxis: SQ Heparin Code Status: Full Code Family Communication: no family at bedside  Disposition Plan: return home  Consults called: none  Admission status: Observation, Telemetry bed   Carollee Herter, DO [facility] 11/28/2022, 8:45 PM

## 2022-11-28 NOTE — Assessment & Plan Note (Signed)
Secondary to his pleural effusion. Pt not on home O2. Wean O2 to RA.

## 2022-11-28 NOTE — Telephone Encounter (Signed)
Follow Up:    Wife is returning call from today.

## 2022-11-28 NOTE — Telephone Encounter (Signed)
[name] spouse called to let office know that pt is in [place] ED waiting for a room because he has MRSA and Pneumonia. She'd like a callback at [phone] to discuss further. Please advise

## 2022-11-28 NOTE — Assessment & Plan Note (Addendum)
Hgb of 8.3. will repeat CBC. Check type and screen. Pt denies any recent bleeding. Pt with chronic anemia but baseline HgB 9-10

## 2022-11-28 NOTE — Telephone Encounter (Signed)
[name] daughter returning nurses call. Please advise 

## 2022-11-28 NOTE — Assessment & Plan Note (Addendum)
Pt with multiple wounds on bilateral lower legs/heels. Also with bilateral 2nd toe wounds. These are all present on admission. Pt states he is scheduled for outpatient bilateral 2nd toe amputations at [facility]. Pt advised to call and cancel this amputation with his doctor. He will have to reschedule this surgery.

## 2022-11-28 NOTE — Progress Notes (Signed)
MEWS Yellow. Pt BP 78/65, MAP 71. HR 90. RR 20. On 6 L Miller at 93%. Temp 100.5. Metoprolol, amiodarone and lasix held. Provider notified. Manual BP 88/55 and 88/54. Pt denies dizziness/SOB/CP. CT and labs completed. Pts wife reported that pt hasn't urinated all day. Bladder scan shows 36 cc's and pt denies the urge to urinate. Pt denies pain. Noted with tremors in upper extremities. Wife states the tremors began during his initial hospitalization in May.

## 2022-11-28 NOTE — Progress Notes (Signed)
Verified chest x-ray present and order from MD to use PICC. Unit RN made aware

## 2022-11-28 NOTE — Assessment & Plan Note (Signed)
Stable

## 2022-11-29 ENCOUNTER — Other Ambulatory Visit: Payer: Self-pay

## 2022-11-29 ENCOUNTER — Encounter (HOSPITAL_COMMUNITY): Payer: Self-pay | Admitting: Internal Medicine

## 2022-11-29 DIAGNOSIS — I130 Hypertensive heart and chronic kidney disease with heart failure and stage 1 through stage 4 chronic kidney disease, or unspecified chronic kidney disease: Secondary | ICD-10-CM | POA: Diagnosis present

## 2022-11-29 DIAGNOSIS — B9562 Methicillin resistant Staphylococcus aureus infection as the cause of diseases classified elsewhere: Secondary | ICD-10-CM | POA: Diagnosis not present

## 2022-11-29 DIAGNOSIS — I70229 Atherosclerosis of native arteries of extremities with rest pain, unspecified extremity: Secondary | ICD-10-CM | POA: Diagnosis not present

## 2022-11-29 DIAGNOSIS — D649 Anemia, unspecified: Secondary | ICD-10-CM | POA: Diagnosis present

## 2022-11-29 DIAGNOSIS — I739 Peripheral vascular disease, unspecified: Secondary | ICD-10-CM | POA: Diagnosis present

## 2022-11-29 DIAGNOSIS — L97524 Non-pressure chronic ulcer of other part of left foot with necrosis of bone: Secondary | ICD-10-CM | POA: Diagnosis not present

## 2022-11-29 DIAGNOSIS — Z87891 Personal history of nicotine dependence: Secondary | ICD-10-CM | POA: Diagnosis not present

## 2022-11-29 DIAGNOSIS — R7401 Elevation of levels of liver transaminase levels: Secondary | ICD-10-CM | POA: Diagnosis not present

## 2022-11-29 DIAGNOSIS — S91102A Unspecified open wound of left great toe without damage to nail, initial encounter: Secondary | ICD-10-CM | POA: Diagnosis not present

## 2022-11-29 DIAGNOSIS — R627 Adult failure to thrive: Secondary | ICD-10-CM | POA: Diagnosis not present

## 2022-11-29 DIAGNOSIS — L89613 Pressure ulcer of right heel, stage 3: Secondary | ICD-10-CM | POA: Diagnosis present

## 2022-11-29 DIAGNOSIS — L89312 Pressure ulcer of right buttock, stage 2: Secondary | ICD-10-CM | POA: Diagnosis present

## 2022-11-29 DIAGNOSIS — J850 Gangrene and necrosis of lung: Secondary | ICD-10-CM | POA: Diagnosis present

## 2022-11-29 DIAGNOSIS — J9602 Acute respiratory failure with hypercapnia: Secondary | ICD-10-CM | POA: Diagnosis present

## 2022-11-29 DIAGNOSIS — I482 Chronic atrial fibrillation, unspecified: Secondary | ICD-10-CM | POA: Diagnosis present

## 2022-11-29 DIAGNOSIS — D509 Iron deficiency anemia, unspecified: Secondary | ICD-10-CM | POA: Diagnosis not present

## 2022-11-29 DIAGNOSIS — I959 Hypotension, unspecified: Secondary | ICD-10-CM | POA: Diagnosis present

## 2022-11-29 DIAGNOSIS — R531 Weakness: Secondary | ICD-10-CM | POA: Diagnosis not present

## 2022-11-29 DIAGNOSIS — I081 Rheumatic disorders of both mitral and tricuspid valves: Secondary | ICD-10-CM | POA: Diagnosis present

## 2022-11-29 DIAGNOSIS — Z515 Encounter for palliative care: Secondary | ICD-10-CM | POA: Diagnosis not present

## 2022-11-29 DIAGNOSIS — N390 Urinary tract infection, site not specified: Secondary | ICD-10-CM | POA: Diagnosis not present

## 2022-11-29 DIAGNOSIS — Z1321 Encounter for screening for nutritional disorder: Secondary | ICD-10-CM | POA: Diagnosis not present

## 2022-11-29 DIAGNOSIS — J9601 Acute respiratory failure with hypoxia: Secondary | ICD-10-CM | POA: Diagnosis present

## 2022-11-29 DIAGNOSIS — R195 Other fecal abnormalities: Secondary | ICD-10-CM | POA: Diagnosis not present

## 2022-11-29 DIAGNOSIS — A4902 Methicillin resistant Staphylococcus aureus infection, unspecified site: Secondary | ICD-10-CM | POA: Diagnosis not present

## 2022-11-29 DIAGNOSIS — Z7189 Other specified counseling: Secondary | ICD-10-CM | POA: Diagnosis not present

## 2022-11-29 DIAGNOSIS — K7200 Acute and subacute hepatic failure without coma: Secondary | ICD-10-CM | POA: Diagnosis present

## 2022-11-29 DIAGNOSIS — A419 Sepsis, unspecified organism: Secondary | ICD-10-CM | POA: Diagnosis present

## 2022-11-29 DIAGNOSIS — M86671 Other chronic osteomyelitis, right ankle and foot: Secondary | ICD-10-CM | POA: Diagnosis present

## 2022-11-29 DIAGNOSIS — I5022 Chronic systolic (congestive) heart failure: Secondary | ICD-10-CM | POA: Diagnosis not present

## 2022-11-29 DIAGNOSIS — R5381 Other malaise: Secondary | ICD-10-CM | POA: Diagnosis present

## 2022-11-29 DIAGNOSIS — L89322 Pressure ulcer of left buttock, stage 2: Secondary | ICD-10-CM | POA: Diagnosis present

## 2022-11-29 DIAGNOSIS — I5042 Chronic combined systolic (congestive) and diastolic (congestive) heart failure: Secondary | ICD-10-CM | POA: Diagnosis present

## 2022-11-29 DIAGNOSIS — Z952 Presence of prosthetic heart valve: Secondary | ICD-10-CM | POA: Diagnosis not present

## 2022-11-29 DIAGNOSIS — L0390 Cellulitis, unspecified: Secondary | ICD-10-CM | POA: Diagnosis not present

## 2022-11-29 DIAGNOSIS — R7881 Bacteremia: Secondary | ICD-10-CM | POA: Diagnosis not present

## 2022-11-29 DIAGNOSIS — N1832 Chronic kidney disease, stage 3b: Secondary | ICD-10-CM | POA: Diagnosis present

## 2022-11-29 DIAGNOSIS — J948 Other specified pleural conditions: Secondary | ICD-10-CM | POA: Diagnosis not present

## 2022-11-29 DIAGNOSIS — I4821 Permanent atrial fibrillation: Secondary | ICD-10-CM | POA: Diagnosis present

## 2022-11-29 DIAGNOSIS — Z79899 Other long term (current) drug therapy: Secondary | ICD-10-CM | POA: Diagnosis not present

## 2022-11-29 DIAGNOSIS — R6521 Severe sepsis with septic shock: Secondary | ICD-10-CM | POA: Diagnosis present

## 2022-11-29 DIAGNOSIS — I5043 Acute on chronic combined systolic (congestive) and diastolic (congestive) heart failure: Secondary | ICD-10-CM | POA: Diagnosis not present

## 2022-11-29 DIAGNOSIS — R579 Shock, unspecified: Secondary | ICD-10-CM | POA: Diagnosis not present

## 2022-11-29 DIAGNOSIS — I2722 Pulmonary hypertension due to left heart disease: Secondary | ICD-10-CM | POA: Diagnosis present

## 2022-11-29 DIAGNOSIS — L89152 Pressure ulcer of sacral region, stage 2: Secondary | ICD-10-CM | POA: Diagnosis present

## 2022-11-29 DIAGNOSIS — Z66 Do not resuscitate: Secondary | ICD-10-CM | POA: Diagnosis present

## 2022-11-29 DIAGNOSIS — J15212 Pneumonia due to Methicillin resistant Staphylococcus aureus: Secondary | ICD-10-CM | POA: Diagnosis not present

## 2022-11-29 DIAGNOSIS — M86672 Other chronic osteomyelitis, left ankle and foot: Secondary | ICD-10-CM | POA: Diagnosis present

## 2022-11-29 DIAGNOSIS — E785 Hyperlipidemia, unspecified: Secondary | ICD-10-CM | POA: Diagnosis present

## 2022-11-29 DIAGNOSIS — N179 Acute kidney failure, unspecified: Secondary | ICD-10-CM | POA: Diagnosis not present

## 2022-11-29 DIAGNOSIS — Z8614 Personal history of Methicillin resistant Staphylococcus aureus infection: Secondary | ICD-10-CM | POA: Diagnosis not present

## 2022-11-29 DIAGNOSIS — I5040 Unspecified combined systolic (congestive) and diastolic (congestive) heart failure: Secondary | ICD-10-CM | POA: Diagnosis not present

## 2022-11-29 DIAGNOSIS — L97514 Non-pressure chronic ulcer of other part of right foot with necrosis of bone: Secondary | ICD-10-CM | POA: Diagnosis not present

## 2022-11-29 DIAGNOSIS — E11621 Type 2 diabetes mellitus with foot ulcer: Secondary | ICD-10-CM | POA: Diagnosis present

## 2022-11-29 DIAGNOSIS — I5021 Acute systolic (congestive) heart failure: Secondary | ICD-10-CM | POA: Diagnosis not present

## 2022-11-29 DIAGNOSIS — L89316 Pressure-induced deep tissue damage of right buttock: Secondary | ICD-10-CM | POA: Diagnosis present

## 2022-11-29 DIAGNOSIS — L97424 Non-pressure chronic ulcer of left heel and midfoot with necrosis of bone: Secondary | ICD-10-CM | POA: Diagnosis present

## 2022-11-29 DIAGNOSIS — K761 Chronic passive congestion of liver: Secondary | ICD-10-CM | POA: Diagnosis present

## 2022-11-29 DIAGNOSIS — J90 Pleural effusion, not elsewhere classified: Secondary | ICD-10-CM | POA: Diagnosis not present

## 2022-11-29 DIAGNOSIS — A4102 Sepsis due to Methicillin resistant Staphylococcus aureus: Secondary | ICD-10-CM | POA: Diagnosis present

## 2022-11-29 DIAGNOSIS — G9341 Metabolic encephalopathy: Secondary | ICD-10-CM | POA: Diagnosis present

## 2022-11-29 DIAGNOSIS — D6832 Hemorrhagic disorder due to extrinsic circulating anticoagulants: Secondary | ICD-10-CM | POA: Diagnosis present

## 2022-11-29 LAB — URINALYSIS, W/ REFLEX TO CULTURE (INFECTION SUSPECTED)
Bilirubin Urine: NEGATIVE
Glucose, UA: NEGATIVE mg/dL
Hgb urine dipstick: NEGATIVE
Ketones, ur: NEGATIVE mg/dL
Nitrite: NEGATIVE
Protein, ur: 100 mg/dL — AB
Specific Gravity, Urine: 1.02 (ref 1.005–1.030)
pH: 5 (ref 5.0–8.0)

## 2022-11-29 LAB — COMPREHENSIVE METABOLIC PANEL
ALT: 161 U/L — ABNORMAL HIGH (ref 0–44)
AST: 354 U/L — ABNORMAL HIGH (ref 15–41)
Albumin: 2.6 g/dL — ABNORMAL LOW (ref 3.5–5.0)
Alkaline Phosphatase: 116 U/L (ref 38–126)
Anion gap: 19 — ABNORMAL HIGH (ref 5–15)
BUN: 33 mg/dL — ABNORMAL HIGH (ref 8–23)
CO2: 26 mmol/L (ref 22–32)
Calcium: 8 mg/dL — ABNORMAL LOW (ref 8.9–10.3)
Chloride: 94 mmol/L — ABNORMAL LOW (ref 98–111)
Creatinine, Ser: 2.61 mg/dL — ABNORMAL HIGH (ref 0.61–1.24)
GFR, Estimated: 24 mL/min — ABNORMAL LOW (ref >60)
Glucose, Bld: 133 mg/dL — ABNORMAL HIGH (ref 70–99)
Potassium: 4.1 mmol/L (ref 3.5–5.1)
Sodium: 139 mmol/L (ref 135–145)
Total Bilirubin: 1.2 mg/dL (ref 0.3–1.2)
Total Protein: 6.3 g/dL — ABNORMAL LOW (ref 6.5–8.1)

## 2022-11-29 LAB — CK: Total CK: 384 U/L (ref 49–397)

## 2022-11-29 LAB — APTT: aPTT: 29 seconds (ref 24–36)

## 2022-11-29 LAB — HEMOGLOBIN AND HEMATOCRIT, BLOOD
HCT: 24.9 % — ABNORMAL LOW (ref 39.0–52.0)
Hemoglobin: 7 g/dL — ABNORMAL LOW (ref 13.0–17.0)

## 2022-11-29 LAB — PROTIME-INR
INR: 1.5 — ABNORMAL HIGH (ref 0.8–1.2)
Prothrombin Time: 18.5 seconds — ABNORMAL HIGH (ref 11.4–15.2)

## 2022-11-29 LAB — MRSA NEXT GEN BY PCR, NASAL: MRSA by PCR Next Gen: DETECTED — AB

## 2022-11-29 LAB — LACTIC ACID, PLASMA
Lactic Acid, Venous: 2.8 mmol/L (ref 0.5–1.9)
Lactic Acid, Venous: 1.3 mmol/L (ref 0.5–1.9)

## 2022-11-29 LAB — PREPARE RBC (CROSSMATCH)

## 2022-11-29 MED ORDER — LACTATED RINGERS IV SOLN: INTRAVENOUS | Status: AC

## 2022-11-29 MED ORDER — MIDODRINE HCL 5 MG PO TABS
2.5000 mg | ORAL_TABLET | Freq: Three times a day (TID) | ORAL | Status: DC
  Administered 2022-11-29 – 2022-11-30 (×5): 2.5 mg via ORAL
  Filled 2022-11-29 (×5): qty 1

## 2022-11-29 MED ORDER — SODIUM CHLORIDE 0.9 % IV BOLUS
250.0000 mL | Freq: Once | INTRAVENOUS | Status: AC | PRN
  Administered 2022-11-29: 250 mL via INTRAVENOUS

## 2022-11-29 MED ORDER — CHLORHEXIDINE GLUCONATE CLOTH 2 % EX PADS
6.0000 | MEDICATED_PAD | Freq: Every day | CUTANEOUS | Status: DC
  Administered 2022-11-29 – 2022-11-30 (×2): 6 via TOPICAL

## 2022-11-29 MED ORDER — SODIUM CHLORIDE 0.9% IV SOLUTION: Freq: Once | INTRAVENOUS | Status: AC

## 2022-11-29 NOTE — Hospital Course (Signed)
82 year old male prior history of chronic systolic heart failure EF of 40 to 45%, history of bioprosthetic aortic valve replacement, history of aortic aneurysm, peripheral vascular disease, chronic A-fib on Eliquis, chronic wounds on his bilateral lower extremities, history of anemia, presents to the ER at [facility] yesterday for hypoxia.  He had been seen in wound care clinic and was noted to have a room air O2 saturations of 82%.  He was sent to the ER.   Last month, he was admitted to [facility] where he was diagnosed with MRSA pneumonia and septicemia.  Unclear if he had a transesophageal echocardiogram done.  He was discharged home on IV Cubicin for his septicemia and p.o. doxycycline for his MRSA pneumonia.  Reportedly he had completed his doxycycline for his MRSA pneumonia and had been continued on IV Cubicin.  in the [facility] ER, he had a CT scan which demonstrated a large loculated left-sided pleural effusion. He also had a small right pleural effusion. He also had a cavitary lesion in his right upper lobe that was consistent with his history of MRSA pneumonia. He underwent thoracentesis yielding 900cc of fluid  Pt was transferred to [facility] for formal ID evaluation

## 2022-11-29 NOTE — Consult Note (Signed)
[facility]    Date of Admission:  11/28/2022      Total days of antibiotics PTA Daptomycin from 5/22 discharge   Linezolid 6/11 >> c               Reason for Consult: MRSA bacteremia at OSH    Referring Provider: Rhona [name] Primary Care Provider: Street,  Coup, [name]   Assessment:  [name] [name] is a 82 y.o. male transferred from [facility] with recent history of MRSA bacteremia, presumed to be related to pneumonia vs osteomyelitis of toes. [facility] team / podiatry have maintained him on daptomycin daily at home (unclear the specific dose but described daily injections). He completed 9d of doxycycline as well for PNA. On Monday at wound care he suddenly became dyspneic and found to be hypoxic and referred him back to the ER at [facility]. Found to have a large partially loculated effusion L>R and hypercarbic respiratory failure. He is now S/P thoracentesis from 6/11 at [facility]. Verbally speaking with micro lab, blood cultures taken 6/9 - 6/10 are NGTD and fluid from lung has not had any growth < 24h at this time. On exam he remains lethargic/difficult to arouse, breathing comfortably on 6 LPM [name]. His feet were just re-dressed but his wife took me through the wounds and he has exposed bone on b/l #2 toes awaiting amputations and a larger heel ulcer on the left which WOC team has described to be stage 3 wound.    H/O Recent MRSA Bacteremia  Cavitary Pneumonia -  -Needs more work up - will try to get records from [facility] to review but based on what I am reading and wife's verbal seems his severe infection was "presumed" to be from PNA. Daptomycin does not treat lung parenchyma well which has left this incompletely treated.  -S/P thoracentesis with CT scan 6/11 indicating moderate right sided effusion and cavitary finding c/w necrotic pneumonia from MRSA -Will continue linezolid for now with hope for renal recovery and transition  to vancomycin so we can get adequate penetration to lungs.  -Request for MRSA susceptibities in process  -No concern for TB here with alternative explanation - general contact precautions are all that's necessary   H/O Aortic Valve Replacement (2010) and Thoracic EVAR x 2 -  -He needs a more sensitive test to work up and rule out endocarditis given replaced valve and stents. He is 24yrs post repair. This will help to outline further medical care recs and any potential consideration for surgical intervention depending on findings.  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 Now with stage 3 ulcer on left heel.  -They have requested ortho to see him while here - Dr. Lajoyce [name] to evaluate  -May need MRI of left heel for further work up - will see what Dr. Lajoyce [name] thinks; he does have new onset left heel pain and normally insensate  H/O B/L TKRs -  -Not currently causing problems and exam benign.    AKI -  -baseline creatinine seems to be about 1.2 --> Creatinine now seems to be trending upward, today 2.61. This should not be due to previous Vancomycin given >3 weeks ago.  -Check CK given exposure to Daptomycin  -Discussed trial of vancomycin however will see what kidney trend looks like over the  next 48h before we add another confounding variable  -May consider kidney u/s to look for renal infarction     Encephalopathy -  -if this continues would have low threshold to get brain MRI to look for CNS involvement with recent MRSA bacteremia and left sided valve replacement as a surrogate finding for possible PVE   PICC in Place -  -Not bacteremic preliminarily from 6/09 blood cultures. Will continue to check with [name] [name] for updates.   -Would like to get more information about records from recent hospital stay to see if we can troubleshoot timing of PICC  Placement vs clearance of bacteremia.  -Dressing change due    Plan: Follow blood cultures at [facility] from 6/9-6/10 (NGTD at the time of note) CK now Contact Precautions Continue linezolid for now IV Follow kidney function - consider ultrasound to assess for possible emboli/infarction  If remains slow to wake/encephalopathic would have low threshold to order brain MRI to assess CNS emboli  TEE (cards consulted) WOC care recs for leg wounds Ortho for eval of b/l Feet wounds and surgical management/recs Can keep current PICC for now    Principal Problem:   Loculated pleural effusion Active Problems:   Normocytic anemia   S/P AVR (aortic valve replacement)   Chronic systolic CHF (congestive heart failure) (HCC)   Atrial fibrillation (HCC)   Acute respiratory failure with hypoxia (HCC)   Chronic wound   MRSA bacteremia    amiodarone  200 mg Oral Daily   Chlorhexidine Gluconate Cloth  6 each Topical Daily   furosemide  40 mg Oral Daily   heparin  5,000 Units Subcutaneous Q8H   loratadine  10 mg Oral Daily   metoprolol succinate  25 mg Oral Daily   pantoprazole  40 mg Oral Daily   tamsulosin  0.4 mg Oral Daily    HPI: [name] [name] is a 82 y.o. male transferred [facility].   Mr. Manglicmot is sleepy and not able to participate much in conversation due to lethargy and no hearing aids in place. History gathered from daughter, [name] [name] and his wife along with chart review. He has been able to eat a little today periodically and will wake briefly but not really interacting much with family. They feel like it may be sundowners vs being quite tired from staying up the majority of the last few days. He has been in the ER since Monday AM and transferred to [facility].   In discussion, he was at [facility] admitted for about 1.5 weeks with discharge May 22nd for MRSA bacteremia thought to be due to pneumonia.  He also has osteomyelitis of bilateral #2 toes with  exposed bone and awaiting amputation for these (which was planned today by Dr. Marylene Land, podiatry). She has maintained his IV daptomycin through planned 6/19. He completed 9d doxycycline from MRSA pneumonia. His wife tells me that he may have had a surface echo at [facility] during this hospital stay.   He was doing relatively well up until Monday when he went to wound care when he had sudden SOB/dyspnea and found to be hypoxic and sent to ER. He was hypercarbic and placed on BiPAP; a large L>R pleural effusion and had 900 cc drained from thoracentesis 6/11 - improved significantly. Now on [place] 6 LPM and breathing comfortably but still not staying awake long and difficult to arouse.  Has had some decrease in kidney function - His wife said they gave him vancomycin the first  time in May during admission but his kidney function decreased on it and they stopped it. He is now on linezolid IV.   Records reviewed that are available from [facility] - he did have documented cleared blood prior to discharge verbally (awaiting details from Micro). He does have PICC in place at present that is functioning well and due for dressing change today. His wife who is a retired rad tech has had no trouble or concern administering his IV antibiotics at home.   He has  h/o b/l TKRs -- no problem with these at all but has been describing pain in the left heel where he has a new ulceration formed.    Review of Systems: ROS - encephalopathic    Past Medical History:  Diagnosis Date   Acute on chronic diastolic heart failure (HCC) 08/22/2013   Acute on chronic respiratory failure with hypoxia (HCC) 10/03/2019   Acute on chronic systolic heart failure (HCC) 05/11/2013   EF from 35% to 50-55% on 6/14 ECHO.    Acute respiratory failure with hypoxia (HCC) 10/07/2013   AKI (acute kidney injury) (HCC) 12/17/2019   Aortic atherosclerosis (HCC) 05/05/2013   Aortic dissection (HCC) 05/04/2013    Descending only, had aneurysm of ascending  but no dissection   02/12/2014 Stable aortic stent graft over the aortic arch and descending thoracic aorta with significant reduction in the mural thrombus of the native aneurysm sac. No evidence of dissection or endoleak.  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 No evidence of abdominal aortic dissection.  Occluded proximal left subclav   Aortic valve insufficiency    Arthritis    "probably in my knees before they replaced them" (01/28/2015)   Atrial fibrillation (HCC) 05/21/2013   Atrial flutter (HCC)    Bradycardia 08/23/2013   CAD (coronary artery disease)    Chest pain 01/28/2015   CHF (congestive heart failure) (HCC)    Chronic diastolic heart failure (HCC) 01/28/2015   Descending aortic aneurysm (HCC)    DVT (deep venous thrombosis) (HCC)    ?LLE post knee surgery   Dyspnea 09/2019   HCAP (healthcare-associated pneumonia) 10/11/2013   History of blood transfusion    "after one of my knee surgeries"   HTN (hypertension)    Hyperlipidemia    Hypotension 12/17/2019   Incarcerated left inguinal hernia s/p repair 12/02/2019 12/01/2019   Insomnia 09/04/2013   Multiple fractures of ribs, right side, init for clos fx 09/04/2019   Near syncope 01/28/2015   Pressure injury of skin 10/06/2019   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 12/18/2019 12/17/2019   Recurrent UTI 10/06/2013   Shock circulatory (HCC) 10/07/2013   Thoracic aneurysm    Tobacco abuse    Urinary retention 10/06/2013   UTI (urinary tract infection) 10/06/2013    Social History   Tobacco Use   Smoking status: Former    Packs/day: 0    Types: Cigarettes    Quit date: 04/16/1974    Years since quitting: 48.6   Smokeless tobacco: Never  Vaping Use   Vaping Use: Never used  Substance Use Topics   Alcohol use: No   Drug use: No    Family History  Problem Relation Age of Onset   Celiac disease Daughter    Aortic aneurysm Father    Hypertension Mother    Heart attack Neg  Hx    Stroke Neg Hx    Allergies  Allergen Reactions   Lortab [Hydrocodone-Acetaminophen] Hives  No trouble breathing   Morphine And Codeine Hives   Other Other (See Comments)    Staples from surgery caused infection    OBJECTIVE: Blood pressure (!) 88/41, pulse 91, temperature 98.1 F (36.7 C), temperature source Oral, resp. rate 16, weight 86.6 kg, SpO2 99 %.  Physical Exam Constitutional:      Comments: Lethargic, wakes with effort but then drifts off to sleep again   HENT:     Mouth/Throat:     Mouth: Mucous membranes are dry.  Eyes:     Conjunctiva/sclera: Conjunctivae normal.  Cardiovascular:     Rate and Rhythm: Normal rate.  Pulmonary:     Effort: Pulmonary effort is normal.     Breath sounds: Normal breath sounds.  Abdominal:     General: Bowel sounds are normal. There is no distension.  Musculoskeletal:        General: No swelling.  Skin:    General: Skin is warm and dry.     Comments: No peripheral stigmata on hands/feet that resemble endocarditis Feet wrapped b/l in dry ace wraps      Lab Results Lab Results  Component Value Date   WBC 5.3 11/28/2022   HGB 7.9 (L) 11/28/2022   HCT 27.8 (L) 11/28/2022   MCV 102.6 (H) 11/28/2022   PLT 261 11/28/2022    Lab Results  Component Value Date   CREATININE 2.61 (H) 11/29/2022   BUN 33 (H) 11/29/2022   NA 139 11/29/2022   K 4.1 11/29/2022   CL 94 (L) 11/29/2022   CO2 26 11/29/2022    Lab Results  Component Value Date   ALT 161 (H) 11/29/2022   AST 354 (H) 11/29/2022   ALKPHOS 116 11/29/2022   BILITOT 1.2 11/29/2022     Microbiology: Recent Results (from the past 240 hour(s))  MRSA Next Gen by PCR, Nasal     Status: Abnormal   Collection Time: 11/29/22  5:06 AM   Specimen: Nasal Mucosa; Nasal Swab  Result Value Ref Range Status   MRSA by PCR Next Gen DETECTED (A) NOT DETECTED Final    Comment: RESULT CALLED TO, READ BACK BY AND VERIFIED WITH: B HYDLEBURG,RN@0700  11/29/22 MK (NOTE) The  GeneXpert MRSA Assay (FDA approved for NASAL specimens only), is one component of a comprehensive MRSA colonization surveillance program. It is not intended to diagnose MRSA infection nor to guide or monitor treatment for MRSA infections. Test performance is not FDA approved in patients less than 35 years old. Performed at [facility], [address], [place], [place] [address]     Rexene Alberts, MSN, NP-C [facility] for Infectious Disease [facility] Pager: [phone]  11/29/2022 10:24 AM

## 2022-11-29 NOTE — Progress Notes (Signed)
    Patient Name: [name] [name]           DOB: 04-27-1941  MRN: [id number]      Admission Date: 11/28/2022  Attending Provider: Jerald Kief, MD  Primary Diagnosis: Loculated pleural effusion   Level of care: Telemetry Medical    CROSS COVER NOTE   Date of Service   11/29/2022   [name] [name], 82 y.o. male, was admitted on 11/28/2022 for Loculated pleural effusion.    HPI/Events of Note   Normocytic anemia Hemoglobin 7.9--> 7.  Baseline hemoglobin ~ 10.  No acute changes reported.  BP remains soft, 93/58 (67).  Patient had episodes of asymptomatic hypotension for which he has received IV fluids and low-dose midodrine. No melena, hematochezia, or other bleeding reported tonight.  Given drop in hemoglobin and soft BP, will transfuse 1 unit PRBC.  Continue monitoring hemoglobin and for signs of bleeding.   Interventions/ Plan   Blood transfusion, 1 unit PRBC  Occult blood, stool        Anthoney Harada, DNP, [employer] [facility] [place]

## 2022-11-29 NOTE — TOC Initial Note (Signed)
Transition of Care [facility]) - Initial/Assessment Note    Patient Details  Name: [name] [name] MRN: [id number] Date of Birth: January 08, 1941  [facility] CM/SW Contact:    Lawerance Sabal, RN Phone Number: 11/29/2022, 12:04 PM  Clinical Narrative:                  Confirmed with Jeri Modena RN that patient is active through 6/19 with [facility] prior to admission.  Patient has PICC line in place on admission.  From home w spouse.   Expected Discharge Plan: Home w Home Health Services Barriers to Discharge: Continued Medical Work up   Patient Goals and CMS Choice            Expected Discharge Plan and Services   Discharge Planning Services: CM Consult   Living arrangements for the past 2 months: Single Family Home                                      Prior Living Arrangements/Services Living arrangements for the past 2 months: Single Family Home Lives with:: Spouse                   Activities of Daily Living Home Assistive Devices/Equipment: None ADL Screening (condition at time of admission) Patient's cognitive ability adequate to safely complete daily activities?: No Is the patient deaf or have difficulty hearing?: Yes Does the patient have difficulty seeing, even when wearing glasses/contacts?: No Does the patient have difficulty concentrating, remembering, or making decisions?: Yes Patient able to express need for assistance with ADLs?: Yes Does the patient have difficulty dressing or bathing?: Yes Independently performs ADLs?: No Communication: Independent Dressing (OT): Needs assistance Is this a change from baseline?: Pre-admission baseline Grooming: Needs assistance Is this a change from baseline?: Pre-admission baseline Feeding: Independent Bathing: Needs assistance Is this a change from baseline?: Pre-admission baseline Toileting: Needs assistance Is this a change from baseline?: Pre-admission baseline In/Out  Bed: Needs assistance Is this a change from baseline?: Pre-admission baseline Walks in Home: Needs assistance Is this a change from baseline?: Pre-admission baseline Does the patient have difficulty walking or climbing stairs?: Yes Weakness of Legs: Both Weakness of Arms/Hands: Both  Permission Sought/Granted                  Emotional Assessment              Admission diagnosis:  Loculated pleural effusion [J90] Patient Active Problem List   Diagnosis Date Noted   Loculated pleural effusion 11/28/2022   Chronic wound 11/28/2022   MRSA bacteremia 11/28/2022   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 12/18/2019 12/17/2019   Incarcerated left inguinal hernia s/p repair 12/02/2019 12/01/2019   Pressure injury of skin 10/06/2019   Chronic diastolic heart failure (HCC) 01/28/2015   Acute respiratory failure with hypoxia (HCC) 10/07/2013   Recurrent UTI 10/06/2013   Insomnia 09/04/2013   Bradycardia 08/23/2013   Atrial fibrillation (HCC) 05/21/2013   Atrial flutter (HCC)    Chronic systolic CHF (congestive heart failure) (HCC) 05/11/2013   Aortic atherosclerosis (HCC) 05/05/2013   Normocytic anemia 05/04/2013   CAD (coronary artery disease)    S/P AVR (aortic valve replacement)    Hyperlipidemia    HTN (hypertension)    PCP:  Street, [name], MD Pharmacy:  [facility], [place] - [address] [facility] [place] [address] Phone: [phone] Fax: [phone]     Social Determinants of Health (SDOH) Social History: SDOH Screenings   Food Insecurity: Patient Unable To Answer (11/28/2022)  Housing: Patient Unable To Answer (11/28/2022)  Transportation Needs: Patient Unable To Answer (11/28/2022)  Utilities: Not At Risk (11/28/2022)  Depression (PHQ2-9): Low Risk  (04/13/2019)  Tobacco Use: Medium Risk (11/29/2022)   SDOH  Interventions:     Readmission Risk Interventions     No data to display

## 2022-11-29 NOTE — Progress Notes (Signed)
Progress Note   Patient: [name] [name]:[id number] DOB: [birth year] DOA: 11/28/2022     1 DOS: the patient was seen and examined on 11/29/2022   Brief hospital course: 82 year old male prior history of chronic systolic heart failure EF of 40 to 45%, history of bioprosthetic aortic valve replacement, history of aortic aneurysm, peripheral vascular disease, chronic A-fib on Eliquis, chronic wounds on his bilateral lower extremities, history of anemia, presents to the ER at [facility] yesterday for hypoxia.  He had been seen in wound care clinic and was noted to have a room air O2 saturations of 82%.  He was sent to the ER.   Last month, he was admitted to [facility] where he was diagnosed with MRSA pneumonia and septicemia.  Unclear if he had a transesophageal echocardiogram done.  He was discharged home on IV Cubicin for his septicemia and p.o. doxycycline for his MRSA pneumonia.  Reportedly he had completed his doxycycline for his MRSA pneumonia and had been continued on IV Cubicin.  in the [facility] ER, he had a CT scan which demonstrated a large loculated left-sided pleural effusion. He also had a small right pleural effusion. He also had a cavitary lesion in his right upper lobe that was consistent with his history of MRSA pneumonia. He underwent thoracentesis yielding 900cc of fluid  Pt was transferred to [facility] for formal ID evaluation  Assessment and Plan: Loculated pleural effusion with cavitary PNA -RH documents 900 ml of pleural fluid removed from left side thoracentesis.  -Repeat CT chest reviewed. Moderate R layering effusion and small small residual L effusion with multifocal necrotizing PNA -ID consulted. Recs to continue zyvox for now with hope for renal recovery and transition to vanc for better lung penetration   Acute respiratory failure with hypoxia (HCC) Secondary to his pleural effusion and cavitary PNA -Cont o2 and wean as tolerated   R heel stage  3 pressure ulcer, present on admit -reportedly had been planned for outpatient bilateral 2nd toe amputations at [facility] which had been canceled as pt is now in hospital.  -Have reached out to [facility]   Atrial fibrillation (HCC) [name] eliquis had been on hold in anticipation for planned amputation  -toprolol XL reordered, but held secondary to hypotension   Chronic systolic CHF (congestive heart failure) (HCC) Elevated pro-BNP at [facility] to 9870. Presenting BNP 1,270 -no LE edema. No evidence of pulm edema on chest CT -lasix dose held secondary to hypotension   S/P AVR (aortic valve replacement) Stable.   MRSA bacteremia -Pt has been on IV cubicin at home for MRSA septicemia.  -Reportedly pt had developed renal insufficiency when on IV vancomycin. Had been on po doxycycline at home and reportedly had completed this course. -Currently on zyvox   Normocytic anemia -Without obvious evidence of bleeding at this tiem -Hgb trends had been going dowin. Will check repeat hgb  Hypotension -Pt appears asymptomatic  -Seems fluid responsive -Hold BP meds and diuretics -Started low dose midodrine -Giving gentle IVF x 8hrs -Will check h/h given down trending hgb  ARF -Cr of 1.2 on 06/23/22. Cr up to 2.61 on 6/12 -Will hold diuretic -give gentle IVF x 8hrs      Subjective: Without complaints at this time  Physical Exam: Vitals:   11/29/22 0855 11/29/22 1200 11/29/22 1538 11/29/22 1617  BP: (!) 88/41 (!) 88/54 (!) 90/53 (!) 96/57  Pulse:  83 86 95  Resp:    16  Temp:   (!) 97  F (36.1 C) 98.3 F (36.8 C)  TempSrc:   Oral Oral  SpO2:   100% 100%  Weight:       General exam: Awake, laying in bed, in nad Respiratory system: Normal respiratory effort, no wheezing Cardiovascular system: regular rate, s1, s2 Gastrointestinal system: Soft, nondistended, positive BS Central nervous system: CN2-12 grossly intact, strength intact Extremities: Perfused, no clubbing Skin: Normal skin turgor, no  notable skin lesions seen Psychiatry: Mood normal // no visual hallucinations   Data Reviewed:  Labs reviewed: Na 139, K 4.1, Cr 2.61,   Family Communication: Pt in room, family not at bedside  Disposition: Status is: Observation The patient will require care spanning > 2 midnights and should be moved to inpatient because: Severity of illness  Planned Discharge Destination:  Unclear at this time     Author: Rickey Barbara, MD 11/29/2022 5:28 PM  For on call review [email or web address].

## 2022-11-29 NOTE — Progress Notes (Signed)
Pts hgb 7. Provider paged. Consent completed with wife and two nurses via telephone. 1 U PRBC ordered/transfused. Awaiting repeat H&H.

## 2022-11-29 NOTE — Consult Note (Signed)
WOC Nurse Consult Note: Reason for Consult:LE wounds Patient is followed by podiatry in [place] and was scheduled for bilateral 2nd metatarsal amputation today. 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  Pressure Injury POA: Yes Measurement: Less than 1cm; no depth 1cm x 1cm x 0.1cm; dry Right toe; 1cm x 1cm x 0.1cm with bone protruding Left toe: 2cm x 2cm x 0cm; yellow firm wound bed  Wound bed:see above  Drainage (amount, consistency, odor) none Periwound: intact  Dressing procedure/placement/frequency: Wife provides meticulous care for the patients LE wounds for a number of years.  She watched me perform wound care today Paint all areas with betadine swab daily, cover with dry dressings. Secure with ACE wraps. Change daily. Prevalon boots to offload heels while in the bed.   Discussed POC with patient and bedside nurse.  Re consult if needed, will not follow at this time. Thanks  Kaylenn Civil [employer], RN,CWOCN, CNS, CWON-AP [phone])

## 2022-11-29 NOTE — Sepsis Progress Note (Signed)
[facility] will follow per sepsis protocol  

## 2022-11-29 NOTE — Significant Event (Addendum)
Rapid Response Event Note   Reason for Call :  Hypotension sustained overnight  Initial Focused Assessment:  Patient a new admission from [facility] overnight, has remained hypotensive SBP 80s asymptomatic. A&Ox4, alert to voice. Lungs diminished, heart tones normal. Skin warm and dry. Minimal edema noted.   85/44 (55) HR 94 RR 21 O2 94% 6L [place]  Interventions:  NS bolus Sepsis protocol ordered Trend vitals   Plan of Care:  Hold lasix. Monitor patient response post bolus, possible transfer to PCU.  Event Summary:  MD Notified: Johnna Acosta Call Time: 1610 Arrival Time: 0815 End Time: 0830  Truddie Crumble, RN

## 2022-11-30 ENCOUNTER — Inpatient Hospital Stay (HOSPITAL_COMMUNITY): Payer: Medicare Other

## 2022-11-30 ENCOUNTER — Encounter (HOSPITAL_COMMUNITY): Payer: Self-pay | Admitting: Internal Medicine

## 2022-11-30 DIAGNOSIS — Z952 Presence of prosthetic heart valve: Secondary | ICD-10-CM | POA: Diagnosis not present

## 2022-11-30 DIAGNOSIS — L0390 Cellulitis, unspecified: Secondary | ICD-10-CM

## 2022-11-30 DIAGNOSIS — L97524 Non-pressure chronic ulcer of other part of left foot with necrosis of bone: Secondary | ICD-10-CM

## 2022-11-30 DIAGNOSIS — L97514 Non-pressure chronic ulcer of other part of right foot with necrosis of bone: Secondary | ICD-10-CM

## 2022-11-30 DIAGNOSIS — I4821 Permanent atrial fibrillation: Secondary | ICD-10-CM | POA: Diagnosis not present

## 2022-11-30 DIAGNOSIS — R579 Shock, unspecified: Secondary | ICD-10-CM

## 2022-11-30 DIAGNOSIS — G9341 Metabolic encephalopathy: Secondary | ICD-10-CM | POA: Diagnosis not present

## 2022-11-30 DIAGNOSIS — J9601 Acute respiratory failure with hypoxia: Secondary | ICD-10-CM | POA: Diagnosis not present

## 2022-11-30 DIAGNOSIS — I70229 Atherosclerosis of native arteries of extremities with rest pain, unspecified extremity: Secondary | ICD-10-CM | POA: Diagnosis not present

## 2022-11-30 DIAGNOSIS — N179 Acute kidney failure, unspecified: Secondary | ICD-10-CM | POA: Diagnosis not present

## 2022-11-30 DIAGNOSIS — J90 Pleural effusion, not elsewhere classified: Secondary | ICD-10-CM | POA: Diagnosis not present

## 2022-11-30 DIAGNOSIS — I5022 Chronic systolic (congestive) heart failure: Secondary | ICD-10-CM | POA: Diagnosis not present

## 2022-11-30 LAB — COMPREHENSIVE METABOLIC PANEL
ALT: 113 U/L — ABNORMAL HIGH (ref 0–44)
AST: 156 U/L — ABNORMAL HIGH (ref 15–41)
Albumin: 2.2 g/dL — ABNORMAL LOW (ref 3.5–5.0)
Alkaline Phosphatase: 94 U/L (ref 38–126)
Anion gap: 15 (ref 5–15)
BUN: 38 mg/dL — ABNORMAL HIGH (ref 8–23)
CO2: 29 mmol/L (ref 22–32)
Calcium: 7.7 mg/dL — ABNORMAL LOW (ref 8.9–10.3)
Chloride: 95 mmol/L — ABNORMAL LOW (ref 98–111)
Creatinine, Ser: 2.98 mg/dL — ABNORMAL HIGH (ref 0.61–1.24)
GFR, Estimated: 20 mL/min — ABNORMAL LOW (ref >60)
Glucose, Bld: 102 mg/dL — ABNORMAL HIGH (ref 70–99)
Potassium: 3.7 mmol/L (ref 3.5–5.1)
Sodium: 139 mmol/L (ref 135–145)
Total Bilirubin: 1.1 mg/dL (ref 0.3–1.2)
Total Protein: 5.5 g/dL — ABNORMAL LOW (ref 6.5–8.1)

## 2022-11-30 LAB — HEMOGLOBIN AND HEMATOCRIT, BLOOD
HCT: 27.1 % — ABNORMAL LOW (ref 39.0–52.0)
Hemoglobin: 7.9 g/dL — ABNORMAL LOW (ref 13.0–17.0)

## 2022-11-30 LAB — TYPE AND SCREEN
ABO/RH(D): A POS
Antibody Screen: NEGATIVE
Unit division: 0

## 2022-11-30 LAB — BLOOD GAS, ARTERIAL
Acid-Base Excess: 2 mmol/L (ref 0.0–2.0)
Acid-Base Excess: 0.9 mmol/L (ref 0.0–2.0)
Bicarbonate: 30.3 mmol/L — ABNORMAL HIGH (ref 20.0–28.0)
Bicarbonate: 28.9 mmol/L — ABNORMAL HIGH (ref 20.0–28.0)
Drawn by: 5259
O2 Saturation: 99.1 %
Patient temperature: 36.6
Patient temperature: 37
pCO2 arterial: 62 mmHg — ABNORMAL HIGH (ref 32–48)
pCO2 arterial: 60 mmHg — ABNORMAL HIGH (ref 32–48)
pH, Arterial: 7.3 — ABNORMAL LOW (ref 7.35–7.45)
pH, Arterial: 7.29 — ABNORMAL LOW (ref 7.35–7.45)
pO2, Arterial: 89 mmHg (ref 83–108)
pO2, Arterial: 85 mmHg (ref 83–108)

## 2022-11-30 LAB — VAS US ABI WITH/WO TBI: Left ABI: 0.57

## 2022-11-30 LAB — CBC
HCT: 27.7 % — ABNORMAL LOW (ref 39.0–52.0)
Hemoglobin: 8 g/dL — ABNORMAL LOW (ref 13.0–17.0)
MCH: 28.9 pg (ref 26.0–34.0)
MCHC: 28.9 g/dL — ABNORMAL LOW (ref 30.0–36.0)
MCV: 100 fL (ref 80.0–100.0)
Platelets: 255 10*3/uL (ref 150–400)
RBC: 2.77 MIL/uL — ABNORMAL LOW (ref 4.22–5.81)
RDW: 18.6 % — ABNORMAL HIGH (ref 11.5–15.5)
WBC: 5.8 10*3/uL (ref 4.0–10.5)
nRBC: 0.3 % — ABNORMAL HIGH (ref 0.0–0.2)

## 2022-11-30 LAB — BPAM RBC
Blood Product Expiration Date: 202406282359
ISSUE DATE / TIME: 202406122154
Unit Type and Rh: 6200

## 2022-11-30 LAB — FOLATE: Folate: >40 ng/mL (ref >5.9)

## 2022-11-30 LAB — VITAMIN B12: Vitamin B-12: 1409 pg/mL — ABNORMAL HIGH (ref 180–914)

## 2022-11-30 LAB — IRON AND TIBC
Iron: 41 ug/dL — ABNORMAL LOW (ref 45–182)
Saturation Ratios: 22 % (ref 17.9–39.5)
TIBC: 183 ug/dL — ABNORMAL LOW (ref 250–450)
UIBC: 142 ug/dL

## 2022-11-30 LAB — URINE CULTURE: Culture: NO GROWTH

## 2022-11-30 LAB — PROCALCITONIN: Procalcitonin: 2.18 ng/mL

## 2022-11-30 MED ORDER — MIDODRINE HCL 5 MG PO TABS
10.0000 mg | ORAL_TABLET | Freq: Three times a day (TID) | ORAL | Status: DC
  Administered 2022-12-01 – 2022-12-08 (×23): 10 mg via ORAL
  Filled 2022-11-30 (×23): qty 2

## 2022-11-30 MED ORDER — LACTATED RINGERS IV SOLN: INTRAVENOUS | Status: DC

## 2022-11-30 MED ORDER — MIDODRINE HCL 5 MG PO TABS
20.0000 mg | ORAL_TABLET | ORAL | Status: AC
  Administered 2022-11-30: 20 mg via ORAL
  Filled 2022-11-30: qty 4

## 2022-11-30 NOTE — Consult Note (Signed)
ORTHOPAEDIC CONSULTATION  REQUESTING PHYSICIAN: Jerald Kief, MD  Chief Complaint: Ischemic ulcers of both feet.  HPI: [name] [name] is a 82 y.o. male who presents with multiple medical problems including chronic ulceration both feet.  By report patient was scheduled for bilateral second toe amputation prior to admission.  Past Medical History:  Diagnosis Date   Acute on chronic diastolic heart failure (HCC) 08/22/2013   Acute on chronic respiratory failure with hypoxia (HCC) 10/03/2019   Acute on chronic systolic heart failure (HCC) 05/11/2013   EF from 35% to 50-55% on 6/14 ECHO.    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 No evidence of dissection or endoleak.  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 No evidence of abdominal aortic dissection.  Occluded proximal left subclav   Aortic valve insufficiency    Arthritis    "probably in my knees before they replaced them" (01/28/2015)   Atrial fibrillation (HCC) 05/21/2013   Atrial flutter (HCC)    Bradycardia 08/23/2013   CAD (coronary artery disease)    Chest pain 01/28/2015   CHF (congestive heart failure) (HCC)    Chronic diastolic heart failure (HCC) 01/28/2015   Descending aortic aneurysm (HCC)    DVT (deep venous thrombosis) (HCC)    ?LLE post knee surgery   Dyspnea 09/2019   HCAP (healthcare-associated pneumonia) 10/11/2013   History of blood transfusion    "after one of my knee surgeries"   HTN (hypertension)    Hyperlipidemia    Hypotension 12/17/2019   Incarcerated left inguinal hernia s/p repair 12/02/2019 12/01/2019   Insomnia 09/04/2013    Multiple fractures of ribs, right side, init for clos fx 09/04/2019   Near syncope 01/28/2015   Pressure injury of skin 10/06/2019   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 12/18/2019 12/17/2019   Recurrent UTI 10/06/2013   Shock circulatory (HCC) 10/07/2013   Thoracic aneurysm    Tobacco abuse    Urinary retention 10/06/2013   UTI (urinary tract infection) 10/06/2013   Past Surgical History:  Procedure Laterality Date   AORTIC VALVE REPLACEMENT  02/19/2009   AORTO-FEMORAL BYPASS GRAFT  03/2010   ascending aortic and arch aneurysm repair/notes 04/09/2009   CARDIAC VALVE REPLACEMENT     CAROTID-SUBCLAVIAN BYPASS GRAFT Left 08/26/2013   Procedure: BYPASS GRAFT CAROTID-SUBCLAVIAN;  Surgeon: Nada Libman, MD;  Location: [facility] OR;  Service: Vascular;  Laterality: Left;   CATARACT EXTRACTION W/ INTRAOCULAR LENS  IMPLANT, BILATERAL Bilateral    ENDOVASCULAR STENT INSERTION N/A 08/26/2013   Procedure:  THORACIC STENT GRAFT INSERTION;  Surgeon: Nada Libman, MD;  Location: [facility] OR;  Service: Vascular;  Laterality: N/A;   EYE SURGERY     INGUINAL HERNIA REPAIR Left 12/02/2019   Procedure: REPAIR LEFT INGUINAL HERNIA WITH MESH;  Surgeon: Violeta Gelinas, MD;  Location: [facility] OR;  Service: General;  Laterality: Left;   INGUINAL HERNIA REPAIR Left 12/18/2019   Procedure: LAPAROSCOPIC ASSISTED REPAIR OF RECURRENT INCARCERATED LEFT INGUINAL HERNIA WITH MESH; LAPAOSCOPIC REPAIR OF SEROSAL TEAR;  Surgeon: Gaynelle Adu, MD;  Location: [facility] OR;  Service: General;  Laterality: Left;   INSERTION OF  MESH Left 12/02/2019   Procedure: INSERTION OF MESH;  Surgeon: Violeta Gelinas, MD;  Location: [facility] OR;  Service: General;  Laterality: Left;   JOINT REPLACEMENT     KNEE ARTHROSCOPY Bilateral    REPLACEMENT TOTAL KNEE Bilateral    right groin lymphocele Right 03/29/2009   RIGHT/LEFT HEART CATH AND CORONARY ANGIOGRAPHY N/A 10/06/2019   Procedure: RIGHT/LEFT HEART CATH AND CORONARY ANGIOGRAPHY;   Surgeon: Lyn Records, MD;  Location: [facility] INVASIVE CV LAB;  Service: Cardiovascular;  Laterality: N/A;   THORACENTESIS  2010 X 2   TONSILLECTOMY     Social History   Socioeconomic History   Marital status: Married    Spouse name: Not on file   Number of children: Not on file   Years of education: Not on file   Highest education level: Not on file  Occupational History   Not on file  Tobacco Use   Smoking status: Former    Packs/day: 0    Types: Cigarettes    Quit date: 04/16/1974    Years since quitting: 48.6   Smokeless tobacco: Never  Vaping Use   Vaping Use: Never used  Substance and Sexual Activity   Alcohol use: No   Drug use: No   Sexual activity: Not Currently  Other Topics Concern   Not on file  Social History Narrative   Not on file   Social Determinants of Health   Financial Resource Strain: Not on file  Food Insecurity: Patient Unable To Answer (11/28/2022)   Hunger Vital Sign    Worried About Running Out of Food in the Last Year: Patient unable to answer    Ran Out of Food in the Last Year: Patient unable to answer  Transportation Needs: Patient Unable To Answer (11/28/2022)   PRAPARE - Administrator, Civil Service (Medical): Patient unable to answer    Lack of Transportation (Non-Medical): Patient unable to answer  Physical Activity: Not on file  Stress: Not on file  Social Connections: Not on file   Family History  Problem Relation Age of Onset   Celiac disease Daughter    Aortic aneurysm Father    Hypertension Mother    Heart attack Neg Hx    Stroke Neg Hx    - negative except otherwise stated in the family history section Allergies  Allergen Reactions   Lortab [Hydrocodone-Acetaminophen] Hives    No trouble breathing   Morphine And Codeine Hives   Other Other (See Comments)    Staples from surgery caused infection   Prior to Admission medications   Medication Sig Start Date End Date Taking? Authorizing Provider   acetaminophen (TYLENOL) 500 MG tablet Take 2 tablets (1,000 mg total) by mouth every 6 (six) hours as needed. Patient taking differently: Take 1,000 mg by mouth every 6 (six) hours as needed for mild pain or moderate pain. 09/08/19  Yes Barnetta Chapel, PA-C  albuterol (PROVENTIL) (2.5 MG/3ML) 0.083% nebulizer solution Take by nebulization. 06/22/22  Yes [provider]  amiodarone (PACERONE) 200 MG tablet Take 1 tablet (200 mg total) by mouth daily. 07/12/22  Yes [name], MD  amoxicillin (AMOXIL) 500 MG capsule Take 4 capsules by mouth 30-60 min prior to dental procedure. Patient taking differently: Take 1,000 mg by mouth See admin instructions. Take 4 capsules by mouth 30-60 min prior to dental procedures 03/15/20  Yes [name], MD  apixaban (ELIQUIS) 2.5 MG TABS tablet Take 1 tablet (2.5 mg total) by mouth 2 (  two) times daily. 11/15/22  Yes [name], MD  DAPTOmycin (CUBICIN) 500 MG injection Inject 500 mg into the vein daily. 11/28/22  Yes [provider]  ferrous sulfate (SV IRON) 325 (65 FE) MG tablet Take 1 tablet (325 mg total) by mouth daily with breakfast. 03/17/22  Yes [name], MD  furosemide (LASIX) 40 MG tablet Take 1 tablet (40 mg total) by mouth 2 (two) times daily as needed. Patient taking differently: Take 40 mg by mouth daily. 03/17/22  Yes [name], MD  lansoprazole (PREVACID) 30 MG capsule Take 30 mg by mouth daily. 08/13/19  Yes [provider]  levocetirizine (XYZAL) 5 MG tablet Take 5 mg by mouth daily. 04/21/21  Yes [provider]  metoprolol succinate (TOPROL-XL) 25 MG 24 hr tablet Take 25 mg by mouth daily.   Yes [provider]  Multiple Vitamin (MULTIVITAMIN WITH MINERALS) TABS tablet Take 1 tablet by mouth daily.   Yes [provider]  Multiple Vitamins-Minerals (ZINC PO) Take 1 tablet by mouth daily at 6 (six) AM.   Yes [provider]  tamsulosin (FLOMAX)  0.4 MG CAPS capsule Take 0.4 mg by mouth.   Yes [provider]  traMADol (ULTRAM) 50 MG tablet Take 50 mg by mouth every 6 (six) hours as needed for moderate pain. 11/27/22  Yes [provider]   CT CHEST WO CONTRAST  Result Date: 11/29/2022 CLINICAL DATA:  Pneumonia.  Left thoracentesis 11/28/2022. EXAM: CT CHEST WITHOUT CONTRAST TECHNIQUE: Multidetector CT imaging of the chest was performed following the standard protocol without IV contrast. RADIATION DOSE REDUCTION: This exam was performed according to the departmental dose-optimization program which includes automated exposure control, adjustment of the mA and/or kV according to patient size and/or use of iterative reconstruction technique. COMPARISON:  11/27/2022. FINDINGS: Cardiovascular: Status post aortic valve replacement and tube graft repair of the ascending aorta as well as left carotid-subclavian bypass grafting. Endovascular stent grafting of the aortic arch and descending thoracic aorta is again noted with the proximal portion of the stent graft covering the left subclavian artery origin. 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 Extensive multi-vessel coronary artery calcification. Stable cardiomegaly. No pericardial effusion. Central pulmonary arteries are enlarged in keeping with changes of pulmonary arterial hypertension. Right upper extremity PICC line tip is seen at the superior cavoatrial junction. Mediastinum/Nodes: Visualized thyroid is unremarkable. Pathologic mediastinal adenopathy appears slightly progressive with the index lymph node measuring 19 mm in short axis diameter within the precarinal lymph node groups axial image # 56/3. The esophagus is unremarkable. Lungs/Pleura: Focal consolidation within the a right apex and scattered nodular infiltrate throughout the right upper  and, to a lesser extent, left upper and right middle lobes are again identified in keeping with multifocal infection in the acute setting. Punctate areas of cavitation are identified within the right apex. Superimposed mild emphysema. Small left pleural effusion appears partially loculated within the sub pulmonic region and appears decreased in size since prior examination but not completely evacuated. There is left-sided volume loss with subtotal collapse of the lower lobe and segmental atelectasis of the lingula. Moderate dependently layering right pleural effusion is present. Compressive atelectasis of the dependent right lower lobe noted. No pneumothorax. No central obstructing lesion. Bronchial wall thickening noted in keeping with airway inflammation. Upper Abdomen: No acute abnormality. Musculoskeletal:  No acute bone abnormality. No lytic or blastic bone lesion. IMPRESSION: 1. Status post aortic valve replacement and tube graft repair of the ascending aorta as well as left carotid-subclavian bypass grafting. Endovascular stent grafting of the aortic arch and descending thoracic aorta is again noted with the proximal portion of the stent graft covering the left subclavian artery origin. 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 2. Extensive multi-vessel coronary artery calcification. Stable cardiomegaly. 3. Morphologic changes in keeping with pulmonary arterial hypertension. 4. Multifocal pulmonary consolidation and nodular infiltrate in keeping with multifocal necrotizing infection. Findings appears stable since prior examination. 5. Moderate dependently layering right pleural effusion. 6. Small left pleural effusion appears partially loculated within the sub pulmonic region and appears decreased in size since prior examination but not completely evacuated. Associated left-sided volume loss with subtotal collapse of the lower lobe and segmental atelectasis  of the lingula. 7. Pathologic mediastinal adenopathy, slightly progressive since prior examination, likely reactive. 8. Mild emphysema. Aortic Atherosclerosis (ICD10-I70.0) and Emphysema (ICD10-J43.9). Electronically Signed   By: Helyn Numbers M.D.   On: 11/29/2022 02:05   DG CHEST PORT 1 VIEW  Result Date: 11/28/2022 CLINICAL DATA:  PICC verification. EXAM: PORTABLE CHEST 1 VIEW COMPARISON:  Radiograph earlier today, CT yesterday FINDINGS: Tip of the right upper extremity PICC projects over the lower SVC. Unchanged heart size and mediastinal contours, aortic stent graft. Left pleural effusion may have diminished slightly from earlier today. Associated compressive atelectasis. Patchy airspace disease in the left mid lung. Right pleural effusion may have increased. Peripheral opacity in the right upper lung zone. No pneumothorax. IMPRESSION: 1. Tip of the right upper extremity PICC projects over the lower SVC. 2. Shifting pleural effusions may be due to differences in positioning. 3. Bilateral airspace disease. Electronically Signed   By: Narda Rutherford M.D.   On: 11/28/2022 20:44   - pertinent xrays, CT, MRI studies were reviewed and independently interpreted  Positive ROS: All other systems have been reviewed and were otherwise negative with the exception of those mentioned in the HPI and as above.  Physical Exam: General: Not Alert, no acute distress Psychiatric: Patient is not competent for consent with flat l mood and affect Lymphatic: No axillary or cervical lymphadenopathy Cardiovascular: No pedal edema Respiratory: No cyanosis, no use of accessory musculature GI: No organomegaly, abdomen is soft and non-tender    Images:  @ENCIMAGES @  Labs:  Lab Results  Component Value Date   HGBA1C 6.1 (H) 08/27/2013   HGBA1C 5.8 (H) 05/08/2013   HGBA1C (H) 07/29/2010    5.9 (NOTE)                                                                       According to the ADA Clinical Practice  Recommendations for 2011, when HbA1c is used as a screening test:   >=6.5%   Diagnostic of Diabetes Mellitus           (if abnormal result  is confirmed)  5.7-6.4%   Increased risk of developing Diabetes Mellitus  References:Diagnosis and Classification of Diabetes Mellitus,Diabetes Care,2011,34(Suppl 1):S62-S69 and Standards of Medical Care in         Diabetes - 2011,Diabetes UEAV,4098,11  (Suppl  1):S11-S61.   REPTSTATUS PENDING 11/29/2022   GRAMSTAIN  10/10/2013    RARE WBC PRESENT, PREDOMINANTLY PMN NO ORGANISMS SEEN Performed at [facility]   CULT PENDING 11/29/2022    Lab Results  Component Value Date   ALBUMIN 2.6 (L) 11/29/2022   ALBUMIN 2.5 (L) 11/28/2022   ALBUMIN 3.9 06/23/2022        Latest Ref Rng & Units 11/29/2022    6:26 PM 11/28/2022    9:46 PM 06/23/2022    4:17 PM  CBC EXTENDED  WBC 4.0 - 10.5 K/uL  5.3  5.4   RBC 4.22 - 5.81 MIL/uL  2.71  3.13   Hemoglobin 13.0 - 17.0 g/dL 7.0  7.9  9.4   HCT 16.1 - 52.0 % 24.9  27.8  30.3   Platelets 150 - 400 K/uL  261  258   NEUT# 1.7 - 7.7 K/uL  3.8    Lymph# 0.7 - 4.0 K/uL  0.4      Neurologic: Patient does not have protective sensation bilateral lower extremities.   MUSCULOSKELETAL:   Skin: Examination patient does not have a palpable dorsalis pedis pulse bilaterally.  Examination the left foot there is ischemic changes to the left second toe with thin atrophic skin in the left foot.  Examination of the right foot there is a decubitus ischemic ulcer on the right heel lateral aspect of the right foot and a ischemic ulcer with exposed PIP joint of the right second toe.  Patient is awake but not alert or oriented.  Patient is nonverbal.  Assessment: Assessment: Ischemic ulcers both feet.  Plan: Plan: I have ordered ankle-brachial indices.  I do not feel patient has sufficient circulation to amputate the second toes bilaterally.  I am not sure patient is a candidate for endovascular intervention.  Thank you  for the consult and the opportunity to see Mr. Jaeden Schnittker, MD [facility] [phone] 8:14 AM

## 2022-11-30 NOTE — Progress Notes (Signed)
VASCULAR LAB    ABI has been performed.  See CV proc for preliminary results.   Kadar Chance, RVT 11/30/2022, 4:11 PM

## 2022-11-30 NOTE — Telephone Encounter (Signed)
New Message:     Daughter wants Dr Eldridge Dace have been moved to [facility]. She said they would like for Dr Eldridge Dace or someone from his team to please come see the patient. She said they have been asking at the nurse's station to please contact Dr Eldridge Dace.

## 2022-11-30 NOTE — Consult Note (Addendum)
Cardiology Consultation   Patient ID: KENSON GROH MRN: [id number]; DOB: [birth year]  Admit date: 11/28/2022 Date of Consult: 11/30/2022  PCP:  Casper Harrison, [name], MD   [facility] Providers Cardiologist:  Lance Muss, MD   {  Patient Profile:   [name] [name] is a 82 y.o. 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 Cardiology was consulted on 11/30/2022 for further evaluation of hypotension and to help optimize him for TEE  at the request of Dr. Rhona Leavens.   History of Present Illness:   [name] [name] is a 82 year old male with the above history who is followed by [name].  He was admitted in 02/2009 with a large ascending thoracic aneurysm, aortic arch aneurysm and acute aortic insufficiency and underwent AVR of aortic root with tissue valve and ascending aorta and replacement of aortic arch to the left subclavian artery.  H then had an acute MI in 02/2010 underwent DES to LCx.  He is admitted with acute type III aortic dissection which was treated medically; however, he ultimately required carotid subclavian bypass and thoracic stent graft in 08/2013 due to worsening dissection. He also has a history of chronic combined CHF and atrial fibrillation. He was admitted in 09/2019 with onset CHF. Echo showed LVEF of 25-30% with global hypokinesis. R/ LHC showed CTO of the RCA with collaterals and widely patent LCX  stent with up to 50% narrowing distal to the stent. PCWP was elevated at 32 mmHg. 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 He was started on GDMT with improvement in EF.  She was last seen by [name] in 06/2022 at which time he was stable from a cardiac standpoint.   He was admitted at [facility] from 10/29/2021 to 11/08/2022 for MRSA pneumonia and septicemia.  Echo showed LVEF of 40-45%, moderately enlarged RV with moderately reduced systolic function and moderately elevated PASP of 54 mmHg, severe biatrial enlargement, stable bioprosthetic aortic valve with mean gradient of 8 mmHg, mild to moderate MR, moderate to severe TR. It was unclear if he had a TEE at that time. He was discharged home on IV Cubicin for his septicemia and PO Doxycycline for his MRSA pneumonia.   His wife states he was doing well after this discharge until 11/27/2022 when he started to complain of some shortness of breath and was noted to have a fever. He went to a wound check appointment of his lower extremities and was noted to be hypoxic with O2 sat of 82%. Therefore, he was sent to the [facility] ED where a CT showed a large loculated left sided pleural effusion as well as a small right pleural effusion. He was also found to have a cavitary lesion in his right upper lobe that was consistent with MRSA pneumonia. WBC 6.2, Hgb 8.3, Plts 303. Na 140, K 3.6, Glucose, BUN 23, Cr 1.30. LFTs normal. Lactic acid 1.7. Troponin I 0.06 >> 0.06. Pro BNP elevated at 9,870. Procalcitonin was elevated at 0.12. He  underwent left thoracentesis by IR at [facility] with 900 cc of fluid removed. He was transferred to [facility] on 11/28/2022 for further evaluation. Hospitalization has been complicated by hypotension and acute on chronic anemia with hemoglobin as low at 7 requiring PRBCs. We were initially asked to perform TEE and he tentatively put on the board for tomorrow. However, there was concerns that with his hypotension  that he would not tolerate sedation well. Therefore, we were formally consulted for further evaluation.  At the time of this evaluation, patient is very lethargic and very weak. Majority of history is obtained from wife who is at bedside. As noted above, she states he was recovering well from his recent hospitalization until he suddenly started feeling unwell on 11/27/2022 with shortness of breath and a fever. He denies any orthopnea, PND, or edema. No chest pain. He is unaware of his atrial fibrillation and denies any palpitations, lightheadedness, dizziness, or syncope. No cough or nasal congestion. No nausea, vomiting, or diarrhea. No abnormal bleeding in urine or stools.   Past Medical History:  Diagnosis Date   Acute on chronic diastolic heart failure (HCC) 08/22/2013   Acute on chronic respiratory failure with hypoxia (HCC) 10/03/2019   Acute on chronic systolic heart failure (HCC) 05/11/2013   EF from 35% to 50-55% on 6/14 ECHO.    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 No evidence of dissection or endoleak.  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 No evidence of abdominal aortic dissection.  Occluded proximal left subclav   Aortic valve insufficiency    Arthritis    "probably in my knees before they replaced them" (01/28/2015)   Atrial fibrillation (HCC) 05/21/2013   Atrial flutter (HCC)    Bradycardia 08/23/2013   CAD (coronary artery disease)    Chest pain 01/28/2015   CHF (congestive heart failure) (HCC)    Chronic diastolic heart failure (HCC) 01/28/2015   Descending aortic aneurysm (HCC)    DVT (deep venous  thrombosis) (HCC)    ?LLE post knee surgery   Dyspnea 09/2019   HCAP (healthcare-associated pneumonia) 10/11/2013   History of blood transfusion    "after one of my knee surgeries"   HTN (hypertension)    Hyperlipidemia    Hypotension 12/17/2019   Incarcerated left inguinal hernia s/p repair 12/02/2019 12/01/2019   Insomnia 09/04/2013   Multiple fractures of ribs, right side, init for clos fx 09/04/2019   Near syncope 01/28/2015   Pressure injury of skin 10/06/2019   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 12/18/2019 12/17/2019   Recurrent UTI 10/06/2013   Shock circulatory (HCC) 10/07/2013   Thoracic aneurysm    Tobacco abuse    Urinary retention 10/06/2013   UTI (urinary tract infection) 10/06/2013    Past Surgical History:  Procedure Laterality Date   AORTIC VALVE REPLACEMENT  02/19/2009   AORTO-FEMORAL BYPASS GRAFT  03/2010   ascending aortic and arch aneurysm repair/notes 04/09/2009   CARDIAC VALVE REPLACEMENT     CAROTID-SUBCLAVIAN BYPASS GRAFT Left 08/26/2013   Procedure: BYPASS GRAFT CAROTID-SUBCLAVIAN;  Surgeon: Nada Libman, MD;  Location: [facility] OR;  Service: Vascular;  Laterality:  Left;   CATARACT EXTRACTION W/ INTRAOCULAR LENS  IMPLANT, BILATERAL Bilateral    ENDOVASCULAR STENT INSERTION N/A 08/26/2013   Procedure:  THORACIC STENT GRAFT INSERTION;  Surgeon: Nada Libman, MD;  Location: [facility] OR;  Service: Vascular;  Laterality: N/A;   EYE SURGERY     INGUINAL HERNIA REPAIR Left 12/02/2019   Procedure: REPAIR LEFT INGUINAL HERNIA WITH MESH;  Surgeon: Violeta Gelinas, MD;  Location: [facility] OR;  Service: General;  Laterality: Left;   INGUINAL HERNIA REPAIR Left 12/18/2019   Procedure: LAPAROSCOPIC ASSISTED REPAIR OF RECURRENT INCARCERATED LEFT INGUINAL HERNIA WITH MESH; LAPAOSCOPIC REPAIR OF SEROSAL TEAR;  Surgeon: Gaynelle Adu, MD;  Location: [facility] OR;  Service: General;  Laterality: Left;   INSERTION OF MESH Left 12/02/2019   Procedure: INSERTION OF MESH;   Surgeon: Violeta Gelinas, MD;  Location: [facility] OR;  Service: General;  Laterality: Left;   JOINT REPLACEMENT     KNEE ARTHROSCOPY Bilateral    REPLACEMENT TOTAL KNEE Bilateral    right groin lymphocele Right 03/29/2009   RIGHT/LEFT HEART CATH AND CORONARY ANGIOGRAPHY N/A 10/06/2019   Procedure: RIGHT/LEFT HEART CATH AND CORONARY ANGIOGRAPHY;  Surgeon: Lyn Records, MD;  Location: [facility] INVASIVE CV LAB;  Service: Cardiovascular;  Laterality: N/A;   THORACENTESIS  2010 X 2   TONSILLECTOMY       Home Medications:  Prior to Admission medications   Medication Sig Start Date End Date Taking? Authorizing Provider  acetaminophen (TYLENOL) 500 MG tablet Take 2 tablets (1,000 mg total) by mouth every 6 (six) hours as needed. Patient taking differently: Take 1,000 mg by mouth every 6 (six) hours as needed for mild pain or moderate pain. 09/08/19  Yes Barnetta Chapel, PA-C  albuterol (PROVENTIL) (2.5 MG/3ML) 0.083% nebulizer solution Take by nebulization. 06/22/22  Yes [provider]  amiodarone (PACERONE) 200 MG tablet Take 1 tablet (200 mg total) by mouth daily. 07/12/22  Yes [name], MD  amoxicillin (AMOXIL) 500 MG capsule Take 4 capsules by mouth 30-60 min prior to dental procedure. Patient taking differently: Take 1,000 mg by mouth See admin instructions. Take 4 capsules by mouth 30-60 min prior to dental procedures 03/15/20  Yes [name], MD  apixaban (ELIQUIS) 2.5 MG TABS tablet Take 1 tablet (2.5 mg total) by mouth 2 (two) times daily. 11/15/22  Yes [name], MD  DAPTOmycin (CUBICIN) 500 MG injection Inject 500 mg into the vein daily. 11/28/22  Yes [provider]  ferrous sulfate (SV IRON) 325 (65 FE) MG tablet Take 1 tablet (325 mg total) by mouth daily with breakfast. 03/17/22  Yes [name], MD  furosemide (LASIX) 40 MG tablet Take 1 tablet (40 mg total) by mouth 2 (two) times daily as needed. Patient taking differently: Take 40 mg by mouth  daily. 03/17/22  Yes [name], MD  lansoprazole (PREVACID) 30 MG capsule Take 30 mg by mouth daily. 08/13/19  Yes [provider]  levocetirizine (XYZAL) 5 MG tablet Take 5 mg by mouth daily. 04/21/21  Yes [provider]  metoprolol succinate (TOPROL-XL) 25 MG 24 hr tablet Take 25 mg by mouth daily.   Yes [provider]  Multiple Vitamin (MULTIVITAMIN WITH MINERALS) TABS tablet Take 1 tablet by mouth daily.   Yes [provider]  Multiple Vitamins-Minerals (ZINC PO) Take 1 tablet by mouth daily at 6 (six) AM.   Yes [provider]  tamsulosin (FLOMAX) 0.4 MG CAPS capsule Take 0.4 mg by mouth.  Yes [provider]  traMADol (ULTRAM) 50 MG tablet Take 50 mg by mouth every 6 (six) hours as needed for moderate pain. 11/27/22  Yes [provider]    Inpatient Medications: Scheduled Meds:  amiodarone  200 mg Oral Daily   Chlorhexidine Gluconate Cloth  6 each Topical Daily   heparin  5,000 Units Subcutaneous Q8H   loratadine  10 mg Oral Daily   midodrine  2.5 mg Oral TID WC   pantoprazole  40 mg Oral Daily   tamsulosin  0.4 mg Oral Daily   Continuous Infusions:  lactated ringers 75 mL/hr at 11/30/22 1648   linezolid (ZYVOX) IV 600 mg (11/30/22 1045)   PRN Meds: acetaminophen **OR** acetaminophen, albuterol, ondansetron **OR** ondansetron (ZOFRAN) IV, traMADol  Allergies:    Allergies  Allergen Reactions   Lortab [Hydrocodone-Acetaminophen] Hives    No trouble breathing   Morphine And Codeine Hives   Other Other (See Comments)    Staples from surgery caused infection    Social History:   Social History   Socioeconomic History   Marital status: Married    Spouse name: Not on file   Number of children: Not on file   Years of education: Not on file   Highest education level: Not on file  Occupational History   Not on file  Tobacco Use   Smoking status: Former    Packs/day: 0    Types: Cigarettes    Quit  date: 04/16/1974    Years since quitting: 48.6   Smokeless tobacco: Never  Vaping Use   Vaping Use: Never used  Substance and Sexual Activity   Alcohol use: No   Drug use: No   Sexual activity: Not Currently  Other Topics Concern   Not on file  Social History Narrative   Not on file   Social Determinants of Health   Financial Resource Strain: Not on file  Food Insecurity: Patient Unable To Answer (11/28/2022)   Hunger Vital Sign    Worried About Running Out of Food in the Last Year: Patient unable to answer    Ran Out of Food in the Last Year: Patient unable to answer  Transportation Needs: Patient Unable To Answer (11/28/2022)   PRAPARE - Transportation    Lack of Transportation (Medical): Patient unable to answer    Lack of Transportation (Non-Medical): Patient unable to answer  Physical Activity: Not on file  Stress: Not on file  Social Connections: Not on file  Intimate Partner Violence: Not At Risk (11/28/2022)   Humiliation, Afraid, Rape, and Kick questionnaire    Fear of Current or Ex-Partner: No    Emotionally Abused: No    Physically Abused: No    Sexually Abused: No    Family History:   Family History  Problem Relation Age of Onset   Celiac disease Daughter    Aortic aneurysm Father    Hypertension Mother    Heart attack Neg Hx    Stroke Neg Hx      ROS:  Please see the history of present illness.  Review of Systems  Constitutional:  Positive for fever and malaise/fatigue (lethargic).  HENT:  Negative for congestion.   Respiratory:  Positive for shortness of breath. Negative for cough.   Cardiovascular:  Negative for chest pain, palpitations, orthopnea, leg swelling and PND.  Gastrointestinal:  Negative for blood in stool, diarrhea, melena, nausea and vomiting.  Genitourinary:  Negative for hematuria.  Musculoskeletal:  Negative for myalgias.  Skin:  Chronic wound on second toes on bilateral feet  Neurological:  Positive for weakness. Negative for  dizziness and loss of consciousness.  Endo/Heme/Allergies:  Does not bruise/bleed easily.  Psychiatric/Behavioral:  Substance abuse: remote smoking history.    Physical Exam/Data:   Vitals:   11/30/22 0530 11/30/22 0746 11/30/22 1106 11/30/22 1300  BP: 104/64 105/60 (!) 102/57   Pulse: 99 93 (!) 107   Resp:  19 19   Temp: 98.6 F (37 C) 97.8 F (36.6 C) 97.9 F (36.6 C) 98.7 F (37.1 C)  TempSrc: Oral Oral Oral Oral  SpO2: 100% 99%    Weight:        Intake/Output Summary (Last 24 hours) at 11/30/2022 1752 Last data filed at 11/30/2022 0123 Gross per 24 hour  Intake 1316.79 ml  Output 200 ml  Net 1116.79 ml      11/30/2022    2:58 AM 11/29/2022    4:23 AM 06/23/2022    3:47 PM  Last 3 Weights  Weight (lbs) 187 lb 6.3 oz 190 lb 14.7 oz 188 lb  Weight (kg) 85 kg 86.6 kg 85.276 kg     Body mass index is 26.51 kg/m.  General: 82 y.o. ill appearing Caucasian male resting comfortably in no acute distress. HEENT: Normocephalic and atraumatic. Sclera clear. Neck: Supple. JVD elevated. Heart: Irregularly irregular rhythm with normal rate. II/VI systolic murmur heard best along left sternal border and apex.  Lungs: No increased work of breathing. Diminished breath sounds in bilateral bases.  Abdomen: Soft, non-distended, and non-tender to palpation. MSK: Normal strength and tone for age. Extremities: No lower extremity edema.    Skin: Warm and dry. Neuro: Alert and oriented x3. No focal deficits. Psych: Normal affect. Responds appropriately.  EKG:  The EKG was personally reviewed and demonstrates:  No EKG available this admission.  Telemetry:  Telemetry was personally reviewed and demonstrates:  Atrial fibrillation with rates in the 90s.  Relevant CV Studies:  Right/ Left Cardiac Catheterization 10/06/2019: Chronic total occlusion of the mid right coronary, collateralized right to right and left to right. Widely patent left main. Widely patent LAD with moderate luminal  irregularities proximal to distal. Widely patent stent in the proximal to mid circumflex with up to 50% narrowing distal to the stent. Moderate pulmonary hypertension.  Mean pulmonary artery pressure 35 mmHg. Mean pulmonary capillary wedge pressure 32 mmHg.   RECOMMENDATIONS: Left ventricular systolic dysfunction is not on the basis of coronary disease.  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  Diagnostic Dominance: Right    _______________  Echocardiogram 11/02/2022 [facility]): LV: LV chamber is normal in size.  LVEF 40-45%.  Mild LVH. RV: RV moderately enlarged with moderately reduced systolic function.  Moderately elevated PASP of 54 mmHg. LA: Severely dilated. RA: Severely dilated. Aortic valve: Bioprosthetic aortic valve is in place with no significant AI.  Mean gradient 8 mmHg. Mitral valve: Mild mitral valve annular calcification.  Mild to moderate MR. Tricuspid valve: Moderate to severe TR.   Laboratory Data:  High Sensitivity Troponin:  No results for input(s): "TROPONINIHS" in the last 720 hours.   Chemistry Recent Labs  Lab 11/28/22 2146 11/29/22 0906 11/30/22 0840  NA 140 139 139  K 3.8 4.1 3.7  CL 98 94* 95*  CO2 30 26 29   GLUCOSE 115* 133* 102*  BUN 26* 33* 38*  CREATININE 2.15* 2.61* 2.98*  CALCIUM 7.8* 8.0* 7.7*  GFRNONAA 30* 24* 20*  ANIONGAP 12 19* 15    Recent Labs  Lab 11/28/22  2146 11/29/22 0906 11/30/22 0840  PROT 5.8* 6.3* 5.5*  ALBUMIN 2.5* 2.6* 2.2*  AST 46* 354* 156*  ALT 19 161* 113*  ALKPHOS 72 116 94  BILITOT 0.8 1.2 1.1   Lipids No results for input(s): "CHOL", "TRIG", "HDL", "LABVLDL", "LDLCALC", "CHOLHDL" in the last 168 hours.  Hematology Recent Labs  Lab 11/28/22 2146 11/29/22 1826 11/30/22 0840 11/30/22 1641  WBC 5.3  --  5.8  --   RBC 2.71*  --  2.77*  --   HGB 7.9* 7.0* 8.0* 7.9*  HCT 27.8* 24.9* 27.7* 27.1*  MCV 102.6*  --  100.0  --   MCH 29.2  --  28.9  --   MCHC 28.4*  --  28.9*  --   RDW 18.6*  --   18.6*  --   PLT 261  --  255  --    Thyroid No results for input(s): "TSH", "FREET4" in the last 168 hours.  BNP Recent Labs  Lab 11/28/22 2146  BNP 1,270.5*    DDimer No results for input(s): "DDIMER" in the last 168 hours.   Radiology/Studies:  VAS [place] ABI WITH/WO TBI  Result Date: 11/30/2022  LOWER EXTREMITY DOPPLER STUDY Patient Name:  [name] [name]  Date of Exam:   11/30/2022 Medical Rec #: [id number]          Accession #:    [id number] Date of Birth: [birth year]          Patient Gender: M Patient Age:   66 years Exam Location:  [facility] Procedure:      VAS [place] ABI WITH/WO TBI Referring Phys: MARCUS DUDA --------------------------------------------------------------------------------  Indications: Rest pain, ulceration, and peripheral artery disease. High Risk Factors: Hypertension, hyperlipidemia, coronary artery disease. Other Factors: Aortic valve replacement replacement of aortic root and ascending                aorta and replacement of aortic arch to the left subclavian                artery. 2010.  Comparison Study: No prior study on file Performing Technologist: Sherren Kerns RVS  Examination Guidelines: A complete evaluation includes at minimum, Doppler waveform signals and systolic blood pressure reading at the level of bilateral brachial, anterior tibial, and posterior tibial arteries, when vessel segments are accessible. Bilateral testing is considered an integral part of a complete examination. Photoelectric Plethysmograph (PPG) waveforms and toe systolic pressure readings are included as required and additional duplex testing as needed. Limited examinations for reoccurring indications may be performed as noted.  ABI Findings: +---------+------------------+-----+-------------------+---------+ Right    Rt Pressure (mmHg)IndexWaveform           Comment   +---------+------------------+-----+-------------------+---------+ Brachial                        multiphasic         PICC line +---------+------------------+-----+-------------------+---------+ PTA      55                0.54 dampened monophasic          +---------+------------------+-----+-------------------+---------+ DP       48                0.48 dampened monophasic          +---------+------------------+-----+-------------------+---------+ Great Toe0                 0.00 Absent                       +---------+------------------+-----+-------------------+---------+ +---------+------------------+-----+-------------------+-------+  Left     Lt Pressure (mmHg)IndexWaveform           Comment +---------+------------------+-----+-------------------+-------+ Brachial 101                    multiphasic                +---------+------------------+-----+-------------------+-------+ PTA      58                0.57 dampened monophasic        +---------+------------------+-----+-------------------+-------+ DP       0                 0.00 absent                     +---------+------------------+-----+-------------------+-------+ Great Toe0                 0.00 Absent                     +---------+------------------+-----+-------------------+-------+ +-------+-----------+-----------+------------+------------+ ABI/TBIToday's ABIToday's TBIPrevious ABIPrevious TBI +-------+-----------+-----------+------------+------------+ Right  0.54       absent                              +-------+-----------+-----------+------------+------------+ Left   0.57       absent                              +-------+-----------+-----------+------------+------------+  Summary: Right: Resting right ankle-brachial index indicates moderate right lower extremity arterial disease. The right toe-brachial index is abnormal. Left: Resting left ankle-brachial index indicates moderate left lower extremity arterial disease. The left toe-brachial index is abnormal. *See table(s) above for  measurements and observations.  Suggest Peripheral Vascular Consult.    Preliminary    CT CHEST WO CONTRAST  Result Date: 11/29/2022 CLINICAL DATA:  Pneumonia.  Left thoracentesis 11/28/2022. EXAM: CT CHEST WITHOUT CONTRAST TECHNIQUE: Multidetector CT imaging of the chest was performed following the standard protocol without IV contrast. RADIATION DOSE REDUCTION: This exam was performed according to the departmental dose-optimization program which includes automated exposure control, adjustment of the mA and/or kV according to patient size and/or use of iterative reconstruction technique. COMPARISON:  11/27/2022. FINDINGS: Cardiovascular: Status post aortic valve replacement and tube graft repair of the ascending aorta as well as left carotid-subclavian bypass grafting. Endovascular stent grafting of the aortic arch and descending thoracic aorta is again noted with the proximal portion of the stent graft covering the left subclavian artery origin. 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 Extensive multi-vessel coronary artery calcification. Stable cardiomegaly. No pericardial effusion. Central pulmonary arteries are enlarged in keeping with changes of pulmonary arterial hypertension. Right upper extremity PICC line tip is seen at the superior cavoatrial junction. Mediastinum/Nodes: Visualized thyroid is unremarkable. Pathologic mediastinal adenopathy appears slightly progressive with the index lymph node measuring 19 mm in short axis diameter within the precarinal lymph node groups axial image # 56/3. The esophagus is unremarkable. Lungs/Pleura: Focal consolidation within the a right apex and scattered nodular infiltrate throughout the right upper and, to a lesser extent, left upper and right middle lobes are again identified in keeping with multifocal infection in the  acute setting. Punctate areas of cavitation are identified  within the right apex. Superimposed mild emphysema. Small left pleural effusion appears partially loculated within the sub pulmonic region and appears decreased in size since prior examination but not completely evacuated. There is left-sided volume loss with subtotal collapse of the lower lobe and segmental atelectasis of the lingula. Moderate dependently layering right pleural effusion is present. Compressive atelectasis of the dependent right lower lobe noted. No pneumothorax. No central obstructing lesion. Bronchial wall thickening noted in keeping with airway inflammation. Upper Abdomen: No acute abnormality. Musculoskeletal: No acute bone abnormality. No lytic or blastic bone lesion. IMPRESSION: 1. Status post aortic valve replacement and tube graft repair of the ascending aorta as well as left carotid-subclavian bypass grafting. Endovascular stent grafting of the aortic arch and descending thoracic aorta is again noted with the proximal portion of the stent graft covering the left subclavian artery origin. 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 2. Extensive multi-vessel coronary artery calcification. Stable cardiomegaly. 3. Morphologic changes in keeping with pulmonary arterial hypertension. 4. Multifocal pulmonary consolidation and nodular infiltrate in keeping with multifocal necrotizing infection. Findings appears stable since prior examination. 5. Moderate dependently layering right pleural effusion. 6. Small left pleural effusion appears partially loculated within the sub pulmonic region and appears decreased in size since prior examination but not completely evacuated. Associated left-sided volume loss with subtotal collapse of the lower lobe and segmental atelectasis of the lingula. 7. Pathologic mediastinal adenopathy, slightly progressive since prior examination, likely reactive. 8.  Mild emphysema. Aortic Atherosclerosis (ICD10-I70.0) and Emphysema (ICD10-J43.9). Electronically Signed   By: Helyn Numbers M.D.   On: 11/29/2022 02:05   DG CHEST PORT 1 VIEW  Result Date: 11/28/2022 CLINICAL DATA:  PICC verification. EXAM: PORTABLE CHEST 1 VIEW COMPARISON:  Radiograph earlier today, CT yesterday FINDINGS: Tip of the right upper extremity PICC projects over the lower SVC. Unchanged heart size and mediastinal contours, aortic stent graft. Left pleural effusion may have diminished slightly from earlier today. Associated compressive atelectasis. Patchy airspace disease in the left mid lung. Right pleural effusion may have increased. Peripheral opacity in the right upper lung zone. No pneumothorax. IMPRESSION: 1. Tip of the right upper extremity PICC projects over the lower SVC. 2. Shifting pleural effusions may be due to differences in positioning. 3. Bilateral airspace disease. Electronically Signed   By: Narda Rutherford M.D.   On: 11/28/2022 20:44     Assessment and Plan:   JARI VANSLOOTEN is a 82 y.o. 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 Cardiology was consulted on 11/30/2022 for further evaluation of hypotension and to help optimize him for TEE  at the request of Dr. Rhona Leavens.   Shock Patient was recently admitted at [facility] last month with MRSA pneumonia and septicemia. He presented with acute  hypoxic respiratory failure with O2 sats of 82% and was noted to have a loculated large loculated left sided pleural effusion as well as a small right pleural effusion. He  was also found to have a cavitary lesion in his right upper lobe that was consistent with MRSA pneumonia. S/p thoracentesis he was transferred to [facility] for further management. Hospitalization has been complicated by persistent hypotension with BP as low as the low 80s. He now has AKI and transaminitis consistent with poor perfusion. Procalcitonin was 0.12 at [place]. Lactic acid peaked at 2.8 yesterday and now 1.3. Echo last month showed LVEF of 40-45%. Suspect hypotension is probably more septic in nature, as does appear has improved with fluids. Will repeat procalcitonin, lactic acid, and Echo. If EF is down, may need to consider PICC line and COOX. Patient is tenuous, would recommend PCCM evaluation  CAD S/p DES to LCX in 2011. No chest pain or anginal symptoms. Home Toprol-XL held due to hypotension. No aspirin given need for chronic anticoagulation. Does not appear to be on a statin.   Chronic HFrEF Recent Echo in 10/2022 at [facility] showed LVEF of 40-45%, moderately enlarged RV with moderately reduced systolic function and moderately elevated PASP of 54 mmHg, severe biatrial enlargement, stable bioprosthetic aortic valve with mean gradient of 8 mmHg, mild to moderate MR, moderate to severe TR. Pro BNP elevated in the 9,000s on presentation to [facility]. BNP 1,270 here. No overt edema noted on chest CT here. Findings more consistent with infectious etiology. Continue to hold home Losartan and Toprol-XL given persistent hypotension.Will repeat Echo as above.  Permanent Atrial Fibrillation Rate controlled in the 90s. Home Toprol-XL on hold in setting of hypotension. Will hold home Amiodarone for now given he is rate controlled and his LFTs are elevated.On chronic anticoagulation with Eliquis at home but patient had been  holding this in anticipation of upcoming toe amputations next week. Will hold off on starting him on IV Heparin given he required a blood transfusion today.  History of Type B Thoracic Aortic Dissection and Acute Aortic Insufficiency S/p initial AVR and replacement of aortic arch to the left subclavian artery in 2010 with recurrent dissection ultimately requiring carotid subclavian bypass and thoracic stent graft in 2015. Echo in 10/2022 showed stable AVR with mean gradient of 8 mmHg.  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   AKI Creatinine was 1.4 on presentation at [facility] but has been trending up here:  2.15 >> 2.61 >> 2.98. Suspect due to shock as above.   Transaminitis LFTs were normal on presentation at [facility]. AST and ALT peaked at 354 and 161 respectively on 5/13 and now are trending down. Suspect this is due to shock liver from hypotension. Continue IV fluids and Midodrine per primary team  MRSA Bacteremia Recently admitted with MRSA pneumonia and bacteremia in 10/2022 at [facility]. It does not sound like patient had a TEE at that time. ID has recommended a TEE. Currently not stable with this given persistent hypotension and continue hypoxic respiratory failure requiring 5L of HFNC. Can re-discuss timing of this as he becomes more stable.  Otherwise, per primary team: - MRSA pneumonia and bacteremia: ID following  - Acute hypoxic respiratory failure - Acute on chronic anemia: S/p 1 unit of PRBCs on 6/13 - Right heel stage 3 pressure ulcer - Chronic lower extremity wounds   Risk Assessment/Risk Scores:    New York Heart Association (NYHA) Functional Class NYHA Class II  CHA2DS2-VASc Score = 6  This indicates a 9.7% annual risk of stroke. The patient's score is based upon: CHF History: 1 HTN History:  1 Diabetes History: 1 Stroke History: 0 Vascular Disease History: 1 Age Score: 2 Gender  Score: 0     For questions or updates, please contact [facility] Please consult [email or web address] for contact info under    Signed, Corrin Parker, PA-C  11/30/2022 5:52 PM  Patient seen and examined.  Agree with above documentation.  [name] [name] is an 82 year old male with a history of CAD status post DES to LCx in 2011, chronic combined heart failure with most recent echo showing EF 40 to 45%, permanent atrial fibrillation, type B aortic dissection and AI status post AVR and replacement of aortic arch to left subclavian artery with recurrent dissection requiring carotid subclavian bypass and thoracic stent graft in 2015, hypertension.  We are consulted today by Dr. Rhona Leavens initially for TEE evaluation, but patient was not felt to be stable enough to undergo sedation due to hypotension and oxygen requirements, and formal cardiology consult was requested.  He was admitted in May 2024 at [facility] with MRSA bacteremia.  TTE at that time showed EF 40 to 45%, moderately reduced RV function, severe biatrial enlargement, stable bioprosthetic aortic valve, moderate to severe tricuspid regurgitation.  Does not appear TEE was done at that time.  He was discharged on IV daptomycin and p.o. doxycycline.  He presented back to [facility] 11/27/2022 with worsening shortness of breath.  He was found to be hypoxic to low 80s.  CT chest showed large loculated left-sided pleural effusion as well as small right pleural effusion and cavitary lesion in right upper lobe.  Labs notable for creatinine 1.3, WBC 6.2, lactate 1.7, troponin 0.06 > 0.06, procalcitonin 0.12.  Underwent thoracentesis with 900 cc fluid removed.  He was transferred to [facility] on 11/28/2022.  Noted to have anemia with hemoglobin down to 7, received 1 unit PRBCs.  Has had BP down to 81/45.  Worsening creatinine since admission, 2.15 > 2.61 > 2.98, hemoglobin 8.0, AST 354> 156 ALT 161> 113, Lactate 2.8 > 1.3.  No EKG this admission.  On exam, patient is  confused, oriented x 1, irregular rhythm, normal rate, 2 out of 6 systolic murmur, diminished breath sounds, no LE edema, + JVD.  Findings consistent with shock, with evidence of poor perfusion with elevated lactate, elevated LFTs, and worsening AKI, in addition to altered mental status.  Feels warm on exam, does not appear volume overloaded but does have elevated JVD (though could be due to tricuspid regurgitation, as most recent echo showed moderate to severe TR).  Considering this, as well as recent echo with only mild systolic dysfunction as well as recent infectious issues and fever on presentation, suspect likely septic shock.  He is having worsening confusion.  His family reports that his confusion resolved at [facility] when he was started on BiPAP.  ABG today does show hypercapnia, may benefit from restarting BiPAP.  He appears very tenuous, would recommend PCCM evaluation.  Not a candidate for TEE at this time.  Will update transthoracic echocardiogram.  Little Ishikawa, MD

## 2022-11-30 NOTE — Progress Notes (Signed)
Progress Note   Patient: [name] [name] WGN:[id number] DOB: Oct 15, 1940 DOA: 11/28/2022     2 DOS: the patient was seen and examined on 11/30/2022   Brief hospital course: 82 year old male prior history of chronic systolic heart failure EF of 40 to 45%, history of bioprosthetic aortic valve replacement, history of aortic aneurysm, peripheral vascular disease, chronic A-fib on Eliquis, chronic wounds on his bilateral lower extremities, history of anemia, presents to the ER at [facility] yesterday for hypoxia.  He had been seen in wound care clinic and was noted to have a room air O2 saturations of 82%.  He was sent to the ER.   Last month, he was admitted to [facility] where he was diagnosed with MRSA pneumonia and septicemia.  Unclear if he had a transesophageal echocardiogram done.  He was discharged home on IV Cubicin for his septicemia and p.o. doxycycline for his MRSA pneumonia.  Reportedly he had completed his doxycycline for his MRSA pneumonia and had been continued on IV Cubicin.  in the [facility] ER, he had a CT scan which demonstrated a large loculated left-sided pleural effusion. He also had a small right pleural effusion. He also had a cavitary lesion in his right upper lobe that was consistent with his history of MRSA pneumonia. He underwent thoracentesis yielding 900cc of fluid  Pt was transferred to [facility] for formal ID evaluation  Assessment and Plan: Loculated pleural effusion with cavitary PNA and MRSA bacteremia with severe sepsis with septic shock present on admit -RH documents 900 ml of pleural fluid removed from left side thoracentesis.  -Repeat CT chest reviewed. Moderate R layering effusion and small small residual L effusion with multifocal necrotizing PNA -Initially hypotensive with elevated lactate, ARF. Improved with IVF and addition of midodrine -ID following. 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 TEE considered, however there is concern that pt's  hemodynamics may not tolerate procedure - Pt is continued on zyvox for now with hope for renal recovery and transition to vanc for better lung penetration   Acute respiratory failure with hypoxia (HCC) Secondary to his pleural effusion and cavitary PNA -Cont o2 and wean as tolerated -ABG reviewed, c02 is slightly higher at 62. Seems more lethargic -Have consulted PCCM to follow and assist, suspect would benefit from biapap   R heel stage 3 pressure ulcer, present on admit -reportedly had been planned for outpatient bilateral 2nd toe amputations at [facility] which had been canceled as pt is now in hospital.  -Have reached out to Dr. Lajoyce Corners. Appreciate input. Per Ortho, suspicion that pt does not have sufficient circulation to amputate the 2nd toes. ABI's are pending   Atrial fibrillation (HCC) [name] eliquis had been on hold in anticipation for planned amputation  -toprolol XL reordered, but held secondary to hypotension -remains rate controlled at this time   Chronic systolic CHF (congestive heart failure) (HCC) Elevated pro-BNP at [facility] to 9870. Presenting BNP 1,270 -no LE edema. No evidence of pulm edema on chest CT -lasix on hold secondary to hypotension -Appreciate input by cardiology. Repeat echo pending   S/P AVR (aortic valve replacement) Stable.   MRSA bacteremia with severe sepsis and septic shock present on admit -Pt has been on IV cubicin at home for MRSA septicemia.  -Reportedly pt had developed renal insufficiency when on IV vancomycin. Had been on po doxycycline at home and reportedly had completed this course. -Currently on zyvox   Normocytic anemia -Without obvious evidence of bleeding at this tiem -Hgb trends had  been trending down, required 1 unit of PRBC overnight with appropriate rise in hgb -f/u heme stool  Hypotension secondary to septic shock -Seems fluid responsive -Hold BP meds and diuretics -Started low dose midodrine -Giving gentle IVF per above -recheck bmet in  AM  ARF -Cr of 1.2 on 06/23/22.  -cont to hold nephrotoxic agents -Cr up to 2.98 today -Cont IVF as tolerated      Subjective: More tired this AM  Physical Exam: Vitals:   11/30/22 0530 11/30/22 0746 11/30/22 1106 11/30/22 1300  BP: 104/64 105/60 (!) 102/57   Pulse: 99 93 (!) 107   Resp:  19 19   Temp: 98.6 F (37 C) 97.8 F (36.6 C) 97.9 F (36.6 C) 98.7 F (37.1 C)  TempSrc: Oral Oral Oral Oral  SpO2: 100% 99%    Weight:       General exam: drowsy, but arousable, in no acute distress Respiratory system: normal chest rise, clear, no audible wheezing Cardiovascular system: regular rhythm, s1-s2 Gastrointestinal system: Nondistended, nontender, pos BS Central nervous system: No seizures, no tremors Extremities: No cyanosis, no joint deformities Skin: No rashes, no pallor Psychiatry: difficult to assess given mentation  Data Reviewed:  Labs reviewed: Na 139, K 3.7, Cr 2. 98,   Family Communication: Pt in room, family at bedside  Disposition: Status is: Inpatient Continue inpatient stay because: Severity of illness  Planned Discharge Destination:  Unclear at this time     Author: Rickey Barbara, MD 11/30/2022 6:43 PM  For on call review [email or web address].

## 2022-11-30 NOTE — Consult Note (Addendum)
NAME:  [name] [name], MRN:  [id number], DOB:  [birth year], LOS: 2 ADMISSION DATE:  11/28/2022, CONSULTATION DATE:  6/13 REFERRING MD:  Dr. Rhona Leavens, CHIEF COMPLAINT:  septic shock   History of Present Illness:  Patient is a 82 yo Male w/ pertinent PMH systolic chf, hx of AV replacement, PVD, chronic a-fib on eliquis, chronic wounds on BLE presents to [facility] on 6/11 w/ hypoxia.  Patient recently admitted to [facility] last moth w/ MRSA pneumonia and bacteremia. Discharged on IV cubicin and po doxy. Recent echo from 10/2022 showing lvef 40-50%. On 6/11 patient seen in wound care clinic. Sats on RA 82%. Came to [facility] ER. CT scan showing large loculated left sided pleural effusion and small right pleural effusion. Cavitary lesion in RUL from recent MRSA pneumonia. Had thoracentesis w/ 900 cc fluid removed. Patient started on Zyvox. Patient transferred to [facility] on 6/11.  Upon arrival to [facility], patient sats improved with 5L [place]. ID consulted and continued zyvox w/ plan to start vanc when renal recovers. 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 Tmax 100.5. BP soft with improvement w/ IV fluids and midodrine. Also has R heel stage 3 pressure ulcer w/ plans on 2nd toe amputation at [facility]. Ortho consulted.  Patient having intermittent episodes of hypotension but responding well to IV fluids. Had episode of anemia requiring 1 unit PRBCs. Cards consulted for hypotension and consideration of TEE. Patient currently to unstable to receive TEE at this time. PCCM consulted for continual hypotension.   Pertinent  Medical History   Past Medical History:  Diagnosis Date   Acute on chronic diastolic heart failure (HCC) 08/22/2013   Acute on chronic respiratory failure with hypoxia (HCC) 10/03/2019   Acute on chronic systolic heart failure (HCC) 05/11/2013   EF from 35% to 50-55% on 6/14 ECHO.    Acute respiratory failure with hypoxia (HCC) 10/07/2013   AKI (acute kidney injury) (HCC) 12/17/2019    Aortic atherosclerosis (HCC) 05/05/2013   Aortic dissection (HCC) 05/04/2013    Descending only, had aneurysm of ascending but no dissection   02/12/2014 Stable aortic stent graft over the aortic arch and descending thoracic aorta with significant reduction in the mural thrombus of the native aneurysm sac. No evidence of dissection or endoleak.  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 No evidence of abdominal aortic dissection.  Occluded proximal left subclav   Aortic valve insufficiency    Arthritis    "probably in my knees before they replaced them" (01/28/2015)   Atrial fibrillation (HCC) 05/21/2013   Atrial flutter (HCC)    Bradycardia 08/23/2013   CAD (coronary artery disease)    Chest pain 01/28/2015   CHF (congestive heart failure) (HCC)    Chronic diastolic heart failure (HCC) 01/28/2015   Descending aortic aneurysm (HCC)    DVT (deep venous thrombosis) (HCC)    ?LLE post knee surgery   Dyspnea 09/2019   HCAP (healthcare-associated pneumonia) 10/11/2013   History of blood transfusion    "after one of my knee surgeries"   HTN (hypertension)    Hyperlipidemia    Hypotension 12/17/2019   Incarcerated left inguinal hernia s/p repair 12/02/2019 12/01/2019   Insomnia 09/04/2013   Multiple fractures of ribs, right side, init for clos fx 09/04/2019   Near syncope 01/28/2015   Pressure injury of skin 10/06/2019   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 12/18/2019 12/17/2019   Recurrent UTI 10/06/2013   Shock circulatory (HCC) 10/07/2013   Thoracic aneurysm  Tobacco abuse    Urinary retention 10/06/2013   UTI (urinary tract infection) 10/06/2013     Significant Hospital Events: Including procedures, antibiotic start and stop dates in addition to other pertinent events   6/11 admitted to [facility] w/ hypoxia 6/13 worsening hypotension improved w/ iv fluids; pccm consulted  Interim History / Subjective:  See above  Objective   Blood pressure  (!) 102/57, pulse (!) 107, temperature 98.7 F (37.1 C), temperature source Oral, resp. rate 19, weight 85 kg, SpO2 99 %.        Intake/Output Summary (Last 24 hours) at 11/30/2022 1815 Last data filed at 11/30/2022 1800 Gross per 24 hour  Intake 2001.38 ml  Output --  Net 2001.38 ml   Filed Weights   11/29/22 0423 11/30/22 0258  Weight: 86.6 kg 85 kg    Examination: General: ill appearing male HEENT: MM pink/moist Neuro: AO CV: s1s2, afib rate 80s, no m/r/g PULM:  dim clear BS bilaterally; [place] 5L GI: soft, bsx4 active  Extremities: warm/dry, no edema; b/l 2nd toe w/ gauze dressing in place Skin: no rashes or lesions    Resolved Hospital Problem list     Assessment & Plan:  Hypotension secondary to sepsis MRSA bacteremia  -History of disseminated MRSA bacteremia with necrotizing pneumonia with parapenumonic effusion/pneumonia on Daptomycin on admission  Concern for osteomyelitis/ischemic toes  P: ID following, appreciate assistance  Continue IV abx per ID Aggressive IV hydration MAP< 65 Obtain / Trend lactic acid Procalcitonin Monitor urine output May use CVP to asses fluid status Persistent hypotension consider stress dose steroids (hydrocortisone 50q6 or 100q8) Palliative care consulted  Midodrine   Acute hypoxic respiratory failure  Cavitary PNA w/ loculated pleural effusion  - s/p OSH thoracentesis 6/11, previously treated with daptomycin which does not adequately treat lung infections P: -wean o2 for sats >92% - follow 6/11 pleural cultures from  [facility].  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  Holding amiodarone with elevated LFTs - holding pta Eliquis, and heparin subcu given need for blood transfusion today - holding BB, pre-admit hypertension meds with hypotension   Acute kidney injury  - baseline sCr 1.2 - CK ok (on daptomycin) -check renal US   Shock liver  P: - Trend LTFs  - Avoid hepatotoxins   Encephalopathy, toxic/ metabolic  P:  - supportive care as above - consider brain imaging given hx of disseminated bacteremia  - minimize sedating meds - monitor for airway protection   Acute on chronic anemia P:  - s/p 1u of PRBC 6/13 - trend CBC, transfuse for Hgb < 7  Best Practice (right click and "Reselect all SmartList Selections" daily)   Per primary  Labs   CBC: Recent Labs  Lab 11/28/22 2146 11/29/22 1826 11/30/22 0840 11/30/22 1641  WBC 5.3  --  5.8  --   NEUTROABS 3.8  --   --   --   HGB 7.9* 7.0* 8.0* 7.9*  HCT 27.8* 24.9* 27.7*  27.1*  MCV 102.6*  --  100.0  --   PLT 261  --  255  --     Basic Metabolic Panel: Recent Labs  Lab 11/28/22 2146 11/29/22 0906 11/30/22 0840  NA 140 139 139  K 3.8 4.1 3.7  CL 98 94* 95*  CO2 30 26 29   GLUCOSE 115* 133* 102*  BUN 26* 33* 38*  CREATININE 2.15* 2.61* 2.98*  CALCIUM 7.8* 8.0* 7.7*   GFR: Estimated Creatinine Clearance: 19.7 mL/min (A) (by C-G formula based on SCr of 2.98 mg/dL (H)). Recent Labs  Lab 11/28/22 2146 11/29/22 0906 11/29/22 1124 11/30/22 0840  WBC 5.3  --   --  5.8  LATICACIDVEN  --  2.8* 1.3  --     Liver Function Tests: Recent Labs  Lab 11/28/22 2146 11/29/22 0906 11/30/22 0840  AST 46* 354* 156*  ALT 19 161* 113*  ALKPHOS 72 116 94  BILITOT 0.8 1.2 1.1  PROT 5.8* 6.3* 5.5*  ALBUMIN 2.5* 2.6* 2.2*   No results for input(s): "LIPASE", "AMYLASE" in the last 168 hours. No  results for input(s): "AMMONIA" in the last 168 hours.  ABG    Component Value Date/Time   PHART 7.3 (L) 11/30/2022 1009   PCO2ART 62 (H) 11/30/2022 1009   PO2ART 89 11/30/2022 1009   HCO3 30.3 (H) 11/30/2022 1009   TCO2 29 10/06/2019 1515   ACIDBASEDEF 2.4 (H) 10/07/2013 0500   O2SAT NOT CALCULATED 11/30/2022 1009     Coagulation Profile: Recent Labs  Lab 11/29/22 0906  INR 1.5*    Cardiac Enzymes: Recent Labs  Lab 11/29/22 1606  CKTOTAL 384    HbA1C: Hgb A1c MFr Bld  Date/Time Value Ref Range Status  08/27/2013 04:40 AM 6.1 (H) <5.7 % Final    Comment:    (NOTE)                                                                       According to the ADA Clinical Practice Recommendations for 2011, when HbA1c is used as a screening test:  >=6.5%   Diagnostic of Diabetes Mellitus           (if abnormal result is confirmed) 5.7-6.4%   Increased risk of developing Diabetes Mellitus References:Diagnosis and Classification of Diabetes Mellitus,Diabetes Care,2011,34(Suppl 1):S62-S69 and Standards of Medical Care in         Diabetes - 2011,Diabetes Care,2011,34 (Suppl 1):S11-S61.  05/08/2013 05:32 AM 5.8 (H) <5.7 % Final    Comment:    (NOTE)                                                                       According to the ADA Clinical Practice Recommendations for 2011, when HbA1c is used as a screening test:  >=6.5%   Diagnostic of Diabetes Mellitus           (if abnormal result is confirmed) 5.7-6.4%   Increased risk of developing Diabetes Mellitus References:Diagnosis and Classification of  Diabetes Mellitus,Diabetes Care,2011,34(Suppl 1):S62-S69 and Standards of Medical Care in         Diabetes - 2011,Diabetes Care,2011,34 (Suppl 1):S11-S61.    CBG: No results for input(s): "GLUCAP" in the last 168 hours.  Review of Systems:   Review of Systems  Constitutional:  Positive for fever.  Respiratory:  Positive for cough. Negative for shortness of breath.    Cardiovascular:  Negative for chest pain.  Gastrointestinal:  Negative for abdominal pain, nausea and vomiting.     Past Medical History:  He,  has a past medical history of Acute on chronic diastolic heart failure (HCC) (06/27/1476), Acute on chronic respiratory failure with hypoxia (HCC) (10/03/2019), Acute on chronic systolic heart failure (HCC) (29/56/2130), Acute respiratory failure with hypoxia (HCC) (10/07/2013), AKI (acute kidney injury) (HCC) (12/17/2019), Aortic atherosclerosis (HCC) (05/05/2013), Aortic dissection (HCC) (05/04/2013), Aortic valve insufficiency, Arthritis, Atrial fibrillation (HCC) (05/21/2013), Atrial flutter (HCC), Bradycardia (08/23/2013), CAD (coronary artery disease), Chest pain (01/28/2015), CHF (congestive heart failure) (HCC), Chronic diastolic heart failure (HCC) (8/65/7846), Descending aortic aneurysm (HCC), DVT (deep venous thrombosis) (HCC), Dyspnea (09/2019), HCAP (healthcare-associated pneumonia) (10/11/2013), History of blood transfusion, HTN (hypertension), Hyperlipidemia, Hypotension (12/17/2019), Incarcerated left inguinal hernia s/p repair 12/02/2019 (12/01/2019), Insomnia (09/04/2013), Multiple fractures of ribs, right side, init for clos fx (09/04/2019), Near syncope (01/28/2015), Pressure injury of skin (10/06/2019), 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   Surgical History:   Past Surgical History:  Procedure Laterality Date   AORTIC VALVE REPLACEMENT  02/19/2009   AORTO-FEMORAL BYPASS GRAFT  03/2010   ascending aortic and arch aneurysm repair/notes 04/09/2009   CARDIAC VALVE REPLACEMENT     CAROTID-SUBCLAVIAN BYPASS GRAFT Left 08/26/2013   Procedure: BYPASS GRAFT CAROTID-SUBCLAVIAN;  Surgeon: Nada Libman, MD;  Location: [facility] OR;   Service: Vascular;  Laterality: Left;   CATARACT EXTRACTION W/ INTRAOCULAR LENS  IMPLANT, BILATERAL Bilateral    ENDOVASCULAR STENT INSERTION N/A 08/26/2013   Procedure:  THORACIC STENT GRAFT INSERTION;  Surgeon: Nada Libman, MD;  Location: [facility] OR;  Service: Vascular;  Laterality: N/A;   EYE SURGERY     INGUINAL HERNIA REPAIR Left 12/02/2019   Procedure: REPAIR LEFT INGUINAL HERNIA WITH MESH;  Surgeon: Violeta Gelinas, MD;  Location: [facility] OR;  Service: General;  Laterality: Left;   INGUINAL HERNIA REPAIR Left 12/18/2019   Procedure: LAPAROSCOPIC ASSISTED REPAIR OF RECURRENT INCARCERATED LEFT INGUINAL HERNIA WITH MESH; LAPAOSCOPIC REPAIR OF SEROSAL TEAR;  Surgeon: Gaynelle Adu, MD;  Location: [facility] OR;  Service: General;  Laterality: Left;   INSERTION OF MESH Left 12/02/2019   Procedure: INSERTION OF MESH;  Surgeon: Violeta Gelinas, MD;  Location: [facility] OR;  Service: General;  Laterality: Left;   JOINT REPLACEMENT     KNEE ARTHROSCOPY Bilateral    REPLACEMENT TOTAL KNEE Bilateral    right groin lymphocele Right 03/29/2009   RIGHT/LEFT HEART CATH AND CORONARY ANGIOGRAPHY N/A 10/06/2019   Procedure: RIGHT/LEFT HEART CATH AND CORONARY ANGIOGRAPHY;  Surgeon: Lyn Records, MD;  Location: [facility] INVASIVE CV LAB;  Service: Cardiovascular;  Laterality: N/A;   THORACENTESIS  2010 X 2   TONSILLECTOMY       Social History:   reports that he quit smoking about 48 years ago. His smoking use included cigarettes. He has never used smokeless tobacco. He reports that he does not drink alcohol and does not use drugs.  Family History:  His family history includes Aortic aneurysm in his father; Celiac disease in his daughter; Hypertension in his mother. There is no history of Heart attack or Stroke.   Allergies Allergies  Allergen Reactions   Lortab [Hydrocodone-Acetaminophen] Hives    No trouble breathing   Morphine And Codeine Hives   Other Other (See Comments)    Staples from surgery caused infection     Home  Medications  Prior to Admission medications   Medication Sig Start Date End Date Taking? Authorizing Provider  acetaminophen (TYLENOL) 500 MG tablet Take 2 tablets (1,000 mg total) by mouth every 6 (six) hours as needed. Patient taking differently: Take 1,000 mg by mouth every 6 (six) hours as needed for mild pain or moderate pain. 09/08/19  Yes Barnetta Chapel, PA-C  albuterol (PROVENTIL) (2.5 MG/3ML) 0.083% nebulizer solution Take by nebulization. 06/22/22  Yes [provider]  amiodarone (PACERONE) 200 MG tablet Take 1 tablet (200 mg total) by mouth daily. 07/12/22  Yes [name], MD  amoxicillin (AMOXIL) 500 MG capsule Take 4 capsules by mouth 30-60 min prior to dental procedure. Patient taking differently: Take 1,000 mg by mouth See admin instructions. Take 4 capsules by mouth 30-60 min prior to dental procedures 03/15/20  Yes [name], MD  apixaban (ELIQUIS) 2.5 MG TABS tablet Take 1 tablet (2.5 mg total) by mouth 2 (two) times daily. 11/15/22  Yes [name], MD  DAPTOmycin (CUBICIN) 500 MG injection Inject 500 mg into the vein daily. 11/28/22  Yes [provider]  ferrous sulfate (SV IRON) 325 (65 FE) MG tablet Take 1 tablet (325 mg total) by mouth daily with breakfast. 03/17/22  Yes [name], MD  furosemide (LASIX) 40 MG tablet Take 1 tablet (40 mg total) by mouth 2 (two) times daily as needed. Patient taking differently: Take 40 mg by mouth daily. 03/17/22  Yes [name], MD  lansoprazole (PREVACID) 30 MG capsule Take 30 mg by mouth daily. 08/13/19  Yes [provider]  levocetirizine (XYZAL) 5 MG tablet Take 5 mg by mouth daily. 04/21/21  Yes [provider]  metoprolol succinate (TOPROL-XL) 25 MG 24 hr tablet Take 25 mg by mouth daily.   Yes [provider]  Multiple Vitamin (MULTIVITAMIN WITH MINERALS) TABS tablet Take 1 tablet by mouth daily.   Yes [provider]  Multiple  Vitamins-Minerals (ZINC PO) Take 1 tablet by mouth daily at 6 (six) AM.   Yes [provider]  tamsulosin (FLOMAX) 0.4 MG CAPS capsule Take 0.4 mg by mouth.   Yes [provider]  traMADol (ULTRAM) 50 MG tablet Take 50 mg by mouth every 6 (six) hours as needed for moderate pain. 11/27/22  Yes [provider]         JD Anselm Lis [facility] 11/30/2022, 6:15 PM  Please see [email or web address] for pager details.  From 7A-7P if no response, please call [phone]. After hours, please call ELink [phone].

## 2022-11-30 NOTE — Progress Notes (Signed)
   11/30/22 2113  BiPAP/CPAP/SIPAP  BiPAP/CPAP/SIPAP Pt Type Adult  BiPAP/CPAP/SIPAP V60  Mask Type Full face mask  Mask Size Medium  Set Rate 18 breaths/min  Respiratory Rate 19 breaths/min  IPAP 12 cmH20  EPAP 6 cmH2O  FiO2 (%) 40 %  Minute Ventilation 8.9  Leak 7  Peak Inspiratory Pressure (PIP) 12  Tidal Volume (Vt) 500  Patient Home Equipment No  Auto Titrate No  Press High Alarm 25 cmH2O  Press Low Alarm 5 cmH2O  BiPAP/CPAP /SiPAP Vitals  Pulse Rate 93  Resp 20  SpO2 99 %  Bilateral Breath Sounds Diminished  MEWS Score/Color  MEWS Score 1  MEWS Score Color [name] [name]

## 2022-11-30 NOTE — Progress Notes (Signed)
[facility]    Date of Admission:  11/28/2022   Total days of antibiotics 3          ID: [name] [name] is a 82 y.o. 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 No excess work of breathing.  Medications:   amiodarone  200 mg Oral Daily   Chlorhexidine Gluconate Cloth  6 each Topical Daily   heparin  5,000 Units Subcutaneous Q8H   loratadine  10 mg Oral Daily   midodrine  2.5 mg Oral TID WC   pantoprazole  40 mg Oral Daily   tamsulosin  0.4 mg Oral Daily    Objective: Vital signs in last 24 hours: Temp:  [97 F (36.1 C)-99.4 F (37.4 C)] 98.7 F (37.1 C) (06/13 1300) Pulse Rate:  [86-107] 107 (06/13 1106) Resp:  [16-20] 19 (06/13 1106) BP: (89-105)/(51-84) 102/57 (06/13 1106) SpO2:  [99 %-100 %] 99 % (06/13 0746) Weight:  [85 kg] 85 kg (06/13 0258)  Physical Exam  Constitutional: He is oriented to person, only. He appears well-developed and well-nourished. No distress.  HENT:  Mouth/Throat: Oropharynx is clear and moist. No oropharyngeal exudate.  Cardiovascular: Normal rate, regular rhythm and normal heart sounds. Exam reveals no gallop and no friction rub.  No murmur heard.  Pulmonary/Chest: Effort normal and breath sounds normal. No respiratory distress. He has no wheezes.  Abdominal: Soft. Bowel sounds are normal. He exhibits no distension. There is no tenderness.  Ext: eschar to bilateral 2nd digit Neurological: He is alert and oriented to person, place, and time.  Skin: Skin is warm and dry. No rash noted. No erythema.  Psychiatric: He has a normal mood and affect. His  behavior is normal.    Lab Results Recent Labs    11/28/22 2146 11/29/22 0906 11/29/22 1826 11/30/22 0840  WBC 5.3  --   --  5.8  HGB 7.9*  --  7.0* 8.0*  HCT 27.8*  --  24.9* 27.7*  NA 140 139  --  139  K 3.8 4.1  --  3.7  CL 98 94*  --  95*  CO2 30 26  --  29  BUN 26* 33*  --  38*  CREATININE 2.15* 2.61*  --  2.98*   Liver Panel Recent Labs    11/29/22 0906 11/30/22 0840  PROT 6.3* 5.5*  ALBUMIN 2.6* 2.2*  AST 354* 156*  ALT 161* 113*  ALKPHOS 116 94  BILITOT 1.2 1.1    Microbiology: 6/12 blood cx ngtd Studies/Results: CT CHEST WO CONTRAST  Result Date: 11/29/2022 CLINICAL DATA:  Pneumonia.  Left thoracentesis 11/28/2022. EXAM: CT CHEST WITHOUT CONTRAST TECHNIQUE: Multidetector CT imaging of the chest was performed following the standard protocol without IV contrast. RADIATION DOSE REDUCTION: This exam was performed according to the departmental dose-optimization program which includes automated exposure control, adjustment of the mA and/or kV according to patient size and/or use of iterative reconstruction technique. COMPARISON:  11/27/2022. FINDINGS: Cardiovascular: Status post aortic valve replacement and tube graft repair of the ascending aorta as well as left carotid-subclavian bypass grafting.  Endovascular stent grafting of the aortic arch and descending thoracic aorta is again noted with the proximal portion of the stent graft covering the left subclavian artery origin. 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 Extensive multi-vessel coronary artery calcification. Stable cardiomegaly. No pericardial effusion. Central pulmonary arteries are enlarged in keeping with changes of pulmonary arterial hypertension. Right upper extremity PICC line tip is seen at the superior cavoatrial junction. Mediastinum/Nodes: Visualized thyroid is  unremarkable. Pathologic mediastinal adenopathy appears slightly progressive with the index lymph node measuring 19 mm in short axis diameter within the precarinal lymph node groups axial image # 56/3. The esophagus is unremarkable. Lungs/Pleura: Focal consolidation within the a right apex and scattered nodular infiltrate throughout the right upper and, to a lesser extent, left upper and right middle lobes are again identified in keeping with multifocal infection in the acute setting. Punctate areas of cavitation are identified within the right apex. Superimposed mild emphysema. Small left pleural effusion appears partially loculated within the sub pulmonic region and appears decreased in size since prior examination but not completely evacuated. There is left-sided volume loss with subtotal collapse of the lower lobe and segmental atelectasis of the lingula. Moderate dependently layering right pleural effusion is present. Compressive atelectasis of the dependent right lower lobe noted. No pneumothorax. No central obstructing lesion. Bronchial wall thickening noted in keeping with airway inflammation. Upper Abdomen: No acute abnormality. Musculoskeletal: No acute bone abnormality. No lytic or blastic bone lesion. IMPRESSION: 1. Status post aortic valve replacement and tube graft repair of the ascending aorta as well as left carotid-subclavian bypass grafting. Endovascular stent grafting of the aortic arch and descending thoracic aorta is again noted with the proximal portion of the stent graft covering the left subclavian artery origin. 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 2. Extensive multi-vessel coronary artery calcification. Stable cardiomegaly. 3. Morphologic changes in keeping with pulmonary arterial hypertension. 4. Multifocal pulmonary consolidation and nodular infiltrate in keeping with multifocal necrotizing infection. Findings appears stable since  prior examination. 5. Moderate dependently layering right pleural effusion. 6. Small left pleural effusion appears partially loculated within the sub pulmonic region and appears decreased in size since prior examination but not completely evacuated. Associated left-sided volume loss with subtotal collapse of the lower lobe and segmental atelectasis of the lingula. 7. Pathologic mediastinal adenopathy, slightly progressive since prior examination, likely reactive. 8. Mild emphysema. Aortic Atherosclerosis (ICD10-I70.0) and Emphysema (ICD10-J43.9). Electronically Signed   By: Helyn Numbers M.D.   On: 11/29/2022 02:05   DG CHEST PORT 1 VIEW  Result Date: 11/28/2022 CLINICAL DATA:  PICC verification. EXAM: PORTABLE CHEST 1 VIEW COMPARISON:  Radiograph earlier today, CT yesterday FINDINGS: Tip of the right upper extremity PICC projects over the lower SVC. Unchanged heart size and mediastinal contours, aortic stent graft. Left pleural effusion may have diminished slightly from earlier today. Associated compressive atelectasis. Patchy airspace disease in the left mid lung. Right pleural effusion may have increased. Peripheral opacity in the right upper lung zone. No pneumothorax. IMPRESSION: 1. Tip of the right upper extremity PICC projects over the lower SVC. 2. Shifting pleural effusions may be due to differences in positioning. 3. Bilateral airspace disease. Electronically Signed   By: Narda Rutherford M.D.   On: 11/28/2022 20:44     Assessment/Plan: Hx of recent disseminated  MRSA bacteremia with necrotizing pneumonia with parapneumonic effusion s/p large volume thoracentesis at outside hospital prior to this admission. 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 Our concern is that he had incomplete work up at outside hospital when he had initial MRSA bacteremia  Ischemic toes/osteomyelitis = agree with dr Lajoyce Corners for abi to assess for vascular status  Encephalopathy =  he previously improved with bipap which may need to be reinstituted.   [facility] Pager: [phone]  11/30/2022, 2:35 PM

## 2022-12-01 ENCOUNTER — Inpatient Hospital Stay (HOSPITAL_COMMUNITY): Payer: Medicare Other

## 2022-12-01 ENCOUNTER — Encounter (HOSPITAL_COMMUNITY)
Disposition: A | Discharge status: Rehabilitation Facility | Payer: Self-pay | Source: Other Acute Inpatient Hospital | Attending: Internal Medicine

## 2022-12-01 DIAGNOSIS — A419 Sepsis, unspecified organism: Secondary | ICD-10-CM | POA: Diagnosis not present

## 2022-12-01 DIAGNOSIS — I5021 Acute systolic (congestive) heart failure: Secondary | ICD-10-CM

## 2022-12-01 DIAGNOSIS — Z7189 Other specified counseling: Secondary | ICD-10-CM

## 2022-12-01 DIAGNOSIS — I5022 Chronic systolic (congestive) heart failure: Secondary | ICD-10-CM | POA: Diagnosis not present

## 2022-12-01 DIAGNOSIS — Z515 Encounter for palliative care: Secondary | ICD-10-CM

## 2022-12-01 DIAGNOSIS — Z66 Do not resuscitate: Secondary | ICD-10-CM

## 2022-12-01 DIAGNOSIS — Z952 Presence of prosthetic heart valve: Secondary | ICD-10-CM | POA: Diagnosis not present

## 2022-12-01 DIAGNOSIS — J90 Pleural effusion, not elsewhere classified: Secondary | ICD-10-CM | POA: Diagnosis not present

## 2022-12-01 DIAGNOSIS — R6521 Severe sepsis with septic shock: Secondary | ICD-10-CM

## 2022-12-01 DIAGNOSIS — I4821 Permanent atrial fibrillation: Secondary | ICD-10-CM | POA: Diagnosis not present

## 2022-12-01 LAB — BLOOD GAS, VENOUS
Acid-Base Excess: 1.6 mmol/L (ref 0.0–2.0)
Bicarbonate: 29.8 mmol/L — ABNORMAL HIGH (ref 20.0–28.0)
Drawn by: 58552
O2 Saturation: 96.1 %
Patient temperature: 36.8
pCO2, Ven: 62 mmHg — ABNORMAL HIGH (ref 44–60)
pH, Ven: 7.29 (ref 7.25–7.43)
pO2, Ven: 67 mmHg — ABNORMAL HIGH (ref 32–45)

## 2022-12-01 LAB — ECHOCARDIOGRAM COMPLETE
AR max vel: 2.12 cm2
AV Area VTI: 2.07 cm2
AV Area mean vel: 2.27 cm2
AV Mean grad: 9.5 mmHg
AV Peak grad: 16.1 mmHg
Ao pk vel: 2.01 m/s
Area-P 1/2: 4.31 cm2
Calc EF: 38.9 %
MV M vel: 5.31 m/s
MV Peak grad: 112.8 mmHg
Radius: 0.6 cm
S' Lateral: 4.9 cm
Single Plane A2C EF: 44.2 %
Single Plane A4C EF: 34.3 %
Weight: 3054.69 oz

## 2022-12-01 LAB — PROCALCITONIN: Procalcitonin: 1.7 ng/mL

## 2022-12-01 LAB — COMPREHENSIVE METABOLIC PANEL
ALT: 86 U/L — ABNORMAL HIGH (ref 0–44)
AST: 81 U/L — ABNORMAL HIGH (ref 15–41)
Albumin: 2.2 g/dL — ABNORMAL LOW (ref 3.5–5.0)
Alkaline Phosphatase: 85 U/L (ref 38–126)
Anion gap: 15 (ref 5–15)
BUN: 44 mg/dL — ABNORMAL HIGH (ref 8–23)
CO2: 24 mmol/L (ref 22–32)
Calcium: 7.6 mg/dL — ABNORMAL LOW (ref 8.9–10.3)
Chloride: 96 mmol/L — ABNORMAL LOW (ref 98–111)
Creatinine, Ser: 3.11 mg/dL — ABNORMAL HIGH (ref 0.61–1.24)
GFR, Estimated: 19 mL/min — ABNORMAL LOW (ref >60)
Glucose, Bld: 93 mg/dL (ref 70–99)
Potassium: 3.4 mmol/L — ABNORMAL LOW (ref 3.5–5.1)
Sodium: 135 mmol/L (ref 135–145)
Total Bilirubin: 1 mg/dL (ref 0.3–1.2)
Total Protein: 5.2 g/dL — ABNORMAL LOW (ref 6.5–8.1)

## 2022-12-01 LAB — CBC
HCT: 27 % — ABNORMAL LOW (ref 39.0–52.0)
Hemoglobin: 7.9 g/dL — ABNORMAL LOW (ref 13.0–17.0)
MCH: 29.8 pg (ref 26.0–34.0)
MCHC: 29.3 g/dL — ABNORMAL LOW (ref 30.0–36.0)
MCV: 101.9 fL — ABNORMAL HIGH (ref 80.0–100.0)
Platelets: 241 10*3/uL (ref 150–400)
RBC: 2.65 MIL/uL — ABNORMAL LOW (ref 4.22–5.81)
RDW: 18.5 % — ABNORMAL HIGH (ref 11.5–15.5)
WBC: 4.3 10*3/uL (ref 4.0–10.5)
nRBC: 0 % (ref 0.0–0.2)

## 2022-12-01 LAB — LACTIC ACID, PLASMA: Lactic Acid, Venous: 0.9 mmol/L (ref 0.5–1.9)

## 2022-12-01 LAB — CULTURE, BLOOD (ROUTINE X 2)

## 2022-12-01 LAB — MAGNESIUM: Magnesium: 2.3 mg/dL (ref 1.7–2.4)

## 2022-12-01 LAB — OCCULT BLOOD X 1 CARD TO LAB, STOOL: Fecal Occult Bld: NEGATIVE

## 2022-12-01 SURGERY — ECHOCARDIOGRAM, TRANSESOPHAGEAL
Anesthesia: Monitor Anesthesia Care

## 2022-12-01 MED ORDER — SODIUM CHLORIDE 0.9% FLUSH
10.0000 mL | INTRAVENOUS | Status: DC | PRN
  Administered 2022-12-03: 10 mL

## 2022-12-01 MED ORDER — SODIUM CHLORIDE 0.9% FLUSH
10.0000 mL | Freq: Two times a day (BID) | INTRAVENOUS | Status: DC
  Administered 2022-12-01 – 2022-12-05 (×8): 10 mL
  Administered 2022-12-06: 20 mL
  Administered 2022-12-06 – 2022-12-08 (×4): 10 mL

## 2022-12-01 MED ORDER — MUPIROCIN 2 % EX OINT
1.0000 | TOPICAL_OINTMENT | Freq: Two times a day (BID) | CUTANEOUS | Status: AC
  Administered 2022-12-01 – 2022-12-05 (×10): 1 via NASAL
  Filled 2022-12-01 (×3): qty 22

## 2022-12-01 MED ORDER — AMIODARONE HCL 200 MG PO TABS
200.0000 mg | ORAL_TABLET | Freq: Every day | ORAL | Status: DC
  Administered 2022-12-02 – 2022-12-08 (×7): 200 mg via ORAL
  Filled 2022-12-01 (×7): qty 1

## 2022-12-01 MED ORDER — ASPIRIN 81 MG PO CHEW
81.0000 mg | CHEWABLE_TABLET | Freq: Every day | ORAL | Status: DC
  Administered 2022-12-01 – 2022-12-08 (×8): 81 mg via ORAL
  Filled 2022-12-01 (×8): qty 1

## 2022-12-01 MED ORDER — CHLORHEXIDINE GLUCONATE CLOTH 2 % EX PADS
6.0000 | MEDICATED_PAD | Freq: Every day | CUTANEOUS | Status: DC
  Administered 2022-12-01 – 2022-12-08 (×8): 6 via TOPICAL

## 2022-12-01 NOTE — Progress Notes (Signed)
Progress Note   Patient: [name] [name] ZOX:[id number] DOB: [birth year] DOA: 11/28/2022     3 DOS: the patient was seen and examined on 12/01/2022   Brief hospital course: 82 year old male prior history of chronic systolic heart failure EF of 40 to 45%, history of bioprosthetic aortic valve replacement, history of aortic aneurysm, peripheral vascular disease, chronic A-fib on Eliquis, chronic wounds on his bilateral lower extremities, history of anemia, presents to the ER at [facility] yesterday for hypoxia.  He had been seen in wound care clinic and was noted to have a room air O2 saturations of 82%.  He was sent to the ER.   Last month, he was admitted to [facility] where he was diagnosed with MRSA pneumonia and septicemia.  Unclear if he had a transesophageal echocardiogram done.  He was discharged home on IV Cubicin for his septicemia and p.o. doxycycline for his MRSA pneumonia.  Reportedly he had completed his doxycycline for his MRSA pneumonia and had been continued on IV Cubicin.  in the [facility] ER, he had a CT scan which demonstrated a large loculated left-sided pleural effusion. He also had a small right pleural effusion. He also had a cavitary lesion in his right upper lobe that was consistent with his history of MRSA pneumonia. He underwent thoracentesis yielding 900cc of fluid  Pt was transferred to [facility] for formal ID evaluation  Assessment and Plan: Loculated pleural effusion with cavitary PNA and MRSA bacteremia with severe sepsis with septic shock present on admit -RH documents 900 ml of pleural fluid removed from left side thoracentesis.  -Repeat CT chest reviewed. Moderate R layering effusion and small small residual L effusion with multifocal necrotizing PNA -Initially hypotensive with elevated lactate, ARF.  -Currently on midodrine 10mg  tid. Fluids now stopped secondary to overload -ID following. 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 Doubt pt would be  able to tolerate TEE at this time. ID therefore recommends prolonged suppression with doxycycline 100mg  bid after discharge - Pt is continued on zyvox for now   Acute respiratory failure with hypoxia (HCC) Secondary to his pleural effusion and cavitary PNA -Cont o2 and wean as tolerated -On bipap this AM. Slight elevation in co2 on VBG noted -Attempt at weaning O2 as tolerated -Appreciate PCCM assistance   R heel stage 3 pressure ulcer, present on admit -reportedly had been planned for outpatient bilateral 2nd toe amputations at [facility] which had been canceled as pt is now in hospital.  -Have reached out to Dr. Lajoyce Corners. Appreciate input. Per Ortho, suspicion that pt does not have sufficient circulation to amputate the 2nd toes.  -ABIs ordered per Dr. Lajoyce Corners   Atrial fibrillation [facility]) [name] eliquis had been on hold in anticipation for planned amputation  -toprolol XL held secondary to hypotension -remains rate controlled at this time   Acute on Chronic systolic CHF (congestive heart failure) (HCC) Elevated pro-BNP at [facility] to 9870. Presenting BNP 1,270 -no LE edema. No evidence of pulm edema on chest CT -lasix on hold secondary to hypotension -Appreciate input by cardiology. Repeat echo with EF essentially unchanged -basal IVF now stopped given concerns of vol overload   S/P AVR (aortic valve replacement) Stable.   MRSA bacteremia with severe sepsis and septic shock present on admit -Pt has been on IV cubicin at home for MRSA septicemia.  -Reportedly pt had developed renal insufficiency when on IV vancomycin. Had been on po doxycycline at home and reportedly had completed this course. -Currently on zyvox  Normocytic anemia -Without obvious evidence of bleeding at this tiem -Hgb trends had been trending down, required 1 unit of PRBC on 6/12 with appropriate rise in hgb -stool is heme neg  Hypotension secondary to septic shock -Hold BP meds and diuretics -now on midodrine -basal IVF now  stopped given concern of vol overload -recheck bmet in AM  ARF -Cr of 1.2 on 06/23/22.  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 Discussed with Palliative Care      Subjective: Remained on bipap this AM, not very conversant. Nodded "yes" when asked if he feels better today  Physical Exam: Vitals:   12/01/22 1201 12/01/22 1348 12/01/22 1430 12/01/22 1627  BP: (!) 100/54 121/68  (!) 111/58  Pulse: 90  87 88  Resp: 16  (!) 25 18  Temp: 98 F (36.7 C)     TempSrc: Axillary   Oral  SpO2: 100%  100% 100%  Weight:       General exam: Awake, on bipap, laying in bed, in nad Respiratory system: Increased respiratory effort, no wheezing Cardiovascular system: regular rate, s1, s2 Gastrointestinal system: Soft, nondistended, positive BS Central nervous system: CN2-12 grossly intact, strength intact Extremities: Perfused, no clubbing Skin: Normal skin turgor, no notable skin lesions seen Psychiatry: Unable to assess given mentation  Data Reviewed:  Labs reviewed: Na 135, K 3.4, Cr 3.11, AST 81, ALT 86, WBC 4.3, Hgb 7.9   Family Communication: Pt in room, family at bedside  Disposition: Status is: Inpatient Continue inpatient stay because: Severity of illness  Planned Discharge Destination:  Unclear at this time     Author: Rickey Barbara, MD 12/01/2022 4:35 PM  For on call review [email or web address].

## 2022-12-01 NOTE — Progress Notes (Signed)
Pt on 5lt salter.doing well at this time

## 2022-12-01 NOTE — Progress Notes (Signed)
Initial Nutrition Assessment  DOCUMENTATION CODES:   Not applicable  INTERVENTION:  - Add Ensure Enlive po BID, each supplement provides 350 kcal and 20 grams of protein. (Once diet advances)   NUTRITION DIAGNOSIS:   Inadequate oral intake related to inability to eat as evidenced by NPO status.  GOAL:   Patient will meet greater than or equal to 90% of their needs  MONITOR:   PO intake, Diet advancement  REASON FOR ASSESSMENT:   Malnutrition Screening Tool    ASSESSMENT:   82 y.o. male admits related to hypoxia. PMH includes: CHF, respiratory failure, AKI, afib, CAD, CHF, HTN, HLD. Pt is currently receiving medical management related to loculated pleural effusion with cavitary PNA and MRSA bacteremia with severe sepsis with septic shock.  Meds reviewed. Labs reviewed: K low, BUN/creatinine elevated.   Pt is out of room at time of assessment. Pt is currently NPO and previously on heart healthy diet. No PO intakes noted per record. No significant wt loss per record. RD will add supplements once diet advances. Palliative care has been consulted. RD will continue to monitor for PO intakes and POC.   NUTRITION - FOCUSED PHYSICAL EXAM:  Pt out of room.   Diet Order:   Diet Order             Diet NPO time specified  Diet effective now                   EDUCATION NEEDS:   Not appropriate for education at this time  Skin:  Skin Assessment: Skin Integrity Issues: Skin Integrity Issues:: Stage III Stage III: R heel  Last BM:  12/01/22  Height:   Ht Readings from Last 1 Encounters:  06/23/22 5' 10.5" (1.791 m)    Weight:   Wt Readings from Last 1 Encounters:  12/01/22 86.6 kg    Ideal Body Weight:     BMI:  Body mass index is 27.01 kg/m.  Estimated Nutritional Needs:   Kcal:  2165-2600 kcals  Protein:  100-130 gm  Fluid:  </= 2 L or per MD (pt with CHF)  Dewan Emond [place] Bing, RD, LDN, CNSC.

## 2022-12-01 NOTE — Care Management Important Message (Signed)
Important Message  Patient Details  Name: [name] [name] MRN: [id number] Date of Birth: 12/22/1940   Medicare Important Message Given:  Yes     Sherilyn Banker 12/01/2022, 4:33 PM

## 2022-12-01 NOTE — Consult Note (Signed)
Palliative Medicine Inpatient Consult Note  Consulting Provider: Steffanie Dunn, DO   Reason for consult:   Palliative Care Consult Services Palliative Medicine Consult  Reason for Consult? advanced disease, progressive renal failure, advanced age   83/14[birth year]2024  HPI:  Per intake H&P --> 82 year old male prior history of chronic systolic heart failure EF of 40 to 45%, history of bioprosthetic aortic valve replacement, history of aortic aneurysm, peripheral vascular disease, chronic A-fib on Eliquis, chronic wounds on his bilateral lower extremities, history of anemia. Complicated medical course since May 13th in the setting of his MRSA pneumonia. Transferred from [facility] for additional ID support. The PMT was consulted to further assist with goals of care conversations in the setting of acute illness.   Clinical Assessment/Goals of Care:  *Please note that this is a verbal dictation therefore any spelling or grammatical errors are due to the "Dragon Medical One" system interpretation.  I have reviewed medical records including EPIC notes, labs and imaging, received report from bedside RN, assessed the patient who is lying in bed on bipap in NAD.    I met with patients spouse, [name] and daughter, [name] to further discuss diagnosis prognosis, GOC, EOL wishes, disposition and options.   I introduced Palliative Medicine as specialized medical care for people living with serious illness. It focuses on providing relief from the symptoms and stress of a serious illness. The goal is to improve quality of life for both the patient and the family.  Medical History Review and Understanding:  A review of Sturms past medical history was completed according to his chart. [name] [name] has a history significant for heart failure, aortic valve replacement, aortic aneurysm status post 2 repairs, peripheral vascular disease, atrial fibrillation, and anemia.  Social History:  [name] [name] is from [place]  originally.  He and his wife moved here over a decade ago.  He has been married to his wife [name] for greater than 60 years.  They have 1 surviving daughter [name] as one of their children died at the age of 3 from endocardial fibroelastosis, and they also lost a son in the last few years.  Niclas formally worked at [employer] in the [employer], he retired in 1995.  He is a man of faith and practices within the [facility] [employer] [facility] denomination.  Functional and Nutritional State:  Preceding May 13 Octavis was fully ambulatory and functional.  He did have some gait instability that and his wife shares she would take him to places like the thrift store where he would be able to mobilize with a buggy.  Whenever he was home alone he was asked to use assistive walking aids.  He was able to help with his own BADLs.  His nutritional input was decent up until about 4 days ago when he had no desire to eat or drink anything.  Advance Directives:  A detailed discussion was had today regarding advanced directives.    Code Status:  Concepts specific to code status, artifical feeding and hydration, continued IV antibiotics and rehospitalization was had.  The difference between a aggressive medical intervention path  and a palliative comfort care path for this patient at this time was had.   Discussions held regarding escalation of care and cardiopulmonary resuscitation status.  Patient's wife recognizes if she is to put him through cardiopulmonary resuscitation he would not fare well in the setting of his prior aortic aneurysm replacement as well as his frail state.  [name] is very familiar with resuscitation events that she  was a Dentist for over 46 years and did a little bit of everything in her prior work setting.  We did discuss if escalation to the intensive care unit were necessary whether or not family would desire for the pursuit of that.  At this point in time they want to optimize and maximize care on 6  E. and if things did evolve into requiring ICU level of care family would not desire to proceed with that.  Discussion:  We discussed [name] [name]'s present clinical state.  Patient's wife and daughter are aware that he is in a tenuous position due to his severe sepsis from MRSA infection.  Patient's family is aware of [name] [name]'s multisystem organ dysfunction.  Despite [name] [name]'s present clinical condition there is hope for improvement.  We discussed continuing present measures and evaluating for Polino outcomes on a daily basis.   I shared that palliative care would continue to be involved to help support both [name] [name] and his family during this time.  Discussed the importance of continued conversation with family and their  medical providers regarding overall plan of care and treatment options, ensuring decisions are within the context of the patients values and GOCs.  Decision Maker: [name] [name] [phone]  [phone]   SUMMARY OF RECOMMENDATIONS   DNAR/DNI, no escalation to ICU level of care though optimize maximal support on 6E  Allow time for outcomes and treat the treatable  Ongoing palliative medicine team support  Code Status/Advance Care Planning: DNAR/DNI  Palliative Prophylaxis:  Aspiration, Bowel Regimen, Delirium Protocol, Frequent Pain Assessment, Oral Care, Palliative Wound Care, and Turn Reposition  Additional Recommendations (Limitations, Scope, Preferences): Continue present scope of care  Psycho-social/Spiritual:  Desire for further Chaplaincy support: Yes Additional Recommendations: Reviewed with patient's family's multisystem organ dysfunction which they are well aware of 3 speaking to both the pulmonologist, cardiologist, and hospitalist   Prognosis: Patient is in a tenuous clinical position right now and is unclear which direction things will go  Discharge Planning: Discharge plan is uncertain at this time  Vitals:   12/01/22 1348 12/01/22 1430  BP:  121/68   Pulse:  87  Resp:  (!) 25  Temp:    SpO2:  100%    Intake/Output Summary (Last 24 hours) at 12/01/2022 1600 Last data filed at 12/01/2022 0500 Gross per 24 hour  Intake 989.13 ml  Output 250 ml  Net 739.13 ml   Last Weight  Most recent update: 12/01/2022  4:04 AM    Weight  86.6 kg (190 lb 14.7 oz)            Gen: Elderly Caucasian male on BiPAP HEENT: Dry mucous membranes CV: Regular rate and irregular rhythm PULM: On BiPAP ABD:  nondistended  EXT: No edema  Neuro: Somnolent though does arouse-hearing impaired  PPS: 20%   This conversation/these recommendations were discussed with patient primary care team, Dr. Rhona Leavens  Billing based on MDM: High  Problems Addressed: One acute or chronic illness or injury that poses a threat to life or bodily function  Amount and/or Complexity of Data: Category 3:Discussion of management or test interpretation with external physician/other qualified health care professional/appropriate source (not separately reported)  Risks: Decision regarding hospitalization or escalation of hospital care and Decision not to resuscitate or to de-escalate care because of poor prognosis ______________________________________________________ Lamarr Lulas [facility] Palliative Medicine Team Team Cell Phone: [phone] Please utilize secure chat with additional questions, if there is no response within 30 minutes please call the above phone number  Palliative Medicine Team providers are available by phone from 7am to 7pm daily and can be reached through the team cell phone.  Should this patient require assistance outside of these hours, please call the patient's attending physician.

## 2022-12-01 NOTE — Progress Notes (Addendum)
Patient ID: [name] [name], male   DOB: Aug 17, 1940, 82 y.o.   MRN: [id number] Patient's ABIs shows severe peripheral vascular disease.  Patient would not be a candidate for surgical intervention of the second toes.  Amputation of the second toes would not heal.  I would continue with the protective boots and wound care for both feet.  If patient significantly improves then evaluation by vascular vein surgery would be the next option for his lower extremities.Marland Kitchen

## 2022-12-01 NOTE — Progress Notes (Signed)
Echocardiogram 2D Echocardiogram has been performed.  Warren Lacy Ryleeann Urquiza RDCS 12/01/2022, 9:24 AM

## 2022-12-01 NOTE — Progress Notes (Signed)
Pt taken off BIPAP and placed on 5L salter. Pt is tolerating well and vitals stable. RN aware. RT will monitor.

## 2022-12-01 NOTE — Progress Notes (Addendum)
Rounding Note    Patient Name: [name] [name] Date of Encounter: 12/01/2022  [facility] Cardiologist: Lance Muss, MD   Subjective   Required critical care consultation with transition to BIPAP overnight.  A&O x 1.  Increased respiratory effort.   Inpatient Medications    Scheduled Meds:  amiodarone  200 mg Oral Daily   Chlorhexidine Gluconate Cloth  6 each Topical Q0600   heparin  5,000 Units Subcutaneous Q8H   loratadine  10 mg Oral Daily   midodrine  10 mg Oral TID WC   mupirocin ointment  1 Application Nasal BID   pantoprazole  40 mg Oral Daily   sodium chloride flush  10-40 mL Intracatheter Q12H   tamsulosin  0.4 mg Oral Daily   Continuous Infusions:  linezolid (ZYVOX) IV 600 mg (12/01/22 0850)   PRN Meds: acetaminophen **OR** acetaminophen, albuterol, ondansetron **OR** ondansetron (ZOFRAN) IV, sodium chloride flush, traMADol   Vital Signs    Vitals:   12/01/22 0306 12/01/22 0401 12/01/22 0743 12/01/22 0855  BP:  122/60 (!) 114/55   Pulse:  96 79 90  Resp:  (!) 22 16 18   Temp:  98.3 F (36.8 C) 98 F (36.7 C)   TempSrc:  Axillary Axillary   SpO2: 99% 100% 100% 100%  Weight:  86.6 kg      Intake/Output Summary (Last 24 hours) at 12/01/2022 1022 Last data filed at 12/01/2022 0500 Gross per 24 hour  Intake 989.13 ml  Output 250 ml  Net 739.13 ml      12/01/2022    4:01 AM 11/30/2022    2:58 AM 11/29/2022    4:23 AM  Last 3 Weights  Weight (lbs) 190 lb 14.7 oz 187 lb 6.3 oz 190 lb 14.7 oz  Weight (kg) 86.6 kg 85 kg 86.6 kg      Telemetry    Atrial fibrillation with rates in the 80s to 90s.- Personally Reviewed  ECG    Atrial fibrillation, heart rate 94 - Personally Reviewed  Physical Exam   GEN: A&O x 1, ill appearing. Only able to follow simple commands.  Neck: mild JVD Cardiac: Irregularly irregular. 2/6 murmur respiratory: Clear to auscultation bilaterally.  Increased work of breathing.  Diminished breath sounds at  the bases. GI: Soft, nontender, non-distended  MS: No edema; No deformity. Neuro: unable to assess.  Psych: not able to assess  Labs    High Sensitivity Troponin:  No results for input(s): "TROPONINIHS" in the last 720 hours.   Chemistry Recent Labs  Lab 11/29/22 0906 11/30/22 0840 12/01/22 0450  NA 139 139 135  K 4.1 3.7 3.4*  CL 94* 95* 96*  CO2 26 29 24   GLUCOSE 133* 102* 93  BUN 33* 38* 44*  CREATININE 2.61* 2.98* 3.11*  CALCIUM 8.0* 7.7* 7.6*  MG  --   --  2.3  PROT 6.3* 5.5* 5.2*  ALBUMIN 2.6* 2.2* 2.2*  AST 354* 156* 81*  ALT 161* 113* 86*  ALKPHOS 116 94 85  BILITOT 1.2 1.1 1.0  GFRNONAA 24* 20* 19*  ANIONGAP 19* 15 15    Lipids No results for input(s): "CHOL", "TRIG", "HDL", "LABVLDL", "LDLCALC", "CHOLHDL" in the last 168 hours.  Hematology Recent Labs  Lab 11/28/22 2146 11/29/22 1826 11/30/22 0840 11/30/22 1641 12/01/22 0450  WBC 5.3  --  5.8  --  4.3  RBC 2.71*  --  2.77*  --  2.65*  HGB 7.9*   < > 8.0* 7.9* 7.9*  HCT  27.8*   < > 27.7* 27.1* 27.0*  MCV 102.6*  --  100.0  --  101.9*  MCH 29.2  --  28.9  --  29.8  MCHC 28.4*  --  28.9*  --  29.3*  RDW 18.6*  --  18.6*  --  18.5*  PLT 261  --  255  --  241   < > = values in this interval not displayed.   Thyroid No results for input(s): "TSH", "FREET4" in the last 168 hours.  BNP Recent Labs  Lab 11/28/22 2146  BNP 1,270.5*    DDimer No results for input(s): "DDIMER" in the last 168 hours.   Radiology    ECHOCARDIOGRAM COMPLETE  Result Date: 12/01/2022    ECHOCARDIOGRAM REPORT   Patient Name:   [name] [name] Date of Exam: 12/01/2022 Medical Rec #:  [id number]         Height:       70.5 in Accession #:    [id number]        Weight:       190.9 lb Date of Birth:  [birth year]         BSA:          2.057 m Patient Age:    82 years          BP:           102/68 mmHg Patient Gender: M                 HR:           85 bpm. Exam Location:  Inpatient Procedure: 2D Echo, Color Doppler and Cardiac  Doppler Indications:    I50.21 Acute systolic (congestive) heart failure  History:        Patient has prior history of Echocardiogram examinations, most                 recent 11/02/2022. CHF, CAD, Arrythmias:Atrial Fibrillation and                 Atrial Flutter; Risk Factors:Hypertension. 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                 Aortic Valve: unknown bioprosthetic valve is present in the                 aortic position. Procedure Date: 2010.  Sonographer:    Irving Burton Senior RDCS Referring Phys: [id number] Corrin Parker  Sonographer Comments: Scanned upright on BiPap IMPRESSIONS  1. Left ventricular ejection fraction, by estimation, is 35 to 40%. The left ventricle has moderately decreased function. The left ventricle demonstrates global hypokinesis. There is mild asymmetric left ventricular hypertrophy of the septal segment. Left ventricular diastolic parameters are consistent with Grade III diastolic dysfunction (restrictive).  2. Right ventricular systolic function is moderately reduced. The right ventricular size is moderately enlarged. There is moderately elevated pulmonary artery systolic pressure. The estimated right ventricular systolic pressure is 56.5 mmHg.  3. Left atrial size was severely dilated.  4. Right atrial size was severely dilated.  5. The mitral valve is abnormal. Moderate mitral valve regurgitation. Moderate mitral annular calcification.  6. The tricuspid valve is abnormal. Tricuspid valve regurgitation is moderate.  7. 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 The aortic valve has been repaired/replaced. Aortic valve regurgitation is not visualized. There is a unknown bioprosthetic  valve present in the aortic position. Procedure Date: 2010. Echo findings are consistent with normal structure and function of the aortic valve prosthesis. Aortic valve mean gradient measures 9.5 mmHg.  8. The pulmonic valve was abnormal.  9. The inferior vena  cava is dilated in size with <50% respiratory variability, suggesting right atrial pressure of 15 mmHg. FINDINGS  Left Ventricle: Left ventricular ejection fraction, by estimation, is 35 to 40%. The left ventricle has moderately decreased function. The left ventricle demonstrates global hypokinesis. The left ventricular internal cavity size was normal in size. There is mild asymmetric left ventricular hypertrophy of the septal segment. Left ventricular diastolic parameters are consistent with Grade III diastolic dysfunction (restrictive). Right Ventricle: The right ventricular size is moderately enlarged. No increase in right ventricular wall thickness. Right ventricular systolic function is moderately reduced. There is moderately elevated pulmonary artery systolic pressure. The tricuspid  regurgitant velocity is 3.22 m/s, and with an assumed right atrial pressure of 15 mmHg, the estimated right ventricular systolic pressure is 56.5 mmHg. Left Atrium: Left atrial size was severely dilated. Right Atrium: Right atrial size was severely dilated. Pericardium: There is no evidence of pericardial effusion. Mitral Valve: The mitral valve is abnormal. There is mild calcification of the mitral valve leaflet(s). Moderate mitral annular calcification. Moderate mitral valve regurgitation. MV peak gradient, 105.3 mmHg. Tricuspid Valve: The tricuspid valve is abnormal. Tricuspid valve regurgitation is moderate. Aortic Valve: Post AVR valve type unknown no PVL and normal gradients. The aortic valve has been repaired/replaced. Aortic valve regurgitation is not visualized. Aortic valve mean gradient measures 9.5 mmHg. Aortic valve peak gradient measures 16.1 mmHg.  Aortic valve area, by VTI measures 2.07 cm. There is a unknown bioprosthetic valve present in the aortic position. Procedure Date: 2010. Echo findings are consistent with normal structure and function of the aortic valve prosthesis. Pulmonic Valve: The pulmonic valve was  abnormal. Pulmonic valve regurgitation is mild to moderate. Aorta: The aortic root and ascending aorta are structurally normal, with no evidence of dilitation. Venous: The inferior vena cava is dilated in size with less than 50% respiratory variability, suggesting right atrial pressure of 15 mmHg. IAS/Shunts: No atrial level shunt detected by color flow Doppler.  LEFT VENTRICLE PLAX 2D LVIDd:         5.90 cm LVIDs:         4.90 cm LV PW:         1.00 cm LV IVS:        1.20 cm LVOT diam:     2.20 cm LV SV:         82 LV SV Index:   40 LVOT Area:     3.80 cm  LV Volumes (MOD) LV vol d, MOD A2C: 140.0 ml LV vol d, MOD A4C: 134.0 ml LV vol s, MOD A2C: 78.1 ml LV vol s, MOD A4C: 88.0 ml LV SV MOD A2C:     61.9 ml LV SV MOD A4C:     134.0 ml LV SV MOD BP:      54.2 ml RIGHT VENTRICLE RV S prime:     5.66 cm/s TAPSE (M-mode): 1.1 cm LEFT ATRIUM              Index        RIGHT ATRIUM           Index LA diam:        5.70 cm  2.77 cm/m   RA Area:     43.40 cm  LA Vol (A2C):   135.0 ml 65.62 ml/m  RA Volume:   191.00 ml 92.84 ml/m LA Vol (A4C):   142.0 ml 69.02 ml/m LA Biplane Vol: 138.0 ml 67.08 ml/m  AORTIC VALVE AV Area (Vmax):    2.12 cm AV Area (Vmean):   2.27 cm AV Area (VTI):     2.07 cm AV Vmax:           200.50 cm/s AV Vmean:          147.000 cm/s AV VTI:            0.396 m AV Peak Grad:      16.1 mmHg AV Mean Grad:      9.5 mmHg LVOT Vmax:         112.00 cm/s LVOT Vmean:        87.700 cm/s LVOT VTI:          0.216 m LVOT/AV VTI ratio: 0.55  AORTA Ao Root diam: 3.20 cm Ao Asc diam:  3.70 cm MITRAL VALVE                  TRICUSPID VALVE MV Area (PHT): 4.31 cm       TR Peak grad:   41.5 mmHg MV Peak grad:  105.3 mmHg     TR Vmax:        322.00 cm/s MV Vmax:       5.13 m/s MR Peak grad:    112.8 mmHg   SHUNTS MR Mean grad:    72.0 mmHg    Systemic VTI:  0.22 m MR Vmax:         531.00 cm/s  Systemic Diam: 2.20 cm MR Vmean:        406.0 cm/s MR PISA:         2.26 cm MR PISA Eff ROA: 16 mm MR PISA Radius:  0.60  cm MV E velocity: 104.00 cm/s Charlton Haws MD Electronically signed by Charlton Haws MD Signature Date/Time: 12/01/2022/10:04:19 AM    Final    US RENAL  Result Date: 11/30/2022 CLINICAL DATA:  Acute kidney injury EXAM: RENAL / URINARY TRACT ULTRASOUND COMPLETE COMPARISON:  11/28/2022 FINDINGS: Right Kidney: Renal measurements: 10.4 x 5.2 x 4.6 cm = volume: 131 mL. Echogenicity within normal limits. No mass or hydronephrosis visualized. Left Kidney: Renal measurements: 11.1 x 4.9 x 4.1 cm = volume: 116 mL. Echogenicity within normal limits. No mass or hydronephrosis visualized. Bladder: Appears normal for degree of bladder distention. Other: Right pleural effusion partially visualized. IMPRESSION: No acute findings.  No hydronephrosis. Right pleural effusion. Electronically Signed   By: Charlett Nose M.D.   On: 11/30/2022 22:19   VAS [place] ABI WITH/WO TBI  Result Date: 11/30/2022  LOWER EXTREMITY DOPPLER STUDY Patient Name:  [name] [name]  Date of Exam:   11/30/2022 Medical Rec #: [id number]          Accession #:    [id number] Date of Birth: 1941/01/01          Patient Gender: M Patient Age:   23 years Exam Location:  [facility] Procedure:      VAS [place] ABI WITH/WO TBI Referring Phys: MARCUS DUDA --------------------------------------------------------------------------------  Indications: Rest pain, ulceration, and peripheral artery disease. High Risk Factors: Hypertension, hyperlipidemia, coronary artery disease. Other Factors: Aortic valve replacement replacement of aortic root and ascending                aorta and replacement of aortic arch to  the left subclavian                artery. 2010.  Comparison Study: No prior study on file Performing Technologist: Sherren Kerns RVS  Examination Guidelines: A complete evaluation includes at minimum, Doppler waveform signals and systolic blood pressure reading at the level of bilateral brachial, anterior tibial, and posterior tibial arteries, when vessel  segments are accessible. Bilateral testing is considered an integral part of a complete examination. Photoelectric Plethysmograph (PPG) waveforms and toe systolic pressure readings are included as required and additional duplex testing as needed. Limited examinations for reoccurring indications may be performed as noted.  ABI Findings: +---------+------------------+-----+-------------------+---------+ Right    Rt Pressure (mmHg)IndexWaveform           Comment   +---------+------------------+-----+-------------------+---------+ Brachial                        multiphasic        PICC line +---------+------------------+-----+-------------------+---------+ PTA      55                0.54 dampened monophasic          +---------+------------------+-----+-------------------+---------+ DP       48                0.48 dampened monophasic          +---------+------------------+-----+-------------------+---------+ Great Toe0                 0.00 Absent                       +---------+------------------+-----+-------------------+---------+ +---------+------------------+-----+-------------------+-------+ Left     Lt Pressure (mmHg)IndexWaveform           Comment +---------+------------------+-----+-------------------+-------+ Brachial 101                    multiphasic                +---------+------------------+-----+-------------------+-------+ PTA      58                0.57 dampened monophasic        +---------+------------------+-----+-------------------+-------+ DP       0                 0.00 absent                     +---------+------------------+-----+-------------------+-------+ Great Toe0                 0.00 Absent                     +---------+------------------+-----+-------------------+-------+ +-------+-----------+-----------+------------+------------+ ABI/TBIToday's ABIToday's TBIPrevious ABIPrevious TBI  +-------+-----------+-----------+------------+------------+ Right  0.54       absent                              +-------+-----------+-----------+------------+------------+ Left   0.57       absent                              +-------+-----------+-----------+------------+------------+  Summary: Right: Resting right ankle-brachial index indicates moderate right lower extremity arterial disease. The right toe-brachial index is abnormal. Left: Resting left ankle-brachial index indicates moderate left lower extremity arterial disease. The left toe-brachial index is abnormal. *See table(s) above for measurements and observations.  Suggest Peripheral Vascular  Consult.    Preliminary     Cardiac Studies   Right/ Left Cardiac Catheterization 10/06/2019: Chronic total occlusion of the mid right coronary, collateralized right to right and left to right. Widely patent left main. Widely patent LAD with moderate luminal irregularities proximal to distal. Widely patent stent in the proximal to mid circumflex with up to 50% narrowing distal to the stent. Moderate pulmonary hypertension.  Mean pulmonary artery pressure 35 mmHg. Mean pulmonary capillary wedge pressure 32 mmHg.   RECOMMENDATIONS: Left ventricular systolic dysfunction is not on the basis of coronary disease.  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   Diagnostic Dominance: Right    _______________   Echocardiogram 11/02/2022 [facility]): LV: LV chamber is normal in size.  LVEF 40-45%.  Mild LVH. RV: RV moderately enlarged with moderately reduced systolic function.  Moderately elevated PASP of 54 mmHg. LA: Severely dilated. RA: Severely dilated. Aortic valve: Bioprosthetic aortic valve is in place with no significant AI.  Mean gradient 8 mmHg. Mitral valve: Mild mitral valve annular calcification.  Mild to moderate MR. Tricuspid valve: Moderate to severe TR.  Patient Profile     82 y.o. 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  He was found to have cavitary lesion in right upper lobe consistent with MRSA pneumonia and underwent thoracentesis prior to transfer to [facility].  This hospitalization has been complicated by persistent hypotension leading to endorgan damage and AKI with transaminitis.  Currently requiring BiPAP and midodrine for blood pressure support.  PCCM following.  Cardiology was initially consulted for evaluation of hypotension and help optimize him for TEE, however patient is not a candidate.  Assessment & Plan   Septic shock/MRSA bacteremia with necrotizing pneumonia/loculated pleural effusion status postthoracentesis/AKI/transaminitis/respiratory acidosis/anemia/respiratory failure PCCM on board. Requiring midodrine 10 mg 3 times daily with IV fluids.  Has worsening renal function and resolving lactic acid suggestive of endorgan damage.  Currently has altered mental status and is A&O x 1.  Currently on BiPAP.  Not a candidate for TEE given instability and tenuous respiratory status.  On abx per ID.  CAD status post DES to left circumflex in 2011 Previously has reported no anginal symptoms.  Home Toprol-XL has been held due to hypotension.  Has required 1 unit blood transfusion yesterday 11/30/2022  so chronic anticoagulation had not been given.  Can continue ASA 81 mg daily. Restart statin once transminitis resolves.  Chronic HFrEF Previous EF 20-30% in 2021. Recent echocardiogram 11/06/2022 at [facility] showed LVEF of 40 to 45% with moderately enlarged RV with moderately reduced systolic function and moderately elevated PASP of 54 mmHg.  BNP 1270.  Volume overloaded on exam. Echo during this admission shows LVEF 35 to 40% with global hypokinesis.  New grade 3 diastolic dysfunction.  Moderately reduced RV.  RVSP 56.  Severe dilatation of both atria.  IVC was dilated with  decreased compressibility.  Right atrial pressure 15. Holding GDMT given hypotension Received IV fluids over last few days with improvement in BP, LFTs, lactate has normalized.  However, renal function has worsened and now appears volume overloaded.  Would hold off on further IV fluids today  Permanent atrial fibrillation  Rates 80s to 90s despite holding home Toprol-XL due to hypotension.   On PO amio for rate control, there was mention of DC amio given elevated LFTs, though improving spontaneously, so will continue for now  Held off on heparin gtt given anemia requiring transfusion, can start if stable Hgb  History of Type B Thoracic Aortic Dissection and Acute Aortic Insufficiency S/p initial AVR and replacement of aortic arch to the left subclavian artery in 2010 with recurrent dissection ultimately requiring carotid subclavian bypass and thoracic stent graft in 2015. Stable AVR with mean gradient of 9.13mmHg.  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   Mild hypokalemia Defer lyte management to primary team  Mild to moderate MR, moderate to severe TR Continue to monitor.  Goals of care Patient has palliative care consult placed  For questions or updates, please contact [facility] Please consult [email or web address] for contact info  under        Signed, Abagail Kitchens, PA-C  12/01/2022, 10:22 AM     Patient seen and examined.  Agree with above documentation.  Patient seen and examined.  Agree with above documentation.  On exam, patient is on BiPAP, following commands, regular rhythm, normal rate, diminished breath sounds, + JVD.  Is on midodrine 10 mg 3 times daily, BP is improved today most recent 114/55.  Lactate normal, procalcitonin elevated. BP LFTs and lactate improved with IV fluids over the last 2 days, will hold off on additional IV fluids as now appears volume overloaded.  Suspect will need diuresis at some point. Echo shows EF 35 to 40%, similar to prior echo last month.  Little Ishikawa, MD

## 2022-12-01 NOTE — Progress Notes (Signed)
[facility]    Date of Admission:  11/28/2022   Total days of antibiotics 4 iv linezolid   ID: [name] [name] is a 82 y.o. 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   Medications:   [START ON 12/02/2022] amiodarone  200 mg Oral Daily   aspirin  81 mg Oral Daily   Chlorhexidine Gluconate Cloth  6 each Topical Q0600   heparin  5,000 Units Subcutaneous Q8H   loratadine  10 mg Oral Daily   midodrine  10 mg Oral TID WC   mupirocin ointment  1 Application Nasal BID   pantoprazole  40 mg Oral Daily   sodium chloride flush  10-40 mL Intracatheter Q12H   tamsulosin  0.4 mg Oral Daily    Objective: Vital signs in last 24 hours: Temp:  [98 F (36.7 C)-98.3 F (36.8 C)] 98 F (36.7 C) (06/14 1201) Pulse Rate:  [79-102] 90 (06/14 1201) Resp:  [16-22] 16 (06/14 1201) BP: (85-122)/(51-72) 121/68 (06/14 1348) SpO2:  [98 %-100 %] 100 % (06/14 1201) FiO2 (%):  [40 %] 40 %  (06/14 1348) Weight:  [86.6 kg] 86.6 kg (06/14 0401)  Physical Exam  Constitutional: sleepy wearing bipap. arousable. He appears well-developed and well-nourished. No distress.  HENT:  Mouth/Throat: Oropharynx is clear and moist. No oropharyngeal exudate.  Cardiovascular: Normal rate, regular rhythm and normal heart sounds. Exam reveals no gallop and no friction rub.  No murmur heard.  Pulmonary/Chest: Effort normal and breath sounds normal. No respiratory distress. He has no wheezes.  Abdominal: Soft. Bowel sounds are normal. He exhibits no distension. There is no tenderness.  Lymphadenopathy:  He has no cervical adenopathy.  Neurological: He is alert and oriented to person, place, and time.  Skin: Skin is warm and dry. No rash noted. No erythema.     Lab Results Recent Labs    11/30/22 0840 11/30/22 1641 12/01/22 0450  WBC 5.8  --  4.3  HGB 8.0* 7.9* 7.9*  HCT 27.7* 27.1* 27.0*  NA 139  --  135  K 3.7  --  3.4*  CL 95*  --  96*  CO2 29  --  24  BUN 38*  --  44*  CREATININE 2.98*  --  3.11*   Liver Panel Recent Labs    11/30/22 0840 12/01/22 0450  PROT 5.5* 5.2*  ALBUMIN 2.2* 2.2*  AST 156* 81*  ALT 113* 86*  ALKPHOS 94 85  BILITOT 1.1 1.0  Sedimentation Rate No results for input(s): "ESRSEDRATE" in the last 72 hours. C-Reactive Protein No results for input(s): "CRP" in the last 72 hours.  Microbiology: NGTD Studies/Results: ECHOCARDIOGRAM COMPLETE  Result Date: 12/01/2022    ECHOCARDIOGRAM REPORT   Patient Name:   [name] [name] Date of Exam: 12/01/2022 Medical Rec #:  [id number]         Height:       70.5 in Accession #:    [id number]        Weight:       190.9 lb Date of Birth:  1940-09-04         BSA:          2.057 m Patient Age:    82 years          BP:           102/68 mmHg Patient Gender: M                 HR:           85 bpm. Exam Location:  Inpatient Procedure: 2D Echo, Color Doppler and Cardiac Doppler Indications:    I50.21 Acute systolic  (congestive) heart failure  History:        Patient has prior history of Echocardiogram examinations, most                 recent 11/02/2022. CHF, CAD, Arrythmias:Atrial Fibrillation and                 Atrial Flutter; Risk Factors:Hypertension. 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                 Aortic Valve: unknown bioprosthetic valve is present in the                 aortic position. Procedure Date: 2010.  Sonographer:    Irving Burton Senior RDCS Referring Phys: [id number] Corrin Parker  Sonographer Comments: Scanned upright on BiPap IMPRESSIONS  1. Left ventricular ejection fraction, by estimation, is 35 to 40%. The left ventricle has moderately decreased function. The left ventricle demonstrates global hypokinesis. There is mild asymmetric left ventricular hypertrophy of the septal segment. Left ventricular diastolic parameters are consistent with Grade III diastolic dysfunction (restrictive).  2. Right ventricular systolic function is moderately reduced. The right ventricular size is moderately enlarged. There is moderately elevated pulmonary artery systolic pressure. The estimated right ventricular systolic pressure is 56.5 mmHg.  3. Left atrial size was severely dilated.  4. Right atrial size was severely dilated.  5. The mitral valve is abnormal. Moderate mitral valve regurgitation. Moderate mitral annular calcification.  6. The tricuspid valve is abnormal. Tricuspid valve regurgitation is moderate.  7. 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 The aortic valve has been repaired/replaced. Aortic valve regurgitation is not visualized. There is a unknown bioprosthetic valve present in the aortic position. Procedure Date: 2010. Echo findings are consistent with normal structure and function of the aortic valve prosthesis. Aortic valve mean gradient measures 9.5 mmHg.  8. The pulmonic valve was abnormal.  9. The inferior vena cava is dilated in size with <50% respiratory  variability, suggesting right atrial pressure of 15 mmHg. FINDINGS  Left Ventricle: Left ventricular ejection fraction, by estimation, is 35 to 40%. The left ventricle has moderately decreased function. The left ventricle demonstrates global hypokinesis. The left ventricular internal cavity  size was normal in size. There is mild asymmetric left ventricular hypertrophy of the septal segment. Left ventricular diastolic parameters are consistent with Grade III diastolic dysfunction (restrictive). Right Ventricle: The right ventricular size is moderately enlarged. No increase in right ventricular wall thickness. Right ventricular systolic function is moderately reduced. There is moderately elevated pulmonary artery systolic pressure. The tricuspid  regurgitant velocity is 3.22 m/s, and with an assumed right atrial pressure of 15 mmHg, the estimated right ventricular systolic pressure is 56.5 mmHg. Left Atrium: Left atrial size was severely dilated. Right Atrium: Right atrial size was severely dilated. Pericardium: There is no evidence of pericardial effusion. Mitral Valve: The mitral valve is abnormal. There is mild calcification of the mitral valve leaflet(s). Moderate mitral annular calcification. Moderate mitral valve regurgitation. MV peak gradient, 105.3 mmHg. Tricuspid Valve: The tricuspid valve is abnormal. Tricuspid valve regurgitation is moderate. Aortic Valve: Post AVR valve type unknown no PVL and normal gradients. The aortic valve has been repaired/replaced. Aortic valve regurgitation is not visualized. Aortic valve mean gradient measures 9.5 mmHg. Aortic valve peak gradient measures 16.1 mmHg.  Aortic valve area, by VTI measures 2.07 cm. There is a unknown bioprosthetic valve present in the aortic position. Procedure Date: 2010. Echo findings are consistent with normal structure and function of the aortic valve prosthesis. Pulmonic Valve: The pulmonic valve was abnormal. Pulmonic valve regurgitation is  mild to moderate. Aorta: The aortic root and ascending aorta are structurally normal, with no evidence of dilitation. Venous: The inferior vena cava is dilated in size with less than 50% respiratory variability, suggesting right atrial pressure of 15 mmHg. IAS/Shunts: No atrial level shunt detected by color flow Doppler.  LEFT VENTRICLE PLAX 2D LVIDd:         5.90 cm LVIDs:         4.90 cm LV PW:         1.00 cm LV IVS:        1.20 cm LVOT diam:     2.20 cm LV SV:         82 LV SV Index:   40 LVOT Area:     3.80 cm  LV Volumes (MOD) LV vol d, MOD A2C: 140.0 ml LV vol d, MOD A4C: 134.0 ml LV vol s, MOD A2C: 78.1 ml LV vol s, MOD A4C: 88.0 ml LV SV MOD A2C:     61.9 ml LV SV MOD A4C:     134.0 ml LV SV MOD BP:      54.2 ml RIGHT VENTRICLE RV S prime:     5.66 cm/s TAPSE (M-mode): 1.1 cm LEFT ATRIUM              Index        RIGHT ATRIUM           Index LA diam:        5.70 cm  2.77 cm/m   RA Area:     43.40 cm LA Vol (A2C):   135.0 ml 65.62 ml/m  RA Volume:   191.00 ml 92.84 ml/m LA Vol (A4C):   142.0 ml 69.02 ml/m LA Biplane Vol: 138.0 ml 67.08 ml/m  AORTIC VALVE AV Area (Vmax):    2.12 cm AV Area (Vmean):   2.27 cm AV Area (VTI):     2.07 cm AV Vmax:           200.50 cm/s AV Vmean:          147.000 cm/s AV VTI:  0.396 m AV Peak Grad:      16.1 mmHg AV Mean Grad:      9.5 mmHg LVOT Vmax:         112.00 cm/s LVOT Vmean:        87.700 cm/s LVOT VTI:          0.216 m LVOT/AV VTI ratio: 0.55  AORTA Ao Root diam: 3.20 cm Ao Asc diam:  3.70 cm MITRAL VALVE                  TRICUSPID VALVE MV Area (PHT): 4.31 cm       TR Peak grad:   41.5 mmHg MV Peak grad:  105.3 mmHg     TR Vmax:        322.00 cm/s MV Vmax:       5.13 m/s MR Peak grad:    112.8 mmHg   SHUNTS MR Mean grad:    72.0 mmHg    Systemic VTI:  0.22 m MR Vmax:         531.00 cm/s  Systemic Diam: 2.20 cm MR Vmean:        406.0 cm/s MR PISA:         2.26 cm MR PISA Eff ROA: 16 mm MR PISA Radius:  0.60 cm MV E velocity: 104.00 cm/s Charlton Haws  MD Electronically signed by Charlton Haws MD Signature Date/Time: 12/01/2022/10:04:19 AM    Final    US RENAL  Result Date: 11/30/2022 CLINICAL DATA:  Acute kidney injury EXAM: RENAL / URINARY TRACT ULTRASOUND COMPLETE COMPARISON:  11/28/2022 FINDINGS: Right Kidney: Renal measurements: 10.4 x 5.2 x 4.6 cm = volume: 131 mL. Echogenicity within normal limits. No mass or hydronephrosis visualized. Left Kidney: Renal measurements: 11.1 x 4.9 x 4.1 cm = volume: 116 mL. Echogenicity within normal limits. No mass or hydronephrosis visualized. Bladder: Appears normal for degree of bladder distention. Other: Right pleural effusion partially visualized. IMPRESSION: No acute findings.  No hydronephrosis. Right pleural effusion. Electronically Signed   By: Charlett Nose M.D.   On: 11/30/2022 22:19   VAS [place] ABI WITH/WO TBI  Result Date: 11/30/2022  LOWER EXTREMITY DOPPLER STUDY Patient Name:  [name] [name]  Date of Exam:   11/30/2022 Medical Rec #: [id number]          Accession #:    [id number] Date of Birth: 09-29-1940          Patient Gender: M Patient Age:   88 years Exam Location:  [facility] Procedure:      VAS [place] ABI WITH/WO TBI Referring Phys: MARCUS DUDA --------------------------------------------------------------------------------  Indications: Rest pain, ulceration, and peripheral artery disease. High Risk Factors: Hypertension, hyperlipidemia, coronary artery disease. Other Factors: Aortic valve replacement replacement of aortic root and ascending                aorta and replacement of aortic arch to the left subclavian                artery. 2010.  Comparison Study: No prior study on file Performing Technologist: Sherren Kerns RVS  Examination Guidelines: A complete evaluation includes at minimum, Doppler waveform signals and systolic blood pressure reading at the level of bilateral brachial, anterior tibial, and posterior tibial arteries, when vessel segments are accessible. Bilateral testing is  considered an integral part of a complete examination. Photoelectric Plethysmograph (PPG) waveforms and toe systolic pressure readings are included as required and additional duplex testing as needed. Limited examinations for  reoccurring indications may be performed as noted.  ABI Findings: +---------+------------------+-----+-------------------+---------+ Right    Rt Pressure (mmHg)IndexWaveform           Comment   +---------+------------------+-----+-------------------+---------+ Brachial                        multiphasic        PICC line +---------+------------------+-----+-------------------+---------+ PTA      55                0.54 dampened monophasic          +---------+------------------+-----+-------------------+---------+ DP       48                0.48 dampened monophasic          +---------+------------------+-----+-------------------+---------+ Great Toe0                 0.00 Absent                       +---------+------------------+-----+-------------------+---------+ +---------+------------------+-----+-------------------+-------+ Left     Lt Pressure (mmHg)IndexWaveform           Comment +---------+------------------+-----+-------------------+-------+ Brachial 101                    multiphasic                +---------+------------------+-----+-------------------+-------+ PTA      58                0.57 dampened monophasic        +---------+------------------+-----+-------------------+-------+ DP       0                 0.00 absent                     +---------+------------------+-----+-------------------+-------+ Great Toe0                 0.00 Absent                     +---------+------------------+-----+-------------------+-------+ +-------+-----------+-----------+------------+------------+ ABI/TBIToday's ABIToday's TBIPrevious ABIPrevious TBI +-------+-----------+-----------+------------+------------+ Right  0.54        absent                              +-------+-----------+-----------+------------+------------+ Left   0.57       absent                              +-------+-----------+-----------+------------+------------+  Summary: Right: Resting right ankle-brachial index indicates moderate right lower extremity arterial disease. The right toe-brachial index is abnormal. Left: Resting left ankle-brachial index indicates moderate left lower extremity arterial disease. The left toe-brachial index is abnormal. *See table(s) above for measurements and observations.  Suggest Peripheral Vascular Consult.    Preliminary      Assessment/Plan:  Disseminated MRSA infection with sequelae of cavitary pneumonia s/p thoracentesis = would repeat cxr over the weekend to see if reaccumulated to required repeat thoracentesis. In the meantime, continue on linezolid iv q12hr  Hx of AVR with aortic arch replacement and thoracic stent =  TTE did not suggest vegetation. Given his current clinical status, I don't think he could tolerated TEE. At present, would advocate for prolonged suppression with doxycycline 100mg  po bid after he is discharged  Aki = still elevated from recent hypotension. Doing a  trial of midodrine. His baseline is 1.2-1.5  Transaminitis = c/w congestive hepatopathy. Appears improving.  Dr Gershon Mussel vu available for questions over the weekend.  [facility] Pager: [phone]  12/01/2022, 3:16 PM

## 2022-12-02 ENCOUNTER — Inpatient Hospital Stay (HOSPITAL_COMMUNITY): Payer: Medicare Other

## 2022-12-02 DIAGNOSIS — R6521 Severe sepsis with septic shock: Secondary | ICD-10-CM | POA: Diagnosis not present

## 2022-12-02 DIAGNOSIS — J90 Pleural effusion, not elsewhere classified: Secondary | ICD-10-CM | POA: Diagnosis not present

## 2022-12-02 DIAGNOSIS — Z952 Presence of prosthetic heart valve: Secondary | ICD-10-CM | POA: Diagnosis not present

## 2022-12-02 DIAGNOSIS — R7881 Bacteremia: Secondary | ICD-10-CM | POA: Diagnosis not present

## 2022-12-02 DIAGNOSIS — B9562 Methicillin resistant Staphylococcus aureus infection as the cause of diseases classified elsewhere: Secondary | ICD-10-CM | POA: Diagnosis not present

## 2022-12-02 DIAGNOSIS — I5022 Chronic systolic (congestive) heart failure: Secondary | ICD-10-CM | POA: Diagnosis not present

## 2022-12-02 LAB — COMPREHENSIVE METABOLIC PANEL
ALT: 64 U/L — ABNORMAL HIGH (ref 0–44)
AST: 51 U/L — ABNORMAL HIGH (ref 15–41)
Albumin: 2.2 g/dL — ABNORMAL LOW (ref 3.5–5.0)
Alkaline Phosphatase: 83 U/L (ref 38–126)
Anion gap: 16 — ABNORMAL HIGH (ref 5–15)
BUN: 44 mg/dL — ABNORMAL HIGH (ref 8–23)
CO2: 24 mmol/L (ref 22–32)
Calcium: 7.9 mg/dL — ABNORMAL LOW (ref 8.9–10.3)
Chloride: 96 mmol/L — ABNORMAL LOW (ref 98–111)
Creatinine, Ser: 2.87 mg/dL — ABNORMAL HIGH (ref 0.61–1.24)
GFR, Estimated: 21 mL/min — ABNORMAL LOW (ref >60)
Glucose, Bld: 93 mg/dL (ref 70–99)
Potassium: 3.2 mmol/L — ABNORMAL LOW (ref 3.5–5.1)
Sodium: 136 mmol/L (ref 135–145)
Total Bilirubin: 1.2 mg/dL (ref 0.3–1.2)
Total Protein: 5.2 g/dL — ABNORMAL LOW (ref 6.5–8.1)

## 2022-12-02 LAB — CBC
HCT: 27.1 % — ABNORMAL LOW (ref 39.0–52.0)
Hemoglobin: 7.9 g/dL — ABNORMAL LOW (ref 13.0–17.0)
MCH: 29.3 pg (ref 26.0–34.0)
MCHC: 29.2 g/dL — ABNORMAL LOW (ref 30.0–36.0)
MCV: 100.4 fL — ABNORMAL HIGH (ref 80.0–100.0)
Platelets: 250 10*3/uL (ref 150–400)
RBC: 2.7 MIL/uL — ABNORMAL LOW (ref 4.22–5.81)
RDW: 18.2 % — ABNORMAL HIGH (ref 11.5–15.5)
WBC: 5.8 10*3/uL (ref 4.0–10.5)
nRBC: 0 % (ref 0.0–0.2)

## 2022-12-02 LAB — VAS US ABI WITH/WO TBI: Right ABI: 0.54

## 2022-12-02 MED ORDER — MELATONIN 5 MG PO TABS
5.0000 mg | ORAL_TABLET | Freq: Every evening | ORAL | Status: DC | PRN
  Administered 2022-12-02 – 2022-12-05 (×4): 5 mg via ORAL
  Filled 2022-12-02 (×5): qty 1

## 2022-12-02 MED ORDER — FENTANYL CITRATE PF 50 MCG/ML IJ SOSY
12.5000 ug | PREFILLED_SYRINGE | INTRAMUSCULAR | Status: DC | PRN
  Administered 2022-12-02 – 2022-12-07 (×15): 12.5 ug via INTRAVENOUS
  Filled 2022-12-02 (×16): qty 1

## 2022-12-02 MED ORDER — POTASSIUM CHLORIDE CRYS ER 20 MEQ PO TBCR
60.0000 meq | EXTENDED_RELEASE_TABLET | Freq: Once | ORAL | Status: AC
  Administered 2022-12-02: 60 meq via ORAL
  Filled 2022-12-02: qty 3

## 2022-12-02 NOTE — Progress Notes (Addendum)
[facility]    Date of Admission:  11/28/2022   Total days of antibiotics 4 iv linezolid   ID: [name] [name] is a 82 y.o. 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)   Acute metabolic encephalopathy   AKI (acute kidney injury) (HCC)    Subjective: Continues to remain somnolent Afebrile No leukocytosis Aki on ckd improving    Medications:   amiodarone  200 mg Oral Daily   aspirin  81 mg Oral Daily   Chlorhexidine Gluconate Cloth  6 each Topical Q0600   heparin  5,000 Units Subcutaneous Q8H   loratadine  10 mg Oral Daily   midodrine  10 mg Oral TID WC   mupirocin ointment  1 Application Nasal BID   pantoprazole  40 mg Oral Daily   sodium chloride flush  10-40 mL Intracatheter Q12H   tamsulosin  0.4 mg Oral Daily    Objective: Vital signs in last 24 hours: Temp:  [98.2 F (36.8 C)-98.3 F (36.8 C)] 98.3 F (36.8 C) (06/15 0700) Pulse Rate:  [85-111] 106 (06/15 1217) Resp:  [16-30] 30 (06/15 1217) BP: (99-124)/(58-86) 106/63 (06/15 0700) SpO2:  [87 %-100 %] 98 % (06/15 1217) FiO2  (%):  [40 %] 40 % (06/15 1217) Weight:  [85.9 kg] 85.9 kg (06/15 0523)  Physical Exam  Constitutional: sleepy wearing bipap. arousable. He appears well-developed and well-nourished. No distress.  HENT:  Mouth/Throat: Oropharynx is clear and moist. No oropharyngeal exudate.  Cardiovascular: Normal rate, regular rhythm and normal heart sounds. Exam reveals no gallop and no friction rub.  No murmur heard.  Pulmonary/Chest: Effort normal and breath sounds normal. No respiratory distress. He has no wheezes.  Abdominal: Soft. Bowel sounds are normal. He exhibits no distension. There is no tenderness.  Lymphadenopathy:  He has no cervical adenopathy.  Neurological: He is alert and oriented to person, place, and time.  Skin: Skin is warm and dry. No rash noted. No erythema.     Lab Results Recent Labs    12/01/22 0450 12/02/22 0500  WBC 4.3 5.8  HGB 7.9* 7.9*  HCT 27.0* 27.1*  NA 135 136  K 3.4* 3.2*  CL 96* 96*  CO2 24 24  BUN 44* 44*  CREATININE 3.11* 2.87*   Liver Panel Recent Labs    12/01/22 0450 12/02/22 0500  PROT 5.2* 5.2*  ALBUMIN 2.2* 2.2*  AST 81* 51*  ALT 86* 64*  ALKPHOS 85 83  BILITOT 1.0 1.2   Sedimentation Rate No results for input(s): "ESRSEDRATE" in the last 72 hours. C-Reactive Protein No results for input(s): "CRP" in the  last 72 hours.  Microbiology: NGTD Studies/Results: DG CHEST PORT 1 VIEW  Result Date: 12/02/2022 CLINICAL DATA:  142230 Pleural effusion 142230 EXAM: PORTABLE CHEST 1 VIEW COMPARISON:  November 28, 2022 FINDINGS: The cardiomediastinal silhouette is unchanged in contour.Status post median sternotomy head with stent graft placement throughout the aorta. RIGHT upper extremity PICC tip terminates over the superior cavoatrial junction. Large LEFT pleural effusion, similar in comparison to most recent prior. Small RIGHT pleural effusion. No pneumothorax. Diffuse bilateral airspace opacities, with mildly increased confluence in the RIGHT  lateral lower lung base in comparison to prior. Similar confluent opacity of the RIGHT lateral apex. IMPRESSION: 1. Similar appearance of large LEFT and small RIGHT pleural effusions. 2. Diffuse bilateral airspace opacities, with mildly increased confluence in the RIGHT lateral lower lung base in comparison to prior. Electronically Signed   By: Meda Klinefelter M.D.   On: 12/02/2022 13:16   ECHOCARDIOGRAM COMPLETE  Result Date: 12/01/2022    ECHOCARDIOGRAM REPORT   Patient Name:   [name] [name] Date of Exam: 12/01/2022 Medical Rec #:  [id number]         Height:       70.5 in Accession #:    [id number]        Weight:       190.9 lb Date of Birth:  Sep 20, 1940         BSA:          2.057 m Patient Age:    82 years          BP:           102/68 mmHg Patient Gender: M                 HR:           85 bpm. Exam Location:  Inpatient Procedure: 2D Echo, Color Doppler and Cardiac Doppler Indications:    I50.21 Acute systolic (congestive) heart failure  History:        Patient has prior history of Echocardiogram examinations, most                 recent 11/02/2022. CHF, CAD, Arrythmias:Atrial Fibrillation and                 Atrial Flutter; Risk Factors:Hypertension. 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                 Aortic Valve: unknown bioprosthetic valve is present in the                 aortic position. Procedure Date: 2010.  Sonographer:    Irving Burton Senior RDCS Referring Phys: [id number] Corrin Parker  Sonographer Comments: Scanned upright on BiPap IMPRESSIONS  1. Left ventricular ejection fraction, by estimation, is 35 to 40%. The left ventricle has moderately decreased function. The left ventricle demonstrates global hypokinesis. There is mild asymmetric left ventricular hypertrophy of the septal segment. Left ventricular diastolic parameters are consistent with Grade III diastolic dysfunction (restrictive).  2. Right ventricular systolic function is moderately reduced. The right  ventricular size is moderately enlarged. There is moderately elevated pulmonary artery systolic pressure. The estimated right ventricular systolic pressure is 56.5 mmHg.  3. Left atrial size was severely dilated.  4. Right atrial size was severely dilated.  5. The mitral valve is abnormal. Moderate mitral valve regurgitation. Moderate mitral annular calcification.  6. The tricuspid  valve is abnormal. Tricuspid valve regurgitation is moderate.  7. 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 The aortic valve has been repaired/replaced. Aortic valve regurgitation is not visualized. There is a unknown bioprosthetic valve present in the aortic position. Procedure Date: 2010. Echo findings are consistent with normal structure and function of the aortic valve prosthesis. Aortic valve mean gradient measures 9.5 mmHg.  8. The pulmonic valve was abnormal.  9. The inferior vena cava is dilated in size with <50% respiratory variability, suggesting right atrial pressure of 15 mmHg. FINDINGS  Left Ventricle: Left ventricular ejection fraction, by estimation, is 35 to 40%. The left ventricle has moderately decreased function. The left ventricle demonstrates global hypokinesis. The left ventricular internal cavity size was normal in size. There is mild asymmetric left ventricular hypertrophy of the septal segment. Left ventricular diastolic parameters are consistent with Grade III diastolic dysfunction (restrictive). Right Ventricle: The right ventricular size is moderately enlarged. No increase in right ventricular wall thickness. Right ventricular systolic function is moderately reduced. There is moderately elevated pulmonary artery systolic pressure. The tricuspid  regurgitant velocity is 3.22 m/s, and with an assumed right atrial pressure of 15 mmHg, the estimated right ventricular systolic pressure is 56.5 mmHg. Left Atrium: Left atrial size was severely dilated. Right Atrium: Right atrial size was severely  dilated. Pericardium: There is no evidence of pericardial effusion. Mitral Valve: The mitral valve is abnormal. There is mild calcification of the mitral valve leaflet(s). Moderate mitral annular calcification. Moderate mitral valve regurgitation. MV peak gradient, 105.3 mmHg. Tricuspid Valve: The tricuspid valve is abnormal. Tricuspid valve regurgitation is moderate. Aortic Valve: Post AVR valve type unknown no PVL and normal gradients. The aortic valve has been repaired/replaced. Aortic valve regurgitation is not visualized. Aortic valve mean gradient measures 9.5 mmHg. Aortic valve peak gradient measures 16.1 mmHg.  Aortic valve area, by VTI measures 2.07 cm. There is a unknown bioprosthetic valve present in the aortic position. Procedure Date: 2010. Echo findings are consistent with normal structure and function of the aortic valve prosthesis. Pulmonic Valve: The pulmonic valve was abnormal. Pulmonic valve regurgitation is mild to moderate. Aorta: The aortic root and ascending aorta are structurally normal, with no evidence of dilitation. Venous: The inferior vena cava is dilated in size with less than 50% respiratory variability, suggesting right atrial pressure of 15 mmHg. IAS/Shunts: No atrial level shunt detected by color flow Doppler.  LEFT VENTRICLE PLAX 2D LVIDd:         5.90 cm LVIDs:         4.90 cm LV PW:         1.00 cm LV IVS:        1.20 cm LVOT diam:     2.20 cm LV SV:         82 LV SV Index:   40 LVOT Area:     3.80 cm  LV Volumes (MOD) LV vol d, MOD A2C: 140.0 ml LV vol d, MOD A4C: 134.0 ml LV vol s, MOD A2C: 78.1 ml LV vol s, MOD A4C: 88.0 ml LV SV MOD A2C:     61.9 ml LV SV MOD A4C:     134.0 ml LV SV MOD BP:      54.2 ml RIGHT VENTRICLE RV S prime:     5.66 cm/s TAPSE (M-mode): 1.1 cm LEFT ATRIUM              Index        RIGHT  ATRIUM           Index LA diam:        5.70 cm  2.77 cm/m   RA Area:     43.40 cm LA Vol (A2C):   135.0 ml 65.62 ml/m  RA Volume:   191.00 ml 92.84 ml/m LA Vol  (A4C):   142.0 ml 69.02 ml/m LA Biplane Vol: 138.0 ml 67.08 ml/m  AORTIC VALVE AV Area (Vmax):    2.12 cm AV Area (Vmean):   2.27 cm AV Area (VTI):     2.07 cm AV Vmax:           200.50 cm/s AV Vmean:          147.000 cm/s AV VTI:            0.396 m AV Peak Grad:      16.1 mmHg AV Mean Grad:      9.5 mmHg LVOT Vmax:         112.00 cm/s LVOT Vmean:        87.700 cm/s LVOT VTI:          0.216 m LVOT/AV VTI ratio: 0.55  AORTA Ao Root diam: 3.20 cm Ao Asc diam:  3.70 cm MITRAL VALVE                  TRICUSPID VALVE MV Area (PHT): 4.31 cm       TR Peak grad:   41.5 mmHg MV Peak grad:  105.3 mmHg     TR Vmax:        322.00 cm/s MV Vmax:       5.13 m/s MR Peak grad:    112.8 mmHg   SHUNTS MR Mean grad:    72.0 mmHg    Systemic VTI:  0.22 m MR Vmax:         531.00 cm/s  Systemic Diam: 2.20 cm MR Vmean:        406.0 cm/s MR PISA:         2.26 cm MR PISA Eff ROA: 16 mm MR PISA Radius:  0.60 cm MV E velocity: 104.00 cm/s Charlton Haws MD Electronically signed by Charlton Haws MD Signature Date/Time: 12/01/2022/10:04:19 AM    Final    US RENAL  Result Date: 11/30/2022 CLINICAL DATA:  Acute kidney injury EXAM: RENAL / URINARY TRACT ULTRASOUND COMPLETE COMPARISON:  11/28/2022 FINDINGS: Right Kidney: Renal measurements: 10.4 x 5.2 x 4.6 cm = volume: 131 mL. Echogenicity within normal limits. No mass or hydronephrosis visualized. Left Kidney: Renal measurements: 11.1 x 4.9 x 4.1 cm = volume: 116 mL. Echogenicity within normal limits. No mass or hydronephrosis visualized. Bladder: Appears normal for degree of bladder distention. Other: Right pleural effusion partially visualized. IMPRESSION: No acute findings.  No hydronephrosis. Right pleural effusion. Electronically Signed   By: Charlett Nose M.D.   On: 11/30/2022 22:19   VAS [place] ABI WITH/WO TBI  Result Date: 11/30/2022  LOWER EXTREMITY DOPPLER STUDY Patient Name:  [name] [name]  Date of Exam:   11/30/2022 Medical Rec #: [id number]          Accession #:    [id number]  Date of Birth: 04/16/1941          Patient Gender: M Patient Age:   89 years Exam Location:  [facility] Procedure:      VAS [place] ABI WITH/WO TBI Referring Phys: MARCUS DUDA --------------------------------------------------------------------------------  Indications: Rest pain, ulceration, and peripheral artery disease. High Risk Factors: Hypertension,  hyperlipidemia, coronary artery disease. Other Factors: Aortic valve replacement replacement of aortic root and ascending                aorta and replacement of aortic arch to the left subclavian                artery. 2010.  Comparison Study: No prior study on file Performing Technologist: Sherren Kerns RVS  Examination Guidelines: A complete evaluation includes at minimum, Doppler waveform signals and systolic blood pressure reading at the level of bilateral brachial, anterior tibial, and posterior tibial arteries, when vessel segments are accessible. Bilateral testing is considered an integral part of a complete examination. Photoelectric Plethysmograph (PPG) waveforms and toe systolic pressure readings are included as required and additional duplex testing as needed. Limited examinations for reoccurring indications may be performed as noted.  ABI Findings: +---------+------------------+-----+-------------------+---------+ Right    Rt Pressure (mmHg)IndexWaveform           Comment   +---------+------------------+-----+-------------------+---------+ Brachial                        multiphasic        PICC line +---------+------------------+-----+-------------------+---------+ PTA      55                0.54 dampened monophasic          +---------+------------------+-----+-------------------+---------+ DP       48                0.48 dampened monophasic          +---------+------------------+-----+-------------------+---------+ Great Toe0                 0.00 Absent                        +---------+------------------+-----+-------------------+---------+ +---------+------------------+-----+-------------------+-------+ Left     Lt Pressure (mmHg)IndexWaveform           Comment +---------+------------------+-----+-------------------+-------+ Brachial 101                    multiphasic                +---------+------------------+-----+-------------------+-------+ PTA      58                0.57 dampened monophasic        +---------+------------------+-----+-------------------+-------+ DP       0                 0.00 absent                     +---------+------------------+-----+-------------------+-------+ Great Toe0                 0.00 Absent                     +---------+------------------+-----+-------------------+-------+ +-------+-----------+-----------+------------+------------+ ABI/TBIToday's ABIToday's TBIPrevious ABIPrevious TBI +-------+-----------+-----------+------------+------------+ Right  0.54       absent                              +-------+-----------+-----------+------------+------------+ Left   0.57       absent                              +-------+-----------+-----------+------------+------------+  Summary: Right: Resting right ankle-brachial index indicates moderate right lower extremity arterial  disease. The right toe-brachial index is abnormal. Left: Resting left ankle-brachial index indicates moderate left lower extremity arterial disease. The left toe-brachial index is abnormal. *See table(s) above for measurements and observations.  Suggest Peripheral Vascular Consult.    Preliminary      Assessment/Plan: 82 yo male with cad s/p des to lcx 2011, combined chf ef 25%, afib/flutter on eliquis, type b aortic dissection with AI s/p avr and replacement aortic arch to the left subclavian artery 2010 but recurrent dissection s/p carotid/subclavian bypass and thoracic stent 2015 here with bad mrsa infection  He was originally  admitted at [place]  mid 10/2022 for sepsis/mrsa infection in setting of left second toe wound/infection   This admission at [facility]--> Disseminated MRSA infection with sequelae of cavitary pneumonia s/p thoracentesis = would repeat cxr over the weekend to see if reaccumulated to required repeat thoracentesis. In the meantime, continue on linezolid iv q12hr  [facility] data: 5/16 initial bcx mrsa; 5/23 continued bcx positivity 6/03 left second toe wound cx mrsa -- probable source    Hx of AVR with aortic arch replacement and thoracic stent =  TTE did not suggest vegetation. But high risk for presence of IE. Given his current clinical status, I don't think he could tolerated TEE. So I would presume to treat as such with a good 6 weeks abx therapy. He had 2 weeks daptomycin and I would plan on at least 4 more weeks linezolid. After that if pneumonia still present reasonable to continue with doxycycline  Aki = still elevated from recent hypotension. Doing a trial of midodrine. His baseline is 1.2-1.5  Transaminitis = c/w congestive hepatopathy. Appears improving.  --- Overall high mortality case. The source for the original infection in the toe still present and patient too sick for amputation. Per dr Lajoyce Corners ABI showed severe pvd and even if qualify for amputation would wouldn't heal and might need vascular surgery evaluation if aggressive care planned   He also has large effusion concerning for empyema  Plan: -continue linezolid for now -will need 4 weeks of linezolid then chronic doxy suppression in setting of stent/severe mrsa infection -appreciate cards managing chf -agree palliative care would be very helpful given rather complex case with high mortality rate/complication (current dnr/dni or icu care) -if goals of care congruent and family agreeable, proper management would also include at least a diagnostic thoracentesis of the left large pleural effusion to r/o parapneumonic effusion/empyema  and management as appropriate -discussed with primary team     --------------- Addendum Family reported that the left pleural fluid was aspirated at [place] 6/11 prior to coming to [place]. Will need to follow up on that  Obviously the repeat imaging here showed either reaccumulation or remaining large left effusion        Raymondo Band, MD [facility] [phone]  pager   [phone] cell 12/02/2022, 1:51 PM   12/02/2022, 1:43 PM

## 2022-12-02 NOTE — Progress Notes (Signed)
   Palliative Medicine Inpatient Follow Up Note HPI:  82 year old male prior history of chronic systolic heart failure EF of 40 to 45%, history of bioprosthetic aortic valve replacement, history of aortic aneurysm, peripheral vascular disease, chronic A-fib on Eliquis, chronic wounds on his bilateral lower extremities, history of anemia. Complicated medical course since May 13th in the setting of his MRSA pneumonia. Transferred from [facility] for additional ID support. The PMT was consulted to further assist with goals of care conversations in the setting of acute illness.   Today's Discussion 12/02/2022  *Please note that this is a verbal dictation therefore any spelling or grammatical errors are due to the "[employer] One" system interpretation.  Chart reviewed inclusive of vital signs, progress notes, laboratory results, and diagnostic images.   I met with Alphonsa and his spouse, [name] at bedside. Craigory is resting comfortably at the time of assessment. He is able to share with me that he is not in pain, nauseated, or short of breath.  Patients spouse shares that Bodey has made some improvements. He is now producing urine and his CXR appears improved. She remains hopeful that he will continue to improve.  Patients daughter, [name] enters the room during my time at bedside. We discussed how she and her mother are at peace with whatever may come in the future.  Plan to continue present care.   Questions and concerns addressed/Palliative Support Provided.   Objective Assessment: Vital Signs Vitals:   12/02/22 0333 12/02/22 0700  BP: 124/64 106/63  Pulse: (!) 108 97  Resp: (!) 22 20  Temp:  98.3 F (36.8 C)  SpO2: 96% 100%   No intake or output data in the 24 hours ending 12/02/22 1128 Last Weight  Most recent update: 12/02/2022  5:23 AM    Weight  85.9 kg (189 lb 6 oz)            Gen: Elderly Caucasian male on BiPAP HEENT: Dry mucous membranes CV: Regular rate and  irregular rhythm PULM: On 5LPM [place], breathing appears even and nonlabored ABD:  nondistended  EXT: No edema  Neuro:  Awake and responsive to questions  SUMMARY OF RECOMMENDATIONS   DNAR/DNI --> no escalation to ICU level of care though optimize maximal support on 6E   Allow time for outcomes and treat the treatable   Ongoing palliative medicine team support  Billing based on MDM: Moderate  ______________________________________________________________________________________ Lamarr Lulas [place] Palliative Medicine Team Team Cell Phone: [phone] Please utilize secure chat with additional questions, if there is no response within 30 minutes please call the above phone number  Palliative Medicine Team providers are available by phone from 7am to 7pm daily and can be reached through the team cell phone.  Should this patient require assistance outside of these hours, please call the patient's attending physician.

## 2022-12-02 NOTE — Progress Notes (Signed)
Rounding Note    Patient Name: [name] [name] Date of Encounter: 12/02/2022  [facility] Cardiologist: Lance Muss, MD   Subjective   Agitated in bed, trying to get up. Working to breath  Inpatient Medications    Scheduled Meds:  amiodarone  200 mg Oral Daily   aspirin  81 mg Oral Daily   Chlorhexidine Gluconate Cloth  6 each Topical Q0600   heparin  5,000 Units Subcutaneous Q8H   loratadine  10 mg Oral Daily   midodrine  10 mg Oral TID WC   mupirocin ointment  1 Application Nasal BID   pantoprazole  40 mg Oral Daily   sodium chloride flush  10-40 mL Intracatheter Q12H   tamsulosin  0.4 mg Oral Daily   Continuous Infusions:  linezolid (ZYVOX) IV 600 mg (12/02/22 1124)   PRN Meds: acetaminophen **OR** acetaminophen, albuterol, melatonin, ondansetron **OR** ondansetron (ZOFRAN) IV, sodium chloride flush, traMADol   Vital Signs    Vitals:   12/02/22 0523 12/02/22 0700 12/02/22 1216 12/02/22 1217  BP:  106/63    Pulse:  97 (!) 111 (!) 106  Resp:  20 (!) 26 (!) 30  Temp:  98.3 F (36.8 C)    TempSrc:  Oral    SpO2:  100% (!) 87% 98%  Weight: 85.9 kg      No intake or output data in the 24 hours ending 12/02/22 1218     12/02/2022    5:23 AM 12/01/2022    4:01 AM 11/30/2022    2:58 AM  Last 3 Weights  Weight (lbs) 189 lb 6 oz 190 lb 14.7 oz 187 lb 6.3 oz  Weight (kg) 85.9 kg 86.6 kg 85 kg      Telemetry    Atrial fibrillation rate controlled.- Personally Reviewed  ECG     No new- Personally Reviewed  Physical Exam   GEN: agitated, working to breath, trying to get out of bed Neck: challenging to examine JVD with agitation, not following commands Cardiac: Irregularly irregular.  respiratory:  Increased work of breathing.  Diminished breath sounds at the bases. GI: Soft, nontender, non-distended  MS: No edema; No deformity. Neuro: unable to assess.  Psych: not able to assess  Labs    High Sensitivity Troponin:  No results for  input(s): "TROPONINIHS" in the last 720 hours.   Chemistry Recent Labs  Lab 11/30/22 0840 12/01/22 0450 12/02/22 0500  NA 139 135 136  K 3.7 3.4* 3.2*  CL 95* 96* 96*  CO2 29 24 24   GLUCOSE 102* 93 93  BUN 38* 44* 44*  CREATININE 2.98* 3.11* 2.87*  CALCIUM 7.7* 7.6* 7.9*  MG  --  2.3  --   PROT 5.5* 5.2* 5.2*  ALBUMIN 2.2* 2.2* 2.2*  AST 156* 81* 51*  ALT 113* 86* 64*  ALKPHOS 94 85 83  BILITOT 1.1 1.0 1.2  GFRNONAA 20* 19* 21*  ANIONGAP 15 15 16*    Lipids No results for input(s): "CHOL", "TRIG", "HDL", "LABVLDL", "LDLCALC", "CHOLHDL" in the last 168 hours.  Hematology Recent Labs  Lab 11/30/22 0840 11/30/22 1641 12/01/22 0450 12/02/22 0500  WBC 5.8  --  4.3 5.8  RBC 2.77*  --  2.65* 2.70*  HGB 8.0* 7.9* 7.9* 7.9*  HCT 27.7* 27.1* 27.0* 27.1*  MCV 100.0  --  101.9* 100.4*  MCH 28.9  --  29.8 29.3  MCHC 28.9*  --  29.3* 29.2*  RDW 18.6*  --  18.5* 18.2*  PLT 255  --  241 250   Thyroid No results for input(s): "TSH", "FREET4" in the last 168 hours.  BNP Recent Labs  Lab 11/28/22 2146  BNP 1,270.5*    DDimer No results for input(s): "DDIMER" in the last 168 hours.   Radiology    ECHOCARDIOGRAM COMPLETE  Result Date: 12/01/2022    ECHOCARDIOGRAM REPORT   Patient Name:   [name] [name] Date of Exam: 12/01/2022 Medical Rec #:  [id number]         Height:       70.5 in Accession #:    [id number]        Weight:       190.9 lb Date of Birth:  1940-09-16         BSA:          2.057 m Patient Age:    82 years          BP:           102/68 mmHg Patient Gender: M                 HR:           85 bpm. Exam Location:  Inpatient Procedure: 2D Echo, Color Doppler and Cardiac Doppler Indications:    I50.21 Acute systolic (congestive) heart failure  History:        Patient has prior history of Echocardiogram examinations, most                 recent 11/02/2022. CHF, CAD, Arrythmias:Atrial Fibrillation and                 Atrial Flutter; Risk Factors:Hypertension. Bioprosthetic  AVR in                 2010 with arch replacement.                 Aortic Valve: unknown bioprosthetic valve is present in the                 aortic position. Procedure Date: 2010.  Sonographer:    Irving Burton Senior RDCS Referring Phys: [id number] Corrin Parker  Sonographer Comments: Scanned upright on BiPap IMPRESSIONS  1. Left ventricular ejection fraction, by estimation, is 35 to 40%. The left ventricle has moderately decreased function. The left ventricle demonstrates global hypokinesis. There is mild asymmetric left ventricular hypertrophy of the septal segment. Left ventricular diastolic parameters are consistent with Grade III diastolic dysfunction (restrictive).  2. Right ventricular systolic function is moderately reduced. The right ventricular size is moderately enlarged. There is moderately elevated pulmonary artery systolic pressure. The estimated right ventricular systolic pressure is 56.5 mmHg.  3. Left atrial size was severely dilated.  4. Right atrial size was severely dilated.  5. The mitral valve is abnormal. Moderate mitral valve regurgitation. Moderate mitral annular calcification.  6. The tricuspid valve is abnormal. Tricuspid valve regurgitation is moderate.  7. 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 The aortic valve has been repaired/replaced. Aortic valve regurgitation is not visualized. There is a unknown bioprosthetic valve present in the aortic position. Procedure Date: 2010. Echo findings are consistent with normal structure and function of the aortic valve prosthesis. Aortic valve mean gradient measures 9.5 mmHg.  8. The pulmonic valve was abnormal.  9. The inferior vena cava is dilated in size with <50% respiratory variability, suggesting right atrial pressure of 15 mmHg. FINDINGS  Left Ventricle: Left ventricular ejection fraction, by estimation, is 35 to 40%.  The left ventricle has moderately decreased function. The left ventricle demonstrates global hypokinesis. The  left ventricular internal cavity size was normal in size. There is mild asymmetric left ventricular hypertrophy of the septal segment. Left ventricular diastolic parameters are consistent with Grade III diastolic dysfunction (restrictive). Right Ventricle: The right ventricular size is moderately enlarged. No increase in right ventricular wall thickness. Right ventricular systolic function is moderately reduced. There is moderately elevated pulmonary artery systolic pressure. The tricuspid  regurgitant velocity is 3.22 m/s, and with an assumed right atrial pressure of 15 mmHg, the estimated right ventricular systolic pressure is 56.5 mmHg. Left Atrium: Left atrial size was severely dilated. Right Atrium: Right atrial size was severely dilated. Pericardium: There is no evidence of pericardial effusion. Mitral Valve: The mitral valve is abnormal. There is mild calcification of the mitral valve leaflet(s). Moderate mitral annular calcification. Moderate mitral valve regurgitation. MV peak gradient, 105.3 mmHg. Tricuspid Valve: The tricuspid valve is abnormal. Tricuspid valve regurgitation is moderate. Aortic Valve: Post AVR valve type unknown no PVL and normal gradients. The aortic valve has been repaired/replaced. Aortic valve regurgitation is not visualized. Aortic valve mean gradient measures 9.5 mmHg. Aortic valve peak gradient measures 16.1 mmHg.  Aortic valve area, by VTI measures 2.07 cm. There is a unknown bioprosthetic valve present in the aortic position. Procedure Date: 2010. Echo findings are consistent with normal structure and function of the aortic valve prosthesis. Pulmonic Valve: The pulmonic valve was abnormal. Pulmonic valve regurgitation is mild to moderate. Aorta: The aortic root and ascending aorta are structurally normal, with no evidence of dilitation. Venous: The inferior vena cava is dilated in size with less than 50% respiratory variability, suggesting right atrial pressure of 15 mmHg.  IAS/Shunts: No atrial level shunt detected by color flow Doppler.  LEFT VENTRICLE PLAX 2D LVIDd:         5.90 cm LVIDs:         4.90 cm LV PW:         1.00 cm LV IVS:        1.20 cm LVOT diam:     2.20 cm LV SV:         82 LV SV Index:   40 LVOT Area:     3.80 cm  LV Volumes (MOD) LV vol d, MOD A2C: 140.0 ml LV vol d, MOD A4C: 134.0 ml LV vol s, MOD A2C: 78.1 ml LV vol s, MOD A4C: 88.0 ml LV SV MOD A2C:     61.9 ml LV SV MOD A4C:     134.0 ml LV SV MOD BP:      54.2 ml RIGHT VENTRICLE RV S prime:     5.66 cm/s TAPSE (M-mode): 1.1 cm LEFT ATRIUM              Index        RIGHT ATRIUM           Index LA diam:        5.70 cm  2.77 cm/m   RA Area:     43.40 cm LA Vol (A2C):   135.0 ml 65.62 ml/m  RA Volume:   191.00 ml 92.84 ml/m LA Vol (A4C):   142.0 ml 69.02 ml/m LA Biplane Vol: 138.0 ml 67.08 ml/m  AORTIC VALVE AV Area (Vmax):    2.12 cm AV Area (Vmean):   2.27 cm AV Area (VTI):     2.07 cm AV Vmax:  200.50 cm/s AV Vmean:          147.000 cm/s AV VTI:            0.396 m AV Peak Grad:      16.1 mmHg AV Mean Grad:      9.5 mmHg LVOT Vmax:         112.00 cm/s LVOT Vmean:        87.700 cm/s LVOT VTI:          0.216 m LVOT/AV VTI ratio: 0.55  AORTA Ao Root diam: 3.20 cm Ao Asc diam:  3.70 cm MITRAL VALVE                  TRICUSPID VALVE MV Area (PHT): 4.31 cm       TR Peak grad:   41.5 mmHg MV Peak grad:  105.3 mmHg     TR Vmax:        322.00 cm/s MV Vmax:       5.13 m/s MR Peak grad:    112.8 mmHg   SHUNTS MR Mean grad:    72.0 mmHg    Systemic VTI:  0.22 m MR Vmax:         531.00 cm/s  Systemic Diam: 2.20 cm MR Vmean:        406.0 cm/s MR PISA:         2.26 cm MR PISA Eff ROA: 16 mm MR PISA Radius:  0.60 cm MV E velocity: 104.00 cm/s Charlton Haws MD Electronically signed by Charlton Haws MD Signature Date/Time: 12/01/2022/10:04:19 AM    Final    US RENAL  Result Date: 11/30/2022 CLINICAL DATA:  Acute kidney injury EXAM: RENAL / URINARY TRACT ULTRASOUND COMPLETE COMPARISON:  11/28/2022 FINDINGS:  Right Kidney: Renal measurements: 10.4 x 5.2 x 4.6 cm = volume: 131 mL. Echogenicity within normal limits. No mass or hydronephrosis visualized. Left Kidney: Renal measurements: 11.1 x 4.9 x 4.1 cm = volume: 116 mL. Echogenicity within normal limits. No mass or hydronephrosis visualized. Bladder: Appears normal for degree of bladder distention. Other: Right pleural effusion partially visualized. IMPRESSION: No acute findings.  No hydronephrosis. Right pleural effusion. Electronically Signed   By: Charlett Nose M.D.   On: 11/30/2022 22:19   VAS [place] ABI WITH/WO TBI  Result Date: 11/30/2022  LOWER EXTREMITY DOPPLER STUDY Patient Name:  [name] [name]  Date of Exam:   11/30/2022 Medical Rec #: [id number]          Accession #:    [id number] Date of Birth: 1940-11-27          Patient Gender: M Patient Age:   73 years Exam Location:  [facility] Procedure:      VAS [place] ABI WITH/WO TBI Referring Phys: MARCUS DUDA --------------------------------------------------------------------------------  Indications: Rest pain, ulceration, and peripheral artery disease. High Risk Factors: Hypertension, hyperlipidemia, coronary artery disease. Other Factors: Aortic valve replacement replacement of aortic root and ascending                aorta and replacement of aortic arch to the left subclavian                artery. 2010.  Comparison Study: No prior study on file Performing Technologist: Sherren Kerns RVS  Examination Guidelines: A complete evaluation includes at minimum, Doppler waveform signals and systolic blood pressure reading at the level of bilateral brachial, anterior tibial, and posterior tibial arteries, when vessel segments are accessible. Bilateral testing is considered an integral  part of a complete examination. Photoelectric Plethysmograph (PPG) waveforms and toe systolic pressure readings are included as required and additional duplex testing as needed. Limited examinations for reoccurring indications may  be performed as noted.  ABI Findings: +---------+------------------+-----+-------------------+---------+ Right    Rt Pressure (mmHg)IndexWaveform           Comment   +---------+------------------+-----+-------------------+---------+ Brachial                        multiphasic        PICC line +---------+------------------+-----+-------------------+---------+ PTA      55                0.54 dampened monophasic          +---------+------------------+-----+-------------------+---------+ DP       48                0.48 dampened monophasic          +---------+------------------+-----+-------------------+---------+ Great Toe0                 0.00 Absent                       +---------+------------------+-----+-------------------+---------+ +---------+------------------+-----+-------------------+-------+ Left     Lt Pressure (mmHg)IndexWaveform           Comment +---------+------------------+-----+-------------------+-------+ Brachial 101                    multiphasic                +---------+------------------+-----+-------------------+-------+ PTA      58                0.57 dampened monophasic        +---------+------------------+-----+-------------------+-------+ DP       0                 0.00 absent                     +---------+------------------+-----+-------------------+-------+ Great Toe0                 0.00 Absent                     +---------+------------------+-----+-------------------+-------+ +-------+-----------+-----------+------------+------------+ ABI/TBIToday's ABIToday's TBIPrevious ABIPrevious TBI +-------+-----------+-----------+------------+------------+ Right  0.54       absent                              +-------+-----------+-----------+------------+------------+ Left   0.57       absent                              +-------+-----------+-----------+------------+------------+  Summary: Right: Resting right  ankle-brachial index indicates moderate right lower extremity arterial disease. The right toe-brachial index is abnormal. Left: Resting left ankle-brachial index indicates moderate left lower extremity arterial disease. The left toe-brachial index is abnormal. *See table(s) above for measurements and observations.  Suggest Peripheral Vascular Consult.    Preliminary     Cardiac Studies   Right/ Left Cardiac Catheterization 10/06/2019: Chronic total occlusion of the mid right coronary, collateralized right to right and left to right. Widely patent left main. Widely patent LAD with moderate luminal irregularities proximal to distal. Widely patent stent in the proximal to mid circumflex with up to 50% narrowing distal to the stent. Moderate pulmonary hypertension.  Mean pulmonary artery  pressure 35 mmHg. Mean pulmonary capillary wedge pressure 32 mmHg.   RECOMMENDATIONS: Left ventricular systolic dysfunction is not on the basis of coronary disease.  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   Diagnostic Dominance: Right    _______________   Echocardiogram 11/02/2022 [facility]): LV: LV chamber is normal in size.  LVEF 40-45%.  Mild LVH. RV: RV moderately enlarged with moderately reduced systolic function.  Moderately elevated PASP of 54 mmHg. LA: Severely dilated. RA: Severely dilated. Aortic valve: Bioprosthetic aortic valve is in place with no significant AI.  Mean gradient 8 mmHg. Mitral valve: Mild mitral valve annular calcification.  Mild to moderate MR. Tricuspid valve: Moderate to severe TR.  Patient Profile     82 y.o. 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  He was found to have cavitary lesion in right upper lobe consistent with MRSA pneumonia and underwent thoracentesis prior to transfer to [facility].  This hospitalization has been complicated by persistent hypotension leading to endorgan damage and AKI with transaminitis.  Currently requiring BiPAP and midodrine for blood pressure support.  PCCM following.  Cardiology was initially consulted for evaluation of hypotension and help optimize him for TEE, however patient is not a candidate.  Assessment & Plan   Septic shock/MRSA bacteremia with necrotizing pneumonia/loculated pleural effusion status postthoracentesis/AKI/transaminitis/respiratory acidosis/anemia/respiratory failure PCCM on board. Requiring midodrine 10 mg 3 times daily with IV fluids.  Renal fxn stable and lactate is now normal. Transaminitis resolving. BIPAP as needed ; discussed with his nurse and primary team  CAD status post DES to left circumflex in 2011 Previously has reported no anginal symptoms.  Home Toprol-XL has been held due to hypotension.  Has required 1 unit blood transfusion yesterday 11/30/2022 so chronic anticoagulation had not been given.  Can continue ASA 81 mg daily.  LFTs improved , can start statin close to dispo  Chronic HFrEF RV function moderately reduced Moderate Pulmonary HTN; Group II and III Previous EF 20-30% in 2021. Recent echocardiogram 11/06/2022 at [facility] showed LVEF of 40 to 45% with moderately enlarged RV with moderately reduced systolic function and moderately elevated PASP of 54 mmHg.  BNP  1270.  Volume overloaded on exam. Echo during this admission shows LVEF 35 to 40% with global hypokinesis.  New grade 3 diastolic dysfunction.  Moderately reduced RV.  RVSP 56.  Severe dilatation of both atria.  IVC was dilated with decreased compressibility.  Right atrial pressure 15.  Holding GDMT given hypotension with necrotizing PNA Not in cardiogenic shock Would aim for net even with fluid status No diuresis today Recommend BIPAP for respiratory distress   Permanent atrial fibrillation  Rates 80s to 90s despite holding home Toprol-XL due to hypotension.   On PO amio for rate control Held off on heparin gtt given anemia requiring transfusion, can start if stable Hgb  History of Type B Thoracic  Aortic Dissection and Acute Aortic Insufficiency S/p initial AVR and replacement of aortic arch to the left subclavian artery in 2010 with recurrent dissection ultimately requiring carotid subclavian bypass and thoracic stent graft in 2015. Stable AVR with mean gradient of 9.21mmHg.  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   Mild to moderate MR, moderate to severe TR Continue to monitor.  Goals of care Patient has palliative care consult placed.   Overall, he is tenuous and has LV/RV dysfunction as well as a necrotizing PNA. Agree with palliative care/ GOC  For questions or updates, please contact [facility] Please consult [email or web address] for contact info under        Signed, Maisie Fus, MD  12/02/2022, 12:18 PM

## 2022-12-02 NOTE — Progress Notes (Signed)
Progress Note   Patient: [name] [name]:[id number] DOB: 17-Nov-1940 DOA: 11/28/2022     4 DOS: the patient was seen and examined on 12/02/2022   Brief hospital course: 82 year old male prior history of chronic systolic heart failure EF of 40 to 45%, history of bioprosthetic aortic valve replacement, history of aortic aneurysm, peripheral vascular disease, chronic A-fib on Eliquis, chronic wounds on his bilateral lower extremities, history of anemia, presents to the ER at [facility] yesterday for hypoxia.  He had been seen in wound care clinic and was noted to have a room air O2 saturations of 82%.  He was sent to the ER.   Last month, he was admitted to [facility] where he was diagnosed with MRSA pneumonia and septicemia.  Unclear if he had a transesophageal echocardiogram done.  He was discharged home on IV Cubicin for his septicemia and p.o. doxycycline for his MRSA pneumonia.  Reportedly he had completed his doxycycline for his MRSA pneumonia and had been continued on IV Cubicin.  in the [facility] ER, he had a CT scan which demonstrated a large loculated left-sided pleural effusion. He also had a small right pleural effusion. He also had a cavitary lesion in his right upper lobe that was consistent with his history of MRSA pneumonia. He underwent thoracentesis yielding 900cc of fluid  Pt was transferred to [facility] for formal ID evaluation  Assessment and Plan: Loculated pleural effusion with cavitary PNA and MRSA bacteremia with severe sepsis with septic shock present on admit -RH documents 900 ml of pleural fluid removed from left side thoracentesis.  -Repeat CT chest at time of admit reviewed. Moderate R layering effusion and small small residual L effusion with multifocal necrotizing PNA -Initially hypotensive with elevated lactate, ARF.  -Currently on midodrine 10mg  tid. -ID following. 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 Doubt pt would be able to tolerate TEE at  this time. ID recommended prolonged suppression with doxycycline 100mg  bid after discharge, continued on zyvox for now -Repeat CXR reviewed. Reaccumulation of large L pleural effusion noted. Discussed with ID, recs for diagnostic US thoracentesis, ordered   Acute respiratory failure with hypoxia (HCC) Secondary to his pleural effusion and cavitary PNA -Cont on supplemental o2. Needing bipap intermittently -US thoracentesis requested and is pending   R heel stage 3 pressure ulcer, present on admit -reportedly had been planned for outpatient bilateral 2nd toe amputations at [facility] which had been canceled as pt is now in hospital.  -Per Ortho, pt does not have sufficient circulation to amputate the 2nd toes.  -If pt improves, then evaluation by vascular surgery would be next option for LE   Atrial fibrillation (HCC) [name] eliquis had been on hold in anticipation for planned amputation  -toprolol XL held secondary to hypotension   Acute on Chronic systolic CHF (congestive heart failure) (HCC) Elevated pro-BNP at [facility] to 9870. Presenting BNP 1,270 -no LE edema. No evidence of pulm edema on chest CT -lasix on hold secondary to hypotension -Appreciate input by cardiology. Repeat echo with EF essentially unchanged -basal fluids on hold.   S/P AVR (aortic valve replacement) Stable.   MRSA bacteremia with severe sepsis and septic shock present on admit -Pt has been on IV cubicin at home for MRSA septicemia.  -Reportedly pt had developed renal insufficiency when on IV vancomycin. -Currently on zyvox   Normocytic anemia -Without obvious evidence of bleeding at this tiem -Hgb trends had been trending down, required 1 unit of PRBC on 6/12 with appropriate  rise in hgb -stool is heme neg -Suspect anemia secondary to acute illness/severe sepsis  Hypotension secondary to septic shock -Hold BP meds and diuretics -now on midodrine -basal IVF now stopped given concern of vol overload -recheck bmet in  AM  ARF -Cr of 1.2 on 06/23/22.  -suspect related to end-organ damage from severe sepsis -cont to hold nephrotoxic agents -Cr down to 2.87 today -recheck bmet in AM  Elevated LFT -Likely congestive hepatic liver disease  End of Life, DNR -Appreciate input by Palliative Care -Agree with overall poor prognosis  -After family meeting with Palliative, decision was made for DNR/DNI status and no escalation to ICU level care. Discussed with Palliative Care      Subjective: Difficult to ascertain. Pt not very conversant. Appears uncomfortable off bipap  Physical Exam: Vitals:   12/02/22 0700 12/02/22 1216 12/02/22 1217 12/02/22 1623  BP: 106/63   110/70  Pulse: 97 (!) 111 (!) 106 90  Resp: 20 (!) 26 (!) 30 (!) 23  Temp: 98.3 F (36.8 C)   98 F (36.7 C)  TempSrc: Oral   Oral  SpO2: 100% (!) 87% 98% 93%  Weight:       General exam: Not very conversant, appears uncomfortable Respiratory system: increased resp effort, no audible wheezing Cardiovascular system: regular rhythm, s1-s2 Gastrointestinal system: Nondistended, nontender, pos BS Central nervous system: No seizures, no tremors Extremities: No cyanosis, no joint deformities Skin: No rashes, no pallor Psychiatry: Unable to assess given acute illness  Data Reviewed:  Labs reviewed: Na 136, K 3.2, Cr 2.87, WBC 5.8, Hgb 7.9, Plts 250   Family Communication: Pt in room, family at bedside  Disposition: Status is: Inpatient Continue inpatient stay because: Severity of illness  Planned Discharge Destination:  Unclear at this time     Author: Rickey Barbara, MD 12/02/2022 5:03 PM  For on call review [email or web address].

## 2022-12-03 ENCOUNTER — Inpatient Hospital Stay (HOSPITAL_COMMUNITY): Payer: Medicare Other

## 2022-12-03 DIAGNOSIS — J90 Pleural effusion, not elsewhere classified: Secondary | ICD-10-CM | POA: Diagnosis not present

## 2022-12-03 LAB — COMPREHENSIVE METABOLIC PANEL
ALT: 56 U/L — ABNORMAL HIGH (ref 0–44)
AST: 41 U/L (ref 15–41)
Albumin: 2.3 g/dL — ABNORMAL LOW (ref 3.5–5.0)
Alkaline Phosphatase: 81 U/L (ref 38–126)
Anion gap: 13 (ref 5–15)
BUN: 42 mg/dL — ABNORMAL HIGH (ref 8–23)
CO2: 26 mmol/L (ref 22–32)
Calcium: 8 mg/dL — ABNORMAL LOW (ref 8.9–10.3)
Chloride: 95 mmol/L — ABNORMAL LOW (ref 98–111)
Creatinine, Ser: 2.61 mg/dL — ABNORMAL HIGH (ref 0.61–1.24)
GFR, Estimated: 24 mL/min — ABNORMAL LOW (ref >60)
Glucose, Bld: 114 mg/dL — ABNORMAL HIGH (ref 70–99)
Potassium: 3.8 mmol/L (ref 3.5–5.1)
Sodium: 134 mmol/L — ABNORMAL LOW (ref 135–145)
Total Bilirubin: 1 mg/dL (ref 0.3–1.2)
Total Protein: 5.5 g/dL — ABNORMAL LOW (ref 6.5–8.1)

## 2022-12-03 LAB — CULTURE, BLOOD (ROUTINE X 2)

## 2022-12-03 LAB — CBC
HCT: 27.7 % — ABNORMAL LOW (ref 39.0–52.0)
Hemoglobin: 8.3 g/dL — ABNORMAL LOW (ref 13.0–17.0)
MCH: 29 pg (ref 26.0–34.0)
MCHC: 30 g/dL (ref 30.0–36.0)
MCV: 96.9 fL (ref 80.0–100.0)
Platelets: 262 10*3/uL (ref 150–400)
RBC: 2.86 MIL/uL — ABNORMAL LOW (ref 4.22–5.81)
RDW: 18.2 % — ABNORMAL HIGH (ref 11.5–15.5)
WBC: 6.5 10*3/uL (ref 4.0–10.5)
nRBC: 0 % (ref 0.0–0.2)

## 2022-12-03 LAB — BLOOD GAS, ARTERIAL
Acid-base deficit: 2.1 mmol/L — ABNORMAL HIGH (ref 0.0–2.0)
Bicarbonate: 26 mmol/L (ref 20.0–28.0)
Drawn by: 24485
O2 Saturation: 99.4 %
Patient temperature: 37
pCO2 arterial: 58 mmHg — ABNORMAL HIGH (ref 32–48)
pH, Arterial: 7.26 — ABNORMAL LOW (ref 7.35–7.45)
pO2, Arterial: 96 mmHg (ref 83–108)

## 2022-12-03 LAB — PROTEIN, PLEURAL OR PERITONEAL FLUID: Total protein, fluid: <3 g/dL

## 2022-12-03 LAB — BODY FLUID CELL COUNT WITH DIFFERENTIAL
Eos, Fluid: 2 %
Lymphs, Fluid: 39 %
Monocyte-Macrophage-Serous Fluid: 12 % — ABNORMAL LOW (ref 50–90)
Neutrophil Count, Fluid: 47 % — ABNORMAL HIGH (ref 0–25)
Total Nucleated Cell Count, Fluid: 1351 cu mm — ABNORMAL HIGH (ref 0–1000)

## 2022-12-03 LAB — LACTATE DEHYDROGENASE, PLEURAL OR PERITONEAL FLUID: LD, Fluid: 78 U/L — ABNORMAL HIGH (ref 3–23)

## 2022-12-03 LAB — LACTATE DEHYDROGENASE: LDH: 193 U/L — ABNORMAL HIGH (ref 98–192)

## 2022-12-03 LAB — GRAM STAIN

## 2022-12-03 MED ORDER — LIDOCAINE HCL (PF) 1 % IJ SOLN
5.0000 mL | Freq: Once | INTRAMUSCULAR | Status: AC
  Administered 2022-12-03: 5 mL via INTRADERMAL

## 2022-12-03 NOTE — Progress Notes (Signed)
Progress Note   Patient: [name] [name] WUJ:[id number] DOB: [birth year] DOA: 11/28/2022     5 DOS: the patient was seen and examined on 12/03/2022   Brief hospital course: 82 year old male prior history of chronic systolic heart failure EF of 40 to 45%, history of bioprosthetic aortic valve replacement, history of aortic aneurysm, peripheral vascular disease, chronic A-fib on Eliquis, chronic wounds on his bilateral lower extremities, history of anemia, presents to the ER at [facility] yesterday for hypoxia.  He had been seen in wound care clinic and was noted to have a room air O2 saturations of 82%.  He was sent to the ER.   Last month, he was admitted to [facility] where he was diagnosed with MRSA pneumonia and septicemia.  Unclear if he had a transesophageal echocardiogram done.  He was discharged home on IV Cubicin for his septicemia and p.o. doxycycline for his MRSA pneumonia.  Reportedly he had completed his doxycycline for his MRSA pneumonia and had been continued on IV Cubicin.  in the [facility] ER, he had a CT scan which demonstrated a large loculated left-sided pleural effusion. He also had a small right pleural effusion. He also had a cavitary lesion in his right upper lobe that was consistent with his history of MRSA pneumonia. He underwent thoracentesis yielding 900cc of fluid  Pt was transferred to [facility] for formal ID evaluation  Assessment and Plan: Loculated pleural effusion with cavitary PNA and MRSA bacteremia with severe sepsis with septic shock present on admit -RH documents 900 ml of pleural fluid removed from left side thoracentesis.  -Repeat CT chest at time of admit reviewed. Moderate R layering effusion and small small residual L effusion with multifocal necrotizing PNA -Initially in shock with ARF -Currently on midodrine 10mg  tid. -ID following. 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 Doubt pt would be able to tolerate TEE at this time. ID recommended  prolonged suppression with doxycycline 100mg  bid after discharge, continued on zyvox for now -Repeat imaging with reaccumulation of L effusion, now s/p thoracentesis 6/16. Fluid analysis suggest transudate fluid. Cultures pending   Acute respiratory failure with hypoxia (HCC) -Secondary to his pleural effusion and cavitary PNA -Cont on supplemental o2. Needing bipap intermittently   R heel stage 3 pressure ulcer, present on admit -reportedly had been planned for outpatient bilateral 2nd toe amputations at [facility] which had been canceled as pt is now in hospital.  -Per Ortho, pt does not have sufficient circulation to amputate the 2nd toes.  -If pt improves, then evaluation by vascular surgery would be next option for LE   Atrial fibrillation (HCC) [name] eliquis had been on hold in anticipation for planned amputation  -toprolol XL held secondary to hypotension   Acute on Chronic systolic CHF (congestive heart failure) (HCC) Elevated pro-BNP at [facility] to 9870. Presenting BNP 1,270 -no LE edema. No evidence of pulm edema on chest CT -lasix on hold secondary to hypotension -Appreciate input by cardiology. Repeat echo with EF essentially unchanged -basal fluids on hold. -pleural fluid analysis suggestive of transudative fluid from CHF   S/P AVR (aortic valve replacement) Stable.   MRSA bacteremia with severe sepsis and septic shock present on admit -Pt has been on IV cubicin at home for MRSA septicemia.  -Reportedly pt had developed renal insufficiency when on IV vancomycin. -Currently on zyvox   Normocytic anemia -Without obvious evidence of bleeding at this tiem -Hgb trends had been trending down, required 1 unit of PRBC on 6/12 with appropriate  rise in hgb -stool is heme neg -Suspect anemia secondary to acute illness/severe sepsis  Hypotension secondary to septic shock -Hold BP meds and diuretics -now on midodrine -basal IVF now stopped given concern of vol overload -recheck bmet in  AM  ARF -Cr of 1.2 on 06/23/22.  -suspect related to end-organ damage from severe sepsis -cont to hold nephrotoxic agents -Cr down to 2.61 today -recheck bmet in AM  Elevated LFT -Likely congestive hepatic liver disease  End of Life, DNR -Appreciate input by Palliative Care -Agree with overall poor prognosis  -After family meeting with Palliative, decision was made for DNR/DNI status and no escalation to ICU level care. Discussed with Palliative Care      Subjective: Reports feeling better today. Off bipap  Physical Exam: Vitals:   12/03/22 0750 12/03/22 0900 12/03/22 1024 12/03/22 1558  BP: 123/62 123/68 (!) 109/57 122/63  Pulse: 85  89 96  Resp: (!) 22  19 20   Temp: 97.9 F (36.6 C)   98.2 F (36.8 C)  TempSrc: Oral   Oral  SpO2: 100%  100% 100%  Weight:       General exam: Awake, laying in bed, in nad Respiratory system: Normal respiratory effort, no wheezing Cardiovascular system: regular rate, s1, s2 Gastrointestinal system: Soft, nondistended, positive BS Central nervous system: CN2-12 grossly intact, strength intact Extremities: Perfused, no clubbing Skin: Normal skin turgor, no notable skin lesions seen Psychiatry: Mood normal // no visual hallucinations   Data Reviewed:  Labs reviewed: Na 134, K 3.8, Cr 2.61, WBC 6.5, Hgb 8.3   Family Communication: Pt in room, family at bedside  Disposition: Status is: Inpatient Continue inpatient stay because: Severity of illness  Planned Discharge Destination:  Unclear at this time     Author: Rickey Barbara, MD 12/03/2022 4:19 PM  For on call review [email or web address].

## 2022-12-03 NOTE — Progress Notes (Signed)
Id brief note   Patient had thoracentesis done by IR 6/16  Preliminary light's criteria appears to suggest heart failure related transudative fluid  Pending culture   -continue current abx -follow up fluid culture

## 2022-12-03 NOTE — Procedures (Signed)
PROCEDURE SUMMARY:  Successful image-guided left thoracentesis. Yielded 700 cc of dark yellow fluid. Pt tolerated procedure well. No immediate complications. EBL = trace   Specimen was sent for labs. CXR ordered.  Please see imaging section of Epic for full dictation.  Kennieth Francois PA-C 12/03/2022 9:37 AM

## 2022-12-03 NOTE — Plan of Care (Signed)
No changes from a cardiac perspective. Will follow peripherally

## 2022-12-03 NOTE — Progress Notes (Signed)
   Palliative Medicine Inpatient Follow Up Note HPI:  82 year old male prior history of chronic systolic heart failure EF of 40 to 45%, history of bioprosthetic aortic valve replacement, history of aortic aneurysm, peripheral vascular disease, chronic A-fib on Eliquis, chronic wounds on his bilateral lower extremities, history of anemia. Complicated medical course since May 13th in the setting of his MRSA pneumonia. Transferred from [facility] for additional ID support. The PMT was consulted to further assist with goals of care conversations in the setting of acute illness.   Today's Discussion 12/03/2022  *Please note that this is a verbal dictation therefore any spelling or grammatical errors are due to the "Dragon Medical One" system interpretation.  Chart reviewed inclusive of vital signs, progress notes, laboratory results, and diagnostic images.   I assessed Briana at bedside. He was noted to be on bipap. His spouse, [name] was present who shares that Mory has been trying to pull his mask of intermittently. She expresses that she has been present since 1AM. The fentanyl Chane is getting has been helpful for his breathing.   We discussed the importance of Christoval getting his thoracentesis done this morning. We reviewed if there is re-accumulation a chest tube may need to be considered though this is deferred to the pulmonary team  [name] is hopeful for improvements though very much aware of the possibility that Shreyash may decline despite current measures.   Plan to continue present care.   Questions and concerns addressed/Palliative Support Provided.   Objective Assessment: Vital Signs Vitals:   12/03/22 0326 12/03/22 0750  BP: 132/71 123/62  Pulse: 96 85  Resp: (!) 21 (!) 22  Temp: 97.8 F (36.6 C) 97.9 F (36.6 C)  SpO2: 100% 100%    Intake/Output Summary (Last 24 hours) at 12/03/2022 0919 Last data filed at 12/03/2022 1610 Gross per 24 hour  Intake --  Output 825 ml   Net -825 ml   Last Weight  Most recent update: 12/02/2022  5:23 AM    Weight  85.9 kg (189 lb 6 oz)            Gen: Elderly Caucasian male on BiPAP HEENT: Dry mucous membranes CV: Regular rate and irregular rhythm PULM: On Bipap  ABD:  nondistended  EXT: No edema  Neuro:  Sleeping  SUMMARY OF RECOMMENDATIONS   DNAR/DNI --> no escalation to ICU level of care though optimize maximal support on 6E  Thoracentesis today  Allow time for outcomes and treat the treatable  Dyspnea - Fentanyl 12.32mcg Q2H PRN   Ongoing palliative medicine team support  Billing based on MDM: Moderate  ______________________________________________________________________________________ Lamarr Lulas [place] Palliative Medicine Team Team Cell Phone: [phone] Please utilize secure chat with additional questions, if there is no response within 30 minutes please call the above phone number  Palliative Medicine Team providers are available by phone from 7am to 7pm daily and can be reached through the team cell phone.  Should this patient require assistance outside of these hours, please call the patient's attending physician.

## 2022-12-04 ENCOUNTER — Ambulatory Visit: Payer: Medicare Other | Admitting: Interventional Cardiology

## 2022-12-04 DIAGNOSIS — Z8614 Personal history of Methicillin resistant Staphylococcus aureus infection: Secondary | ICD-10-CM

## 2022-12-04 DIAGNOSIS — R7401 Elevation of levels of liver transaminase levels: Secondary | ICD-10-CM

## 2022-12-04 DIAGNOSIS — N179 Acute kidney failure, unspecified: Secondary | ICD-10-CM | POA: Diagnosis not present

## 2022-12-04 DIAGNOSIS — J90 Pleural effusion, not elsewhere classified: Secondary | ICD-10-CM | POA: Diagnosis not present

## 2022-12-04 DIAGNOSIS — I4821 Permanent atrial fibrillation: Secondary | ICD-10-CM | POA: Diagnosis not present

## 2022-12-04 DIAGNOSIS — S91102A Unspecified open wound of left great toe without damage to nail, initial encounter: Secondary | ICD-10-CM

## 2022-12-04 LAB — COMPREHENSIVE METABOLIC PANEL
ALT: 46 U/L — ABNORMAL HIGH (ref 0–44)
AST: 35 U/L (ref 15–41)
Albumin: 2.4 g/dL — ABNORMAL LOW (ref 3.5–5.0)
Alkaline Phosphatase: 75 U/L (ref 38–126)
Anion gap: 15 (ref 5–15)
BUN: 37 mg/dL — ABNORMAL HIGH (ref 8–23)
CO2: 23 mmol/L (ref 22–32)
Calcium: 8.6 mg/dL — ABNORMAL LOW (ref 8.9–10.3)
Chloride: 97 mmol/L — ABNORMAL LOW (ref 98–111)
Creatinine, Ser: 2.17 mg/dL — ABNORMAL HIGH (ref 0.61–1.24)
GFR, Estimated: 29 mL/min — ABNORMAL LOW (ref >60)
Glucose, Bld: 106 mg/dL — ABNORMAL HIGH (ref 70–99)
Potassium: 3.8 mmol/L (ref 3.5–5.1)
Sodium: 135 mmol/L (ref 135–145)
Total Bilirubin: 0.8 mg/dL (ref 0.3–1.2)
Total Protein: 5.5 g/dL — ABNORMAL LOW (ref 6.5–8.1)

## 2022-12-04 LAB — CULTURE, BLOOD (ROUTINE X 2)
Culture: NO GROWTH
Culture: NO GROWTH
Special Requests: ADEQUATE

## 2022-12-04 LAB — CBC
HCT: 29.2 % — ABNORMAL LOW (ref 39.0–52.0)
Hemoglobin: 8.6 g/dL — ABNORMAL LOW (ref 13.0–17.0)
MCH: 28.8 pg (ref 26.0–34.0)
MCHC: 29.5 g/dL — ABNORMAL LOW (ref 30.0–36.0)
MCV: 97.7 fL (ref 80.0–100.0)
Platelets: 249 10*3/uL (ref 150–400)
RBC: 2.99 MIL/uL — ABNORMAL LOW (ref 4.22–5.81)
RDW: 18.2 % — ABNORMAL HIGH (ref 11.5–15.5)
WBC: 7.9 10*3/uL (ref 4.0–10.5)
nRBC: 0 % (ref 0.0–0.2)

## 2022-12-04 LAB — CYTOLOGY - NON PAP

## 2022-12-04 MED ORDER — ENSURE ENLIVE PO LIQD
237.0000 mL | Freq: Three times a day (TID) | ORAL | Status: DC
  Administered 2022-12-04 – 2022-12-08 (×12): 237 mL via ORAL

## 2022-12-04 NOTE — Progress Notes (Signed)
Rounding Note    Patient Name: [name] [name] Date of Encounter: 12/04/2022   HeartCare Cardiologist: Lance Muss, MD   Subjective   Pt denies CP or dyspnea  Inpatient Medications    Scheduled Meds:  amiodarone  200 mg Oral Daily   aspirin  81 mg Oral Daily   Chlorhexidine Gluconate Cloth  6 each Topical Q0600   heparin  5,000 Units Subcutaneous Q8H   loratadine  10 mg Oral Daily   midodrine  10 mg Oral TID WC   mupirocin ointment  1 Application Nasal BID   pantoprazole  40 mg Oral Daily   sodium chloride flush  10-40 mL Intracatheter Q12H   tamsulosin  0.4 mg Oral Daily   Continuous Infusions:  linezolid (ZYVOX) IV 600 mg (12/03/22 2215)   PRN Meds: acetaminophen **OR** acetaminophen, albuterol, fentaNYL (SUBLIMAZE) injection, melatonin, ondansetron **OR** ondansetron (ZOFRAN) IV, sodium chloride flush, traMADol   Vital Signs    Vitals:   12/03/22 1558 12/03/22 1951 12/03/22 2357 12/04/22 0453  BP: 122/63 118/60 95/62 124/61  Pulse: 96 92 95 (!) 101  Resp: 20 20 20 20   Temp: 98.2 F (36.8 C) 98.5 F (36.9 C) (!) 97.2 F (36.2 C) 98.1 F (36.7 C)  TempSrc: Oral Oral Oral Oral  SpO2: 100% 100% 99% 94%  Weight:    84.7 kg   No intake or output data in the 24 hours ending 12/04/22 0717    12/04/2022    4:53 AM 12/02/2022    5:23 AM 12/01/2022    4:01 AM  Last 3 Weights  Weight (lbs) 186 lb 11.7 oz 189 lb 6 oz 190 lb 14.7 oz  Weight (kg) 84.7 kg 85.9 kg 86.6 kg      Telemetry    Atrial fibrillation rate upper normal - Personally Reviewed   Physical Exam   GEN: No acute distress.  Chronically ill appearing Neck: No JVD Cardiac: irregular Respiratory: diminished BS bases GI: Soft, nontender, non-distended  MS: No edema; in boots Neuro:  Nonfocal  Psych: Normal affect   Labs      Chemistry Recent Labs  Lab 12/01/22 0450 12/02/22 0500 12/03/22 0237 12/04/22 0315  NA 135 136 134* 135  K 3.4* 3.2* 3.8 3.8  CL 96* 96*  95* 97*  CO2 24 24 26 23   GLUCOSE 93 93 114* 106*  BUN 44* 44* 42* 37*  CREATININE 3.11* 2.87* 2.61* 2.17*  CALCIUM 7.6* 7.9* 8.0* 8.6*  MG 2.3  --   --   --   PROT 5.2* 5.2* 5.5* 5.5*  ALBUMIN 2.2* 2.2* 2.3* 2.4*  AST 81* 51* 41 35  ALT 86* 64* 56* 46*  ALKPHOS 85 83 81 75  BILITOT 1.0 1.2 1.0 0.8  GFRNONAA 19* 21* 24* 29*  ANIONGAP 15 16* 13 15     Hematology Recent Labs  Lab 12/02/22 0500 12/03/22 0237 12/04/22 0315  WBC 5.8 6.5 7.9  RBC 2.70* 2.86* 2.99*  HGB 7.9* 8.3* 8.6*  HCT 27.1* 27.7* 29.2*  MCV 100.4* 96.9 97.7  MCH 29.3 29.0 28.8  MCHC 29.2* 30.0 29.5*  RDW 18.2* 18.2* 18.2*  PLT 250 262 249   BNP Recent Labs  Lab 11/28/22 2146  BNP 1,270.5*     Radiology    US THORACENTESIS ASP PLEURAL SPACE W/IMG GUIDE  Result Date: 12/03/2022 INDICATION: Patient with large left pleural effusion. Request received for diagnostic and therapeutic thoracentesis EXAM: ULTRASOUND GUIDED LEFT THORACENTESIS MEDICATIONS: 8 cc 1% lidocaine COMPLICATIONS:  None immediate. PROCEDURE: An ultrasound guided thoracentesis was thoroughly discussed with the patient and questions answered. The benefits, risks, alternatives and complications were also discussed. The patient understands and wishes to proceed with the procedure. Written consent was obtained. Ultrasound was performed to localize and mark an adequate pocket of fluid in the left chest. The area was then prepped and draped in the normal sterile fashion. 1% Lidocaine was used for local anesthesia. Under ultrasound guidance a 6 Fr Safe-T-Centesis catheter was introduced. Thoracentesis was performed. The catheter was removed and a dressing applied. FINDINGS: A total of approximately 700 mL of dark yellow fluid was removed. Samples were sent to the laboratory as requested by the clinical team. IMPRESSION: Successful ultrasound guided left thoracentesis yielding 700 mL of pleural fluid. Follow-up chest x-ray revealed no evidence of  pneumothorax. Procedure performed by Mina Marble, PA-C Electronically Signed   By: Corlis Leak M.D.   On: 12/03/2022 12:40   DG Chest 1 View  Result Date: 12/03/2022 CLINICAL DATA:  Left thoracentesis EXAM: CHEST  1 VIEW COMPARISON:  12/02/2022 FINDINGS: Stable cardiomegaly status post sternotomy, cardiac valve replacement, and aortic stent graft. Moderate bilateral pleural effusions, decreased on the left. Persistent airspace opacities bilaterally. No pneumothorax. Right-sided PICC line remains in place. IMPRESSION: Moderate bilateral pleural effusions, decreased on the left status post thoracentesis. No pneumothorax. Electronically Signed   By: Duanne Guess D.O.   On: 12/03/2022 10:17   DG CHEST PORT 1 VIEW  Result Date: 12/02/2022 CLINICAL DATA:  142230 Pleural effusion 142230 EXAM: PORTABLE CHEST 1 VIEW COMPARISON:  November 28, 2022 FINDINGS: The cardiomediastinal silhouette is unchanged in contour.Status post median sternotomy head with stent graft placement throughout the aorta. RIGHT upper extremity PICC tip terminates over the superior cavoatrial junction. Large LEFT pleural effusion, similar in comparison to most recent prior. Small RIGHT pleural effusion. No pneumothorax. Diffuse bilateral airspace opacities, with mildly increased confluence in the RIGHT lateral lower lung base in comparison to prior. Similar confluent opacity of the RIGHT lateral apex. IMPRESSION: 1. Similar appearance of large LEFT and small RIGHT pleural effusions. 2. Diffuse bilateral airspace opacities, with mildly increased confluence in the RIGHT lateral lower lung base in comparison to prior. Electronically Signed   By: Meda Klinefelter M.D.   On: 12/02/2022 13:16      Patient Profile     82 y.o. 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  He was found to have cavitary lesion in right upper lobe consistent with MRSA pneumonia and underwent thoracentesis prior to transfer to [facility].  This hospitalization has been complicated by persistent hypotension leading to endorgan damage and AKI with transaminitis. Cardiology was initially consulted for evaluation of hypotension and help optimize him for TEE, however patient felt not to be a candidate.  Echocardiogram this admission shows ejection fraction 35 to 40%, moderate RV dysfunction, moderate right ventricular enlargement, moderate pulmonary hypertension, severe biatrial enlargement, moderate mitral regurgitation, moderate tricuspid regurgitation, status post  AVR with bioprosthetic aortic valve, no aortic insufficiency noted and aortic valve mean gradient 9.5 mmHg.   Assessment & Plan    1 septic shock/MRSA bacteremia with necrotizing pneumonia/loculated pleural effusion status post thoracentesis/acute kidney injury/transaminitis/respiratory failure-patient has made significant improvement.  Continue antibiotics as per infectious disease.  2 coronary artery disease-continue aspirin.  Can add statin at discharge.  3 chronic combined systolic/diastolic congestive heart failure-ejection fraction 35 to 40% this admission.  He is not volume overloaded on examination.  Presently requiring midodrine to sustain blood pressure.  Can consider addition of guideline directed medical therapy as an outpatient if his blood pressure improves.  4 permanent  atrial fibrillation-heart rate is controlled.  Continue amiodarone at present dose.  Will resume apixaban when able.  5 history of type B thoracic aortic dissection/aortic insufficiency-status post aortic valve replacement.  Transthoracic echocardiogram this admission showed normally functioning aortic valve.  6 thoracic aortic aneurysm-CT this admission shows stable aneurysmal dilatation of the descending thoracic aorta at 5 cm.  Patient would not be a candidate for repair.  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  8 transaminitis-improving.  9 No CODE BLUE.  Cardiology will follow from a distance.  For questions or updates, please contact [facility] Please consult [email or web address] for contact info under        Signed, Olga Millers, MD  12/04/2022, 7:17 AM

## 2022-12-04 NOTE — Evaluation (Signed)
Clinical/Bedside Swallow Evaluation Patient Details  Name: [name] [name] MRN: [id number] Date of Birth: 14-Jan-1941  Today's Date: 12/04/2022 Time: SLP Start Time (ACUTE ONLY): 1126 SLP Stop Time (ACUTE ONLY): 1137 SLP Time Calculation (min) (ACUTE ONLY): 11 min  Past Medical History:  Past Medical History:  Diagnosis Date   Acute on chronic diastolic heart failure (HCC) 08/22/2013   Acute on chronic respiratory failure with hypoxia (HCC) 10/03/2019   Acute on chronic systolic heart failure (HCC) 05/11/2013   EF from 35% to 50-55% on 6/14 ECHO.    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 No evidence of dissection or endoleak.  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 No evidence of abdominal aortic dissection.  Occluded proximal left subclav   Aortic valve insufficiency    Arthritis    "probably in my knees before they replaced them" (01/28/2015)   Atrial fibrillation (HCC) 05/21/2013   Atrial flutter (HCC)    Bradycardia 08/23/2013   CAD (coronary artery disease)    Chest pain 01/28/2015   CHF (congestive heart failure) (HCC)    Chronic diastolic heart failure (HCC) 01/28/2015   Descending aortic aneurysm (HCC)    DVT (deep venous thrombosis) (HCC)    ?LLE post knee surgery   Dyspnea 09/2019   HCAP (healthcare-associated pneumonia) 10/11/2013   History of blood transfusion    "after one of my knee surgeries"   HTN (hypertension)    Hyperlipidemia    Hypotension 12/17/2019   Incarcerated left inguinal hernia s/p repair 12/02/2019 12/01/2019   Insomnia 09/04/2013   Multiple fractures of ribs, right side, init  for clos fx 09/04/2019   Near syncope 01/28/2015   Pressure injury of skin 10/06/2019   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 12/18/2019 12/17/2019   Recurrent UTI 10/06/2013   Shock circulatory (HCC) 10/07/2013   Thoracic aneurysm    Tobacco abuse    Urinary retention 10/06/2013   UTI (urinary tract infection) 10/06/2013   Past Surgical History:  Past Surgical History:  Procedure Laterality Date   AORTIC VALVE REPLACEMENT  02/19/2009   AORTO-FEMORAL BYPASS GRAFT  03/2010   ascending aortic and arch aneurysm repair/notes 04/09/2009   CARDIAC VALVE REPLACEMENT     CAROTID-SUBCLAVIAN BYPASS GRAFT Left 08/26/2013   Procedure: BYPASS GRAFT CAROTID-SUBCLAVIAN;  Surgeon: Nada Libman, MD;  Location: [facility] OR;  Service: Vascular;  Laterality: Left;   CATARACT EXTRACTION W/ INTRAOCULAR LENS  IMPLANT, BILATERAL Bilateral    ENDOVASCULAR STENT INSERTION N/A 08/26/2013   Procedure:  THORACIC STENT GRAFT INSERTION;  Surgeon: Nada Libman, MD;  Location: [facility] OR;  Service: Vascular;  Laterality: N/A;   EYE SURGERY     INGUINAL HERNIA REPAIR Left 12/02/2019   Procedure: REPAIR LEFT INGUINAL HERNIA WITH MESH;  Surgeon: Violeta Gelinas, MD;  Location: [facility] OR;  Service: General;  Laterality: Left;   INGUINAL HERNIA REPAIR Left 12/18/2019   Procedure: LAPAROSCOPIC ASSISTED REPAIR OF RECURRENT INCARCERATED LEFT INGUINAL HERNIA WITH MESH; LAPAOSCOPIC REPAIR OF SEROSAL TEAR;  Surgeon: Gaynelle Adu, MD;  Location: [facility] OR;  Service: General;  Laterality: Left;   INSERTION OF MESH Left 12/02/2019  Procedure: INSERTION OF MESH;  Surgeon: Violeta Gelinas, MD;  Location[facility] OR;  Service: General;  Laterality: Left;   JOINT REPLACEMENT     KNEE ARTHROSCOPY Bilateral    REPLACEMENT TOTAL KNEE Bilateral    right groin lymphocele Right 03/29/2009   RIGHT/LEFT HEART CATH AND CORONARY ANGIOGRAPHY N/A 10/06/2019   Procedure: RIGHT/LEFT HEART CATH AND CORONARY ANGIOGRAPHY;  Surgeon: Lyn Records, MD;  Location: [facility] INVASIVE CV LAB;  Service: Cardiovascular;  Laterality: N/A;   THORACENTESIS  2010 X 2   TONSILLECTOMY     HPI:  82 y.o. male transferred to [facility] from [facility] with dx of loculated pleural effusion with cavitary PNA and MRSA bacteremia with severe sepsis with septic shock; acute respiratory failure; afib; pressure ulcer right heel stage 3; CHF. Palliative care consulted.    Assessment / Plan / Recommendation  Clinical Impression  Pt presents with no s/s of a mechanical dysphagia.  Has had poor appetite per his wife.  Oral mechanism exam was normal. Followed commands despite confusion. He demonstrated thorough mastication of solids, the appearance of a brisk swallow response, and no s/s of aspiration.  Recommend that he continue current diet to optimize food choices. No SLP f/u is needed. D/W his wife. SLP Visit Diagnosis: Dysphagia, unspecified (R13.10)    Aspiration Risk  No limitations    Diet Recommendation   Heart healthy thin liquids  Medication Administration: Whole meds with liquid    Other  Recommendations Oral Care Recommendations: Oral care BID    Recommendations for follow up therapy are one component of a multi-disciplinary discharge planning process, led by the attending physician.  Recommendations may be updated based on patient status, additional functional criteria and insurance authorization.  Follow up Recommendations No SLP follow up         Swallow Study   General HPI: 82 y.o. male transferred to [facility] from [facility] with dx of loculated pleural effusion with cavitary PNA and MRSA bacteremia with severe sepsis with septic shock; acute respiratory failure; afib; pressure ulcer right heel stage 3; CHF. Palliative care consulted. Type of Study: Bedside Swallow Evaluation Previous Swallow Assessment: no Temperature Spikes Noted: No Respiratory Status: Nasal cannula History of Recent Intubation: No Behavior/Cognition:  Alert;Confused Oral Cavity Assessment: Within Functional Limits Oral Care Completed by SLP: No Oral Cavity - Dentition: Adequate natural dentition Vision: Functional for self-feeding Self-Feeding Abilities: Needs assist Patient Positioning: Upright in bed Baseline Vocal Quality: Normal Volitional Cough: Strong Volitional Swallow: Able to elicit    Oral/Motor/Sensory Function Overall Oral Motor/Sensory Function: Within functional limits   Ice Chips Ice chips: Within functional limits   Thin Liquid Thin Liquid: Within functional limits    Nectar Thick Nectar Thick Liquid: Not tested   Honey Thick Honey Thick Liquid: Not tested   Puree Puree: Within functional limits   Solid     Solid: Within functional limits      Blenda Mounts Laurice 12/04/2022,11:40 AM  Marchelle Folks L. Samson Frederic, MA CCC/SLP Clinical Specialist - Acute Care SLP Acute Rehabilitation Services Office number [phone]

## 2022-12-04 NOTE — Progress Notes (Addendum)
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)   Acute metabolic encephalopathy   AKI (acute kidney injury) [facility])          Assessment: 82 year old male with past medical history of CAD status post DES to LCX in 02/2010, chronic combined CHF with EF as low as 25-30% in 2021 but improved to 40-45% on recent Echo in 10/2022, permanent atrial fibrillation/ flutter on Eliquis, aortic insufficiency and 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 #History of MRSA bacteremia leikly secondary to left second toe #Left second toe wound #Concern for empyema #Transaminitis #AKI #History of AVR aortic arch replacement and thoracic stent #Combined systolic/diastolic heart failure - 5/16 blood cultures positive MRSA.  5/23 continued blood cultures positive - 6/2 left second toe wound cultures growing MRSA - TTE did not show vegetation - Patient is high risk for infective endocarditis given persistent bacteremia from toe.  Seen by Dr. Dorna Bloom did not think tolerated TEE.  Plan to treat with at least 4 weeks of linezolid and then transition to suppressive doxycycline given pleural effusion with possible pneumonia/ - CT chest on 6/12 showed aortic valve replacement.  Extensive months of also CAD, multifocal pulmonary  consolidation and nodular infiltrate in keeping with multifocal necrotizing infection.  Moderate layering of pleural effusion.  Small left pleural effusion. -Thoracentesis on 6/16 700 mL dark yellow fluid showed 1350  WBC, 47% neutrophils, 12% monocytes, less than 3 protein, LDH 78, cultures no growth less than 24 hours, pathology pending  Recommendations: -Continue linezolid.  Will need at least 4 weeks followed by Doxy suppression given stent in place -Follow-up thoracentesis cultures/path Cardiology is following for heart failure and A-fib.  He is on midodrine for blood pressure.  Do not feel that patient is volume overloaded.  Will thoracentesis studies more consistent with transudative fluid with an exudative. -Seen by orthopedics on 6/1 and Dr. Lajoyce Corners, noted that ABIs show severe peripheral vascular disease.  Patient would not be candidate for surgical intervention of second toe, amputations of second toes would not heal.  If pt  significantly improves then evaluation of vascular vein surgery would be the next option.   Microbiology:   Antibiotics: Linezolid 6/11- Cultures: Blood 6/12 NG Urine 6/12 NG Centesis on 6/17 with  SUBJECTIVE: Wife at bedside.  Interval: Afebrile overngiht. Resting in bed.   Review of Systems: Review of Systems  All other systems reviewed and are negative.    Scheduled Meds:  amiodarone  200 mg Oral Daily   aspirin  81 mg Oral Daily   Chlorhexidine Gluconate Cloth  6 each Topical Q0600   feeding supplement  237 mL Oral TID BM   heparin  5,000 Units Subcutaneous Q8H   loratadine  10 mg Oral Daily   midodrine  10 mg Oral TID WC   mupirocin ointment  1 Application Nasal BID   pantoprazole  40 mg Oral Daily   sodium chloride flush  10-40 mL Intracatheter Q12H   tamsulosin  0.4 mg Oral Daily   Continuous Infusions:  linezolid (ZYVOX) IV 600 mg (12/04/22 0804)   PRN Meds:.acetaminophen **OR** acetaminophen, albuterol, fentaNYL (SUBLIMAZE)  injection, melatonin, ondansetron **OR** ondansetron (ZOFRAN) IV, sodium chloride flush, traMADol Allergies  Allergen Reactions   Lortab [Hydrocodone-Acetaminophen] Hives    No trouble breathing   Morphine And Codeine Hives   Other Other (See Comments)    Staples from surgery caused infection    OBJECTIVE: Vitals:   12/04/22 0837 12/04/22 0954 12/04/22 1157 12/04/22 1244  BP: (!) 117/55  112/63 124/81  Pulse: 99 95 94 94  Resp: (!) 25 (!) 21 (!) 29 19  Temp: 98.6 F (37 C)  98.3 F (36.8 C)   TempSrc: Oral  Oral   SpO2: 100% 100% 97% 98%  Weight:       Body mass index is 26.41 kg/m.  Physical Exam Constitutional:      General: He is not in acute distress.    Appearance: He is normal weight. He is not toxic-appearing.  HENT:     Head: Normocephalic and atraumatic.     Right Ear: External ear normal.     Left Ear: External ear normal.     Nose: No congestion or rhinorrhea.     Mouth/Throat:     Mouth: Mucous membranes are moist.     Pharynx: Oropharynx is clear.  Eyes:     Extraocular Movements: Extraocular movements intact.     Conjunctiva/sclera: Conjunctivae normal.     Pupils: Pupils are equal, round, and reactive to light.  Cardiovascular:     Rate and Rhythm: Normal rate and regular rhythm.     Heart sounds: No murmur heard.    No friction rub. No gallop.  Pulmonary:     Effort: Pulmonary effort is normal.     Breath sounds: Normal breath sounds.  Abdominal:     General: Abdomen is flat. Bowel sounds are normal.     Palpations: Abdomen is soft.  Musculoskeletal:        General: No swelling.     Cervical back: Normal range of motion and neck supple.     Comments: B/l toe wounds  Skin:    General: Skin is warm and dry.  Neurological:     General: No focal deficit present.     Mental Status: He is oriented to person, place, and time.  Psychiatric:        Mood and Affect: Mood normal.       Lab Results Lab Results  Component Value Date   WBC 7.9  12/04/2022   HGB 8.6 (L) 12/04/2022   HCT 29.2 (L) 12/04/2022   MCV 97.7 12/04/2022   PLT 249 12/04/2022    Lab Results  Component Value Date   CREATININE 2.17 (H) 12/04/2022   BUN 37 (H) 12/04/2022   NA 135 12/04/2022   K 3.8 12/04/2022   CL 97 (L) 12/04/2022   CO2 23 12/04/2022    Lab Results  Component Value Date   ALT 46 (H) 12/04/2022   AST 35 12/04/2022   ALKPHOS 75 12/04/2022   BILITOT 0.8 12/04/2022        Danelle Earthly, MD [facility] 12/04/2022, 2:17 PM   I have personally spent 58  minutes involved in face-to-face and non-face-to-face activities for this patient on the day of the visit. Professional time spent includes the following activities: Preparing to see the patient (review of tests), Obtaining and/or reviewing separately obtained history (admission/discharge record), Performing a medically appropriate examination and/or evaluation , Ordering medications/tests/procedures, referring and communicating with other health care professionals, Documenting clinical information in the EMR, Independently interpreting results (not separately reported), Communicating results to the patient/family/caregiver, Counseling and educating the patient/family/caregiver and Care coordination (not separately reported).

## 2022-12-04 NOTE — Progress Notes (Signed)
Progress Note   Patient: [name] [name]:[id number] DOB: 1940/09/24 DOA: 11/28/2022     5 DOS: the patient was seen and examined on 12/04/2022   Brief hospital course: 82 year old male prior history of chronic systolic heart failure EF of 40 to 45%, history of bioprosthetic aortic valve replacement, history of aortic aneurysm, peripheral vascular disease, chronic A-fib on Eliquis, chronic wounds on his bilateral lower extremities, history of anemia, presents to the ER at [facility] yesterday for hypoxia.  He had been seen in wound care clinic and was noted to have a room air O2 saturations of 82%.  He was sent to the ER.   Last month, he was admitted to [facility] where he was diagnosed with MRSA pneumonia and septicemia.  Unclear if he had a transesophageal echocardiogram done.  He was discharged home on IV Cubicin for his septicemia and p.o. doxycycline for his MRSA pneumonia.  Reportedly he had completed his doxycycline for his MRSA pneumonia and had been continued on IV Cubicin.  in the [facility] ER, he had a CT scan which demonstrated a large loculated left-sided pleural effusion. He also had a small right pleural effusion. He also had a cavitary lesion in his right upper lobe that was consistent with his history of MRSA pneumonia. He underwent thoracentesis yielding 900cc of fluid  Pt was transferred to [facility] for formal ID evaluation  Assessment and Plan: Loculated pleural effusion with cavitary PNA and MRSA bacteremia with severe sepsis with septic shock present on admit -RH reported 900 ml of pleural fluid removed from left side thoracentesis. Unfortunately no fluid analysis was available -Repeat CT chest at time of admit with moderate R layering effusion and small small residual L effusion with multifocal necrotizing PNA -Initially in shock and ARF -Remains on midodrine 10mg  tid. -ID following. 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 Doubt pt would be able to  tolerate TEE at this time. ID recommended prolonged suppression with doxycycline 100mg  bid after discharge, continued on zyvox for now -CXR on 6/15 with reaccumulation of L effusion, now s/p thoracentesis on 6/16. Fluid appears transudative by Light's criteria. Culture and cytology pending   Acute respiratory failure with hypoxia (HCC) -Secondary to his pleural effusion and cavitary PNA -Cont on supplemental o2. Needing bipap intermittently   R heel stage 3 pressure ulcer, present on admit -reportedly had been planned for outpatient bilateral 2nd toe amputations at [facility] which had been canceled as pt is now in hospital.  -Per Ortho, pt does not have sufficient circulation to amputate the 2nd toes.  -If pt improves, then evaluation by vascular surgery would be next option for LE   Atrial fibrillation (HCC) [name] eliquis had been on hold in anticipation for planned amputation  -toprolol XL held secondary to hypotension -now on amiodarone per Cardiology   Acute on Chronic systolic CHF (congestive heart failure) (HCC) Elevated pro-BNP at [facility] to 9870. Presenting BNP 1,270 -no LE edema. No evidence of pulm edema on chest CT -lasix on hold secondary to hypotension -Appreciate input by cardiology. Repeat echo with EF essentially unchanged   S/P AVR (aortic valve replacement) Stable.   MRSA bacteremia with severe sepsis and septic shock present on admit -Pt has been on IV cubicin at home for MRSA septicemia.  -Reportedly pt had developed renal insufficiency when on IV vancomycin. -Currently on zyvox   Normocytic anemia -Without obvious evidence of bleeding at this tiem -Hgb trends had been trending down, required 1 unit of PRBC on 6/12  with appropriate rise in hgb -stool is heme neg -Suspect anemia secondary to acute illness/severe sepsis  Hypotension secondary to septic shock -Hold BP meds and diuretics -now on midodrine -basal IVF now stopped given concern of vol overload -recheck bmet  in AM  ARF -Cr of 1.2 on 06/23/22.  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      Subjective: Not very conversant. Did report feeling better  Physical Exam: Vitals:   12/04/22 0837 12/04/22 0954 12/04/22 1157 12/04/22 1244  BP: (!) 117/55  112/63 124/81  Pulse: 99 95 94 94  Resp: (!) 25 (!) 21 (!) 29 19  Temp: 98.6 F (37 C)  98.3 F (36.8 C)   TempSrc: Oral  Oral   SpO2: 100% 100% 97% 98%  Weight:       General exam: Conversant, in no acute distress Respiratory system: normal chest rise, clear, no audible wheezing Cardiovascular system: regular rhythm, s1-s2 Gastrointestinal system: Nondistended, nontender, pos BS Central nervous system: No seizures, no tremors Extremities: No cyanosis, no joint deformities Skin: No rashes, no pallor Psychiatry: Affect normal // no auditory hallucinations   Data Reviewed:  Labs reviewed: Na 135, K 3.8, Cr 2.17, Hgb 8.6   Family Communication: Pt in room, family at bedside  Disposition: Status is: Inpatient Continue inpatient stay because: Severity of illness  Planned Discharge Destination:  Unclear at this time     Author: Rickey Barbara, MD 12/04/2022 4:10 PM  For on call review [email or web address].

## 2022-12-05 DIAGNOSIS — Z8614 Personal history of Methicillin resistant Staphylococcus aureus infection: Secondary | ICD-10-CM | POA: Diagnosis not present

## 2022-12-05 DIAGNOSIS — R7401 Elevation of levels of liver transaminase levels: Secondary | ICD-10-CM | POA: Diagnosis not present

## 2022-12-05 DIAGNOSIS — R627 Adult failure to thrive: Secondary | ICD-10-CM

## 2022-12-05 DIAGNOSIS — J90 Pleural effusion, not elsewhere classified: Secondary | ICD-10-CM | POA: Diagnosis not present

## 2022-12-05 DIAGNOSIS — R531 Weakness: Secondary | ICD-10-CM

## 2022-12-05 DIAGNOSIS — I5040 Unspecified combined systolic (congestive) and diastolic (congestive) heart failure: Secondary | ICD-10-CM

## 2022-12-05 DIAGNOSIS — N179 Acute kidney failure, unspecified: Secondary | ICD-10-CM | POA: Diagnosis not present

## 2022-12-05 LAB — COMPREHENSIVE METABOLIC PANEL
ALT: 37 U/L (ref 0–44)
AST: 28 U/L (ref 15–41)
Albumin: 2.2 g/dL — ABNORMAL LOW (ref 3.5–5.0)
Alkaline Phosphatase: 76 U/L (ref 38–126)
Anion gap: 11 (ref 5–15)
BUN: 34 mg/dL — ABNORMAL HIGH (ref 8–23)
CO2: 29 mmol/L (ref 22–32)
Calcium: 8.3 mg/dL — ABNORMAL LOW (ref 8.9–10.3)
Chloride: 97 mmol/L — ABNORMAL LOW (ref 98–111)
Creatinine, Ser: 2.01 mg/dL — ABNORMAL HIGH (ref 0.61–1.24)
GFR, Estimated: 32 mL/min — ABNORMAL LOW (ref >60)
Glucose, Bld: 126 mg/dL — ABNORMAL HIGH (ref 70–99)
Potassium: 3.6 mmol/L (ref 3.5–5.1)
Sodium: 137 mmol/L (ref 135–145)
Total Bilirubin: 0.8 mg/dL (ref 0.3–1.2)
Total Protein: 5.4 g/dL — ABNORMAL LOW (ref 6.5–8.1)

## 2022-12-05 LAB — CBC
HCT: 27.4 % — ABNORMAL LOW (ref 39.0–52.0)
Hemoglobin: 8.3 g/dL — ABNORMAL LOW (ref 13.0–17.0)
MCH: 29.2 pg (ref 26.0–34.0)
MCHC: 30.3 g/dL (ref 30.0–36.0)
MCV: 96.5 fL (ref 80.0–100.0)
Platelets: 211 10*3/uL (ref 150–400)
RBC: 2.84 MIL/uL — ABNORMAL LOW (ref 4.22–5.81)
RDW: 18.3 % — ABNORMAL HIGH (ref 11.5–15.5)
WBC: 10.6 10*3/uL — ABNORMAL HIGH (ref 4.0–10.5)
nRBC: 0 % (ref 0.0–0.2)

## 2022-12-05 LAB — CULTURE, BODY FLUID W GRAM STAIN -BOTTLE

## 2022-12-05 MED ORDER — LINEZOLID 600 MG PO TABS
600.0000 mg | ORAL_TABLET | Freq: Two times a day (BID) | ORAL | Status: DC
  Administered 2022-12-05 – 2022-12-08 (×6): 600 mg via ORAL
  Filled 2022-12-05 (×7): qty 1

## 2022-12-05 NOTE — Progress Notes (Signed)
Patient ID: [name] [name], male   DOB: [birth year], 82 y.o.   MRN: [id number]    Progress Note from the Palliative Medicine Team at [facility]   Patient Name: [name] [name]        Date: 12/05/2022 DOB: 08/15/1940  Age: 82 y.o. MRN#: [id number] Attending Physician: [name] Kief, MD Primary Care Physician: Street, [name] Coup, MD Admit Date: 11/28/2022    Extensive chart review has been completed prior to meeting with patient/family  including labs, vital signs, imaging, progress/consult notes, orders, medications and available advance directive documents.    This NP assessed patient at the bedside as a follow up to  yesterday's GOCs meeting.   82 year old male prior history of chronic systolic heart failure EF of 40 to 45%, history of bioprosthetic aortic valve replacement, history of aortic aneurysm, peripheral vascular disease, chronic A-fib on Eliquis, chronic wounds on his bilateral lower extremities, history of anemia, presents to the ER at [facility] yesterday for hypoxia.  He had been seen in wound care clinic and was noted to have a room air O2 saturations of 82%.  He was sent to the ER.   Last month, he was admitted to [facility] where he was diagnosed with MRSA pneumonia and septicemia.  Unclear if he had a transesophageal echocardiogram done.  He was discharged home on IV Cubicin for his septicemia and p.o. doxycycline for his MRSA pneumonia.  Reportedly he had completed his doxycycline for his MRSA pneumonia and had been continued on IV Cubicin.   in the [facility] ER, he had a CT scan which demonstrated a large loculated left-sided pleural effusion. He also had a small right pleural effusion. He also had a cavitary lesion in his right upper lobe that was consistent with his history of MRSA pneumonia. He underwent thoracentesis yielding 900cc of fluid   Pt was transferred to [facility] for formal ID evaluation  Patient and family face treatment option  decisions, advanced directive decisions and anticipatory care needs.   Education offered today regarding  the importance of continued conversation with family and their  medical providers regarding overall plan of care and treatment options,  ensuring decisions are within the context of the patients values and GOCs.  Questions and concerns addressed   Discussed with Dr   Time:  50 minutes  Detailed review of medical records ( labs, imaging, vital signs), medically appropriate exam ( MS, skin, resp)   discussed with treatment team, counseling and education to patient, family, staff, documenting clinical information, medication management, coordination of care    Lorinda Creed NP  Palliative Medicine Team Team Phone # [phone] Pager [phone]

## 2022-12-05 NOTE — Consult Note (Signed)
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 My associate M. Eliberto Ivory saw this patient for his LE wounds last week on 11/29/22. See her note for that Encounter. Wife and daughter in room at the time of my assessment, report that patient was supine on a stretcher in the ED at [facility] for two days. 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 First noted on Day 2 of this admission on 12/01/22.  Wound type:Pressure Pressure Injury POA: Yes Measurement:11cm x 8cm area of involvement with 4cm linear opening in the gluteal cleft. Bilateral buttock lesions in a butterfly pattern present with purple/maroon discoloration. These areas are expected to open and evolve into full thickness wounds. Wound bed:As described above Drainage (amount, consistency, odor) scant serous to serosanguinous Periwound: intact Dressing procedure/placement/frequency:I will add a mattress replacement to the POC and also a pressure redistribution chair cushion for use when OOB in chair. Topical care will be to cleanse daily and apply xeroform (antimicrobial nonadherent) topped with dry gauze and secured with silicone foam.  WOC nursing team will not follow, but will remain available to this patient, the nursing and medical teams.  Please re-consult if needed.  Thank you for inviting [place] to participate in this patient's Plan of Care.  Ladona Mow, MSN, RN, CNS, GNP, Leda Min, [employer], [employer] phone:  [phone]

## 2022-12-05 NOTE — Progress Notes (Signed)
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)   Acute metabolic encephalopathy   AKI (acute kidney injury) [facility])          Assessment: 82 year old male with past medical history of CAD status post DES to LCX in 02/2010, chronic combined CHF with EF as low as 25-30% in 2021 but improved to 40-45% on recent Echo in 10/2022, permanent atrial fibrillation/ flutter on Eliquis, aortic insufficiency and 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  #History of MRSA bacteremia leikly secondary to left second toe #Left second toe wound #Concern for empyema #Transaminitis #AKI #History of AVR aortic arch replacement and thoracic stent #Combined systolic/diastolic heart failure - 5/16 blood cultures positive MRSA.  5/23 continued blood cultures positive - 6/2 left second toe wound cultures growing MRSA - TTE did not show vegetation - Patient is high risk for infective endocarditis given persistent bacteremia from toe.  Seen by Dr. Dorna Bloom did not think tolerated TEE.  Plan to treat with at least 4 weeks of linezolid and then transition to suppressive doxycycline given pleural effusion with possible pneumonia/ - CT chest on 6/12 showed aortic valve replacement.  Extensive months of also CAD, multifocal pulmonary  consolidation and nodular infiltrate in keeping with multifocal necrotizing infection.  Moderate layering of pleural effusion.  Small left pleural effusion. -Thoracentesis on 6/16 700 mL dark yellow fluid showed 1350  WBC, 47% neutrophils, 12% monocytes, less than 3 protein, LDH 78, cultures no growth x2days, pathology showed reactive mesothelial cells with mixed acute and chronic inflammation.  c/w transudative effusion in the setting of heart failure.  Recommendations: -Continue linezolid.  Will need at least 4 weeks from negative Cx on 6/12 (EOT 7/9) followed by Doxy suppression given stent in place. F/U with Rexene Alberts on 7/2 for labs while on week 3 of linezolid.  -Seen by orthopedics on 6/1 and Dr. Lajoyce Corners, noted that ABIs show severe peripheral vascular disease.  Patient would not be candidate for surgical intervention of second toe, amputations of second toes would not heal.  If pt  significantly improves then evaluation of vascular vein surgery would be the next option.  ID will sign off    Microbiology:   Antibiotics: Linezolid 6/11- Cultures: Blood 6/12 NG Urine 6/12 NG paraentesis on 6/17 with  SUBJECTIVE: Wife at bedside.  Discussed antibiotic plan Interval: Afebrile overngiht. Resting in bed.   Review of Systems: Review of Systems  All other systems reviewed and are negative.    Scheduled Meds:  amiodarone  200 mg Oral Daily   aspirin  81 mg Oral Daily   Chlorhexidine Gluconate Cloth  6 each Topical Q0600   feeding supplement  237 mL Oral TID BM   heparin  5,000 Units Subcutaneous Q8H   loratadine  10 mg Oral Daily   midodrine  10 mg Oral TID WC   mupirocin ointment  1 Application Nasal BID   pantoprazole  40 mg Oral Daily   sodium chloride flush  10-40 mL Intracatheter Q12H   tamsulosin  0.4 mg Oral Daily   Continuous Infusions:  linezolid (ZYVOX) IV 600 mg (12/05/22 1051)   PRN Meds:.acetaminophen **OR** acetaminophen, albuterol, fentaNYL (SUBLIMAZE)  injection, melatonin, ondansetron **OR** ondansetron (ZOFRAN) IV, sodium chloride flush, traMADol Allergies  Allergen Reactions   Lortab [Hydrocodone-Acetaminophen] Hives    No trouble breathing   Morphine And Codeine Hives   Other Other (See Comments)    Staples from surgery caused infection    OBJECTIVE: Vitals:   12/04/22 2330 12/05/22 0323 12/05/22 0442 12/05/22 0809  BP: 110/60  117/78 111/64  Pulse: 91 (!) 104 88 100  Resp: 20 (!) 29 (!) 24 (!) 31  Temp: 97.6 F (36.4 C)  98.9 F (37.2 C) 98.7 F (37.1 C)  TempSrc: Oral  Axillary Oral  SpO2: 100% 100% 100% 100%  Weight:   83 kg    Body mass index is 25.88 kg/m.  Physical Exam Constitutional:      General: He is not in acute distress.    Appearance: He is normal weight. He is not toxic-appearing.  HENT:     Head: Normocephalic and atraumatic.     Right Ear: External ear normal.     Left Ear: External ear normal.     Nose: No congestion or rhinorrhea.     Mouth/Throat:     Mouth: Mucous membranes are moist.     Pharynx: Oropharynx is clear.  Eyes:     Extraocular Movements: Extraocular movements intact.     Conjunctiva/sclera: Conjunctivae normal.     Pupils: Pupils are equal, round, and reactive to light.  Cardiovascular:     Rate and Rhythm: Normal rate and regular rhythm.     Heart sounds: No murmur heard.    No friction rub. No gallop.  Pulmonary:     Effort: Pulmonary effort is normal.     Breath sounds: Normal breath sounds.  Abdominal:     General: Abdomen is flat. Bowel sounds are normal.     Palpations: Abdomen is soft.  Musculoskeletal:        General: No swelling.     Cervical back: Normal range of motion and neck supple.     Comments: B/l toe wounds  Skin:    General: Skin is warm and dry.  Neurological:     General: No focal deficit present.     Mental Status: He is oriented to person, place, and time.  Psychiatric:        Mood and Affect: Mood normal.       Lab Results Lab  Results  Component Value Date   WBC 10.6 (H) 12/05/2022   HGB 8.3 (L) 12/05/2022   HCT 27.4 (L) 12/05/2022   MCV 96.5 12/05/2022   PLT 211 12/05/2022    Lab Results  Component Value Date   CREATININE 2.01 (H) 12/05/2022   BUN 34 (H) 12/05/2022   NA 137 12/05/2022   K 3.6 12/05/2022   CL 97 (L) 12/05/2022   CO2 29 12/05/2022    Lab Results  Component Value Date   ALT 37 12/05/2022   AST 28 12/05/2022   ALKPHOS 76 12/05/2022   BILITOT 0.8 12/05/2022        Danelle Earthly, MD [facility] 12/05/2022, 12:42 PM   I have personally spent 52 minutes involved in face-to-face and non-face-to-face activities for this patient on the day of the visit. Professional time spent includes the following activities: Preparing to see the patient (review of tests), Obtaining and/or reviewing separately obtained history (admission/discharge record), Performing a medically appropriate examination and/or evaluation , Ordering medications/tests/procedures, referring and communicating with other health care professionals, Documenting clinical information in the EMR, Independently interpreting results (not separately reported), Communicating results to the patient/family/caregiver, Counseling and educating the patient/family/caregiver and Care coordination (not separately reported).

## 2022-12-05 NOTE — Progress Notes (Signed)
Progress Note   Patient: [name] [name]:[id number] DOB: 1940/12/19 DOA: 11/28/2022     6 DOS: the patient was seen and examined on 12/05/2022   Brief hospital course: 82 year old male prior history of chronic systolic heart failure EF of 40 to 45%, history of bioprosthetic aortic valve replacement, history of aortic aneurysm, peripheral vascular disease, chronic A-fib on Eliquis, chronic wounds on his bilateral lower extremities, history of anemia, presents to the ER at [facility] yesterday for hypoxia.  He had been seen in wound care clinic and was noted to have a room air O2 saturations of 82%.  He was sent to the ER.   Last month, he was admitted to [facility] where he was diagnosed with MRSA pneumonia and septicemia.  Unclear if he had a transesophageal echocardiogram done.  He was discharged home on IV Cubicin for his septicemia and p.o. doxycycline for his MRSA pneumonia.  Reportedly he had completed his doxycycline for his MRSA pneumonia and had been continued on IV Cubicin.  in the [facility] ER, he had a CT scan which demonstrated a large loculated left-sided pleural effusion. He also had a small right pleural effusion. He also had a cavitary lesion in his right upper lobe that was consistent with his history of MRSA pneumonia. He underwent thoracentesis yielding 900cc of fluid  Pt was transferred to [facility] for formal ID evaluation  Assessment and Plan: Loculated pleural effusion with cavitary PNA and MRSA bacteremia with severe sepsis with septic shock present on admit -RH reported 900 ml of pleural fluid removed from left side thoracentesis. Unfortunately no fluid analysis was available -Repeat CT chest at time of admit with moderate R layering effusion and small small residual L effusion with multifocal necrotizing PNA -Initially in shock and in ARF -Remains on midodrine 10mg  tid. -ID following. 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 Doubt pt would be able to  tolerate TEE at this time. ID recommended prolonged suppression with doxycycline 100mg  bid after discharge, continued on zyvox for now -CXR on 6/15 with reaccumulation of L effusion, now s/p thoracentesis on 6/16. Fluid appears transudative by Light's criteria. Culture and cytology pending   Acute respiratory failure with hypoxia (HCC) -Secondary to his pleural effusion and cavitary PNA -Cont on supplemental o2. Needing bipap intermittently   R heel stage 3 pressure ulcer, present on admit -reportedly had been planned for outpatient bilateral 2nd toe amputations at [facility] which had been canceled as pt is now in hospital.  -Per Ortho, pt does not have sufficient circulation to amputate the 2nd toes.  -If pt improves, then evaluation by vascular surgery would be next option for LE   Atrial fibrillation (HCC) [name] eliquis had been on hold in anticipation for planned amputation  -toprolol XL held secondary to hypotension -now on amiodarone per Cardiology -Stable thus far   Acute on Chronic systolic CHF (congestive heart failure) (HCC) Elevated [facility]-BNP at [facility] to 9870. Presenting BNP 1,270 -no LE edema. No evidence of pulm edema on chest CT -lasix on hold secondary to hypotension -Appreciate input by cardiology. Repeat echo with EF essentially unchanged   S/P AVR (aortic valve replacement) Remains stable Per above, ID is concerned about PV endocardiits. However, given pt's tenuous status, TEE was not recommended -ID recommends prolonged abx   MRSA bacteremia with severe sepsis and septic shock present on admit -Pt has been on IV cubicin at home for MRSA septicemia.  -Reportedly pt had developed renal insufficiency when on IV vancomycin. -Currently on  zyvox per above   Normocytic anemia -Without obvious evidence of bleeding at this tiem -Hgb trends had been trending down, required 1 unit of PRBC on 6/12 with appropriate rise in hgb -stool is heme neg -Suspect anemia secondary to acute  illness/severe sepsis -Hgb is stable  Hypotension secondary to septic shock -Hold BP meds and diuretics -now on midodrine, would try to wean down as tolerated -recheck bmet in AM  ARF -Cr of 1.2 on 06/23/22.  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      Subjective: Reports feeling better today  Physical Exam: Vitals:   12/04/22 2330 12/05/22 0323 12/05/22 0442 12/05/22 0809  BP: 110/60  117/78 111/64  Pulse: 91 (!) 104 88 100  Resp: 20 (!) 29 (!) 24 (!) 31  Temp: 97.6 F (36.4 C)  98.9 F (37.2 C) 98.7 F (37.1 C)  TempSrc: Oral  Axillary Oral  SpO2: 100% 100% 100% 100%  Weight:   83 kg    General exam: Awake, laying in bed, in nad Respiratory system: Increased respiratory effort, no wheezing Cardiovascular system: regular rate, s1, s2 Gastrointestinal system: Soft, nondistended, positive BS Central nervous system: CN2-12 grossly intact, strength intact Extremities: Perfused, no clubbing Skin: Normal skin turgor, no notable skin lesions seen Psychiatry:difficult to assess given mentation  Data Reviewed:  Labs reviewed: Na 137, K 3.6, Cr 2.01, WBC 10.6, hgb 8.3   Family Communication: Pt in room, family not at bedside  Disposition: Status is: Inpatient Continue inpatient stay because: Severity of illness  Planned Discharge Destination:  Unclear at this time     Author: Rickey Barbara, MD 12/05/2022 2:24 PM  For on call review [email or web address].

## 2022-12-05 NOTE — Plan of Care (Signed)

## 2022-12-06 DIAGNOSIS — J90 Pleural effusion, not elsewhere classified: Secondary | ICD-10-CM | POA: Diagnosis not present

## 2022-12-06 LAB — COMPREHENSIVE METABOLIC PANEL
ALT: 31 U/L (ref 0–44)
AST: 27 U/L (ref 15–41)
Albumin: 2.2 g/dL — ABNORMAL LOW (ref 3.5–5.0)
Alkaline Phosphatase: 72 U/L (ref 38–126)
Anion gap: 9 (ref 5–15)
BUN: 35 mg/dL — ABNORMAL HIGH (ref 8–23)
CO2: 30 mmol/L (ref 22–32)
Calcium: 8.5 mg/dL — ABNORMAL LOW (ref 8.9–10.3)
Chloride: 99 mmol/L (ref 98–111)
Creatinine, Ser: 1.73 mg/dL — ABNORMAL HIGH (ref 0.61–1.24)
GFR, Estimated: 39 mL/min — ABNORMAL LOW (ref >60)
Glucose, Bld: 118 mg/dL — ABNORMAL HIGH (ref 70–99)
Potassium: 3.7 mmol/L (ref 3.5–5.1)
Sodium: 138 mmol/L (ref 135–145)
Total Bilirubin: 0.8 mg/dL (ref 0.3–1.2)
Total Protein: 5.4 g/dL — ABNORMAL LOW (ref 6.5–8.1)

## 2022-12-06 LAB — CBC
HCT: 28.6 % — ABNORMAL LOW (ref 39.0–52.0)
Hemoglobin: 8.4 g/dL — ABNORMAL LOW (ref 13.0–17.0)
MCH: 29 pg (ref 26.0–34.0)
MCHC: 29.4 g/dL — ABNORMAL LOW (ref 30.0–36.0)
MCV: 98.6 fL (ref 80.0–100.0)
Platelets: 179 10*3/uL (ref 150–400)
RBC: 2.9 MIL/uL — ABNORMAL LOW (ref 4.22–5.81)
RDW: 18.6 % — ABNORMAL HIGH (ref 11.5–15.5)
WBC: 11.1 10*3/uL — ABNORMAL HIGH (ref 4.0–10.5)
nRBC: 0.2 % (ref 0.0–0.2)

## 2022-12-06 NOTE — Plan of Care (Signed)

## 2022-12-06 NOTE — Evaluation (Signed)
Physical Therapy Evaluation Patient Details Name: ISAIA CHADBOURNE MRN: [id number] DOB: 03-Jan-1941 Today's Date: 12/06/2022  History of Present Illness  Pt is an 82 year old male admitted on 11/28/22 for hypoxia.  Prior history of chronic systolic heart failure EF of 40 to 45%, history of bioprosthetic aortic valve replacement, history of aortic aneurysm, peripheral vascular disease, chronic A-fib on Eliquis, chronic wounds on his bilateral lower extremities, history of anemia.  Clinical Impression  Pt presents with admitting diagnosis above. Today pt required Mod A for all mobility. At baseline family reports that pt is Mod I with SPC and has several family members within walking distance from home. Recommend SNF upon DC. PT will continue to follow.     Recommendations for follow up therapy are one component of a multi-disciplinary discharge planning process, led by the attending physician.  Recommendations may be updated based on patient status, additional functional criteria and insurance authorization.  Follow Up Recommendations Can patient physically be transported by private vehicle: No     Assistance Recommended at Discharge Frequent or constant Supervision/Assistance  Patient can return home with the following  A lot of help with walking and/or transfers;A lot of help with bathing/dressing/bathroom;Assistance with cooking/housework;Direct supervision/assist for medications management;Assist for transportation;Help with stairs or ramp for entrance    Equipment Recommendations Other (comment) (Per accepting facility)  Recommendations for Other Services       Functional Status Assessment Patient has had a recent decline in their functional status and demonstrates the ability to make significant improvements in function in a reasonable and predictable amount of time.     Precautions / Restrictions Precautions Precautions: Fall;Other (comment) Precaution Comments:  Contact Restrictions Weight Bearing Restrictions: No      Mobility  Bed Mobility Overal bed mobility: Needs Assistance Bed Mobility: Supine to Sit     Supine to sit: Mod assist     General bed mobility comments: Assistance with trunk elevation    Transfers Overall transfer level: Needs assistance Equipment used: Rolling walker (2 wheels) Transfers: Sit to/from Stand, Bed to chair/wheelchair/BSC Sit to Stand: Mod assist   Step pivot transfers: Mod assist       General transfer comment: Cues for hand placement and safety with RW.    Ambulation/Gait               General Gait Details: Deferred for safety  Stairs            Wheelchair Mobility    Modified Rankin (Stroke Patients Only)       Balance Overall balance assessment: Needs assistance Sitting-balance support: Feet supported, Bilateral upper extremity supported Sitting balance-Leahy Scale: Fair     Standing balance support: Bilateral upper extremity supported, During functional activity, Reliant on assistive device for balance Standing balance-Leahy Scale: Poor Standing balance comment: Reliant on RW                             Pertinent Vitals/Pain Pain Assessment Pain Assessment: No/denies pain    Home Living Family/patient expects to be discharged to:: Private residence Living Arrangements: Spouse/significant other Available Help at Discharge: Family;Available 24 hours/day (Family is all within walking distance) Type of Home: Mobile home Home Access: Stairs to enter Entrance Stairs-Rails: Right Entrance Stairs-Number of Steps: 4   Home Layout: One level Home Equipment: Agricultural consultant (2 wheels);Shower seat;Cane - single point;Rollator (4 wheels);Grab bars - tub/shower;Toilet riser;Hand held shower head;Other (comment) (Lift chair)  Prior Function Prior Level of Function : Independent/Modified Independent;History of Falls (last six months) (Pt wife reports pt has  slid out of bed too many times to count within last 6 months)             Mobility Comments: Pt used SPC during day and RW at night. ADLs Comments: ind     Hand Dominance   Dominant Hand: Right    Extremity/Trunk Assessment   Upper Extremity Assessment Upper Extremity Assessment: Generalized weakness    Lower Extremity Assessment Lower Extremity Assessment: Generalized weakness    Cervical / Trunk Assessment Cervical / Trunk Assessment: Normal  Communication   Communication: HOH  Cognition Arousal/Alertness: Awake/alert Behavior During Therapy: WFL for tasks assessed/performed Overall Cognitive Status: Within Functional Limits for tasks assessed                                          General Comments General comments (skin integrity, edema, etc.): Pt initially received on 2L at 100%. Pt placed on RA and remained between 91-93% during transfer. Once in chair pt dropped to 89% and placed back on 2L and recovered to 94% with cues for pursed lip breathing.    Exercises     Assessment/Plan    PT Assessment Patient needs continued PT services  PT Problem List Decreased strength;Decreased range of motion;Decreased activity tolerance;Decreased balance;Decreased mobility;Decreased coordination;Decreased knowledge of use of DME;Decreased safety awareness;Decreased knowledge of precautions;Cardiopulmonary status limiting activity;Decreased skin integrity       PT Treatment Interventions DME instruction;Gait training;Stair training;Functional mobility training;Therapeutic activities;Therapeutic exercise;Balance training;Neuromuscular re-education;Patient/family education    PT Goals (Current goals can be found in the Care Plan section)  Acute Rehab PT Goals Patient Stated Goal: to go home PT Goal Formulation: With patient Time For Goal Achievement: 12/20/22 Potential to Achieve Goals: Fair    Frequency Min 1X/week     Co-evaluation                AM-PAC PT "6 Clicks" Mobility  Outcome Measure Help needed turning from your back to your side while in a flat bed without using bedrails?: A Lot Help needed moving from lying on your back to sitting on the side of a flat bed without using bedrails?: A Lot Help needed moving to and from a bed to a chair (including a wheelchair)?: A Lot Help needed standing up from a chair using your arms (e.g., wheelchair or bedside chair)?: A Lot Help needed to walk in hospital room?: Total Help needed climbing 3-5 steps with a railing? : Total 6 Click Score: 10    End of Session Equipment Utilized During Treatment: Gait belt;Oxygen Activity Tolerance: Patient limited by fatigue;Patient tolerated treatment well Patient left: in chair;with call bell/phone within reach;with family/visitor present Nurse Communication: Mobility status PT Visit Diagnosis: Other abnormalities of gait and mobility (R26.89)    Time: 1610-9604 PT Time Calculation (min) (ACUTE ONLY): 43 min   Charges:   PT Evaluation $PT Eval Moderate Complexity: 1 Mod PT Treatments $Therapeutic Activity: 23-37 mins        Shela Nevin, PT, DPT Acute Rehab Services [phone]   Gladys Damme 12/06/2022, 5:26 PM

## 2022-12-06 NOTE — Care Management Important Message (Signed)
Important Message  Patient Details  Name: [name] [name] MRN: [id number] Date of Birth: 09-17-1940   Medicare Important Message Given:  Yes     Sherilyn Banker 12/06/2022, 4:36 PM

## 2022-12-06 NOTE — Progress Notes (Addendum)
PROGRESS NOTE  [name] [name]  NUU:[id number] DOB: [birth year] DOA: 11/28/2022 PCP: Casper Harrison, [name], MD   Brief Narrative: Patient is 82 year old male with history of chronic systolic CHF, bioprosthetic aortic valve replacement, aortic aneurysm, peripheral vascular disease, chronic A-fib on Eliquis, chronic wounds on bilateral lower extremities, anemia who was transferred from [facility] for evaluation of hypoxia.  He was initially seen at wound care clinic and was noted to be hypoxic and was sent to the emergency department.  He was initially diagnosed at the [facility] to have MRSA pneumonia and septicemia  and was discharged home on IV abx and oral doxycycline.  On repeat presentation to the emergency department at [facility], CT scan showed large loculated left-sided pleural effusion, cavitary lesion in the right upper lobe.  Patient was transferred to [facility]. ID was consulted here.  Prolonged hospital course.  Cardiology was also following.  Now clinically improving.  PT/OT consulted today.  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  No fluid analysis was available.  Repeat CT showed moderate right-sided layering effusion, multifocal necrotizing pneumonia.  Initially on septic shock and acute renal failure.  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  Patient was  suspected that he cannot tolerate TEE, ID recommended prolonged suppression with antibiotics.  Currently on Zyvox.  Recommended 4 weeks from 6/12 followed by doxycycline suppression.  Patient will follow-up with ID as an outpatient. Underwent repeat thoracentesis on 6/16.  Cultures have not shown any growth, cytology showed mesothelial cells  Acute respiratory failure with hypoxia: Secondary to pleural effusion, cavitary pneumonia.  Continue supplemental oxygen as needed.  Needing BiPAP intermittently.He was not on oxygen at home.  He probably needs oxygen on discharge.  Permanent A-fib: Currently heart rate is well-controlled.  On amiodarone.  Will resume Eliquis when possible.  Toprol on hold due to hypotension  Coronary artery disease: Continue aspirin, plan for starting on statin on discharge.No chest pain currently  Chronic combined systolic/diastolic CHF: EF of 35 to 40%.  Not volume overloaded.  Currently requiring midodrine to sustain blood pressure.  Cardiology following.  Lasix on hold due to hypotension.  History of thoracic aortic dissection/aortic insufficiency: Status post aortic valve replacement.  TTE on this admission showed normal functioning aortic valve.  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  Patient is not a candidate for repair  AKI on  CKD stage IIIb: Creatinine continues to improve and is currently close to baseline.  Baseline creatinine of 1.5  Normocytic anemia: Currently no evidence of acute blood loss.  Continue to monitor hemoglobin.  Elevated liver enzymes: Currently stable, suspected to be from congestive hepatic liver disease  Goals of care: Elderly male with multiple problems.  Currently DNR.  Palliative care was following.  Palliative care met with the patient, no escalation of treatment.  Pressure Injury 11/29/22 Heel Right  Stage 3 -  Full thickness tissue loss. Subcutaneous fat may be visible but bone,  tendon or muscle are NOT exposed. (Active)  11/29/22 1046  Location: Heel  Location Orientation: Right  Staging: Stage 3 -  Full thickness tissue loss. Subcutaneous fat may be visible but bone, tendon or muscle are NOT exposed.  Wound Description (Comments):   Present on Admission: Yes     Pressure Injury 12/01/22 Buttocks Bilateral Deep Tissue Pressure Injury - Purple or maroon localized area of discolored intact skin or blood-filled blister due to damage of underlying soft tissue from pressure and/or shear. (Active)  12/01/22 1745  Location: Buttocks  Location Orientation: Bilateral  Staging: Deep Tissue Pressure Injury - Purple or maroon localized area of discolored intact skin or blood-filled blister due to damage of underlying soft tissue from pressure and/or shear.  Wound Description (Comments):   Present on Admission:      Pressure Injury 12/04/22 Coccyx Stage 2 -  Partial thickness loss of dermis presenting as a shallow open injury with a red, pink wound bed without slough. (Active)  12/04/22 2010  Location: Coccyx  Location Orientation:   Staging: Stage 2 -  Partial thickness loss of dermis presenting as a shallow open injury with a red, pink wound bed without slough.  Wound Description (Comments):   Present on Admission: Yes     Pressure Injury 12/04/22 Buttocks Bilateral Stage 2 -  Partial thickness loss of dermis presenting as a shallow open injury with a red, pink wound bed without slough. (Active)  12/04/22 2010  Location: Buttocks  Location Orientation: Bilateral  Staging: Stage 2 -  Partial thickness loss of dermis presenting as a shallow open injury with a red, pink wound bed without slough.  Wound Description (Comments):   Present on Admission: Yes          Nutrition Problem: Inadequate oral intake Etiology: inability to eat Pressure Injury 11/29/22 Heel Right Stage 3 -  Full thickness tissue loss. Subcutaneous fat may be visible but bone, tendon or muscle  are NOT exposed. (Active)  11/29/22 1046  Location: Heel  Location Orientation: Right  Staging: Stage 3 -  Full thickness tissue loss. Subcutaneous fat may be visible but bone, tendon or muscle are NOT exposed.  Wound Description (Comments):   Present on Admission: Yes  Dressing Type Foam - Lift dressing to assess site every shift 12/05/22 2000     Pressure Injury 12/01/22 Buttocks Bilateral Deep Tissue Pressure Injury - Purple or maroon localized area of discolored intact skin or blood-filled blister due to damage of underlying soft tissue from pressure and/or shear. (Active)  12/01/22 1745  Location: Buttocks  Location Orientation: Bilateral  Staging: Deep Tissue Pressure Injury - Purple or maroon localized area of discolored intact skin or blood-filled blister due to damage of underlying soft tissue from pressure and/or shear.  Wound Description (Comments):   Present on Admission:   Dressing Type Foam - Lift dressing to assess site every shift 12/05/22 2000     Pressure Injury 12/04/22 Coccyx Stage 2 -  Partial thickness loss of dermis presenting as a shallow open injury with a red, pink wound bed without slough. (Active)  12/04/22 2010  Location: Coccyx  Location Orientation:   Staging: Stage 2 -  Partial thickness loss of dermis presenting as a shallow open injury with a red, pink wound bed without slough.  Wound Description (Comments):   Present on Admission: Yes  Dressing Type Foam - Lift dressing to assess  site every shift 12/05/22 2000     Pressure Injury 12/04/22 Buttocks Bilateral Stage 2 -  Partial thickness loss of dermis presenting as a shallow open injury with a red, pink wound bed without slough. (Active)  12/04/22 2010  Location: Buttocks  Location Orientation: Bilateral  Staging: Stage 2 -  Partial thickness loss of dermis presenting as a shallow open injury with a red, pink wound bed without slough.  Wound Description (Comments):   Present on Admission: Yes   Dressing Type Foam - Lift dressing to assess site every shift 12/05/22 2000    DVT prophylaxis:heparin injection 5,000 Units Start: 11/28/22 2200 SCDs Start: 11/28/22 2057     Code Status: DNR  Family Communication: Discussed with wife at bedside on 6/19  Patient status:Inpatient  Patient is from :Home  Anticipated discharge WU:JWJX vs SNF  Estimated DC date:2-3 days   Consultants: ID, cardiology, palliative care  Procedures: Thoracentesis  Antimicrobials:  Anti-infectives (From admission, onward)    Start     Dose/Rate Route Frequency Ordered Stop   12/05/22 2200  linezolid (ZYVOX) tablet 600 mg        600 mg Oral Every 12 hours 12/05/22 1517 12/26/22 2359   11/28/22 2200  linezolid (ZYVOX) IVPB 600 mg  Status:  Discontinued        600 mg 300 mL/hr over 60 Minutes Intravenous Every 12 hours 11/28/22 2050 12/05/22 1517       Subjective: Patient seen and examined the bedside today.  Hemodynamically stable.  Family at bedside.  Very weak, deconditioned.  Denies any shortness of breath.  On 2 to 3 L of oxygen per minute.  Had a bowel movement today.  Heart rate well-controlled.  Patient likes to ambulate.  Objective: Vitals:   12/06/22 0120 12/06/22 0312 12/06/22 0500 12/06/22 0810  BP:  110/78  (!) 129/59  Pulse: 88 85  87  Resp: (!) 22 20  (!) 27  Temp:  98.1 F (36.7 C)  98.8 F (37.1 C)  TempSrc:  Oral  Oral  SpO2: 100% 99%  99%  Weight:   84.7 kg     Intake/Output Summary (Last 24 hours) at 12/06/2022 0900 Last data filed at 12/06/2022 0811 Gross per 24 hour  Intake 10 ml  Output 1000 ml  Net -990 ml   Filed Weights   12/04/22 0453 12/05/22 0442 12/06/22 0500  Weight: 84.7 kg 83 kg 84.7 kg    Examination:  General exam: Overall comfortable, not in distress, very weak, chronically ill looking HEENT: PERRL Respiratory system: Diminished sounds bilaterally, no wheezes or crackles  Cardiovascular system: Irregular regular rhythm Gastrointestinal  system: Abdomen is nondistended, soft and nontender. Central nervous system: Alert and oriented Extremities: No edema, no clubbing ,no cyanosis Skin: No rashes,no icterus, ulcers on bilateral foot   Data Reviewed: I have personally reviewed following labs and imaging studies  CBC: Recent Labs  Lab 12/02/22 0500 12/03/22 0237 12/04/22 0315 12/05/22 0500 12/06/22 0539  WBC 5.8 6.5 7.9 10.6* 11.1*  HGB 7.9* 8.3* 8.6* 8.3* 8.4*  HCT 27.1* 27.7* 29.2* 27.4* 28.6*  MCV 100.4* 96.9 97.7 96.5 98.6  PLT 250 262 249 211 179   Basic Metabolic Panel: Recent Labs  Lab 12/01/22 0450 12/02/22 0500 12/03/22 0237 12/04/22 0315 12/05/22 0500 12/06/22 0539  NA 135 136 134* 135 137 138  K 3.4* 3.2* 3.8 3.8 3.6 3.7  CL 96* 96* 95* 97* 97* 99  CO2 24 24 26 23 29 30   GLUCOSE 93  93 114* 106* 126* 118*  BUN 44* 44* 42* 37* 34* 35*  CREATININE 3.11* 2.87* 2.61* 2.17* 2.01* 1.73*  CALCIUM 7.6* 7.9* 8.0* 8.6* 8.3* 8.5*  MG 2.3  --   --   --   --   --      Recent Results (from the past 240 hour(s))  MRSA Next Gen by PCR, Nasal     Status: Abnormal   Collection Time: 11/29/22  5:06 AM   Specimen: Nasal Mucosa; Nasal Swab  Result Value Ref Range Status   MRSA by PCR Next Gen DETECTED (A) NOT DETECTED Final    Comment: RESULT CALLED TO, READ BACK BY AND VERIFIED WITH: B HYDLEBURG,RN@0700  11/29/22 MK (NOTE) The GeneXpert MRSA Assay (FDA approved for NASAL specimens only), is one component of a comprehensive MRSA colonization surveillance program. It is not intended to diagnose MRSA infection nor to guide or monitor treatment for MRSA infections. Test performance is not FDA approved in patients less than 93 years old. Performed at [facility], [address], [place], [place] [address]   Culture, blood (x 2)     Status: None   Collection Time: 11/29/22  9:06 AM   Specimen: BLOOD RIGHT ARM  Result Value Ref Range Status   Specimen Description BLOOD RIGHT ARM  Final   Special  Requests   Final    BOTTLES DRAWN AEROBIC ONLY Blood Culture adequate volume   Culture   Final    NO GROWTH 5 DAYS Performed at [facility], [address], [place], [place] [address]    Report Status 12/04/2022 FINAL  Final  Culture, blood (Routine X 2) w Reflex to ID Panel     Status: None   Collection Time: 11/29/22  9:17 AM   Specimen: BLOOD RIGHT ARM  Result Value Ref Range Status   Specimen Description BLOOD RIGHT ARM  Final   Special Requests BOTTLES DRAWN AEROBIC ONLY  Final   Culture   Final    NO GROWTH 5 DAYS Performed at [facility], [address] [place], [place] [address]    Report Status 12/04/2022 FINAL  Final  Urine Culture     Status: None   Collection Time: 11/29/22  6:00 PM   Specimen: Urine, Clean Catch  Result Value Ref Range Status   Specimen Description URINE, CLEAN CATCH  Final   Special Requests NONE Reflexed from [id number]  Final   Culture   Final    NO GROWTH Performed at [facility], [address] [place], [place] [address]    Report Status 11/30/2022 FINAL  Final  Culture, body fluid w Gram Stain-bottle     Status: None (Preliminary result)   Collection Time: 12/03/22  9:36 AM   Specimen: Fluid  Result Value Ref Range Status   Specimen Description FLUID PLEURAL  Final   Special Requests   Final    BOTTLES DRAWN AEROBIC AND ANAEROBIC Blood Culture adequate volume   Culture   Final    NO GROWTH 3 DAYS Performed at [facility], [address], [place], [place] [address]    Report Status PENDING  Incomplete  Gram stain     Status: None   Collection Time: 12/03/22  9:36 AM   Specimen: Fluid  Result Value Ref Range Status   Specimen Description FLUID PLEURAL  Final   Special Requests SYRINGE  Final   Gram Stain   Final    RARE WBC  SEEN NO ORGANISMS SEEN Performed at [facility], [address] [place], [place] [address]    Report Status 12/03/2022 FINAL  Final     Radiology Studies: No results  found.  Scheduled Meds:  amiodarone  200 mg Oral Daily   aspirin  81 mg Oral Daily   Chlorhexidine Gluconate Cloth  6 each Topical Q0600   feeding supplement  237 mL Oral TID BM   heparin  5,000 Units Subcutaneous Q8H   linezolid  600 mg Oral Q12H   loratadine  10 mg Oral Daily   midodrine  10 mg Oral TID WC   pantoprazole  40 mg Oral Daily   sodium chloride flush  10-40 mL Intracatheter Q12H   tamsulosin  0.4 mg Oral Daily   Continuous Infusions:   LOS: 7 days   Burnadette Pop, MD [facility] P6/19/2024, 9:00 AM

## 2022-12-06 NOTE — Progress Notes (Signed)
RT at bedside to find pt on [place] with BiPAP machine on stby at bedside. Pt tolerating well at this time. 

## 2022-12-06 NOTE — Progress Notes (Signed)
BiPAP on S/B for any indication of resp distress. VSS stable at this time on [place].

## 2022-12-07 DIAGNOSIS — J90 Pleural effusion, not elsewhere classified: Secondary | ICD-10-CM | POA: Diagnosis not present

## 2022-12-07 LAB — BASIC METABOLIC PANEL
Anion gap: 9 (ref 5–15)
BUN: 39 mg/dL — ABNORMAL HIGH (ref 8–23)
CO2: 30 mmol/L (ref 22–32)
Calcium: 8.4 mg/dL — ABNORMAL LOW (ref 8.9–10.3)
Chloride: 100 mmol/L (ref 98–111)
Creatinine, Ser: 1.71 mg/dL — ABNORMAL HIGH (ref 0.61–1.24)
GFR, Estimated: 39 mL/min — ABNORMAL LOW (ref >60)
Glucose, Bld: 119 mg/dL — ABNORMAL HIGH (ref 70–99)
Potassium: 3.8 mmol/L (ref 3.5–5.1)
Sodium: 139 mmol/L (ref 135–145)

## 2022-12-07 LAB — CBC
HCT: 28.1 % — ABNORMAL LOW (ref 39.0–52.0)
Hemoglobin: 8.2 g/dL — ABNORMAL LOW (ref 13.0–17.0)
MCH: 28.9 pg (ref 26.0–34.0)
MCHC: 29.2 g/dL — ABNORMAL LOW (ref 30.0–36.0)
MCV: 98.9 fL (ref 80.0–100.0)
Platelets: 166 10*3/uL (ref 150–400)
RBC: 2.84 MIL/uL — ABNORMAL LOW (ref 4.22–5.81)
RDW: 18.6 % — ABNORMAL HIGH (ref 11.5–15.5)
WBC: 9.9 10*3/uL (ref 4.0–10.5)
nRBC: 0.3 % — ABNORMAL HIGH (ref 0.0–0.2)

## 2022-12-07 LAB — CULTURE, BODY FLUID W GRAM STAIN -BOTTLE: Culture: NO GROWTH

## 2022-12-07 MED ORDER — SENNA 8.6 MG PO TABS
1.0000 | ORAL_TABLET | Freq: Two times a day (BID) | ORAL | Status: DC
  Administered 2022-12-07 – 2022-12-08 (×3): 8.6 mg via ORAL
  Filled 2022-12-07 (×3): qty 1

## 2022-12-07 MED ORDER — POLYETHYLENE GLYCOL 3350 17 G PO PACK
17.0000 g | PACK | Freq: Every day | ORAL | Status: DC
  Administered 2022-12-07 – 2022-12-08 (×2): 17 g via ORAL
  Filled 2022-12-07 (×2): qty 1

## 2022-12-07 NOTE — Progress Notes (Signed)
Per Grandaughter/Pt request   12/07/22 0045  BiPAP/CPAP/SIPAP  $ Non-Invasive Ventilator  Non-Invasive Vent Subsequent  BiPAP/CPAP/SIPAP Pt Type Adult  BiPAP/CPAP/SIPAP V60  Mask Type Full face mask  Mask Size Medium  Set Rate 12 breaths/min  Respiratory Rate 18 breaths/min  IPAP 14 cmH20  EPAP 6 cmH2O  FiO2 (%) 35 %  Minute Ventilation 7.9  Leak 5  Tidal Volume (Vt) 480 (560)  Press High Alarm 25 cmH2O  BiPAP/CPAP /SiPAP Vitals  Pulse Rate 87  Resp 18  SpO2 99 %

## 2022-12-07 NOTE — Plan of Care (Signed)

## 2022-12-07 NOTE — TOC Initial Note (Signed)
Transition of Care [facility]) - Initial/Assessment Note    Patient Details  Name: [name] [name] MRN: [id number] Date of Birth: 07-31-1940  Transition [facility] CM/SW Contact:    Delilah Shan, LCSWA Phone Number: 12/07/2022, 11:05 AM  Clinical Narrative:                  CSW received consult for possible SNF placement at time of discharge. Due to patients current orientation CSW spoke with patients spouse regarding PT recommendation of SNF placement at time of discharge.  Patients spouse expressed understanding of PT recommendation and is agreeable to SNF placement for patient time of discharge. Patients spouse number one choice is Librarian, academic. CSW discussed insurance authorization process and will provide Medicare SNF ratings list with accepted SNF bed offers when available.  No further questions reported at this time. CSW to continue to follow and assist with discharge planning needs.   Expected Discharge Plan: Skilled Nursing Facility Barriers to Discharge: Continued Medical Work up   Patient Goals and CMS Choice Patient states their goals for this hospitalization and ongoing recovery are:: SNF   Choice offered to / list presented to : Spouse      Expected Discharge Plan and Services In-house Referral: Clinical Social Work Discharge Planning Services: CM Consult   Living arrangements for the past 2 months: Single Family Home                                      Prior Living Arrangements/Services Living arrangements for the past 2 months: Single Family Home Lives with:: Spouse Patient language and need for interpreter reviewed:: Yes Do you feel safe going back to the place where you live?: No   SNF  Need for Family Participation in Patient Care: Yes (Comment) Care giver support system in place?: Yes (comment)   Criminal Activity/Legal Involvement Pertinent to Current Situation/Hospitalization: No - Comment as needed  Activities of Daily Living Home  Assistive Devices/Equipment: None ADL Screening (condition at time of admission) Patient's cognitive ability adequate to safely complete daily activities?: No Is the patient deaf or have difficulty hearing?: Yes Does the patient have difficulty seeing, even when wearing glasses/contacts?: No Does the patient have difficulty concentrating, remembering, or making decisions?: Yes Patient able to express need for assistance with ADLs?: Yes Does the patient have difficulty dressing or bathing?: Yes Independently performs ADLs?: No Communication: Independent Dressing (OT): Needs assistance Is this a change from baseline?: Pre-admission baseline Grooming: Needs assistance Is this a change from baseline?: Pre-admission baseline Feeding: Independent Bathing: Needs assistance Is this a change from baseline?: Pre-admission baseline Toileting: Needs assistance Is this a change from baseline?: Pre-admission baseline In/Out Bed: Needs assistance Is this a change from baseline?: Pre-admission baseline Walks in Home: Needs assistance Is this a change from baseline?: Pre-admission baseline Does the patient have difficulty walking or climbing stairs?: Yes Weakness of Legs: Both Weakness of Arms/Hands: Both  Permission Sought/Granted Permission sought to share information with : Case Manager, Family Supports, Oceanographer granted to share information with : No  Share Information with NAME: Due to current orientation CSW spoke with patients spouse  Permission granted to share info w AGENCY: SNF  Permission granted to share info w Relationship: spouse  Permission granted to share info w Contact Information: [name] [phone]  Emotional Assessment Appearance:: Appears stated age Attitude/Demeanor/Rapport: Gracious Affect (typically observed): Calm Orientation: : Oriented  to Self, Oriented to Place Alcohol / Substance Use: Not Applicable Psych Involvement: No  (comment)  Admission diagnosis:  Loculated pleural effusion [J90] MRSA bacteremia [R78.81, B95.62] Patient Active Problem List   Diagnosis Date Noted   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) 11/30/2022   Acute metabolic encephalopathy 11/30/2022   AKI (acute kidney injury) (HCC) 11/30/2022   Loculated pleural effusion 11/28/2022   Chronic wound 11/28/2022   MRSA bacteremia 11/28/2022   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 12/18/2019 12/17/2019   Incarcerated left inguinal hernia s/p repair 12/02/2019 12/01/2019   Pressure injury of skin 10/06/2019   Chronic diastolic heart failure (HCC) 01/28/2015   Cavitary pneumonia 10/11/2013   Acute respiratory failure with hypoxia (HCC) 10/07/2013   Septic shock (HCC) 10/07/2013   Recurrent UTI 10/06/2013   Insomnia 09/04/2013   Bradycardia 08/23/2013   Atrial fibrillation (HCC) 05/21/2013   Atrial flutter (HCC)    Chronic systolic CHF (congestive heart failure) (HCC) 05/11/2013   Aortic atherosclerosis (HCC) 05/05/2013   Normocytic anemia 05/04/2013   CAD (coronary artery disease)    S/P AVR    Hyperlipidemia    HTN (hypertension)    PCP:  Street, [name], MD Pharmacy:   [facility] [address] [place] - [address] [facility] [place] [address] Phone: [phone] Fax: [phone]     Social Determinants of Health (SDOH) Social History: SDOH Screenings   Food Insecurity: Patient Unable To Answer (11/28/2022)  Housing: Patient Unable To Answer (11/28/2022)  Transportation Needs: Patient Unable To Answer (11/28/2022)  Utilities: Not At Risk (11/28/2022)  Depression (PHQ2-9): Low Risk  (04/13/2019)  Tobacco Use: Medium Risk (11/30/2022)   SDOH Interventions:     Readmission Risk Interventions     No data to display

## 2022-12-07 NOTE — Progress Notes (Signed)
PROGRESS NOTE  [name] [name]  ZOX:[id number] DOB: 1941-04-23 DOA: 11/28/2022 PCP: Casper Harrison, [name], MD   Brief Narrative: Patient is 82 year old male with history of chronic systolic CHF, bioprosthetic aortic valve replacement, aortic aneurysm, peripheral vascular disease, chronic A-fib on Eliquis, chronic wounds on bilateral lower extremities, anemia who was transferred from [facility] for evaluation of hypoxia.  He was initially seen at wound care clinic and was noted to be hypoxic and was sent to the emergency department.  He was initially diagnosed at the [facility] to have MRSA pneumonia and septicemia  and was discharged home on IV abx and oral doxycycline.  On repeat presentation to the emergency department at [facility], CT scan showed large loculated left-sided pleural effusion, cavitary lesion in the right upper lobe.  Patient was transferred to [facility]. ID was consulted here.  Prolonged hospital course.  Cardiology was also following.  Now clinically improving.  PT/OT consulted, recommended skilled nursing facility on discharge.  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  No fluid analysis was available.  Repeat CT showed moderate right-sided layering effusion, multifocal necrotizing pneumonia.  Initially on  septic shock and acute renal failure.  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  Patient was suspected that he cannot tolerate TEE, ID recommended prolonged suppression with antibiotics.  Currently on Zyvox.  Recommended 4 weeks from 6/12 followed by doxycycline suppression.  Patient will follow-up with ID as an outpatient. Underwent repeat thoracentesis on 6/16.  Cultures have not shown any growth, cytology showed mesothelial cells.  He remains comfortable on 2-3 L of oxygen  Acute respiratory failure with hypoxia: Secondary to pleural effusion, cavitary pneumonia.  Continue supplemental oxygen as needed. Needed BiPAP intermittently.He was not on oxygen at home.  He probably needs oxygen on discharge.  Will recommend to continue BiPAP at night at least during the hospital stay  Permanent A-fib: Currently heart rate is well-controlled.  On amiodarone.  Will resume Eliquis when possible.  Toprol on hold due to hypotension  Coronary artery disease: Continue aspirin, plan for starting on statin on discharge.No chest pain currently  Chronic combined systolic/diastolic CHF: EF of 35 to 40%.  Not volume overloaded.  Currently requiring midodrine to sustain blood pressure.  Cardiology following.  Lasix on hold due to hypotension.  History of thoracic aortic dissection/aortic insufficiency: Status post aortic valve replacement.  TTE on this admission showed normal functioning aortic valve.  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  Patient is not a candidate for repair  AKI on  CKD stage IIIb: Creatinine continues to improve and is currently close to baseline.  Baseline creatinine of 1.5  Normocytic anemia: Currently no evidence of acute blood loss.  Continue to monitor hemoglobin.  Elevated liver enzymes: Currently stable, suspected to be from congestive hepatic  liver disease  Goals of care: Elderly male with multiple problems.  Currently DNR.   Palliative care was following.  Palliative care met with the patient, no escalation of treatment.  Pressure Injury 11/29/22 Heel Right Stage 3 -  Full thickness tissue loss. Subcutaneous fat may be visible but bone, tendon or muscle are NOT exposed. (Active)  11/29/22 1046  Location: Heel  Location Orientation: Right  Staging: Stage 3 -  Full thickness tissue loss. Subcutaneous fat may be visible but bone, tendon or muscle are NOT exposed.  Wound Description (Comments):   Present on Admission: Yes     Pressure Injury 12/01/22 Buttocks Bilateral Deep Tissue Pressure Injury - Purple or maroon localized area of discolored intact skin or blood-filled blister due to damage of underlying soft tissue from pressure and/or shear. (Active)  12/01/22 1745  Location: Buttocks  Location Orientation: Bilateral  Staging: Deep Tissue Pressure Injury - Purple or maroon localized area of discolored intact skin or blood-filled blister due to damage of underlying soft tissue from pressure and/or shear.  Wound Description (Comments):   Present on Admission:      Pressure Injury 12/04/22 Coccyx Stage 2 -  Partial thickness loss of dermis presenting as a shallow open injury with a red, pink wound bed without slough. (Active)  12/04/22 2010  Location: Coccyx  Location Orientation:   Staging: Stage 2 -  Partial thickness loss of dermis presenting as a shallow open injury with a red, pink wound bed without slough.  Wound Description (Comments):   Present on Admission: Yes     Pressure Injury 12/04/22 Buttocks Bilateral Stage 2 -  Partial thickness loss of dermis presenting as a shallow open injury with a red, pink wound bed without slough. (Active)  12/04/22 2010  Location: Buttocks  Location Orientation: Bilateral  Staging: Stage 2 -  Partial thickness loss of dermis presenting as a shallow open injury with a red, pink wound bed without slough.  Wound Description (Comments):   Present on Admission: Yes           Nutrition Problem: Inadequate oral intake Etiology: inability to eat Pressure Injury 11/29/22 Heel Right Stage 3 -  Full thickness tissue loss. Subcutaneous fat may be visible but bone, tendon or muscle are NOT exposed. (Active)  11/29/22 1046  Location: Heel  Location Orientation: Right  Staging: Stage 3 -  Full thickness tissue loss. Subcutaneous fat may be visible but bone, tendon or muscle are NOT exposed.  Wound Description (Comments):   Present on Admission: Yes  Dressing Type Foam - Lift dressing to assess site every shift 12/07/22 0825     Pressure Injury 12/01/22 Buttocks Bilateral Deep Tissue Pressure Injury - Purple or maroon localized area of discolored intact skin or blood-filled blister due to damage of underlying soft tissue from pressure and/or shear. (Active)  12/01/22 1745  Location: Buttocks  Location Orientation: Bilateral  Staging: Deep Tissue Pressure Injury - Purple or maroon localized area of discolored intact skin or blood-filled blister due to damage of underlying soft tissue from pressure and/or shear.  Wound Description (Comments):   Present on Admission:   Dressing Type Foam - Lift dressing to assess site every shift 12/07/22 0825     Pressure Injury 12/04/22 Coccyx Stage 2 -  Partial thickness loss of dermis presenting as a shallow open injury with a red, pink wound bed without slough. (Active)  12/04/22 2010  Location: Coccyx  Location Orientation:   Staging: Stage 2 -  Partial thickness  loss of dermis presenting as a shallow open injury with a red, pink wound bed without slough.  Wound Description (Comments):   Present on Admission: Yes  Dressing Type Foam - Lift dressing to assess site every shift 12/07/22 0825     Pressure Injury 12/04/22 Buttocks Bilateral Stage 2 -  Partial thickness loss of dermis presenting as a shallow open injury with a red, pink wound bed without slough. (Active)  12/04/22 2010  Location: Buttocks  Location  Orientation: Bilateral  Staging: Stage 2 -  Partial thickness loss of dermis presenting as a shallow open injury with a red, pink wound bed without slough.  Wound Description (Comments):   Present on Admission: Yes  Dressing Type Foam - Lift dressing to assess site every shift 12/07/22 0825    DVT prophylaxis:heparin injection 5,000 Units Start: 11/28/22 2200 SCDs Start: 11/28/22 2057     Code Status: DNR  Family Communication: Discussed with wife at bedside on 6/20  Patient status:Inpatient  Patient is from :Home  Anticipated discharge YN:WGNF vs SNF  Estimated DC date: 1 to 2 days   Consultants: ID, cardiology, palliative care  Procedures: Thoracentesis  Antimicrobials:  Anti-infectives (From admission, onward)    Start     Dose/Rate Route Frequency Ordered Stop   12/05/22 2200  linezolid (ZYVOX) tablet 600 mg        600 mg Oral Every 12 hours 12/05/22 1517 12/26/22 2359   11/28/22 2200  linezolid (ZYVOX) IVPB 600 mg  Status:  Discontinued        600 mg 300 mL/hr over 60 Minutes Intravenous Every 12 hours 11/28/22 2050 12/05/22 1517       Subjective: Patient seen and examined at bedside today.  Overall looks comfortable but remains weak and deconditioned.  Lying in bed.  Wife at bedside.  Denies any worsening shortness of breath or cough.  On 2-3 L of oxygen per minute.  He is sitting in the chair yesterday afternoon.  I requested him to try to ambulate and sit in the chair today  Objective: Vitals:   12/07/22 0442 12/07/22 0500 12/07/22 0546 12/07/22 0825  BP: (!) 101/52   126/63  Pulse: 83  89   Resp: 19  (!) 28   Temp: 97.6 F (36.4 C)     TempSrc: Axillary     SpO2: 100%  96% 95%  Weight:  84.4 kg      Intake/Output Summary (Last 24 hours) at 12/07/2022 1134 Last data filed at 12/07/2022 0455 Gross per 24 hour  Intake --  Output 400 ml  Net -400 ml   Filed Weights   12/05/22 0442 12/06/22 0500 12/07/22 0500  Weight: 83 kg 84.7 kg 84.4 kg     Examination:   General exam: Overall comfortable, not in distress,weak,chronically ill looking HEENT: PERRL Respiratory system: Diminished air sounds bilaterally, no wheezes or crackles  Cardiovascular system: Irregular regular rhythm, heart rate well-controlled Gastrointestinal system: Abdomen is distended, soft and nontender.BS present Central nervous system: Alert and oriented Extremities: No edema, no clubbing ,no cyanosis Skin: No rashes,no icterus  ,sacral ulcer,ulcers on feet   Data Reviewed: I have personally reviewed following labs and imaging studies  CBC: Recent Labs  Lab 12/03/22 0237 12/04/22 0315 12/05/22 0500 12/06/22 0539 12/07/22 0500  WBC 6.5 7.9 10.6* 11.1* 9.9  HGB 8.3* 8.6* 8.3* 8.4* 8.2*  HCT 27.7* 29.2* 27.4* 28.6* 28.1*  MCV 96.9 97.7 96.5 98.6 98.9  PLT 262 249 211 179 166   Basic  Metabolic Panel: Recent Labs  Lab 12/01/22 0450 12/02/22 0500 12/03/22 0237 12/04/22 0315 12/05/22 0500 12/06/22 0539 12/07/22 0500  NA 135   < > 134* 135 137 138 139  K 3.4*   < > 3.8 3.8 3.6 3.7 3.8  CL 96*   < > 95* 97* 97* 99 100  CO2 24   < > 26 23 29 30 30   GLUCOSE 93   < > 114* 106* 126* 118* 119*  BUN 44*   < > 42* 37* 34* 35* 39*  CREATININE 3.11*   < > 2.61* 2.17* 2.01* 1.73* 1.71*  CALCIUM 7.6*   < > 8.0* 8.6* 8.3* 8.5* 8.4*  MG 2.3  --   --   --   --   --   --    < > = values in this interval not displayed.     Recent Results (from the past 240 hour(s))  MRSA Next Gen by PCR, Nasal     Status: Abnormal   Collection Time: 11/29/22  5:06 AM   Specimen: Nasal Mucosa; Nasal Swab  Result Value Ref Range Status   MRSA by PCR Next Gen DETECTED (A) NOT DETECTED Final    Comment: RESULT CALLED TO, READ BACK BY AND VERIFIED WITH: B HYDLEBURG,RN@0700  11/29/22 MK (NOTE) The GeneXpert MRSA Assay (FDA approved for NASAL specimens only), is one component of a comprehensive MRSA colonization surveillance program. It is not intended to diagnose MRSA  infection nor to guide or monitor treatment for MRSA infections. Test performance is not FDA approved in patients less than 57 years old. Performed at [facility], [address] [place], [place] [address]   Culture, blood (x 2)     Status: None   Collection Time: 11/29/22  9:06 AM   Specimen: BLOOD RIGHT ARM  Result Value Ref Range Status   Specimen Description BLOOD RIGHT ARM  Final   Special Requests   Final    BOTTLES DRAWN AEROBIC ONLY Blood Culture adequate volume   Culture   Final    NO GROWTH 5 DAYS Performed at [facility], [address], [place], [place] [address]    Report Status 12/04/2022 FINAL  Final  Culture, blood (Routine X 2) w Reflex to ID Panel     Status: None   Collection Time: 11/29/22  9:17 AM   Specimen: BLOOD RIGHT ARM  Result Value Ref Range Status   Specimen Description BLOOD RIGHT ARM  Final   Special Requests BOTTLES DRAWN AEROBIC ONLY  Final   Culture   Final    NO GROWTH 5 DAYS Performed at [facility], [address] [place], [place] [address]    Report Status 12/04/2022 FINAL  Final  Urine Culture     Status: None   Collection Time: 11/29/22  6:00 PM   Specimen: Urine, Clean Catch  Result Value Ref Range Status   Specimen Description URINE, CLEAN CATCH  Final   Special Requests NONE Reflexed from [id number]  Final   Culture   Final    NO GROWTH Performed at [facility], [address] [place], [place] [address]    Report Status 11/30/2022 FINAL  Final  Culture, body fluid w Gram Stain-bottle     Status: None (Preliminary result)   Collection Time: 12/03/22  9:36 AM   Specimen: Fluid  Result Value Ref Range Status   Specimen Description FLUID PLEURAL  Final   Special Requests  Final    BOTTLES DRAWN AEROBIC AND ANAEROBIC Blood Culture adequate volume   Culture   Final    NO GROWTH 4 DAYS Performed at [facility], [address], [place], [place] [address]    Report Status PENDING  Incomplete  Gram  stain     Status: None   Collection Time: 12/03/22  9:36 AM   Specimen: Fluid  Result Value Ref Range Status   Specimen Description FLUID PLEURAL  Final   Special Requests SYRINGE  Final   Gram Stain   Final    RARE WBC SEEN NO ORGANISMS SEEN Performed at [facility], [address] [place], [place] [address]    Report Status 12/03/2022 FINAL  Final     Radiology Studies: No results found.  Scheduled Meds:  amiodarone  200 mg Oral Daily   aspirin  81 mg Oral Daily   Chlorhexidine Gluconate Cloth  6 each Topical Q0600   feeding supplement  237 mL Oral TID BM   heparin  5,000 Units Subcutaneous Q8H   linezolid  600 mg Oral Q12H   loratadine  10 mg Oral Daily   midodrine  10 mg Oral TID WC   pantoprazole  40 mg Oral Daily   sodium chloride flush  10-40 mL Intracatheter Q12H   tamsulosin  0.4 mg Oral Daily   Continuous Infusions:   LOS: 8 days   Burnadette Pop, MD [facility] P6/20/2024, 11:34 AM

## 2022-12-07 NOTE — Progress Notes (Signed)
   12/07/22 2100  BiPAP/CPAP/SIPAP  BiPAP/CPAP/SIPAP Pt Type Adult  Reason BIPAP/CPAP not in use Non-compliant

## 2022-12-07 NOTE — Progress Notes (Signed)
   12/07/22 2100  BiPAP/CPAP/SIPAP  BiPAP/CPAP/SIPAP Pt Type Adult  BiPAP/CPAP/SIPAP V60 (Placed on bipap per grandaughter)  Mask Type Full face mask  Mask Size Medium  Set Rate 16 breaths/min  Respiratory Rate 18 breaths/min  IPAP 14 cmH20  EPAP 6 cmH2O  FiO2 (%) 35 %  Minute Ventilation 8.6  Leak 0  Peak Inspiratory Pressure (PIP) 14  Tidal Volume (Vt) 481  Patient Home Equipment No  Press High Alarm 25 cmH2O  Press Low Alarm 5 cmH2O

## 2022-12-07 NOTE — Progress Notes (Signed)
Per Greandaughter/pt; pt returned to [facility]  12/07/22 0546  Oxygen Therapy/Pulse Ox  O2 Device Nasal Cannula  O2 Therapy Oxygen humidified  O2 Flow Rate (L/min) 2.5 L/min  SpO2 96 %

## 2022-12-07 NOTE — Progress Notes (Signed)
  Inpatient Rehab Admissions Coordinator :  Per OT therapy recommendations, patient was screened for CIR candidacy by Tyner Codner RN MSN.  At this time patient appears to be a potential candidate for CIR. I will place a rehab consult per protocol for full assessment. Please call me with any questions.  Emmarae Cowdery RN MSN Admissions Coordinator [phone]   

## 2022-12-07 NOTE — Progress Notes (Signed)
Patient ID: [name] [name], male   DOB: [birth year], 82 y.o.   MRN: [id number]    Progress Note from the Palliative Medicine Team at [facility]   Patient Name: [name] [name]        Date: 12/07/2022 DOB: 11-Aug-1940  Age: 82 y.o. MRN#: [id number] Attending Physician: Burnadette Pop, MD Primary Care Physician: Street, [name], MD Admit Date: 11/28/2022    Extensive chart review has been completed prior to meeting with patient/family  including labs, vital signs, imaging, progress/consult notes, orders, medications and available advance directive documents.    This NP assessed patient at the bedside as a follow up for palliative medicine needs and emotional support.  82 year old male prior history of chronic systolic heart failure EF of 40 to 45%, history of bioprosthetic aortic valve replacement, history of aortic aneurysm, peripheral vascular disease, chronic A-fib on Eliquis, chronic wounds on his bilateral lower extremities, history of anemia, presents to the ER at [facility] yesterday for hypoxia.  He had been seen in wound care clinic and was noted to have a room air O2 saturations of 82%.  He was sent to the ER.   Last month, he was admitted to [facility] where he was diagnosed with MRSA pneumonia and septicemia.  Unclear if he had a transesophageal echocardiogram done.  He was discharged home on IV Cubicin for his septicemia and p.o. doxycycline for his MRSA pneumonia.  Reportedly he had completed his doxycycline for his MRSA pneumonia and had been continued on IV Cubicin.   in the [facility] ER, he had a CT scan which demonstrated a large loculated left-sided pleural effusion. He also had a small right pleural effusion. He also had a cavitary lesion in his right upper lobe that was consistent with his history of MRSA pneumonia. He underwent thoracentesis yielding 900cc of fluid   Pt was transferred to [facility] for formal ID evaluation  Patient and family face  treatment option decisions, advanced directive decisions and anticipatory care needs.    Patient is more alert today and appears comfortable.  Wife at bedside  Ongoing education regarding patient's multiple co-morbidities, patient and family understand the seriousness of his medical situation and is high risk for decompensation.  Patient and family are hopeful for improvement.  Patient is open to short-term rehab when medically stable.  [name] [name] and his wife are hopeful for the possible opportunity for ongoing rehabilitation in CIR.     Education offered today regarding  the importance of continued conversation within family and the  medical providers regarding overall plan of care and treatment options,  ensuring decisions are within the context of the patients values and GOCs.  Questions and concerns addressed   PMT will continue to support holistically  Time:  35 minutes  Detailed review of medical records ( labs, imaging, vital signs), medically appropriate exam ( MS, skin, resp)   discussed with treatment team, counseling and education to patient, family, staff, documenting clinical information, medication management, coordination of care    Lorinda Creed NP  Palliative Medicine Team Team Phone # [phone] Pager [phone]

## 2022-12-07 NOTE — Evaluation (Addendum)
Occupational Therapy Evaluation Patient Details Name: [name] [name] MRN: [id number] DOB: 18-Feb-1941 Today's Date: 12/07/2022   History of Present Illness Pt is an 82 year old male admitted on 11/28/22 for hypoxia.  Prior history of chronic systolic heart failure EF of 40 to 45%, history of bioprosthetic aortic valve replacement, history of aortic aneurysm, peripheral vascular disease, chronic A-fib on Eliquis, chronic wounds on his bilateral lower extremities, history of anemia.   Clinical Impression   Pt typically walks with a cane and is independent in self care. He lives with his supportive wife. Pt presents with generalized weakness and impaired standing balance. He requires mod assist for bed mobility and min assist to step-pivot to chair with RW. Pt needs set up to total assist for ADLs. VSS on 2.5L O2 throughout session. Difficult to assess cognition, pt with minimal verbalization, but following commands well. Pt will benefit from post acute rehab >3 hours a day prior to return home.      Recommendations for follow up therapy are one component of a multi-disciplinary discharge planning process, led by the attending physician.  Recommendations may be updated based on patient status, additional functional criteria and insurance authorization.   Assistance Recommended at Discharge Frequent or constant Supervision/Assistance  Patient can return home with the following A little help with walking and/or transfers;A lot of help with bathing/dressing/bathroom;Assistance with cooking/housework;Assist for transportation;Help with stairs or ramp for entrance    Functional Status Assessment  Patient has had a recent decline in their functional status and demonstrates the ability to make significant improvements in function in a reasonable and predictable amount of time.  Equipment Recommendations  None recommended by OT    Recommendations for Other Services       Precautions / Restrictions  Precautions Precautions: Fall;Other (comment) Precaution Comments: Contact Restrictions Weight Bearing Restrictions: No      Mobility Bed Mobility Overal bed mobility: Needs Assistance Bed Mobility: Supine to Sit     Supine to sit: Mod assist     General bed mobility comments: assist for R LE over EOB and to raise trunk, HOB up    Transfers Overall transfer level: Needs assistance Equipment used: Rolling walker (2 wheels) Transfers: Sit to/from Stand, Bed to chair/wheelchair/BSC Sit to Stand: Min assist     Step pivot transfers: Min assist     General transfer comment: assist to rise and steady      Balance Overall balance assessment: Needs assistance Sitting-balance support: Feet supported, Bilateral upper extremity supported Sitting balance-Leahy Scale: Fair     Standing balance support: Bilateral upper extremity supported, During functional activity, Reliant on assistive device for balance Standing balance-Leahy Scale: Poor Standing balance comment: Reliant on RW                           ADL either performed or assessed with clinical judgement   ADL Overall ADL's : Needs assistance/impaired Eating/Feeding: Set up;Sitting   Grooming: Set up;Sitting   Upper Body Bathing: Minimal assistance;Sitting   Lower Body Bathing: Maximal assistance;Sit to/from stand   Upper Body Dressing : Minimal assistance;Sitting   Lower Body Dressing: Total assistance;Bed level   Toilet Transfer: Minimal assistance;Stand-pivot;Rolling walker (2 wheels)   Toileting- Clothing Manipulation and Hygiene: Maximal assistance;Sit to/from stand               Vision Baseline Vision/History: 1 Wears glasses Ability to See in Adequate Light: 0 Adequate Patient Visual Report: No change from  baseline       Perception     Praxis      Pertinent Vitals/Pain Pain Assessment Pain Assessment: Faces Faces Pain Scale: Hurts a little bit Pain Location: L foot Pain  Descriptors / Indicators: Guarding, Grimacing Pain Intervention(s): Monitored during session, Repositioned     Hand Dominance Right   Extremity/Trunk Assessment Upper Extremity Assessment Upper Extremity Assessment: Generalized weakness   Lower Extremity Assessment Lower Extremity Assessment: Defer to PT evaluation       Communication Communication Communication: HOH   Cognition Arousal/Alertness: Awake/alert Behavior During Therapy: Flat affect Overall Cognitive Status: Difficult to assess                                 General Comments: pt minimally verbal     General Comments       Exercises     Shoulder Instructions      Home Living Family/patient expects to be discharged to:: Private residence Living Arrangements: Spouse/significant other Available Help at Discharge: Family;Available 24 hours/day Type of Home: Mobile home Home Access: Stairs to enter Entrance Stairs-Number of Steps: 4 Entrance Stairs-Rails: Right Home Layout: One level     Bathroom Shower/Tub: Producer, television/film/video: Handicapped height Bathroom Accessibility: Yes How Accessible: Accessible via walker Home Equipment: Rolling Walker (2 wheels);Shower seat;Cane - single point;Rollator (4 wheels);Grab bars - tub/shower;Toilet riser;Hand held shower head;Other (comment) (lift chair)          Prior Functioning/Environment Prior Level of Function : Independent/Modified Independent;History of Falls (last six months)             Mobility Comments: Pt used SPC during day and RW at night. ADLs Comments: independent in self care, assisted for IADLs        OT Problem List: Decreased strength;Decreased activity tolerance;Impaired balance (sitting and/or standing);Decreased knowledge of use of DME or AE      OT Treatment/Interventions: Self-care/ADL training;DME and/or AE instruction;Therapeutic activities;Patient/family education;Balance training    OT  Goals(Current goals can be found in the care plan section) Acute Rehab OT Goals OT Goal Formulation: With patient/family Time For Goal Achievement: 12/21/22 Potential to Achieve Goals: Good ADL Goals Pt Will Perform Grooming: standing;with min guard assist Pt Will Perform Upper Body Dressing: with set-up;sitting Pt Will Perform Lower Body Dressing: with min assist;sit to/from stand Pt Will Transfer to Toilet: with min guard assist;ambulating;bedside commode Pt Will Perform Toileting - Clothing Manipulation and hygiene: with min assist;sit to/from stand Additional ADL Goal #1: Pt will complete bed mobility with min guard assist in preparation for ADLs.  OT Frequency: Min 1X/week    Co-evaluation              AM-PAC OT "6 Clicks" Daily Activity     Outcome Measure Help from another person eating meals?: A Little Help from another person taking care of personal grooming?: A Little Help from another person toileting, which includes using toliet, bedpan, or urinal?: Total Help from another person bathing (including washing, rinsing, drying)?: A Lot Help from another person to put on and taking off regular upper body clothing?: A Little Help from another person to put on and taking off regular lower body clothing?: Total 6 Click Score: 13   End of Session Equipment Utilized During Treatment: Rolling walker (2 wheels);Oxygen;Gait belt Nurse Communication: Mobility status  Activity Tolerance: Patient tolerated treatment well Patient left: in chair;with call bell/phone within reach;with family/visitor present  OT Visit Diagnosis: Unsteadiness on feet (R26.81);Other abnormalities of gait and mobility (R26.89);Muscle weakness (generalized) (M62.81)                Time: 1410-1440 OT Time Calculation (min): 30 min Charges:  OT General Charges $OT Visit: 1 Visit OT Evaluation $OT Eval Moderate Complexity: 1 Mod OT Treatments $Self Care/Home Management : 8-22 mins  Berna Spare,  OTR/L Acute Rehabilitation Services Office: [phone]   Evern Bio 12/07/2022, 3:15 PM

## 2022-12-07 NOTE — Progress Notes (Signed)
RT at bedside to find pt on [place] with BiPAP machine on stby at bedside. Pt tolerating well at this time.

## 2022-12-07 NOTE — Progress Notes (Signed)
Per pt/Grandaughter; pt returned to BiPAP   12/07/22 0255  BiPAP/CPAP/SIPAP  Set Rate (S)  16 breaths/min  Respiratory Rate 16 breaths/min (-18)  IPAP 14 cmH20  EPAP 6 cmH2O  FiO2 (%) 36 %  Minute Ventilation 6  Leak 4  Tidal Volume (Vt) 400  Press High Alarm 25 cmH2O  BiPAP/CPAP /SiPAP Vitals  Pulse Rate 97  Resp 16  SpO2 100 %

## 2022-12-07 NOTE — [place] FL2 (Signed)
[place] MEDICAID FL2 LEVEL OF CARE FORM     IDENTIFICATION  Patient Name: [name] [name]: 1940/08/05 Sex: male Admission Date (Current Location): 11/28/2022  [facility] and [place] Number:  Producer, television/film/video and Address:  [facility], 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) 11/30/2022   Acute metabolic encephalopathy 11/30/2022   AKI (acute kidney injury) (HCC) 11/30/2022   Loculated pleural effusion 11/28/2022   Chronic wound 11/28/2022   MRSA bacteremia 11/28/2022   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 12/18/2019 12/17/2019   Incarcerated left inguinal hernia s/p repair 12/02/2019 12/01/2019   Pressure injury of skin 10/06/2019   Chronic diastolic heart failure (HCC) 01/28/2015   Cavitary pneumonia 10/11/2013   Acute respiratory failure with hypoxia (HCC) 10/07/2013   Septic shock (HCC) 10/07/2013   Recurrent UTI 10/06/2013   Insomnia 09/04/2013   Bradycardia 08/23/2013   Atrial fibrillation (HCC) 05/21/2013   Atrial flutter (HCC)    Chronic systolic CHF (congestive heart failure) (HCC) 05/11/2013   Aortic atherosclerosis (HCC) 05/05/2013   Normocytic anemia 05/04/2013   CAD (coronary artery disease)    S/P AVR    Hyperlipidemia    HTN (hypertension)     Orientation RESPIRATION BLADDER Height & Weight     Self, Place  O2 (Nasal Cannula 2.5 liters) Incontinent  Weight: 186 lb (84.4 kg) Height:     BEHAVIORAL SYMPTOMS/MOOD NEUROLOGICAL BOWEL NUTRITION STATUS      Incontinent Diet (Please see discharge summary)  AMBULATORY STATUS COMMUNICATION OF NEEDS Skin   Extensive Assist Verbally Other (Comment) (dry.ecchymosis,abdomen,Bil.,Lower,Erythema,Buttocks,Bil.,Wound/Incision LDAs,PI heel,R,stage 3,foam lift dressing,PI buttocks,Bil.,deep tissue injury,foam lift dressing in place,PI coccyx stage 2 ,foam lift dressing,PRN,Please see additional info)                       Personal Care Assistance Level of Assistance  Bathing, Dressing, Feeding Bathing Assistance: Maximum assistance Feeding assistance: Limited assistance Dressing Assistance: Maximum assistance     Functional Limitations Info  Sight, Hearing, Speech Sight Info: Impaired Hearing Info: Impaired Speech Info: Adequate    SPECIAL CARE FACTORS FREQUENCY  PT (By licensed PT), OT (By licensed OT)     PT Frequency: 5x min weekly OT Frequency: 5x min weekly            Contractures Contractures Info: Not present    Additional Factors Info  Code Status, Allergies, Isolation Precautions Code Status Info: DNR Allergies Info: Lortab (hydrocodone-acetaminophen),Morphine And Codeine,other-Staples from surgery caused infection     Isolation Precautions Info: MRSA  onset date 11/29/22     Current Medications (12/07/2022):  This is the current hospital active medication list Current Facility-Administered Medications  Medication Dose Route Frequency Provider Last Rate Last Admin   acetaminophen (TYLENOL) tablet 650 mg  650 mg Oral Q6H PRN Jerald Kief, MD   650 mg at 11/30/22 1726   Or   acetaminophen (TYLENOL) suppository  650 mg  650 mg Rectal Q6H PRN Jerald Kief, MD       albuterol (PROVENTIL) (2.5 MG/3ML) 0.083% nebulizer solution 2.5 mg  2.5 mg Nebulization Q2H PRN Jerald Kief, MD   2.5 mg at 12/02/22 2116   amiodarone (PACERONE) tablet 200 mg  200 mg Oral Daily  Little Ishikawa, MD   200 mg at 12/07/22 0827   aspirin chewable tablet 81 mg  81 mg Oral Daily Little Ishikawa, MD   81 mg at 12/07/22 0827   Chlorhexidine Gluconate Cloth 2 % PADS 6 each  6 each Topical Q0600 Jerald Kief, MD   6 each at 12/07/22 0524   feeding supplement (ENSURE ENLIVE / ENSURE PLUS) liquid 237 mL  237 mL Oral TID BM Jerald Kief, MD   237 mL at 12/07/22 0818   fentaNYL (SUBLIMAZE) injection 12.5 mcg  12.5 mcg Intravenous Q2H PRN Jerald Kief, MD   12.5 mcg at 12/07/22 0248   heparin injection 5,000 Units  5,000 Units Subcutaneous Q8H Jerald Kief, MD   5,000 Units at 12/07/22 0524   linezolid (ZYVOX) tablet 600 mg  600 mg Oral Q12H Paytes, Austin A, RPH   600 mg at 12/07/22 0826   loratadine (CLARITIN) tablet 10 mg  10 mg Oral Daily Jerald Kief, MD   10 mg at 12/07/22 0827   melatonin tablet 5 mg  5 mg Oral QHS PRN Dow Adolph N, DO   5 mg at 12/05/22 0148   midodrine (PROAMATINE) tablet 10 mg  10 mg Oral TID WC Pia Mau D, PA-C   10 mg at 12/07/22 0827   ondansetron (ZOFRAN) tablet 4 mg  4 mg Oral Q6H PRN Jerald Kief, MD       Or   ondansetron [facility]) injection 4 mg  4 mg Intravenous Q6H PRN Jerald Kief, MD       pantoprazole (PROTONIX) EC tablet 40 mg  40 mg Oral Daily Jerald Kief, MD   40 mg at 12/07/22 0827   polyethylene glycol (MIRALAX / GLYCOLAX) packet 17 g  17 g Oral Daily Burnadette Pop, MD       senna (SENOKOT) tablet 8.6 mg  1 tablet Oral BID Burnadette Pop, MD       sodium chloride flush (NS) 0.9 % injection 10-40 mL  10-40 mL Intracatheter Q12H Hall, Carole N, DO   10 mL at 12/06/22 2150   sodium chloride flush (NS) 0.9 % injection 10-40 mL  10-40 mL Intracatheter PRN Dow Adolph N, DO   10 mL at 12/03/22 1955   tamsulosin (FLOMAX) capsule 0.4 mg  0.4 mg Oral Daily Jerald Kief, MD   0.4 mg at 12/07/22 0827   traMADol (ULTRAM) tablet 50 mg  50 mg Oral Q6H PRN Jerald Kief, MD   50 mg at 11/29/22 2246      Discharge Medications: Please see discharge summary for a list of discharge medications.  Relevant Imaging Results:  Relevant Lab Results:   Additional Information SSN- [id number],WU buttocks,BIl.,stage 2,foam lift dressing,PRN,Wound/Incision open or dehisced venous stasis ulcer,pretibial,left,lateral,wound/Incision open or dehisced non-pressure wound toe,anterior,R,exposed bone,Wound/Incision open or dehisced non-pressure wound toe,left,faily,Wound/Incision open or dehisced venous stasis ulcer,pretibial,R,daily  Delilah Shan, LCSWA

## 2022-12-07 NOTE — Progress Notes (Signed)
Per Grandaughter/pt request, returned to [facility]   12/07/22 0230  BiPAP/CPAP/SIPAP  $ Non-Invasive Ventilator  Non-Invasive Vent Subsequent  BiPAP/CPAP /SiPAP Vitals  Pulse Rate 90  Resp (!) 28  SpO2 94 %

## 2022-12-08 ENCOUNTER — Encounter (HOSPITAL_COMMUNITY): Payer: Self-pay | Admitting: Physical Medicine and Rehabilitation

## 2022-12-08 ENCOUNTER — Inpatient Hospital Stay (HOSPITAL_COMMUNITY)
Admission: RE | Admit: 2022-12-08 | Discharge: 2022-12-23 | DRG: 945 | Disposition: A | Discharge status: Home / Self Care | Payer: Medicare Other | Source: Intra-hospital | Attending: Physical Medicine and Rehabilitation | Admitting: Physical Medicine and Rehabilitation

## 2022-12-08 DIAGNOSIS — A4902 Methicillin resistant Staphylococcus aureus infection, unspecified site: Secondary | ICD-10-CM | POA: Diagnosis not present

## 2022-12-08 DIAGNOSIS — Z1321 Encounter for screening for nutritional disorder: Secondary | ICD-10-CM | POA: Diagnosis not present

## 2022-12-08 DIAGNOSIS — I10 Essential (primary) hypertension: Secondary | ICD-10-CM | POA: Diagnosis present

## 2022-12-08 DIAGNOSIS — I959 Hypotension, unspecified: Secondary | ICD-10-CM | POA: Diagnosis present

## 2022-12-08 DIAGNOSIS — J15212 Pneumonia due to Methicillin resistant Staphylococcus aureus: Secondary | ICD-10-CM | POA: Diagnosis present

## 2022-12-08 DIAGNOSIS — R5381 Other malaise: Principal | ICD-10-CM

## 2022-12-08 DIAGNOSIS — J189 Pneumonia, unspecified organism: Secondary | ICD-10-CM | POA: Diagnosis present

## 2022-12-08 DIAGNOSIS — L97424 Non-pressure chronic ulcer of left heel and midfoot with necrosis of bone: Secondary | ICD-10-CM | POA: Diagnosis present

## 2022-12-08 DIAGNOSIS — Z6837 Body mass index (BMI) 37.0-37.9, adult: Secondary | ICD-10-CM

## 2022-12-08 DIAGNOSIS — H9193 Unspecified hearing loss, bilateral: Secondary | ICD-10-CM | POA: Diagnosis present

## 2022-12-08 DIAGNOSIS — L89613 Pressure ulcer of right heel, stage 3: Secondary | ICD-10-CM | POA: Diagnosis present

## 2022-12-08 DIAGNOSIS — L89316 Pressure-induced deep tissue damage of right buttock: Secondary | ICD-10-CM | POA: Diagnosis present

## 2022-12-08 DIAGNOSIS — J9690 Respiratory failure, unspecified, unspecified whether with hypoxia or hypercapnia: Secondary | ICD-10-CM | POA: Diagnosis not present

## 2022-12-08 DIAGNOSIS — R7881 Bacteremia: Secondary | ICD-10-CM

## 2022-12-08 DIAGNOSIS — L97514 Non-pressure chronic ulcer of other part of right foot with necrosis of bone: Secondary | ICD-10-CM | POA: Diagnosis present

## 2022-12-08 DIAGNOSIS — D509 Iron deficiency anemia, unspecified: Secondary | ICD-10-CM | POA: Diagnosis present

## 2022-12-08 DIAGNOSIS — N390 Urinary tract infection, site not specified: Secondary | ICD-10-CM | POA: Diagnosis not present

## 2022-12-08 DIAGNOSIS — I5043 Acute on chronic combined systolic (congestive) and diastolic (congestive) heart failure: Secondary | ICD-10-CM | POA: Diagnosis not present

## 2022-12-08 DIAGNOSIS — Z974 Presence of external hearing-aid: Secondary | ICD-10-CM

## 2022-12-08 DIAGNOSIS — B9562 Methicillin resistant Staphylococcus aureus infection as the cause of diseases classified elsewhere: Secondary | ICD-10-CM

## 2022-12-08 DIAGNOSIS — I5042 Chronic combined systolic (congestive) and diastolic (congestive) heart failure: Secondary | ICD-10-CM | POA: Diagnosis not present

## 2022-12-08 DIAGNOSIS — I482 Chronic atrial fibrillation, unspecified: Secondary | ICD-10-CM | POA: Diagnosis present

## 2022-12-08 DIAGNOSIS — I130 Hypertensive heart and chronic kidney disease with heart failure and stage 1 through stage 4 chronic kidney disease, or unspecified chronic kidney disease: Secondary | ICD-10-CM | POA: Diagnosis present

## 2022-12-08 DIAGNOSIS — L89312 Pressure ulcer of right buttock, stage 2: Secondary | ICD-10-CM | POA: Diagnosis present

## 2022-12-08 DIAGNOSIS — Z86718 Personal history of other venous thrombosis and embolism: Secondary | ICD-10-CM

## 2022-12-08 DIAGNOSIS — Z792 Long term (current) use of antibiotics: Secondary | ICD-10-CM

## 2022-12-08 DIAGNOSIS — N179 Acute kidney failure, unspecified: Secondary | ICD-10-CM | POA: Diagnosis present

## 2022-12-08 DIAGNOSIS — N1832 Chronic kidney disease, stage 3b: Secondary | ICD-10-CM | POA: Diagnosis present

## 2022-12-08 DIAGNOSIS — E559 Vitamin D deficiency, unspecified: Secondary | ICD-10-CM | POA: Diagnosis present

## 2022-12-08 DIAGNOSIS — D649 Anemia, unspecified: Secondary | ICD-10-CM | POA: Diagnosis present

## 2022-12-08 DIAGNOSIS — L89326 Pressure-induced deep tissue damage of left buttock: Secondary | ICD-10-CM | POA: Diagnosis present

## 2022-12-08 DIAGNOSIS — L89152 Pressure ulcer of sacral region, stage 2: Secondary | ICD-10-CM | POA: Diagnosis present

## 2022-12-08 DIAGNOSIS — L89322 Pressure ulcer of left buttock, stage 2: Secondary | ICD-10-CM | POA: Diagnosis present

## 2022-12-08 DIAGNOSIS — Z66 Do not resuscitate: Secondary | ICD-10-CM | POA: Diagnosis present

## 2022-12-08 DIAGNOSIS — Z96653 Presence of artificial knee joint, bilateral: Secondary | ICD-10-CM | POA: Diagnosis present

## 2022-12-08 DIAGNOSIS — R339 Retention of urine, unspecified: Secondary | ICD-10-CM | POA: Diagnosis present

## 2022-12-08 DIAGNOSIS — E785 Hyperlipidemia, unspecified: Secondary | ICD-10-CM | POA: Diagnosis present

## 2022-12-08 DIAGNOSIS — Z87891 Personal history of nicotine dependence: Secondary | ICD-10-CM | POA: Diagnosis not present

## 2022-12-08 DIAGNOSIS — J90 Pleural effusion, not elsewhere classified: Secondary | ICD-10-CM | POA: Diagnosis present

## 2022-12-08 DIAGNOSIS — I2510 Atherosclerotic heart disease of native coronary artery without angina pectoris: Secondary | ICD-10-CM | POA: Diagnosis present

## 2022-12-08 DIAGNOSIS — Z95828 Presence of other vascular implants and grafts: Secondary | ICD-10-CM | POA: Diagnosis not present

## 2022-12-08 DIAGNOSIS — I4821 Permanent atrial fibrillation: Secondary | ICD-10-CM | POA: Diagnosis present

## 2022-12-08 DIAGNOSIS — Z79899 Other long term (current) drug therapy: Secondary | ICD-10-CM

## 2022-12-08 DIAGNOSIS — E11621 Type 2 diabetes mellitus with foot ulcer: Secondary | ICD-10-CM | POA: Diagnosis present

## 2022-12-08 DIAGNOSIS — Z952 Presence of prosthetic heart valve: Secondary | ICD-10-CM

## 2022-12-08 DIAGNOSIS — L97524 Non-pressure chronic ulcer of other part of left foot with necrosis of bone: Secondary | ICD-10-CM | POA: Diagnosis present

## 2022-12-08 DIAGNOSIS — R195 Other fecal abnormalities: Secondary | ICD-10-CM | POA: Diagnosis not present

## 2022-12-08 DIAGNOSIS — Z885 Allergy status to narcotic agent status: Secondary | ICD-10-CM

## 2022-12-08 DIAGNOSIS — Z8379 Family history of other diseases of the digestive system: Secondary | ICD-10-CM

## 2022-12-08 DIAGNOSIS — Z7901 Long term (current) use of anticoagulants: Secondary | ICD-10-CM

## 2022-12-08 DIAGNOSIS — K5900 Constipation, unspecified: Secondary | ICD-10-CM | POA: Diagnosis present

## 2022-12-08 DIAGNOSIS — A4102 Sepsis due to Methicillin resistant Staphylococcus aureus: Secondary | ICD-10-CM | POA: Insufficient documentation

## 2022-12-08 DIAGNOSIS — R410 Disorientation, unspecified: Secondary | ICD-10-CM | POA: Diagnosis not present

## 2022-12-08 DIAGNOSIS — Z8249 Family history of ischemic heart disease and other diseases of the circulatory system: Secondary | ICD-10-CM

## 2022-12-08 LAB — CULTURE, BODY FLUID W GRAM STAIN -BOTTLE: Special Requests: ADEQUATE

## 2022-12-08 MED ORDER — ACETAMINOPHEN 325 MG PO TABS
325.0000 mg | ORAL_TABLET | ORAL | Status: DC | PRN
  Administered 2022-12-10 – 2022-12-22 (×13): 650 mg via ORAL
  Filled 2022-12-08 (×15): qty 2

## 2022-12-08 MED ORDER — POLYETHYLENE GLYCOL 3350 17 G PO PACK: 17.0000 g | PACK | Freq: Every day | ORAL | 0 refills | Status: DC

## 2022-12-08 MED ORDER — ADULT MULTIVITAMIN W/MINERALS CH
1.0000 | ORAL_TABLET | Freq: Every day | ORAL | Status: DC
  Administered 2022-12-09 – 2022-12-23 (×15): 1 via ORAL
  Filled 2022-12-08 (×15): qty 1

## 2022-12-08 MED ORDER — ENSURE ENLIVE PO LIQD: 237.0000 mL | Freq: Three times a day (TID) | ORAL | 12 refills | Status: DC

## 2022-12-08 MED ORDER — APIXABAN 2.5 MG PO TABS
2.5000 mg | ORAL_TABLET | Freq: Two times a day (BID) | ORAL | Status: DC
  Administered 2022-12-08: 2.5 mg via ORAL
  Filled 2022-12-08: qty 1

## 2022-12-08 MED ORDER — TRAMADOL HCL 50 MG PO TABS
50.0000 mg | ORAL_TABLET | Freq: Four times a day (QID) | ORAL | Status: DC | PRN
  Administered 2022-12-09: 50 mg via ORAL
  Filled 2022-12-08: qty 1

## 2022-12-08 MED ORDER — ALBUTEROL SULFATE (2.5 MG/3ML) 0.083% IN NEBU
2.5000 mg | INHALATION_SOLUTION | RESPIRATORY_TRACT | Status: DC | PRN
  Filled 2022-12-08: qty 3

## 2022-12-08 MED ORDER — LINEZOLID 600 MG PO TABS: 600.0000 mg | ORAL_TABLET | Freq: Two times a day (BID) | ORAL | 0 refills | Status: DC

## 2022-12-08 MED ORDER — PANTOPRAZOLE SODIUM 40 MG PO TBEC
40.0000 mg | DELAYED_RELEASE_TABLET | Freq: Every day | ORAL | Status: DC
  Administered 2022-12-09 – 2022-12-21 (×13): 40 mg via ORAL
  Filled 2022-12-08 (×13): qty 1

## 2022-12-08 MED ORDER — AMIODARONE HCL 200 MG PO TABS
200.0000 mg | ORAL_TABLET | Freq: Every day | ORAL | Status: DC
  Administered 2022-12-09 – 2022-12-23 (×15): 200 mg via ORAL
  Filled 2022-12-08 (×15): qty 1

## 2022-12-08 MED ORDER — CHLORHEXIDINE GLUCONATE CLOTH 2 % EX PADS: 6.0000 | MEDICATED_PAD | Freq: Every day | CUTANEOUS | Status: DC

## 2022-12-08 MED ORDER — ASPIRIN 81 MG PO CHEW: 81.0000 mg | CHEWABLE_TABLET | Freq: Every day | ORAL | Status: DC

## 2022-12-08 MED ORDER — TAMSULOSIN HCL 0.4 MG PO CAPS
0.4000 mg | ORAL_CAPSULE | Freq: Every day | ORAL | Status: DC
  Administered 2022-12-09 – 2022-12-23 (×15): 0.4 mg via ORAL
  Filled 2022-12-08 (×15): qty 1

## 2022-12-08 MED ORDER — ONDANSETRON HCL 4 MG/2ML IJ SOLN
4.0000 mg | Freq: Four times a day (QID) | INTRAMUSCULAR | Status: DC | PRN
  Administered 2022-12-16 – 2022-12-21 (×2): 4 mg via INTRAVENOUS
  Filled 2022-12-08 (×2): qty 2

## 2022-12-08 MED ORDER — SENNA 8.6 MG PO TABS: 1.0000 | ORAL_TABLET | Freq: Two times a day (BID) | ORAL | 0 refills | Status: DC

## 2022-12-08 MED ORDER — MELATONIN 5 MG PO TABS
5.0000 mg | ORAL_TABLET | Freq: Every evening | ORAL | Status: DC | PRN
  Administered 2022-12-10 – 2022-12-19 (×4): 5 mg via ORAL
  Filled 2022-12-08 (×4): qty 1

## 2022-12-08 MED ORDER — ASPIRIN 81 MG PO CHEW
81.0000 mg | CHEWABLE_TABLET | Freq: Every day | ORAL | Status: DC
  Administered 2022-12-09 – 2022-12-23 (×15): 81 mg via ORAL
  Filled 2022-12-08 (×15): qty 1

## 2022-12-08 MED ORDER — MIDODRINE HCL 10 MG PO TABS: 10.0000 mg | ORAL_TABLET | Freq: Three times a day (TID) | ORAL | Status: DC

## 2022-12-08 MED ORDER — ONDANSETRON HCL 4 MG PO TABS
4.0000 mg | ORAL_TABLET | Freq: Four times a day (QID) | ORAL | Status: DC | PRN
  Administered 2022-12-19 – 2022-12-23 (×2): 4 mg via ORAL
  Filled 2022-12-08 (×2): qty 1

## 2022-12-08 MED ORDER — POLYETHYLENE GLYCOL 3350 17 G PO PACK
17.0000 g | PACK | Freq: Every day | ORAL | Status: DC
  Administered 2022-12-09: 17 g via ORAL
  Filled 2022-12-08: qty 1

## 2022-12-08 MED ORDER — MIDODRINE HCL 5 MG PO TABS
10.0000 mg | ORAL_TABLET | Freq: Three times a day (TID) | ORAL | Status: DC
  Administered 2022-12-09 – 2022-12-23 (×43): 10 mg via ORAL
  Filled 2022-12-08 (×43): qty 2

## 2022-12-08 MED ORDER — SODIUM CHLORIDE 0.9% FLUSH: 10.0000 mL | INTRAVENOUS | Status: DC | PRN

## 2022-12-08 MED ORDER — LINEZOLID 600 MG PO TABS
600.0000 mg | ORAL_TABLET | Freq: Two times a day (BID) | ORAL | Status: DC
  Administered 2022-12-08 – 2022-12-20 (×23): 600 mg via ORAL
  Filled 2022-12-08 (×25): qty 1

## 2022-12-08 MED ORDER — SENNA 8.6 MG PO TABS
1.0000 | ORAL_TABLET | Freq: Two times a day (BID) | ORAL | Status: DC
  Administered 2022-12-08 – 2022-12-23 (×23): 8.6 mg via ORAL
  Filled 2022-12-08 (×28): qty 1

## 2022-12-08 MED ORDER — LORATADINE 10 MG PO TABS
10.0000 mg | ORAL_TABLET | Freq: Every day | ORAL | Status: DC
  Administered 2022-12-09 – 2022-12-23 (×15): 10 mg via ORAL
  Filled 2022-12-08 (×15): qty 1

## 2022-12-08 MED ORDER — VITAMIN C 500 MG PO TABS
500.0000 mg | ORAL_TABLET | Freq: Every day | ORAL | Status: DC
  Administered 2022-12-09 – 2022-12-23 (×15): 500 mg via ORAL
  Filled 2022-12-08 (×15): qty 1

## 2022-12-08 MED ORDER — ENSURE ENLIVE PO LIQD
237.0000 mL | Freq: Three times a day (TID) | ORAL | Status: DC
  Administered 2022-12-08 – 2022-12-10 (×5): 237 mL via ORAL

## 2022-12-08 MED ORDER — APIXABAN 2.5 MG PO TABS
2.5000 mg | ORAL_TABLET | Freq: Two times a day (BID) | ORAL | Status: DC
  Administered 2022-12-08 – 2022-12-23 (×30): 2.5 mg via ORAL
  Filled 2022-12-08 (×31): qty 1

## 2022-12-08 MED ORDER — PANTOPRAZOLE SODIUM 40 MG PO TBEC: 40.0000 mg | DELAYED_RELEASE_TABLET | Freq: Every day | ORAL | Status: DC

## 2022-12-08 NOTE — Plan of Care (Signed)

## 2022-12-08 NOTE — Progress Notes (Signed)
Patient arrieved at 6 pm. Skin assessment completed with [facility] RN, pictures updated. VS WNL. Endorsed to [place] Nurse Frederick Peers RN

## 2022-12-08 NOTE — Progress Notes (Signed)
RT at bedside to find pt on [place] with BiPAP (V60) on stby at bedside.

## 2022-12-08 NOTE — Progress Notes (Signed)
Physical Therapy Treatment Patient Details Name: [name] [name] MRN: [id number] DOB: [birth year] Today's Date: 12/08/2022   History of Present Illness Pt is an 82 year old male admitted on 11/28/22 for hypoxia.  Prior history of chronic systolic heart failure EF of 40 to 45%, history of bioprosthetic aortic valve replacement, history of aortic aneurysm, peripheral vascular disease, chronic A-fib on Eliquis, chronic wounds on his bilateral lower extremities, history of anemia.    PT Comments    PT saw pt briefly before appt to reapply braces on legs, which were more supportive to feet afterward.  He is up to side of bed with difficulty getting up, weak and has limited trunk control of balance and direction of sitting effort.  Pt then required a great deal of help and instructions to stand and step to chair.  He is repositioned on chair, legs elevated and put in boots to protect skin on feet.  Pt is comfortable but did complain about being a lot more tired today.  Will recommend PT continue to support dc to <3 hours a day PT with care for his balance and gait, to increase LE strength and to work on his awareness of safety and his deficits for avoiding a fall.  Pt is difficult to understand and wife left before session, but will try to increase her observation of PT sessions to help with his needs.  Follow for acute PT goals.   Recommendations for follow up therapy are one component of a multi-disciplinary discharge planning process, led by the attending physician.  Recommendations may be updated based on patient status, additional functional criteria and insurance authorization.  Follow Up Recommendations  Can patient physically be transported by private vehicle: No    Assistance Recommended at Discharge Frequent or constant Supervision/Assistance  Patient can return home with the following A lot of help with walking and/or transfers;A lot of help with bathing/dressing/bathroom;Assistance with  cooking/housework;Direct supervision/assist for medications management;Assist for transportation;Help with stairs or ramp for entrance   Equipment Recommendations  Other (comment)    Recommendations for Other Services       Precautions / Restrictions Precautions Precautions: Fall;Other (comment) Precaution Comments: Contact Required Braces or Orthoses: Other Brace (Prevalon boots) Other Brace: for skin protection Restrictions Weight Bearing Restrictions: No     Mobility  Bed Mobility Overal bed mobility: Needs Assistance Bed Mobility: Supine to Sit     Supine to sit: Mod assist     General bed mobility comments: mod to move legs and assist trunk for ward    Transfers Overall transfer level: Needs assistance Equipment used: Rolling walker (2 wheels) Transfers: Sit to/from Stand, Bed to chair/wheelchair/BSC Sit to Stand: Mod assist   Step pivot transfers: Mod assist, From elevated surface       General transfer comment: needs more help today to stand and move legs, tends to let feet slide a bit    Ambulation/Gait               General Gait Details: deferred   Stairs             Wheelchair Mobility    Modified Rankin (Stroke Patients Only)       Balance Overall balance assessment: Needs assistance Sitting-balance support: Feet supported, Bilateral upper extremity supported Sitting balance-Leahy Scale: Poor     Standing balance support: Bilateral upper extremity supported, During functional activity Standing balance-Leahy Scale: Poor  Cognition Arousal/Alertness: Awake/alert Behavior During Therapy: Flat affect Overall Cognitive Status: No family/caregiver present to determine baseline cognitive functioning                                 General Comments: either speaks in limited way or is difficult to understand        Exercises      General Comments General comments (skin  integrity, edema, etc.): Pt is functionally weaker today, struggling to sit up and balance to stand.  Weak and unsteady to stand and step to chair      Pertinent Vitals/Pain Pain Assessment Pain Assessment: Faces Faces Pain Scale: Hurts a little bit Facial Expression: smiling or inexpressive Body Language: relaxed Consolability: no need to console Pain Location: L foot Pain Descriptors / Indicators: Guarding, Grimacing Pain Intervention(s): Limited activity within patient's tolerance, Premedicated before session, Repositioned, Monitored during session    Home Living                          Prior Function            PT Goals (current goals can now be found in the care plan section) Acute Rehab PT Goals Patient Stated Goal: to go home    Frequency    Min 1X/week      PT Plan Current plan remains appropriate    Co-evaluation              AM-PAC PT "6 Clicks" Mobility   Outcome Measure  Help needed turning from your back to your side while in a flat bed without using bedrails?: A Lot Help needed moving from lying on your back to sitting on the side of a flat bed without using bedrails?: A Lot Help needed moving to and from a bed to a chair (including a wheelchair)?: A Lot Help needed standing up from a chair using your arms (e.g., wheelchair or bedside chair)?: A Lot Help needed to walk in hospital room?: Total Help needed climbing 3-5 steps with a railing? : Total 6 Click Score: 10    End of Session Equipment Utilized During Treatment: Gait belt;Oxygen Activity Tolerance: Patient limited by fatigue Patient left: in chair;with call bell/phone within reach;with chair alarm set Nurse Communication: Mobility status PT Visit Diagnosis: Other abnormalities of gait and mobility (R26.89)     Time: 8295-6213 PT Time Calculation (min) (ACUTE ONLY): 24 min  Charges:  $Therapeutic Exercise: 8-22 mins $Therapeutic Activity: 8-22 mins        Ivar Drape 12/08/2022, 2:32 PM  Samul Dada, PT PhD Acute Rehab Dept. Number: [facility] [id number] and [facility] [phone]

## 2022-12-08 NOTE — Discharge Summary (Addendum)
Physician Discharge Summary  [name] [name] WUJ:[id number] DOB: 01-Jun-1941 DOA: 11/28/2022  PCP: Bobbye Morton, MD  Admit date: 11/28/2022 Discharge date: 12/08/22  Admitted From: Home Disposition:  CIR  Discharge Condition:Stable CODE STATUS:DNR Diet recommendation: Heart Healthy   Brief/Interim Summary: Patient is 82 year old male with history of chronic systolic CHF, bioprosthetic aortic valve replacement, aortic aneurysm, peripheral vascular disease, chronic A-fib on Eliquis, chronic wounds on bilateral lower extremities, anemia who was transferred from [facility] for evaluation of hypoxia.  He was initially seen at wound care clinic and was noted to be hypoxic and was sent to the emergency department.  He was initially diagnosed at the [facility] to have MRSA pneumonia and septicemia  and was discharged home on IV abx and oral doxycycline.  On repeat presentation to the emergency department at [facility], CT scan showed large loculated left-sided pleural effusion, cavitary lesion in the right upper lobe.  Patient was transferred to [facility]. ID was consulted here.  Prolonged hospital course.  Now clinically improving,abx changed to oral.  PT/OT consulted, recommended CIR on discharge.  Medically stable for discharge tomorrow to CIR  Following problems were addressed during the hospitalization:  Loculated pleural effusion with cavitary pneumonia, MRSA bacteremia with severe sepsis with septic shock on presentation: Underwent left-sided thoracentesis with removal of 900 mL of pleural fluid.  No fluid analysis was available.  Repeat CT showed moderate right-sided layering effusion, multifocal necrotizing pneumonia.  Initially on septic shock and acute renal failure.  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  Patient was suspected that he cannot tolerate TEE, ID recommended prolonged suppression with antibiotics.  Currently on Zyvox.  Recommended 4 weeks from 6/12  followed by doxycycline suppression.  Patient will follow-up with ID as an outpatient. Underwent repeat thoracentesis on 6/16.  Cultures have not shown any growth, cytology showed mesothelial cells.  He remains comfortable on 2-3 L of oxygen.   Acute respiratory failure with hypoxia: Secondary to pleural effusion, cavitary pneumonia.  Continue supplemental oxygen as needed. Needed BiPAP intermittently.He was not on oxygen at home.  He probably needs oxygen on discharge to home from CIR.    Permanent A-fib: Currently heart rate is well-controlled.  On amiodarone.  On low dose eliquis.  Toprol on hold due to hypotension   Coronary artery disease: Continue aspirin, plan for starting on statin on discharge.No chest pain currently   Chronic combined systolic/diastolic CHF: EF of 35 to 40%.  Not volume overloaded.  Currently requiring midodrine to sustain blood pressure.  Cardiology following.  Lasix on hold due to hypotension.   History of thoracic aortic dissection/aortic insufficiency: Status post aortic valve replacement.  TTE on this admission showed normal functioning aortic valve.  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  Patient is not a candidate for repair   AKI on  CKD stage IIIb: Creatinine continues to improve and is currently close to baseline.  Baseline creatinine of 1.5   Normocytic anemia: Currently no evidence of acute blood loss.  Continue to monitor hemoglobin intermittently.   Elevated liver enzymes: Currently stable, suspected to be from congestive hepatic liver disease   Goals of care: Elderly male with multiple problems.  Currently DNR.  Palliative care was following.  Palliative care met with the patient/familt, no escalation of treatment.  Sacral ulcer/bilateral toe wounds: Patient has laceration/wound on bilateral fourth toe.  Dr. Lajoyce Corners saw him him  earlier and did not recommend any intervention because  he has very poor  vascular supply.  I have requested Dr. Lajoyce Corners to follow-up on him as per request of family.  Dr. Lajoyce Corners will see him tomorrow morning. 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 3)Do a CBC and BMP tests in 3 days   Discharge wound care:   Complete by: As directed    As per wound care   Increase activity slowly   Complete by: As directed       Allergies as of 12/08/2022       Reactions   Lortab [hydrocodone-acetaminophen] Hives   No trouble breathing   Morphine And Codeine Hives   Other Other (See Comments)   Staples from surgery caused infection        Medication List     STOP taking these medications    amoxicillin 500 MG capsule Commonly known as: AMOXIL   DAPTOmycin 500 MG injection Commonly known as: CUBICIN   furosemide 40 MG tablet Commonly known as: LASIX   lansoprazole 30 MG capsule Commonly known as: PREVACID Replaced by: pantoprazole 40 MG tablet   metoprolol succinate 25 MG 24 hr tablet Commonly known as: TOPROL-XL       TAKE these medications    acetaminophen 500 MG tablet Commonly known as: TYLENOL Take 2 tablets (1,000 mg total) by mouth every 6 (six) hours as needed. What changed: reasons to take this    albuterol (2.5 MG/3ML) 0.083% nebulizer solution Commonly known as: PROVENTIL Take by nebulization.   amiodarone 200 MG tablet Commonly known as: PACERONE Take 1 tablet (200 mg total) by mouth daily.   apixaban 2.5 MG Tabs tablet Commonly known as: ELIQUIS Take 1 tablet (2.5 mg total) by mouth 2 (two) times daily.   aspirin 81 MG chewable tablet Chew 1 tablet (81 mg total) by mouth daily. Start taking on: December 09, 2022   feeding supplement Liqd Take 237 mLs by mouth 3 (three) times daily between meals.   ferrous sulfate 325 (65 FE) MG tablet Commonly known as: SV Iron Take 1 tablet (325 mg total) by mouth daily with breakfast.   levocetirizine 5 MG tablet Commonly known as: XYZAL Take 5 mg by mouth daily.   linezolid 600 MG tablet Commonly known as: ZYVOX Take 1 tablet (600 mg total) by mouth every 12 (twelve) hours. Start taking on: December 09, 2022   midodrine 10 MG tablet Commonly known as: PROAMATINE Take 1 tablet (10 mg total) by mouth 3 (three) times daily with meals.   multivitamin with minerals Tabs tablet Take 1 tablet by mouth daily.   pantoprazole 40 MG tablet Commonly known as: PROTONIX Take 1 tablet (40 mg total) by mouth daily. Start  taking on: December 09, 2022 Replaces: lansoprazole 30 MG capsule   polyethylene glycol 17 g packet Commonly known as: MIRALAX / GLYCOLAX Take 17 g by mouth daily. Start taking on: December 09, 2022   senna 8.6 MG Tabs tablet Commonly known as: SENOKOT Take 1 tablet (8.6 mg total) by mouth 2 (two) times daily.   tamsulosin 0.4 MG Caps capsule Commonly known as: FLOMAX Take 0.4 mg by mouth.   traMADol 50 MG tablet Commonly known as: ULTRAM Take 50 mg by mouth every 6 (six) hours as needed for moderate pain.   ZINC PO Take 1 tablet by mouth daily at 6 (six) AM.               Discharge Care Instructions  (From admission, onward)           Start     Ordered   12/08/22 0000  Discharge wound care:        Comments: As per wound care   12/08/22 1313            Allergies  Allergen Reactions   Lortab [Hydrocodone-Acetaminophen] Hives    No trouble breathing   Morphine And Codeine Hives   Other Other (See Comments)    Staples from surgery caused infection    Consultations: Cardiology, palliative care, orthopedics   Procedures/Studies: US THORACENTESIS ASP PLEURAL SPACE W/IMG GUIDE  Result Date: 12/03/2022 INDICATION: Patient with large left pleural effusion. Request received for diagnostic and therapeutic thoracentesis EXAM: ULTRASOUND GUIDED LEFT THORACENTESIS MEDICATIONS: 8 cc 1% lidocaine COMPLICATIONS: None immediate. PROCEDURE: An ultrasound guided thoracentesis was thoroughly discussed with the patient and questions answered. The benefits, risks, alternatives and complications were also discussed. The patient understands and wishes to proceed with the procedure. Written consent was obtained. Ultrasound was performed to localize and mark an adequate pocket of fluid in the left chest. The area was then prepped and draped in the normal sterile fashion. 1% Lidocaine was used for local anesthesia. Under ultrasound guidance a 6 Fr Safe-T-Centesis catheter was introduced. Thoracentesis was performed. The catheter was removed and a dressing applied. FINDINGS: A total of approximately 700 mL of dark yellow fluid was removed. Samples were sent to the laboratory as requested by the clinical team. IMPRESSION: Successful ultrasound guided left thoracentesis yielding 700 mL of pleural fluid. Follow-up chest x-ray revealed no evidence of pneumothorax. Procedure performed by Mina Marble, PA-C Electronically Signed   By: Corlis Leak M.D.   On: 12/03/2022 12:40   DG Chest 1 View  Result Date: 12/03/2022 CLINICAL DATA:  Left thoracentesis EXAM: CHEST  1 VIEW COMPARISON:  12/02/2022 FINDINGS: Stable cardiomegaly status post sternotomy, cardiac valve replacement, and aortic stent graft. Moderate  bilateral pleural effusions, decreased on the left. Persistent airspace opacities bilaterally. No pneumothorax. Right-sided PICC line remains in place. IMPRESSION: Moderate bilateral pleural effusions, decreased on the left status post thoracentesis. No pneumothorax. Electronically Signed   By: Duanne Guess D.O.   On: 12/03/2022 10:17   VAS [place] ABI WITH/WO TBI  Result Date: 12/02/2022  LOWER EXTREMITY DOPPLER STUDY Patient Name:  [name] [name]  Date of Exam:   11/30/2022 Medical Rec #: [id number]          Accession #:    [id number] Date of Birth: July 31, 1940          Patient Gender: M Patient Age:   65 years Exam Location:  [facility] Procedure:      VAS [place] ABI WITH/WO TBI Referring Phys:  MARCUS DUDA --------------------------------------------------------------------------------  Indications: Rest pain, ulceration, and peripheral artery disease. High Risk Factors: Hypertension, hyperlipidemia, coronary artery disease. Other Factors: Aortic valve replacement replacement of aortic root and ascending                aorta and replacement of aortic arch to the left subclavian                artery. 2010.  Comparison Study: No prior study on file Performing Technologist: Sherren Kerns RVS  Examination Guidelines: A complete evaluation includes at minimum, Doppler waveform signals and systolic blood pressure reading at the level of bilateral brachial, anterior tibial, and posterior tibial arteries, when vessel segments are accessible. Bilateral testing is considered an integral part of a complete examination. Photoelectric Plethysmograph (PPG) waveforms and toe systolic pressure readings are included as required and additional duplex testing as needed. Limited examinations for reoccurring indications may be performed as noted.  ABI Findings: +---------+------------------+-----+-------------------+---------+ Right    Rt Pressure (mmHg)IndexWaveform           Comment    +---------+------------------+-----+-------------------+---------+ Brachial                        multiphasic        PICC line +---------+------------------+-----+-------------------+---------+ PTA      55                0.54 dampened monophasic          +---------+------------------+-----+-------------------+---------+ DP       48                0.48 dampened monophasic          +---------+------------------+-----+-------------------+---------+ Great Toe0                 0.00 Absent                       +---------+------------------+-----+-------------------+---------+ +---------+------------------+-----+-------------------+-------+ Left     Lt Pressure (mmHg)IndexWaveform           Comment +---------+------------------+-----+-------------------+-------+ Brachial 101                    multiphasic                +---------+------------------+-----+-------------------+-------+ PTA      58                0.57 dampened monophasic        +---------+------------------+-----+-------------------+-------+ DP       0                 0.00 absent                     +---------+------------------+-----+-------------------+-------+ Great Toe0                 0.00 Absent                     +---------+------------------+-----+-------------------+-------+ +-------+-----------+-----------+------------+------------+ ABI/TBIToday's ABIToday's TBIPrevious ABIPrevious TBI +-------+-----------+-----------+------------+------------+ Right  0.54       absent                              +-------+-----------+-----------+------------+------------+ Left   0.57       absent                              +-------+-----------+-----------+------------+------------+  Summary: Right: Resting right ankle-brachial index indicates moderate right lower extremity arterial disease. The right toe-brachial index is abnormal. Left: Resting left ankle-brachial index indicates moderate  left lower extremity arterial disease. The left toe-brachial index is abnormal. *See table(s) above for measurements and observations.  Suggest Peripheral Vascular Consult. Electronically signed by Heath Lark on 12/02/2022 at 5:43:34 PM.    Final    DG CHEST PORT 1 VIEW  Result Date: 12/02/2022 CLINICAL DATA:  142230 Pleural effusion 142230 EXAM: PORTABLE CHEST 1 VIEW COMPARISON:  November 28, 2022 FINDINGS: The cardiomediastinal silhouette is unchanged in contour.Status post median sternotomy head with stent graft placement throughout the aorta. RIGHT upper extremity PICC tip terminates over the superior cavoatrial junction. Large LEFT pleural effusion, similar in comparison to most recent prior. Small RIGHT pleural effusion. No pneumothorax. Diffuse bilateral airspace opacities, with mildly increased confluence in the RIGHT lateral lower lung base in comparison to prior. Similar confluent opacity of the RIGHT lateral apex. IMPRESSION: 1. Similar appearance of large LEFT and small RIGHT pleural effusions. 2. Diffuse bilateral airspace opacities, with mildly increased confluence in the RIGHT lateral lower lung base in comparison to prior. Electronically Signed   By: Meda Klinefelter M.D.   On: 12/02/2022 13:16   ECHOCARDIOGRAM COMPLETE  Result Date: 12/01/2022    ECHOCARDIOGRAM REPORT   Patient Name:   [name] [name] Date of Exam: 12/01/2022 Medical Rec #:  [id number]         Height:       70.5 in Accession #:    [id number]        Weight:       190.9 lb Date of Birth:  February 18, 1941         BSA:          2.057 m Patient Age:    82 years          BP:           102/68 mmHg Patient Gender: M                 HR:           85 bpm. Exam Location:  Inpatient Procedure: 2D Echo, Color Doppler and Cardiac Doppler Indications:    I50.21 Acute systolic (congestive) heart failure  History:        Patient has prior history of Echocardiogram examinations, most                 recent 11/02/2022. CHF, CAD, Arrythmias:Atrial  Fibrillation and                 Atrial Flutter; Risk Factors:Hypertension. 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                 Aortic Valve: unknown bioprosthetic valve is present in the                 aortic position. Procedure Date: 2010.  Sonographer:    Irving Burton Senior RDCS Referring Phys: [id number] Corrin Parker  Sonographer Comments: Scanned upright on BiPap IMPRESSIONS  1. Left ventricular ejection fraction, by estimation, is 35 to 40%. The left ventricle has moderately decreased function. The left ventricle demonstrates global hypokinesis. There is mild asymmetric left ventricular hypertrophy of the septal segment. Left ventricular diastolic parameters are consistent with Grade III diastolic dysfunction (restrictive).  2. Right ventricular systolic function is moderately reduced.  The right ventricular size is moderately enlarged. There is moderately elevated pulmonary artery systolic pressure. The estimated right ventricular systolic pressure is 56.5 mmHg.  3. Left atrial size was severely dilated.  4. Right atrial size was severely dilated.  5. The mitral valve is abnormal. Moderate mitral valve regurgitation. Moderate mitral annular calcification.  6. The tricuspid valve is abnormal. Tricuspid valve regurgitation is moderate.  7. 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 The aortic valve has been repaired/replaced. Aortic valve regurgitation is not visualized. There is a unknown bioprosthetic valve present in the aortic position. Procedure Date: 2010. Echo findings are consistent with normal structure and function of the aortic valve prosthesis. Aortic valve mean gradient measures 9.5 mmHg.  8. The pulmonic valve was abnormal.  9. The inferior vena cava is dilated in size with <50% respiratory variability, suggesting right atrial pressure of 15 mmHg. FINDINGS  Left Ventricle: Left ventricular ejection fraction, by estimation, is 35 to 40%. The left ventricle has  moderately decreased function. The left ventricle demonstrates global hypokinesis. The left ventricular internal cavity size was normal in size. There is mild asymmetric left ventricular hypertrophy of the septal segment. Left ventricular diastolic parameters are consistent with Grade III diastolic dysfunction (restrictive). Right Ventricle: The right ventricular size is moderately enlarged. No increase in right ventricular wall thickness. Right ventricular systolic function is moderately reduced. There is moderately elevated pulmonary artery systolic pressure. The tricuspid  regurgitant velocity is 3.22 m/s, and with an assumed right atrial pressure of 15 mmHg, the estimated right ventricular systolic pressure is 56.5 mmHg. Left Atrium: Left atrial size was severely dilated. Right Atrium: Right atrial size was severely dilated. Pericardium: There is no evidence of pericardial effusion. Mitral Valve: The mitral valve is abnormal. There is mild calcification of the mitral valve leaflet(s). Moderate mitral annular calcification. Moderate mitral valve regurgitation. MV peak gradient, 105.3 mmHg. Tricuspid Valve: The tricuspid valve is abnormal. Tricuspid valve regurgitation is moderate. Aortic Valve: Post AVR valve type unknown no PVL and normal gradients. The aortic valve has been repaired/replaced. Aortic valve regurgitation is not visualized. Aortic valve mean gradient measures 9.5 mmHg. Aortic valve peak gradient measures 16.1 mmHg.  Aortic valve area, by VTI measures 2.07 cm. There is a unknown bioprosthetic valve present in the aortic position. Procedure Date: 2010. Echo findings are consistent with normal structure and function of the aortic valve prosthesis. Pulmonic Valve: The pulmonic valve was abnormal. Pulmonic valve regurgitation is mild to moderate. Aorta: The aortic root and ascending aorta are structurally normal, with no evidence of dilitation. Venous: The inferior vena cava is dilated in size with  less than 50% respiratory variability, suggesting right atrial pressure of 15 mmHg. IAS/Shunts: No atrial level shunt detected by color flow Doppler.  LEFT VENTRICLE PLAX 2D LVIDd:         5.90 cm LVIDs:         4.90 cm LV PW:         1.00 cm LV IVS:        1.20 cm LVOT diam:     2.20 cm LV SV:         82 LV SV Index:   40 LVOT Area:     3.80 cm  LV Volumes (MOD) LV vol d, MOD A2C: 140.0 ml LV vol d, MOD A4C: 134.0 ml LV vol s, MOD A2C: 78.1 ml LV vol s, MOD A4C: 88.0 ml LV SV MOD A2C:     61.9 ml  LV SV MOD A4C:     134.0 ml LV SV MOD BP:      54.2 ml RIGHT VENTRICLE RV S prime:     5.66 cm/s TAPSE (M-mode): 1.1 cm LEFT ATRIUM              Index        RIGHT ATRIUM           Index LA diam:        5.70 cm  2.77 cm/m   RA Area:     43.40 cm LA Vol (A2C):   135.0 ml 65.62 ml/m  RA Volume:   191.00 ml 92.84 ml/m LA Vol (A4C):   142.0 ml 69.02 ml/m LA Biplane Vol: 138.0 ml 67.08 ml/m  AORTIC VALVE AV Area (Vmax):    2.12 cm AV Area (Vmean):   2.27 cm AV Area (VTI):     2.07 cm AV Vmax:           200.50 cm/s AV Vmean:          147.000 cm/s AV VTI:            0.396 m AV Peak Grad:      16.1 mmHg AV Mean Grad:      9.5 mmHg LVOT Vmax:         112.00 cm/s LVOT Vmean:        87.700 cm/s LVOT VTI:          0.216 m LVOT/AV VTI ratio: 0.55  AORTA Ao Root diam: 3.20 cm Ao Asc diam:  3.70 cm MITRAL VALVE                  TRICUSPID VALVE MV Area (PHT): 4.31 cm       TR Peak grad:   41.5 mmHg MV Peak grad:  105.3 mmHg     TR Vmax:        322.00 cm/s MV Vmax:       5.13 m/s MR Peak grad:    112.8 mmHg   SHUNTS MR Mean grad:    72.0 mmHg    Systemic VTI:  0.22 m MR Vmax:         531.00 cm/s  Systemic Diam: 2.20 cm MR Vmean:        406.0 cm/s MR PISA:         2.26 cm MR PISA Eff ROA: 16 mm MR PISA Radius:  0.60 cm MV E velocity: 104.00 cm/s Charlton Haws MD Electronically signed by Charlton Haws MD Signature Date/Time: 12/01/2022/10:04:19 AM    Final    US RENAL  Result Date: 11/30/2022 CLINICAL DATA:  Acute kidney injury  EXAM: RENAL / URINARY TRACT ULTRASOUND COMPLETE COMPARISON:  11/28/2022 FINDINGS: Right Kidney: Renal measurements: 10.4 x 5.2 x 4.6 cm = volume: 131 mL. Echogenicity within normal limits. No mass or hydronephrosis visualized. Left Kidney: Renal measurements: 11.1 x 4.9 x 4.1 cm = volume: 116 mL. Echogenicity within normal limits. No mass or hydronephrosis visualized. Bladder: Appears normal for degree of bladder distention. Other: Right pleural effusion partially visualized. IMPRESSION: No acute findings.  No hydronephrosis. Right pleural effusion. Electronically Signed   By: Charlett Nose M.D.   On: 11/30/2022 22:19   CT CHEST WO CONTRAST  Result Date: 11/29/2022 CLINICAL DATA:  Pneumonia.  Left thoracentesis 11/28/2022. EXAM: CT CHEST WITHOUT CONTRAST TECHNIQUE: Multidetector CT imaging of the chest was performed following the standard protocol without IV contrast. RADIATION DOSE REDUCTION: This exam was performed  according to the departmental dose-optimization program which includes automated exposure control, adjustment of the mA and/or kV according to patient size and/or use of iterative reconstruction technique. COMPARISON:  11/27/2022. FINDINGS: Cardiovascular: Status post aortic valve replacement and tube graft repair of the ascending aorta as well as left carotid-subclavian bypass grafting. Endovascular stent grafting of the aortic arch and descending thoracic aorta is again noted with the proximal portion of the stent graft covering the left subclavian artery origin. 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 Extensive multi-vessel coronary artery calcification. Stable cardiomegaly. No pericardial effusion. Central pulmonary arteries are enlarged in keeping with changes of pulmonary arterial hypertension. Right upper extremity PICC line tip is seen at the superior  cavoatrial junction. Mediastinum/Nodes: Visualized thyroid is unremarkable. Pathologic mediastinal adenopathy appears slightly progressive with the index lymph node measuring 19 mm in short axis diameter within the precarinal lymph node groups axial image # 56/3. The esophagus is unremarkable. Lungs/Pleura: Focal consolidation within the a right apex and scattered nodular infiltrate throughout the right upper and, to a lesser extent, left upper and right middle lobes are again identified in keeping with multifocal infection in the acute setting. Punctate areas of cavitation are identified within the right apex. Superimposed mild emphysema. Small left pleural effusion appears partially loculated within the sub pulmonic region and appears decreased in size since prior examination but not completely evacuated. There is left-sided volume loss with subtotal collapse of the lower lobe and segmental atelectasis of the lingula. Moderate dependently layering right pleural effusion is present. Compressive atelectasis of the dependent right lower lobe noted. No pneumothorax. No central obstructing lesion. Bronchial wall thickening noted in keeping with airway inflammation. Upper Abdomen: No acute abnormality. Musculoskeletal: No acute bone abnormality. No lytic or blastic bone lesion. IMPRESSION: 1. Status post aortic valve replacement and tube graft repair of the ascending aorta as well as left carotid-subclavian bypass grafting. Endovascular stent grafting of the aortic arch and descending thoracic aorta is again noted with the proximal portion of the stent graft covering the left subclavian artery origin. 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 2. Extensive multi-vessel coronary artery calcification. Stable cardiomegaly. 3. Morphologic changes in keeping with pulmonary arterial hypertension. 4. Multifocal pulmonary consolidation and nodular infiltrate in keeping with  multifocal necrotizing infection. Findings appears stable since prior examination. 5. Moderate dependently layering right pleural effusion. 6. Small left pleural effusion appears partially loculated within the sub pulmonic region and appears decreased in size since prior examination but not completely evacuated. Associated left-sided volume loss with subtotal collapse of the lower lobe and segmental atelectasis of the lingula. 7. Pathologic mediastinal adenopathy, slightly progressive since prior examination, likely reactive. 8. Mild emphysema. Aortic Atherosclerosis (ICD10-I70.0) and Emphysema (ICD10-J43.9). Electronically Signed   By: Helyn Numbers M.D.   On: 11/29/2022 02:05   DG CHEST PORT 1 VIEW  Result Date: 11/28/2022 CLINICAL DATA:  PICC verification. EXAM: PORTABLE CHEST 1 VIEW COMPARISON:  Radiograph earlier today, CT yesterday FINDINGS: Tip of the right upper extremity PICC projects over the lower SVC. Unchanged heart size and mediastinal contours, aortic stent graft. Left pleural effusion may have diminished slightly from earlier today. Associated compressive atelectasis. Patchy airspace disease in the left mid lung. Right pleural effusion may have increased. Peripheral opacity in the right upper lung zone. No pneumothorax.  IMPRESSION: 1. Tip of the right upper extremity PICC projects over the lower SVC. 2. Shifting pleural effusions may be due to differences in positioning. 3. Bilateral airspace disease. Electronically Signed   By: Narda Rutherford M.D.   On: 11/28/2022 20:44      Subjective: Patient seen and examined at bedside today.  Hemodynamically stable.    Remains weak but comfortable.  On 2 to 3 L of oxygen per minute.  Denies shortness of breath or cough.  Wife at bedside.  Had a bowel movement last night  Discharge Exam: Vitals:   12/08/22 1116 12/08/22 1343  BP:  105/63  Pulse:    Resp:    Temp: 98.2 F (36.8 C)   SpO2:     Vitals:   12/08/22 0810 12/08/22 1017 12/08/22  1116 12/08/22 1343  BP: (!) 122/54 108/67  105/63  Pulse: 84     Resp: (!) 21 16    Temp: 98 F (36.7 C)  98.2 F (36.8 C)   TempSrc: Oral  Oral   SpO2: 100% 100%    Weight:        General: Pt is alert, awake, not in acute distress, very weak, chronically ill looking Cardiovascular: Irregularly irregular rhythm, no rubs, no gallops Respiratory: Diminished sounds bilaterally, no wheezing, no rhonchi Abdominal: Soft, NT, ND, bowel sounds + Extremities: no edema, no cyanosis, ulcers on bilateral toes,sacral ulcer    The results of significant diagnostics from this hospitalization (including imaging, microbiology, ancillary and laboratory) are listed below for reference.     Microbiology: Recent Results (from the past 240 hour(s))  MRSA Next Gen by PCR, Nasal     Status: Abnormal   Collection Time: 11/29/22  5:06 AM   Specimen: Nasal Mucosa; Nasal Swab  Result Value Ref Range Status   MRSA by PCR Next Gen DETECTED (A) NOT DETECTED Final    Comment: RESULT CALLED TO, READ BACK BY AND VERIFIED WITH: B HYDLEBURG,RN@0700  11/29/22 MK (NOTE) The GeneXpert MRSA Assay (FDA approved for NASAL specimens only), is one component of a comprehensive MRSA colonization surveillance program. It is not intended to diagnose MRSA infection nor to guide or monitor treatment for MRSA infections. Test performance is not FDA approved in patients less than 52 years old. Performed at [facility], [address] [place], [place] [address]   Culture, blood (x 2)     Status: None   Collection Time: 11/29/22  9:06 AM   Specimen: BLOOD RIGHT ARM  Result Value Ref Range Status   Specimen Description BLOOD RIGHT ARM  Final   Special Requests   Final    BOTTLES DRAWN AEROBIC ONLY Blood Culture adequate volume   Culture   Final    NO GROWTH 5 DAYS Performed at [facility], [address] [place], [place] [address]    Report Status 12/04/2022 FINAL  Final  Culture, blood (Routine X 2) w  Reflex to ID Panel     Status: None   Collection Time: 11/29/22  9:17 AM   Specimen: BLOOD RIGHT ARM  Result Value Ref Range Status   Specimen Description BLOOD RIGHT ARM  Final   Special Requests BOTTLES DRAWN AEROBIC ONLY  Final   Culture   Final    NO GROWTH 5 DAYS Performed at [facility], [address], [place], [place] [address]    Report Status 12/04/2022 FINAL  Final  Urine Culture     Status: None   Collection  Time: 11/29/22  6:00 PM   Specimen: Urine, Clean Catch  Result Value Ref Range Status   Specimen Description URINE, CLEAN CATCH  Final   Special Requests NONE Reflexed from [id number]  Final   Culture   Final    NO GROWTH Performed at [facility], [address] [place], [place] [address]    Report Status 11/30/2022 FINAL  Final  Culture, body fluid w Gram Stain-bottle     Status: None   Collection Time: 12/03/22  9:36 AM   Specimen: Fluid  Result Value Ref Range Status   Specimen Description FLUID PLEURAL  Final   Special Requests   Final    BOTTLES DRAWN AEROBIC AND ANAEROBIC Blood Culture adequate volume   Culture   Final    NO GROWTH 5 DAYS Performed at [facility], [address], [place], [place] [address]    Report Status 12/08/2022 FINAL  Final  Gram stain     Status: None   Collection Time: 12/03/22  9:36 AM   Specimen: Fluid  Result Value Ref Range Status   Specimen Description FLUID PLEURAL  Final   Special Requests SYRINGE  Final   Gram Stain   Final    RARE WBC SEEN NO ORGANISMS SEEN Performed at [facility], [address] [place], [place] [address]    Report Status 12/03/2022 FINAL  Final     Labs: BNP (last 3 results) Recent Labs    11/28/22 2146  BNP 1,270.5*   Basic Metabolic Panel: Recent Labs  Lab 12/03/22 0237 12/04/22 0315 12/05/22 0500 12/06/22 0539 12/07/22 0500  NA 134* 135 137 138 139  K 3.8 3.8 3.6 3.7 3.8  CL 95* 97* 97* 99 100  CO2 26 23 29 30 30   GLUCOSE 114* 106* 126* 118* 119*   BUN 42* 37* 34* 35* 39*  CREATININE 2.61* 2.17* 2.01* 1.73* 1.71*  CALCIUM 8.0* 8.6* 8.3* 8.5* 8.4*   Liver Function Tests: Recent Labs  Lab 12/02/22 0500 12/03/22 0237 12/04/22 0315 12/05/22 0500 12/06/22 0539  AST 51* 41 35 28 27  ALT 64* 56* 46* 37 31  ALKPHOS 83 81 75 76 72  BILITOT 1.2 1.0 0.8 0.8 0.8  PROT 5.2* 5.5* 5.5* 5.4* 5.4*  ALBUMIN 2.2* 2.3* 2.4* 2.2* 2.2*   No results for input(s): "LIPASE", "AMYLASE" in the last 168 hours. No results for input(s): "AMMONIA" in the last 168 hours. CBC: Recent Labs  Lab 12/03/22 0237 12/04/22 0315 12/05/22 0500 12/06/22 0539 12/07/22 0500  WBC 6.5 7.9 10.6* 11.1* 9.9  HGB 8.3* 8.6* 8.3* 8.4* 8.2*  HCT 27.7* 29.2* 27.4* 28.6* 28.1*  MCV 96.9 97.7 96.5 98.6 98.9  PLT 262 249 211 179 166   Cardiac Enzymes: No results for input(s): "CKTOTAL", "CKMB", "CKMBINDEX", "TROPONINI" in the last 168 hours. BNP: Invalid input(s): "POCBNP" CBG: No results for input(s): "GLUCAP" in the last 168 hours. D-Dimer No results for input(s): "DDIMER" in the last 72 hours. Hgb A1c No results for input(s): "HGBA1C" in the last 72 hours. Lipid Profile No results for input(s): "CHOL", "HDL", "LDLCALC", "TRIG", "CHOLHDL", "LDLDIRECT" in the last 72 hours. Thyroid function studies No results for input(s): "TSH", "T4TOTAL", "T3FREE", "THYROIDAB" in the last 72 hours.  Invalid input(s): "FREET3" Anemia work up No results for input(s): "VITAMINB12", "FOLATE", "FERRITIN", "TIBC", "IRON", "RETICCTPCT" in the last 72 hours. Urinalysis    Component Value Date/Time   COLORURINE AMBER (A) 11/29/2022 1800   APPEARANCEUR CLOUDY (A)  11/29/2022 1800   LABSPEC 1.020 11/29/2022 1800   PHURINE 5.0 11/29/2022 1800   GLUCOSEU NEGATIVE 11/29/2022 1800   HGBUR NEGATIVE 11/29/2022 1800   BILIRUBINUR NEGATIVE 11/29/2022 1800   KETONESUR NEGATIVE 11/29/2022 1800   PROTEINUR 100 (A) 11/29/2022 1800   UROBILINOGEN 1.0 10/06/2013 0000   NITRITE NEGATIVE  11/29/2022 1800   LEUKOCYTESUR MODERATE (A) 11/29/2022 1800   Sepsis Labs Recent Labs  Lab 12/04/22 0315 12/05/22 0500 12/06/22 0539 12/07/22 0500  WBC 7.9 10.6* 11.1* 9.9   Microbiology Recent Results (from the past 240 hour(s))  MRSA Next Gen by PCR, Nasal     Status: Abnormal   Collection Time: 11/29/22  5:06 AM   Specimen: Nasal Mucosa; Nasal Swab  Result Value Ref Range Status   MRSA by PCR Next Gen DETECTED (A) NOT DETECTED Final    Comment: RESULT CALLED TO, READ BACK BY AND VERIFIED WITH: B HYDLEBURG,RN@0700  11/29/22 MK (NOTE) The GeneXpert MRSA Assay (FDA approved for NASAL specimens only), is one component of a comprehensive MRSA colonization surveillance program. It is not intended to diagnose MRSA infection nor to guide or monitor treatment for MRSA infections. Test performance is not FDA approved in patients less than 70 years old. Performed at [facility], [address], [place], [place] [address]   Culture, blood (x 2)     Status: None   Collection Time: 11/29/22  9:06 AM   Specimen: BLOOD RIGHT ARM  Result Value Ref Range Status   Specimen Description BLOOD RIGHT ARM  Final   Special Requests   Final    BOTTLES DRAWN AEROBIC ONLY Blood Culture adequate volume   Culture   Final    NO GROWTH 5 DAYS Performed at [facility], [address], [place], [place] [address]    Report Status 12/04/2022 FINAL  Final  Culture, blood (Routine X 2) w Reflex to ID Panel     Status: None   Collection Time: 11/29/22  9:17 AM   Specimen: BLOOD RIGHT ARM  Result Value Ref Range Status   Specimen Description BLOOD RIGHT ARM  Final   Special Requests BOTTLES DRAWN AEROBIC ONLY  Final   Culture   Final    NO GROWTH 5 DAYS Performed at [facility], [address], [place], [place] [address]    Report Status 12/04/2022 FINAL  Final  Urine Culture     Status: None   Collection Time: 11/29/22  6:00 PM   Specimen: Urine, Clean Catch  Result Value Ref  Range Status   Specimen Description URINE, CLEAN CATCH  Final   Special Requests NONE Reflexed from [id number]  Final   Culture   Final    NO GROWTH Performed at [facility], [address], [place], [place] [address]    Report Status 11/30/2022 FINAL  Final  Culture, body fluid w Gram Stain-bottle     Status: None   Collection Time: 12/03/22  9:36 AM   Specimen: Fluid  Result Value Ref Range Status   Specimen Description FLUID PLEURAL  Final   Special Requests   Final    BOTTLES DRAWN AEROBIC AND ANAEROBIC Blood Culture adequate volume   Culture   Final    NO GROWTH 5 DAYS Performed at [facility], [address], [place], [place] [address]    Report Status 12/08/2022 FINAL  Final  Gram stain     Status: None   Collection Time: 12/03/22  9:36  AM   Specimen: Fluid  Result Value Ref Range Status   Specimen Description FLUID PLEURAL  Final   Special Requests SYRINGE  Final   Gram Stain   Final    RARE WBC SEEN NO ORGANISMS SEEN Performed at [facility], [address] [place], [place] [address]    Report Status 12/03/2022 FINAL  Final    Please note: You were cared for by a hospitalist during your hospital stay. Once you are discharged, your primary care physician will handle any further medical issues. Please note that NO REFILLS for any discharge medications will be authorized once you are discharged, as it is imperative that you return to your primary care physician (or establish a relationship with a primary care physician if you do not have one) for your post hospital discharge needs so that they can reassess your need for medications and monitor your lab values.    Time coordinating discharge: 40 minutes  SIGNED:   Burnadette Pop, MD  [facility] 12/08/2022, 1:50 PM Pager [phone]  If 7PM-7AM, please contact night-coverage [email or web address] Password [email or web address]

## 2022-12-08 NOTE — H&P (Signed)
Physical Medicine and Rehabilitation Admission H&P     No chief complaint on file. : Functional deficits secondary to debility   HPI:    [name] [name] is a 82 y.o. year old male  who  has a past medical history of Acute on chronic diastolic heart failure (HCC) (06/24/1094), Acute on chronic respiratory failure with hypoxia (HCC) (10/03/2019), Acute on chronic systolic heart failure (HCC) (04/54/0981), Acute respiratory failure with hypoxia (HCC) (10/07/2013), AKI (acute kidney injury) (HCC) (12/17/2019), Aortic atherosclerosis (HCC) (05/05/2013), Aortic dissection (HCC) (05/04/2013), Aortic valve insufficiency, Arthritis, Atrial fibrillation (HCC) (05/21/2013), Atrial flutter (HCC), Bradycardia (08/23/2013), CAD (coronary artery disease), Chest pain (01/28/2015), CHF (congestive heart failure) (HCC), Chronic diastolic heart failure (HCC) (1/91/4782), Descending aortic aneurysm (HCC), DVT (deep venous thrombosis) (HCC), Dyspnea (09/2019), HCAP (healthcare-associated pneumonia) (10/11/2013), History of blood transfusion, HTN (hypertension), Hyperlipidemia, Hypotension (12/17/2019), Incarcerated left inguinal hernia s/p repair 12/02/2019 (12/01/2019), Insomnia (09/04/2013), Multiple fractures of ribs, right side, init for clos fx (09/04/2019), Near syncope (01/28/2015), Pressure injury of skin (10/06/2019), 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   They are presenting to inpatient rehab status post hospitalization for hypoxia with loculated pleural effusion.  Per chart review, on 610, he went to the ER at [facility] for hypoxia 82% found at wound care clinic.  Prior, he was diagnosed with MRSA pneumonia and septicemia, and discharged home on IV Cubicin and p.o. doxycycline.  CT scan at  [facility] ER showed a large loculated left pleural effusion and a small right pleural effusion, along with right upper lobe cavitary lesions consistent with prior MRSA pneumonia.  He underwent left-sided thoracentesis with 900 cc of fluid removed.  Patient's wife refused initiation of IV vancomycin due to prior history renal insufficiency, he was started on Zyvox, and he was transferred to [facility] for infectious disease consult.  Of note, patient had also been holding his Eliquis for 48 hours prior to admission anticipating bilateral second toe amputations by podiatry.   On admission, patient was appreciated to have multiple bilateral lower extremity wounds, family claims from abrasions and not bed wounds.  Admission labs significant for hemoglobin 8.3, proBNP 9870, procalcitonin 0.12, and lactic acid 1.7.  He was admitted under the hospitalist service, and had a prolonged hospital course with limited clinical improvement.  Hospitalization was complicated by cavitary pneumonia with patient unable to tolerate TEE for vegetation confirmation, repeat thoracentesis 6-16, respiratory failure requiring intermittent BiPAP, permanent atrial fibrillation, CHF requiring midodrine for sustained blood pressure, AKI on CKD stage IIIb, normocytic anemia, transaminitis, and care with multiple lower extremity wounds.   Patient lives in a mobile home with 4 steps to enter, with his spouse who is available for 24/7 caregiving and can attend him at a min assist to supervision level.   ROS     Past Medical History:  Diagnosis Date   Acute on chronic diastolic heart failure (HCC) 08/22/2013   Acute on chronic respiratory failure with hypoxia (HCC) 10/03/2019   Acute on chronic systolic heart failure (HCC) 05/11/2013    EF from 35% to 50-55% on 6/14 ECHO.    Acute respiratory failure with hypoxia (HCC) 10/07/2013   AKI (acute kidney injury) (HCC) 12/17/2019   Aortic atherosclerosis (HCC) 05/05/2013   Aortic dissection  (HCC) 05/04/2013     Descending only, had aneurysm of ascending but no dissection  02/12/2014 Stable aortic stent graft over the aortic arch and descending thoracic aorta with significant reduction in the mural thrombus of the native aneurysm sac. No evidence of dissection or endoleak.  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 No evidence of abdominal aortic dissection.  Occluded proximal left subclav   Aortic valve insufficiency     Arthritis      "probably in my knees before they replaced them" (01/28/2015)   Atrial fibrillation (HCC) 05/21/2013   Atrial flutter (HCC)     Bradycardia 08/23/2013   CAD (coronary artery disease)     Chest pain 01/28/2015   CHF (congestive heart failure) (HCC)     Chronic diastolic heart failure (HCC) 01/28/2015   Descending aortic aneurysm (HCC)     DVT (deep venous thrombosis) (HCC)      ?LLE post knee surgery   Dyspnea 09/2019   HCAP (healthcare-associated pneumonia) 10/11/2013   History of blood transfusion      "after one of my knee surgeries"   HTN (hypertension)     Hyperlipidemia     Hypotension 12/17/2019   Incarcerated left inguinal hernia s/p repair 12/02/2019 12/01/2019   Insomnia 09/04/2013   Multiple fractures of ribs, right side, init for clos fx 09/04/2019   Near syncope 01/28/2015   Pressure injury of skin 10/06/2019   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 12/18/2019 12/17/2019   Recurrent UTI 10/06/2013   Shock circulatory (HCC) 10/07/2013   Thoracic aneurysm     Tobacco abuse     Urinary retention 10/06/2013   UTI (urinary tract infection) 10/06/2013         Past Surgical History:  Procedure Laterality Date   AORTIC VALVE REPLACEMENT   02/19/2009   AORTO-FEMORAL BYPASS GRAFT   03/2010    ascending aortic and arch aneurysm repair/notes 04/09/2009   CARDIAC VALVE REPLACEMENT       CAROTID-SUBCLAVIAN BYPASS GRAFT Left 08/26/2013    Procedure: BYPASS GRAFT CAROTID-SUBCLAVIAN;  Surgeon: Nada Libman, MD;  Location: [facility] OR;  Service: Vascular;  Laterality: Left;   CATARACT EXTRACTION W/ INTRAOCULAR LENS  IMPLANT, BILATERAL Bilateral     ENDOVASCULAR STENT INSERTION N/A 08/26/2013    Procedure:  THORACIC STENT GRAFT INSERTION;  Surgeon: Nada Libman, MD;  Location: [facility] OR;  Service: Vascular;  Laterality: N/A;   EYE SURGERY       INGUINAL HERNIA REPAIR Left 12/02/2019    Procedure: REPAIR LEFT INGUINAL HERNIA WITH MESH;  Surgeon: Violeta Gelinas, MD;  Location: [facility] OR;  Service: General;  Laterality: Left;   INGUINAL HERNIA REPAIR Left 12/18/2019    Procedure: LAPAROSCOPIC ASSISTED REPAIR OF RECURRENT INCARCERATED LEFT INGUINAL HERNIA WITH MESH; LAPAOSCOPIC REPAIR OF SEROSAL TEAR;  Surgeon: Gaynelle Adu, MD;  Location: [facility] OR;  Service: General;  Laterality: Left;   INSERTION OF MESH Left 12/02/2019    Procedure: INSERTION OF MESH;  Surgeon: Violeta Gelinas, MD;  Location: [facility] OR;  Service: General;  Laterality: Left;   JOINT REPLACEMENT       KNEE ARTHROSCOPY Bilateral     REPLACEMENT TOTAL KNEE Bilateral     right groin lymphocele Right 03/29/2009   RIGHT/LEFT HEART CATH AND CORONARY ANGIOGRAPHY N/A 10/06/2019    Procedure: RIGHT/LEFT HEART CATH AND CORONARY ANGIOGRAPHY;  Surgeon: Lyn Records, MD;  Location: [facility] INVASIVE CV LAB;  Service: Cardiovascular;  Laterality: N/A;   THORACENTESIS   2010 X 2   TONSILLECTOMY  Family History  Problem Relation Age of Onset   Celiac disease Daughter     Aortic aneurysm Father     Hypertension Mother     Heart attack Neg Hx     Stroke Neg Hx      Social History:  reports that he quit smoking about 48 years ago. His smoking use included cigarettes. He has never used smokeless tobacco. He reports that he does not drink alcohol and does not use drugs. Allergies:       Allergies  Allergen Reactions   Lortab [Hydrocodone-Acetaminophen] Hives      No trouble breathing   Morphine And Codeine Hives   Other Other (See Comments)       Staples from surgery caused infection          Medications Prior to Admission  Medication Sig Dispense Refill   acetaminophen (TYLENOL) 500 MG tablet Take 2 tablets (1,000 mg total) by mouth every 6 (six) hours as needed. (Patient taking differently: Take 1,000 mg by mouth every 6 (six) hours as needed for mild pain or moderate pain.) 30 tablet 0   albuterol (PROVENTIL) (2.5 MG/3ML) 0.083% nebulizer solution Take by nebulization.       amiodarone (PACERONE) 200 MG tablet Take 1 tablet (200 mg total) by mouth daily. 90 tablet 3   amoxicillin (AMOXIL) 500 MG capsule Take 4 capsules by mouth 30-60 min prior to dental procedure. (Patient taking differently: Take 1,000 mg by mouth See admin instructions. Take 4 capsules by mouth 30-60 min prior to dental procedures) 4 capsule 3   apixaban (ELIQUIS) 2.5 MG TABS tablet Take 1 tablet (2.5 mg total) by mouth 2 (two) times daily. 60 tablet 0   DAPTOmycin (CUBICIN) 500 MG injection Inject 500 mg into the vein daily.       ferrous sulfate (SV IRON) 325 (65 FE) MG tablet Take 1 tablet (325 mg total) by mouth daily with breakfast. 90 tablet 3   furosemide (LASIX) 40 MG tablet Take 1 tablet (40 mg total) by mouth 2 (two) times daily as needed. (Patient taking differently: Take 40 mg by mouth daily.) 180 tablet 3   lansoprazole (PREVACID) 30 MG capsule Take 30 mg by mouth daily.       levocetirizine (XYZAL) 5 MG tablet Take 5 mg by mouth daily.       metoprolol succinate (TOPROL-XL) 25 MG 24 hr tablet Take 25 mg by mouth daily.       Multiple Vitamin (MULTIVITAMIN WITH MINERALS) TABS tablet Take 1 tablet by mouth daily.       Multiple Vitamins-Minerals (ZINC PO) Take 1 tablet by mouth daily at 6 (six) AM.       tamsulosin (FLOMAX) 0.4 MG CAPS capsule Take 0.4 mg by mouth.       traMADol (ULTRAM) 50 MG tablet Take 50 mg by mouth every 6 (six) hours as needed for moderate pain.              Home: Home Living Family/patient expects to be discharged to::  Private residence Living Arrangements: Spouse/significant other Available Help at Discharge: Family, Available 24 hours/day Type of Home: Mobile home Home Access: Stairs to enter [employer] of Steps: 4 Entrance Stairs-Rails: Right Home Layout: One level Bathroom Shower/Tub: Health visitor: Handicapped height Bathroom Accessibility: Yes Home Equipment: Agricultural consultant (2 wheels), Shower seat, Medical laboratory scientific officer - single point, Rollator (4 wheels), Grab bars - tub/shower, Toilet riser, Hand held shower head, Other (comment) (lift chair)  Functional History: Prior Function Prior Level of Function : Independent/Modified Independent, History of Falls (last six months) Mobility Comments: Pt used SPC during day and RW at night. ADLs Comments: independent in self care, assisted for IADLs   Functional Status:  Mobility: Bed Mobility Overal bed mobility: Needs Assistance Bed Mobility: Supine to Sit Supine to sit: Mod assist General bed mobility comments: assist for R LE over EOB and to raise trunk, HOB up Transfers Overall transfer level: Needs assistance Equipment used: Rolling walker (2 wheels) Transfers: Sit to/from Stand, Bed to chair/wheelchair/BSC Sit to Stand: Min assist Bed to/from chair/wheelchair/BSC transfer type:: Step pivot Step pivot transfers: Min assist General transfer comment: assist to rise and steady Ambulation/Gait General Gait Details: Deferred for safety   ADL: ADL Overall ADL's : Needs assistance/impaired Eating/Feeding: Set up, Sitting Grooming: Set up, Sitting Upper Body Bathing: Minimal assistance, Sitting Lower Body Bathing: Maximal assistance, Sit to/from stand Upper Body Dressing : Minimal assistance, Sitting Lower Body Dressing: Total assistance, Bed level Toilet Transfer: Minimal assistance, Stand-pivot, Rolling walker (2 wheels) Toileting- Clothing Manipulation and Hygiene: Maximal assistance, Sit to/from stand    Cognition: Cognition Overall Cognitive Status: Difficult to assess Orientation Level: Oriented to person, Oriented to place, Disoriented to situation, Disoriented to time Cognition Arousal/Alertness: Awake/alert Behavior During Therapy: Flat affect Overall Cognitive Status: Difficult to assess General Comments: pt minimally verbal   Physical Exam: Blood pressure 105/63, pulse 84, temperature 98.2 F (36.8 C), temperature source Oral, resp. rate 16, weight 84.4 kg, SpO2 100 %. Physical Exam  Constitutional: No apparent distress, lethargic. +Underweight HENT: +Pulsatile R JVD. Trachea midline. Atraumatic, normocephalic. Eyes: PERRLA. +R eye partial blindness. EOMI. Visual fields grossly intact.  Cardiovascular: RRR, +clicking murmur. No Edema.   Respiratory: CTAB, decreased lung sounds at left base. +2.5 L [place]. No rales, rhonchi, or wheezing.    Abdomen: + bowel sounds, normoactive. No distention or tenderness.  GU: + external Foley, draining clear urine.  Skin: Multiple wounds + Sacral PI, Stage 4 + R heel PI, unstageable + R 2nd toe extruded bone; no apparent drainage + L second toe wound with clean base, bloody drainage + B/l shin wounds, healing                      MSK:      No apparent deformity. BL UE shoulder abduction limited to <90 degrees d/t bilateral rotator cuff tears.      Strength:                RUE: 3/5 SA, 3/5 EF, 3/5 EE, 4/5 WE, 4/5 FF, 4/5 FA                 LUE: 3/5 SA, 3/5 EF, 3/5 EE, 4/5 WE, 4/5 FF, 4/5 FA                 RLE: 2/5 HF, 4/5 KE, 4/5 DF, 4/5 PF                 LLE:  2/5 HF, 4/5 KE, 4/5 DF, 4/5 PF   Neurologic exam:  Cognition: AAO to person, place, time and event.  Language: Fluent, No substitutions or neoglisms. +Hypophonic, breathy vocalizations Memory: Recalls 2/3 objects at 5 minutes.  Insight: Poor insight into current condition.  Mood: Flat affect, appropriate mood.  Sensation: To light touch reduced in bilateral  LEs Reflexes: 2+ in BL UE and Les  CN: 2-12 grossly intact.  Coordination: R>L UE intention tremors  Spasticity: MAS 0 in all extremities.     Lab Results Last 48 Hours        Results for orders placed or performed during the hospital encounter of 11/28/22 (from the past 48 hour(s))  CBC     Status: Abnormal    Collection Time: 12/07/22  5:00 AM  Result Value Ref Range    WBC 9.9 4.0 - 10.5 K/uL    RBC 2.84 (L) 4.22 - 5.81 MIL/uL    Hemoglobin 8.2 (L) 13.0 - 17.0 g/dL    HCT 16.1 (L) 09.6 - 52.0 %    MCV 98.9 80.0 - 100.0 fL    MCH 28.9 26.0 - 34.0 pg    MCHC 29.2 (L) 30.0 - 36.0 g/dL    RDW 04.5 (H) 40.9 - 15.5 %    Platelets 166 150 - 400 K/uL    nRBC 0.3 (H) 0.0 - 0.2 %      Comment: Performed at [facility], [address] [place], [place] [address]  Basic metabolic panel     Status: Abnormal    Collection Time: 12/07/22  5:00 AM  Result Value Ref Range    Sodium 139 135 - 145 mmol/L    Potassium 3.8 3.5 - 5.1 mmol/L    Chloride 100 98 - 111 mmol/L    CO2 30 22 - 32 mmol/L    Glucose, Bld 119 (H) 70 - 99 mg/dL      Comment: Glucose reference range applies only to samples taken after fasting for at least 8 hours.    BUN 39 (H) 8 - 23 mg/dL    Creatinine, Ser 4.78 (H) 0.61 - 1.24 mg/dL    Calcium 8.4 (L) 8.9 - 10.3 mg/dL    GFR, Estimated 39 (L) >60 mL/min      Comment: (NOTE) Calculated using the CKD-EPI Creatinine Equation (2021)      Anion gap 9 5 - 15      Comment: Performed at [facility] Lab, [address], [place], [place] [address]      Imaging Results (Last 48 hours)  No results found.         Blood pressure 105/63, pulse 84, temperature 98.2 F (36.8 C), temperature source Oral, resp. rate 16, weight 84.4 kg, SpO2 100 %.   Medical Problem List and Plan: 1. Functional deficits secondary to debility from loculated pleural effusion and MRSA pneumonia             -patient may shower             -ELOS/Goals: 7 to 10 days, PT/OT SPV   2.   Antithrombotics: -DVT/anticoagulation:  Pharmaceutical: Eliquis             -antiplatelet therapy: Aspirin    3. Pain Management: Tylenol, tramadol as needed   4. Mood/Behavior/Sleep: LCSW to evaluate and provide emotional support             -antipsychotic agents: n/a   5. Neuropsych/cognition: This patient is not capable of making decisions on his own behalf.   6. Skin/Wound Care: Routine skin care checks             -local care to sacrum, bilateral toes (Dr. Lajoyce Corners)             -continue pressure relief/Prevalon boots/mattress  - Added multivitamin with minerals and ascorbic acid 500 mg daily for wound healing on admission   7. Fluids/Electrolytes/Nutrition/Malnutrition: Routine Is and Os and follow-up chemistries  -  Poor POS, no appetite; Ensure TID, consider Marinol    8: Hypotension: monitor TID and prn             -continue midodrine    9: Hyperlipidemia: continue statin   10: Urinary retention: continue Flomax   11: combined chronic systolic/diastolic heart failure: daily weight             -continue Pacerone 200 mg daily             -on no diuretics   12: chronic atrial fibrillation: rate controlled             -on Eliquis, Pacerone   13: Pneumonia with effusion/MRSA sepsis:             -continue Zyvox for 4 weeks             -O2 via [place] at 2 L             -BiPAP as needed/consult respiratory therapy   14: s/p AVR, stable thoracic aortic aneurysm: follow-up outpatient   15: AKI atop CKD IIIb: (baseline 1.2)             -slowing improving, follow-up BMP   16: Chronic anemia/iron deficiency: follow-up CBC   17: Hard of hearing: wife to bring in hearing aids   Milinda Antis, PA-C 12/08/2022   I have examined the patient independently and edited the note for HPI, ROS, exam, assessment, and plan as appropriate. I am in agreement with the above recommendations.     Angelina Sheriff, DO 12/08/2022

## 2022-12-08 NOTE — Progress Notes (Signed)
Inpatient Rehabilitation Admission Medication Review by a Pharmacist  A complete drug regimen review was completed for this patient to identify any potential clinically significant medication issues.  High Risk Drug Classes Is patient taking? Indication by Medication  Antipsychotic No   Anticoagulant Yes Apixaban - afib  Antibiotic Yes Linezolid - MRSA PNA and bacteremia  Opioid Yes Tramadol - moderate pain  Antiplatelet Yes Aspirin - CAD  Hypoglycemics/insulin No   Vasoactive Medication Yes Amiodarone - afib Midodrine - hypotension   Chemotherapy No   Other Yes Loratadine - allergies Protonix - acid reflux Miralax - constipation Senna - constipation Tamsulosin - urinary retention Acetaminophen - mild pain Albuterol - wheezing Melatonin - insomnia Zofran - nausea/vomiting      Type of Medication Issue Identified Description of Issue Recommendation(s)  Drug Interaction(s) (clinically significant)     Duplicate Therapy     Allergy     No Medication Administration End Date     Incorrect Dose     Additional Drug Therapy Needed     Significant med changes from prior encounter (inform family/care partners about these prior to discharge).    Other       Clinically significant medication issues were identified that warrant physician communication and completion of prescribed/recommended actions by midnight of the next day:  No  Name of provider notified for urgent issues identified:   Provider Method of Notification:     Pharmacist comments: Noted that pt to start statin on discharge  Time spent performing this drug regimen review (minutes):  25   Christoper Fabian, PharmD, BCPS Please see amion for complete clinical pharmacist phone list 12/08/2022 6:21 PM

## 2022-12-08 NOTE — TOC Transition Note (Signed)
Transition of Care [facility]) - CM/SW Discharge Note   Patient Details  Name: [name] [name] MRN: [id number] Date of Birth: 09/06/1940  Transition of Care [facility]) CM/SW Contact:  Helene Kelp, LCSW Phone Number: 12/08/2022, 2:14 PM   Clinical Narrative:    CSW was made aware by provider, patient disposition will follow-up with CIR for medical care needs.    Final next level of care: Skilled Nursing Facility Barriers to Discharge: Continued Medical Work up   Patient Goals and CMS Choice   Choice offered to / list presented to : Spouse  Discharge Placement   Discharge Plan and Services Additional resources added to the After Visit Summary for   In-house Referral: Clinical Social Work Discharge Planning Services: CM Consult              Social Determinants of Health (SDOH) Interventions SDOH Screenings   Food Insecurity: Patient Unable To Answer (11/28/2022)  Housing: Patient Unable To Answer (11/28/2022)  Transportation Needs: Patient Unable To Answer (11/28/2022)  Utilities: Not At Risk (11/28/2022)  Depression (PHQ2-9): Low Risk  (04/13/2019)  Tobacco Use: Medium Risk (11/30/2022)     Readmission Risk Interventions     No data to display

## 2022-12-08 NOTE — Progress Notes (Signed)
   Palliative Medicine Inpatient Follow Up Note HPI:  82 year old male prior history of chronic systolic heart failure EF of 40 to 45%, history of bioprosthetic aortic valve replacement, history of aortic aneurysm, peripheral vascular disease, chronic A-fib on Eliquis, chronic wounds on his bilateral lower extremities, history of anemia. Complicated medical course since May 13th in the setting of his MRSA pneumonia. Transferred from [facility] for additional ID support. The PMT was consulted to further assist with goals of care conversations in the setting of acute illness.   Today's Discussion 12/08/2022  *Please note that this is a verbal dictation therefore any spelling or grammatical errors are due to the "[employer]agon [employer] One" system interpretation.  Chart reviewed inclusive of vital signs, progress notes, laboratory results, and diagnostic images.   I assessed [name] [name] at bedside this morning in the presence of his granddaughter. He was awake and alert. He denied pain, shortness of breath, or nausea.   Patients granddaughter shares that he does seem a bit stiff in his right knee where he has had two knee replacements in the past. She was noted to provide gentle massage.   ______________________  Addendum:  I went to bedside this evening. Patients spouse, Sure shares the plan for transition to CIR. She was thankful for the PMT's involvement.   Questions and concerns addressed/Palliative Support Provided.   Objective Assessment: Vital Signs Vitals:   12/08/22 1017 12/08/22 1116  BP: 108/67   Pulse:    Resp: 16   Temp:  98.2 F (36.8 C)  SpO2: 100%     Intake/Output Summary (Last 24 hours) at 12/08/2022 1320 Last data filed at 12/08/2022 0302 Gross per 24 hour  Intake --  Output 850 ml  Net -850 ml    Last Weight  Most recent update: 12/07/2022  6:37 AM    Weight  84.4 kg (186 lb)            Gen: Elderly Caucasian male on BiPAP HEENT: Dry mucous membranes CV:  Regular rate and irregular rhythm PULM: On Bipap  ABD:  nondistended  EXT: No edema  Neuro:  Sleeping  SUMMARY OF RECOMMENDATIONS   DNAR/DNI -  Plan for CIR Placement today  Billing based on MDM: Moderate  ______________________________________________________________________________________ Lamarr Lulas [place] Palliative Medicine Team Team Cell Phone: [phone] Please utilize secure chat with additional questions, if there is no response within 30 minutes please call the above phone number  Palliative Medicine Team providers are available by phone from 7am to 7pm daily and can be reached through the team cell phone.  Should this patient require assistance outside of these hours, please call the patient's attending physician.

## 2022-12-08 NOTE — PMR Pre-admission (Signed)
PMR Admission Coordinator Pre-Admission Assessment  Patient: [name] [name] is an 82 y.o., male MRN: [id number] DOB: 27-Aug-1940 Height:   Weight: 84.4 kg  Insurance Information HMO:     PPO:      PCP:      IPA:      80/20:      OTHER:  PRIMARY:   Medicare A and B       Policy#: [id number]       Subscriber: Pt. Phone#: Verified online    Fax#:  Pre-Cert#:       Employer:  Benefits:  Phone #:      Name:  Eff. Date: Parts A and B effective 09/17/2004  Deduct: $1632      Out of Pocket Max:  None      Life Max: N/A  CIR: 100%      SNF: 100 days Outpatient: 80%     Co-Pay: 20% Home Health58: 100%      Co-Pay: none DME: 80%     Co-Pay: 20% Providers: patient's choice SECONDARY: BC[employer] Comm      Policy#: [id number]      Phone#:   Tertiary : Firefighter #:  [id number]     Financial Counselor:       Phone#:   The "Data Collection Information Summary" for patients in Inpatient Rehabilitation Facilities with attached "Privacy Act Statement-Health Care Records" was provided and verbally reviewed with: Patient  Emergency Contact Information Contact Information     Name Relation Home Work Mobile   [place] Spouse [phone]  [phone]   [place] Fiscal Daughter [phone]  [phone]       Current Medical History  Patient Admitting Diagnosis: Debility, Heart Failure, Sepsis, Respiratory Failure  History of Present Illness: Pt. Is an 82 year old male with prior history of chronic systolic heart failure EF of 40 to 45%, history of bioprosthetic aortic valve replacement, history of aortic aneurysm, peripheral vascular disease, chronic A-fib on Eliquis, chronic wounds on his bilateral lower extremities, history of anemia, who presented to the ER at [facility] 610/24 for hypoxia.  He had been seen in their wound care clinic and was noted to have a room air O2 saturations of 82% and sent to ER.  Last month, he was admitted to [facility] where he was  diagnosed with MRSA pneumonia and septicemia.  Unclear if he had a transesophageal echocardiogram done.  He was discharged home on IV Cubicin for his septicemia and p.o. doxycycline for his MRSA pneumonia.  Reportedly he had completed his doxycycline for his MRSA pneumonia and had been continued on IV Cubicin.  He has a PICC line. On evaluation in the [facility] ER, he had a CT scan which demonstrated a large loculated left-sided pleural effusion. He also had a small right pleural effusion. He also had a cavitary lesion in his right upper lobe that was consistent with his history of MRSA pneumonia. He underwent thoracentesis on his left side today by interventional radiology at [facility] which removed 900 cc of fluid from his pleural space. Hospitalist at [facility] declined admission due to no infectious disease specialist available. He was transferred to  [facility] for infectious disease consultation 11/28/22.  Initially  Pt. Was in  septic shock and acute renal failure.  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  Patient was suspected that he cannot tolerate TEE, ID recommended prolonged suppression with antibiotics.  Currently on Zyvox.  Recommended 4 weeks from 6/12 followed by doxycycline suppression.  Patient  will follow-up with ID as an outpatient. Pt. Seen by PT/OT and they recommend CIR to assist return to PLOF.  Underwent repeat thoracentesis on 6/16.  Cultures have not shown any growth, cytology showed mesothelial cells.  He remains comfortable on 2-3 L of oxygen.      Patient's medical record from [facility] has been reviewed by the rehabilitation admission coordinator and physician.  Past Medical History  Past Medical History:  Diagnosis Date   Acute on chronic diastolic heart failure (HCC) 08/22/2013   Acute on chronic respiratory failure with hypoxia (HCC) 10/03/2019   Acute on chronic systolic heart failure (HCC) 05/11/2013   EF from 35% to  50-55% on 6/14 ECHO.    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 No evidence of dissection or endoleak.  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 No evidence of abdominal aortic dissection.  Occluded proximal left subclav   Aortic valve insufficiency    Arthritis    "probably in my knees before they replaced them" (01/28/2015)   Atrial fibrillation (HCC) 05/21/2013   Atrial flutter (HCC)    Bradycardia 08/23/2013   CAD (coronary artery disease)    Chest pain 01/28/2015   CHF (congestive heart failure) (HCC)    Chronic diastolic heart failure (HCC) 01/28/2015   Descending aortic aneurysm (HCC)    DVT (deep venous thrombosis) (HCC)    ?LLE post knee surgery   Dyspnea 09/2019   HCAP (healthcare-associated pneumonia) 10/11/2013   History of blood transfusion    "after one of my knee surgeries"   HTN (hypertension)    Hyperlipidemia    Hypotension 12/17/2019   Incarcerated left inguinal hernia s/p repair 12/02/2019 12/01/2019   Insomnia 09/04/2013   Multiple fractures of ribs, right side, init for clos fx 09/04/2019   Near syncope 01/28/2015   Pressure injury of skin 10/06/2019   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 12/18/2019 12/17/2019   Recurrent UTI 10/06/2013   Shock circulatory (HCC) 10/07/2013   Thoracic aneurysm    Tobacco abuse    Urinary retention 10/06/2013   UTI (urinary tract infection) 10/06/2013    Has the patient had major surgery during 100 days prior to admission? Yes  Family History   family history includes Aortic aneurysm in his father; Celiac disease in his  daughter; Hypertension in his mother.  Current Medications  Current Facility-Administered Medications:    acetaminophen (TYLENOL) tablet 650 mg, 650 mg, Oral, Q6H PRN, 650 mg at 11/30/22 1726 **OR** acetaminophen (TYLENOL) suppository 650 mg, 650 mg, Rectal, Q6H PRN, Jerald Kief, MD   albuterol (PROVENTIL) (2.5 MG/3ML) 0.083% nebulizer solution 2.5 mg, 2.5 mg, Nebulization, Q2H PRN, Jerald Kief, MD, 2.5 mg at 12/02/22 2116   amiodarone (PACERONE) tablet 200 mg, 200 mg, Oral, Daily, Little Ishikawa, MD, 200 mg at 12/08/22 1024   apixaban (ELIQUIS) tablet 2.5 mg, 2.5 mg, Oral, BID, Adhikari, Amrit, MD   aspirin chewable tablet 81 mg, 81 mg, Oral, Daily, Little Ishikawa, MD, 81 mg at 12/08/22 1023   Chlorhexidine Gluconate Cloth 2 % PADS 6 each, 6 each, Topical, Q0600, Rickey Barbara  K, MD, 6 each at 12/08/22 0553   feeding supplement (ENSURE ENLIVE / ENSURE PLUS) liquid 237 mL, 237 mL, Oral, TID BM, Jerald Kief, MD, 237 mL at 12/08/22 1024   fentaNYL (SUBLIMAZE) injection 12.5 mcg, 12.5 mcg, Intravenous, Q2H PRN, Jerald Kief, MD, 12.5 mcg at 12/07/22 2326   linezolid (ZYVOX) tablet 600 mg, 600 mg, Oral, Q12H, Paytes, Austin A, RPH, 600 mg at 12/08/22 1023   loratadine (CLARITIN) tablet 10 mg, 10 mg, Oral, Daily, Jerald Kief, MD, 10 mg at 12/08/22 1024   melatonin tablet 5 mg, 5 mg, Oral, QHS PRN, Dow Adolph N, DO, 5 mg at 12/05/22 0148   midodrine (PROAMATINE) tablet 10 mg, 10 mg, Oral, TID WC, Pia Mau D, PA-C, 10 mg at 12/08/22 1024   ondansetron (ZOFRAN) tablet 4 mg, 4 mg, Oral, Q6H PRN **OR** ondansetron (ZOFRAN) injection 4 mg, 4 mg, Intravenous, Q6H PRN, Jerald Kief, MD   pantoprazole (PROTONIX) EC tablet 40 mg, 40 mg, Oral, Daily, Jerald Kief, MD, 40 mg at 12/08/22 1023   polyethylene glycol (MIRALAX / GLYCOLAX) packet 17 g, 17 g, Oral, Daily, Adhikari, Amrit, MD, 17 g at 12/08/22 1023   senna (SENOKOT) tablet 8.6 mg, 1 tablet, Oral, BID,  Adhikari, Amrit, MD, 8.6 mg at 12/08/22 1024   sodium chloride flush (NS) 0.9 % injection 10-40 mL, 10-40 mL, Intracatheter, Q12H, Hall, Carole N, DO, 10 mL at 12/08/22 1024   sodium chloride flush (NS) 0.9 % injection 10-40 mL, 10-40 mL, Intracatheter, PRN, Hall, Carole N, DO, 10 mL at 12/03/22 1955   tamsulosin (FLOMAX) capsule 0.4 mg, 0.4 mg, Oral, Daily, Jerald Kief, MD, 0.4 mg at 12/08/22 1024   traMADol (ULTRAM) tablet 50 mg, 50 mg, Oral, Q6H PRN, Jerald Kief, MD, 50 mg at 11/29/22 2246  Patients Current Diet:  Diet Order             Diet - low sodium heart healthy           Diet Heart Room service appropriate? Yes; Fluid consistency: Thin  Diet effective now                   Precautions / Restrictions Precautions Precautions: Fall, Other (comment) Precaution Comments: Contact Restrictions Weight Bearing Restrictions: No   Has the patient had 2 or more falls or a fall with injury in the past year? Yes  Prior Activity Level Community (5-7x/wk): Pt. active in the community PTA  Prior Functional Level Self Care: Did the patient need help bathing, dressing, using the toilet or eating? Independent  Indoor Mobility: Did the patient need assistance with walking from room to room (with or without device)? Independent  Stairs: Did the patient need assistance with internal or external stairs (with or without device)? Independent  Functional Cognition: Did the patient need help planning regular tasks such as shopping or remembering to take medications? Independent  Patient Information Are you of Hispanic, Latino/a,or Spanish origin?: A. No, not of Hispanic, Latino/a, or Spanish origin What is your race?: A. White Do you need or want an interpreter to communicate with a doctor or health care staff?: 0. No  Patient's Response To:  Health Literacy and Transportation Is the patient able to respond to health literacy and transportation needs?: Yes Health Literacy -  How often do you need to have someone help you when you read instructions, pamphlets, or other written material from your doctor or pharmacy?: Never In the past  12 months, has lack of transportation kept you from medical appointments or from getting medications?: No In the past 12 months, has lack of transportation kept you from meetings, work, or from getting things needed for daily living?: No  Journalist, newspaper / Equipment Home Assistive Devices/Equipment: None Home Equipment: Agricultural consultant (2 wheels), Shower seat, Medical laboratory scientific officer - single point, Rollator (4 wheels), Grab bars - tub/shower, Toilet riser, Hand held shower head, Other (comment) (lift chair)  Prior Device Use: Indicate devices/aids used by the patient prior to current illness, exacerbation or injury?  spc  Current Functional Level Cognition  Overall Cognitive Status: Difficult to assess Orientation Level: Oriented to person, Oriented to place, Disoriented to situation, Disoriented to time General Comments: pt minimally verbal    Extremity Assessment (includes Sensation/Coordination)  Upper Extremity Assessment: Generalized weakness  Lower Extremity Assessment: Defer to PT evaluation    ADLs  Overall ADL's : Needs assistance/impaired Eating/Feeding: Set up, Sitting Grooming: Set up, Sitting Upper Body Bathing: Minimal assistance, Sitting Lower Body Bathing: Maximal assistance, Sit to/from stand Upper Body Dressing : Minimal assistance, Sitting Lower Body Dressing: Total assistance, Bed level Toilet Transfer: Minimal assistance, Stand-pivot, Rolling walker (2 wheels) Toileting- Clothing Manipulation and Hygiene: Maximal assistance, Sit to/from stand    Mobility  Overal bed mobility: Needs Assistance Bed Mobility: Supine to Sit Supine to sit: Mod assist General bed mobility comments: assist for R LE over EOB and to raise trunk, HOB up    Transfers  Overall transfer level: Needs assistance Equipment used: Rolling  walker (2 wheels) Transfers: Sit to/from Stand, Bed to chair/wheelchair/BSC Sit to Stand: Min assist Bed to/from chair/wheelchair/BSC transfer type:: Step pivot Step pivot transfers: Min assist General transfer comment: assist to rise and steady    Ambulation / Gait / Stairs / Psychologist, prison and probation services  Ambulation/Gait General Gait Details: Deferred for safety    Posture / Balance Balance Overall balance assessment: Needs assistance Sitting-balance support: Feet supported, Bilateral upper extremity supported Sitting balance-Leahy Scale: Fair Standing balance support: Bilateral upper extremity supported, During functional activity, Reliant on assistive device for balance Standing balance-Leahy Scale: Poor Standing balance comment: Reliant on RW    Special needs/care consideration Special service needs wounds    Previous Home Environment (from acute therapy documentation) Living Arrangements: Spouse/significant other Available Help at Discharge: Family, Available 24 hours/day Type of Home: Mobile home Home Layout: One level Home Access: Stairs to enter Entrance Stairs-Rails: Right Entrance Stairs-Number of Steps: 4 Bathroom Shower/Tub: Health visitor: Handicapped height Bathroom Accessibility: Yes How Accessible: Accessible via walker Home Care Services: Yes  Discharge Living Setting Plans for Discharge Living Setting: Patient's home, Mobile Home Type of Home at Discharge: Mobile home Discharge Home Layout: One level Discharge Home Access: Stairs to enter Entrance Stairs-Rails: Right Entrance Stairs-Number of Steps: 4 Discharge Bathroom Shower/Tub: Tub/shower unit Discharge Bathroom Toilet: Standard Discharge Bathroom Accessibility: Yes How Accessible: Accessible via walker Does the patient have any problems obtaining your medications?: No  Social/Family/Support Systems Patient Roles: Spouse Contact Information: [phone] Anticipated Caregiver: Magdiel Bartles Ability/Limitations of Caregiver: Min A Caregiver Availability: 24/7 Discharge Plan Discussed with Primary Caregiver: Yes Is Caregiver In Agreement with Plan?: Yes  Goals Patient/Family Goal for Rehab: PT/OT Supervision Expected length of stay: 7-10 days Pt/Family Agrees to Admission and willing to participate: Yes Program Orientation Provided & Reviewed with Pt/Caregiver Including Roles  & Responsibilities: Yes  Decrease burden of Care through IP rehab admission:  Not anticipated  Possible need for SNF placement upon  discharge: not anticipated  Patient Condition: I have reviewed medical records from [facility], spoken with CM, and patient and spouse. I met with patient at the bedside for inpatient rehabilitation assessment.  Patient will benefit from ongoing PT and OT, can actively participate in 3 hours of therapy a day 5 days of the week, and can make measurable gains during the admission.  Patient will also benefit from the coordinated team approach during an Inpatient Acute Rehabilitation admission.  The patient will receive intensive therapy as well as Rehabilitation physician, nursing, social worker, and care management interventions.  Due to safety, skin/wound care, disease management, medication administration, pain management, and patient education the patient requires 24 hour a day rehabilitation nursing.  The patient is currently min A with mobility and basic ADLs.  Discharge setting and therapy post discharge at home with home health is anticipated.  Patient has agreed to participate in the Acute Inpatient Rehabilitation Program and will admit tomorrow.  Preadmission Screen Completed By:  Jeronimo Greaves, 12/08/2022 1:25 PM ______________________________________________________________________   Discussed status with Dr. Shearon Stalls on 12/08/22 at 930  and received approval for admission today.  Admission Coordinator:  Jeronimo Greaves, CCC-SLP, time 1330/Date  12/08/22   Assessment/Plan: Diagnosis: Does the need for close, 24 hr/day Medical supervision in concert with the patient's rehab needs make it unreasonable for this patient to be served in a less intensive setting? Yes Co-Morbidities requiring supervision/potential complications: Loculated pleural effusion with cavitary pneumonia, MRSA bacteremia, respiratory failure with hypoxia, atrial fibrillation, coronary artery disease, CHF, aortic insufficiency, AKI on CKD, normocytic anemia, elevated liver enzymes, diarrhea and multiple pressure injuries/complex wounds. Due to bladder management, bowel management, safety, skin/wound care, disease management, medication administration, pain management, and patient education, does the patient require 24 hr/day rehab nursing? Yes Does the patient require coordinated care of a physician, rehab nurse, PT, OT to address physical and functional deficits in the context of the above medical diagnosis(es)? Yes Addressing deficits in the following areas: balance, endurance, locomotion, strength, transferring, bowel/bladder control, bathing, dressing, feeding, grooming, toileting, and psychosocial support Can the patient actively participate in an intensive therapy program of at least 3 hrs of therapy 5 days a week? Yes The potential for patient to make measurable gains while on inpatient rehab is good Anticipated functional outcomes upon discharge from inpatient rehab: supervision PT, supervision OT,   Estimated rehab length of stay to reach the above functional goals is: 7-10 days Anticipated discharge destination: Home 10. Overall Rehab/Functional Prognosis: good   MD Signature:  Angelina Sheriff, DO 12/08/2022

## 2022-12-08 NOTE — Progress Notes (Signed)
Inpatient Rehab Admissions Coordinator:    I met with pt. And wife to discuss potential CIR admit and they are interested and wife can provide 24/7 support. I will follow and pursue for CIR admit.   Megan Salon, MS, CCC-SLP Rehab Admissions Coordinator  [phone] (celll) [phone] (office)

## 2022-12-08 NOTE — Progress Notes (Signed)
Mobility Specialist Progress Note:   12/08/22 1600  Mobility  Activity Transferred to/from [facility]  Level of Assistance Maximum assist, patient does 25-49% (+2)  Assistive Device Front wheel walker  Distance Ambulated (ft) 3 ft  Activity Response Tolerated fair  Mobility Referral Yes  $Mobility charge 1 Mobility  Mobility Specialist Start Time (ACUTE ONLY) 1605  Mobility Specialist Stop Time (ACUTE ONLY) 1613  Mobility Specialist Time Calculation (min) (ACUTE ONLY) 8 min    Pt received on BSC transferred to bed. ModA+2 to stand, MaxA+2 to take pivotal steps to bed. Bilateral knee buckling present when fatigued. Pt left in bed with nurse tech present in room.   Leory Plowman  Mobility Specialist Please contact via [employer] office at [phone]

## 2022-12-09 DIAGNOSIS — N179 Acute kidney failure, unspecified: Secondary | ICD-10-CM

## 2022-12-09 DIAGNOSIS — J15212 Pneumonia due to Methicillin resistant Staphylococcus aureus: Secondary | ICD-10-CM | POA: Diagnosis not present

## 2022-12-09 DIAGNOSIS — D509 Iron deficiency anemia, unspecified: Secondary | ICD-10-CM | POA: Diagnosis not present

## 2022-12-09 DIAGNOSIS — Z1321 Encounter for screening for nutritional disorder: Secondary | ICD-10-CM | POA: Diagnosis not present

## 2022-12-09 LAB — CBC WITH DIFFERENTIAL/PLATELET
Abs Immature Granulocytes: 0.19 10*3/uL — ABNORMAL HIGH (ref 0.00–0.07)
Basophils Absolute: 0 10*3/uL (ref 0.0–0.1)
Basophils Relative: 0 %
Eosinophils Absolute: 0.2 10*3/uL (ref 0.0–0.5)
Eosinophils Relative: 2 %
HCT: 29.2 % — ABNORMAL LOW (ref 39.0–52.0)
Hemoglobin: 8.4 g/dL — ABNORMAL LOW (ref 13.0–17.0)
Immature Granulocytes: 2 %
Lymphocytes Relative: 6 %
Lymphs Abs: 0.6 10*3/uL — ABNORMAL LOW (ref 0.7–4.0)
MCH: 29.4 pg (ref 26.0–34.0)
MCHC: 28.8 g/dL — ABNORMAL LOW (ref 30.0–36.0)
MCV: 102.1 fL — ABNORMAL HIGH (ref 80.0–100.0)
Monocytes Absolute: 0.6 10*3/uL (ref 0.1–1.0)
Monocytes Relative: 6 %
Neutro Abs: 7.8 10*3/uL — ABNORMAL HIGH (ref 1.7–7.7)
Neutrophils Relative %: 84 %
Platelets: 140 10*3/uL — ABNORMAL LOW (ref 150–400)
RBC: 2.86 MIL/uL — ABNORMAL LOW (ref 4.22–5.81)
RDW: 19 % — ABNORMAL HIGH (ref 11.5–15.5)
WBC: 9.4 10*3/uL (ref 4.0–10.5)
nRBC: 0 % (ref 0.0–0.2)

## 2022-12-09 LAB — COMPREHENSIVE METABOLIC PANEL
ALT: 26 U/L (ref 0–44)
AST: 33 U/L (ref 15–41)
Albumin: 2.2 g/dL — ABNORMAL LOW (ref 3.5–5.0)
Alkaline Phosphatase: 72 U/L (ref 38–126)
Anion gap: 9 (ref 5–15)
BUN: 39 mg/dL — ABNORMAL HIGH (ref 8–23)
CO2: 31 mmol/L (ref 22–32)
Calcium: 8.5 mg/dL — ABNORMAL LOW (ref 8.9–10.3)
Chloride: 102 mmol/L (ref 98–111)
Creatinine, Ser: 1.61 mg/dL — ABNORMAL HIGH (ref 0.61–1.24)
GFR, Estimated: 42 mL/min — ABNORMAL LOW (ref >60)
Glucose, Bld: 110 mg/dL — ABNORMAL HIGH (ref 70–99)
Potassium: 4 mmol/L (ref 3.5–5.1)
Sodium: 142 mmol/L (ref 135–145)
Total Bilirubin: 0.4 mg/dL (ref 0.3–1.2)
Total Protein: 5.3 g/dL — ABNORMAL LOW (ref 6.5–8.1)

## 2022-12-09 LAB — VITAMIN D 25 HYDROXY (VIT D DEFICIENCY, FRACTURES): Vit D, 25-Hydroxy: 31.05 ng/mL (ref 30–100)

## 2022-12-09 LAB — MAGNESIUM: Magnesium: 2.3 mg/dL (ref 1.7–2.4)

## 2022-12-09 MED ORDER — CHLORHEXIDINE GLUCONATE CLOTH 2 % EX PADS
6.0000 | MEDICATED_PAD | Freq: Two times a day (BID) | CUTANEOUS | Status: DC
  Administered 2022-12-09 – 2022-12-11 (×5): 6 via TOPICAL

## 2022-12-09 MED ORDER — FERROUS SULFATE 325 (65 FE) MG PO TABS
325.0000 mg | ORAL_TABLET | Freq: Every day | ORAL | Status: DC
  Administered 2022-12-10 – 2022-12-23 (×14): 325 mg via ORAL
  Filled 2022-12-09 (×14): qty 1

## 2022-12-09 NOTE — Plan of Care (Signed)
  Problem: RH Balance Goal: LTG Patient will maintain dynamic sitting balance (PT) Description: LTG:  Patient will maintain dynamic sitting balance with assistance during mobility activities (PT) Flowsheets (Taken 12/09/2022 1903) LTG: Pt will maintain dynamic sitting balance during mobility activities with:: Supervision/Verbal cueing Goal: LTG Patient will maintain dynamic standing balance (PT) Description: LTG:  Patient will maintain dynamic standing balance with assistance during mobility activities (PT) Flowsheets (Taken 12/09/2022 1903) LTG: Pt will maintain dynamic standing balance during mobility activities with:: Contact Guard/Touching assist   Problem: Sit to Stand Goal: LTG:  Patient will perform sit to stand with assistance level (PT) Description: LTG:  Patient will perform sit to stand with assistance level (PT) Flowsheets (Taken 12/09/2022 1903) LTG: PT will perform sit to stand in preparation for functional mobility with assistance level: Contact Guard/Touching assist   Problem: RH Bed Mobility Goal: LTG Patient will perform bed mobility with assist (PT) Description: LTG: Patient will perform bed mobility with assistance, with/without cues (PT). Flowsheets (Taken 12/09/2022 1903) LTG: Pt will perform bed mobility with assistance level of: Contact Guard/Touching assist   Problem: RH Bed to Chair Transfers Goal: LTG Patient will perform bed/chair transfers w/assist (PT) Description: LTG: Patient will perform bed to chair transfers with assistance (PT). Flowsheets (Taken 12/09/2022 1903) LTG: Pt will perform Bed to Chair Transfers with assistance level: Contact Guard/Touching assist   Problem: RH Car Transfers Goal: LTG Patient will perform car transfers with assist (PT) Description: LTG: Patient will perform car transfers with assistance (PT). Flowsheets (Taken 12/09/2022 1903) LTG: Pt will perform car transfers with assist:: Contact Guard/Touching assist   Problem: RH  Ambulation Goal: LTG Patient will ambulate in controlled environment (PT) Description: LTG: Patient will ambulate in a controlled environment, # of feet with assistance (PT). Flowsheets (Taken 12/09/2022 1903) LTG: Pt will ambulate in controlled environ  assist needed:: Contact Guard/Touching assist LTG: Ambulation distance in controlled environment: 64ft using LRAD Goal: LTG Patient will ambulate in home environment (PT) Description: LTG: Patient will ambulate in home environment, # of feet with assistance (PT). Flowsheets (Taken 12/09/2022 1903) LTG: Pt will ambulate in home environ  assist needed:: Contact Guard/Touching assist LTG: Ambulation distance in home environment: 82ft using LRAD   Problem: RH Stairs Goal: LTG Patient will ambulate up and down stairs w/assist (PT) Description: LTG: Patient will ambulate up and down # of stairs with assistance (PT) Flowsheets (Taken 12/09/2022 1903) LTG: Pt will ambulate up/down stairs assist needed:: Minimal Assistance - Patient > 75% LTG: Pt will  ambulate up and down number of stairs: 4 steps using HRs per home set-up

## 2022-12-09 NOTE — Evaluation (Signed)
Physical Therapy Assessment and Plan  Patient Details  Name: [name] [name] MRN: [id number] Date of Birth: 25-Jul-1940  PT Diagnosis: Abnormal posture, Abnormality of gait, Difficulty walking, Edema, Impaired sensation, and Muscle weakness Rehab Potential: Good ELOS: 2-2.5 weeks   Today's Date: 12/09/2022 PT Individual Time: 0805-0925 PT Individual Time Calculation (min): 80 min    Hospital Problem: Principal Problem:   Pneumonia   Past Medical History:  Past Medical History:  Diagnosis Date   Acute on chronic diastolic heart failure (HCC) 08/22/2013   Acute on chronic respiratory failure with hypoxia (HCC) 10/03/2019   Acute on chronic systolic heart failure (HCC) 05/11/2013   EF from 35% to 50-55% on 6/14 ECHO.    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 No evidence of dissection or endoleak.  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 No evidence of abdominal aortic dissection.  Occluded proximal left subclav   Aortic valve insufficiency    Arthritis    "probably in my knees before they replaced them" (01/28/2015)   Atrial fibrillation (HCC) 05/21/2013   Atrial flutter (HCC)    Bradycardia 08/23/2013   CAD (coronary artery disease)    Chest pain 01/28/2015   CHF (congestive heart failure) (HCC)    Chronic diastolic heart failure (HCC) 01/28/2015   Descending aortic aneurysm (HCC)    DVT (deep venous thrombosis) (HCC)    ?LLE post knee surgery   Dyspnea 09/2019   HCAP (healthcare-associated pneumonia) 10/11/2013   History of blood transfusion    "after one of my knee surgeries"   HTN (hypertension)     Hyperlipidemia    Hypotension 12/17/2019   Incarcerated left inguinal hernia s/p repair 12/02/2019 12/01/2019   Insomnia 09/04/2013   Multiple fractures of ribs, right side, init for clos fx 09/04/2019   Near syncope 01/28/2015   Pressure injury of skin 10/06/2019   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 12/18/2019 12/17/2019   Recurrent UTI 10/06/2013   Shock circulatory (HCC) 10/07/2013   Thoracic aneurysm    Tobacco abuse    Urinary retention 10/06/2013   UTI (urinary tract infection) 10/06/2013   Past Surgical History:  Past Surgical History:  Procedure Laterality Date   AORTIC VALVE REPLACEMENT  02/19/2009   AORTO-FEMORAL BYPASS GRAFT  03/2010   ascending aortic and arch aneurysm repair/notes 04/09/2009   CARDIAC VALVE REPLACEMENT     CAROTID-SUBCLAVIAN BYPASS GRAFT Left 08/26/2013   Procedure: BYPASS GRAFT CAROTID-SUBCLAVIAN;  Surgeon: Nada Libman, MD;  Location: [facility] OR;  Service: Vascular;  Laterality: Left;   CATARACT EXTRACTION W/ INTRAOCULAR LENS  IMPLANT, BILATERAL Bilateral    ENDOVASCULAR STENT INSERTION N/A 08/26/2013   Procedure:  THORACIC STENT GRAFT INSERTION;  Surgeon: Nada Libman, MD;  Location: [facility] OR;  Service: Vascular;  Laterality: N/A;   EYE SURGERY     INGUINAL HERNIA REPAIR Left 12/02/2019   Procedure: REPAIR LEFT INGUINAL HERNIA WITH MESH;  Surgeon: Violeta Gelinas, MD;  Location: [facility] OR;  Service: General;  Laterality: Left;   INGUINAL HERNIA REPAIR Left 12/18/2019   Procedure: LAPAROSCOPIC ASSISTED REPAIR OF RECURRENT INCARCERATED LEFT INGUINAL HERNIA WITH MESH; LAPAOSCOPIC REPAIR  OF SEROSAL TEAR;  Surgeon: Gaynelle Adu, MD;  Location: [facility] OR;  Service: General;  Laterality: Left;   INSERTION OF MESH Left 12/02/2019   Procedure: INSERTION OF MESH;  Surgeon: Violeta Gelinas, MD;  Location: [facility] OR;  Service: General;  Laterality: Left;   JOINT REPLACEMENT     KNEE ARTHROSCOPY Bilateral    REPLACEMENT TOTAL KNEE Bilateral    right  groin lymphocele Right 03/29/2009   RIGHT/LEFT HEART CATH AND CORONARY ANGIOGRAPHY N/A 10/06/2019   Procedure: RIGHT/LEFT HEART CATH AND CORONARY ANGIOGRAPHY;  Surgeon: Lyn Records, MD;  Location: [facility] INVASIVE CV LAB;  Service: Cardiovascular;  Laterality: N/A;   THORACENTESIS  2010 X 2   TONSILLECTOMY      Assessment & Plan Clinical Impression: Patient is a 82 y.o. year old male  who  has a past medical history of Acute on chronic diastolic heart failure (HCC) (06/24/1094), Acute on chronic respiratory failure with hypoxia (HCC) (10/03/2019), Acute on chronic systolic heart failure (HCC) (04/54/0981), Acute respiratory failure with hypoxia (HCC) (10/07/2013), AKI (acute kidney injury) (HCC) (12/17/2019), Aortic atherosclerosis (HCC) (05/05/2013), Aortic dissection (HCC) (05/04/2013), Aortic valve insufficiency, Arthritis, Atrial fibrillation (HCC) (05/21/2013), Atrial flutter (HCC), Bradycardia (08/23/2013), CAD (coronary artery disease), Chest pain (01/28/2015), CHF (congestive heart failure) (HCC), Chronic diastolic heart failure (HCC) (1/91/4782), Descending aortic aneurysm (HCC), DVT (deep venous thrombosis) (HCC), Dyspnea (09/2019), HCAP (healthcare-associated pneumonia) (10/11/2013), History of blood transfusion, HTN (hypertension), Hyperlipidemia, Hypotension (12/17/2019), Incarcerated left inguinal hernia s/p repair 12/02/2019 (12/01/2019), Insomnia (09/04/2013), Multiple fractures of ribs, right side, init for clos fx (09/04/2019), Near syncope (01/28/2015), Pressure injury of skin (10/06/2019), 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   They are presenting to inpatient rehab status post hospitalization for hypoxia with loculated pleural effusion.  Per chart review, on 610, he went to  the ER at [facility] for hypoxia 82% found at wound care clinic.  Prior, he was diagnosed with MRSA pneumonia and septicemia, and discharged home on IV Cubicin and p.o. doxycycline.  CT scan at [facility] ER showed a large loculated left pleural effusion and a small right pleural effusion, along with right upper lobe cavitary lesions consistent with prior MRSA pneumonia.  He underwent left-sided thoracentesis with 900 cc of fluid removed.  Patient's wife refused initiation of IV vancomycin due to prior history renal insufficiency, he was started on Zyvox, and he was transferred to [facility] for infectious disease consult.  Of note, patient had also been holding his Eliquis for 48 hours prior to admission anticipating bilateral second toe amputations by podiatry.   On admission, patient was appreciated to have multiple bilateral lower extremity wounds, family claims from abrasions and not bed wounds.  Admission labs significant for hemoglobin 8.3, proBNP 9870, procalcitonin 0.12, and lactic acid 1.7.  He was admitted under the hospitalist service, and had a prolonged hospital course with limited clinical improvement.  Hospitalization was complicated by cavitary pneumonia with patient unable to tolerate TEE for vegetation confirmation, repeat thoracentesis 6-16, respiratory failure requiring intermittent BiPAP, permanent atrial fibrillation, CHF requiring midodrine for sustained blood pressure, AKI on CKD stage IIIb, normocytic anemia, transaminitis, and care with multiple lower extremity wounds.   Patient lives in a mobile home with 4 steps to enter, with his spouse who is available for 24/7 caregiving and can attend him at a min assist to supervision level.  Patient transferred to CIR on 12/08/2022 .   Patient currently requires mod assist with mobility secondary to muscle weakness and muscle joint tightness, decreased cardiorespiratoy endurance and decreased oxygen support, impaired timing and  sequencing and unbalanced muscle activation,  , and decreased sitting balance, decreased standing balance, decreased postural control, and decreased balance strategies.  Prior to hospitalization, patient was modified independent  with mobility and lived with Spouse (wife, [name]) in a Mobile home home.  Home access is 4Stairs to enter.  Patient will benefit from skilled PT intervention to maximize safe functional mobility, minimize fall risk, and decrease caregiver burden for planned discharge home with 24 hour assist.  Anticipate patient will benefit from follow up [facility] at discharge.  PT - End of Session Activity Tolerance: Tolerates 30+ min activity with multiple rests Endurance Deficit: Yes Endurance Deficit Description: severely impaired cardiopulmonary endurance PT Assessment Rehab Potential (ACUTE/IP ONLY): Good PT Barriers to Discharge: Home environment access/layout;New oxygen;Pending surgery;Wound Care;Nutrition means PT Patient demonstrates impairments in the following area(s): Balance;Perception;Behavior;Safety;Edema;Sensory;Endurance;Skin Integrity;Motor;Nutrition;Pain PT Transfers Functional Problem(s): Bed Mobility;Bed to Chair;Car;Furniture PT Locomotion Functional Problem(s): Ambulation;Wheelchair Mobility;Stairs PT Plan PT Intensity: Minimum of 1-2 x/day ,45 to 90 minutes PT Frequency: 5 out of 7 days PT Duration Estimated Length of Stay: 2-2.5 weeks PT Treatment/Interventions: Ambulation/gait training;Community reintegration;DME/adaptive equipment instruction;Neuromuscular re-education;Psychosocial support;Stair training;UE/LE Strength taining/ROM;Wheelchair propulsion/positioning;Balance/vestibular training;Discharge planning;Functional electrical stimulation;Pain management;Skin care/wound management;Therapeutic Activities;UE/LE Coordination activities;Cognitive remediation/compensation;Disease management/prevention;Functional mobility training;Patient/family  education;Splinting/orthotics;Therapeutic Exercise;Visual/perceptual remediation/compensation PT Transfers Anticipated Outcome(s): CGA using LRAD PT Locomotion Anticipated Outcome(s): CGA using LRAD PT Recommendation Follow Up Recommendations: Home health PT;24 hour supervision/assistance Patient destination: Home Equipment Recommended: To be determined   PT Evaluation Precautions/Restrictions Precautions Precautions: Fall;Other (comment) Precaution Comments: awaiting bilateral 2nd toe amputations, sacral wound, hx of Afib, hx of bil rotator cuff tears Required Braces or Orthoses: Other Brace Other Brace: prevalon boots for skin protection Restrictions Weight Bearing Restrictions: No Pain Pain Assessment Pain Scale: 0-10 Pain Score: 5  Pain Type: Chronic pain Pain Location: Toe (Comment which one) (bilateral 2nd toes) Pain Orientation: Right;Left Pain Descriptors / Indicators: Dull;Aching Pain Onset: On-going (increases with activity) Pain Intervention(s): Medication (See eMAR);Rest;Repositioned;Emotional support Pain Interference Pain Interference Pain Effect on Sleep: 1. Rarely or not at all Pain Interference with Therapy Activities: 1. Rarely or not at all Pain Interference with Day-to-Day Activities: 1. Rarely or not at all Home Living/Prior Functioning Home Living Available Help at Discharge: Family;Available 24 hours/day Type of Home: Mobile home Home Access: Stairs to enter Entrance Stairs-Number of Steps: 4 Entrance Stairs-Rails: Right Home Layout: One level Additional Comments: reports he has been using his RW more recently due to his toes but was previously using [facility]  Lives With: Spouse (wife, [name]) Prior Function Level of Independence: Independent with gait;Independent with transfers;Requires assistive device for independence  Able to Take Stairs?: Yes (with assistance) Vision/Perception  Vision - History Ability to See in Adequate Light: 0  Adequate Perception Perception: Within Functional Limits Praxis Praxis: Intact  Cognition Overall Cognitive Status: No family/caregiver present to determine baseline cognitive functioning Arousal/Alertness: Awake/alert Orientation Level: Oriented X4 Year: 2024 Month: June Day of Week: Correct Attention: Focused;Sustained Focused Attention: Appears intact Sustained Attention: Appears intact Safety/Judgment: Appears intact Sensation Sensation Light Touch: Impaired Detail Peripheral sensation comments: decreased distally Light Touch Impaired Details: Impaired RLE;Impaired LLE Hot/Cold: Not tested Proprioception: Impaired by gross assessment (appears to have decreased awareness of  BLE placement during gait) Stereognosis: Not tested Coordination Gross Motor Movements are Fluid and Coordinated: No Coordination and  Movement Description: severe generalized weakness and deconditioning Motor  Motor Motor: Other (comment);Abnormal postural alignment and control Motor - Skilled Clinical Observations: severe generalized weakness and deconditioning   Trunk/Postural Assessment  Cervical Assessment Cervical Assessment: Exceptions to [facility] (forward head) Thoracic Assessment Thoracic Assessment: Exceptions to [facility] (rounded shoulders with thoracic kyphosis) Lumbar Assessment Lumbar Assessment: Exceptions to [facility] (posterior pelvic tilt) Postural Control Postural Control: Deficits on evaluation (limited by generalized weakness, requires B UE support on RW)  Balance Balance Balance Assessed: Yes Static Sitting Balance Static Sitting - Balance Support: Feet supported Static Sitting - Level of Assistance: 5: Stand by assistance Dynamic Sitting Balance Dynamic Sitting - Balance Support: During functional activity;Feet supported Dynamic Sitting - Level of Assistance: 5: Stand by assistance;Other (comment) (CGA) Static Standing Balance Static Standing - Balance Support: Bilateral upper extremity  supported;During functional activity Static Standing - Level of Assistance: 4: Min assist Dynamic Standing Balance Dynamic Standing - Balance Support: During functional activity;Bilateral upper extremity supported Dynamic Standing - Level of Assistance: 3: Mod assist Extremity Assessment      RLE Assessment RLE Assessment: Exceptions to [facility] Active Range of Motion (AROM) Comments: decreased ankle DF and PF AROM; avoided pushing into PROM due to skin integrity; decreased knee flexion AROM but WFL (hx of TKA) General Strength Comments: assessed in supine RLE Strength Right Hip Flexion: 3-/5 Right Hip Extension: 3-/5 Right Knee Flexion: 3+/5 Right Knee Extension: 3/5 Right Ankle Dorsiflexion: 2/5 (within available range) Right Ankle Plantar Flexion: 2/5 (within available range) LLE Assessment LLE Assessment: Exceptions to [facility] Active Range of Motion (AROM) Comments: decreased ankle DF & PF AROM (but slightly more than R LE); decreased knee flexion ROM (more limited than R LE) General Strength Comments: assessed in supine LLE Strength Left Hip Flexion: 2+/5 Left Hip Extension: 2+/5 Left Knee Flexion: 3/5 (through available range) Left Knee Extension: 3/5 (lacks final few degrees of knee extension likely due to ROM deficits) Left Ankle Dorsiflexion: 2/5 (through available range, slightly more than R LE) Left Ankle Plantar Flexion: 2/5 (through available range, slightly more than R LE)  Care Tool Care Tool Bed Mobility Roll left and right activity   Roll left and right assist level: Minimal Assistance - Patient > 75% (using bedrail) Roll left and right assistive device comment: using bedrail  Sit to lying activity   Sit to lying assist level: Moderate Assistance - Patient 50 - 74% Sit to lying assistive device comment: using bedrail  Lying to sitting on side of bed activity   Lying to sitting on side of bed assist level: the ability to move from lying on the back to sitting on the  side of the bed with no back support.: Moderate Assistance - Patient 50 - 74% Lying to sitting on side of bed assist device comment: the ability to move from lying on the back to sitting on the side of the bed with no back support.: using bedrail   Care Tool Transfers Sit to stand transfer   Sit to stand assist level: Moderate Assistance - Patient 50 - 74% Sit to stand assistive device: Walker  Chair/bed transfer   Chair/bed transfer assist level: Moderate Assistance - Patient 50 - 74% Chair/bed transfer assistive device: Geophysicist/field seismologist transfer activity did not occur: Safety/medical concerns        Care Tool Locomotion Ambulation   Assist level: 2 helpers (min A of 1 and +2 w/c follow -  severe gait deviations) Assistive device: Walker-rolling Max distance: 40ft  Walk 10 feet activity   Assist level: 2 helpers Assistive device: Walker-rolling   Walk 50 feet with 2 turns activity Walk 50 feet with 2 turns activity did not occur: Safety/medical concerns      Walk 150 feet activity Walk 150 feet activity did not occur: Safety/medical concerns      Walk 10 feet on uneven surfaces activity Walk 10 feet on uneven surfaces activity did not occur: Safety/medical concerns      Stairs Stair activity did not occur: Safety/medical concerns        Walk up/down 1 step activity Walk up/down 1 step or curb (drop down) activity did not occur: Safety/medical concerns      Walk up/down 4 steps activity Walk up/down 4 steps activity did not occur: Safety/medical concerns      Walk up/down 12 steps activity Walk up/down 12 steps activity did not occur: Safety/medical concerns      Pick up small objects from floor   Pick up small object from the floor assist level: Dependent - Patient 0% Pick up small object from the floor assistive device: RW  Wheelchair Is the patient using a wheelchair?: Yes     Wheelchair assist level: Dependent - Patient  0%    Wheel 50 feet with 2 turns activity   Assist Level: Dependent - Patient 0%  Wheel 150 feet activity   Assist Level: Dependent - Patient 0%    Refer to Care Plan for Long Term Goals  SHORT TERM GOAL WEEK 1 PT Short Term Goal 1 (Week 1): Pt will perform supine<>sit with min assist PT Short Term Goal 2 (Week 1): Pt will consistently perform sit<>stands using LRAD with min assist PT Short Term Goal 3 (Week 1): Pt will perform bed<>chair transfers using LRAD with min assist PT Short Term Goal 4 (Week 1): Pt will ambulate at least 49ft using LRAD with min assist of 1 and +2 for line management as needed PT Short Term Goal 5 (Week 1): Pt will navigate 4 steps using HRs with mod assist of 1 and +2 for safety if needed  Recommendations for other services: None   Skilled Therapeutic Intervention Pt received supported upright in bed with NT present assisting with breakfast. Pt agreeable to therapy session. Evaluation completed (see details above) with patient education regarding purpose of PT evaluation, PT POC and goals, therapy schedule, weekly team meetings, and other CIR information including safety plan and fall risk safety. Pt performed the below functional mobility tasks with the specified levels of skilled cuing and assistance.   Therapist provided pt with RW and wheelchair with ELRs and a pressure relieving cushion; however, pt may benefit from Roho cushion once one becomes available.  Therapist educated pt on need for shoes to cover his toes to protect them during functional mobility - notified pt's wife of this at end of session.   Pt received on 2.5L of O2 via nasal cannula and transitioned to 2L on portable tank during session. At EOB: SpO2 96% and HR 110bpm. After ambulation: SpO2 95% and HR 110pm.   Pt with severe endurance impairments, requiring prolonged therapeutic rest breaks throughout session and limiting his ability to further participate in standing/ambulation.  At end  of session, pt left seated up in w/c with needs in reach, seat belt alarm on, and his family present.   Mobility Bed Mobility Bed Mobility: Supine to Sit;Sit to Supine Supine to Sit: Moderate Assistance -  Patient 50-74% (using bedrail via logroll technique with step-by-step cuing for sequencing for increased pt independence) Transfers Transfers: Sit to Stand;Stand to Sit;Stand Pivot Transfers Sit to Stand: Moderate Assistance - Patient 50-74% (mod A to power up into standing) Stand to Sit: Moderate Assistance - Patient 50-74%;Minimal Assistance - Patient > 75% Stand Pivot Transfers: Minimal Assistance - Patient > 75% Stand Pivot Transfer Details: Verbal cues for sequencing;Verbal cues for technique;Tactile cues for sequencing;Tactile cues for posture;Tactile cues for weight shifting;Tactile cues for initiation;Manual facilitation for weight shifting;Verbal cues for precautions/safety;Verbal cues for gait pattern Transfer (Assistive device): Rolling walker Locomotion  Gait Ambulation: Yes Gait Assistance: Minimal Assistance - Patient > 75%;2 Helpers (+2 w/c follow and O2 line management) Gait Distance (Feet): 13 Feet Assistive device: Rolling walker Gait Assistance Details: Verbal cues for precautions/safety;Verbal cues for sequencing;Other (comment);Tactile cues for sequencing;Verbal cues for gait pattern;Verbal cues for technique (verbal cues for breathing) Gait Gait: Yes Gait Pattern: Impaired Gait Pattern: Decreased step length - left;Decreased step length - right;Step-to pattern;Poor foot clearance - left;Poor foot clearance - right;Narrow base of support;Trunk flexed (VERY short steps with narrow BOS, downward gaze) Gait velocity: severely decreased Stairs / Additional Locomotion Stairs: No Wheelchair Mobility Wheelchair Mobility: No   Discharge Criteria: Patient will be discharged from PT if patient refuses treatment 3 consecutive times without medical reason, if treatment goals  not met, if there is a change in medical status, if patient makes no progress towards goals or if patient is discharged from hospital.  The above assessment, treatment plan, treatment alternatives and goals were discussed and mutually agreed upon: by patient and by family  Ginny Forth , PT, DPT, NCS, CSRS 12/09/2022, 9:14 AM

## 2022-12-09 NOTE — Progress Notes (Signed)
PROGRESS NOTE   Subjective/Complaints: No new complaints this morning Hgb improved to 8.4 [facility] Nursing note reviewed  ROS: denies pain   Objective:   No results found. Recent Labs    12/07/22 0500 12/09/22 0330  WBC 9.9 9.4  HGB 8.2* 8.4*  HCT 28.1* 29.2*  PLT 166 140*   Recent Labs    12/07/22 0500 12/09/22 0330  NA 139 142  K 3.8 4.0  CL 100 102  CO2 30 31  GLUCOSE 119* 110*  BUN 39* 39*  CREATININE 1.71* 1.61*  CALCIUM 8.4* 8.5*    Intake/Output Summary (Last 24 hours) at 12/09/2022 1606 Last data filed at 12/09/2022 1338 Gross per 24 hour  Intake 537 ml  Output 101 ml  Net 436 ml     Pressure Injury 11/29/22 Heel Right Stage 3 -  Full thickness tissue loss. Subcutaneous fat may be visible but bone, tendon or muscle are NOT exposed. (Active)  11/29/22 1046  Location: Heel  Location Orientation: Right  Staging: Stage 3 -  Full thickness tissue loss. Subcutaneous fat may be visible but bone, tendon or muscle are NOT exposed.  Wound Description (Comments):   Present on Admission: Yes     Pressure Injury 12/01/22 Buttocks Bilateral Deep Tissue Pressure Injury - Purple or maroon localized area of discolored intact skin or blood-filled blister due to damage of underlying soft tissue from pressure and/or shear. (Active)  12/01/22 1745  Location: Buttocks  Location Orientation: Bilateral  Staging: Deep Tissue Pressure Injury - Purple or maroon localized area of discolored intact skin or blood-filled blister due to damage of underlying soft tissue from pressure and/or shear.  Wound Description (Comments):   Present on Admission:      Pressure Injury 12/04/22 Coccyx Stage 2 -  Partial thickness loss of dermis presenting as a shallow open injury with a red, pink wound bed without slough. (Active)  12/04/22 2010  Location: Coccyx  Location Orientation:   Staging: Stage 2 -  Partial thickness loss of  dermis presenting as a shallow open injury with a red, pink wound bed without slough.  Wound Description (Comments):   Present on Admission: Yes     Pressure Injury 12/04/22 Buttocks Bilateral Stage 2 -  Partial thickness loss of dermis presenting as a shallow open injury with a red, pink wound bed without slough. (Active)  12/04/22 2010  Location: Buttocks  Location Orientation: Bilateral  Staging: Stage 2 -  Partial thickness loss of dermis presenting as a shallow open injury with a red, pink wound bed without slough.  Wound Description (Comments):   Present on Admission: Yes    Physical Exam: Vital Signs Blood pressure 119/64, pulse 84, temperature 97.7 F (36.5 C), temperature source Oral, resp. rate 17, height 5\' 10"  (1.778 m), weight 116 kg, SpO2 95 %. Gen: no distress, normal appearing HEENT: oral mucosa pink and moist, NCAT Cardio: Reg rate Chest: normal effort, normal rate of breathing GU: + external Foley, draining clear urine.  Skin: Multiple wounds + Sacral PI, Stage 4 + R heel PI, unstageable + R 2nd toe extruded bone; no apparent drainage + L second toe wound with clean base, bloody drainage +  B/l shin wounds, healing                                 MSK:      No apparent deformity. BL UE shoulder abduction limited to <90 degrees d/t bilateral rotator cuff tears.      Strength:                RUE: 3/5 SA, 3/5 EF, 3/5 EE, 4/5 WE, 4/5 FF, 4/5 FA                 LUE: 3/5 SA, 3/5 EF, 3/5 EE, 4/5 WE, 4/5 FF, 4/5 FA                 RLE: 2/5 HF, 4/5 KE, 4/5 DF, 4/5 PF                 LLE:  2/5 HF, 4/5 KE, 4/5 DF, 4/5 PF    Neurologic exam:  Cognition: AAO to person, place, time and event.  Language: Fluent, No substitutions or neoglisms. +Hypophonic, breathy vocalizations Memory: Recalls 2/3 objects at 5 minutes.  Insight: Poor insight into current condition.  Mood: Flat affect, appropriate mood.  Sensation: To light touch reduced in bilateral  LEs Reflexes: 2+ in BL UE and Les  CN: 2-12 grossly intact.  Coordination: R>L UE intention tremors Spasticity: MAS 0 in all extremities.    Assessment/Plan: 1. Functional deficits which require 3+ hours per day of interdisciplinary therapy in a comprehensive inpatient rehab setting. Physiatrist is providing close team supervision and 24 hour management of active medical problems listed below. Physiatrist and rehab team continue to assess barriers to discharge/monitor patient progress toward functional and medical goals  Care Tool:  Bathing    Body parts bathed by patient: Abdomen, Face, Chest   Body parts bathed by helper: Front perineal area, Buttocks, Right upper leg, Left upper leg, Right lower leg, Left lower leg, Right arm, Left arm     Bathing assist Assist Level: Maximal Assistance - Patient 24 - 49%     Upper Body Dressing/Undressing Upper body dressing   What is the patient wearing?: Pull over shirt    Upper body assist Assist Level: Maximal Assistance - Patient 25 - 49%    Lower Body Dressing/Undressing Lower body dressing      What is the patient wearing?: Pants     Lower body assist Assist for lower body dressing: Total Assistance - Patient < 25%     Toileting Toileting Toileting Activity did not occur Press photographer and hygiene only): N/A (no void or bm)  Toileting assist       Transfers Chair/bed transfer  Transfers assist     Chair/bed transfer assist level: Dependent - mechanical lift (stedy) Chair/bed transfer assistive device: Walker, Archivist   Ambulation assist      Assist level: 2 helpers (min A of 1 and +2 w/c follow - severe gait deviations) Assistive device: Walker-rolling Max distance: 72ft   Walk 10 feet activity   Assist     Assist level: 2 helpers Assistive device: Walker-rolling   Walk 50 feet activity   Assist Walk 50 feet with 2 turns activity did not occur: Safety/medical  concerns         Walk 150 feet activity   Assist Walk 150 feet activity did not occur: Safety/medical concerns  Walk 10 feet on uneven surface  activity   Assist Walk 10 feet on uneven surfaces activity did not occur: Safety/medical concerns         Wheelchair     Assist Is the patient using a wheelchair?: Yes      Wheelchair assist level: Dependent - Patient 0%      Wheelchair 50 feet with 2 turns activity    Assist        Assist Level: Dependent - Patient 0%   Wheelchair 150 feet activity     Assist      Assist Level: Dependent - Patient 0%   Blood pressure 119/64, pulse 84, temperature 97.7 F (36.5 C), temperature source Oral, resp. rate 17, height 5\' 10"  (1.778 m), weight 116 kg, SpO2 95 %.  Medical Problem List and Plan: 1. Functional deficits secondary to debility from loculated pleural effusion and MRSA pneumonia             -patient may shower             -ELOS/Goals: 7 to 10 days, PT/OT SPV   2.  Antithrombotics: -DVT/anticoagulation:  Pharmaceutical: Eliquis             -antiplatelet therapy: Aspirin    3. Pain Management: Tylenol, tramadol as needed   4. Mood/Behavior/Sleep: LCSW to evaluate and provide emotional support             -antipsychotic agents: n/a   5. Neuropsych/cognition: This patient is not capable of making decisions on his own behalf.   6. Skin/Wound Care: Routine skin care checks             -local care to sacrum, bilateral toes (Dr. Lajoyce Corners)             -continue pressure relief/Prevalon boots/mattress             - Added multivitamin with minerals and ascorbic acid 500 mg daily for wound healing on admission   7. Fluids/Electrolytes/Nutrition/Malnutrition: Routine Is and Os and follow-up chemistries             - Poor POS, no appetite; Ensure TID, consider Marinol    8: Hypotension: monitor TID and prn             -continue midodrine    9: Hyperlipidemia: continue statin   10: Urinary  retention: continue Flomax   11: combined chronic systolic/diastolic heart failure: daily weight             -continue Pacerone 200 mg daily             -on no diuretics   12: chronic atrial fibrillation: rate controlled             -on Eliquis, Pacerone   13: Pneumonia with effusion/MRSA sepsis:             -continue Zyvox for 4 weeks             -O2 via [place] at 2 L             -BiPAP as needed/consult respiratory therapy   14: s/p AVR, stable thoracic aortic aneurysm: follow-up outpatient   15: AKI atop CKD IIIb: (baseline 1.2)             -slowing improving, encourage adequate hydration   16: Chronic anemia/iron deficiency: f/u CBC on Mondays. Iron supplement started   17: Hard of hearing: wife to bring in hearing aids  75. Screening for vitamin D  deficiency: add on vitamin D level.     LOS: 1 days A FACE TO FACE EVALUATION WAS PERFORMED  Drema Pry Elta Angell 12/09/2022, 4:06 PM

## 2022-12-09 NOTE — Evaluation (Signed)
Occupational Therapy Assessment and Plan  Patient Details  Name: [name] [name] MRN: [id number] Date of Birth: 1941/05/27  OT Diagnosis: muscle weakness (generalized) Rehab Potential: Rehab Potential (ACUTE ONLY): Good ELOS: 14-18 days   Today's Date: 12/09/2022 OT Individual Time:  -        Hospital Problem: Principal Problem:   Pneumonia   Past Medical History:  Past Medical History:  Diagnosis Date   Acute on chronic diastolic heart failure (HCC) 08/22/2013   Acute on chronic respiratory failure with hypoxia (HCC) 10/03/2019   Acute on chronic systolic heart failure (HCC) 05/11/2013   EF from 35% to 50-55% on 6/14 ECHO.    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 No evidence of dissection or endoleak.  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 No evidence of abdominal aortic dissection.  Occluded proximal left subclav   Aortic valve insufficiency    Arthritis    "probably in my knees before they replaced them" (01/28/2015)   Atrial fibrillation (HCC) 05/21/2013   Atrial flutter (HCC)    Bradycardia 08/23/2013   CAD (coronary artery disease)    Chest pain 01/28/2015   CHF (congestive heart failure) (HCC)    Chronic diastolic heart failure (HCC) 01/28/2015   Descending aortic aneurysm (HCC)    DVT (deep venous thrombosis) (HCC)    ?LLE post knee surgery   Dyspnea 09/2019   HCAP (healthcare-associated pneumonia) 10/11/2013   History of blood transfusion    "after one of my knee surgeries"   HTN (hypertension)    Hyperlipidemia    Hypotension 12/17/2019   Incarcerated left inguinal hernia s/p repair  12/02/2019 12/01/2019   Insomnia 09/04/2013   Multiple fractures of ribs, right side, init for clos fx 09/04/2019   Near syncope 01/28/2015   Pressure injury of skin 10/06/2019   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 12/18/2019 12/17/2019   Recurrent UTI 10/06/2013   Shock circulatory (HCC) 10/07/2013   Thoracic aneurysm    Tobacco abuse    Urinary retention 10/06/2013   UTI (urinary tract infection) 10/06/2013   Past Surgical History:  Past Surgical History:  Procedure Laterality Date   AORTIC VALVE REPLACEMENT  02/19/2009   AORTO-FEMORAL BYPASS GRAFT  03/2010   ascending aortic and arch aneurysm repair/notes 04/09/2009   CARDIAC VALVE REPLACEMENT     CAROTID-SUBCLAVIAN BYPASS GRAFT Left 08/26/2013   Procedure: BYPASS GRAFT CAROTID-SUBCLAVIAN;  Surgeon: Nada Libman, MD;  Location: [facility] OR;  Service: Vascular;  Laterality: Left;   CATARACT EXTRACTION W/ INTRAOCULAR LENS  IMPLANT, BILATERAL Bilateral    ENDOVASCULAR STENT INSERTION N/A 08/26/2013   Procedure:  THORACIC STENT GRAFT INSERTION;  Surgeon: Nada Libman, MD;  Location: [facility] OR;  Service: Vascular;  Laterality: N/A;   EYE SURGERY     INGUINAL HERNIA REPAIR Left 12/02/2019   Procedure: REPAIR LEFT INGUINAL HERNIA WITH MESH;  Surgeon: Violeta Gelinas, MD;  Location: [facility] OR;  Service: General;  Laterality: Left;   INGUINAL HERNIA REPAIR Left 12/18/2019   Procedure: LAPAROSCOPIC ASSISTED REPAIR OF RECURRENT INCARCERATED LEFT INGUINAL HERNIA WITH MESH; LAPAOSCOPIC REPAIR OF SEROSAL TEAR;  Surgeon: Gaynelle Adu, MD;  Location: [facility] OR;  Service: General;  Laterality: Left;   INSERTION OF MESH Left 12/02/2019   Procedure: INSERTION OF MESH;  Surgeon: Violeta Gelinas, MD;  Location: [facility] OR;  Service: General;  Laterality: Left;   JOINT REPLACEMENT     KNEE ARTHROSCOPY Bilateral    REPLACEMENT TOTAL KNEE Bilateral    right groin lymphocele Right 03/29/2009   RIGHT/LEFT HEART CATH AND CORONARY ANGIOGRAPHY N/A  10/06/2019   Procedure: RIGHT/LEFT HEART CATH AND CORONARY ANGIOGRAPHY;  Surgeon: Lyn Records, MD;  Location: [facility] INVASIVE CV LAB;  Service: Cardiovascular;  Laterality: N/A;   THORACENTESIS  2010 X 2   TONSILLECTOMY      Assessment & Plan Clinical Impression: [name] [name] is a 82 y.o. year old male  who  has a past medical history of Acute on chronic diastolic heart failure (HCC) (12/25/2954), Acute on chronic respiratory failure with hypoxia (HCC) (10/03/2019), Acute on chronic systolic heart failure (HCC) (21/30/8657), Acute respiratory failure with hypoxia (HCC) (10/07/2013), AKI (acute kidney injury) (HCC) (12/17/2019), Aortic atherosclerosis (HCC) (05/05/2013), Aortic dissection (HCC) (05/04/2013), Aortic valve insufficiency, Arthritis, Atrial fibrillation (HCC) (05/21/2013), Atrial flutter (HCC), Bradycardia (08/23/2013), CAD (coronary artery disease), Chest pain (01/28/2015), CHF (congestive heart failure) (HCC), Chronic diastolic heart failure (HCC) (8/46/9629), Descending aortic aneurysm (HCC), DVT (deep venous thrombosis) (HCC), Dyspnea (09/2019), HCAP (healthcare-associated pneumonia) (10/11/2013), History of blood transfusion, HTN (hypertension), Hyperlipidemia, Hypotension (12/17/2019), Incarcerated left inguinal hernia s/p repair 12/02/2019 (12/01/2019), Insomnia (09/04/2013), Multiple fractures of ribs, right side, init for clos fx (09/04/2019), Near syncope (01/28/2015), Pressure injury of skin (10/06/2019), 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   They are presenting to inpatient rehab status post hospitalization for hypoxia with loculated pleural effusion.  Per chart review, on 610, he went to the ER at [facility] for hypoxia 82% found at wound care clinic.   Prior, he was diagnosed with MRSA pneumonia and septicemia, and discharged home on IV Cubicin and p.o. doxycycline.  CT scan at [facility] ER showed a large loculated left pleural effusion and a small right pleural effusion, along with right upper lobe cavitary lesions consistent with prior MRSA pneumonia.  He underwent left-sided thoracentesis with 900 cc of fluid removed.  Patient's wife refused initiation of IV vancomycin due to prior history renal insufficiency, he was started on Zyvox, and he was transferred to [facility] for infectious disease consult.  Of note, patient had also been holding his Eliquis for 48 hours prior to admission anticipating bilateral second toe amputations by podiatry.   On admission, patient was appreciated to have multiple bilateral lower extremity wounds, family claims from abrasions and not bed wounds.  Admission labs significant for hemoglobin 8.3, proBNP 9870, procalcitonin 0.12, and lactic acid 1.7.  He was admitted under the hospitalist service, and had a prolonged hospital course with limited clinical improvement.  Hospitalization was complicated by cavitary pneumonia with patient unable to tolerate TEE for vegetation confirmation, repeat thoracentesis 6-16, respiratory failure requiring intermittent BiPAP, permanent atrial fibrillation, CHF requiring midodrine for sustained blood pressure, AKI on CKD stage IIIb, normocytic anemia, transaminitis, and care with multiple lower extremity wounds.   Patient lives in a mobile home with 4 steps to enter, with his spouse who is available for 24/7 caregiving and can attend him at a min assist to supervision level.  Patient transferred to CIR on 12/08/2022 .  Patient currently requires max with basic self-care skills secondary to muscle weakness, decreased cardiorespiratoy endurance, and decreased standing balance, decreased postural control, and decreased balance strategies.  Prior to hospitalization, patient could complete ADLs  with modified independent .  Patient will benefit from skilled intervention to increase independence with basic self-care skills prior to discharge home with care partner.  Anticipate patient will require  min physical assist with dressing and bathing and 24 hour supervision  and follow up home health.  OT - End of Session Activity Tolerance: Decreased this session;Tolerates 30+ min activity with multiple rests Endurance Deficit: Yes OT Assessment Rehab Potential (ACUTE ONLY): Good OT Patient demonstrates impairments in the following area(s): Balance;Perception;Pain;Safety;Edema;Cognition;Sensory;Endurance;Skin Integrity;Motor;Nutrition OT Basic ADL's Functional Problem(s): Grooming;Bathing;Dressing;Toileting OT Transfers Functional Problem(s): Toilet OT Additional Impairment(s): Fuctional Use of Upper Extremity (limited AROM) OT Plan OT Intensity: Minimum of 1-2 x/day, 45 to 90 minutes OT Frequency: 5 out of 7 days OT Duration/Estimated Length of Stay: 14-18 days OT Treatment/Interventions: Balance/vestibular training;Disease mangement/prevention;Community reintegration;Functional electrical stimulation;Patient/family education;Self Care/advanced ADL retraining;Splinting/orthotics;Therapeutic Exercise;UE/LE Coordination activities;Wheelchair propulsion/positioning;Visual/perceptual remediation/compensation;Therapeutic Activities;UE/LE Strength taining/ROM;Skin care/wound managment;Pain management;Psychosocial support;Functional mobility training;Cognitive remediation/compensation;Discharge planning;DME/adaptive equipment instruction OT Self Feeding Anticipated Outcome(s): N/A OT Basic Self-Care Anticipated Outcome(s): min A OT Toileting Anticipated Outcome(s): supervision OT Bathroom Transfers Anticipated Outcome(s): supervision OT Recommendation Patient destination: Home Follow Up Recommendations: Home health OT;24 hour supervision/assistance Equipment Recommended: None recommended by  OT Equipment Details: Family has multipe equipment items   OT Evaluation Precautions/Restrictions  Precautions Precautions: Fall;Other (comment) Precaution Comments: awaiting bilateral 2nd toe amputations, sacral wound, hx of Afib, hx of bil rotator cuff tears Required Braces or Orthoses: Other Brace Other Brace: prevalon boots for skin protection Restrictions Weight Bearing Restrictions: No General   Vital Signs   Pain Pain Assessment Pain Scale: 0-10 Pain Score: 3  Pain Type: Chronic pain Pain Location: Toe (Comment which one) (bilateral 2nd toes) Pain Orientation: Right;Left Pain Descriptors / Indicators: Dull;Aching Pain Onset: On-going (increases with activity) Pain Intervention(s): Medication (See eMAR);Rest;Repositioned;Emotional support Home Living/Prior Functioning Home Living Available Help at Discharge: Family, Available 24 hours/day, Other (Comment) (per chart wife able to provide min A) Type of Home: Mobile home Home Access: Stairs to enter Entrance Stairs-Number of Steps: 4 Entrance Stairs-Rails: Right Home Layout: One level Bathroom Shower/Tub: Health visitor: Handicapped height Bathroom Accessibility: Yes Additional Comments: reports he has been using his RW more recently due to his toes but was previously using SPC  Lives With: Spouse Prior Function Level of Independence: Needs assistance with ADLs Dressing: Minimal  Able to Take Stairs?: Yes Vision Baseline Vision/History: 1 Wears glasses (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) Ability to See in Adequate Light: 0 Adequate Patient Visual Report: No change from baseline Vision Assessment?: No apparent visual deficits Perception  Perception: Within Functional Limits Praxis Praxis: Intact Cognition Cognition Overall Cognitive Status: Within Functional Limits for tasks assessed (wife present as well) Arousal/Alertness: Awake/alert Orientation Level: Person;Place;Situation Person:  Oriented Place: Oriented Situation: Oriented Memory: Appears intact Attention: Focused;Sustained Focused Attention: Appears intact Sustained Attention: Appears intact Safety/Judgment: Appears intact Brief Interview for Mental Status (BIMS) Repetition of Three Words (First Attempt): 3 Temporal Orientation: Year: Correct Temporal Orientation: Month: Accurate within 5 days Temporal Orientation: Day: Correct Recall: "Sock": Yes, after cueing ("something to wear") Recall: "Blue": Yes, after cueing ("a color") Recall: "Bed": Yes, after cueing ("a piece of furniture") BIMS Summary Score: 12 Sensation Sensation Light Touch: Impaired Detail Peripheral sensation comments: decreased distally Light Touch Impaired Details: Impaired RLE;Impaired LLE  Hot/Cold: Not tested Proprioception: Appears Intact Stereognosis: Not tested Coordination Gross Motor Movements are Fluid and Coordinated: No Fine Motor Movements are Fluid and Coordinated: No Coordination and Movement Description: generalized weakness Motor  Motor Motor: Other (comment) Motor - Skilled Clinical Observations: generalized weakness  Trunk/Postural Assessment  Cervical Assessment Cervical Assessment: Exceptions to [facility] (forward head) Thoracic Assessment Thoracic Assessment: Exceptions to [facility] (rounded back) Lumbar Assessment Lumbar Assessment: Exceptions to [facility] (posterior pelvic tilt) Postural Control Postural Control: Deficits on evaluation (limited by generalized weakness)  Balance Balance Balance Assessed: Yes Dynamic Sitting Balance Dynamic Sitting - Balance Support: During functional activity;Feet supported Dynamic Sitting - Level of Assistance: 5: Stand by assistance Static Standing Balance Static Standing - Balance Support: Bilateral upper extremity supported Static Standing - Level of Assistance: 3: Mod assist Extremity/Trunk Assessment RUE Assessment RUE Assessment: Exceptions to [facility] Passive Range of Motion  (PROM) Comments: able to achieve approx. 90 degrees shoulder flexion Active Range of Motion (AROM) Comments: limited due to hx of rotator cuff tears General Strength Comments: 3/5 LUE Assessment LUE Assessment: Exceptions to [facility] Passive Range of Motion (PROM) Comments: able to achieve approx. 90 degrees shoulder flexion Active Range of Motion (AROM) Comments: limited due to hx of rotator cuff tear General Strength Comments: 3/5  Care Tool Care Tool Self Care Eating        Oral Care    Oral Care Assist Level: Set up assist    Bathing   Body parts bathed by patient: Abdomen;Face;Chest Body parts bathed by helper: Front perineal area;Buttocks;Right upper leg;Left upper leg;Right lower leg;Left lower leg;Right arm;Left arm   Assist Level: Maximal Assistance - Patient 24 - 49%    Upper Body Dressing(including orthotics)   What is the patient wearing?: Pull over shirt   Assist Level: Maximal Assistance - Patient 25 - 49%    Lower Body Dressing (excluding footwear)   What is the patient wearing?: Pants Assist for lower body dressing: Total Assistance - Patient < 25%    Putting on/Taking off footwear   What is the patient wearing?: Socks;Shoes Assist for footwear: Total Assistance - Patient < 25%       Care Tool Toileting Toileting activity Toileting Activity did not occur (Clothing management and hygiene only): N/A (no void or bm)       Care Tool Bed Mobility Roll left and right activity   Roll left and right assist level: Minimal Assistance - Patient > 75% (using bedrail) Roll left and right assistive device comment: using bedrail  Sit to lying activity   Sit to lying assist level: Moderate Assistance - Patient 50 - 74% Sit to lying assistive device comment: using bedrail  Lying to sitting on side of bed activity   Lying to sitting on side of bed assist level: the ability to move from lying on the back to sitting on the side of the bed with no back support.: Maximal  Assistance - Patient 25 - 49% Lying to sitting on side of bed assist device comment: the ability to move from lying on the back to sitting on the side of the bed with no back support.: using bedrail   Care Tool Transfers Sit to stand transfer   Sit to stand assist level: Maximal Assistance - Patient 25 - 49% Sit to stand assistive device: Walker  Chair/bed transfer   Chair/bed transfer assist level: Dependent - mechanical lift (stedy) Chair/bed transfer assistive device: Theme park manager transfer activity did not occur: Refused  Care Tool Cognition  Expression of Ideas and Wants Expression of Ideas and Wants: 4. Without difficulty (complex and basic) - expresses complex messages without difficulty and with speech that is clear and easy to understand  Understanding Verbal and Non-Verbal Content Understanding Verbal and Non-Verbal Content: 4. Understands (complex and basic) - clear comprehension without cues or repetitions   Memory/Recall Ability Memory/Recall Ability : Current season;That he or she is in a hospital/hospital unit   Refer to Care Plan for Long Term Goals  SHORT TERM GOAL WEEK 1 OT Short Term Goal 1 (Week 1): Pt will complete sit<>stand during self-care task with mod assist. OT Short Term Goal 2 (Week 1): To promote participation in ADLs, pt will engage in functional activity in standing for 2+ min without rest break. OT Short Term Goal 3 (Week 1): Pt will complete toilet transfer with mod assist. OT Short Term Goal 4 (Week 1): Pt will complete UB dressing with min assist.  Recommendations for other services: None    Skilled Therapeutic Intervention OT evaluation completed. Discussed role of OT, goals of therapy, possible ELOS, safety plan, team conference, etc with wife present and verbalizing understanding. Pt on 2.5L O2 throughout session requiring frequent rest breaks. Pt completed bathing and dressing while seated in w/c requiring max  assist overall. Pt limited by generalized weakness and BUE AROM. Completed sit<>stand at sink 1x with max A and pt unable to achieve full upright posture and tolerating <10 seconds. Presented stedy and encouraged practiced to [facility], however pt declined. Used stedy to transfer to bed with mod assist required. Pt with max assist to transition to supine with HOB elevated and all needs in reach.   ADL ADL Eating: Not assessed Grooming: Setup Where Assessed-Grooming: Sitting at sink;Wheelchair Upper Body Bathing: Moderate assistance Where Assessed-Upper Body Bathing: Wheelchair;Sitting at sink Lower Body Bathing: Dependent;Maximal assistance Where Assessed-Lower Body Bathing: Standing at sink;Sitting at sink Upper Body Dressing: Maximal assistance Where Assessed-Upper Body Dressing: Wheelchair Lower Body Dressing: Dependent Where Assessed-Lower Body Dressing: Sitting at sink;Standing at sink Toileting: Not assessed Toilet Transfer: Not assessed Tub/Shower Transfer: Not assessed Walk-In Shower Transfer: Not assessed Mobility  Bed Mobility Bed Mobility: Supine to Sit;Sit to Supine Supine to Sit: Moderate Assistance - Patient 50-74% (using bedrail via logroll technique with step-by-step cuing for sequencing for increased pt independence) Transfers Sit to Stand: Maximal Assistance - Patient 25-49% Stand to Sit: Maximal Assistance - Patient 25-49%   Discharge Criteria: Patient will be discharged from OT if patient refuses treatment 3 consecutive times without medical reason, if treatment goals not met, if there is a change in medical status, if patient makes no progress towards goals or if patient is discharged from hospital.  The above assessment, treatment plan, treatment alternatives and goals were discussed and mutually agreed upon: by patient  Daneil Dan 12/09/2022, 12:05 PM

## 2022-12-09 NOTE — Progress Notes (Signed)
Physical Therapy Session Note  Patient Details  Name: [name] [name] MRN: [id number] Date of Birth: [birth year]  Today's Date: 12/09/2022 PT Individual Time: 1553-1640 PT Individual Time Calculation (min): 47 min   Short Term Goals: Week 1:  PT Short Term Goal 1 (Week 1): Pt will perform supine<>sit with min assist PT Short Term Goal 2 (Week 1): Pt will consistently perform sit<>stands using LRAD with min assist PT Short Term Goal 3 (Week 1): Pt will perform bed<>chair transfers using LRAD with min assist PT Short Term Goal 4 (Week 1): Pt will ambulate at least 23ft using LRAD with min assist of 1 and +2 for line management as needed PT Short Term Goal 5 (Week 1): Pt will navigate 4 steps using HRs with mod assist of 1 and +2 for safety if needed  Skilled Therapeutic Interventions/Progress Updates:    Pt received supine in bed with his wife, [name] [name], and daughter, [name] [name], present and pt resting in bed, but awakens and is agreeable to therapy session. Pt received on 2.5L of O2 via nasal cannula and transitioned to portable 2L of O2 during session.  Therapist had previously mentioned to pt's family his need for diabetic sneakers prescribed by a podiatrist to protect his feet during functional mobility - pt currently only has fleece lind Crocs - recommended discussing this further with primary PT on Monday to determine optimal shoe selection.  Supine>sitting R EOB, HOB flat but using bedrail, with mod cuing for sequencing and pt having good recall of technique requiring lighter mod assist for B LE management and bringing trunk upright.   Sitting EOB, using B UE support, with SBA while donning shoes total assist.   Sit>stand partially elevated EOB>RW with mod assist for lifting to stand. L stand pivot to w/c using RW with light min assist - min verbal/visual cuing for sequencing turning AD fully.    Transported to/from gym in w/c for time management and energy conservation. Vitals: SpO2 98% and  HR 96bpm  Sit>stand w/c>RW with heavy mod assist for lifting to stand.  Gait training 41ft using RW with min assist of 1 for balance and +2 for O2 line management and w/c follow due to pt fatiguing quickly. Pt demonstrating the following gait deviations with therapist providing the described cuing and facilitation for improvement:  - continues to have severe narrow BOS gait (R LE more adducted and internally rotated than L) - improved step length this afternoon with pt achieving 1/2 reciprocal step length - continues to have forward trunk lean with increased support through B UEs along with downward gaze  - decreased gait speed  Vitals after gait: SpO2 95% and HR 104bpm  Discussed home environment and pt's family confirms that the house is accessible via walker and he has raised toilet seats. Discussed home entry and family reports a ramp can be installed if needed.  Attempted sit>stand w/c>RW x3; however, despite max assist to try and lift into stand, pt unable to successfully power up reporting he is too fatigued to stand nor attempt additional ambulation.   Transported back to his room and pt left seated in w/c with needs in reach, seat belt alarm on, and his wife present.    Therapy Documentation Precautions:  Precautions Precautions: Fall, Other (comment) Precaution Comments: awaiting bilateral 2nd toe amputations, sacral wound, hx of Afib, hx of bil rotator cuff tears Required Braces or Orthoses: Other Brace Other Brace: prevalon boots for skin protection Restrictions Weight Bearing Restrictions: No   Pain:  No complaints of pain throughout session.    Therapy/Group: Individual Therapy  [name] [name] , PT, DPT, NCS, CSRS 12/09/2022, 4:21 PM

## 2022-12-09 NOTE — Progress Notes (Signed)
Patient resting in bed at this time. Denies pain or discomfort. Wife and daughter at bedside, no current questions. TX's completed as ordered. Encouraged fluids.

## 2022-12-10 DIAGNOSIS — J15212 Pneumonia due to Methicillin resistant Staphylococcus aureus: Secondary | ICD-10-CM | POA: Diagnosis not present

## 2022-12-10 DIAGNOSIS — Z1321 Encounter for screening for nutritional disorder: Secondary | ICD-10-CM | POA: Diagnosis not present

## 2022-12-10 DIAGNOSIS — D509 Iron deficiency anemia, unspecified: Secondary | ICD-10-CM | POA: Diagnosis not present

## 2022-12-10 DIAGNOSIS — N179 Acute kidney failure, unspecified: Secondary | ICD-10-CM | POA: Diagnosis not present

## 2022-12-10 MED ORDER — SACCHAROMYCES BOULARDII 250 MG PO CAPS
250.0000 mg | ORAL_CAPSULE | Freq: Two times a day (BID) | ORAL | Status: DC
  Administered 2022-12-10 – 2022-12-23 (×27): 250 mg via ORAL
  Filled 2022-12-10 (×27): qty 1

## 2022-12-10 MED ORDER — VITAMIN D (ERGOCALCIFEROL) 1.25 MG (50000 UNIT) PO CAPS
50000.0000 [IU] | ORAL_CAPSULE | ORAL | Status: DC
  Administered 2022-12-10 – 2022-12-17 (×2): 50000 [IU] via ORAL
  Filled 2022-12-10 (×2): qty 1

## 2022-12-10 MED ORDER — SODIUM CHLORIDE 0.9% FLUSH: 10.0000 mL | INTRAVENOUS | Status: DC | PRN

## 2022-12-10 MED ORDER — SODIUM CHLORIDE 0.9% FLUSH
10.0000 mL | Freq: Two times a day (BID) | INTRAVENOUS | Status: DC
  Administered 2022-12-10 – 2022-12-23 (×19): 10 mL

## 2022-12-10 MED ORDER — BOOST / RESOURCE BREEZE PO LIQD CUSTOM
1.0000 | Freq: Three times a day (TID) | ORAL | Status: DC
  Administered 2022-12-10 – 2022-12-23 (×27): 1 via ORAL
  Filled 2022-12-10 (×6): qty 1

## 2022-12-10 MED ORDER — TRAMADOL HCL 50 MG PO TABS: 50.0000 mg | ORAL_TABLET | Freq: Three times a day (TID) | ORAL | Status: DC | PRN

## 2022-12-10 NOTE — Progress Notes (Signed)
Performed skin assessment: cap refill within 3 seconds, skin color normal, temp was  slightly cool (97.4 F). Injury sites inspected, no further deterioration noted. No abnormality noted for lung sounds, breathing effort was normal. Pulse oximetry within normal range. Slight appetite improvement. Pt was hypotensive when BP reading was acquired. No pain or discomfort was reported.   I attest to this student's documentation.

## 2022-12-10 NOTE — Progress Notes (Signed)
Patient continues to have loose stools. Scheduled stool softeners held. Dr. Dalene Carrow notified. Encouraged fluids. Appetite poor. PICC flushed per order. Denies pain or discomfort.

## 2022-12-10 NOTE — Progress Notes (Addendum)
PROGRESS NOTE   Subjective/Complaints: Sleepy this morning Nurse notes he has had multiple loose stools, miralax d/ced and florastor added Diastolic BP soft  ROS: denies pain, +loose stool   Objective:   No results found. Recent Labs    12/09/22 0330  WBC 9.4  HGB 8.4*  HCT 29.2*  PLT 140*   Recent Labs    12/09/22 0330  NA 142  K 4.0  CL 102  CO2 31  GLUCOSE 110*  BUN 39*  CREATININE 1.61*  CALCIUM 8.5*    Intake/Output Summary (Last 24 hours) at 12/10/2022 1620 Last data filed at 12/10/2022 1400 Gross per 24 hour  Intake 450 ml  Output 900 ml  Net -450 ml     Pressure Injury 11/29/22 Heel Right Stage 3 -  Full thickness tissue loss. Subcutaneous fat may be visible but bone, tendon or muscle are NOT exposed. (Active)  11/29/22 1046  Location: Heel  Location Orientation: Right  Staging: Stage 3 -  Full thickness tissue loss. Subcutaneous fat may be visible but bone, tendon or muscle are NOT exposed.  Wound Description (Comments):   Present on Admission: Yes     Pressure Injury 12/01/22 Buttocks Bilateral Deep Tissue Pressure Injury - Purple or maroon localized area of discolored intact skin or blood-filled blister due to damage of underlying soft tissue from pressure and/or shear. (Active)  12/01/22 1745  Location: Buttocks  Location Orientation: Bilateral  Staging: Deep Tissue Pressure Injury - Purple or maroon localized area of discolored intact skin or blood-filled blister due to damage of underlying soft tissue from pressure and/or shear.  Wound Description (Comments):   Present on Admission:      Pressure Injury 12/04/22 Coccyx Stage 2 -  Partial thickness loss of dermis presenting as a shallow open injury with a red, pink wound bed without slough. (Active)  12/04/22 2010  Location: Coccyx  Location Orientation:   Staging: Stage 2 -  Partial thickness loss of dermis presenting as a shallow open  injury with a red, pink wound bed without slough.  Wound Description (Comments):   Present on Admission: Yes     Pressure Injury 12/04/22 Buttocks Bilateral Stage 2 -  Partial thickness loss of dermis presenting as a shallow open injury with a red, pink wound bed without slough. (Active)  12/04/22 2010  Location: Buttocks  Location Orientation: Bilateral  Staging: Stage 2 -  Partial thickness loss of dermis presenting as a shallow open injury with a red, pink wound bed without slough.  Wound Description (Comments):   Present on Admission: Yes    Physical Exam: Vital Signs Blood pressure (!) 132/52, pulse 87, temperature 97.9 F (36.6 C), resp. rate 17, height 5\' 10"  (1.778 m), weight 115 kg, SpO2 100 %. Gen: no distress, normal appearing HEENT: oral mucosa pink and moist, NCAT Cardio: Reg rate Chest: normal effort, normal rate of breathing Abd: soft, non-distended GU: + external Foley, draining clear urine.  Skin: Multiple wounds + Sacral PI, Stage 4 + R heel PI, unstageable + R 2nd toe extruded bone; no apparent drainage + L second toe wound with clean base, bloody drainage + B/l shin wounds, healing  MSK:      No apparent deformity. BL UE shoulder abduction limited to <90 degrees d/t bilateral rotator cuff tears.      Strength:                RUE: 3/5 SA, 3/5 EF, 3/5 EE, 4/5 WE, 4/5 FF, 4/5 FA                 LUE: 3/5 SA, 3/5 EF, 3/5 EE, 4/5 WE, 4/5 FF, 4/5 FA                 RLE: 2/5 HF, 4/5 KE, 4/5 DF, 4/5 PF                 LLE:  2/5 HF, 4/5 KE, 4/5 DF, 4/5 PF    Neurologic exam:  Cognition: AAO to person, place, time and event.  Language: Fluent, No substitutions or neoglisms. +Hypophonic, breathy vocalizations Memory: Recalls 2/3 objects at 5 minutes.  Insight: Poor insight into current condition.  Mood: Flat affect, appropriate mood.  Sensation: To light touch reduced in bilateral LEs Reflexes: 2+ in BL UE and Les   CN: 2-12 grossly intact.  Coordination: R>L UE intention tremors Spasticity: MAS 0 in all extremities.    Assessment/Plan: 1. Functional deficits which require 3+ hours per day of interdisciplinary therapy in a comprehensive inpatient rehab setting. Physiatrist is providing close team supervision and 24 hour management of active medical problems listed below. Physiatrist and rehab team continue to assess barriers to discharge/monitor patient progress toward functional and medical goals  Care Tool:  Bathing    Body parts bathed by patient: Abdomen, Face, Chest   Body parts bathed by helper: Front perineal area, Buttocks, Right upper leg, Left upper leg, Right lower leg, Left lower leg, Right arm, Left arm     Bathing assist Assist Level: Maximal Assistance - Patient 24 - 49%     Upper Body Dressing/Undressing Upper body dressing   What is the patient wearing?: Pull over shirt    Upper body assist Assist Level: Maximal Assistance - Patient 25 - 49%    Lower Body Dressing/Undressing Lower body dressing      What is the patient wearing?: Pants     Lower body assist Assist for lower body dressing: Total Assistance - Patient < 25%     Toileting Toileting Toileting Activity did not occur Press photographer and hygiene only): N/A (no void or bm)  Toileting assist       Transfers Chair/bed transfer  Transfers assist     Chair/bed transfer assist level: Dependent - mechanical lift (stedy) Chair/bed transfer assistive device: Walker, Archivist   Ambulation assist      Assist level: 2 helpers (min A of 1 and +2 w/c follow - severe gait deviations) Assistive device: Walker-rolling Max distance: 67ft   Walk 10 feet activity   Assist     Assist level: 2 helpers Assistive device: Walker-rolling   Walk 50 feet activity   Assist Walk 50 feet with 2 turns activity did not occur: Safety/medical concerns         Walk 150 feet  activity   Assist Walk 150 feet activity did not occur: Safety/medical concerns         Walk 10 feet on uneven surface  activity   Assist Walk 10 feet on uneven surfaces activity did not occur: Safety/medical concerns         Wheelchair  Assist Is the patient using a wheelchair?: Yes      Wheelchair assist level: Dependent - Patient 0%      Wheelchair 50 feet with 2 turns activity    Assist        Assist Level: Dependent - Patient 0%   Wheelchair 150 feet activity     Assist      Assist Level: Dependent - Patient 0%   Blood pressure (!) 132/52, pulse 87, temperature 97.9 F (36.6 C), resp. rate 17, height 5\' 10"  (1.778 m), weight 115 kg, SpO2 100 %.  Medical Problem List and Plan: 1. Functional deficits secondary to debility from loculated pleural effusion and MRSA pneumonia             -patient may shower             -ELOS/Goals: 7 to 10 days, PT/OT SPV   2.  Antithrombotics: -DVT/anticoagulation:  Pharmaceutical: Eliquis             -antiplatelet therapy: Aspirin    3. Pain Management: Tylenol, tramadol as needed   4. Mood/Behavior/Sleep: LCSW to evaluate and provide emotional support             -antipsychotic agents: n/a   5. Neuropsych/cognition: This patient is not capable of making decisions on his own behalf.   6. Skin/Wound Care: Routine skin care checks             -local care to sacrum, bilateral toes (Dr. Lajoyce Corners)             -continue pressure relief/Prevalon boots/mattress             - Added multivitamin with minerals and ascorbic acid 500 mg daily for wound healing on admission   7. Fluids/Electrolytes/Nutrition/Malnutrition: Routine Is and Os and follow-up chemistries             - Poor POS, no appetite; Ensure TID, consider Marinol    8: Hypotension: monitor TID and prn             -continue midodrine    9: Hyperlipidemia: continue statin   10: Urinary retention: continue flomax since still having urgency   11:  combined chronic systolic/diastolic heart failure: daily weight             -continue Pacerone 200 mg daily             -on no diuretics   12: chronic atrial fibrillation: rate controlled             -on Eliquis, Pacerone   13: Pneumonia with effusion/MRSA sepsis:             -continue Zyvox for 4 weeks             -O2 via [place] at 2 L             -BiPAP as needed/consult respiratory therapy   14: s/p AVR, stable thoracic aortic aneurysm: follow-up outpatient   15: AKI atop CKD IIIb: (baseline 1.2)             -slowing improving, encourage adequate hydration   16: Chronic anemia/iron deficiency: f/u CBC on Mondays. Iron supplement started   17: Hard of hearing: wife to bring in hearing aids  33. Suboptimal vitamin D: started ergocalciferol 50,000U one per week for 7 weeks  19. Loose stool: florastor added  20. Constipation: miralax d/ced     LOS: 2 days A FACE TO FACE EVALUATION WAS PERFORMED  Clint Bolder P Rielle Schlauch 12/10/2022, 4:20 PM

## 2022-12-10 NOTE — Progress Notes (Cosign Needed)
Performed skin assessment: cap refill within 3 seconds, skin color normal, temp was  slightly cool (97.4 F). Injury sites inspected, no further deterioration noted. No abnormality noted for lung sounds, breathing effort was normal. Pulse oximetry within normal range. Slight appetite improvement. Pt was hypotensive when BP reading was acquired. No pain or discomfort was reported.

## 2022-12-11 DIAGNOSIS — R5381 Other malaise: Secondary | ICD-10-CM | POA: Diagnosis not present

## 2022-12-11 LAB — CBC
HCT: 27.6 % — ABNORMAL LOW (ref 39.0–52.0)
Hemoglobin: 7.9 g/dL — ABNORMAL LOW (ref 13.0–17.0)
MCH: 28.6 pg (ref 26.0–34.0)
MCHC: 28.6 g/dL — ABNORMAL LOW (ref 30.0–36.0)
MCV: 100 fL (ref 80.0–100.0)
Platelets: 141 10*3/uL — ABNORMAL LOW (ref 150–400)
RBC: 2.76 MIL/uL — ABNORMAL LOW (ref 4.22–5.81)
RDW: 18.8 % — ABNORMAL HIGH (ref 11.5–15.5)
WBC: 8 10*3/uL (ref 4.0–10.5)
nRBC: 0 % (ref 0.0–0.2)

## 2022-12-11 LAB — BASIC METABOLIC PANEL
Anion gap: 9 (ref 5–15)
BUN: 35 mg/dL — ABNORMAL HIGH (ref 8–23)
CO2: 30 mmol/L (ref 22–32)
Calcium: 8.4 mg/dL — ABNORMAL LOW (ref 8.9–10.3)
Chloride: 103 mmol/L (ref 98–111)
Creatinine, Ser: 1.51 mg/dL — ABNORMAL HIGH (ref 0.61–1.24)
GFR, Estimated: 46 mL/min — ABNORMAL LOW (ref >60)
Glucose, Bld: 100 mg/dL — ABNORMAL HIGH (ref 70–99)
Potassium: 3.8 mmol/L (ref 3.5–5.1)
Sodium: 142 mmol/L (ref 135–145)

## 2022-12-11 MED ORDER — PROSOURCE PLUS PO LIQD
30.0000 mL | Freq: Two times a day (BID) | ORAL | Status: DC
  Administered 2022-12-11 – 2022-12-23 (×19): 30 mL via ORAL
  Filled 2022-12-11 (×17): qty 30

## 2022-12-11 NOTE — Progress Notes (Signed)
Inpatient Rehabilitation Care Coordinator Assessment and Plan Patient Details  Name: [name] [name] MRN: [id number] Date of Birth: 1941/01/14  Today's Date: 12/11/2022  Hospital Problems: Principal Problem:   Pneumonia  Past Medical History:  Past Medical History:  Diagnosis Date   Acute on chronic diastolic heart failure (HCC) 08/22/2013   Acute on chronic respiratory failure with hypoxia (HCC) 10/03/2019   Acute on chronic systolic heart failure (HCC) 05/11/2013   EF from 35% to 50-55% on 6/14 ECHO.    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 No evidence of dissection or endoleak.  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 No evidence of abdominal aortic dissection.  Occluded proximal left subclav   Aortic valve insufficiency    Arthritis    "probably in my knees before they replaced them" (01/28/2015)   Atrial fibrillation (HCC) 05/21/2013   Atrial flutter (HCC)    Bradycardia 08/23/2013   CAD (coronary artery disease)    Chest pain 01/28/2015   CHF (congestive heart failure) (HCC)    Chronic diastolic heart failure (HCC) 01/28/2015   Descending aortic aneurysm (HCC)    DVT (deep venous thrombosis) (HCC)    ?LLE post knee surgery   Dyspnea 09/2019   HCAP (healthcare-associated pneumonia) 10/11/2013   History of blood transfusion    "after one of my knee surgeries"   HTN (hypertension)    Hyperlipidemia    Hypotension 12/17/2019   Incarcerated left inguinal hernia s/p repair 12/02/2019 12/01/2019   Insomnia 09/04/2013   Multiple fractures of ribs, right side, init for clos fx 09/04/2019   Near syncope  01/28/2015   Pressure injury of skin 10/06/2019   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 12/18/2019 12/17/2019   Recurrent UTI 10/06/2013   Shock circulatory (HCC) 10/07/2013   Thoracic aneurysm    Tobacco abuse    Urinary retention 10/06/2013   UTI (urinary tract infection) 10/06/2013   Past Surgical History:  Past Surgical History:  Procedure Laterality Date   AORTIC VALVE REPLACEMENT  02/19/2009   AORTO-FEMORAL BYPASS GRAFT  03/2010   ascending aortic and arch aneurysm repair/notes 04/09/2009   CARDIAC VALVE REPLACEMENT     CAROTID-SUBCLAVIAN BYPASS GRAFT Left 08/26/2013   Procedure: BYPASS GRAFT CAROTID-SUBCLAVIAN;  Surgeon: Nada Libman, MD;  Location: [facility] OR;  Service: Vascular;  Laterality: Left;   CATARACT EXTRACTION W/ INTRAOCULAR LENS  IMPLANT, BILATERAL Bilateral    ENDOVASCULAR STENT INSERTION N/A 08/26/2013   Procedure:  THORACIC STENT GRAFT INSERTION;  Surgeon: Nada Libman, MD;  Location: [facility] OR;  Service: Vascular;  Laterality: N/A;   EYE SURGERY     INGUINAL HERNIA REPAIR Left 12/02/2019   Procedure: REPAIR LEFT INGUINAL HERNIA WITH MESH;  Surgeon: Violeta Gelinas, MD;  Location: [facility] OR;  Service: General;  Laterality: Left;   INGUINAL HERNIA REPAIR Left 12/18/2019   Procedure: LAPAROSCOPIC ASSISTED REPAIR OF RECURRENT INCARCERATED LEFT INGUINAL HERNIA WITH MESH; LAPAOSCOPIC REPAIR OF SEROSAL TEAR;  Surgeon: Gaynelle Adu, MD;  Location: [facility] OR;  Service: General;  Laterality: Left;   INSERTION OF MESH Left 12/02/2019   Procedure: INSERTION OF MESH;  Surgeon: Violeta Gelinas, MD;  Location: [facility] OR;  Service: General;  Laterality: Left;   JOINT REPLACEMENT     KNEE ARTHROSCOPY Bilateral    REPLACEMENT TOTAL KNEE Bilateral    right groin lymphocele Right 03/29/2009   RIGHT/LEFT HEART CATH AND CORONARY ANGIOGRAPHY N/A 10/06/2019   Procedure: RIGHT/LEFT HEART CATH AND CORONARY ANGIOGRAPHY;  Surgeon: Lyn Records, MD;  Location: [facility] INVASIVE CV LAB;   Service: Cardiovascular;  Laterality: N/A;   THORACENTESIS  2010 X 2   TONSILLECTOMY     Social History:  reports that he quit smoking about 48 years ago. His smoking use included cigarettes. He has never used smokeless tobacco. He reports that he does not drink alcohol and does not use drugs.  Family / Support Systems Marital Status: Married Patient Roles: Spouse Spouse/Significant Other: Kimberly Coye, spouse Children: Lawson Fiscal, daughter Other Supports: N/A Anticipated Caregiver: [name], spouse Ability/Limitations of Caregiver: Min A/Supervision Caregiver Availability: 24/7 Family Dynamics: Support from spouse and daughter  Social History Preferred language: English Religion: [employer] Education: HS Health Literacy - How often do you need to have someone help you when you read instructions, pamphlets, or other written material from your doctor or pharmacy?: Often Writes: No Employment Status: Retired Marine scientist Issues: n/a Guardian/Conservator: Isabella Bowens   Abuse/Neglect Abuse/Neglect Assessment Can Be Completed: Yes Physical Abuse: Denies Verbal Abuse: Denies Sexual Abuse: Denies Exploitation of patient/patient's resources: Denies Self-Neglect: Denies  Patient response to: Social Isolation - How often do you feel lonely or isolated from those around you?: Never  Emotional Status [name] affect, behavior and adjustment status: Pleasant and spouse at bedside assisting with meal. Recent Psychosocial Issues: Copig Psychiatric History: coping Substance Abuse History: hx of tobacco abuse  Patient / Family Perceptions, Expectations & Goals Pt/Family understanding of illness & functional limitations: yes and spouse at bedside Premorbid pt/family roles/activities: Independent Anticipated changes in roles/activities/participation: Spouse anticipating assisting Sup/min A and able to supervise 24/7. Pt/family expectations/goals: [employer] A  [employer]: None Premorbid Home Care/DME Agencies: Other (Comment) Adult nurse, Information systems manager, Single [employer] and [employer]) Transportation available at discharge: spouse able to transport Is the patient able to respond to transportation needs?: Yes In the past 12 months, has lack of transportation kept you from medical appointments or from getting medications?: No In the past 12 months, has lack of transportation kept you from meetings, work, or from getting things needed for daily living?: No Resource referrals recommended: Neuropsychology  Discharge Planning Living Arrangements: Alone, Other relatives Support Systems: Spouse/significant other, Children Type of Residence: Private residence (1 level mobile home, 4 steps) Insurance Resources: Electrical engineer Resources: Tree surgeon, [employer] Financial Screen Referred: No Living Expenses: Lives with family Money Management: Patient, Spouse Does the patient have any problems obtaining your medications?: No Home Management: Independent Patient/Family Preliminary Plans: Spouse able to assist with cogntive tasks Care Coordinator Anticipated Follow Up Needs: HH/OP Expected length of stay: 7-10 Days  Clinical Impression Sw met with patient and spouse, introduced self and explain role. Patient discharging back home with his spouse to supervise and assist. Patient and spouse live in a 1 level mobile home with 4 steps to enter. Spouse expressed that they currently have a rolling walker, rollator, shower seat and single point cane. Patient currently being seen by wound care. No additional questions or concerns.  Andria Rhein 12/11/2022, 1:11 PM

## 2022-12-11 NOTE — Progress Notes (Signed)
Had a had time falling asleep despite receiving melatonin. Remains alert and oriented to self. Confused at times but easy to orient. Continent of B/B. Dressing changes to sacrum changed X2. Able to communicate needs. Safety maintained.

## 2022-12-11 NOTE — Progress Notes (Signed)
Patient ID: [name] [name], male   DOB: 1940/11/07, 82 y.o.   MRN: [id number] Met with the patient and wife to review current situation, rehab process, team conference and plan of care. Discussed medications for secondary risk management including  A-Fib, CKD, Anemia. Skin/wound care addressed; encouraged increased po intake, protein. Prosource added twice a day between meals with poor appetiete and ensure changed to Boost Breeze per preference.  Continue wound treatment plan; concerned about shoes as [employer] caused wound on his foot. Continue MRSA pna precautions. Notes runny nose; on Xyzal PTA; claritin on profile. Continue to follow along to address educational needs to facilitate preparation for discharge. Pamelia Hoit

## 2022-12-11 NOTE — Progress Notes (Signed)
Physical Therapy Session Note  Patient Details  Name: [name] [name] MRN: [id number] Date of Birth: [birth year]  Today's Date: 12/11/2022 PT Individual Time: [phone] and 1430-1455 PT Individual Time Calculation (min): 72 min and 25 min  Short Term Goals: Week 1:  PT Short Term Goal 1 (Week 1): Pt will perform supine<>sit with min assist PT Short Term Goal 2 (Week 1): Pt will consistently perform sit<>stands using LRAD with min assist PT Short Term Goal 3 (Week 1): Pt will perform bed<>chair transfers using LRAD with min assist PT Short Term Goal 4 (Week 1): Pt will ambulate at least 53ft using LRAD with min assist of 1 and +2 for line management as needed PT Short Term Goal 5 (Week 1): Pt will navigate 4 steps using HRs with mod assist of 1 and +2 for safety if needed  Skilled Therapeutic Interventions/Progress Updates:   Treatment Session 1 Received pt semi-reclined in bed, pt agreeable to PT treatment, and reported pain in bilateral heels (unrated). Pt on 2.5L O2 via nasal cannula with SPO2 96-99%, decreased to 2L then to 1L and SPO2 96% but HR remained elevated (108bpm at rest) - per RN, MD ordering EKG today with concern for A-Fib. Session with emphasis on functional mobility/transfers, generalized strengthening and endurance, dynamic standing balance/coordination, toileting, dressing, and gait training. While RN administered medication and MD arrived for morning rounds, therapist located Roho cushion for pressure relief and improved skin integrity. Pt transferred semi-reclined<>sitting EOB with HOB elevated and use of bedrails with supervision. Pt stood from elevated EOB with RW and mod A and performed stand<>pivot transfer to [facility] with RW and min A.   Pt transported to/from room in [facility] dependently for energy conservation purposes. Pt required x 2 attempts and heavy mod A to stand from [facility] with RW. Pt amulated 38ft with RW and min A (+2 for WC follow due to quick onset of fatigue) - SPO2  dropped to 90% with gait. Pt required cues to widen BOS and for upright posture. Pt politely declined any further ambulation due to fatigue and reported urge to toilet. Returned to room and transferred to/from toilet with bedside commode over top with RW and mod A to stand (min A for stand<>pivot). Pt with loose BM and dependent for peri-care in standing. Doffed dirty brief/shorts and donned clean brief/shorts with max A. Set pt up to eat breakfast and concluded session with pt sitting in [facility] and needs within reach. SPO2 95% on 1L and HR 114bpm but pt reporting SOB - RN/NT notified of pt's current status.  Treatment Session 2 Received pt sitting in [facility] with wife present at bedside. Pt agreeable to PT treatment and denied any pain but requested to return to bed due to fatigue. Pt on RA with SPO2 98-100%. Pt stood from [facility] with RW and mod A and transferred WC<>bed stand<>pivot with RW and CGA. Pt transferred sit<>supine with mod A for BLE management and required +2 assist to scoot to [facility] due to inability to use arms from bilateral RTC tears and inability to press through heels to boost up. Pt required mod A to reposition in midline and donned bilateral Prevalon boots with total A for pressure relief to heels. Briefly discussed ordering Roho cushion for pt at home and concluded session with pt semi-reclined in bed, needs within reach, and bed alarm on. Pt left on RA with SPO2 95% - RN aware.   Therapy Documentation Precautions:  Precautions Precautions: Fall, Other (comment) Precaution Comments: awaiting bilateral  2nd toe amputations, sacral wound, hx of Afib, hx of bil rotator cuff tears Required Braces or Orthoses: Other Brace Other Brace: prevalon boots for skin protection Restrictions Weight Bearing Restrictions: No  Therapy/Group: Individual Therapy [name] [name] PT, DPT 12/11/2022, 6:52 AM

## 2022-12-11 NOTE — Progress Notes (Signed)
Inpatient Rehabilitation Center Individual Statement of Services  Patient Name:  [name] [name]  Date:  12/11/2022  Welcome to the Inpatient Rehabilitation Center.  Our goal is to provide you with an individualized program based on your diagnosis and situation, designed to meet your specific needs.  With this comprehensive rehabilitation program, you will be expected to participate in at least 3 hours of rehabilitation therapies Monday-Friday, with modified therapy programming on the weekends.  Your rehabilitation program will include the following services:  Physical Therapy (PT), Occupational Therapy (OT), Speech Therapy (ST), 24 hour per day rehabilitation nursing, Therapeutic Recreaction (TR), Neuropsychology, Care Coordinator, Rehabilitation Medicine, Nutrition Services, Pharmacy Services, and Other  Weekly team conferences will be held on Wednesdays to discuss your progress.  Your Inpatient Rehabilitation Care Coordinator will talk with you frequently to get your input and to update you on team discussions.  Team conferences with you and your family in attendance may also be held.  Expected length of stay: 7-10 Days  Overall anticipated outcome:  Supervision  Depending on your progress and recovery, your program may change. Your Inpatient Rehabilitation Care Coordinator will coordinate services and will keep you informed of any changes. Your Inpatient Rehabilitation Care Coordinator's name and contact numbers are listed  below.  The following services may also be recommended but are not provided by the Inpatient Rehabilitation Center:   Home Health Rehabiltiation Services Outpatient Rehabilitation Services    Arrangements will be made to provide these services after discharge if needed.  Arrangements include referral to agencies that provide these services.  Your insurance has been verified to be:   Medicare A & B Your primary doctor is:  Street, Corporate treasurer, MD  Pertinent  information will be shared with your doctor and your insurance company.  Inpatient Rehabilitation Care Coordinator:  Lavera Guise, [place] [phone] or [phone]  Information discussed with and copy given to patient by: Andria Rhein, 12/11/2022, 10:32 AM

## 2022-12-11 NOTE — Progress Notes (Signed)
Pt has PRN Bipap orders, no distress noted at this time.  

## 2022-12-11 NOTE — Progress Notes (Signed)
Occupational Therapy Session Note  Patient Details  Name: [name] [name] MRN: [id number] Date of Birth: 03/22/1941  Today's Date: 12/11/2022 OT Individual Time: 2595-6387 & 5643-3295 OT Individual Time Calculation (min): 55 min & 40 min   Short Term Goals: Week 1:  OT Short Term Goal 1 (Week 1): Pt will complete sit<>stand during self-care task with mod assist. OT Short Term Goal 2 (Week 1): To promote participation in ADLs, pt will engage in functional activity in standing for 2+ min without rest break. OT Short Term Goal 3 (Week 1): Pt will complete toilet transfer with mod assist. OT Short Term Goal 4 (Week 1): Pt will complete UB dressing with min assist.  Skilled Therapeutic Interventions/Progress Updates:  Session 1 Skilled OT intervention completed with focus on ADL retraining. Pt received seated in w/c with wife present, agreeable to session. No pain reported however wincing with shoulder AROM; pre-medicated. OT offered rest breaks, repositioning and modifications throughout for pain reduction.  OT addressed the following concerns with PA, MD and nursing during session: -No present off loading (for heel wound) or toe protective shoe (for bony protrusion) in room or ordered with request to have one ordered for foot integrity -No current orders for O2 however pt on 1L with 93% sat with request for new orders -Tachycardic at rest (112 bpm) without activity -L lateral skin tear with bleeding through current gauze with attention needed for wound care; nurse planning to change at end of session  When prompted about self-care needs pt indicated need to void. Transported pt dependently in w/c > BSC over toilet in bathroom. Mod A sit > stand using grab bar (though difficulty reaching BUE due to shoulder limitations) then min A stand pivot to commode with mod cues and management of O2 line. Max A to doff pants down. Continent of void/small loose BM. Stood with min A using grab bar. 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 Min A stand pivot to w/c.   Wife assisted pt with UB ADLs at sink per baseline; doffed shirt with max A, washed with max A, and donned new shirt with max A, however could be less.   Completed 2x12 bicep curls with 2 lb dowel for general strengthening. Pt remained seated in w/c, with wife present therefore alarm left off, and with all needs in reach at end of session.  Vitals -Received on 0.5L via [place], SPO2 93%, HR 112 bpm -95% after activity on 1L portable tank  Session 2 Skilled OT intervention completed with focus on dynamic balance, general endurance and standing tolerance. Pt received seated in w/c, agreeable to session. No pain reported.  Transported dependently in w/c <> gym for energy conservation. Pt then participated in dynamic standing balance, standing tolerance and gentle BUE AROM activity with removing squigz (1 trial each hand) from long mirror. Required 2 trials to power up, with min A needed to stand using RW with mod cues for "nose over toes" for efficiency. Able to maintain stance with CGA using RW, and tolerated stance for several mins, however SOB noted (see below for vitals). Extended seated rest breaks needed for fatigue.  Back in room, pt remained seated in w/c, with belt alarm on/activated, and with all needs in reach at end of session.  Vitals -Received on 0.5L via [place], SPO2 97% -95% after activity on  0.5L -Assessed ability to titrate O2, however elevated HR and desat to 92% and lower, with RA    Therapy Documentation Precautions:  Precautions Precautions: Fall, Other (comment) Precaution Comments: awaiting bilateral 2nd toe amputations, sacral wound, hx of Afib, hx of bil rotator cuff tears Required Braces or Orthoses: Other Brace Other Brace: prevalon boots for skin  protection Restrictions Weight Bearing Restrictions: No    Therapy/Group: Individual Therapy  Melvyn Novas, MS, OTR/L  12/11/2022, 3:30 PM

## 2022-12-11 NOTE — Consult Note (Addendum)
WOC consult requested for sacrum and bilat legs. This was already performed on 6/12 and again on 6/18; please refer to previous progress notes for assessment and plan of care. Topical treatment orders have been provided for bedside nurses to perform. Please re-consult if further assistance is needed.  Thank-you,  Cammie Mcgee MSN, RN, CWOCN, [place], CNS [phone]

## 2022-12-11 NOTE — Progress Notes (Signed)
PROGRESS NOTE   Subjective/Complaints:  Pt reports breathing well on 2.5L O2 Already on cymbalta 60 mg BID LBM yesterday- denies constipation.   Says only hurts in B/L heels where "has wounds".    ROS: denies Afib and c/o B/L heel pain; "normal stool" yesterday afternoon Objective:   No results found. Recent Labs    12/09/22 0330 12/11/22 0339  WBC 9.4 8.0  HGB 8.4* 7.9*  HCT 29.2* 27.6*  PLT 140* 141*   Recent Labs    12/09/22 0330 12/11/22 0339  NA 142 142  K 4.0 3.8  CL 102 103  CO2 31 30  GLUCOSE 110* 100*  BUN 39* 35*  CREATININE 1.61* 1.51*  CALCIUM 8.5* 8.4*    Intake/Output Summary (Last 24 hours) at 12/11/2022 1814 Last data filed at 12/11/2022 1220 Gross per 24 hour  Intake 297 ml  Output 850 ml  Net -553 ml     Pressure Injury 11/29/22 Heel Right Stage 3 -  Full thickness tissue loss. Subcutaneous fat may be visible but bone, tendon or muscle are NOT exposed. (Active)  11/29/22 1046  Location: Heel  Location Orientation: Right  Staging: Stage 3 -  Full thickness tissue loss. Subcutaneous fat may be visible but bone, tendon or muscle are NOT exposed.  Wound Description (Comments):   Present on Admission: Yes     Pressure Injury 12/01/22 Buttocks Bilateral Deep Tissue Pressure Injury - Purple or maroon localized area of discolored intact skin or blood-filled blister due to damage of underlying soft tissue from pressure and/or shear. present on admission to rehab (Active)  12/01/22 1745  Location: Buttocks  Location Orientation: Bilateral  Staging: Deep Tissue Pressure Injury - Purple or maroon localized area of discolored intact skin or blood-filled blister due to damage of underlying soft tissue from pressure and/or shear.  Wound Description (Comments): present on admission to rehab  Present on Admission: Yes     Pressure Injury 12/04/22 Coccyx Stage 2 -  Partial thickness loss of dermis  presenting as a shallow open injury with a red, pink wound bed without slough. (Active)  12/04/22 2010  Location: Coccyx  Location Orientation:   Staging: Stage 2 -  Partial thickness loss of dermis presenting as a shallow open injury with a red, pink wound bed without slough.  Wound Description (Comments):   Present on Admission: Yes     Pressure Injury 12/04/22 Buttocks Bilateral Stage 2 -  Partial thickness loss of dermis presenting as a shallow open injury with a red, pink wound bed without slough. (Active)  12/04/22 2010  Location: Buttocks  Location Orientation: Bilateral  Staging: Stage 2 -  Partial thickness loss of dermis presenting as a shallow open injury with a red, pink wound bed without slough.  Wound Description (Comments):   Present on Admission: Yes    Physical Exam: Vital Signs Blood pressure 132/67, pulse 82, temperature 98.2 F (36.8 C), temperature source Oral, resp. rate 17, height 5\' 10"  (1.778 m), weight 119 kg, SpO2 99 %.    General: awake, alert, calm; sitting up in bed with nursing and therapy in room; NAD HENT: conjugate gaze; oropharynx moist- [place] O2 2.5L CV: irregular  rhythm; regular rate; no JVD Pulmonary: decreased at bases- a few wheezes- rare B/L GI: soft, NT, ND, (+)BS- normoactive Psychiatric: calm Neurological: poor historian figured out after pt seen- appeared rational, but was confused about medical issues  Skin: Multiple wounds + Sacral PI, Stage 4 + R heel PI, unstageable + R 2nd toe extruded bone; no apparent drainage + L second toe wound with clean base, bloody drainage + B/l shin wounds, healing                                 MSK:      No apparent deformity. BL UE shoulder abduction limited to <90 degrees d/t bilateral rotator cuff tears.      Strength:                RUE: 3/5 SA, 3/5 EF, 3/5 EE, 4/5 WE, 4/5 FF, 4/5 FA                 LUE: 3/5 SA, 3/5 EF, 3/5 EE, 4/5 WE, 4/5 FF, 4/5 FA                 RLE: 2/5  HF, 4/5 KE, 4/5 DF, 4/5 PF                 LLE:  2/5 HF, 4/5 KE, 4/5 DF, 4/5 PF    Neurologic exam:  Cognition: AAO to person, place, time and event.  Language: Fluent, No substitutions or neoglisms. +Hypophonic, breathy vocalizations Memory: Recalls 2/3 objects at 5 minutes.  Insight: Poor insight into current condition.  Mood: Flat affect, appropriate mood.  Sensation: To light touch reduced in bilateral LEs Reflexes: 2+ in BL UE and Les  CN: 2-12 grossly intact.  Coordination: R>L UE intention tremors Spasticity: MAS 0 in all extremities.    Assessment/Plan: 1. Functional deficits which require 3+ hours per day of interdisciplinary therapy in a comprehensive inpatient rehab setting. Physiatrist is providing close team supervision and 24 hour management of active medical problems listed below. Physiatrist and rehab team continue to assess barriers to discharge/monitor patient progress toward functional and medical goals  Care Tool:  Bathing    Body parts bathed by patient: Abdomen, Face, Chest   Body parts bathed by helper: Front perineal area, Buttocks, Right upper leg, Left upper leg, Right lower leg, Left lower leg, Right arm, Left arm     Bathing assist Assist Level: Maximal Assistance - Patient 24 - 49%     Upper Body Dressing/Undressing Upper body dressing   What is the patient wearing?: Pull over shirt    Upper body assist Assist Level: Maximal Assistance - Patient 25 - 49%    Lower Body Dressing/Undressing Lower body dressing      What is the patient wearing?: Pants     Lower body assist Assist for lower body dressing: Total Assistance - Patient < 25%     Toileting Toileting Toileting Activity did not occur Press photographer and hygiene only): N/A (no void or bm)  Toileting assist Assist for toileting: Maximal Assistance - Patient 25 - 49%     Transfers Chair/bed transfer  Transfers assist     Chair/bed transfer assist level: Minimal  Assistance - Patient > 75% Chair/bed transfer assistive device: Geologist, engineering   Ambulation assist      Assist level: 2 helpers Assistive device: Walker-rolling Max distance: 63ft   Walk 10 feet  activity   Assist     Assist level: 2 helpers Assistive device: Walker-rolling   Walk 50 feet activity   Assist Walk 50 feet with 2 turns activity did not occur: Safety/medical concerns         Walk 150 feet activity   Assist Walk 150 feet activity did not occur: Safety/medical concerns         Walk 10 feet on uneven surface  activity   Assist Walk 10 feet on uneven surfaces activity did not occur: Safety/medical concerns         Wheelchair     Assist Is the patient using a wheelchair?: Yes Type of Wheelchair: Manual    Wheelchair assist level: Dependent - Patient 0%      Wheelchair 50 feet with 2 turns activity    Assist        Assist Level: Dependent - Patient 0%   Wheelchair 150 feet activity     Assist      Assist Level: Dependent - Patient 0%   Blood pressure 132/67, pulse 82, temperature 98.2 F (36.8 C), temperature source Oral, resp. rate 17, height 5\' 10"  (1.778 m), weight 119 kg, SpO2 99 %.  Medical Problem List and Plan: 1. Functional deficits secondary to debility from loculated pleural effusion and MRSA pneumonia             -patient may shower             -ELOS/Goals: 7 to 10 days, PT/OT SPV   6/24- con't CIR PT and OT 2.  Antithrombotics: -DVT/anticoagulation:  Pharmaceutical: Eliquis             -antiplatelet therapy: Aspirin    3. Pain Management: Tylenol, tramadol as needed   6/24- denies pain except heels B/L  4. Mood/Behavior/Sleep: LCSW to evaluate and provide emotional support             -antipsychotic agents: n/a   5. Neuropsych/cognition: This patient is not capable of making decisions on his own behalf.   6. Skin/Wound Care: Routine skin care checks             -local care to  sacrum, bilateral toes (Dr. Lajoyce Corners)             -continue pressure relief/Prevalon boots/mattress             - Added multivitamin with minerals and ascorbic acid 500 mg daily for wound healing on admission   6/24- pt would benefit from a healing shoe for B/L feet/toes wounds- pt also c/o heel pain.  7. Fluids/Electrolytes/Nutrition/Malnutrition: Routine Is and Os and follow-up chemistries             - Poor POS, no appetite; Ensure TID, consider Marinol    8: Hypotension: monitor TID and prn             -continue midodrine    9: Hyperlipidemia: continue statin   10: Urinary retention: continue flomax since still having urgency   11: combined chronic systolic/diastolic heart failure: daily weight             -continue Pacerone 200 mg daily             -on no diuretics   6/24- weight bumped up dramatically today- from 115 to 119- will recheck in AM before giving diuretics- will verify, since off due to CKD/AKI 12: chronic atrial fibrillation: rate controlled             -on  Eliquis, Pacerone   6/24- pt told me this AM- never had Afib/irregular heart rate- did research to find he does have it- and pt poor historian 13: Pneumonia with effusion/MRSA sepsis:             -continue Zyvox for 4 weeks             -O2 via [place] at 2 L             -BiPAP as needed/consult respiratory therapy  6/24- will get PICC line out since on oral ABX. Breathing "good" per pt- denies SOB   14: s/p AVR, stable thoracic aortic aneurysm: follow-up outpatient   15: AKI atop CKD IIIb: (baseline 1.2)             -slowing improving, encourage adequate hydration   6/24- Cr 1.51 and BUN 35- down from 39- and Cr down from 1.61/1.71- so downtrending. Con't to monitor 16: Chronic anemia/iron deficiency: f/u CBC on Mondays. Iron supplement started   6/24- HB down to 7.9 from 8.4- will likely benefit from recheck later this week  17: Hard of hearing: wife to bring in hearing aids  18. Suboptimal vitamin D: started  ergocalciferol 50,000U one per week for 7 weeks  19. Loose stool: florastor added  20. Constipation: miralax d/ced   6/24- LBM yesterday- denies constipation onw6 21. Morbid Obesity- BMI 35.74- encourage weight loss     I spent a total of 53   minutes on total care today- >50% coordination of care- due to  Back and forth with team as well as d/w PA and other PA about Afib.  Also IPOC done.   LOS: 3 days A FACE TO FACE EVALUATION WAS PERFORMED  Zebastian Carico 12/11/2022, 6:14 PM

## 2022-12-11 NOTE — Plan of Care (Addendum)
Wound Plan     Wounds present: erythema and now skin sloughing consistent with a DTPI in evolution.  First noted on Day 2 of acute admission on 12/01/22. (Admit to rehab on 12/08/22) Wound type:Pressure Pressure Injury POA: Yes Measurement:11cm x 8cm area of involvement with 4cm linear opening in the gluteal cleft. Bilateral buttock lesions in a butterfly pattern present with purple/maroon discoloration. These areas are expected to open and evolve into full thickness wounds. Wound bed:As described above Drainage (amount, consistency, odor) scant serous to serosanguinous Periwound: intact Interventions: Dressing procedure/placement/frequency: mattress replacement  pressure redistribution chair cushion for use when OOB in chair.  Topical care will be to cleanse daily and apply xeroform (antimicrobial nonadherent) topped with dry gauze and secured with silicone foam.  LE wounds Patient is followed by podiatry in [place] and was scheduled for bilateral 2nd metatarsal amputation today. 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  Pressure Injury POA: Yes Measurement: Less than 1cm; no depth 1cm x 1cm x 0.1cm; dry Right toe; 1cm x 1cm x 0.1cm with bone protruding Left toe: 2cm x 2cm x 0cm; yellow firm wound bed Wound bed:see above  Drainage (amount, consistency, odor) none Periwound: intact   Interventions: Dressing procedure/placement/frequency: Wife provides meticulous care for the patients LE wounds for a number of years.  She watched me perform wound care today Paint all areas with betadine swab daily, cover with dry dressings. Secure with ACE wraps. Change daily. Prevalon boots to offload heels while in the bed.   Additional  interventions: Zinc, Vitamin C, and MVI daily Vitamin D weekly Boost breeze three times/day Heart Healthy diet; eating < 50% of meals  Braden Score: 13 Sensory: 2 Moisture: 3 Activity: 3 Mobility: 2 Nutrition: 2 Friction: 1   Contributors: Ladona Mow, MSN, RN, CNS, GNP, CWOCN, CWON-AP, WOCNF, FAAN  [employer]ody [employer], RN,CWOCN, CNS, CWON-AP  Shearon Stalls, RN Mekides Nida, RN, CRRN, WTA Chana Bode, RN-BC, CRRN

## 2022-12-11 NOTE — Progress Notes (Signed)
Inpatient Rehabilitation  Patient information reviewed and entered into eRehab system by Rolfe Hartsell M. Willard Farquharson, M.A., CCC/SLP, PPS Coordinator.  Information including medical coding, functional ability and quality indicators will be reviewed and updated through discharge.    

## 2022-12-11 NOTE — IPOC Note (Signed)
Overall Plan of Care [facility]) Patient Details Name: [name] [name] MRN: [id number] DOB: Jul 12, 1940  Admitting Diagnosis: Pneumonia  Hospital Problems: Principal Problem:   Pneumonia     Functional Problem List: Nursing Bladder, Bowel, Safety, Endurance, Medication Management, Pain  PT Balance, Perception, Behavior, Safety, Edema, Sensory, Endurance, Skin Integrity, Motor, Nutrition, Pain  OT Balance, Perception, Pain, Safety, Edema, Cognition, Sensory, Endurance, Skin Integrity, Motor, Nutrition  SLP    TR         Basic ADL's: OT Grooming, Bathing, Dressing, Toileting     Advanced  ADL's: OT       Transfers: PT Bed Mobility, Bed to Chair, Car, Oncologist: PT Ambulation, Psychologist, prison and probation services, Stairs     Additional Impairments: OT Fuctional Use of Upper Extremity (limited AROM)  SLP        TR      Anticipated Outcomes Item Anticipated Outcome  Self Feeding N/A  Swallowing      Basic self-care  min A  Toileting  supervision   Bathroom Transfers supervision  Bowel/Bladder  manage bowel and bladder w mod I assist  Transfers  CGA using LRAD  Locomotion  CGA using LRAD  Communication     Cognition     Pain  < 4 with prns  Safety/Judgment  manage w cues   Therapy Plan: PT Intensity: Minimum of 1-2 x/day ,45 to 90 minutes PT Frequency: 5 out of 7 days PT Duration Estimated Length of Stay: 2-2.5 weeks OT Intensity: Minimum of 1-2 x/day, 45 to 90 minutes OT Frequency: 5 out of 7 days OT Duration/Estimated Length of Stay: 14-18 days     Team Interventions: Nursing Interventions Bladder Management, Patient/Family Education, Medication Management, Pain Management, Discharge Planning, Bowel Management, Disease Management/Prevention  PT interventions Ambulation/gait training, Community reintegration, DME/adaptive equipment instruction, Neuromuscular re-education, Psychosocial support, Stair training, UE/LE Strength taining/ROM,  Wheelchair propulsion/positioning, Warden/ranger, Discharge planning, Functional electrical stimulation, Pain management, Skin care/wound management, Therapeutic Activities, UE/LE Coordination activities, Cognitive remediation/compensation, Disease management/prevention, Functional mobility training, Patient/family education, Splinting/orthotics, Therapeutic Exercise, Visual/perceptual remediation/compensation  OT Interventions Warden/ranger, Disease mangement/prevention, Community reintegration, Development worker, international aid stimulation, Patient/family education, Self Care/advanced ADL retraining, Splinting/orthotics, Therapeutic Exercise, UE/LE Coordination activities, Wheelchair propulsion/positioning, Visual/perceptual remediation/compensation, Therapeutic Activities, UE/LE Strength taining/ROM, Skin care/wound managment, Pain management, Psychosocial support, Functional mobility training, Cognitive remediation/compensation, Discharge planning, DME/adaptive equipment instruction  SLP Interventions    TR Interventions    SW/CM Interventions Discharge Planning, Psychosocial Support, Patient/Family Education, Disease Management/Prevention   Barriers to Discharge MD  Medical stability, Home enviroment access/loayout, Incontinence, Wound care, Lack of/limited family support, Weight, Weight bearing restrictions, and New oxygen  Nursing Decreased caregiver support, Home environment access/layout 1 level mobile home, 4ste, right rail w spouse; has RW, SS, SPC, rollator, toilet riser and lift chair  PT Home environment access/layout, New oxygen, Pending surgery, Wound Care, Nutrition means    OT      SLP      SW       Team Discharge Planning: Destination: PT-Home ,OT- Home , SLP-  Projected Follow-up: PT-Home health PT, 24 hour supervision/assistance, OT-  Home health OT, 24 hour supervision/assistance, SLP-  Projected Equipment Needs: PT-To be determined, OT- None recommended by  OT, SLP-  Equipment Details: PT- , OT-Family has multipe equipment items Patient/family involved in discharge planning: PT- Patient, Family member/caregiver,  OT-Patient, Family member/caregiver, SLP-   MD ELOS: 14-18 days Medical Rehab Prognosis:  Good Assessment: The patient has been  admitted for CIR therapies with the diagnosis of debility from recent pneumonia. The team will be addressing functional mobility, strength, stamina, balance, safety, adaptive techniques and equipment, self-care, bowel and bladder mgt, patient and caregiver education, O2 titration. Goals have been set at min A to supervision. Anticipated discharge destination is home with wife.        See Team Conference Notes for weekly updates to the plan of care

## 2022-12-12 ENCOUNTER — Inpatient Hospital Stay (HOSPITAL_COMMUNITY): Payer: Medicare Other

## 2022-12-12 DIAGNOSIS — N179 Acute kidney failure, unspecified: Secondary | ICD-10-CM | POA: Diagnosis not present

## 2022-12-12 DIAGNOSIS — Z1321 Encounter for screening for nutritional disorder: Secondary | ICD-10-CM | POA: Diagnosis not present

## 2022-12-12 DIAGNOSIS — D509 Iron deficiency anemia, unspecified: Secondary | ICD-10-CM | POA: Diagnosis not present

## 2022-12-12 DIAGNOSIS — J15212 Pneumonia due to Methicillin resistant Staphylococcus aureus: Secondary | ICD-10-CM | POA: Diagnosis not present

## 2022-12-12 DIAGNOSIS — J90 Pleural effusion, not elsewhere classified: Secondary | ICD-10-CM | POA: Diagnosis not present

## 2022-12-12 LAB — BLOOD GAS, ARTERIAL
Acid-Base Excess: 4.4 mmol/L — ABNORMAL HIGH (ref 0.0–2.0)
Bicarbonate: 29.2 mmol/L — ABNORMAL HIGH (ref 20.0–28.0)
O2 Saturation: 96.9 %
Patient temperature: 37
pCO2 arterial: 44 mmHg (ref 32–48)
pH, Arterial: 7.43 (ref 7.35–7.45)
pO2, Arterial: 70 mmHg — ABNORMAL LOW (ref 83–108)

## 2022-12-12 LAB — BASIC METABOLIC PANEL
Anion gap: 11 (ref 5–15)
BUN: 35 mg/dL — ABNORMAL HIGH (ref 8–23)
CO2: 26 mmol/L (ref 22–32)
Calcium: 8.6 mg/dL — ABNORMAL LOW (ref 8.9–10.3)
Chloride: 106 mmol/L (ref 98–111)
Creatinine, Ser: 1.48 mg/dL — ABNORMAL HIGH (ref 0.61–1.24)
GFR, Estimated: 47 mL/min — ABNORMAL LOW (ref >60)
Glucose, Bld: 138 mg/dL — ABNORMAL HIGH (ref 70–99)
Potassium: 4.2 mmol/L (ref 3.5–5.1)
Sodium: 143 mmol/L (ref 135–145)

## 2022-12-12 LAB — URINALYSIS, W/ REFLEX TO CULTURE (INFECTION SUSPECTED)
Bilirubin Urine: NEGATIVE
Glucose, UA: NEGATIVE mg/dL
Ketones, ur: NEGATIVE mg/dL
Nitrite: NEGATIVE
Protein, ur: 30 mg/dL — AB
RBC / HPF: >50 RBC/hpf (ref 0–5)
Specific Gravity, Urine: 1.015 (ref 1.005–1.030)
pH: 5 (ref 5.0–8.0)

## 2022-12-12 LAB — CBC
HCT: 29.7 % — ABNORMAL LOW (ref 39.0–52.0)
Hemoglobin: 8.7 g/dL — ABNORMAL LOW (ref 13.0–17.0)
MCH: 28.7 pg (ref 26.0–34.0)
MCHC: 29.3 g/dL — ABNORMAL LOW (ref 30.0–36.0)
MCV: 98 fL (ref 80.0–100.0)
Platelets: 150 10*3/uL (ref 150–400)
RBC: 3.03 MIL/uL — ABNORMAL LOW (ref 4.22–5.81)
RDW: 18.8 % — ABNORMAL HIGH (ref 11.5–15.5)
WBC: 7.5 10*3/uL (ref 4.0–10.5)
nRBC: 0 % (ref 0.0–0.2)

## 2022-12-12 LAB — BRAIN NATRIURETIC PEPTIDE: B Natriuretic Peptide: 1557.5 pg/mL — ABNORMAL HIGH (ref 0.0–100.0)

## 2022-12-12 MED ORDER — CEPHALEXIN 250 MG PO CAPS
500.0000 mg | ORAL_CAPSULE | Freq: Two times a day (BID) | ORAL | Status: AC
  Administered 2022-12-12 – 2022-12-18 (×14): 500 mg via ORAL
  Filled 2022-12-12 (×14): qty 2

## 2022-12-12 MED ORDER — FUROSEMIDE 10 MG/ML IJ SOLN
40.0000 mg | Freq: Two times a day (BID) | INTRAMUSCULAR | Status: DC
  Administered 2022-12-12 – 2022-12-14 (×4): 40 mg via INTRAVENOUS
  Filled 2022-12-12 (×4): qty 4

## 2022-12-12 MED ORDER — ALBUMIN HUMAN 25 % IV SOLN
25.0000 g | Freq: Four times a day (QID) | INTRAVENOUS | Status: AC
  Administered 2022-12-12 – 2022-12-13 (×4): 25 g via INTRAVENOUS
  Filled 2022-12-12 (×6): qty 100

## 2022-12-12 MED ORDER — SODIUM CHLORIDE 0.9 % IV SOLN: INTRAVENOUS | Status: DC | PRN

## 2022-12-12 NOTE — Progress Notes (Signed)
Focused assessment of this patient and administered IV lasix per order. No acute distress and plan in place, continue to monitor in collaboration with bedside nurse

## 2022-12-12 NOTE — Progress Notes (Signed)
RT obtained ABG and sent to Lab. Lab called and informed.

## 2022-12-12 NOTE — Consult Note (Signed)
NAME:  [name] [name], MRN:  [id number], DOB:  09-23-1940, LOS: 4 ADMISSION DATE:  12/08/2022, CONSULTATION DATE:  12/12/22 REFERRING MD:  Carlis Abbott, CHIEF COMPLAINT:  SOB   History of Present Illness:  82 year old man with a history of heart failure, prior aortic aneurysm repair who was in his usual state of health until earlier this month when he was diagnosed with MRSA pneumonia.  He has had recurrent issues with possible associated pleural effusion, his left pleural space has been drained 2 times in the past month.  He was in rehab slowly getting better until today when he was noted to be more altered and short of breath.  Pulmonology is consulted for the subjective shortness of breath and altered mental status.  Fortunately, altered mental status is improving and only remaining issue is some lingering shortness of breath and orthopnea.  Review of systems as below.  Wife at bedside is a retired Publishing rights manager and corroborates most of the history.  Pertinent  Medical History  Peripheral vascular disease A-fib on Eliquis Chronic lower extremity wounds Severe peripheral vascular disease  Significant Hospital Events: Including procedures, antibiotic start and stop dates in addition to other pertinent events   6/25 pulm consult  Interim History / Subjective:  consult  Objective   Blood pressure 111/61, pulse 91, temperature 97.7 F (36.5 C), temperature source Oral, resp. rate 18, height 5\' 10"  (1.778 m), weight 113 kg, SpO2 91 %.        Intake/Output Summary (Last 24 hours) at 12/12/2022 1322 Last data filed at 12/12/2022 5366 Gross per 24 hour  Intake 297 ml  Output 150 ml  Net 147 ml   Filed Weights   12/10/22 0500 12/11/22 0425 12/12/22 0451  Weight: 115 kg 119 kg 113 kg    Examination: General: Chronically ill deconditioned man sitting in wheelchair HENT: Poor dentition, trachea midline, temporal wasting noted Lungs: Diminished at bases particularly on the left side,  ultrasound reveals bilateral moderate to large free-flowing pleural effusions Cardiovascular: Heart sounds regular, he has a systolic murmur, extremities lukewarm, poor peripheral pulses Abdomen: Scaphoid, positive bowel sounds Extremities: Muscle wasting noted as well as chronic lower extremity wounds Neuro: Moves all extremities to command GU: Deferred  Chest x-ray is pending  Resolved Hospital Problem list   Not applicable  Assessment & Plan:  Acute hypoxemia related to third spacing with significant pleural fluid accumulation.  We will attempt to diurese this off.  He has had pleural fluid studies that show this is not associated with his recent MRSA pneumonia.  If we cannot diurese, will need to consider bilateral therapeutic thoracenteses.  He is not in any acute distress.  My worry is that if we perform these procedures, the fluid will just reaccumulate due to his severe protein calorie malnutrition. - Place IV, start Lasix and albumin - Strict ins and outs, monitor BMP and magnesium - Will follow to determine potential need for thoracentesis should he be unable to diurese  Best Practice (right click and "Reselect all SmartList Selections" daily)   Per primary  Labs   CBC: Recent Labs  Lab 12/06/22 0539 12/07/22 0500 12/09/22 0330 12/11/22 0339 12/12/22 1051  WBC 11.1* 9.9 9.4 8.0 7.5  NEUTROABS  --   --  7.8*  --   --   HGB 8.4* 8.2* 8.4* 7.9* 8.7*  HCT 28.6* 28.1* 29.2* 27.6* 29.7*  MCV 98.6 98.9 102.1* 100.0 98.0  PLT 179 166 140* 141* 150    Basic Metabolic  Panel: Recent Labs  Lab 12/06/22 0539 12/07/22 0500 12/09/22 0330 12/11/22 0339 12/12/22 1051  NA 138 139 142 142 143  K 3.7 3.8 4.0 3.8 4.2  CL 99 100 102 103 106  CO2 30 30 31 30 26   GLUCOSE 118* 119* 110* 100* 138*  BUN 35* 39* 39* 35* 35*  CREATININE 1.73* 1.71* 1.61* 1.51* 1.48*  CALCIUM 8.5* 8.4* 8.5* 8.4* 8.6*  MG  --   --  2.3  --   --    GFR: Estimated Creatinine Clearance: 47.6 mL/min  (A) (by C-G formula based on SCr of 1.48 mg/dL (H)). Recent Labs  Lab 12/07/22 0500 12/09/22 0330 12/11/22 0339 12/12/22 1051  WBC 9.9 9.4 8.0 7.5    Liver Function Tests: Recent Labs  Lab 12/06/22 0539 12/09/22 0330  AST 27 33  ALT 31 26  ALKPHOS 72 72  BILITOT 0.8 0.4  PROT 5.4* 5.3*  ALBUMIN 2.2* 2.2*   No results for input(s): "LIPASE", "AMYLASE" in the last 168 hours. No results for input(s): "AMMONIA" in the last 168 hours.  ABG    Component Value Date/Time   PHART 7.43 12/12/2022 1150   PCO2ART 44 12/12/2022 1150   PO2ART 70 (L) 12/12/2022 1150   HCO3 29.2 (H) 12/12/2022 1150   TCO2 29 10/06/2019 1515   ACIDBASEDEF 2.1 (H) 12/03/2022 0026   O2SAT 96.9 12/12/2022 1150     Coagulation Profile: No results for input(s): "INR", "PROTIME" in the last 168 hours.  Cardiac Enzymes: No results for input(s): "CKTOTAL", "CKMB", "CKMBINDEX", "TROPONINI" in the last 168 hours.  HbA1C: Hgb A1c MFr Bld  Date/Time Value Ref Range Status  08/27/2013 04:40 AM 6.1 (H) <5.7 % Final    Comment:    (NOTE)                                                                       According to the ADA Clinical Practice Recommendations for 2011, when HbA1c is used as a screening test:  >=6.5%   Diagnostic of Diabetes Mellitus           (if abnormal result is confirmed) 5.7-6.4%   Increased risk of developing Diabetes Mellitus References:Diagnosis and Classification of Diabetes Mellitus,Diabetes Care,2011,34(Suppl 1):S62-S69 and Standards of Medical Care in         Diabetes - 2011,Diabetes Care,2011,34 (Suppl 1):S11-S61.  05/08/2013 05:32 AM 5.8 (H) <5.7 % Final    Comment:    (NOTE)                                                                       According to the ADA Clinical Practice Recommendations for 2011, when HbA1c is used as a screening test:  >=6.5%   Diagnostic of Diabetes Mellitus           (if abnormal result is confirmed) 5.7-6.4%   Increased risk of  developing Diabetes Mellitus References:Diagnosis and Classification of Diabetes Mellitus,Diabetes Care,2011,34(Suppl 1):S62-S69 and Standards of Medical Care in  Diabetes - 2011,Diabetes Care,2011,34 (Suppl 1):S11-S61.    CBG: No results for input(s): "GLUCAP" in the last 168 hours.  Review of Systems:    Positive Symptoms in bold:  Constitutional fevers, chills, weight loss, fatigue, anorexia, malaise  Eyes decreased vision, double vision, eye irritation  Ears, Nose, Mouth, Throat sore throat, trouble swallowing, sinus congestion  Cardiovascular chest pain, paroxysmal nocturnal dyspnea, lower ext edema, palpitations   Respiratory SOB, cough, DOE, hemoptysis, wheezing  Gastrointestinal nausea, vomiting, diarrhea  Genitourinary burning with urination, trouble urinating  Musculoskeletal joint aches, joint swelling, back pain  Integumentary  rashes, skin lesions  Neurological focal weakness, focal numbness, trouble speaking, headaches  Psychiatric depression, anxiety, confusion  Endocrine polyuria, polydipsia, cold intolerance, heat intolerance  Hematologic abnormal bruising, abnormal bleeding, unexplained nose bleeds  Allergic/Immunologic recurrent infections, hives, swollen lymph nodes     Past Medical History:  He,  has a past medical history of Acute on chronic diastolic heart failure (HCC) (10/20/6642), Acute on chronic respiratory failure with hypoxia (HCC) (10/03/2019), Acute on chronic systolic heart failure (HCC) (03/47/4259), Acute respiratory failure with hypoxia (HCC) (10/07/2013), AKI (acute kidney injury) (HCC) (12/17/2019), Aortic atherosclerosis (HCC) (05/05/2013), Aortic dissection (HCC) (05/04/2013), Aortic valve insufficiency, Arthritis, Atrial fibrillation (HCC) (05/21/2013), Atrial flutter (HCC), Bradycardia (08/23/2013), CAD (coronary artery disease), Chest pain (01/28/2015), CHF (congestive heart failure) (HCC), Chronic diastolic heart failure (HCC) (5/63/8756),  Descending aortic aneurysm (HCC), DVT (deep venous thrombosis) (HCC), Dyspnea (09/2019), HCAP (healthcare-associated pneumonia) (10/11/2013), History of blood transfusion, HTN (hypertension), Hyperlipidemia, Hypotension (12/17/2019), Incarcerated left inguinal hernia s/p repair 12/02/2019 (12/01/2019), Insomnia (09/04/2013), Multiple fractures of ribs, right side, init for clos fx (09/04/2019), Near syncope (01/28/2015), Pressure injury of skin (10/06/2019), 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   Surgical History:   Past Surgical History:  Procedure Laterality Date   AORTIC VALVE REPLACEMENT  02/19/2009   AORTO-FEMORAL BYPASS GRAFT  03/2010   ascending aortic and arch aneurysm repair/notes 04/09/2009   CARDIAC VALVE REPLACEMENT     CAROTID-SUBCLAVIAN BYPASS GRAFT Left 08/26/2013   Procedure: BYPASS GRAFT CAROTID-SUBCLAVIAN;  Surgeon: Nada Libman, MD;  Location: [facility] OR;  Service: Vascular;  Laterality: Left;   CATARACT EXTRACTION W/ INTRAOCULAR LENS  IMPLANT, BILATERAL Bilateral    ENDOVASCULAR STENT INSERTION N/A 08/26/2013   Procedure:  THORACIC STENT GRAFT INSERTION;  Surgeon: Nada Libman, MD;  Location: [facility] OR;  Service: Vascular;  Laterality: N/A;   EYE SURGERY     INGUINAL HERNIA REPAIR Left 12/02/2019   Procedure: REPAIR LEFT INGUINAL HERNIA WITH MESH;  Surgeon: Violeta Gelinas, MD;  Location: [facility] OR;  Service: General;  Laterality: Left;   INGUINAL HERNIA REPAIR Left 12/18/2019   Procedure: LAPAROSCOPIC ASSISTED REPAIR OF RECURRENT INCARCERATED LEFT INGUINAL HERNIA WITH MESH; LAPAOSCOPIC REPAIR OF SEROSAL TEAR;  Surgeon: Gaynelle Adu, MD;  Location: [facility] OR;  Service: General;  Laterality: Left;   INSERTION OF MESH Left 12/02/2019   Procedure: INSERTION OF MESH;   Surgeon: Violeta Gelinas, MD;  Location: [facility] OR;  Service: General;  Laterality: Left;   JOINT REPLACEMENT     KNEE ARTHROSCOPY Bilateral    REPLACEMENT TOTAL KNEE Bilateral    right groin lymphocele Right 03/29/2009   RIGHT/LEFT HEART CATH AND CORONARY ANGIOGRAPHY N/A 10/06/2019   Procedure: RIGHT/LEFT HEART CATH AND CORONARY ANGIOGRAPHY;  Surgeon: Lyn Records, MD;  Location: [facility] INVASIVE CV  LAB;  Service: Cardiovascular;  Laterality: N/A;   THORACENTESIS  2010 X 2   TONSILLECTOMY       Social History:   reports that he quit smoking about 48 years ago. His smoking use included cigarettes. He has never used smokeless tobacco. He reports that he does not drink alcohol and does not use drugs.   Family History:  His family history includes Aortic aneurysm in his father; Celiac disease in his daughter; Hypertension in his mother. There is no history of Heart attack or Stroke.   Allergies Allergies  Allergen Reactions   Lortab [Hydrocodone-Acetaminophen] Hives    No trouble breathing   Morphine And Codeine Hives   Other Other (See Comments)    Staples from surgery caused infection     Home Medications  Prior to Admission medications   Medication Sig Start Date End Date Taking? Authorizing Provider  furosemide (LASIX) 40 MG tablet Take 40 mg by mouth daily.   Yes [provider]  metoprolol succinate (TOPROL-XL) 25 MG 24 hr tablet Take 25 mg by mouth daily.   Yes [provider]  acetaminophen (TYLENOL) 500 MG tablet Take 2 tablets (1,000 mg total) by mouth every 6 (six) hours as needed. Patient taking differently: Take 1,000 mg by mouth every 6 (six) hours as needed for mild pain or moderate pain. 09/08/19   Barnetta Chapel, PA-C  albuterol (PROVENTIL) (2.5 MG/3ML) 0.083% nebulizer solution Take by nebulization. 06/22/22   [provider]  amiodarone (PACERONE) 200 MG tablet Take 1 tablet (200 mg total) by mouth daily. 07/12/22   [name], MD  apixaban  (ELIQUIS) 2.5 MG TABS tablet Take 1 tablet (2.5 mg total) by mouth 2 (two) times daily. 11/15/22   [name], MD  aspirin 81 MG chewable tablet Chew 1 tablet (81 mg total) by mouth daily. 12/09/22   Burnadette Pop, MD  feeding supplement (ENSURE ENLIVE / ENSURE PLUS) LIQD Take 237 mLs by mouth 3 (three) times daily between meals. 12/08/22   Burnadette Pop, MD  ferrous sulfate (SV IRON) 325 (65 FE) MG tablet Take 1 tablet (325 mg total) by mouth daily with breakfast. 03/17/22   [name], MD  levocetirizine (XYZAL) 5 MG tablet Take 5 mg by mouth daily. 04/21/21   [provider]  linezolid (ZYVOX) 600 MG tablet Take 1 tablet (600 mg total) by mouth every 12 (twelve) hours. 12/09/22   Burnadette Pop, MD  midodrine (PROAMATINE) 10 MG tablet Take 1 tablet (10 mg total) by mouth 3 (three) times daily with meals. 12/08/22   Burnadette Pop, MD  Multiple Vitamin (MULTIVITAMIN WITH MINERALS) TABS tablet Take 1 tablet by mouth daily.    [provider]  Multiple Vitamins-Minerals (ZINC PO) Take 1 tablet by mouth daily at 6 (six) AM.    [provider]  pantoprazole (PROTONIX) 40 MG tablet Take 1 tablet (40 mg total) by mouth daily. 12/09/22   Burnadette Pop, MD  polyethylene glycol (MIRALAX / GLYCOLAX) 17 g packet Take 17 g by mouth daily. 12/09/22   Burnadette Pop, MD  senna (SENOKOT) 8.6 MG TABS tablet Take 1 tablet (8.6 mg total) by mouth 2 (two) times daily. 12/08/22   Burnadette Pop, MD  tamsulosin (FLOMAX) 0.4 MG CAPS capsule Take 0.4 mg by mouth.    [provider]  traMADol (ULTRAM) 50 MG tablet Take 50 mg by mouth every 6 (six) hours as needed for moderate pain. 11/27/22   [provider]  Critical care time: N/A

## 2022-12-12 NOTE — Progress Notes (Signed)
Physical Therapy Session Note  Patient Details  Name: [name] [name] MRN: [id number] Date of Birth: 05/13/1941  Today's Date: 12/12/2022 PT Individual Time: 1100-1157 PT Individual Time Calculation (min): 57 min   Short Term Goals: Week 1:  PT Short Term Goal 1 (Week 1): Pt will perform supine<>sit with min assist PT Short Term Goal 2 (Week 1): Pt will consistently perform sit<>stands using LRAD with min assist PT Short Term Goal 3 (Week 1): Pt will perform bed<>chair transfers using LRAD with min assist PT Short Term Goal 4 (Week 1): Pt will ambulate at least 34ft using LRAD with min assist of 1 and +2 for line management as needed PT Short Term Goal 5 (Week 1): Pt will navigate 4 steps using HRs with mod assist of 1 and +2 for safety if needed  Skilled Therapeutic Interventions/Progress Updates:   Received pt sitting in [facility] with wife present at bedside. Pt agreeable to PT treatment and denied any pain during session but extremely fatigued. Pt on 0.5L O2 with SPO2 98%, decreased to RA and SPO2 94-100% throughout remainder of session. Session with emphasis on functional mobility/transfers, generalized strengthening and endurance, dynamic standing balance/coordination, and gait training. Pt transported to/from room in [facility] dependently for time management purposes. Stood from [facility] with RW and heavy mod A and pt ambulated 54ft with RW and min A including 1 turn - cues to widen BOS and for upright posture/gaze.  Pt required x 2 attempts and max A to stand from [facility] and performed 1x4 standing marches with heavy reliance on BUE support and CGA - limited by fatigue and unable to continue. Suggested transitioning to seated exercises due to fatigue, however pt only able to perform x10 marches bilaterally before stopping and requesting to return to room. Pt wanting to try to stay up for lunch, but slouching down in chair - agreed to try sitting up in recliner. Located recliner for pt while respiratory arrived to  draw blood to check CO2 levels. Of note, per RN UA culture came back positive for UTI. Pt transferred WC<>recliner stand<>pivot with RW and CGA (but required max A to stand from [facility] due to fatigue). Concluded session with pt sitting in recliner on Roho cushion, with all needs within reach and wife present at bedside.   Therapy Documentation Precautions:  Precautions Precautions: Fall, Other (comment) Precaution Comments: awaiting bilateral 2nd toe amputations, sacral wound, hx of Afib, hx of bil rotator cuff tears Required Braces or Orthoses: Other Brace Other Brace: prevalon boots for skin protection Restrictions Weight Bearing Restrictions: No  Therapy/Group: Individual Therapy [name] [name] PT, DPT 12/12/2022, 7:15 AM

## 2022-12-12 NOTE — Progress Notes (Signed)
Orthopedic Tech Progress Note Patient Details:  [name] [name] 03/30/1941 [id number]  Spoke with MD and PA about [facility] for patient, they wanted to wait so they could have a talk with the ordering provider so patient can be serviced correctly   Patient ID: [name] [name], male   DOB: [birth year], 82 y.o.   MRN: [id number]  [name] 12/12/2022, 11:10 AM

## 2022-12-12 NOTE — Progress Notes (Signed)
PROGRESS NOTE   Subjective/Complaints: Confused this morning, ABG, CXR, UA labs ordered UA is positive, Keflex started Patient voices no complaints  ROS: denies Afib and c/o B/L heel pain; "normal stool" yesterday afternoon Objective:   No results found. 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 Subcutaneous fat may be visible but bone, tendon or muscle are NOT exposed. (Active)  11/29/22 1046  Location: Heel  Location Orientation: Right  Staging: Stage 3 -  Full thickness tissue loss. Subcutaneous fat may be visible but bone, tendon or muscle are NOT exposed.  Wound Description (Comments):   Present on Admission: Yes    Physical Exam: Vital Signs Blood pressure 111/61, pulse 91, temperature 97.7 F (36.5 C), temperature source Oral, resp. rate 18, height 5\' 10"  (1.778 m), weight 113 kg, SpO2 91 %. General: awake, alert, calm; sitting up in bed with nursing and therapy in room; NAD, more confused this morning HENT: conjugate gaze; oropharynx moist- [place] O2 2.5L CV: irregular rhythm; regular rate; no JVD Pulmonary: decreased at bases- a few wheezes- rare B/L GI: soft, NT, ND, (+)BS- normoactive Psychiatric: calm Neurological: poor historian figured out after pt seen- appeared rational, but was confused about medical issues  Skin: Multiple wounds + Sacral PI, Stage 4 + R heel PI, unstageable + R 2nd toe extruded bone; no apparent drainage + L second toe wound with clean base, bloody drainage + B/l shin wounds, healing                                  MSK:      No apparent deformity. BL UE shoulder abduction limited to <90 degrees d/t bilateral rotator cuff tears.      Strength:                RUE: 3/5 SA, 3/5 EF, 3/5 EE, 4/5 WE, 4/5 FF, 4/5 FA                 LUE: 3/5 SA, 3/5 EF, 3/5 EE, 4/5 WE, 4/5 FF, 4/5 FA                 RLE: 2/5 HF, 4/5 KE, 4/5 DF, 4/5 PF                 LLE:  2/5 HF, 4/5 KE, 4/5 DF, 4/5 PF    Neurologic exam:  Cognition: AAO to person, place, time and event.  Language: Fluent, No substitutions or neoglisms. +Hypophonic, breathy vocalizations Memory: Recalls 2/3 objects at 5 minutes.  Insight: Poor insight into current  condition.  Mood: Flat affect, appropriate mood.  Sensation: To light touch reduced in bilateral LEs Reflexes: 2+ in BL UE and Les  CN: 2-12 grossly intact.  Coordination: R>L UE intention tremors Spasticity: MAS 0 in all extremities.    Assessment/Plan: 1. Functional deficits which require 3+ hours per day of interdisciplinary therapy in a comprehensive inpatient rehab setting. Physiatrist is providing close team supervision and 24 hour management of active medical problems listed below. Physiatrist and rehab team continue to assess barriers to discharge/monitor patient progress toward functional and medical goals  Care Tool:  Bathing    Body parts bathed by patient: Abdomen, Face, Chest   Body parts bathed by helper: Front perineal area, Buttocks, Right upper leg, Left upper leg, Right lower leg, Left lower leg, Right arm, Left arm     Bathing assist Assist Level: Maximal Assistance - Patient 24 - 49%     Upper Body Dressing/Undressing Upper body dressing   What is the patient wearing?: Pull over shirt    Upper body assist Assist Level: Maximal Assistance - Patient 25 - 49%    Lower Body Dressing/Undressing Lower body dressing      What is the patient wearing?: Pants     Lower body assist Assist for lower body dressing: Total  Assistance - Patient < 25%     Toileting Toileting Toileting Activity did not occur Press photographer and hygiene only): N/A (no void or bm)  Toileting assist Assist for toileting: Maximal Assistance - Patient 25 - 49%     Transfers Chair/bed transfer  Transfers assist     Chair/bed transfer assist level: Minimal Assistance - Patient > 75% Chair/bed transfer assistive device: Geologist, engineering   Ambulation assist      Assist level: 2 helpers Assistive device: Walker-rolling Max distance: 30ft   Walk 10 feet activity   Assist     Assist level: 2 helpers Assistive device: Walker-rolling   Walk 50 feet activity   Assist Walk 50 feet with 2 turns activity did not occur: Safety/medical concerns         Walk 150 feet activity   Assist Walk 150 feet activity did not occur: Safety/medical concerns         Walk 10 feet on uneven surface  activity   Assist Walk 10 feet on uneven surfaces activity did not occur: Safety/medical concerns         Wheelchair     Assist Is the patient using a wheelchair?: Yes Type of Wheelchair: Manual    Wheelchair assist level: Dependent - Patient 0%      Wheelchair 50 feet with 2 turns activity    Assist        Assist Level: Dependent - Patient 0%   Wheelchair 150 feet activity     Assist      Assist Level: Dependent - Patient 0%   Blood pressure 111/61, pulse 91, temperature 97.7 F (36.5 C), temperature source Oral, resp. rate 18, height 5\' 10"  (1.778 m), weight 113 kg, SpO2 91 %.  Medical Problem List and Plan: 1. Functional deficits secondary to debility from loculated pleural effusion and MRSA pneumonia             -patient may shower             -ELOS/Goals: 7 to 10 days, PT/OT SPV   6/24- con't CIR PT and OT 2.  Antithrombotics: -DVT/anticoagulation:  Pharmaceutical: Eliquis             -  antiplatelet therapy: Aspirin    3. Pain Management: Tylenol, tramadol as  needed   6/24- denies pain except heels B/L  4. Mood/Behavior/Sleep: LCSW to evaluate and provide emotional support             -antipsychotic agents: n/a   5. Neuropsych/cognition: This patient is not capable of making decisions on his own behalf.   6. Skin/Wound Care: Routine skin care checks             -local care to sacrum, bilateral toes (Dr. Lajoyce Corners)             -continue pressure relief/Prevalon boots/mattress             - Added multivitamin with minerals and ascorbic acid 500 mg daily for wound healing on admission   6/24- pt would benefit from a healing shoe for B/L feet/toes wounds- pt also c/o heel pain.  7. Fluids/Electrolytes/Nutrition/Malnutrition: Routine Is and Os and follow-up chemistries             - Poor POS, no appetite; Ensure TID, consider Marinol    8: Hypotension: monitor TID and prn             -continue midodrine    9: Hyperlipidemia: continue statin   10: Urinary retention: continue flomax since still having urgency   11: combined chronic systolic/diastolic heart failure: daily weight             -continue Pacerone 200 mg daily             -on no diuretics   6/24- weight bumped up dramatically today- from 115 to 119- will recheck in AM before giving diuretics- will verify, since off due to CKD/AKI 12: chronic atrial fibrillation: rate controlled             -on Eliquis, Pacerone   6/24- pt told me this AM- never had Afib/irregular heart rate- did research to find he does have it- and pt poor historian 13: Pneumonia with effusion/MRSA sepsis:             -continue Zyvox for 4 weeks             -O2 via [place] at 2 L             -BiPAP as needed/consult respiratory therapy  6/24- will get PICC line out since on oral ABX. Breathing "good" per pt- denies SOB   14: s/p AVR, stable thoracic aortic aneurysm: follow-up outpatient   15: AKI atop CKD IIIb: (baseline 1.2)             -slowing improving, encourage adequate hydration   6/24- Cr 1.51 and BUN 35- down  from 39- and Cr down from 1.61/1.71- so downtrending. Con't to monitor  16: Chronic anemia/iron deficiency: f/u CBC on Mondays. Iron supplement started   Hgb reviewed and improving  17: Hard of hearing: wife to bring in hearing aids  32. Suboptimal vitamin D: continue ergocalciferol 50,000U one per week for 7 weeks  19. Loose stool: florastor added  20. Constipation: miralax d/ced, reviewed chart- last BM 6/24  21. Morbid Obesity- BMI 35.74- encourage weight loss  22. UTI: start keflex  23. Confusion: check CXR    LOS: 4 days A FACE TO FACE EVALUATION WAS PERFORMED  Drema Pry Cheryel Kyte 12/12/2022, 10:52 AM

## 2022-12-12 NOTE — Progress Notes (Signed)
Occupational Therapy Session Note  Patient Details  Name: ABDIMALIK MAYORQUIN MRN: [id number] Date of Birth: Dec 12, 1940  Today's Date: 12/12/2022 OT Individual Time: 7829-5621 & 3086-5784 OT Individual Time Calculation (min): 70 min & 42 min OT missed time: 33 mins Missed time reason: fatigue; IV and lab nurse care    Short Term Goals: Week 1:  OT Short Term Goal 1 (Week 1): Pt will complete sit<>stand during self-care task with mod assist. OT Short Term Goal 2 (Week 1): To promote participation in ADLs, pt will engage in functional activity in standing for 2+ min without rest break. OT Short Term Goal 3 (Week 1): Pt will complete toilet transfer with mod assist. OT Short Term Goal 4 (Week 1): Pt will complete UB dressing with min assist.  Skilled Therapeutic Interventions/Progress Updates:  Session 1 Skilled OT intervention completed with focus on activity tolerance, re-orientation, toileting needs, functional transfers. Pt received upright in bed. No pain reported.  Per nursing, pt confused this AM with UA send off for check for UTI. Upon OT arrival, pt was stating "I fell, and now I'm here." He was unable to state where but then OT gathered he was implying he fell, and was at "[facility]." Wife later arrived and confirmed that pt is recalling events from back in January when he fell a prior time, and also a past hospitalization at a hospital up [place] in their home town. Pt stated it was 2004 as well. OT provided reorientation.  Pt reported urge for BM, agreeable to use commode. Transitioned to EOB with HOB elevated with close supervision. Dizziness reported upon sitting; see BP below. Minimal change in dizziness despite seated rest or even during session. Donned O2 for below vitals. Cues for anterior weight shift, then min A sit > stand using RW, and upon transfer pt became slightly tearful as if in fear about being upright, with OT needing to provide encouragement for CGA stand pivot  to w/c. Total A to doff LB clothing, then pt was incontinent of void and continent of loose BM. Pt with small void incontinence despite stating being done. Sit > stand with min A with again cues for anterior weight shift, using RW. Then completed total A pericare and toileting needs. CGA stand pivot to w/c.   Wife then arrived, and assisted with UB bathing, dressing, oral care with max A. Wife is helpful however pt does seem to rely on her full assist vs initiating himself and this has been baseline for several months (about 1 month PTA). Extended rest provided fatigue. Pt also started talking to himself in the mirror when seeing his reflection.  Issued pt Boost per thin diet, as pt did not eat this AM. Then completed gentle AROM of BUE for gross motor coordination and bilateral integration needed with BADLs using rolled towel for gentle shoulder flexion and elbow flexion x12. Pt remained seated in w/c, with family present therefore alarm left off, and with all needs in reach at end of session.  Vitals BP 99/79 (sitting EOB), HR 108 bpm, SPO2 87% on RA BP 131/68 (sitting in w/c after activity), HR 89 bpm, SPO2 95% on 0.5L  Session 2 Skilled OT intervention completed with focus on activity tolerance, toileting needs, functional ambulation. Pt received seated in recliner, in/out of alertness. Wife present stating pt has fluid on one lung per pulmonology visit. Pt agreeable to session. No pain reported.  Per nursing approval, pt ok to participate as tolerated but with new orders to  maintain O2 > 96% and with return to 2L, however pt with high fatigue, but agreeable to trial toileting, change into gown and return to bed in prep for IV placement.  Min A sit > stand RW with cues for anterior weight shift, then CGA ambulatory transfer into bathroom with safety cues for over bathroom threshold. Doffed LB clothing total A. Continent of void, and small BM; nurse notified. Doffed sacral foam dressing due to  soilage. Stood with min A using grab bar/RW then total A for pericare, application of new foam dressing with kerlix on stage 4 pressure injury, then threaded brief. CGA ambulatory transfer > bed with RW with directional cues for backing up to EOB. Dynamic sitting balance supervision, for doffing of shirt by wife with max A, and donning of gown with mod A. Transitioned EOB > supine min A for BLE management. +2 needed to boost towards HOB. Donned bilateral prevalon boots for wound/pressure injure management. Pillow placed under R buttock after pt rolled > L with supervision, for sacral wound pressure relief. IV/lab nursing team present for care, therefore pt missed 33 mins of OT intervention as well as due to fatigue; will attempt to make up missed mins as able.  Pt remained semi upright in bed, with bed alarm on/activated, and with all needs in reach at end of session.  Vitals -Received on 2L via [place], SPO2 99% -93% on 2L after activity but returned to > 96% with rest; HR 116 bpm (nurse aware)    Therapy Documentation Precautions:  Precautions Precautions: Fall, Other (comment) Precaution Comments: awaiting bilateral 2nd toe amputations, sacral wound, hx of Afib, hx of bil rotator cuff tears Required Braces or Orthoses: Other Brace Other Brace: prevalon boots for skin protection Restrictions Weight Bearing Restrictions: No    Therapy/Group: Individual Therapy  Melvyn Novas, MS, OTR/L  12/12/2022, 2:32 PM

## 2022-12-13 DIAGNOSIS — N179 Acute kidney failure, unspecified: Secondary | ICD-10-CM | POA: Diagnosis not present

## 2022-12-13 DIAGNOSIS — Z1321 Encounter for screening for nutritional disorder: Secondary | ICD-10-CM | POA: Diagnosis not present

## 2022-12-13 DIAGNOSIS — D509 Iron deficiency anemia, unspecified: Secondary | ICD-10-CM

## 2022-12-13 DIAGNOSIS — R0602 Shortness of breath: Secondary | ICD-10-CM

## 2022-12-13 DIAGNOSIS — J15212 Pneumonia due to Methicillin resistant Staphylococcus aureus: Secondary | ICD-10-CM | POA: Diagnosis not present

## 2022-12-13 LAB — BASIC METABOLIC PANEL
Anion gap: 12 (ref 5–15)
BUN: 34 mg/dL — ABNORMAL HIGH (ref 8–23)
CO2: 26 mmol/L (ref 22–32)
Calcium: 8.6 mg/dL — ABNORMAL LOW (ref 8.9–10.3)
Chloride: 107 mmol/L (ref 98–111)
Creatinine, Ser: 1.55 mg/dL — ABNORMAL HIGH (ref 0.61–1.24)
GFR, Estimated: 44 mL/min — ABNORMAL LOW (ref >60)
Glucose, Bld: 102 mg/dL — ABNORMAL HIGH (ref 70–99)
Potassium: 3.7 mmol/L (ref 3.5–5.1)
Sodium: 145 mmol/L (ref 135–145)

## 2022-12-13 LAB — MAGNESIUM: Magnesium: 2.1 mg/dL (ref 1.7–2.4)

## 2022-12-13 MED ORDER — POTASSIUM CHLORIDE CRYS ER 20 MEQ PO TBCR
40.0000 meq | EXTENDED_RELEASE_TABLET | Freq: Once | ORAL | Status: AC
  Administered 2022-12-13: 40 meq via ORAL
  Filled 2022-12-13: qty 2

## 2022-12-13 NOTE — Progress Notes (Signed)
Physical Therapy Session Note  Patient Details  Name: [name] [name] MRN: [id number] Date of Birth: 1941/05/12  Today's Date: 12/13/2022 PT Individual Time: 0929-1029 PT Individual Time Calculation (min): 60 min   Short Term Goals: Week 1:  PT Short Term Goal 1 (Week 1): Pt will perform supine<>sit with min assist PT Short Term Goal 2 (Week 1): Pt will consistently perform sit<>stands using LRAD with min assist PT Short Term Goal 3 (Week 1): Pt will perform bed<>chair transfers using LRAD with min assist PT Short Term Goal 4 (Week 1): Pt will ambulate at least 52ft using LRAD with min assist of 1 and +2 for line management as needed PT Short Term Goal 5 (Week 1): Pt will navigate 4 steps using HRs with mod assist of 1 and +2 for safety if needed  Skilled Therapeutic Interventions/Progress Updates:   When PT arrived to room, pt in bathroom with CNA with pt sitting on toilet.Wife present. Pt agreeable to treatment. Denies pain. Wife states he is in a much better place today with less confusion.   Therex: Standing tolerance activities performed in bathroom holding onto RW with standing tolerance at 6 min 8 sec with PT CGA for safety. Gait x 12 ft with RW with PT CGA to sink where PT had him reaching outside of base of support for soap and cutting water on/off with pt needing a seated rest break during timing of task. STS from wheelchair with PT CGA to complete task. Pt able to stand for 38,40 sec for each bout. STS x 5 from wheelchair with PT CGA progressing to moderate assist with fatigue and PT needing to issue verbal cues for proper hand placement on wheelchair. Standing alternating hand reaches forward each UE x 10, standing alternating UE shoulder flexion/ext to tolerance x 8 each LE; standing unilat marches x 10 each LE. Seated therex performed with LAQ x 15 with 2-3 sec hold each side; clam shell x 15, AROM DF x 15 each LE.  All performed to increase standing tolerance and functional LE  strengthening. Pt left in wheelchair with wife present. No complaints of pain, only fatigue. Wife states that staff have not been using belt alarm as long as she is present.     Therapy Documentation Precautions:  Precautions Precautions: Fall, Other (comment) Precaution Comments: awaiting bilateral 2nd toe amputations, sacral wound, hx of Afib, hx of bil rotator cuff tears Required Braces or Orthoses: Other Brace Other Brace: prevalon boots for skin protection Restrictions Weight Bearing Restrictions: No      Therapy/Group: Individual Therapy  Luna Fuse 12/13/2022, 12:21 PM

## 2022-12-13 NOTE — Progress Notes (Signed)
Pulm note  Seen briefly. Feeling better w/ keflex + lasix. Continue IV diuretics at least one more day then maybe do oral Will touch base with patient/wife Friday and make sure continued improvement and hopefully we can avoid thoras for reasons mentioned yesterday.  Protein intake was encouraged.  Myrla Halsted MD PCCM

## 2022-12-13 NOTE — Progress Notes (Signed)
Occupational Therapy Session Note  Patient Details  Name: [name] [name] MRN: [id number] Date of Birth: 1941/01/07  Today's Date: 12/13/2022 OT Individual Time: 1420-1530 OT Individual Time Calculation (min): 70 min    Short Term Goals: Week 1:  OT Short Term Goal 1 (Week 1): Pt will complete sit<>stand during self-care task with mod assist. OT Short Term Goal 2 (Week 1): To promote participation in ADLs, pt will engage in functional activity in standing for 2+ min without rest break. OT Short Term Goal 3 (Week 1): Pt will complete toilet transfer with mod assist. OT Short Term Goal 4 (Week 1): Pt will complete UB dressing with min assist.  Skilled Therapeutic Interventions/Progress Updates:  Skilled OT intervention completed with focus on DC planning, ambulatory transfers, dynamic balance and standing tolerance. Pt received seated in recliner, agreeable to session. No pain reported.  Nurse in room to disconnect IV and NT to assess vitals. Discussed with pt's wife about DME recommendations including BSC for night time toileting for safety due to dizziness, decreased cardiorespiratory endurance and urgency/incontinence. Wife reports they have toilet seat raiser already over usual commode.   OT offered bathroom trial due to lasix medication and wife reported pt had not voided since mid morning. Pt required mod cues for anterior weight shift and hand placement, but able to stand with light min A using RW, then ambulate with CGA using RW to commode. Doffed pants with min A, then placement of urinal with total A to trial void in stance due to positioning challenge and mess with seated void. Able to void about 150cc. Sat for attempt at [facility] however unable to go. Stood with supervision using R hand on grab bar and L on RW, then donned pants over hips with min A. Ambulated with close supervision using RW to w/c.   Transported dependently in w/c > gym for energy conservation. Pt participated in the  following dynamic balance and standing tolerance activities to promote independence with BADLs: -sliding pegboard design at elevated table top with table used for UE support/balance. Required min A to power up, with mod cues for hand positioning. Maintained standing > 5 mins with O2 98% on 2L and HR as high as 114 bpm. Min difficulty fading to supervision for problem solving component of task -Min A sit > stand using RW then tossed bean bags with BUE onto matching colored dots. Difficulty with ROM noted at shoulder joint but able to use compensatory strategies for task. Extended rest break needed for fatigue.  Min A sit > stand using RW, then pt ambulated with CGA/close supervision 72 ft back to room, with w/c follow for fatigue but with max encouragement was able to make it all the way back to room. O2 did desat to 92% with mobility and HR 120 bpm however recovered to [facility] with pursed lip breathing.   Pt remained seated in recliner, with belt alarm on/activated, and with all needs in reach at end of session.   Therapy Documentation Precautions:  Precautions Precautions: Fall, Other (comment) Precaution Comments: awaiting bilateral 2nd toe amputations, sacral wound, hx of Afib, hx of bil rotator cuff tears Required Braces or Orthoses: Other Brace Other Brace: prevalon boots for skin protection Restrictions Weight Bearing Restrictions: No    Therapy/Group: Individual Therapy  Melvyn Novas, MS, OTR/L  12/13/2022, 3:42 PM

## 2022-12-13 NOTE — Progress Notes (Signed)
Physical Therapy Session Note  Patient Details  Name: [name] [name] MRN: [id number] Date of Birth: 1941/01/14  Today's Date: 12/13/2022 PT Individual Time: 1054-1210 PT Individual Time Calculation (min): 76 min   Short Term Goals: Week 1:  PT Short Term Goal 1 (Week 1): Pt will perform supine<>sit with min assist PT Short Term Goal 2 (Week 1): Pt will consistently perform sit<>stands using LRAD with min assist PT Short Term Goal 3 (Week 1): Pt will perform bed<>chair transfers using LRAD with min assist PT Short Term Goal 4 (Week 1): Pt will ambulate at least 40ft using LRAD with min assist of 1 and +2 for line management as needed PT Short Term Goal 5 (Week 1): Pt will navigate 4 steps using HRs with mod assist of 1 and +2 for safety if needed  Skilled Therapeutic Interventions/Progress Updates:    Pt presents in room in [facility], agreeable to PT with encouragement. Pt denies pain at this time. Session focused on therex for BLE strengthening, gait training for endurance and tolerance to upright, and transfer training as well as education on energy conservation. Pt noted to be on 2L O2, SpO2 100% at rest and with activity during session. Pt completes sit<>stand transfers with mod assist to RW throughout session. Therapist assist for line mgmt with IV and O2 during session.  Pt transported dependently in WC from room to therapy gym for time management and energy conservation. Pt completes seated therex as warm up prior to gait training and to promote BLE strengthening and muscle fiber recruitment needed for functional transfers and ambulation with pt demonstrating falling asleep while seated. Exercises included: Seated marches x20 alternating BLEs Seated LAQs x20 alternating BLEs Seated DF x20 Seated hip abduction x5 BLE (verbal cues to attend to task with pt falling asleep)  Pt then completes x2 sit<>stand to RW, then ambulates 21' with RW min assist with 2nd person assist with lines. Pt  demonstrating NBOS during ambulation, forward trunk flexion and increased UE support on RW. Pt requires extended seated rest break due to fatigue then ambulates 60' with RW min assist of 1 person, demonstrates improved upright posture and environmental awareness with second ambulation with decreased verbal cues.  Pt then completes x10 standing mini squats with BUE support on RW to promote improved gluteal strengthening and muscle fiber recruitment needed for functional transfers.  Pt requesting to use restroom, pt returned to room and ambulates from [facility] to platform BSC over toilet in bathroom to void urine in sitting, continent and charted. Pt requires max assist for managing pants and brief in standing. Pt then ambulates back to recliner to return to sitting. Pt remains seated with pt hooked up to wall O2, all needs in reach, call light in place, wife at bedside and RN present at end of session.  Therapy Documentation Precautions:  Precautions Precautions: Fall, Other (comment) Precaution Comments: awaiting bilateral 2nd toe amputations, sacral wound, hx of Afib, hx of bil rotator cuff tears Required Braces or Orthoses: Other Brace Other Brace: prevalon boots for skin protection Restrictions Weight Bearing Restrictions: No   Therapy/Group: Individual Therapy  Edwin Cap PT, DPT 12/13/2022, 12:55 PM

## 2022-12-13 NOTE — Progress Notes (Signed)
Occupational Therapy Session Note  Patient Details  Name: [name] [name] MRN: [id number] Date of Birth: [birth year]  {CHL IP REHAB OT TIME CALCULATIONS:[id number]}   Short Term Goals: Week 1:  OT Short Term Goal 1 (Week 1): Pt will complete sit<>stand during self-care task with mod assist. OT Short Term Goal 2 (Week 1): To promote participation in ADLs, pt will engage in functional activity in standing for 2+ min without rest break. OT Short Term Goal 3 (Week 1): Pt will complete toilet transfer with mod assist. OT Short Term Goal 4 (Week 1): Pt will complete UB dressing with min assist.  Skilled Therapeutic Interventions/Progress Updates:    Patient agreeable to participate in OT session. Reports *** pain level.   Patient participated in skilled OT session focusing on ***. Therapist facilitated/assessed/developed/educated/integrated/elicited *** in order to improve/facilitate/promote    Therapy Documentation Precautions:  Precautions Precautions: Fall, Other (comment) Precaution Comments: awaiting bilateral 2nd toe amputations, sacral wound, hx of Afib, hx of bil rotator cuff tears Required Braces or Orthoses: Other Brace Other Brace: prevalon boots for skin protection Restrictions Weight Bearing Restrictions: No  Therapy/Group: Individual Therapy  Limmie Patricia, OTR/L,CBIS  Supplemental OT - [facility] and [facility] Secure Chat Preferred   12/13/2022, 9:51 PM

## 2022-12-13 NOTE — Patient Care Conference (Signed)
Inpatient RehabilitationTeam Conference and Plan of Care Update Date: 12/13/2022   Time: 11:44 AM    Patient Name: [name] [name]      Medical Record Number: [id number]  Date of Birth: 01-18-1941 Sex: Male         Room/Bed: 4W14C/4W14C-01 Payor Info: Payor: MEDICARE / Plan: MEDICARE PART A AND B / Product Type: *No Product type* /    Admit Date/Time:  12/08/2022  6:13 PM  Primary Diagnosis:  Pneumonia  Hospital Problems: Principal Problem:   Pneumonia    Expected Discharge Date: Expected Discharge Date: 12/23/22  Team Members Present: Physician leading conference: Dr. Sula Soda Social Worker Present: Lavera Guise, BSW Nurse Present: Chana Bode, RN PT Present: Wynelle Link, PT OT Present: Candee Furbish, OT PPS Coordinator present : Fae Pippin, SLP     Current Status/Progress Goal Weekly Team Focus  Bowel/Bladder   Patient is continent and incontinent of bladder and bowell, LBM 6/26/24i       Assess toileting needs frequently,    Swallow/Nutrition/ Hydration               ADL's   Max A UB due to limited AROM/reliance on wife at baseline about a month PTA, Max A LB and toileting   min A ADLs, supervision mobility   Barrier to DC- cardiovascular/respiratory endurance. Plan- energy conservation education within ADL task using AE or modified strategies    Mobility   supine<>sit supervision using bed features, sit<>supine mod A, transfers with RW min/light mod A, gait 30ft with RW and min A   CGA, min A steps  barriers: global weakness, deconditioning, proper foot protection, continue weaning off O2    Communication                Safety/Cognition/ Behavioral Observations               Pain       < 3/10 on pain scale, manage pain effectively   Assess patient pain tolerance and medicate PRN  reassessment QS/PRN    Skin   Patient has multiple skin wounds, scattered sacrum, bilateral toes, bilateral upperand lower extremities    Minimize the risk of infection, and further damage or breakdown to the patient skin  Assess the skin and wound areas QS/PRN,and follow treatment plans      Discharge Planning:  Discharging home with spouse 24/7. 1 level mobile home, 4 steps.   Team Discussion: Patient with fluid overload; diuresed. UA , C+S (+); started on keflex.  Ongoing wounds being managed. Limited by cardiovascular/pulmonary issues with deconditioning and poor ROM in shoulders (RTC) bilaterally. Weaning O2 however given respiratory issues; anticipate will go home with supplemental oxygen.  Patient on target to meet rehab goals: yes, currently needs max assist for upper body care and max for lower body and toileting.  Needs supervision for bed mobility but mod assist for stand to supine. Able to ambulate up to 20' using a RW. Goals for discharge set for min assist for ADLs and min assist for steps with CGA for transfers.  *See Care Plan and progress notes for long and short-term goals.   Revisions to Treatment Plan:  Palliative Care Consult for goals of care Oxygen for home use   Teaching Needs: Safety, wound care/skin care, medications, dietary modifications, transfers, energy conservation, etc.  Current Barriers to Discharge: Decreased caregiver support, Home enviroment access/layout, Wound care, and foot protection with wounds and bone exposure.   Possible Resolutions to Barriers: Family education  HH follow up services DME: [employer]     Medical Summary Current Status: UTI, fluid overload, hypoxia, morbid obesity, rotator cuff injuries  Barriers to Discharge: Medical stability;Morbid Obesity  Barriers to Discharge Comments: UTI, fluid overload, hypoxia, morbid obesity, rotator cuff injuries Possible Resolutions to Barriers/Weekly Focus: Keflex started, IV lasix ordered, pulmonology consulted, ABG checked, provide deitary education, daily weights, wean tramadol   Continued Need for Acute Rehabilitation  Level of Care: The patient requires daily medical management by a physician with specialized training in physical medicine and rehabilitation for the following reasons: Direction of a multidisciplinary physical rehabilitation program to maximize functional independence : Yes Medical management of patient stability for increased activity during participation in an intensive rehabilitation regime.: Yes Analysis of laboratory values and/or radiology reports with any subsequent need for medication adjustment and/or medical intervention. : Yes   I attest that I was present, lead the team conference, and concur with the assessment and plan of the team.   Chana Bode B 12/13/2022, 1:22 PM

## 2022-12-13 NOTE — Progress Notes (Signed)
Patient ID: [name] [name], male   DOB: 1940-06-23, 82 y.o.   MRN: [id number]  Team Conference Report to Patient/Family  Team Conference discussion was reviewed with the patient and caregiver, including goals, any changes in plan of care and target discharge date.  Patient and caregiver express understanding and are in agreement.  The patient has a target discharge date of 12/23/22.  SW met with patient and spouse at bedside. SW provided team conference updates. Patient potential to require 02 at d/c, Sw will wait for confirmation. Spouse requesting [facility] for follow up therapy services. Sw inform spouse of additional recommendation closer to d/c. Spouse present daily and plans to participate in therapy session. No additional questions or concerns. Andria Rhein 12/13/2022, 2:41 PM

## 2022-12-13 NOTE — Progress Notes (Signed)
PROGRESS NOTE   Subjective/Complaints: Confusion greatly improved with Keflex, Lasix Team conference today Using commode with aide assistance this morning  ROS: denies Afib and c/o B/L heel pain; "normal stool" yesterday afternoon, confusion improved as per wife  Objective:   DG Chest 2 View  Result Date: 12/12/2022 CLINICAL DATA:  Confusion. EXAM: CHEST - 2 VIEW COMPARISON:  Chest radiographs 12/03/2022, 12/02/2022 MS 11/28/2022 FINDINGS: Interval removal of right upper extremity PICC. Status post median sternotomy. Cardiac silhouette is again moderately to markedly enlarged. Ascending, aortic arch, descending thoracic aortic stent graft again noted. Cardiac valve replacement again noted. Moderate left pleural effusion is similar to 12/03/2022 most recent prior. Stable to slightly decreased small right pleural effusion. Associated bibasilar homogeneous and heterogeneous opacities. Left upper hemithorax surgical clips are again seen. No pneumothorax is seen. No acute skeletal abnormality. Bilateral high-riding humeral heads consistent with full-thickness superior rotator cuff tears. IMPRESSION: 1. Moderate left pleural effusion is similar to 12/03/2022 most recent prior. Stable to slightly decreased small right pleural effusion. 2. Associated bibasilar homogeneous and heterogeneous opacities. Electronically Signed   By: Neita Garnet M.D.   On: 12/12/2022 18:48   Recent Labs    12/11/22 0339 12/12/22 1051  WBC 8.0 7.5  HGB 7.9* 8.7*  HCT 27.6* 29.7*  PLT 141* 150   Recent Labs    12/12/22 1051 12/13/22 0548  NA 143 145  K 4.2 3.7  CL 106 107  CO2 26 26  GLUCOSE 138* 102*  BUN 35* 34*  CREATININE 1.48* 1.55*  CALCIUM 8.6* 8.6*    Intake/Output Summary (Last 24 hours) at 12/13/2022 1147 Last data filed at 12/13/2022 0600 Gross per 24 hour  Intake 360 ml  Output 1000 ml  Net -640 ml     Pressure Injury 11/29/22 Heel  Right Stage 3 -  Full thickness tissue loss. Subcutaneous fat may be visible but bone, tendon or muscle are NOT exposed. (Active)  11/29/22 1046  Location: Heel  Location Orientation: Right  Staging: Stage 3 -  Full thickness tissue loss. Subcutaneous fat may be visible but bone, tendon or muscle are NOT exposed.  Wound Description (Comments):   Present on Admission: Yes    Physical Exam: Vital Signs Blood pressure 125/64, pulse 86, temperature 97.7 F (36.5 C), temperature source Oral, resp. rate 17, height 5\' 10"  (1.778 m), weight 114 kg, SpO2 100 %. General: awake, alert, calm; sitting up in bed with nursing and therapy in room; NAD, less confused, BMI 36.06 HENT: conjugate gaze; oropharynx moist- Echo O2 2.5L CV: irregular rhythm; regular rate; no JVD Pulmonary: decreased at bases- a few wheezes- rare B/L GI: soft, NT, ND, (+)BS- normoactive Psychiatric: calm Neurological: poor historian figured out after pt seen- appeared rational, but was confused about medical issues  Skin: Multiple wounds + Sacral PI, Stage 4 + R heel PI, unstageable + R 2nd toe extruded bone; no apparent drainage + L second toe wound with clean base, bloody drainage + B/l shin wounds, healing  MSK:      No apparent deformity. BL UE shoulder abduction limited to <90 degrees d/t bilateral rotator cuff tears.      Strength:                RUE: 3/5 SA, 3/5 EF, 3/5 EE, 4/5 WE, 4/5 FF, 4/5 FA                 LUE: 3/5 SA, 3/5 EF, 3/5 EE, 4/5 WE, 4/5 FF, 4/5 FA                 RLE: 2/5 HF, 4/5 KE, 4/5 DF, 4/5 PF                 LLE:  2/5 HF, 4/5 KE, 4/5 DF, 4/5 PF    Neurologic exam:  Cognition: AAO to person, place, time and event.  Language: Fluent, No substitutions or neoglisms. +Hypophonic, breathy vocalizations Memory: Recalls 2/3 objects at 5 minutes.  Insight: Poor insight into current condition.  Mood: Flat affect, appropriate mood.  Sensation: To  light touch reduced in bilateral LEs Reflexes: 2+ in BL UE and Les  CN: 2-12 grossly intact.  Coordination: R>L UE intention tremors Spasticity: MAS 0 in all extremities.    Assessment/Plan: 1. Functional deficits which require 3+ hours per day of interdisciplinary therapy in a comprehensive inpatient rehab setting. Physiatrist is providing close team supervision and 24 hour management of active medical problems listed below. Physiatrist and rehab team continue to assess barriers to discharge/monitor patient progress toward functional and medical goals  Care Tool:  Bathing    Body parts bathed by patient: Abdomen, Face, Chest   Body parts bathed by helper: Front perineal area, Buttocks, Right upper leg, Left upper leg, Right lower leg, Left lower leg, Right arm, Left arm     Bathing assist Assist Level: Maximal Assistance - Patient 24 - 49%     Upper Body Dressing/Undressing Upper body dressing   What is the patient wearing?: Pull over shirt    Upper body assist Assist Level: Maximal Assistance - Patient 25 - 49%    Lower Body Dressing/Undressing Lower body dressing      What is the patient wearing?: Pants     Lower body assist Assist for lower body dressing: Total Assistance - Patient < 25%     Toileting Toileting Toileting Activity did not occur Press photographer and hygiene only): N/A (no void or bm)  Toileting assist Assist for toileting: Maximal Assistance - Patient 25 - 49%     Transfers Chair/bed transfer  Transfers assist     Chair/bed transfer assist level: Minimal Assistance - Patient > 75% Chair/bed transfer assistive device: Geologist, engineering   Ambulation assist      Assist level: Minimal Assistance - Patient > 75% Assistive device: Walker-rolling Max distance: 57ft   Walk 10 feet activity   Assist     Assist level: Minimal Assistance - Patient > 75% Assistive device: Walker-rolling   Walk 50 feet  activity   Assist Walk 50 feet with 2 turns activity did not occur: Safety/medical concerns         Walk 150 feet activity   Assist Walk 150 feet activity did not occur: Safety/medical concerns         Walk 10 feet on uneven surface  activity   Assist Walk 10 feet on uneven surfaces activity did not occur: Safety/medical concerns         Wheelchair  Assist Is the patient using a wheelchair?: Yes Type of Wheelchair: Manual    Wheelchair assist level: Dependent - Patient 0%      Wheelchair 50 feet with 2 turns activity    Assist        Assist Level: Dependent - Patient 0%   Wheelchair 150 feet activity     Assist      Assist Level: Dependent - Patient 0%   Blood pressure 125/64, pulse 86, temperature 97.7 F (36.5 C), temperature source Oral, resp. rate 17, height 5\' 10"  (1.778 m), weight 114 kg, SpO2 100 %.  Medical Problem List and Plan: 1. Functional deficits secondary to debility from loculated pleural effusion and MRSA pneumonia             -patient may shower             -ELOS/Goals: 7 to 10 days, PT/OT SPV   6/24- con't CIR PT and OT 2.  Antithrombotics: -DVT/anticoagulation:  Pharmaceutical: Eliquis             -antiplatelet therapy: Aspirin    3. Pain Management: Tylenol, tramadol as needed   6/24- denies pain except heels B/L  4. Mood/Behavior/Sleep: LCSW to evaluate and provide emotional support             -antipsychotic agents: n/a   5. Neuropsych/cognition: This patient is not capable of making decisions on his own behalf.   6. Skin/Wound Care: Routine skin care checks             -local care to sacrum, bilateral toes (Dr. Lajoyce Corners)             -continue pressure relief/Prevalon boots/mattress             - Added multivitamin with minerals and ascorbic acid 500 mg daily for wound healing on admission   6/24- pt would benefit from a healing shoe for B/L feet/toes wounds- pt also c/o heel pain.  7.  Fluids/Electrolytes/Nutrition/Malnutrition: Routine Is and Os and follow-up chemistries             - Poor POS, no appetite; Ensure TID, consider Marinol    8: Hypotension: monitor TID and prn             -continue midodrine    9: Hyperlipidemia: continue statin   10: Urinary retention: continue flomax since still having urgency   11: combined chronic systolic/diastolic heart failure: daily weight             -continue Pacerone 200 mg daily             -on no diuretics   6/24- weight bumped up dramatically today- from 115 to 119- will recheck in AM before giving diuretics- will verify, since off due to CKD/AKI 12: chronic atrial fibrillation: rate controlled             -on Eliquis, Pacerone   6/24- pt told me this AM- never had Afib/irregular heart rate- did research to find he does have it- and pt poor historian 13: Pneumonia with effusion/MRSA sepsis:             -continue Zyvox for 4 weeks             -O2 via [place] at 2 L             -BiPAP as needed/consult respiratory therapy  6/24- will get PICC line out since on oral ABX. Breathing "good" per pt- denies SOB   14:  s/p AVR, stable thoracic aortic aneurysm: follow-up outpatient   15: AKI atop CKD IIIb: (baseline 1.2)             -slowing improving, encourage adequate hydration   Daily BMP  16: Chronic anemia/iron deficiency: f/u CBC on Mondays. Iron supplement started   Hgb reviewed and improving  17: Hard of hearing: wife to bring in hearing aids  41. Suboptimal vitamin D: continue ergocalciferol 50,000U one per week for 7 weeks  19. Loose stool: florastor added  20. Constipation: miralax d/ced, reviewed chart- last BM 6/24  21. Morbid Obesity- BMI 35.74- encourage weight loss, encouraged high protein diet  22. UTI: start keflex, discussed 7 day course with wife  46. Confusion: likely 2/2 UTI, improved with Keflex  24. Pleural effusion: daily weights, IV lasix    LOS: 5 days A FACE TO FACE EVALUATION WAS  PERFORMED  Drema Pry Jidenna Figgs 12/13/2022, 11:47 AM

## 2022-12-14 ENCOUNTER — Other Ambulatory Visit: Payer: Self-pay

## 2022-12-14 LAB — BASIC METABOLIC PANEL
Anion gap: 10 (ref 5–15)
BUN: 36 mg/dL — ABNORMAL HIGH (ref 8–23)
CO2: 29 mmol/L (ref 22–32)
Calcium: 8.4 mg/dL — ABNORMAL LOW (ref 8.9–10.3)
Chloride: 105 mmol/L (ref 98–111)
Creatinine, Ser: 1.65 mg/dL — ABNORMAL HIGH (ref 0.61–1.24)
GFR, Estimated: 41 mL/min — ABNORMAL LOW (ref >60)
Glucose, Bld: 101 mg/dL — ABNORMAL HIGH (ref 70–99)
Potassium: 3.8 mmol/L (ref 3.5–5.1)
Sodium: 144 mmol/L (ref 135–145)

## 2022-12-14 LAB — MAGNESIUM: Magnesium: 2.1 mg/dL (ref 1.7–2.4)

## 2022-12-14 MED ORDER — POTASSIUM CHLORIDE CRYS ER 20 MEQ PO TBCR
40.0000 meq | EXTENDED_RELEASE_TABLET | Freq: Once | ORAL | Status: AC
  Administered 2022-12-14: 40 meq via ORAL
  Filled 2022-12-14: qty 2

## 2022-12-14 MED ORDER — FUROSEMIDE 10 MG/ML IJ SOLN
40.0000 mg | Freq: Two times a day (BID) | INTRAMUSCULAR | Status: DC
  Administered 2022-12-14 – 2022-12-15 (×2): 40 mg via INTRAVENOUS
  Filled 2022-12-14 (×2): qty 4

## 2022-12-14 MED ORDER — FUROSEMIDE 40 MG PO TABS: 40.0000 mg | ORAL_TABLET | Freq: Two times a day (BID) | ORAL | Status: DC

## 2022-12-14 NOTE — Progress Notes (Signed)
Occupational Therapy Session Note  Patient Details  Name: [name] [name] MRN: [id number] Date of Birth: 1940-08-31  Today's Date: 12/14/2022 OT Individual Time: 1015-1040 OT Individual Time Calculation (min): 25 min    Short Term Goals: Week 1:  OT Short Term Goal 1 (Week 1): Pt will complete sit<>stand during self-care task with mod assist. OT Short Term Goal 2 (Week 1): To promote participation in ADLs, pt will engage in functional activity in standing for 2+ min without rest break. OT Short Term Goal 3 (Week 1): Pt will complete toilet transfer with mod assist. OT Short Term Goal 4 (Week 1): Pt will complete UB dressing with min assist. Week 2:     Skilled Therapeutic Interventions/Progress Updates:    1:1 Pt just finishing PT session when arrived and reporting fatigue. Pt reports just finishing in the bathroom. Pt ambulated back to recliner. Pt on 2 lit of O2. Pt participated in self care tasks and activities to build endurance: doffing shirt and washing UB and donning clean shirt. Pt was able to doff shirt and wash face but required A to wash UB due to fatigue and requiring frequent rest breaks. Pt O2 sats remained >98% on the 2 lit. Pt participated in shaving with electric razor with extra time. Pt declined walking any more at this time. [name] Legs elevated in the chair. Noted that his left toe was bleeding through bandage and sock. RN called in to address.   Pt left resting in the recliner with wife present.   Therapy Documentation Precautions:  Precautions Precautions: Fall, Other (comment) Precaution Comments: awaiting bilateral 2nd toe amputations, sacral wound, hx of Afib, hx of bil rotator cuff tears Required Braces or Orthoses: Other Brace Other Brace: prevalon boots for skin protection Restrictions Weight Bearing Restrictions: No  Pain: Pain Assessment Pain Scale: 0-10 Pain Score: 0-No pain   Therapy/Group: Individual Therapy  [name] [name] [facility] 12/14/2022,  5:09 PM

## 2022-12-14 NOTE — Progress Notes (Signed)
Physical Therapy Session Note  Patient Details  Name: [name] [name] MRN: [id number] Date of Birth: February 06, 1941  Today's Date: 12/14/2022 PT Individual Time: 0915-1015 PT Individual Time Calculation (min): 60 min   Short Term Goals: Week 1:  PT Short Term Goal 1 (Week 1): Pt will perform supine<>sit with min assist PT Short Term Goal 2 (Week 1): Pt will consistently perform sit<>stands using LRAD with min assist PT Short Term Goal 3 (Week 1): Pt will perform bed<>chair transfers using LRAD with min assist PT Short Term Goal 4 (Week 1): Pt will ambulate at least 42ft using LRAD with min assist of 1 and +2 for line management as needed PT Short Term Goal 5 (Week 1): Pt will navigate 4 steps using HRs with mod assist of 1 and +2 for safety if needed  Skilled Therapeutic Interventions/Progress Updates:   Pt received sitting in recliner chair asleep. Wife present. Pt agreeable to treatment. No complaints of pain. Pt alert and oriented x 4.  Therex: Pt was stiff upon initial attempt at standing due to having sat in recliner chair x several hours prior to PT session and unable to complete transfer. Seated LAQ x 10 performed each LE to loosen him up. STS from recliner chair initially mod A to stand to perform standing activities. Standing activities performed with longest standing tolerance at 2 min 20 sec using RW. Squats x 15 with RW, standing alternating UE reaches x 20; standing unilat march x 15 each LE, seated pillow squeeze x 20, seated pillow squeeze with LAQ x 20, seated windshield wiper exercise x 20, ISO LAQ with ankle pump x 20; gait around room x 16 ft x 2 laps with RW with PT CGA. Pt declined further gait in hallway due to fatigue. PT monitored O2 throughout with O2 being 100; wife states that the care team was discussing weaning him from O2 this morning. Seated core therex in neutral and then laterally to incorporate obliques x 15 each.  All performed to increase functional LE/core  strengthening to assist with transfers and gait. Pt without complaints of pain but with decreased endurance. Needs frequent rest breaks especially after standing activities. Needs maximal verbal cues for proper breathing technique. Pt left in recliner chair; call bell within reach; wife present, no complaints of pain after tx. OT arrived for their tx.       Therapy Documentation Precautions:  Precautions Precautions: Fall, Other (comment) Precaution Comments: awaiting bilateral 2nd toe amputations, sacral wound, hx of Afib, hx of bil rotator cuff tears Required Braces or Orthoses: Other Brace Other Brace: prevalon boots for skin protection Restrictions Weight Bearing Restrictions: No   Therapy/Group: Individual Therapy  Luna Fuse 12/14/2022, 7:16 AM

## 2022-12-14 NOTE — Progress Notes (Signed)
Continue to push diuresis, will check on tomorrow.

## 2022-12-14 NOTE — Progress Notes (Signed)
PROGRESS NOTE   Subjective/Complaints: Discussed patient's toe, wife desires amputation but ortho does not recommend due to poor vascular status/healing potential, discussed with wife  ROS: denies Afib and c/o B/L heel pain; "normal stool" yesterday afternoon, confusion improved as per wife  Objective:   DG Chest 2 View  Result Date: 12/12/2022 CLINICAL DATA:  Confusion. EXAM: CHEST - 2 VIEW COMPARISON:  Chest radiographs 12/03/2022, 12/02/2022 MS 11/28/2022 FINDINGS: Interval removal of right upper extremity PICC. Status post median sternotomy. Cardiac silhouette is again moderately to markedly enlarged. Ascending, aortic arch, descending thoracic aortic stent graft again noted. Cardiac valve replacement again noted. Moderate left pleural effusion is similar to 12/03/2022 most recent prior. Stable to slightly decreased small right pleural effusion. Associated bibasilar homogeneous and heterogeneous opacities. Left upper hemithorax surgical clips are again seen. No pneumothorax is seen. No acute skeletal abnormality. Bilateral high-riding humeral heads consistent with full-thickness superior rotator cuff tears. IMPRESSION: 1. Moderate left pleural effusion is similar to 12/03/2022 most recent prior. Stable to slightly decreased small right pleural effusion. 2. Associated bibasilar homogeneous and heterogeneous opacities. Electronically Signed   By: Neita Garnet M.D.   On: 12/12/2022 18:48   Recent Labs    12/12/22 1051  WBC 7.5  HGB 8.7*  HCT 29.7*  PLT 150   Recent Labs    12/13/22 0548 12/14/22 0633  NA 145 144  K 3.7 3.8  CL 107 105  CO2 26 29  GLUCOSE 102* 101*  BUN 34* 36*  CREATININE 1.55* 1.65*  CALCIUM 8.6* 8.4*    Intake/Output Summary (Last 24 hours) at 12/14/2022 1126 Last data filed at 12/13/2022 1800 Gross per 24 hour  Intake 666.48 ml  Output --  Net 666.48 ml     Pressure Injury 11/29/22 Heel Right Stage  3 -  Full thickness tissue loss. Subcutaneous fat may be visible but bone, tendon or muscle are NOT exposed. (Active)  11/29/22 1046  Location: Heel  Location Orientation: Right  Staging: Stage 3 -  Full thickness tissue loss. Subcutaneous fat may be visible but bone, tendon or muscle are NOT exposed.  Wound Description (Comments):   Present on Admission: Yes     Pressure Injury 12/08/22 Sacrum Deep Tissue Pressure Injury - Purple or maroon localized area of discolored intact skin or blood-filled blister due to damage of underlying soft tissue from pressure and/or shear. large area purple area extending to the co (Active)  12/08/22 1910  Location: Sacrum  Location Orientation:   Staging: Deep Tissue Pressure Injury - Purple or maroon localized area of discolored intact skin or blood-filled blister due to damage of underlying soft tissue from pressure and/or shear.  Wound Description (Comments): large area purple area extending to the coccyx & bilateral upper buttocks, with open areas. Charted by WOC as DTI in evolution on 6/18  Present on Admission: Yes    Physical Exam: Vital Signs Blood pressure (!) 111/55, pulse 79, temperature 97.9 F (36.6 C), temperature source Oral, resp. rate 15, height 5\' 10"  (1.778 m), weight 117 kg, SpO2 93 %. General: awake, alert, calm; sitting up in bed with nursing and therapy in room; NAD, less confused, BMI 37.01 HENT:  conjugate gaze; oropharynx moist- [place] O2 2.5L, breathing comfortably with O2 CV: irregular rhythm; regular rate; no JVD Pulmonary: decreased at bases- a few wheezes- rare B/L GI: soft, NT, ND, (+)BS- normoactive Psychiatric: calm Neurological: poor historian figured out after pt seen- appeared rational, but was confused about medical issues  Skin: Multiple wounds + Sacral PI, Stage 4 + R heel PI, unstageable + R 2nd toe extruded bone; no apparent drainage + L second toe wound with clean base, bloody drainage + B/l shin wounds, healing                                  MSK:      No apparent deformity. BL UE shoulder abduction limited to <90 degrees d/t bilateral rotator cuff tears.      Strength:                RUE: 3/5 SA, 3/5 EF, 3/5 EE, 4/5 WE, 4/5 FF, 4/5 FA                 LUE: 3/5 SA, 3/5 EF, 3/5 EE, 4/5 WE, 4/5 FF, 4/5 FA                 RLE: 2/5 HF, 4/5 KE, 4/5 DF, 4/5 PF                 LLE:  2/5 HF, 4/5 KE, 4/5 DF, 4/5 PF    Neurologic exam:  Cognition: AAO to person, place, time and event.  Language: Fluent, No substitutions or neoglisms. +Hypophonic, breathy vocalizations Memory: Recalls 2/3 objects at 5 minutes.  Insight: Poor insight into current condition.  Mood: Flat affect, appropriate mood.  Sensation: To light touch reduced in bilateral LEs Reflexes: 2+ in BL UE and Les  CN: 2-12 grossly intact.  Coordination: R>L UE intention tremors Spasticity: MAS 0 in all extremities.    Assessment/Plan: 1. Functional deficits which require 3+ hours per day of interdisciplinary therapy in a comprehensive inpatient rehab setting. Physiatrist is providing close team supervision and 24 hour management of active medical problems listed below. Physiatrist and rehab team continue to assess barriers to discharge/monitor patient progress toward functional and medical goals  Care Tool:  Bathing    Body parts bathed by patient: Abdomen, Face, Chest   Body parts bathed by helper: Front perineal area, Buttocks, Right upper leg, Left upper leg, Right lower leg, Left lower leg, Right arm, Left arm     Bathing assist Assist Level: Maximal Assistance - Patient 24 - 49%     Upper Body Dressing/Undressing Upper body dressing   What is the patient wearing?: Pull over shirt    Upper body assist Assist Level: Maximal Assistance - Patient 25 - 49%    Lower Body Dressing/Undressing Lower body dressing      What is the patient wearing?: Pants     Lower body assist Assist for lower body dressing:  Total Assistance - Patient < 25%     Toileting Toileting Toileting Activity did not occur Press photographer and hygiene only): N/A (no void or bm)  Toileting assist Assist for toileting: Minimal Assistance - Patient > 75%     Transfers Chair/bed transfer  Transfers assist     Chair/bed transfer assist level: Minimal Assistance - Patient > 75% Chair/bed transfer assistive device: Geologist, engineering   Ambulation assist      Assist level: Contact  Guard/Touching assist Assistive device: Walker-rolling Max distance: 69ft   Walk 10 feet activity   Assist     Assist level: Minimal Assistance - Patient > 75% Assistive device: Walker-rolling   Walk 50 feet activity   Assist Walk 50 feet with 2 turns activity did not occur: Safety/medical concerns         Walk 150 feet activity   Assist Walk 150 feet activity did not occur: Safety/medical concerns         Walk 10 feet on uneven surface  activity   Assist Walk 10 feet on uneven surfaces activity did not occur: Safety/medical concerns         Wheelchair     Assist Is the patient using a wheelchair?: Yes Type of Wheelchair: Manual    Wheelchair assist level: Dependent - Patient 0%      Wheelchair 50 feet with 2 turns activity    Assist        Assist Level: Dependent - Patient 0%   Wheelchair 150 feet activity     Assist      Assist Level: Dependent - Patient 0%   Blood pressure (!) 111/55, pulse 79, temperature 97.9 F (36.6 C), temperature source Oral, resp. rate 15, height 5\' 10"  (1.778 m), weight 117 kg, SpO2 93 %.  Medical Problem List and Plan: 1. Functional deficits secondary to debility from loculated pleural effusion and MRSA pneumonia             -patient may shower             -ELOS/Goals: 7 to 10 days, PT/OT SPV   6/24- con't CIR PT and OT 2.  Antithrombotics: -DVT/anticoagulation:  Pharmaceutical: Eliquis             -antiplatelet therapy:  Aspirin    3. Pain Management: Tylenol, tramadol as needed   6/24- denies pain except heels B/L  4. Mood/Behavior/Sleep: LCSW to evaluate and provide emotional support             -antipsychotic agents: n/a   5. Neuropsych/cognition: This patient is not capable of making decisions on his own behalf.   6. Multiple wounds             -local care to sacrum, bilateral toes (Dr. Lajoyce Corners)  -discussed that Dr. Lajoyce Corners does not recommend amputation due to poor vascular status             -continue pressure relief/Prevalon boots/mattress             - Added multivitamin with minerals and ascorbic acid 500 mg daily for wound healing on admission    7. Fluids/Electrolytes/Nutrition/Malnutrition: Routine Is and Os and follow-up chemistries             - Poor POS, no appetite; Ensure TID, consider Marinol    8: Hypotension: monitor TID and prn             -continue midodrine    9: Hyperlipidemia: continue statin   10: Urinary retention: continue flomax since still having urgency   11: combined chronic systolic/diastolic heart failure: daily weight             -continue Pacerone 200 mg daily             -on no diuretics   6/24- weight bumped up dramatically today- from 115 to 119- will recheck in AM before giving diuretics- will verify, since off due to CKD/AKI 12: chronic atrial fibrillation: rate controlled             -  on Eliquis, Pacerone   6/24- pt told me this AM- never had Afib/irregular heart rate- did research to find he does have it- and pt poor historian 13: Pneumonia with effusion/MRSA sepsis:             -continue Zyvox for 4 weeks             -O2 via [place] at 2 L             -BiPAP as needed/consult respiratory therapy  6/24- will get PICC line out since on oral ABX. Breathing "good" per pt- denies SOB   14: s/p AVR, stable thoracic aortic aneurysm: follow-up outpatient   15: AKI atop CKD IIIb: (baseline 1.2)             -slowing improving, encourage adequate hydration   Daily  BMP  16: Chronic anemia/iron deficiency: f/u CBC on Mondays. Iron supplement started   Hgb reviewed and improving  17: Hard of hearing: wife to bring in hearing aids  32. Suboptimal vitamin D: continue ergocalciferol 50,000U one per week for 7 weeks  19. Loose stool: florastor added  20. Constipation: miralax d/ced, reviewed chart- last BM 6/24  21. Morbid Obesity- BMI 35.74- encourage weight loss, encouraged high protein diet  22. UTI: start keflex, discussed 7 day course with wife  6. Confusion: likely 2/2 UTI, improved with Keflex, discussed that patient will complete 7 day course  24. Pleural effusion: daily weights, IV lasix, discussed critical care follow-up on 6/28.   25. Hypotension: will transition lasix to oral when tolerated  >50 minutes spent in examination of patient, discussion with patient and wife of patient's poor vascular status and that ortho does not recommend amputation, review of vitals and weight  LOS: 6 days A FACE TO FACE EVALUATION WAS PERFORMED  Jaana Brodt P Joselyne Spake 12/14/2022, 11:26 AM

## 2022-12-14 NOTE — Progress Notes (Signed)
Physical Therapy Session Note  Patient Details  Name: [name] [name] MRN: [id number] Date of Birth: [birth year]  Today's Date: 12/14/2022 PT Individual Time: 1425-1510 PT Individual Time Calculation (min): 45 min   Short Term Goals: Week 1:  PT Short Term Goal 1 (Week 1): Pt will perform supine<>sit with min assist PT Short Term Goal 2 (Week 1): Pt will consistently perform sit<>stands using LRAD with min assist PT Short Term Goal 3 (Week 1): Pt will perform bed<>chair transfers using LRAD with min assist PT Short Term Goal 4 (Week 1): Pt will ambulate at least 36ft using LRAD with min assist of 1 and +2 for line management as needed PT Short Term Goal 5 (Week 1): Pt will navigate 4 steps using HRs with mod assist of 1 and +2 for safety if needed  Skilled Therapeutic Interventions/Progress Updates: Patient in recliner with wife present on entrance to room. Patient alert and agreeable to PT session.   Patient reported no pain at beginning of session. Pt reported wanting to ambulate when asked what he wanted to focus on this session. Patient on 2L O2 with nasal canula donned.   Therapeutic Activity: Transfers: Pt performed sit<>stand transfers throughout session with modA at beginning of session, and then progressed to minA. Provided VC for anterior scoot. PTA lowered RW and that seemed to help patient stand to. Patient required cues to stand upright when getting up to standing.  - Patient ambulated to bathroom from [facility] with CGA for safety, and PTA managing O2 tank. Patient totalA to doff/donn brief and personal short. PTA provided posterior pericare after BM (BM noted to be dark and wife stated it was due to increase in calcium over the past 2 years). Nursing alerted and documented accordingly. Pt ambulated to recliner with CGA.   Gait Training:  Pt ambulated 71' in main gym using RW with PTA providing CGA for safety, and managing O2 tank as well as WC for safety. Pt presented with narrow  BOS and required mod cues to increase throughout. Patient also with forward flexed posture and required max cues throughout session to stand as upright as functionally able. Patient performed 2 turns with CGA and demonstrated ability to maintain neutral BOS. Pt demonstrated the following gait deviations with therapist providing the described cuing and facilitation for improvement:   NMR performed for improvements in motor control and coordination, balance, sequencing, judgement, and self confidence/ efficacy in performing all aspects of mobility at highest level of independence.   Therapeutic Exercise: Pt performed the following exercises with therapist providing the described cuing and facilitation for improvement. - dynamic standing balance with yellow theraband around B wrist with cues to keep band taut while moving cones on table in front in order to increase rotator cuff strength while maintaining standing balance. Patient performed several back and forth motions with cones in sliding motions, but was then cued to attempt to lift cones into air when moving. Pt performed said cue twice before needing to sit 2/2 reports of needing to have a BM. Pt dependently transported back to room in [facility] for time.  Patient in recliner at end of session with brakes locked, and all needs within reach with wife present.       Therapy Documentation Precautions:  Precautions Precautions: Fall, Other (comment) Precaution Comments: awaiting bilateral 2nd toe amputations, sacral wound, hx of Afib, hx of bil rotator cuff tears Required Braces or Orthoses: Other Brace Other Brace: prevalon boots for skin protection Restrictions Weight Bearing Restrictions:  No  Therapy/Group: Individual Therapy  Nalah Macioce PTA 12/14/2022, 4:25 PM

## 2022-12-15 DIAGNOSIS — R5381 Other malaise: Secondary | ICD-10-CM

## 2022-12-15 LAB — BASIC METABOLIC PANEL
Anion gap: 10 (ref 5–15)
BUN: 38 mg/dL — ABNORMAL HIGH (ref 8–23)
CO2: 28 mmol/L (ref 22–32)
Calcium: 8.3 mg/dL — ABNORMAL LOW (ref 8.9–10.3)
Chloride: 106 mmol/L (ref 98–111)
Creatinine, Ser: 1.7 mg/dL — ABNORMAL HIGH (ref 0.61–1.24)
GFR, Estimated: 40 mL/min — ABNORMAL LOW (ref >60)
Glucose, Bld: 89 mg/dL (ref 70–99)
Potassium: 4.2 mmol/L (ref 3.5–5.1)
Sodium: 144 mmol/L (ref 135–145)

## 2022-12-15 LAB — MAGNESIUM: Magnesium: 2 mg/dL (ref 1.7–2.4)

## 2022-12-15 MED ORDER — FUROSEMIDE 40 MG PO TABS
40.0000 mg | ORAL_TABLET | Freq: Every day | ORAL | Status: DC
  Administered 2022-12-16 – 2022-12-18 (×3): 40 mg via ORAL
  Filled 2022-12-15 (×3): qty 1

## 2022-12-15 NOTE — Progress Notes (Signed)
12/15/2022     I have seen and evaluated the patient for SOB   S:  No events. Breathing stable/improved. Mental status improved.   O: Blood pressure 96/66, pulse 64, temperature 97.6 F (36.4 C), temperature source Oral, resp. rate 17, height 5\' 3"  (1.6 m), weight 81.6 kg, SpO2 94 %.    No distress MMM, trachea midline Diminished breath sounds at bases more on L Move to command Globally weak   A:  Volume overload Chronic effusions related to previous PNA and now volume overload+ poor nutrition leading to third spacing Deconditioning  P:  - Lasix to PO - Monitor BMP - Patient should adjust lasix based on weight/edema - Will be available PRN  Myrla Halsted MD [facility] Prefer epic messenger for cross cover needs If after hours, please call E-link

## 2022-12-15 NOTE — Progress Notes (Signed)
Mr. [name] [name] is a pleasant 82 y.o. 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 Underwent thoracentesis by IR at [place] with 900 cc removed. Hospitalization complicated by hypotension, AKI and anemia. Cardiology consulted for hypotension and to help optimize him for TEE. Pro BNP elevated at 9000 at [facility]. Home amiodarone held due to elevated LFT. Felt not to be a candidate for TEE, plan for prolonged abx suppresion by ID. He required repeat thoracentesis on 6/16.   Currently he is back on amidarone. Remain on midodrine to keep BP up. Off of 20 mg metoprolol succinate. Still has markedly diminished breath sound in bilateral bases, consistent with pleural effusion. Based on pulmonary note, thoracentesis may only provide temporary relief since he has high probability of having recurrent pleural effusion. Otherwise he has completed a course of 40mg  IV lasix BID and is going to be transitioned back to his home med 40mg  daily oral lasix tomorrow. I think this is appropriate.   Went to see the patient today as resign-on rounding, however patient says he and his wife only requested to update [name] about his hospitalization. I offered to let Dr. Excell Seltzer who is [name] partner to see him, however patient is not interested. He just want to update [name]. He is also concerned about medication changes made at [facility] as well. Overall, he seems to be fairly  euvolemic. Will need to keep a eye on the pleural effusion though.   Will forward note to [name].  Ramond Dial PA Pager: [phone]

## 2022-12-15 NOTE — Progress Notes (Signed)
Occupational Therapy Session Note  Patient Details  Name: [name] [name] MRN: [id number] Date of Birth: 07/31/1940  Today's Date: 12/15/2022 OT Individual Time: 1300-1315 OT Individual Time Calculation (min): 15 min  and Today's Date: 12/15/2022 OT Missed Time: 15 Minutes Missed Time Reason: Other (comment) (MD assessing pt)   Short Term Goals: Week 1:  OT Short Term Goal 1 (Week 1): Pt will complete sit<>stand during self-care task with mod assist. OT Short Term Goal 2 (Week 1): To promote participation in ADLs, pt will engage in functional activity in standing for 2+ min without rest break. OT Short Term Goal 3 (Week 1): Pt will complete toilet transfer with mod assist. OT Short Term Goal 4 (Week 1): Pt will complete UB dressing with min assist.  Skilled Therapeutic Interventions/Progress Updates:    Pt resting in recliner upon arrival. OT intervention with focus on sit<>stand and functional amb with RW. O2>96% on 2L O2 during session. Sit<>stand with mod A x 3. Amb in room with RW with CGA. Rest breaks appropriately. Cardiology PA entered room for assessment. Pt missed 15 mins skilled OT services. Will attempt to make up as schedule allows.   Therapy Documentation Precautions:  Precautions Precautions: Fall, Other (comment) Precaution Comments: awaiting bilateral 2nd toe amputations, sacral wound, hx of Afib, hx of bil rotator cuff tears Required Braces or Orthoses: Other Brace Other Brace: prevalon boots for skin protection Restrictions Weight Bearing Restrictions: No General: General OT Amount of Missed Time: 15 Minutes  Pain:  Pt denies pain this afternoon    Therapy/Group: Individual Therapy  Rich Brave 12/15/2022, 2:52 PM

## 2022-12-15 NOTE — Progress Notes (Signed)
Occupational Therapy Session Note  Patient Details  Name: [name] [name] MRN: [id number] Date of Birth: October 10, 1940  Today's Date: 12/15/2022 OT Individual Time: 8657-8469 & 6295-2841 OT Individual Time Calculation (min): 70 min & 55 min   Short Term Goals: Week 1:  OT Short Term Goal 1 (Week 1): Pt will complete sit<>stand during self-care task with mod assist. OT Short Term Goal 2 (Week 1): To promote participation in ADLs, pt will engage in functional activity in standing for 2+ min without rest break. OT Short Term Goal 3 (Week 1): Pt will complete toilet transfer with mod assist. OT Short Term Goal 4 (Week 1): Pt will complete UB dressing with min assist.  Skilled Therapeutic Interventions/Progress Updates:  Session 1 Skilled OT intervention completed with focus on ADL retraining and functional ambulatory endurance. Pt received seated in w/c, agreeable to session. No pain reported.  Contacted team about wife's question about plan for bone protrusion on Rt foot as she wants to know if a protective shoe is the plan or go home with the bone in same condition and just complete wound care and protect it as much as possible. Wife reports pt was seeing podiatry PTA and OT recommended she follow up with them about follow up in outpatient per MD suggestion.  Pt requesting to complete ADLs. Seated at sink, OT had pt complete ADL tasks without assistance of his wife to assess his capabilities when challenged to do things independently first prior to wife assisting with max to total A at baseline. Cues needed for pulling shirt overhead from behind as compensatory strategy for lack of BUE ROM then able to do so with supervision. Assist provided for [place] management during UB care. Prepped wash cloth for him due to limited reaching faucet, but able to bathe with set up A for all UB areas. Cues provided for donning shirt over both arms first, then use elbows on sink for stability to donn over head. With  this technique, could donn shirt with min A vs max A.   Cues needed 90% of the time during session for anterior weight shifting in prep for sit > stand for efficiency with powering up. Stood with one hand on sink for balance, with overall light min A, doffed pants then dressed LB with overall min A needed only for standing, with increased time needed for reach behind back to donn over hips.  Pt ambulated 23ft using RW with CGA from room > gym with w/c follow for fatigue however did not need. Prolonged rest break, then ambulated same distance back to room to recliner.   Pt remained seated in recliner, with chair alarm on/activated, wife present and with all needs in reach at end of session.  Vitals -Received on 1.5L via [place], SPO2 98% -98% after lowering to 1L and ambulating 2 trials with extended rest breaks & pursed lip breathing -Remained on 1L at end of session with 98% sat   Session 2 Skilled OT intervention completed with focus on ambulatory endurance, dynamic balance and sit > stands. Pt received seated in recliner, agreeable to session. No pain reported.  Pt declined all self-care needs. Cues needed for anterior weight shifting, then light min A sit > stand using RW. Ambulated ~46ft using RW with CGA to gym. Prolonged rest break needed seated at EOM.  Pt then placed/retrieved clips from theraband on long mirror at modified level to accommodate bilateral shoulder ROM limitations to promote dynamic balance and gentle ROM of BUE needed for  independence with BADLs. Completed 1 trial each hand with seated rest break in between. OT utilized visual via cone to increase anterior weight shifting in prep for stand and with this method, pt stood x2 with CGA using RW however still min cues needed. Trialed ambulation with reduced supplemental O2 (see below) with pt ambulating with CGA using RW back to recliner (about 60ft).   Pt remained seated in recliner with BLE elevated on pillows for edema  management, with chair alarm on/activated, wife present and with all needs in reach at end of session.  Vitals -Received on 1L via [place], SPO2 98% -Titrated to 0.5L for ambulation with 97% sat after activity therefore remained on 0.5L at end of session   Therapy Documentation Precautions:  Precautions Precautions: Fall, Other (comment) Precaution Comments: awaiting bilateral 2nd toe amputations, sacral wound, hx of Afib, hx of bil rotator cuff tears Required Braces or Orthoses: Other Brace Other Brace: prevalon boots for skin protection Restrictions Weight Bearing Restrictions: No    Therapy/Group: Individual Therapy  Melvyn Novas, MS, OTR/L  12/15/2022, 3:51 PM

## 2022-12-15 NOTE — Progress Notes (Addendum)
Patient ID: [name] [name], male   DOB: May 09, 1941, 82 y.o.   MRN: [id number]  [facility] referral sent to [facility]

## 2022-12-15 NOTE — Progress Notes (Signed)
PROGRESS NOTE   Subjective/Complaints: Wife is concerned about patient's toe, discussed that surgery was not recommended Lasix has been changed to po by critical care  ROS:  c/o B/L heel pain; "normal stool" yesterday afternoon, confusion improved as per wife, +toe wound  Objective:   No results found. No results for input(s): "WBC", "HGB", "HCT", "PLT" in the last 72 hours.  Recent Labs    12/14/22 0633 12/15/22 0616  NA 144 144  K 3.8 4.2  CL 105 106  CO2 29 28  GLUCOSE 101* 89  BUN 36* 38*  CREATININE 1.65* 1.70*  CALCIUM 8.4* 8.3*    Intake/Output Summary (Last 24 hours) at 12/15/2022 1104 Last data filed at 12/15/2022 1005 Gross per 24 hour  Intake 240 ml  Output 275 ml  Net -35 ml     Pressure Injury 11/29/22 Heel Right Stage 3 -  Full thickness tissue loss. Subcutaneous fat may be visible but bone, tendon or muscle are NOT exposed. (Active)  11/29/22 1046  Location: Heel  Location Orientation: Right  Staging: Stage 3 -  Full thickness tissue loss. Subcutaneous fat may be visible but bone, tendon or muscle are NOT exposed.  Wound Description (Comments):   Present on Admission: Yes     Pressure Injury 12/08/22 Sacrum Deep Tissue Pressure Injury - Purple or maroon localized area of discolored intact skin or blood-filled blister due to damage of underlying soft tissue from pressure and/or shear. large area purple area extending to the co (Active)  12/08/22 1910  Location: Sacrum  Location Orientation:   Staging: Deep Tissue Pressure Injury - Purple or maroon localized area of discolored intact skin or blood-filled blister due to damage of underlying soft tissue from pressure and/or shear.  Wound Description (Comments): large area purple area extending to the coccyx & bilateral upper buttocks, with open areas. Charted by WOC as DTI in evolution on 6/18  Present on Admission: Yes    Physical Exam: Vital  Signs Blood pressure (!) 109/52, pulse 92, temperature 98 F (36.7 C), temperature source Oral, resp. rate 16, height 5\' 10"  (1.778 m), weight 119 kg, SpO2 98 %. General: awake, alert, calm; sitting up in bed with nursing and therapy in room; NAD, less confused, BMI 37.64 HENT: conjugate gaze; oropharynx moist- [place] O2 2.5L, breathing comfortably with O2 CV: irregular rhythm; regular rate; no JVD Pulmonary: decreased at bases- a few wheezes- rare B/L GI: soft, NT, ND, (+)BS- normoactive Psychiatric: calm Neurological: poor historian figured out after pt seen- appeared rational, but was confused about medical issues, mental status improved Skin: Multiple wounds + Sacral PI, Stage 4 + R heel PI, unstageable + R 2nd toe extruded bone; no apparent drainage + L second toe wound with clean base, bloody drainage + B/l shin wounds, healing                                 MSK:      No apparent deformity. BL UE shoulder abduction limited to <90 degrees d/t bilateral rotator cuff tears.      Strength:  RUE: 3/5 SA, 3/5 EF, 3/5 EE, 4/5 WE, 4/5 FF, 4/5 FA                 LUE: 3/5 SA, 3/5 EF, 3/5 EE, 4/5 WE, 4/5 FF, 4/5 FA                 RLE: 2/5 HF, 4/5 KE, 4/5 DF, 4/5 PF                 LLE:  2/5 HF, 4/5 KE, 4/5 DF, 4/5 PF    Neurologic exam:  Cognition: AAO to person, place, time and event.  Language: Fluent, No substitutions or neoglisms. +Hypophonic, breathy vocalizations Memory: Recalls 2/3 objects at 5 minutes.  Insight: Poor insight into current condition.  Mood: Flat affect, appropriate mood.  Sensation: To light touch reduced in bilateral LEs Reflexes: 2+ in BL UE and Les  CN: 2-12 grossly intact.  Coordination: R>L UE intention tremors Spasticity: MAS 0 in all extremities.    Assessment/Plan: 1. Functional deficits which require 3+ hours per day of interdisciplinary therapy in a comprehensive inpatient rehab setting. Physiatrist is providing close  team supervision and 24 hour management of active medical problems listed below. Physiatrist and rehab team continue to assess barriers to discharge/monitor patient progress toward functional and medical goals  Care Tool:  Bathing    Body parts bathed by patient: Abdomen, Face, Chest   Body parts bathed by helper: Front perineal area, Buttocks, Right upper leg, Left upper leg, Right lower leg, Left lower leg, Right arm, Left arm     Bathing assist Assist Level: Maximal Assistance - Patient 24 - 49%     Upper Body Dressing/Undressing Upper body dressing   What is the patient wearing?: Pull over shirt    Upper body assist Assist Level: Maximal Assistance - Patient 25 - 49%    Lower Body Dressing/Undressing Lower body dressing      What is the patient wearing?: Pants     Lower body assist Assist for lower body dressing: Total Assistance - Patient < 25%     Toileting Toileting Toileting Activity did not occur Press photographer and hygiene only): N/A (no void or bm)  Toileting assist Assist for toileting: Minimal Assistance - Patient > 75%     Transfers Chair/bed transfer  Transfers assist     Chair/bed transfer assist level: Minimal Assistance - Patient > 75% Chair/bed transfer assistive device: Arboriculturist assist      Assist level: Contact Guard/Touching assist Assistive device: Walker-rolling Max distance: 19ft   Walk 10 feet activity   Assist     Assist level: Minimal Assistance - Patient > 75% Assistive device: Walker-rolling   Walk 50 feet activity   Assist Walk 50 feet with 2 turns activity did not occur: Safety/medical concerns         Walk 150 feet activity   Assist Walk 150 feet activity did not occur: Safety/medical concerns         Walk 10 feet on uneven surface  activity   Assist Walk 10 feet on uneven surfaces activity did not occur: Safety/medical concerns          Wheelchair     Assist Is the patient using a wheelchair?: Yes Type of Wheelchair: Manual    Wheelchair assist level: Dependent - Patient 0%      Wheelchair 50 feet with 2 turns activity    Assist  Assist Level: Dependent - Patient 0%   Wheelchair 150 feet activity     Assist      Assist Level: Dependent - Patient 0%   Blood pressure (!) 109/52, pulse 92, temperature 98 F (36.7 C), temperature source Oral, resp. rate 16, height 5\' 10"  (1.778 m), weight 119 kg, SpO2 98 %.  Medical Problem List and Plan: 1. Functional deficits secondary to debility from loculated pleural effusion and MRSA pneumonia             -patient may shower             -ELOS/Goals: 7 to 10 days, PT/OT SPV   6/24- con't CIR PT and OT 2.  Antithrombotics: -DVT/anticoagulation:  Pharmaceutical: Eliquis             -antiplatelet therapy: Aspirin    3. Pain Management: Tylenol, tramadol as needed   6/24- denies pain except heels B/L  4. Mood/Behavior/Sleep: LCSW to evaluate and provide emotional support             -antipsychotic agents: n/a   5. Neuropsych/cognition: This patient is not capable of making decisions on his own behalf.   6. Multiple wounds             -local care to sacrum, bilateral toes (Dr. Lajoyce Corners)  -discussed that Dr. Lajoyce Corners does not recommend amputation due to poor vascular status             -continue pressure relief/Prevalon boots/mattress             - Added multivitamin with minerals and ascorbic acid 500 mg daily for wound healing on admission    7. Fluids/Electrolytes/Nutrition/Malnutrition: Routine Is and Os and follow-up chemistries             - Poor POS, no appetite; Ensure TID, consider Marinol    8: Hypotension: monitor TID and prn             -continue midodrine    9: Hyperlipidemia: continue statin   10: Urinary retention: continue flomax since still having urgency   11: combined chronic systolic/diastolic heart failure: daily weight              -continue Pacerone 200 mg daily             -on no diuretics   6/24- weight bumped up dramatically today- from 115 to 119- will recheck in AM before giving diuretics- will verify, since off due to CKD/AKI 12: chronic atrial fibrillation: rate controlled             -on Eliquis, Pacerone   6/24- pt told me this AM- never had Afib/irregular heart rate- did research to find he does have it- and pt poor historian 13: Pneumonia with effusion/MRSA sepsis:             -continue Zyvox for 4 weeks             -O2 via [place] at 2 L             -BiPAP as needed/consult respiratory therapy  6/24- will get PICC line out since on oral ABX. Breathing "good" per pt- denies SOB   14: s/p AVR, stable thoracic aortic aneurysm: follow-up outpatient   15: AKI atop CKD IIIb: (baseline 1.2)             -slowing improving, encourage adequate hydration   Daily BMP  -Cr reviewed and worsening, likely will improve with transition of lasix from  IV to oral  16: Chronic anemia/iron deficiency: f/u CBC on Mondays. Iron supplement started   Hgb reviewed and improving  17: Hard of hearing: wife to bring in hearing aids  56. Suboptimal vitamin D: continue ergocalciferol 50,000U one per week for 7 weeks  19. Loose stool: florastor added  20. Constipation: miralax d/ced, reviewed chart- last BM 6/24  21. Morbid Obesity- BMI 35.74- encourage weight loss, encouraged high protein diet  22. UTI: start keflex, discussed 7 day course with wife  76. Confusion: likely 2/2 UTI, improved with Keflex, discussed that patient will complete 7 day course  24. Pleural effusion: transition lasix to oral   25. Hypotension: transition lasix to oral, critical care note reviewed.   >50 minutes spent in examination of patient and discussion of current status with patient and wife, review of critical care note, creatinine reviewed and increased, should improve with transition of lasix to oral   LOS: 7 days A FACE TO FACE  EVALUATION WAS PERFORMED  Ronel Rodeheaver P Berlie Persky 12/15/2022, 11:04 AM

## 2022-12-15 NOTE — Progress Notes (Signed)
Patient ID: [name] [name], male   DOB: [birth year], 82 y.o.   MRN: [id number]  Wheelchair, ROHO cushion and Bedside Commode ordered through [employer].

## 2022-12-15 NOTE — Progress Notes (Signed)
Physical Therapy Session Note  Patient Details  Name: [name] [name] MRN: [id number] Date of Birth: [birth year]  Today's Date: 12/15/2022 PT Individual Time: 0950-1018 PT Individual Time Calculation (min): 28 min   Short Term Goals: Week 1:  PT Short Term Goal 1 (Week 1): Pt will perform supine<>sit with min assist PT Short Term Goal 2 (Week 1): Pt will consistently perform sit<>stands using LRAD with min assist PT Short Term Goal 3 (Week 1): Pt will perform bed<>chair transfers using LRAD with min assist PT Short Term Goal 4 (Week 1): Pt will ambulate at least 39ft using LRAD with min assist of 1 and +2 for line management as needed PT Short Term Goal 5 (Week 1): Pt will navigate 4 steps using HRs with mod assist of 1 and +2 for safety if needed  Skilled Therapeutic Interventions/Progress Updates: Patient in recliner on entrance to room with 1.5L O2 nasal cannula donned and wife present. Patient alert and agreeable to PT session.   Patient reported no pain at beginning of session.  Therapeutic Activity: Transfers: Pt noted to require more assistance to stand at first. Pt provided with encouragement after stating "I'm sorry" when unable to stand without increased assistance. Patient consoled and motivated that pt is doing more work than pt realizes. Seated therex initiated in order to promote LE musculature/cardiovascular activation. Patient then cud to use one UE on RW and the other on recliner arm rest to push. Patient modA to stand and ambulation from room began.  Gait Training:  Pt ambulated 109' using RW with PTA providing CGA for safety and navigation of O2 tank (1L) while wife follows with WC. Patient with forward flexed posture, narrow BOS and provided with cues to extend at hips and to widen BOS (mod cues required as patient gravitates towards narrow BOS). Patient also with decreased cadence, but push self further in ambulatory trial in comparison to yesterday's session. Patient HR  and O2 checked (patient also cued to perform pursed lip breathing per slight SOB report) and was transported back to room dependently.  Therapeutic Exercise: Pt performed the following exercises in order to increase B LE blood flow for sit to stand power up. - HS curl x 8 bilaterally with PTA providing manual resistance (min resistance) - LAQ x 5 bilaterally with PTA providing manual resistance (min resistance).  Patient in [facility] at end of session with brakes locked, wife present and all needs within reach.      Therapy Documentation Precautions:  Precautions Precautions: Fall, Other (comment) Precaution Comments: awaiting bilateral 2nd toe amputations, sacral wound, hx of Afib, hx of bil rotator cuff tears Required Braces or Orthoses: Other Brace Other Brace: prevalon boots for skin protection Restrictions Weight Bearing Restrictions: No  Vitals: HR - 115 SpO2 - 97%  Therapy/Group: Individual Therapy  Drishti Pepperman PTA 12/15/2022, 4:21 PM

## 2022-12-16 LAB — BASIC METABOLIC PANEL
Anion gap: 10 (ref 5–15)
BUN: 40 mg/dL — ABNORMAL HIGH (ref 8–23)
CO2: 28 mmol/L (ref 22–32)
Calcium: 8.6 mg/dL — ABNORMAL LOW (ref 8.9–10.3)
Chloride: 107 mmol/L (ref 98–111)
Creatinine, Ser: 1.65 mg/dL — ABNORMAL HIGH (ref 0.61–1.24)
GFR, Estimated: 41 mL/min — ABNORMAL LOW (ref >60)
Glucose, Bld: 91 mg/dL (ref 70–99)
Potassium: 3.8 mmol/L (ref 3.5–5.1)
Sodium: 145 mmol/L (ref 135–145)

## 2022-12-16 LAB — MAGNESIUM: Magnesium: 2 mg/dL (ref 1.7–2.4)

## 2022-12-16 NOTE — Plan of Care (Signed)
  Problem: Consults Goal: RH GENERAL PATIENT EDUCATION Description: See Patient Education module for education specifics. Outcome: Progressing   Problem: RH BOWEL ELIMINATION Goal: RH STG MANAGE BOWEL WITH ASSISTANCE Description: STG Manage Bowel with toileting Assistance. Outcome: Progressing Goal: RH STG MANAGE BOWEL W/MEDICATION W/ASSISTANCE Description: STG Manage Bowel with Medication with mod I Assistance. Outcome: Progressing   Problem: RH BLADDER ELIMINATION Goal: RH STG MANAGE BLADDER WITH ASSISTANCE Description: STG Manage Bladder With toileting Assistance Outcome: Progressing Goal: RH STG MANAGE BLADDER WITH MEDICATION WITH ASSISTANCE Description: STG Manage Bladder With Medication With mod I Assistance. Outcome: Progressing   Problem: RH SAFETY Goal: RH STG ADHERE TO SAFETY PRECAUTIONS W/ASSISTANCE/DEVICE Description: STG Adhere to Safety Precautions With cues Assistance/Device. Outcome: Progressing   Problem: RH PAIN MANAGEMENT Goal: RH STG PAIN MANAGED AT OR BELOW [name] PAIN GOAL Description: Manage < 4 with prns Outcome: Progressing   Problem: RH KNOWLEDGE DEFICIT GENERAL Goal: RH STG INCREASE KNOWLEDGE OF SELF CARE AFTER HOSPITALIZATION Description: Patient and wife will be able to manage care using educational resources independently Outcome: Progressing   Problem: Fluid Volume: Goal: Hemodynamic stability will improve Outcome: Progressing   Problem: Clinical Measurements: Goal: Diagnostic test results will improve Outcome: Progressing Goal: Signs and symptoms of infection will decrease Outcome: Progressing   Problem: Respiratory: Goal: Ability to maintain adequate ventilation will improve Outcome: Progressing

## 2022-12-16 NOTE — Progress Notes (Signed)
Occupational Therapy Session Note  Patient Details  Name: [name] [name] MRN: [id number] Date of Birth: 13-Aug-1940  Today's Date: 12/16/2022 OT Individual Time: 0920-1015 OT Individual Time Calculation (min): 55 min    Short Term Goals: Week 1:  OT Short Term Goal 1 (Week 1): Pt will complete sit<>stand during self-care task with mod assist. OT Short Term Goal 2 (Week 1): To promote participation in ADLs, pt will engage in functional activity in standing for 2+ min without rest break. OT Short Term Goal 3 (Week 1): Pt will complete toilet transfer with mod assist. OT Short Term Goal 4 (Week 1): Pt will complete UB dressing with min assist.  Skilled Therapeutic Interventions/Progress Updates:  Skilled OT intervention completed with focus on symptom and O2 management, BLE exercises and sit > stands. Pt received seated in recliner with family present, agreeable to session. No pain reported.  Pt with episode of emesis; OT issued emesis bag and diet drink for nausea management. Nurse notified and in room to issue IV meds.  Pt then indicated dizziness and trouble breathing. Vitals assessed with pt on 0.5 L via [place] with 91% sat. Per 96% order, OT increased supplemental O2 however required 3L to increase to 95% however did not remain with frequent desat to 76% despite warming of fingers, pursed lip breathing. Nurse notified and in room. Nurse recommended breathing treatment however HR too elevated. Switched pt back to wall unit on 2L with rebound to 96% with potential need to switch out tank/[place] for out of room mobility.   Pt agreeable to in room exercises. Completed 2x10 leg raises bilaterally with visual cue needed for full ROM. Then OT applied theraband to RW for visual cue for anterior weight shift as cone in gym yesterday helped. Cues still needed however better anterior weight shift noted. Still required heavy min A to power up with RW and maintained standing for about 30 sec prior to report of  fatigue. O2 96% on 2L and remained on 2L. Pt remained seated in recliner, with family present, and with all needs in reach at end of session.   Therapy Documentation Precautions:  Precautions Precautions: Fall, Other (comment) Precaution Comments: awaiting bilateral 2nd toe amputations, sacral wound, hx of Afib, hx of bil rotator cuff tears Required Braces or Orthoses: Other Brace Other Brace: prevalon boots for skin protection Restrictions Weight Bearing Restrictions: No    Therapy/Group: Individual Therapy  Melvyn Novas, MS, OTR/L  12/16/2022, 12:50 PM

## 2022-12-16 NOTE — Progress Notes (Signed)
PROGRESS NOTE   Subjective/Complaints:  Pt reports only feet hurt, but meds help.  Wants ot go home; but knows not due yet.   LBM this AM on bedpan.   Thought had therapy today but didn't get schedule.  ROS:  Pt denies SOB, abd pain, CP, N/V/C/D, and vision changes  Except for HPI Objective:   No results found. No results for input(s): "WBC", "HGB", "HCT", "PLT" in the last 72 hours.  Recent Labs    12/15/22 0616 12/16/22 0532  NA 144 145  K 4.2 3.8  CL 106 107  CO2 28 28  GLUCOSE 89 91  BUN 38* 40*  CREATININE 1.70* 1.65*  CALCIUM 8.3* 8.6*    Intake/Output Summary (Last 24 hours) at 12/16/2022 0951 Last data filed at 12/16/2022 0801 Gross per 24 hour  Intake 600 ml  Output 700 ml  Net -100 ml     Pressure Injury 11/29/22 Heel Right Stage 3 -  Full thickness tissue loss. Subcutaneous fat may be visible but bone, tendon or muscle are NOT exposed. (Active)  11/29/22 1046  Location: Heel  Location Orientation: Right  Staging: Stage 3 -  Full thickness tissue loss. Subcutaneous fat may be visible but bone, tendon or muscle are NOT exposed.  Wound Description (Comments):   Present on Admission: Yes     Pressure Injury 12/08/22 Sacrum Deep Tissue Pressure Injury - Purple or maroon localized area of discolored intact skin or blood-filled blister due to damage of underlying soft tissue from pressure and/or shear. large area purple area extending to the co (Active)  12/08/22 1910  Location: Sacrum  Location Orientation:   Staging: Deep Tissue Pressure Injury - Purple or maroon localized area of discolored intact skin or blood-filled blister due to damage of underlying soft tissue from pressure and/or shear.  Wound Description (Comments): large area purple area extending to the coccyx & bilateral upper buttocks, with open areas. Charted by WOC as DTI in evolution on 6/18  Present on Admission: Yes    Physical  Exam: Vital Signs Blood pressure (!) 115/57, pulse 82, temperature (!) 97.4 F (36.3 C), resp. rate 18, height 5\' 10"  (1.778 m), weight 116 kg, SpO2 94 %.    General: awake, alert, appropriate, sitting up in bed; NAD HENT: conjugate gaze; oropharynx dry CV: regular rate; no JVD Pulmonary: CTA B/L; no W/R/R- decreased at bases- on 1L O2 by [place] GI: soft, NT, ND, (+)BS- slightly hypoactive Psychiatric: appropriate- more interactive Neurological: less confused Skin: Multiple wounds + Sacral PI, Stage 4 + R heel PI, unstageable + R 2nd toe extruded bone; no apparent drainage + L second toe wound with clean base, bloody drainage + B/l shin wounds, healing                                 MSK:      No apparent deformity. BL UE shoulder abduction limited to <90 degrees d/t bilateral rotator cuff tears.      Strength:                RUE: 3/5 SA, 3/5 EF, 3/5  EE, 4/5 WE, 4/5 FF, 4/5 FA                 LUE: 3/5 SA, 3/5 EF, 3/5 EE, 4/5 WE, 4/5 FF, 4/5 FA                 RLE: 2/5 HF, 4/5 KE, 4/5 DF, 4/5 PF                 LLE:  2/5 HF, 4/5 KE, 4/5 DF, 4/5 PF    Neurologic exam:  Cognition: AAO to person, place, time and event.  Language: Fluent, No substitutions or neoglisms. +Hypophonic, breathy vocalizations Memory: Recalls 2/3 objects at 5 minutes.  Insight: Poor insight into current condition.  Mood: Flat affect, appropriate mood.  Sensation: To light touch reduced in bilateral LEs Reflexes: 2+ in BL UE and Les  CN: 2-12 grossly intact.  Coordination: R>L UE intention tremors Spasticity: MAS 0 in all extremities.    Assessment/Plan: 1. Functional deficits which require 3+ hours per day of interdisciplinary therapy in a comprehensive inpatient rehab setting. Physiatrist is providing close team supervision and 24 hour management of active medical problems listed below. Physiatrist and rehab team continue to assess barriers to discharge/monitor patient progress  toward functional and medical goals  Care Tool:  Bathing    Body parts bathed by patient: Abdomen, Face, Chest   Body parts bathed by helper: Front perineal area, Buttocks, Right upper leg, Left upper leg, Right lower leg, Left lower leg, Right arm, Left arm     Bathing assist Assist Level: Maximal Assistance - Patient 24 - 49%     Upper Body Dressing/Undressing Upper body dressing   What is the patient wearing?: Pull over shirt    Upper body assist Assist Level: Maximal Assistance - Patient 25 - 49%    Lower Body Dressing/Undressing Lower body dressing      What is the patient wearing?: Pants     Lower body assist Assist for lower body dressing: Total Assistance - Patient < 25%     Toileting Toileting Toileting Activity did not occur Press photographer and hygiene only): N/A (no void or bm)  Toileting assist Assist for toileting: Minimal Assistance - Patient > 75%     Transfers Chair/bed transfer  Transfers assist     Chair/bed transfer assist level: Minimal Assistance - Patient > 75% Chair/bed transfer assistive device: Arboriculturist assist      Assist level: Contact Guard/Touching assist Assistive device: Walker-rolling Max distance: 62ft   Walk 10 feet activity   Assist     Assist level: Minimal Assistance - Patient > 75% Assistive device: Walker-rolling   Walk 50 feet activity   Assist Walk 50 feet with 2 turns activity did not occur: Safety/medical concerns         Walk 150 feet activity   Assist Walk 150 feet activity did not occur: Safety/medical concerns         Walk 10 feet on uneven surface  activity   Assist Walk 10 feet on uneven surfaces activity did not occur: Safety/medical concerns         Wheelchair     Assist Is the patient using a wheelchair?: Yes Type of Wheelchair: Manual    Wheelchair assist level: Dependent - Patient 0%      Wheelchair 50 feet with 2 turns  activity    Assist        Assist Level: Dependent -  Patient 0%   Wheelchair 150 feet activity     Assist      Assist Level: Dependent - Patient 0%   Blood pressure (!) 115/57, pulse 82, temperature (!) 97.4 F (36.3 C), resp. rate 18, height 5\' 10"  (1.778 m), weight 116 kg, SpO2 94 %.  Medical Problem List and Plan: 1. Functional deficits secondary to debility from loculated pleural effusion and MRSA pneumonia             -patient may shower             -ELOS/Goals: 7 to 10 days, PT/OT SPV   Con't CIR PT and OT 2.  Antithrombotics: -DVT/anticoagulation:  Pharmaceutical: Eliquis             -antiplatelet therapy: Aspirin    3. Pain Management: Tylenol, tramadol as needed   6/29- denies pain except feet B/L- meds help  4. Mood/Behavior/Sleep: LCSW to evaluate and provide emotional support             -antipsychotic agents: n/a   5. Neuropsych/cognition: This patient is not capable of making decisions on his own behalf.   6. Multiple wounds             -local care to sacrum, bilateral toes (Dr. Lajoyce Corners)  -discussed that Dr. Lajoyce Corners does not recommend amputation due to poor vascular status             -continue pressure relief/Prevalon boots/mattress             - Added multivitamin with minerals and ascorbic acid 500 mg daily for wound healing on admission    7. Fluids/Electrolytes/Nutrition/Malnutrition: Routine Is and Os and follow-up chemistries             - Poor POS, no appetite; Ensure TID, consider Marinol    8: Hypotension: monitor TID and prn             -continue midodrine    9: Hyperlipidemia: continue statin   10: Urinary retention: continue flomax since still having urgency   11: combined chronic systolic/diastolic heart failure: daily weight             -continue Pacerone 200 mg daily             -on no diuretics   6/24- weight bumped up dramatically today- from 115 to 119- will recheck in AM before giving diuretics- will verify, since off due to  CKD/AKI  6/29- Weight was 119 kg yesterday but 116 kg today- more his baseline per review of weights in last 8 days.  12: chronic atrial fibrillation: rate controlled             -on Eliquis, Pacerone   6/24- pt told me this AM- never had Afib/irregular heart rate- did research to find he does have it- and pt poor historian 13: Pneumonia with effusion/MRSA sepsis:             -continue Zyvox for 4 weeks             -O2 via [place] at 2 L             -BiPAP as needed/consult respiratory therapy  6/24- will get PICC line out since on oral ABX. 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 Iron supplement started   Hgb reviewed and improving  17: Hard of hearing: wife to bring in hearing aids  21. Suboptimal vitamin D: continue ergocalciferol 50,000U one per week for 7 weeks  19. Loose stool: florastor added  20. Constipation: miralax d/ced, reviewed chart- last BM 6/24  6/29- LBM this AM 21. Morbid Obesity- BMI 35.74- encourage weight loss, encouraged high protein diet  6/29- BMI up to 36.69 22. UTI: start keflex, discussed 7 day course with wife  3. Confusion: likely 2/2 UTI, improved with Keflex, discussed that patient will complete 7 day course  24. Pleural effusion: transition lasix to oral   25. Hypotension: transition lasix to oral, critical care note reviewed.     I spent a total of 40   minutes on total care today- >50% coordination of care- due to d/w pt about labs- which I reviewed as well as weights for longer period- also reviewed therapy notes also  d/w nursing- they will check if has therapy   LOS: 8 days A FACE TO FACE EVALUATION WAS PERFORMED  [name] [name] 12/16/2022, 9:51 AM

## 2022-12-17 LAB — BASIC METABOLIC PANEL
Anion gap: 8 (ref 5–15)
BUN: 42 mg/dL — ABNORMAL HIGH (ref 8–23)
CO2: 30 mmol/L (ref 22–32)
Calcium: 8.7 mg/dL — ABNORMAL LOW (ref 8.9–10.3)
Chloride: 107 mmol/L (ref 98–111)
Creatinine, Ser: 1.74 mg/dL — ABNORMAL HIGH (ref 0.61–1.24)
GFR, Estimated: 38 mL/min — ABNORMAL LOW (ref >60)
Glucose, Bld: 106 mg/dL — ABNORMAL HIGH (ref 70–99)
Potassium: 4.7 mmol/L (ref 3.5–5.1)
Sodium: 145 mmol/L (ref 135–145)

## 2022-12-17 LAB — MAGNESIUM: Magnesium: 2.2 mg/dL (ref 1.7–2.4)

## 2022-12-17 NOTE — Progress Notes (Signed)
PROGRESS NOTE   Subjective/Complaints:  Pt reports wants to go home- doesn't know why here.   On 2L O2 today vs 1 L yesterday AM  No specific complaints.  ROS:    Pt denies SOB, abd pain, CP, N/V/C/D, and vision changes   Except for HPI Objective:   No results found. No results for input(s): "WBC", "HGB", "HCT", "PLT" in the last 72 hours.  Recent Labs    12/16/22 0532 12/17/22 0614  NA 145 145  K 3.8 4.7  CL 107 107  CO2 28 30  GLUCOSE 91 106*  BUN 40* 42*  CREATININE 1.65* 1.74*  CALCIUM 8.6* 8.7*    Intake/Output Summary (Last 24 hours) at 12/17/2022 1649 Last data filed at 12/17/2022 1237 Gross per 24 hour  Intake 960 ml  Output 200 ml  Net 760 ml     Pressure Injury 11/29/22 Heel Right Stage 3 -  Full thickness tissue loss. Subcutaneous fat may be visible but bone, tendon or muscle are NOT exposed. (Active)  11/29/22 1046  Location: Heel  Location Orientation: Right  Staging: Stage 3 -  Full thickness tissue loss. Subcutaneous fat may be visible but bone, tendon or muscle are NOT exposed.  Wound Description (Comments):   Present on Admission: Yes     Pressure Injury 12/08/22 Sacrum Deep Tissue Pressure Injury - Purple or maroon localized area of discolored intact skin or blood-filled blister due to damage of underlying soft tissue from pressure and/or shear. large area purple area extending to the co (Active)  12/08/22 1910  Location: Sacrum  Location Orientation:   Staging: Deep Tissue Pressure Injury - Purple or maroon localized area of discolored intact skin or blood-filled blister due to damage of underlying soft tissue from pressure and/or shear.  Wound Description (Comments): large area purple area extending to the coccyx & bilateral upper buttocks, with open areas. Charted by WOC as DTI in evolution on 6/18  Present on Admission: Yes    Physical Exam: Vital Signs Blood pressure 104/64,  pulse 95, temperature 98.2 F (36.8 C), temperature source Oral, resp. rate 19, height 5\' 10"  (1.778 m), weight 113 kg, SpO2 100 %.    General: awake, alert, sitting up in bed;  NAD HENT: conjugate gaze; oropharynx moist CV: regular rate; no JVD Pulmonary: - decreased at bases- wearing 2L O2 by [place]- not in nose GI: soft, NT, ND, (+)BS Psychiatric: calm Neurological: alert, but not sure why here Skin: Multiple wounds + Sacral PI, Stage 4 + R heel PI, unstageable + R 2nd toe extruded bone; no apparent drainage + L second toe wound with clean base, bloody drainage + B/l shin wounds, healing                                 MSK:      No apparent deformity. BL UE shoulder abduction limited to <90 degrees d/t bilateral rotator cuff tears.      Strength:                RUE: 3/5 SA, 3/5 EF, 3/5 EE, 4/5 WE, 4/5 FF, 4/5  FA                 LUE: 3/5 SA, 3/5 EF, 3/5 EE, 4/5 WE, 4/5 FF, 4/5 FA                 RLE: 2/5 HF, 4/5 KE, 4/5 DF, 4/5 PF                 LLE:  2/5 HF, 4/5 KE, 4/5 DF, 4/5 PF    Neurologic exam:  Cognition: AAO to person, place, time and event.  Language: Fluent, No substitutions or neoglisms. +Hypophonic, breathy vocalizations Memory: Recalls 2/3 objects at 5 minutes.  Insight: Poor insight into current condition.  Mood: Flat affect, appropriate mood.  Sensation: To light touch reduced in bilateral LEs Reflexes: 2+ in BL UE and Les  CN: 2-12 grossly intact.  Coordination: R>L UE intention tremors Spasticity: MAS 0 in all extremities.    Assessment/Plan: 1. Functional deficits which require 3+ hours per day of interdisciplinary therapy in a comprehensive inpatient rehab setting. Physiatrist is providing close team supervision and 24 hour management of active medical problems listed below. Physiatrist and rehab team continue to assess barriers to discharge/monitor patient progress toward functional and medical goals  Care Tool:  Bathing    Body  parts bathed by patient: Abdomen, Face, Chest   Body parts bathed by helper: Front perineal area, Buttocks, Right upper leg, Left upper leg, Right lower leg, Left lower leg, Right arm, Left arm     Bathing assist Assist Level: Maximal Assistance - Patient 24 - 49%     Upper Body Dressing/Undressing Upper body dressing   What is the patient wearing?: Pull over shirt    Upper body assist Assist Level: Maximal Assistance - Patient 25 - 49%    Lower Body Dressing/Undressing Lower body dressing      What is the patient wearing?: Pants     Lower body assist Assist for lower body dressing: Total Assistance - Patient < 25%     Toileting Toileting Toileting Activity did not occur Press photographer and hygiene only): N/A (no void or bm)  Toileting assist Assist for toileting: Minimal Assistance - Patient > 75%     Transfers Chair/bed transfer  Transfers assist     Chair/bed transfer assist level: Minimal Assistance - Patient > 75% Chair/bed transfer assistive device: Arboriculturist assist      Assist level: Contact Guard/Touching assist Assistive device: Walker-rolling Max distance: 72ft   Walk 10 feet activity   Assist     Assist level: Minimal Assistance - Patient > 75% Assistive device: Walker-rolling   Walk 50 feet activity   Assist Walk 50 feet with 2 turns activity did not occur: Safety/medical concerns         Walk 150 feet activity   Assist Walk 150 feet activity did not occur: Safety/medical concerns         Walk 10 feet on uneven surface  activity   Assist Walk 10 feet on uneven surfaces activity did not occur: Safety/medical concerns         Wheelchair     Assist Is the patient using a wheelchair?: Yes Type of Wheelchair: Manual    Wheelchair assist level: Dependent - Patient 0%      Wheelchair 50 feet with 2 turns activity    Assist        Assist Level: Dependent - Patient  0%   Wheelchair 150  feet activity     Assist      Assist Level: Dependent - Patient 0%   Blood pressure 104/64, pulse 95, temperature 98.2 F (36.8 C), temperature source Oral, resp. rate 19, height 5\' 10"  (1.778 m), weight 113 kg, SpO2 100 %.  Medical Problem List and Plan: 1. Functional deficits secondary to debility from loculated pleural effusion and MRSA pneumonia             -patient may shower             -ELOS/Goals: 7 to 10 days, PT/OT SPV   Con't CIR 2.  Antithrombotics: -DVT/anticoagulation:  Pharmaceutical: Eliquis             -antiplatelet therapy: Aspirin    3. Pain Management: Tylenol, tramadol as needed   6/29- denies pain except feet B/L- meds help  4. Mood/Behavior/Sleep: LCSW to evaluate and provide emotional support             -antipsychotic agents: n/a   5. Neuropsych/cognition: This patient is not capable of making decisions on his own behalf.   6. Multiple wounds             -local care to sacrum, bilateral toes (Dr. Lajoyce Corners)  -discussed that Dr. Lajoyce Corners does not recommend amputation due to poor vascular status             -continue pressure relief/Prevalon boots/mattress             - Added multivitamin with minerals and ascorbic acid 500 mg daily for wound healing on admission    7. Fluids/Electrolytes/Nutrition/Malnutrition: Routine Is and Os and follow-up chemistries             - Poor POS, no appetite; Ensure TID, consider Marinol    8: Hypotension: monitor TID and prn             -continue midodrine    6/30- BP soft, but no hypotension per nursing/not dizzy 9: Hyperlipidemia: continue statin   10: Urinary retention: continue flomax since still having urgency   11: combined chronic systolic/diastolic heart failure: daily weight             -continue Pacerone 200 mg daily             -on no diuretics   6/24- weight bumped up dramatically today- from 115 to 119- will recheck in AM before giving diuretics- will verify, since off due to  CKD/AKI  6/29- Weight was 119 kg yesterday but 116 kg today- more his baseline per review of weights in last 8 days.   6/30- weight down more due to Lasix- down to 113 kg- however Cr up to 1.7 and BUN up to 42- will recheck CMP in AM and BNP and allow team to decide plan 12: chronic atrial fibrillation: rate controlled             -on Eliquis, Pacerone   6/24- pt told me this AM- never had Afib/irregular heart rate- did research to find he does have it- and pt poor historian 13: Pneumonia with effusion/MRSA sepsis:             -continue Zyvox for 4 weeks             -O2 via [place] at 2 L             -BiPAP as needed/consult respiratory therapy  6/24- will get PICC line out since on oral ABX. 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 Iron supplement started   Hgb reviewed and improving  17: Hard of hearing: wife to bring in hearing aids  4. Suboptimal vitamin D: continue ergocalciferol 50,000U one per week for 7 weeks  19. Loose stool: florastor added  20. Constipation: miralax d/ced, reviewed chart- last BM 6/24  6/29- LBM this AM 21. Morbid Obesity- BMI 35.74- encourage weight loss, encouraged high protein diet  6/29- BMI up to 36.69 22. UTI: start keflex, discussed 7 day course with wife  73. Confusion: likely 2/2 UTI, improved with Keflex, discussed that patient will complete 7 day course  24. Pleural effusion: transition lasix to oral   25.  Hypotension: transition lasix to oral, critical care note reviewed.    I spent a total of 38    minutes on total care today- >50% coordination of care- due to  Complex medical issues- as detailed above  LOS: 9 days A FACE TO FACE EVALUATION WAS PERFORMED  [name] [name] 12/17/2022, 4:49 PM

## 2022-12-18 LAB — CBC
HCT: 25.2 % — ABNORMAL LOW (ref 39.0–52.0)
Hemoglobin: 7 g/dL — ABNORMAL LOW (ref 13.0–17.0)
MCH: 28.5 pg (ref 26.0–34.0)
MCHC: 27.8 g/dL — ABNORMAL LOW (ref 30.0–36.0)
MCV: 102.4 fL — ABNORMAL HIGH (ref 80.0–100.0)
Platelets: 77 10*3/uL — ABNORMAL LOW (ref 150–400)
RBC: 2.46 MIL/uL — ABNORMAL LOW (ref 4.22–5.81)
RDW: 18.2 % — ABNORMAL HIGH (ref 11.5–15.5)
WBC: 5.8 10*3/uL (ref 4.0–10.5)
nRBC: 0 % (ref 0.0–0.2)

## 2022-12-18 LAB — COMPREHENSIVE METABOLIC PANEL
ALT: 17 U/L (ref 0–44)
AST: 26 U/L (ref 15–41)
Albumin: 3.1 g/dL — ABNORMAL LOW (ref 3.5–5.0)
Alkaline Phosphatase: 64 U/L (ref 38–126)
Anion gap: 12 (ref 5–15)
BUN: 50 mg/dL — ABNORMAL HIGH (ref 8–23)
CO2: 26 mmol/L (ref 22–32)
Calcium: 8.6 mg/dL — ABNORMAL LOW (ref 8.9–10.3)
Chloride: 106 mmol/L (ref 98–111)
Creatinine, Ser: 1.85 mg/dL — ABNORMAL HIGH (ref 0.61–1.24)
GFR, Estimated: 36 mL/min — ABNORMAL LOW (ref >60)
Glucose, Bld: 96 mg/dL (ref 70–99)
Potassium: 4.5 mmol/L (ref 3.5–5.1)
Sodium: 144 mmol/L (ref 135–145)
Total Bilirubin: 0.8 mg/dL (ref 0.3–1.2)
Total Protein: 5.8 g/dL — ABNORMAL LOW (ref 6.5–8.1)

## 2022-12-18 LAB — BRAIN NATRIURETIC PEPTIDE: B Natriuretic Peptide: 1420.9 pg/mL — ABNORMAL HIGH (ref 0.0–100.0)

## 2022-12-18 MED ORDER — FUROSEMIDE 20 MG PO TABS: 20.0000 mg | ORAL_TABLET | Freq: Every day | ORAL | Status: DC

## 2022-12-18 NOTE — Progress Notes (Signed)
Occupational Therapy Session Note  Patient Details  Name: TYNAN HESLEY MRN: [id number] Date of Birth: January 18, 1941  {CHL IP REHAB OT TIME CALCULATIONS:[id number]}   Short Term Goals: Week 2:  OT Short Term Goal 1 (Week 2): STG = LTG due to ELOS  Skilled Therapeutic Interventions/Progress Updates:    Patient agreeable to participate in OT session. Reports *** pain level.   Patient participated in skilled OT session focusing on ***. Therapist facilitated/assessed/developed/educated/integrated/elicited *** in order to improve/facilitate/promote    Therapy Documentation Precautions:  Precautions Precautions: Fall, Other (comment) Precaution Comments: awaiting bilateral 2nd toe amputations, sacral wound, hx of Afib, hx of bil rotator cuff tears Required Braces or Orthoses: Other Brace Other Brace: prevalon boots for skin protection Restrictions Weight Bearing Restrictions: No  Therapy/Group: Individual Therapy  Limmie Patricia, OTR/L,CBIS  Supplemental OT - [facility] and [facility] Secure Chat Preferred   12/18/2022, 6:23 PM

## 2022-12-18 NOTE — Progress Notes (Signed)
Physical Therapy Session Note  Patient Details  Name: [name] [name] MRN: [id number] Date of Birth: [birth year]  Today's Date: 12/18/2022 PT Individual Time: [id number] and [id number] PT Individual Time Calculation (min): 55 min and 25 min  Short Term Goals: Week 1:  PT Short Term Goal 1 (Week 1): Pt will perform supine<>sit with min assist PT Short Term Goal 1 - Progress (Week 1): Progressing toward goal PT Short Term Goal 2 (Week 1): Pt will consistently perform sit<>stands using LRAD with min assist PT Short Term Goal 2 - Progress (Week 1): Progressing toward goal PT Short Term Goal 3 (Week 1): Pt will perform bed<>chair transfers using LRAD with min assist PT Short Term Goal 3 - Progress (Week 1): Met PT Short Term Goal 4 (Week 1): Pt will ambulate at least 31ft using LRAD with min assist of 1 and +2 for line management as needed PT Short Term Goal 4 - Progress (Week 1): Met PT Short Term Goal 5 (Week 1): Pt will navigate 4 steps using HRs with mod assist of 1 and +2 for safety if needed PT Short Term Goal 5 - Progress (Week 1): Progressing toward goal Week 2:  PT Short Term Goal 1 (Week 2): STG=LTG due to LOS  Skilled Therapeutic Interventions/Progress Updates:  Treatment Session 1 Received pt ambulating out of bathroom with NT - PT took over with care and MD arrived for morning rounds. Pt agreeable to PT treatment and denied any pain during session. Pt on 2L with SPO2 97% - titrated to 1L throughout session. Session with emphasis on functional mobility/transfers, generalized strengthening and endurance, gait training, and stair navigation. Pt transferred recliner<>WC stand<>pivot with RW and CGA (mod A to stand) - SPO2 dropped to 85% but pt able to recover to 97-98% with pursed lip breathing.   Pt transported to dayroom in [facility] dependently for time management purposes. Pt reports having 4 STE with R handrail. Stood from [facility] with mod A and navigated 3 6in steps with R handrail and mod A  using a lateral stepping technique - pt and wife report son-in-law will help pt getting in/out of house - SPO2 97% after stair navigation. In dayroom, pt stood from [facility] with RW and min A and ambulated 141ft with RW and CGA back to room with total A to manage O2 (cues to widen BOS) - SPO2 dropped to 80% but unsure of accuracy, then suddenly increased to 90%, further increasing to 97% with pursed lip breathing. Pt then performed seated hip abduction with red TB 2x12 and hip flexion with red TB 2x10 bilaterally with emphasis on LE strength/ROM while discussing equipment for D/C - pt/wife report WC will not fit in home and would be too heavy for wife to manage, therefore opting not to receive one. Concluded session with pt sitting in recliner on [place] cushion with all needs within reach and wife present at bedside. Pt left on 1L O2 and RN notified.   Treatment Session 2 Received pt standing for RN to change sacral dressing. Pt agreeable to PT treatment and denied any pain during session. Pt on 1L with SPO2 97-100% - titrated to 0.5L and SPO2 ranging from 90-100% (sats improved significantly with pursed lip breathing). Pt reported urge to have BM and ambulated in/out of bathroom with RW and CGA. Pt required max A for clothing management and continent of bowel. Pt required total A for peri-care but able to stand from commode with supervision. Pt performed the following seated exercises with  emphasis on LE strength/ROM: -knee extension 2x15 bilaterally -hip abduction pillow squeezes 2x10 Concluded session with pt sitting in recliner with all needs within reach and wife present at bedside. Pt left on 0.5L with SPO2 >96% and RN made aware.   Therapy Documentation Precautions:  Precautions Precautions: Fall, Other (comment) Precaution Comments: awaiting bilateral 2nd toe amputations, sacral wound, hx of Afib, hx of bil rotator cuff tears Required Braces or Orthoses: Other Brace Other Brace: prevalon boots for skin  protection Restrictions Weight Bearing Restrictions: No  Therapy/Group: Individual Therapy [name] [name] PT, DPT 12/18/2022, 6:59 AM

## 2022-12-18 NOTE — Progress Notes (Signed)
Occupational Therapy Weekly Progress Note  Patient Details  Name: [name] [name] MRN: [id number] Date of Birth: June 17, 1941  Beginning of progress report period: December 09, 2022 End of progress report period: December 18, 2022   Patient has met 4 of 4 short term goals. Pt is making gradual progress towards LTGs. He is able to bathe at an overall min A level, dress with min A for UB, CGA for LB using modified strategies and requires mod assist for toileting tasks. Pt continues to demonstrate significant deconditioning with cardiorespiratory and vascular endurance requiring increased time between all functional tasks and mobility. He's been able to titrate to 0.5 L supplemental O2 however fluctuates and has been unable to wean completely off. He also has limited bilateral shoulder ROM from old/new RC injuries, general coordination, standing balance and awareness deficits resulting in difficulty completing BADL tasks without increased physical assist. Pt will benefit from continued skilled OT services to focus on mentioned deficits. Wife has been present for all sessions and was assisting him PTA however needs to participate in hands on training prior to DC.   Patient continues to demonstrate the following deficits: muscle weakness, decreased cardiorespiratoy endurance and decreased oxygen support, decreased coordination, and decreased standing balance and therefore will continue to benefit from skilled OT intervention to enhance overall performance with BADL and Reduce care partner burden.  Patient progressing toward long term goals..  Continue plan of care.  OT Short Term Goals Week 1:  OT Short Term Goal 1 (Week 1): Pt will complete sit<>stand during self-care task with mod assist. OT Short Term Goal 1 - Progress (Week 1): Met OT Short Term Goal 2 (Week 1): To promote participation in ADLs, pt will engage in functional activity in standing for 2+ min without rest break. OT Short Term Goal 2 - Progress  (Week 1): Met OT Short Term Goal 3 (Week 1): Pt will complete toilet transfer with mod assist. OT Short Term Goal 3 - Progress (Week 1): Met OT Short Term Goal 4 (Week 1): Pt will complete UB dressing with min assist. OT Short Term Goal 4 - Progress (Week 1): Met Week 2:  OT Short Term Goal 1 (Week 2): STG = LTG due to [facility], MS, OTR/L  12/18/2022, 12:31 PM

## 2022-12-18 NOTE — Progress Notes (Addendum)
Physical Therapy Weekly Progress Note  Patient Details  Name: [name] [name] MRN: [id number] Date of Birth: 1940/10/26  Beginning of progress report period: December 09, 2022 End of progress report period: December 18, 2022  Patient has met 2 of 5 short term goals. Pt demonstrates gradual progress towards long term goals. Pt currently requires mod A for sit<>supine for BLE management, but is able to transfer supine<>sit with supervision. Pt is able to perform sit<>stands with as little as min A but requires up to mod A from low surfaces and when fatigued. Pt is able to perform stand<>pivot transfers with RW and CGA, ambulate up to 168ft with RW and CGA, and navigate 3 6in steps with R handrail and mod A. Pt continues to be limited by fatigue, generalized weakness/deconditioning, and fluctuations with need for O2 - pt currently down to 0.5L with mobility with order to keep SPO2 >96%. Pt will benefit from family education prior to D/C.   Patient continues to demonstrate the following deficits muscle weakness, decreased cardiorespiratoy endurance and decreased oxygen support, and decreased standing balance and decreased balance strategies and therefore will continue to benefit from skilled PT intervention to increase functional independence with mobility.  Patient progressing toward long term goals..  Continue plan of care.  PT Short Term Goals Week 1:  PT Short Term Goal 1 (Week 1): Pt will perform supine<>sit with min assist PT Short Term Goal 1 - Progress (Week 1): Progressing toward goal PT Short Term Goal 2 (Week 1): Pt will consistently perform sit<>stands using LRAD with min assist PT Short Term Goal 2 - Progress (Week 1): Progressing toward goal PT Short Term Goal 3 (Week 1): Pt will perform bed<>chair transfers using LRAD with min assist PT Short Term Goal 3 - Progress (Week 1): Met PT Short Term Goal 4 (Week 1): Pt will ambulate at least 28ft using LRAD with min assist of 1 and +2 for line  management as needed PT Short Term Goal 4 - Progress (Week 1): Met PT Short Term Goal 5 (Week 1): Pt will navigate 4 steps using HRs with mod assist of 1 and +2 for safety if needed PT Short Term Goal 5 - Progress (Week 1): Progressing toward goal Week 2:  PT Short Term Goal 1 (Week 2): STG=LTG due to LOS  Skilled Therapeutic Interventions/Progress Updates:  Ambulation/gait training;Community reintegration;DME/adaptive equipment instruction;Neuromuscular re-education;Psychosocial support;Stair training;UE/LE Strength taining/ROM;Wheelchair propulsion/positioning;Balance/vestibular training;Discharge planning;Functional electrical stimulation;Pain management;Skin care/wound management;Therapeutic Activities;UE/LE Coordination activities;Cognitive remediation/compensation;Disease management/prevention;Functional mobility training;Patient/family education;Splinting/orthotics;Therapeutic Exercise;Visual/perceptual remediation/compensation   Therapy Documentation Precautions:  Precautions Precautions: Fall, Other (comment) Precaution Comments: awaiting bilateral 2nd toe amputations, sacral wound, hx of Afib, hx of bil rotator cuff tears Required Braces or Orthoses: Other Brace Other Brace: prevalon boots for skin protection Restrictions Weight Bearing Restrictions: No  Therapy/Group: Individual Therapy [name] [name] PT, DPT 12/18/2022, 6:57 AM

## 2022-12-18 NOTE — Progress Notes (Signed)
PROGRESS NOTE   Subjective/Complaints: Patient is eager to go home soon, wife asks if he is still on track for Saturday Wife notes that cardiologist came to see patient on Saturday at 10pm  No specific complaints.  ROS:    Pt denies SOB, abd pain, CP, N/V/C/D, and vision changes, no more confusion   Except for HPI Objective:   No results found. Recent Labs    12/18/22 0618  WBC 5.8  HGB 7.0*  HCT 25.2*  PLT 77*    Recent Labs    12/17/22 0614 12/18/22 0618  NA 145 144  K 4.7 4.5  CL 107 106  CO2 30 26  GLUCOSE 106* 96  BUN 42* 50*  CREATININE 1.74* 1.85*  CALCIUM 8.7* 8.6*    Intake/Output Summary (Last 24 hours) at 12/18/2022 1226 Last data filed at 12/18/2022 0853 Gross per 24 hour  Intake 837 ml  Output 875 ml  Net -38 ml     Pressure Injury 11/29/22 Heel Right Stage 3 -  Full thickness tissue loss. Subcutaneous fat may be visible but bone, tendon or muscle are NOT exposed. (Active)  11/29/22 1046  Location: Heel  Location Orientation: Right  Staging: Stage 3 -  Full thickness tissue loss. Subcutaneous fat may be visible but bone, tendon or muscle are NOT exposed.  Wound Description (Comments):   Present on Admission: Yes     Pressure Injury 12/08/22 Sacrum Deep Tissue Pressure Injury - Purple or maroon localized area of discolored intact skin or blood-filled blister due to damage of underlying soft tissue from pressure and/or shear. large area purple area extending to the co (Active)  12/08/22 1910  Location: Sacrum  Location Orientation:   Staging: Deep Tissue Pressure Injury - Purple or maroon localized area of discolored intact skin or blood-filled blister due to damage of underlying soft tissue from pressure and/or shear.  Wound Description (Comments): large area purple area extending to the coccyx & bilateral upper buttocks, with open areas. Charted by WOC as DTI in evolution on 6/18  Present  on Admission: Yes    Physical Exam: Vital Signs Blood pressure (!) 102/59, pulse 62, temperature 97.9 F (36.6 C), temperature source Oral, resp. rate 18, height 5\' 10"  (1.778 m), weight 116 kg, SpO2 98 %. General: awake, alert, sitting up in bed;  NAD, BMI 36.69 HENT: conjugate gaze; oropharynx moist CV: regular rate; no JVD Pulmonary: - decreased at bases- wearing 2L O2 by [place]- not in nose GI: soft, NT, ND, (+)BS Psychiatric: calm Neurological: alert, but not sure why here Skin: Multiple wounds + Sacral PI, Stage 4 + R heel PI, unstageable + R 2nd toe extruded bone; no apparent drainage + L second toe wound with clean base, bloody drainage + B/l shin wounds, healing                                 MSK:      No apparent deformity. BL UE shoulder abduction limited to <90 degrees d/t bilateral rotator cuff tears.      Strength:  RUE: 3/5 SA, 3/5 EF, 3/5 EE, 4/5 WE, 4/5 FF, 4/5 FA                 LUE: 3/5 SA, 3/5 EF, 3/5 EE, 4/5 WE, 4/5 FF, 4/5 FA                 RLE: 2/5 HF, 4/5 KE, 4/5 DF, 4/5 PF                 LLE:  2/5 HF, 4/5 KE, 4/5 DF, 4/5 PF    Neurologic exam:  Cognition: AAO to person, place, time and event.  Language: Fluent, No substitutions or neoglisms. +Hypophonic, breathy vocalizations Memory: Recalls 2/3 objects at 5 minutes.  Insight: Poor insight into current condition.  Mood: Flat affect, appropriate mood.  Sensation: To light touch reduced in bilateral LEs Reflexes: 2+ in BL UE and Les  CN: 2-12 grossly intact.  Coordination: R>L UE intention tremors Spasticity: MAS 0 in all extremities.    Assessment/Plan: 1. Functional deficits which require 3+ hours per day of interdisciplinary therapy in a comprehensive inpatient rehab setting. Physiatrist is providing close team supervision and 24 hour management of active medical problems listed below. Physiatrist and rehab team continue to assess barriers to discharge/monitor  patient progress toward functional and medical goals  Care Tool:  Bathing    Body parts bathed by patient: Abdomen, Face, Chest   Body parts bathed by helper: Front perineal area, Buttocks, Right upper leg, Left upper leg, Right lower leg, Left lower leg, Right arm, Left arm     Bathing assist Assist Level: Maximal Assistance - Patient 24 - 49%     Upper Body Dressing/Undressing Upper body dressing   What is the patient wearing?: Pull over shirt    Upper body assist Assist Level: Maximal Assistance - Patient 25 - 49%    Lower Body Dressing/Undressing Lower body dressing      What is the patient wearing?: Pants     Lower body assist Assist for lower body dressing: Total Assistance - Patient < 25%     Toileting Toileting Toileting Activity did not occur Press photographer and hygiene only): N/A (no void or bm)  Toileting assist Assist for toileting: Minimal Assistance - Patient > 75%     Transfers Chair/bed transfer  Transfers assist     Chair/bed transfer assist level: Contact Guard/Touching assist Chair/bed transfer assistive device: Geologist, engineering   Ambulation assist      Assist level: Contact Guard/Touching assist Assistive device: Walker-rolling Max distance: 14ft   Walk 10 feet activity   Assist     Assist level: Contact Guard/Touching assist Assistive device: Walker-rolling   Walk 50 feet activity   Assist Walk 50 feet with 2 turns activity did not occur: Safety/medical concerns  Assist level: Contact Guard/Touching assist Assistive device: Walker-rolling    Walk 150 feet activity   Assist Walk 150 feet activity did not occur: Safety/medical concerns         Walk 10 feet on uneven surface  activity   Assist Walk 10 feet on uneven surfaces activity did not occur: Safety/medical concerns         Wheelchair     Assist Is the patient using a wheelchair?: Yes Type of Wheelchair: Manual     Wheelchair assist level: Dependent - Patient 0%      Wheelchair 50 feet with 2 turns activity    Assist  Assist Level: Dependent - Patient 0%   Wheelchair 150 feet activity     Assist      Assist Level: Dependent - Patient 0%   Blood pressure (!) 102/59, pulse 62, temperature 97.9 F (36.6 C), temperature source Oral, resp. rate 18, height 5\' 10"  (1.778 m), weight 116 kg, SpO2 98 %.  Medical Problem List and Plan: 1. Functional deficits secondary to debility from loculated pleural effusion and MRSA pneumonia             -patient may shower             -ELOS/Goals: 7 to 10 days, PT/OT SPV   Con't CIR 2.  Antithrombotics: -DVT/anticoagulation:  Pharmaceutical: Eliquis             -antiplatelet therapy: Aspirin    3. Pain Management: Tylenol, tramadol as needed   6/29- denies pain except feet B/L- meds help  4. Mood/Behavior/Sleep: LCSW to evaluate and provide emotional support             -antipsychotic agents: n/a   5. Neuropsych/cognition: This patient is not capable of making decisions on his own behalf.   6. Multiple wounds             -local care to sacrum, bilateral toes (Dr. Lajoyce Corners)  -discussed that Dr. Lajoyce Corners does not recommend amputation due to poor vascular status             -continue pressure relief/Prevalon boots/mattress             - Added multivitamin with minerals and ascorbic acid 500 mg daily for wound healing on admission    7. Fluids/Electrolytes/Nutrition/Malnutrition: Routine Is and Os and follow-up chemistries             - Poor POS, no appetite; Ensure TID, consider Marinol    8: Hypotension: monitor TID and prn             -continue midodrine    6/30- BP soft, but no hypotension per nursing/not dizzy 9: Hyperlipidemia: continue statin   10: Urinary retention: continue flomax since still having urgency   11: combined chronic systolic/diastolic heart failure: daily weight             -continue Pacerone 200 mg daily              -on no diuretics   6/24- weight bumped up dramatically today- from 115 to 119- will recheck in AM before giving diuretics- will verify, since off due to CKD/AKI  6/29- Weight was 119 kg yesterday but 116 kg today- more his baseline per review of weights in last 8 days.   6/30- weight down more due to Lasix- down to 113 kg- however Cr up to 1.7 and BUN up to 42- will recheck CMP in AM and BNP and allow team to decide plan 12: chronic atrial fibrillation: rate controlled             -on Eliquis, Pacerone   6/24- pt told me this AM- never had Afib/irregular heart rate- did research to find he does have it- and pt poor historian 13: Pneumonia with effusion/MRSA sepsis:             -continue Zyvox for 4 weeks             -O2 via [place] at 2 L             -BiPAP as needed/consult respiratory therapy  6/24- will get PICC line  out since on oral ABX. Breathing "good" per pt- denies SOB   14: s/p AVR, stable thoracic aortic aneurysm: follow-up outpatient   15: AKI atop CKD IIIb: (baseline 1.2)             -slowing improving, encourage adequate hydration   Daily BMP  -Cr reviewed and worsening, likely will improve with transition of lasix from IV to oral, decrease lasix to 20mg  daily  16: Chronic anemia/iron deficiency: f/u CBC on Mondays. Iron supplement started   Hgb reviewed and improving  17: Hard of hearing: wife to bring in hearing aids  89. Suboptimal vitamin D: continue ergocalciferol 50,000U one per week for 7 weeks  19. Loose stool: florastor added  20. Constipation: miralax d/ced, reviewed chart- last BM 6/24  6/29- LBM this AM  21. Morbid Obesity- BMI 35.74- encourage weight loss, encouraged high protein diet  6/29- BMI up to 36.69  22. UTI: start keflex, discussed 7 day course with wife  41. Confusion: likely 2/2 UTI, improved with Keflex, discussed that patient will complete 7 day course, discussed with wife that confusion has not recurred  24. Pleural effusion: transition lasix  to oral, decrease lasix to 20mg  daily  25. Hypotension: transition lasix to oral, critical care note reviewed, decrease lasix to 20mg  daily   LOS: 10 days A FACE TO FACE EVALUATION WAS PERFORMED  [name] [name] 12/18/2022, 12:26 PM

## 2022-12-18 NOTE — Progress Notes (Signed)
Occupational Therapy Session Note  Patient Details  Name: [name] [name] MRN: [id number] Date of Birth: 03/16/1941  Today's Date: 12/18/2022 OT Individual Time: 1120-1200 & 1435-1530 OT Individual Time Calculation (min): 40 min & 55 min   Short Term Goals: Week 1:  OT Short Term Goal 1 (Week 1): Pt will complete sit<>stand during self-care task with mod assist. OT Short Term Goal 2 (Week 1): To promote participation in ADLs, pt will engage in functional activity in standing for 2+ min without rest break. OT Short Term Goal 3 (Week 1): Pt will complete toilet transfer with mod assist. OT Short Term Goal 4 (Week 1): Pt will complete UB dressing with min assist.  Skilled Therapeutic Interventions/Progress Updates:  Session 1 Skilled OT intervention completed with focus on ambulatory endurance, dynamic standing balance, and toileting needs. Pt received seated in recliner, agreeable to session. No pain reported.  Pt declined self-care needs. Heavy min A sit > stand using RW with cues for anterior weight shifting. Ambulated 104 ft using RW with close supervision > gym with w/c follow for fatigue however did not use.   Seated in w/c, OT applied clips to different regions of LB body clothing. [place] added to front frame of RW for improved anterior weight shifting then stood with light min A, removed all clips with alternating hands and placed on theraband on RW to simulate toileting steps and reaching needs. No difficulty retrieving clips. Extended rest break, then pt reported urge for BM. Transported dependently in w/c back to room to [facility] over toilet.   CGA sit > stand and stand pivot using grab bar, CGA to doff LB clothing. Continent of BM (only a smear in brief) and void. Cues needed to initiate brief changing as pt verbalized that it wasn't needed however smear present. Total A to thread brief, then supervision sit > stand using grab bar, min A to complete posterior pericare to prevent stool in  sacral wound, then CGA to donn clothing over hips with cues needed for sequencing. Wife retrieved RW and placed in front of pt, then pt ambulated with CGA to recliner with assist mainly for O2 tank management.   Pt remained seated in recliner with BLE elevated, with wife present, and with all needs in reach at end of session.  Vitals -Received on 1L via [place], SPO2 99% -WNL after activity and remained on 1L at end of session  Session 2 Skilled OT intervention completed with focus on ambulatory and cardiovascular/respiratory endurance. Pt received seated in recliner, agreeable to session. No pain reported.  OT demonstrated to wife on the cues to provide pt for prepping to stand including rocking forward with forehead towards yellow band on RW, counting to 3 and loading feet to power up. Demonstrated cueing method for 2 trials at CGA sit > stand level! Then wife assisted with direction needed for wife to cue correctly, then pt stood with her assist with light min A using RW.   Ambulated 75 ft with close supervision using RW to nustep in gym. Completed 3 min x 4 on nustep level 4 for cardiovascular/respiratory endurance needed for BADLs. Prolonged rest break needed in between intervals for fatigue, however O2 sat WNLs. Intermittent assist needed to secure feet on foot plates of nustep.  CGA sit > stand using RW then supervision ambulation 15 ft to w/c. Extended rest break provided then pt ambulated ~50 ft back to room using RW with supervision.  Pt remained seated in recliner, with BLE elevated,  wife present, and with all needs in reach at end of session.  Vitals -Received on 0.5L via [place], SPO2 98% -Attempted to titrate to RA, however desat to 90% -97% after activity on 0.5L -Remained on 0.5 L at end of session    Therapy Documentation Precautions:  Precautions Precautions: Fall, Other (comment) Precaution Comments: awaiting bilateral 2nd toe amputations, sacral wound, hx of Afib, hx of bil  rotator cuff tears Required Braces or Orthoses: Other Brace Other Brace: prevalon boots for skin protection Restrictions Weight Bearing Restrictions: No    Therapy/Group: Individual Therapy  Melvyn Novas, MS, OTR/L  12/18/2022, 3:35 PM

## 2022-12-19 ENCOUNTER — Inpatient Hospital Stay: Payer: Medicare Other | Admitting: Infectious Diseases

## 2022-12-19 LAB — BASIC METABOLIC PANEL
Anion gap: 12 (ref 5–15)
BUN: 52 mg/dL — ABNORMAL HIGH (ref 8–23)
CO2: 26 mmol/L (ref 22–32)
Calcium: 9 mg/dL (ref 8.9–10.3)
Chloride: 106 mmol/L (ref 98–111)
Creatinine, Ser: 1.9 mg/dL — ABNORMAL HIGH (ref 0.61–1.24)
GFR, Estimated: 35 mL/min — ABNORMAL LOW (ref >60)
Glucose, Bld: 116 mg/dL — ABNORMAL HIGH (ref 70–99)
Potassium: 4.9 mmol/L (ref 3.5–5.1)
Sodium: 144 mmol/L (ref 135–145)

## 2022-12-19 LAB — CBC
HCT: 25.1 % — ABNORMAL LOW (ref 39.0–52.0)
Hemoglobin: 7.4 g/dL — ABNORMAL LOW (ref 13.0–17.0)
MCH: 29.8 pg (ref 26.0–34.0)
MCHC: 29.5 g/dL — ABNORMAL LOW (ref 30.0–36.0)
MCV: 101.2 fL — ABNORMAL HIGH (ref 80.0–100.0)
Platelets: 80 10*3/uL — ABNORMAL LOW (ref 150–400)
RBC: 2.48 MIL/uL — ABNORMAL LOW (ref 4.22–5.81)
RDW: 18.3 % — ABNORMAL HIGH (ref 11.5–15.5)
WBC: 5.9 10*3/uL (ref 4.0–10.5)
nRBC: 0 % (ref 0.0–0.2)

## 2022-12-19 MED ORDER — DOXYCYCLINE HYCLATE 100 MG PO TABS: 100.0000 mg | ORAL_TABLET | Freq: Two times a day (BID) | ORAL | Status: DC

## 2022-12-19 MED ORDER — FUROSEMIDE 40 MG PO TABS
40.0000 mg | ORAL_TABLET | Freq: Every day | ORAL | Status: DC
  Administered 2022-12-19: 40 mg via ORAL
  Filled 2022-12-19 (×2): qty 1

## 2022-12-19 NOTE — Progress Notes (Signed)
PROGRESS NOTE   Subjective/Complaints: No new complaints this morning Eager to leave Discussed low Hgb, improved today Cr getting slightly worse  No specific complaints.  ROS:  Pt denies SOB, abd pain, CP, N/V/C/D, and vision changes, no more confusion   Except for HPI Objective:   No results found. Recent Labs    12/18/22 0618 12/19/22 0942  WBC 5.8 5.9  HGB 7.0* 7.4*  HCT 25.2* 25.1*  PLT 77* 80*    Recent Labs    12/18/22 0618 12/19/22 0942  NA 144 144  K 4.5 4.9  CL 106 106  CO2 26 26  GLUCOSE 96 116*  BUN 50* 52*  CREATININE 1.85* 1.90*  CALCIUM 8.6* 9.0    Intake/Output Summary (Last 24 hours) at 12/19/2022 1049 Last data filed at 12/19/2022 0823 Gross per 24 hour  Intake 358 ml  Output 675 ml  Net -317 ml     Pressure Injury 11/29/22 Heel Right Stage 3 -  Full thickness tissue loss. Subcutaneous fat may be visible but bone, tendon or muscle are NOT exposed. (Active)  11/29/22 1046  Location: Heel  Location Orientation: Right  Staging: Stage 3 -  Full thickness tissue loss. Subcutaneous fat may be visible but bone, tendon or muscle are NOT exposed.  Wound Description (Comments):   Present on Admission: Yes     Pressure Injury 12/08/22 Sacrum Deep Tissue Pressure Injury - Purple or maroon localized area of discolored intact skin or blood-filled blister due to damage of underlying soft tissue from pressure and/or shear. large area purple area extending to the co (Active)  12/08/22 1910  Location: Sacrum  Location Orientation:   Staging: Deep Tissue Pressure Injury - Purple or maroon localized area of discolored intact skin or blood-filled blister due to damage of underlying soft tissue from pressure and/or shear.  Wound Description (Comments): large area purple area extending to the coccyx & bilateral upper buttocks, with open areas. Charted by WOC as DTI in evolution on 6/18  Present on  Admission: Yes    Physical Exam: Vital Signs Blood pressure (!) 111/57, pulse 87, temperature 98.2 F (36.8 C), resp. rate 16, height 5\' 10"  (1.778 m), weight 117 kg, SpO2 100 %. General: awake, alert, sitting up in bed;  NAD, BMI 37.01 HENT: conjugate gaze; oropharynx moist CV: regular rate; no JVD Pulmonary: - decreased at bases- wearing 2L O2 by - not in nose GI: soft, NT, ND, (+)BS Psychiatric: calm Neurological: alert, but not sure why here Skin: Multiple wounds + Sacral PI, Stage 4 + R heel PI, unstageable + R 2nd toe extruded bone; no apparent drainage + L second toe wound with clean base, bloody drainage + B/l shin wounds, healing                                 MSK:      No apparent deformity. BL UE shoulder abduction limited to <90 degrees d/t bilateral rotator cuff tears.      Strength:                RUE: 3/5 SA, 3/5 EF,  3/5 EE, 4/5 WE, 4/5 FF, 4/5 FA                 LUE: 3/5 SA, 3/5 EF, 3/5 EE, 4/5 WE, 4/5 FF, 4/5 FA                 RLE: 2/5 HF, 4/5 KE, 4/5 DF, 4/5 PF                 LLE:  2/5 HF, 4/5 KE, 4/5 DF, 4/5 PF    Neurologic exam:  Cognition: AAO to person, place, time and event.  Language: Fluent, No substitutions or neoglisms. +Hypophonic, breathy vocalizations Memory: Recalls 2/3 objects at 5 minutes.  Insight: Poor insight into current condition.  Mood: Flat affect, appropriate mood.  Sensation: To light touch reduced in bilateral LEs Reflexes: 2+ in BL UE and Les  CN: 2-12 grossly intact.  Coordination: R>L UE intention tremors Spasticity: MAS 0 in all extremities.    Assessment/Plan: 1. Functional deficits which require 3+ hours per day of interdisciplinary therapy in a comprehensive inpatient rehab setting. Physiatrist is providing close team supervision and 24 hour management of active medical problems listed below. Physiatrist and rehab team continue to assess barriers to discharge/monitor patient progress toward  functional and medical goals  Care Tool:  Bathing    Body parts bathed by patient: Abdomen, Face, Chest   Body parts bathed by helper: Front perineal area, Buttocks, Right upper leg, Left upper leg, Right lower leg, Left lower leg, Right arm, Left arm     Bathing assist Assist Level: Maximal Assistance - Patient 24 - 49%     Upper Body Dressing/Undressing Upper body dressing   What is the patient wearing?: Pull over shirt    Upper body assist Assist Level: Maximal Assistance - Patient 25 - 49%    Lower Body Dressing/Undressing Lower body dressing      What is the patient wearing?: Pants     Lower body assist Assist for lower body dressing: Total Assistance - Patient < 25%     Toileting Toileting Toileting Activity did not occur Press photographer and hygiene only): N/A (no void or bm)  Toileting assist Assist for toileting: Dependent - Patient 0%     Transfers Chair/bed transfer  Transfers assist     Chair/bed transfer assist level: Contact Guard/Touching assist Chair/bed transfer assistive device: Geologist, engineering   Ambulation assist      Assist level: Contact Guard/Touching assist Assistive device: Walker-rolling Max distance: 155ft   Walk 10 feet activity   Assist     Assist level: Contact Guard/Touching assist Assistive device: Walker-rolling   Walk 50 feet activity   Assist Walk 50 feet with 2 turns activity did not occur: Safety/medical concerns  Assist level: Contact Guard/Touching assist Assistive device: Walker-rolling    Walk 150 feet activity   Assist Walk 150 feet activity did not occur: Safety/medical concerns         Walk 10 feet on uneven surface  activity   Assist Walk 10 feet on uneven surfaces activity did not occur: Safety/medical concerns         Wheelchair     Assist Is the patient using a wheelchair?: Yes Type of Wheelchair: Manual    Wheelchair assist level: Dependent -  Patient 0%      Wheelchair 50 feet with 2 turns activity    Assist        Assist Level: Dependent - Patient 0%  Wheelchair 150 feet activity     Assist      Assist Level: Dependent - Patient 0%   Blood pressure (!) 111/57, pulse 87, temperature 98.2 F (36.8 C), resp. rate 16, height 5\' 10"  (1.778 m), weight 117 kg, SpO2 100 %.  Medical Problem List and Plan: 1. Functional deficits secondary to debility from loculated pleural effusion and MRSA pneumonia             -patient may shower             -ELOS/Goals: 7 to 10 days, PT/OT SPV   Continue CIR  2.  Atrial fibrillation: continue Eliquis             -antiplatelet therapy: Aspirin    3. B//l foot pain: continue tylenol  4. Multiple wounds             -local care to sacrum, bilateral toes (Dr. Lajoyce Corners)  -discussed that Dr. Lajoyce Corners does not recommend amputation due to poor vascular status             -continue pressure relief/Prevalon boots/mattress             - Added multivitamin with minerals and ascorbic acid 500 mg daily for wound healing on admission    7. Fluids/Electrolytes/Nutrition/Malnutrition: Routine Is and Os and follow-up chemistries             - Poor POS, no appetite; Ensure TID, consider Marinol    8: Hypotension: monitor TID and prn             -continue midodrine    6/30- BP soft, but no hypotension per nursing/not dizzy 9: Hyperlipidemia: continue statin   10: Urinary retention: continue flomax since still having urgency   11: combined chronic systolic/diastolic heart failure: daily weight             -continue Pacerone 200 mg daily             -on no diuretics   6/24- weight bumped up dramatically today- from 115 to 119- will recheck in AM before giving diuretics- will verify, since off due to CKD/AKI  6/29- Weight was 119 kg yesterday but 116 kg today- more his baseline per review of weights in last 8 days.   6/30- weight down more due to Lasix- down to 113 kg- however Cr up to 1.7 and BUN  up to 42- will recheck CMP in AM and BNP and allow team to decide plan 12: chronic atrial fibrillation: rate controlled             -on Eliquis, Pacerone   6/24- pt told me this AM- never had Afib/irregular heart rate- did research to find he does have it- and pt poor historian 13: Pneumonia with effusion/MRSA sepsis:             -continue Zyvox for 4 weeks             -O2 via [place] at 2 L             -BiPAP as needed/consult respiratory therapy  6/24- will get PICC line out since on oral ABX. Breathing "good" per pt- denies SOB   14: s/p AVR, stable thoracic aortic aneurysm: follow-up outpatient   15: AKI atop CKD IIIb: (baseline 1.2)             -slowing improving, encourage adequate hydration   Daily BMP  -Cr reviewed and worsening, likely will improve with transition of lasix  from IV to oral, decrease lasix to 20mg  daily  16: Chronic anemia/iron deficiency: f/u CBC on Mondays. Iron supplement started   Hgb reviewed and improving  17: Hard of hearing: wife to bring in hearing aids  107. Suboptimal vitamin D: continue ergocalciferol 50,000U one per week for 7 weeks  19. Loose stool: florastor added  20. Constipation: miralax d/ced, reviewed chart- last BM 6/24  6/29- LBM this AM  21. 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 UTI: start keflex, discussed 7 day course with wife  92. Confusion: likely 2/2 UTI, improved with Keflex, discussed that patient will complete 7 day course, discussed with wife that confusion has not recurred  24. Pleural effusion: transition lasix to oral, increase lasix to 40mg  daily  25. Hypotension: transition lasix to oral, critical care note reviewed, increased lasix back to 40mg   >50 minutes spent in reviewing blood pressure, discussing low Hgb with wife and patient, repeating lab today and noting improvement, discussing at what levels he typically receives  transfusions  LOS: 11 days A FACE TO FACE EVALUATION WAS PERFORMED  Clint Bolder P Stevan Eberwein 12/19/2022, 10:49 AM

## 2022-12-19 NOTE — Progress Notes (Signed)
Physical Therapy Session Note  Patient Details  Name: [name] [name] MRN: [id number] Date of Birth: 06-03-1941  Today's Date: 12/19/2022 PT Individual Time: 4098-1191 PT Individual Time Calculation (min): 53 min  Today's Date: 12/19/2022 PT Missed Time: 22 Minutes Missed Time Reason: Nursing care  Short Term Goals: Week 1:  PT Short Term Goal 1 (Week 1): Pt will perform supine<>sit with min assist PT Short Term Goal 1 - Progress (Week 1): Progressing toward goal PT Short Term Goal 2 (Week 1): Pt will consistently perform sit<>stands using LRAD with min assist PT Short Term Goal 2 - Progress (Week 1): Progressing toward goal PT Short Term Goal 3 (Week 1): Pt will perform bed<>chair transfers using LRAD with min assist PT Short Term Goal 3 - Progress (Week 1): Met PT Short Term Goal 4 (Week 1): Pt will ambulate at least 19ft using LRAD with min assist of 1 and +2 for line management as needed PT Short Term Goal 4 - Progress (Week 1): Met PT Short Term Goal 5 (Week 1): Pt will navigate 4 steps using HRs with mod assist of 1 and +2 for safety if needed PT Short Term Goal 5 - Progress (Week 1): Progressing toward goal Week 2:  PT Short Term Goal 1 (Week 2): STG=LTG due to LOS  Skilled Therapeutic Interventions/Progress Updates:   Received pt sitting in recliner with wife present at bedside and RN changing dressings, then laboratory arrived to draw blood. Allowed time and upon returning pt agreeable to PT treatment and reported pain in feet from dressing change. Pt on 2L with SPO2 100% - titrated to 1L, then to 0.5L and SPO2 91-99% (improved with cues for pursed lip breathing). Session with emphasis on functional mobility/transfers, generalized strengthening and endurance, dynamic standing balance/coordination, and gait training. Pt stood from recliner with RW and light mod A and transferred into WC via stand<>pivot with min A due to poster LOB when backing up to WC. Pt reported feeling "horrible"  upon reaching WC and feeling cold - provided pt with blanket and checked BP: 125/58 and symptoms improved with increased time.   Pt transported to/from room in [facility] dependently for energy conservation purposes. Car simulator out of service and pt politely declined practicing stairs today but agreed to ambulation. Stood from [facility] with RW and min A x 2 trials and ambulated 44ft x 1 and 3ft x 1 with RW and CGA fading to close supervision with total A for equipment management - cues to widen BOS and for upright posture/gaze. Pt required rest breaks due to fatigue but SPO2 remained >96% on 0.5L. Returned to room and transferred WC<>recliner with RW via stand<>pivot with CGA/close supervision with min A to stand from [facility]. Concluded session with pt sitting in recliner with all needs within reach and wife present at bedside. 22 minutes missed of skilled physical therapy due to nursing care and laboratory.   Therapy Documentation Precautions:  Precautions Precautions: Fall, Other (comment) Precaution Comments: awaiting bilateral 2nd toe amputations, sacral wound, hx of Afib, hx of bil rotator cuff tears Required Braces or Orthoses: Other Brace Other Brace: prevalon boots for skin protection Restrictions Weight Bearing Restrictions: No  Therapy/Group: Individual Therapy [name] [name] PT, DPT 12/19/2022, 7:02 AM

## 2022-12-19 NOTE — Progress Notes (Signed)
Occupational Therapy Session Note  Patient Details  Name: APOLONIO WHITON MRN: [id number] Date of Birth: Nov 17, 1940  Today's Date: 12/19/2022 OT Individual Time: 1335-1430 OT Individual Time Calculation (min): 55 min    Short Term Goals: Week 2:  OT Short Term Goal 1 (Week 2): STG = LTG due to ELOS  Skilled Therapeutic Interventions/Progress Updates:  Skilled OT intervention completed with focus on family education with wife regarding toilet transfers, ambulatory endurance, dynamic standing balance, O2 weaning. Pt received seated in recliner, agreeable to session. No pain reported.  Pt declined toileting needs as his wife reported she assisted pt to the bathroom due to urgency. OT encouraged wife/pt to demo the ambulatory transfer in order for clearance on safety sheet. Wife, [name], assisted pt with min A sit > stand using RW, then pt ambulated with CGA using RW to commode. Cues provided to [name] for taking the O2 tank with her and managing behind her instead of trying to push it in front and pt getting entangled. Similar exit back to recliner; nurse notified of clearance for toilet and in room ambulatory transfers.  Seated rest break, then pt ambulated about 75 ft with supervision to gym with w/c follow for fatigue however did not need.   Pt then participated in dynamic balance activity with min A needed for sit > stands x3 using RW, then supervision for balance with RW to place/remove squigz (1 trial each hand) on long mirror. Pt was able to complete half of this on RA and then remained on RA for rest of mobility.  Ambulated 95 ft with supervision using RW back to room with wife taking over for ambulatory transfer to commode for void. Wife assisted with all toileting steps with urinary void only. Ambulated with wife's assist to recliner.  Pt remained seated in recliner, with wife present all needs in reach at end of session.  Vitals -Received on 0.5 L via [place], SPO2 99% -96% after titration  to RA only -Desat to 94% with activity on RA but able to rebound with pursed lip breathing -Remained on 0.25 for recovery at end of session     Therapy Documentation Precautions:  Precautions Precautions: Fall, Other (comment) Precaution Comments: awaiting bilateral 2nd toe amputations, sacral wound, hx of Afib, hx of bil rotator cuff tears Required Braces or Orthoses: Other Brace Other Brace: prevalon boots for skin protection Restrictions Weight Bearing Restrictions: No    Therapy/Group: Individual Therapy  Melvyn Novas, MS, OTR/L  12/19/2022, 2:59 PM

## 2022-12-20 LAB — CBC WITH DIFFERENTIAL/PLATELET
Abs Immature Granulocytes: 0.03 10*3/uL (ref 0.00–0.07)
Basophils Absolute: 0 10*3/uL (ref 0.0–0.1)
Basophils Relative: 0 %
Eosinophils Absolute: 0.2 10*3/uL (ref 0.0–0.5)
Eosinophils Relative: 4 %
HCT: 22.9 % — ABNORMAL LOW (ref 39.0–52.0)
Hemoglobin: 6.7 g/dL — CL (ref 13.0–17.0)
Immature Granulocytes: 1 %
Lymphocytes Relative: 10 %
Lymphs Abs: 0.4 10*3/uL — ABNORMAL LOW (ref 0.7–4.0)
MCH: 28.8 pg (ref 26.0–34.0)
MCHC: 29.3 g/dL — ABNORMAL LOW (ref 30.0–36.0)
MCV: 98.3 fL (ref 80.0–100.0)
Monocytes Absolute: 0.2 10*3/uL (ref 0.1–1.0)
Monocytes Relative: 5 %
Neutro Abs: 3.5 10*3/uL (ref 1.7–7.7)
Neutrophils Relative %: 80 %
Platelets: 73 10*3/uL — ABNORMAL LOW (ref 150–400)
RBC: 2.33 MIL/uL — ABNORMAL LOW (ref 4.22–5.81)
RDW: 18.3 % — ABNORMAL HIGH (ref 11.5–15.5)
WBC: 4.4 10*3/uL (ref 4.0–10.5)
nRBC: 0 % (ref 0.0–0.2)

## 2022-12-20 LAB — PREPARE RBC (CROSSMATCH)

## 2022-12-20 LAB — BPAM RBC
Blood Product Expiration Date: 202407152359
ISSUE DATE / TIME: 202407031412
Unit Type and Rh: 6200

## 2022-12-20 LAB — BASIC METABOLIC PANEL
Anion gap: 14 (ref 5–15)
BUN: 53 mg/dL — ABNORMAL HIGH (ref 8–23)
CO2: 28 mmol/L (ref 22–32)
Calcium: 8.9 mg/dL (ref 8.9–10.3)
Chloride: 104 mmol/L (ref 98–111)
Creatinine, Ser: 1.87 mg/dL — ABNORMAL HIGH (ref 0.61–1.24)
GFR, Estimated: 35 mL/min — ABNORMAL LOW (ref >60)
Glucose, Bld: 94 mg/dL (ref 70–99)
Potassium: 4.3 mmol/L (ref 3.5–5.1)
Sodium: 146 mmol/L — ABNORMAL HIGH (ref 135–145)

## 2022-12-20 LAB — TYPE AND SCREEN: Antibody Screen: NEGATIVE

## 2022-12-20 LAB — OCCULT BLOOD X 1 CARD TO LAB, STOOL: Fecal Occult Bld: POSITIVE — AB

## 2022-12-20 MED ORDER — DOXYCYCLINE HYCLATE 100 MG PO TABS
100.0000 mg | ORAL_TABLET | Freq: Two times a day (BID) | ORAL | Status: DC
  Administered 2022-12-20 – 2022-12-23 (×6): 100 mg via ORAL
  Filled 2022-12-20 (×7): qty 1

## 2022-12-20 MED ORDER — SODIUM CHLORIDE 0.9% IV SOLUTION: Freq: Once | INTRAVENOUS | Status: AC

## 2022-12-20 MED ORDER — FUROSEMIDE 10 MG/ML IJ SOLN
40.0000 mg | Freq: Once | INTRAMUSCULAR | Status: AC
  Administered 2022-12-20: 40 mg via INTRAVENOUS
  Filled 2022-12-20: qty 4

## 2022-12-20 NOTE — Progress Notes (Signed)
Lab notified nurse that pt's Hgb is 6.7. value yesterday was 7.4. Deatra Ina and Jacalyn Lefevre was notified. PA placed orders for 1 unit of RBC.

## 2022-12-20 NOTE — Progress Notes (Signed)
Occupational Therapy Session Note  Patient Details  Name: [name] [name] MRN: [id number] Date of Birth: Nov 06, 1940  Today's Date: 12/20/2022 OT Individual Time: [phone] OT Individual Time Calculation (min): 70 min    Short Term Goals: Week 2:  OT Short Term Goal 1 (Week 2): STG = LTG due to ELOS  Skilled Therapeutic Interventions/Progress Updates:  Skilled OT intervention completed with focus on ambulatory and cardiovascular/respiratory endurance. Pt received with nursing assisting with sacral dressing, agreeable to session. No pain reported. Received on 0.5 L supplemental O2 via [place].  Had pt switch out his own [place] to portable tank with min cues needed and education on why managing this independently is important at home. Required several trials to power up into standing with cues needed for anterior weight shifting with heavy min A using RW. Pt then ambulated about 75 ft using RW with supervision to nustep.   Completed 3 min x 4 on nustep level 6 for cardiovascular/respiratory endurance needed for BADLs. Extended rest breaks needed in between intervals for fatigue with, with O2 desat to 92% on 0.5L but able to recover to > 95% per order. Intermittent assist needed to secure feet on foot plates of nustep.   CGA sit > stand from nustep using RW then ambulated with supervision to w/c about 15 ft away. Extended rest provided with noted greater fatigue due to low hemoglobin level (per MD notes).   Min A sit > stand using RW, then ambulated back to room about 75 ft with supervision using RW. Pt remained seated in recliner, with wife present and with all needs in reach at end of session.    Therapy Documentation Precautions:  Precautions Precautions: Fall, Other (comment) Precaution Comments: awaiting bilateral 2nd toe amputations, sacral wound, hx of Afib, hx of bil rotator cuff tears Required Braces or Orthoses: Other Brace Other Brace: prevalon boots for skin  protection Restrictions Weight Bearing Restrictions: No    Therapy/Group: Individual Therapy  Melvyn Novas, MS, OTR/L  12/20/2022, 11:34 AM

## 2022-12-20 NOTE — Discharge Instructions (Addendum)
Inpatient Rehab Discharge Instructions  [name] [name] Discharge date and time:  12/23/22  Activities/Precautions/ Functional Status: Activity: no lifting, driving, or strenuous exercise  till cleared by MD Diet: cardiac diet. Need to drink at least 6 glasses of water daily.   Wound Care:  Cleanse with soap and water. Paint area on legs with betadine. Cover with dry dressing. Change daily.  2.   Wash buttocks with soap and water. Cover open area with Xeroform and dry dressing. Change daily.    Functional status:  ___ No restrictions     ___ Walk up steps independently _X__ 24/7 supervision/assistance   ___ Walk up steps with assistance ___ Intermittent supervision/assistance  ___ Bathe/dress independently ___ Walk with walker     ___ Bathe/dress with assistance ___ Walk Independently    ___ Shower independently _X__ Walk with assistance    _X__ Shower with assistance _X__ No alcohol     ___ Return to work/school ________    COMMUNITY REFERRALS UPON DISCHARGE:    Home Health:   PT     OT                  Agency: [facility] Phone: [phone]   Medical Equipment/Items Ordered: Home 02 Set Up, Wheelchair, Bedside Commode and Roho Cushion                                                  Agency/Supplier: [employer] [phone]  Special Instructions: Use flutter valve at 10 reps at least 4 times a day.    My questions have been answered and I understand these instructions. I will adhere to these goals and the provided educational materials after my discharge from the hospital.  Patient/Caregiver Signature _______________________________ Date __________  Clinician Signature _______________________________________ Date __________  Please bring this form and your medication list with you to all your follow-up doctor's appointments.

## 2022-12-20 NOTE — Progress Notes (Addendum)
Patient ID: [name] [name], male   DOB: 1940/09/12, 82 y.o.   MRN: [id number]  Team Conference Report to Patient/Family  Team Conference discussion was reviewed with the patient and caregiver, including goals, any changes in plan of care and target discharge date.  Patient and caregiver express understanding and are in agreement.  The patient has a target discharge date of 12/23/22.  SW met with patient and spouse at bedside and provided team conference updates. Patient informed that Sw will order 02 for d/c. Spouse requesting HH referral be switched to [facility]. No additional questions or concerns.    Home 02 Concentrator and Bedside Commode ordered through [employer].  Andria Rhein 12/20/2022, 1:20 PM

## 2022-12-20 NOTE — Progress Notes (Signed)
PROGRESS NOTE   Subjective/Complaints: Hgb dropped to 6.7, 1U PRBC ordered with IV lasix today, will order repeat CBC for tomorrow with stool occult. Discussed with wife and anemia has been longstanding issue  No specific complaints.  ROS:  Pt denies SOB, abd pain, CP, N/V/C/D, and vision changes, no more confusion   Except for HPI Objective:   No results found. Recent Labs    12/19/22 0942 12/20/22 0535  WBC 5.9 4.4  HGB 7.4* 6.7*  HCT 25.1* 22.9*  PLT 80* 73*    Recent Labs    12/19/22 0942 12/20/22 0535  NA 144 146*  K 4.9 4.3  CL 106 104  CO2 26 28  GLUCOSE 116* 94  BUN 52* 53*  CREATININE 1.90* 1.87*  CALCIUM 9.0 8.9    Intake/Output Summary (Last 24 hours) at 12/20/2022 1037 Last data filed at 12/20/2022 0908 Gross per 24 hour  Intake 297 ml  Output 300 ml  Net -3 ml     Pressure Injury 11/29/22 Heel Right Stage 3 -  Full thickness tissue loss. Subcutaneous fat may be visible but bone, tendon or muscle are NOT exposed. (Active)  11/29/22 1046  Location: Heel  Location Orientation: Right  Staging: Stage 3 -  Full thickness tissue loss. Subcutaneous fat may be visible but bone, tendon or muscle are NOT exposed.  Wound Description (Comments):   Present on Admission: Yes     Pressure Injury 12/08/22 Sacrum Deep Tissue Pressure Injury - Purple or maroon localized area of discolored intact skin or blood-filled blister due to damage of underlying soft tissue from pressure and/or shear. large area purple area extending to the co (Active)  12/08/22 1910  Location: Sacrum  Location Orientation:   Staging: Deep Tissue Pressure Injury - Purple or maroon localized area of discolored intact skin or blood-filled blister due to damage of underlying soft tissue from pressure and/or shear.  Wound Description (Comments): large area purple area extending to the coccyx & bilateral upper buttocks, with open areas.  Charted by WOC as DTI in evolution on 6/18  Present on Admission: Yes    Physical Exam: Vital Signs Blood pressure (!) 105/55, pulse 98, temperature 98.1 F (36.7 C), resp. rate 16, height 5\' 10"  (1.778 m), weight 116.4 kg, SpO2 96 %. General: awake, alert, sitting up in bed;  NAD, BMI 37.01 HENT: conjugate gaze; oropharynx moist CV: regular rate; no JVD, hypotensive Pulmonary: - decreased at bases- wearing 2L O2 by [place]- not in nose GI: soft, NT, ND, (+)BS Psychiatric: calm Neurological: alert, but not sure why here Skin: Multiple wounds + Sacral PI, Stage 4 + R heel PI, unstageable + R 2nd toe extruded bone; no apparent drainage + L second toe wound with clean base, bloody drainage + B/l shin wounds, healing                                 MSK:      No apparent deformity. BL UE shoulder abduction limited to <90 degrees d/t bilateral rotator cuff tears.      Strength:  RUE: 3/5 SA, 3/5 EF, 3/5 EE, 4/5 WE, 4/5 FF, 4/5 FA                 LUE: 3/5 SA, 3/5 EF, 3/5 EE, 4/5 WE, 4/5 FF, 4/5 FA                 RLE: 2/5 HF, 4/5 KE, 4/5 DF, 4/5 PF                 LLE:  2/5 HF, 4/5 KE, 4/5 DF, 4/5 PF    Neurologic exam:  Cognition: AAO to person, place, time and event.  Language: Fluent, No substitutions or neoglisms. +Hypophonic, breathy vocalizations Memory: Recalls 2/3 objects at 5 minutes.  Insight: Poor insight into current condition.  Mood: Flat affect, appropriate mood.  Sensation: To light touch reduced in bilateral LEs Reflexes: 2+ in BL UE and Les  CN: 2-12 grossly intact.  Coordination: R>L UE intention tremors Spasticity: MAS 0 in all extremities.    Assessment/Plan: 1. Functional deficits which require 3+ hours per day of interdisciplinary therapy in a comprehensive inpatient rehab setting. Physiatrist is providing close team supervision and 24 hour management of active medical problems listed below. Physiatrist and rehab team continue  to assess barriers to discharge/monitor patient progress toward functional and medical goals  Care Tool:  Bathing    Body parts bathed by patient: Abdomen, Face, Chest   Body parts bathed by helper: Front perineal area, Buttocks, Right upper leg, Left upper leg, Right lower leg, Left lower leg, Right arm, Left arm     Bathing assist Assist Level: Maximal Assistance - Patient 24 - 49%     Upper Body Dressing/Undressing Upper body dressing   What is the patient wearing?: Pull over shirt    Upper body assist Assist Level: Maximal Assistance - Patient 25 - 49%    Lower Body Dressing/Undressing Lower body dressing      What is the patient wearing?: Pants     Lower body assist Assist for lower body dressing: Total Assistance - Patient < 25%     Toileting Toileting Toileting Activity did not occur Press photographer and hygiene only): N/A (no void or bm)  Toileting assist Assist for toileting: Dependent - Patient 0%     Transfers Chair/bed transfer  Transfers assist     Chair/bed transfer assist level: Contact Guard/Touching assist Chair/bed transfer assistive device: Geologist, engineering   Ambulation assist      Assist level: Contact Guard/Touching assist Assistive device: Walker-rolling Max distance: 143ft   Walk 10 feet activity   Assist     Assist level: Contact Guard/Touching assist Assistive device: Walker-rolling   Walk 50 feet activity   Assist Walk 50 feet with 2 turns activity did not occur: Safety/medical concerns  Assist level: Contact Guard/Touching assist Assistive device: Walker-rolling    Walk 150 feet activity   Assist Walk 150 feet activity did not occur: Safety/medical concerns         Walk 10 feet on uneven surface  activity   Assist Walk 10 feet on uneven surfaces activity did not occur: Safety/medical concerns         Wheelchair     Assist Is the patient using a wheelchair?: Yes Type of  Wheelchair: Manual    Wheelchair assist level: Dependent - Patient 0%      Wheelchair 50 feet with 2 turns activity    Assist        Assist Level:  Dependent - Patient 0%   Wheelchair 150 feet activity     Assist      Assist Level: Dependent - Patient 0%   Blood pressure (!) 105/55, pulse 98, temperature 98.1 F (36.7 C), resp. rate 16, height 5\' 10"  (1.778 m), weight 116.4 kg, SpO2 96 %.  Medical Problem List and Plan: 1. Functional deficits secondary to debility from loculated pleural effusion and MRSA pneumonia             -patient may shower             -ELOS/Goals: 7 to 10 days, PT/OT SPV   Continue CIR  2.  Atrial fibrillation: continue Eliquis             -antiplatelet therapy: Aspirin    3. B//l foot pain: continue tylenol  4. Multiple wounds             -local care to sacrum, bilateral toes (Dr. Lajoyce Corners)  -discussed that Dr. Lajoyce Corners does not recommend amputation due to poor vascular status             -continue pressure relief/Prevalon boots/mattress             - Added multivitamin with minerals and ascorbic acid 500 mg daily for wound healing on admission    7. Fluids/Electrolytes/Nutrition/Malnutrition: Routine Is and Os and follow-up chemistries             - Poor POS, no appetite; Ensure TID, consider Marinol    8: Hypotension: monitor TID and prn             -continue midodrine    6/30- BP soft, but no hypotension per nursing/not dizzy 9: Hyperlipidemia: continue statin   10: Urinary retention: continue flomax since still having urgency   11: combined chronic systolic/diastolic heart failure: daily weight             -continue Pacerone 200 mg daily             -on no diuretics   6/24- weight bumped up dramatically today- from 115 to 119- will recheck in AM before giving diuretics- will verify, since off due to CKD/AKI  6/29- Weight was 119 kg yesterday but 116 kg today- more his baseline per review of weights in last 8 days.   6/30- weight down  more due to Lasix- down to 113 kg- however Cr up to 1.7 and BUN up to 42- will recheck CMP in AM and BNP and allow team to decide plan 12: chronic atrial fibrillation: rate controlled             -on Eliquis, Pacerone   6/24- pt told me this AM- never had Afib/irregular heart rate- did research to find he does have it- and pt poor historian 13: Pneumonia with effusion/MRSA sepsis:             -continue Zyvox for 4 weeks             -O2 via [place] at 2 L             -BiPAP as needed/consult respiratory therapy  6/24- will get PICC line out since on oral ABX. Breathing "good" per pt- denies SOB   14: s/p AVR, stable thoracic aortic aneurysm: follow-up outpatient   15: AKI atop CKD IIIb: (baseline 1.2)             -slowing improving, encourage adequate hydration   Daily BMP  -Cr reviewed and worsening, likely  will improve with transition of lasix from IV to oral, continue lasix 40mg  daily.   16: Chronic anemia/iron deficiency: f/u CBC on Mondays. Iron supplement started   Hgb reviewed and decreased to 6.7 on 7.3, 1 U PRBC ordered  17: Hard of hearing: wife to bring in hearing aids  22. Suboptimal vitamin D: continue ergocalciferol 50,000U one per week for 7 weeks  19. Loose stool: florastor added  20. Constipation: miralax d/ced, reviewed chart- last BM 6/24  6/29- LBM this AM  21. 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 UTI: start keflex, discussed 7 day course with wife  76. Confusion: likely 2/2 UTI, improved with Keflex, discussed that patient will complete 7 day course, discussed with wife that confusion has not recurred  24. Pleural effusion: transition lasix to oral, increase lasix to 40mg  daily, discussed with wife that this is home dose  25. Hypotension: transition lasix to oral, critical care note reviewed, increased lasix back to 40mg , continue midodrine 10mg  TID    LOS: 12 days A FACE TO FACE  EVALUATION WAS PERFORMED  Drema Pry Magdalene Tardiff 12/20/2022, 10:37 AM

## 2022-12-20 NOTE — Progress Notes (Signed)
Physical Therapy Session Note  Patient Details  Name: [name] [name] MRN: [id number] Date of Birth: [birth year]  Today's Date: 12/20/2022 PT Individual Time: 1033-1117 PT Individual Time Calculation (min): 44 min   Short Term Goals: Week 1:  PT Short Term Goal 1 (Week 1): Pt will perform supine<>sit with min assist PT Short Term Goal 1 - Progress (Week 1): Progressing toward goal PT Short Term Goal 2 (Week 1): Pt will consistently perform sit<>stands using LRAD with min assist PT Short Term Goal 2 - Progress (Week 1): Progressing toward goal PT Short Term Goal 3 (Week 1): Pt will perform bed<>chair transfers using LRAD with min assist PT Short Term Goal 3 - Progress (Week 1): Met PT Short Term Goal 4 (Week 1): Pt will ambulate at least 9ft using LRAD with min assist of 1 and +2 for line management as needed PT Short Term Goal 4 - Progress (Week 1): Met PT Short Term Goal 5 (Week 1): Pt will navigate 4 steps using HRs with mod assist of 1 and +2 for safety if needed PT Short Term Goal 5 - Progress (Week 1): Progressing toward goal Week 2:  PT Short Term Goal 1 (Week 2): STG=LTG due to LOS   Skilled Therapeutic Interventions/Progress Updates:  Patient seated upright with BLE elevated in recliner on entrance to room. Patient alert and agreeable to PT session. RN present and prepping pt's morning meds. Wife present and relates pt's recent medical hx as well as CLOF and MD order for blood transfusion today d/t pt's drop in hemoglobin.   Patient with no pain complaint at start of session.  Therapeutic Activity: Transfers: Pt performed sit<>stand transfers initially with ModA for power up and improving to MinA after blocked practice during session with focus on forward hip hinge to improve weight shift. Pt self chooses split UE positioning most likely d/t rotator cuff tears. VC/ tc for bringing chest forward. Stand pivot transfers throughout session with MinA/ CGA.   Gait Training:  Pt  ambulated 110' x2 using RW with CGA. Demonstrated forward flexed posture with slow, but consistent pace. Reduced knee/ hip flexion for low step height and foot flat for initial contact most like likely d/t wounds and need for 2nd toe amputation. Provided vc/ tc for level gaze.   [name] SpO2 throughout session reads 100%. Following ambulation, pt's resp rate increases to 32 breaths/ min and requires time to reduce. By , decreases to 21 breaths/ min.   Patient seated in recliner with BLE elevated and pillow propped at end of session with brakes locked, no alarm set as wife in room providing supervision, and all needs within reach.   Therapy Documentation Precautions:  Precautions Precautions: Fall, Other (comment) Precaution Comments: awaiting bilateral 2nd toe amputations, sacral wound, hx of Afib, hx of bil rotator cuff tears Required Braces or Orthoses: Other Brace Other Brace: prevalon boots for skin protection Restrictions Weight Bearing Restrictions: No General:   Vital Signs: Therapy Vitals Pulse Rate: 98 BP: (!) 105/55 Patient Position (if appropriate): Sitting Oxygen Therapy SpO2: 96 % O2 Device: Nasal Cannula O2 Flow Rate (L/min): 0.5 L/min Pain: No pain complaint from pt this session.   Therapy/Group: Individual Therapy  Loel Dubonnet PT, DPT, CSRS 12/20/2022, 12:28 PM

## 2022-12-20 NOTE — Progress Notes (Signed)
Physical Therapy Session Note  Patient Details  Name: [name] [name] MRN: [id number] Date of Birth: 07-22-1940  Today's Date: 12/20/2022 PT Individual Time: 1301-1339 PT Individual Time Calculation (min): 38 min   Short Term Goals: Week 1:  PT Short Term Goal 1 (Week 1): Pt will perform supine<>sit with min assist PT Short Term Goal 1 - Progress (Week 1): Progressing toward goal PT Short Term Goal 2 (Week 1): Pt will consistently perform sit<>stands using LRAD with min assist PT Short Term Goal 2 - Progress (Week 1): Progressing toward goal PT Short Term Goal 3 (Week 1): Pt will perform bed<>chair transfers using LRAD with min assist PT Short Term Goal 3 - Progress (Week 1): Met PT Short Term Goal 4 (Week 1): Pt will ambulate at least 5ft using LRAD with min assist of 1 and +2 for line management as needed PT Short Term Goal 4 - Progress (Week 1): Met PT Short Term Goal 5 (Week 1): Pt will navigate 4 steps using HRs with mod assist of 1 and +2 for safety if needed PT Short Term Goal 5 - Progress (Week 1): Progressing toward goal Week 2:  PT Short Term Goal 1 (Week 2): STG=LTG due to LOS  Skilled Therapeutic Interventions/Progress Updates:  Received pt sitting in recliner with RN present at bedside administering medications - plan for blood transfusion after session (hemoglobin 6.7). Pt agreeable to PT treatment and denied any pain during session. Pt on 0.5L with SPO2 98-100% - titrated to RA and SPO2 >96% throughout session. Session with emphasis on functional mobility/transfers, generalized strengthening and endurance, and gait training. Pt stood from recliner with RW and mod A and transferred into WC via stand<>pivot with RW and CGA. Pt transported to/from room in [facility] dependently for time management purposes. Stood from [facility] with RW and heavy min A and ambulated 166ft with RW and CGA - cues to widen BOS. Pt required multiple extended rest breaks throughout session due to fatigue. Returned to  room and transferred WC<>recliner stand<>pivot with RW and CGA (min A to stand). Concluded session with pt sitting in recliner on [place] cushion with all needs within reach and wife present at bedside. Pt left on RA with SPO2 100% - RN made aware.   Therapy Documentation Precautions:  Precautions Precautions: Fall, Other (comment) Precaution Comments: awaiting bilateral 2nd toe amputations, sacral wound, hx of Afib, hx of bil rotator cuff tears Required Braces or Orthoses: Other Brace Other Brace: prevalon boots for skin protection Restrictions Weight Bearing Restrictions: No  Therapy/Group: Individual Therapy [name] [name] PT, DPT 12/20/2022, 7:14 AM

## 2022-12-20 NOTE — Patient Care Conference (Signed)
Inpatient RehabilitationTeam Conference and Plan of Care Update Date: 12/20/2022   Time: 11:51 AM    Patient Name: [name] [name]      Medical Record Number: [id number]  Date of Birth: 05/05/1941 Sex: Male         Room/Bed: 4W14C/4W14C-01 Payor Info: Payor: MEDICARE / Plan: MEDICARE PART A AND B / Product Type: *No Product type* /    Admit Date/Time:  12/08/2022  6:13 PM  Primary Diagnosis:  Pneumonia  Hospital Problems: Principal Problem:   Pneumonia Active Problems:   Debility    Expected Discharge Date: Expected Discharge Date: 12/23/22  Team Members Present: Physician leading conference: Dr. Sula Soda Social Worker Present: Lavera Guise, BSW Nurse Present: Chana Bode, RN PT Present: Raechel Chute, PT OT Present: Candee Furbish, OT SLP Present: Other (comment) Fae Pippin, SLP) PPS Coordinator present : Fae Pippin, SLP     Current Status/Progress Goal Weekly Team Focus  Bowel/Bladder      Continent of bowel and bladder          Swallow/Nutrition/ Hydration               ADL's   Set up A UB bathing, Min A UB dressing, LB bathing/dressing and toileting   min A ADLs, supervision mobility   Barrier to DC- requiring new oxygen; plan to have pt manage his own [place] cord and wife manage O2 tank with mobility in prep for DC if he needs it at home    Mobility   supine<>sit supervision using bed features, sit<>supine mod A, sit<>stands with min A, stand<>pivot with CGA, gait 137ft with RW CGA, 3 6in steps with R handrail mod A   CGA, min A steps  barriers: global weakness, deconditioning, proper foot protection, O2 management    Communication                Safety/Cognition/ Behavioral Observations               Pain      N/a          Skin      Multi wounds   Wife able to manage skin care   Follow up with wife and patient on wound care and skin care     Discharge Planning:  Discharging home with spouse. HH established with  [facility].   Team Discussion: Patient with low Hgb; PRBCs ordered per MD. Changing abx to doxycycline at discharge.   Patient on target to meet rehab goals: yes, currently min assist for transfers and mod assist sit - stand and managing steps.  Limited by deconditioning and weakness.  Needs set up for upper body care and min assist for upper body dressing and lower body care.  Goals for discharge set for CGA overall and min assist for steps.  *See Care Plan and progress notes for long and short-term goals.   Revisions to Treatment Plan:  O2 weaning unsuccessful; home O2 recommended Guaiac stools   Teaching Needs: Safety, medications, skin care,   Current Barriers to Discharge: Decreased caregiver support, Home enviroment access/layout, and New oxygen  Possible Resolutions to Barriers: Family education HH follow up services DME: roho cushion, [facility]     Medical Summary Current Status: morbid obesity, pleural effusion, UTI, anemia  Barriers to Discharge: Morbid Obesity;Medical stability  Barriers to Discharge Comments: morbid obesity, pleural effusion, UTI, anemia Possible Resolutions to [employer] Focus: provide dietary education, continue lasix and tirate based on weights/blood pressure/creatinine, 1 U PRBCs ordered with  IV lasix today, discussed transition to doxycycline   Continued Need for Acute Rehabilitation Level of Care: The patient requires daily medical management by a physician with specialized training in physical medicine and rehabilitation for the following reasons: Direction of a multidisciplinary physical rehabilitation program to maximize functional independence : Yes Medical management of patient stability for increased activity during participation in an intensive rehabilitation regime.: Yes Analysis of laboratory values and/or radiology reports with any subsequent need for medication adjustment and/or medical intervention. : Yes   I attest that I was present,  lead the team conference, and concur with the assessment and plan of the team.   Chana Bode B 12/20/2022, 3:39 PM

## 2022-12-20 NOTE — Progress Notes (Signed)
ID Pharmacy Note   Was alerted that patient is now having profound anemia and decreased platelet counts by rehab pharmacist. Noted that patient has been on linezolid for 3 weeks which may be causing some myelosuppression.   Discussed patient with Dr. Luciana Axe, ID. Ok with ID to switch linezolid to doxycycline today instead of 7/10. Orders updated.    Sharin Mons, PharmD, BCPS, BCIDP Infectious Diseases Clinical Pharmacist Phone: [phone] 12/20/2022 11:10 AM

## 2022-12-20 NOTE — Progress Notes (Signed)
Physical Therapy Session Note  Patient Details  Name: [name] [name] MRN: [id number] Date of Birth: July 24, 1940  Today's Date: 12/20/2022 PT Missed Time: 45 Minutes Missed Time Reason: Patient fatigue;Nursing care (Nursing initiating ordered blood transfusion)  Short Term Goals: Week 1:  PT Short Term Goal 1 (Week 1): Pt will perform supine<>sit with min assist PT Short Term Goal 1 - Progress (Week 1): Progressing toward goal PT Short Term Goal 2 (Week 1): Pt will consistently perform sit<>stands using LRAD with min assist PT Short Term Goal 2 - Progress (Week 1): Progressing toward goal PT Short Term Goal 3 (Week 1): Pt will perform bed<>chair transfers using LRAD with min assist PT Short Term Goal 3 - Progress (Week 1): Met PT Short Term Goal 4 (Week 1): Pt will ambulate at least 15ft using LRAD with min assist of 1 and +2 for line management as needed PT Short Term Goal 4 - Progress (Week 1): Met PT Short Term Goal 5 (Week 1): Pt will navigate 4 steps using HRs with mod assist of 1 and +2 for safety if needed PT Short Term Goal 5 - Progress (Week 1): Progressing toward goal Week 2:  PT Short Term Goal 1 (Week 2): STG=LTG due to LOS  Skilled Therapeutic Interventions/Progress Updates:  Pt resting comfortably in recliner with BLE elevated. Wife in room. RN and charge RN present in room with ordered unit of PRBCs and initiating blood transfusion d/t low Hgb.   Pt missed 45 min of skilled therapy due to ordered blood transfusion. Will re-attempt as schedule and pt availability permits.   Therapy Documentation Precautions:  Precautions Precautions: Fall, Other (comment) Precaution Comments: awaiting bilateral 2nd toe amputations, sacral wound, hx of Afib, hx of bil rotator cuff tears Required Braces or Orthoses: Other Brace Other Brace: prevalon boots for skin protection Restrictions Weight Bearing Restrictions: No General:   Vital Signs:  Pain: No indication of pain from pt  under nursing care.   Therapy/Group: Individual Therapy  Loel Dubonnet 12/20/2022, 12:58 PM

## 2022-12-21 DIAGNOSIS — A4902 Methicillin resistant Staphylococcus aureus infection, unspecified site: Secondary | ICD-10-CM

## 2022-12-21 DIAGNOSIS — R195 Other fecal abnormalities: Secondary | ICD-10-CM

## 2022-12-21 LAB — BASIC METABOLIC PANEL
Anion gap: 14 (ref 5–15)
BUN: 57 mg/dL — ABNORMAL HIGH (ref 8–23)
CO2: 24 mmol/L (ref 22–32)
Calcium: 8.7 mg/dL — ABNORMAL LOW (ref 8.9–10.3)
Chloride: 105 mmol/L (ref 98–111)
Creatinine, Ser: 2.03 mg/dL — ABNORMAL HIGH (ref 0.61–1.24)
GFR, Estimated: 32 mL/min — ABNORMAL LOW (ref >60)
Glucose, Bld: 88 mg/dL (ref 70–99)
Potassium: 4.4 mmol/L (ref 3.5–5.1)
Sodium: 143 mmol/L (ref 135–145)

## 2022-12-21 LAB — CBC WITH DIFFERENTIAL/PLATELET
Abs Immature Granulocytes: 0.03 10*3/uL (ref 0.00–0.07)
Basophils Absolute: 0 10*3/uL (ref 0.0–0.1)
Basophils Relative: 0 %
Eosinophils Absolute: 0.1 10*3/uL (ref 0.0–0.5)
Eosinophils Relative: 3 %
HCT: 25.8 % — ABNORMAL LOW (ref 39.0–52.0)
Hemoglobin: 7.6 g/dL — ABNORMAL LOW (ref 13.0–17.0)
Immature Granulocytes: 1 %
Lymphocytes Relative: 13 %
Lymphs Abs: 0.6 10*3/uL — ABNORMAL LOW (ref 0.7–4.0)
MCH: 28.4 pg (ref 26.0–34.0)
MCHC: 29.5 g/dL — ABNORMAL LOW (ref 30.0–36.0)
MCV: 96.3 fL (ref 80.0–100.0)
Monocytes Absolute: 0.2 10*3/uL (ref 0.1–1.0)
Monocytes Relative: 5 %
Neutro Abs: 3.7 10*3/uL (ref 1.7–7.7)
Neutrophils Relative %: 78 %
Platelets: 71 10*3/uL — ABNORMAL LOW (ref 150–400)
RBC: 2.68 MIL/uL — ABNORMAL LOW (ref 4.22–5.81)
RDW: 19.1 % — ABNORMAL HIGH (ref 11.5–15.5)
WBC: 4.8 10*3/uL (ref 4.0–10.5)
nRBC: 0 % (ref 0.0–0.2)

## 2022-12-21 LAB — TYPE AND SCREEN
ABO/RH(D): A POS
Unit division: 0

## 2022-12-21 LAB — OCCULT BLOOD X 1 CARD TO LAB, STOOL: Fecal Occult Bld: POSITIVE — AB

## 2022-12-21 LAB — BPAM RBC

## 2022-12-21 LAB — FERRITIN: Ferritin: 385 ng/mL — ABNORMAL HIGH (ref 24–336)

## 2022-12-21 MED ORDER — PANTOPRAZOLE SODIUM 40 MG IV SOLR
40.0000 mg | Freq: Two times a day (BID) | INTRAVENOUS | Status: DC
  Administered 2022-12-21 – 2022-12-23 (×4): 40 mg via INTRAVENOUS
  Filled 2022-12-21 (×5): qty 10

## 2022-12-21 MED ORDER — FUROSEMIDE 20 MG PO TABS: 20.0000 mg | ORAL_TABLET | Freq: Every day | ORAL | Status: DC

## 2022-12-21 NOTE — Progress Notes (Signed)
Physical Therapy Session Note  Patient Details  Name: [name] [name] MRN: [id number] Date of Birth: 1941/01/09  Today's Date: 12/21/2022 PT Individual Time: 0905-0950 PT Individual Time Calculation (min): 45 min   Short Term Goals: Week 1:  PT Short Term Goal 1 (Week 1): Pt will perform supine<>sit with min assist PT Short Term Goal 1 - Progress (Week 1): Progressing toward goal PT Short Term Goal 2 (Week 1): Pt will consistently perform sit<>stands using LRAD with min assist PT Short Term Goal 2 - Progress (Week 1): Progressing toward goal PT Short Term Goal 3 (Week 1): Pt will perform bed<>chair transfers using LRAD with min assist PT Short Term Goal 3 - Progress (Week 1): Met PT Short Term Goal 4 (Week 1): Pt will ambulate at least 75ft using LRAD with min assist of 1 and +2 for line management as needed PT Short Term Goal 4 - Progress (Week 1): Met PT Short Term Goal 5 (Week 1): Pt will navigate 4 steps using HRs with mod assist of 1 and +2 for safety if needed PT Short Term Goal 5 - Progress (Week 1): Progressing toward goal Week 2:  PT Short Term Goal 1 (Week 2): STG=LTG due to LOS  Skilled Therapeutic Interventions/Progress Updates: Patient in recliner with family present and RN providing medicaiton on entrance to room. Patient alert and agreeable to PT session.   Patient reported no pain at beginning of session. Pt also in recliner with no nasal canula. Pt provided with VC throughout session to maintain nasal breathing vs mouth breathing to increase O2 saturation. Pt provided with rest breaks throughout session PRN.   Therapeutic Activity: Transfers: Pt performed sit<>stand transfers throughout session with minA. Provided VC for bigger anterior weight shift.  Gait Training:  Pt ambulated 160' around nursing station and day room gym with RW and with CGA provided for safety. Pt initially cued to focus on breathing through nose vs mouth, and to maintian neutral BOS per narrow BOS  presentation. Pt required mod cues to maintain instructions throughout trial. Pt reported feeling "winded" towards end of trial and SpO2 monitored as recorded in "vitals."   Stair Navigation: - Ascending 2 (6") steps. Pt cued to navigate steps with sequence worked with under attending PT. Pt with B UE on R HR to mimick home environment, lateral step to technique. Pt required minA for safety. - Descending 2 (6") steps with lateral step to technique, and minA/CGA for safety. Pt reported being "winded" after stair navigation (SpO2 monitored). Pt reported feeling okay with stair navigation prior to discharge.   Patient in [facility] at end of session with brakes locked, wife assisting pt to bathroom, NT present and all needs within reach.      Therapy Documentation Precautions:  Precautions Precautions: Fall, Other (comment) Precaution Comments: awaiting bilateral 2nd toe amputations, sacral wound, hx of Afib, hx of bil rotator cuff tears Required Braces or Orthoses: Other Brace Other Brace: prevalon boots for skin protection Restrictions Weight Bearing Restrictions: No  Vitals: 95% room air 98% after 1st gait trial (HR 105bpm) 96% after stairs (HR 100bpm)  Therapy/Group: Individual Therapy  Berdine Rasmusson PTA 12/21/2022, 12:19 PM

## 2022-12-21 NOTE — Progress Notes (Signed)
Discussed patient with Dr. Arrie Aran who reviewed chart and feels that patient is over diuresed. He recommends hold lasix, encouraging fluids and monitoring with serial labs for improvement in SCr to 1.5-1.6 range. Lasix held and daily labs ordered. CXR ordered for follow up on effusion tomorrow. If effusion resolved; may not required diuretics and can follow up with cards/PCP after discharge.

## 2022-12-21 NOTE — Progress Notes (Signed)
Physical Therapy Session Note  Patient Details  Name: [name] [name] MRN: [id number] Date of Birth: 1941/03/02  Today's Date: 12/21/2022 PT Individual Time: 7829-5621 PT Individual Time Calculation (min): 71 min   Short Term Goals: Week 1:  PT Short Term Goal 1 (Week 1): Pt will perform supine<>sit with min assist PT Short Term Goal 1 - Progress (Week 1): Progressing toward goal PT Short Term Goal 2 (Week 1): Pt will consistently perform sit<>stands using LRAD with min assist PT Short Term Goal 2 - Progress (Week 1): Progressing toward goal PT Short Term Goal 3 (Week 1): Pt will perform bed<>chair transfers using LRAD with min assist PT Short Term Goal 3 - Progress (Week 1): Met PT Short Term Goal 4 (Week 1): Pt will ambulate at least 47ft using LRAD with min assist of 1 and +2 for line management as needed PT Short Term Goal 4 - Progress (Week 1): Met PT Short Term Goal 5 (Week 1): Pt will navigate 4 steps using HRs with mod assist of 1 and +2 for safety if needed PT Short Term Goal 5 - Progress (Week 1): Progressing toward goal Week 2:  PT Short Term Goal 1 (Week 2): STG=LTG due to LOS  Skilled Therapeutic Interventions/Progress Updates:   Received pt sitting in recliner with wife and daughter present at bedside. Pt agreeable to PT treatment and denied any pain during session. Pt on RA with SPO2 >94%. Session with emphasis on discharge planning, family education training, functional mobility/transfers, generalized strengthening and endurance, and stair navigation.   Pt transferred sit<>stand from recliner with RW and min A provided by wife and transferred stand<>pivot recliner<>WC with RW and close supervision. Pt transported to/from room in [facility] dependently for time management purposes. Pt stood from [facility] with min A and navigated 3 6in steps with R handrail and heavy min/light mod A using a lateral stepping technique to simulate home entry - educated [name] [name] on proper hand placement and body  positioning with stair navigation (although son-in-law will be primary one assisting pt).   Discussed having walker bag and reacher for pt upon discharge and family with questions regarding shoewear - looked on [employer] for appropriate shoe wear and reacher for pt. [name] [name] requesting grounds pass - secure chatted MD and order placed. Pt stood with RW and min A provided by wife and picked up tissue box using reacher with CGA for balance. Returned to room and [name] [name] assisted pt back to [facility] with RW via stand<>pivot with min A to stand and close supervision for transfer. RN arrived to administer medications and went through sensation, MMT, and pain interference questionnaire. Concluded session with pt sitting in recliner on Roho cushion with all needs within reach and family present at bedside.   Therapy Documentation Precautions:  Precautions Precautions: Fall, Other (comment) Precaution Comments: awaiting bilateral 2nd toe amputations, sacral wound, hx of Afib, hx of bil rotator cuff tears Required Braces or Orthoses: Other Brace Other Brace: prevalon boots for skin protection Restrictions Weight Bearing Restrictions: No  Therapy/Group: Individual Therapy [name] [name] PT, DPT 12/21/2022, 6:58 AM

## 2022-12-21 NOTE — Progress Notes (Signed)
Occupational Therapy Session Note  Patient Details  Name: [name] [name] MRN: [id number] Date of Birth: [birth year]  Today's Date: 12/21/2022 OT Individual Time: [phone] OT Individual Time Calculation (min): 82 min    Short Term Goals: Week 2:  OT Short Term Goal 1 (Week 2): STG = LTG due to ELOS  Skilled Therapeutic Interventions/Progress Updates:      Therapy Documentation Precautions:  Precautions Precautions: Fall, Other (comment) Precaution Comments: awaiting bilateral 2nd toe amputations, sacral wound, hx of Afib, hx of bil rotator cuff tears Required Braces or Orthoses: Other Brace Other Brace: prevalon boots for skin protection Restrictions Weight Bearing Restrictions: No General: "Hello" Pt seated in W/C upon OT arrival, agreeable to OT. Wife and daughter present. O2 >90% for entire session. BIMS/CAMs completed in preparation for D/C. Pt taken outside for ~3 min during session, wanted to return inside d/t heat.  Pain:  0/10 pain reported/noted  ADL: Pt CGA for recliner>W/C transfer with RW, VC for anterior lean when going to stand   Exercises: Pt issued UE theraband HEP in order to increase functional strength, endurance and activity tolerance in order to increase independence in ADLs such as bathing. Pt issued yellow and red theraband and discussed direction/technique of exercises, demonstrating verbal understanding. Pt completed 3x10 exercises at W/C level listed below: -elbow extensions -shoulder horizontal abduction, modified d/t rotator cuff injuries  -bicep curls  -shoulder flexion, modified d/t rotator cuff injuries -backward rows Pt required increased time to complete d/t fatigue    Other Treatments:  Pt educated on how heat can affect O2 stats. Pt educated on proper breathing techniques to manage O2 >93%. Pt, family and OT discussed any comments/questions concerns for home going, pt and family report no concerns.    Therapy/Group: Individual  Therapy  Velia Meyer, OTD, OTR/L 12/21/2022, 3:43 PM

## 2022-12-21 NOTE — Consult Note (Signed)
Consultation Note   Referring Provider:  Inpatient Rehab PCP: Street, [name], MD Primary Gastroenterologist: Gentry Fitz        Reason for consultation: Anemia  DOA: 12/08/2022         Hospital Day: 14   Assessment and Plan  Patient is a 82 y.o. 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 Admitted with respiratory failure  82 yo male admitted with MRSA bacteremia and loculated pleural effusion with cavitary pneumonia. We were asked to evaluate for anemia and heme positive stool. He has chronic anemia with recent worsening of hgb  ( mid 8 range >> 6.7) in setting of illness / prolonged hospitalization but also heme positive stool on Eliquis. Low TIBC and normal iron saturation speak against iron deficiency -Hgb improved from 6.7 to 7.6 post 1 unit of blood -obtain ferritin level.  -Long discussion with patient and family. Patient is a high risk for endoscopic procedures and wife understandably has concerns about sedation risk. If he develops overt GI bleeding then will need to revisit the risks and benefits of endoscopic evaluation - For now BID IV PPI and trend hgb.    History of Present Illness  Patient was hospitalized in late June with acute respiratory failure, MRSA bacteremia, and loculated pleural effusion with cavitary pneumonia.  During that admission he received a unit of blood for drifting hgb.  He had a prolonged hospital course and was eventually transferred to CIR on 6/21.   Over the last several days patient's hgb has be fluctuating. In the last couple of days it has trended down to 6.7. Stool is heme+. He was transfused another unit of bloodi yesterday.    Bailee has not seen any blood in his stool. BMs are dark green on iron and this is  unchanged. He has not been having any abdominal pain. No N/V or other focal GI symptoms. He admits to a poor appetite but only because he doesn't like hospital food. He eats when wife brings [employer]. He has no history of PUD. He hasn't had a colonoscopy in > 25 years.    Labs and Imaging: Recent Labs    12/19/22 0942 12/20/22 0535 12/21/22 0620  WBC 5.9 4.4 4.8  HGB 7.4* 6.7* 7.6*  HCT 25.1* 22.9* 25.8*  PLT 80* 73* 71*   Recent Labs    12/19/22 0942 12/20/22 0535 12/21/22 0620  NA 144 146* 143  K 4.9 4.3 4.4  CL 106 104 105  CO2 26 28 24   GLUCOSE 116* 94 88  BUN 52* 53* 57*  CREATININE 1.90* 1.87* 2.03*  CALCIUM 9.0 8.9 8.7*   No results for input(s): "PROT", "ALBUMIN", "AST", "ALT", "ALKPHOS", "BILITOT", "BILIDIR", "IBILI" in the last 72 hours. No results for input(s): "HEPBSAG", "HCVAB", "HEPAIGM", "HEPBIGM" in the last 72 hours. No results for input(s): "LABPROT", "INR" in the last 72 hours.  Principal Problem:   Pneumonia Active Problems:   Debility   Past Medical History:  Diagnosis Date   Acute on chronic diastolic heart failure (HCC) 08/22/2013   Acute on chronic respiratory failure with  hypoxia (HCC) 10/03/2019   Acute on chronic systolic heart failure (HCC) 05/11/2013   EF from 35% to 50-55% on 6/14 ECHO.    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 No evidence of dissection or endoleak.  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 No evidence of abdominal aortic dissection.  Occluded proximal left subclav   Aortic valve insufficiency    Arthritis    "probably in my knees before they  replaced them" (01/28/2015)   Atrial fibrillation (HCC) 05/21/2013   Atrial flutter (HCC)    Bradycardia 08/23/2013   CAD (coronary artery disease)    Chest pain 01/28/2015   CHF (congestive heart failure) (HCC)    Chronic diastolic heart failure (HCC) 01/28/2015   Descending aortic aneurysm (HCC)    DVT (deep venous thrombosis) (HCC)    ?LLE post knee surgery   Dyspnea 09/2019   HCAP (healthcare-associated pneumonia) 10/11/2013   History of blood transfusion    "after one of my knee surgeries"   HTN (hypertension)    Hyperlipidemia    Hypotension 12/17/2019   Incarcerated left inguinal hernia s/p repair 12/02/2019 12/01/2019   Insomnia 09/04/2013   Multiple fractures of ribs, right side, init for clos fx 09/04/2019   Near syncope 01/28/2015   Pressure injury of skin 10/06/2019   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 12/18/2019 12/17/2019   Recurrent UTI 10/06/2013   Shock circulatory (HCC) 10/07/2013   Thoracic aneurysm    Tobacco abuse    Urinary retention 10/06/2013   UTI (urinary tract infection) 10/06/2013    Past Surgical History:  Procedure Laterality Date   AORTIC VALVE REPLACEMENT  02/19/2009   AORTO-FEMORAL BYPASS GRAFT  03/2010   ascending aortic and arch aneurysm repair/notes 04/09/2009   CARDIAC VALVE REPLACEMENT     CAROTID-SUBCLAVIAN BYPASS GRAFT Left 08/26/2013   Procedure: BYPASS GRAFT CAROTID-SUBCLAVIAN;  Surgeon: Nada Libman, MD;  Location: [facility] OR;  Service: Vascular;  Laterality: Left;   CATARACT EXTRACTION W/ INTRAOCULAR LENS  IMPLANT, BILATERAL Bilateral    ENDOVASCULAR STENT INSERTION N/A 08/26/2013   Procedure:  THORACIC STENT GRAFT INSERTION;  Surgeon: Nada Libman, MD;  Location: [facility] OR;  Service: Vascular;  Laterality: N/A;   EYE SURGERY     INGUINAL HERNIA REPAIR Left 12/02/2019   Procedure: REPAIR LEFT INGUINAL HERNIA WITH MESH;  Surgeon: Violeta Gelinas, MD;  Location: [facility] OR;  Service: General;  Laterality: Left;    INGUINAL HERNIA REPAIR Left 12/18/2019   Procedure: LAPAROSCOPIC ASSISTED REPAIR OF RECURRENT INCARCERATED LEFT INGUINAL HERNIA WITH MESH; LAPAOSCOPIC REPAIR OF SEROSAL TEAR;  Surgeon: Gaynelle Adu, MD;  Location: [facility] OR;  Service: General;  Laterality: Left;   INSERTION OF MESH Left 12/02/2019   Procedure: INSERTION OF MESH;  Surgeon: Violeta Gelinas, MD;  Location: [facility] OR;  Service: General;  Laterality: Left;   JOINT REPLACEMENT     KNEE ARTHROSCOPY Bilateral    REPLACEMENT TOTAL KNEE Bilateral    right groin lymphocele Right 03/29/2009   RIGHT/LEFT HEART CATH AND CORONARY ANGIOGRAPHY N/A 10/06/2019   Procedure: RIGHT/LEFT HEART CATH AND CORONARY ANGIOGRAPHY;  Surgeon: Lyn Records, MD;  Location:  [facility] INVASIVE CV LAB;  Service: Cardiovascular;  Laterality: N/A;   THORACENTESIS  2010 X 2   TONSILLECTOMY      Family History  Problem Relation Age of Onset   Celiac disease Daughter    Aortic aneurysm Father    Hypertension Mother    Heart attack Neg Hx    Stroke Neg Hx     Prior to Admission medications   Medication Sig Start Date End Date Taking? Authorizing Provider  furosemide (LASIX) 40 MG tablet Take 40 mg by mouth daily.   Yes [provider]  metoprolol succinate (TOPROL-XL) 25 MG 24 hr tablet Take 25 mg by mouth daily.   Yes [provider]  acetaminophen (TYLENOL) 500 MG tablet Take 2 tablets (1,000 mg total) by mouth every 6 (six) hours as needed. Patient taking differently: Take 1,000 mg by mouth every 6 (six) hours as needed for mild pain or moderate pain. 09/08/19   Barnetta Chapel, PA-C  albuterol (PROVENTIL) (2.5 MG/3ML) 0.083% nebulizer solution Take by nebulization. 06/22/22   [provider]  amiodarone (PACERONE) 200 MG tablet Take 1 tablet (200 mg total) by mouth daily. 07/12/22   [name], MD  apixaban (ELIQUIS) 2.5 MG TABS tablet Take 1 tablet (2.5 mg total) by mouth 2 (two) times daily. 11/15/22   [name], MD  aspirin 81  MG chewable tablet Chew 1 tablet (81 mg total) by mouth daily. 12/09/22   Burnadette Pop, MD  feeding supplement (ENSURE ENLIVE / ENSURE PLUS) LIQD Take 237 mLs by mouth 3 (three) times daily between meals. 12/08/22   Burnadette Pop, MD  ferrous sulfate (SV IRON) 325 (65 FE) MG tablet Take 1 tablet (325 mg total) by mouth daily with breakfast. 03/17/22   [name], MD  levocetirizine (XYZAL) 5 MG tablet Take 5 mg by mouth daily. 04/21/21   [provider]  linezolid (ZYVOX) 600 MG tablet Take 1 tablet (600 mg total) by mouth every 12 (twelve) hours. 12/09/22   Burnadette Pop, MD  midodrine (PROAMATINE) 10 MG tablet Take 1 tablet (10 mg total) by mouth 3 (three) times daily with meals. 12/08/22   Burnadette Pop, MD  Multiple Vitamin (MULTIVITAMIN WITH MINERALS) TABS tablet Take 1 tablet by mouth daily.    [provider]  Multiple Vitamins-Minerals (ZINC PO) Take 1 tablet by mouth daily at 6 (six) AM.    [provider]  pantoprazole (PROTONIX) 40 MG tablet Take 1 tablet (40 mg total) by mouth daily. 12/09/22   Burnadette Pop, MD  polyethylene glycol (MIRALAX / GLYCOLAX) 17 g packet Take 17 g by mouth daily. 12/09/22   Burnadette Pop, MD  senna (SENOKOT) 8.6 MG TABS tablet Take 1 tablet (8.6 mg total) by mouth 2 (two) times daily. 12/08/22   Burnadette Pop, MD  tamsulosin (FLOMAX) 0.4 MG CAPS capsule Take 0.4 mg by mouth.    [provider]  traMADol (ULTRAM) 50 MG tablet Take 50 mg by mouth every 6 (six) hours as needed for moderate pain. 11/27/22   [provider]    Current Facility-Administered Medications  Medication Dose Route Frequency Provider Last Rate Last Admin   (feeding supplement) PROSource Plus liquid 30 mL  30 mL Oral BID BM Setzer, Lynnell Jude, PA-C   30 mL at 12/20/22 1044   0.9 %  sodium chloride infusion   Intravenous PRN Raulkar, Drema Pry, MD 10 mL/hr at 12/12/22 1515 New Bag at 12/12/22 1515   acetaminophen (TYLENOL) tablet  325-650 mg  325-650 mg Oral Q4H PRN Milinda Antis, PA-C   650 mg at 12/20/22 2353   albuterol (PROVENTIL) (2.5 MG/3ML) 0.083% nebulizer solution 2.5 mg  2.5 mg Nebulization Q2H PRN Milinda Antis, PA-C       amiodarone (PACERONE) tablet 200 mg  200 mg Oral Daily Milinda Antis, PA-C   200 mg at 12/21/22 8295   apixaban (ELIQUIS) tablet 2.5 mg  2.5 mg Oral BID Milinda Antis, PA-C   2.5 mg at 12/21/22 6213   ascorbic acid (VITAMIN C) tablet 500 mg  500 mg Oral Daily Elijah Birk C, DO   500 mg at 12/21/22 0865   aspirin chewable tablet 81 mg  81 mg Oral Daily Milinda Antis, PA-C   81 mg at 12/21/22 7846   doxycycline (VIBRA-TABS) tablet 100 mg  100 mg Oral Q12H Gardiner Barefoot, MD   100 mg at 12/21/22 9629   feeding supplement (BOOST / RESOURCE BREEZE) liquid 1 Container  1 Container Oral TID BM Street, [place], [place]   1 Container at 12/20/22 1000   ferrous sulfate tablet 325 mg  325 mg Oral Q breakfast Raulkar, Drema Pry, MD   325 mg at 12/21/22 0909   loratadine (CLARITIN) tablet 10 mg  10 mg Oral Daily Milinda Antis, PA-C   10 mg at 12/21/22 5284   melatonin tablet 5 mg  5 mg Oral QHS PRN Milinda Antis, PA-C   5 mg at 12/19/22 2109   midodrine (PROAMATINE) tablet 10 mg  10 mg Oral TID WC SetzerLynnell Jude, PA-C   10 mg at 12/21/22 1324   multivitamin with minerals tablet 1 tablet  1 tablet Oral Daily Elijah Birk C, DO   1 tablet at 12/21/22 0908   ondansetron (ZOFRAN) tablet 4 mg  4 mg Oral Q6H PRN Milinda Antis, PA-C   4 mg at 12/19/22 1739   Or   ondansetron (ZOFRAN) injection 4 mg  4 mg Intravenous Q6H PRN Milinda Antis, PA-C   4 mg at 12/16/22 4010   pantoprazole (PROTONIX) EC tablet 40 mg  40 mg Oral Daily Milinda Antis, PA-C   40 mg at 12/21/22 2725   saccharomyces boulardii (FLORASTOR) capsule 250 mg  250 mg Oral BID Horton Chin, MD   250 mg at 12/21/22 0908   senna (SENOKOT) tablet 8.6 mg  1 tablet Oral BID Milinda Antis, PA-C   8.6 mg at 12/21/22  3664   sodium chloride flush (NS) 0.9 % injection 10-40 mL  10-40 mL Intracatheter Q12H Raulkar, Drema Pry, MD   10 mL at 12/21/22 1011   sodium chloride flush (NS) 0.9 % injection 10-40 mL  10-40 mL Intracatheter PRN Raulkar, Drema Pry, MD       tamsulosin (FLOMAX) capsule 0.4 mg  0.4 mg Oral Daily Milinda Antis, PA-C   0.4 mg at 12/21/22 4034   traMADol (ULTRAM) tablet 50 mg  50 mg Oral Q8H PRN Raulkar, Drema Pry, MD       Vitamin D (Ergocalciferol) (DRISDOL) 1.25 MG (50000 UNIT) capsule 50,000 Units  50,000 Units Oral Q7 days Horton Chin, MD   50,000 Units at 12/17/22 1805    Allergies as of 12/08/2022 - Review Complete 12/08/2022  Allergen Reaction Noted   Lortab [hydrocodone-acetaminophen] Hives 05/30/2012   Morphine and codeine Hives 05/18/2011   Other Other (See Comments) 09/04/2019    Social History   Socioeconomic History  Marital status: Married    Spouse name: Not on file   Number of children: Not on file   Years of education: Not on file   Highest education level: Not on file  Occupational History   Not on file  Tobacco Use   Smoking status: Former    Packs/day: 0    Types: Cigarettes    Quit date: 04/16/1974    Years since quitting: 48.7   Smokeless tobacco: Never  Vaping Use   Vaping Use: Never used  Substance and Sexual Activity   Alcohol use: No   Drug use: No   Sexual activity: Not Currently  Other Topics Concern   Not on file  Social History Narrative   Not on file   Social Determinants of Health   Financial Resource Strain: Not on file  Food Insecurity: No Food Insecurity (12/09/2022)   Hunger Vital Sign    Worried About Running Out of Food in the Last Year: Never true    Ran Out of Food in the Last Year: Never true  Transportation Needs: No Transportation Needs (12/09/2022)   PRAPARE - Administrator, Civil Service (Medical): No    Lack of Transportation (Non-Medical): No  Physical Activity: Not on file  Stress: Not on  file  Social Connections: Not on file  Intimate Partner Violence: Not At Risk (12/09/2022)   Humiliation, Afraid, Rape, and Kick questionnaire    Fear of Current or Ex-Partner: No    Emotionally Abused: No    Physically Abused: No    Sexually Abused: No     Code Status   Code Status: DNR  Review of Systems: All systems reviewed and negative except where noted in HPI.  Physical Exam: Vital signs in last 24 hours: Temp:  [97.7 F (36.5 C)-98.1 F (36.7 C)] 97.8 F (36.6 C) (07/04 0403) Pulse Rate:  [81-95] 81 (07/04 0403) Resp:  [16-18] 16 (07/04 0403) BP: (92-119)/(48-73) 92/48 (07/04 0403) SpO2:  [91 %-100 %] 91 % (07/04 0403) Weight:  [117.2 kg] 117.2 kg (07/04 0500) Last BM Date : 12/21/22  General:  Pleasant frail male in NAD Psych:  Cooperative. Normal mood and affect Eyes: Pupils equal Ears:  Normal auditory acuity Nose: No deformity, discharge or lesions Neck:  Supple, no masses felt Lungs:  Clear to auscultation.  Heart:  Regular rate, regular rhythm.  Abdomen:  Soft, nondistended, nontender, active bowel sounds, no masses felt Rectal :  Deferred Msk: Symmetrical without gross deformities.  Neurologic:  Alert, oriented, grossly normal neurologically  Intake/Output from previous day: 07/03 0701 - 07/04 0700 In: 958 [P.O.:600; Blood:358] Out: 125 [Urine:125] Intake/Output this shift:  Total I/O In: 118 [P.O.:118] Out: 50 [Urine:50]     Willette Cluster, NP-C @  12/21/2022, 11:31 AM

## 2022-12-21 NOTE — Progress Notes (Signed)
PROGRESS NOTE   Subjective/Complaints: No new complaints this morning Consulted nephrology regarding worsening creatinine 2nd stool occult sent to lab and is also positive  No specific complaints.  ROS:  Pt denies SOB, abd pain, CP, N/V/C/D, and vision changes, no more confusion, +lower extremity edema   Except for HPI Objective:   No results found. Recent Labs    12/20/22 0535 12/21/22 0620  WBC 4.4 4.8  HGB 6.7* 7.6*  HCT 22.9* 25.8*  PLT 73* 71*    Recent Labs    12/20/22 0535 12/21/22 0620  NA 146* 143  K 4.3 4.4  CL 104 105  CO2 28 24  GLUCOSE 94 88  BUN 53* 57*  CREATININE 1.87* 2.03*  CALCIUM 8.9 8.7*    Intake/Output Summary (Last 24 hours) at 12/21/2022 0959 Last data filed at 12/21/2022 0942 Gross per 24 hour  Intake 956 ml  Output 125 ml  Net 831 ml     Pressure Injury 11/29/22 Heel Right Stage 3 -  Full thickness tissue loss. Subcutaneous fat may be visible but bone, tendon or muscle are NOT exposed. (Active)  11/29/22 1046  Location: Heel  Location Orientation: Right  Staging: Stage 3 -  Full thickness tissue loss. Subcutaneous fat may be visible but bone, tendon or muscle are NOT exposed.  Wound Description (Comments):   Present on Admission: Yes     Pressure Injury 12/08/22 Sacrum Deep Tissue Pressure Injury - Purple or maroon localized area of discolored intact skin or blood-filled blister due to damage of underlying soft tissue from pressure and/or shear. large area purple area extending to the co (Active)  12/08/22 1910  Location: Sacrum  Location Orientation:   Staging: Deep Tissue Pressure Injury - Purple or maroon localized area of discolored intact skin or blood-filled blister due to damage of underlying soft tissue from pressure and/or shear.  Wound Description (Comments): large area purple area extending to the coccyx & bilateral upper buttocks, with open areas. Charted by WOC as  DTI in evolution on 6/18  Present on Admission: Yes    Physical Exam: Vital Signs Blood pressure (!) 92/48, pulse 81, temperature 97.8 F (36.6 C), resp. rate 16, height 5\' 10"  (1.778 m), weight 117.2 kg, SpO2 91 %. General: awake, alert, sitting up in bed;  NAD, BMI 37.07 HENT: conjugate gaze; oropharynx moist CV: regular rate; no JVD, hypotensive Pulmonary: - decreased at bases- wearing 2L O2 by [place]- not in nose GI: soft, NT, ND, (+)BS Psychiatric: calm Neurological: alert, but not sure why here Skin: Multiple wounds + Sacral PI, Stage 4 + R heel PI, unstageable + R 2nd toe extruded bone; no apparent drainage + L second toe wound with clean base, bloody drainage + B/l shin wounds, healing                                 MSK:      No apparent deformity. BL UE shoulder abduction limited to <90 degrees d/t bilateral rotator cuff tears.      Strength:  RUE: 3/5 SA, 3/5 EF, 3/5 EE, 4/5 WE, 4/5 FF, 4/5 FA                 LUE: 3/5 SA, 3/5 EF, 3/5 EE, 4/5 WE, 4/5 FF, 4/5 FA                 RLE: 2/5 HF, 4/5 KE, 4/5 DF, 4/5 PF                 LLE:  2/5 HF, 4/5 KE, 4/5 DF, 4/5 PF    Neurologic exam:  Cognition: AAO to person, place, time and event.  Language: Fluent, No substitutions or neoglisms. +Hypophonic, breathy vocalizations Memory: Recalls 2/3 objects at 5 minutes.  Insight: Poor insight into current condition.  Mood: Flat affect, appropriate mood.  Sensation: To light touch reduced in bilateral LEs Reflexes: 2+ in BL UE and Les  CN: 2-12 grossly intact.  Coordination: R>L UE intention tremors Spasticity: MAS 0 in all extremities.    Assessment/Plan: 1. Functional deficits which require 3+ hours per day of interdisciplinary therapy in a comprehensive inpatient rehab setting. Physiatrist is providing close team supervision and 24 hour management of active medical problems listed below. Physiatrist and rehab team continue to assess barriers  to discharge/monitor patient progress toward functional and medical goals  Care Tool:  Bathing    Body parts bathed by patient: Abdomen, Face, Chest   Body parts bathed by helper: Front perineal area, Buttocks, Right upper leg, Left upper leg, Right lower leg, Left lower leg, Right arm, Left arm     Bathing assist Assist Level: Maximal Assistance - Patient 24 - 49%     Upper Body Dressing/Undressing Upper body dressing   What is the patient wearing?: Pull over shirt    Upper body assist Assist Level: Maximal Assistance - Patient 25 - 49%    Lower Body Dressing/Undressing Lower body dressing      What is the patient wearing?: Pants     Lower body assist Assist for lower body dressing: Total Assistance - Patient < 25%     Toileting Toileting Toileting Activity did not occur Press photographer and hygiene only): N/A (no void or bm)  Toileting assist Assist for toileting: Dependent - Patient 0%     Transfers Chair/bed transfer  Transfers assist     Chair/bed transfer assist level: Contact Guard/Touching assist Chair/bed transfer assistive device: Geologist, engineering   Ambulation assist      Assist level: Contact Guard/Touching assist Assistive device: Walker-rolling Max distance: 164ft   Walk 10 feet activity   Assist     Assist level: Contact Guard/Touching assist Assistive device: Walker-rolling   Walk 50 feet activity   Assist Walk 50 feet with 2 turns activity did not occur: Safety/medical concerns  Assist level: Contact Guard/Touching assist Assistive device: Walker-rolling    Walk 150 feet activity   Assist Walk 150 feet activity did not occur: Safety/medical concerns         Walk 10 feet on uneven surface  activity   Assist Walk 10 feet on uneven surfaces activity did not occur: Safety/medical concerns         Wheelchair     Assist Is the patient using a wheelchair?: Yes Type of Wheelchair: Manual     Wheelchair assist level: Dependent - Patient 0%      Wheelchair 50 feet with 2 turns activity    Assist        Assist Level:  Dependent - Patient 0%   Wheelchair 150 feet activity     Assist      Assist Level: Dependent - Patient 0%   Blood pressure (!) 92/48, pulse 81, temperature 97.8 F (36.6 C), resp. rate 16, height 5\' 10"  (1.778 m), weight 117.2 kg, SpO2 91 %.  Medical Problem List and Plan: 1. Functional deficits secondary to debility from loculated pleural effusion and MRSA pneumonia             -patient may shower             -ELOS/Goals: 7 to 10 days, PT/OT SPV   Continue CIR  2.  Atrial fibrillation: continue Eliquis             -antiplatelet therapy: Aspirin    3. B//l foot pain: continue tylenol  4. Multiple wounds             -local care to sacrum, bilateral toes (Dr. Lajoyce Corners)  -discussed that Dr. Lajoyce Corners does not recommend amputation due to poor vascular status             -continue pressure relief/Prevalon boots/mattress             - Added multivitamin with minerals and ascorbic acid 500 mg daily for wound healing on admission    7. Fluids/Electrolytes/Nutrition/Malnutrition: Routine Is and Os and follow-up chemistries             - Poor POS, no appetite; Ensure TID, consider Marinol    8: Hypotension: monitor TID and prn             -continue midodrine    6/30- BP soft, but no hypotension per nursing/not dizzy 9: Hyperlipidemia: continue statin   10: Urinary retention: continue flomax since still having urgency   11: combined chronic systolic/diastolic heart failure: daily weight             -continue Pacerone 200 mg daily             -on no diuretics   6/24- weight bumped up dramatically today- from 115 to 119- will recheck in AM before giving diuretics- will verify, since off due to CKD/AKI  6/29- Weight was 119 kg yesterday but 116 kg today- more his baseline per review of weights in last 8 days.   6/30- weight down more due to Lasix-  down to 113 kg- however Cr up to 1.7 and BUN up to 42- will recheck CMP in AM and BNP and allow team to decide plan 12: chronic atrial fibrillation: rate controlled             -on Eliquis, Pacerone   6/24- pt told me this AM- never had Afib/irregular heart rate- did research to find he does have it- and pt poor historian 13: Pneumonia with effusion/MRSA sepsis:             -continue Zyvox for 4 weeks             -O2 via [place] at 2 L             -BiPAP as needed/consult respiratory therapy  6/24- will get PICC line out since on oral ABX. Breathing "good" per pt- denies SOB   14: s/p AVR, stable thoracic aortic aneurysm: follow-up outpatient   15: AKI atop CKD IIIb: (baseline 1.2)             -slowing improving, encourage adequate hydration   Daily BMP  -Cr reviewed and worsening, consulted  nephrology and lasix held until goal creatinine of 1.5-1.6  16: Chronic anemia/iron deficiency: f/u CBC on Mondays. Iron supplement started   Hgb reviewed and decreased to 6.7 on 7.3, 1 U PRBC ordered  2nd stool occult ordered and has returned positive, will order CT abdomen  17: Hard of hearing: wife to bring in hearing aids  18. Suboptimal vitamin D: continue ergocalciferol 50,000U one per week for 7 weeks  19. Loose stool: florastor added  20. Constipation: miralax d/ced, reviewed chart- last BM 6/24  6/29- LBM this AM  21. 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 UTI: start keflex, discussed 7 day course with wife  74. Confusion: likely 2/2 UTI, improved with Keflex, discussed that patient will complete 7 day course, discussed with wife that confusion has not recurred  24. Pleural effusion: d/c lasix. CXR ordered to monitor pleural effusoin  25. Hypotension: transition lasix to oral, critical care note reviewed, d/c lasix, continue midodrine 10mg  TID  >50 minutes spent reviewing blood pressure, discontinuing lasix,  listening to lung sounds, consulting nephrology regarding worsening creatinine, CXR ordered to monitor pleural effusion, 2nd stool occult ordered  LOS: 13 days A FACE TO FACE EVALUATION WAS PERFORMED  [name] [name] 12/21/2022, 9:59 AM

## 2022-12-22 ENCOUNTER — Inpatient Hospital Stay (HOSPITAL_COMMUNITY): Payer: Medicare Other

## 2022-12-22 DIAGNOSIS — I5042 Chronic combined systolic (congestive) and diastolic (congestive) heart failure: Secondary | ICD-10-CM

## 2022-12-22 DIAGNOSIS — Z792 Long term (current) use of antibiotics: Secondary | ICD-10-CM

## 2022-12-22 DIAGNOSIS — D649 Anemia, unspecified: Secondary | ICD-10-CM

## 2022-12-22 DIAGNOSIS — A4102 Sepsis due to Methicillin resistant Staphylococcus aureus: Secondary | ICD-10-CM | POA: Insufficient documentation

## 2022-12-22 DIAGNOSIS — N179 Acute kidney failure, unspecified: Secondary | ICD-10-CM

## 2022-12-22 LAB — CBC WITH DIFFERENTIAL/PLATELET
Abs Immature Granulocytes: 0.04 10*3/uL (ref 0.00–0.07)
Basophils Absolute: 0 10*3/uL (ref 0.0–0.1)
Basophils Relative: 0 %
Eosinophils Absolute: 0.1 10*3/uL (ref 0.0–0.5)
Eosinophils Relative: 3 %
HCT: 25.1 % — ABNORMAL LOW (ref 39.0–52.0)
Hemoglobin: 7.7 g/dL — ABNORMAL LOW (ref 13.0–17.0)
Immature Granulocytes: 1 %
Lymphocytes Relative: 11 %
Lymphs Abs: 0.6 10*3/uL — ABNORMAL LOW (ref 0.7–4.0)
MCH: 29.8 pg (ref 26.0–34.0)
MCHC: 30.7 g/dL (ref 30.0–36.0)
MCV: 97.3 fL (ref 80.0–100.0)
Monocytes Absolute: 0.3 10*3/uL (ref 0.1–1.0)
Monocytes Relative: 5 %
Neutro Abs: 4.1 10*3/uL (ref 1.7–7.7)
Neutrophils Relative %: 80 %
Platelets: 64 10*3/uL — ABNORMAL LOW (ref 150–400)
RBC: 2.58 MIL/uL — ABNORMAL LOW (ref 4.22–5.81)
RDW: 18.6 % — ABNORMAL HIGH (ref 11.5–15.5)
WBC: 5.2 10*3/uL (ref 4.0–10.5)
nRBC: 0 % (ref 0.0–0.2)

## 2022-12-22 LAB — BASIC METABOLIC PANEL
Anion gap: 14 (ref 5–15)
BUN: 64 mg/dL — ABNORMAL HIGH (ref 8–23)
CO2: 23 mmol/L (ref 22–32)
Calcium: 8.6 mg/dL — ABNORMAL LOW (ref 8.9–10.3)
Chloride: 106 mmol/L (ref 98–111)
Creatinine, Ser: 2.08 mg/dL — ABNORMAL HIGH (ref 0.61–1.24)
GFR, Estimated: 31 mL/min — ABNORMAL LOW (ref >60)
Glucose, Bld: 84 mg/dL (ref 70–99)
Potassium: 4.4 mmol/L (ref 3.5–5.1)
Sodium: 143 mmol/L (ref 135–145)

## 2022-12-22 MED ORDER — ASCORBIC ACID 500 MG PO TABS: 500.0000 mg | ORAL_TABLET | Freq: Every day | ORAL | Status: DC

## 2022-12-22 MED ORDER — SENNA 8.6 MG PO TABS: 1.0000 | ORAL_TABLET | Freq: Two times a day (BID) | ORAL | 0 refills | Status: DC

## 2022-12-22 MED ORDER — DOXYCYCLINE HYCLATE 100 MG PO TABS: 100.0000 mg | ORAL_TABLET | Freq: Two times a day (BID) | ORAL | 1 refills | Status: DC

## 2022-12-22 MED ORDER — SACCHAROMYCES BOULARDII 250 MG PO CAPS: 250.0000 mg | ORAL_CAPSULE | Freq: Two times a day (BID) | ORAL | 0 refills | Status: DC

## 2022-12-22 MED ORDER — VITAMIN D (ERGOCALCIFEROL) 1.25 MG (50000 UNIT) PO CAPS: 50000.0000 [IU] | ORAL_CAPSULE | ORAL | 0 refills | Status: DC

## 2022-12-22 MED ORDER — ACETAMINOPHEN 325 MG PO TABS: 325.0000 mg | ORAL_TABLET | ORAL | Status: DC | PRN

## 2022-12-22 MED ORDER — PANTOPRAZOLE SODIUM 40 MG PO TBEC: 40.0000 mg | DELAYED_RELEASE_TABLET | Freq: Two times a day (BID) | ORAL | 0 refills | Status: DC

## 2022-12-22 MED ORDER — FUROSEMIDE 40 MG PO TABS: 40.0000 mg | ORAL_TABLET | Freq: Every day | ORAL | 0 refills | Status: DC

## 2022-12-22 MED ORDER — FERROUS SULFATE 325 (65 FE) MG PO TABS: 325.0000 mg | ORAL_TABLET | Freq: Every day | ORAL | 0 refills | Status: DC

## 2022-12-22 MED ORDER — ACETAMINOPHEN 500 MG PO TABS: 500.0000 mg | ORAL_TABLET | Freq: Four times a day (QID) | ORAL | 0 refills | Status: DC | PRN

## 2022-12-22 MED ORDER — MIDODRINE HCL 10 MG PO TABS: 10.0000 mg | ORAL_TABLET | Freq: Three times a day (TID) | ORAL | 0 refills | Status: DC

## 2022-12-22 MED ORDER — SODIUM CHLORIDE 0.9 % IV SOLN
250.0000 mg | Freq: Every day | INTRAVENOUS | Status: DC
  Filled 2022-12-22 (×2): qty 20

## 2022-12-22 MED ORDER — MELATONIN 5 MG PO TABS: 5.0000 mg | ORAL_TABLET | Freq: Every evening | ORAL | 0 refills | Status: DC | PRN

## 2022-12-22 NOTE — Progress Notes (Signed)
Occupational Therapy Session Note  Patient Details  Name: [name] [name] MRN: [id number] Date of Birth: 08/26/1940  Today's Date: 12/22/2022 OT Individual Time: 0905-1000 OT Individual Time Calculation (min): 55 min    Short Term Goals: Week 2:  OT Short Term Goal 1 (Week 2): STG = LTG due to ELOS  Skilled Therapeutic Interventions/Progress Updates:  Skilled OT intervention completed with focus on functional endurance, balance, activity tolerance. Pt received seated in recliner, agreeable to session. No pain reported.  Nurse in room to administer meds. Then MD present for rounds. Completed BIMS for DC requirements with noted improvement in score from 12/15 to 15/15.  Min A sit > stand using RW, then short ambulatory transfer with supervision using RW to w/c. Transported pt dependently in w/c <> gym. Then light min A sit > stand using RW with cues needed for anterior weight shifting, then supervision ambulatory transfer to EOM. Rest break needed for fatigue.  Light min A needed for sit > stand with RW, then pt ambulated 98 ft x2 using RW with supervision, with intermittent seated rest breaks provided for fatigue and recovery.  Back in room, completed min A sit > stand and supervision stand pivot with RW to recliner. PA present, with OT answering questions about pt's vitals and recovery period trends in the past week.  Pt remained seated in recliner with BLE elevated, with wife in room with all needs in reach at end of session.  Vitals -Received on RA, SPO2 94% -desat to 92% after activity but able to rebound 96% or > with extended pursed lip breathing breaks for 3-5 min each     Therapy Documentation Precautions:  Precautions Precautions: Fall, Other (comment) Precaution Comments: awaiting bilateral 2nd toe amputations, sacral wound, hx of Afib, hx of bil rotator cuff tears Required Braces or Orthoses: Other Brace Other Brace: prevalon boots for skin  protection Restrictions Weight Bearing Restrictions: No    Therapy/Group: Individual Therapy  Benno Brensinger E Issacc Merlo, MS, OTR/L  12/22/2022, 12:12 PM

## 2022-12-22 NOTE — Progress Notes (Signed)
Physical Therapy Session Note  Patient Details  Name: [name] [name] MRN: [id number] Date of Birth: 11/24/1940  Today's Date: 12/22/2022 PT Individual Time: 1052-1207 PT Individual Time Calculation (min): 75 min   Short Term Goals: Week 1:  PT Short Term Goal 1 (Week 1): Pt will perform supine<>sit with min assist PT Short Term Goal 1 - Progress (Week 1): Progressing toward goal PT Short Term Goal 2 (Week 1): Pt will consistently perform sit<>stands using LRAD with min assist PT Short Term Goal 2 - Progress (Week 1): Progressing toward goal PT Short Term Goal 3 (Week 1): Pt will perform bed<>chair transfers using LRAD with min assist PT Short Term Goal 3 - Progress (Week 1): Met PT Short Term Goal 4 (Week 1): Pt will ambulate at least 56ft using LRAD with min assist of 1 and +2 for line management as needed PT Short Term Goal 4 - Progress (Week 1): Met PT Short Term Goal 5 (Week 1): Pt will navigate 4 steps using HRs with mod assist of 1 and +2 for safety if needed PT Short Term Goal 5 - Progress (Week 1): Progressing toward goal  Skilled Therapeutic Interventions/Progress Updates:   Pt sitting in recliner; wife present. Pt agreeable to tx; no complaints of pain.  Gait x 124 ft, x 142 ft, x 18 ft with RW CGA and verbal cues for foot clearance, breathing techniques and stride. Pt limited by fatigue.  Therex: STS from recliner with PT mod A progressing to CGA x 10 and in between each exercise; minisquats x 20, STS from chair without arm rest with PT min A; standing SLR with RW, hip abduction with RW, UE reaches various angles, and weightshifts all directions for functional strengthening and balance challenge. standing functional mobility tasks to increase LE strengthening in WB performed with longest standing tolerance at 3.25 minutes prior to rest break requests with usage of RW. Bil seated LAQ x 20 with 2-3 sec iso hold.   O2 remained > 94 percent throughout tx.   All performed to  increase functional strengthening and endurance. Pt continues to be limited by fatigue.  Pt left in recliner chair, wife present, no complaints of pain.      Therapy Documentation Precautions:  Precautions Precautions: Fall, Other (comment) Precaution Comments: awaiting bilateral 2nd toe amputations, sacral wound, hx of Afib, hx of bil rotator cuff tears Required Braces or Orthoses: Other Brace Other Brace: prevalon boots for skin protection Restrictions Weight Bearing Restrictions: No    Therapy/Group: Individual Therapy  Luna Fuse 12/22/2022, 7:25 AM

## 2022-12-22 NOTE — Progress Notes (Signed)
Inpatient Rehabilitation Care Coordinator Discharge Note   Patient Details  Name: [name] [name] MRN: [id number] Date of Birth: [birth year]   Discharge location: Home with spouse  Length of Stay: 15 Days  Discharge activity level: Supervision  Home/community participation: Spouse  Patient response XB:JYNWGN Literacy - How often do you need to have someone help you when you read instructions, pamphlets, or other written material from your doctor or pharmacy?: Always  Patient response FA:OZHYQM Isolation - How often do you feel lonely or isolated from those around you?: Never  Services provided included: SW, Neuropsych, Pharmacy, TR, CM, RN, SLP, OT, PT, RD, MD  Financial Services:  Financial Services Utilized: Medicare    Choices offered to/list presented to: Spouse  Follow-up services arranged:  Home Health, DME Home Health Agency: Karie Chimera PT OT    DME : Wheelchair, Bedside Commode, ROHO cushion and Home 02 set up- [employer]    Patient response to transportation need: Is the patient able to respond to transportation needs?: Yes In the past 12 months, has lack of transportation kept you from medical appointments or from getting medications?: No In the past 12 months, has lack of transportation kept you from meetings, work, or from getting things needed for daily living?: No   Patient/Family verbalized understanding of follow-up arrangements:  Yes  Individual responsible for coordination of the follow-up plan: [name] [phone]  Confirmed correct DME delivered: Andria Rhein 12/22/2022    Comments (or additional information):  Summary of Stay    Date/Time Discharge Planning CSW  12/19/22 1307 Discharging home with spouse. HH established with [facility]. CJB  12/12/22 1353 Discharging home with spouse 24/7. 1 level mobile home, 4 steps. CJB       Andria Rhein

## 2022-12-22 NOTE — Progress Notes (Signed)
Physical Therapy Discharge Summary  Patient Details  Name: [name] [name] MRN: [id number] Date of Birth: [birth year]  Date of Discharge from PT service:December 22, 2022  Today's Date: 12/22/2022 PT Individual Time: 1345-1455 PT Individual Time Calculation (min): 70 min   Patient has met 6 of 9 long term goals due to improved activity tolerance, improved balance, improved postural control, increased strength, increased range of motion, decreased pain, ability to compensate for deficits, improved awareness, and improved coordination. Patient to discharge at an ambulatory level  CGA/supervision using RW . Patient's care partner is independent to provide the necessary physical assistance at discharge. [name] wife attended family education training and was cleared to assist pt to/from bathroom during CIR stay. She has verbalized and demonstrated confidence with tasks to ensure safe discharge home.   Reasons goals not met: pt did not meet sit<>stand goal of CGA for car transfer goal of CGA as pt currently requires min A due to global weakness/deconditioning. Pt also did not meet bed mobility goal due to global weakness/deconditioning.   Recommendation:  Patient will benefit from ongoing skilled PT services in home health setting to continue to advance safe functional mobility, address ongoing impairments in transfers, generalized strengthening and endurance, dynamic standing balance/coordination, gait training, stair navigation, and to minimize fall risk.  Equipment: No equipment provided - already has RW  Reasons for discharge: treatment goals met and discharge from hospital  Patient/family agrees with progress made and goals achieved: Yes  Today's Interventions Received pt sitting in recliner with wife present at bedside. WC delivered to room but pt/wife did not request one - notified CSW. Pt agreeable to PT treatment and denied any pain during session. Pt on RA with SPO2 >93% throughout session.  Session with emphasis on functional mobility/transfers, generalized strengthening and endurance, dynamic standing balance/coordination, and gait training.   Pt performed all transfers with RW and min A overall throughout session. Pt ambulated 153ft x 2 trials with RW and CGA to/from dayroom. Stood from elevated EOM with RW and CGA and ambulated additional 126ft with RW and CGA - SPO2 100% afterwards with pt reporting mild SOB. Worked on dynamic standing balance performing alternating toe taps to 3in step 2x10 bilaterally with CGA for balance and heavy reliance on BUE support on RW. Pt performed TUG with RW and CGA with average of 31.6 seconds. Pt educated on test results and significance indicating high fall risk and importance of using RW upon D/C - pt in agreement Trial 1: 28 seconds Trial 2: 37 seconds Trial 3: 30 seconds  Pt then performed the following seated exercises with emphasis on LE strength/ROM: -hip adduction ball squeezes 2x15 -knee extension 2x15 bilaterally with 1.5lb ankle weight -hip flexion 2x10 bilaterally with 1.5lb ankle weight  Returned to room and concluded session with pt sitting in recliner with all needs within reach and wife present at bedside.   PT Discharge Precautions/Restrictions Precautions Precautions: Fall;Other (comment) Precaution Comments: awaiting bilateral 2nd toe amputations, sacral wound, hx of Afib, hx of bil rotator cuff tears Required Braces or Orthoses: Other Brace Other Brace: prevalon boots for skin protection Restrictions Weight Bearing Restrictions: No Pain Interference Pain Interference Pain Effect on Sleep: 1. Rarely or not at all Pain Interference with Therapy Activities: 1. Rarely or not at all Pain Interference with Day-to-Day Activities: 1. Rarely or not at all Cognition Overall Cognitive Status: Within Functional Limits for tasks assessed Arousal/Alertness: Awake/alert Orientation Level: Oriented X4 Memory: Appears  intact Awareness: Appears intact Problem Solving: Appears  intact Safety/Judgment: Appears intact Comments: flat affect Sensation Sensation Light Touch: Appears Intact Hot/Cold: Not tested Proprioception: Appears Intact Stereognosis: Not tested Additional Comments: denied any numbness/tingling/burning in bilateral LEs Coordination Gross Motor Movements are Fluid and Coordinated: No Fine Motor Movements are Fluid and Coordinated: No Coordination and Movement Description: generalized weakness/deconditioning, fatigue, and decreased balance/coordination Finger Nose Finger Test: tremors bilaterally Heel Shin Test: decreased coordination bilaterally Motor  Motor Motor: Other (comment) Motor - Skilled Clinical Observations: global weakness/deconditioning and easily fatigued  Mobility Bed Mobility Bed Mobility: Rolling Right;Rolling Left;Sit to Supine;Supine to Sit Rolling Right: Supervision/verbal cueing Rolling Left: Supervision/Verbal cueing Supine to Sit: Supervision/Verbal cueing Sit to Supine: Moderate Assistance - Patient 50-74% Transfers Transfers: Sit to Stand;Stand to Sit;Stand Pivot Transfers Sit to Stand: Minimal Assistance - Patient > 75% Stand to Sit: Supervision/Verbal cueing Stand Pivot Transfers: Supervision/Verbal cueing Stand Pivot Transfer Details: Verbal cues for technique;Verbal cues for precautions/safety;Verbal cues for gait pattern Transfer (Assistive device): Rolling walker Locomotion  Gait Ambulation: Yes Gait Assistance: Contact Guard/Touching assist Gait Distance (Feet): 180 Feet Assistive device: Rolling walker Gait Assistance Details: Verbal cues for precautions/safety;Verbal cues for gait pattern Gait Assistance Details: verbal cues to widen BOS and for upright posture/gaze Gait Gait: Yes Gait Pattern: Impaired Gait Pattern: Decreased step length - left;Decreased step length - right;Step-to pattern;Poor foot clearance - left;Poor foot clearance -  right;Narrow base of support;Trunk flexed Gait velocity: decreased Stairs / Additional Locomotion Stairs: Yes Stairs Assistance: Minimal Assistance - Patient > 75% Stair Management Technique: One rail Right Number of Stairs: 4 Height of Stairs: 6 Ramp: Contact Guard/touching assist Pick up small object from the floor assist level: Contact Guard/Touching assist Pick up small object from the floor assistive device: RW and Chief Operating Officer Mobility: No Wheelchair Assistance: Dependent - Patient 0% Wheelchair Parts Management: Needs assistance Distance: >153ft  Trunk/Postural Assessment  Cervical Assessment Cervical Assessment: Exceptions to [facility] (forward head) Thoracic Assessment Thoracic Assessment: Exceptions to [facility] (rounded shoulders with thoracic kyphosis) Lumbar Assessment Lumbar Assessment: Exceptions to [facility] (posterior pelvic tilt) Postural Control Postural Control: Deficits on evaluation (heavy reliance on BUE support on RW)  Balance Balance Balance Assessed: Yes Static Sitting Balance Static Sitting - Balance Support: Feet supported Static Sitting - Level of Assistance: 7: Independent Dynamic Sitting Balance Dynamic Sitting - Balance Support: Feet supported;No upper extremity supported Dynamic Sitting - Level of Assistance: 6: Modified independent (Device/Increase time) Static Standing Balance Static Standing - Balance Support: Bilateral upper extremity supported;During functional activity (RW) Static Standing - Level of Assistance: 5: Stand by assistance (supervision) Dynamic Standing Balance Dynamic Standing - Balance Support: During functional activity;Bilateral upper extremity supported (RW) Dynamic Standing - Level of Assistance: 5: Stand by assistance (CGA) Dynamic Standing - Comments: with gait Extremity Assessment  RLE Assessment RLE Assessment: Exceptions to [facility] General Strength Comments: tested sitting in recliner RLE Strength Right  Hip Flexion: 4-/5 Right Hip Extension: 3-/5 Right Hip ABduction: 4-/5 Right Hip ADduction: 4-/5 Right Knee Flexion: 3+/5 Right Knee Extension: 3+/5 Right Ankle Dorsiflexion: 2+/5 Right Ankle Plantar Flexion: 3/5 LLE Assessment LLE Assessment: Exceptions to [facility] General Strength Comments: tested sitting in recliner LLE Strength Left Hip Flexion: 4-/5 Left Hip Extension: 2+/5 Left Hip ABduction: 4-/5 Left Hip ADduction: 4-/5 Left Knee Flexion: 3+/5 Left Knee Extension: 3/5 Left Ankle Dorsiflexion: 2+/5 Left Ankle Plantar Flexion: 3/5   Adalyna Godbee M Zaunegger [name] PT, DPT 12/22/2022, 7:17 AM

## 2022-12-22 NOTE — Progress Notes (Signed)
Discussed labs, CXR, nephrology and GI recommendations with wife and patient.  Wife reports Hgb at baseline is 7-8 range and does not want patient put to sleep. Also discussed extending LOS to monitor renal status but patient adamant about discharge tomorrow. Had finished PT session and overall endurance/respiratory status has improved per reports. Wife would like pulmonary input and reached out to [facility] regarding effusion and question need for tap? They will follow up for input.

## 2022-12-22 NOTE — Progress Notes (Signed)
Occupational Therapy Discharge Summary  Patient Details  Name: [name] [name] MRN: [id number] Date of Birth: 12-15-1940  Date of Discharge from OT service:December 22, 2022   Patient has met 6 of 8 long term goals due to improved activity tolerance, improved balance, functional use of  RIGHT upper, RIGHT lower, LEFT upper, and LEFT lower extremity, improved awareness, and improved coordination.  Patient to discharge at [facility] Assist level. Patient's care partner is independent to provide the necessary physical assistance at discharge and has been present for all sessions and participated in hands on education and has verbalized her readiness to assist pt at his CLOF.  Reasons goals not met: Sit > stand goal and toileting goal not met at supervision level due to min A needed for balance, endurance, and general strength deficits requiring increased assist.  Recommendation:  Patient will benefit from ongoing skilled OT services in home health setting to continue to advance functional skills in the area of BADL and Reduce care partner burden.  Equipment: BSC  Reasons for discharge: treatment goals met  Patient/family agrees with progress made and goals achieved: Yes  OT Discharge Precautions/Restrictions  Precautions Precautions: Fall;Other (comment) Precaution Comments: awaiting bilateral 2nd toe amputations, sacral wound, hx of Afib, hx of bil rotator cuff tears Required Braces or Orthoses: Other Brace Other Brace: prevalon boots for skin protection Restrictions Weight Bearing Restrictions: No ADL ADL Eating: Set up Where Assessed-Eating: Wheelchair Grooming: Setup Where Assessed-Grooming: Sitting at sink Upper Body Bathing: Setup Where Assessed-Upper Body Bathing: Sitting at sink Lower Body Bathing: Minimal assistance Where Assessed-Lower Body Bathing: Sitting at sink, Standing at sink Upper Body Dressing: Minimal assistance Where Assessed-Upper Body Dressing: Sitting at  sink Lower Body Dressing: Minimal assistance Where Assessed-Lower Body Dressing: Sitting at sink, Standing at sink Toileting: Minimal assistance Where Assessed-Toileting: Bedside Commode, Toilet Toilet Transfer: Close supervision Toilet Transfer Method: Proofreader: Bedside commode, Grab bars, Other (comment) (RW) Tub/Shower Transfer: Not assessed Tub/Shower Transfer Method: Unable to assess Film/video editor: Not assessed Film/video editor Method: Unable to assess ADL Comments: Shower transfers not assessed due to pt with open wounds and will not shower at home Vision Baseline Vision/History: 1 Wears glasses (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) Patient Visual Report: No change from baseline Vision Assessment?: No apparent visual deficits Perception  Perception: Within Functional Limits Praxis Praxis: Intact Cognition Cognition Overall Cognitive Status: Within Functional Limits for tasks assessed Arousal/Alertness: Awake/alert Orientation Level: Person;Place;Situation Person: Oriented Place: Oriented Situation: Oriented Memory: Appears intact Awareness: Appears intact Problem Solving: Appears intact Safety/Judgment: Appears intact Comments: flat affect Brief Interview for Mental Status (BIMS) Repetition of Three Words (First Attempt): 3 Temporal Orientation: Year: Correct Temporal Orientation: Month: Accurate within 5 days Temporal Orientation: Day: Correct Recall: "Sock": Yes, no cue required Recall: "Blue": Yes, no cue required Recall: "Bed": Yes, no cue required BIMS Summary Score: 15 Sensation Sensation Light Touch: Appears Intact Hot/Cold: Not tested Proprioception: Appears Intact Stereognosis: Not tested Additional Comments: denied any numbness/tingling/burning in bilateral LEs Coordination Gross Motor Movements are Fluid and Coordinated: No Fine Motor Movements are Fluid and Coordinated: No Coordination and Movement  Description: generalized weakness/deconditioning, fatigue, and decreased balance/coordination Finger Nose Finger Test: tremors bilaterally Heel Shin Test: decreased coordination bilaterally Motor  Motor Motor: Other (comment) Motor - Skilled Clinical Observations: global weakness/deconditioning and easily fatigued Mobility  Bed Mobility Bed Mobility: Rolling Right;Rolling Left;Sit to Supine;Supine to Sit Rolling Right: Supervision/verbal cueing Rolling Left: Supervision/Verbal cueing Supine to Sit: Supervision/Verbal cueing  Sit to Supine: Moderate Assistance - Patient 50-74% Transfers Sit to Stand: Minimal Assistance - Patient > 75% Stand to Sit: Supervision/Verbal cueing  Trunk/Postural Assessment  Cervical Assessment Cervical Assessment: Exceptions to [facility] (forward head) Thoracic Assessment Thoracic Assessment: Exceptions to [facility] (rounded shoulders with thoracic kyphosis) Lumbar Assessment Lumbar Assessment: Exceptions to [facility] (posterior pelvic tilt) Postural Control Postural Control: Deficits on evaluation (heavy reliance on BUE support on RW)  Balance Balance Balance Assessed: Yes Static Sitting Balance Static Sitting - Balance Support: Feet supported Static Sitting - Level of Assistance: 7: Independent Dynamic Sitting Balance Dynamic Sitting - Balance Support: Feet supported;No upper extremity supported Dynamic Sitting - Level of Assistance: 6: Modified independent (Device/Increase time) Static Standing Balance Static Standing - Balance Support: Bilateral upper extremity supported;During functional activity (RW) Static Standing - Level of Assistance: 5: Stand by assistance (supervision) Dynamic Standing Balance Dynamic Standing - Balance Support: During functional activity;Bilateral upper extremity supported (RW) Dynamic Standing - Level of Assistance: 5: Stand by assistance (CGA) Dynamic Standing - Comments: with gait Extremity/Trunk Assessment RUE Assessment RUE  Assessment: Exceptions to [facility] Passive Range of Motion (PROM) Comments: able to achieve approx. 90 degrees shoulder flexion Active Range of Motion (AROM) Comments: limited due to hx of rotator cuff tears General Strength Comments: 3/5 LUE Assessment LUE Assessment: Exceptions to [facility] Passive Range of Motion (PROM) Comments: able to achieve approx. 90 degrees shoulder flexion Active Range of Motion (AROM) Comments: limited due to hx of rotator cuff tear General Strength Comments: 3/5   Tinley Rought E Trinidy Masterson, MS, OTR/L  12/22/2022, 3:51 PM

## 2022-12-22 NOTE — Plan of Care (Signed)
  Problem: Sit to Stand Goal: LTG:  Patient will perform sit to stand in prep for activites of daily living with assistance level (OT) Description: LTG:  Patient will perform sit to stand in prep for activites of daily living with assistance level (OT) Outcome: Not Met (add Reason) Flowsheets (Taken 12/22/2022 1551) LTG: PT will perform sit to stand in prep for activites of daily living with assistance level: (Not met due to balance, endurance and coordination deficits requiring increased assist) --   Problem: RH Toileting Goal: LTG Patient will perform toileting task (3/3 steps) with assistance level (OT) Description: LTG: Patient will perform toileting task (3/3 steps) with assistance level (OT)  Outcome: Not Met (add Reason) Flowsheets (Taken 12/22/2022 1551) LTG: Pt will perform toileting task (3/3 steps) with assistance level: (Not met due to balance, endurance and coordination deficits requiring increased assist) --   Problem: RH Balance Goal: LTG Patient will maintain dynamic standing with ADLs (OT) Description: LTG:  Patient will maintain dynamic standing balance with assist during activities of daily living (OT)  Outcome: Completed/Met   Problem: RH Grooming Goal: LTG Patient will perform grooming w/assist,cues/equip (OT) Description: LTG: Patient will perform grooming with assist, with/without cues using equipment (OT) Outcome: Completed/Met   Problem: RH Bathing Goal: LTG Patient will bathe all body parts with assist levels (OT) Description: LTG: Patient will bathe all body parts with assist levels (OT) Outcome: Completed/Met   Problem: RH Dressing Goal: LTG Patient will perform upper body dressing (OT) Description: LTG Patient will perform upper body dressing with assist, with/without cues (OT). Outcome: Completed/Met Goal: LTG Patient will perform lower body dressing w/assist (OT) Description: LTG: Patient will perform lower body dressing with assist, with/without cues in  positioning using equipment (OT) Outcome: Completed/Met   Problem: RH Toilet Transfers Goal: LTG Patient will perform toilet transfers w/assist (OT) Description: LTG: Patient will perform toilet transfers with assist, with/without cues using equipment (OT) Outcome: Completed/Met

## 2022-12-22 NOTE — Discharge Summary (Signed)
Physician Discharge Summary  Patient ID: [name] [name] MRN: [id number] DOB/AGE: 1940/08/23 82 y.o.  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 Lungs: Low lung volumes with asymmetrically decreased left lung aeration. Pleura: Increased moderate left and trace right pleural effusions. Heart/mediastinum: Similar enlarged cardiomediastinal silhouette. Bones: Median sternotomy wires are nondisplaced. IMPRESSION: 1. Increased moderate left and trace right pleural effusions. 2. Low lung volumes with asymmetrically decreased left lung aeration. 3. Similar cardiomegaly. Electronically Signed   By: Agustin Cree M.D.   On: 12/22/2022 08:52   DG Chest 2 View  Result Date: 12/12/2022 CLINICAL DATA:  Confusion. EXAM: CHEST - 2 VIEW COMPARISON:  Chest radiographs 12/03/2022, 12/02/2022 MS 11/28/2022 FINDINGS: Interval removal of right upper extremity PICC. Status post median sternotomy. Cardiac silhouette is again moderately to markedly enlarged. Ascending, aortic arch, descending thoracic aortic stent graft again noted. Cardiac valve replacement again noted. Moderate left pleural effusion is similar to 12/03/2022 most recent prior. Stable to slightly decreased small right pleural effusion. Associated bibasilar homogeneous and heterogeneous opacities. Left upper hemithorax  surgical clips are again seen. No pneumothorax is seen. No acute skeletal abnormality. Bilateral high-riding humeral heads consistent with full-thickness superior rotator cuff tears. IMPRESSION: 1. Moderate left pleural effusion is similar to 12/03/2022 most recent prior. Stable to slightly decreased small right pleural effusion. 2. Associated bibasilar homogeneous and heterogeneous opacities. Electronically Signed   By: Neita Garnet M.D.   On: 12/12/2022 18:48   US THORACENTESIS ASP PLEURAL SPACE W/IMG GUIDE  Result Date: 12/03/2022 INDICATION: Patient with large left pleural effusion. Request received for diagnostic and therapeutic thoracentesis EXAM: ULTRASOUND GUIDED LEFT THORACENTESIS MEDICATIONS: 8 cc 1% lidocaine COMPLICATIONS: None immediate. PROCEDURE: An ultrasound guided thoracentesis was thoroughly discussed with the patient and questions answered. The benefits, risks, alternatives and complications were also discussed. The patient understands and wishes to proceed with the procedure. Written consent was obtained. Ultrasound was performed to localize and mark an adequate pocket of fluid in the left chest. The area was then prepped and draped in the normal sterile fashion. 1% Lidocaine was used for local anesthesia. Under ultrasound guidance a 6 Fr Safe-T-Centesis catheter was introduced. Thoracentesis was performed. The catheter was removed and a dressing applied. FINDINGS: A total of approximately 700 mL of dark yellow fluid was removed. Samples were sent to the laboratory as requested by the clinical team. IMPRESSION: Successful ultrasound guided left thoracentesis yielding 700 mL of pleural fluid. Follow-up chest x-ray revealed no evidence of pneumothorax. Procedure performed by Mina Marble, PA-C Electronically Signed   By: Corlis Leak M.D.   On: 12/03/2022 12:40   DG Chest 1 View  Result Date: 12/03/2022 CLINICAL DATA:  Left thoracentesis EXAM: CHEST  1 VIEW COMPARISON:  12/02/2022  FINDINGS: Stable cardiomegaly status post sternotomy, cardiac valve replacement, and aortic stent graft. Moderate bilateral pleural effusions, decreased on the left. Persistent airspace opacities bilaterally. No pneumothorax. Right-sided  PICC line remains in place. IMPRESSION: Moderate bilateral pleural effusions, decreased on the left status post thoracentesis. No pneumothorax. Electronically Signed   By: Duanne Guess D.O.   On: 12/03/2022 10:17   VAS [place] ABI WITH/WO TBI  Result Date: 12/02/2022  LOWER EXTREMITY DOPPLER STUDY Patient Name:  [name] [name]  Date of Exam:   11/30/2022 Medical Rec #: [id number]          Accession #:    [id number] Date of Birth: April 26, 1941          Patient Gender: M Patient Age:   86 years Exam Location:  [facility] Procedure:      VAS [place] ABI WITH/WO TBI Referring Phys: MARCUS DUDA --------------------------------------------------------------------------------  Indications: Rest pain, ulceration, and peripheral artery disease. High Risk Factors: Hypertension, hyperlipidemia, coronary artery disease. Other Factors: Aortic valve replacement replacement of aortic root and ascending                aorta and replacement of aortic arch to the left subclavian                artery. 2010.  Comparison Study: No prior study on file Performing Technologist: Sherren Kerns RVS  Examination Guidelines: A complete evaluation includes at minimum, Doppler waveform signals and systolic blood pressure reading at the level of bilateral brachial, anterior tibial, and posterior tibial arteries, when vessel segments are accessible. Bilateral testing is considered an integral part of a complete examination. Photoelectric Plethysmograph (PPG) waveforms and toe systolic pressure readings are included as required and additional duplex testing as needed. Limited examinations for reoccurring indications may be performed as noted.  ABI Findings:  +---------+------------------+-----+-------------------+---------+ Right    Rt Pressure (mmHg)IndexWaveform           Comment   +---------+------------------+-----+-------------------+---------+ Brachial                        multiphasic        PICC line +---------+------------------+-----+-------------------+---------+ PTA      55                0.54 dampened monophasic          +---------+------------------+-----+-------------------+---------+ DP       48                0.48 dampened monophasic          +---------+------------------+-----+-------------------+---------+ Great Toe0                 0.00 Absent                       +---------+------------------+-----+-------------------+---------+ +---------+------------------+-----+-------------------+-------+ Left     Lt Pressure (mmHg)IndexWaveform           Comment +---------+------------------+-----+-------------------+-------+ Brachial 101                    multiphasic                +---------+------------------+-----+-------------------+-------+ PTA      58                0.57 dampened monophasic        +---------+------------------+-----+-------------------+-------+ DP       0                 0.00 absent                     +---------+------------------+-----+-------------------+-------+ Oda Cogan  0.00 Absent                     +---------+------------------+-----+-------------------+-------+ +-------+-----------+-----------+------------+------------+ ABI/TBIToday's ABIToday's TBIPrevious ABIPrevious TBI +-------+-----------+-----------+------------+------------+ Right  0.54       absent                              +-------+-----------+-----------+------------+------------+ Left   0.57       absent                              +-------+-----------+-----------+------------+------------+  Summary: Right: Resting right ankle-brachial index indicates moderate right  lower extremity arterial disease. The right toe-brachial index is abnormal. Left: Resting left ankle-brachial index indicates moderate left lower extremity arterial disease. The left toe-brachial index is abnormal. *See table(s) above for measurements and observations.  Suggest Peripheral Vascular Consult. Electronically signed by Heath Lark on 12/02/2022 at 5:43:34 PM.    Final    DG CHEST PORT 1 VIEW  Result Date: 12/02/2022 CLINICAL DATA:  142230 Pleural effusion 142230 EXAM: PORTABLE CHEST 1 VIEW COMPARISON:  November 28, 2022 FINDINGS: The cardiomediastinal silhouette is unchanged in contour.Status post median sternotomy head with stent graft placement throughout the aorta. RIGHT upper extremity PICC tip terminates over the superior cavoatrial junction. Large LEFT pleural effusion, similar in comparison to most recent prior. Small RIGHT pleural effusion. No pneumothorax. Diffuse bilateral airspace opacities, with mildly increased confluence in the RIGHT lateral lower lung base in comparison to prior. Similar confluent opacity of the RIGHT lateral apex. IMPRESSION: 1. Similar appearance of large LEFT and small RIGHT pleural effusions. 2. Diffuse bilateral airspace opacities, with mildly increased confluence in the RIGHT lateral lower lung base in comparison to prior. Electronically Signed   By: Meda Klinefelter M.D.   On: 12/02/2022 13:16   ECHOCARDIOGRAM COMPLETE  Result Date: 12/01/2022    ECHOCARDIOGRAM REPORT   Patient Name:   [name] [name] Date of Exam: 12/01/2022 Medical Rec #:  [id number]         Height:       70.5 in Accession #:    [id number]        Weight:       190.9 lb Date of Birth:  1941-04-28         BSA:          2.057 m Patient Age:    82 years          BP:           102/68 mmHg Patient Gender: M                 HR:           85 bpm. Exam Location:  Inpatient Procedure: 2D Echo, Color Doppler and Cardiac Doppler Indications:    I50.21 Acute systolic (congestive) heart failure  History:         Patient has prior history of Echocardiogram examinations, most                 recent 11/02/2022. CHF, CAD, Arrythmias:Atrial Fibrillation and                 Atrial Flutter; Risk Factors:Hypertension. 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  Aortic Valve: unknown bioprosthetic valve is present in the                 aortic position. Procedure Date: 2010.  Sonographer:    Irving Burton Senior RDCS Referring Phys: [id number] Corrin Parker  Sonographer Comments: Scanned upright on BiPap IMPRESSIONS  1. Left ventricular ejection fraction, by estimation, is 35 to 40%. The left ventricle has moderately decreased function. The left ventricle demonstrates global hypokinesis. There is mild asymmetric left ventricular hypertrophy of the septal segment. Left ventricular diastolic parameters are consistent with Grade III diastolic dysfunction (restrictive).  2. Right ventricular systolic function is moderately reduced. The right ventricular size is moderately enlarged. There is moderately elevated pulmonary artery systolic pressure. The estimated right ventricular systolic pressure is 56.5 mmHg.  3. Left atrial size was severely dilated.  4. Right atrial size was severely dilated.  5. The mitral valve is abnormal. Moderate mitral valve regurgitation. Moderate mitral annular calcification.  6. The tricuspid valve is abnormal. Tricuspid valve regurgitation is moderate.  7. 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 The aortic valve has been repaired/replaced. Aortic valve regurgitation is not visualized. There is a unknown bioprosthetic valve present in the aortic position. Procedure Date: 2010. Echo findings are consistent with normal structure and function of the aortic valve prosthesis. Aortic valve mean gradient measures 9.5 mmHg.  8. The pulmonic valve was abnormal.  9. The inferior vena cava is dilated in size with <50% respiratory variability, suggesting right atrial  pressure of 15 mmHg. FINDINGS  Left Ventricle: Left ventricular ejection fraction, by estimation, is 35 to 40%. The left ventricle has moderately decreased function. The left ventricle demonstrates global hypokinesis. The left ventricular internal cavity size was normal in size. There is mild asymmetric left ventricular hypertrophy of the septal segment. Left ventricular diastolic parameters are consistent with Grade III diastolic dysfunction (restrictive). Right Ventricle: The right ventricular size is moderately enlarged. No increase in right ventricular wall thickness. Right ventricular systolic function is moderately reduced. There is moderately elevated pulmonary artery systolic pressure. The tricuspid  regurgitant velocity is 3.22 m/s, and with an assumed right atrial pressure of 15 mmHg, the estimated right ventricular systolic pressure is 56.5 mmHg. Left Atrium: Left atrial size was severely dilated. Right Atrium: Right atrial size was severely dilated. Pericardium: There is no evidence of pericardial effusion. Mitral Valve: The mitral valve is abnormal. There is mild calcification of the mitral valve leaflet(s). Moderate mitral annular calcification. Moderate mitral valve regurgitation. MV peak gradient, 105.3 mmHg. Tricuspid Valve: The tricuspid valve is abnormal. Tricuspid valve regurgitation is moderate. Aortic Valve: Post AVR valve type unknown no PVL and normal gradients. The aortic valve has been repaired/replaced. Aortic valve regurgitation is not visualized. Aortic valve mean gradient measures 9.5 mmHg. Aortic valve peak gradient measures 16.1 mmHg.  Aortic valve area, by VTI measures 2.07 cm. There is a unknown bioprosthetic valve present in the aortic position. Procedure Date: 2010. Echo findings are consistent with normal structure and function of the aortic valve prosthesis. Pulmonic Valve: The pulmonic valve was abnormal. Pulmonic valve regurgitation is mild to moderate. Aorta: The aortic root  and ascending aorta are structurally normal, with no evidence of dilitation. Venous: The inferior vena cava is dilated in size with less than 50% respiratory variability, suggesting right atrial pressure of 15 mmHg. IAS/Shunts: No atrial level shunt detected by color flow Doppler.  LEFT VENTRICLE PLAX 2D LVIDd:         5.90 cm  LVIDs:         4.90 cm LV PW:         1.00 cm LV IVS:        1.20 cm LVOT diam:     2.20 cm LV SV:         82 LV SV Index:   40 LVOT Area:     3.80 cm  LV Volumes (MOD) LV vol d, MOD A2C: 140.0 ml LV vol d, MOD A4C: 134.0 ml LV vol s, MOD A2C: 78.1 ml LV vol s, MOD A4C: 88.0 ml LV SV MOD A2C:     61.9 ml LV SV MOD A4C:     134.0 ml LV SV MOD BP:      54.2 ml RIGHT VENTRICLE RV S prime:     5.66 cm/s TAPSE (M-mode): 1.1 cm LEFT ATRIUM              Index        RIGHT ATRIUM           Index LA diam:        5.70 cm  2.77 cm/m   RA Area:     43.40 cm LA Vol (A2C):   135.0 ml 65.62 ml/m  RA Volume:   191.00 ml 92.84 ml/m LA Vol (A4C):   142.0 ml 69.02 ml/m LA Biplane Vol: 138.0 ml 67.08 ml/m  AORTIC VALVE AV Area (Vmax):    2.12 cm AV Area (Vmean):   2.27 cm AV Area (VTI):     2.07 cm AV Vmax:           200.50 cm/s AV Vmean:          147.000 cm/s AV VTI:            0.396 m AV Peak Grad:      16.1 mmHg AV Mean Grad:      9.5 mmHg LVOT Vmax:         112.00 cm/s LVOT Vmean:        87.700 cm/s LVOT VTI:          0.216 m LVOT/AV VTI ratio: 0.55  AORTA Ao Root diam: 3.20 cm Ao Asc diam:  3.70 cm MITRAL VALVE                  TRICUSPID VALVE MV Area (PHT): 4.31 cm       TR Peak grad:   41.5 mmHg MV Peak grad:  105.3 mmHg     TR Vmax:        322.00 cm/s MV Vmax:       5.13 m/s MR Peak grad:    112.8 mmHg   SHUNTS MR Mean grad:    72.0 mmHg    Systemic VTI:  0.22 m MR Vmax:         531.00 cm/s  Systemic Diam: 2.20 cm MR Vmean:        406.0 cm/s MR PISA:         2.26 cm MR PISA Eff ROA: 16 mm MR PISA Radius:  0.60 cm MV E velocity: 104.00 cm/s Charlton Haws MD Electronically signed by Charlton Haws  MD Signature Date/Time: 12/01/2022/10:04:19 AM    Final    US RENAL  Result Date: 11/30/2022 CLINICAL DATA:  Acute kidney injury EXAM: RENAL / URINARY TRACT ULTRASOUND COMPLETE COMPARISON:  11/28/2022 FINDINGS: Right Kidney: Renal measurements: 10.4 x 5.2 x 4.6 cm = volume: 131 mL. Echogenicity within normal limits. No mass or  hydronephrosis visualized. Left Kidney: Renal measurements: 11.1 x 4.9 x 4.1 cm = volume: 116 mL. Echogenicity within normal limits. No mass or hydronephrosis visualized. Bladder: Appears normal for degree of bladder distention. Other: Right pleural effusion partially visualized. IMPRESSION: No acute findings.  No hydronephrosis. Right pleural effusion. Electronically Signed   By: Charlett Nose M.D.   On: 11/30/2022 22:19   CT CHEST WO CONTRAST  Result Date: 11/29/2022 CLINICAL DATA:  Pneumonia.  Left thoracentesis 11/28/2022. EXAM: CT CHEST WITHOUT CONTRAST TECHNIQUE: Multidetector CT imaging of the chest was performed following the standard protocol without IV contrast. RADIATION DOSE REDUCTION: This exam was performed according to the departmental dose-optimization program which includes automated exposure control, adjustment of the mA and/or kV according to patient size and/or use of iterative reconstruction technique. COMPARISON:  11/27/2022. FINDINGS: Cardiovascular: Status post aortic valve replacement and tube graft repair of the ascending aorta as well as left carotid-subclavian bypass grafting. Endovascular stent grafting of the aortic arch and descending thoracic aorta is again noted with the proximal portion of the stent graft covering the left subclavian artery origin. 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 Extensive multi-vessel coronary artery calcification. Stable cardiomegaly. No pericardial effusion. Central pulmonary arteries  are enlarged in keeping with changes of pulmonary arterial hypertension. Right upper extremity PICC line tip is seen at the superior cavoatrial junction. Mediastinum/Nodes: Visualized thyroid is unremarkable. Pathologic mediastinal adenopathy appears slightly progressive with the index lymph node measuring 19 mm in short axis diameter within the precarinal lymph node groups axial image # 56/3. The esophagus is unremarkable. Lungs/Pleura: Focal consolidation within the a right apex and scattered nodular infiltrate throughout the right upper and, to a lesser extent, left upper and right middle lobes are again identified in keeping with multifocal infection in the acute setting. Punctate areas of cavitation are identified within the right apex. Superimposed mild emphysema. Small left pleural effusion appears partially loculated within the sub pulmonic region and appears decreased in size since prior examination but not completely evacuated. There is left-sided volume loss with subtotal collapse of the lower lobe and segmental atelectasis of the lingula. Moderate dependently layering right pleural effusion is present. Compressive atelectasis of the dependent right lower lobe noted. No pneumothorax. No central obstructing lesion. Bronchial wall thickening noted in keeping with airway inflammation. Upper Abdomen: No acute abnormality. Musculoskeletal: No acute bone abnormality. No lytic or blastic bone lesion. IMPRESSION: 1. Status post aortic valve replacement and tube graft repair of the ascending aorta as well as left carotid-subclavian bypass grafting. Endovascular stent grafting of the aortic arch and descending thoracic aorta is again noted with the proximal portion of the stent graft covering the left subclavian artery origin. 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 2. Extensive multi-vessel coronary artery calcification. Stable cardiomegaly. 3. Morphologic  changes in keeping with pulmonary arterial hypertension. 4. Multifocal pulmonary consolidation and nodular infiltrate in keeping with multifocal necrotizing infection. Findings appears stable since prior examination. 5. Moderate dependently layering right pleural effusion. 6. Small left pleural effusion appears partially loculated within the sub pulmonic region and appears decreased in size since prior examination but not completely evacuated. Associated left-sided volume loss with subtotal collapse of the lower lobe and segmental atelectasis of the lingula. 7. Pathologic mediastinal adenopathy, slightly progressive since prior examination, likely reactive. 8.  Mild emphysema. Aortic Atherosclerosis (ICD10-I70.0) and Emphysema (ICD10-J43.9). Electronically Signed   By: Helyn Numbers M.D.   On: 11/29/2022 02:05   DG CHEST PORT 1 VIEW  Result Date: 11/28/2022 CLINICAL DATA:  PICC verification. EXAM: PORTABLE CHEST 1 VIEW COMPARISON:  Radiograph earlier today, CT yesterday FINDINGS: Tip of the right upper extremity PICC projects over the lower SVC. Unchanged heart size and mediastinal contours, aortic stent graft. Left pleural effusion may have diminished slightly from earlier today. Associated compressive atelectasis. Patchy airspace disease in the left mid lung. Right pleural effusion may have increased. Peripheral opacity in the right upper lung zone. No pneumothorax. IMPRESSION: 1. Tip of the right upper extremity PICC projects over the lower SVC. 2. Shifting pleural effusions may be due to differences in positioning. 3. Bilateral airspace disease. Electronically Signed   By: Narda Rutherford M.D.   On: 11/28/2022 20:44    Labs:  Basic Metabolic Panel: Recent Labs  Lab 12/16/22 0532 12/17/22 2130 12/18/22 0618 12/19/22 0942 12/20/22 0535 12/21/22 0620 12/22/22 0620  NA 145 145 144 144 146* 143 143  K 3.8 4.7 4.5 4.9 4.3 4.4 4.4  CL 107 107 106 106 104 105 106  CO2 28 30 26 26 28 24 23   GLUCOSE  91 106* 96 116* 94 88 84  BUN 40* 42* 50* 52* 53* 57* 64*  CREATININE 1.65* 1.74* 1.85* 1.90* 1.87* 2.03* 2.08*  CALCIUM 8.6* 8.7* 8.6* 9.0 8.9 8.7* 8.6*  MG 2.0 2.2  --   --   --   --   --     CBC: Recent Labs  Lab 12/20/22 0535 12/21/22 0620 12/22/22 0620  WBC 4.4 4.8 5.2  NEUTROABS 3.5 3.7 4.1  HGB 6.7* 7.6* 7.7*  HCT 22.9* 25.8* 25.1*  MCV 98.3 96.3 97.3  PLT 73* 71* 64*    CBG: No results for input(s): "GLUCAP" in the last 168 hours.  Brief HPI:   [name] [name] is a 82 y.o. male ***   Hospital Course: [name] [name] was admitted to rehab 12/08/2022 for inpatient therapies to consist of PT, ST and OT at least three hours five days a week. Past admission physiatrist, therapy team and rehab RN have worked together to provide customized collaborative inpatient rehab.   Blood pressures were monitored on TID basis and   Diabetes has been monitored with ac/hs CBG checks and SSI was use prn for tighter BS control.    Rehab course: During patient's stay in rehab weekly team conferences were held to monitor patient's progress, set goals and discuss barriers to discharge. At admission, patient required  He/She  has had improvement in activity tolerance, balance, postural control as well as ability to compensate for deficits. He/She has had improvement in functional use RUE/LUE  and RLE/LLE as well as improvement in awareness       Disposition:  There are no questions and answers to display.         Diet:  Special Instructions:  Discharge Instructions     Ambulatory referral to Infectious Disease   Complete by: As directed    Follow up on MRSA PNA/pleural effusion   Ambulatory referral to Physical Medicine Rehab   Complete by: As directed       Allergies as of 12/22/2022       Reactions   Lortab [hydrocodone-acetaminophen] Hives   No trouble breathing   Morphine And Codeine Hives   Other Other (See Comments)   Staples from surgery caused infection  Medication List     STOP taking these medications    furosemide 40 MG tablet Commonly known as: LASIX   linezolid 600 MG tablet Commonly known as: ZYVOX   metoprolol succinate 25 MG 24 hr tablet Commonly known as: TOPROL-XL   polyethylene glycol 17 g packet Commonly known as: MIRALAX / GLYCOLAX   ZINC PO       TAKE these medications    acetaminophen 500 MG tablet Commonly known as: TYLENOL Take 1 tablet (500 mg total) by mouth every 6 (six) hours as needed. What changed: how much to take   albuterol (2.5 MG/3ML) 0.083% nebulizer solution Commonly known as: PROVENTIL Take by nebulization.   amiodarone 200 MG tablet Commonly known as: PACERONE Take 1 tablet (200 mg total) by mouth daily.   apixaban 2.5 MG Tabs tablet Commonly known as: ELIQUIS Take 1 tablet (2.5 mg total) by mouth 2 (two) times daily.   ascorbic acid 500 MG tablet Commonly known as: VITAMIN C Take 1 tablet (500 mg total) by mouth daily.   aspirin 81 MG chewable tablet Chew 1 tablet (81 mg total) by mouth daily.   doxycycline 100 MG tablet Commonly known as: VIBRA-TABS Take 1 tablet (100 mg total) by mouth every 12 (twelve) hours.   feeding supplement Liqd Take 237 mLs by mouth 3 (three) times daily between meals.   ferrous sulfate 325 (65 FE) MG tablet Commonly known as: SV Iron Take 1 tablet (325 mg total) by mouth daily with breakfast. Notes to patient: Over the counter   levocetirizine 5 MG tablet Commonly known as: XYZAL Take 5 mg by mouth daily.   melatonin 5 MG Tabs Take 1 tablet (5 mg total) by mouth at bedtime as needed.   midodrine 10 MG tablet Commonly known as: PROAMATINE Take 1 tablet (10 mg total) by mouth 3 (three) times daily with meals.   multivitamin with minerals Tabs tablet Take 1 tablet by mouth daily.   pantoprazole 40 MG tablet Commonly known as: PROTONIX Take 1 tablet (40 mg total) by mouth 2 (two) times daily. What changed: when to take  this   saccharomyces boulardii 250 MG capsule Commonly known as: FLORASTOR Take 1 capsule (250 mg total) by mouth 2 (two) times daily. Notes to patient: Over the counter   senna 8.6 MG Tabs tablet Commonly known as: SENOKOT Take 1 tablet (8.6 mg total) by mouth 2 (two) times daily. Notes to patient: Over the counter   tamsulosin 0.4 MG Caps capsule Commonly known as: FLOMAX Take 0.4 mg by mouth.   traMADol 50 MG tablet Commonly known as: ULTRAM Take 50 mg by mouth every 6 (six) hours as needed for moderate pain.   Vitamin D (Ergocalciferol) 1.25 MG (50000 UNIT) Caps capsule Commonly known as: DRISDOL Take 1 capsule (50,000 Units total) by mouth every 7 (seven) days. Start taking on: December 24, 2022        Follow-up Information     Street, [name], MD. Call.   Specialty: Family Medicine Why: On MONDAY- 12/25/22--for post hospital follow up appointment. He needs CXR repeated to follow up on effusion and check BMET to monitor for improvement of renal function. Contact information: [address] [place] [address] [phone]         Horton Chin, MD Follow up.   Specialty: Physical Medicine and Rehabilitation Why: office will call you with follow up appointment Contact information: [address] [place] [address] [phone]  Nadara Mustard, MD. Call.   Specialty: Orthopedic Surgery Why: As needed Contact information: [address] [place] [address] [phone]         Judyann Munson, MD Follow up.   Specialty: Infectious Diseases Contact information: [address] [place] [address] [phone]         [name], MD. Call.   Specialties: Cardiology, Radiology, Interventional Cardiology Why: for routine check Contact information: [address] [place] [address] [phone]         Lorin Glass, MD. Call.   Specialty: Pulmonary  Disease Why: As needed Contact information: [address] [place] [address] [phone]                 Signed: Jacquelynn Cree 12/22/2022, 3:14 PM

## 2022-12-22 NOTE — Progress Notes (Addendum)
PROGRESS NOTE   Subjective/Complaints: Patient was seen by GI service, endoscopy offered however patient and wife declined.  Recommended IV iron and PPI twice daily.  Chest x-ray with increased moderate left and trace right pleural effusions.  Pulmonary contacted for pleural effusion.  No thoracentesis recommended at this time, follow-up x-ray with PCP or if it worsens can make appointment in office with pulmonary for further evaluation.   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 Lungs: Low lung volumes with asymmetrically decreased left lung aeration. Pleura: Increased moderate left and trace right pleural effusions. Heart/mediastinum: Similar enlarged cardiomediastinal silhouette. Bones: Median sternotomy wires are nondisplaced. IMPRESSION: 1. Increased moderate left and trace right pleural effusions. 2. Low lung volumes with asymmetrically decreased left lung aeration. 3. Similar cardiomegaly. Electronically Signed   By: Agustin Cree M.D.   On: 12/22/2022 08:52   Recent Labs    12/21/22 0620 12/22/22 0620  WBC 4.8 5.2  HGB 7.6* 7.7*  HCT 25.8* 25.1*  PLT 71* 64*     Recent Labs    12/21/22 0620 12/22/22 0620  NA 143 143  K 4.4 4.4  CL 105 106  CO2 24 23  GLUCOSE 88 84  BUN 57* 64*  CREATININE 2.03* 2.08*  CALCIUM 8.7* 8.6*     Intake/Output Summary (Last 24 hours) at 12/22/2022 1259 Last data filed at 12/22/2022 0415 Gross per 24 hour  Intake 360 ml  Output 325 ml  Net 35 ml      Pressure Injury 11/29/22 Heel Right Stage 3 -  Full thickness tissue loss. Subcutaneous fat may be visible but bone, tendon or muscle are NOT  exposed. (Active)  11/29/22 1046  Location: Heel  Location Orientation: Right  Staging: Stage 3 -  Full thickness tissue loss. Subcutaneous fat may be visible but bone, tendon or muscle are NOT exposed.  Wound Description (Comments):   Present on Admission: Yes     Pressure Injury 12/08/22 Sacrum Deep Tissue Pressure Injury - Purple or maroon localized area of discolored intact skin or blood-filled blister due to damage of underlying soft tissue from pressure and/or shear. large area purple area extending to the co (Active)  12/08/22 1910  Location: Sacrum  Location Orientation:   Staging: Deep Tissue Pressure Injury - Purple or maroon localized area of discolored intact skin or blood-filled blister due to damage of underlying soft tissue from pressure and/or shear.  Wound Description (Comments): large area purple area extending to the coccyx & bilateral upper buttocks, with open areas. Charted by WOC as DTI in evolution on 6/18  Present on Admission: Yes    Physical Exam: Vital Signs Blood pressure 112/62, pulse 90, temperature 97.8 F (36.6 C), temperature source Oral, resp. rate 16, height 5\' 10"  (1.778 m), weight 117 kg, SpO2 94 %. General: awake, alert, NAD HENT: conjugate gaze; oropharynx moist, hard of hearing CV: regular  rate; no JVD, hypotensive Pulmonary: - Decreased breath bases GI: soft, NT, ND, (+)BS Psychiatric: calm Neurological: alert, but not sure why here Skin: Multiple wounds + Sacral PI, Stage 4 + R heel PI, unstageable + R 2nd toe extruded bone; no apparent drainage + L second toe wound with clean base, bloody drainage + B/l shin wounds, healing                                 MSK:      No apparent deformity. BL UE shoulder abduction limited to <90 degrees d/t bilateral rotator cuff tears.      Strength:                RUE: 3/5 SA, 3/5 EF, 3/5 EE, 4/5 WE, 4/5 FF, 4/5 FA                 LUE: 3/5 SA, 3/5 EF, 3/5 EE, 4/5 WE, 4/5 FF, 4/5 FA                  RLE: 2/5 HF, 4/5 KE, 4/5 DF, 4/5 PF                 LLE:  2/5 HF, 4/5 KE, 4/5 DF, 4/5 PF    Neurologic exam:  Cognition: AAO to person, place, time and event.  Language: Fluent, No substitutions or neoglisms. +Hypophonic, breathy vocalizations Memory: Recalls 2/3 objects at 5 minutes.  Insight: Poor insight into current condition.  Mood: Flat affect, appropriate mood.  Sensation: To light touch reduced in bilateral LEs Reflexes: 2+ in BL UE and Les  CN: 2-12 grossly intact.  Coordination: R>L UE intention tremors Spasticity: MAS 0 in all extremities.    Assessment/Plan: 1. Functional deficits which require 3+ hours per day of interdisciplinary therapy in a comprehensive inpatient rehab setting. Physiatrist is providing close team supervision and 24 hour management of active medical problems listed below. Physiatrist and rehab team continue to assess barriers to discharge/monitor patient progress toward functional and medical goals  Care Tool:  Bathing    Body parts bathed by patient: Right arm, Left arm, Chest, Abdomen, Front perineal area, Right upper leg, Left upper leg, Face   Body parts bathed by helper: Buttocks, Right lower leg, Left lower leg     Bathing assist Assist Level: Minimal Assistance - Patient > 75%     Upper Body Dressing/Undressing Upper body dressing   What is the patient wearing?: Pull over shirt    Upper body assist Assist Level: Minimal Assistance - Patient > 75%    Lower Body Dressing/Undressing Lower body dressing      What is the patient wearing?: Incontinence brief, Pants     Lower body assist Assist for lower body dressing: Minimal Assistance - Patient > 75%     Toileting Toileting Toileting Activity did not occur (Clothing management and hygiene only): N/A (no void or bm)  Toileting assist Assist for toileting: Minimal Assistance - Patient > 75%     Transfers Chair/bed transfer  Transfers assist     Chair/bed  transfer assist level: Supervision/Verbal cueing Chair/bed transfer assistive device: Arboriculturist assist      Assist level: Contact Guard/Touching assist Assistive device: Walker-rolling Max distance: 18ft   Walk 10 feet activity   Assist     Assist level: Contact Guard/Touching assist Assistive device: Walker-rolling   Walk 50  feet activity   Assist Walk 50 feet with 2 turns activity did not occur: Safety/medical concerns  Assist level: Contact Guard/Touching assist Assistive device: Walker-rolling    Walk 150 feet activity   Assist Walk 150 feet activity did not occur: Safety/medical concerns         Walk 10 feet on uneven surface  activity   Assist Walk 10 feet on uneven surfaces activity did not occur: Safety/medical concerns         Wheelchair     Assist Is the patient using a wheelchair?: Yes Type of Wheelchair: Manual    Wheelchair assist level: Dependent - Patient 0%      Wheelchair 50 feet with 2 turns activity    Assist        Assist Level: Dependent - Patient 0%   Wheelchair 150 feet activity     Assist      Assist Level: Dependent - Patient 0%   Blood pressure 112/62, pulse 90, temperature 97.8 F (36.6 C), temperature source Oral, resp. rate 16, height 5\' 10"  (1.778 m), weight 117 kg, SpO2 94 %.  Medical Problem List and Plan: 1. Functional deficits secondary to debility from loculated pleural effusion and MRSA pneumonia             -patient may shower             -ELOS/Goals: 7 to 10 days, PT/OT SPV   Continue CIR  -7/5 wife and patient adamant he should go home tomorrow, will continue with plan  2.  Atrial fibrillation: continue Eliquis             -antiplatelet therapy: Aspirin    3. B//l foot pain: continue tylenol  4. Multiple wounds             -local care to sacrum, bilateral toes (Dr. Lajoyce Corners)  -discussed that Dr. Lajoyce Corners does not recommend amputation due to poor  vascular status             -continue pressure relief/Prevalon boots/mattress             - Added multivitamin with minerals and ascorbic acid 500 mg daily for wound healing on admission    7. Fluids/Electrolytes/Nutrition/Malnutrition: Routine Is and Os and follow-up chemistries             - Poor POS, no appetite; Ensure TID, consider Marinol    8: Hypotension: monitor TID and prn             -continue midodrine    6/30- BP soft, but no hypotension per nursing/not dizzy 9: Hyperlipidemia: continue statin   10: Urinary retention: continue flomax since still having urgency   11: combined chronic systolic/diastolic heart failure: daily weight             -continue Pacerone 200 mg daily             -on no diuretics   6/24- weight bumped up dramatically today- from 115 to 119- will recheck in AM before giving diuretics- will verify, since off due to CKD/AKI  6/29- Weight was 119 kg yesterday but 116 kg today- more his baseline per review of weights in last 8 days.   6/30- weight down more due to Lasix- down to 113 kg- however Cr up to 1.7 and BUN up to 42- will recheck CMP in AM and BNP and allow team to decide plan  -7/5 wt stable today  Filed Weights   12/20/22 0500 12/21/22 0500  12/22/22 0500  Weight: 116.4 kg 117.2 kg 117 kg    12: chronic atrial fibrillation: rate controlled             -on Eliquis, Pacerone   6/24- pt told me this AM- never had Afib/irregular heart rate- did research to find he does have it- and pt poor historian 13: Pneumonia with effusion/MRSA sepsis:             -continue Zyvox for 4 weeks             -O2 via Ryan at 2 L             -BiPAP as needed/consult respiratory therapy  6/24- will get PICC line out since on oral ABX. Breathing "good" per pt- denies SOB   14: s/p AVR, stable thoracic aortic aneurysm: follow-up outpatient   15: AKI atop CKD IIIb: (baseline 1.2)             -slowing improving, encourage adequate hydration   Daily BMP  -Cr reviewed  and worsening, consulted nephrology and lasix held until goal creatinine of 1.5-1.6  7/5 BUNs/creatinine's slightly higher today at 64/2.08 extension of length of stay to continue monitor renal status was considered, patient wife adamant to go home today, follow-up with PCP for continued monitoring  16: Chronic anemia/iron deficiency: f/u CBC on Mondays. Iron supplement started   Hgb reviewed and decreased to 6.7 on 7.3, 1 U PRBC ordered  2nd stool occult ordered and has returned positive, will order CT abdomen  -7/5 Hold IV Iron due to recent infection, continue oral iron  17: Hard of hearing: wife to bring in hearing aids  18. Suboptimal vitamin D: continue ergocalciferol 50,000U one per week for 7 weeks  19. Loose stool: florastor added  20. Constipation: miralax d/ced, reviewed chart- last BM 6/24  6/29- LBM this AM  21. 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 UTI: start keflex, discussed 7 day course with wife  65. Confusion: likely 2/2 UTI, improved with Keflex, discussed that patient will complete 7 day course, discussed with wife that confusion has not recurred  24. Pleural effusion: d/c lasix. CXR ordered to monitor pleural effusion  -7/5 seen by pulmonary, appreciate assistance, follow-up with PCP for repeat chest x-ray, no thoracentesis recommended at this time  25. Hypotension: transition lasix to oral, critical care note reviewed, d/c lasix, continue midodrine 10mg  TID    LOS: 14 days A FACE TO FACE EVALUATION WAS PERFORMED  Fanny Dance 12/22/2022, 12:59 PM

## 2022-12-22 NOTE — Progress Notes (Addendum)
Patient ID: [name] [name], male   DOB: [birth year], 82 y.o.   MRN: [id number]  Sw informed spouse would like to have wheelchair and cushion picked up by  [employer]. Spouse expressed she no longer would like the items. Request sent to [employer].

## 2022-12-22 NOTE — Progress Notes (Signed)
                                                  Pulmonary Critical Care  Received call from rehab center concerning left pleural effusion.  He is asymptomatic at this time.  He is ready for discharge home.  Spoke with Dr. Craige Cotta and he reevaluated the chest x-ray and stated that no thoracentesis was needed at this time.  Suggest to get a chest x-ray follow-up with primary care physician or if he should worsen they can make an appointment in the office for further follow-up and possible thoracentesis in the future.  Brett Canales Zyra Parrillo ACNP Acute Care Nurse Practitioner [facility] Pul[facility]/Critical Care Please consult Amion 12/22/2022, 11:08 AM

## 2022-12-22 NOTE — Progress Notes (Signed)
Patient ID: [name] [name], male   DOB: [birth year], 82 y.o.   MRN: [id number]  Spouse reported she has provided [employer] with updated Medicare information to process DME.

## 2022-12-22 NOTE — Progress Notes (Signed)
[facility]  Name: [name] [name] MRN: [id number] DOB: 10/13/1940  CC: short of breath  Summary:  Subjective: Breathing comfortable.  Not having cough or chest congestion.  No chest pain.  Not needing supplemental oxygen.  Able to keep up with PT.  Vital signs: BP 118/66 (BP Location: Right Arm)   Pulse 87   Temp 98 F (36.7 C) (Oral)   Resp 16   Ht 5\' 10"  (1.778 m)   Wt 117 kg   SpO2 95%   BMI 37.01 kg/m   Physical exam:  General - alert Eyes - pupils reactive ENT - no sinus tenderness, no stridor Cardiac - regular rate/rhythm, no murmur Chest - decreased BS on Lt Abdomen - soft, non tender, + bowel sounds Extremities - no cyanosis, clubbing, or edema Skin - no rashes Neuro - follows commands Psych - normal mood and behavior  Assessment/plan:  Recurrent transudate Lt pleural effusion. - defer repeat thoracentesis for when he has more respiratory symptoms - continue lasix as able while monitoring renal fx - he can follow up with his PCP as outpt to repeat chest xray as needed  Updated his wife at bedside  Please call if additional assistance needed while he is in hospital  Coralyn Helling, MD [facility] Pulmonary/Critical Care Pager - [phone] or [phone] 12/22/2022, 3:29 PM

## 2022-12-23 LAB — BASIC METABOLIC PANEL
Anion gap: 17 — ABNORMAL HIGH (ref 5–15)
BUN: 68 mg/dL — ABNORMAL HIGH (ref 8–23)
CO2: 24 mmol/L (ref 22–32)
Calcium: 9 mg/dL (ref 8.9–10.3)
Chloride: 106 mmol/L (ref 98–111)
Creatinine, Ser: 2.04 mg/dL — ABNORMAL HIGH (ref 0.61–1.24)
GFR, Estimated: 32 mL/min — ABNORMAL LOW (ref >60)
Glucose, Bld: 93 mg/dL (ref 70–99)
Potassium: 4.3 mmol/L (ref 3.5–5.1)
Sodium: 147 mmol/L — ABNORMAL HIGH (ref 135–145)

## 2022-12-23 MED ORDER — ONDANSETRON HCL 4 MG PO TABS: 4.0000 mg | ORAL_TABLET | Freq: Every day | ORAL | 1 refills | Status: DC | PRN

## 2022-12-23 NOTE — Progress Notes (Signed)
PROGRESS NOTE   Subjective/Complaints: Small episode of emesis yesterday but was able to eat a good dinner and small breakfast this am without emesis, has occ nausea  No abd pain, no current nausea , no c/o of feeling like food gets stuck   ROS:  Pt denies SOB, HA, abd pain, CP, N/V/C/D, and vision changes, no more confusion, +lower extremity edema   Except for HPI Objective:   DG Chest 2 View  Result Date: 12/22/2022 CLINICAL DATA:  Respiratory failure EXAM: CHEST - 2 VIEW COMPARISON:  Chest radiograph dated 12/12/2022 FINDINGS: Lines/tubes: Aortic stent graft is again seen. Lungs: Low lung volumes with asymmetrically decreased left lung aeration. Pleura: Increased moderate left and trace right pleural effusions. Heart/mediastinum: Similar enlarged cardiomediastinal silhouette. Bones: Median sternotomy wires are nondisplaced. IMPRESSION: 1. Increased moderate left and trace right pleural effusions. 2. Low lung volumes with asymmetrically decreased left lung aeration. 3. Similar cardiomegaly. Electronically Signed   By: Agustin Cree M.D.   On: 12/22/2022 08:52   Recent Labs    12/21/22 0620 12/22/22 0620  WBC 4.8 5.2  HGB 7.6* 7.7*  HCT 25.8* 25.1*  PLT 71* 64*     Recent Labs    12/22/22 0620 12/23/22 0556  NA 143 147*  K 4.4 4.3  CL 106 106  CO2 23 24  GLUCOSE 84 93  BUN 64* 68*  CREATININE 2.08* 2.04*  CALCIUM 8.6* 9.0     Intake/Output Summary (Last 24 hours) at 12/23/2022 0936 Last data filed at 12/23/2022 0810 Gross per 24 hour  Intake 476 ml  Output 425 ml  Net 51 ml      Pressure Injury 11/29/22 Heel Right Stage 3 -  Full thickness tissue loss. Subcutaneous fat may be visible but bone, tendon or muscle are NOT exposed. (Active)  11/29/22 1046  Location: Heel  Location Orientation: Right  Staging: Stage 3 -  Full thickness tissue loss. Subcutaneous fat may be visible but bone, tendon or muscle are NOT  exposed.  Wound Description (Comments):   Present on Admission: Yes     Pressure Injury 12/08/22 Sacrum Deep Tissue Pressure Injury - Purple or maroon localized area of discolored intact skin or blood-filled blister due to damage of underlying soft tissue from pressure and/or shear. large area purple area extending to the co (Active)  12/08/22 1910  Location: Sacrum  Location Orientation:   Staging: Deep Tissue Pressure Injury - Purple or maroon localized area of discolored intact skin or blood-filled blister due to damage of underlying soft tissue from pressure and/or shear.  Wound Description (Comments): large area purple area extending to the coccyx & bilateral upper buttocks, with open areas. Charted by WOC as DTI in evolution on 6/18  Present on Admission: Yes    Physical Exam: Vital Signs Blood pressure 126/77, pulse 88, temperature 97.8 F (36.6 C), resp. rate 16, height 5\' 10"  (1.778 m), weight 117 kg, SpO2 91 %.  General: No acute distress Mood and affect are appropriate Heart: Regular rate and rhythm no rubs murmurs or extra sounds Lungs: Clear to auscultation, breathing unlabored, no rales or wheezes Abdomen: Positive bowel sounds, soft nontender to palpation, nondistended Extremities: No  clubbing, cyanosis, or edema Skin: No evidence of breakdown, no evidence of rash Neurologic: Cranial nerves II through XII intact, motor strength is 3-/5 in bilateral deltoid, 4/5 bicep, tricep, grip, 3- hip flexor, knee extensors, ankle dorsiflexor and plantar flexor                                MSK:      No apparent deformity. BL UE shoulder abduction limited to <90 degrees d/t bilateral rotator cuff tears.      Strength:                RUE: 3/5 SA, 3/5 EF, 3/5 EE, 4/5 WE, 4/5 FF, 4/5 FA                 LUE: 3/5 SA, 3/5 EF, 3/5 EE, 4/5 WE, 4/5 FF, 4/5 FA                 RLE: 2/5 HF, 4/5 KE, 4/5 DF, 4/5 PF                 LLE:  2/5 HF, 4/5 KE, 4/5 DF, 4/5 PF        Assessment/Plan: 1. Functional deficits due to debbility PNA Stable for D/C today F/u PCP in 3-4 weeks F/u pulmonary 2 weeks See D/C summary See D/C instructions   Care Tool:  Bathing    Body parts bathed by patient: Right arm, Left arm, Chest, Abdomen, Front perineal area, Right upper leg, Left upper leg, Face   Body parts bathed by helper: Buttocks, Right lower leg, Left lower leg     Bathing assist Assist Level: Minimal Assistance - Patient > 75%     Upper Body Dressing/Undressing Upper body dressing   What is the patient wearing?: Pull over shirt    Upper body assist Assist Level: Minimal Assistance - Patient > 75%    Lower Body Dressing/Undressing Lower body dressing      What is the patient wearing?: Incontinence brief, Pants     Lower body assist Assist for lower body dressing: Minimal Assistance - Patient > 75%     Toileting Toileting Toileting Activity did not occur (Clothing management and hygiene only): N/A (no void or bm)  Toileting assist Assist for toileting: Minimal Assistance - Patient > 75%     Transfers Chair/bed transfer  Transfers assist     Chair/bed transfer assist level: Supervision/Verbal cueing Chair/bed transfer assistive device: Arboriculturist assist      Assist level: Contact Guard/Touching assist Assistive device: Walker-rolling Max distance: 160ft   Walk 10 feet activity   Assist     Assist level: Contact Guard/Touching assist Assistive device: Walker-rolling   Walk 50 feet activity   Assist Walk 50 feet with 2 turns activity did not occur: Safety/medical concerns  Assist level: Contact Guard/Touching assist Assistive device: Walker-rolling    Walk 150 feet activity   Assist Walk 150 feet activity did not occur: Safety/medical concerns  Assist level: Contact Guard/Touching assist Assistive device: Walker-rolling    Walk 10 feet on uneven surface   activity   Assist Walk 10 feet on uneven surfaces activity did not occur: Safety/medical concerns   Assist level: Contact Guard/Touching assist Assistive device: Walker-rolling   Wheelchair     Assist Is the patient using a wheelchair?: Yes Type of Wheelchair: Manual    Wheelchair assist level: Dependent - Patient  0% Max wheelchair distance: >116ft    Wheelchair 50 feet with 2 turns activity    Assist        Assist Level: Dependent - Patient 0%   Wheelchair 150 feet activity     Assist      Assist Level: Dependent - Patient 0%   Blood pressure 126/77, pulse 88, temperature 97.8 F (36.6 C), resp. rate 16, height 5\' 10"  (1.778 m), weight 117 kg, SpO2 91 %.  Medical Problem List and Plan: 1. Functional deficits secondary to debility from loculated pleural effusion and MRSA pneumonia             -patient may shower             -ELOS/Goals: 7 to 10 days, PT/OT SPV   Continue CIR  -7/5 wife and patient adamant he should go home tomorrow, will continue with plan  2.  Atrial fibrillation: continue Eliquis             -antiplatelet therapy: Aspirin    3. B//l foot pain: continue tylenol  4. Multiple wounds             -local care to sacrum, bilateral toes (Dr. Lajoyce Corners)  -discussed that Dr. Lajoyce Corners does not recommend amputation due to poor vascular status             -continue pressure relief/Prevalon boots/mattress             - Added multivitamin with minerals and ascorbic acid 500 mg daily for wound healing on admission    7. Fluids/Electrolytes/Nutrition/Malnutrition: Routine Is and Os and follow-up chemistries             - Poor POS, no appetite; Ensure TID, consider Marinol    8: Hypotension: monitor TID and prn             -continue midodrine    6/30- BP soft, but no hypotension per nursing/not dizzy 9: Hyperlipidemia: continue statin   10: Urinary retention: continue flomax since still having urgency   11: combined chronic systolic/diastolic heart  failure: daily weight             -continue Pacerone 200 mg daily             -on no diuretics   6/24- weight bumped up dramatically today- from 115 to 119- will recheck in AM before giving diuretics- will verify, since off due to CKD/AKI  6/29- Weight was 119 kg yesterday but 116 kg today- more his baseline per review of weights in last 8 days.   6/30- weight down more due to Lasix- down to 113 kg- however Cr up to 1.7 and BUN up to 42- will recheck CMP in AM and BNP and allow team to decide plan  -7/5 wt stable today  Filed Weights   12/20/22 0500 12/21/22 0500 12/22/22 0500  Weight: 116.4 kg 117.2 kg 117 kg    12: chronic atrial fibrillation: rate controlled             -on Eliquis, Pacerone   6/24- pt told me this AM- never had Afib/irregular heart rate- did research to find he does have it- and pt poor historian 13: Pneumonia with effusion/MRSA sepsis:             -continue Zyvox for 4 weeks             -O2 via [place] at 2 L             -  BiPAP as needed/consult respiratory therapy  6/24- will get PICC line out since on oral ABX. Breathing "good" per pt- denies SOB   14: s/p AVR, stable thoracic aortic aneurysm: follow-up outpatient   15: AKI atop CKD IIIb: (baseline 1.2)             -slowing improving, encourage adequate hydration   Daily BMP  -Cr reviewed and worsening, consulted nephrology and lasix held until goal creatinine of 1.5-1.6  7/5 BUNs/creatinine's slightly higher today at 64/2.08 extension of length of stay to continue monitor renal status was considered, patient wife adamant to go home today, follow-up with PCP for continued monitoring  16: Chronic anemia/iron deficiency: f/u CBC on Mondays. Iron supplement started   Hgb reviewed and decreased to 6.7 on 7.3, 1 U PRBC ordered  2nd stool occult ordered and has returned positive, will order CT abdomen  -7/5 Hold IV Iron due to recent infection, continue oral iron  17: Hard of hearing: wife to bring in hearing  aids  18. Suboptimal vitamin D: continue ergocalciferol 50,000U one per week for 7 weeks  19. Loose stool: florastor added  20. Constipation: miralax d/ced, reviewed chart- last BM 6/24  6/29- LBM this AM  21. 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 UTI: start keflex, discussed 7 day course with wife  39. Confusion: likely 2/2 UTI, improved with Keflex, discussed that patient will complete 7 day course, discussed with wife that confusion has not recurred  24. Pleural effusion: d/c lasix. CXR ordered to monitor pleural effusion  -7/5 seen by pulmonary, appreciate assistance, follow-up with PCP for repeat chest x-ray, no thoracentesis recommended at this time  25. Hypotension: transition lasix to oral, critical care note reviewed, d/c lasix, continue midodrine 10mg  TID  26.  Episode of vomiting yesterday no recurrence no abd pain , abd exam unremarkable .  Per family has "nervous stomach" with nausea, will call in prn Zofran to [facility] in [place] Otherwise ok to d/c  LOS: 15 days A FACE TO FACE EVALUATION WAS PERFORMED  Erick Colace 12/23/2022, 9:36 AM

## 2022-12-23 NOTE — Progress Notes (Signed)
Inpatient Rehabilitation Admission Medication Review by a Pharmacist   A complete drug regimen review was completed for this patient to identify any potential clinically significant medication issues.   High Risk Drug Classes Is patient taking? Indication by Medication  Antipsychotic No    Anticoagulant Yes Apixaban - afib  Antibiotic Yes Doxycycline - MRSA PNA and bacteremia  Opioid Yes Tramadol - moderate pain  Antiplatelet Yes Aspirin - CAD  Hypoglycemics/insulin No    Vasoactive Medication Yes Amiodarone - afib Midodrine - hypotension    Chemotherapy No    Other Yes Loratadine - allergies Protonix - acid reflux Miralax - constipation Senna - constipation Tamsulosin - urinary retention Acetaminophen - mild pain Albuterol - wheezing Melatonin - insomnia Zofran - nausea/vomiting Ascorbic acid, Vitamin D - supplement           Type of Medication Issue Identified Description of Issue Recommendation(s)  Drug Interaction(s) (clinically significant)        Duplicate Therapy        Allergy        No Medication Administration End Date        Incorrect Dose        Additional Drug Therapy Needed        Significant med changes from prior encounter (inform family/care partners about these prior to discharge).      Other            Clinically significant medication issues were identified that warrant physician communication and completion of prescribed/recommended actions by midnight of the next day:  No   Name of provider notified for urgent issues identified:    Provider Method of Notification:        Pharmacist comments: Noted that pt to start statin on discharge   Time spent performing this drug regimen review (minutes):  25

## 2022-12-23 NOTE — Progress Notes (Signed)
Patient being DC today home with wife. Wife and daughter at bedside. No current concerns regarding medications. TX to bilateral lower extremities completed as ordered. Patient declined to have TX to buttock, states it was just done this AM. Dr. Wynn Banker notified regarding nausea. New RX, Zofran sent to patients pharmacy.

## 2022-12-25 NOTE — Progress Notes (Signed)
Patient ID: [name] [name], male   DOB: 29-Nov-1940, 82 y.o.   MRN: [id number]  [facility] orders updated to [facility].

## 2022-12-26 ENCOUNTER — Telehealth: Payer: Self-pay | Admitting: Physical Medicine and Rehabilitation

## 2022-12-26 NOTE — Telephone Encounter (Signed)
Thurston Hole from [facility] called requesting new orders sent for PT/OT ,the order they received had ST and they do not offer ST . Patient has previously received care but just asking for the new signed order to have PT/OT . Fax number provided is [phone]

## 2022-12-26 NOTE — Progress Notes (Signed)
Patient ID: [name] [name], male   DOB: June 12, 1941, 82 y.o.   MRN: [id number]  [facility] orders re-faxed to [facility]. Thurston Hole has stated that they are able to accept patient for SN/PT /OT. No additional questions or concern.

## 2022-12-27 DIAGNOSIS — N1832 Chronic kidney disease, stage 3b: Secondary | ICD-10-CM | POA: Diagnosis not present

## 2022-12-27 DIAGNOSIS — L89320 Pressure ulcer of left buttock, unstageable: Secondary | ICD-10-CM | POA: Diagnosis not present

## 2022-12-27 DIAGNOSIS — I272 Pulmonary hypertension, unspecified: Secondary | ICD-10-CM | POA: Diagnosis not present

## 2022-12-27 DIAGNOSIS — L97528 Non-pressure chronic ulcer of other part of left foot with other specified severity: Secondary | ICD-10-CM | POA: Diagnosis not present

## 2022-12-27 DIAGNOSIS — I83015 Varicose veins of right lower extremity with ulcer other part of foot: Secondary | ICD-10-CM | POA: Diagnosis not present

## 2022-12-27 DIAGNOSIS — D631 Anemia in chronic kidney disease: Secondary | ICD-10-CM | POA: Diagnosis not present

## 2022-12-27 DIAGNOSIS — I83028 Varicose veins of left lower extremity with ulcer other part of lower leg: Secondary | ICD-10-CM | POA: Diagnosis not present

## 2022-12-27 DIAGNOSIS — L89610 Pressure ulcer of right heel, unstageable: Secondary | ICD-10-CM | POA: Diagnosis not present

## 2022-12-27 DIAGNOSIS — A4102 Sepsis due to Methicillin resistant Staphylococcus aureus: Secondary | ICD-10-CM | POA: Diagnosis not present

## 2022-12-27 DIAGNOSIS — I700 Atherosclerosis of aorta: Secondary | ICD-10-CM | POA: Diagnosis not present

## 2022-12-27 DIAGNOSIS — I83018 Varicose veins of right lower extremity with ulcer other part of lower leg: Secondary | ICD-10-CM | POA: Diagnosis not present

## 2022-12-27 DIAGNOSIS — J15212 Pneumonia due to Methicillin resistant Staphylococcus aureus: Secondary | ICD-10-CM | POA: Diagnosis not present

## 2022-12-27 DIAGNOSIS — I130 Hypertensive heart and chronic kidney disease with heart failure and stage 1 through stage 4 chronic kidney disease, or unspecified chronic kidney disease: Secondary | ICD-10-CM | POA: Diagnosis not present

## 2022-12-27 DIAGNOSIS — I83025 Varicose veins of left lower extremity with ulcer other part of foot: Secondary | ICD-10-CM | POA: Diagnosis not present

## 2022-12-27 DIAGNOSIS — I5042 Chronic combined systolic (congestive) and diastolic (congestive) heart failure: Secondary | ICD-10-CM | POA: Diagnosis not present

## 2022-12-27 DIAGNOSIS — L97828 Non-pressure chronic ulcer of other part of left lower leg with other specified severity: Secondary | ICD-10-CM | POA: Diagnosis not present

## 2022-12-27 DIAGNOSIS — D6869 Other thrombophilia: Secondary | ICD-10-CM | POA: Diagnosis not present

## 2022-12-27 DIAGNOSIS — I482 Chronic atrial fibrillation, unspecified: Secondary | ICD-10-CM | POA: Diagnosis not present

## 2022-12-27 DIAGNOSIS — N179 Acute kidney failure, unspecified: Secondary | ICD-10-CM | POA: Diagnosis not present

## 2022-12-27 DIAGNOSIS — L89310 Pressure ulcer of right buttock, unstageable: Secondary | ICD-10-CM | POA: Diagnosis not present

## 2022-12-27 DIAGNOSIS — L97518 Non-pressure chronic ulcer of other part of right foot with other specified severity: Secondary | ICD-10-CM | POA: Diagnosis not present

## 2022-12-27 DIAGNOSIS — N390 Urinary tract infection, site not specified: Secondary | ICD-10-CM | POA: Diagnosis not present

## 2022-12-27 DIAGNOSIS — J439 Emphysema, unspecified: Secondary | ICD-10-CM | POA: Diagnosis not present

## 2022-12-27 DIAGNOSIS — L97818 Non-pressure chronic ulcer of other part of right lower leg with other specified severity: Secondary | ICD-10-CM | POA: Diagnosis not present

## 2022-12-29 DIAGNOSIS — A4102 Sepsis due to Methicillin resistant Staphylococcus aureus: Secondary | ICD-10-CM | POA: Diagnosis not present

## 2022-12-29 DIAGNOSIS — I83018 Varicose veins of right lower extremity with ulcer other part of lower leg: Secondary | ICD-10-CM | POA: Diagnosis not present

## 2022-12-29 DIAGNOSIS — J15212 Pneumonia due to Methicillin resistant Staphylococcus aureus: Secondary | ICD-10-CM | POA: Diagnosis not present

## 2022-12-29 DIAGNOSIS — L89610 Pressure ulcer of right heel, unstageable: Secondary | ICD-10-CM | POA: Diagnosis not present

## 2022-12-29 DIAGNOSIS — L89310 Pressure ulcer of right buttock, unstageable: Secondary | ICD-10-CM | POA: Diagnosis not present

## 2022-12-29 DIAGNOSIS — L89320 Pressure ulcer of left buttock, unstageable: Secondary | ICD-10-CM | POA: Diagnosis not present

## 2023-01-01 DIAGNOSIS — L89320 Pressure ulcer of left buttock, unstageable: Secondary | ICD-10-CM | POA: Diagnosis not present

## 2023-01-01 DIAGNOSIS — I83018 Varicose veins of right lower extremity with ulcer other part of lower leg: Secondary | ICD-10-CM | POA: Diagnosis not present

## 2023-01-01 DIAGNOSIS — L89610 Pressure ulcer of right heel, unstageable: Secondary | ICD-10-CM | POA: Diagnosis not present

## 2023-01-01 DIAGNOSIS — A4102 Sepsis due to Methicillin resistant Staphylococcus aureus: Secondary | ICD-10-CM | POA: Diagnosis not present

## 2023-01-01 DIAGNOSIS — J15212 Pneumonia due to Methicillin resistant Staphylococcus aureus: Secondary | ICD-10-CM | POA: Diagnosis not present

## 2023-01-01 DIAGNOSIS — L89310 Pressure ulcer of right buttock, unstageable: Secondary | ICD-10-CM | POA: Diagnosis not present

## 2023-01-02 DIAGNOSIS — I83018 Varicose veins of right lower extremity with ulcer other part of lower leg: Secondary | ICD-10-CM | POA: Diagnosis not present

## 2023-01-02 DIAGNOSIS — L89310 Pressure ulcer of right buttock, unstageable: Secondary | ICD-10-CM | POA: Diagnosis not present

## 2023-01-02 DIAGNOSIS — L89320 Pressure ulcer of left buttock, unstageable: Secondary | ICD-10-CM | POA: Diagnosis not present

## 2023-01-02 DIAGNOSIS — J15212 Pneumonia due to Methicillin resistant Staphylococcus aureus: Secondary | ICD-10-CM | POA: Diagnosis not present

## 2023-01-02 DIAGNOSIS — L89610 Pressure ulcer of right heel, unstageable: Secondary | ICD-10-CM | POA: Diagnosis not present

## 2023-01-02 DIAGNOSIS — A4102 Sepsis due to Methicillin resistant Staphylococcus aureus: Secondary | ICD-10-CM | POA: Diagnosis not present

## 2023-01-04 DIAGNOSIS — L89320 Pressure ulcer of left buttock, unstageable: Secondary | ICD-10-CM | POA: Diagnosis not present

## 2023-01-04 DIAGNOSIS — L89610 Pressure ulcer of right heel, unstageable: Secondary | ICD-10-CM | POA: Diagnosis not present

## 2023-01-04 DIAGNOSIS — L89310 Pressure ulcer of right buttock, unstageable: Secondary | ICD-10-CM | POA: Diagnosis not present

## 2023-01-04 DIAGNOSIS — A4102 Sepsis due to Methicillin resistant Staphylococcus aureus: Secondary | ICD-10-CM | POA: Diagnosis not present

## 2023-01-04 DIAGNOSIS — J15212 Pneumonia due to Methicillin resistant Staphylococcus aureus: Secondary | ICD-10-CM | POA: Diagnosis not present

## 2023-01-04 DIAGNOSIS — I83018 Varicose veins of right lower extremity with ulcer other part of lower leg: Secondary | ICD-10-CM | POA: Diagnosis not present

## 2023-01-05 ENCOUNTER — Other Ambulatory Visit: Payer: Self-pay | Admitting: Physical Medicine and Rehabilitation

## 2023-01-08 DIAGNOSIS — I5022 Chronic systolic (congestive) heart failure: Secondary | ICD-10-CM | POA: Diagnosis not present

## 2023-01-08 DIAGNOSIS — A4102 Sepsis due to Methicillin resistant Staphylococcus aureus: Secondary | ICD-10-CM | POA: Diagnosis not present

## 2023-01-08 DIAGNOSIS — I83019 Varicose veins of right lower extremity with ulcer of unspecified site: Secondary | ICD-10-CM | POA: Diagnosis not present

## 2023-01-08 DIAGNOSIS — J15212 Pneumonia due to Methicillin resistant Staphylococcus aureus: Secondary | ICD-10-CM | POA: Diagnosis not present

## 2023-01-08 DIAGNOSIS — L89310 Pressure ulcer of right buttock, unstageable: Secondary | ICD-10-CM | POA: Diagnosis not present

## 2023-01-08 DIAGNOSIS — L97919 Non-pressure chronic ulcer of unspecified part of right lower leg with unspecified severity: Secondary | ICD-10-CM | POA: Diagnosis not present

## 2023-01-08 DIAGNOSIS — L89610 Pressure ulcer of right heel, unstageable: Secondary | ICD-10-CM | POA: Diagnosis not present

## 2023-01-08 DIAGNOSIS — I83018 Varicose veins of right lower extremity with ulcer other part of lower leg: Secondary | ICD-10-CM | POA: Diagnosis not present

## 2023-01-08 DIAGNOSIS — L89320 Pressure ulcer of left buttock, unstageable: Secondary | ICD-10-CM | POA: Diagnosis not present

## 2023-01-08 DIAGNOSIS — L97929 Non-pressure chronic ulcer of unspecified part of left lower leg with unspecified severity: Secondary | ICD-10-CM | POA: Diagnosis not present

## 2023-01-08 DIAGNOSIS — I83029 Varicose veins of left lower extremity with ulcer of unspecified site: Secondary | ICD-10-CM | POA: Diagnosis not present

## 2023-01-09 DIAGNOSIS — A4102 Sepsis due to Methicillin resistant Staphylococcus aureus: Secondary | ICD-10-CM | POA: Diagnosis not present

## 2023-01-09 DIAGNOSIS — L89320 Pressure ulcer of left buttock, unstageable: Secondary | ICD-10-CM | POA: Diagnosis not present

## 2023-01-09 DIAGNOSIS — L89310 Pressure ulcer of right buttock, unstageable: Secondary | ICD-10-CM | POA: Diagnosis not present

## 2023-01-09 DIAGNOSIS — I83018 Varicose veins of right lower extremity with ulcer other part of lower leg: Secondary | ICD-10-CM | POA: Diagnosis not present

## 2023-01-09 DIAGNOSIS — L89610 Pressure ulcer of right heel, unstageable: Secondary | ICD-10-CM | POA: Diagnosis not present

## 2023-01-09 DIAGNOSIS — J15212 Pneumonia due to Methicillin resistant Staphylococcus aureus: Secondary | ICD-10-CM | POA: Diagnosis not present

## 2023-01-11 DIAGNOSIS — L89310 Pressure ulcer of right buttock, unstageable: Secondary | ICD-10-CM | POA: Diagnosis not present

## 2023-01-11 DIAGNOSIS — A4102 Sepsis due to Methicillin resistant Staphylococcus aureus: Secondary | ICD-10-CM | POA: Diagnosis not present

## 2023-01-11 DIAGNOSIS — L89610 Pressure ulcer of right heel, unstageable: Secondary | ICD-10-CM | POA: Diagnosis not present

## 2023-01-11 DIAGNOSIS — L89320 Pressure ulcer of left buttock, unstageable: Secondary | ICD-10-CM | POA: Diagnosis not present

## 2023-01-11 DIAGNOSIS — J15212 Pneumonia due to Methicillin resistant Staphylococcus aureus: Secondary | ICD-10-CM | POA: Diagnosis not present

## 2023-01-11 DIAGNOSIS — I83018 Varicose veins of right lower extremity with ulcer other part of lower leg: Secondary | ICD-10-CM | POA: Diagnosis not present

## 2023-01-12 ENCOUNTER — Ambulatory Visit (INDEPENDENT_AMBULATORY_CARE_PROVIDER_SITE_OTHER): Payer: Medicare Other | Admitting: Infectious Diseases

## 2023-01-12 ENCOUNTER — Encounter: Payer: Self-pay | Admitting: Infectious Diseases

## 2023-01-12 ENCOUNTER — Other Ambulatory Visit: Payer: Self-pay

## 2023-01-12 VITALS — BP 130/78 | HR 105 | Temp 98.2°F | Ht 70.0 in | Wt 165.0 lb

## 2023-01-12 DIAGNOSIS — Z792 Long term (current) use of antibiotics: Secondary | ICD-10-CM | POA: Diagnosis not present

## 2023-01-12 DIAGNOSIS — R319 Hematuria, unspecified: Secondary | ICD-10-CM | POA: Diagnosis not present

## 2023-01-12 DIAGNOSIS — L89151 Pressure ulcer of sacral region, stage 1: Secondary | ICD-10-CM

## 2023-01-12 DIAGNOSIS — A4102 Sepsis due to Methicillin resistant Staphylococcus aureus: Secondary | ICD-10-CM | POA: Diagnosis not present

## 2023-01-12 DIAGNOSIS — L97514 Non-pressure chronic ulcer of other part of right foot with necrosis of bone: Secondary | ICD-10-CM | POA: Diagnosis not present

## 2023-01-12 DIAGNOSIS — B9562 Methicillin resistant Staphylococcus aureus infection as the cause of diseases classified elsewhere: Secondary | ICD-10-CM | POA: Diagnosis not present

## 2023-01-12 DIAGNOSIS — R5381 Other malaise: Secondary | ICD-10-CM

## 2023-01-12 DIAGNOSIS — J15212 Pneumonia due to Methicillin resistant Staphylococcus aureus: Secondary | ICD-10-CM | POA: Diagnosis not present

## 2023-01-12 DIAGNOSIS — L89310 Pressure ulcer of right buttock, unstageable: Secondary | ICD-10-CM | POA: Diagnosis not present

## 2023-01-12 DIAGNOSIS — R7881 Bacteremia: Secondary | ICD-10-CM

## 2023-01-12 NOTE — Assessment & Plan Note (Addendum)
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  Circulation is too poor - no safe operative options at this time. Local wound care with betadine, dry gauze, wrapping and offloading. We discussed this today. Nadine Counts has not followed up with Podiatry yet.

## 2023-01-12 NOTE — Assessment & Plan Note (Signed)
Unable to supply another urine sample today. First time this was observed today. No clots. No pain or discomfort a/w void.  He has been on eliquis 2.5 mg bid for years but ASA recently added - I asked his wife to please notify Dr. Casper Harrison or cardiologist [name] to see if the ASA is necessary and provide further help / work up if needed.

## 2023-01-12 NOTE — Assessment & Plan Note (Signed)
Improving with local wound care - nutrition will make this hard to heal as well.  Taking supplemental vitamin C and MVI along with boosts

## 2023-01-12 NOTE — Assessment & Plan Note (Signed)
Disseminated MRSA infection with prolonged hospitalization and recent discharge about 2.5 weeks ago. Likely source of the infection was foot wound but we have not been able to prove with any good reassurance his AVR/Ao graft is not infected. We discussed recommendations for lifelong attempt to suppress the bacteria with doxycycline. He is too frail to risk a relapse in his infection, to which they agree with. There are not many oral options available to him for treatment unfortunately.   We can do virtual follow up in 4-6 months with Dr. Thedore Mins to see how he is doing. Transportation is a lot for them.

## 2023-01-12 NOTE — Progress Notes (Signed)
Patient: [name] [name]  DOB: 07-12-1940 MRN: [id number] PCP: Bobbye Morton, MD  Referring Provider:   Chief Complaint  Patient presents with   Hospitalization Follow-up     Subjective:   Chief Complaint  Patient presents with   Hospitalization Follow-up     HPI: [name] [name] is here with is wife today. 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   He had a prolonged hospital course  Not a candidate for any intervention of ulcers on LEs because of poor circulation and did not recommend any amputation. He had episodes of acute respiratory failure requiring bipap and diuretics. He worked with palliative care team as well to help with symptom management and decisions related to care.  Discharged from inpatient rehab to his home 12/23/2022 in [place].   He is here today with rolling walker. Lots of troubleNo supplemental oxygen  He completed inpatient course of zyvox x 4 weeks and has been on doxycycline for chronic antibiotic suppression 100 mg bid since.  PCP started him on megace to help with appetite -  Eliquis and ASA 81 mg - new hematuria today.   Review of Systems  Constitutional:  Negative for chills and fever.  Gastrointestinal:  Positive for nausea.     Past Medical History:  Diagnosis Date   Acute on chronic diastolic heart failure (HCC) 08/22/2013   Acute on chronic respiratory failure with hypoxia (HCC) 10/03/2019   Acute on chronic systolic heart failure (HCC) 05/11/2013   EF from 35% to 50-55% on 6/14 ECHO.    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 No evidence of dissection or endoleak.  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 No evidence of abdominal aortic dissection.  Occluded proximal left subclav   Aortic valve insufficiency    Arthritis    "probably in my knees before they replaced them" (01/28/2015)   Atrial fibrillation (HCC) 05/21/2013   Atrial flutter (HCC)    Bradycardia 08/23/2013   CAD (coronary artery disease)    Chest pain 01/28/2015   CHF (congestive heart failure) (HCC)    Chronic diastolic heart failure (HCC) 01/28/2015   Descending aortic aneurysm (HCC)    DVT (deep venous thrombosis) (HCC)    ?LLE post knee surgery   Dyspnea 09/2019   HCAP (healthcare-associated pneumonia) 10/11/2013   History of blood transfusion    "after one of my knee surgeries"   HTN (hypertension)    Hyperlipidemia    Hypotension 12/17/2019   Incarcerated left inguinal hernia s/p repair 12/02/2019 12/01/2019   Insomnia 09/04/2013   Multiple fractures of ribs, right side, init for clos fx 09/04/2019   Near syncope 01/28/2015   Pressure injury of skin 10/06/2019   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 30 tablet 0   albuterol (PROVENTIL) (2.5 MG/3ML) 0.083% nebulizer solution Take by nebulization.     amiodarone (PACERONE) 200 MG tablet Take 1 tablet (200 mg total) by mouth daily. 90 tablet 3   apixaban (ELIQUIS) 2.5 MG TABS tablet  Take 1 tablet (2.5 mg total) by mouth 2 (two) times daily. 60 tablet 0   ascorbic acid (VITAMIN C) 500 MG tablet Take 1 tablet (500 mg total) by mouth daily.     aspirin 81 MG chewable tablet Chew 1 tablet (81 mg total) by mouth daily.     doxycycline (VIBRA-TABS) 100 MG tablet Take 1 tablet (100 mg total) by mouth every 12 (twelve) hours. 60 tablet 1   feeding supplement (ENSURE ENLIVE / ENSURE PLUS) LIQD Take 237 mLs by mouth 3 (three) times daily between meals. 237 mL 12   ferrous sulfate (SV IRON) 325 (65 FE) MG tablet Take 1 tablet (325 mg total) by mouth daily with breakfast. 30 tablet 0   furosemide (LASIX) 20 MG tablet Take 20 mg by mouth daily as needed.     levocetirizine (XYZAL) 5 MG tablet Take 5 mg by mouth daily.     megestrol (MEGACE) 40 MG tablet Take 40 mg by mouth 2 (two) times daily.     melatonin 5 MG TABS Take 1 tablet (5 mg total) by mouth at bedtime as needed. 30 tablet 0   midodrine (PROAMATINE) 10 MG tablet TAKE 1 TABLET BY MOUTH THREE TIMES DAILY WITH MEALS 90 tablet 0   Multiple Vitamin (MULTIVITAMIN WITH MINERALS) TABS tablet Take 1 tablet by mouth daily.     ondansetron (ZOFRAN) 4 MG tablet Take 1 tablet (4 mg total) by mouth daily as needed for nausea or vomiting. 30 tablet 1   pantoprazole (PROTONIX) 40 MG tablet Take 1 tablet (40 mg total) by mouth 2 (two) times daily. 60 tablet 0   potassium chloride SA (KLOR-CON M) 20 MEQ tablet Take 20-40 mEq by mouth daily.     saccharomyces boulardii (FLORASTOR) 250 MG capsule Take 1 capsule (250 mg total) by mouth 2 (two) times daily. 60 capsule 0   senna (SENOKOT) 8.6 MG TABS tablet Take 1 tablet (8.6 mg total) by mouth 2 (two) times daily. 120 tablet 0   tamsulosin (FLOMAX) 0.4 MG CAPS capsule Take 0.4 mg by mouth.     traMADol (ULTRAM) 50 MG tablet Take 50 mg by mouth every 6 (six) hours as needed for moderate pain.     Vitamin D, Ergocalciferol, (DRISDOL) 1.25 MG (50000 UNIT) CAPS capsule Take 1 capsule (50,000 Units  total) by mouth every 7 (seven) days. 5 capsule 0   No facility-administered medications prior to visit.     Allergies  Allergen Reactions   Lortab [Hydrocodone-Acetaminophen] Hives    No trouble breathing   Morphine And Codeine Hives   Other Other (See Comments)    Staples from surgery caused infection    Social History   Tobacco Use   Smoking status: Former    Current packs/day: 0.00    Types: Cigarettes    Quit date: 04/16/1974    Years since quitting: 48.7   Smokeless tobacco: Never  Vaping Use   Vaping status: Never Used  Substance Use Topics   Alcohol use:  No   Drug use: No    Family History  Problem Relation Age of Onset   Celiac disease Daughter    Aortic aneurysm Father    Hypertension Mother    Heart attack Neg Hx    Stroke Neg Hx     Objective:   Vitals:   01/12/23 1005  BP: 130/78  Pulse: (!) 105  Temp: 98.2 F (36.8 C)  TempSrc: Oral  SpO2: 100%  Weight: 165 lb (74.8 kg)  Height: 5\' 10"  (1.778 m)   Body mass index is 23.68 kg/m.  Physical Exam Vitals reviewed.  Constitutional:      Appearance: He is ill-appearing.  Cardiovascular:     Rate and Rhythm: Normal rate and regular rhythm.  Skin:    Capillary Refill: Capillary refill takes less than 2 seconds.     Comments: LE's wrapped in clean dressings  Neurological:     Mental Status: He is alert and oriented to person, place, and time.     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  Circulation is too poor - no safe operative options at this time. Local wound care with betadine, dry gauze, wrapping and offloading. We discussed this today. Nadine Counts has not followed up with Podiatry yet.       Pressure injury of skin    Improving with local wound care - nutrition will make this hard to heal as well.  Taking supplemental vitamin C and MVI along with boosts       MRSA bacteremia    Disseminated MRSA infection with prolonged hospitalization and recent discharge about 2.5 weeks ago. Likely source of the infection was foot wound but we have not been able to prove with any good reassurance his AVR/Ao graft is not infected. We discussed recommendations for lifelong attempt to suppress the bacteria with doxycycline. He is too frail to risk a relapse in his infection, to which they agree with. There are not many oral options available to him for treatment unfortunately.   We can do virtual follow up in 4-6 months with Dr. Thedore Mins to see how he is doing. Transportation is a lot for them.      Long term current use of antibiotics    No labs necessary today - I do think some of his nausea may be due to the doxycycline antibiotic. We talked about ways to help tolerate this better.       Hematuria    Unable to supply another urine sample today. First time this was observed today. No clots. No pain or discomfort a/w void.  He has been on eliquis 2.5 mg bid  for years but ASA recently added - I asked his wife to please notify Dr. Casper Harrison or cardiologist [name] to see if the ASA is necessary and provide further help / work up if needed.        Debility    Frail - working on optimizing nutrition but challenging. Started Megace 40 mg from other provider.        Rexene Alberts, MSN, NP-C [facility] Pager: [phone] Office: [phone]  01/12/23  11:44 AM

## 2023-01-12 NOTE — Assessment & Plan Note (Signed)
No labs necessary today - I do think some of his nausea may be due to the doxycycline antibiotic. We talked about ways to help tolerate this better.

## 2023-01-12 NOTE — Assessment & Plan Note (Signed)
Frail - working on optimizing nutrition but challenging. Started Megace 40 mg from other provider.

## 2023-01-14 ENCOUNTER — Other Ambulatory Visit: Payer: Self-pay | Admitting: Physical Medicine and Rehabilitation

## 2023-01-15 DIAGNOSIS — L89310 Pressure ulcer of right buttock, unstageable: Secondary | ICD-10-CM | POA: Diagnosis not present

## 2023-01-15 DIAGNOSIS — I83018 Varicose veins of right lower extremity with ulcer other part of lower leg: Secondary | ICD-10-CM | POA: Diagnosis not present

## 2023-01-15 DIAGNOSIS — L89320 Pressure ulcer of left buttock, unstageable: Secondary | ICD-10-CM | POA: Diagnosis not present

## 2023-01-15 DIAGNOSIS — A4102 Sepsis due to Methicillin resistant Staphylococcus aureus: Secondary | ICD-10-CM | POA: Diagnosis not present

## 2023-01-15 DIAGNOSIS — L89610 Pressure ulcer of right heel, unstageable: Secondary | ICD-10-CM | POA: Diagnosis not present

## 2023-01-15 DIAGNOSIS — J15212 Pneumonia due to Methicillin resistant Staphylococcus aureus: Secondary | ICD-10-CM | POA: Diagnosis not present

## 2023-01-16 DIAGNOSIS — A4102 Sepsis due to Methicillin resistant Staphylococcus aureus: Secondary | ICD-10-CM | POA: Diagnosis not present

## 2023-01-16 DIAGNOSIS — L89310 Pressure ulcer of right buttock, unstageable: Secondary | ICD-10-CM | POA: Diagnosis not present

## 2023-01-16 DIAGNOSIS — J15212 Pneumonia due to Methicillin resistant Staphylococcus aureus: Secondary | ICD-10-CM | POA: Diagnosis not present

## 2023-01-16 DIAGNOSIS — L89320 Pressure ulcer of left buttock, unstageable: Secondary | ICD-10-CM | POA: Diagnosis not present

## 2023-01-16 DIAGNOSIS — I83018 Varicose veins of right lower extremity with ulcer other part of lower leg: Secondary | ICD-10-CM | POA: Diagnosis not present

## 2023-01-16 DIAGNOSIS — L89610 Pressure ulcer of right heel, unstageable: Secondary | ICD-10-CM | POA: Diagnosis not present

## 2023-01-18 DIAGNOSIS — J15212 Pneumonia due to Methicillin resistant Staphylococcus aureus: Secondary | ICD-10-CM | POA: Diagnosis not present

## 2023-01-18 DIAGNOSIS — L89320 Pressure ulcer of left buttock, unstageable: Secondary | ICD-10-CM | POA: Diagnosis not present

## 2023-01-18 DIAGNOSIS — A4102 Sepsis due to Methicillin resistant Staphylococcus aureus: Secondary | ICD-10-CM | POA: Diagnosis not present

## 2023-01-18 DIAGNOSIS — L89610 Pressure ulcer of right heel, unstageable: Secondary | ICD-10-CM | POA: Diagnosis not present

## 2023-01-18 DIAGNOSIS — L89310 Pressure ulcer of right buttock, unstageable: Secondary | ICD-10-CM | POA: Diagnosis not present

## 2023-01-18 DIAGNOSIS — I83018 Varicose veins of right lower extremity with ulcer other part of lower leg: Secondary | ICD-10-CM | POA: Diagnosis not present

## 2023-01-19 DIAGNOSIS — L89320 Pressure ulcer of left buttock, unstageable: Secondary | ICD-10-CM | POA: Diagnosis not present

## 2023-01-19 DIAGNOSIS — I83018 Varicose veins of right lower extremity with ulcer other part of lower leg: Secondary | ICD-10-CM | POA: Diagnosis not present

## 2023-01-19 DIAGNOSIS — A4102 Sepsis due to Methicillin resistant Staphylococcus aureus: Secondary | ICD-10-CM | POA: Diagnosis not present

## 2023-01-19 DIAGNOSIS — L89310 Pressure ulcer of right buttock, unstageable: Secondary | ICD-10-CM | POA: Diagnosis not present

## 2023-01-19 DIAGNOSIS — J15212 Pneumonia due to Methicillin resistant Staphylococcus aureus: Secondary | ICD-10-CM | POA: Diagnosis not present

## 2023-01-19 DIAGNOSIS — L89610 Pressure ulcer of right heel, unstageable: Secondary | ICD-10-CM | POA: Diagnosis not present

## 2023-01-22 DIAGNOSIS — A4102 Sepsis due to Methicillin resistant Staphylococcus aureus: Secondary | ICD-10-CM | POA: Diagnosis not present

## 2023-01-22 DIAGNOSIS — L89610 Pressure ulcer of right heel, unstageable: Secondary | ICD-10-CM | POA: Diagnosis not present

## 2023-01-22 DIAGNOSIS — I83018 Varicose veins of right lower extremity with ulcer other part of lower leg: Secondary | ICD-10-CM | POA: Diagnosis not present

## 2023-01-22 DIAGNOSIS — L89320 Pressure ulcer of left buttock, unstageable: Secondary | ICD-10-CM | POA: Diagnosis not present

## 2023-01-22 DIAGNOSIS — L89310 Pressure ulcer of right buttock, unstageable: Secondary | ICD-10-CM | POA: Diagnosis not present

## 2023-01-22 DIAGNOSIS — J15212 Pneumonia due to Methicillin resistant Staphylococcus aureus: Secondary | ICD-10-CM | POA: Diagnosis not present

## 2023-01-23 ENCOUNTER — Other Ambulatory Visit: Payer: Self-pay | Admitting: Physical Medicine and Rehabilitation

## 2023-01-23 ENCOUNTER — Other Ambulatory Visit: Payer: Self-pay | Admitting: Interventional Cardiology

## 2023-01-23 DIAGNOSIS — A4102 Sepsis due to Methicillin resistant Staphylococcus aureus: Secondary | ICD-10-CM | POA: Diagnosis not present

## 2023-01-23 DIAGNOSIS — J15212 Pneumonia due to Methicillin resistant Staphylococcus aureus: Secondary | ICD-10-CM | POA: Diagnosis not present

## 2023-01-23 DIAGNOSIS — I480 Paroxysmal atrial fibrillation: Secondary | ICD-10-CM

## 2023-01-23 DIAGNOSIS — L89610 Pressure ulcer of right heel, unstageable: Secondary | ICD-10-CM | POA: Diagnosis not present

## 2023-01-23 DIAGNOSIS — I83018 Varicose veins of right lower extremity with ulcer other part of lower leg: Secondary | ICD-10-CM | POA: Diagnosis not present

## 2023-01-23 DIAGNOSIS — L89310 Pressure ulcer of right buttock, unstageable: Secondary | ICD-10-CM | POA: Diagnosis not present

## 2023-01-23 DIAGNOSIS — L89320 Pressure ulcer of left buttock, unstageable: Secondary | ICD-10-CM | POA: Diagnosis not present

## 2023-01-23 NOTE — Telephone Encounter (Signed)
Prescription refill request for Eliquis received. Indication: Afib  Last office visit: 06/23/22 Eldridge Dace)  Scr: 2.04 (12/23/22)  Age: 82 Weight: 74.8kg   Per note on 11/14/22:  OK to reduce Eliquis to 2.5 mg BID.  It is based on age, weight and kidney function.  Likely that the change was recommended based on his most recent renal function.     Eliquis 2.5mg  BID sent to pharmacy.

## 2023-01-24 DIAGNOSIS — I83018 Varicose veins of right lower extremity with ulcer other part of lower leg: Secondary | ICD-10-CM | POA: Diagnosis not present

## 2023-01-24 DIAGNOSIS — J15212 Pneumonia due to Methicillin resistant Staphylococcus aureus: Secondary | ICD-10-CM | POA: Diagnosis not present

## 2023-01-24 DIAGNOSIS — L89320 Pressure ulcer of left buttock, unstageable: Secondary | ICD-10-CM | POA: Diagnosis not present

## 2023-01-24 DIAGNOSIS — A4102 Sepsis due to Methicillin resistant Staphylococcus aureus: Secondary | ICD-10-CM | POA: Diagnosis not present

## 2023-01-24 DIAGNOSIS — L89310 Pressure ulcer of right buttock, unstageable: Secondary | ICD-10-CM | POA: Diagnosis not present

## 2023-01-24 DIAGNOSIS — L89610 Pressure ulcer of right heel, unstageable: Secondary | ICD-10-CM | POA: Diagnosis not present

## 2023-01-26 DIAGNOSIS — D6869 Other thrombophilia: Secondary | ICD-10-CM | POA: Diagnosis not present

## 2023-01-26 DIAGNOSIS — L97518 Non-pressure chronic ulcer of other part of right foot with other specified severity: Secondary | ICD-10-CM | POA: Diagnosis not present

## 2023-01-26 DIAGNOSIS — J439 Emphysema, unspecified: Secondary | ICD-10-CM | POA: Diagnosis not present

## 2023-01-26 DIAGNOSIS — I272 Pulmonary hypertension, unspecified: Secondary | ICD-10-CM | POA: Diagnosis not present

## 2023-01-26 DIAGNOSIS — I5042 Chronic combined systolic (congestive) and diastolic (congestive) heart failure: Secondary | ICD-10-CM | POA: Diagnosis not present

## 2023-01-26 DIAGNOSIS — I83025 Varicose veins of left lower extremity with ulcer other part of foot: Secondary | ICD-10-CM | POA: Diagnosis not present

## 2023-01-26 DIAGNOSIS — L89320 Pressure ulcer of left buttock, unstageable: Secondary | ICD-10-CM | POA: Diagnosis not present

## 2023-01-26 DIAGNOSIS — N179 Acute kidney failure, unspecified: Secondary | ICD-10-CM | POA: Diagnosis not present

## 2023-01-26 DIAGNOSIS — J15212 Pneumonia due to Methicillin resistant Staphylococcus aureus: Secondary | ICD-10-CM | POA: Diagnosis not present

## 2023-01-26 DIAGNOSIS — L97818 Non-pressure chronic ulcer of other part of right lower leg with other specified severity: Secondary | ICD-10-CM | POA: Diagnosis not present

## 2023-01-26 DIAGNOSIS — N390 Urinary tract infection, site not specified: Secondary | ICD-10-CM | POA: Diagnosis not present

## 2023-01-26 DIAGNOSIS — I482 Chronic atrial fibrillation, unspecified: Secondary | ICD-10-CM | POA: Diagnosis not present

## 2023-01-26 DIAGNOSIS — I83015 Varicose veins of right lower extremity with ulcer other part of foot: Secondary | ICD-10-CM | POA: Diagnosis not present

## 2023-01-26 DIAGNOSIS — I83028 Varicose veins of left lower extremity with ulcer other part of lower leg: Secondary | ICD-10-CM | POA: Diagnosis not present

## 2023-01-26 DIAGNOSIS — N1832 Chronic kidney disease, stage 3b: Secondary | ICD-10-CM | POA: Diagnosis not present

## 2023-01-26 DIAGNOSIS — I130 Hypertensive heart and chronic kidney disease with heart failure and stage 1 through stage 4 chronic kidney disease, or unspecified chronic kidney disease: Secondary | ICD-10-CM | POA: Diagnosis not present

## 2023-01-26 DIAGNOSIS — L97528 Non-pressure chronic ulcer of other part of left foot with other specified severity: Secondary | ICD-10-CM | POA: Diagnosis not present

## 2023-01-26 DIAGNOSIS — I83018 Varicose veins of right lower extremity with ulcer other part of lower leg: Secondary | ICD-10-CM | POA: Diagnosis not present

## 2023-01-26 DIAGNOSIS — I700 Atherosclerosis of aorta: Secondary | ICD-10-CM | POA: Diagnosis not present

## 2023-01-26 DIAGNOSIS — D631 Anemia in chronic kidney disease: Secondary | ICD-10-CM | POA: Diagnosis not present

## 2023-01-26 DIAGNOSIS — L89610 Pressure ulcer of right heel, unstageable: Secondary | ICD-10-CM | POA: Diagnosis not present

## 2023-01-26 DIAGNOSIS — L89310 Pressure ulcer of right buttock, unstageable: Secondary | ICD-10-CM | POA: Diagnosis not present

## 2023-01-26 DIAGNOSIS — L97828 Non-pressure chronic ulcer of other part of left lower leg with other specified severity: Secondary | ICD-10-CM | POA: Diagnosis not present

## 2023-01-26 DIAGNOSIS — A4102 Sepsis due to Methicillin resistant Staphylococcus aureus: Secondary | ICD-10-CM | POA: Diagnosis not present

## 2023-01-29 ENCOUNTER — Other Ambulatory Visit: Payer: Self-pay | Admitting: Cardiothoracic Surgery

## 2023-01-29 DIAGNOSIS — A4102 Sepsis due to Methicillin resistant Staphylococcus aureus: Secondary | ICD-10-CM | POA: Diagnosis not present

## 2023-01-29 DIAGNOSIS — I83018 Varicose veins of right lower extremity with ulcer other part of lower leg: Secondary | ICD-10-CM | POA: Diagnosis not present

## 2023-01-29 DIAGNOSIS — I71019 Dissection of thoracic aorta, unspecified: Secondary | ICD-10-CM

## 2023-01-29 DIAGNOSIS — J15212 Pneumonia due to Methicillin resistant Staphylococcus aureus: Secondary | ICD-10-CM | POA: Diagnosis not present

## 2023-01-29 DIAGNOSIS — L89610 Pressure ulcer of right heel, unstageable: Secondary | ICD-10-CM | POA: Diagnosis not present

## 2023-01-29 DIAGNOSIS — L89320 Pressure ulcer of left buttock, unstageable: Secondary | ICD-10-CM | POA: Diagnosis not present

## 2023-01-29 DIAGNOSIS — L89310 Pressure ulcer of right buttock, unstageable: Secondary | ICD-10-CM | POA: Diagnosis not present

## 2023-01-31 DIAGNOSIS — J15212 Pneumonia due to Methicillin resistant Staphylococcus aureus: Secondary | ICD-10-CM | POA: Diagnosis not present

## 2023-01-31 DIAGNOSIS — L89320 Pressure ulcer of left buttock, unstageable: Secondary | ICD-10-CM | POA: Diagnosis not present

## 2023-01-31 DIAGNOSIS — A4102 Sepsis due to Methicillin resistant Staphylococcus aureus: Secondary | ICD-10-CM | POA: Diagnosis not present

## 2023-01-31 DIAGNOSIS — L89310 Pressure ulcer of right buttock, unstageable: Secondary | ICD-10-CM | POA: Diagnosis not present

## 2023-01-31 DIAGNOSIS — I83018 Varicose veins of right lower extremity with ulcer other part of lower leg: Secondary | ICD-10-CM | POA: Diagnosis not present

## 2023-01-31 DIAGNOSIS — L89610 Pressure ulcer of right heel, unstageable: Secondary | ICD-10-CM | POA: Diagnosis not present

## 2023-02-01 DIAGNOSIS — L89310 Pressure ulcer of right buttock, unstageable: Secondary | ICD-10-CM | POA: Diagnosis not present

## 2023-02-01 DIAGNOSIS — L89610 Pressure ulcer of right heel, unstageable: Secondary | ICD-10-CM | POA: Diagnosis not present

## 2023-02-01 DIAGNOSIS — A4102 Sepsis due to Methicillin resistant Staphylococcus aureus: Secondary | ICD-10-CM | POA: Diagnosis not present

## 2023-02-01 DIAGNOSIS — I83018 Varicose veins of right lower extremity with ulcer other part of lower leg: Secondary | ICD-10-CM | POA: Diagnosis not present

## 2023-02-01 DIAGNOSIS — J15212 Pneumonia due to Methicillin resistant Staphylococcus aureus: Secondary | ICD-10-CM | POA: Diagnosis not present

## 2023-02-01 DIAGNOSIS — L89320 Pressure ulcer of left buttock, unstageable: Secondary | ICD-10-CM | POA: Diagnosis not present

## 2023-02-02 DIAGNOSIS — M79674 Pain in right toe(s): Secondary | ICD-10-CM | POA: Diagnosis not present

## 2023-02-02 DIAGNOSIS — S91109D Unspecified open wound of unspecified toe(s) without damage to nail, subsequent encounter: Secondary | ICD-10-CM | POA: Diagnosis not present

## 2023-02-02 DIAGNOSIS — I739 Peripheral vascular disease, unspecified: Secondary | ICD-10-CM | POA: Diagnosis not present

## 2023-02-02 DIAGNOSIS — M79675 Pain in left toe(s): Secondary | ICD-10-CM | POA: Diagnosis not present

## 2023-02-02 DIAGNOSIS — M869 Osteomyelitis, unspecified: Secondary | ICD-10-CM | POA: Diagnosis not present

## 2023-02-02 DIAGNOSIS — L0390 Cellulitis, unspecified: Secondary | ICD-10-CM | POA: Diagnosis not present

## 2023-02-05 ENCOUNTER — Encounter: Payer: Medicare Other | Admitting: Physical Medicine and Rehabilitation

## 2023-02-05 DIAGNOSIS — A4102 Sepsis due to Methicillin resistant Staphylococcus aureus: Secondary | ICD-10-CM | POA: Diagnosis not present

## 2023-02-05 DIAGNOSIS — L89310 Pressure ulcer of right buttock, unstageable: Secondary | ICD-10-CM | POA: Diagnosis not present

## 2023-02-05 DIAGNOSIS — I83018 Varicose veins of right lower extremity with ulcer other part of lower leg: Secondary | ICD-10-CM | POA: Diagnosis not present

## 2023-02-05 DIAGNOSIS — L89320 Pressure ulcer of left buttock, unstageable: Secondary | ICD-10-CM | POA: Diagnosis not present

## 2023-02-05 DIAGNOSIS — J15212 Pneumonia due to Methicillin resistant Staphylococcus aureus: Secondary | ICD-10-CM | POA: Diagnosis not present

## 2023-02-05 DIAGNOSIS — L89610 Pressure ulcer of right heel, unstageable: Secondary | ICD-10-CM | POA: Diagnosis not present

## 2023-02-06 ENCOUNTER — Other Ambulatory Visit: Payer: Self-pay | Admitting: Interventional Cardiology

## 2023-02-06 DIAGNOSIS — J15212 Pneumonia due to Methicillin resistant Staphylococcus aureus: Secondary | ICD-10-CM | POA: Diagnosis not present

## 2023-02-06 DIAGNOSIS — A4102 Sepsis due to Methicillin resistant Staphylococcus aureus: Secondary | ICD-10-CM | POA: Diagnosis not present

## 2023-02-06 DIAGNOSIS — L89320 Pressure ulcer of left buttock, unstageable: Secondary | ICD-10-CM | POA: Diagnosis not present

## 2023-02-06 DIAGNOSIS — L89310 Pressure ulcer of right buttock, unstageable: Secondary | ICD-10-CM | POA: Diagnosis not present

## 2023-02-06 DIAGNOSIS — I83018 Varicose veins of right lower extremity with ulcer other part of lower leg: Secondary | ICD-10-CM | POA: Diagnosis not present

## 2023-02-06 DIAGNOSIS — L89610 Pressure ulcer of right heel, unstageable: Secondary | ICD-10-CM | POA: Diagnosis not present

## 2023-02-06 NOTE — Telephone Encounter (Signed)
*  STAT* If patient is at the pharmacy, call can be transferred to refill team.   1. Which medications need to be refilled? (please list name of each medication and dose if known) midodrine (PROAMATINE) 10 MG tablet    2. Would you like to learn more about the convenience, safety, & potential cost savings by using the [facility]? No      3. Are you open to using the [facility] (Type [facility]. No ).   4. Which pharmacy/location (including street and city if local pharmacy) is medication to be sent to? [facility] - [place], [place] - [address]    5. Do they need a 30 day or 90 day supply? 30

## 2023-02-06 NOTE — Telephone Encounter (Signed)
Pt is requesting a refill on midodrine. [name] did not prescribe this medication. Would [name] like to refill this medication? Please address

## 2023-02-07 ENCOUNTER — Telehealth: Payer: Self-pay | Admitting: Interventional Cardiology

## 2023-02-07 ENCOUNTER — Telehealth: Payer: Self-pay

## 2023-02-07 MED ORDER — MIDODRINE HCL 10 MG PO TABS: 10.0000 mg | ORAL_TABLET | Freq: Three times a day (TID) | ORAL | 3 refills | Status: DC

## 2023-02-07 NOTE — Telephone Encounter (Signed)
See refill request for order of medication that [name] approved to refill.

## 2023-02-07 NOTE — Telephone Encounter (Signed)
*  STAT* If patient is at the pharmacy, call can be transferred to refill team.   1. Which medications need to be refilled? (please list name of each medication and dose if known)   midodrine (PROAMATINE) 10 MG tablet    2. Which pharmacy/location (including street and city if local pharmacy) is medication to be sent to?  [id number] - [place], [place] - [address]      3. Do they need a 30 day or 90 day supply? 90 day    Pt is completely out of medication

## 2023-02-07 NOTE — Telephone Encounter (Signed)
Dr. Marylene Land with [facility] called to discuss a wound culture done on the patient with Dr. Drue Second. She has also faxed culture results, which I will have them scanned in the patient's chart.  Dr. Drue Second is currently out of the office and I have has [name] review the culture since she has seen the patient.  Per [name] patient will need Levaquin 500 mg every day for 7 days if its a superficial wound infection.  I have relay orders to Dr. Marylene Land and she will work on ordering it for the patient.  [name] T [name]

## 2023-02-07 NOTE — Telephone Encounter (Signed)
OK to refill at his current dose.

## 2023-02-07 NOTE — Telephone Encounter (Signed)
[name] is okay with refilling patient's midodrine. Made patient an appointment to follow-up with Dr. Anne Fu as his new cardiologist.

## 2023-02-08 DIAGNOSIS — I83018 Varicose veins of right lower extremity with ulcer other part of lower leg: Secondary | ICD-10-CM | POA: Diagnosis not present

## 2023-02-08 DIAGNOSIS — A4102 Sepsis due to Methicillin resistant Staphylococcus aureus: Secondary | ICD-10-CM | POA: Diagnosis not present

## 2023-02-08 DIAGNOSIS — L89610 Pressure ulcer of right heel, unstageable: Secondary | ICD-10-CM | POA: Diagnosis not present

## 2023-02-08 DIAGNOSIS — L89320 Pressure ulcer of left buttock, unstageable: Secondary | ICD-10-CM | POA: Diagnosis not present

## 2023-02-08 DIAGNOSIS — J15212 Pneumonia due to Methicillin resistant Staphylococcus aureus: Secondary | ICD-10-CM | POA: Diagnosis not present

## 2023-02-08 DIAGNOSIS — L89310 Pressure ulcer of right buttock, unstageable: Secondary | ICD-10-CM | POA: Diagnosis not present

## 2023-02-11 ENCOUNTER — Emergency Department (HOSPITAL_COMMUNITY): Payer: Medicare Other

## 2023-02-11 ENCOUNTER — Encounter (HOSPITAL_COMMUNITY): Payer: Self-pay | Admitting: Internal Medicine

## 2023-02-11 ENCOUNTER — Inpatient Hospital Stay (HOSPITAL_COMMUNITY): Payer: Medicare Other

## 2023-02-11 ENCOUNTER — Other Ambulatory Visit: Payer: Self-pay

## 2023-02-11 ENCOUNTER — Other Ambulatory Visit (HOSPITAL_COMMUNITY): Payer: Medicare Other

## 2023-02-11 ENCOUNTER — Inpatient Hospital Stay (HOSPITAL_COMMUNITY)
Admission: EM | Admit: 2023-02-11 | Discharge: 2023-02-16 | DRG: 853 | Disposition: A | Discharge status: Hospice | Payer: Medicare Other | Attending: Internal Medicine | Admitting: Internal Medicine

## 2023-02-11 DIAGNOSIS — I517 Cardiomegaly: Secondary | ICD-10-CM | POA: Diagnosis not present

## 2023-02-11 DIAGNOSIS — Z1152 Encounter for screening for COVID-19: Secondary | ICD-10-CM | POA: Diagnosis not present

## 2023-02-11 DIAGNOSIS — Z87440 Personal history of urinary (tract) infections: Secondary | ICD-10-CM

## 2023-02-11 DIAGNOSIS — Z79899 Other long term (current) drug therapy: Secondary | ICD-10-CM

## 2023-02-11 DIAGNOSIS — G9341 Metabolic encephalopathy: Secondary | ICD-10-CM | POA: Diagnosis present

## 2023-02-11 DIAGNOSIS — M00272 Other streptococcal arthritis, left ankle and foot: Secondary | ICD-10-CM | POA: Diagnosis present

## 2023-02-11 DIAGNOSIS — E875 Hyperkalemia: Secondary | ICD-10-CM | POA: Diagnosis not present

## 2023-02-11 DIAGNOSIS — R7881 Bacteremia: Secondary | ICD-10-CM | POA: Diagnosis not present

## 2023-02-11 DIAGNOSIS — Z66 Do not resuscitate: Secondary | ICD-10-CM | POA: Diagnosis present

## 2023-02-11 DIAGNOSIS — I2510 Atherosclerotic heart disease of native coronary artery without angina pectoris: Secondary | ICD-10-CM | POA: Diagnosis present

## 2023-02-11 DIAGNOSIS — Z6823 Body mass index (BMI) 23.0-23.9, adult: Secondary | ICD-10-CM

## 2023-02-11 DIAGNOSIS — R5383 Other fatigue: Secondary | ICD-10-CM | POA: Diagnosis not present

## 2023-02-11 DIAGNOSIS — M8580 Other specified disorders of bone density and structure, unspecified site: Secondary | ICD-10-CM | POA: Diagnosis present

## 2023-02-11 DIAGNOSIS — K5730 Diverticulosis of large intestine without perforation or abscess without bleeding: Secondary | ICD-10-CM | POA: Diagnosis not present

## 2023-02-11 DIAGNOSIS — K838 Other specified diseases of biliary tract: Secondary | ICD-10-CM | POA: Diagnosis not present

## 2023-02-11 DIAGNOSIS — M7732 Calcaneal spur, left foot: Secondary | ICD-10-CM | POA: Diagnosis not present

## 2023-02-11 DIAGNOSIS — M009 Pyogenic arthritis, unspecified: Secondary | ICD-10-CM

## 2023-02-11 DIAGNOSIS — E8809 Other disorders of plasma-protein metabolism, not elsewhere classified: Secondary | ICD-10-CM | POA: Diagnosis present

## 2023-02-11 DIAGNOSIS — Z952 Presence of prosthetic heart valve: Secondary | ICD-10-CM

## 2023-02-11 DIAGNOSIS — I5042 Chronic combined systolic (congestive) and diastolic (congestive) heart failure: Secondary | ICD-10-CM | POA: Diagnosis not present

## 2023-02-11 DIAGNOSIS — A4102 Sepsis due to Methicillin resistant Staphylococcus aureus: Secondary | ICD-10-CM

## 2023-02-11 DIAGNOSIS — K7589 Other specified inflammatory liver diseases: Secondary | ICD-10-CM | POA: Diagnosis not present

## 2023-02-11 DIAGNOSIS — Z8614 Personal history of Methicillin resistant Staphylococcus aureus infection: Secondary | ICD-10-CM | POA: Diagnosis not present

## 2023-02-11 DIAGNOSIS — I9589 Other hypotension: Secondary | ICD-10-CM | POA: Diagnosis present

## 2023-02-11 DIAGNOSIS — D539 Nutritional anemia, unspecified: Secondary | ICD-10-CM | POA: Diagnosis present

## 2023-02-11 DIAGNOSIS — N400 Enlarged prostate without lower urinary tract symptoms: Secondary | ICD-10-CM | POA: Diagnosis not present

## 2023-02-11 DIAGNOSIS — Z515 Encounter for palliative care: Secondary | ICD-10-CM

## 2023-02-11 DIAGNOSIS — J9601 Acute respiratory failure with hypoxia: Secondary | ICD-10-CM | POA: Insufficient documentation

## 2023-02-11 DIAGNOSIS — Z955 Presence of coronary angioplasty implant and graft: Secondary | ICD-10-CM

## 2023-02-11 DIAGNOSIS — Z953 Presence of xenogenic heart valve: Secondary | ICD-10-CM | POA: Diagnosis not present

## 2023-02-11 DIAGNOSIS — R7401 Elevation of levels of liver transaminase levels: Secondary | ICD-10-CM | POA: Diagnosis present

## 2023-02-11 DIAGNOSIS — Z888 Allergy status to other drugs, medicaments and biological substances status: Secondary | ICD-10-CM

## 2023-02-11 DIAGNOSIS — Z96653 Presence of artificial knee joint, bilateral: Secondary | ICD-10-CM | POA: Diagnosis present

## 2023-02-11 DIAGNOSIS — I5032 Chronic diastolic (congestive) heart failure: Secondary | ICD-10-CM | POA: Diagnosis present

## 2023-02-11 DIAGNOSIS — L89152 Pressure ulcer of sacral region, stage 2: Secondary | ICD-10-CM | POA: Diagnosis not present

## 2023-02-11 DIAGNOSIS — I998 Other disorder of circulatory system: Secondary | ICD-10-CM | POA: Diagnosis not present

## 2023-02-11 DIAGNOSIS — B951 Streptococcus, group B, as the cause of diseases classified elsewhere: Secondary | ICD-10-CM | POA: Diagnosis present

## 2023-02-11 DIAGNOSIS — I10 Essential (primary) hypertension: Secondary | ICD-10-CM | POA: Diagnosis not present

## 2023-02-11 DIAGNOSIS — R0682 Tachypnea, not elsewhere classified: Secondary | ICD-10-CM | POA: Diagnosis not present

## 2023-02-11 DIAGNOSIS — R7989 Other specified abnormal findings of blood chemistry: Secondary | ICD-10-CM | POA: Diagnosis not present

## 2023-02-11 DIAGNOSIS — E440 Moderate protein-calorie malnutrition: Secondary | ICD-10-CM | POA: Diagnosis present

## 2023-02-11 DIAGNOSIS — R4182 Altered mental status, unspecified: Secondary | ICD-10-CM | POA: Diagnosis not present

## 2023-02-11 DIAGNOSIS — Z87891 Personal history of nicotine dependence: Secondary | ICD-10-CM

## 2023-02-11 DIAGNOSIS — A419 Sepsis, unspecified organism: Secondary | ICD-10-CM | POA: Diagnosis present

## 2023-02-11 DIAGNOSIS — R112 Nausea with vomiting, unspecified: Secondary | ICD-10-CM | POA: Diagnosis not present

## 2023-02-11 DIAGNOSIS — J90 Pleural effusion, not elsewhere classified: Secondary | ICD-10-CM | POA: Diagnosis not present

## 2023-02-11 DIAGNOSIS — Z8249 Family history of ischemic heart disease and other diseases of the circulatory system: Secondary | ICD-10-CM

## 2023-02-11 DIAGNOSIS — I509 Heart failure, unspecified: Secondary | ICD-10-CM | POA: Diagnosis not present

## 2023-02-11 DIAGNOSIS — K828 Other specified diseases of gallbladder: Secondary | ICD-10-CM | POA: Diagnosis not present

## 2023-02-11 DIAGNOSIS — Z961 Presence of intraocular lens: Secondary | ICD-10-CM | POA: Diagnosis present

## 2023-02-11 DIAGNOSIS — R110 Nausea: Secondary | ICD-10-CM | POA: Diagnosis not present

## 2023-02-11 DIAGNOSIS — I4821 Permanent atrial fibrillation: Secondary | ICD-10-CM | POA: Diagnosis present

## 2023-02-11 DIAGNOSIS — Z885 Allergy status to narcotic agent status: Secondary | ICD-10-CM

## 2023-02-11 DIAGNOSIS — Z7901 Long term (current) use of anticoagulants: Secondary | ICD-10-CM

## 2023-02-11 DIAGNOSIS — E162 Hypoglycemia, unspecified: Secondary | ICD-10-CM | POA: Diagnosis present

## 2023-02-11 DIAGNOSIS — I959 Hypotension, unspecified: Secondary | ICD-10-CM | POA: Diagnosis not present

## 2023-02-11 DIAGNOSIS — Z9841 Cataract extraction status, right eye: Secondary | ICD-10-CM

## 2023-02-11 DIAGNOSIS — I110 Hypertensive heart disease with heart failure: Secondary | ICD-10-CM | POA: Diagnosis not present

## 2023-02-11 DIAGNOSIS — I4891 Unspecified atrial fibrillation: Secondary | ICD-10-CM | POA: Diagnosis not present

## 2023-02-11 DIAGNOSIS — R627 Adult failure to thrive: Secondary | ICD-10-CM | POA: Diagnosis present

## 2023-02-11 DIAGNOSIS — I739 Peripheral vascular disease, unspecified: Secondary | ICD-10-CM | POA: Diagnosis present

## 2023-02-11 DIAGNOSIS — R55 Syncope and collapse: Secondary | ICD-10-CM | POA: Diagnosis not present

## 2023-02-11 DIAGNOSIS — E872 Acidosis, unspecified: Secondary | ICD-10-CM | POA: Diagnosis present

## 2023-02-11 DIAGNOSIS — M25472 Effusion, left ankle: Secondary | ICD-10-CM | POA: Diagnosis present

## 2023-02-11 DIAGNOSIS — N179 Acute kidney failure, unspecified: Secondary | ICD-10-CM | POA: Diagnosis present

## 2023-02-11 DIAGNOSIS — M85872 Other specified disorders of bone density and structure, left ankle and foot: Secondary | ICD-10-CM | POA: Diagnosis not present

## 2023-02-11 DIAGNOSIS — D62 Acute posthemorrhagic anemia: Secondary | ICD-10-CM | POA: Diagnosis not present

## 2023-02-11 DIAGNOSIS — R531 Weakness: Secondary | ICD-10-CM | POA: Diagnosis not present

## 2023-02-11 DIAGNOSIS — R54 Age-related physical debility: Secondary | ICD-10-CM | POA: Diagnosis present

## 2023-02-11 DIAGNOSIS — L97519 Non-pressure chronic ulcer of other part of right foot with unspecified severity: Secondary | ICD-10-CM | POA: Diagnosis present

## 2023-02-11 DIAGNOSIS — R6520 Severe sepsis without septic shock: Secondary | ICD-10-CM | POA: Diagnosis present

## 2023-02-11 DIAGNOSIS — R404 Transient alteration of awareness: Secondary | ICD-10-CM | POA: Diagnosis not present

## 2023-02-11 DIAGNOSIS — M00872 Arthritis due to other bacteria, left ankle and foot: Secondary | ICD-10-CM | POA: Diagnosis not present

## 2023-02-11 DIAGNOSIS — R1111 Vomiting without nausea: Secondary | ICD-10-CM | POA: Diagnosis not present

## 2023-02-11 DIAGNOSIS — R600 Localized edema: Secondary | ICD-10-CM | POA: Diagnosis not present

## 2023-02-11 DIAGNOSIS — Z8679 Personal history of other diseases of the circulatory system: Secondary | ICD-10-CM

## 2023-02-11 DIAGNOSIS — Z7982 Long term (current) use of aspirin: Secondary | ICD-10-CM

## 2023-02-11 DIAGNOSIS — E785 Hyperlipidemia, unspecified: Secondary | ICD-10-CM | POA: Diagnosis not present

## 2023-02-11 DIAGNOSIS — I340 Nonrheumatic mitral (valve) insufficiency: Secondary | ICD-10-CM | POA: Diagnosis not present

## 2023-02-11 DIAGNOSIS — R918 Other nonspecific abnormal finding of lung field: Secondary | ICD-10-CM | POA: Diagnosis not present

## 2023-02-11 DIAGNOSIS — I130 Hypertensive heart and chronic kidney disease with heart failure and stage 1 through stage 4 chronic kidney disease, or unspecified chronic kidney disease: Secondary | ICD-10-CM | POA: Diagnosis present

## 2023-02-11 DIAGNOSIS — R188 Other ascites: Secondary | ICD-10-CM | POA: Diagnosis not present

## 2023-02-11 DIAGNOSIS — S2241XA Multiple fractures of ribs, right side, initial encounter for closed fracture: Secondary | ICD-10-CM | POA: Diagnosis not present

## 2023-02-11 DIAGNOSIS — S86212A Strain of muscle(s) and tendon(s) of anterior muscle group at lower leg level, left leg, initial encounter: Secondary | ICD-10-CM | POA: Diagnosis not present

## 2023-02-11 DIAGNOSIS — Z9842 Cataract extraction status, left eye: Secondary | ICD-10-CM

## 2023-02-11 DIAGNOSIS — K8020 Calculus of gallbladder without cholecystitis without obstruction: Secondary | ICD-10-CM | POA: Diagnosis not present

## 2023-02-11 DIAGNOSIS — Z8379 Family history of other diseases of the digestive system: Secondary | ICD-10-CM

## 2023-02-11 DIAGNOSIS — I081 Rheumatic disorders of both mitral and tricuspid valves: Secondary | ICD-10-CM | POA: Diagnosis present

## 2023-02-11 DIAGNOSIS — R0602 Shortness of breath: Secondary | ICD-10-CM | POA: Diagnosis not present

## 2023-02-11 DIAGNOSIS — D649 Anemia, unspecified: Secondary | ICD-10-CM | POA: Diagnosis present

## 2023-02-11 LAB — CBC WITH DIFFERENTIAL/PLATELET
Abs Immature Granulocytes: 0.08 10*3/uL — ABNORMAL HIGH (ref 0.00–0.07)
Basophils Absolute: 0 10*3/uL (ref 0.0–0.1)
Basophils Relative: 0 %
Eosinophils Absolute: 0 10*3/uL (ref 0.0–0.5)
Eosinophils Relative: 0 %
HCT: 29.1 % — ABNORMAL LOW (ref 39.0–52.0)
Hemoglobin: 8.3 g/dL — ABNORMAL LOW (ref 13.0–17.0)
Immature Granulocytes: 1 %
Lymphocytes Relative: 4 %
Lymphs Abs: 0.4 10*3/uL — ABNORMAL LOW (ref 0.7–4.0)
MCH: 30.6 pg (ref 26.0–34.0)
MCHC: 28.5 g/dL — ABNORMAL LOW (ref 30.0–36.0)
MCV: 107.4 fL — ABNORMAL HIGH (ref 80.0–100.0)
Monocytes Absolute: 0.4 10*3/uL (ref 0.1–1.0)
Monocytes Relative: 5 %
Neutro Abs: 7.9 10*3/uL — ABNORMAL HIGH (ref 1.7–7.7)
Neutrophils Relative %: 90 %
Platelets: 284 10*3/uL (ref 150–400)
RBC: 2.71 MIL/uL — ABNORMAL LOW (ref 4.22–5.81)
RDW: 24.3 % — ABNORMAL HIGH (ref 11.5–15.5)
Smear Review: NORMAL
WBC: 8.8 10*3/uL (ref 4.0–10.5)
nRBC: 0.2 % (ref 0.0–0.2)

## 2023-02-11 LAB — GLUCOSE, CAPILLARY
Glucose-Capillary: 118 mg/dL — ABNORMAL HIGH (ref 70–99)
Glucose-Capillary: 140 mg/dL — ABNORMAL HIGH (ref 70–99)
Glucose-Capillary: 152 mg/dL — ABNORMAL HIGH (ref 70–99)
Glucose-Capillary: 50 mg/dL — ABNORMAL LOW (ref 70–99)
Glucose-Capillary: 86 mg/dL (ref 70–99)
Glucose-Capillary: 89 mg/dL (ref 70–99)

## 2023-02-11 LAB — URINALYSIS, W/ REFLEX TO CULTURE (INFECTION SUSPECTED)
Bacteria, UA: NONE SEEN
Bilirubin Urine: NEGATIVE
Glucose, UA: NEGATIVE mg/dL
Hgb urine dipstick: NEGATIVE
Ketones, ur: NEGATIVE mg/dL
Leukocytes,Ua: NEGATIVE
Nitrite: NEGATIVE
Protein, ur: 100 mg/dL — AB
Specific Gravity, Urine: 1.017 (ref 1.005–1.030)
pH: 5 (ref 5.0–8.0)

## 2023-02-11 LAB — COMPREHENSIVE METABOLIC PANEL
ALT: 157 U/L — ABNORMAL HIGH (ref 0–44)
AST: 319 U/L — ABNORMAL HIGH (ref 15–41)
Albumin: 2.5 g/dL — ABNORMAL LOW (ref 3.5–5.0)
Alkaline Phosphatase: 187 U/L — ABNORMAL HIGH (ref 38–126)
Anion gap: 10 (ref 5–15)
BUN: 56 mg/dL — ABNORMAL HIGH (ref 8–23)
CO2: 19 mmol/L — ABNORMAL LOW (ref 22–32)
Calcium: 8.9 mg/dL (ref 8.9–10.3)
Chloride: 115 mmol/L — ABNORMAL HIGH (ref 98–111)
Creatinine, Ser: 2.17 mg/dL — ABNORMAL HIGH (ref 0.61–1.24)
GFR, Estimated: 29 mL/min — ABNORMAL LOW (ref >60)
Glucose, Bld: 112 mg/dL — ABNORMAL HIGH (ref 70–99)
Potassium: 6.6 mmol/L (ref 3.5–5.1)
Sodium: 144 mmol/L (ref 135–145)
Total Bilirubin: 1.3 mg/dL — ABNORMAL HIGH (ref 0.3–1.2)
Total Protein: 6.4 g/dL — ABNORMAL LOW (ref 6.5–8.1)

## 2023-02-11 LAB — SYNOVIAL CELL COUNT + DIFF, W/ CRYSTALS
Crystals, Fluid: NONE SEEN
Eosinophils-Synovial: 0 % (ref 0–1)
Lymphocytes-Synovial Fld: 0 % (ref 0–20)
Monocyte-Macrophage-Synovial Fluid: 5 % — ABNORMAL LOW (ref 50–90)
Neutrophil, Synovial: 95 % — ABNORMAL HIGH (ref 0–25)
WBC, Synovial: UNDETERMINED /mm3 (ref 0–200)

## 2023-02-11 LAB — I-STAT CG4 LACTIC ACID, ED
Lactic Acid, Venous: 2.9 mmol/L (ref 0.5–1.9)
Lactic Acid, Venous: 2.9 mmol/L (ref 0.5–1.9)

## 2023-02-11 LAB — TSH: TSH: 6.904 u[IU]/mL — ABNORMAL HIGH (ref 0.350–4.500)

## 2023-02-11 LAB — PROTIME-INR
INR: 2.5 — ABNORMAL HIGH (ref 0.8–1.2)
Prothrombin Time: 27.6 seconds — ABNORMAL HIGH (ref 11.4–15.2)

## 2023-02-11 LAB — POTASSIUM
Potassium: 6.1 mmol/L — ABNORMAL HIGH (ref 3.5–5.1)
Potassium: 6.1 mmol/L — ABNORMAL HIGH (ref 3.5–5.1)
Potassium: 5.6 mmol/L — ABNORMAL HIGH (ref 3.5–5.1)

## 2023-02-11 LAB — SARS CORONAVIRUS 2 BY RT PCR: SARS Coronavirus 2 by RT PCR: NEGATIVE

## 2023-02-11 LAB — MRSA NEXT GEN BY PCR, NASAL: MRSA by PCR Next Gen: NOT DETECTED

## 2023-02-11 LAB — APTT: aPTT: 39 seconds — ABNORMAL HIGH (ref 24–36)

## 2023-02-11 MED ORDER — DEXTROSE IN LACTATED RINGERS 5 % IV SOLN: INTRAVENOUS | Status: AC

## 2023-02-11 MED ORDER — ONDANSETRON HCL 4 MG PO TABS: 4.0000 mg | ORAL_TABLET | Freq: Every day | ORAL | Status: DC | PRN

## 2023-02-11 MED ORDER — VANCOMYCIN HCL IN DEXTROSE 1-5 GM/200ML-% IV SOLN
1000.0000 mg | INTRAVENOUS | Status: DC
  Administered 2023-02-12: 1000 mg via INTRAVENOUS
  Filled 2023-02-11: qty 200

## 2023-02-11 MED ORDER — TRAMADOL HCL 50 MG PO TABS
50.0000 mg | ORAL_TABLET | Freq: Four times a day (QID) | ORAL | Status: DC | PRN
  Administered 2023-02-11: 50 mg via ORAL
  Filled 2023-02-11: qty 1

## 2023-02-11 MED ORDER — GERHARDT'S BUTT CREAM
TOPICAL_CREAM | Freq: Two times a day (BID) | CUTANEOUS | Status: DC
  Filled 2023-02-11: qty 1

## 2023-02-11 MED ORDER — METRONIDAZOLE 500 MG/100ML IV SOLN
500.0000 mg | Freq: Two times a day (BID) | INTRAVENOUS | Status: DC
  Administered 2023-02-11 – 2023-02-14 (×6): 500 mg via INTRAVENOUS
  Filled 2023-02-11 (×6): qty 100

## 2023-02-11 MED ORDER — CALCIUM GLUCONATE-NACL 1-0.675 GM/50ML-% IV SOLN
1.0000 g | Freq: Once | INTRAVENOUS | Status: AC
  Administered 2023-02-11: 1000 mg via INTRAVENOUS
  Filled 2023-02-11: qty 50

## 2023-02-11 MED ORDER — DEXTROSE 50 % IV SOLN
1.0000 | Freq: Once | INTRAVENOUS | Status: AC
  Administered 2023-02-11: 50 mL via INTRAVENOUS
  Filled 2023-02-11: qty 50

## 2023-02-11 MED ORDER — MEGESTROL ACETATE 40 MG PO TABS
40.0000 mg | ORAL_TABLET | Freq: Two times a day (BID) | ORAL | Status: DC
  Administered 2023-02-11 – 2023-02-13 (×6): 40 mg via ORAL
  Filled 2023-02-11 (×8): qty 1

## 2023-02-11 MED ORDER — TRIMETHOBENZAMIDE HCL 100 MG/ML IM SOLN
200.0000 mg | Freq: Three times a day (TID) | INTRAMUSCULAR | Status: DC | PRN
  Administered 2023-02-13: 200 mg via INTRAMUSCULAR
  Filled 2023-02-11 (×2): qty 2

## 2023-02-11 MED ORDER — ACETAMINOPHEN 500 MG PO TABS: 500.0000 mg | ORAL_TABLET | Freq: Four times a day (QID) | ORAL | Status: DC | PRN

## 2023-02-11 MED ORDER — PANTOPRAZOLE SODIUM 40 MG PO TBEC
40.0000 mg | DELAYED_RELEASE_TABLET | Freq: Every day | ORAL | Status: DC
  Administered 2023-02-12 – 2023-02-13 (×2): 40 mg via ORAL
  Filled 2023-02-11 (×2): qty 1

## 2023-02-11 MED ORDER — SODIUM ZIRCONIUM CYCLOSILICATE 10 G PO PACK
10.0000 g | PACK | Freq: Once | ORAL | Status: AC
  Administered 2023-02-11: 10 g via ORAL
  Filled 2023-02-11: qty 1

## 2023-02-11 MED ORDER — APIXABAN 2.5 MG PO TABS
2.5000 mg | ORAL_TABLET | Freq: Two times a day (BID) | ORAL | Status: DC
  Administered 2023-02-11 – 2023-02-13 (×5): 2.5 mg via ORAL
  Filled 2023-02-11 (×5): qty 1

## 2023-02-11 MED ORDER — DEXTROSE 50 % IV SOLN
25.0000 g | INTRAVENOUS | Status: AC
  Administered 2023-02-11: 25 g via INTRAVENOUS

## 2023-02-11 MED ORDER — SODIUM ZIRCONIUM CYCLOSILICATE 5 G PO PACK
5.0000 g | PACK | Freq: Once | ORAL | Status: AC
  Administered 2023-02-11: 5 g via ORAL
  Filled 2023-02-11: qty 1

## 2023-02-11 MED ORDER — SODIUM CHLORIDE 0.9 % IV SOLN
2.0000 g | INTRAVENOUS | Status: DC
  Administered 2023-02-12 – 2023-02-14 (×3): 2 g via INTRAVENOUS
  Filled 2023-02-11 (×3): qty 12.5

## 2023-02-11 MED ORDER — ASPIRIN 81 MG PO CHEW
81.0000 mg | CHEWABLE_TABLET | Freq: Every day | ORAL | Status: DC
  Administered 2023-02-12 – 2023-02-13 (×2): 81 mg via ORAL
  Filled 2023-02-11 (×2): qty 1

## 2023-02-11 MED ORDER — METRONIDAZOLE 500 MG/100ML IV SOLN
500.0000 mg | Freq: Once | INTRAVENOUS | Status: DC
  Filled 2023-02-11: qty 100

## 2023-02-11 MED ORDER — DEXTROSE 5 % IV SOLN: INTRAVENOUS | Status: DC

## 2023-02-11 MED ORDER — DEXTROSE 50 % IV SOLN
INTRAVENOUS | Status: AC
  Filled 2023-02-11: qty 50

## 2023-02-11 MED ORDER — MIDODRINE HCL 5 MG PO TABS
10.0000 mg | ORAL_TABLET | Freq: Three times a day (TID) | ORAL | Status: DC
  Administered 2023-02-11 – 2023-02-13 (×7): 10 mg via ORAL
  Filled 2023-02-11 (×7): qty 2

## 2023-02-11 MED ORDER — LACTATED RINGERS IV BOLUS
1000.0000 mL | Freq: Once | INTRAVENOUS | Status: AC
  Administered 2023-02-11: 1000 mL via INTRAVENOUS

## 2023-02-11 MED ORDER — ONDANSETRON HCL 4 MG/2ML IJ SOLN
4.0000 mg | Freq: Once | INTRAMUSCULAR | Status: AC
  Administered 2023-02-11: 4 mg via INTRAVENOUS
  Filled 2023-02-11: qty 2

## 2023-02-11 MED ORDER — INSULIN ASPART 100 UNIT/ML IV SOLN
10.0000 [IU] | Freq: Once | INTRAVENOUS | Status: AC
  Administered 2023-02-11: 10 [IU] via INTRAVENOUS

## 2023-02-11 MED ORDER — SODIUM CHLORIDE 0.9 % IV SOLN
2.0000 g | Freq: Once | INTRAVENOUS | Status: AC
  Administered 2023-02-11: 2 g via INTRAVENOUS
  Filled 2023-02-11: qty 12.5

## 2023-02-11 MED ORDER — VANCOMYCIN HCL 1250 MG/250ML IV SOLN
1250.0000 mg | Freq: Once | INTRAVENOUS | Status: AC
  Administered 2023-02-11: 1250 mg via INTRAVENOUS
  Filled 2023-02-11: qty 250

## 2023-02-11 MED ORDER — FUROSEMIDE 20 MG PO TABS: 20.0000 mg | ORAL_TABLET | Freq: Every day | ORAL | Status: DC | PRN

## 2023-02-11 MED ORDER — SENNA 8.6 MG PO TABS
1.0000 | ORAL_TABLET | Freq: Two times a day (BID) | ORAL | Status: DC
  Administered 2023-02-11 – 2023-02-13 (×5): 8.6 mg via ORAL
  Filled 2023-02-11 (×5): qty 1

## 2023-02-11 MED ORDER — AMIODARONE HCL 200 MG PO TABS
200.0000 mg | ORAL_TABLET | Freq: Every day | ORAL | Status: DC
  Administered 2023-02-11 – 2023-02-13 (×3): 200 mg via ORAL
  Filled 2023-02-11 (×3): qty 1

## 2023-02-11 NOTE — Hospital Course (Addendum)
Sick 82 year oold guys. Jusst in the hospital to be in MRSA bactermia. Peripherial vascular disease. Open fracture in toe  Coming in for failure to thrive. Was normal in the past. Tacypneic.   Worsening foot infection, on linezolid. No ovbious signs of osteo on XRAY.   Does he have endocarditis? Loud murmur of the mitral valve. See by palliative care.   Borad spectrum antibiotics.   HPI Patient interviewed with Wife and daughter at bedside who gave all the history.Per family, he came home from the hospital 5 days ago for MRSA pneumonia.He started having infected toes and was given Linezolid. He was doing well at home until he didn't want to eat anymore and started throwing up.Vomit was not bilious or bloody. Family is been giving him ensure and protein shakes.After coming home from the hospital, he was still coughing out clear phlegm. Didn't have fevers. He had nausea so his wife gave him Zofran but it make him constipated . His last BM was 2 days ago. Family said they had to force him to drink anything because he wouldn't eat or drink anything. He is been having issues with balance due to the toe infection for which he saw a podiatrist for. Pt is seen by home health twice a week and PT twice a week. Patient is very conversational at baseline but has been very sleepy for the past few days. Wife  helps him with medication. Says pt complains of hell pain at time. Uses hearing aids. Patient is arousable. Reports right heel pain  . Denies any belly pain   PCP Dr Maryjean Ka MD in [place]  [phone]  Medications Doxy BID Iron Amiodarone Midodrine Doxy 100 mg BID  Tamsolosin Eliquis 2.5 BID Pantoprazole - STOPPED cos it wasn't working Takes "prevecid"  Laxis 20 mg   PMHx Heart attack wit stent placement   Surgical Hx 2 AAA repair  Social Hx No alcohol or drug use. Used tobacco a little when he was young but hasbt smokes in years . Stay home with wife   8/26: Pt evaluated at  bedside, wife present. Pt reports trouble swallowing and reports nausea. Pt reports symptoms of fevers and chills. Wife reports he appears confused first thing in the morning. Pt is alert and oriented self, reports year 2014 and that he is [facility].   SLP eval is in. Stopped   Abd: No abdominal pain.   8/27: Pt evaluated at bedside, sleeping. Pt denies any chest pain or shortness of breath. Pt denied any abdominal pain. Pt denies any urinary symptoms. Pt reports keeping Ensure down. Pt is alert and oriented to self and location.   External cath.

## 2023-02-11 NOTE — Progress Notes (Deleted)
Date: 02/11/2023               Patient Name:  [name] [name] MRN: [id number]  DOB: [birth year] Age / Sex: 82 y.o., male   PCP: Street, [name], MD         Medical Service: Internal Medicine Teaching Service         Attending Physician: Dr. Inez Catalina, MD      First Contact: Tomie China, MD [phone]    Second Contact: Dr. 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 Afterwards, he was admitted to inpatient PM&R rehab (6/21 - 7/6) and discharged on 4-week linezolid/indefinite doxycycline prophylaxis forleft heel and toe ulcers . He presents with a ~1-week history of worsening p.o. intake and a 1-day history of nausea/vomiting.   On interview, wife [name]) and daughter [name]) are present.  Majority of history obtained from family.  Patient is lying in bed, minimally attentive to conversation.  Patient was discharged from CIR approximately 5 weeks ago, over which time he showed improvement in functional status.  However, about 1 week ago patient said he did not want to eat anymore and began refusing food.  Family also had difficulty getting him to drink his Ensure supplementation.  Denies fevers, chills.  Endorses productive cough with clear sputum.  Family also says that he he  has been tired over the past few days and that his condition worsened even further 24 hours ago, when he began experiencing nausea and vomiting. No increased work of breathing. Family denies hematemesis, coffee-ground emesis.  Patient was given a dose of Zofran at home for this yesterday 8/24, which reportedly caused constipation.  Last bowel movement 8/23.  No changes in stool frequency/color.  Patient is seen by home health once a week and PT/OT twice a week.  Regarding patient's toe wound, family says that he does not consent to toe amputation and that it would eventually detach naturally.  Has had extensive conversations with orthopedic surgery at previous visit.  Patient also has a sacral decubitus ulcer, which family describes as almost healed.  Wife manages medication, says he has been adherent.  Regarding CODE STATUS: Patient was made DNR/DNI at previous stay, family has previously spoken with palliative care.  However, patient would like all noninvasive treatments to continue, including antibiotics.  Medications Doxycyclin 100 mg twice daily Aspirin 81 mg Iron 325 mg daily Amiodarone 200 mg daily Midodrine 10 mg 3 times daily Tamsolosin 0.4 mg daily Eliquis 2.5 BID Lasix 20 mg daily  ED course:  BP: 106/58.  HR: 95.  RR: 18.  Afebrile.  Satting 95% on room air.  K: 6.1.  BUN: 56.  Creatinine: 2.17 (baseline: ~1.8).  WBC: 8.8.  Hemoglobin: 8.3.  Lactate: 2.9.  Blood cultures drawn.  ED imaging:  XR chest:  IMPRESSION:  1. Cardiomegaly and mild pulmonary vascular congestion without frank edema. 2. Left greater than right pleural effusions and bibasilar airspace disease, left greater than right. While this may represent atelectasis, infection is not excluded.  XR ankle:  IMPRESSION: 1. Soft tissue swelling and moderate-sized joint effusion without acute or focal osseous abnormality. 2. Plantar calcaneal spur. 3. Mild osteopenia.  EKG:  Atrial fibrillation.  QTc: 541.  ED  meds:  IV vancomycin 1250 mg x1 IV cefepime 2 g x1 IV Flagyl 500 mg x1  IV calcium gluconate IV Zofran 4 mg IV LR 1 L bolus  Meds:  No outpatient medications have been marked as taking for the 02/11/23 encounter [facility] Encounter).    Past Medical History  Past Surgical History:  Procedure Laterality Date   AORTIC VALVE REPLACEMENT  02/19/2009   AORTO-FEMORAL BYPASS GRAFT  03/2010   ascending aortic and arch aneurysm repair/notes 04/09/2009   CARDIAC VALVE REPLACEMENT     CAROTID-SUBCLAVIAN BYPASS GRAFT Left 08/26/2013   Procedure: BYPASS GRAFT CAROTID-SUBCLAVIAN;  Surgeon: Nada Libman, MD;  Location: [facility] OR;  Service: Vascular;  Laterality: Left;   CATARACT EXTRACTION W/ INTRAOCULAR LENS  IMPLANT, BILATERAL Bilateral    ENDOVASCULAR STENT INSERTION N/A 08/26/2013   Procedure:  THORACIC STENT GRAFT INSERTION;  Surgeon: Nada Libman, MD;  Location: [facility] OR;  Service: Vascular;  Laterality: N/A;   EYE SURGERY     INGUINAL HERNIA REPAIR Left 12/02/2019   Procedure: REPAIR LEFT INGUINAL HERNIA WITH MESH;  Surgeon: Violeta Gelinas, MD;  Location: [facility] OR;  Service: General;  Laterality: Left;   INGUINAL HERNIA REPAIR Left 12/18/2019   Procedure: LAPAROSCOPIC ASSISTED REPAIR OF RECURRENT INCARCERATED LEFT INGUINAL HERNIA WITH MESH; LAPAOSCOPIC REPAIR OF SEROSAL TEAR;  Surgeon: Gaynelle Adu, MD;  Location: [facility] OR;  Service: General;  Laterality: Left;   INSERTION OF MESH Left 12/02/2019   Procedure: INSERTION OF MESH;  Surgeon: Violeta Gelinas, MD;  Location: [facility] OR;  Service: General;  Laterality: Left;   JOINT REPLACEMENT     KNEE ARTHROSCOPY Bilateral    REPLACEMENT TOTAL KNEE Bilateral    right groin lymphocele Right 03/29/2009   RIGHT/LEFT HEART CATH AND CORONARY ANGIOGRAPHY N/A 10/06/2019   Procedure: RIGHT/LEFT HEART CATH AND CORONARY ANGIOGRAPHY;  Surgeon: Lyn Records, MD;  Location: [facility] INVASIVE CV LAB;  Service: Cardiovascular;  Laterality: N/A;   THORACENTESIS  2010 X 2    TONSILLECTOMY      Social:  Lives With: Wife Occupation: Retired Support: Home health once a week: PT/OT twice a week.  Level of Function: Does not meet ADLs or iADLs. PCP: Dr Maryjean Ka MD in [place]. Substances: None  Family History:   Denies family history of diabetes, stroke, and cancer.  Allergies: Allergies as of 02/11/2023 - Review Complete 02/11/2023  Allergen Reaction Noted   Lortab [hydrocodone-acetaminophen] Hives 05/30/2012   Morphine and codeine Hives 05/18/2011   Other Other (See Comments) 09/04/2019    Review of Systems: A complete ROS was negative except as per HPI.   OBJECTIVE:   Physical Exam: Blood pressure (!) 107/49, pulse 85, temperature (!) 97.4 F (36.3 C), resp. rate (!) 23, weight 163 lb 12.8 oz (74.3 kg), SpO2 100%.   Constitutional: Chronically ill-appearing man laying in bed under multiple blankets HEENT: Normocephalic, atraumatic Cardiovascular: Systolic murmur heard at fourth/fifth left intercostal space, S1, S2 no rubs no gallops. Respiratory: Slightly decreased lung sounds at bases.  Otherwise CTAB Abdominal: Bowel sounds present, no tenderness in 4 quadrants to soft  palpation. MSK: Bilateral lower extremities ischemic appearing, with multiple sores.  Right foot shows exposed bone on second digit.  Faint right dorsalis pedis pulse found on Doppler, no other lower extremity pulses palpated/observable by Doppler.  Strength grossly diminished throughout, requires assistance to sit up.  Strength is 3 out of 5 bilateral lower extremities.  Bandage over sacral decubitus ulcer.  Sensation intact.  No warmth noted. Neuro: Grossly intact Psych: Lethargic, soft voice, appropriate mood and affect, oriented to self, place, situation   Labs: CBC    Component Value Date/Time   WBC 8.8 02/11/2023 0757   RBC 2.71 (L) 02/11/2023 0757   HGB 8.3 (L) 02/11/2023 0757   HGB 9.4 (L) 06/23/2022 1617   HCT 29.1 (L) 02/11/2023 0757   HCT 30.3 (L)  06/23/2022 1617   PLT 284 02/11/2023 0757   PLT 258 06/23/2022 1617   MCV 107.4 (H) 02/11/2023 0757   MCV 97 06/23/2022 1617   MCH 30.6 02/11/2023 0757   MCHC 28.5 (L) 02/11/2023 0757   RDW 24.3 (H) 02/11/2023 0757   RDW 14.7 06/23/2022 1617   LYMPHSABS 0.4 (L) 02/11/2023 0757   MONOABS 0.4 02/11/2023 0757   EOSABS 0.0 02/11/2023 0757   BASOSABS 0.0 02/11/2023 0757     CMP     Component Value Date/Time   NA 144 02/11/2023 0757   NA 145 (H) 06/23/2022 1617   K 6.1 (H) 02/11/2023 0910   CL 115 (H) 02/11/2023 0757   CO2 19 (L) 02/11/2023 0757   GLUCOSE 112 (H) 02/11/2023 0757   BUN 56 (H) 02/11/2023 0757   BUN 21 06/23/2022 1617   CREATININE 2.17 (H) 02/11/2023 0757   CALCIUM 8.9 02/11/2023 0757   PROT 6.4 (L) 02/11/2023 0757   PROT 6.5 06/23/2022 1617   ALBUMIN 2.5 (L) 02/11/2023 0757   ALBUMIN 3.9 06/23/2022 1617   AST 319 (H) 02/11/2023 0757   ALT 157 (H) 02/11/2023 0757   ALKPHOS 187 (H) 02/11/2023 0757   BILITOT 1.3 (H) 02/11/2023 0757   BILITOT 0.3 06/23/2022 1617   GFRNONAA 29 (L) 02/11/2023 0757   GFRAA 60 07/13/2020 1103    Imaging:  No new imaging at this time.  ASSESSMENT & PLAN:   Assessment & Plan by Problem: Principal Problem:   Altered mental state   ZEESHAN EYERMAN is a 82 y.o. person living with a history of A-fib on Eliquis, recent MRSA pneumonia/bacteremia, just of heart failure, PAD with chronic lower extremity and sacral wounds, who presented with worsening p.o. intake, nausea and vomiting and admitted for suspected bacteremia on hospital day 0  #Concern for bacteremia of unknown etiology #PAD complicated by multiple chronic wounds (bilateral lower extremity, sacral) #Joint effusion of the left ankle #History of aortic valve replacement #History of mitral insufficiency #Failure to thrive #Macrocytic anemia #Lethargy #Nausea/vomiting Recent presentation of nausea, vomiting, lethargy and failure to thrive likely due to gram-negative  bacteremia secondary to one of his multiple open wounds (R second digit, sacral decubitus ulcer). Joint effusion of the left ankle presents likeliest source, MRI order placed.  Chest x-ray showed bilateral pleural effusions, unlikely primary source at this time as patient does not currently express significant respiratory symptoms.  Previous ABIs showed vascular insufficiency since the right second digit, making him a poor candidate for toe amputation.  Could consider BKA if perfusion permits, although per family patient is not amenable to amputation at this time.  Patient also has a history of aortic valve replacement and mitral insufficiency, increasing  risk that these were seeded by his previous MRSA bacteremia.  Will follow up with TTE as patient is unlikely to tolerate TEE. Blood cultures drawn.  Begun on broad-spectrum IV antibiotics.  Wound care consult placed. - Ordered additional imaging: Echocardiogram, MRI left ankle without contrast. - Consult ortho for further study/aspiration of joint effusion, currently following. - Consult placed to ID for antibiotic management, appreciate recs. - Consult placed to wound care, appreciate recs. - Continue IV cefepime 2 g daily (8/25 -). - Continue IV vancomycin 1 g every 36 hours (8/25 -). - Continue IV Flagyl 500 mg every 12 hours (8/25 -). - Await vitamin B9/B12 labs  #Atrial fibrillation #QTc prolongation Qtc: 541. Avoid giving further Zofran and other QTc prolonging medications. - Continue home Eliquis 2.5 mg twice daily - Repeat EKG 8/27.  #AKI #Hyperkalemia BUN/CR: 56/2.17. K: 6.1.  Begun temporizing measures and lokelma. Likely secondary to worsening p.o. intake. - Given calcium gluconate 1 g in the ED. - Given insulin aspart 10 units x1 with 1 bolus of D50 simultaneously. - Begin IV LR with dextrose 5% 75 mL/hr over 10 hours. - Begin q2h CBG monitoring and setting of insulin administration. - Continue home midodrine 10 mg 3 times daily  to increase perfusion. - Trend potassium levels every 6 hours. - Await CMP morning labs  #Left greater than right pleural effusions #CHF (35-40%) Patient appeared largely normovolemic.  Somewhat reduced lung sounds in the bases. - Continue PO Lasix 20 mg daily as needed for hypervolemia.  #Elevated LFTs Unknown etiology at this time. - Ordered US abdomen RUQ.  #Goals of care - Patient was made DNR/DNI on previous hospital admission.  Diet: Normal VTE: Home Eliquis 2.5 twice daily IVF: LR,75cc/hr Code: DNR  Prior to Admission Living Arrangement: Home, living wife Anticipated Discharge Location: Home Barriers to Discharge: Identification and treatment of infectious source  Dispo: Admit patient to Inpatient with expected length of stay greater than 2 midnights.  Signed: Tomie China, MD Psychiatry Resident PGY-1  02/11/2023, 1:14 PM

## 2023-02-11 NOTE — ED Notes (Signed)
Family requesting air mattress for pt.

## 2023-02-11 NOTE — Progress Notes (Signed)
Pharmacy Antibiotic Note  [name] [name] is a 82 y.o. 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  Pharmacy has been consulted for vancomycin and cefepime dosing.  Vancomycin 1250 mg IV x 1 and cefepime 2g IV x 1 given in ED  Plan: Vancomycin 1g IV q 36h (eAUC 470) Cefepime 2g IV every 24 hours Monitor renal function, Cx and clinical progression to narrow Vancomycin levels as indicated  Weight: 74.3 kg (163 lb 12.8 oz)  Temp (24hrs), Avg:97.4 F (36.3 C), Min:97.4 F (36.3 C), Max:97.4 F (36.3 C)  Recent Labs  Lab 02/11/23 0757 02/11/23 0758 02/11/23 0917  WBC 8.8  --   --   CREATININE 2.17*  --   --   LATICACIDVEN  --  2.9* 2.9*    Estimated Creatinine Clearance: 26.6 mL/min (A) (by C-G formula based on SCr of 2.17 mg/dL (H)).    Allergies  Allergen Reactions   Lortab [Hydrocodone-Acetaminophen] Hives    No trouble breathing   Morphine And Codeine Hives   Other Other (See Comments)    Staples from surgery caused infection    Daylene Posey, PharmD, [facility] Clinical Pharmacist ED Pharmacist Phone # [phone] 02/11/2023 11:29 AM

## 2023-02-11 NOTE — Consult Note (Cosign Needed Addendum)
Orthopaedic Trauma Service (OTS) Consult   Patient ID: [name] [name] MRN: [id number] DOB/AGE: [birth year] 82 y.o.   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  He has been dealing with sacral decubitus ulcer and bilateral lower extremity ulcerations including all right second toe ulceration with exposed bone as well as serrations of his left toes being treated by podiatry in [place] as well as various other wounds to his lower extremities in various stages of healing due to poor blood flow to his lower extremities who was admitted this morning for altered mental status, concern for bacteremia.  Plain x-rays of his left ankle were obtained due to ankle pain which showed concern for joint effusion.  Orthopedics consulted for aspiration MRI was obtained prior to consultation as well which confirmed effusion.  No abscesses identified on MRI  Patient has been on numerous antibiotics over the past several months including prophylactic doxycycline  Patient seen and evaluated into C6.  Wife is at bedside patient appears very deconditioned.  Speaks a little bit but not too much.  He is sitting up in bed trying to eat  Wife has been performing dressing changes to his feet at home regularly cleaning with Betadine and covering wounds with Xeroform and gauze.  States that husband does ambulate with a walker   Please see internal medicine resident H&P for full summary of his clinical course over the last 6 to 8 weeks  Past Medical History:   Diagnosis Date   Acute on chronic diastolic heart failure (HCC) 08/22/2013   Acute on chronic respiratory failure with hypoxia (HCC) 10/03/2019   Acute on chronic systolic heart failure (HCC) 05/11/2013   EF from 35% to 50-55% on 6/14 ECHO.    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 No evidence of dissection or endoleak.  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 No evidence of abdominal aortic dissection.  Occluded proximal left subclav   Aortic valve insufficiency    Arthritis    "probably in my knees before they replaced them" (01/28/2015)   Atrial fibrillation (HCC) 05/21/2013   Atrial flutter (HCC)    Bradycardia 08/23/2013   CAD (coronary artery disease)    Chest pain 01/28/2015   CHF (congestive heart failure) (HCC)    Chronic diastolic heart failure (HCC) 01/28/2015   Descending aortic aneurysm (HCC)    DVT (deep venous thrombosis) (HCC)    ?LLE post knee surgery   Dyspnea 09/2019   HCAP (healthcare-associated pneumonia) 10/11/2013   History of blood transfusion    "after one of my knee surgeries"   HTN (hypertension)    Hyperlipidemia    Hypotension 12/17/2019   Incarcerated left inguinal hernia s/p repair 12/02/2019 12/01/2019   Insomnia 09/04/2013  Multiple fractures of ribs, right side, init for clos fx 09/04/2019   Near syncope 01/28/2015   Pressure injury of skin 10/06/2019   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 12/18/2019 12/17/2019   Recurrent UTI 10/06/2013   Shock circulatory (HCC) 10/07/2013   Thoracic aneurysm    Tobacco abuse    Urinary retention 10/06/2013    UTI (urinary tract infection) 10/06/2013    Past Surgical History:  Procedure Laterality Date   AORTIC VALVE REPLACEMENT  02/19/2009   AORTO-FEMORAL BYPASS GRAFT  03/2010   ascending aortic and arch aneurysm repair/notes 04/09/2009   CARDIAC VALVE REPLACEMENT     CAROTID-SUBCLAVIAN BYPASS GRAFT Left 08/26/2013   Procedure: BYPASS GRAFT CAROTID-SUBCLAVIAN;  Surgeon: Nada Libman, MD;  Location: [facility] OR;  Service: Vascular;  Laterality: Left;   CATARACT EXTRACTION W/ INTRAOCULAR LENS  IMPLANT, BILATERAL Bilateral    ENDOVASCULAR STENT INSERTION N/A 08/26/2013   Procedure:  THORACIC STENT GRAFT INSERTION;  Surgeon: Nada Libman, MD;  Location: [facility] OR;  Service: Vascular;  Laterality: N/A;   EYE SURGERY     INGUINAL HERNIA REPAIR Left 12/02/2019   Procedure: REPAIR LEFT INGUINAL HERNIA WITH MESH;  Surgeon: Violeta Gelinas, MD;  Location: [facility] OR;  Service: General;  Laterality: Left;   INGUINAL HERNIA REPAIR Left 12/18/2019   Procedure: LAPAROSCOPIC ASSISTED REPAIR OF RECURRENT INCARCERATED LEFT INGUINAL HERNIA WITH MESH; LAPAOSCOPIC REPAIR OF SEROSAL TEAR;  Surgeon: Gaynelle Adu, MD;  Location: [facility] OR;  Service: General;  Laterality: Left;   INSERTION OF MESH Left 12/02/2019   Procedure: INSERTION OF MESH;  Surgeon: Violeta Gelinas, MD;  Location: [facility] OR;  Service: General;  Laterality: Left;   JOINT REPLACEMENT     KNEE ARTHROSCOPY Bilateral    REPLACEMENT TOTAL KNEE Bilateral    right groin lymphocele Right 03/29/2009   RIGHT/LEFT HEART CATH AND CORONARY ANGIOGRAPHY N/A 10/06/2019   Procedure: RIGHT/LEFT HEART CATH AND CORONARY ANGIOGRAPHY;  Surgeon: Lyn Records, MD;  Location: [facility] INVASIVE CV LAB;  Service: Cardiovascular;  Laterality: N/A;   THORACENTESIS  2010 X 2   TONSILLECTOMY      Family History  Problem Relation Age of Onset   Celiac disease Daughter    Aortic aneurysm Father    Hypertension Mother    Heart attack Neg Hx    Stroke Neg Hx     Social History:  reports that he  quit smoking about 48 years ago. His smoking use included cigarettes. He has never used smokeless tobacco. He reports that he does not drink alcohol and does not use drugs.  Allergies:  Allergies  Allergen Reactions   Lortab [Hydrocodone-Acetaminophen] Hives    No trouble breathing   Morphine And Codeine Hives   Other Other (See Comments)    Staples from surgery caused infection    Medications: I have reviewed the patient's current medications. Current Outpatient Medications  Medication Instructions   acetaminophen (TYLENOL) 500 mg, Oral, Every 6 hours PRN   albuterol (PROVENTIL) (2.5 MG/3ML) 0.083% nebulizer solution Nebulization   amiodarone (PACERONE) 200 mg, Oral, Daily   amoxicillin (AMOXIL) 500 MG capsule SMARTSIG:4 Capsule(s) By Mouth Once   apixaban (ELIQUIS) 2.5 mg, Oral, 2 times daily   ascorbic acid (VITAMIN C) 500 mg, Oral, Daily   aspirin 81 mg, Oral, Daily   doxycycline (VIBRA-TABS) 100 mg, Oral, Every 12 hours   feeding supplement (ENSURE ENLIVE / ENSURE PLUS) LIQD 237 mLs, Oral, 3 times daily between meals   ferrous sulfate (  SV IRON) 325 mg, Oral, Daily with breakfast   furosemide (LASIX) 20 mg, Oral, Daily PRN   lansoprazole (PREVACID) 30 mg, Oral, Daily   levocetirizine (XYZAL) 5 mg, Oral, Daily   linezolid (ZYVOX) 600 mg, Oral, 2 times daily   megestrol (MEGACE) 40 mg, Oral, 2 times daily   melatonin 5 mg, Oral, At bedtime PRN   midodrine (PROAMATINE) 10 mg, Oral, 3 times daily with meals   Multiple Vitamin (MULTIVITAMIN WITH MINERALS) TABS tablet 1 tablet, Daily   ondansetron (ZOFRAN) 4 mg, Oral, Daily PRN   pantoprazole (PROTONIX) 40 mg, Oral, 2 times daily   potassium chloride SA (KLOR-CON M) 20 MEQ tablet 20-40 mEq, Oral, Daily   saccharomyces boulardii (FLORASTOR) 250 mg, Oral, 2 times daily   senna (SENOKOT) 8.6 mg, Oral, 2 times daily   tamsulosin (FLOMAX) 0.4 mg, Oral   traMADol (ULTRAM) 50 mg, Oral, Every 6 hours PRN   Vitamin D (Ergocalciferol)  (DRISDOL) 50,000 Units, Oral, Weekly     Results for orders placed or performed during the hospital encounter of 02/11/23 (from the past 48 hour(s))  Comprehensive metabolic panel     Status: Abnormal   Collection Time: 02/11/23  7:57 AM  Result Value Ref Range   Sodium 144 135 - 145 mmol/L   Potassium 6.6 (HH) 3.5 - 5.1 mmol/L    Comment: HEMOLYSIS AT THIS LEVEL MAY AFFECT RESULT CRITICAL RESULT CALLED TO, READ BACK BY AND VERIFIED WITH C SCHILING RN AT [phone] BY D LONG    Chloride 115 (H) 98 - 111 mmol/L   CO2 19 (L) 22 - 32 mmol/L   Glucose, Bld 112 (H) 70 - 99 mg/dL    Comment: Glucose reference range applies only to samples taken after fasting for at least 8 hours.   BUN 56 (H) 8 - 23 mg/dL   Creatinine, Ser 0.10 (H) 0.61 - 1.24 mg/dL   Calcium 8.9 8.9 - 27.2 mg/dL   Total Protein 6.4 (L) 6.5 - 8.1 g/dL   Albumin 2.5 (L) 3.5 - 5.0 g/dL   AST 536 (H) 15 - 41 U/L    Comment: HEMOLYSIS AT THIS LEVEL MAY AFFECT RESULT   ALT 157 (H) 0 - 44 U/L    Comment: HEMOLYSIS AT THIS LEVEL MAY AFFECT RESULT   Alkaline Phosphatase 187 (H) 38 - 126 U/L   Total Bilirubin 1.3 (H) 0.3 - 1.2 mg/dL    Comment: HEMOLYSIS AT THIS LEVEL MAY AFFECT RESULT   GFR, Estimated 29 (L) >60 mL/min    Comment: (NOTE) Calculated using the CKD-EPI Creatinine Equation (2021)    Anion gap 10 5 - 15    Comment: Performed at [facility], [address] [place], [place] [address]  CBC with Differential     Status: Abnormal   Collection Time: 02/11/23  7:57 AM  Result Value Ref Range   WBC 8.8 4.0 - 10.5 K/uL   RBC 2.71 (L) 4.22 - 5.81 MIL/uL   Hemoglobin 8.3 (L) 13.0 - 17.0 g/dL   HCT 47.4 (L) 25.9 - 56.3 %   MCV 107.4 (H) 80.0 - 100.0 fL   MCH 30.6 26.0 - 34.0 pg   MCHC 28.5 (L) 30.0 - 36.0 g/dL   RDW 87.5 (H) 64.3 - 32.9 %   Platelets 284 150 - 400 K/uL   nRBC 0.2 0.0 - 0.2 %   Neutrophils Relative % 90 %   Neutro Abs 7.9 (H) 1.7 - 7.7 K/uL   Lymphocytes  Relative 4 %   Lymphs Abs 0.4 (L)  0.7 - 4.0 K/uL   Monocytes Relative 5 %   Monocytes Absolute 0.4 0.1 - 1.0 K/uL   Eosinophils Relative 0 %   Eosinophils Absolute 0.0 0.0 - 0.5 K/uL   Basophils Relative 0 %   Basophils Absolute 0.0 0.0 - 0.1 K/uL   WBC Morphology MORPHOLOGY UNREMARKABLE    RBC Morphology See Note    Smear Review Normal platelet morphology    Immature Granulocytes 1 %   Abs Immature Granulocytes 0.08 (H) 0.00 - 0.07 K/uL   Polychromasia PRESENT     Comment: Performed at [facility], [address] [place], [place] [address]  Protime-INR     Status: Abnormal   Collection Time: 02/11/23  7:57 AM  Result Value Ref Range   Prothrombin Time 27.6 (H) 11.4 - 15.2 seconds   INR 2.5 (H) 0.8 - 1.2    Comment: (NOTE) INR goal varies based on device and disease states. Performed at [facility], [address], [place], [place] [address]   APTT     Status: Abnormal   Collection Time: 02/11/23  7:57 AM  Result Value Ref Range   aPTT 39 (H) 24 - 36 seconds    Comment:        IF BASELINE aPTT IS ELEVATED, SUGGEST PATIENT RISK ASSESSMENT BE USED TO DETERMINE APPROPRIATE ANTICOAGULANT THERAPY. Performed at [facility], [address], [place], [place] [address]   I-Stat Lactic Acid, ED     Status: Abnormal   Collection Time: 02/11/23  7:58 AM  Result Value Ref Range   Lactic Acid, Venous 2.9 (HH) 0.5 - 1.9 mmol/L   Comment NOTIFIED PHYSICIAN   Blood Culture (routine x 2)     Status: None (Preliminary result)   Collection Time: 02/11/23  8:00 AM   Specimen: BLOOD  Result Value Ref Range   Specimen Description BLOOD RIGHT ANTECUBITAL    Special Requests      BOTTLES DRAWN AEROBIC AND ANAEROBIC Blood Culture adequate volume   Culture      NO GROWTH <12 HOURS Performed at [facility], [address], [place], [place] [address]    Report Status PENDING   Blood Culture (routine x 2)     Status: None (Preliminary result)   Collection Time: 02/11/23  8:00 AM   Specimen: BLOOD  LEFT HAND  Result Value Ref Range   Specimen Description BLOOD LEFT HAND    Special Requests      BOTTLES DRAWN AEROBIC AND ANAEROBIC Blood Culture adequate volume   Culture      NO GROWTH <12 HOURS Performed at [facility], [address] [place], [place] [address]    Report Status PENDING   SARS Coronavirus 2 by RT PCR (hospital order, performed in [facility] hospital lab) *cepheid single result test* Anterior Nasal Swab     Status: None   Collection Time: 02/11/23  8:24 AM   Specimen: Anterior Nasal Swab  Result Value Ref Range   SARS Coronavirus 2 by RT PCR NEGATIVE NEGATIVE    Comment: Performed at [facility], [address], [place], [place] [address]  Potassium     Status: Abnormal   Collection Time: 02/11/23  9:10 AM  Result Value Ref Range   Potassium 6.1 (H) 3.5 - 5.1 mmol/L    Comment: Performed at [facility], [address], [place], [place]  [address]  I-Stat Lactic Acid, ED     Status: Abnormal   Collection Time: 02/11/23  9:17 AM  Result Value Ref Range   Lactic Acid, Venous 2.9 (HH) 0.5 - 1.9 mmol/L   Comment NOTIFIED PHYSICIAN   MRSA Next Gen by PCR, Nasal     Status: None   Collection Time: 02/11/23  1:37 PM   Specimen: Anterior Nasal Swab  Result Value Ref Range   MRSA by PCR Next Gen NOT DETECTED NOT DETECTED    Comment: (NOTE) The GeneXpert MRSA Assay (FDA approved for NASAL specimens only), is one component of a comprehensive MRSA colonization surveillance program. It is not intended to diagnose MRSA infection nor to guide or monitor treatment for MRSA infections. Test performance is not FDA approved in patients less than 31 years old. Performed at [facility], [address], [place], [place] [address]   TSH     Status: Abnormal   Collection Time: 02/11/23  1:55 PM  Result Value Ref Range   TSH 6.904 (H) 0.350 - 4.500 uIU/mL    Comment: Performed by a 3rd Generation assay with a functional sensitivity of <=0.01  uIU/mL. Performed at [facility], [address], [place], [place] [address]   Potassium     Status: Abnormal   Collection Time: 02/11/23  1:55 PM  Result Value Ref Range   Potassium 6.1 (H) 3.5 - 5.1 mmol/L    Comment: Performed at [facility], [address], [place], [place] [address]  Glucose, capillary     Status: None   Collection Time: 02/11/23  2:53 PM  Result Value Ref Range   Glucose-Capillary 89 70 - 99 mg/dL    Comment: Glucose reference range applies only to samples taken after fasting for at least 8 hours.  Urinalysis, w/ Reflex to Culture (Infection Suspected) -Urine, Clean Catch     Status: Abnormal   Collection Time: 02/11/23  3:50 PM  Result Value Ref Range   Specimen Source URINE, CLEAN CATCH    Color, Urine AMBER (A) YELLOW    Comment: BIOCHEMICALS MAY BE AFFECTED BY COLOR   APPearance HAZY (A) CLEAR   Specific Gravity, Urine 1.017 1.005 - 1.030   pH 5.0 5.0 - 8.0   Glucose, UA NEGATIVE NEGATIVE mg/dL   Hgb urine dipstick NEGATIVE NEGATIVE   Bilirubin Urine NEGATIVE NEGATIVE   Ketones, ur NEGATIVE NEGATIVE mg/dL   Protein, ur 295 (A) NEGATIVE mg/dL   Nitrite NEGATIVE NEGATIVE   Leukocytes,Ua NEGATIVE NEGATIVE   RBC / HPF 0-5 0 - 5 RBC/hpf   WBC, UA 0-5 0 - 5 WBC/hpf    Comment:        Reflex urine culture not performed if WBC <=10, OR if Squamous epithelial cells >5. If Squamous epithelial cells >5 suggest recollection.    Bacteria, UA NONE SEEN NONE SEEN   Squamous Epithelial / HPF 0-5 0 - 5 /HPF   Mucus PRESENT    Hyaline Casts, UA PRESENT     Comment: Performed at [facility] Lab, [address], [place], [place] [address]  Glucose, capillary     Status: Abnormal   Collection Time: 02/11/23  3:53 PM  Result Value Ref Range   Glucose-Capillary 152 (H) 70 - 99 mg/dL    Comment: Glucose reference range applies only to samples taken after fasting for at least 8 hours.    US Abdomen Limited RUQ (LIVER/GB)  Result Date:  02/11/2023 CLINICAL DATA:  Elevated liver enzymes EXAM: ULTRASOUND ABDOMEN LIMITED RIGHT UPPER QUADRANT COMPARISON:  CT 02/11/2023 noncontrast FINDINGS: Gallbladder: Gallbladder is mildly distended. There is sludge and stones. Gallbladder wall is thickened at 3.5 mm. Mild adjacent ascites. No reported sonographic Murphy's sign Common bile duct: Diameter: 2 mm Liver: No focal lesion identified. Within normal limits in parenchymal echogenicity. Portal vein is patent on color Doppler imaging with normal direction of blood flow towards the liver. Other: None. IMPRESSION: Gallbladder sludge and stones. Mild wall thickening but there is some mild adjacent ascites. No ductal dilatation. If there is further concern of acute cholecystitis, HIDA scan could be considered as clinically appropriate Electronically Signed   By: Karen Kays M.D.   On: 02/11/2023 17:25   MR ANKLE LEFT WO CONTRAST  Addendum Date: 02/11/2023   ADDENDUM REPORT: 02/11/2023 17:04 ADDENDUM: The original report was by Dr. Gaylyn Rong. The following addendum is by Dr. Gaylyn Rong: This addendum is to correct a typo in the voice recognition transcribed report. The final sentence under the "other" section in the body of the report should read: NO abscess identified. Electronically Signed   By: Gaylyn Rong M.D.   On: 02/11/2023 17:04   Result Date: 02/11/2023 CLINICAL DATA:  Ankle pain, query septic arthritis/osteomyelitis EXAM: MRI OF THE LEFT ANKLE WITHOUT CONTRAST TECHNIQUE: Multiplanar, multisequence MR imaging of the ankle was performed. No intravenous contrast was administered. COMPARISON:  Radiographs 02/11/2023 FINDINGS: Despite efforts by the technologist and patient, motion artifact is present on today's exam and could not be eliminated. This reduces exam sensitivity and specificity. TENDONS Peroneal: Peroneus brevis tendinopathy posterior to the lateral malleolus. Posteromedial: Torn flexor digitorum longus tendon, only  well appreciated distal to the knot of Henry. Anterior: Expanded tibialis anterior tendon compatible tendinopathy. Trace metal artifact along the distal tendon, query prior surgical reattachment. Achilles: Fusiform Achilles contour with accentuated distal signal compatible with moderate distal Achilles tendinopathy. Plantar Fascia: Mild thickening of the medial band of the plantar fascia, cannot exclude mild plantar fasciitis. Plantar calcaneal spur noted. LIGAMENTS Lateral: Attenuated appearance of the anterior talofibular ligament suggesting a remote injury. Medial: Grossly unremarkable CARTILAGE Ankle Joint: Moderate tibiotalar joint effusion. Mild endosteal edema along the tibiotalar joint Subtalar Joints/Sinus Tarsi: Unremarkable Bones: Degenerative findings at the Lisfranc joint. Other: Subcutaneous edema along the anterior ankle and along the plantar and dorsal foot. Abscess identified. IMPRESSION: 1. Moderate tibiotalar joint effusion with mild endosteal edema along the tibiotalar joint. This is nonspecific and could be degenerative or inflammatory, but septic arthritis is not difficult to totally exclude. There less synovitis than I would typically expect in septic arthritis. Correlate clinically in determining whether arthrocentesis is indicated. 2. Torn flexor digitorum longus tendon, only well appreciated distal to the knot of Henry. 3. Expanded tibialis anterior tendon compatible with tendinopathy. Trace metal artifact along the distal tendon, query prior surgical reattachment. 4. Moderate distal Achilles tendinopathy. 5. Mild thickening of the medial band of the plantar fascia, cannot exclude mild plantar fasciitis. 6. Peroneus brevis tendinopathy posterior to the lateral malleolus. 7. Attenuated appearance of the anterior talofibular ligament suggesting a remote injury. 8. Subcutaneous edema along the anterior ankle and along the plantar and dorsal foot. No abscess identified. Electronically Signed:  By: Gaylyn Rong M.D. On: 02/11/2023 15:34   CT CHEST ABDOMEN PELVIS WO CONTRAST  Result Date: 02/11/2023 CLINICAL DATA:  Sepsis. EXAM: CT CHEST, ABDOMEN AND PELVIS WITHOUT CONTRAST TECHNIQUE: Multidetector CT imaging of the chest, abdomen and pelvis was performed following the standard  protocol without IV contrast. RADIATION DOSE REDUCTION: This exam was performed according to the departmental dose-optimization program which includes automated exposure control, adjustment of the mA and/or kV according to patient size and/or use of iterative reconstruction technique. COMPARISON:  Chest CT on 11/28/2022, and AP CT on 09/20/2022 FINDINGS: CT CHEST FINDINGS Cardiovascular: Stable appearance of aortic valve replacement, tube graft repair of ascending aorta, and endovascular stent graft of the aortic arch and descending thoracic aorta. Stable moderate cardiomegaly. Mediastinum/Lymph Nodes: No masses or pathologically enlarged lymph nodes identified on this unenhanced exam. Lungs/Pleura: Stable small to moderate bilateral pleural effusions and bilateral lower lobe atelectasis. Resolution of previously seen airspace disease in right upper and middle lobes since prior study. No new or increased areas of pulmonary opacity are seen. No No evidence of central endobronchial obstruction. Stable 4 mm pulmonary nodule in anterior right upper lobe on image 58/5. Musculoskeletal: No suspicious bone lesions identified. Multiple old right posterior rib fracture deformities are noted. 1 CT ABDOMEN AND PELVIS FINDINGS Hepatobiliary: No masses visualized on this unenhanced exam. Cholelithiasis. No radiographic evidence of cholecystitis. Pancreas: No mass or inflammatory changes identified on this unenhanced exam. Spleen:  Within normal limits in size. Adrenals/Urinary Tract: No evidence of renal or ureteral calculi, or hydronephrosis. Minimal radiodensity is seen along the left posterior bladder wall, suspicious for tiny  bladder calculi. Stomach/Bowel: No evidence of obstruction, inflammatory process, or abnormal fluid collections. A large left inguinal hernia is seen which contains sigmoid colon. This is unchanged, and there is no evidence of bowel obstruction or strangulation. Diverticulosis is seen mainly involving the descending and sigmoid colon, however there is no evidence of diverticulitis. Normal appendix visualized. Vascular/Lymphatic: No pathologically enlarged lymph nodes identified. No abdominal aortic aneurysm. Reproductive:  Mildly enlarged prostate. Other:  None. Musculoskeletal: No suspicious bone lesions identified. Mild superior endplate compression fracture of the L1 vertebral body is seen which is new since prior exam. IMPRESSION: Stable small to moderate bilateral pleural effusions and bilateral lower lobe atelectasis. Resolution of previously seen airspace disease in right upper and middle lobes. No new or increased areas of pulmonary opacity. Stable 4 mm right upper lobe pulmonary nodule. No follow-up needed if patient is low-risk. Non-contrast chest CT can be considered in 12 months if patient is high-risk. This recommendation follows the consensus statement: Guidelines for Management of Incidental Pulmonary Nodules Detected on CT Images: From the Fleischner Society 2017; Radiology 2017; 284:228-243. Cholelithiasis. No radiographic evidence of cholecystitis. Large left inguinal hernia containing sigmoid colon. No evidence of bowel obstruction or strangulation. Colonic diverticulosis, without radiographic evidence of diverticulitis. Mildly enlarged prostate. Probable tiny bladder calculi. No evidence of ureteral calculi or hydronephrosis. Mild L1 vertebral body compression fracture, new since prior study. Electronically Signed   By: Danae Orleans M.D.   On: 02/11/2023 15:15   DG Ankle Complete Left  Result Date: 02/11/2023 CLINICAL DATA:  Wound infection. EXAM: LEFT ANKLE COMPLETE - 3+ VIEW COMPARISON:   Left foot radiographs 11/20/2022 at [facility] FINDINGS: Soft tissue swelling is slightly more prominent medial than lateral. No acute or focal osseous abnormalities are present. A moderate-sized joint effusion is present. Mild osteopenia is noted. Plantar calcaneal spur is again seen. IMPRESSION: 1. Soft tissue swelling and moderate-sized joint effusion without acute or focal osseous abnormality. 2. Plantar calcaneal spur. 3. Mild osteopenia. Electronically Signed   By: Marin Roberts M.D.   On: 02/11/2023 08:49   DG Chest Port 1 View  Result Date: 02/11/2023 CLINICAL DATA:  Left  ankle wound infection.  Question sepsis. EXAM: PORTABLE CHEST 1 VIEW COMPARISON:  Two-view chest x-ray 12/22/2022 FINDINGS: Heart is enlarged. Aortic stent grafting noted. Left greater than right pleural effusions are again seen. Mild pulmonary vascular congestion is noted. Bibasilar airspace opacities are present, left greater than right. Atherosclerotic calcifications are noted within the distal subclavian and axillary arteries. Degenerative changes are present in the shoulders, right greater than left. IMPRESSION: 1. Cardiomegaly and mild pulmonary vascular congestion without frank edema. 2. Left greater than right pleural effusions and bibasilar airspace disease, left greater than right. While this may represent atelectasis, infection is not excluded. Electronically Signed   By: Marin Roberts M.D.   On: 02/11/2023 08:34    Intake/Output      08/24 0701 08/25 0700 08/25 0701 08/26 0700   I.V. (mL/kg)  7.9 (0.1)   IV Piggyback  1406.2   Total Intake(mL/kg)  1414.1 (19.4)   Net  +1414.1           Review of Systems  Unable to perform ROS: Mental status change   Blood pressure (!) 96/51, pulse 76, temperature 97.8 F (36.6 C), temperature source Oral, resp. rate 18, height 5\' 10"  (1.778 m), weight 72.9 kg, SpO2 91%. Physical Exam Vitals and nursing note reviewed.  Constitutional:      Comments:  Frail-appearing white male  Musculoskeletal:     Comments: Left lower extremity Soft Kerlix dressing to the lower leg ankle and foot All dressings removed including Xeroform. Foot and ankle have wounds of various stages of healing and all appear to be relatively stable Patient has pain with gentle manipulation of his left ankle No significant erythema or increased temperature to the left ankle noted Faint DP pulse is noted on palpation No substantial swelling to the lower leg or ankle noted Generalized sarcopenia Patient not really following commands with respect to motor or sensory evaluation  Right lower extremity Dressing was not removed completely however I did visualize his second toe which clearly has exposed bone from his phalanx dorsally.  No active drainage or odor appreciated      Assessment/Plan:  82 year old male with complex medical history admitted for altered mental status with concerns for bacteremia, left ankle pain and effusion, bilateral lower extremity ulcerations  -Left ankle effusion----> septic arthritis L ankle  Verbal consent obtained from wife to proceed with bedside ankle arthrocentesis   Using aseptic technique joint line identified medially between the medial malleolus and tibialis anterior.  This area was marked with a pen and clean with alcohol swab and Betadine x 4.  The skin was anesthetized with ethyl chloride spray and the area was infiltrated with a 21-gauge needle on a 10 cc syringe.  Joint was entered and immediately upon entrance purulent fluid was withdrawn.  Total of about 3 to 4 cc of purulent fluid was obtained.  Needle was withdrawn and pressure applied to the aspiration site.  A 2 x 2 Mepilex was applied to the area and his Prevalon boots were placed on.  I did ask the nurse to reapply Xeroform and Kerlix when able to do so   Specimen will be sent for cell count with differential as well as aerobic anaerobic cultures and Gram stain   I have  communicated with the infectious disease service as well as the medical service   Even though patient is in a relatively frail state I think that formal joint irrigation debridement is warranted particularly in the setting of his poor arterial flow.  If anesthesia feels there is too much risk we could consider intra-articular antibiotics   Will have anesthesia do a consult preoperatively to evaluate and discuss anesthetic options with patient and family.      - Dispo:  Continue with inpatient care  Possible OR tomorrow for formal I&D of left ankle joint  Npo after mn     Mearl Latin, PA-C [phone] (C) 02/11/2023, 5:42 PM  [facility] [address] [place] [address] [phone] [phone] (F)    After 5pm and on the weekends please log on to Amion, go to orthopaedics and the look under the [facility] Call for the provider(s) on call. You can also call our office at [phone] and then follow the prompts to be connected to the call team.

## 2023-02-11 NOTE — ED Notes (Signed)
ED TO INPATIENT HANDOFF REPORT  ED Nurse Name and Phone #: Leeroy Bock [phone]  S Name/Age/Gender [name] [name] 82 y.o. male Room/Bed: 027C/027C  Code Status   Code Status: DNR  Home/SNF/Other Home Patient oriented to: self, place, time, and situation Is this baseline? Yes   Triage Complete: Triage complete  Chief Complaint Altered mental state [R41.82]  Triage Note Pt arrives EMS from home with reports of ongoing emesis, nausea and decreased appetite for the last couple weeks. Pt is being treated with abx for wound infection on left foot. Pt reports feeling fatigued as well.    Allergies Allergies  Allergen Reactions   Lortab [Hydrocodone-Acetaminophen] Hives    No trouble breathing   Morphine And Codeine Hives   Other Other (See Comments)    Staples from surgery caused infection    Level of Care/Admitting Diagnosis ED Disposition     ED Disposition  Admit   Condition  --   Comment  Hospital Area: [facility] [[id number]]  Level of Care: Progressive [102]  Admit to Progressive based on following criteria: MULTISYSTEM THREATS such as stable sepsis, metabolic/electrolyte imbalance with or without encephalopathy that is responding to early treatment.  May admit patient to [facility] or [facility] if equivalent level of care is available:: No  Covid Evaluation: Asymptomatic - no recent exposure (last 10 days) testing not required  Diagnosis: Altered mental state [782956]  Admitting Physician: Inez Catalina [[id number]]  Attending Physician: Inez Catalina [phone]  Certification:: I certify this patient will need inpatient services for at least 2 midnights  Expected Medical Readiness: 02/14/2023          B Medical/Surgery History Past Medical History:  Diagnosis Date   Acute on chronic diastolic heart failure (HCC) 08/22/2013   Acute on chronic respiratory failure with hypoxia (HCC) 10/03/2019   Acute on chronic systolic heart failure (HCC) 05/11/2013    EF from 35% to 50-55% on 6/14 ECHO.    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 No evidence of dissection or endoleak.  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 No evidence of abdominal aortic dissection.  Occluded proximal left subclav   Aortic valve insufficiency    Arthritis    "probably in my knees before they replaced them" (01/28/2015)   Atrial fibrillation (HCC) 05/21/2013   Atrial flutter (HCC)    Bradycardia 08/23/2013   CAD (coronary artery disease)    Chest pain 01/28/2015   CHF (congestive heart failure) (HCC)    Chronic diastolic heart failure (HCC) 01/28/2015   Descending aortic aneurysm (HCC)    DVT (deep venous thrombosis) (HCC)    ?LLE post knee surgery   Dyspnea 09/2019   HCAP (healthcare-associated pneumonia) 10/11/2013   History of blood transfusion    "after one of my knee surgeries"   HTN (hypertension)    Hyperlipidemia    Hypotension 12/17/2019   Incarcerated left inguinal hernia s/p repair 12/02/2019 12/01/2019   Insomnia 09/04/2013   Multiple fractures of ribs, right side, init for clos fx 09/04/2019   Near syncope 01/28/2015   Pressure injury of skin 10/06/2019   Recurrent left scrotal inguinal  hernia with incarceration s/p lap re-repair 12/18/2019 12/17/2019   Recurrent UTI 10/06/2013   Shock circulatory (HCC) 10/07/2013   Thoracic aneurysm    Tobacco abuse    Urinary retention 10/06/2013   UTI (urinary tract infection) 10/06/2013   Past Surgical History:  Procedure Laterality Date   AORTIC VALVE REPLACEMENT  02/19/2009   AORTO-FEMORAL BYPASS GRAFT  03/2010   ascending aortic and arch  aneurysm repair/notes 04/09/2009   CARDIAC VALVE REPLACEMENT     CAROTID-SUBCLAVIAN BYPASS GRAFT Left 08/26/2013   Procedure: BYPASS GRAFT CAROTID-SUBCLAVIAN;  Surgeon: Nada Libman, MD;  Location: [facility] OR;  Service: Vascular;  Laterality: Left;   CATARACT EXTRACTION W/ INTRAOCULAR LENS  IMPLANT, BILATERAL Bilateral    ENDOVASCULAR STENT INSERTION N/A 08/26/2013   Procedure:  THORACIC STENT GRAFT INSERTION;  Surgeon: Nada Libman, MD;  Location: [facility] OR;  Service: Vascular;  Laterality: N/A;   EYE SURGERY     INGUINAL HERNIA REPAIR Left 12/02/2019   Procedure: REPAIR LEFT INGUINAL HERNIA WITH MESH;  Surgeon: Violeta Gelinas, MD;  Location: [facility] OR;  Service: General;  Laterality: Left;   INGUINAL HERNIA REPAIR Left 12/18/2019   Procedure: LAPAROSCOPIC ASSISTED REPAIR OF RECURRENT INCARCERATED LEFT INGUINAL HERNIA WITH MESH; LAPAOSCOPIC REPAIR OF SEROSAL TEAR;  Surgeon: Gaynelle Adu, MD;  Location[facility] OR;  Service: General;  Laterality: Left;   INSERTION OF MESH Left 12/02/2019   Procedure: INSERTION OF MESH;  Surgeon: Violeta Gelinas, MD;  Location: [facility] OR;  Service: General;  Laterality: Left;   JOINT REPLACEMENT     KNEE ARTHROSCOPY Bilateral    REPLACEMENT TOTAL KNEE Bilateral    right groin lymphocele Right 03/29/2009   RIGHT/LEFT HEART CATH AND CORONARY ANGIOGRAPHY N/A 10/06/2019   Procedure: RIGHT/LEFT HEART CATH AND CORONARY ANGIOGRAPHY;  Surgeon: Lyn Records, MD;  Location: [facility] INVASIVE CV LAB;  Service: Cardiovascular;  Laterality: N/A;   THORACENTESIS  2010 X 2   TONSILLECTOMY       A IV Location/Drains/Wounds Patient Lines/Drains/Airways Status     Active Line/Drains/Airways     Name Placement date Placement time Site Days   Peripheral IV 02/11/23 22 G Anterior;Left Hand 02/11/23  0829  Hand  less than 1   Peripheral IV 02/11/23 20 G Left Antecubital 02/11/23  0947  Antecubital  less than 1   Pressure Injury 11/29/22 Heel Right Stage 3 -  Full thickness tissue loss. Subcutaneous  fat may be visible but bone, tendon or muscle are NOT exposed. 11/29/22  1046  -- 74   Pressure Injury 12/08/22 Sacrum Deep Tissue Pressure Injury - Purple or maroon localized area of discolored intact skin or blood-filled blister due to damage of underlying soft tissue from pressure and/or shear. large area purple area extending to the co 12/08/22  1910  -- 65   Wound / Incision (Open or Dehisced) 11/29/22 Venous stasis ulcer Pretibial Left;Lateral 11/29/22  1046  Pretibial  74   Wound / Incision (Open or Dehisced) 11/29/22 Non-pressure wound Toe (Comment  which one) Anterior;Right exposed bone 11/29/22  1047  Toe (Comment  which one)  74   Wound / Incision (Open or Dehisced) 11/29/22 Non-pressure wound Toe (Comment  which one) Left 11/29/22  1047  Toe (Comment  which one)  74   Wound / Incision (Open or Dehisced) 11/29/22 Venous stasis ulcer Pretibial Right 11/29/22  1047  Pretibial  74            Intake/Output Last 24 hours  Intake/Output Summary (Last 24  hours) at 02/11/2023 1134 Last data filed at 02/11/2023 1114 Gross per 24 hour  Intake 148.98 ml  Output --  Net 148.98 ml    Labs/Imaging Results for orders placed or performed during the hospital encounter of 02/11/23 (from the past 48 hour(s))  Comprehensive metabolic panel     Status: Abnormal   Collection Time: 02/11/23  7:57 AM  Result Value Ref Range   Sodium 144 135 - 145 mmol/L   Potassium 6.6 (HH) 3.5 - 5.1 mmol/L    Comment: HEMOLYSIS AT THIS LEVEL MAY AFFECT RESULT CRITICAL RESULT CALLED TO, READ BACK BY AND VERIFIED WITH C SCHILING RN AT [phone] BY D LONG    Chloride 115 (H) 98 - 111 mmol/L   CO2 19 (L) 22 - 32 mmol/L   Glucose, Bld 112 (H) 70 - 99 mg/dL    Comment: Glucose reference range applies only to samples taken after fasting for at least 8 hours.   BUN 56 (H) 8 - 23 mg/dL   Creatinine, Ser 7.84 (H) 0.61 - 1.24 mg/dL   Calcium 8.9 8.9 - 69.6 mg/dL   Total Protein 6.4 (L) 6.5 - 8.1 g/dL   Albumin 2.5  (L) 3.5 - 5.0 g/dL   AST 295 (H) 15 - 41 U/L    Comment: HEMOLYSIS AT THIS LEVEL MAY AFFECT RESULT   ALT 157 (H) 0 - 44 U/L    Comment: HEMOLYSIS AT THIS LEVEL MAY AFFECT RESULT   Alkaline Phosphatase 187 (H) 38 - 126 U/L   Total Bilirubin 1.3 (H) 0.3 - 1.2 mg/dL    Comment: HEMOLYSIS AT THIS LEVEL MAY AFFECT RESULT   GFR, Estimated 29 (L) >60 mL/min    Comment: (NOTE) Calculated using the CKD-EPI Creatinine Equation (2021)    Anion gap 10 5 - 15    Comment: Performed at [facility], [address], [place], [place] [address]  CBC with Differential     Status: Abnormal   Collection Time: 02/11/23  7:57 AM  Result Value Ref Range   WBC 8.8 4.0 - 10.5 K/uL   RBC 2.71 (L) 4.22 - 5.81 MIL/uL   Hemoglobin 8.3 (L) 13.0 - 17.0 g/dL   HCT 24.4 (L) 01.0 - 27.2 %   MCV 107.4 (H) 80.0 - 100.0 fL   MCH 30.6 26.0 - 34.0 pg   MCHC 28.5 (L) 30.0 - 36.0 g/dL   RDW 53.6 (H) 64.4 - 03.4 %   Platelets 284 150 - 400 K/uL   nRBC 0.2 0.0 - 0.2 %   Neutrophils Relative % 90 %   Neutro Abs 7.9 (H) 1.7 - 7.7 K/uL   Lymphocytes Relative 4 %   Lymphs Abs 0.4 (L) 0.7 - 4.0 K/uL   Monocytes Relative 5 %   Monocytes Absolute 0.4 0.1 - 1.0 K/uL   Eosinophils Relative 0 %   Eosinophils Absolute 0.0 0.0 - 0.5 K/uL   Basophils Relative 0 %   Basophils Absolute 0.0 0.0 - 0.1 K/uL   WBC Morphology MORPHOLOGY UNREMARKABLE    RBC Morphology See Note    Smear Review Normal platelet morphology    Immature Granulocytes 1 %   Abs Immature Granulocytes 0.08 (H) 0.00 - 0.07 K/uL   Polychromasia PRESENT     Comment: Performed at [facility], [address], [place], [place] [address]  Protime-INR     Status: Abnormal   Collection Time: 02/11/23  7:57 AM  Result Value Ref Range   Prothrombin  Time 27.6 (H) 11.4 - 15.2 seconds   INR 2.5 (H) 0.8 - 1.2    Comment: (NOTE) INR goal varies based on device and disease states. Performed at [facility], [address], [place], [place] [address]    APTT     Status: Abnormal   Collection Time: 02/11/23  7:57 AM  Result Value Ref Range   aPTT 39 (H) 24 - 36 seconds    Comment:        IF BASELINE aPTT IS ELEVATED, SUGGEST PATIENT RISK ASSESSMENT BE USED TO DETERMINE APPROPRIATE ANTICOAGULANT THERAPY. Performed at [facility], [address], [place], [place] [address]   I-Stat Lactic Acid, ED     Status: Abnormal   Collection Time: 02/11/23  7:58 AM  Result Value Ref Range   Lactic Acid, Venous 2.9 (HH) 0.5 - 1.9 mmol/L   Comment NOTIFIED PHYSICIAN   Blood Culture (routine x 2)     Status: None (Preliminary result)   Collection Time: 02/11/23  8:00 AM   Specimen: BLOOD  Result Value Ref Range   Specimen Description BLOOD RIGHT ANTECUBITAL    Special Requests      BOTTLES DRAWN AEROBIC AND ANAEROBIC Blood Culture adequate volume   Culture      NO GROWTH <12 HOURS Performed at [facility], [address], [place], [place] [address]    Report Status PENDING   Blood Culture (routine x 2)     Status: None (Preliminary result)   Collection Time: 02/11/23  8:00 AM   Specimen: BLOOD LEFT HAND  Result Value Ref Range   Specimen Description BLOOD LEFT HAND    Special Requests      BOTTLES DRAWN AEROBIC AND ANAEROBIC Blood Culture adequate volume   Culture      NO GROWTH <12 HOURS Performed at [facility], [address], [place], [place] [address]    Report Status PENDING   Potassium     Status: Abnormal   Collection Time: 02/11/23  9:10 AM  Result Value Ref Range   Potassium 6.1 (H) 3.5 - 5.1 mmol/L    Comment: Performed at [facility], [address] [place], [place] [address]  I-Stat Lactic Acid, ED     Status: Abnormal   Collection Time: 02/11/23  9:17 AM  Result Value Ref Range   Lactic Acid, Venous 2.9 (HH) 0.5 - 1.9 mmol/L   Comment NOTIFIED PHYSICIAN    DG Ankle Complete Left  Result Date: 02/11/2023 CLINICAL DATA:  Wound infection. EXAM: LEFT ANKLE COMPLETE - 3+ VIEW COMPARISON:   Left foot radiographs 11/20/2022 at [facility] FINDINGS: Soft tissue swelling is slightly more prominent medial than lateral. No acute or focal osseous abnormalities are present. A moderate-sized joint effusion is present. Mild osteopenia is noted. Plantar calcaneal spur is again seen. IMPRESSION: 1. Soft tissue swelling and moderate-sized joint effusion without acute or focal osseous abnormality. 2. Plantar calcaneal spur. 3. Mild osteopenia. Electronically Signed   By: Marin Roberts M.D.   On: 02/11/2023 08:49   DG Chest Port 1 View  Result Date: 02/11/2023 CLINICAL DATA:  Left ankle wound infection.  Question sepsis. EXAM: PORTABLE CHEST 1 VIEW COMPARISON:  Two-view chest x-ray 12/22/2022 FINDINGS: Heart is enlarged. Aortic stent grafting noted. Left greater than right pleural effusions are again seen. Mild pulmonary vascular congestion is noted. Bibasilar airspace opacities are present, left greater than right. Atherosclerotic calcifications are noted within the distal subclavian and  axillary arteries. Degenerative changes are present in the shoulders, right greater than left. IMPRESSION: 1. Cardiomegaly and mild pulmonary vascular congestion without frank edema. 2. Left greater than right pleural effusions and bibasilar airspace disease, left greater than right. While this may represent atelectasis, infection is not excluded. Electronically Signed   By: Marin Roberts M.D.   On: 02/11/2023 08:34    Pending Labs Unresulted Labs (From admission, onward)     Start     Ordered   02/12/23 0500  Comprehensive metabolic panel  Tomorrow morning,   R        02/11/23 1122   02/12/23 0500  CBC  Tomorrow morning,   R        02/11/23 1122   02/11/23 1400  Potassium  Now then every 6 hours,   R (with TIMED occurrences)      02/11/23 1127   02/11/23 1119  TSH  Once,   R        02/11/23 1122   02/11/23 0824  SARS Coronavirus 2 by RT PCR (hospital order, performed in [facility] hospital lab)  *cepheid single result test* Anterior Nasal Swab  (SARS Coronavirus 2 by RT PCR (hospital order, performed in [facility] hospital lab) *cepheid single result test*)  Once,   URGENT        02/11/23 0823   02/11/23 0724  Urinalysis, w/ Reflex to Culture (Infection Suspected) -Urine, Clean Catch  (Undifferentiated presentation (screening labs and basic nursing orders))  ONCE - URGENT,   URGENT       Question:  Specimen Source  Answer:  Urine, Clean Catch   02/11/23 0724            Vitals/Pain Today's Vitals   02/11/23 0707 02/11/23 0709 02/11/23 1000  BP:  (!) 106/57 (!) 107/49  Pulse:  95 85  Resp:  18 (!) 23  Temp:  (!) 97.4 F (36.3 C)   SpO2:  95% 100%  Weight: 74.3 kg    PainSc: 0-No pain      Isolation Precautions No active isolations  Medications Medications  vancomycin (VANCOREADY) IVPB 1250 mg/250 mL (1,250 mg Intravenous New Bag/Given 02/11/23 1114)  metroNIDAZOLE (FLAGYL) IVPB 500 mg (has no administration in time range)  metroNIDAZOLE (FLAGYL) IVPB 500 mg (has no administration in time range)  acetaminophen (TYLENOL) tablet 500 mg (has no administration in time range)  amiodarone (PACERONE) tablet 200 mg (has no administration in time range)  apixaban (ELIQUIS) tablet 2.5 mg (has no administration in time range)  aspirin chewable tablet 81 mg (has no administration in time range)  furosemide (LASIX) tablet 20 mg (has no administration in time range)  pantoprazole (PROTONIX) EC tablet 40 mg (has no administration in time range)  megestrol (MEGACE) tablet 40 mg (has no administration in time range)  midodrine (PROAMATINE) tablet 10 mg (has no administration in time range)  ondansetron (ZOFRAN) tablet 4 mg (has no administration in time range)  traMADol (ULTRAM) tablet 50 mg (has no administration in time range)  senna (SENOKOT) tablet 8.6 mg (has no administration in time range)  insulin aspart (novoLOG) injection 10 Units (has no administration in time range)   dextrose 5 % in lactated ringers infusion (has no administration in time range)  lactated ringers bolus 1,000 mL (1,000 mLs Intravenous New Bag/Given 02/11/23 0849)  ondansetron (ZOFRAN) injection 4 mg (4 mg Intravenous Given 02/11/23 0846)  calcium gluconate 1 g/ 50 mL sodium chloride IVPB (0 mg Intravenous Stopped 02/11/23 1114)  ceFEPIme (MAXIPIME) 2 g in sodium chloride 0.9 % 100 mL IVPB (0 g Intravenous Stopped 02/11/23 1019)    Mobility non-ambulatory     Focused Assessments Cardiac Assessment Handoff:    Lab Results  Component Value Date   CKTOTAL 384 11/29/2022   CKMB 3.5 07/29/2010   TROPONINI <0.03 01/29/2015   Lab Results  Component Value Date   DDIMER 18.73 (H) 10/06/2013   Does the Patient currently have chest pain? No    R Recommendations: See Admitting Provider Note  Report given to:   Additional Notes: Pt has multiple wounds on feet and wife at bedside said pressure wounds have been an issue for him with his previous admissions. Requested an air mattress

## 2023-02-11 NOTE — Progress Notes (Signed)
ED Pharmacy Antibiotic Sign Off An antibiotic consult was received from an ED provider for Vancomycin and Cefepime per pharmacy dosing for Sepsis. A chart review was completed to assess appropriateness.   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   Thank you for allowing pharmacy to be a part of this patient's care.   Eldridge Scot, PharmD, BCCCP Clinical Pharmacist 02/11/2023, 9:15 AM

## 2023-02-11 NOTE — H&P (Cosign Needed Addendum)
Date: 02/11/2023               Patient Name:  [name] [name] MRN: [id number]  DOB: 11-08-1940 Age / Sex: 82 y.o., male   PCP: Street, [name], MD         Medical Service: Internal Medicine Teaching Service         Attending Physician: Dr. Inez Catalina, MD      First Contact: Tomie China, MD [phone]    Second Contact: Dr. 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 Afterwards, he was admitted to inpatient PM&R rehab (6/21 - 7/6) and discharged on 4-week linezolid/indefinite doxycycline prophylaxis forleft heel and toe ulcers . He presents with a ~1-week history of worsening p.o. intake and a 1-day history of nausea/vomiting.   On interview, wife [name] [name]) and daughter [name] [name]) are present.  Majority of history obtained from family.  Patient is lying in bed, minimally attentive to conversation.  Patient was discharged from CIR approximately 5 weeks ago, over which time he showed improvement in functional status.  However, about 1 week ago patient said he did not want to eat anymore and began refusing food.  Family also had difficulty getting him to drink his Ensure supplementation.  Denies fevers, chills.  Endorses productive cough with clear sputum.  Family also says that he he  has been tired over the past few days and that his condition worsened even further 24 hours ago, when he began experiencing nausea and vomiting. No increased work of breathing. Family denies hematemesis, coffee-ground emesis.  Patient was given a dose of Zofran at home for this yesterday 8/24, which reportedly caused constipation.  Last bowel movement 8/23.  No changes in stool frequency/color.  Patient is seen by home health once a week and PT/OT twice a week.  Regarding patient's toe wound, family says that he does not consent to toe amputation and that it would eventually detach naturally.  Has had extensive conversations with orthopedic surgery at previous visit.  Patient also has a sacral decubitus ulcer, which family describes as almost healed.  Wife manages medication, says he has been adherent.  Regarding CODE STATUS: Patient was made DNR/DNI at previous stay, family has previously spoken with palliative care.  However, patient would like all noninvasive treatments to continue, including antibiotics.  Medications Doxycyclin 100 mg twice daily Aspirin 81 mg Iron 325 mg daily Amiodarone 200 mg daily Midodrine 10 mg 3 times daily Tamsolosin 0.4 mg daily Eliquis 2.5 BID Lasix 20 mg daily  ED course:  BP: 106/58.  HR: 95.  RR: 18.  Afebrile.  Satting 95% on room air.  K: 6.1.  BUN: 56.  Creatinine: 2.17 (baseline: ~1.8).  WBC: 8.8.  Hemoglobin: 8.3.  Lactate: 2.9.  Blood cultures drawn.  ED imaging:  XR chest:  IMPRESSION:  1. Cardiomegaly and mild pulmonary vascular congestion without frank edema. 2. Left greater than right pleural effusions and bibasilar airspace disease, left greater than right. While this may represent atelectasis, infection is not excluded.  XR ankle:  IMPRESSION: 1. Soft tissue swelling and moderate-sized joint effusion without acute or focal osseous abnormality. 2. Plantar calcaneal spur. 3. Mild osteopenia.  ED meds:  IV vancomycin 1250 mg x1 IV  cefepime 2 g x1 IV Flagyl 500 mg x1  IV calcium gluconate IV Zofran 4 mg IV LR 1 L bolus  Meds:      Past Medical History  Past Surgical History:  Procedure Laterality Date   AORTIC VALVE REPLACEMENT  02/19/2009   AORTO-FEMORAL BYPASS GRAFT  03/2010   ascending aortic and arch aneurysm repair/notes 04/09/2009   CARDIAC VALVE REPLACEMENT     CAROTID-SUBCLAVIAN BYPASS GRAFT Left 08/26/2013   Procedure: BYPASS GRAFT CAROTID-SUBCLAVIAN;  Surgeon: Nada Libman, MD;  Location: [facility] OR;  Service: Vascular;  Laterality: Left;   CATARACT EXTRACTION W/ INTRAOCULAR LENS  IMPLANT, BILATERAL Bilateral    ENDOVASCULAR STENT INSERTION N/A 08/26/2013   Procedure:  THORACIC STENT GRAFT INSERTION;  Surgeon: Nada Libman, MD;  Location: [facility] OR;  Service: Vascular;  Laterality: N/A;   EYE SURGERY     INGUINAL HERNIA REPAIR Left 12/02/2019   Procedure: REPAIR LEFT INGUINAL HERNIA WITH MESH;  Surgeon: Violeta Gelinas, MD;  Location: [facility] OR;  Service: General;  Laterality: Left;   INGUINAL HERNIA REPAIR Left 12/18/2019   Procedure: LAPAROSCOPIC ASSISTED REPAIR OF RECURRENT INCARCERATED LEFT INGUINAL HERNIA WITH MESH; LAPAOSCOPIC REPAIR OF SEROSAL TEAR;  Surgeon: Gaynelle Adu, MD;  Location: [facility] OR;  Service: General;  Laterality: Left;   INSERTION OF MESH Left 12/02/2019   Procedure: INSERTION OF MESH;  Surgeon: Violeta Gelinas, MD;  Location: [facility] OR;  Service: General;  Laterality: Left;   JOINT REPLACEMENT     [name] ARTHROSCOPY Bilateral    REPLACEMENT TOTAL [name] Bilateral    right groin lymphocele Right 03/29/2009   RIGHT/LEFT HEART CATH AND CORONARY ANGIOGRAPHY N/A 10/06/2019   Procedure: RIGHT/LEFT HEART CATH AND CORONARY ANGIOGRAPHY;  Surgeon: Lyn Records, MD;  Location: [facility] INVASIVE CV LAB;  Service: Cardiovascular;  Laterality: N/A;   THORACENTESIS  2010 X 2   TONSILLECTOMY      Social:  Lives With: Wife Occupation: Retired Support: Home health once a week: PT/OT twice a week.  Level of  Function: Does not meet ADLs or iADLs. PCP: Dr Maryjean Ka MD in [place]. Substances: None  Family History:   Denies family history of diabetes, stroke, and cancer.  Allergies: Allergies as of 02/11/2023 - Review Complete 02/11/2023  Allergen Reaction Noted   Lortab [hydrocodone-acetaminophen] Hives 05/30/2012   Morphine and codeine Hives 05/18/2011   Other Other (See Comments) 09/04/2019    Review of Systems: A complete ROS was negative except as per HPI.   OBJECTIVE:   Physical Exam: Blood pressure (!) 107/49, pulse 85, temperature (!) 97.4 F (36.3 C), resp. rate (!) 23, weight 163 lb 12.8 oz (74.3 kg), SpO2 100%.   Constitutional: Chronically ill-appearing man laying in bed under multiple blankets HEENT: Normocephalic, atraumatic Cardiovascular: Systolic murmur heard at fourth/fifth left intercostal space, S1, S2 no rubs no gallops. Respiratory: Slightly decreased lung sounds at bases.  Otherwise CTAB Abdominal: Bowel sounds present, no tenderness in 4 quadrants to soft palpation. MSK: Bilateral lower extremities ischemic appearing, with multiple sores.  Right foot shows exposed bone on second digit.  Faint  right dorsalis pedis pulse found on Doppler, no other lower extremity pulses palpated/observable by Doppler.  Strength grossly diminished throughout, requires assistance to sit up.  Strength is 3 out of 5 bilateral lower extremities.  Palpable effusion over left ankle joint.  Bandage over sacral decubitus ulcer.  Sensation intact.  No warmth noted. Neuro: Grossly intact Psych: Lethargic, soft voice, appropriate mood and affect, oriented to self, place, situation   Labs: CBC    Component Value Date/Time   WBC 8.8 02/11/2023 0757   RBC 2.71 (L) 02/11/2023 0757   HGB 8.3 (L) 02/11/2023 0757   HGB 9.4 (L) 06/23/2022 1617   HCT 29.1 (L) 02/11/2023 0757   HCT 30.3 (L) 06/23/2022 1617   PLT 284 02/11/2023 0757   PLT 258 06/23/2022 1617   MCV 107.4 (H) 02/11/2023  0757   MCV 97 06/23/2022 1617   MCH 30.6 02/11/2023 0757   MCHC 28.5 (L) 02/11/2023 0757   RDW 24.3 (H) 02/11/2023 0757   RDW 14.7 06/23/2022 1617   LYMPHSABS 0.4 (L) 02/11/2023 0757   MONOABS 0.4 02/11/2023 0757   EOSABS 0.0 02/11/2023 0757   BASOSABS 0.0 02/11/2023 0757     CMP     Component Value Date/Time   NA 144 02/11/2023 0757   NA 145 (H) 06/23/2022 1617   K 6.1 (H) 02/11/2023 0910   CL 115 (H) 02/11/2023 0757   CO2 19 (L) 02/11/2023 0757   GLUCOSE 112 (H) 02/11/2023 0757   BUN 56 (H) 02/11/2023 0757   BUN 21 06/23/2022 1617   CREATININE 2.17 (H) 02/11/2023 0757   CALCIUM 8.9 02/11/2023 0757   PROT 6.4 (L) 02/11/2023 0757   PROT 6.5 06/23/2022 1617   ALBUMIN 2.5 (L) 02/11/2023 0757   ALBUMIN 3.9 06/23/2022 1617   AST 319 (H) 02/11/2023 0757   ALT 157 (H) 02/11/2023 0757   ALKPHOS 187 (H) 02/11/2023 0757   BILITOT 1.3 (H) 02/11/2023 0757   BILITOT 0.3 06/23/2022 1617   GFRNONAA 29 (L) 02/11/2023 0757   GFRAA 60 07/13/2020 1103    Imaging:  EKG: A-fib without RVR.  QTc: 541.  ASSESSMENT & PLAN:   Assessment & Plan by Problem: Principal Problem:   Altered mental state   [name] [name] is a 82 y.o. person living with a history of A-fib on Eliquis, recent MRSA pneumonia/bacteremia, just of heart failure, PAD with chronic lower extremity and sacral wounds, who presented with worsening p.o. intake, nausea and vomiting and admitted for suspected bacteremia on hospital day 0.  #Concern for bacteremia of unknown etiology #Failure to thrive #Lethargy #Nausea/vomiting Recent presentation of nausea, vomiting, lethargy and failure to thrive likely due to gram-negative bacteremia secondary to one of his multiple open wounds (R second digit, sacral decubitus ulcer). Joint effusion of the left ankle presents likeliest source, MRI order placed.  Chest x-ray showed bilateral pleural effusions, unlikely primary source at this time as patient does not currently express  significant respiratory symptoms.   Blood cultures drawn Begun on broad-spectrum IV antibiotics.  Wound care consult placed. - Consult placed to ID for antibiotic management, appreciate recs. - Continue IV cefepime 2 g daily (8/25 -). - Continue IV vancomycin 1 g every 36 hours (8/25 -). - Continue IV Flagyl 500 mg every 12 hours (8/25 -). - Await blood cultures  #PAD complicated by multiple chronic wounds (bilateral lower extremity, sacral) #Joint effusion of the left ankle Previous ABIs showed vascular insufficiency since the right second digit, making him a poor candidate  for toe amputation.  Could consider BKA if perfusion permits, although per family patient is not amenable to amputation at this time. - Consult placed to wound care, appreciate recs. - Consult ortho for further study/aspiration of joint effusion, currently following. - Ordered MRI left ankle without contrast.  #Atrial fibrillation #QTc prolongation Currently in Afib. Not in RPR. Qtc: 541. Avoid giving further Zofran and other QTc prolonging medications. - Continue home Eliquis 2.5 mg twice daily - Repeat EKG 8/27.  #AKI #Hyperkalemia BUN/CR: 56/2.17. Baseline cr is 1.8. K: 6.1.  Begun temporizing measures with calcium, insulin/d50, and lokelma. Likely secondary to worsening p.o. intake. - Given calcium gluconate 1 g in the ED. - Given insulin aspart 10 units x1 with 1 bolus of D50 simultaneously. - Begin IV LR with dextrose 5% 75 mL/hr over 10 hours. - Begin q2h CBG monitoring and setting of insulin administration. - Continue home midodrine 10 mg 3 times daily to increase perfusion. - Trend potassium levels every 6 hours. - Await CMP morning labs  #History of aortic valve replacement #History of mitral insufficiency Patient also has a history of aortic valve replacement and mitral insufficiency, increasing risk that these were seeded by his previous MRSA bacteremia.  Will follow up with TTE as patient is unlikely  to tolerate TEE. Blood cultures drawn as above. -Ordered transthoracic echocardiogram  #Left greater than right pleural effusions #CHF (35-40%) Patient appeared largely euvolemic.  Somewhat reduced lung sounds in the bases. - Continue PO Lasix 20 mg daily as needed for hypervolemia.  #Elevated LFTs Unknown etiology at this time, perhaps reactive to underlying infection. Less likely cholestatic as abdominal exam negative, no clinical signs of jaundice. - Ordered US abdomen RUQ on ID recs.  #Macrocytic anemia In setting of poor PO.  - Await vitamin B9/B12 labs  #Goals of care - Patient was made DNR/DNI on previous hospital admission.  Diet: Normal VTE: Home Eliquis 2.5 twice daily IVF: LR 75cc/hr Code: DNR  Prior to Admission Living Arrangement: Home, living wife Anticipated Discharge Location: Home Barriers to Discharge: Identification and treatment of infectious source  Dispo: Admit patient to Inpatient with expected length of stay greater than 2 midnights.  Signed: Tomie China, MD Psychiatry Resident PGY-1  02/11/2023, 1:14 PM

## 2023-02-11 NOTE — ED Provider Notes (Signed)
[place] EMERGENCY DEPARTMENT AT [facility] Provider Note   CSN: [id number] Arrival date & time: 02/11/23  1914     History  Chief Complaint  Patient presents with   Emesis   Fatigue    [name] [name] is a 82 y.o. male.  82 yo M with a chief complaints of fatigue nausea and vomiting.  This been going on since yesterday.  The patient sounds like he is not done exceptionally well since he came down with MRSA pneumonia.  He has been in and out of the hospital since.  He developed an infection to his left foot and was told to make it nonweightbearing and so has been spending most of his time in the bed.  They tried some medications he had at home for his nausea with transient improvement.  He denies any abdominal pain denies chest pain.  Denies cough or congestion.  Is still having some pain to the left leg.   Emesis      Home Medications Prior to Admission medications   Medication Sig Start Date End Date Taking? Authorizing Provider  acetaminophen (TYLENOL) 500 MG tablet Take 1 tablet (500 mg total) by mouth every 6 (six) hours as needed. 12/22/22   Love, Evlyn Kanner, PA-C  albuterol (PROVENTIL) (2.5 MG/3ML) 0.083% nebulizer solution Take by nebulization. 06/22/22   [provider]  amiodarone (PACERONE) 200 MG tablet Take 1 tablet (200 mg total) by mouth daily. 07/12/22   [name], MD  amoxicillin (AMOXIL) 500 MG capsule SMARTSIG:4 Capsule(s) By Mouth Once 01/30/23   [provider]  apixaban (ELIQUIS) 2.5 MG TABS tablet Take 1 tablet (2.5 mg total) by mouth 2 (two) times daily. 01/23/23   [name], MD  ascorbic acid (VITAMIN C) 500 MG tablet Take 1 tablet (500 mg total) by mouth daily. 12/22/22   Love, Evlyn Kanner, PA-C  aspirin 81 MG chewable tablet Chew 1 tablet (81 mg total) by mouth daily. 12/09/22   Burnadette Pop, MD  doxycycline (VIBRA-TABS) 100 MG tablet Take 1 tablet (100 mg total) by mouth every 12 (twelve) hours. 12/22/22   Love,  Evlyn Kanner, PA-C  feeding supplement (ENSURE ENLIVE / ENSURE PLUS) LIQD Take 237 mLs by mouth 3 (three) times daily between meals. 12/08/22   Burnadette Pop, MD  ferrous sulfate (SV IRON) 325 (65 FE) MG tablet Take 1 tablet (325 mg total) by mouth daily with breakfast. 12/22/22   Love, Evlyn Kanner, PA-C  furosemide (LASIX) 20 MG tablet Take 20 mg by mouth daily as needed. 01/08/23   [provider]  lansoprazole (PREVACID) 30 MG capsule Take 30 mg by mouth daily. 01/30/23   [provider]  levocetirizine (XYZAL) 5 MG tablet Take 5 mg by mouth daily. 04/21/21   [provider]  linezolid (ZYVOX) 600 MG tablet Take 600 mg by mouth 2 (two) times daily. 02/07/23   [provider]  megestrol (MEGACE) 40 MG tablet Take 40 mg by mouth 2 (two) times daily. 01/09/23   [provider]  melatonin 5 MG TABS Take 1 tablet (5 mg total) by mouth at bedtime as needed. 12/22/22   Love, Evlyn Kanner, PA-C  midodrine (PROAMATINE) 10 MG tablet Take 1 tablet (10 mg total) by mouth 3 (three) times daily with meals. 02/07/23   [name], MD  Multiple Vitamin (MULTIVITAMIN WITH MINERALS) TABS tablet Take 1 tablet by mouth daily.    [provider]  ondansetron (ZOFRAN) 4 MG tablet  Take 1 tablet (4 mg total) by mouth daily as needed for nausea or vomiting. 12/23/22 12/23/23  Kirsteins, Victorino Sparrow, MD  pantoprazole (PROTONIX) 40 MG tablet Take 1 tablet (40 mg total) by mouth 2 (two) times daily. 12/22/22   Love, Evlyn Kanner, PA-C  potassium chloride SA (KLOR-CON M) 20 MEQ tablet Take 20-40 mEq by mouth daily. 01/09/23   [provider]  saccharomyces boulardii (FLORASTOR) 250 MG capsule Take 1 capsule (250 mg total) by mouth 2 (two) times daily. 12/22/22   Love, Evlyn Kanner, PA-C  senna (SENOKOT) 8.6 MG TABS tablet Take 1 tablet (8.6 mg total) by mouth 2 (two) times daily. 12/22/22   Love, Evlyn Kanner, PA-C  tamsulosin (FLOMAX) 0.4 MG CAPS capsule Take 0.4 mg by mouth.    [provider]  traMADol (ULTRAM) 50 MG tablet Take 50 mg by mouth every 6 (six) hours as needed for moderate pain. 11/27/22   [provider]  Vitamin D, Ergocalciferol, (DRISDOL) 1.25 MG (50000 UNIT) CAPS capsule Take 1 capsule by mouth once a week 01/23/23   Raulkar, Drema Pry, MD      Allergies    Lortab [hydrocodone-acetaminophen], Morphine and codeine, and Other    Review of Systems   Review of Systems  Gastrointestinal:  Positive for vomiting.    Physical Exam Updated Vital Signs BP (!) 107/49   Pulse 85   Temp (!) 97.4 F (36.3 C)   Resp (!) 23   Wt 74.3 kg   SpO2 100%   BMI 23.50 kg/m  Physical Exam Vitals and nursing note reviewed.  Constitutional:      Appearance: He is well-developed.  HENT:     Head: Normocephalic and atraumatic.  Eyes:     Pupils: Pupils are equal, round, and reactive to light.  Neck:     Vascular: No JVD.  Cardiovascular:     Rate and Rhythm: Normal rate and regular rhythm.     Heart sounds: Murmur (best heard mitral pole) heard.     No friction rub. No gallop.  Pulmonary:     Effort: No respiratory distress.     Breath sounds: No wheezing.     Comments: Tachypnea Abdominal:     General: There is no distension.     Tenderness: There is no abdominal tenderness. There is no guarding or rebound.  Musculoskeletal:        General: Normal range of motion.     Cervical back: Normal range of motion and neck supple.     Comments: Ischemic appearing digits on the left foot at the plantar aspect.  No obvious palpable pulse.  Foot is warm.  Right foot with exposed bone from the second digit.  No obvious erythema or warmth.  Skin:    Coloration: Skin is not pale.     Findings: No rash.  Neurological:     Mental Status: He is alert and oriented to person, place, and time.  Psychiatric:        Behavior: Behavior normal.     ED Results / Procedures / Treatments   Labs (all labs ordered are listed, but only abnormal results are  displayed) Labs Reviewed  COMPREHENSIVE METABOLIC PANEL - Abnormal; Notable for the following components:      Result Value   Potassium 6.6 (*)    Chloride 115 (*)    CO2 19 (*)    Glucose, Bld 112 (*)    BUN 56 (*)    Creatinine, Ser 2.17 (*)  Total Protein 6.4 (*)    Albumin 2.5 (*)    AST 319 (*)    ALT 157 (*)    Alkaline Phosphatase 187 (*)    Total Bilirubin 1.3 (*)    GFR, Estimated 29 (*)    All other components within normal limits  CBC WITH DIFFERENTIAL/PLATELET - Abnormal; Notable for the following components:   RBC 2.71 (*)    Hemoglobin 8.3 (*)    HCT 29.1 (*)    MCV 107.4 (*)    MCHC 28.5 (*)    RDW 24.3 (*)    Neutro Abs 7.9 (*)    Lymphs Abs 0.4 (*)    Abs Immature Granulocytes 0.08 (*)    All other components within normal limits  PROTIME-INR - Abnormal; Notable for the following components:   Prothrombin Time 27.6 (*)    INR 2.5 (*)    All other components within normal limits  APTT - Abnormal; Notable for the following components:   aPTT 39 (*)    All other components within normal limits  POTASSIUM - Abnormal; Notable for the following components:   Potassium 6.1 (*)    All other components within normal limits  I-STAT CG4 LACTIC ACID, ED - Abnormal; Notable for the following components:   Lactic Acid, Venous 2.9 (*)    All other components within normal limits  I-STAT CG4 LACTIC ACID, ED - Abnormal; Notable for the following components:   Lactic Acid, Venous 2.9 (*)    All other components within normal limits  CULTURE, BLOOD (ROUTINE X 2)  CULTURE, BLOOD (ROUTINE X 2)  SARS CORONAVIRUS 2 BY RT PCR  URINALYSIS, W/ REFLEX TO CULTURE (INFECTION SUSPECTED)    EKG EKG Interpretation Date/Time:  Sunday February 11 2023 09:18:56 EDT Ventricular Rate:  83 PR Interval:    QRS Duration:  162 QT Interval:  460 QTC Calculation: 541 R Axis:   149  Text Interpretation: Atrial fibrillation Nonspecific intraventricular conduction delay Minimal ST  depression, diffuse leads st depression seen on inferior and lateral previously No significant change since last tracing Confirmed by Melene Plan [phone]) on 02/11/2023 9:23:37 AM  Radiology DG Ankle Complete Left  Result Date: 02/11/2023 CLINICAL DATA:  Wound infection. EXAM: LEFT ANKLE COMPLETE - 3+ VIEW COMPARISON:  Left foot radiographs 11/20/2022 at [facility] FINDINGS: Soft tissue swelling is slightly more prominent medial than lateral. No acute or focal osseous abnormalities are present. A moderate-sized joint effusion is present. Mild osteopenia is noted. Plantar calcaneal spur is again seen. IMPRESSION: 1. Soft tissue swelling and moderate-sized joint effusion without acute or focal osseous abnormality. 2. Plantar calcaneal spur. 3. Mild osteopenia. Electronically Signed   By: Marin Roberts M.D.   On: 02/11/2023 08:49   DG Chest Port 1 View  Result Date: 02/11/2023 CLINICAL DATA:  Left ankle wound infection.  Question sepsis. EXAM: PORTABLE CHEST 1 VIEW COMPARISON:  Two-view chest x-ray 12/22/2022 FINDINGS: Heart is enlarged. Aortic stent grafting noted. Left greater than right pleural effusions are again seen. Mild pulmonary vascular congestion is noted. Bibasilar airspace opacities are present, left greater than right. Atherosclerotic calcifications are noted within the distal subclavian and axillary arteries. Degenerative changes are present in the shoulders, right greater than left. IMPRESSION: 1. Cardiomegaly and mild pulmonary vascular congestion without frank edema. 2. Left greater than right pleural effusions and bibasilar airspace disease, left greater than right. While this may represent atelectasis, infection is not excluded. Electronically Signed   By: Virl Son.D.  On: 02/11/2023 08:34    Procedures .Critical Care  Performed by: Melene Plan, DO Authorized by: Melene Plan, DO   Critical care provider statement:    Critical care time (minutes):  35    Critical care time was exclusive of:  Separately billable procedures and treating other patients   Critical care was time spent personally by me on the following activities:  Development of treatment plan with patient or surrogate, discussions with consultants, evaluation of patient's response to treatment, examination of patient, ordering and review of laboratory studies, ordering and review of radiographic studies, ordering and performing treatments and interventions, pulse oximetry, re-evaluation of patient's condition and review of old charts   Care discussed with: admitting provider      Procedure note: Ultrasound Guided Peripheral IV Ultrasound guided peripheral 1.88 inch angiocath IV placement performed by me. Indications: Nursing unable to place IV. Details: The antecubital fossa and upper arm were evaluated with a multifrequency linear probe. Patent brachial veins were noted. 1 attempt was made to cannulate a vein under realtime US guidance with successful cannulation of the vein and catheter placement. There is return of non-pulsatile dark red blood. The patient tolerated the procedure well without complications. Images archived electronically.  CPT codes: 95621 and [phone]    Medications Ordered in ED Medications  calcium gluconate 1 g/ 50 mL sodium chloride IVPB (1,000 mg Intravenous New Bag/Given 02/11/23 0949)  vancomycin (VANCOREADY) IVPB 1250 mg/250 mL (has no administration in time range)  metroNIDAZOLE (FLAGYL) IVPB 500 mg (has no administration in time range)  lactated ringers bolus 1,000 mL (1,000 mLs Intravenous New Bag/Given 02/11/23 0849)  ondansetron (ZOFRAN) injection 4 mg (4 mg Intravenous Given 02/11/23 0846)  ceFEPIme (MAXIPIME) 2 g in sodium chloride 0.9 % 100 mL IVPB (0 g Intravenous Stopped 02/11/23 1019)    ED Course/ Medical Decision Making/ A&P                                 Medical Decision Making Amount and/or Complexity of Data Reviewed Labs:  ordered. Radiology: ordered. ECG/medicine tests: ordered.  Risk Prescription drug management. Decision regarding hospitalization.   82 yo M with a chief complaints of nausea vomiting and decreased oral intake and fatigue.  Going on for a couple days.  Patient recently admitted with MRSA pneumonia.  Has been home with home health.  Has multiple wounds that family feels were slowly improving.  Patient's labs have resulted, lactate elevated 2.9.  Patient has hyperkalemia though may be due to myolysis.  Will repeat.  Give boluses of calcium.  His renal function is mildly worse from prior.  No leukocytosis hemoglobin higher than baseline.  Chest x-ray independently interpreted by me with some cardiomegaly and vascular congestion.  Pleural effusions bilaterally though radiology with possible pneumonia.  Plain film of the left ankle independently interpreted  by me without obvious osteomyelitis.  With patient not doing well clinically worsening renal function not eating or drinking at home possible pneumonia I wonder if the patient is having ongoing infection.  There was some concern for endocarditis which also would consider.  Will cover broadly with antibiotics as he is already on linezolid.  Will discuss with medicine.  The patients results and plan were reviewed and discussed.   Any x-rays performed were independently reviewed by myself.   Differential diagnosis were considered with the presenting HPI.  Medications  calcium gluconate 1 g/ 50 mL sodium chloride  IVPB (1,000 mg Intravenous New Bag/Given 02/11/23 0949)  vancomycin (VANCOREADY) IVPB 1250 mg/250 mL (has no administration in time range)  metroNIDAZOLE (FLAGYL) IVPB 500 mg (has no administration in time range)  lactated ringers bolus 1,000 mL (1,000 mLs Intravenous New Bag/Given 02/11/23 0849)  ondansetron (ZOFRAN) injection 4 mg (4 mg Intravenous Given 02/11/23 0846)  ceFEPIme (MAXIPIME) 2 g in sodium chloride 0.9 % 100 mL IVPB (0 g  Intravenous Stopped 02/11/23 1019)    Vitals:   02/11/23 0707 02/11/23 0709 02/11/23 1000  BP:  (!) 106/57 (!) 107/49  Pulse:  95 85  Resp:  18 (!) 23  Temp:  (!) 97.4 F (36.3 C)   SpO2:  95% 100%  Weight: 74.3 kg      Final diagnoses:  Failure to thrive in adult  AKI (acute kidney injury) (HCC)    Admission/ observation were discussed with the admitting physician, patient and/or family and they are comfortable with the plan.            Final Clinical Impression(s) / ED Diagnoses Final diagnoses:  Failure to thrive in adult  AKI (acute kidney injury) [facility])    Rx / DC Orders ED Discharge Orders     None         Melene Plan, DO 02/11/23 1048

## 2023-02-11 NOTE — Progress Notes (Signed)
Hypoglycemic Event  CBG: 50  Treatment: D50 50 mL (25 gm)  Symptoms: None  Follow-up CBG: Time:1910 CBG Result:140   Comments/MD notified: MD notified at 1850.     Carie Caddy

## 2023-02-11 NOTE — Consult Note (Signed)
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 and ischemic findings and toes including protruding exposed bone and second digit on the right with concern of involvement of his aortic graft and aortic valve replacement on chronic doxycycline Admission after increasing lethargy nausea vomiting and a fall Cholestatic hepatitis picture Hyperkalemia Left ankle joint effusion Chronic hypotension on midodrine Sacral decubitus ulcer Atrial fibrillation CAD  Plan:  Continue his current antibiotics of vancomycin cefepime and Flagyl for now  I ordered right upper quadrant ultrasound but I think a CT of the abdomen would be wise as well as CT of the chest both without IV contrast I have ordered MRI of the ankle wo contrast I would ask Orthopedics frayed his left ankle effusion for cell count differential crystals and culture.  It may very well be a traumatic effusion with blood rather than infected joint but is difficult to know since he cannot give a precise history. Family requests special air mattress to prevent his decubitus ulcer with him getting worse Would reengage with inpatient palliative care team as well.  Dr Elinor Parkinson and I believe Marcos Eke, NP will be here tomorrow  Principal Problem:   Altered mental state   Scheduled Meds:  amiodarone  200 mg Oral Daily   apixaban  2.5 mg Oral BID   [START ON 02/12/2023] aspirin  81 mg Oral Daily   insulin aspart  10 Units Intravenous Once   megestrol  40 mg Oral BID   midodrine  10 mg Oral TID WC   [START ON 02/12/2023] pantoprazole  40 mg Oral Daily   senna  1 tablet Oral BID   sodium zirconium cyclosilicate  10 g Oral Once   Continuous Infusions:  [START ON 02/12/2023] ceFEPime (MAXIPIME) IV     dextrose 5% lactated ringers     metronidazole      [START ON 02/13/2023] vancomycin     PRN Meds:.acetaminophen, furosemide, traMADol, trimethobenzamide  HPI: [name] [name] is a 82 y.o. male with multiple medical problems including coronary artery disease, heart failure aortic aneurysm status post aortic arch replacement aortic valve replacement, and thoracic stent who was admitted to [facility] this summer with disseminated MRSA infection with cavitary pneumonia and oral effusions (that were transudative )with source of his bacteremia thought to be ischemic pathology in his feet bilaterally with multiple ulcers.  He also has a bone that protrudes from his second digit on the right.  Patient was seen by Dr. Aldean Baker but because the patient has severe peripheral vascular disease by ABIs no surgical interventions have been offered to the patient.  He was not able to have a complete workup for endocarditis with his bacteremia because his cardiologist does not feel that a TEE would be safe.  There has been concerned that his aortic valve and aortic graft may have been seeded by his MRSA bacteremia and he is therefore been on chronic doxycycline.  He recently saw Dr. Marylene Land with podiatry who sounds to have taken a surface swab culture from the patient's left second toe.  These culture data were faxed to our clinic and Rexene Alberts and reviewed them.  They are apparently scanned into the computer but I cannot locate them.  [name] recommended the patient receive a  brief course of levofloxacin.  It appears that this recommendation was lost in translation as the patient was prescribed linezolid instead of levofloxacin and continue to take linezolid with doxycycline.  In the last several days he has become increasingly lethargic with worsening nausea and vomiting.  He fell and twisted his ankle and has had heel pain and now on exam has obvious ankle pain.  Plain films of the ankle do show a moderate-sized effusion.  He had  blood cultures taken on admission which are not growing any organisms yet.  He has been started on broad-spectrum antibiotics to clinic vancomycin and cefepime and Flagyl.  His admission labs are notable for kalemia with a potassium of 6.6.  His creatinine is 2.17 which is in line where it has been in recent months.  He also has elevated transaminases with an ALT and AST that are up in the 319 and 157 range alkaline phosphatase up to 187 with a bilirubin of 1.3  Chest t x-ray shows a moderate left and trace right pleural effusion.   Patient has received IV fluids and antibiotics and does feel better and is less lethargic than he was before.  I think that needs imaging to workup his elevated liver function test that have a static picture.  I ordered a right upper quadrant sound but a CT scan be a good idea.  It also be reasonable to image his lungs with a CT without contrast.  With regards to his ankle where he has an effusion and tenderness on exam this needs evaluation as well.  I have ordered an MRI of the left ankle.  I would also ask orthopedic surgery to aspirate the joint for cell count differential crystals and culture.  Certainly the joint fluid may be blood from trauma since he recently fell but he cannot recall if his ankle pain existed prior to his fall or only after his fall.  I have personally spent 84 minutes involved in face-to-face and non-face-to-face activities for this patient on the day of the visit. Professional time spent includes the following activities: Preparing to see the patient (review of tests), Obtaining and/or reviewing separately obtained history (admission/discharge record), Performing a medically appropriate examination and/or evaluation , Ordering medications/tests/procedures, referring and communicating with other health care professionals, Documenting clinical information in the EMR, Independently interpreting results (not separately reported), Communicating  results to the patient/family/caregiver, Counseling and educating the patient/family/caregiver and Care coordination (not separately reported).      Review of Systems: Review of Systems  Unable to perform ROS: Acuity of condition    Past Medical History:  Diagnosis Date   Acute on chronic diastolic heart failure (HCC) 08/22/2013   Acute on chronic respiratory failure with hypoxia (HCC) 10/03/2019   Acute on chronic systolic heart failure (HCC) 05/11/2013   EF from 35% to 50-55% on 6/14 ECHO.    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 No evidence of dissection or endoleak.  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 No evidence of abdominal aortic dissection.  Occluded proximal left subclav   Aortic valve insufficiency    Arthritis    "probably in my knees  before they replaced them" (01/28/2015)   Atrial fibrillation (HCC) 05/21/2013   Atrial flutter (HCC)    Bradycardia 08/23/2013   CAD (coronary artery disease)    Chest pain 01/28/2015   CHF (congestive heart failure) (HCC)    Chronic diastolic heart failure (HCC) 01/28/2015   Descending aortic aneurysm (HCC)    DVT (deep venous thrombosis) (HCC)    ?LLE post knee surgery   Dyspnea 09/2019   HCAP (healthcare-associated pneumonia) 10/11/2013   History of blood transfusion    "after one of my knee surgeries"   HTN (hypertension)    Hyperlipidemia    Hypotension 12/17/2019   Incarcerated left inguinal hernia s/p repair 12/02/2019 12/01/2019   Insomnia 09/04/2013   Multiple fractures of ribs, right side, init for clos fx 09/04/2019   Near syncope 01/28/2015    Pressure injury of skin 10/06/2019   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 12/18/2019 12/17/2019   Recurrent UTI 10/06/2013   Shock circulatory (HCC) 10/07/2013   Thoracic aneurysm    Tobacco abuse    Urinary retention 10/06/2013   UTI (urinary tract infection) 10/06/2013    Social History   Tobacco Use   Smoking status: Former    Current packs/day: 0.00    Types: Cigarettes    Quit date: 04/16/1974    Years since quitting: 48.8   Smokeless tobacco: Never  Vaping Use   Vaping status: Never Used  Substance Use Topics   Alcohol use: No   Drug use: No    Family History  Problem Relation Age of Onset   Celiac disease Daughter    Aortic aneurysm Father    Hypertension Mother    Heart attack Neg Hx    Stroke Neg Hx    Allergies  Allergen Reactions   Lortab [Hydrocodone-Acetaminophen] Hives    No trouble breathing   Morphine And Codeine Hives   Other Other (See Comments)    Staples from surgery caused infection    OBJECTIVE: Blood pressure (!) 96/46, pulse 81, temperature (!) 97.4 F (36.3 C), resp. rate (!) 25, weight 74.3 kg, SpO2 98%.  Physical Exam Constitutional:      Appearance: He is ill-appearing.  HENT:     Head: Normocephalic and atraumatic.  Eyes:     General:        Right eye: No discharge.        Left eye: No discharge.     Extraocular Movements: Extraocular movements intact.  Cardiovascular:     Rate and Rhythm: Normal rate. Rhythm irregular.  Pulmonary:     Effort: No respiratory distress.     Breath sounds: No wheezing.  Abdominal:     General: There is no distension.  Musculoskeletal:     Left lower leg: Swelling and tenderness present.  Skin:    Coloration: Skin is pale.     Findings: Erythema present.  Neurological:     General: No focal deficit present.     Mental Status: He is alert.    Feet             Lab Results Lab Results  Component Value Date   WBC 8.8 02/11/2023   HGB 8.3 (L)  02/11/2023   HCT 29.1 (L) 02/11/2023   MCV 107.4 (H) 02/11/2023   PLT 284 02/11/2023    Lab Results  Component Value Date   CREATININE 2.17 (H) 02/11/2023   BUN 56 (H) 02/11/2023   NA 144 02/11/2023   K 6.1 (H)  02/11/2023   CL 115 (H) 02/11/2023   CO2 19 (L) 02/11/2023    Lab Results  Component Value Date   ALT 157 (H) 02/11/2023   AST 319 (H) 02/11/2023   ALKPHOS 187 (H) 02/11/2023   BILITOT 1.3 (H) 02/11/2023     Microbiology: Recent Results (from the past 240 hour(s))  Blood Culture (routine x 2)     Status: None (Preliminary result)   Collection Time: 02/11/23  8:00 AM   Specimen: BLOOD  Result Value Ref Range Status   Specimen Description BLOOD RIGHT ANTECUBITAL  Final   Special Requests   Final    BOTTLES DRAWN AEROBIC AND ANAEROBIC Blood Culture adequate volume   Culture   Final    NO GROWTH <12 HOURS Performed at [facility], [address] [place], [place] [address]    Report Status PENDING  Incomplete  Blood Culture (routine x 2)     Status: None (Preliminary result)   Collection Time: 02/11/23  8:00 AM   Specimen: BLOOD LEFT HAND  Result Value Ref Range Status   Specimen Description BLOOD LEFT HAND  Final   Special Requests   Final    BOTTLES DRAWN AEROBIC AND ANAEROBIC Blood Culture adequate volume   Culture   Final    NO GROWTH <12 HOURS Performed at [facility], [address], [place], [place] [address]    Report Status PENDING  Incomplete    Acey Lav, MD [facility] [phone] pager  02/11/2023, 1:00 PM

## 2023-02-11 NOTE — H&P (View-Only) (Signed)
Orthopaedic Trauma Service (OTS) Consult   Patient ID: [name] [name] MRN: [id number] DOB/AGE: [birth year] 82 y.o.   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  He has been dealing with sacral decubitus ulcer and bilateral lower extremity ulcerations including all right second toe ulceration with exposed bone as well as serrations of his left toes being treated by podiatry in [place] as well as various other wounds to his lower extremities in various stages of healing due to poor blood flow to his lower extremities who was admitted this morning for altered mental status, concern for bacteremia.  Plain x-rays of his left ankle were obtained due to ankle pain which showed concern for joint effusion.  Orthopedics consulted for aspiration MRI was obtained prior to consultation as well which confirmed effusion.  No abscesses identified on MRI  Patient has been on numerous antibiotics over the past several months including prophylactic doxycycline  Patient seen and evaluated into C6.  Wife is at bedside patient appears very deconditioned.  Speaks a little bit but not too much.  He is sitting up in bed trying to eat  Wife has been performing dressing changes to his feet at home regularly cleaning with Betadine and covering wounds with Xeroform and gauze.  States that husband does ambulate with a walker   Please see internal medicine resident H&P for full summary of his clinical course over the last 6 to 8 weeks  Past Medical History:   Diagnosis Date   Acute on chronic diastolic heart failure (HCC) 08/22/2013   Acute on chronic respiratory failure with hypoxia (HCC) 10/03/2019   Acute on chronic systolic heart failure (HCC) 05/11/2013   EF from 35% to 50-55% on 6/14 ECHO.    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 No evidence of dissection or endoleak.  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 No evidence of abdominal aortic dissection.  Occluded proximal left subclav   Aortic valve insufficiency    Arthritis    "probably in my knees before they replaced them" (01/28/2015)   Atrial fibrillation (HCC) 05/21/2013   Atrial flutter (HCC)    Bradycardia 08/23/2013   CAD (coronary artery disease)    Chest pain 01/28/2015   CHF (congestive heart failure) (HCC)    Chronic diastolic heart failure (HCC) 01/28/2015   Descending aortic aneurysm (HCC)    DVT (deep venous thrombosis) (HCC)    ?LLE post knee surgery   Dyspnea 09/2019   HCAP (healthcare-associated pneumonia) 10/11/2013   History of blood transfusion    "after one of my knee surgeries"   HTN (hypertension)    Hyperlipidemia    Hypotension 12/17/2019   Incarcerated left inguinal hernia s/p repair 12/02/2019 12/01/2019   Insomnia 09/04/2013  Multiple fractures of ribs, right side, init for clos fx 09/04/2019   Near syncope 01/28/2015   Pressure injury of skin 10/06/2019   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 12/18/2019 12/17/2019   Recurrent UTI 10/06/2013   Shock circulatory (HCC) 10/07/2013   Thoracic aneurysm    Tobacco abuse    Urinary retention 10/06/2013    UTI (urinary tract infection) 10/06/2013    Past Surgical History:  Procedure Laterality Date   AORTIC VALVE REPLACEMENT  02/19/2009   AORTO-FEMORAL BYPASS GRAFT  03/2010   ascending aortic and arch aneurysm repair/notes 04/09/2009   CARDIAC VALVE REPLACEMENT     CAROTID-SUBCLAVIAN BYPASS GRAFT Left 08/26/2013   Procedure: BYPASS GRAFT CAROTID-SUBCLAVIAN;  Surgeon: Nada Libman, MD;  Location: [facility] OR;  Service: Vascular;  Laterality: Left;   CATARACT EXTRACTION W/ INTRAOCULAR LENS  IMPLANT, BILATERAL Bilateral    ENDOVASCULAR STENT INSERTION N/A 08/26/2013   Procedure:  THORACIC STENT GRAFT INSERTION;  Surgeon: Nada Libman, MD;  Location: [facility] OR;  Service: Vascular;  Laterality: N/A;   EYE SURGERY     INGUINAL HERNIA REPAIR Left 12/02/2019   Procedure: REPAIR LEFT INGUINAL HERNIA WITH MESH;  Surgeon: Violeta Gelinas, MD;  Location: [facility] OR;  Service: General;  Laterality: Left;   INGUINAL HERNIA REPAIR Left 12/18/2019   Procedure: LAPAROSCOPIC ASSISTED REPAIR OF RECURRENT INCARCERATED LEFT INGUINAL HERNIA WITH MESH; LAPAOSCOPIC REPAIR OF SEROSAL TEAR;  Surgeon: Gaynelle Adu, MD;  Location: [facility] OR;  Service: General;  Laterality: Left;   INSERTION OF MESH Left 12/02/2019   Procedure: INSERTION OF MESH;  Surgeon: Violeta Gelinas, MD;  Location: [facility] OR;  Service: General;  Laterality: Left;   JOINT REPLACEMENT     KNEE ARTHROSCOPY Bilateral    REPLACEMENT TOTAL KNEE Bilateral    right groin lymphocele Right 03/29/2009   RIGHT/LEFT HEART CATH AND CORONARY ANGIOGRAPHY N/A 10/06/2019   Procedure: RIGHT/LEFT HEART CATH AND CORONARY ANGIOGRAPHY;  Surgeon: Lyn Records, MD;  Location: [facility] INVASIVE CV LAB;  Service: Cardiovascular;  Laterality: N/A;   THORACENTESIS  2010 X 2   TONSILLECTOMY      Family History  Problem Relation Age of Onset   Celiac disease Daughter    Aortic aneurysm Father    Hypertension Mother    Heart attack Neg Hx    Stroke Neg Hx     Social History:  reports that he  quit smoking about 48 years ago. His smoking use included cigarettes. He has never used smokeless tobacco. He reports that he does not drink alcohol and does not use drugs.  Allergies:  Allergies  Allergen Reactions   Lortab [Hydrocodone-Acetaminophen] Hives    No trouble breathing   Morphine And Codeine Hives   Other Other (See Comments)    Staples from surgery caused infection    Medications: I have reviewed the patient's current medications. Current Outpatient Medications  Medication Instructions   acetaminophen (TYLENOL) 500 mg, Oral, Every 6 hours PRN   albuterol (PROVENTIL) (2.5 MG/3ML) 0.083% nebulizer solution Nebulization   amiodarone (PACERONE) 200 mg, Oral, Daily   amoxicillin (AMOXIL) 500 MG capsule SMARTSIG:4 Capsule(s) By Mouth Once   apixaban (ELIQUIS) 2.5 mg, Oral, 2 times daily   ascorbic acid (VITAMIN C) 500 mg, Oral, Daily   aspirin 81 mg, Oral, Daily   doxycycline (VIBRA-TABS) 100 mg, Oral, Every 12 hours   feeding supplement (ENSURE ENLIVE / ENSURE PLUS) LIQD 237 mLs, Oral, 3 times daily between meals   ferrous sulfate (  SV IRON) 325 mg, Oral, Daily with breakfast   furosemide (LASIX) 20 mg, Oral, Daily PRN   lansoprazole (PREVACID) 30 mg, Oral, Daily   levocetirizine (XYZAL) 5 mg, Oral, Daily   linezolid (ZYVOX) 600 mg, Oral, 2 times daily   megestrol (MEGACE) 40 mg, Oral, 2 times daily   melatonin 5 mg, Oral, At bedtime PRN   midodrine (PROAMATINE) 10 mg, Oral, 3 times daily with meals   Multiple Vitamin (MULTIVITAMIN WITH MINERALS) TABS tablet 1 tablet, Daily   ondansetron (ZOFRAN) 4 mg, Oral, Daily PRN   pantoprazole (PROTONIX) 40 mg, Oral, 2 times daily   potassium chloride SA (KLOR-CON M) 20 MEQ tablet 20-40 mEq, Oral, Daily   saccharomyces boulardii (FLORASTOR) 250 mg, Oral, 2 times daily   senna (SENOKOT) 8.6 mg, Oral, 2 times daily   tamsulosin (FLOMAX) 0.4 mg, Oral   traMADol (ULTRAM) 50 mg, Oral, Every 6 hours PRN   Vitamin D (Ergocalciferol)  (DRISDOL) 50,000 Units, Oral, Weekly     Results for orders placed or performed during the hospital encounter of 02/11/23 (from the past 48 hour(s))  Comprehensive metabolic panel     Status: Abnormal   Collection Time: 02/11/23  7:57 AM  Result Value Ref Range   Sodium 144 135 - 145 mmol/L   Potassium 6.6 (HH) 3.5 - 5.1 mmol/L    Comment: HEMOLYSIS AT THIS LEVEL MAY AFFECT RESULT CRITICAL RESULT CALLED TO, READ BACK BY AND VERIFIED WITH C SCHILING RN AT [phone] BY D LONG    Chloride 115 (H) 98 - 111 mmol/L   CO2 19 (L) 22 - 32 mmol/L   Glucose, Bld 112 (H) 70 - 99 mg/dL    Comment: Glucose reference range applies only to samples taken after fasting for at least 8 hours.   BUN 56 (H) 8 - 23 mg/dL   Creatinine, Ser 0.10 (H) 0.61 - 1.24 mg/dL   Calcium 8.9 8.9 - 27.2 mg/dL   Total Protein 6.4 (L) 6.5 - 8.1 g/dL   Albumin 2.5 (L) 3.5 - 5.0 g/dL   AST 536 (H) 15 - 41 U/L    Comment: HEMOLYSIS AT THIS LEVEL MAY AFFECT RESULT   ALT 157 (H) 0 - 44 U/L    Comment: HEMOLYSIS AT THIS LEVEL MAY AFFECT RESULT   Alkaline Phosphatase 187 (H) 38 - 126 U/L   Total Bilirubin 1.3 (H) 0.3 - 1.2 mg/dL    Comment: HEMOLYSIS AT THIS LEVEL MAY AFFECT RESULT   GFR, Estimated 29 (L) >60 mL/min    Comment: (NOTE) Calculated using the CKD-EPI Creatinine Equation (2021)    Anion gap 10 5 - 15    Comment: Performed at [facility], [address] [place], [place] [address]  CBC with Differential     Status: Abnormal   Collection Time: 02/11/23  7:57 AM  Result Value Ref Range   WBC 8.8 4.0 - 10.5 K/uL   RBC 2.71 (L) 4.22 - 5.81 MIL/uL   Hemoglobin 8.3 (L) 13.0 - 17.0 g/dL   HCT 47.4 (L) 25.9 - 56.3 %   MCV 107.4 (H) 80.0 - 100.0 fL   MCH 30.6 26.0 - 34.0 pg   MCHC 28.5 (L) 30.0 - 36.0 g/dL   RDW 87.5 (H) 64.3 - 32.9 %   Platelets 284 150 - 400 K/uL   nRBC 0.2 0.0 - 0.2 %   Neutrophils Relative % 90 %   Neutro Abs 7.9 (H) 1.7 - 7.7 K/uL   Lymphocytes  Relative 4 %   Lymphs Abs 0.4 (L)  0.7 - 4.0 K/uL   Monocytes Relative 5 %   Monocytes Absolute 0.4 0.1 - 1.0 K/uL   Eosinophils Relative 0 %   Eosinophils Absolute 0.0 0.0 - 0.5 K/uL   Basophils Relative 0 %   Basophils Absolute 0.0 0.0 - 0.1 K/uL   WBC Morphology MORPHOLOGY UNREMARKABLE    RBC Morphology See Note    Smear Review Normal platelet morphology    Immature Granulocytes 1 %   Abs Immature Granulocytes 0.08 (H) 0.00 - 0.07 K/uL   Polychromasia PRESENT     Comment: Performed at [facility], [address] [place], [place] [address]  Protime-INR     Status: Abnormal   Collection Time: 02/11/23  7:57 AM  Result Value Ref Range   Prothrombin Time 27.6 (H) 11.4 - 15.2 seconds   INR 2.5 (H) 0.8 - 1.2    Comment: (NOTE) INR goal varies based on device and disease states. Performed at [facility], [address], [place], [place] [address]   APTT     Status: Abnormal   Collection Time: 02/11/23  7:57 AM  Result Value Ref Range   aPTT 39 (H) 24 - 36 seconds    Comment:        IF BASELINE aPTT IS ELEVATED, SUGGEST PATIENT RISK ASSESSMENT BE USED TO DETERMINE APPROPRIATE ANTICOAGULANT THERAPY. Performed at [facility], [address], [place], [place] [address]   I-Stat Lactic Acid, ED     Status: Abnormal   Collection Time: 02/11/23  7:58 AM  Result Value Ref Range   Lactic Acid, Venous 2.9 (HH) 0.5 - 1.9 mmol/L   Comment NOTIFIED PHYSICIAN   Blood Culture (routine x 2)     Status: None (Preliminary result)   Collection Time: 02/11/23  8:00 AM   Specimen: BLOOD  Result Value Ref Range   Specimen Description BLOOD RIGHT ANTECUBITAL    Special Requests      BOTTLES DRAWN AEROBIC AND ANAEROBIC Blood Culture adequate volume   Culture      NO GROWTH <12 HOURS Performed at [facility], [address], [place], [place] [address]    Report Status PENDING   Blood Culture (routine x 2)     Status: None (Preliminary result)   Collection Time: 02/11/23  8:00 AM   Specimen: BLOOD  LEFT HAND  Result Value Ref Range   Specimen Description BLOOD LEFT HAND    Special Requests      BOTTLES DRAWN AEROBIC AND ANAEROBIC Blood Culture adequate volume   Culture      NO GROWTH <12 HOURS Performed at [facility], [address] [place], [place] [address]    Report Status PENDING   SARS Coronavirus 2 by RT PCR (hospital order, performed in [facility] hospital lab) *cepheid single result test* Anterior Nasal Swab     Status: None   Collection Time: 02/11/23  8:24 AM   Specimen: Anterior Nasal Swab  Result Value Ref Range   SARS Coronavirus 2 by RT PCR NEGATIVE NEGATIVE    Comment: Performed at [facility], [address], [place], [place] [address]  Potassium     Status: Abnormal   Collection Time: 02/11/23  9:10 AM  Result Value Ref Range   Potassium 6.1 (H) 3.5 - 5.1 mmol/L    Comment: Performed at [facility], [address], [place], [place]  [address]  I-Stat Lactic Acid, ED     Status: Abnormal   Collection Time: 02/11/23  9:17 AM  Result Value Ref Range   Lactic Acid, Venous 2.9 (HH) 0.5 - 1.9 mmol/L   Comment NOTIFIED PHYSICIAN   MRSA Next Gen by PCR, Nasal     Status: None   Collection Time: 02/11/23  1:37 PM   Specimen: Anterior Nasal Swab  Result Value Ref Range   MRSA by PCR Next Gen NOT DETECTED NOT DETECTED    Comment: (NOTE) The GeneXpert MRSA Assay (FDA approved for NASAL specimens only), is one component of a comprehensive MRSA colonization surveillance program. It is not intended to diagnose MRSA infection nor to guide or monitor treatment for MRSA infections. Test performance is not FDA approved in patients less than 31 years old. Performed at [facility], [address], [place], [place] [address]   TSH     Status: Abnormal   Collection Time: 02/11/23  1:55 PM  Result Value Ref Range   TSH 6.904 (H) 0.350 - 4.500 uIU/mL    Comment: Performed by a 3rd Generation assay with a functional sensitivity of <=0.01  uIU/mL. Performed at [facility], [address], [place], [place] [address]   Potassium     Status: Abnormal   Collection Time: 02/11/23  1:55 PM  Result Value Ref Range   Potassium 6.1 (H) 3.5 - 5.1 mmol/L    Comment: Performed at [facility], [address], [place], [place] [address]  Glucose, capillary     Status: None   Collection Time: 02/11/23  2:53 PM  Result Value Ref Range   Glucose-Capillary 89 70 - 99 mg/dL    Comment: Glucose reference range applies only to samples taken after fasting for at least 8 hours.  Urinalysis, w/ Reflex to Culture (Infection Suspected) -Urine, Clean Catch     Status: Abnormal   Collection Time: 02/11/23  3:50 PM  Result Value Ref Range   Specimen Source URINE, CLEAN CATCH    Color, Urine AMBER (A) YELLOW    Comment: BIOCHEMICALS MAY BE AFFECTED BY COLOR   APPearance HAZY (A) CLEAR   Specific Gravity, Urine 1.017 1.005 - 1.030   pH 5.0 5.0 - 8.0   Glucose, UA NEGATIVE NEGATIVE mg/dL   Hgb urine dipstick NEGATIVE NEGATIVE   Bilirubin Urine NEGATIVE NEGATIVE   Ketones, ur NEGATIVE NEGATIVE mg/dL   Protein, ur 295 (A) NEGATIVE mg/dL   Nitrite NEGATIVE NEGATIVE   Leukocytes,Ua NEGATIVE NEGATIVE   RBC / HPF 0-5 0 - 5 RBC/hpf   WBC, UA 0-5 0 - 5 WBC/hpf    Comment:        Reflex urine culture not performed if WBC <=10, OR if Squamous epithelial cells >5. If Squamous epithelial cells >5 suggest recollection.    Bacteria, UA NONE SEEN NONE SEEN   Squamous Epithelial / HPF 0-5 0 - 5 /HPF   Mucus PRESENT    Hyaline Casts, UA PRESENT     Comment: Performed at [facility] Lab, [address], [place], [place] [address]  Glucose, capillary     Status: Abnormal   Collection Time: 02/11/23  3:53 PM  Result Value Ref Range   Glucose-Capillary 152 (H) 70 - 99 mg/dL    Comment: Glucose reference range applies only to samples taken after fasting for at least 8 hours.    US Abdomen Limited RUQ (LIVER/GB)  Result Date:  02/11/2023 CLINICAL DATA:  Elevated liver enzymes EXAM: ULTRASOUND ABDOMEN LIMITED RIGHT UPPER QUADRANT COMPARISON:  CT 02/11/2023 noncontrast FINDINGS: Gallbladder: Gallbladder is mildly distended. There is sludge and stones. Gallbladder wall is thickened at 3.5 mm. Mild adjacent ascites. No reported sonographic Murphy's sign Common bile duct: Diameter: 2 mm Liver: No focal lesion identified. Within normal limits in parenchymal echogenicity. Portal vein is patent on color Doppler imaging with normal direction of blood flow towards the liver. Other: None. IMPRESSION: Gallbladder sludge and stones. Mild wall thickening but there is some mild adjacent ascites. No ductal dilatation. If there is further concern of acute cholecystitis, HIDA scan could be considered as clinically appropriate Electronically Signed   By: Karen Kays M.D.   On: 02/11/2023 17:25   MR ANKLE LEFT WO CONTRAST  Addendum Date: 02/11/2023   ADDENDUM REPORT: 02/11/2023 17:04 ADDENDUM: The original report was by Dr. Gaylyn Rong. The following addendum is by Dr. Gaylyn Rong: This addendum is to correct a typo in the voice recognition transcribed report. The final sentence under the "other" section in the body of the report should read: NO abscess identified. Electronically Signed   By: Gaylyn Rong M.D.   On: 02/11/2023 17:04   Result Date: 02/11/2023 CLINICAL DATA:  Ankle pain, query septic arthritis/osteomyelitis EXAM: MRI OF THE LEFT ANKLE WITHOUT CONTRAST TECHNIQUE: Multiplanar, multisequence MR imaging of the ankle was performed. No intravenous contrast was administered. COMPARISON:  Radiographs 02/11/2023 FINDINGS: Despite efforts by the technologist and patient, motion artifact is present on today's exam and could not be eliminated. This reduces exam sensitivity and specificity. TENDONS Peroneal: Peroneus brevis tendinopathy posterior to the lateral malleolus. Posteromedial: Torn flexor digitorum longus tendon, only  well appreciated distal to the knot of Henry. Anterior: Expanded tibialis anterior tendon compatible tendinopathy. Trace metal artifact along the distal tendon, query prior surgical reattachment. Achilles: Fusiform Achilles contour with accentuated distal signal compatible with moderate distal Achilles tendinopathy. Plantar Fascia: Mild thickening of the medial band of the plantar fascia, cannot exclude mild plantar fasciitis. Plantar calcaneal spur noted. LIGAMENTS Lateral: Attenuated appearance of the anterior talofibular ligament suggesting a remote injury. Medial: Grossly unremarkable CARTILAGE Ankle Joint: Moderate tibiotalar joint effusion. Mild endosteal edema along the tibiotalar joint Subtalar Joints/Sinus Tarsi: Unremarkable Bones: Degenerative findings at the Lisfranc joint. Other: Subcutaneous edema along the anterior ankle and along the plantar and dorsal foot. Abscess identified. IMPRESSION: 1. Moderate tibiotalar joint effusion with mild endosteal edema along the tibiotalar joint. This is nonspecific and could be degenerative or inflammatory, but septic arthritis is not difficult to totally exclude. There less synovitis than I would typically expect in septic arthritis. Correlate clinically in determining whether arthrocentesis is indicated. 2. Torn flexor digitorum longus tendon, only well appreciated distal to the knot of Henry. 3. Expanded tibialis anterior tendon compatible with tendinopathy. Trace metal artifact along the distal tendon, query prior surgical reattachment. 4. Moderate distal Achilles tendinopathy. 5. Mild thickening of the medial band of the plantar fascia, cannot exclude mild plantar fasciitis. 6. Peroneus brevis tendinopathy posterior to the lateral malleolus. 7. Attenuated appearance of the anterior talofibular ligament suggesting a remote injury. 8. Subcutaneous edema along the anterior ankle and along the plantar and dorsal foot. No abscess identified. Electronically Signed:  By: Gaylyn Rong M.D. On: 02/11/2023 15:34   CT CHEST ABDOMEN PELVIS WO CONTRAST  Result Date: 02/11/2023 CLINICAL DATA:  Sepsis. EXAM: CT CHEST, ABDOMEN AND PELVIS WITHOUT CONTRAST TECHNIQUE: Multidetector CT imaging of the chest, abdomen and pelvis was performed following the standard  protocol without IV contrast. RADIATION DOSE REDUCTION: This exam was performed according to the departmental dose-optimization program which includes automated exposure control, adjustment of the mA and/or kV according to patient size and/or use of iterative reconstruction technique. COMPARISON:  Chest CT on 11/28/2022, and AP CT on 09/20/2022 FINDINGS: CT CHEST FINDINGS Cardiovascular: Stable appearance of aortic valve replacement, tube graft repair of ascending aorta, and endovascular stent graft of the aortic arch and descending thoracic aorta. Stable moderate cardiomegaly. Mediastinum/Lymph Nodes: No masses or pathologically enlarged lymph nodes identified on this unenhanced exam. Lungs/Pleura: Stable small to moderate bilateral pleural effusions and bilateral lower lobe atelectasis. Resolution of previously seen airspace disease in right upper and middle lobes since prior study. No new or increased areas of pulmonary opacity are seen. No No evidence of central endobronchial obstruction. Stable 4 mm pulmonary nodule in anterior right upper lobe on image 58/5. Musculoskeletal: No suspicious bone lesions identified. Multiple old right posterior rib fracture deformities are noted. 1 CT ABDOMEN AND PELVIS FINDINGS Hepatobiliary: No masses visualized on this unenhanced exam. Cholelithiasis. No radiographic evidence of cholecystitis. Pancreas: No mass or inflammatory changes identified on this unenhanced exam. Spleen:  Within normal limits in size. Adrenals/Urinary Tract: No evidence of renal or ureteral calculi, or hydronephrosis. Minimal radiodensity is seen along the left posterior bladder wall, suspicious for tiny  bladder calculi. Stomach/Bowel: No evidence of obstruction, inflammatory process, or abnormal fluid collections. A large left inguinal hernia is seen which contains sigmoid colon. This is unchanged, and there is no evidence of bowel obstruction or strangulation. Diverticulosis is seen mainly involving the descending and sigmoid colon, however there is no evidence of diverticulitis. Normal appendix visualized. Vascular/Lymphatic: No pathologically enlarged lymph nodes identified. No abdominal aortic aneurysm. Reproductive:  Mildly enlarged prostate. Other:  None. Musculoskeletal: No suspicious bone lesions identified. Mild superior endplate compression fracture of the L1 vertebral body is seen which is new since prior exam. IMPRESSION: Stable small to moderate bilateral pleural effusions and bilateral lower lobe atelectasis. Resolution of previously seen airspace disease in right upper and middle lobes. No new or increased areas of pulmonary opacity. Stable 4 mm right upper lobe pulmonary nodule. No follow-up needed if patient is low-risk. Non-contrast chest CT can be considered in 12 months if patient is high-risk. This recommendation follows the consensus statement: Guidelines for Management of Incidental Pulmonary Nodules Detected on CT Images: From the Fleischner Society 2017; Radiology 2017; 284:228-243. Cholelithiasis. No radiographic evidence of cholecystitis. Large left inguinal hernia containing sigmoid colon. No evidence of bowel obstruction or strangulation. Colonic diverticulosis, without radiographic evidence of diverticulitis. Mildly enlarged prostate. Probable tiny bladder calculi. No evidence of ureteral calculi or hydronephrosis. Mild L1 vertebral body compression fracture, new since prior study. Electronically Signed   By: Danae Orleans M.D.   On: 02/11/2023 15:15   DG Ankle Complete Left  Result Date: 02/11/2023 CLINICAL DATA:  Wound infection. EXAM: LEFT ANKLE COMPLETE - 3+ VIEW COMPARISON:   Left foot radiographs 11/20/2022 at [facility] FINDINGS: Soft tissue swelling is slightly more prominent medial than lateral. No acute or focal osseous abnormalities are present. A moderate-sized joint effusion is present. Mild osteopenia is noted. Plantar calcaneal spur is again seen. IMPRESSION: 1. Soft tissue swelling and moderate-sized joint effusion without acute or focal osseous abnormality. 2. Plantar calcaneal spur. 3. Mild osteopenia. Electronically Signed   By: Marin Roberts M.D.   On: 02/11/2023 08:49   DG Chest Port 1 View  Result Date: 02/11/2023 CLINICAL DATA:  Left  ankle wound infection.  Question sepsis. EXAM: PORTABLE CHEST 1 VIEW COMPARISON:  Two-view chest x-ray 12/22/2022 FINDINGS: Heart is enlarged. Aortic stent grafting noted. Left greater than right pleural effusions are again seen. Mild pulmonary vascular congestion is noted. Bibasilar airspace opacities are present, left greater than right. Atherosclerotic calcifications are noted within the distal subclavian and axillary arteries. Degenerative changes are present in the shoulders, right greater than left. IMPRESSION: 1. Cardiomegaly and mild pulmonary vascular congestion without frank edema. 2. Left greater than right pleural effusions and bibasilar airspace disease, left greater than right. While this may represent atelectasis, infection is not excluded. Electronically Signed   By: Marin Roberts M.D.   On: 02/11/2023 08:34    Intake/Output      08/24 0701 08/25 0700 08/25 0701 08/26 0700   I.V. (mL/kg)  7.9 (0.1)   IV Piggyback  1406.2   Total Intake(mL/kg)  1414.1 (19.4)   Net  +1414.1           Review of Systems  Unable to perform ROS: Mental status change   Blood pressure (!) 96/51, pulse 76, temperature 97.8 F (36.6 C), temperature source Oral, resp. rate 18, height 5\' 10"  (1.778 m), weight 72.9 kg, SpO2 91%. Physical Exam Vitals and nursing note reviewed.  Constitutional:      Comments:  Frail-appearing white male  Musculoskeletal:     Comments: Left lower extremity Soft Kerlix dressing to the lower leg ankle and foot All dressings removed including Xeroform. Foot and ankle have wounds of various stages of healing and all appear to be relatively stable Patient has pain with gentle manipulation of his left ankle No significant erythema or increased temperature to the left ankle noted Faint DP pulse is noted on palpation No substantial swelling to the lower leg or ankle noted Generalized sarcopenia Patient not really following commands with respect to motor or sensory evaluation  Right lower extremity Dressing was not removed completely however I did visualize his second toe which clearly has exposed bone from his phalanx dorsally.  No active drainage or odor appreciated      Assessment/Plan:  82 year old male with complex medical history admitted for altered mental status with concerns for bacteremia, left ankle pain and effusion, bilateral lower extremity ulcerations  -Left ankle effusion----> septic arthritis L ankle  Verbal consent obtained from wife to proceed with bedside ankle arthrocentesis   Using aseptic technique joint line identified medially between the medial malleolus and tibialis anterior.  This area was marked with a pen and clean with alcohol swab and Betadine x 4.  The skin was anesthetized with ethyl chloride spray and the area was infiltrated with a 21-gauge needle on a 10 cc syringe.  Joint was entered and immediately upon entrance purulent fluid was withdrawn.  Total of about 3 to 4 cc of purulent fluid was obtained.  Needle was withdrawn and pressure applied to the aspiration site.  A 2 x 2 Mepilex was applied to the area and his Prevalon boots were placed on.  I did ask the nurse to reapply Xeroform and Kerlix when able to do so   Specimen will be sent for cell count with differential as well as aerobic anaerobic cultures and Gram stain   I have  communicated with the infectious disease service as well as the medical service   Even though patient is in a relatively frail state I think that formal joint irrigation debridement is warranted particularly in the setting of his poor arterial flow.  If anesthesia feels there is too much risk we could consider intra-articular antibiotics   Will have anesthesia do a consult preoperatively to evaluate and discuss anesthetic options with patient and family.      - Dispo:  Continue with inpatient care  Possible OR tomorrow for formal I&D of left ankle joint  Npo after mn     Mearl Latin, PA-C [phone] (C) 02/11/2023, 5:42 PM  [facility] [address] [place] [address] [phone] [phone] (F)    After 5pm and on the weekends please log on to Amion, go to orthopaedics and the look under the [facility] Call for the provider(s) on call. You can also call our office at [phone] and then follow the prompts to be connected to the call team.

## 2023-02-11 NOTE — Plan of Care (Signed)

## 2023-02-11 NOTE — ED Triage Notes (Signed)
Pt arrives EMS from home with reports of ongoing emesis, nausea and decreased appetite for the last couple weeks. Pt is being treated with abx for wound infection on left foot. Pt reports feeling fatigued as well.

## 2023-02-11 NOTE — ED Notes (Signed)
RN informed pt once medications are verified medications will be administered

## 2023-02-11 NOTE — ED Notes (Signed)
Provided pt ice chips. Family at bedside assisting with feeding to him.

## 2023-02-11 NOTE — ED Notes (Signed)
RN updated pt family on his care. Family has no additional questions at this time.

## 2023-02-12 ENCOUNTER — Inpatient Hospital Stay (HOSPITAL_COMMUNITY): Payer: Medicare Other

## 2023-02-12 ENCOUNTER — Other Ambulatory Visit: Payer: Self-pay

## 2023-02-12 ENCOUNTER — Inpatient Hospital Stay (HOSPITAL_COMMUNITY): Payer: Medicare Other | Admitting: Anesthesiology

## 2023-02-12 ENCOUNTER — Encounter (HOSPITAL_COMMUNITY): Payer: Self-pay | Admitting: Internal Medicine

## 2023-02-12 ENCOUNTER — Encounter (HOSPITAL_COMMUNITY)
Admission: EM | Disposition: A | Discharge status: Hospice | Payer: Self-pay | Source: Home / Self Care | Attending: Internal Medicine

## 2023-02-12 DIAGNOSIS — I110 Hypertensive heart disease with heart failure: Secondary | ICD-10-CM | POA: Diagnosis not present

## 2023-02-12 DIAGNOSIS — M009 Pyogenic arthritis, unspecified: Secondary | ICD-10-CM | POA: Diagnosis not present

## 2023-02-12 DIAGNOSIS — R627 Adult failure to thrive: Secondary | ICD-10-CM | POA: Diagnosis not present

## 2023-02-12 DIAGNOSIS — I5042 Chronic combined systolic (congestive) and diastolic (congestive) heart failure: Secondary | ICD-10-CM | POA: Diagnosis not present

## 2023-02-12 DIAGNOSIS — N179 Acute kidney failure, unspecified: Secondary | ICD-10-CM

## 2023-02-12 DIAGNOSIS — R112 Nausea with vomiting, unspecified: Secondary | ICD-10-CM | POA: Diagnosis not present

## 2023-02-12 DIAGNOSIS — I2510 Atherosclerotic heart disease of native coronary artery without angina pectoris: Secondary | ICD-10-CM

## 2023-02-12 DIAGNOSIS — E875 Hyperkalemia: Secondary | ICD-10-CM

## 2023-02-12 DIAGNOSIS — I340 Nonrheumatic mitral (valve) insufficiency: Secondary | ICD-10-CM | POA: Diagnosis not present

## 2023-02-12 DIAGNOSIS — R5383 Other fatigue: Secondary | ICD-10-CM | POA: Diagnosis not present

## 2023-02-12 DIAGNOSIS — R4182 Altered mental status, unspecified: Secondary | ICD-10-CM

## 2023-02-12 HISTORY — PX: I & D EXTREMITY: SHX5045

## 2023-02-12 LAB — VITAMIN B12: Vitamin B-12: 909 pg/mL (ref 180–914)

## 2023-02-12 LAB — CBC
HCT: 26.6 % — ABNORMAL LOW (ref 39.0–52.0)
Hemoglobin: 7.9 g/dL — ABNORMAL LOW (ref 13.0–17.0)
MCH: 31.6 pg (ref 26.0–34.0)
MCHC: 29.7 g/dL — ABNORMAL LOW (ref 30.0–36.0)
MCV: 106.4 fL — ABNORMAL HIGH (ref 80.0–100.0)
Platelets: 197 10*3/uL (ref 150–400)
RBC: 2.5 MIL/uL — ABNORMAL LOW (ref 4.22–5.81)
RDW: 23.9 % — ABNORMAL HIGH (ref 11.5–15.5)
WBC: 8.5 10*3/uL (ref 4.0–10.5)
nRBC: 0.4 % — ABNORMAL HIGH (ref 0.0–0.2)

## 2023-02-12 LAB — COMPREHENSIVE METABOLIC PANEL
ALT: 169 U/L — ABNORMAL HIGH (ref 0–44)
AST: 243 U/L — ABNORMAL HIGH (ref 15–41)
Albumin: 2.4 g/dL — ABNORMAL LOW (ref 3.5–5.0)
Alkaline Phosphatase: 164 U/L — ABNORMAL HIGH (ref 38–126)
Anion gap: 17 — ABNORMAL HIGH (ref 5–15)
BUN: 62 mg/dL — ABNORMAL HIGH (ref 8–23)
CO2: 15 mmol/L — ABNORMAL LOW (ref 22–32)
Calcium: 8.6 mg/dL — ABNORMAL LOW (ref 8.9–10.3)
Chloride: 111 mmol/L (ref 98–111)
Creatinine, Ser: 2.19 mg/dL — ABNORMAL HIGH (ref 0.61–1.24)
GFR, Estimated: 29 mL/min — ABNORMAL LOW (ref >60)
Glucose, Bld: 121 mg/dL — ABNORMAL HIGH (ref 70–99)
Potassium: 5.4 mmol/L — ABNORMAL HIGH (ref 3.5–5.1)
Sodium: 143 mmol/L (ref 135–145)
Total Bilirubin: 0.9 mg/dL (ref 0.3–1.2)
Total Protein: 5.7 g/dL — ABNORMAL LOW (ref 6.5–8.1)

## 2023-02-12 LAB — GLUCOSE, CAPILLARY
Glucose-Capillary: 104 mg/dL — ABNORMAL HIGH (ref 70–99)
Glucose-Capillary: 96 mg/dL (ref 70–99)
Glucose-Capillary: 95 mg/dL (ref 70–99)
Glucose-Capillary: 92 mg/dL (ref 70–99)
Glucose-Capillary: 104 mg/dL — ABNORMAL HIGH (ref 70–99)

## 2023-02-12 LAB — ECHOCARDIOGRAM LIMITED
Area-P 1/2: 8.92 cm2
Height: 70 in
MV M vel: 4.54 m/s
MV Peak grad: 82.4 mmHg
Radius: 0.5 cm
S' Lateral: 4.9 cm
Single Plane A4C EF: 31.4 %
Weight: 2571.45 [oz_av]

## 2023-02-12 LAB — PREPARE RBC (CROSSMATCH)

## 2023-02-12 LAB — FOLATE: Folate: 35.4 ng/mL (ref >5.9)

## 2023-02-12 SURGERY — IRRIGATION AND DEBRIDEMENT EXTREMITY
Anesthesia: Monitor Anesthesia Care | Site: Ankle | Laterality: Left

## 2023-02-12 MED ORDER — CHLORHEXIDINE GLUCONATE 0.12 % MT SOLN
15.0000 mL | Freq: Once | OROMUCOSAL | Status: AC
  Administered 2023-02-12: 15 mL via OROMUCOSAL

## 2023-02-12 MED ORDER — PROPOFOL 10 MG/ML IV BOLUS
INTRAVENOUS | Status: AC
  Filled 2023-02-12: qty 20

## 2023-02-12 MED ORDER — BUPIVACAINE HCL (PF) 0.25 % IJ SOLN
INTRAMUSCULAR | Status: AC
  Filled 2023-02-12: qty 30

## 2023-02-12 MED ORDER — SODIUM CHLORIDE 0.9 % IV SOLN: INTRAVENOUS | Status: DC

## 2023-02-12 MED ORDER — ORAL CARE MOUTH RINSE: 15.0000 mL | Freq: Once | OROMUCOSAL | Status: AC

## 2023-02-12 MED ORDER — MIDAZOLAM HCL 2 MG/2ML IJ SOLN
INTRAMUSCULAR | Status: AC
  Filled 2023-02-12: qty 2

## 2023-02-12 MED ORDER — FENTANYL CITRATE (PF) 250 MCG/5ML IJ SOLN
INTRAMUSCULAR | Status: AC
  Filled 2023-02-12: qty 5

## 2023-02-12 MED ORDER — SODIUM CHLORIDE 0.9 % IR SOLN
Status: DC | PRN
  Administered 2023-02-12: 3000 mL

## 2023-02-12 MED ORDER — LACTATED RINGERS IV SOLN: INTRAVENOUS | Status: AC

## 2023-02-12 MED ORDER — CHLORHEXIDINE GLUCONATE 0.12 % MT SOLN
OROMUCOSAL | Status: AC
  Filled 2023-02-12: qty 15

## 2023-02-12 MED ORDER — BUPIVACAINE HCL (PF) 0.5 % IJ SOLN
INTRAMUSCULAR | Status: AC
  Filled 2023-02-12: qty 30

## 2023-02-12 MED ORDER — 0.9 % SODIUM CHLORIDE (POUR BTL) OPTIME
TOPICAL | Status: DC | PRN
  Administered 2023-02-12: 1000 mL

## 2023-02-12 MED ORDER — BOOST / RESOURCE BREEZE PO LIQD CUSTOM
1.0000 | Freq: Three times a day (TID) | ORAL | Status: DC
  Administered 2023-02-12 – 2023-02-13 (×4): 1 via ORAL

## 2023-02-12 MED ORDER — LACTATED RINGERS IV BOLUS
500.0000 mL | Freq: Once | INTRAVENOUS | Status: AC
  Administered 2023-02-12: 500 mL via INTRAVENOUS

## 2023-02-12 MED ORDER — BUPIVACAINE HCL (PF) 0.5 % IJ SOLN
INTRAMUSCULAR | Status: DC | PRN
  Administered 2023-02-12: 20 mL

## 2023-02-12 SURGICAL SUPPLY — 56 items
APL PRP STRL LF DISP 70% ISPRP (MISCELLANEOUS)
BAG COUNTER SPONGE SURGICOUNT (BAG) ×1 IMPLANT
BAG SPNG CNTER NS LX DISP (BAG) ×1
BNDG CMPR STD VLCR NS LF 5.8X4 (GAUZE/BANDAGES/DRESSINGS) ×1
BNDG COHESIVE 4X5 TAN STRL (GAUZE/BANDAGES/DRESSINGS) ×1 IMPLANT
BNDG ELASTIC 4X5.8 VLCR NS LF (GAUZE/BANDAGES/DRESSINGS) IMPLANT
BNDG GAUZE DERMACEA FLUFF 4 (GAUZE/BANDAGES/DRESSINGS) ×2 IMPLANT
BNDG GZE DERMACEA 4 6PLY (GAUZE/BANDAGES/DRESSINGS)
BRUSH SCRUB EZ PLAIN DRY (MISCELLANEOUS) ×2 IMPLANT
CHLORAPREP W/TINT 26 (MISCELLANEOUS) ×1 IMPLANT
COVER MAYO STAND STRL (DRAPES) ×1 IMPLANT
COVER SURGICAL LIGHT HANDLE (MISCELLANEOUS) ×2 IMPLANT
DRAPE ORTHO SPLIT 77X108 STRL (DRAPES) ×1
DRAPE SURG 17X23 STRL (DRAPES) ×1 IMPLANT
DRAPE SURG ORHT 6 SPLT 77X108 (DRAPES) ×1 IMPLANT
DRAPE U-SHAPE 47X51 STRL (DRAPES) ×1 IMPLANT
DRSG ADAPTIC 3X8 NADH LF (GAUZE/BANDAGES/DRESSINGS) ×1 IMPLANT
DRSG MEPITEL 4X7.2 (GAUZE/BANDAGES/DRESSINGS) IMPLANT
ELECT REM PT RETURN 9FT ADLT (ELECTROSURGICAL) ×1
ELECTRODE REM PT RTRN 9FT ADLT (ELECTROSURGICAL) IMPLANT
EVACUATOR 1/8 PVC DRAIN (DRAIN) IMPLANT
GAUZE SPONGE 4X4 12PLY STRL (GAUZE/BANDAGES/DRESSINGS) ×1 IMPLANT
GLOVE BIO SURGEON STRL SZ 6.5 (GLOVE) ×3 IMPLANT
GLOVE BIO SURGEON STRL SZ7.5 (GLOVE) ×4 IMPLANT
GLOVE BIOGEL PI IND STRL 6.5 (GLOVE) ×1 IMPLANT
GLOVE BIOGEL PI IND STRL 7.5 (GLOVE) ×1 IMPLANT
GOWN STRL REUS W/ TWL LRG LVL3 (GOWN DISPOSABLE) ×2 IMPLANT
GOWN STRL REUS W/TWL LRG LVL3 (GOWN DISPOSABLE) ×2
HANDPIECE INTERPULSE COAX TIP (DISPOSABLE)
KIT BASIN OR (CUSTOM PROCEDURE TRAY) ×1 IMPLANT
KIT TURNOVER KIT B (KITS) ×1 IMPLANT
MANIFOLD NEPTUNE II (INSTRUMENTS) ×1 IMPLANT
NDL HYPO 22X1.5 SAFETY MO (MISCELLANEOUS) IMPLANT
NEEDLE HYPO 22X1.5 SAFETY MO (MISCELLANEOUS) ×1 IMPLANT
NS IRRIG 1000ML POUR BTL (IV SOLUTION) ×1 IMPLANT
PACK ORTHO EXTREMITY (CUSTOM PROCEDURE TRAY) ×1 IMPLANT
PAD ABD 8X10 STRL (GAUZE/BANDAGES/DRESSINGS) IMPLANT
PAD ARMBOARD 7.5X6 YLW CONV (MISCELLANEOUS) ×2 IMPLANT
PAD CAST 4YDX4 CTTN HI CHSV (CAST SUPPLIES) IMPLANT
PADDING CAST COTTON 4X4 STRL (CAST SUPPLIES) ×1
PADDING CAST COTTON 6X4 STRL (CAST SUPPLIES) ×1 IMPLANT
SET CYSTO W/LG BORE CLAMP LF (SET/KITS/TRAYS/PACK) IMPLANT
SET HNDPC FAN SPRY TIP SCT (DISPOSABLE) IMPLANT
SPONGE T-LAP 18X18 ~~LOC~~+RFID (SPONGE) ×1 IMPLANT
SUT ETHILON 2 0 FS 18 (SUTURE) ×2 IMPLANT
SUT ETHILON 3 0 PS 1 (SUTURE) ×2 IMPLANT
SUT MON AB 2-0 CT1 36 (SUTURE) ×1 IMPLANT
SUT PDS AB 0 CT 36 (SUTURE) IMPLANT
SWAB CULTURE ESWAB REG 1ML (MISCELLANEOUS) IMPLANT
SYR CONTROL 10ML LL (SYRINGE) IMPLANT
TOWEL GREEN STERILE (TOWEL DISPOSABLE) ×2 IMPLANT
TOWEL GREEN STERILE FF (TOWEL DISPOSABLE) ×1 IMPLANT
TUBE CONNECTING 12X1/4 (SUCTIONS) ×1 IMPLANT
UNDERPAD 30X36 HEAVY ABSORB (UNDERPADS AND DIAPERS) ×1 IMPLANT
WATER STERILE IRR 1000ML POUR (IV SOLUTION) ×1 IMPLANT
YANKAUER SUCT BULB TIP NO VENT (SUCTIONS) ×1 IMPLANT

## 2023-02-12 NOTE — Progress Notes (Signed)
  Echocardiogram 2D Echocardiogram has been performed.  [name] [name] 02/12/2023, 11:53 AM

## 2023-02-12 NOTE — Interval H&P Note (Signed)
History and Physical Interval Note:  02/12/2023 7:33 AM  [name] [name]  has presented today for surgery, with the diagnosis of SEPTIC JOINT LEFT ANKLE.  The various methods of treatment have been discussed with the patient and family. After consideration of risks, benefits and other options for treatment, the patient has consented to  Procedure(s): IRRIGATION AND DEBRIDEMENT, ANKLE (Left) as a surgical intervention.  The patient's history has been reviewed, patient examined, no change in status, stable for surgery.  I have reviewed the patient's chart and labs.  Questions were answered to the patient's satisfaction.     Caryn Bee P Cosmo Tetreault

## 2023-02-12 NOTE — Evaluation (Signed)
Clinical/Bedside Swallow Evaluation Patient Details  Name: [name] [name] MRN: [id number] Date of Birth: 1940-12-20  Today's Date: 02/12/2023 Time: SLP Start Time (ACUTE ONLY): 1445 SLP Stop Time (ACUTE ONLY): 1511 SLP Time Calculation (min) (ACUTE ONLY): 26 min  Past Medical History:  Past Medical History:  Diagnosis Date   Acute on chronic diastolic heart failure (HCC) 08/22/2013   Acute on chronic respiratory failure with hypoxia (HCC) 10/03/2019   Acute on chronic systolic heart failure (HCC) 05/11/2013   EF from 35% to 50-55% on 6/14 ECHO.    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 No evidence of dissection or endoleak.  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 No evidence of abdominal aortic dissection.  Occluded proximal left subclav   Aortic valve insufficiency    Arthritis    "probably in my knees before they replaced them" (01/28/2015)   Atrial fibrillation (HCC) 05/21/2013   Atrial flutter (HCC)    Bradycardia 08/23/2013   CAD (coronary artery disease)    Chest pain 01/28/2015   CHF (congestive heart failure) (HCC)    Chronic diastolic heart failure (HCC) 01/28/2015   Descending aortic aneurysm (HCC)    DVT (deep venous thrombosis) (HCC)    ?LLE post knee surgery   Dyspnea 09/2019   HCAP (healthcare-associated pneumonia) 10/11/2013   History of blood transfusion    "after one of my knee surgeries"   HTN (hypertension)    Hyperlipidemia    Hypotension 12/17/2019   Incarcerated left inguinal hernia s/p repair 12/02/2019 12/01/2019   Insomnia 09/04/2013   Multiple fractures of ribs, right side, init  for clos fx 09/04/2019   Near syncope 01/28/2015   Pressure injury of skin 10/06/2019   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 12/18/2019 12/17/2019   Recurrent UTI 10/06/2013   Shock circulatory (HCC) 10/07/2013   Thoracic aneurysm    Tobacco abuse    Urinary retention 10/06/2013   UTI (urinary tract infection) 10/06/2013   Past Surgical History:  Past Surgical History:  Procedure Laterality Date   AORTIC VALVE REPLACEMENT  02/19/2009   AORTO-FEMORAL BYPASS GRAFT  03/2010   ascending aortic and arch aneurysm repair/notes 04/09/2009   CARDIAC VALVE REPLACEMENT     CAROTID-SUBCLAVIAN BYPASS GRAFT Left 08/26/2013   Procedure: BYPASS GRAFT CAROTID-SUBCLAVIAN;  Surgeon: Nada Libman, MD;  Location: [facility] OR;  Service: Vascular;  Laterality: Left;   CATARACT EXTRACTION W/ INTRAOCULAR LENS  IMPLANT, BILATERAL Bilateral    ENDOVASCULAR STENT INSERTION N/A 08/26/2013   Procedure:  THORACIC STENT GRAFT INSERTION;  Surgeon: Nada Libman, MD;  Location: [facility] OR;  Service: Vascular;  Laterality: N/A;   EYE SURGERY     INGUINAL HERNIA REPAIR Left 12/02/2019   Procedure: REPAIR LEFT INGUINAL HERNIA WITH MESH;  Surgeon: Violeta Gelinas, MD;  Location: [facility] OR;  Service: General;  Laterality: Left;   INGUINAL HERNIA REPAIR Left 12/18/2019   Procedure: LAPAROSCOPIC ASSISTED REPAIR OF RECURRENT INCARCERATED LEFT INGUINAL HERNIA WITH MESH; LAPAOSCOPIC REPAIR OF SEROSAL TEAR;  Surgeon: Gaynelle Adu, MD;  Location: [facility] OR;  Service: General;  Laterality: Left;   INSERTION OF MESH Left 12/02/2019  Procedure: INSERTION OF MESH;  Surgeon: Violeta Gelinas, MD;  Location: [facility] OR;  Service: General;  Laterality: Left;   JOINT REPLACEMENT     KNEE ARTHROSCOPY Bilateral    REPLACEMENT TOTAL KNEE Bilateral    right groin lymphocele Right 03/29/2009   RIGHT/LEFT HEART CATH AND CORONARY ANGIOGRAPHY N/A 10/06/2019   Procedure: RIGHT/LEFT HEART CATH AND CORONARY ANGIOGRAPHY;  Surgeon: Lyn Records, MD;  Location: [facility] INVASIVE CV LAB;  Service: Cardiovascular;  Laterality: N/A;   THORACENTESIS  2010 X 2   TONSILLECTOMY     HPI:  Pt is an 82 yo male presenting to ED 8/25 with ~1 week worsening PO intake and 1 day history of n/v. Found to have septic arthritis of L anke, s/p I&D 8/26. Recently admitted 6/11-6/21 for MRSA PNA complicated by MRSA bacterimia. Seen by SLP June 2024 with no overt s/s of dysphagia or aspiration and was recommended a regular texture diet with thin liquids at that time. Per RN, during this admission, pt is gagging and coughing prior to consuming POs. PMH includes A-fib with RVR on Eliquis, chronic CHF on Lasix, AAA s/p repair, aortic valve replacement, moderate mitral insufficiency, HTN, IDA, peripheral vascular disease with absent pulses, ischemic changes, chronic wounds on BLE (chronically exposed bone R foot, second digit), sacral decubitus ulcer    Assessment / Plan / Recommendation  Clinical Impression  Per RN, pt has been gagging as food is presented to him and is currently primarily consuming Boost. His wife reports no difficulties with swallowing until current admission. Pt with low vocal quality and weak cough noted during attempts to expectorate secretions without success. Oral motor exam WFL. Trials of thin liquids and purees with no clinical s/s of dysphagia or aspiration. Solid textures with delayed cough and gagging before POs received into oral cavity and continued after the swallow. Given pt's presentation today and nursing reports, suspect esophageal or behavioral component. Per pt's wife, pt has no known history of reflux, although note had a hiatal hernia ~20 years ago. Pt observed with multiple attempts at expectorating secretions into oral cavity with no success, although suspect sensation of secretions could also be contributing to gagging. SLP provided education regarding hydration to more effectively mobilize secretions. Recommend continuing regular  diet with thin liquids as able. No further SLP f/u is needed at this time. Will s/o.  SLP Visit Diagnosis: Dysphagia, unspecified (R13.10)    Aspiration Risk  Mild aspiration risk    Diet Recommendation Regular;Thin liquid    Liquid Administration via: Cup;Straw Medication Administration: Whole meds with liquid (one at a time) Supervision: Staff to assist with self feeding;Full supervision/cueing for compensatory strategies Compensations: Minimize environmental distractions;Slow rate;Small sips/bites Postural Changes: Seated upright at 90 degrees;Remain upright for at least 30 minutes after po intake    Other  Recommendations Recommended Consults: Consider GI evaluation Oral Care Recommendations: Oral care BID    Recommendations for follow up therapy are one component of a multi-disciplinary discharge planning process, led by the attending physician.  Recommendations may be updated based on patient status, additional functional criteria and insurance authorization.  Follow up Recommendations No SLP follow up      Assistance Recommended at Discharge    Functional Status Assessment Patient has not had a recent decline in their functional status  Frequency and Duration            Prognosis Prognosis for improved oropharyngeal function: Good      Swallow Study   General HPI: Pt  is an 82 yo male presenting to ED 8/25 with ~1 week worsening PO intake and 1 day history of n/v. Found to have septic arthritis of L anke, s/p I&D 8/26. Recently admitted 6/11-6/21 for MRSA PNA complicated by MRSA bacterimia. Seen by SLP June 2024 with no overt s/s of dysphagia or aspiration and was recommended a regular texture diet with thin liquids at that time. Per RN, during this admission, pt is gagging and coughing prior to consuming POs. PMH includes A-fib with RVR on Eliquis, chronic CHF on Lasix, AAA s/p repair, aortic valve replacement, moderate mitral insufficiency, HTN, IDA, peripheral vascular  disease with absent pulses, ischemic changes, chronic wounds on BLE (chronically exposed bone R foot, second digit), sacral decubitus ulcer Type of Study: Bedside Swallow Evaluation Previous Swallow Assessment: see HPI Diet Prior to this Study: Regular;Thin liquids (Level 0) Temperature Spikes Noted: No Respiratory Status: Room air History of Recent Intubation: No Behavior/Cognition: Alert;Cooperative Oral Cavity Assessment: Within Functional Limits Oral Care Completed by SLP: No Oral Cavity - Dentition: Adequate natural dentition Vision: Functional for self-feeding Self-Feeding Abilities: Needs assist Patient Positioning: Upright in bed Baseline Vocal Quality: Low vocal intensity Volitional Cough: Weak Volitional Swallow: Able to elicit    Oral/Motor/Sensory Function Overall Oral Motor/Sensory Function: Within functional limits   Ice Chips Ice chips: Not tested   Thin Liquid Thin Liquid: Within functional limits Presentation: Straw    Nectar Thick Nectar Thick Liquid: Not tested   Honey Thick Honey Thick Liquid: Not tested   Puree Puree: Within functional limits Presentation: Spoon   Solid     Solid: Impaired Pharyngeal Phase Impairments: Cough - Delayed      Gwynneth Aliment, M.A., CF-SLP Speech Language Pathology, Acute Rehabilitation Services  Secure Chat preferred [phone]  02/12/2023,3:30 PM

## 2023-02-12 NOTE — Transfer of Care (Signed)
Immediate Anesthesia Transfer of Care Note  Patient: [name] [name]  Procedure(s) Performed: IRRIGATION AND DEBRIDEMENT, ANKLE (Left: Ankle)  Patient Location:  2C06  Anesthesia Type:MAC  Level of Consciousness: awake, alert , and oriented  Airway & Oxygen Therapy: Patient Spontanous Breathing  Post-op Assessment: Report given to RN and Post -op Vital signs reviewed and stable  Post vital signs: Reviewed and stable  Last Vitals:  Vitals Value Taken Time  BP 99/48 02/12/23 0834  Temp 36.7 C 02/12/23 0834  Pulse 92 02/12/23 0840  Resp 19 02/12/23 0840  SpO2 96 % 02/12/23 0840  Vitals shown include unfiled device data.  Last Pain:  Vitals:   02/12/23 0834  TempSrc: Oral  PainSc: 0-No pain         Complications: No notable events documented.

## 2023-02-12 NOTE — Evaluation (Signed)
Physical Therapy Evaluation Patient Details Name: [name] [name] MRN: [id number] DOB: March 23, 1941 Today's Date: 02/12/2023  History of Present Illness  Pt is an 82 y/o M presenting to ED on 8/25 with n/v, admitted for AMS with concerns for bacteremia. Found to have septic arthritis of L ankle, s/p I & D on 8/26. PMH: A fib on chronic anticoagulation, chronic CHF, AAA s/p repair, AVR, moderate to severe PVD/PAD, chronic bilateral foot and LE ulcers   Clinical Impression  Pt admitted with above. Spouse provided home set up and PLOF prior to leaving for lunch. Pt very lethargic with limited active participation in evaluation. Pt maxAX2 to transfer to EOB. Per chart pt is WBAT on L LE however pt unsafe to attempt to stand today due to lethargy. Pt with multiple ulcers t/o bilat feet with noted necrosis. Wife reports he has been walking on it because he states "he doesn't hurt." Pt was ambulating with RW and doing majority of ADLs with set up however currently requires maxAX2 for all mobility. Recommend inpatient rehab stay <3 hrs/day to allow for progression back to PLOF for safe transition home with wife. Acute PT to cont to follow.        If plan is discharge home, recommend the following: Two people to help with walking and/or transfers;Two people to help with bathing/dressing/bathroom;Direct supervision/assist for medications management;Direct supervision/assist for financial management;Assist for transportation;Help with stairs or ramp for entrance;Assistance with feeding   Can travel by private vehicle   No    Equipment Recommendations None recommended by PT  Recommendations for Other Services       Functional Status Assessment Patient has had a recent decline in their functional status and demonstrates the ability to make significant improvements in function in a reasonable and predictable amount of time.     Precautions / Restrictions Precautions Precautions: Fall Precaution  Comments: multiple LE and foot wounds/ulcers Restrictions Weight Bearing Restrictions: Yes Other Position/Activity Restrictions: LLE WBAT      Mobility  Bed Mobility Overal bed mobility: Needs Assistance Bed Mobility: Sidelying to Sit, Sit to Sidelying   Sidelying to sit: +2 for physical assistance, Max assist     Sit to sidelying: Max assist, +2 for physical assistance General bed mobility comments: assist for trunk elevation and bringing BLE's off of bed and back up on bed    Transfers                   General transfer comment: deferred as pt too lethargic    Ambulation/Gait               General Gait Details: unable this date  Stairs            Wheelchair Mobility     Tilt Bed    Modified Rankin (Stroke Patients Only)       Balance Overall balance assessment: Needs assistance Sitting-balance support: Bilateral upper extremity supported Sitting balance-Leahy Scale: Poor Sitting balance - Comments: posterior lean, relying on holding bed rails or minA posteriorly                                     Pertinent Vitals/Pain Pain Assessment Pain Assessment: Faces Faces Pain Scale: Hurts even more Pain Location: R heel Pain Descriptors / Indicators: Discomfort, Grimacing Pain Intervention(s): Limited activity within patient's tolerance    Home Living Family/patient expects to be discharged to:: Private residence  Living Arrangements: Spouse/significant other;Children Available Help at Discharge: Available 24 hours/day;Family Type of Home: Mobile home Home Access: Stairs to enter Entrance Stairs-Rails: Right Entrance Stairs-Number of Steps: 4   Home Layout: One level Home Equipment: Agricultural consultant (2 wheels);Shower seat;Cane - single point;Rollator (4 wheels);Grab bars - tub/shower;Toilet riser;Hand held shower head;Other (comment) (lift chair) Additional Comments: spouse stated using his rollator recently as a w/c to push  him around the house.    Prior Function Prior Level of Function : Independent/Modified Independent;History of Falls (last six months)             Mobility Comments: pivot transferred and was ambulating with RW since leaving AIR however hasn't ambulated since last friday ADLs Comments: ind, spouse reports pt was able to get to sink for oral care and perform bathing tasks, sponge bathes     Extremity/Trunk Assessment   Upper Extremity Assessment Upper Extremity Assessment: Defer to OT evaluation    Lower Extremity Assessment Lower Extremity Assessment: Generalized weakness (pt with ace wrapped dressing on L ankle and gauze dressing on R foot/ankle, able to move at all joints however not t/o the whole range)    Cervical / Trunk Assessment Cervical / Trunk Assessment: Kyphotic  Communication   Communication Communication: Hearing impairment  Cognition Arousal: Lethargic Behavior During Therapy: Flat affect Overall Cognitive Status: No family/caregiver present to determine baseline cognitive functioning                                 General Comments: pt A & O x4, however keeps eyes closed and frequently grunts/nods instead of verbalizing responses to questions asked, pt did follow simple commands with increased time        General Comments General comments (skin integrity, edema, etc.): VSS on RA however noted DOE with sitting EOB and RR up to 38 however SpO2 >94%    Exercises     Assessment/Plan    PT Assessment Patient needs continued PT services  PT Problem List Decreased strength;Decreased range of motion;Decreased activity tolerance;Decreased balance;Decreased mobility;Decreased coordination;Decreased cognition;Decreased knowledge of use of DME;Decreased safety awareness;Decreased knowledge of precautions       PT Treatment Interventions DME instruction;Gait training;Stair training;Functional mobility training;Therapeutic activities;Therapeutic  exercise;Balance training    PT Goals (Current goals can be found in the Care Plan section)  Acute Rehab PT Goals Patient Stated Goal: unable to state PT Goal Formulation: Patient unable to participate in goal setting Time For Goal Achievement: 02/26/23 Potential to Achieve Goals: Fair    Frequency Min 1X/week     Co-evaluation PT/OT/SLP Co-Evaluation/Treatment: Yes Reason for Co-Treatment: Complexity of the patient's impairments (multi-system involvement);For patient/therapist safety;To address functional/ADL transfers PT goals addressed during session: Mobility/safety with mobility OT goals addressed during session: ADL's and self-care;Strengthening/ROM       AM-PAC PT "6 Clicks" Mobility  Outcome Measure Help needed turning from your back to your side while in a flat bed without using bedrails?: Total Help needed moving from lying on your back to sitting on the side of a flat bed without using bedrails?: Total Help needed moving to and from a bed to a chair (including a wheelchair)?: Total Help needed standing up from a chair using your arms (e.g., wheelchair or bedside chair)?: Total Help needed to walk in hospital room?: Total Help needed climbing 3-5 steps with a railing? : Total 6 Click Score: 6    End of Session   Activity  Tolerance: Patient limited by lethargy Patient left: in bed;with call bell/phone within reach;with bed alarm set Nurse Communication: Mobility status PT Visit Diagnosis: Unsteadiness on feet (R26.81);Muscle weakness (generalized) (M62.81);Difficulty in walking, not elsewhere classified (R26.2)    Time: 1247-1310 PT Time Calculation (min) (ACUTE ONLY): 23 min   Charges:   PT Evaluation $PT Eval Moderate Complexity: 1 Mod   PT General Charges $$ ACUTE PT VISIT: 1 Visit         [name], PT, DPT Acute Rehabilitation Services Secure chat preferred Office #: [phone]   Iona Hansen 02/12/2023, 2:00 PM

## 2023-02-12 NOTE — Evaluation (Signed)
Occupational Therapy Evaluation Patient Details Name: [name] [name] MRN: [id number] DOB: Feb 21, 1941 Today's Date: 02/12/2023   History of Present Illness Pt is an 82 y/o M presenting to ED on 8/25 with n/v, admitted for AMS with concerns for bacteremia. Found to have septic arthritis of L ankle, s/p I & D on 8/26. 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 Pt currently lethargic, needing set up -max A for ADLs, max A +2 for bed mobility, and minA sitting EOB to prevent posterior lean. Pt keeping eyes closed frequently during session, oriented x4. Pt presenting with impairments listed below, will follow acutely. Patient will benefit from continued inpatient follow up therapy, <3 hours/day to maximize safety/ind with ADLs/funcitonal mobility.       If plan is discharge home, recommend the following: Two people to help with walking and/or transfers;A lot of help with bathing/dressing/bathroom;Assistance with cooking/housework;Direct supervision/assist for medications management;Direct supervision/assist for financial management;Help with stairs or ramp for entrance    Functional Status Assessment  Patient has had a recent decline in their functional status and demonstrates the ability to make significant improvements in function in a reasonable and predictable amount of time.  Equipment Recommendations  Other (comment) (defer)    Recommendations for Other Services PT consult     Precautions / Restrictions Precautions Precautions: Fall Precaution Comments: multiple LE and foot wounds/ulcers Restrictions Weight Bearing Restrictions: Yes Other Position/Activity Restrictions: LLE WBAT      Mobility Bed Mobility Overal bed  mobility: Needs Assistance Bed Mobility: Sidelying to Sit, Sit to Sidelying   Sidelying to sit: +2 for physical assistance, Max assist     Sit to sidelying: Max assist, +2 for physical assistance General bed mobility comments: assist for trunk elevation and bringing BLE's off of bed    Transfers                   General transfer comment: deferred      Balance Overall balance assessment: Needs assistance Sitting-balance support: Bilateral upper extremity supported Sitting balance-Leahy Scale: Fair Sitting balance - Comments: posterior lean, relying on holding bed rails                                   ADL either performed or assessed with clinical judgement   ADL Overall ADL's : Needs assistance/impaired Eating/Feeding: Set up   Grooming: Set up   Upper Body Bathing: Moderate assistance   Lower Body Bathing: Maximal assistance   Upper Body Dressing : Moderate assistance   Lower Body Dressing: Maximal assistance   Toilet Transfer: Maximal assistance;+2 for physical assistance           Functional mobility during ADLs: Maximal assistance;+2 for physical assistance       Vision Baseline Vision/History: 1 Wears glasses Additional Comments: keeps eyes closed for majority of session, will further assess     Perception Perception: Not tested       Praxis Praxis: Not tested       Pertinent Vitals/Pain Pain Assessment Pain Assessment: Faces Pain Score: 6  Faces Pain Scale: Hurts even more Pain Location: R heel Pain Descriptors / Indicators: Discomfort, Grimacing Pain Intervention(s): Limited  activity within patient's tolerance, Monitored during session, Repositioned     Extremity/Trunk Assessment Upper Extremity Assessment Upper Extremity Assessment: Generalized weakness (hx of bil RTC injuries, unable to flex shoulders more than ~15*)   Lower Extremity Assessment Lower Extremity Assessment: Defer to PT evaluation   Cervical /  Trunk Assessment Cervical / Trunk Assessment: Kyphotic   Communication Communication Communication: Hearing impairment   Cognition Arousal: Lethargic Behavior During Therapy: Flat affect Overall Cognitive Status: No family/caregiver present to determine baseline cognitive functioning                                 General Comments: pt A & O x4, however keeps eyes closed and frequently grunts/nods instead of verbalizing responses to questions asked     General Comments  VSS on RA    Exercises     Shoulder Instructions      Home Living Family/patient expects to be discharged to:: Private residence Living Arrangements: Spouse/significant other;Children Available Help at Discharge: Available 24 hours/day;Family Type of Home: Mobile home Home Access: Stairs to enter [employer] of Steps: 4 Entrance Stairs-Rails: Right Home Layout: One level     Bathroom Shower/Tub: Producer, television/film/video: Handicapped height Bathroom Accessibility: Yes How Accessible: Accessible via walker Home Equipment: Agricultural consultant (2 wheels);Shower seat;Cane - single point;Rollator (4 wheels);Grab bars - tub/shower;Toilet riser;Hand held shower head;Other (comment) (lift chair)          Prior Functioning/Environment Prior Level of Function : Independent/Modified Independent;History of Falls (last six months)             Mobility Comments: pivot transferred and was ambulating with RW since leaving AIR ADLs Comments: ind, spouse reports pt was able to get to sink for oral care and perform bathing tasks        OT Problem List: Decreased strength;Decreased range of motion;Decreased activity tolerance;Impaired balance (sitting and/or standing);Decreased safety awareness;Pain;Impaired UE functional use      OT Treatment/Interventions: Self-care/ADL training;Therapeutic exercise;Energy conservation;DME and/or AE instruction;Therapeutic activities;Patient/family  education;Balance training    OT Goals(Current goals can be found in the care plan section) Acute Rehab OT Goals Patient Stated Goal: none stated OT Goal Formulation: With patient Time For Goal Achievement: 02/26/23 Potential to Achieve Goals: Good ADL Goals Pt Will Perform Upper Body Dressing: with min assist;sitting Pt Will Perform Lower Body Dressing: with min assist;sitting/lateral leans;sit to/from stand Pt Will Transfer to Toilet: with min assist;stand pivot transfer;squat pivot transfer;bedside commode Additional ADL Goal #1: pt will perform bed mobility min A in prep for seated ADLs  OT Frequency: Min 1X/week    Co-evaluation PT/OT/SLP Co-Evaluation/Treatment: Yes Reason for Co-Treatment: Complexity of the patient's impairments (multi-system involvement);For patient/therapist safety;To address functional/ADL transfers   OT goals addressed during session: ADL's and self-care;Strengthening/ROM      AM-PAC OT "6 Clicks" Daily Activity     Outcome Measure Help from another person eating meals?: A Little Help from another person taking care of personal grooming?: A Little Help from another person toileting, which includes using toliet, bedpan, or urinal?: A Lot Help from another person bathing (including washing, rinsing, drying)?: A Lot Help from another person to put on and taking off regular upper body clothing?: A Lot Help from another person to put on and taking off regular lower body clothing?: Total 6 Click Score: 13   End of Session Nurse Communication: Mobility status (IV beeping)  Activity Tolerance: Patient tolerated  treatment well Patient left: in bed;with call bell/phone within reach  OT Visit Diagnosis: Unsteadiness on feet (R26.81);Other abnormalities of gait and mobility (R26.89);Muscle weakness (generalized) (M62.81)                Time: 1247-1310 OT Time Calculation (min): 23 min Charges:  OT General Charges $OT Visit: 1 Visit OT Evaluation $OT Eval  Moderate Complexity: 1 Mod  Yalitza Teed K, OTD, OTR/L SecureChat Preferred Acute Rehab [phone]  Mikena Masoner K Koonce 02/12/2023, 1:26 PM

## 2023-02-12 NOTE — Anesthesia Procedure Notes (Signed)
Procedure Name: MAC Date/Time: 02/12/2023 8:02 AM  Performed by: Randon Goldsmith, CRNAPre-anesthesia Checklist: Patient identified, Emergency Drugs available, Suction available and Patient being monitored Patient Re-evaluated:Patient Re-evaluated prior to induction Oxygen Delivery Method: Nasal cannula

## 2023-02-12 NOTE — Progress Notes (Signed)
Examined patient at bedside for assessment of volume status. He was resting comfortably in bed.   MAP >65, HR 85, RR 18 on RA with 96% SaO2. On examination, normal RR without increased WOB, anterior auscultation without rales, crackles or wheezing. Warm skin, with decreased skin turgor throughout. Patient received 800 mL IV fluid resuscitation after procedure during day shift. No changes to his current management. If MAP <65, will consider another low volume IVF bolus in setting of known HFrecEF. Discussed with RN who reported no concerns about pain; however, no output documented during this shift. Bladder scan planned shortly.  Morene Crocker, MD [facility] Internal Medicine Program - PGY-2 02/12/2023, 11:30 PM Please contact the on call pager at [phone].

## 2023-02-12 NOTE — Consult Note (Signed)
WOC Nurse Consult Note: Reason for Consult:Right lateral foot, right second toe with visible bone, Left dorsal and plantar foot and left first through fourth toes with ischemic wounds.  Not a candidate for vascular intervention at this time per CV team Left ankle with septic arthritis, s/P I & D of left ankle.  Stage 2 pressure injury to sacrum, present on admission.  Improving with care at home, per family. In the setting of moderate malnutrition Wound type: vascular and pressure Pressure Injury POA: Yes sacrum Measurement: ischemic wounds with dry black eschar.  Wound BJY:NWGNFA to feet Pink to sacral wound Drainage (amount, consistency, odor)scant weeping to sacrum  Periwound: dry skin. Dressing procedure/placement/frequency: Cleanse wounds to left and right feet with soap and water and pat dry. Paint wounds with betadine and dry gauze, secure with kerlix and tape.  Change daily.  Cleanse sacral wound with NS and pat dry. Apply VASHE (LAWSON # [id number]) moist gauze to wound bed and secure with foam dressing. Change VASHE dressing daily and replace foam every three days and PRN soilage.  Will not follow at this time.  Please re-consult if needed.  Mike Gip MSN, RN, FNP-BC CWON Wound, Ostomy, Continence Nurse Outpatient [facility] [phone] Pager [phone]

## 2023-02-12 NOTE — Progress Notes (Addendum)
Subjective:   [name] [name] is an 82 year old male with a past medical history of A-fib on Eliquis, PAD with chronic lower extremity and sacral wounds, recent MRSA bacteremia, aortic valve replacement and mitral insufficiency who presented with worsening p.o. intake nausea and vomiting and was admitted for suspected bacteremia.  He is now on hospital day 1.  Patient had a hypoglycemic event to CBG: 50 in the setting of 10 units NovoLog, given as a temporizing measure.  Was given a 50 mL bolus of D50, follow-up CBG: 140.  Otherwise no acute events.  No medication refusals.  PRNs: Tramadol 50 mg x1 for moderate pain.  Patient is due for formal joint irrigations/debridement of left ankle effusion this morning with suspicion for septic arthritis - 3 to 4 cc of purulent withdrawn yesterday by Ortho.  On interview post-left ankle washout with wife in room, patient is lethargic, but can answer questions.  Surgery appears to have stated uneventfully.  Patient reports trouble swallowing and reports nausea.  He also reports symptoms of fevers and chills. Wife reports he appears confused first thing in the morning. Pt is alert and oriented self, reports year 2014 and that he is in [place] hospital.  Wife is concerned of patient becoming volume up in the setting of IV fluids, allayed concerns and explained that proper oxygen perfusion is very important and that we can treat hypervolemia if necessary.  Suggested we begin patient on boost breeze, which he drinks at home.  Objective:  BP: 96/58.  HR: 86.  Resp: 20.  Satting 96% on room air.  Afebrile.  Notably, patient was hypotensive to 86/36 with a MAP of 48 overnight.  Patient received a total of 1.4 L IV fluid yesterday, urine output/PO and measured.  Vital signs in last 24 hours: Vitals:   02/11/23 1549 02/11/23 1909 02/11/23 2357 02/12/23 0200  BP: (!) 96/51 92/64 (!) 118/91 (!) 96/58  Pulse: 76 80 86 86  Resp: 18 20 20 20   Temp: 97.8 F (36.6 C)  97.7 F (36.5 C) 97.7 F (36.5 C)   TempSrc: Oral Oral Axillary   SpO2: 91% 96% 97% 96%  Weight:      Height:       Physical exam:  Constitutional: Chronically ill-appearing man laying in bed, no acute distress Cardiovascular: Regular rhythm, regular rate. Respiratory: No increased work of breathing, clear to auscultation.  Abdominal: Bowel since present, no tenderness of 4 quadrants MSK: Lateral lower extremities with multiple sores, as before.  In SCDs.  Bandage over her left ankle joint and right toe. Neuro: Neuro exam deferred, grossly intact. Psych: Oriented to self, place, but not to year.  Otherwise alert, if lethargic  Pertinent labs:  K: 5.4 (5.6) A.m. fasting glucose: 96 BUN: 62 Creatinine 2.19 (2.17) Corrected calcium: 9.9 AST: 243 (319) ALT: 169 (157) Alkaline phosphatase: 164 (187) Bilirubin 0.9 (1.3)  Hemoglobin: 7.9 (8.3) Platelets: 197  Vitamin B12: 909 within normal limits Folate: 35.4 within normal limits  Blood cultures: No growth at 12 hours Body fluid cultures: No growth at less than 24 hours  Pertinent imaging (emphasis mine):  XR Chest (8/26):  IMPRESSION: 1. Cardiomegaly and mild pulmonary vascular congestion without frank edema. 2. Left greater than right pleural effusions and bibasilar airspace disease, left greater than right. While this may represent atelectasis, infection is not excluded.  XR Ankle (8/26):  IMPRESSION: 1. Soft tissue swelling and moderate-sized joint effusion without acute or focal osseous abnormality. 2. Plantar calcaneal spur. 3.  Mild osteopenia.  CT chest abdomen and pelvis without contrast (8/26):  IMPRESSION: Stable small to moderate bilateral pleural effusions and bilateral lower lobe atelectasis.   Resolution of previously seen airspace disease in right upper and middle lobes. No new or increased areas of pulmonary opacity.   Stable 4 mm right upper lobe pulmonary nodule. No follow-up needed if  patient is low-risk. Non-contrast chest CT can be considered in 12 months if patient is high-risk. This recommendation follows the consensus statement: Guidelines for Management of Incidental Pulmonary Nodules Detected on CT Images: From the Fleischner Society 2017; Radiology 2017; 284:228-243.   Cholelithiasis. No radiographic evidence of cholecystitis.   Large left inguinal hernia containing sigmoid colon. No evidence of bowel obstruction or strangulation.   Colonic diverticulosis, without radiographic evidence of diverticulitis.   Mildly enlarged prostate.   Probable tiny bladder calculi. No evidence of ureteral calculi or hydronephrosis.   Mild L1 vertebral body compression fracture, new since prior study.  MR left ankle without contrast (8/26):  IMPRESSION: 1. Moderate tibiotalar joint effusion with mild endosteal edema along the tibiotalar joint. This is nonspecific and could be degenerative or inflammatory, but septic arthritis is not difficult to totally exclude. There less synovitis than I would typically expect in septic arthritis. Correlate clinically in determining whether arthrocentesis is indicated. 2. Torn flexor digitorum longus tendon, only well appreciated distal to the knot of Henry. 3. Expanded tibialis anterior tendon compatible with tendinopathy. Trace metal artifact along the distal tendon, query prior surgical reattachment. 4. Moderate distal Achilles tendinopathy. 5. Mild thickening of the medial band of the plantar fascia, cannot exclude mild plantar fasciitis. 6. Peroneus brevis tendinopathy posterior to the lateral malleolus. 7. Attenuated appearance of the anterior talofibular ligament suggesting a remote injury. 8. Subcutaneous edema along the anterior ankle and along the plantar and dorsal foot. No abscess identified.  US Abdomen Limited RUQ (8/26):  IMPRESSION: Gallbladder sludge and stones. Mild wall thickening but there is some mild  adjacent ascites. No ductal dilatation. If there is further concern of acute cholecystitis, HIDA scan could be considered as clinically appropriate  Transthoracic echocardiogram (8/27):  IMPRESSIONS    1. Left ventricular ejection fraction, by estimation, is 30 to 35%. The  left ventricle has moderately decreased function. The left ventricle  demonstrates global hypokinesis. The left ventricular internal cavity size  was moderately dilated.   2. Right ventricular systolic function is moderately reduced. The right  ventricular size is moderately enlarged. There is moderately elevated  pulmonary artery systolic pressure. The estimated right ventricular  systolic pressure is 56.7 mmHg.   3. Left atrial size was severely dilated.   4. Right atrial size was severely dilated.   5. Moderate pleural effusion in the left lateral region.   6. The mitral valve is degenerative. Moderate mitral valve regurgitation.  No evidence of mitral stenosis. Moderate mitral annular calcification.   7. Tricuspid valve regurgitation is moderate.   8. The aortic valve has been repaired/replaced. Aortic valve  regurgitation is not visualized. There is a unknown a valve present in the  aortic position. Procedure Date: 04/09/09.   9. The inferior vena cava is dilated in size with <50% respiratory  variability, suggesting right atrial pressure of 15 mmHg.   Assessment/Plan:  Principal Problem:   Altered mental state Active Problems:   HTN (hypertension)   Normocytic anemia   CAD (coronary artery disease)   S/P AVR   Hyperlipidemia   Pleural effusion   Atrial fibrillation (HCC)  Chronic diastolic heart failure (HCC)  #Concern for bacteremia of unknown etiology, improving #Hypotension #History of MRSA bacteremia  Recent presentation of nausea, vomiting, lethargy and failure to thrive likely due to gram-negative bacteremia secondary to one of his multiple open wounds (R second digit, sacral decubitus  ulcer). Joint effusion of the left ankle presents likeliest source, MRI order placed.  Chest x-ray showed bilateral pleural effusions, unlikely primary source at this time as patient does not currently express significant respiratory symptoms.   Blood cultures drawn.  Begun on broad-spectrum IV antibiotics.    8/26: Patient hypotensive today to 90s/50s, will further replete with IVF. Will monitor volume status throughout the day. Per ID consult note, plan is to continue the vancomycin, cefepime and Flagyl for now.  Also recommended right upper quadrant ultrasound -- showed gallbladder sludge and stones with mild wall thickening.  No ductal dilation.  Blood cultures: no growth at 12 hours.  TTE negative for vegetations.  Body fluid will tailor antibiotic regimen as cultures result. - Begin IV LR 1 L over 100 ml/hr over next 10 hours to better perfuse in setting of bacteremia. Will monitor volume status accordingly. - ID will continue to follow, appreciate continued recs. - Continue IV cefepime 2 g daily (8/25 -). - Continue IV vancomycin 1 g every 36 hours (8/25 -). - Continue IV Flagyl 500 mg every 12 hours (8/25 -). - Await blood/aspiration/entry surgical cultures.  #Failure to thrive #Lethargy #Nausea/vomiting Has had 1 week or so for worsening failure to thrive.  On interview, patient said that he both has some difficulty swallowing and general distaste for food.  Ate very little of his diet.  OT was unable to see today as patient was in surgery. - Began Boost breeze supplementation - Speech consult placed, appreciate recs   #PAD complicated by multiple chronic wounds (bilateral lower extremity, sacral) #Joint effusion of the left ankle #Stage 2 sacral decubitus ulcer Patient was taken for left ankle joint washout after Ortho drew 3 to 4 cc of purulent fluid from aspiration site.  Cultures sent.  MRI of left ankle raise concern for degenerative versus inflammatory etiology.  If patient status  improves, could attempt to tailor antibiotic regimen based on culture results.  Patient in the past has not been amenable to amputation of any kind.  Per wound care note, patient has a stage II pressure injury to his sacrum.  TTE was negative for vegetations, making bilateral lower extremity wounds likely source of infection.  Wound care signed off, recs below: - Cleanse wounds to left and right feet with soap and water and pat dry. - Paint wounds with betadine and dry gauze, secure with kerlix and tape.  Change daily.  - Cleanse sacral wound with NS and pat dry. Apply VASHE (LAWSON # [id number]) moist gauze to wound bed and secure with foam dressing. Change VASHE dressing daily and replace foam every three days and PRN soilage.   - Await washout and joint aspiration cultures with Gram stain  #Hyperkalemia Hyperkalemia has resolved to 5.4 overnight after being given Lokelma as well as previously mentioned IV fluids. Given calcium gluconate 1 g in the ED. Given insulin aspart 10 units x1 with 1 bolus of D50 simultaneously.  Patient was on will replete fluids as needed.  Every 2 hours CBG monitoring 8/25 x1 in the setting of temporizing with insulin, now every 4 hours. - Consider further spacing/discontinuing CBG monitoring.. - Follow up with morning labs 8/27th   #AKI BUN/CR: 62/2.19.  Baseline cr is ~1.8.  Patient finished IV LR 750 mL yesterday for a total of 1.4 L IVF. Continue to believe that this AKI is prerenal in nature in the setting of poor p.o. intake, with an element of overlying CKD.  If BUN/creatinine remains high on tomorrow morning labs, will consider bladder scan/post renal workup.  - Continue home midodrine 10 mg 3 times daily to increase perfusion. - Follow up with morning labs 8/27.  #Elevated LFTs Unknown etiology at this time, perhaps reactive to underlying infection. Less likely cholestatic as abdominal exam negative, no clinical signs of jaundice.  AST downtrending to 243 from 319,  ALT stable at 167, alk phos and bilirubin also trending in the right direction.  Right upper quadrant showed gallbladder sludge and stones but no ductal dilation.  If LFTs remain high, could consider HIDA scan for further obstructive workup, however LFTs improved somewhat and setting of antibiotics and IV fluids. - Follow up with morning labs 8/27.  #History of aortic valve replacement #History of mitral insufficiency Patient also has a history of aortic valve replacement and mitral insufficiency, increasing risk that these were seeded by his previous MRSA bacteremia.  Blood cultures drawn as above.  Mitral valve is degenerative with annular calcification.  No vegetations noted. - TTE showed no indication of valvular vegetative seeding or other infectious etiology, ruling this out as source of current bacteremia  #Left greater than right pleural effusions #CHF (35-40%) Patient appeared largely euvolemic.  Somewhat reduced lung sounds in the bases. - Continue PO Lasix 20 mg daily as needed for hypervolemia.   #Atrial fibrillation #QTc prolongation Currently in Afib. Not in RPR. Qtc: 541. Avoid giving further Zofran and other QTc prolonging medications. - Continue home Eliquis 2.5 mg twice daily. - Repeat EKG 8/27.  #Macrocytic anemia In setting of poor PO.  Folate and B12 levels within normal limits. Can be seen in setting of nucleated red blood cells.    #Goals of care - Patient was made DNR/DNI on previous hospital admission.  Prior to Admission Living Arrangement: Home with wife Anticipated Discharge Location: To be determined Barriers to Discharge: Bacteremic picture Dispo: Anticipated discharge in approximately 3-5 day(s).   Tomie China, MD 02/12/2023, 7:01 AM Pager: [phone] After 5pm on weekdays and 1pm on weekends: On Call pager [phone]

## 2023-02-12 NOTE — Op Note (Signed)
Orthopaedic Surgery Operative Note (CSN: [id number] ) Date of Surgery: 02/12/2023  Admit Date: 02/11/2023   Diagnoses: Pre-Op Diagnoses: Left ankle septic arthritis  Post-Op Diagnosis: Same  Procedures: CPT 27610-Incision and drainage of left ankle  Surgeons : Primary: Roby Lofts, MD  Assistant: Ulyses Southward, PA-C  Location: OR 3   Anesthesia: Local   Antibiotics: Scheduled antibiotics  Tourniquet time: None    Estimated Blood Loss: 25 mL  Complications:* No complications entered in OR log *   Specimens: ID Type Source Tests Collected by Time Destination  A : Left Ankle Body Fluid Path fluid AEROBIC/ANAEROBIC CULTURE W GRAM STAIN (SURGICAL/DEEP WOUND) Roby Lofts, MD 02/12/2023 [phone]      Implants: * No implants in log *   Indications for Surgery: 82 year old male with a significant past medical history who developed bacteremia and subsequently had significant pain in his left ankle.  An MRI was performed which showed a large aspiration.  Orthopedics was consulted and aspiration was performed.  This showed frank pus.  As result of this it felt that he was indicated for I&D of his left ankle.  Risks and benefits were discussed with the patient's wife.  Risks included but not limited to bleeding, persistent infection, osteomyelitis, posttraumatic arthritis, nerve and blood vessel injury, even the possibility anesthetic complications.  She agreed to proceed with surgery and consent was obtained.  Operative Findings: Successful incision and drainage of left septic ankle arthritis  Procedure: The patient was identified in the preoperative holding area. Consent was confirmed with the patient and their family and all questions were answered. The operative extremity was marked after confirmation with the patient. he was then brought back to the operating room by our anesthesia colleagues.  He was carefully transferred over to radiolucent flattop table.  He was given some  light sedation.  His left lower extremity was then prepped and draped in usual sterile fashion.  A timeout was performed to verify the patient, the procedure, and the extremity.  Preoperative antibiotics had already been given from his scheduled antibiotics.  Half percent Marcaine was injected locally into the field where a anterior medial incision was going to be made.  I injected deep down to the ankle capsule as well as into the soft tissue surrounding it.  I then made an incision over the anterior medial aspect of the ankle carried it down through skin and subcutaneous tissue.  I entered the ankle joint just medial to the anterior tibialis tendon.  Here I encountered a wave of fluid that cultured and sent to the lab.  I extended it proximally and distally and used a scissors to enter the joint and create a wider opening.  I then proceeded to use cystoscopy tubing to irrigate the wound with approximately 2 L of normal saline.  At this point we then removed the irrigation and then closed with interrupted 3 oh nylons.  Sterile dressing was applied.  The patient was then awoken from anesthesia and taken to the PACU in stable condition.   Debridement type: Excisional Debridement  Side: left  Body Location: Ankle  Tools used for debridement: scalpel and scissors  Pre-debridement Wound size (cm):   N/A-closed  Post-debridement Wound size (cm):   N/A-closed  Debridement depth beyond dead/damaged tissue down to healthy viable tissue: yes  Tissue layer involved: skin, subcutaneous tissue, muscle / fascia  Nature of tissue removed: Purulence  Irrigation volume: 2 L     Irrigation fluid type: Normal Saline  Post Op Plan/Instructions: Patient be weightbearing as tolerated to the left lower extremity.  He will receive antibiotics per infectious disease and primary team.  Mobilize with physical and Occupational Therapy.  DVT prophylaxis will be at the discretion of the primary team.  I was present  and performed the entire surgery.  Ulyses Southward, PA-C did assist me throughout the case. An assistant was necessary given the difficulty in approach, maintenance of reduction and ability to instrument the fracture.   Truitt Merle, MD [facility]

## 2023-02-12 NOTE — Progress Notes (Signed)
OT Cancellation Note  Patient Details Name: [name] [name] MRN: [id number] DOB: Dec 02, 1940   Cancelled Treatment:    Reason Eval/Treat Not Completed: Patient at procedure or test/ unavailable (off unit for I&D, will follow up for OT evaluation as schedule permits)  Carver Fila, OTD, OTR/L [facility] [phone]   Dalphine Handing 02/12/2023, 7:49 AM

## 2023-02-13 ENCOUNTER — Inpatient Hospital Stay (HOSPITAL_COMMUNITY): Payer: Medicare Other

## 2023-02-13 ENCOUNTER — Encounter (HOSPITAL_COMMUNITY): Payer: Self-pay | Admitting: Student

## 2023-02-13 DIAGNOSIS — N179 Acute kidney failure, unspecified: Secondary | ICD-10-CM | POA: Diagnosis not present

## 2023-02-13 DIAGNOSIS — I509 Heart failure, unspecified: Secondary | ICD-10-CM

## 2023-02-13 DIAGNOSIS — R7989 Other specified abnormal findings of blood chemistry: Secondary | ICD-10-CM

## 2023-02-13 DIAGNOSIS — J9601 Acute respiratory failure with hypoxia: Secondary | ICD-10-CM | POA: Insufficient documentation

## 2023-02-13 DIAGNOSIS — M00272 Other streptococcal arthritis, left ankle and foot: Secondary | ICD-10-CM

## 2023-02-13 DIAGNOSIS — I959 Hypotension, unspecified: Secondary | ICD-10-CM | POA: Diagnosis not present

## 2023-02-13 DIAGNOSIS — J90 Pleural effusion, not elsewhere classified: Secondary | ICD-10-CM

## 2023-02-13 LAB — COMPREHENSIVE METABOLIC PANEL
ALT: 191 U/L — ABNORMAL HIGH (ref 0–44)
AST: 222 U/L — ABNORMAL HIGH (ref 15–41)
Albumin: 2.4 g/dL — ABNORMAL LOW (ref 3.5–5.0)
Alkaline Phosphatase: 181 U/L — ABNORMAL HIGH (ref 38–126)
Anion gap: 13 (ref 5–15)
BUN: 69 mg/dL — ABNORMAL HIGH (ref 8–23)
CO2: 12 mmol/L — ABNORMAL LOW (ref 22–32)
Calcium: 8.1 mg/dL — ABNORMAL LOW (ref 8.9–10.3)
Chloride: 116 mmol/L — ABNORMAL HIGH (ref 98–111)
Creatinine, Ser: 2.51 mg/dL — ABNORMAL HIGH (ref 0.61–1.24)
GFR, Estimated: 25 mL/min — ABNORMAL LOW (ref >60)
Glucose, Bld: 110 mg/dL — ABNORMAL HIGH (ref 70–99)
Potassium: 5.2 mmol/L — ABNORMAL HIGH (ref 3.5–5.1)
Sodium: 141 mmol/L (ref 135–145)
Total Bilirubin: 1 mg/dL (ref 0.3–1.2)
Total Protein: 5.8 g/dL — ABNORMAL LOW (ref 6.5–8.1)

## 2023-02-13 LAB — CBC
HCT: 29.7 % — ABNORMAL LOW (ref 39.0–52.0)
Hemoglobin: 8.5 g/dL — ABNORMAL LOW (ref 13.0–17.0)
MCH: 31.6 pg (ref 26.0–34.0)
MCHC: 28.6 g/dL — ABNORMAL LOW (ref 30.0–36.0)
MCV: 110.4 fL — ABNORMAL HIGH (ref 80.0–100.0)
Platelets: 180 10*3/uL (ref 150–400)
RBC: 2.69 MIL/uL — ABNORMAL LOW (ref 4.22–5.81)
RDW: 24.1 % — ABNORMAL HIGH (ref 11.5–15.5)
WBC: 10.1 10*3/uL (ref 4.0–10.5)
nRBC: 0.4 % — ABNORMAL HIGH (ref 0.0–0.2)

## 2023-02-13 LAB — GLUCOSE, CAPILLARY: Glucose-Capillary: 109 mg/dL — ABNORMAL HIGH (ref 70–99)

## 2023-02-13 MED ORDER — DOXYCYCLINE HYCLATE 100 MG PO TABS: 100.0000 mg | ORAL_TABLET | Freq: Two times a day (BID) | ORAL | Status: DC

## 2023-02-13 MED ORDER — FUROSEMIDE 10 MG/ML IJ SOLN
40.0000 mg | Freq: Once | INTRAMUSCULAR | Status: AC
  Administered 2023-02-13: 40 mg via INTRAVENOUS
  Filled 2023-02-13: qty 4

## 2023-02-13 MED ORDER — ALBUMIN HUMAN 25 % IV SOLN
25.0000 g | Freq: Once | INTRAVENOUS | Status: AC
  Administered 2023-02-13: 25 g via INTRAVENOUS
  Filled 2023-02-13: qty 100

## 2023-02-13 MED ORDER — SODIUM CHLORIDE 0.9 % IV SOLN
8.0000 mg/kg | INTRAVENOUS | Status: DC
  Administered 2023-02-14: 600 mg via INTRAVENOUS
  Filled 2023-02-13: qty 12

## 2023-02-13 NOTE — Progress Notes (Signed)
[facility] Infectious Diseases Follow Up Note  Patient Identification: Patient Name: [name] [name] MRN: [id number] Admit Date: 02/11/2023  7:06 AM Age: 82 y.o.Today's Date: 02/13/2023  Reason for Visit: complicated infection h/o abtx management   Principal Problem:   Altered mental state Active Problems:   Normocytic anemia   CAD (coronary artery disease)   S/P AVR   Hyperlipidemia   Pleural effusion   Atrial fibrillation (HCC)   Chronic diastolic heart failure (HCC)   Septic arthritis of left ankle (HCC)   Acute hypoxic respiratory failure (HCC)   Antibiotics: Vancomycin 8/25- Cefepime 8/25- Metronidazole 8/25-  Lines/Hardwares: Bilateral TKR, thoracic stent graft   Interval Events:   Assessment 82 year old male with PMH as below including CAD, chronic combined CHF, permanent A-fib/flutter on [facility], aortic insufficiency and 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   # Left ankle septic arthritis  8/25 arthrocentesis. No WBC but neutrophilic, No crystals. cx NGTD 8/26 s/p I and D of left ankle. Cx re-incubating  # Recurrent b/l pleural effusion s/p thoracentesis in 12/03/22 and considered to be transudative ( reactive mesothelial cells in path) - Nausea/vomiting - started after taking linezolid on top of doxycycline and possibly abtx related  # AKI on  CKD  # Transaminitis - in the setting of  infection. 8/25 RUQ [place] with no acute abnormality, No symptoms or signs of acute cholecystitis.  # Sacral DU  # Disseminated MRSA infection as above   Recommendations Will switch Vancomycin to daptomycin for given CKD and add doxycycline for pulmonary coverage ( Prior MRSA S to doxycycline) Monitor CBC, CMP and CPK Fu OR cx  He had recurrent pleural effusion and could be due to his CHF/Underlying malnutrition and hypoalbuminemia. May need therapeutic thoracentesis if symptomatic.  Monitor CBC, CMP and CPK  Agree with goals of care discussion   Rest of the management as per the primary team. Thank you for the consult. Please page with pertinent questions or concerns.  ______________________________________________________________________ Subjective patient seen and examined at the bedside. Spoke with wife at bedside.   Past Medical History:  Diagnosis Date   Acute on chronic diastolic heart failure (HCC) 08/22/2013   Acute on chronic respiratory failure with hypoxia (HCC) 10/03/2019   Acute on chronic systolic heart failure (HCC) 05/11/2013   EF from 35% to 50-55% on 6/14 ECHO.    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 No evidence of dissection or endoleak.  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 No evidence of abdominal aortic dissection.  Occluded proximal left subclav   Aortic valve insufficiency    Arthritis    "probably in my knees before they replaced them" (  01/28/2015)   Atrial fibrillation (HCC) 05/21/2013   Atrial flutter (HCC)    Bradycardia 08/23/2013   CAD (coronary artery disease)    Chest pain  01/28/2015   CHF (congestive heart failure) (HCC)    Chronic diastolic heart failure (HCC) 01/28/2015   Descending aortic aneurysm (HCC)    DVT (deep venous thrombosis) (HCC)    ?LLE post knee surgery   Dyspnea 09/2019   HCAP (healthcare-associated pneumonia) 10/11/2013   History of blood transfusion    "after one of my knee surgeries"   HTN (hypertension)    Hyperlipidemia    Hypotension 12/17/2019   Incarcerated left inguinal hernia s/p repair 12/02/2019 12/01/2019   Insomnia 09/04/2013   Multiple fractures of ribs, right side, init for clos fx 09/04/2019   Near syncope 01/28/2015   Pressure injury of skin 10/06/2019   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 12/18/2019 12/17/2019   Recurrent UTI 10/06/2013   Shock circulatory (HCC) 10/07/2013   Thoracic aneurysm    Tobacco abuse    Urinary retention 10/06/2013   UTI (urinary tract infection) 10/06/2013     Past Surgical History:  Procedure Laterality Date   AORTIC VALVE REPLACEMENT  02/19/2009   AORTO-FEMORAL BYPASS GRAFT  03/2010   ascending aortic and arch aneurysm repair/notes 04/09/2009   CARDIAC VALVE REPLACEMENT     CAROTID-SUBCLAVIAN BYPASS GRAFT Left 08/26/2013   Procedure: BYPASS GRAFT CAROTID-SUBCLAVIAN;  Surgeon: Nada Libman, MD;  Location: [facility] OR;  Service: Vascular;  Laterality: Left;   CATARACT EXTRACTION W/ INTRAOCULAR LENS  IMPLANT, BILATERAL Bilateral    ENDOVASCULAR STENT INSERTION N/A 08/26/2013   Procedure:  THORACIC STENT GRAFT INSERTION;  Surgeon: Nada Libman, MD;  Location: [facility] OR;  Service: Vascular;  Laterality: N/A;   EYE SURGERY     I & D EXTREMITY Left 02/12/2023   Procedure: IRRIGATION AND DEBRIDEMENT, ANKLE;  Surgeon: Roby Lofts, MD;  Location: [facility] OR;  Service: Orthopedics;  Laterality: Left;   INGUINAL HERNIA REPAIR Left 12/02/2019   Procedure: REPAIR LEFT INGUINAL HERNIA WITH MESH;  Surgeon: Violeta Gelinas, MD;  Location: [facility] OR;  Service: General;  Laterality:  Left;   INGUINAL HERNIA REPAIR Left 12/18/2019   Procedure: LAPAROSCOPIC ASSISTED REPAIR OF RECURRENT INCARCERATED LEFT INGUINAL HERNIA WITH MESH; LAPAOSCOPIC REPAIR OF SEROSAL TEAR;  Surgeon: Gaynelle Adu, MD;  Location[facility] OR;  Service: General;  Laterality: Left;   INSERTION OF MESH Left 12/02/2019   Procedure: INSERTION OF MESH;  Surgeon: Violeta Gelinas, MD;  Location: [facility] OR;  Service: General;  Laterality: Left;   JOINT REPLACEMENT     KNEE ARTHROSCOPY Bilateral    REPLACEMENT TOTAL KNEE Bilateral    right groin lymphocele Right 03/29/2009   RIGHT/LEFT HEART CATH AND CORONARY ANGIOGRAPHY N/A 10/06/2019   Procedure: RIGHT/LEFT HEART CATH AND CORONARY ANGIOGRAPHY;  Surgeon: Lyn Records, MD;  Location: [facility] INVASIVE CV LAB;  Service: Cardiovascular;  Laterality: N/A;   THORACENTESIS  2010 X 2   TONSILLECTOMY       Vitals BP (!) 111/47 (BP Location: Right Arm)   Pulse 85   Temp (!) 97.5 F (36.4 C) (Oral)   Resp 16   Ht 5\' 10"  (1.778 m)   Wt 72.9 kg   SpO2 96%   BMI 23.06 kg/m    Physical Exam Constitutional:  chronically ill appearing male lying in the bed, lethargic    Comments: malnourished   Cardiovascular:     Rate and Rhythm: Normal rate  and regular rhythm.     Heart sounds: s1s2  Pulmonary:     Effort: Pulmonary effort is normal.     Comments: decreased air entry in bases   Abdominal:     Palpations: Abdomen is soft.     Tenderness: non distended and non tender   Musculoskeletal:        General: Left ankle is wrapped in a bandage, ischemic toes, no other peripheral joint pain and swelling   Skin:    Comments: sacral DU, aunable to examine   Neurological:     General: he is oriented and follows basic commands but lethargic    Pertinent Microbiology Results for orders placed or performed during the hospital encounter of 02/11/23  Blood Culture (routine x 2)     Status: None (Preliminary result)   Collection Time: 02/11/23  8:00 AM   Specimen: BLOOD   Result Value Ref Range Status   Specimen Description BLOOD RIGHT ANTECUBITAL  Final   Special Requests   Final    BOTTLES DRAWN AEROBIC AND ANAEROBIC Blood Culture adequate volume   Culture   Final    NO GROWTH 1 DAY Performed at [facility], [address], [place], [place] [address]    Report Status PENDING  Incomplete  Blood Culture (routine x 2)     Status: None (Preliminary result)   Collection Time: 02/11/23  8:00 AM   Specimen: BLOOD LEFT HAND  Result Value Ref Range Status   Specimen Description BLOOD LEFT HAND  Final   Special Requests   Final    BOTTLES DRAWN AEROBIC AND ANAEROBIC Blood Culture adequate volume   Culture   Final    NO GROWTH 1 DAY Performed at [facility], [address], [place], [place] [address]    Report Status PENDING  Incomplete  SARS Coronavirus 2 by RT PCR (hospital order, performed in [facility] hospital lab) *cepheid single result test* Anterior Nasal Swab     Status: None   Collection Time: 02/11/23  8:24 AM   Specimen: Anterior Nasal Swab  Result Value Ref Range Status   SARS Coronavirus 2 by RT PCR NEGATIVE NEGATIVE Final    Comment: Performed at [facility], [address] [place], [place] [address]  MRSA Next Gen by PCR, Nasal     Status: None   Collection Time: 02/11/23  1:37 PM   Specimen: Anterior Nasal Swab  Result Value Ref Range Status   MRSA by PCR Next Gen NOT DETECTED NOT DETECTED Final    Comment: (NOTE) The GeneXpert MRSA Assay (FDA approved for NASAL specimens only), is one component of a comprehensive MRSA colonization surveillance program. It is not intended to diagnose MRSA infection nor to guide or monitor treatment for MRSA infections. Test performance is not FDA approved in patients less than 61 years old. Performed at [facility], [address], [place], [place] [address]   Body fluid culture w Gram Stain     Status: None (Preliminary result)   Collection Time: 02/11/23  5:38 PM    Specimen: Synovium; Body Fluid  Result Value Ref Range Status   Specimen Description SYNOVIAL  Final   Special Requests ANKLE  Final   Gram Stain   Final    ABUNDANT WBC PRESENT,BOTH PMN AND MONONUCLEAR NO ORGANISMS SEEN    Culture   Final    NO GROWTH < 24 HOURS Performed at [facility], [address]  [place], [place] [address]    Report Status PENDING  Incomplete  Anaerobic culture w Gram Stain     Status: None (Preliminary result)   Collection Time: 02/11/23  5:38 PM   Specimen: Synovium  Result Value Ref Range Status   Specimen Description SYNOVIAL  Final   Special Requests ANKLE  Final   Gram Stain   Final    MODERATE WBC PRESENT, PREDOMINANTLY PMN NO ORGANISMS SEEN Performed at [facility], [address] [place], [place] [address]    Culture PENDING  Incomplete   Report Status PENDING  Incomplete  Aerobic/Anaerobic Culture w Gram Stain (surgical/deep wound)     Status: None (Preliminary result)   Collection Time: 02/12/23  8:09 AM   Specimen: Path fluid; Body Fluid  Result Value Ref Range Status   Specimen Description FLUID  Final   Special Requests LEFT ANKLE  Final   Gram Stain   Final    RARE WBC PRESENT, PREDOMINANTLY PMN NO ORGANISMS SEEN    Culture   Final    CULTURE REINCUBATED FOR BETTER GROWTH Performed at [facility], [address], [place], [place] [address]    Report Status PENDING  Incomplete  '  Pertinent Lab.    Latest Ref Rng & Units 02/13/2023    7:25 AM 02/12/2023    3:30 AM 02/11/2023    7:57 AM  CBC  WBC 4.0 - 10.5 K/uL 10.1  8.5  8.8   Hemoglobin 13.0 - 17.0 g/dL 8.5  7.9  8.3   Hematocrit 39.0 - 52.0 % 29.7  26.6  29.1   Platelets 150 - 400 K/uL 180  197  284       Latest Ref Rng & Units 02/13/2023    7:25 AM 02/12/2023    3:30 AM 02/11/2023    8:26 PM  CMP  Glucose 70 - 99 mg/dL 638  756    BUN 8 - 23 mg/dL 69  62    Creatinine 4.33 - 1.24 mg/dL 2.95  1.88    Sodium 416 - 145 mmol/L 141  143    Potassium  3.5 - 5.1 mmol/L 5.2  5.4  5.6   Chloride 98 - 111 mmol/L 116  111    CO2 22 - 32 mmol/L 12  15    Calcium 8.9 - 10.3 mg/dL 8.1  8.6    Total Protein 6.5 - 8.1 g/dL 5.8  5.7    Total Bilirubin 0.3 - 1.2 mg/dL 1.0  0.9    Alkaline Phos 38 - 126 U/L 181  164    AST 15 - 41 U/L 222  243    ALT 0 - 44 U/L 191  169       Pertinent Imaging today Plain films and CT images have been personally visualized and interpreted; radiology reports have been reviewed. Decision making incorporated into the Impression  DG CHEST PORT 1 VIEW  Result Date: 02/13/2023 CLINICAL DATA:  Shortness of breath, fatigue and altered mental status. EXAM: PORTABLE CHEST 1 VIEW COMPARISON:  CT 02/11/2023 FINDINGS: Previous median sternotomy. Stent graft repair of the descending thoracic aorta and aortic arch. Status post aortic valve replacement. Stable cardiac enlargement. Persistent moderate to large left pleural effusion appears increased in volume from prior exam. Decreased volume of right pleural effusion. Mild increase interstitial markings. No focal airspace opacities. Remote right posterior rib fractures. IMPRESSION: 1. Persistent moderate to large left pleural effusion appears increased in volume from prior exam. Decreased volume  of right effusion. 2. Mild increase interstitial markings compatible with pulmonary vascular congestion. Electronically Signed   By: Signa Kell M.D.   On: 02/13/2023 06:44   ECHOCARDIOGRAM LIMITED  Result Date: 02/12/2023    ECHOCARDIOGRAM LIMITED REPORT   Patient Name:   TYROME WHITEN Date of Exam: 02/12/2023 Medical Rec #:  [id number]         Height:       70.0 in Accession #:    [id number]        Weight:       160.7 lb Date of Birth:  1941/03/23         BSA:          1.902 m Patient Age:    42 years          BP:           90/48 mmHg Patient Gender: M                 HR:           88 bpm. 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 CAD, Aortic Valve Disease and Mitral Valve                  Disease; Risk Factors:Hypertension.                  Aortic Valve: unknown a valve is present in the aortic                  position. Procedure Date: 04/09/09.  Sonographer:     Aron Baba Referring Phys:  [id number] DAN FLOYD Diagnosing Phys: Armanda Magic MD IMPRESSIONS  1. Left ventricular ejection fraction, by estimation, is 30 to 35%. The left ventricle has moderately decreased function. The left ventricle demonstrates global hypokinesis. The left ventricular internal cavity size was moderately dilated.  2. Right ventricular systolic function is moderately reduced. The right ventricular size is moderately enlarged. There is moderately elevated pulmonary artery systolic pressure. The estimated right ventricular systolic pressure is 56.7 mmHg.  3. Left atrial size was severely dilated.  4. Right atrial size was severely dilated.  5. Moderate pleural effusion in the left lateral region.  6. The mitral valve is degenerative. Moderate mitral valve regurgitation. No evidence of mitral stenosis. Moderate mitral annular calcification.  7. Tricuspid valve regurgitation is moderate.  8. The aortic valve has been repaired/replaced. Aortic valve regurgitation is not visualized. There is a unknown a valve present in the aortic position. Procedure Date: 04/09/09.  9. The inferior vena cava is dilated in size with <50% respiratory variability, suggesting right atrial pressure of 15 mmHg. FINDINGS  Left Ventricle: Left ventricular ejection fraction, by estimation, is 30 to 35%. The left ventricle has moderately decreased function. The left ventricle demonstrates global hypokinesis. The left ventricular internal cavity size was moderately dilated. There is no left ventricular hypertrophy. Right Ventricle: The right  ventricular size is moderately enlarged. No increase in right ventricular wall thickness. Right ventricular systolic function is moderately reduced. There is moderately elevated pulmonary artery systolic pressure. The tricuspid  regurgitant velocity is 3.23 m/s, and with an assumed right atrial pressure of 15 mmHg, the  estimated right ventricular systolic pressure is 56.7 mmHg. Left Atrium: Left atrial size was severely dilated. Right Atrium: Right atrial size was severely dilated. Pericardium: There is no evidence of pericardial effusion. Mitral Valve: The mitral valve is degenerative in appearance. There is mild calcification of the mitral valve leaflet(s). Moderate mitral annular calcification. Moderate mitral valve regurgitation. No evidence of mitral valve stenosis. Tricuspid Valve: The tricuspid valve is normal in structure. Tricuspid valve regurgitation is moderate . No evidence of tricuspid stenosis. Aortic Valve: The aortic valve has been repaired/replaced. Aortic valve regurgitation is not visualized. There is a unknown a valve present in the aortic position. Procedure Date: 04/09/09. Pulmonic Valve: The pulmonic valve was normal in structure. Pulmonic valve regurgitation is not visualized. No evidence of pulmonic stenosis. Aorta: The aortic root is normal in size and structure. Venous: The inferior vena cava is dilated in size with less than 50% respiratory variability, suggesting right atrial pressure of 15 mmHg. IAS/Shunts: No atrial level shunt detected by color flow Doppler. Additional Comments: There is a moderate pleural effusion in the left lateral region. Spectral Doppler performed. Color Doppler performed.  LEFT VENTRICLE PLAX 2D LVIDd:         6.40 cm LVIDs:         4.90 cm LV PW:         1.00 cm LV IVS:        0.90 cm  LV Volumes (MOD) LV vol d, MOD A4C: 156.0 ml LV vol s, MOD A4C: 107.0 ml LV SV MOD A4C:     156.0 ml LEFT ATRIUM         Index LA diam:    6.10 cm 3.21 cm/m  MITRAL VALVE                  TRICUSPID VALVE MV Area (PHT): 8.92 cm      TR Peak grad:   41.7 mmHg MV Decel Time: 85 msec       TR Vmax:        323.00 cm/s MR Peak grad:   82.4 mmHg MR Mean grad:   51.0 mmHg MR Vmax:        454.00 cm/s MR Vmean:       333.0 cm/s MR PISA:        1.57 cm MR PISA Radius: 0.50 cm MV E velocity: 114.00 cm/s MV A velocity: 40.70 cm/s MV E/A ratio:  2.80 Armanda Magic MD Electronically signed by Armanda Magic MD Signature Date/Time: 02/12/2023/12:39:43 PM    Final (Updated)    US Abdomen Limited RUQ (LIVER/GB)  Result Date: 02/11/2023 CLINICAL DATA:  Elevated liver enzymes EXAM: ULTRASOUND ABDOMEN LIMITED RIGHT UPPER QUADRANT COMPARISON:  CT 02/11/2023 noncontrast FINDINGS: Gallbladder: Gallbladder is mildly distended. There is sludge and stones. Gallbladder wall is thickened at 3.5 mm. Mild adjacent ascites. No reported sonographic Murphy's sign Common bile duct: Diameter: 2 mm Liver: No focal lesion identified. Within normal limits in parenchymal echogenicity. Portal vein is patent on color Doppler imaging with normal direction of blood flow towards the liver. Other: None. IMPRESSION: Gallbladder sludge and stones. Mild wall thickening but there is some mild adjacent ascites. No ductal dilatation. If there is further concern of acute cholecystitis, HIDA scan could be considered as clinically appropriate Electronically Signed   By: Karen Kays M.D.   On: 02/11/2023 17:25   MR ANKLE LEFT WO CONTRAST  Addendum Date: 02/11/2023   ADDENDUM REPORT: 02/11/2023 17:04 ADDENDUM: The original report was by Dr.  Gaylyn Rong. The following addendum is by Dr. Gaylyn Rong: This addendum is to correct a typo in the voice recognition transcribed report. The final sentence under the "other" section in the body of the report should read: NO abscess identified. Electronically Signed   By: Gaylyn Rong M.D.   On: 02/11/2023 17:04   Result Date: 02/11/2023 CLINICAL DATA:  Ankle pain, query septic  arthritis/osteomyelitis EXAM: MRI OF THE LEFT ANKLE WITHOUT CONTRAST TECHNIQUE: Multiplanar, multisequence MR imaging of the ankle was performed. No intravenous contrast was administered. COMPARISON:  Radiographs 02/11/2023 FINDINGS: Despite efforts by the technologist and patient, motion artifact is present on today's exam and could not be eliminated. This reduces exam sensitivity and specificity. TENDONS Peroneal: Peroneus brevis tendinopathy posterior to the lateral malleolus. Posteromedial: Torn flexor digitorum longus tendon, only well appreciated distal to the knot of Henry. Anterior: Expanded tibialis anterior tendon compatible tendinopathy. Trace metal artifact along the distal tendon, query prior surgical reattachment. Achilles: Fusiform Achilles contour with accentuated distal signal compatible with moderate distal Achilles tendinopathy. Plantar Fascia: Mild thickening of the medial band of the plantar fascia, cannot exclude mild plantar fasciitis. Plantar calcaneal spur noted. LIGAMENTS Lateral: Attenuated appearance of the anterior talofibular ligament suggesting a remote injury. Medial: Grossly unremarkable CARTILAGE Ankle Joint: Moderate tibiotalar joint effusion. Mild endosteal edema along the tibiotalar joint Subtalar Joints/Sinus Tarsi: Unremarkable Bones: Degenerative findings at the Lisfranc joint. Other: Subcutaneous edema along the anterior ankle and along the plantar and dorsal foot. Abscess identified. IMPRESSION: 1. Moderate tibiotalar joint effusion with mild endosteal edema along the tibiotalar joint. This is nonspecific and could be degenerative or inflammatory, but septic arthritis is not difficult to totally exclude. There less synovitis than I would typically expect in septic arthritis. Correlate clinically in determining whether arthrocentesis is indicated. 2. Torn flexor digitorum longus tendon, only well appreciated distal to the knot of Henry. 3. Expanded tibialis anterior tendon  compatible with tendinopathy. Trace metal artifact along the distal tendon, query prior surgical reattachment. 4. Moderate distal Achilles tendinopathy. 5. Mild thickening of the medial band of the plantar fascia, cannot exclude mild plantar fasciitis. 6. Peroneus brevis tendinopathy posterior to the lateral malleolus. 7. Attenuated appearance of the anterior talofibular ligament suggesting a remote injury. 8. Subcutaneous edema along the anterior ankle and along the plantar and dorsal foot. No abscess identified. Electronically Signed: By: Gaylyn Rong M.D. On: 02/11/2023 15:34   CT CHEST ABDOMEN PELVIS WO CONTRAST  Result Date: 02/11/2023 CLINICAL DATA:  Sepsis. EXAM: CT CHEST, ABDOMEN AND PELVIS WITHOUT CONTRAST TECHNIQUE: Multidetector CT imaging of the chest, abdomen and pelvis was performed following the standard protocol without IV contrast. RADIATION DOSE REDUCTION: This exam was performed according to the departmental dose-optimization program which includes automated exposure control, adjustment of the mA and/or kV according to patient size and/or use of iterative reconstruction technique. COMPARISON:  Chest CT on 11/28/2022, and AP CT on 09/20/2022 FINDINGS: CT CHEST FINDINGS Cardiovascular: Stable appearance of aortic valve replacement, tube graft repair of ascending aorta, and endovascular stent graft of the aortic arch and descending thoracic aorta. Stable moderate cardiomegaly. Mediastinum/Lymph Nodes: No masses or pathologically enlarged lymph nodes identified on this unenhanced exam. Lungs/Pleura: Stable small to moderate bilateral pleural effusions and bilateral lower lobe atelectasis. Resolution of previously seen airspace disease in right upper and middle lobes since prior study. No new or increased areas of pulmonary opacity are seen. No No evidence of central endobronchial obstruction. Stable 4 mm pulmonary nodule in anterior right upper  lobe on image 58/5. Musculoskeletal: No  suspicious bone lesions identified. Multiple old right posterior rib fracture deformities are noted. 1 CT ABDOMEN AND PELVIS FINDINGS Hepatobiliary: No masses visualized on this unenhanced exam. Cholelithiasis. No radiographic evidence of cholecystitis. Pancreas: No mass or inflammatory changes identified on this unenhanced exam. Spleen:  Within normal limits in size. Adrenals/Urinary Tract: No evidence of renal or ureteral calculi, or hydronephrosis. Minimal radiodensity is seen along the left posterior bladder wall, suspicious for tiny bladder calculi. Stomach/Bowel: No evidence of obstruction, inflammatory process, or abnormal fluid collections. A large left inguinal hernia is seen which contains sigmoid colon. This is unchanged, and there is no evidence of bowel obstruction or strangulation. Diverticulosis is seen mainly involving the descending and sigmoid colon, however there is no evidence of diverticulitis. Normal appendix visualized. Vascular/Lymphatic: No pathologically enlarged lymph nodes identified. No abdominal aortic aneurysm. Reproductive:  Mildly enlarged prostate. Other:  None. Musculoskeletal: No suspicious bone lesions identified. Mild superior endplate compression fracture of the L1 vertebral body is seen which is new since prior exam. IMPRESSION: Stable small to moderate bilateral pleural effusions and bilateral lower lobe atelectasis. Resolution of previously seen airspace disease in right upper and middle lobes. No new or increased areas of pulmonary opacity. Stable 4 mm right upper lobe pulmonary nodule. No follow-up needed if patient is low-risk. Non-contrast chest CT can be considered in 12 months if patient is high-risk. This recommendation follows the consensus statement: Guidelines for Management of Incidental Pulmonary Nodules Detected on CT Images: From the Fleischner Society 2017; Radiology 2017; 284:228-243. Cholelithiasis. No radiographic evidence of cholecystitis. Large left  inguinal hernia containing sigmoid colon. No evidence of bowel obstruction or strangulation. Colonic diverticulosis, without radiographic evidence of diverticulitis. Mildly enlarged prostate. Probable tiny bladder calculi. No evidence of ureteral calculi or hydronephrosis. Mild L1 vertebral body compression fracture, new since prior study. Electronically Signed   By: Danae Orleans M.D.   On: 02/11/2023 15:15   DG Ankle Complete Left  Result Date: 02/11/2023 CLINICAL DATA:  Wound infection. EXAM: LEFT ANKLE COMPLETE - 3+ VIEW COMPARISON:  Left foot radiographs 11/20/2022 at [facility] FINDINGS: Soft tissue swelling is slightly more prominent medial than lateral. No acute or focal osseous abnormalities are present. A moderate-sized joint effusion is present. Mild osteopenia is noted. Plantar calcaneal spur is again seen. IMPRESSION: 1. Soft tissue swelling and moderate-sized joint effusion without acute or focal osseous abnormality. 2. Plantar calcaneal spur. 3. Mild osteopenia. Electronically Signed   By: Marin Roberts M.D.   On: 02/11/2023 08:49   DG Chest Port 1 View  Result Date: 02/11/2023 CLINICAL DATA:  Left ankle wound infection.  Question sepsis. EXAM: PORTABLE CHEST 1 VIEW COMPARISON:  Two-view chest x-ray 12/22/2022 FINDINGS: Heart is enlarged. Aortic stent grafting noted. Left greater than right pleural effusions are again seen. Mild pulmonary vascular congestion is noted. Bibasilar airspace opacities are present, left greater than right. Atherosclerotic calcifications are noted within the distal subclavian and axillary arteries. Degenerative changes are present in the shoulders, right greater than left. IMPRESSION: 1. Cardiomegaly and mild pulmonary vascular congestion without frank edema. 2. Left greater than right pleural effusions and bibasilar airspace disease, left greater than right. While this may represent atelectasis, infection is not excluded. Electronically Signed   By:  Marin Roberts M.D.   On: 02/11/2023 08:34    I have personally spent 55  minutes involved in face-to-face and non-face-to-face activities for this patient on the day of the visit. Professional time spent  includes the following activities: Preparing to see the patient (review of tests), Obtaining and/or reviewing separately obtained history (admission/discharge record), Performing a medically appropriate examination and/or evaluation , Ordering medications/tests/procedures, referring and communicating with other health care professionals, Documenting clinical information in the EMR, Independently interpreting results (not separately reported), Communicating results to the patient/family/caregiver, Counseling and educating the patient/family/caregiver and Care coordination (not separately reported).   Plan d/w requesting provider as well as ID pharm D  Note: This document was prepared using dragon voice recognition software and may include unintentional dictation errors.   Electronically signed by:   Odette Fraction, MD Infectious Disease Physician [facility] Pager: [phone]

## 2023-02-13 NOTE — TOC Initial Note (Signed)
Transition of Care [facility]) - Initial/Assessment Note    Patient Details  Name: [name] [name] MRN: [id number] Date of Birth: 1940/10/15  [facility]) CM/SW Contact:    Carley Hammed, LCSW Phone Number: 02/13/2023, 2:27 PM  Clinical Narrative:                 CSW met with pt and spouse at bedside to discuss SNF rec. Pt was lethargic and did not participate in conversation. Per spouse, pt is from home with family assistance. He can usually stand and use a walker. Spouse states she would prefer him home if at all possible. They have used [facility] in the past and spouse has already contacted them. Pt was in CIR several months ago, and the most recent therapy not noted defecits to functional abilities.  Per spouse, pt had just gotten out of a procedure and has not eaten, she states that she would like him to be evaluated again at a more appropriate time to establish his baseline more accurately. Spouse states that if SNF is necessary, she would consider either [facility] facility. TOC will complete FL2 and continue to follow for disposition choice.   Expected Discharge Plan:  (TBD) Barriers to Discharge: Continued Medical Work up (Disposition TBD)   Patient Goals and CMS Choice Patient states their goals for this hospitalization and ongoing recovery are:: Pt unable to participate in goal setting. CMS Medicare.gov Compare Post Acute Care list provided to:: Patient Represenative (must comment) Choice offered to / list presented to : Spouse      Expected Discharge Plan and Services In-house Referral: Clinical Social Work     Living arrangements for the past 2 months: Single Family Home                                      Prior Living Arrangements/Services Living arrangements for the past 2 months: Single Family Home Lives with:: Spouse Patient language and need for interpreter reviewed:: Yes Do you feel safe going back to the place where you live?: Yes      Need  for Family Participation in Patient Care: Yes (Comment) Care giver support system in place?: Yes (comment)   Criminal Activity/Legal Involvement Pertinent to Current Situation/Hospitalization: No - Comment as needed  Activities of Daily Living Home Assistive Devices/Equipment: Eyeglasses, Hearing aid, Walker (specify type) ADL Screening (condition at time of admission) Patient's cognitive ability adequate to safely complete daily activities?: Yes Is the patient deaf or have difficulty hearing?: Yes Does the patient have difficulty seeing, even when wearing glasses/contacts?: Yes Does the patient have difficulty concentrating, remembering, or making decisions?: No Patient able to express need for assistance with ADLs?: Yes Does the patient have difficulty dressing or bathing?: Yes Independently performs ADLs?: No Does the patient have difficulty walking or climbing stairs?: Yes Weakness of Legs: Both Weakness of Arms/Hands: None  Permission Sought/Granted Permission sought to share information with : Family Supports, Oceanographer granted to share information with : Yes, Verbal Permission Granted  Share Information with NAME: [name]  Permission granted to share info w AGENCY: [facility]  Permission granted to share info w Relationship: Spouse     Emotional Assessment Appearance:: Appears stated age Attitude/Demeanor/Rapport: Unable to Assess Affect (typically observed): Unable to Assess Orientation: : Oriented to Self, Oriented to Place, Oriented to  Time, Oriented to Situation Alcohol / Substance Use: Not  Applicable Psych Involvement: No (comment)  Admission diagnosis:  Altered mental state [R41.82] Failure to thrive in adult [R62.7] AKI (acute kidney injury) (HCC) [N17.9] Patient Active Problem List   Diagnosis Date Noted   Acute hypoxic respiratory failure (HCC) 02/13/2023   Septic arthritis of left ankle (HCC) 02/12/2023   Altered mental state  02/11/2023   Hematuria 01/12/2023   Sepsis due to methicillin resistant Staphylococcus aureus (MRSA) (HCC) 12/22/2022   Long term current use of antibiotics 12/22/2022   Debility 12/15/2022   Pneumonia 12/08/2022   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) 11/30/2022   Acute metabolic encephalopathy 11/30/2022   AKI (acute kidney injury) (HCC) 11/30/2022   Loculated pleural effusion 11/28/2022   MRSA bacteremia 11/28/2022   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 12/18/2019 12/17/2019   Incarcerated left inguinal hernia s/p repair 12/02/2019 12/01/2019   Pressure injury of skin 10/06/2019   Chronic diastolic heart failure (HCC) 01/28/2015   Cavitary pneumonia 10/11/2013   Acute respiratory failure with hypoxia (HCC) 10/07/2013   Recurrent UTI 10/06/2013   Insomnia 09/04/2013   Bradycardia 08/23/2013   Atrial fibrillation (HCC) 05/21/2013   Atrial flutter (HCC)    Chronic combined systolic and diastolic CHF (congestive heart failure) (HCC) 05/11/2013   Pleural effusion 05/11/2013   Aortic atherosclerosis (HCC) 05/05/2013   Normocytic anemia 05/04/2013   CAD (coronary artery disease)    S/P AVR    Hyperlipidemia    HTN (hypertension)    PCP:  Street, [name], MD Pharmacy:   [facility] [address], [place] - [address] [facility] [place] [address] Phone: [phone] Fax: [phone]  [facility] [address] [place] [address] Phone: [phone] Fax: [phone]     Social Determinants of Health (SDOH) Social History: SDOH Screenings   Food Insecurity: No Food Insecurity (02/11/2023)  Housing: Patient Unable To Answer (02/11/2023)  Transportation Needs: No Transportation Needs (02/11/2023)  Utilities: Not At Risk (02/11/2023)  Depression (PHQ2-9): Low Risk  (04/13/2019)  Tobacco Use: Medium Risk  (02/12/2023)   SDOH Interventions:     Readmission Risk Interventions     No data to display

## 2023-02-13 NOTE — Progress Notes (Incomplete)
Podiatry - Dr. Marylene Land, call for cultures

## 2023-02-13 NOTE — NC FL2 (Signed)
[place] MEDICAID FL2 LEVEL OF CARE FORM     IDENTIFICATION  Patient Name: [name] [name]: 1941-02-18 Sex: male Admission Date (Current Location): 02/11/2023  [facility] and [place] Number:  Producer, television/film/video and Address:  [facility], [address], [place], [place] [address]      Provider Number: [id number]  Attending Physician Name and Address:  Inez Catalina, MD  Relative Name and Phone Number:       Current Level of Care: Hospital Recommended Level of Care: Skilled Nursing Facility Prior Approval Number:    Date Approved/Denied:   PASRR Number: [id number]  Discharge Plan: SNF    Current Diagnoses: Patient Active Problem List   Diagnosis Date Noted   Acute hypoxic respiratory failure (HCC) 02/13/2023   Septic arthritis of left ankle (HCC) 02/12/2023   Altered mental state 02/11/2023   Hematuria 01/12/2023   Sepsis due to methicillin resistant Staphylococcus aureus (MRSA) (HCC) 12/22/2022   Long term current use of antibiotics 12/22/2022   Debility 12/15/2022   Pneumonia 12/08/2022   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) 11/30/2022   Acute metabolic encephalopathy 11/30/2022   AKI (acute kidney injury) (HCC) 11/30/2022   Loculated pleural effusion 11/28/2022   MRSA bacteremia 11/28/2022   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 12/18/2019 12/17/2019   Incarcerated left inguinal hernia s/p repair 12/02/2019 12/01/2019   Pressure injury of skin 10/06/2019   Chronic diastolic heart failure (HCC) 01/28/2015   Cavitary pneumonia 10/11/2013   Acute respiratory failure with hypoxia (HCC) 10/07/2013   Recurrent UTI 10/06/2013   Insomnia 09/04/2013   Bradycardia 08/23/2013   Atrial fibrillation (HCC) 05/21/2013   Atrial flutter (HCC)    Chronic combined systolic and diastolic CHF (congestive heart failure) (HCC)  05/11/2013   Pleural effusion 05/11/2013   Aortic atherosclerosis (HCC) 05/05/2013   Normocytic anemia 05/04/2013   CAD (coronary artery disease)    S/P AVR    Hyperlipidemia    HTN (hypertension)     Orientation RESPIRATION BLADDER Height & Weight     Self, Time, Situation, Place  Normal Incontinent Weight: 160 lb 11.5 oz (72.9 kg) Height:  5\' 10"  (177.8 cm)  BEHAVIORAL SYMPTOMS/MOOD NEUROLOGICAL BOWEL NUTRITION STATUS      Continent Diet (See DC summary)  AMBULATORY STATUS COMMUNICATION OF NEEDS Skin   Extensive Assist Verbally Surgical wounds (L ankle incision)                       Personal Care Assistance Level of Assistance  Bathing, Feeding, Dressing Bathing Assistance: Maximum assistance Feeding assistance: Maximum assistance Dressing Assistance: Maximum assistance     Functional Limitations Info  Sight, Hearing, Speech Sight Info: Adequate Hearing Info: Adequate Speech Info: Adequate    SPECIAL CARE FACTORS FREQUENCY  PT (By licensed PT), OT (By licensed OT)     PT Frequency: 5x week OT Frequency: 5x week            Contractures Contractures Info: Not present    Additional Factors Info  Code Status, Allergies Code Status Info: DNR Allergies Info: Lortab (Hydrocodone-acetaminophen)  Morphine And Codeine  Surgical staples           Current Medications (02/13/2023):  This is the current hospital active medication list Current Facility-Administered Medications  Medication Dose Route Frequency Provider Last Rate Last Admin   acetaminophen (TYLENOL) tablet 500  mg  500 mg Oral Q6H PRN West Bali, PA-C       amiodarone (PACERONE) tablet 200 mg  200 mg Oral Daily West Bali, PA-C   200 mg at 02/13/23 8295   apixaban (ELIQUIS) tablet 2.5 mg  2.5 mg Oral BID West Bali, PA-C   2.5 mg at 02/13/23 6213   aspirin chewable tablet 81 mg  81 mg Oral Daily West Bali, PA-C   81 mg at 02/13/23 0865   ceFEPIme (MAXIPIME) 2 g in sodium  chloride 0.9 % 100 mL IVPB  2 g Intravenous Q24H West Bali, PA-C 200 mL/hr at 02/13/23 7846 2 g at 02/13/23 0907   [START ON 02/14/2023] DAPTOmycin (CUBICIN) 600 mg in sodium chloride 0.9 % IVPB  8 mg/kg Intravenous Q48H Odette Fraction, MD       Melene Muller ON 02/14/2023] doxycycline (VIBRA-TABS) tablet 100 mg  100 mg Oral Q12H Odette Fraction, MD       feeding supplement (BOOST / RESOURCE BREEZE) liquid 1 Container  1 Container Oral TID BM Tomie China, MD   1 Container at 02/13/23 0908   Gerhardt's butt cream   Topical BID West Bali, PA-C   Given at 02/13/23 0908   megestrol (MEGACE) tablet 40 mg  40 mg Oral BID West Bali, PA-C   40 mg at 02/13/23 0908   metroNIDAZOLE (FLAGYL) IVPB 500 mg  500 mg Intravenous Q12H West Bali, PA-C 100 mL/hr at 02/13/23 1049 500 mg at 02/13/23 1049   midodrine (PROAMATINE) tablet 10 mg  10 mg Oral TID WC McClung, Sarah A, PA-C   10 mg at 02/13/23 1139   pantoprazole (PROTONIX) EC tablet 40 mg  40 mg Oral Daily West Bali, PA-C   40 mg at 02/13/23 9629   senna (SENOKOT) tablet 8.6 mg  1 tablet Oral BID West Bali, PA-C   8.6 mg at 02/13/23 0908   traMADol (ULTRAM) tablet 50 mg  50 mg Oral Q6H PRN West Bali, PA-C   50 mg at 02/11/23 1653   trimethobenzamide (TIGAN) injection 200 mg  200 mg Intramuscular Q8H PRN West Bali, PA-C   200 mg at 02/13/23 1137     Discharge Medications: Please see discharge summary for a list of discharge medications.  Relevant Imaging Results:  Relevant Lab Results:   Additional Information SS# [id number] [place]

## 2023-02-13 NOTE — Anesthesia Postprocedure Evaluation (Signed)
Anesthesia Post Note  Patient: [name] [name]  Procedure(s) Performed: IRRIGATION AND DEBRIDEMENT, ANKLE (Left: Ankle)     Patient location during evaluation: Nursing Unit Anesthesia Type: MAC Level of consciousness: awake and patient cooperative Pain management: pain level controlled Vital Signs Assessment: post-procedure vital signs reviewed and stable Respiratory status: spontaneous breathing, nonlabored ventilation and respiratory function stable Cardiovascular status: stable and blood pressure returned to baseline Postop Assessment: no apparent nausea or vomiting Anesthetic complications: no   No notable events documented.  Last Vitals:  Vitals:   02/13/23 0535 02/13/23 0735  BP: (!) 106/51 (!) 111/47  Pulse: 95 85  Resp:  16  Temp:  (!) 36.4 C  SpO2:  96%    Last Pain:  Vitals:   02/13/23 0735  TempSrc: Oral  PainSc: 0-No pain                 Ardeth Repetto

## 2023-02-13 NOTE — Progress Notes (Addendum)
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 intake nausea and vomiting and was admitted for suspected bacteremia.  He is now on hospital day 2.  @2325 : MD assessed volume status: within normal limits. Reduced UOP, bladder scan ordered. @300 : RN reported tachypnea and shortness of breath overnight. On MD re-evaluation @303 : RR 34. O2 sat 94% on 2L [place]. Denied chest pain/shortness of breath.  Lungs CTAB, JVD present. Received 800 mL LR. Stat CXR taken, showed greater L pleural effusion than previous CXR and interstitial markings consistent with volume overload.  On interview, patient is arousable to voice and follows commands, however appears very weak.  Is oriented to self, place, situation but not to year.  He does not respond to those questions.  Denies pain, shortness of breath, and urinary symptoms. Nodded when asked if he is eating and taking his boost.  Objective:  Pressures somewhat improved this morning: BP: 106/51. HR: 95. Satting 96% on RA. Afebrile.   Vital signs in last 24 hours: Vitals:   02/13/23 0525 02/13/23 0535 02/13/23 0735 02/13/23 1123  BP:  (!) 106/51 (!) 111/47 110/61  Pulse: 100 95 85 88  Resp:   16 18  Temp:   (!) 97.5 F (36.4 C) (!) 97.4 F (36.3 C)  TempSrc:   Oral Axillary  SpO2: 96%  96% 95%  Weight:      Height:       Physical exam:   Constitutional: Chronically ill-appearing man laying in bed, no acute distress Cardiovascular: Regular rhythm, regular rate. +JVD. Respiratory: No increased work of breathing, reduced lung sounds at the left lung base. Abdominal: Bowel sounds present, no tenderness of 4 quadrants MSK: Lateral lower extremities with multiple sores, as before.  In SCDs.  Bandage over her left ankle joint and right toe. Neuro:  Neuro exam deferred, grossly intact. Psych: Oriented to self, place, but not to year.  Otherwise alert, if lethargic  Pertinent labs (emphasis mine):  K: 5.2 (5.4) BUN: 69 (62) Creatinine 2.51 (2.19)  Corrected calcium: 9.4(9.9) AST: 222 (243)  ALT: 191 (169)  Alkaline phosphatase: 181 (164)  Bilirubin 1.0 (0.9)    Hemoglobin: 8.5 (7.9)  Platelets: 180 (197)   Blood cultures: No growth at 12 hours Body fluid cultures: No growth at 24 hours   Pertinent imaging (emphasis mine):   XR Chest (8/26):   IMPRESSION: 1. Cardiomegaly and mild pulmonary vascular congestion without frank edema. 2. Left greater than right pleural effusions and bibasilar airspace disease, left greater than right. While this may represent atelectasis, infection is not excluded.  Stat XR Chest (AM 8/27):  IMPRESSION: 1. Persistent moderate to large left pleural effusion appears increased in volume from prior exam. Decreased volume of right effusion. 2. Mild increase interstitial markings compatible with pulmonary vascular congestion.  Assessment/Plan:  Principal Problem:   Altered mental state Active Problems:   Normocytic anemia   CAD (coronary artery disease)   S/P AVR   Hyperlipidemia   Pleural effusion   Atrial fibrillation (HCC)   Chronic diastolic heart failure (HCC)   Septic arthritis of left ankle (HCC)   Acute hypoxic respiratory failure (HCC)  #Concern for bacteremia of unknown etiology #Hypotension #History of MRSA bacteremia RN reported SOB, requiring 2L [place].  Patient resting comfortably on MD overnight eval. This morning, patient appears unchanged from  yesterday.  Now satting well and appears comfortable on room air.  BPs slightly improved from yesterday: 111/47. Continues to be lethargic and minimally responsive to questions.  No blood cultures have resulted yet.  We will tailor antibiotics when cultures result. - Hold continued IVF at this time in setting of hypervolemia.  -  ID recs as below: - Discontinue IV vancomycin 1 g every 36 hours (8/25 - 8/27). - Begin doxycycline 100 mg every 12 hours for pulmonary coverage (8/27 -) - Begin daptomycin 600 mg every 48 hours (8/27 -) - Continue IV cefepime 2 g daily (8/25 - ). - Continue IV Flagyl 500 mg every 12 hours (8/25 -). - Await blood/aspiration/surgical cultures.  #Pleural effusions, worsening #CHF (30-35%), worsening #AKI, worsening #Elevated LFTs, worsening #Concern for Multi-system organ failure Reported reduced urine output overnight. Bladder scan ordered.  I have also ordered condom cath in order to more accurately track urine output.  BUN 69 (62) and Cr 2.51 (2.19) after IV fluid resuscitation -- indicating worsening renal status. Indeterminate LFT change overnight: AST 222 (243,) ALT increasd to 191 (169). Alk phos also up 181 (164). Concerning for worsening multi-system organ failure.  - Ordered albumin 15 g x1 and Lasix 40 mg x 1 to increase intravascular volume/perfusion. Repeat CMP @2000  8/27. If renal function has not improved by that time, will give Lasix 80 mg x1. - Continue home midodrine 10 mg 3 times daily to increase perfusion. - Follow up with morning labs 8/28.  #Goals of care - Patient was made DNR/DNI on previous hospital admission. Status failing to improve: patient looks very sick. Will discuss further options with family this afternoon.    #PAD complicated by multiple chronic wounds (bilateral lower extremity, sacral) #Joint effusion of the left ankle #Stage 2 sacral decubitus ulcer - Await cultures from washout and joint aspiration  Wound care recs as below: - Cleanse wounds to left and right feet with soap and water and pat dry. - Paint wounds with betadine and dry gauze, secure with kerlix and tape.  Change daily.  - Cleanse sacral wound with NS and pat dry. Apply VASHE (LAWSON # [id number]) moist gauze to wound bed and secure with foam dressing. Change VASHE dressing daily and replace  foam every three days and PRN soilage.     #Failure to thrive #Lethargy #Nausea/vomiting Speech swallow evaluation 8/26, recommended continuing regular diet with thin liquids. PT/OT saw yesterday: Recommended SNF placement.  Patient endorses better p.o. intake today. - Continue regular diet, per speech eval.  - Began Boost breeze supplementation. - Will discharge to SNF.  #History of aortic valve replacement #History of mitral insufficiency - TTE 8/26 showed no indication of valvular vegetative seeding or other infectious etiology, ruling this out as source of current bacteremia.   #Hyperkalemia, resolved - Follow up with morning labs 8/27th   #Atrial fibrillation #QTc prolongation Currently in Afib. Not in RPR. Qtc: 541. Avoid giving further Zofran and other QTc prolonging medications. - Continue home Eliquis 2.5 mg twice daily.  #Macrocytic anemia - In setting of poor PO. Folate and B12 levels within normal limits. Can be seen in setting of nucleated red blood cells.   Prior to Admission Living Arrangement: Home Anticipated Discharge Location: SNF Barriers to Discharge: Acute illness Dispo: Anticipated discharge in approximately 3-4 day(s).   Tomie China, MD 02/13/2023, 11:39 AM Pager: [phone] After 5pm on weekdays and 1pm on weekends: On Call pager [phone]

## 2023-02-13 NOTE — Progress Notes (Signed)
Orthopaedic Trauma Progress Note  SUBJECTIVE: Doing fair this AM.  Sleeping comfortably this morning, is arousable. Noted to have some tachypnea and SOB overnight.  Pain currently controlled.  No other specific concerns currently.  Intraoperative cultures from left ankle pending. Wife at bedside  OBJECTIVE:  Vitals:   02/13/23 0535 02/13/23 0735  BP: (!) 106/51 (!) 111/47  Pulse: 95 85  Resp:  16  Temp:  (!) 97.5 F (36.4 C)  SpO2:  96%    General: Sitting up in bed, arousable.  Following commands. Respiratory: No increased work of breathing.  Left lower extremity: Dressing clean, dry, intact. Tolerates gentle passive ankle motion. Decreased sensation to the lower leg and foot at baseline.   LABS:  Results for orders placed or performed during the hospital encounter of 02/11/23 (from the past 24 hour(s))  Glucose, capillary     Status: None   Collection Time: 02/12/23  8:45 AM  Result Value Ref Range   Glucose-Capillary 95 70 - 99 mg/dL  Glucose, capillary     Status: None   Collection Time: 02/12/23 11:34 AM  Result Value Ref Range   Glucose-Capillary 92 70 - 99 mg/dL  Glucose, capillary     Status: Abnormal   Collection Time: 02/12/23 10:11 PM  Result Value Ref Range   Glucose-Capillary 104 (H) 70 - 99 mg/dL  Glucose, capillary     Status: Abnormal   Collection Time: 02/13/23  5:31 AM  Result Value Ref Range   Glucose-Capillary 109 (H) 70 - 99 mg/dL  CBC     Status: Abnormal   Collection Time: 02/13/23  7:25 AM  Result Value Ref Range   WBC 10.1 4.0 - 10.5 K/uL   RBC 2.69 (L) 4.22 - 5.81 MIL/uL   Hemoglobin 8.5 (L) 13.0 - 17.0 g/dL   HCT 86.5 (L) 78.4 - 69.6 %   MCV 110.4 (H) 80.0 - 100.0 fL   MCH 31.6 26.0 - 34.0 pg   MCHC 28.6 (L) 30.0 - 36.0 g/dL   RDW 29.5 (H) 28.4 - 13.2 %   Platelets 180 150 - 400 K/uL   nRBC 0.4 (H) 0.0 - 0.2 %    ASSESSMENT: ARTY GRADILLAS is a 82 y.o. male, 1 Day Post-Op s/p IRRIGATION AND DEBRIDEMENT LEFT ANKLE  CV/Blood loss:  Hemoglobin 8.5 this morning, stable and trending upward from preop.    PLAN: Weightbearing: WBAT LLE ROM: Unrestricted ROM Incisional and dressing care: Reinforce dressings as needed  Showering: Okay to begin getting incision wet LLE on 02/15/2023 Orthopedic device(s): None  Pain management: Continue current multimodal regimen, limit narcotics as able VTE prophylaxis:  Eliquis , SCDs ID: Per primary team and infectious disease Foley/Lines:  No foley, KVO IVFs  Dispo: PT/OT evaluations ongoing.  Follow up intra-op cultures  Follow - up plan: 2 weeks after discharge for wound check, suture removal   Contact information:  Truitt Merle MD, Thyra Breed PA-C. After hours and holidays please check [email or web address] for group call information for [facility]   Thompson Caul, PA-C [phone] (office) [email or web address]

## 2023-02-13 NOTE — Progress Notes (Signed)
Examined patient at bedside after RN notified us that he was tachypneic with complaints of shortness of breath.  Vitals: RR 34, BP 86/63, MAP 72, Hr 90  At bedside, on 2LNC with O2 saturation of 94%. Patient denied chest painand shortness of breath when we arrived. Physical exam findings: regular rate and rhythm, no murmurs, JVD, lungs were clear to auscultate bilaterally, no increased work of breathing, no longer tachypneic. No wheezes or rales auscultated. Mentating at baseline and extremities were warm and dry.   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   If the MAP<65, and he has evidence of pulmonary edema, will consult cariology.      Vitals:   02/13/23 0235 02/13/23 0238  BP: (!) 83/61 (!) 86/63  Pulse: 91 90  Resp: (!) 38 (!) 34  Temp:  (!) 97.2 F (36.2 C)  SpO2: 94%

## 2023-02-13 NOTE — Progress Notes (Signed)
Attempted to give patient hs medications.  Patient noted to have difficulty swallowing and holding pill in mouth.  Encouraged patient to swallow multiple times.  Patient just holds pill in mouth.  Patient was able to take a small sip of water, no coughing noted.

## 2023-02-13 NOTE — Consult Note (Signed)
   [facility] Inpatient Consult   02/13/2023  ROLLYN HOWERY 04/10/1941 [id number]  [facility] [[facility]]  Accountable Care Organization [ACO] Patient: Medicare ACO REACH insurance  Primary Care Provider:  Street, [name], MD with [facility] is listed to provide the transition of care follow up calls and appointments  Patient screened for 3 hospitalizations in the past 6 months with noted medium high risk score for unplanned readmission risk and  to assess for potential [facility]  [[facility] Care Management service needs for post hospital transition for care coordination.  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   11:20 am  Patient asleep on rounds and staff member in room working at the bedside. Reviewed Palliative consult and PT/OT evals for potential post hospital needs.   Plan:  Continue to follow progress and disposition to assess for post hospital community care coordination/management needs.  Referral request for community care coordination: pending disposition needs    Of note, [facility] Care Management/Population Health does not replace or interfere with any arrangements made by the Inpatient Transition of Care team.  For questions contact:   Charlesetta Shanks, RN BSN CCM [facility]  [phone] business mobile phone Toll free office [phone]  Fax number: [phone] [email or web address]

## 2023-02-13 NOTE — Anesthesia Preprocedure Evaluation (Signed)
Anesthesia Evaluation  Patient identified by MRN, date of birth, ID band Patient confused  General Assessment Comment:A+Ox1  Reviewed: Allergy & Precautions, NPO status , Patient's Chart, lab work & pertinent test results  History of Anesthesia Complications Negative for: history of anesthetic complications  Airway Mallampati: Unable to assess       Dental  (+) Dental Advisory Given   Pulmonary shortness of breath, pneumonia, former smoker    + decreased breath sounds      Cardiovascular hypertension, Pt. on medications + CAD and +CHF  + dysrhythmias + Valvular Problems/Murmurs  Rhythm:Regular  1. Left ventricular ejection fraction, by estimation, is 35 to 40%. The  left ventricle has moderately decreased function. The left ventricle  demonstrates global hypokinesis. There is mild asymmetric left ventricular  hypertrophy of the septal segment.  Left ventricular diastolic parameters are consistent with Grade III  diastolic dysfunction (restrictive).   2. Right ventricular systolic function is moderately reduced. The right  ventricular size is moderately enlarged. There is moderately elevated  pulmonary artery systolic pressure. The estimated right ventricular  systolic pressure is 56.5 mmHg.   3. Left atrial size was severely dilated.   4. Right atrial size was severely dilated.   5. The mitral valve is abnormal. Moderate mitral valve regurgitation.  Moderate mitral annular calcification.   6. The tricuspid valve is abnormal. Tricuspid valve regurgitation is  moderate.   7. 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 The aortic  valve has been repaired/replaced. Aortic valve regurgitation is not  visualized. 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 Procedure Date: 2010. Echo  findings are consistent with normal structure and function of the aortic  valve prosthesis. Aortic valve mean  gradient measures 9.5 mmHg.   8. The pulmonic valve was abnormal.   9. The inferior vena cava is dilated in size with <50% respiratory  variability, suggesting right atrial pressure of 15 mmHg.     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  Widely patent left main.  Widely patent LAD with moderate luminal irregularities proximal to distal.  Widely patent stent in the proximal to mid circumflex with up to 50% narrowing distal to the stent.  Moderate pulmonary hypertension.  Mean pulmonary artery pressure 35 mmHg.  Mean pulmonary capillary wedge pressure 32 mmHg.   RECOMMENDATIONS:    Left ventricular systolic dysfunction is not on the basis of coronary disease.  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    Neuro/Psych    GI/Hepatic Neg liver ROS,,,  Endo/Other    Renal/GU Renal InsufficiencyRenal disease     Musculoskeletal  (+) Arthritis ,    Abdominal   Peds  Hematology  (+) Blood dyscrasia, anemia   Anesthesia Other Findings   Reproductive/Obstetrics                              Anesthesia Physical Anesthesia Plan  ASA: 4  Anesthesia Plan: MAC   Post-op Pain Management: Minimal or no pain anticipated   Induction:   PONV Risk Score and Plan: 1 and Treatment may vary due to age or medical condition  Airway Management Planned: Simple Face Mask, Natural Airway and Nasal Cannula  Additional Equipment: None  Intra-op Plan:   Post-operative Plan:   Informed Consent: I have reviewed the patients History and Physical, chart, labs and discussed the procedure including the risks,  benefits and alternatives for the proposed anesthesia with the patient or authorized representative who has indicated his/her understanding and acceptance.    Discussed DNR with patient and Continue DNR.   Dental advisory given and Consent reviewed with POA  Plan Discussed with: CRNA  Anesthesia  Plan Comments:          Anesthesia Quick Evaluation

## 2023-02-13 NOTE — Plan of Care (Signed)

## 2023-02-13 NOTE — Progress Notes (Addendum)
Pharmacy Antibiotic Note  [name] [name] is a 82 y.o. male admitted on 02/11/2023 with  a wound infection .  Pharmacy has been consulted for daptomycin and doxycycline dosing.  Plan: Start daptomycin 650 mg (8 mg/kg) q48h (renally dose adj) Start doxycycline 100 mg PO BID Follow up renal function, culture data, and clinical status  Height: 5\' 10"  (177.8 cm) Weight: 72.9 kg (160 lb 11.5 oz) IBW/kg (Calculated) : 73  Temp (24hrs), Avg:97.5 F (36.4 C), Min:97 F (36.1 C), Max:98 F (36.7 C)  Recent Labs  Lab 02/11/23 0757 02/11/23 0758 02/11/23 0917 02/12/23 0330 02/13/23 0725  WBC 8.8  --   --  8.5 10.1  CREATININE 2.17*  --   --  2.19* 2.51*  LATICACIDVEN  --  2.9* 2.9*  --   --     Estimated Creatinine Clearance: 23 mL/min (A) (by C-G formula based on SCr of 2.51 mg/dL (H)).    Allergies  Allergen Reactions   Lortab [Hydrocodone-Acetaminophen] Hives    No trouble breathing   Morphine And Codeine Hives   Other Other (See Comments)    Staples from surgery caused infection    Antimicrobials this admission: *** *** >> *** *** *** >> ***  Dose adjustments this admission: ***  Microbiology results: *** BCx: *** *** UCx: ***  *** Sputum: ***  *** MRSA PCR: ***  Thank you for allowing pharmacy to be a part of this patient's care.  [name] [name] 02/13/2023 1:49 PM

## 2023-02-14 ENCOUNTER — Encounter (HOSPITAL_COMMUNITY): Payer: Self-pay | Admitting: Internal Medicine

## 2023-02-14 DIAGNOSIS — J90 Pleural effusion, not elsewhere classified: Secondary | ICD-10-CM | POA: Diagnosis not present

## 2023-02-14 DIAGNOSIS — M00272 Other streptococcal arthritis, left ankle and foot: Secondary | ICD-10-CM | POA: Diagnosis not present

## 2023-02-14 DIAGNOSIS — I509 Heart failure, unspecified: Secondary | ICD-10-CM | POA: Diagnosis not present

## 2023-02-14 DIAGNOSIS — I5022 Chronic systolic (congestive) heart failure: Secondary | ICD-10-CM

## 2023-02-14 DIAGNOSIS — R7989 Other specified abnormal findings of blood chemistry: Secondary | ICD-10-CM | POA: Diagnosis not present

## 2023-02-14 LAB — BODY FLUID CULTURE W GRAM STAIN

## 2023-02-14 LAB — COMPREHENSIVE METABOLIC PANEL
ALT: 191 U/L — ABNORMAL HIGH (ref 0–44)
AST: 213 U/L — ABNORMAL HIGH (ref 15–41)
Albumin: 2.6 g/dL — ABNORMAL LOW (ref 3.5–5.0)
Alkaline Phosphatase: 155 U/L — ABNORMAL HIGH (ref 38–126)
Anion gap: 14 (ref 5–15)
BUN: 80 mg/dL — ABNORMAL HIGH (ref 8–23)
CO2: 15 mmol/L — ABNORMAL LOW (ref 22–32)
Calcium: 8.2 mg/dL — ABNORMAL LOW (ref 8.9–10.3)
Chloride: 113 mmol/L — ABNORMAL HIGH (ref 98–111)
Creatinine, Ser: 2.64 mg/dL — ABNORMAL HIGH (ref 0.61–1.24)
GFR, Estimated: 23 mL/min — ABNORMAL LOW (ref >60)
Glucose, Bld: 87 mg/dL (ref 70–99)
Potassium: 5.3 mmol/L — ABNORMAL HIGH (ref 3.5–5.1)
Sodium: 142 mmol/L (ref 135–145)
Total Bilirubin: 1.1 mg/dL (ref 0.3–1.2)
Total Protein: 5.7 g/dL — ABNORMAL LOW (ref 6.5–8.1)

## 2023-02-14 LAB — CBC
HCT: 25.2 % — ABNORMAL LOW (ref 39.0–52.0)
Hemoglobin: 7.5 g/dL — ABNORMAL LOW (ref 13.0–17.0)
MCH: 31.5 pg (ref 26.0–34.0)
MCHC: 29.8 g/dL — ABNORMAL LOW (ref 30.0–36.0)
MCV: 105.9 fL — ABNORMAL HIGH (ref 80.0–100.0)
Platelets: 143 10*3/uL — ABNORMAL LOW (ref 150–400)
RBC: 2.38 MIL/uL — ABNORMAL LOW (ref 4.22–5.81)
RDW: 23.9 % — ABNORMAL HIGH (ref 11.5–15.5)
WBC: 10.2 10*3/uL (ref 4.0–10.5)
nRBC: 0.5 % — ABNORMAL HIGH (ref 0.0–0.2)

## 2023-02-14 MED ORDER — LACTATED RINGERS IV BOLUS
250.0000 mL | Freq: Once | INTRAVENOUS | Status: AC
  Administered 2023-02-14: 250 mL via INTRAVENOUS

## 2023-02-14 MED ORDER — HALOPERIDOL 0.5 MG PO TABS: 0.5000 mg | ORAL_TABLET | ORAL | Status: DC | PRN

## 2023-02-14 MED ORDER — HALOPERIDOL LACTATE 5 MG/ML IJ SOLN: 0.5000 mg | INTRAMUSCULAR | Status: DC | PRN

## 2023-02-14 MED ORDER — HALOPERIDOL LACTATE 2 MG/ML PO CONC: 0.5000 mg | ORAL | Status: DC | PRN

## 2023-02-14 MED ORDER — FENTANYL CITRATE PF 50 MCG/ML IJ SOSY
25.0000 ug | PREFILLED_SYRINGE | INTRAMUSCULAR | Status: DC | PRN
  Administered 2023-02-14 – 2023-02-15 (×3): 25 ug via INTRAVENOUS
  Filled 2023-02-14 (×4): qty 1

## 2023-02-14 MED ORDER — LORAZEPAM 2 MG/ML IJ SOLN
1.0000 mg | INTRAMUSCULAR | Status: DC | PRN
  Administered 2023-02-15: 1 mg via INTRAVENOUS
  Filled 2023-02-14: qty 1

## 2023-02-14 MED ORDER — LORAZEPAM 1 MG PO TABS: 1.0000 mg | ORAL_TABLET | ORAL | Status: DC | PRN

## 2023-02-14 MED ORDER — LORAZEPAM 2 MG/ML PO CONC
1.0000 mg | ORAL | Status: DC | PRN
  Administered 2023-02-14 – 2023-02-15 (×2): 1 mg via SUBLINGUAL
  Filled 2023-02-14 (×2): qty 1

## 2023-02-14 MED ORDER — LACTATED RINGERS IV BOLUS: 1000.0000 mL | Freq: Once | INTRAVENOUS | Status: DC

## 2023-02-14 NOTE — Consult Note (Addendum)
Cardiology Consultation   Patient ID: [name] [name] MRN: [id number]; DOB: Aug 20, 1940  Admit date: 02/11/2023 Date of Consult: 02/14/2023  PCP:  [name] [name], [name] Coup, MD   [facility] Providers Cardiologist:  Lance Muss, MD   Plan transfer to Dr Anne Fu in the future      Patient Profile:   [name] [name] is a 82 y.o. 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 Criselda Peaches.  History of Present Illness:   [name] [name] with above past medical history presented to the ER 02/11/2023 for 1 week history of poor PO intake, nausea, vomiting, overall functional decline.  He is currently admitted for septic shock secondary to left ankle septic arthritis, hospital course has been complicated by multisystem organ failure as evidenced by AKI on CKD, transaminitis, electrolytes derangement, pleural effusion, CHF decompensation.  He underwent left ankle I&D by orthopedic surgery 02/12/2023, blood culture and joint culture no significant growth so far.  He was seen by ID and is currently maintained IV cefepime, Flagyl, daptomycin, and doxycycline.  He had increased shortness of breath with persistent low BP on 02/13/2023 morning.  Chest x-ray obtained at the time showed persistent moderate to large left pleural effusion appears increased in volume, mild  increased interstitial marking compatible with pulmonary vascular congestion.  He was felt in decompensated heart failure, IV fluids were held and he was given IV Lasix 40mg  x 1 on 02/13/2023.  He had urine output 501 mL over the past 24 hours.  He remains borderline hypotensive.  Cardiology is consulted today for further recommendation inotropic support.   Upon encounter, patient is sleeping. His wife and daughter are at bedside. Wife states patient has been hardly eating or drinking over the past week, had significant decline in health. He had pressure ulcers from previous prolonged hospitalization, all seem to be healing. He had left foot /ankle swelling before this admission. He does not complain a lot at baseline. He become confused since Sunday. He has not been maintaining much meaningful conversation with family.   Per chart review, he was admitted in 02/2009 with a large ascending thoracic aneurysm, aortic arch aneurysm and acute aortic insufficiency and underwent AVR of aortic root with tissue valve and ascending aorta and replacement of aortic arch to the left subclavian artery.  He suffered an acute MI in 02/2010 underwent DES to LCx.  He was later admitted with acute type III aortic dissection which was treated medically; however, he ultimately required carotid subclavian bypass and thoracic stent graft in 08/2013 due to worsening dissection. He also has a history of chronic combined CHF and permanent atrial fibrillation. He was admitted in 09/2019 with onset CHF. Echo showed LVEF of 25-30% with global hypokinesis. R/ LHC showed CTO of the RCA with collaterals and widely patent LCX stent with up to 50% narrowing distal to the stent. PCWP was elevated at 32 mmHg. 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 He  was started on GDMT with improvement in EF 45-50% on 06/01/2022.   He was admitted at [facility] from 10/29/2021 to 11/08/2022 for MRSA pneumonia and septic shock.  Echo showed LVEF of  40-45%, moderately enlarged RV with moderately reduced systolic function and moderately elevated PASP of 54 mmHg, severe biatrial enlargement, stable bioprosthetic aortic valve with mean gradient of 8 mmHg, mild to moderate MR, moderate to severe TR. It was unclear if he had a TEE at that time. He was discharged home on IV Cubicin and  PO Doxycycline.  He returned to the ER at [facility] shortly where CT showed large loculated left-sided pleural effusion, cavitary lesion of right upper lobe concerning for necrotizing pneumonia.  He was transferred to [facility] and had a prolonged course here requiring IV antibiotic, recurrent thoracentesis.  He could not tolerate TEE.  ID recommended 4 weeks of Zyvox followed by doxycycline suppression.  Cardiology was consulted during that admission for CHF and hypotension requiring midodrine, Lasix and GDMT were held due to hypotension.  He was ultimately discharged to [facility] and discharged home from CIR on 12/23/2022. He never resumed GDMT for CHF at home, BP had been low, he was continued on midodrine per wife reports. He has not followed up with cardiology outpatient since recent last discharge.      Past Medical History:  Diagnosis Date   Acute on chronic diastolic heart failure (HCC) 08/22/2013   Acute on chronic respiratory failure with hypoxia (HCC) 10/03/2019   Acute on chronic systolic heart failure (HCC) 05/11/2013   EF from 35% to 50-55% on 6/14 ECHO.    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 No evidence of dissection or endoleak.  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 No evidence of  abdominal aortic dissection.  Occluded proximal left subclav   Aortic valve insufficiency    Arthritis    "probably in my knees before they replaced them" (01/28/2015)   Atrial fibrillation (HCC) 05/21/2013   Atrial flutter (HCC)    Bradycardia 08/23/2013   CAD (coronary artery disease)    Chest pain 01/28/2015   CHF (congestive heart failure) (HCC)    Chronic diastolic heart failure (HCC) 01/28/2015   Descending aortic aneurysm (HCC)    DVT (deep venous thrombosis) (HCC)    ?LLE post knee surgery   Dyspnea 09/2019   HCAP (healthcare-associated pneumonia) 10/11/2013   History of blood transfusion    "after one of my knee surgeries"   HTN (hypertension)    Hyperlipidemia    Hypotension 12/17/2019   Incarcerated left inguinal hernia s/p repair 12/02/2019 12/01/2019   Insomnia 09/04/2013   Multiple fractures of ribs, right side, init for clos fx 09/04/2019   Near syncope 01/28/2015   Pressure injury of skin 10/06/2019   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 12/18/2019 12/17/2019   Recurrent UTI 10/06/2013   Shock circulatory (HCC) 10/07/2013   Thoracic aneurysm    Tobacco abuse    Urinary retention 10/06/2013   UTI (urinary tract infection) 10/06/2013    Past Surgical History:  Procedure Laterality Date   AORTIC VALVE REPLACEMENT  02/19/2009   AORTO-FEMORAL  BYPASS GRAFT  03/2010   ascending aortic and arch aneurysm repair/notes 04/09/2009   CARDIAC VALVE REPLACEMENT     CAROTID-SUBCLAVIAN BYPASS GRAFT Left 08/26/2013   Procedure: BYPASS GRAFT CAROTID-SUBCLAVIAN;  Surgeon: Nada Libman, MD;  Location: [facility] OR;  Service: Vascular;  Laterality: Left;   CATARACT EXTRACTION W/ INTRAOCULAR LENS  IMPLANT, BILATERAL Bilateral    ENDOVASCULAR STENT INSERTION N/A 08/26/2013   Procedure:  THORACIC STENT GRAFT INSERTION;  Surgeon: Nada Libman, MD;  Location: [facility] OR;  Service: Vascular;  Laterality: N/A;   EYE SURGERY     I & D EXTREMITY Left 02/12/2023   Procedure:  IRRIGATION AND DEBRIDEMENT, ANKLE;  Surgeon: Roby Lofts, MD;  Location: [facility] OR;  Service: Orthopedics;  Laterality: Left;   INGUINAL HERNIA REPAIR Left 12/02/2019   Procedure: REPAIR LEFT INGUINAL HERNIA WITH MESH;  Surgeon: Violeta Gelinas, MD;  Location: [facility] OR;  Service: General;  Laterality: Left;   INGUINAL HERNIA REPAIR Left 12/18/2019   Procedure: LAPAROSCOPIC ASSISTED REPAIR OF RECURRENT INCARCERATED LEFT INGUINAL HERNIA WITH MESH; LAPAOSCOPIC REPAIR OF SEROSAL TEAR;  Surgeon: Gaynelle Adu, MD;  Location: [facility] OR;  Service: General;  Laterality: Left;   INSERTION OF MESH Left 12/02/2019   Procedure: INSERTION OF MESH;  Surgeon: Violeta Gelinas, MD;  Location: [facility] OR;  Service: General;  Laterality: Left;   JOINT REPLACEMENT     KNEE ARTHROSCOPY Bilateral    REPLACEMENT TOTAL KNEE Bilateral    right groin lymphocele Right 03/29/2009   RIGHT/LEFT HEART CATH AND CORONARY ANGIOGRAPHY N/A 10/06/2019   Procedure: RIGHT/LEFT HEART CATH AND CORONARY ANGIOGRAPHY;  Surgeon: Lyn Records, MD;  Location: [facility] INVASIVE CV LAB;  Service: Cardiovascular;  Laterality: N/A;   THORACENTESIS  2010 X 2   TONSILLECTOMY       Home Medications:  Prior to Admission medications   Medication Sig Start Date End Date Taking? Authorizing Provider  acetaminophen (TYLENOL) 500 MG tablet Take 1 tablet (500 mg total) by mouth every 6 (six) hours as needed. 12/22/22   Love, Evlyn Kanner, PA-C  albuterol (PROVENTIL) (2.5 MG/3ML) 0.083% nebulizer solution Take by nebulization. 06/22/22   [provider]  amiodarone (PACERONE) 200 MG tablet Take 1 tablet (200 mg total) by mouth daily. 07/12/22   [name], MD  amoxicillin (AMOXIL) 500 MG capsule SMARTSIG:4 Capsule(s) By Mouth Once 01/30/23   [provider]  apixaban (ELIQUIS) 2.5 MG TABS tablet Take 1 tablet (2.5 mg total) by mouth 2 (two) times daily. 01/23/23   [name], MD  ascorbic acid (VITAMIN C) 500 MG tablet Take 1 tablet (500 mg total)  by mouth daily. 12/22/22   Love, Evlyn Kanner, PA-C  doxycycline (VIBRA-TABS) 100 MG tablet Take 1 tablet (100 mg total) by mouth every 12 (twelve) hours. 12/22/22   Love, Evlyn Kanner, PA-C  feeding supplement (ENSURE ENLIVE / ENSURE PLUS) LIQD Take 237 mLs by mouth 3 (three) times daily between meals. 12/08/22   Burnadette Pop, MD  ferrous sulfate (SV IRON) 325 (65 FE) MG tablet Take 1 tablet (325 mg total) by mouth daily with breakfast. 12/22/22   Love, Evlyn Kanner, PA-C  furosemide (LASIX) 20 MG tablet Take 20 mg by mouth daily as needed. 01/08/23   [provider]  lansoprazole (PREVACID) 30 MG capsule Take 30 mg by mouth daily. 01/30/23   [provider]  levocetirizine (XYZAL) 5 MG tablet Take 5 mg by mouth daily. 04/21/21   [provider]  linezolid (ZYVOX) 600  MG tablet Take 600 mg by mouth 2 (two) times daily. 02/07/23   [provider]  megestrol (MEGACE) 40 MG tablet Take 40 mg by mouth 2 (two) times daily. 01/09/23   [provider]  melatonin 5 MG TABS Take 1 tablet (5 mg total) by mouth at bedtime as needed. 12/22/22   Love, Evlyn Kanner, PA-C  midodrine (PROAMATINE) 10 MG tablet Take 1 tablet (10 mg total) by mouth 3 (three) times daily with meals. 02/07/23   [name], MD  Multiple Vitamin (MULTIVITAMIN WITH MINERALS) TABS tablet Take 1 tablet by mouth daily.    [provider]  ondansetron (ZOFRAN) 4 MG tablet Take 1 tablet (4 mg total) by mouth daily as needed for nausea or vomiting. 12/23/22 12/23/23  Kirsteins, Victorino Sparrow, MD  pantoprazole (PROTONIX) 40 MG tablet Take 1 tablet (40 mg total) by mouth 2 (two) times daily. 12/22/22   Love, Evlyn Kanner, PA-C  potassium chloride SA (KLOR-CON M) 20 MEQ tablet Take 20-40 mEq by mouth daily. 01/09/23   [provider]  saccharomyces boulardii (FLORASTOR) 250 MG capsule Take 1 capsule (250 mg total) by mouth 2 (two) times daily. 12/22/22   Love, Evlyn Kanner, PA-C  senna (SENOKOT) 8.6 MG TABS tablet Take 1  tablet (8.6 mg total) by mouth 2 (two) times daily. 12/22/22   Love, Evlyn Kanner, PA-C  tamsulosin (FLOMAX) 0.4 MG CAPS capsule Take 0.4 mg by mouth.    [provider]  traMADol (ULTRAM) 50 MG tablet Take 50 mg by mouth every 6 (six) hours as needed for moderate pain. 11/27/22   [provider]  Vitamin D, Ergocalciferol, (DRISDOL) 1.25 MG (50000 UNIT) CAPS capsule Take 1 capsule by mouth once a week 01/23/23   Raulkar, Drema Pry, MD    Inpatient Medications: Scheduled Meds:  amiodarone  200 mg Oral Daily   apixaban  2.5 mg Oral BID   aspirin  81 mg Oral Daily   doxycycline  100 mg Oral Q12H   feeding supplement  1 Container Oral TID BM   Gerhardt's butt cream   Topical BID   megestrol  40 mg Oral BID   midodrine  10 mg Oral TID WC   pantoprazole  40 mg Oral Daily   senna  1 tablet Oral BID   Continuous Infusions:  ceFEPime (MAXIPIME) IV Stopped (02/13/23 0952)   DAPTOmycin (CUBICIN) 600 mg in sodium chloride 0.9 % IVPB     metronidazole 500 mg (02/14/23 0606)   PRN Meds: acetaminophen, traMADol, trimethobenzamide  Allergies:    Allergies  Allergen Reactions   Lortab [Hydrocodone-Acetaminophen] Hives    No trouble breathing   Morphine And Codeine Hives   Other Other (See Comments)    Staples from surgery caused infection    Social History:   Social History   Socioeconomic History   Marital status: Married    Spouse name: Not on file   Number of children: Not on file   Years of education: Not on file   Highest education level: Not on file  Occupational History   Not on file  Tobacco Use   Smoking status: Former    Current packs/day: 0.00    Types: Cigarettes    Quit date: 04/16/1974    Years since quitting: 48.8   Smokeless tobacco: Never  Vaping Use   Vaping status: Never Used  Substance and Sexual Activity   Alcohol use: No   Drug use: No   Sexual activity: Not Currently  Other Topics Concern   Not on file  Social History Narrative   Not on  file   Social Determinants of Health   Financial Resource Strain: Not on file  Food Insecurity: No Food Insecurity (02/11/2023)   Hunger Vital Sign    Worried About Running Out of Food in the Last Year: Never true    Ran Out of Food in the Last Year: Never true  Transportation Needs: No Transportation Needs (02/11/2023)   PRAPARE - Administrator, Civil Service (Medical): No    Lack of Transportation (Non-Medical): No  Physical Activity: Not on file  Stress: Not on file  Social Connections: Not on file  Intimate Partner Violence: Not At Risk (02/11/2023)   Humiliation, Afraid, Rape, and Kick questionnaire    Fear of Current or Ex-Partner: No    Emotionally Abused: No    Physically Abused: No    Sexually Abused: No    Family History:    Family History  Problem Relation Age of Onset   Celiac disease Daughter    Aortic aneurysm Father    Hypertension Mother    Heart attack Neg Hx    Stroke Neg Hx      ROS:  Unable to obtain as patient is lethargic    Physical Exam/Data:   Vitals:   02/13/23 2311 02/14/23 0138 02/14/23 0412 02/14/23 0757  BP: (!) 110/55 (!) 110/91 (!) 90/54 92/73  Pulse:  96  87  Resp: 19 (!) 22 19 20   Temp: 97.8 F (36.6 C)  (!) 96.9 F (36.1 C)   TempSrc: Axillary  Axillary   SpO2:  95%  98%  Weight:      Height:        Intake/Output Summary (Last 24 hours) at 02/14/2023 1000 Last data filed at 02/14/2023 0413 Gross per 24 hour  Intake 287.65 ml  Output 502 ml  Net -214.35 ml      02/11/2023    1:08 PM 02/11/2023    7:07 AM 01/12/2023   10:05 AM  Last 3 Weights  Weight (lbs) 160 lb 11.5 oz 163 lb 12.8 oz 165 lb  Weight (kg) 72.9 kg 74.3 kg 74.844 kg     Body mass index is 23.06 kg/m.   Vitals:  Vitals:   02/14/23 0412 02/14/23 0757  BP: (!) 90/54 92/73  Pulse:  87  Resp: 19 20  Temp: (!) 96.9 F (36.1 C)   SpO2:  98%   General Appearance: In no apparent distress, lethargic  HEENT: Normocephalic, atraumatic.  Neck:  Supple, trachea midline, difficult assess JVD as patient is positioned oddly and not able to cooperate   Cardiovascular: Irregularly irregular, normal S1-S2,  no murmur Respiratory: Resting breathing unlabored, lungs sounds clear with fine crackles, diminished to auscultation bilaterally, no use of accessory muscles.  Gastrointestinal: Bowel sounds positive, abdomen soft Extremities: Able to move all extremities in bed without difficulty, no leg edema, foot in boots  Musculoskeletal: Normal muscle bulk and tone, mild muscular atrophy  Skin: Intact, warm, dry. No rashes Neurologic: Lethargic, opened eyes to sternum rub, moaning, no words Psychiatric: calm    EKG:  The EKG was personally reviewed and demonstrates:    EKG from 02/11/23 showed A fib with controlled ventricular rate 93 bpm, non specific IVCD   Telemetry:  Telemetry was personally reviewed and demonstrates:    A fib with ventricular rate of 80s    Relevant CV Studies:   Echocardiogram from 02/12/2023:   1.  Left ventricular ejection fraction, by estimation, is 30 to 35%. The  left ventricle has moderately decreased function. The left ventricle  demonstrates global hypokinesis. The left ventricular internal cavity size  was moderately dilated.   2. Right ventricular systolic function is moderately reduced. The right  ventricular size is moderately enlarged. There is moderately elevated  pulmonary artery systolic pressure. The estimated right ventricular  systolic pressure is 56.7 mmHg.   3. Left atrial size was severely dilated.   4. Right atrial size was severely dilated.   5. Moderate pleural effusion in the left lateral region.   6. The mitral valve is degenerative. Moderate mitral valve regurgitation.  No evidence of mitral stenosis. Moderate mitral annular calcification.   7. Tricuspid valve regurgitation is moderate.   8. The aortic valve has been repaired/replaced. Aortic valve  regurgitation is not visualized.  There is a unknown a valve present in the  aortic position. Procedure Date: 04/09/09.   9. The inferior vena cava is dilated in size with <50% respiratory  variability, suggesting right atrial pressure of 15 mmHg.   Echocardiogram 12/01/2022:  1. Left ventricular ejection fraction, by estimation, is 35 to 40%. The  left ventricle has moderately decreased function. The left ventricle  demonstrates global hypokinesis. There is mild asymmetric left ventricular  hypertrophy of the septal segment.  Left ventricular diastolic parameters are consistent with Grade III  diastolic dysfunction (restrictive).   2. Right ventricular systolic function is moderately reduced. The right  ventricular size is moderately enlarged. There is moderately elevated  pulmonary artery systolic pressure. The estimated right ventricular  systolic pressure is 56.5 mmHg.   3. Left atrial size was severely dilated.   4. Right atrial size was severely dilated.   5. The mitral valve is abnormal. Moderate mitral valve regurgitation.  Moderate mitral annular calcification.   6. The tricuspid valve is abnormal. Tricuspid valve regurgitation is  moderate.   7. 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 The aortic  valve has been repaired/replaced. Aortic valve regurgitation is not  visualized. 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 Procedure Date: 2010. Echo  findings are consistent with normal structure and function of the aortic  valve prosthesis. Aortic valve mean gradient measures 9.5 mmHg.   8. The pulmonic valve was abnormal.   9. The inferior vena cava is dilated in size with <50% respiratory  variability, suggesting right atrial pressure of 15 mmHg.    Laboratory Data:  High Sensitivity Troponin:  No results for input(s): "TROPONINIHS" in the last 720 hours.   Chemistry Recent Labs  Lab 02/12/23 0330 02/13/23 0725 02/14/23 0622  NA 143 141 142  K 5.4* 5.2*  5.3*  CL 111 116* 113*  CO2 15* 12* 15*  GLUCOSE 121* 110* 87  BUN 62* 69* 80*  CREATININE 2.19* 2.51* 2.64*  CALCIUM 8.6* 8.1* 8.2*  GFRNONAA 29* 25* 23*  ANIONGAP 17* 13 14    Recent Labs  Lab 02/12/23 0330 02/13/23 0725 02/14/23 0622  PROT 5.7* 5.8* 5.7*  ALBUMIN 2.4* 2.4* 2.6*  AST 243* 222* 213*  ALT 169* 191* 191*  ALKPHOS 164* 181* 155*  BILITOT 0.9 1.0 1.1   Lipids No results for input(s): "CHOL", "TRIG", "HDL", "LABVLDL", "LDLCALC", "CHOLHDL" in the last 168 hours.  Hematology Recent Labs  Lab 02/12/23 0330 02/13/23 0725 02/14/23 0622  WBC 8.5 10.1 10.2  RBC 2.50* 2.69* 2.38*  HGB 7.9* 8.5* 7.5*  HCT 26.6*  29.7* 25.2*  MCV 106.4* 110.4* 105.9*  MCH 31.6 31.6 31.5  MCHC 29.7* 28.6* 29.8*  RDW 23.9* 24.1* 23.9*  PLT 197 180 143*   Thyroid  Recent Labs  Lab 02/11/23 1355  TSH 6.904*    BNPNo results for input(s): "BNP", "PROBNP" in the last 168 hours.  DDimer No results for input(s): "DDIMER" in the last 168 hours.   Radiology/Studies:  DG CHEST PORT 1 VIEW  Result Date: 02/13/2023 CLINICAL DATA:  Shortness of breath, fatigue and altered mental status. EXAM: PORTABLE CHEST 1 VIEW COMPARISON:  CT 02/11/2023 FINDINGS: Previous median sternotomy. Stent graft repair of the descending thoracic aorta and aortic arch. Status post aortic valve replacement. Stable cardiac enlargement. Persistent moderate to large left pleural effusion appears increased in volume from prior exam. Decreased volume of right pleural effusion. Mild increase interstitial markings. No focal airspace opacities. Remote right posterior rib fractures. IMPRESSION: 1. Persistent moderate to large left pleural effusion appears increased in volume from prior exam. Decreased volume of right effusion. 2. Mild increase interstitial markings compatible with pulmonary vascular congestion. Electronically Signed   By: Signa Kell M.D.   On: 02/13/2023 06:44   ECHOCARDIOGRAM LIMITED  Result Date:  02/12/2023    ECHOCARDIOGRAM LIMITED REPORT   Patient Name:   [name] [name] Date of Exam: 02/12/2023 Medical Rec #:  [id number]         Height:       70.0 in Accession #:    [id number]        Weight:       160.7 lb Date of Birth:  09/18/1940         BSA:          1.902 m Patient Age:    36 years          BP:           90/48 mmHg Patient Gender: M                 HR:           88 bpm. Exam Location:  Inpatient Procedure: Limited Echo, Cardiac Doppler and Color Doppler Indications:     Mitral valve insufficiency I34.0  History:         Patient has prior history of Echocardiogram examinations, most                  recent 12/01/2022. CAD, Aortic Valve Disease and Mitral Valve                  Disease; Risk Factors:Hypertension.                  Aortic Valve: unknown a valve is present in the aortic                  position. Procedure Date: 04/09/09.  Sonographer:     Aron Baba Referring Phys:  [id number] DAN FLOYD Diagnosing Phys: Armanda Magic MD IMPRESSIONS  1. Left ventricular ejection fraction, by estimation, is 30 to 35%. The left ventricle has moderately decreased function. The left ventricle demonstrates global hypokinesis. The left ventricular internal cavity size was moderately dilated.  2. Right ventricular systolic function is moderately reduced. The right ventricular size is moderately enlarged. There is moderately elevated pulmonary artery systolic pressure. The estimated right ventricular systolic pressure is 56.7 mmHg.  3. Left atrial size was severely dilated.  4. Right atrial size was severely dilated.  5. Moderate pleural effusion in  the left lateral region.  6. The mitral valve is degenerative. Moderate mitral valve regurgitation. No evidence of mitral stenosis. Moderate mitral annular calcification.  7. Tricuspid valve regurgitation is moderate.  8. The aortic valve has been repaired/replaced. Aortic valve regurgitation is not visualized. There is a unknown a valve present in the aortic position.  Procedure Date: 04/09/09.  9. The inferior vena cava is dilated in size with <50% respiratory variability, suggesting right atrial pressure of 15 mmHg. FINDINGS  Left Ventricle: Left ventricular ejection fraction, by estimation, is 30 to 35%. The left ventricle has moderately decreased function. The left ventricle demonstrates global hypokinesis. The left ventricular internal cavity size was moderately dilated. There is no left ventricular hypertrophy. Right Ventricle: The right ventricular size is moderately enlarged. No increase in right ventricular wall thickness. Right ventricular systolic function is moderately reduced. There is moderately elevated pulmonary artery systolic pressure. The tricuspid  regurgitant velocity is 3.23 m/s, and with an assumed right atrial pressure of 15 mmHg, the estimated right ventricular systolic pressure is 56.7 mmHg. Left Atrium: Left atrial size was severely dilated. Right Atrium: Right atrial size was severely dilated. Pericardium: There is no evidence of pericardial effusion. Mitral Valve: The mitral valve is degenerative in appearance. There is mild calcification of the mitral valve leaflet(s). Moderate mitral annular calcification. Moderate mitral valve regurgitation. No evidence of mitral valve stenosis. Tricuspid Valve: The tricuspid valve is normal in structure. Tricuspid valve regurgitation is moderate . No evidence of tricuspid stenosis. Aortic Valve: The aortic valve has been repaired/replaced. Aortic valve regurgitation is not visualized. There is a unknown a valve present in the aortic position. Procedure Date: 04/09/09. Pulmonic Valve: The pulmonic valve was normal in structure. Pulmonic valve regurgitation is not visualized. No evidence of pulmonic stenosis. Aorta: The aortic root is normal in size and structure. Venous: The inferior vena cava is dilated in size with less than 50% respiratory variability, suggesting right atrial pressure of 15 mmHg. IAS/Shunts: No  atrial level shunt detected by color flow Doppler. Additional Comments: There is a moderate pleural effusion in the left lateral region. Spectral Doppler performed. Color Doppler performed.  LEFT VENTRICLE PLAX 2D LVIDd:         6.40 cm LVIDs:         4.90 cm LV PW:         1.00 cm LV IVS:        0.90 cm  LV Volumes (MOD) LV vol d, MOD A4C: 156.0 ml LV vol s, MOD A4C: 107.0 ml LV SV MOD A4C:     156.0 ml LEFT ATRIUM         Index LA diam:    6.10 cm 3.21 cm/m  MITRAL VALVE                 TRICUSPID VALVE MV Area (PHT): 8.92 cm      TR Peak grad:   41.7 mmHg MV Decel Time: 85 msec       TR Vmax:        323.00 cm/s MR Peak grad:   82.4 mmHg MR Mean grad:   51.0 mmHg MR Vmax:        454.00 cm/s MR Vmean:       333.0 cm/s MR PISA:        1.57 cm MR PISA Radius: 0.50 cm MV E velocity: 114.00 cm/s MV A velocity: 40.70 cm/s MV E/A ratio:  2.80 Armanda Magic MD Electronically signed by Armanda Magic MD  Signature Date/Time: 02/12/2023/12:39:43 PM    Final (Updated)    US Abdomen Limited RUQ (LIVER/GB)  Result Date: 02/11/2023 CLINICAL DATA:  Elevated liver enzymes EXAM: ULTRASOUND ABDOMEN LIMITED RIGHT UPPER QUADRANT COMPARISON:  CT 02/11/2023 noncontrast FINDINGS: Gallbladder: Gallbladder is mildly distended. There is sludge and stones. Gallbladder wall is thickened at 3.5 mm. Mild adjacent ascites. No reported sonographic Murphy's sign Common bile duct: Diameter: 2 mm Liver: No focal lesion identified. Within normal limits in parenchymal echogenicity. Portal vein is patent on color Doppler imaging with normal direction of blood flow towards the liver. Other: None. IMPRESSION: Gallbladder sludge and stones. Mild wall thickening but there is some mild adjacent ascites. No ductal dilatation. If there is further concern of acute cholecystitis, HIDA scan could be considered as clinically appropriate Electronically Signed   By: Karen Kays M.D.   On: 02/11/2023 17:25   MR ANKLE LEFT WO CONTRAST  Addendum Date: 02/11/2023    ADDENDUM REPORT: 02/11/2023 17:04 ADDENDUM: The original report was by Dr. Gaylyn Rong. The following addendum is by Dr. Gaylyn Rong: This addendum is to correct a typo in the voice recognition transcribed report. The final sentence under the "other" section in the body of the report should read: NO abscess identified. Electronically Signed   By: Gaylyn Rong M.D.   On: 02/11/2023 17:04   Result Date: 02/11/2023 CLINICAL DATA:  Ankle pain, query septic arthritis/osteomyelitis EXAM: MRI OF THE LEFT ANKLE WITHOUT CONTRAST TECHNIQUE: Multiplanar, multisequence MR imaging of the ankle was performed. No intravenous contrast was administered. COMPARISON:  Radiographs 02/11/2023 FINDINGS: Despite efforts by the technologist and patient, motion artifact is present on today's exam and could not be eliminated. This reduces exam sensitivity and specificity. TENDONS Peroneal: Peroneus brevis tendinopathy posterior to the lateral malleolus. Posteromedial: Torn flexor digitorum longus tendon, only well appreciated distal to the knot of Henry. Anterior: Expanded tibialis anterior tendon compatible tendinopathy. Trace metal artifact along the distal tendon, query prior surgical reattachment. Achilles: Fusiform Achilles contour with accentuated distal signal compatible with moderate distal Achilles tendinopathy. Plantar Fascia: Mild thickening of the medial band of the plantar fascia, cannot exclude mild plantar fasciitis. Plantar calcaneal spur noted. LIGAMENTS Lateral: Attenuated appearance of the anterior talofibular ligament suggesting a remote injury. Medial: Grossly unremarkable CARTILAGE Ankle Joint: Moderate tibiotalar joint effusion. Mild endosteal edema along the tibiotalar joint Subtalar Joints/Sinus Tarsi: Unremarkable Bones: Degenerative findings at the Lisfranc joint. Other: Subcutaneous edema along the anterior ankle and along the plantar and dorsal foot. Abscess identified. IMPRESSION: 1.  Moderate tibiotalar joint effusion with mild endosteal edema along the tibiotalar joint. This is nonspecific and could be degenerative or inflammatory, but septic arthritis is not difficult to totally exclude. There less synovitis than I would typically expect in septic arthritis. Correlate clinically in determining whether arthrocentesis is indicated. 2. Torn flexor digitorum longus tendon, only well appreciated distal to the knot of Henry. 3. Expanded tibialis anterior tendon compatible with tendinopathy. Trace metal artifact along the distal tendon, query prior surgical reattachment. 4. Moderate distal Achilles tendinopathy. 5. Mild thickening of the medial band of the plantar fascia, cannot exclude mild plantar fasciitis. 6. Peroneus brevis tendinopathy posterior to the lateral malleolus. 7. Attenuated appearance of the anterior talofibular ligament suggesting a remote injury. 8. Subcutaneous edema along the anterior ankle and along the plantar and dorsal foot. No abscess identified. Electronically Signed: By: Gaylyn Rong M.D. On: 02/11/2023 15:34   CT CHEST ABDOMEN PELVIS WO CONTRAST  Result Date: 02/11/2023 CLINICAL DATA:  Sepsis. EXAM: CT CHEST, ABDOMEN AND PELVIS WITHOUT CONTRAST TECHNIQUE: Multidetector CT imaging of the chest, abdomen and pelvis was performed following the standard protocol without IV contrast. RADIATION DOSE REDUCTION: This exam was performed according to the departmental dose-optimization program which includes automated exposure control, adjustment of the mA and/or kV according to patient size and/or use of iterative reconstruction technique. COMPARISON:  Chest CT on 11/28/2022, and AP CT on 09/20/2022 FINDINGS: CT CHEST FINDINGS Cardiovascular: Stable appearance of aortic valve replacement, tube graft repair of ascending aorta, and endovascular stent graft of the aortic arch and descending thoracic aorta. Stable moderate cardiomegaly. Mediastinum/Lymph Nodes: No masses or  pathologically enlarged lymph nodes identified on this unenhanced exam. Lungs/Pleura: Stable small to moderate bilateral pleural effusions and bilateral lower lobe atelectasis. Resolution of previously seen airspace disease in right upper and middle lobes since prior study. No new or increased areas of pulmonary opacity are seen. No No evidence of central endobronchial obstruction. Stable 4 mm pulmonary nodule in anterior right upper lobe on image 58/5. Musculoskeletal: No suspicious bone lesions identified. Multiple old right posterior rib fracture deformities are noted. 1 CT ABDOMEN AND PELVIS FINDINGS Hepatobiliary: No masses visualized on this unenhanced exam. Cholelithiasis. No radiographic evidence of cholecystitis. Pancreas: No mass or inflammatory changes identified on this unenhanced exam. Spleen:  Within normal limits in size. Adrenals/Urinary Tract: No evidence of renal or ureteral calculi, or hydronephrosis. Minimal radiodensity is seen along the left posterior bladder wall, suspicious for tiny bladder calculi. Stomach/Bowel: No evidence of obstruction, inflammatory process, or abnormal fluid collections. A large left inguinal hernia is seen which contains sigmoid colon. This is unchanged, and there is no evidence of bowel obstruction or strangulation. Diverticulosis is seen mainly involving the descending and sigmoid colon, however there is no evidence of diverticulitis. Normal appendix visualized. Vascular/Lymphatic: No pathologically enlarged lymph nodes identified. No abdominal aortic aneurysm. Reproductive:  Mildly enlarged prostate. Other:  None. Musculoskeletal: No suspicious bone lesions identified. Mild superior endplate compression fracture of the L1 vertebral body is seen which is new since prior exam. IMPRESSION: Stable small to moderate bilateral pleural effusions and bilateral lower lobe atelectasis. Resolution of previously seen airspace disease in right upper and middle lobes. No new or  increased areas of pulmonary opacity. Stable 4 mm right upper lobe pulmonary nodule. No follow-up needed if patient is low-risk. Non-contrast chest CT can be considered in 12 months if patient is high-risk. This recommendation follows the consensus statement: Guidelines for Management of Incidental Pulmonary Nodules Detected on CT Images: From the Fleischner Society 2017; Radiology 2017; 284:228-243. Cholelithiasis. No radiographic evidence of cholecystitis. Large left inguinal hernia containing sigmoid colon. No evidence of bowel obstruction or strangulation. Colonic diverticulosis, without radiographic evidence of diverticulitis. Mildly enlarged prostate. Probable tiny bladder calculi. No evidence of ureteral calculi or hydronephrosis. Mild L1 vertebral body compression fracture, new since prior study. Electronically Signed   By: Danae Orleans M.D.   On: 02/11/2023 15:15   DG Ankle Complete Left  Result Date: 02/11/2023 CLINICAL DATA:  Wound infection. EXAM: LEFT ANKLE COMPLETE - 3+ VIEW COMPARISON:  Left foot radiographs 11/20/2022 at [facility] FINDINGS: Soft tissue swelling is slightly more prominent medial than lateral. No acute or focal osseous abnormalities are present. A moderate-sized joint effusion is present. Mild osteopenia is noted. Plantar calcaneal spur is again seen. IMPRESSION: 1. Soft tissue swelling and moderate-sized joint effusion without acute or focal osseous abnormality. 2. Plantar calcaneal spur. 3. Mild osteopenia. Electronically Signed   By:  Marin Roberts M.D.   On: 02/11/2023 08:49   DG Chest Port 1 View  Result Date: 02/11/2023 CLINICAL DATA:  Left ankle wound infection.  Question sepsis. EXAM: PORTABLE CHEST 1 VIEW COMPARISON:  Two-view chest x-ray 12/22/2022 FINDINGS: Heart is enlarged. Aortic stent grafting noted. Left greater than right pleural effusions are again seen. Mild pulmonary vascular congestion is noted. Bibasilar airspace opacities are present, left  greater than right. Atherosclerotic calcifications are noted within the distal subclavian and axillary arteries. Degenerative changes are present in the shoulders, right greater than left. IMPRESSION: 1. Cardiomegaly and mild pulmonary vascular congestion without frank edema. 2. Left greater than right pleural effusions and bibasilar airspace disease, left greater than right. While this may represent atelectasis, infection is not excluded. Electronically Signed   By: Marin Roberts M.D.   On: 02/11/2023 08:34     Assessment and Plan:   Septic shock Disseminated MRSA  Left ankle septic arthritis Encephalopathy  Multiorgan system failure/AKI/Transaminitis  Anemia  - per primary team and ID, agree with goals of care discussion   Acute on chronic systolic and diastolic heart failure  - CXR from 8/27 showed increased/persistent moderate to large left pleural effusions and mild pulmonary congestion  - UOP with IV Lasix 40mg  x1 on 8/27 - BP is low normal which is unchanged from baseline since 10/2022  - He does not appear to be in low output CHF, clinically not overt overloaded today, inotropic support unlikely will offer long term benefit as current picture is driven by septic shock/multiorgan failure; consider IV albumin support for BP, IV Lasix as needed when volume overload occurs; consider thoracentesis for dyspnea relief if family wishes; consider nephrology input on dialysis/doubt he is good candidate; goals of care discussion is appropriate as he remains critically ill.  - GDMT: N/A due to low BP and AKI  Permanent  A Fib - rate is controlled at this time, may need to hold amiodarone if transaminitis worsen, unable to use AVN block agents given hypotension, digoxin is not safe in the setting of AKI    Risk Assessment/Risk Scores:  { New York Heart Association (NYHA) Functional Class NYHA Class II  CHA2DS2-VASc Score = 6   This indicates a 9.7% annual risk of stroke. The  patient's score is based upon: CHF History: 1 HTN History: 1 Diabetes History: 1 Stroke History: 0 Vascular Disease History: 1 Age Score: 2 Gender Score: 0      For questions or updates, please contact [facility] Please consult [email or web address] for contact info under    Signed, Cyndi Bender, NP  02/14/2023 10:00 AM  Discussed with primary team and family.  Plan is for hospice care and discontinuing meds including antibiotic.  Agree with this.  No role for aggressive inotropic therapy.

## 2023-02-14 NOTE — Progress Notes (Signed)
PT Cancellation Note  Patient Details Name: [name] [name] MRN: [id number] DOB: Oct 09, 1940   Cancelled Treatment:    Reason Eval/Treat Not Completed: Other (comment). Medical plan is now going in direction of comfort care. Pt with no further acute PT needs. Please re-consult if needed in future.  [name], PT, DPT Acute Rehabilitation Services Secure chat preferred Office #: [phone]    Iona Hansen 02/14/2023, 9:56 AM

## 2023-02-14 NOTE — Progress Notes (Signed)
Meet with patient and his family at bedside. Appreciate cardiology weighing in on case. Patient is getting worse on broad spectrum antibiotics. Unfortunately, he is not a surgical candidate. Today, he is sleepy and unable to tolerate PO intake. I reviewed clinical course and concern that he was not benefiting from current therapy. His family have a good understanding of his current condition and their primary goal is for him to not suffer.  They understand that he is at the end of his life.  P: Transition to comfort care Discontinue IV antibiotics and PO meds NPO for now, ok with eating for comfort

## 2023-02-14 NOTE — Progress Notes (Signed)
Heart Failure Navigator Progress Note  Assessed for Heart & Vascular TOC clinic readiness.  Patient does not meet criteria due to transitioning to comfort measures.   Navigator will sign off at this time.   Kaymon Denomme,RN, BSN,MSN Heart Failure Nurse Navigator. Contact by secure chat only.

## 2023-02-14 NOTE — Plan of Care (Signed)
  Problem: Clinical Measurements: Goal: Ability to maintain clinical measurements within normal limits will improve Outcome: Progressing Goal: Cardiovascular complication will be avoided Outcome: Progressing   Problem: Activity: Goal: Risk for activity intolerance will decrease Outcome: Not Progressing   Problem: Nutrition: Goal: Adequate nutrition will be maintained Outcome: Not Progressing   Problem: Elimination: Goal: Will not experience complications related to bowel motility Outcome: Progressing Goal: Will not experience complications related to urinary retention Outcome: Not Progressing

## 2023-02-14 NOTE — Progress Notes (Signed)
    [facility]          BRIEF PROGRESS NOTE  Noted recent decision to transition to comfort care and discontinue antibiotics.    Marcos Eke, NP [facility]  02/14/2023  2:33 PM

## 2023-02-14 NOTE — Progress Notes (Addendum)
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 intake nausea and vomiting and was admitted for suspected bacteremia.  He is now on hospital day 3.  NAEON. No medication refusals. Some vomiting yesterday: received PRN Tigan 200mg  x1 for nausea/vomiting. Discussed end of life with family. Per nursing note, would just "hold pill in his mouth."   On interview with wife and daughter at bedside, patient was very drowsy and unresponsive to voice.  He was minimally attentive after gentle touch.  Per wife, patient has not been eating very well and threw up his medications yesterday.  Family is aware that patient is likely approaching the end of his life.  They believe that patient does not have capacity to make healthcare decisions at this time, and would prefer to pass away without invasive measures.  Did not believe a palliative consult was necessary, as they have had this conversation at a previous hospital stay and arrived at this conclusion together.  They are aware that even if patient's status were to improve somewhat, they would likely be in the same position over the course of the next few weeks to months.  They were more wary about taking measures to allow for a natural death.  Family asked if we could reach out to [name] [name], his long time cardiologist, for recommendations about this.  Later in the day, patient took a sip of water only with extensive coaxing from family and nursing did not feel comfortable giving him his p.o. meds.  Made NPO at that time.  Objective:  BP: 90/54. HR: 96. T: 96.9 F. Satting 95% on RA. Low BP of 82/52 MAP 63 @600  this morning. Repeat: 92/73  Vital signs in last 24 hours: Vitals:   02/14/23 0138 02/14/23 0412 02/14/23 0757 02/14/23 1051  BP: (!) 110/91  (!) 90/54 92/73 104/68  Pulse: 96  87 88  Resp: (!) 22 19 20 17   Temp:  (!) 96.9 F (36.1 C) (!) 97 F (36.1 C) 97.7 F (36.5 C)  TempSrc:  Axillary Axillary Axillary  SpO2: 95%  98% 95%  Weight:      Height:       Physical exam:   Constitutional: Chronically ill-appearing man laying in bed, briefly opens his eyes to light touch, no acute distress Cardiovascular: Irregular rhythm, regular rate.  Respiratory: No increased work of breathing, reduced lung sounds at the left lung base. Abdominal: Bowel sounds present MSK: Lateral lower extremities with multiple sores, as before.  In SCDs.  Bandage over her left ankle joint and right toe. Neuro: Did not assess Psych: Did not assess   Pertinent labs (emphasis mine):   K: 5.3 (5.2)  BUN: 80 (69)  Creatinine 2.64 (2.51)  AST: 213 (222)  ALT: 191 (191) Alkaline phosphatase: 151 (181)  Bilirubin 1.1 (1.0)    Hemoglobin: 7.5 (8.5)  Platelets: 143 (180)    Blood cultures: No growth at 12 hours Body fluid cultures: No growth at 24 hours Ankle culture: Group B strep   Pertinent imaging (emphasis mine):   XR Chest (8/26):   IMPRESSION: 1. Cardiomegaly and mild pulmonary vascular congestion without frank edema. 2. Left greater than right pleural effusions and bibasilar airspace disease, left greater than right. While this may represent atelectasis, infection is not excluded.   Stat XR Chest (AM 8/27):  IMPRESSION: 1. Persistent moderate to large left pleural effusion appears increased in volume from prior exam. Decreased volume of right effusion. 2. Mild increase interstitial markings compatible with pulmonary vascular congestion.  Assessment/Plan:  Principal Problem:   Septic arthritis of left ankle (HCC) Active Problems:   Normocytic anemia   CAD (coronary artery disease)   S/P AVR   Hyperlipidemia   Pleural effusion   Atrial fibrillation (HCC)   Chronic diastolic heart failure (HCC)   Altered mental state    Acute hypoxic respiratory failure (HCC)   #Pleural effusions, worsening #CHF (30-35%), worsening #AKI, worsening #Elevated LFTs, worsening #Concern for Multi-system organ failure Kidney function continues to worsen this morning. LFTs remain poor.  Cardiology has evaluated and determined that patient would not benefit from inotropic therapy. [name] [name] will review patients chart and be in touch.  Albumin 25g/lasix 40mg  x1 bolus on afternoon of 8/27 appears to have failed to increase intravascular volume/perfusion. Original plan was to redose lasix at 80 mg in event that renal function fails to improve -- will not be possible in setting of falling pressures.  Was made n.p.o. status after it did not appear the patient could get pills down.  Prognosis is grim. - Patient was given a bolus of 250 mL for minimal volume repletion. - Cardiology consult placed for consideration of inotropic therapy. - Continue home midodrine 10 mg 3 times daily to increase perfusion. Could consider increase to 15 mg TID.  - Follow up with morning labs 8/29.   #Concern for bacteremia of unknown etiology #Hypotension #History of MRSA bacteremia #Concern for occult bleed Hemoglobin now 7.5 from 8.5. Will continue to monitor and replete if Hgb <7.0. Ankle cultures grew Group B streptococcus. Will defer to ID with regard to antibiotic narrowing.  - Hold continued IVF at this time in setting of hypervolemia. - Await blood/aspiration/surgical cultures.   ID recs as below: - Continue doxycycline 100 mg every 12 hours for pulmonary coverage (8/27 -) - Continue daptomycin 600 mg every 48 hours (8/27 -) - Continue IV cefepime 2 g daily (8/25 - ). - Continue IV Flagyl 500 mg every 12 hours (8/25 -). - Discontinued IV vancomycin 1 g every 36 hours (8/25 - 8/27).  #Goals of care Patient was made DNR/DNI on previous hospital admission.  Family declined palliative care consult as they have had this conversation before, and did  not feel as if it were necessary.  Palliative team made aware.   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 Will await ID recs. Wound care recs as below: - Cleanse wounds to left and right feet with soap and water and pat dry. - Paint wounds with betadine and dry gauze, secure with kerlix and tape.  Change daily.  - Cleanse sacral wound with NS and pat dry. Apply VASHE (LAWSON # [id number]) moist gauze to wound bed and secure with foam dressing. Change VASHE dressing daily and replace foam every three days and PRN soilage.     #Failure to thrive #Lethargy #Nausea/vomiting - Made NPO earlier today after refusing to eat and drink.  We will reevaluate later in the day. - Continue Boost breeze supplementation.   #History of aortic valve replacement #History of mitral insufficiency - TTE 8/26 showed no indication of valvular vegetative seeding or other infectious etiology, ruling this out as source of current bacteremia.   #Hyperkalemia K is 5.3.  Could give another dose of Lokelma if continues to rise.  - Follow up with morning labs 8/29.   #Atrial fibrillation #QTc prolongation Currently in Afib. Not in RPR. Qtc: 541. Avoid giving further Zofran and other QTc prolonging medications. - Continue home Eliquis 2.5 mg twice daily.   #Macrocytic anemia - In setting of poor PO. Folate and B12 levels within normal limits. Can be seen in setting of nucleated red blood cells.   Prior to Admission Living Arrangement: Home  Anticipated Discharge Location: SNF Barriers to Discharge: Acute multi-system organ failure  Dispo: Patient is very sick and is not medically stable for discharge at this time.  Tomie China, MD 02/14/2023, 11:35 AM Pager: [phone] After 5pm on weekdays and 1pm on weekends: On Call pager [phone]

## 2023-02-14 NOTE — Progress Notes (Signed)
Orthopaedic Trauma Progress Note  SUBJECTIVE: Resting comfortably in bed this morning.  No acute distress.  Opens eyes to voice.  Not currently following commands.  Intraoperative cultures growing strep B  OBJECTIVE:  Vitals:   02/14/23 0138 02/14/23 0412  BP: (!) 110/91 (!) 90/54  Pulse: 96   Resp: (!) 22 19  Temp:  (!) 96.9 F (36.1 C)  SpO2: 95%     General: Sleeping in bed.  No acute distress.  Opens eyes to voice Respiratory: No increased work of breathing.  Left lower extremity: Dressing clean, dry, intact.  Facial grimacing with gentle passive ankle motion.  Decreased sensation to the lower leg and foot at baseline.   LABS:  Results for orders placed or performed during the hospital encounter of 02/11/23 (from the past 24 hour(s))  Lab report - scanned     Status: None ()   Collection Time: 02/13/23  9:20 AM   Narrative   Ordered by an unspecified provider.  Lab report - scanned     Status: None   Collection Time: 02/13/23  9:20 AM   Narrative   Ordered by an unspecified provider.  Comprehensive metabolic panel     Status: Abnormal   Collection Time: 02/14/23  6:22 AM  Result Value Ref Range   Sodium 142 135 - 145 mmol/L   Potassium 5.3 (H) 3.5 - 5.1 mmol/L   Chloride 113 (H) 98 - 111 mmol/L   CO2 15 (L) 22 - 32 mmol/L   Glucose, Bld 87 70 - 99 mg/dL   BUN 80 (H) 8 - 23 mg/dL   Creatinine, Ser 4.09 (H) 0.61 - 1.24 mg/dL   Calcium 8.2 (L) 8.9 - 10.3 mg/dL   Total Protein 5.7 (L) 6.5 - 8.1 g/dL   Albumin 2.6 (L) 3.5 - 5.0 g/dL   AST 811 (H) 15 - 41 U/L   ALT 191 (H) 0 - 44 U/L   Alkaline Phosphatase 155 (H) 38 - 126 U/L   Total Bilirubin 1.1 0.3 - 1.2 mg/dL   GFR, Estimated 23 (L) >60 mL/min   Anion gap 14 5 - 15  CBC     Status: Abnormal   Collection Time: 02/14/23  6:22 AM  Result Value Ref Range   WBC 10.2 4.0 - 10.5 K/uL   RBC 2.38 (L) 4.22 - 5.81 MIL/uL   Hemoglobin 7.5 (L) 13.0 - 17.0 g/dL   HCT 91.4 (L) 78.2 - 95.6 %   MCV 105.9 (H) 80.0 - 100.0 fL    MCH 31.5 26.0 - 34.0 pg   MCHC 29.8 (L) 30.0 - 36.0 g/dL   RDW 21.3 (H) 08.6 - 57.8 %   Platelets 143 (L) 150 - 400 K/uL   nRBC 0.5 (H) 0.0 - 0.2 %    ASSESSMENT: [name] [name] is a 82 y.o. male, 2 Days Post-Op s/p IRRIGATION AND DEBRIDEMENT LEFT ANKLE  CV/Blood loss: Acute blood loss anemia.  Hemoglobin 7.5 this morning, down from yesterday.  PLAN: Weightbearing: WBAT LLE ROM: Unrestricted ROM Incisional and dressing care: Reinforce dressings as needed  Showering: Okay to begin getting incision wet LLE on 02/15/2023 Orthopedic device(s): None  Pain management: Continue current multimodal regimen, limit narcotics as able VTE prophylaxis:  Eliquis , SCDs ID: Per primary team and infectious disease Foley/Lines:  No foley, KVO IVFs  Dispo: PT/OT evaluations ongoing, currently recommending SNF.    Follow - up plan: 2 weeks after discharge for wound check, suture removal   Contact information:  Truitt Merle MD, Thyra Breed PA-C. After hours and holidays please check [email or web address] for group call information for [facility]   Thompson Caul, PA-C [phone] (office) [email or web address]

## 2023-02-15 DIAGNOSIS — M00272 Other streptococcal arthritis, left ankle and foot: Secondary | ICD-10-CM | POA: Diagnosis not present

## 2023-02-15 DIAGNOSIS — Z515 Encounter for palliative care: Secondary | ICD-10-CM | POA: Diagnosis not present

## 2023-02-15 NOTE — Progress Notes (Signed)
   This pt was referred for hospice services at [facility]. We have reviewed the chart and discussed with my MD- Dr. Romie Minus. He has been approved for hospice care at the [facility] house.   We come by room with no family present and have called phone in room and her home phone several times with no answer. We spoke to a grand daughter and got a cell phone number for the pt's wife as well and she has not answered this either. I spoke to Jess with SW to update her and she reports that we will can follow up in the morning.   Norm Parcel RN [phone]

## 2023-02-15 NOTE — TOC Progression Note (Signed)
Transition of Care [facility]) - Progression Note    Patient Details  Name: ZIHAN GIMBEL MRN: [id number] Date of Birth: April 12, 1941  [facility]) CM/SW Contact  Carley Hammed, LCSW Phone Number: 02/15/2023, 12:53 PM  Clinical Narrative:    CSW noted consult for residential hospice and met with spouse at bedside to confirm. Spouse notes a preference for [place] hospice home as it is close to their house. Referral made to [facility] who will review pt and speak with family to determine eligibility. TOC will continue to follow for DC needs.    Expected Discharge Plan:  (TBD) Barriers to Discharge: Continued Medical Work up (Disposition TBD)  Expected Discharge Plan and Services In-house Referral: Clinical Social Work     Living arrangements for the past 2 months: Single Family Home                                       Social Determinants of Health (SDOH) Interventions SDOH Screenings   Food Insecurity: No Food Insecurity (02/11/2023)  Housing: Patient Unable To Answer (02/11/2023)  Transportation Needs: No Transportation Needs (02/11/2023)  Utilities: Not At Risk (02/11/2023)  Depression (PHQ2-9): Low Risk  (04/13/2019)  Tobacco Use: Medium Risk (02/12/2023)    Readmission Risk Interventions     No data to display

## 2023-02-15 NOTE — Plan of Care (Signed)
  Problem: Coping: Goal: Level of anxiety will decrease Outcome: Progressing   Problem: Pain Managment: Goal: General experience of comfort will improve Outcome: Progressing   Problem: Safety: Goal: Ability to remain free from injury will improve Outcome: Progressing   

## 2023-02-15 NOTE — Discharge Instructions (Addendum)
[name] [name],  It was a pleasure taking care of you at [facility]. You were admitted for failure to thrive and nausea and treated for bacteremia.   I am sorry that we were unable to make you better. I sincerely hope that we were able to provide you with the best possible comfort while you were with [place] in this very difficult time. I have instructed the hospice providers to give you appropriate medications for anxiety, pain and secretions.    I hope you are able to spend the remainder of your time with your loving family fully and well.  Goodbye and best wishes,  Tomie China, MD

## 2023-02-15 NOTE — Progress Notes (Addendum)
   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 intake nausea and vomiting and was admitted for suspected bacteremia.  He is now on hospital day 4.  No acute events overnight.  Received as-needed IV fenanyl 25 mcg PRN for severe pain and as needed Ativan 2 mg for anxiety x2.  No medication refusals.  On interview with wife at bedside, patient is lying in bed, unresponsive to voice.  Wife says that he has been agitated recently requiring Ativan.  She understands that he is approaching the end of his life within days.  Agrees with hospice discharge to [facility] in [facility].  She appears more solemn today.  Objective:  Vital signs in last 24 hours: Vitals:   02/14/23 1051 02/14/23 1609 02/14/23 2011 02/15/23 0715  BP: 104/68 (!) 69/44 (!) 98/40 (!) 107/58  Pulse: 88 89 90 92  Resp: 17 17 17  (!) 21  Temp: 97.7 F (36.5 C) 97.9 F (36.6 C) 98.2 F (36.8 C) (!) 97.3 F (36.3 C)  TempSrc: Axillary Axillary Axillary Axillary  SpO2: 95% 92% 93% 92%  Weight:      Height:       Constitutional: Chronically ill-appearing man laying in bed, briefly opens his eyes to light touch, no acute distress Cardiovascular: Appears perfused. Respiratory: No increased work of breathing Abdominal: Deferred MSK: Deferred  Neuro: Deferred Psych: Deferred  Assessment/Plan:  Principal Problem:   Septic arthritis of left ankle (HCC) Active Problems:   Normocytic anemia   CAD (coronary artery disease)   S/P AVR   Hyperlipidemia   Pleural effusion   Atrial fibrillation (HCC)   Chronic diastolic heart failure (HCC)   Altered mental state   Acute hypoxic respiratory failure (HCC)  #End of Life Care #DNR/DNI Patient was made DNR/DNI on previous hospital admission.  Patient is now comfort  care. We have withdrawn scheduled medications. Comfort measures as follows:  - Switch from sublingual Ativan 2 mg every 4 hours to IV Ativan 1 mg every 4 hours as needed for anxiety/agitation. - Continue IV fentanyl 25 mcg every 30 minutes as needed for severe pain or dyspnea. - Continue IM Tigan 200 mg every 8 hours for nausea and vomiting. - NPO in setting of sedation.  Can eat for comfort.   #PAD complicated by multiple chronic wounds (bilateral lower extremity, sacral) #Joint effusion of the left ankle #Stage 2 sacral decubitus ulcer Wound care recs as below: - Cleanse wounds to left and right feet with soap and water and pat dry. - Paint wounds with betadine and dry gauze, secure with kerlix and tape.  Change daily.  - Cleanse sacral wound with NS and pat dry. Apply VASHE (LAWSON # [id number]) moist gauze to wound bed and secure with foam dressing. Change VASHE dressing daily and replace foam every three days and PRN soilage.    #Failure to thrive #Lethargy #Nausea/vomiting - Continue IM Tigan 200 mg every 8 hours for nausea and vomiting as above.    Prior to Admission Living Arrangement: Home Anticipated Discharge Location: [place] in [place] Barriers to Discharge: End-of-life Dispo:  Inpatient hospice, 1-2 days  Tomie China, MD 02/15/2023, 11:49 AM Pager: [phone] After 5pm on weekdays and 1pm on weekends: On Call pager [phone]

## 2023-02-16 DIAGNOSIS — M00272 Other streptococcal arthritis, left ankle and foot: Secondary | ICD-10-CM | POA: Diagnosis not present

## 2023-02-16 DIAGNOSIS — Z515 Encounter for palliative care: Secondary | ICD-10-CM

## 2023-02-16 LAB — TYPE AND SCREEN
ABO/RH(D): A POS
Antibody Screen: NEGATIVE
Unit division: 0

## 2023-02-16 LAB — CULTURE, BLOOD (ROUTINE X 2)
Culture: NO GROWTH
Culture: NO GROWTH
Special Requests: ADEQUATE
Special Requests: ADEQUATE

## 2023-02-16 LAB — GLUCOSE, CAPILLARY: Glucose-Capillary: 94 mg/dL (ref 70–99)

## 2023-02-16 LAB — ANAEROBIC CULTURE W GRAM STAIN

## 2023-02-16 LAB — BPAM RBC
Blood Product Expiration Date: 202409162359
Unit Type and Rh: 6200

## 2023-02-16 MED ORDER — HALOPERIDOL LACTATE 2 MG/ML PO CONC: 0.5000 mg | ORAL | Status: DC | PRN

## 2023-02-16 MED ORDER — HALOPERIDOL LACTATE 5 MG/ML IJ SOLN: 0.5000 mg | INTRAMUSCULAR | Status: DC | PRN

## 2023-02-16 MED ORDER — FENTANYL CITRATE PF 50 MCG/ML IJ SOSY: 25.0000 ug | PREFILLED_SYRINGE | INTRAMUSCULAR | Status: AC | PRN

## 2023-02-16 MED ORDER — HALOPERIDOL 0.5 MG PO TABS: 0.5000 mg | ORAL_TABLET | ORAL | Status: DC | PRN

## 2023-02-16 MED ORDER — LORAZEPAM 2 MG/ML IJ SOLN: 1.0000 mg | INTRAMUSCULAR | 0 refills | Status: DC | PRN

## 2023-02-16 MED ORDER — TRIMETHOBENZAMIDE HCL 100 MG/ML IM SOLN: 200.0000 mg | Freq: Three times a day (TID) | INTRAMUSCULAR | Status: DC | PRN

## 2023-02-16 MED ORDER — LORAZEPAM 1 MG PO TABS: 1.0000 mg | ORAL_TABLET | ORAL | Status: DC | PRN

## 2023-02-16 MED ORDER — LORAZEPAM 2 MG/ML PO CONC: 1.0000 mg | ORAL | 0 refills | Status: DC | PRN

## 2023-02-17 LAB — AEROBIC/ANAEROBIC CULTURE W GRAM STAIN (SURGICAL/DEEP WOUND)

## 2023-02-18 NOTE — Progress Notes (Addendum)
This RN called report to [facility]. Detailed report given to [facility]. Awaiting [facility] transport.  Patients family provided with paper copy of discharge . AVS placed in d/c packet and sent to facility.  IV left in place.  Patient belongings sent with patient family. Patient dc'd via [facility] to [facility] hospice.

## 2023-02-18 NOTE — TOC Transition Note (Signed)
Transition of Care [facility]) - CM/SW Discharge Note   Patient Details  Name: [name] [name] MRN: [id number] Date of Birth: 1940/09/22  [facility]) CM/SW Contact:  Carley Hammed, LCSW Phone Number: , 11:23 AM   Clinical Narrative:     Pt to be transported to [facility] via [place]. Nurse to call report to [phone] .  Final next level of care: Hospice Medical Facility Barriers to Discharge: Barriers Resolved   Patient Goals and CMS Choice CMS Medicare.gov Compare Post Acute Care list provided to:: Patient Represenative (must comment) Choice offered to / list presented to : Spouse  Discharge Placement                Patient chooses bed at:  [facility]) Patient to be transferred to facility by: [facility] Name of family member notified: [name] Patient and family notified of of transfer:   Discharge Plan and Services Additional resources added to the After Visit Summary for   In-house Referral: Clinical Social Work                                   Social Determinants of Health (SDOH) Interventions SDOH Screenings   Food Insecurity: No Food Insecurity (02/11/2023)  Housing: Patient Unable To Answer (02/11/2023)  Transportation Needs: No Transportation Needs (02/11/2023)  Utilities: Not At Risk (02/11/2023)  Depression (PHQ2-9): Low Risk  (04/13/2019)  Tobacco Use: Medium Risk (02/12/2023)     Readmission Risk Interventions     No data to display

## 2023-02-18 NOTE — Discharge Summary (Addendum)
Name: [name] [name] MRN: [id number] DOB: 12/11/1940 82 y.o. PCP: Street, [name], MD  Date of Admission: 02/11/2023  7:06 AM Date of Discharge:  Attending Physician: Dr. Criselda Peaches  Discharge Diagnosis: Principal Problem:   Septic arthritis of left ankle [facility]) Active Problems:   Normocytic anemia   CAD (coronary artery disease)   S/P AVR   Hyperlipidemia   Pleural effusion   Atrial fibrillation (HCC)   Chronic diastolic heart failure (HCC)   Altered mental state   Acute hypoxic respiratory failure [facility])   Hospice care patient   Palliative care patient    Discharge Medications: Allergies as of        Reactions   Lortab [hydrocodone-acetaminophen] Hives   No trouble breathing   Morphine And Codeine Hives   Other Other (See Comments)   Staples from surgery caused infection        Medication List     STOP taking these medications    acetaminophen 500 MG tablet Commonly known as: TYLENOL   albuterol (2.5 MG/3ML) 0.083% nebulizer solution Commonly known as: PROVENTIL   amiodarone 200 MG tablet Commonly known as: PACERONE   amoxicillin 500 MG capsule Commonly known as: AMOXIL   apixaban 2.5 MG Tabs tablet Commonly known as: ELIQUIS   ascorbic acid 500 MG tablet Commonly known as: VITAMIN C   doxycycline 100 MG tablet Commonly known as: VIBRA-TABS   feeding supplement Liqd   ferrous sulfate 325 (65 FE) MG tablet Commonly known as: SV Iron   furosemide 20 MG tablet Commonly known as: LASIX   lansoprazole 30 MG capsule Commonly known as: PREVACID   levocetirizine 5 MG tablet Commonly known as: XYZAL   linezolid 600 MG tablet Commonly known as: ZYVOX   megestrol 40 MG tablet Commonly known as: MEGACE   melatonin 5 MG Tabs   midodrine 10 MG tablet Commonly known as: PROAMATINE   multivitamin with minerals Tabs tablet   ondansetron 4 MG tablet Commonly known as: Zofran   pantoprazole 40 MG tablet Commonly  known as: PROTONIX   potassium chloride SA 20 MEQ tablet Commonly known as: KLOR-CON M   saccharomyces boulardii 250 MG capsule Commonly known as: FLORASTOR   senna 8.6 MG Tabs tablet Commonly known as: SENOKOT   tamsulosin 0.4 MG Caps capsule Commonly known as: FLOMAX   traMADol 50 MG tablet Commonly known as: ULTRAM   Vitamin D (Ergocalciferol) 1.25 MG (50000 UNIT) Caps capsule Commonly known as: DRISDOL       TAKE these medications    fentaNYL 50 MCG/ML injection Commonly known as: SUBLIMAZE Inject 0.5 mLs (25 mcg total) into the vein every 30 (thirty) minutes as needed for up to 7 days for severe pain (or dyspnea).   haloperidol 0.5 MG tablet Commonly known as: HALDOL Take 1 tablet (0.5 mg total) by mouth every 4 (four) hours as needed for agitation (or delirium).   haloperidol 2 MG/ML solution Commonly known as: HALDOL Place 0.3 mLs (0.6 mg total) under the tongue every 4 (four) hours as needed for agitation (or delirium).   haloperidol lactate 5 MG/ML injection Commonly known as: HALDOL Inject 0.1 mLs (0.5 mg total) into the vein every 4 (four) hours as needed (or delirium).   LORazepam 1 MG tablet Commonly known as: ATIVAN Take 1 tablet (1 mg total) by mouth every 4 (four) hours as needed for anxiety.   LORazepam 2 MG/ML concentrated solution Commonly known as: ATIVAN Place 0.5 mLs (1 mg total) under the tongue  every 4 (four) hours as needed for anxiety.   LORazepam 2 MG/ML injection Commonly known as: ATIVAN Inject 0.5 mLs (1 mg total) into the vein every 4 (four) hours as needed for anxiety.   trimethobenzamide 100 MG/ML injection Commonly known as: TIGAN Inject 2 mLs (200 mg total) into the muscle every 8 (eight) hours as needed.       Disposition and follow-up:   [name] [name] was discharged from [facility] in Serious condition.  At the hospital follow up visit please address:  1.  Follow-up:  a. Comfort: Ensure that  patient is receiving appropriate PRNs for nausea, pain, secretions, and nausea at the end of his life. Can feed for comfort.    2.  Labs / imaging needed at time of follow-up: none  3.  Pending labs/ test needing follow-up: none  4.  Medication Changes: none  Follow-up Appointments: None.   Hospital Course:  #Concern for bacteremia of unknown etiology #Hypotension #History of MRSA bacteremia #Concern for occult bleed Patient presented with 1 week of lethargy and failure to thrive and 1 day of nausea/vomiting in the setting of softer blood pressures.  Source of infection was thought to be related to joint effusion of the left ankle, vs open chronic right toe wound, versus mitral vegetations in setting of previous MRSA bacteremia.  Echocardiogram 8/27 was negative for vegetations.  Patient was begun on empiric very broad-spectrum antibiotics: Vancomycin, cefepime, Flagyl on 8/25 - later switched to daptomycin, cefepime, Flagyl with doxycycline for pulmonary coverage per ID recs.  Patient was afebrile throughout course of stay.  Left ankle culture returned GBS, antibiotics unchanged.  Antibiotics were discontinued after patient transitioned to comfort care 8/28.  #Pleural effusions, worsening #CHF (30-35%), worsening #AKI, worsening #Elevated LFTs, worsening #Concern for Multi-system organ failure Patient arrived with increased BUN/creatinine from baseline, elevated LFTs, and hyperkalemia to 6.1.  Patient was temporized calcium gluconate/insulin and given Lokelma for hyperkalemia.  Kidney function continued to worsen over the course of stay, despite volume repletion.  Pleural effusions worsened in the setting of more fluids per CXR, however patient never showed any prolonged increased work of breathing. Transthoracic echocardiogram on 8/27 showed decreased left ventricular ejection fraction to 30 to 35%, down from 35 to 40%, adjusting cardiac failure picture. LFTs remained poor  Albumin 25g/lasix  40mg  x1 bolus on afternoon of 8/27 failed to increase intravascular volume/perfusion. Original plan to redose lasix at 80 mg in event that renal function failed to improve was not possible in setting of falling pressures.  Was made n.p.o. status after it did not appear the patient could get pills down.  Cardiology evaluated and determined that patient would not benefit from inotropic therapy. Patient was placed on comfort care 8/28, and curative medications were removed from his regimen.  [name], patient's long-time cardiologist, visited and agreed with comfort care measures.  Family agreed that patient's condition is dire, and life-prolonging measures would likely decrease his quality of life.  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  Wound care was consulted to manage dressing changes for right ankle and sacral decubitus ulcer.  Likeliest source of patient's bacteremia.   #Failure to thrive #Lethargy #Nausea/vomiting Patient had failed to eat or drink water over the past week or so.  Per family, this was partly volitional and partly mechanical as he could not  always swallow competently, even after much urging.  Patient was made n.p.o. on 8/28 and asked after being unable to eat or drink.   #History of aortic valve replacement #History of mitral insufficiency TTE 8/26 showed no indication of valvular vegetative seeding or other infectious etiology, ruling this out as source of current bacteremia.   #Atrial fibrillation #QTc prolongation Patient remained in A-fib without RVR throughout admission.  Qtc: 541.  Avoid giving patient Zofran or other QTc prolonging medications over course of stay.   Discharge Subjective:  [name] [name] is an 82 year old male with a past medical history of A-fib on Eliquis, PAD with chronic lower  extremity and sacral wounds, recent MRSA bacteremia, aortic valve replacement and mitral insufficiency who presented with worsening p.o. intake nausea and vomiting and was admitted for suspected bacteremia.  He is now on hospital day 4.   No acute events overnight.  Received as-needed IV fenanyl 25 mcg PRN for severe pain and as needed Ativan 2 mg for anxiety x2.  No medication refusals.   On interview with wife at bedside, patient is lying in bed, unresponsive to voice.  Wife says that he has been agitated recently requiring Ativan.  She understands that he is approaching the end of his life within days.  Agrees with hospice discharge to [facility] in [facility].  Wife appears more solemn today.  Comfort provided as able.  Discharge Exam:   BP (!) 96/56 (BP Location: Right Arm)   Pulse 91   Temp 98.9 F (37.2 C) (Axillary)   Resp (!) 22   Ht 5\' 10"  (1.778 m)   Wt 160 lb 11.5 oz (72.9 kg)   SpO2 (!) 88%   BMI 23.06 kg/m   Constitutional: Chronically ill-appearing man laying in bed, briefly opens his eyes to light touch, no acute distress Cardiovascular: Appears perfused. Respiratory: No increased work of breathing Abdominal: Deferred MSK: Deferred  Neuro: Deferred Psych: Deferred  Pertinent Labs, Studies, and Procedures:     Latest Ref Rng & Units 02/14/2023    6:22 AM 02/13/2023    7:25 AM 02/12/2023    3:30 AM  CBC  WBC 4.0 - 10.5 K/uL 10.2  10.1  8.5   Hemoglobin 13.0 - 17.0 g/dL 7.5  8.5  7.9   Hematocrit 39.0 - 52.0 % 25.2  29.7  26.6   Platelets 150 - 400 K/uL 143  180  197        Latest Ref Rng & Units 02/14/2023    6:22 AM 02/13/2023    7:25 AM 02/12/2023    3:30 AM  CMP  Glucose 70 - 99 mg/dL 87  409  811   BUN 8 - 23 mg/dL 80  69  62   Creatinine 0.61 - 1.24 mg/dL 9.14  7.82  9.56   Sodium 135 - 145 mmol/L 142  141  143   Potassium 3.5 - 5.1 mmol/L 5.3  5.2  5.4   Chloride 98 - 111 mmol/L 113  116  111   CO2 22 - 32 mmol/L 15  12  15    Calcium 8.9 - 10.3  mg/dL 8.2  8.1  8.6   Total Protein 6.5 - 8.1 g/dL 5.7  5.8  5.7   Total Bilirubin 0.3 - 1.2 mg/dL 1.1  1.0  0.9   Alkaline Phos 38 - 126 U/L 155  181  164   AST 15 - 41 U/L 213  222  243   ALT 0 - 44 U/L 191  191  169     ECHOCARDIOGRAM LIMITED  Result Date: 02/12/2023    ECHOCARDIOGRAM LIMITED REPORT   Patient Name:   CAROL BELLUS Date of Exam: 02/12/2023 Medical Rec #:  [id number]         Height:       70.0 in Accession #:    [id number]        Weight:       160.7 lb Date of Birth:  06-17-1941         BSA:          1.902 m Patient Age:    60 years          BP:           90/48 mmHg Patient Gender: M                 HR:           88 bpm. Exam Location:  Inpatient Procedure: Limited Echo, Cardiac Doppler and Color Doppler Indications:     Mitral valve insufficiency I34.0  History:         Patient has prior history of Echocardiogram examinations, most                  recent 12/01/2022. CAD, Aortic Valve Disease and Mitral Valve                  Disease; Risk Factors:Hypertension.                  Aortic Valve: unknown a valve is present in the aortic                  position. Procedure Date: 04/09/09.  Sonographer:     Aron Baba Referring Phys:  [id number] DAN FLOYD Diagnosing Phys: Armanda Magic MD IMPRESSIONS  1. Left ventricular ejection fraction, by estimation, is 30 to 35%. The left ventricle has moderately decreased function. The left ventricle demonstrates global hypokinesis. The left ventricular internal cavity size was moderately dilated.  2. Right ventricular systolic function is moderately reduced. The right ventricular size is moderately enlarged. There is moderately elevated pulmonary artery systolic pressure. The estimated right ventricular systolic pressure is 56.7 mmHg.  3. Left atrial size was severely dilated.  4. Right atrial size was severely dilated.  5. Moderate pleural effusion in the left lateral region.  6. The mitral valve is degenerative. Moderate mitral valve regurgitation. No  evidence of mitral stenosis. Moderate mitral annular calcification.  7. Tricuspid valve regurgitation is moderate.  8. The aortic valve has been repaired/replaced. Aortic valve regurgitation is not visualized. There is a unknown a valve present in the aortic position. Procedure Date: 04/09/09.  9. The inferior vena cava is dilated in size with <50% respiratory variability, suggesting right atrial pressure of 15 mmHg. FINDINGS  Left Ventricle: Left ventricular ejection fraction, by estimation, is 30 to 35%. The left ventricle has moderately decreased function. The left ventricle demonstrates global hypokinesis. The left ventricular internal cavity size was moderately dilated. There is no left ventricular hypertrophy. Right Ventricle: The right ventricular size is moderately enlarged. No increase in right ventricular wall thickness. Right ventricular systolic function is moderately reduced. There is moderately elevated pulmonary artery systolic pressure. The tricuspid  regurgitant velocity is 3.23 m/s, and with an assumed right atrial pressure of 15 mmHg, the estimated right ventricular systolic pressure is 56.7 mmHg. Left Atrium: Left atrial size was severely dilated. Right Atrium: Right atrial size was severely  dilated. Pericardium: There is no evidence of pericardial effusion. Mitral Valve: The mitral valve is degenerative in appearance. There is mild calcification of the mitral valve leaflet(s). Moderate mitral annular calcification. Moderate mitral valve regurgitation. No evidence of mitral valve stenosis. Tricuspid Valve: The tricuspid valve is normal in structure. Tricuspid valve regurgitation is moderate . No evidence of tricuspid stenosis. Aortic Valve: The aortic valve has been repaired/replaced. Aortic valve regurgitation is not visualized. There is a unknown a valve present in the aortic position. Procedure Date: 04/09/09. Pulmonic Valve: The pulmonic valve was normal in structure. Pulmonic valve  regurgitation is not visualized. No evidence of pulmonic stenosis. Aorta: The aortic root is normal in size and structure. Venous: The inferior vena cava is dilated in size with less than 50% respiratory variability, suggesting right atrial pressure of 15 mmHg. IAS/Shunts: No atrial level shunt detected by color flow Doppler. Additional Comments: There is a moderate pleural effusion in the left lateral region. Spectral Doppler performed. Color Doppler performed.  LEFT VENTRICLE PLAX 2D LVIDd:         6.40 cm LVIDs:         4.90 cm LV PW:         1.00 cm LV IVS:        0.90 cm  LV Volumes (MOD) LV vol d, MOD A4C: 156.0 ml LV vol s, MOD A4C: 107.0 ml LV SV MOD A4C:     156.0 ml LEFT ATRIUM         Index LA diam:    6.10 cm 3.21 cm/m  MITRAL VALVE                 TRICUSPID VALVE MV Area (PHT): 8.92 cm      TR Peak grad:   41.7 mmHg MV Decel Time: 85 msec       TR Vmax:        323.00 cm/s MR Peak grad:   82.4 mmHg MR Mean grad:   51.0 mmHg MR Vmax:        454.00 cm/s MR Vmean:       333.0 cm/s MR PISA:        1.57 cm MR PISA Radius: 0.50 cm MV E velocity: 114.00 cm/s MV A velocity: 40.70 cm/s MV E/A ratio:  2.80 Armanda Magic MD Electronically signed by Armanda Magic MD Signature Date/Time: 02/12/2023/12:39:43 PM    Final (Updated)    US Abdomen Limited RUQ (LIVER/GB)  Result Date: 02/11/2023 CLINICAL DATA:  Elevated liver enzymes EXAM: ULTRASOUND ABDOMEN LIMITED RIGHT UPPER QUADRANT COMPARISON:  CT 02/11/2023 noncontrast FINDINGS: Gallbladder: Gallbladder is mildly distended. There is sludge and stones. Gallbladder wall is thickened at 3.5 mm. Mild adjacent ascites. No reported sonographic Murphy's sign Common bile duct: Diameter: 2 mm Liver: No focal lesion identified. Within normal limits in parenchymal echogenicity. Portal vein is patent on color Doppler imaging with normal direction of blood flow towards the liver. Other: None. IMPRESSION: Gallbladder sludge and stones. Mild wall thickening but there is some  mild adjacent ascites. No ductal dilatation. If there is further concern of acute cholecystitis, HIDA scan could be considered as clinically appropriate Electronically Signed   By: Karen Kays M.D.   On: 02/11/2023 17:25   MR ANKLE LEFT WO CONTRAST  Addendum Date: 02/11/2023   ADDENDUM REPORT: 02/11/2023 17:04 ADDENDUM: The original report was by Dr. Gaylyn Rong. The following addendum is by Dr. Gaylyn Rong: This addendum is to correct a typo in the voice recognition transcribed  report. The final sentence under the "other" section in the body of the report should read: NO abscess identified. Electronically Signed   By: Gaylyn Rong M.D.   On: 02/11/2023 17:04   Result Date: 02/11/2023 CLINICAL DATA:  Ankle pain, query septic arthritis/osteomyelitis EXAM: MRI OF THE LEFT ANKLE WITHOUT CONTRAST TECHNIQUE: Multiplanar, multisequence MR imaging of the ankle was performed. No intravenous contrast was administered. COMPARISON:  Radiographs 02/11/2023 FINDINGS: Despite efforts by the technologist and patient, motion artifact is present on today's exam and could not be eliminated. This reduces exam sensitivity and specificity. TENDONS Peroneal: Peroneus brevis tendinopathy posterior to the lateral malleolus. Posteromedial: Torn flexor digitorum longus tendon, only well appreciated distal to the knot of Henry. Anterior: Expanded tibialis anterior tendon compatible tendinopathy. Trace metal artifact along the distal tendon, query prior surgical reattachment. Achilles: Fusiform Achilles contour with accentuated distal signal compatible with moderate distal Achilles tendinopathy. Plantar Fascia: Mild thickening of the medial band of the plantar fascia, cannot exclude mild plantar fasciitis. Plantar calcaneal spur noted. LIGAMENTS Lateral: Attenuated appearance of the anterior talofibular ligament suggesting a remote injury. Medial: Grossly unremarkable CARTILAGE Ankle Joint: Moderate tibiotalar joint  effusion. Mild endosteal edema along the tibiotalar joint Subtalar Joints/Sinus Tarsi: Unremarkable Bones: Degenerative findings at the Lisfranc joint. Other: Subcutaneous edema along the anterior ankle and along the plantar and dorsal foot. Abscess identified. IMPRESSION: 1. Moderate tibiotalar joint effusion with mild endosteal edema along the tibiotalar joint. This is nonspecific and could be degenerative or inflammatory, but septic arthritis is not difficult to totally exclude. There less synovitis than I would typically expect in septic arthritis. Correlate clinically in determining whether arthrocentesis is indicated. 2. Torn flexor digitorum longus tendon, only well appreciated distal to the knot of Henry. 3. Expanded tibialis anterior tendon compatible with tendinopathy. Trace metal artifact along the distal tendon, query prior surgical reattachment. 4. Moderate distal Achilles tendinopathy. 5. Mild thickening of the medial band of the plantar fascia, cannot exclude mild plantar fasciitis. 6. Peroneus brevis tendinopathy posterior to the lateral malleolus. 7. Attenuated appearance of the anterior talofibular ligament suggesting a remote injury. 8. Subcutaneous edema along the anterior ankle and along the plantar and dorsal foot. No abscess identified. Electronically Signed: By: Gaylyn Rong M.D. On: 02/11/2023 15:34   CT CHEST ABDOMEN PELVIS WO CONTRAST  Result Date: 02/11/2023 CLINICAL DATA:  Sepsis. EXAM: CT CHEST, ABDOMEN AND PELVIS WITHOUT CONTRAST TECHNIQUE: Multidetector CT imaging of the chest, abdomen and pelvis was performed following the standard protocol without IV contrast. RADIATION DOSE REDUCTION: This exam was performed according to the departmental dose-optimization program which includes automated exposure control, adjustment of the mA and/or kV according to patient size and/or use of iterative reconstruction technique. COMPARISON:  Chest CT on 11/28/2022, and AP CT on 09/20/2022  FINDINGS: CT CHEST FINDINGS Cardiovascular: Stable appearance of aortic valve replacement, tube graft repair of ascending aorta, and endovascular stent graft of the aortic arch and descending thoracic aorta. Stable moderate cardiomegaly. Mediastinum/Lymph Nodes: No masses or pathologically enlarged lymph nodes identified on this unenhanced exam. Lungs/Pleura: Stable small to moderate bilateral pleural effusions and bilateral lower lobe atelectasis. Resolution of previously seen airspace disease in right upper and middle lobes since prior study. No new or increased areas of pulmonary opacity are seen. No No evidence of central endobronchial obstruction. Stable 4 mm pulmonary nodule in anterior right upper lobe on image 58/5. Musculoskeletal: No suspicious bone lesions identified. Multiple old right posterior rib fracture deformities are noted. 1 CT ABDOMEN  AND PELVIS FINDINGS Hepatobiliary: No masses visualized on this unenhanced exam. Cholelithiasis. No radiographic evidence of cholecystitis. Pancreas: No mass or inflammatory changes identified on this unenhanced exam. Spleen:  Within normal limits in size. Adrenals/Urinary Tract: No evidence of renal or ureteral calculi, or hydronephrosis. Minimal radiodensity is seen along the left posterior bladder wall, suspicious for tiny bladder calculi. Stomach/Bowel: No evidence of obstruction, inflammatory process, or abnormal fluid collections. A large left inguinal hernia is seen which contains sigmoid colon. This is unchanged, and there is no evidence of bowel obstruction or strangulation. Diverticulosis is seen mainly involving the descending and sigmoid colon, however there is no evidence of diverticulitis. Normal appendix visualized. Vascular/Lymphatic: No pathologically enlarged lymph nodes identified. No abdominal aortic aneurysm. Reproductive:  Mildly enlarged prostate. Other:  None. Musculoskeletal: No suspicious bone lesions identified. Mild superior endplate  compression fracture of the L1 vertebral body is seen which is new since prior exam. IMPRESSION: Stable small to moderate bilateral pleural effusions and bilateral lower lobe atelectasis. Resolution of previously seen airspace disease in right upper and middle lobes. No new or increased areas of pulmonary opacity. Stable 4 mm right upper lobe pulmonary nodule. No follow-up needed if patient is low-risk. Non-contrast chest CT can be considered in 12 months if patient is high-risk. This recommendation follows the consensus statement: Guidelines for Management of Incidental Pulmonary Nodules Detected on CT Images: From the Fleischner Society 2017; Radiology 2017; 284:228-243. Cholelithiasis. No radiographic evidence of cholecystitis. Large left inguinal hernia containing sigmoid colon. No evidence of bowel obstruction or strangulation. Colonic diverticulosis, without radiographic evidence of diverticulitis. Mildly enlarged prostate. Probable tiny bladder calculi. No evidence of ureteral calculi or hydronephrosis. Mild L1 vertebral body compression fracture, new since prior study. Electronically Signed   By: Danae Orleans M.D.   On: 02/11/2023 15:15   DG Ankle Complete Left  Result Date: 02/11/2023 CLINICAL DATA:  Wound infection. EXAM: LEFT ANKLE COMPLETE - 3+ VIEW COMPARISON:  Left foot radiographs 11/20/2022 at [facility] FINDINGS: Soft tissue swelling is slightly more prominent medial than lateral. No acute or focal osseous abnormalities are present. A moderate-sized joint effusion is present. Mild osteopenia is noted. Plantar calcaneal spur is again seen. IMPRESSION: 1. Soft tissue swelling and moderate-sized joint effusion without acute or focal osseous abnormality. 2. Plantar calcaneal spur. 3. Mild osteopenia. Electronically Signed   By: Marin Roberts M.D.   On: 02/11/2023 08:49   DG Chest Port 1 View  Result Date: 02/11/2023 CLINICAL DATA:  Left ankle wound infection.  Question sepsis. EXAM:  PORTABLE CHEST 1 VIEW COMPARISON:  Two-view chest x-ray 12/22/2022 FINDINGS: Heart is enlarged. Aortic stent grafting noted. Left greater than right pleural effusions are again seen. Mild pulmonary vascular congestion is noted. Bibasilar airspace opacities are present, left greater than right. Atherosclerotic calcifications are noted within the distal subclavian and axillary arteries. Degenerative changes are present in the shoulders, right greater than left. IMPRESSION: 1. Cardiomegaly and mild pulmonary vascular congestion without frank edema. 2. Left greater than right pleural effusions and bibasilar airspace disease, left greater than right. While this may represent atelectasis, infection is not excluded. Electronically Signed   By: Marin Roberts M.D.   On: 02/11/2023 08:34     Discharge Instructions:  Mr. Marmolejos,  It was a pleasure taking care of you at [facility]. You were admitted for failure to thrive and nausea and treated for bacteremia.   I am sorry that we were unable to make you better. I sincerely hope  that we were able to provide you with the best possible comfort while you were with [place] in this very difficult time. I have instructed the hospice providers to give you appropriate medications for anxiety, pain and secretions.    I hope you are able to spend the remainder of your time with your loving family fully and well.  Goodbye and best wishes,  Tomie China, MD   Signed: Tomie China, MD , 10:54 AM   Pager: [phone]  Internal Medicine Attending Note:  I was physically present during the key portions of the resident provided discharge service and participated in medical decision making of the patient's management care in the assessment and plan. I spent 35 minutes in coordination of care for this person's discharge.    Debe Coder, MD

## 2023-02-18 NOTE — Plan of Care (Signed)
  Problem: Education: Goal: Knowledge of General Education information will improve Description: Including pain rating scale, medication(s)/side effects and non-pharmacologic comfort measures Outcome: Not Progressing   Problem: Health Behavior/Discharge Planning: Goal: Ability to manage health-related needs will improve Outcome: Not Progressing   Problem: Clinical Measurements: Goal: Ability to maintain clinical measurements within normal limits will improve Outcome: Not Progressing Goal: Will remain free from infection Outcome: Not Progressing Goal: Diagnostic test results will improve Outcome: Not Progressing Goal: Respiratory complications will improve Outcome: Not Progressing Goal: Cardiovascular complication will be avoided Outcome: Not Progressing   Problem: Activity: Goal: Risk for activity intolerance will decrease Outcome: Not Progressing   Problem: Nutrition: Goal: Adequate nutrition will be maintained Outcome: Not Progressing   Problem: Coping: Goal: Level of anxiety will decrease Outcome: Not Progressing   Problem: Elimination: Goal: Will not experience complications related to bowel motility Outcome: Not Progressing Goal: Will not experience complications related to urinary retention Outcome: Not Progressing   Problem: Pain Managment: Goal: General experience of comfort will improve Outcome: Not Progressing   Problem: Safety: Goal: Ability to remain free from injury will improve Outcome: Not Progressing   Problem: Skin Integrity: Goal: Risk for impaired skin integrity will decrease Outcome: Not Progressing   Problem: Education: Goal: Knowledge of the prescribed therapeutic regimen will improve Outcome: Not Progressing   Problem: Coping: Goal: Ability to identify and develop effective coping behavior will improve Outcome: Not Progressing   Problem: Clinical Measurements: Goal: Quality of life will improve Outcome: Not Progressing   Problem:  Respiratory: Goal: Verbalizations of increased ease of respirations will increase Outcome: Not Progressing   Problem: Role Relationship: Goal: Family's ability to cope with current situation will improve Outcome: Not Progressing Goal: Ability to verbalize concerns, feelings, and thoughts to partner or family member will improve Outcome: Not Progressing   Problem: Pain Management: Goal: Satisfaction with pain management regimen will improve Outcome: Not Progressing

## 2023-02-18 DEATH — deceased

## 2023-03-05 ENCOUNTER — Other Ambulatory Visit: Payer: Medicare Other

## 2023-03-05 ENCOUNTER — Ambulatory Visit: Payer: Medicare Other | Admitting: Cardiothoracic Surgery

## 2023-05-01 ENCOUNTER — Ambulatory Visit: Payer: Medicare Other | Admitting: Cardiology
